# Patient Record
Sex: Male | Born: 1940
Health system: Southern US, Community
[De-identification: ages and names within clinical notes are randomized; demographics above are authoritative.]

## PROBLEM LIST (undated history)

## (undated) DIAGNOSIS — I739 Peripheral vascular disease, unspecified: Principal | ICD-10-CM

## (undated) DIAGNOSIS — K219 Gastro-esophageal reflux disease without esophagitis: Secondary | ICD-10-CM

## (undated) DIAGNOSIS — I2510 Atherosclerotic heart disease of native coronary artery without angina pectoris: Secondary | ICD-10-CM

## (undated) DIAGNOSIS — IMO0002 Reserved for concepts with insufficient information to code with codable children: Secondary | ICD-10-CM

## (undated) DIAGNOSIS — I779 Disorder of arteries and arterioles, unspecified: Secondary | ICD-10-CM

## (undated) DIAGNOSIS — E785 Hyperlipidemia, unspecified: Secondary | ICD-10-CM

## (undated) DIAGNOSIS — R001 Bradycardia, unspecified: Secondary | ICD-10-CM

## (undated) DIAGNOSIS — I10 Essential (primary) hypertension: Secondary | ICD-10-CM

## (undated) DIAGNOSIS — I509 Heart failure, unspecified: Secondary | ICD-10-CM

## (undated) DIAGNOSIS — Z720 Tobacco use: Secondary | ICD-10-CM

## (undated) DIAGNOSIS — B351 Tinea unguium: Secondary | ICD-10-CM

## (undated) DIAGNOSIS — E039 Hypothyroidism, unspecified: Secondary | ICD-10-CM

## (undated) DIAGNOSIS — L719 Rosacea, unspecified: Secondary | ICD-10-CM

## (undated) DIAGNOSIS — J449 Chronic obstructive pulmonary disease, unspecified: Secondary | ICD-10-CM

## (undated) DIAGNOSIS — E669 Obesity, unspecified: Secondary | ICD-10-CM

## (undated) DIAGNOSIS — N183 Chronic kidney disease, stage 3 unspecified: Secondary | ICD-10-CM

## (undated) DIAGNOSIS — Z8719 Personal history of other diseases of the digestive system: Secondary | ICD-10-CM

## (undated) DIAGNOSIS — Z9889 Other specified postprocedural states: Secondary | ICD-10-CM

## (undated) HISTORY — PX: TONSILLECTOMY: SUR1361

## (undated) HISTORY — DX: Bradycardia, unspecified: R00.1

## (undated) HISTORY — DX: Reserved for concepts with insufficient information to code with codable children: IMO0002

## (undated) HISTORY — PX: CARDIAC SURGERY: SHX584

## (undated) HISTORY — DX: Disorder of arteries and arterioles, unspecified: I77.9

## (undated) HISTORY — PX: COLON RESECTION: SHX5231

## (undated) HISTORY — DX: Peripheral vascular disease, unspecified: I73.9

## (undated) HISTORY — PX: APPENDECTOMY: SHX54

## (undated) HISTORY — DX: Tinea unguium: B35.1

## (undated) HISTORY — DX: Rosacea, unspecified: L71.9

## (undated) HISTORY — DX: Gastro-esophageal reflux disease without esophagitis: K21.9

## (undated) HISTORY — DX: Essential (primary) hypertension: I10

## (undated) HISTORY — DX: Atherosclerotic heart disease of native coronary artery without angina pectoris: I25.10

## (undated) HISTORY — DX: Other specified postprocedural states: Z98.890

## (undated) HISTORY — PX: CAROTID ENDARTERECTOMY: SUR193

---

## 2000-06-11 ENCOUNTER — Other Ambulatory Visit: Admission: RE | Admit: 2000-06-11 | Discharge: 2000-06-11 | Payer: Self-pay | Admitting: Plastic Surgery

## 2000-12-14 ENCOUNTER — Inpatient Hospital Stay (HOSPITAL_COMMUNITY): Admission: EM | Admit: 2000-12-14 | Discharge: 2000-12-15 | Payer: Self-pay | Admitting: Emergency Medicine

## 2000-12-15 ENCOUNTER — Encounter: Payer: Self-pay | Admitting: Internal Medicine

## 2001-07-18 ENCOUNTER — Emergency Department (HOSPITAL_COMMUNITY): Admission: EM | Admit: 2001-07-18 | Discharge: 2001-07-18 | Payer: Self-pay

## 2001-08-06 ENCOUNTER — Encounter (INDEPENDENT_AMBULATORY_CARE_PROVIDER_SITE_OTHER): Payer: Self-pay | Admitting: *Deleted

## 2001-08-06 ENCOUNTER — Ambulatory Visit (HOSPITAL_COMMUNITY): Admission: RE | Admit: 2001-08-06 | Discharge: 2001-08-06 | Payer: Self-pay | Admitting: Gastroenterology

## 2002-08-29 HISTORY — PX: CARDIAC CATHETERIZATION: SHX172

## 2002-09-15 DIAGNOSIS — Z9889 Other specified postprocedural states: Secondary | ICD-10-CM

## 2002-09-15 HISTORY — PX: CORONARY ANGIOPLASTY WITH STENT PLACEMENT: SHX49

## 2002-09-15 HISTORY — DX: Other specified postprocedural states: Z98.890

## 2002-09-26 ENCOUNTER — Inpatient Hospital Stay (HOSPITAL_COMMUNITY): Admission: EM | Admit: 2002-09-26 | Discharge: 2002-09-29 | Payer: Self-pay | Admitting: Emergency Medicine

## 2002-10-04 ENCOUNTER — Inpatient Hospital Stay (HOSPITAL_COMMUNITY): Admission: EM | Admit: 2002-10-04 | Discharge: 2002-10-07 | Payer: Self-pay | Admitting: Emergency Medicine

## 2002-10-10 ENCOUNTER — Inpatient Hospital Stay (HOSPITAL_COMMUNITY): Admission: EM | Admit: 2002-10-10 | Discharge: 2002-10-11 | Payer: Self-pay | Admitting: Emergency Medicine

## 2003-04-05 ENCOUNTER — Emergency Department (HOSPITAL_COMMUNITY): Admission: AD | Admit: 2003-04-05 | Discharge: 2003-04-06 | Payer: Self-pay | Admitting: Emergency Medicine

## 2003-09-16 HISTORY — PX: COLON SURGERY: SHX602

## 2003-12-29 ENCOUNTER — Emergency Department (HOSPITAL_COMMUNITY): Admission: EM | Admit: 2003-12-29 | Discharge: 2003-12-29 | Payer: Self-pay | Admitting: Emergency Medicine

## 2004-06-12 ENCOUNTER — Encounter: Admission: RE | Admit: 2004-06-12 | Discharge: 2004-06-12 | Payer: Self-pay | Admitting: Internal Medicine

## 2006-09-15 DIAGNOSIS — IMO0002 Reserved for concepts with insufficient information to code with codable children: Secondary | ICD-10-CM

## 2006-09-15 HISTORY — DX: Reserved for concepts with insufficient information to code with codable children: IMO0002

## 2007-03-29 ENCOUNTER — Encounter: Admission: RE | Admit: 2007-03-29 | Discharge: 2007-03-29 | Payer: Self-pay | Admitting: Internal Medicine

## 2008-09-26 ENCOUNTER — Encounter: Admission: RE | Admit: 2008-09-26 | Discharge: 2008-09-26 | Payer: Self-pay | Admitting: Family Medicine

## 2011-01-31 NOTE — H&P (Signed)
Albert Powell, Albert Powell NO.:  [id number]   MEDICAL RECORD NO.:  [id number]                   PATIENT TYPE:  INP   LOCATION:  1827                                 FACILITY:  [facility]   PHYSICIAN:  Danise Edge, M.D.                DATE OF BIRTH:  1941/06/03   DATE OF ADMISSION:  10/04/2002  DATE OF DISCHARGE:                                HISTORY & PHYSICAL   PROBLEM:  Gastrointestinal bleeding on aspirin and Plavix.   HISTORY:  The patient is a 70 year old male born Aug 29, 1941.  The patient  was admitted to [facility] from September 26, 2002 to September 29, 2002 to treat an acute lower gastrointestinal bleed, probably diverticular,  which stopped spontaneously.  Emergency esophagogastroduodenoscopy performed  during this hospitalization revealed a hiatal hernia and Schatzki's ring but  no signs of upper gastrointestinal bleeding.  The patient's discharge  hemoglobin was 8.4 g.   The patient developed acute nausea without vomiting, borborygmi and large-  volume hematochezia at 10 p.m., October 03, 2002.  He denies hematemesis,  chest pain, dyspnea or anorectal pain.  The patient requires daily aspirin  and Plavix to prevent coronary stent occlusion.   MEDICATION ALLERGIES:  None.   CHRONIC MEDICATIONS:  1. Prilosec 20 mg daily.  2. Aspirin 81 mg daily.  3. Plavix 75 mg daily.  4. Altace 10 mg b.i.d.  5. Zocor 40 mg daily.  6. Nicotine patch.   PAST MEDICAL HISTORY:  Coronary artery disease unassociated with prior  myocardial infarction; hypercholesterolemia; coronary artery stents placed;  September 26, 2002 esophagogastroduodenoscopy revealed hiatal hernia with  Schatzki's ring; hypertension; March 2002, diverticular colonic bleed;  umbilical herniorrhaphy.   HABITS:  The patient quit smoking cigarettes.  He consumes a couple of  glasses of wine most days of the week.   FAMILY HISTORY:  Negative for colon cancer.  His father  developed a  myocardial infarction.  His mother had diverticular bleeding.   SOCIAL HISTORY:  The patient is a married and his wife is at his bedside in  the [facility].   REVIEW OF SYSTEMS:  Despite history of coronary artery disease with coronary  artery stent placement, the patient denies dyspnea or chest pain.  There is  no past history of peptic ulcer disease.  Otherwise, negative review of  systems.   PHYSICAL EXAMINATION:  GENERAL APPEARANCE:  The patient is lying comfortably  on the stretcher in the emergency room.  VITAL SIGNS:  His blood pressure is low at 103/70.  HEENT:  Pale palpebral conjunctivae.  Nonicteric sclerae.  LUNGS:  Clear to auscultation.  CARDIAC:  Regular rhythm without audible murmurs, clicks or rubs.  ABDOMEN:  Soft, flat and nontender.  Abdominal obesity.  RECTAL:  Anorectal exam was not performed.  SKIN:  Skin warm and dry.  EXTREMITIES:  No edema.   ASSESSMENT:  Recurrent gastrointestinal bleeding manifested by hematochezia,  on aspirin and Plavix.                                               Danise Edge, M.D.    MJ/MEDQ  D:  10/04/2002  T:  10/04/2002  Job:  [id number]   cc:   Bernette Redbird, M.D.  [address] [place]  [place], [place] [address]  Fax: [phone]

## 2011-01-31 NOTE — Procedures (Signed)
[place]  Patient:    Albert Powell, Albert Powell Visit Number: [id number] MRN: [id number]          Service Type: END Location: ENDO Attending Physician:  Rich Brave Dictated by:   Florencia Reasons, M.D. Proc. Date: 08/06/01 Admit Date:  08/06/2001   CC:         Dr. Marvis Repress, Soyla Dryer FP   Procedure Report  PROCEDURE:  Colonoscopy with polypectomy.  COLONOSCOPIST:  Florencia Reasons, M.D.  HISTORY OF PRESENT ILLNESS:  This s a 70 year old gentleman who recently had low-grade outpatient GI bleeding felt to be diverticular in origin without significant post-hemorrhagic anemia.  Sigmoidoscopy in association with that bleeding episode which was about 10 days ago showed brown stool in the sigmoid region, and thus it is felt to have been hemorrhoidal in origin since he had prominent internal hemorrhoids.  However, the patient has a longstanding history of diverticular disease with periodic diverticulitis and had what was felt to be a diverticular hemorrhage in the spring of 2002.  FINDINGS:  Sessile polyp in the ascending colon.  Extensive diverticular disease.  PROCEDURE:  The nature and purpose and risks of the procedure have been reviewed with the patient who provided written consent.  Sedation was Fentanyl 50 mcg and Versed 5 mg IV to a level of mild sedation.  Heavy sedation was not administered due to a baseline bradycardia with a heart rate around 42 probably due to his atenolol.  Digital exam of the prostate was inhibited by the patients large body habitus.  The Olympus pediatric adjustable tension video colonoscope was advanced through a very tight sigmoid region with innumerable diverticula, and then with some looping overcome by having the patient in the right lateral decubitus position and applying external abdomen compression.  The cecum was identified by clear visualization of the appendiceal orifice after  which pullback was performed.  There was some slightly uncontrolled pullback in the proximal ascending colon, but it is not felt that any significant lesions would have been missed.  On the way in, I encountered a roughly 1 cm, very flat, sessile, pale polyp in the proximal ascending colon.  I biopsied it several times and there is a little bit of residual tissue fulgurated by the snare and a small piece of tissue was able to be sloughed off, but was never recovered for histologic analysis.  No other polyps were seen and there was no evidence of cancer, colitis, or vascular malformations.  The patient had pancolonic diverticular disease, but was most pronounced in the sigmoid area where there was a great deal of associated muscular thickening.  Retroflexion was not performed in the rectum.  The patient tolerated the procedure well and there were no apparent complications.  IMPRESSION: 1. A 1 cm, sessile, flat polyp biopsied and snared in the proximal ascending   colon. 2. Extensive diverticulosis as described above.  PLAN:  Await pathology on the polyp.  Consider followup colonoscopy in several years if adenomatous in character. Dictated by:   Florencia Reasons, M.D. Attending Physician:  Rich Brave DD:  08/06/01 TD:  08/07/01 Job: [id number]

## 2011-01-31 NOTE — Discharge Summary (Signed)
NAMESABIAN, KUBA NO.:  [id number]   MEDICAL RECORD NO.:  [id number]                   PATIENT TYPE:  INP   LOCATION:  3731                                 FACILITY:  [facility]   PHYSICIAN:  Bernette Redbird, M.D.                DATE OF BIRTH:  1941-07-17   DATE OF ADMISSION:  09/26/2002  DATE OF DISCHARGE:  09/29/2002                                 DISCHARGE SUMMARY   FINAL DIAGNOSES:  1. Acute diverticular hemorrhage (presumed).  2. Post-hemorrhagic anemia.  3. Prior history of diverticular bleeding.  4. Coronary artery disease, status post recent stenting.  5. Hypertension.   CONSULTATIONS:  None.   COMPLICATIONS:  None.   PROCEDURES:  Upper endoscopy.   LABORATORY DATA:  Admission hemoglobin was 10.4, which dropped to 8.4  following hydration and was 8.4 on the day of discharge, white count 6800.  Admission metabolic panel within normal limits except for albumin of 2.8 and  total protein of 5.0 and calcium of 8.3.  Specifically, BUN and creatinine  were normal on admission.   HOSPITAL COURSE:  The patient has a prior history (presumed) diverticular  hemorrhage, most recently about a year and a half ago, requiring  hospitalization on one occasion and an ER visit on another.  He began having  rather profuse bleeding, worse than ever before, on the morning of  admission, and developed presyncopal symptoms without chest pain or  abdominal pain.  He came to the emergency room.  Endoscopy was performed,  since he had recently been started on Plavix and was already on aspirin  (associated with recent coronary stent placement about a month earlier).  The endoscopy showed a small hiatal hernia with an esophageal ring but no  gastritis, erosions or ulcers or other source of bleeding.   The patient was admitted to a telemetry bed and observed.  He never had any  further bleeding or in fact even any bowel movements for the next 72 hours  while in  the hospital.  His diet was advanced and his blood pressure  remained stable, in fact, it was even a little bit high until we got his  Altace dose increased to the same dose he takes at home.   Given the absence of any further bleeding, the patient and I felt he was  clinically stable and ready to go home.   DISCHARGE MEDICATIONS:  The patient will resume all of his previous  outpatient medications including:  1. Plavix 75 mg q.a.m.  2. Aspirin 81 mg q.a.m.  3. Prilosec 20 mg q.a.m.  4. Nicotine patch 21 mg per 24 hours.  5. Altace 10 mg b.i.d.  6. Zocor 40 mg daily.  7. Canasa suppositories p.r.n.   ACTIVITY:  He may engage in light activity as tolerated and has no dietary  restrictions.   DISCHARGE INSTRUCTIONS:  He was carefully instructed to contact us  at the  first sign of any recurrent bleeding, rather than waiting to see if the  bleeding would persist or not, at least during this early post-hemorrhage  period.   FOLLOWUP:  He will follow up with me in the office in five days on January  20th at 12:30 p.m.   CONDITION ON DISCHARGE:  Improved.                                               Bernette Redbird, M.D.    RB/MEDQ  D:  09/29/2002  T:  09/29/2002  Job:  [id number]   cc:   [address] [place], [place]  [address]; Marvis Repress M.D., Fax  [phone]   [address] [place], [place]  [address] Milbert Coulter.D.

## 2011-01-31 NOTE — Discharge Summary (Signed)
NAMEANIL, HAVARD                          ACCOUNT NO.:  [id number]   MEDICAL RECORD NO.:  [id number]                   PATIENT TYPE:  INP   LOCATION:  3733                                 FACILITY:  [facility]   PHYSICIAN:  Bernette Redbird, M.D.                DATE OF BIRTH:  01-Oct-1940   DATE OF ADMISSION:  10/10/2002  DATE OF DISCHARGE:  10/11/2002                                 DISCHARGE SUMMARY   FINAL DIAGNOSES:  1. Recurrent lower GI bleeding, presumably of diverticular origin.  2. Post-hemorrhagic anemia.  3. Coronary artery disease, status post recent stenting.  4. Hypercholesterolemia.  5. Hypertension.   COMPLICATIONS:  None.   CONSULTATIONS:  None.   PROCEDURE:  Transfusion of 3 units of packed cells.   LABORATORY DATA:  Admission hemoglobin was 7.4.  After 2 units of packed red  blood cells, it came up to 8.  BUN 19, creatinine 0.8.   HISTORY OF PRESENT ILLNESS:  The patient was admitted through the emergency  room when he began having hematochezia associated with mild dizziness (no  syncope) at home.  He had just been discharged from the hospital three days  earlier.  In fact, this is his third hospitalization in the past three  weeks.  Please see previous dictated discharge summaries for further  details, but basically, this patient has a history of recurrent lower GI  bleeding, presumably of diverticular origin beginning in 3/02, requiring  hospitalization, but not transfusion at that time.  He had a couple of minor  episodes of bleeding, possibly of hemorrhoidal origin, and not requiring  hospitalization in 11/02.  A colonoscopy around that time showed extensive  diverticulosis, including apparent involvement of the right colon, but also  most especially in the sigmoid and descending colon.  There was also a small  benign non-adenomatous polyp removed at that time.   The patient was then clinically quiescent until he was admitted here for  hematochezia on  09/26/02, almost exactly a month following coronary stent  placement by Dr. Mindi Curling in [place], at which time the patient had  Plavix added to the low-dose aspirin he was already taking.   Because of the type of coronary stent in place, it was felt most prudent to  try to maintain the patient for a while longer on the Plavix as well as the  aspirin, and he stopped bleeding while in the hospital during that  hospitalization, so he was discharged home, but had to be readmitted after  about four days, and on that occasion required 2 units of packed red blood  cells, but again remained clinically stable in the hospital, and was able to  be discharged in a few days, roughly three days ago.  Then, the current  episode of bleeding occurred, prompting re-hospitalization and transfusion  of 3 units.   Because of the recurrent character of the patient's bleeding and his  relatively young age, it is anticipated that he would continue to experience  bleeding episodes in the future.  Moreover, with his coronary disease, he  will probably need life-long aspirin therapy.  For all these reasons,  consideration is being given to an elective operation on his colon to remove  the diverticular disease and thereby hopefully prevent any such future  episodes.  The extensiveness of this operation would depend on the degree of  involvement of the colon with diverticulosis, but at the moment it is  thought that it might require a subtotal abdominal colectomy.   Per the patient and the family request, arrangements have been made for the  patient to see Dr. Rise Mu in the Department of General Surgery at  [facility], who has kindly consented to receive the patient in  transfer from our hospital for further evaluation and consideration for  surgery.  I have spoken today on the telephone with the patient's  cardiologist, Dr. Mindi Curling, who feels that since it has been more then a month  now since the  coronary stent procedure, that the patient may be taken off of  Plavix with a high degree of safety, so his Plavix has been discontinued,  but we will continue aspirin for the time being.  The patient is being  transferred, either later today or tomorrow, to [facility] once a bed assignment has been made.   DISPOSITION:  Transfer to [facility] as noted above.   DISCHARGE MEDICATIONS:  1. Altace 10 mg p.o. b.i.d.  2. Protonix 40 mg p.o. q.a.m.  3. Zocor 40 mg two p.o. daily.  4. Aspirin 81 mg daily (actually his dose was held today).  5. Nicotine patch 21 mg per 24 hours.  6. As noted above, Plavix is being discontinued as of today.   The patient has received 1 unit of packed cells this morning (his fifth unit  over the past several weeks, and the third on this hospitalization), but a  repeat hemoglobin will not be obtained unless the patient remains in the  hospital overnight as opposed to being transferred this afternoon, since it  is anticipated that full blood work will be obtained upon arrival at [facility].  We have initiated a GoLYTELY prep for anticipated colonoscopy to  be performed at [facility], but the patient did develop some nausea, so the  prep has been discontinued as of this time.  The patient is receiving normal  saline IV at 125 cc an hour.   CONDITION ON DISCHARGE:  Improved.                                                Bernette Redbird, M.D.    RB/MEDQ  D:  10/11/2002  T:  10/11/2002  Job:  [id number]   cc:   Marvis Repress  [address]  [place]  [place] [address]  Fax: [phone]   Wonda Olds, M.D.  [address]  [place], [place] [address]   Rise Mu, M.D.  Dept of General Surgery  [facility], [facility]  [place], [place] [address]  fax # [phone]

## 2011-01-31 NOTE — H&P (Signed)
NAMELINDBERG, Albert NO.:  [id number]   MEDICAL RECORD NO.:  [id number]                   PATIENT TYPE:  INP   LOCATION:  1823                                 FACILITY:  [facility]   PHYSICIAN:  Bernette Redbird, M.D.                DATE OF BIRTH:  [birth year]   DATE OF ADMISSION:  09/26/2002  DATE OF DISCHARGE:                                HISTORY & PHYSICAL   This 70 year old gentleman is being admitted to the hospital because of GI  bleeding.   Albert Powell has a known history of diverticular bleeding in March of 2002  which required hospitalization, and also around July of 2002, which was self  limited and did not require hospitalization.  More recently, he has seen  small amounts of rectal bleeding since a couple of coronary stents were  placed about a month ago and he was started on Plavix in addition to the  aspirin that he takes.   With the background, the patient has had five to six episodes of  hematochezia, several of them very large in amount and more than he had ever  seen on past occasions, following a Powell normal bowel movement at 6:30  this morning which was associated with just a small amount of blood.  The  patient became pre-syncopal, with loss of vision, dizziness and weakness,  cold sweat and nausea, but no vomiting, no abdominal pain and no complete  syncope.  No chest pain or trouble breathing.   He came to the emergency room where vital signs were normal, hemoglobin was  10.4, BUN was normal.   The patient underwent upper endoscopy (please see separate dictated report)  and this showed no evidence of any upper tract source of his bleeding  despite his aspirin and Plavix exposure.   PAST MEDICAL HISTORY:  No known allergies.   OUTPATIENT MEDICATIONS:  Include Plavix 75 mg daily; aspirin 81 mg daily;  Prilosec 20 mg daily; nicotine patch started several weeks ago, 21 mg per 24  hours; and Altace b.i.d. (does not recall the  dose).   OPERATIONS:  Status post umbilical hernia repair and the above mentioned  PTCA with stent placement August 29, 2002 by Dr. Mindi Curling in [place].   MEDICAL ILLNESSES:  Include hypertension times ten years and coronary  disease, status post angina with stent placement but no known MI last month.  NO history of diabetes or COPD.   HABITS:  The patient used to smoke but stopped three weeks ago.  Moderate  ethanol, two glasses of wine several days a week.   FAMILY HISTORY:  His father had an MI.  His mother had diverticular  hemorrhage.   SOCIAL HISTORY:  The patient is married and is accompanied by his wife and  daughter.  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  REVIEW OF SYSTEMS:  Basically globally negative.  No problem with cough or  shortness of breath, no abdominal pain, no history of chronic constipation  at this time (uses fiber supplementation).  No urinary difficulties and  notably, no chest pain at this time.   PHYSICAL EXAMINATION:  Blood pressure 118/56 with pulse of 78.  The patient  is a somewhat overweight, very pleasant, healthy-appearing, white male in no  evident distress.  His color is good with perhaps just some slight pallor.  The skin is warm.  Radial pulses are hard to feel.  The chest is clear.  The  heart is normal without gallops, rubs, murmurs, clicks or arrhythmias.  The  abdomen is soft and nontender, without discernable organomegaly, guarding or  masses.  Rectal exam was not performed.   LABORATORY DATA:  White count 7300, hemoglobin 10.4, hematocrit 31,  platelets 203,000, protime 13.3 seconds with an INR of 1.0.  BUN 14,  creatinine 1.1, liver chemistries normal.  X-rays not obtained.   IMPRESSION:  1. Acute gastrointestinal bleeding, probably of lower tract origin with mild     to moderate hemodynamic instability.  2. Post hemorrhagic anemia.  3. Past history of  diverticular bleed.  4. Status post colonoscopy, November 2002 which showed pan colonic     diverticulosis.   PLAN:  1. Continue Plavix for now if at all possible.  I discussed this with his     cardiologist on the telephone and while the Cyber stent is Powell fresh,     it is critical to try to continue the Plavix, even if we have to reduce     to every other day dosing, if at all possible.  Apparently, acute     occlusion can occur if the Plavix is not continued.  2. Hold Altace for now, but re-start if the patient's blood pressure starts     to go up and/or he has established a bleeding-free interval of 24 to 48     hours or so.  3. Continue nicotine patch and Prilosec.  4. Supportive care as for diverticular bleeding, including IV fluids, clear     liquid diet, and if the bleeding continues, surgical consultation.  Will     have blood on stand-by in the event that transfusion is needed.                                               Bernette Redbird, M.D.    RB/MEDQ  D:  09/26/2002  T:  09/26/2002  Job:  [id number]   cc:   Albert Powell  [address]  [place]  [place] [address]  Fax: [phone]   Albert Powell, M.D.  [facility]  [address]  [place], [place]. [address]

## 2011-01-31 NOTE — Op Note (Signed)
NAMELEMOND, GRIFFEE NO.:  [id number]   MEDICAL RECORD NO.:  [id number]                   PATIENT TYPE:  INP   LOCATION:  1823                                 FACILITY:  [facility]   PHYSICIAN:  Bernette Redbird, M.D.                DATE OF BIRTH:  [birth year]   DATE OF PROCEDURE:  09/26/2002  DATE OF DISCHARGE:                                 OPERATIVE REPORT   PROCEDURE PERFORMED:  Upper endoscopy.   ENDOSCOPIST:  Florencia Reasons, M.D.   INDICATIONS FOR PROCEDURE:  The patient is a 70 year old gentleman being  admitted today to the emergency room.  He has had multiple episodes of  fairly large volume hematochezia of bright red blood in the setting of a  prior history of diverticular bleeding and known pancolonic diverticulosis.  However, the patient is maintained on daily aspirin and about a month ago  was also started on Plavix 75 mg daily because two coronary stents were  placed.  Rule out upper tract source of bleeding (doubt).   FINDINGS:  Normal exam except for small hiatal hernia and esophageal ring.   DESCRIPTION OF PROCEDURE:  The nature, purpose and risks of sedation  problems with the procedure were discussed with the patient and he provided  written consent.  He was brought from the emergency department to the  endoscopy unit while awaiting admission to a telemetry bed.  Topical  pharyngeal anesthesia was followed by intravenous sedation with Versed 6 mg  IV.  There was no problem with arrhythmias or desaturation or hypotension  during the course of the exam.  The Olympus small caliber adult video  endoscope was passed under direct vision.  The vocal cords were briefly seen  and appeared grossly normal.  The esophagus was very easily entered and had  normal mucosa throughout its entire length, without evidence of any reflux  esophagitis, Barrett's esophagus, varices, infection, neoplasia or any  Mallory-Weiss tear.  There was a  well-formed fairly widely patent esophageal  ring at the squamocolumnar junction and below this was a 3 cm hiatal hernia.  The stomach was entered.  It contained no blood, just a small amount of  clear fluid.  The gastric mucosa was minimally erythematous in the antral  region but no significant gastritis and certainly no focal lesions such as  erosions, ulcers, polyps or masses were observed despite his exposure to  aspirin and Plavix (note that the patient is also on Prilosec 20 mg daily).  A retroflex view of the stomach was unremarkable as was the pylorus,  duodenal bulb and second duodenum.   The scope was then removed from the patient.  The patient tolerated the  procedure well and there were no apparent complications. No biopsies were  obtained.    IMPRESSION:  1. No source of hematochezia identified on this exam.  Note that there was  amber colored bile in the duodenum.  2. Small hiatal hernia with esophageal ring.   PLAN:  Supportive care for a presumed diverticular hemorrhage.                                                Bernette Redbird, M.D.    RB/MEDQ  D:  09/26/2002  T:  09/26/2002  Job:  [id number]   cc:   Marvis Repress  [address]  [place]  [place] [address]  Fax: [phone]   Wonda Olds, M.D.  [address]  [place] [address]

## 2011-01-31 NOTE — Discharge Summary (Signed)
NAMESHREY, BOIKE                          ACCOUNT NO.:  [id number]   MEDICAL RECORD NO.:  [id number]                   PATIENT TYPE:  INP   LOCATION:  5711                                 FACILITY:  [facility]   PHYSICIAN:  Bernette Redbird, M.D.                DATE OF BIRTH:  1940/11/15   DATE OF ADMISSION:  10/04/2002  DATE OF DISCHARGE:  10/07/2002                                 DISCHARGE SUMMARY   FINAL DIAGNOSES:  1. Recurrent lower gastrointestinal bleeding, presumably of diverticular     origin.  2. Post hemorrhagic anemia.  3. Hypertension.  4. Hypercholesterolemia.  5. Coronary artery disease status post recent stenting.   LABORATORY DATA:  Admission hemoglobin 7.9.  The patient received 2 units of  packed cells and the hemoglobin transiently went up to 9.6 and then drifted  down to 8.3 where it remained for 24 hours and it was 8.3 on the day of  discharge.  White count 9200, platelets 260,000, 50 neutrophils, 34 polys, 9  monocytes.  Metabolic panel showed fasting glucose of 110 (nonfasting  glucose on admission of 185), albumin 3.3.  Liver chemistries normal.   CONSULTATIONS:  None.   COMPLICATIONS:  None.   PROCEDURES:  Transfusion of 2 units of packed cells.   HOSPITAL COURSE:  The patient had been discharged from the hospital  approximately 5 days earlier with what was presumed to be a diverticular  bleed based on a prior history of lower GI bleeding, a previous colonoscopy  having shown multiple diverticula, and a negative endoscopy at the time of  that admission.  He did not require transfusion and was sent home on aspirin  and Plavix which were felt to be important in view of recent coronary stent  placement.   He did well for several days but approximately 5 days post discharge, he  began having hematochezia again and, per our recommendation, promptly  notified our covering physician and proceeded to the emergency room where  his hemoglobin was noted to  be 7.9.  He was admitted to a regular floor bed  and transfused 2 units of packed cells.  The remainder of his  hospitalization, he remained stable, without evidence of further active  bleeding.  He would pass an occasional dark mahogany stool but there was no  evidence that he actually rebled acutely while in the hospital, although his  hemoglobin did drift down post transfusion (attributed to equilibration).   His diet was advanced and on the day of discharge, he had been clinically  stable for days and had had a stable hemoglobin for 24 hours and discharge  was felt to be appropriate.   His blood pressure was not a problem on this hospitalization;  he was  treated with low dose Altace.   DISPOSITION:  The patient is discharged home on a regular diet and with no  specific activity restrictions although it  is advised that he have light  activity as tolerated.  He will follow up with me on a p.r.n. basis since he  anticipates visiting his primary physician's office for followup blood work.  We will request a CBC to be obtained at that visit and also for the patient  to have followup hemoccults which, if positive, may indicate the need for a  repeat colonoscopy (his last colonoscopy was about a year and a half ago).  We also discussed the option of surgical intervention here, electively, to  prevent future diverticular hemorrhages.  He will see Dr. Marilynn Rail in the  surgery department at [facility] approximately a month from now for  that purpose.  The appointment has already been arranged.   DISCHARGE MEDICATIONS:  He will resume his previous outpatient medications  including aspirin and Plavix for the reasons mentioned above.  His discharge  medications include:   1. Prilosec 20 mg daily.  2. Aspirin 81 mg daily.  3. Plavix 75 mg daily.  4. Altace 10 mg b.i.d.  5. Zocor 40 mg daily.  6. Nicotine patch 21 mg/24 hours.   CONDITION ON DISCHARGE:  Improved.                                                 Bernette Redbird, M.D.    RB/MEDQ  D:  10/07/2002  T:  10/08/2002  Job:  [id number]   cc:   Marvis Repress  [address]  [place]  [place] [address]  Fax: [phone]   Wonda Olds, M.D.  [address]  [place], [place]

## 2011-01-31 NOTE — Consult Note (Signed)
[place]. [facility]  Patient:    Albert Powell Visit Number: [id number] MRN: [id number]          Service Type: Attending:  Verlin Grills, M.D. Dictated by:   Verlin Grills, M.D. Proc. Date: 07/18/01   CC:         Florencia Reasons, M.D.   Consultation Report  REASON FOR CONSULTATION:  Painless hematochezia, rule out recurrent colonic diverticular bleed.  HISTORY:  [name] is a 70 year old male who was born March 04, 1941. Albert Powell presents to the emergency Powell with a 24-hour history of recurrent painless hematochezia.  In April 2002 he was hospitalized at [facility] for 72 hour with painless hematochezia; his lower gastrointestinal bleeding resolved spontaneously.  On December 15, 2000 he underwent a diagnostic colonoscopy performed by Dr. Dortha Kern which revealed universal colonic diverticulosis. His discharge hemoglobin on December 15, 2000 was 12 g.  His complete metabolic profile, prothrombin time, and partial thromboplastin time were normal.  Albert Powell enters the emergency Powell feeling quite healthy except for the presence of painless hematochezia.  He denies lightheadedness, dyspnea, weakness, chest pain, vomiting, or nausea.  He is ambulating in the emergency Powell without problems.  He is hemodynamically stable.  MEDICATION ALLERGIES:  None.  CHRONIC MEDICATIONS: 1. Prilosec 20 mg daily to control gastroesophageal reflux. 2. Lotrel 5/20 mg daily. 3. Atenolol 50 mg daily. 4. Aspirin 81 mg daily.  PAST MEDICAL/SURGICAL HISTORY:  Cardiac catheterization performed 10 years ago, did not reveal coronary artery disease.  Umbilical hernia surgery. Uncomplicated hypertension.  Gastroesophageal reflux disease associated with a hiatal hernia.  Universal colonic diverticulosis with painless hematochezia in April 2002, probably a resolved diverticular bleed.  Vocal cord polyps.  HABITS:  Albert Powell consumes  alcohol in moderation.  He is a one pack-per-day cigarette smoker.  FAMILY HISTORY:  Negative for colon cancer.  SOCIAL HISTORY:  Albert Powell is married.  He resides in [place] with his wife.  REVIEW OF SYSTEMS:  CONSTITUTIONAL:  Weight remains stable.  Patient denies fever.  He generally feels healthy.  EYES:  Normal vision.  ENT:  Normal hearing.  RESPIRATORY:  Denies cough, dyspnea, emphysema, asthma. CARDIOVASCULAR:  Denies coronary artery disease, angina pectoris, heart failure.  Patient is on antihypertensive therapy.  GASTROINTESTINAL: Gastroesophageal reflux with hiatal hernia, controlled on Prilosec.  No history of peptic ulcer disease, liver disease, or pancreatic disease. GENITOURINARY:  Patient is voiding normally.  INFECTIOUS DISEASE:  Patient has never required a blood transfusion.  ENDOCRINE:  Patient denies diabetes and thyroid disease.  SKIN:  No cancer.  MUSCULOSKELETAL:  No arthritis. IMMUNIZATION:  Patient received influenza vaccination a couple of weeks ago.  PHYSICAL EXAMINATION:  VITAL SIGNS:  Weight approximately 235 pounds.  Pulse 45 on atenolol.  Blood pressure 185/69.  GENERAL APPEARANCE:  Albert Powell is a healthy-appearing, alert male.  His wife and daughter are at his bedside.  He reports no pain, lightheadedness, or weakness.  He is ambulatory without assistance.  HEENT:  Sclerae nonicteric.  Conjunctivae normal.  Oropharynx normal.  LUNGS:  Clear to auscultation.  CARDIAC:  Regular rhythm without audible murmurs, clicks, or rubs.  ABDOMEN:  Soft, flat, and nontender.  No hepatosplenomegaly.  EXTREMITIES:  Peripheral pulses in the wrists and ankles equal and full bilaterally.  No peripheral edema.  EMERGENCY Powell LABORATORY DATA:  Hemoglobin 13.8 g, white blood cell count normal.  Platelet count 235,000.  Prothrombin time and partial thromboplastin  time normal.  Complete metabolic profile pending.  ASSESSMENT:  Painless hematochezia,  probable recurrent diverticular bleed. Differential diagnosis would also include bleeding from arteriovenous malformations.  PLAN:  Albert Powell would prefer observation at home and not entering the hospital for monitoring.  I think this is a reasonable decision.  He has rapid access back to the emergency Powell if he should develop weakness, lightheadedness, or if he should develop signs of hemodynamically-significant gastrointestinal bleeding.  Assuming Albert Powell has continued non-life-threatening painless hematochezia, I will ask him to follow up with Dr. Bernette Redbird tomorrow morning for a repeat CBC.  Dictated by:   Verlin Grills, M.D. Attending:  Verlin Grills, M.D. DD:  07/18/01 TD:  07/19/01 Job: [id number]

## 2011-01-31 NOTE — H&P (Signed)
NAMELINVILLE, DECAROLIS NO.:  [id number]   MEDICAL RECORD NO.:  [id number]                   PATIENT TYPE:  INP   LOCATION:  1826                                 FACILITY:  [facility]   PHYSICIAN:  Petra Kuba, M.D.                 DATE OF BIRTH:  [birth year]   DATE OF ADMISSION:  10/10/2002  DATE OF DISCHARGE:                                HISTORY & PHYSICAL   HISTORY OF PRESENT ILLNESS:  The patient is well know to my partner, Dr. Nadine Counts  Buccini with two recent episodes of lower GI bleeding, just recently  discharged on January 22, has a surgical appointment with Dr. Marilynn Rail at  [facility] per family request but has not seen him yet.  Unfortunately, had  a stent placed in December requiring long-term aspirin and Plavix.  Had been  in his normal state of health until today when he had a normal bowel  movement this morning and then felt the urge to go like he has recently in  the past and had two bouts of bright red blood and diarrhea.  Based on the  weather, although he did feel some weak and dizzy, called 911 and had the  ambulance take him to the hospital.  He was found to have a hemoglobin of  7.5.  His discharge hemoglobin was 8.4, so there had not been a significant  drop.  In the ambulance, he did have a blood pressure of 70 but is now up to  100 and has no specific complaints like shortness of breath or chest pain  and has not moved his bowels in the last few hours.   PAST MEDICAL HISTORY:  1. Coronary artery disease with an MI and the stent placed recently.  2. Increased cholesterol.  3. An EGD January 12 showed a hiatal hernia with a ring.  4. Hypertension.  5. He had some diverticular bleeding in March 2002 and also repeat bleed in     July and had two colonoscopy at that time showing pan diverticulosis.  6. I am told he also had umbilical hernia surgery but no other surgery.   SOCIAL HISTORY:  Quit tobacco in December, does drink most days.   He does  use the one aspirin a day as above.   FAMILY HISTORY:  Negative for any colon cancer.  His mother, interestingly  enough, did have some diverticular bleeding.   CURRENT MEDICATIONS:  Prilosec, aspirin, Plavix, Altace, Zocor, and  Nicorette patch.   ALLERGIES:  None.   REVIEW OF SYSTEMS:  Negative except as above.   PHYSICAL EXAMINATION:  VITAL SIGNS:  Stable, please see chart.  Afebrile.  GENERAL:  No acute distress.  Silent and pale.  HEENT:  Sclerae nonicteric.  NECK:  Supple without obvious adenopathy.  LUNGS:  Clear.  HEART: Regular rate and rhythm.  ABDOMEN: Soft, nontender.  RECTAL:  Exam  not done.  EXTREMITIES:  No pedal edema, adequate peripheral pulses.   LABORATORY DATA:  Labs pending at time of dictation.   ASSESSMENT:  1. Significant anemia secondary to recurrent diverticular bleeding.  2. Coronary artery disease with stent placement.   PLAN:  Will observe him on telemetry. Go ahead and transfuse 2 units since  his hemoglobin was 7.5.  Follow hemoglobin and hematocrit, number of stools  and color.  Hold aspirin and Plavix tonight but leave up to Dr. Matthias Hughs  whether or not to restart that.  Continue pump inhibitors and clear liquids  only.  Consider surgical appointment with Dr. Marilynn Rail, moving that up per  Dr. Matthias Hughs or urgent surgical consult here if needed.  I have gone over all  the plans with the patient and his wife, and they are in agreement.                                               Petra Kuba, M.D.    MEM/MEDQ  D:  10/10/2002  T:  10/10/2002  Job:  [id number]   cc:   Bernette Redbird, M.D.  [address] [place]  [place], [place] [address]  Fax: [phone]

## 2011-05-07 ENCOUNTER — Encounter (INDEPENDENT_AMBULATORY_CARE_PROVIDER_SITE_OTHER): Payer: Self-pay | Admitting: Surgery

## 2011-05-22 ENCOUNTER — Encounter (INDEPENDENT_AMBULATORY_CARE_PROVIDER_SITE_OTHER): Payer: Self-pay | Admitting: Surgery

## 2011-05-22 ENCOUNTER — Ambulatory Visit (INDEPENDENT_AMBULATORY_CARE_PROVIDER_SITE_OTHER): Payer: Medicare Other | Admitting: Surgery

## 2011-05-22 ENCOUNTER — Other Ambulatory Visit (INDEPENDENT_AMBULATORY_CARE_PROVIDER_SITE_OTHER): Payer: Self-pay | Admitting: Surgery

## 2011-05-22 VITALS — BP 134/82 | HR 68 | Temp 96.4°F | Ht 68.5 in | Wt 232.2 lb

## 2011-05-22 DIAGNOSIS — K432 Incisional hernia without obstruction or gangrene: Secondary | ICD-10-CM

## 2011-05-22 NOTE — Progress Notes (Signed)
Chief Complaint  Patient presents with  . Other    new pt- eval incisional hernia    HPI Albert Powell is a 70 y.o. male. This patient is referred for evaluation of an incisional hernia. In 2004 the patient had a very significant lower GI bleed. He was managed by Dr. Rise Mu at [facility]. Records are unavailable at this time. In discussing it with the patient, it appears that he had a sigmoid colon resection with a descending colostomy. Prior to his colostomy reversal he had a recurrent lower GI bleed. Subsequently he underwent a subtotal colectomy with an ileo-rectal anastomosis. He has had no further GI bleeding since that time. He is no longer on Plavix. Over the last few years he has developed a bulge in his upper abdomen protruding to the right. He denies any obstructive symptoms. This bulge has enlarged but remains reducible when he is supine. He has not had any imaging of this area. In further discussion with the patient, it is possible that the patient has some type of hernia mesh in place. We will attempt to obtain records from Dr. Marilynn Rail. HPI  Past Medical History  Diagnosis Date  . Onychomycosis   . CAD (coronary artery disease)   . GERD (gastroesophageal reflux disease)   . History of colonoscopy 2004    finding of tics and AMV only no polpys  . DDD (degenerative disc disease) 2008  . Cataract   . Diabetes mellitus   . Rosacea   . Hypertension     Past Surgical History  Procedure Date  . Coronary angioplasty with stent placement 09/2002    2 stents  . Colon surgery 2005    renal pouch rectal anastimosis    Family History  Problem Relation Age of Onset  . Heart disease Father     heart attack    Social History History  Substance Use Topics  . Smoking status: Current Everyday Smoker -- 1.0 packs/day  . Smokeless tobacco: Former Neurosurgeon    Quit date: 10/04/2010  . Alcohol Use: 0.0 oz/week    Allergies  Allergen Reactions  . Fentanyl     . Gabapentin   . Lisinopril   . Lyrica (Pregabalin)     Current Outpatient Prescriptions  Medication Sig Dispense Refill  . amLODipine (NORVASC) 5 MG tablet Take 5 mg by mouth daily.        [name] aspirin 81 MG tablet Take 81 mg by mouth daily.        [name] buPROPion (ZYBAN) 150 MG 12 hr tablet Take 150 mg by mouth 2 (two) times daily.        [name] ezetimibe-simvastatin (VYTORIN) 10-20 MG per tablet Take 1 tablet by mouth at bedtime.        [name] glucose blood test strip 1 each by Other route as needed. Use as instructed       . glucose monitoring kit (FREESTYLE) monitoring kit 1 each by Does not apply route as needed.        [name] HYDROcodone-acetaminophen (VICODIN) 5-500 MG per tablet Take 1 tablet by mouth every 6 (six) hours as needed.        [name] losartan-hydrochlorothiazide (HYZAAR) 100-12.5 MG per tablet Take 1 tablet by mouth daily.        . metroNIDAZOLE (METROCREAM) 0.75 % cream Apply topically 2 (two) times daily.        [name] omeprazole (PRILOSEC) 20 MG capsule Take 20 mg by mouth daily.  Review of Systems Review of Systems  Blood pressure 134/82, pulse 68, temperature 96.4 F (35.8 C), temperature source Temporal, height 5' 8.5" (1.74 m), weight 232 lb 3.2 oz (105.325 kg). ROS positive for nasal congestion, cough Physical Exam Physical Exam  Data Reviewed   Assessment    Ventral incisional hernia. This may possibly be a recurrent hernia with existing mesh in place.    Plan    We will obtain a CT scan of the abdomen and pelvis to better defined the size of his hernia and the involved organs. We will also try to obtain medical records from [facility]. We will reevaluate the patient in 2 weeks to discuss the findings. He will likely need a large open incisional repair with mesh.       Dessiree Sze K. 05/22/2011, 10:49 AM

## 2011-05-23 ENCOUNTER — Ambulatory Visit
Admission: RE | Admit: 2011-05-23 | Discharge: 2011-05-23 | Disposition: A | Discharge status: Home / Self Care | Payer: Medicare Other | Source: Ambulatory Visit | Attending: Surgery | Admitting: Surgery

## 2011-05-23 DIAGNOSIS — K432 Incisional hernia without obstruction or gangrene: Secondary | ICD-10-CM

## 2011-05-23 LAB — BUN: BUN: 19 mg/dL (ref 6–23)

## 2011-05-23 LAB — CREATININE, SERUM: Creat: 0.99 mg/dL (ref 0.50–1.35)

## 2011-05-23 MED ORDER — IOHEXOL 300 MG/ML  SOLN
125.0000 mL | Freq: Once | INTRAMUSCULAR | Status: AC | PRN
  Administered 2011-05-23: 125 mL via INTRAVENOUS

## 2011-06-03 ENCOUNTER — Telehealth (INDEPENDENT_AMBULATORY_CARE_PROVIDER_SITE_OTHER): Payer: Self-pay | Admitting: General Surgery

## 2011-06-03 ENCOUNTER — Encounter (INDEPENDENT_AMBULATORY_CARE_PROVIDER_SITE_OTHER): Payer: Medicare Other | Admitting: Surgery

## 2011-06-03 ENCOUNTER — Encounter (INDEPENDENT_AMBULATORY_CARE_PROVIDER_SITE_OTHER): Payer: Self-pay | Admitting: Surgery

## 2011-06-03 ENCOUNTER — Ambulatory Visit (INDEPENDENT_AMBULATORY_CARE_PROVIDER_SITE_OTHER): Payer: Medicare Other | Admitting: Surgery

## 2011-06-03 DIAGNOSIS — K432 Incisional hernia without obstruction or gangrene: Secondary | ICD-10-CM

## 2011-06-03 NOTE — Telephone Encounter (Signed)
Albert Powell has a appt with Dr Barnetta Chapel on Oct 25,2012 at 11:30am and pt is aware of the appt

## 2011-06-03 NOTE — Progress Notes (Signed)
The patient returns after his CT scan.  We were able to obtain records from Dr. Graylon Good office.  The patient underwent a sigmoid colectomy with descending colostomy in early 2004 for a presumed diverticular bleed.  He had a superficial wound infection after that surgery.  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  Subsequently, he underwent a completion subtotal colectomy with ileorectal anastomosis.  He also had a concomitant ventral hernia repair, bridging the upper fascia with Alloderm.  He had another wound infection after that surgery.  He has subsequently developed a large abdominal wall defect.  The CT scan confirmed that the bowel is obviously involved in some of the hernia defects, but there is no sign of obstruction.  The patient has multiple risk factors for recurrence with any possible hernia repair - previous hernia, diabetes, obesity, smoking 1 ppd, current use of prednisone, previous wound infections.  I explained to him that his situation would be better suited at a tertiary facility, and since his previous surgery was done at [facility], he should be reevaluated there for possible hernia repair.  We will send his information to Dr. Barnetta Chapel to schedule a consultation.

## 2011-06-03 NOTE — Patient Instructions (Signed)
We will refer you to Dr. Barnetta Chapel at [facility] to discuss possible hernia surgery.  In the meantime, continue a regular exercise regimen, cut back on the cigarettes.

## 2011-06-10 ENCOUNTER — Telehealth (INDEPENDENT_AMBULATORY_CARE_PROVIDER_SITE_OTHER): Payer: Self-pay | Admitting: Surgery

## 2011-06-10 NOTE — Telephone Encounter (Signed)
Albert Powell called today and wanted to talk about the CT Scan of the Abdomen and Pelvis on 05-23-11.[name] He wanted to know about the Left Adrenal Adenoma stated in the impression part of the CT. And the pt wanted to know if he needed to see a different Dr for the Adrenal Adenoma. Pt is aware that it maybe tomorrow before a phone call. Please call pt on his cell [phone]

## 2011-06-10 NOTE — Telephone Encounter (Signed)
Called Mr. Gleed back on about the Adrenal mass and pt is ok about talking to Dr Barnetta Chapel

## 2011-06-10 NOTE — Telephone Encounter (Signed)
Called Mr Fohl back today and talked to him about his CT Scan and told Mr Payette that, I will send a note to Dr Corliss Skains to call pt about the CT Scan

## 2011-06-10 NOTE — Telephone Encounter (Signed)
This adrenal mass is an incidental finding.  He can speak with Dr. Barnetta Chapel about it when he has his consultation at [facility].

## 2011-07-04 ENCOUNTER — Ambulatory Visit (HOSPITAL_COMMUNITY)
Admission: RE | Admit: 2011-07-04 | Discharge: 2011-07-04 | Disposition: A | Discharge status: Home / Self Care | Payer: Medicare Other | Source: Ambulatory Visit | Attending: Internal Medicine | Admitting: Internal Medicine

## 2011-07-04 DIAGNOSIS — J449 Chronic obstructive pulmonary disease, unspecified: Secondary | ICD-10-CM | POA: Insufficient documentation

## 2011-07-04 DIAGNOSIS — J4489 Other specified chronic obstructive pulmonary disease: Secondary | ICD-10-CM | POA: Insufficient documentation

## 2011-07-10 DIAGNOSIS — I10 Essential (primary) hypertension: Secondary | ICD-10-CM | POA: Insufficient documentation

## 2011-10-20 DIAGNOSIS — G894 Chronic pain syndrome: Secondary | ICD-10-CM | POA: Diagnosis not present

## 2011-10-20 DIAGNOSIS — M48062 Spinal stenosis, lumbar region with neurogenic claudication: Secondary | ICD-10-CM | POA: Diagnosis not present

## 2011-10-20 DIAGNOSIS — M5137 Other intervertebral disc degeneration, lumbosacral region: Secondary | ICD-10-CM | POA: Diagnosis not present

## 2011-10-20 DIAGNOSIS — IMO0002 Reserved for concepts with insufficient information to code with codable children: Secondary | ICD-10-CM | POA: Diagnosis not present

## 2011-11-06 DIAGNOSIS — H2510 Age-related nuclear cataract, unspecified eye: Secondary | ICD-10-CM | POA: Diagnosis not present

## 2011-11-07 DIAGNOSIS — F341 Dysthymic disorder: Secondary | ICD-10-CM | POA: Diagnosis not present

## 2011-11-07 DIAGNOSIS — M545 Low back pain: Secondary | ICD-10-CM | POA: Diagnosis not present

## 2011-11-07 DIAGNOSIS — F4542 Pain disorder with related psychological factors: Secondary | ICD-10-CM | POA: Diagnosis not present

## 2011-11-10 DIAGNOSIS — J449 Chronic obstructive pulmonary disease, unspecified: Secondary | ICD-10-CM | POA: Diagnosis not present

## 2011-11-10 DIAGNOSIS — F329 Major depressive disorder, single episode, unspecified: Secondary | ICD-10-CM | POA: Diagnosis not present

## 2011-11-10 DIAGNOSIS — Z0000 Encounter for general adult medical examination without abnormal findings: Secondary | ICD-10-CM | POA: Diagnosis not present

## 2011-11-10 DIAGNOSIS — I10 Essential (primary) hypertension: Secondary | ICD-10-CM | POA: Diagnosis not present

## 2011-11-10 DIAGNOSIS — M4800 Spinal stenosis, site unspecified: Secondary | ICD-10-CM | POA: Diagnosis not present

## 2011-11-10 DIAGNOSIS — E119 Type 2 diabetes mellitus without complications: Secondary | ICD-10-CM | POA: Diagnosis not present

## 2011-11-24 DIAGNOSIS — L821 Other seborrheic keratosis: Secondary | ICD-10-CM | POA: Diagnosis not present

## 2011-11-24 DIAGNOSIS — L820 Inflamed seborrheic keratosis: Secondary | ICD-10-CM | POA: Diagnosis not present

## 2011-11-28 DIAGNOSIS — I10 Essential (primary) hypertension: Secondary | ICD-10-CM | POA: Diagnosis not present

## 2011-11-28 DIAGNOSIS — Z7901 Long term (current) use of anticoagulants: Secondary | ICD-10-CM | POA: Diagnosis not present

## 2011-11-28 DIAGNOSIS — N289 Disorder of kidney and ureter, unspecified: Secondary | ICD-10-CM | POA: Diagnosis not present

## 2011-11-28 DIAGNOSIS — R7989 Other specified abnormal findings of blood chemistry: Secondary | ICD-10-CM | POA: Diagnosis not present

## 2011-12-02 DIAGNOSIS — G894 Chronic pain syndrome: Secondary | ICD-10-CM | POA: Diagnosis not present

## 2011-12-02 DIAGNOSIS — M545 Low back pain: Secondary | ICD-10-CM | POA: Diagnosis not present

## 2011-12-02 DIAGNOSIS — IMO0002 Reserved for concepts with insufficient information to code with codable children: Secondary | ICD-10-CM | POA: Diagnosis not present

## 2011-12-02 DIAGNOSIS — M48062 Spinal stenosis, lumbar region with neurogenic claudication: Secondary | ICD-10-CM | POA: Diagnosis not present

## 2011-12-02 DIAGNOSIS — M5137 Other intervertebral disc degeneration, lumbosacral region: Secondary | ICD-10-CM | POA: Diagnosis not present

## 2011-12-02 DIAGNOSIS — M47817 Spondylosis without myelopathy or radiculopathy, lumbosacral region: Secondary | ICD-10-CM | POA: Diagnosis not present

## 2012-01-08 DIAGNOSIS — M549 Dorsalgia, unspecified: Secondary | ICD-10-CM | POA: Diagnosis not present

## 2012-01-08 DIAGNOSIS — G8929 Other chronic pain: Secondary | ICD-10-CM | POA: Diagnosis not present

## 2012-01-14 DIAGNOSIS — IMO0002 Reserved for concepts with insufficient information to code with codable children: Secondary | ICD-10-CM | POA: Diagnosis not present

## 2012-01-14 DIAGNOSIS — G8929 Other chronic pain: Secondary | ICD-10-CM | POA: Diagnosis not present

## 2012-01-14 DIAGNOSIS — M549 Dorsalgia, unspecified: Secondary | ICD-10-CM | POA: Diagnosis not present

## 2012-01-15 DIAGNOSIS — I2510 Atherosclerotic heart disease of native coronary artery without angina pectoris: Secondary | ICD-10-CM | POA: Diagnosis not present

## 2012-01-15 DIAGNOSIS — Z9861 Coronary angioplasty status: Secondary | ICD-10-CM | POA: Diagnosis not present

## 2012-01-15 DIAGNOSIS — G8929 Other chronic pain: Secondary | ICD-10-CM | POA: Diagnosis not present

## 2012-01-15 DIAGNOSIS — K219 Gastro-esophageal reflux disease without esophagitis: Secondary | ICD-10-CM | POA: Diagnosis not present

## 2012-01-15 DIAGNOSIS — E119 Type 2 diabetes mellitus without complications: Secondary | ICD-10-CM | POA: Diagnosis not present

## 2012-01-15 DIAGNOSIS — IMO0002 Reserved for concepts with insufficient information to code with codable children: Secondary | ICD-10-CM | POA: Diagnosis not present

## 2012-01-15 DIAGNOSIS — Z951 Presence of aortocoronary bypass graft: Secondary | ICD-10-CM | POA: Diagnosis not present

## 2012-01-15 DIAGNOSIS — I498 Other specified cardiac arrhythmias: Secondary | ICD-10-CM | POA: Diagnosis not present

## 2012-01-15 DIAGNOSIS — J449 Chronic obstructive pulmonary disease, unspecified: Secondary | ICD-10-CM | POA: Diagnosis not present

## 2012-01-15 DIAGNOSIS — I10 Essential (primary) hypertension: Secondary | ICD-10-CM | POA: Diagnosis not present

## 2012-01-21 DIAGNOSIS — I10 Essential (primary) hypertension: Secondary | ICD-10-CM | POA: Diagnosis not present

## 2012-01-21 DIAGNOSIS — G8929 Other chronic pain: Secondary | ICD-10-CM | POA: Diagnosis not present

## 2012-01-21 DIAGNOSIS — M549 Dorsalgia, unspecified: Secondary | ICD-10-CM | POA: Diagnosis not present

## 2012-01-21 DIAGNOSIS — Z9282 Status post administration of tPA (rtPA) in a different facility within the last 24 hours prior to admission to current facility: Secondary | ICD-10-CM | POA: Diagnosis not present

## 2012-01-21 DIAGNOSIS — E119 Type 2 diabetes mellitus without complications: Secondary | ICD-10-CM | POA: Diagnosis not present

## 2012-01-21 DIAGNOSIS — J449 Chronic obstructive pulmonary disease, unspecified: Secondary | ICD-10-CM | POA: Diagnosis not present

## 2012-01-21 DIAGNOSIS — M546 Pain in thoracic spine: Secondary | ICD-10-CM | POA: Diagnosis not present

## 2012-01-21 DIAGNOSIS — I2510 Atherosclerotic heart disease of native coronary artery without angina pectoris: Secondary | ICD-10-CM | POA: Diagnosis not present

## 2012-01-21 DIAGNOSIS — Z0189 Encounter for other specified special examinations: Secondary | ICD-10-CM | POA: Diagnosis not present

## 2012-01-21 DIAGNOSIS — IMO0002 Reserved for concepts with insufficient information to code with codable children: Secondary | ICD-10-CM | POA: Diagnosis not present

## 2012-01-21 DIAGNOSIS — R52 Pain, unspecified: Secondary | ICD-10-CM | POA: Diagnosis not present

## 2012-02-16 DIAGNOSIS — M545 Low back pain, unspecified: Secondary | ICD-10-CM | POA: Diagnosis not present

## 2012-02-16 DIAGNOSIS — G8929 Other chronic pain: Secondary | ICD-10-CM | POA: Diagnosis not present

## 2012-03-11 DIAGNOSIS — J449 Chronic obstructive pulmonary disease, unspecified: Secondary | ICD-10-CM | POA: Diagnosis not present

## 2012-03-11 DIAGNOSIS — I10 Essential (primary) hypertension: Secondary | ICD-10-CM | POA: Diagnosis not present

## 2012-03-11 DIAGNOSIS — E119 Type 2 diabetes mellitus without complications: Secondary | ICD-10-CM | POA: Diagnosis not present

## 2012-04-21 DIAGNOSIS — E782 Mixed hyperlipidemia: Secondary | ICD-10-CM | POA: Diagnosis not present

## 2012-04-21 DIAGNOSIS — I2510 Atherosclerotic heart disease of native coronary artery without angina pectoris: Secondary | ICD-10-CM | POA: Diagnosis not present

## 2012-04-21 DIAGNOSIS — J449 Chronic obstructive pulmonary disease, unspecified: Secondary | ICD-10-CM | POA: Diagnosis not present

## 2012-04-21 DIAGNOSIS — I10 Essential (primary) hypertension: Secondary | ICD-10-CM | POA: Diagnosis not present

## 2012-06-23 DIAGNOSIS — Z23 Encounter for immunization: Secondary | ICD-10-CM | POA: Diagnosis not present

## 2012-07-06 DIAGNOSIS — E119 Type 2 diabetes mellitus without complications: Secondary | ICD-10-CM | POA: Diagnosis not present

## 2012-07-06 DIAGNOSIS — I10 Essential (primary) hypertension: Secondary | ICD-10-CM | POA: Diagnosis not present

## 2012-07-06 DIAGNOSIS — J449 Chronic obstructive pulmonary disease, unspecified: Secondary | ICD-10-CM | POA: Diagnosis not present

## 2012-09-03 DIAGNOSIS — M949 Disorder of cartilage, unspecified: Secondary | ICD-10-CM | POA: Diagnosis not present

## 2012-09-03 DIAGNOSIS — M899 Disorder of bone, unspecified: Secondary | ICD-10-CM | POA: Diagnosis not present

## 2012-09-03 DIAGNOSIS — M545 Low back pain: Secondary | ICD-10-CM | POA: Diagnosis not present

## 2012-09-09 DIAGNOSIS — M545 Low back pain: Secondary | ICD-10-CM | POA: Diagnosis not present

## 2012-09-09 DIAGNOSIS — M899 Disorder of bone, unspecified: Secondary | ICD-10-CM | POA: Diagnosis not present

## 2012-09-15 HISTORY — PX: CARDIAC CATHETERIZATION: SHX172

## 2012-09-24 DIAGNOSIS — M545 Low back pain: Secondary | ICD-10-CM | POA: Diagnosis not present

## 2012-09-24 DIAGNOSIS — M949 Disorder of cartilage, unspecified: Secondary | ICD-10-CM | POA: Diagnosis not present

## 2012-09-24 DIAGNOSIS — M899 Disorder of bone, unspecified: Secondary | ICD-10-CM | POA: Diagnosis not present

## 2012-10-07 DIAGNOSIS — M949 Disorder of cartilage, unspecified: Secondary | ICD-10-CM | POA: Diagnosis not present

## 2012-10-07 DIAGNOSIS — M545 Low back pain: Secondary | ICD-10-CM | POA: Diagnosis not present

## 2012-11-10 DIAGNOSIS — M25519 Pain in unspecified shoulder: Secondary | ICD-10-CM | POA: Diagnosis not present

## 2012-11-10 DIAGNOSIS — E119 Type 2 diabetes mellitus without complications: Secondary | ICD-10-CM | POA: Diagnosis not present

## 2012-11-10 DIAGNOSIS — Z0000 Encounter for general adult medical examination without abnormal findings: Secondary | ICD-10-CM | POA: Diagnosis not present

## 2012-11-10 DIAGNOSIS — J449 Chronic obstructive pulmonary disease, unspecified: Secondary | ICD-10-CM | POA: Diagnosis not present

## 2012-11-10 DIAGNOSIS — F17200 Nicotine dependence, unspecified, uncomplicated: Secondary | ICD-10-CM | POA: Diagnosis not present

## 2012-11-10 DIAGNOSIS — I10 Essential (primary) hypertension: Secondary | ICD-10-CM | POA: Diagnosis not present

## 2012-11-29 DIAGNOSIS — E119 Type 2 diabetes mellitus without complications: Secondary | ICD-10-CM | POA: Diagnosis not present

## 2012-12-10 DIAGNOSIS — Z1211 Encounter for screening for malignant neoplasm of colon: Secondary | ICD-10-CM | POA: Diagnosis not present

## 2012-12-27 DIAGNOSIS — H60399 Other infective otitis externa, unspecified ear: Secondary | ICD-10-CM | POA: Diagnosis not present

## 2013-02-04 ENCOUNTER — Telehealth: Payer: Self-pay | Admitting: Cardiovascular Disease

## 2013-02-04 ENCOUNTER — Emergency Department (HOSPITAL_COMMUNITY): Payer: Medicare Other

## 2013-02-04 ENCOUNTER — Encounter (HOSPITAL_COMMUNITY): Payer: Self-pay | Admitting: Cardiology

## 2013-02-04 ENCOUNTER — Observation Stay (HOSPITAL_COMMUNITY)
Admission: EM | Admit: 2013-02-04 | Discharge: 2013-02-08 | Disposition: A | Discharge status: Home / Self Care | Payer: Medicare Other | Attending: Internal Medicine | Admitting: Internal Medicine

## 2013-02-04 DIAGNOSIS — J438 Other emphysema: Secondary | ICD-10-CM | POA: Diagnosis not present

## 2013-02-04 DIAGNOSIS — Z79899 Other long term (current) drug therapy: Secondary | ICD-10-CM | POA: Insufficient documentation

## 2013-02-04 DIAGNOSIS — Z9861 Coronary angioplasty status: Secondary | ICD-10-CM | POA: Diagnosis not present

## 2013-02-04 DIAGNOSIS — R079 Chest pain, unspecified: Secondary | ICD-10-CM | POA: Diagnosis not present

## 2013-02-04 DIAGNOSIS — K219 Gastro-esophageal reflux disease without esophagitis: Secondary | ICD-10-CM

## 2013-02-04 DIAGNOSIS — R0789 Other chest pain: Secondary | ICD-10-CM | POA: Diagnosis not present

## 2013-02-04 DIAGNOSIS — F17200 Nicotine dependence, unspecified, uncomplicated: Secondary | ICD-10-CM | POA: Diagnosis not present

## 2013-02-04 DIAGNOSIS — I10 Essential (primary) hypertension: Secondary | ICD-10-CM | POA: Diagnosis not present

## 2013-02-04 DIAGNOSIS — E785 Hyperlipidemia, unspecified: Secondary | ICD-10-CM | POA: Diagnosis present

## 2013-02-04 DIAGNOSIS — K432 Incisional hernia without obstruction or gangrene: Secondary | ICD-10-CM | POA: Diagnosis present

## 2013-02-04 DIAGNOSIS — E119 Type 2 diabetes mellitus without complications: Secondary | ICD-10-CM | POA: Insufficient documentation

## 2013-02-04 DIAGNOSIS — R0602 Shortness of breath: Secondary | ICD-10-CM | POA: Insufficient documentation

## 2013-02-04 DIAGNOSIS — I2510 Atherosclerotic heart disease of native coronary artery without angina pectoris: Principal | ICD-10-CM | POA: Diagnosis present

## 2013-02-04 DIAGNOSIS — I200 Unstable angina: Secondary | ICD-10-CM | POA: Insufficient documentation

## 2013-02-04 DIAGNOSIS — I252 Old myocardial infarction: Secondary | ICD-10-CM | POA: Diagnosis not present

## 2013-02-04 DIAGNOSIS — E782 Mixed hyperlipidemia: Secondary | ICD-10-CM | POA: Diagnosis present

## 2013-02-04 DIAGNOSIS — N1832 Chronic kidney disease, stage 3b: Secondary | ICD-10-CM | POA: Diagnosis present

## 2013-02-04 DIAGNOSIS — Z720 Tobacco use: Secondary | ICD-10-CM | POA: Diagnosis present

## 2013-02-04 DIAGNOSIS — R6884 Jaw pain: Secondary | ICD-10-CM | POA: Insufficient documentation

## 2013-02-04 HISTORY — DX: Tobacco use: Z72.0

## 2013-02-04 HISTORY — DX: Hyperlipidemia, unspecified: E78.5

## 2013-02-04 HISTORY — DX: Chronic obstructive pulmonary disease, unspecified: J44.9

## 2013-02-04 LAB — POCT I-STAT TROPONIN I: Troponin i, poc: 0.06 ng/mL (ref 0.00–0.08)

## 2013-02-04 LAB — BASIC METABOLIC PANEL
CO2: 29 mEq/L (ref 19–32)
Chloride: 98 mEq/L (ref 96–112)
GFR calc non Af Amer: 86 mL/min — ABNORMAL LOW (ref >90)
Glucose, Bld: 91 mg/dL (ref 70–99)
Potassium: 4.2 mEq/L (ref 3.5–5.1)
Sodium: 137 mEq/L (ref 135–145)

## 2013-02-04 LAB — CBC
HCT: 43.3 % (ref 39.0–52.0)
Hemoglobin: 15.1 g/dL (ref 13.0–17.0)
Hemoglobin: 13.6 g/dL (ref 13.0–17.0)
MCH: 31.1 pg (ref 26.0–34.0)
Platelets: 210 10*3/uL (ref 150–400)
RBC: 4.79 MIL/uL (ref 4.22–5.81)
RBC: 4.38 MIL/uL (ref 4.22–5.81)
WBC: 11 10*3/uL — ABNORMAL HIGH (ref 4.0–10.5)

## 2013-02-04 MED ORDER — SODIUM CHLORIDE 0.9 % IV SOLN: INTRAVENOUS | Status: DC

## 2013-02-04 MED ORDER — METOPROLOL TARTRATE 1 MG/ML IV SOLN
5.0000 mg | Freq: Once | INTRAVENOUS | Status: AC
  Administered 2013-02-04: 5 mg via INTRAVENOUS
  Filled 2013-02-04: qty 5

## 2013-02-04 MED ORDER — LOSARTAN POTASSIUM 50 MG PO TABS
100.0000 mg | ORAL_TABLET | Freq: Every day | ORAL | Status: DC
  Administered 2013-02-05 – 2013-02-08 (×4): 100 mg via ORAL
  Filled 2013-02-04 (×4): qty 2

## 2013-02-04 MED ORDER — ENOXAPARIN SODIUM 40 MG/0.4ML ~~LOC~~ SOLN
40.0000 mg | SUBCUTANEOUS | Status: DC
  Administered 2013-02-04: 40 mg via SUBCUTANEOUS
  Filled 2013-02-04 (×2): qty 0.4

## 2013-02-04 MED ORDER — ONDANSETRON HCL 4 MG/2ML IJ SOLN: 4.0000 mg | Freq: Three times a day (TID) | INTRAMUSCULAR | Status: DC | PRN

## 2013-02-04 MED ORDER — ATORVASTATIN CALCIUM 40 MG PO TABS
40.0000 mg | ORAL_TABLET | Freq: Every day | ORAL | Status: DC
  Administered 2013-02-05 – 2013-02-08 (×4): 40 mg via ORAL
  Filled 2013-02-04 (×4): qty 1

## 2013-02-04 MED ORDER — NITROGLYCERIN 0.4 MG SL SUBL: 0.4000 mg | SUBLINGUAL_TABLET | SUBLINGUAL | Status: DC | PRN

## 2013-02-04 MED ORDER — HYDROMORPHONE HCL PF 1 MG/ML IJ SOLN: 1.0000 mg | INTRAMUSCULAR | Status: DC | PRN

## 2013-02-04 MED ORDER — LOSARTAN POTASSIUM-HCTZ 100-25 MG PO TABS: 1.0000 | ORAL_TABLET | Freq: Every day | ORAL | Status: DC

## 2013-02-04 MED ORDER — ACETAMINOPHEN 325 MG PO TABS: 650.0000 mg | ORAL_TABLET | Freq: Four times a day (QID) | ORAL | Status: DC | PRN

## 2013-02-04 MED ORDER — HYDRALAZINE HCL 20 MG/ML IJ SOLN
10.0000 mg | INTRAMUSCULAR | Status: DC | PRN
  Administered 2013-02-04 – 2013-02-05 (×3): 10 mg via INTRAVENOUS
  Filled 2013-02-04 (×3): qty 1

## 2013-02-04 MED ORDER — ACETAMINOPHEN 650 MG RE SUPP: 650.0000 mg | Freq: Four times a day (QID) | RECTAL | Status: DC | PRN

## 2013-02-04 MED ORDER — METRONIDAZOLE 0.75 % EX GEL
Freq: Two times a day (BID) | CUTANEOUS | Status: DC
  Administered 2013-02-05 – 2013-02-07 (×5): via TOPICAL
  Filled 2013-02-04: qty 45

## 2013-02-04 MED ORDER — HYDROCHLOROTHIAZIDE 25 MG PO TABS
25.0000 mg | ORAL_TABLET | Freq: Every day | ORAL | Status: DC
  Administered 2013-02-05 – 2013-02-07 (×3): 25 mg via ORAL
  Filled 2013-02-04 (×5): qty 1

## 2013-02-04 MED ORDER — ASPIRIN EC 325 MG PO TBEC
325.0000 mg | DELAYED_RELEASE_TABLET | Freq: Every day | ORAL | Status: DC
  Administered 2013-02-05 – 2013-02-07 (×3): 325 mg via ORAL
  Filled 2013-02-04 (×3): qty 1

## 2013-02-04 MED ORDER — ONDANSETRON HCL 4 MG/2ML IJ SOLN: 4.0000 mg | Freq: Four times a day (QID) | INTRAMUSCULAR | Status: DC | PRN

## 2013-02-04 MED ORDER — ONDANSETRON HCL 4 MG PO TABS: 4.0000 mg | ORAL_TABLET | Freq: Four times a day (QID) | ORAL | Status: DC | PRN

## 2013-02-04 MED ORDER — AMLODIPINE BESYLATE 5 MG PO TABS
5.0000 mg | ORAL_TABLET | Freq: Every day | ORAL | Status: DC
  Administered 2013-02-05: 5 mg via ORAL
  Filled 2013-02-04 (×2): qty 1

## 2013-02-04 MED ORDER — MOMETASONE FURO-FORMOTEROL FUM 100-5 MCG/ACT IN AERO
2.0000 | INHALATION_SPRAY | Freq: Two times a day (BID) | RESPIRATORY_TRACT | Status: DC
  Administered 2013-02-05 – 2013-02-08 (×6): 2 via RESPIRATORY_TRACT
  Filled 2013-02-04: qty 8.8

## 2013-02-04 MED ORDER — PANTOPRAZOLE SODIUM 40 MG PO TBEC
40.0000 mg | DELAYED_RELEASE_TABLET | Freq: Every day | ORAL | Status: DC
  Administered 2013-02-05 – 2013-02-08 (×4): 40 mg via ORAL
  Filled 2013-02-04 (×3): qty 1

## 2013-02-04 MED ORDER — SODIUM CHLORIDE 0.9 % IJ SOLN
3.0000 mL | Freq: Two times a day (BID) | INTRAMUSCULAR | Status: DC
  Administered 2013-02-05 – 2013-02-06 (×4): 3 mL via INTRAVENOUS

## 2013-02-04 MED ORDER — INSULIN ASPART 100 UNIT/ML ~~LOC~~ SOLN
0.0000 [IU] | Freq: Three times a day (TID) | SUBCUTANEOUS | Status: DC
  Administered 2013-02-05: 1 [IU] via SUBCUTANEOUS
  Administered 2013-02-05 – 2013-02-07 (×3): 2 [IU] via SUBCUTANEOUS

## 2013-02-04 MED ORDER — CITALOPRAM HYDROBROMIDE 20 MG PO TABS
20.0000 mg | ORAL_TABLET | Freq: Every day | ORAL | Status: DC
  Administered 2013-02-05 – 2013-02-08 (×4): 20 mg via ORAL
  Filled 2013-02-04 (×4): qty 1

## 2013-02-04 NOTE — Telephone Encounter (Signed)
Having jaw pain again today-feel very tired-these are the symptoms he had when he had to have stents!Please call to advise!

## 2013-02-04 NOTE — H&P (Signed)
[facility] History and Physical  AADIL SUR EAV:[id number] DOB: 01/29/1941 DOA: 02/04/2013  Referring physician: ER physician. PCP: [name], MD  Specialists: [facility]  Chief Complaint: Jaw pain and chest discomfort.  HPI: FAROUK VIVERO is a 72 y.o. male with known history of CAD status post stenting, hypertension and hyperlipidemia and diabetes mellitus on diet presented to the year because of jaw discomfort with chest pressure for [place] 2-3 days. Patient's symptoms usually happen in the night. Denies any shortness of breath diaphoresis nausea vomiting or palpitations. In the ER chest x-ray EKG and cardiac enzymes were unremarkable and patient has been admitted for further management and rule out ACS.  Review of Systems: As presented in the history of presenting illness, rest negative.  Past Medical History  Diagnosis Date  . Onychomycosis   . CAD (coronary artery disease)   . GERD (gastroesophageal reflux disease)   . History of colonoscopy 2004    finding of tics and AMV only no polpys  . DDD (degenerative disc disease) 2008  . Cataract   . Diabetes mellitus   . Rosacea   . Hypertension    Past Surgical History  Procedure Laterality Date  . Coronary angioplasty with stent placement  09/2002    2 stents  . Colon surgery  2005    renal pouch rectal anastimosis  . Cardiac surgery     Social History:  reports that he has been smoking Cigarettes.  He has been smoking about 1.00 pack per day. He quit smokeless tobacco use about 2 years ago. He reports that  drinks alcohol. He reports that he does not use illicit drugs. Lives at home. where does patient live-- Can do ADLs. Can patient participate in ADLs?  Allergies  Allergen Reactions  . Fentanyl     unknown  . Gabapentin     unknown  . Lisinopril     unknown  . Lyrica (Pregabalin)     unknown    Family History  Problem Relation Age of Onset  . Heart disease Father    heart attack      Prior to Admission medications   Medication Sig Start Date End Date Taking? Authorizing Provider  amLODipine (NORVASC) 5 MG tablet Take 5 mg by mouth daily.     Yes Historical Provider, MD  aspirin 81 MG tablet Take 81 mg by mouth daily.     Yes Historical Provider, MD  atorvastatin (LIPITOR) 40 MG tablet Take 40 mg by mouth daily.   Yes Historical Provider, MD  citalopram (CELEXA) 20 MG tablet Take 20 mg by mouth daily.   Yes Historical Provider, MD  Fluticasone-Salmeterol (ADVAIR) 100-50 MCG/DOSE AEPB Inhale 1 puff into the lungs every 12 (twelve) hours.   Yes Historical Provider, MD  glucose blood test strip 1 each by Other route as needed. Use as instructed    Yes Historical Provider, MD  glucose monitoring kit (FREESTYLE) monitoring kit 1 each by Does not apply route as needed.     Yes Historical Provider, MD  losartan-hydrochlorothiazide (HYZAAR) 100-25 MG per tablet Take 1 tablet by mouth daily.   Yes Historical Provider, MD  metroNIDAZOLE (METROCREAM) 0.75 % cream Apply topically 2 (two) times daily.     Yes Historical Provider, MD  omeprazole (PRILOSEC) 20 MG capsule Take 40 mg by mouth daily.    Yes Historical Provider, MD   Physical Exam: Filed Vitals:   02/04/13 1945 02/04/13 2000 02/04/13 2030 02/04/13 2113  BP: 180/79  192/90 193/90 190/80  Pulse: 61 61 64 63  Temp:    98.3 F (36.8 C)  TempSrc:    Oral  Resp: 14 19 15 18   Height:    5' 8.5" (1.74 m)  Weight:    103.692 kg (228 lb 9.6 oz)  SpO2: 95% 95% 97% 99%     General:  Well-developed well-nourished.  Eyes: Anicteric no pallor.  ENT: No discharge from the ears eyes nose mouth.  Neck: No mass felt.  Cardiovascular: S1-S2 heard.  Respiratory: No rhonchi or crepitations.  Abdomen: Soft nontender bowel sounds present.  Skin: No rash.  Musculoskeletal: No edema.  Psychiatric: Appears normal.  Neurologic: Alert awake oriented to time place and person. Moves all extremities.  Labs on  Admission:  Basic Metabolic Panel:  Recent Labs Lab 02/04/13 1646  NA 137  K 4.2  CL 98  CO2 29  GLUCOSE 91  BUN 15  CREATININE 0.86  CALCIUM 9.8   Liver Function Tests: No results found for this basename: AST, ALT, ALKPHOS, BILITOT, PROT, ALBUMIN,  in the last 168 hours No results found for this basename: LIPASE, AMYLASE,  in the last 168 hours No results found for this basename: AMMONIA,  in the last 168 hours CBC:  Recent Labs Lab 02/04/13 1646  WBC 11.0*  HGB 15.1  HCT 43.3  MCV 90.4  PLT 208   Cardiac Enzymes: No results found for this basename: CKTOTAL, CKMB, CKMBINDEX, TROPONINI,  in the last 168 hours  BNP (last 3 results)  Recent Labs  02/04/13 1646  PROBNP 503.0*   CBG: No results found for this basename: GLUCAP,  in the last 168 hours  Radiological Exams on Admission: Dg Chest 2 View  02/04/2013   *RADIOLOGY REPORT*  Clinical Data: Jaw pain.  History of coronary artery disease with prior stenting.  Current history of hypertension and diabetes. Smoker.  CHEST - 2 VIEW  Comparison: Two-view chest x-ray 11/12/2012, 06/02/2011, 09/20/2009, 03/29/2007.  Findings: Cardiac silhouette normal in size, cardiac silhouette upper normal in size, unchanged.  Thoracic aorta mildly atherosclerotic, unchanged.  Hilar and mediastinal contours otherwise unremarkable.  Emphysematous changes in the upper lobes and mild hyperinflation, unchanged.  Lungs clear.  Bronchovascular markings normal.  Pulmonary vascularity normal.  No pneumothorax. No pleural effusions.  Mild degenerative changes involving the thoracic spine.  Dorsal column stimulating device overlying the mid thoracic spine.  IMPRESSION: COPD/emphysema.  No acute cardiopulmonary disease.  Stable examination.   Original Report Authenticated By: Hulan Saas, M.D.    EKG: Independently reviewed. Normal sinus rhythm.  Assessment/Plan Principal Problem:   Chest pain Active Problems:   Incisional hernia   HTN  (hypertension)   DM (diabetes mellitus)   CAD (coronary artery disease)   1. Chest discomfort and jaw pain with history of CAD status post stenting concerning for unstable angina - cycle cardiac markers. Aspirin. Nitroglycerin when necessary. Keep patient n.p.o. past midnight in anticipation of possible procedure. 2. Diabetes mellitus type 2 on diet - closely follow CBGs. 3. Hypertension uncontrolled - continue home medications. I have placed patient on IV hydralazine when necessary for systolic blood pressure was 160. 4. Hyperlipidemia - continue home medications.    Code Status: Full code.  Family Communication: None.  Disposition Plan: Admit for observation.    Laverne Hursey N. [facility] Pager [phone].  If 7PM-7AM, please contact night-coverage [email or web address] Password [email or web address] 02/04/2013, 10:14 PM

## 2013-02-04 NOTE — ED Provider Notes (Signed)
History     CSN: [id number]  Arrival date & time 02/04/13  1636   First MD Initiated Contact with Patient 02/04/13 1811      Chief Complaint  Patient presents with  . Jaw Pain    (Consider location/radiation/quality/duration/timing/severity/associated sxs/prior treatment) HPI Comments: Pt with PMH significant for CAD, COPD, GERD, DDD, DM, HTN, prior angioplasty with stent placement, presents to the ED for lower jaw pain and generalized chest discomfort and pressure.  Sx present intermittently over the past 3 days, some episodes more intense than others.  Chest pressure exacerbated by lying on his left side or flat on his back. No pain or difficulty chewing or swallowing. Hx of GERD but states these sx are different than his usual reflux. Pt also notes decreased energy and feelings of malaise over the past week or so.  No recent sick contacts, cough, fevers, sweats, or chills.  Pt has cardiac hx with drug-eluding stent in place, currently see's cardiology annually- Dr. Nanetta Batty.  Stress test in the past 2 years which pt states was normal.  Denies any current chest pain or SOB.  Prior anticoagulation following cardiac procedure resulted in GI bleed and partial colectomy later revised to total colectomy.  The history is provided by the patient and the spouse.    Past Medical History  Diagnosis Date  . Onychomycosis   . CAD (coronary artery disease)   . GERD (gastroesophageal reflux disease)   . History of colonoscopy 2004    finding of tics and AMV only no polpys  . DDD (degenerative disc disease) 2008  . Cataract   . Diabetes mellitus   . Rosacea   . Hypertension     Past Surgical History  Procedure Laterality Date  . Coronary angioplasty with stent placement  09/2002    2 stents  . Colon surgery  2005    renal pouch rectal anastimosis  . Cardiac surgery      Family History  Problem Relation Age of Onset  . Heart disease Father     heart attack    History   Substance Use Topics  . Smoking status: Current Every Day Smoker -- 1.00 packs/day    Types: Cigarettes  . Smokeless tobacco: Former Neurosurgeon    Quit date: 10/04/2010  . Alcohol Use: 0.0 oz/week      Review of Systems  HENT:       Jaw pain  Cardiovascular: Positive for chest pain.  All other systems reviewed and are negative.    Allergies  Fentanyl; Gabapentin; Lisinopril; and Lyrica  Home Medications   Current Outpatient Rx  Name  Route  Sig  Dispense  Refill  . amLODipine (NORVASC) 5 MG tablet   Oral   Take 5 mg by mouth daily.           [name] aspirin 81 MG tablet   Oral   Take 81 mg by mouth daily.           [name] atorvastatin (LIPITOR) 40 MG tablet   Oral   Take 40 mg by mouth daily.         . citalopram (CELEXA) 20 MG tablet   Oral   Take 20 mg by mouth daily.         . Fluticasone-Salmeterol (ADVAIR) 100-50 MCG/DOSE AEPB   Inhalation   Inhale 1 puff into the lungs every 12 (twelve) hours.         [name] glucose blood test strip   Other  1 each by Other route as needed. Use as instructed          . glucose monitoring kit (FREESTYLE) monitoring kit   Does not apply   1 each by Does not apply route as needed.           [name] losartan-hydrochlorothiazide (HYZAAR) 100-25 MG per tablet   Oral   Take 1 tablet by mouth daily.         . metroNIDAZOLE (METROCREAM) 0.75 % cream   Topical   Apply topically 2 (two) times daily.           [name] omeprazole (PRILOSEC) 20 MG capsule   Oral   Take 40 mg by mouth daily.            BP 176/82  Pulse 68  Temp(Src) 98.1 F (36.7 C) (Oral)  Resp 22  SpO2 96%  Physical Exam  Nursing note and vitals reviewed. Constitutional: He is oriented to person, place, and time. He appears well-developed and well-nourished. No distress.  HENT:  Head: Normocephalic and atraumatic. No trismus in the jaw.  Mouth/Throat: Oropharynx is clear and moist and mucous membranes are normal. He has dentures (partial, upper). No dental  abscesses. No oropharyngeal exudate, posterior oropharyngeal edema or posterior oropharyngeal erythema.  Gingiva in good condition, no evidence of dental abscess or signs of infection, handling secretions appropriately, no trismus  Eyes: Conjunctivae and EOM are normal. Pupils are equal, round, and reactive to light.  Neck: Normal range of motion. Neck supple.  Cardiovascular: Normal rate, regular rhythm and normal heart sounds.   Pulmonary/Chest: Effort normal and breath sounds normal. No respiratory distress.  Abdominal: Soft. Bowel sounds are normal. There is no tenderness. There is no guarding.  Musculoskeletal: Normal range of motion. He exhibits no edema.  Neurological: He is alert and oriented to person, place, and time.  Skin: Skin is warm and dry. He is not diaphoretic.  Psychiatric: He has a normal mood and affect.    ED Course  Procedures (including critical care time)   Date: 02/04/2013  Rate: 67  Rhythm: normal sinus rhythm  QRS Axis: right  Intervals: normal  ST/T Wave abnormalities: normal  Conduction Disutrbances:none  Narrative Interpretation: RAD, no STEMI  Old EKG Reviewed: none available    Labs Reviewed  CBC - Abnormal; Notable for the following:    WBC 11.0 (*)    All other components within normal limits  BASIC METABOLIC PANEL - Abnormal; Notable for the following:    GFR calc non Af Amer 86 (*)    All other components within normal limits  PRO B NATRIURETIC PEPTIDE - Abnormal; Notable for the following:    Pro B Natriuretic peptide (BNP) 503.0 (*)    All other components within normal limits  POCT I-STAT TROPONIN I   Dg Chest 2 View  02/04/2013   *RADIOLOGY REPORT*  Clinical Data: Jaw pain.  History of coronary artery disease with prior stenting.  Current history of hypertension and diabetes. Smoker.  CHEST - 2 VIEW  Comparison: Two-view chest x-ray 11/12/2012, 06/02/2011, 09/20/2009, 03/29/2007.  Findings: Cardiac silhouette normal in size, cardiac  silhouette upper normal in size, unchanged.  Thoracic aorta mildly atherosclerotic, unchanged.  Hilar and mediastinal contours otherwise unremarkable.  Emphysematous changes in the upper lobes and mild hyperinflation, unchanged.  Lungs clear.  Bronchovascular markings normal.  Pulmonary vascularity normal.  No pneumothorax. No pleural effusions.  Mild degenerative changes involving the thoracic spine.  Dorsal column stimulating device overlying  the mid thoracic spine.  IMPRESSION: COPD/emphysema.  No acute cardiopulmonary disease.  Stable examination.   Original Report Authenticated By: Hulan Saas, M.D.     1. Chest pain   2. Jaw pain   3. CAD (coronary artery disease)   4. HTN (hypertension)   5. DM (diabetes mellitus)   6. GERD (gastroesophageal reflux disease)       MDM   72 y.o. M presenting to the ED for 3 days of intermittent, generalized chest pain and pressure with associated lower jaw pain.  Pt has hx CAD with drug eluding stent in place.  Cardiologist- Dr. Allyson Sabal.  EKG NSR without ischemic changes.  Trop negative .0.06.  CXR consistent with dx of COPD.  Labs largely WNL.  Moderately hypertensive, IV lopressor given. Pt is currently pain free but given cardiac hx, feel that he may require admission for further cardiac eval.  Hospitalist consulted, Dr. Toniann Fail- pt will be admitted to [facility].  Will hold heparin for now as prior anti-coag resulted in gross GI bleed.  Temp admit orders placed.  VS stable for transfer.       Garlon Hatchet, PA-C 02/05/13 1100

## 2013-02-04 NOTE — Telephone Encounter (Signed)
Wife, Britta Mccreedy, stated pt has been having CP and jaw pain since last night.  Stated these are the exact symptoms pt had when he had a heart attack and had two stents placed.  Wife advised to take pt to ER now for evaluation.  Verbalized understanding and agreed w/ plan.

## 2013-02-04 NOTE — ED Notes (Signed)
Pt reports over the past 3 days he has had pain in his jaw and gums. States that he has also been increasingly tried and slight SOB. States that he had the same symptoms back in 2003 and had 2 stents placed. Cardiologist is Dr. Gery Pray.

## 2013-02-05 ENCOUNTER — Encounter (HOSPITAL_COMMUNITY): Payer: Self-pay | Admitting: Cardiology

## 2013-02-05 DIAGNOSIS — Z720 Tobacco use: Secondary | ICD-10-CM

## 2013-02-05 DIAGNOSIS — E782 Mixed hyperlipidemia: Secondary | ICD-10-CM | POA: Diagnosis present

## 2013-02-05 DIAGNOSIS — I10 Essential (primary) hypertension: Secondary | ICD-10-CM | POA: Diagnosis not present

## 2013-02-05 DIAGNOSIS — E785 Hyperlipidemia, unspecified: Secondary | ICD-10-CM | POA: Diagnosis not present

## 2013-02-05 DIAGNOSIS — R079 Chest pain, unspecified: Secondary | ICD-10-CM | POA: Diagnosis not present

## 2013-02-05 DIAGNOSIS — F17200 Nicotine dependence, unspecified, uncomplicated: Secondary | ICD-10-CM | POA: Diagnosis not present

## 2013-02-05 DIAGNOSIS — E119 Type 2 diabetes mellitus without complications: Secondary | ICD-10-CM | POA: Diagnosis not present

## 2013-02-05 HISTORY — DX: Hyperlipidemia, unspecified: E78.5

## 2013-02-05 HISTORY — DX: Tobacco use: Z72.0

## 2013-02-05 LAB — CBC WITH DIFFERENTIAL/PLATELET
Basophils Absolute: 0.1 10*3/uL (ref 0.0–0.1)
Basophils Relative: 1 % (ref 0–1)
Eosinophils Absolute: 0.3 10*3/uL (ref 0.0–0.7)
Hemoglobin: 15 g/dL (ref 13.0–17.0)
MCH: 30.8 pg (ref 26.0–34.0)
MCHC: 34.4 g/dL (ref 30.0–36.0)
Monocytes Relative: 7 % (ref 3–12)
Neutro Abs: 6.5 10*3/uL (ref 1.7–7.7)
Neutrophils Relative %: 61 % (ref 43–77)
Platelets: 222 10*3/uL (ref 150–400)
RDW: 13.7 % (ref 11.5–15.5)

## 2013-02-05 LAB — GLUCOSE, CAPILLARY
Glucose-Capillary: 188 mg/dL — ABNORMAL HIGH (ref 70–99)
Glucose-Capillary: 99 mg/dL (ref 70–99)

## 2013-02-05 LAB — CREATININE, SERUM
Creatinine, Ser: 0.81 mg/dL (ref 0.50–1.35)
GFR calc non Af Amer: 88 mL/min — ABNORMAL LOW (ref >90)

## 2013-02-05 LAB — COMPREHENSIVE METABOLIC PANEL
ALT: 25 U/L (ref 0–53)
AST: 15 U/L (ref 0–37)
Alkaline Phosphatase: 62 U/L (ref 39–117)
CO2: 24 mEq/L (ref 19–32)
Calcium: 9.1 mg/dL (ref 8.4–10.5)
Chloride: 103 mEq/L (ref 96–112)
GFR calc Af Amer: >90 mL/min (ref >90)
GFR calc non Af Amer: 89 mL/min — ABNORMAL LOW (ref >90)
Glucose, Bld: 106 mg/dL — ABNORMAL HIGH (ref 70–99)
Potassium: 4.3 mEq/L (ref 3.5–5.1)
Sodium: 139 mEq/L (ref 135–145)
Total Bilirubin: 0.4 mg/dL (ref 0.3–1.2)

## 2013-02-05 LAB — TROPONIN I: Troponin I: <0.3 ng/mL (ref <0.30)

## 2013-02-05 MED ORDER — ISOSORBIDE MONONITRATE ER 30 MG PO TB24
30.0000 mg | ORAL_TABLET | Freq: Every day | ORAL | Status: DC
  Administered 2013-02-05 – 2013-02-08 (×4): 30 mg via ORAL
  Filled 2013-02-05 (×4): qty 1

## 2013-02-05 MED ORDER — METOPROLOL TARTRATE 25 MG PO TABS
25.0000 mg | ORAL_TABLET | Freq: Two times a day (BID) | ORAL | Status: DC
  Administered 2013-02-05 – 2013-02-08 (×7): 25 mg via ORAL
  Filled 2013-02-05 (×9): qty 1

## 2013-02-05 MED ORDER — HYDRALAZINE HCL 20 MG/ML IJ SOLN
10.0000 mg | Freq: Once | INTRAMUSCULAR | Status: AC
  Administered 2013-02-05: 10 mg via INTRAVENOUS
  Filled 2013-02-05: qty 1

## 2013-02-05 MED ORDER — ENOXAPARIN SODIUM 100 MG/ML ~~LOC~~ SOLN
1.0000 mg/kg | Freq: Two times a day (BID) | SUBCUTANEOUS | Status: AC
  Administered 2013-02-05 – 2013-02-08 (×6): 100 mg via SUBCUTANEOUS
  Filled 2013-02-05 (×6): qty 1

## 2013-02-05 NOTE — Progress Notes (Signed)
Assessment/Plan: Principal Problem:   Chest pain - this certainly sounds like ACS or unstable angina. Will ask cardiology to see for recommendations about further treatment and evaluation.  Active Problems:   Incisional hernia   HTN (hypertension)   DM (diabetes mellitus)   CAD (coronary artery disease)   Subjective: He has had a little bit of jaw pain overnight. Did not ask for NTG. Resolved on its own in about 15 minutes. This is the same discomfort he had in the early 2000s when he was having problems with CAD.   Objective:  Vital Signs: Filed Vitals:   02/04/13 2257 02/04/13 2338 02/05/13 0117 02/05/13 0447  BP: 180/80 179/74 164/86 163/73  Pulse:  63 68 67  Temp:    98.2 F (36.8 C)  TempSrc:    Oral  Resp:    18  Height:      Weight:    101.061 kg (222 lb 12.8 oz)  SpO2:    93%     EXAM: comfortable.   No intake or output data in the 24 hours ending 02/05/13 0822  Lab Results:  Recent Labs  02/04/13 1646 02/04/13 2251 02/05/13 0523  NA 137  --  139  K 4.2  --  4.3  CL 98  --  103  CO2 29  --  24  GLUCOSE 91  --  106*  BUN 15  --  12  CREATININE 0.86 0.81 0.78  CALCIUM 9.8  --  9.1    Recent Labs  02/05/13 0523  AST 15  ALT 25  ALKPHOS 62  BILITOT 0.4  PROT 6.5  ALBUMIN 3.5   No results found for this basename: LIPASE, AMYLASE,  in the last 72 hours  Recent Labs  02/04/13 2251 02/05/13 0523  WBC 10.1 10.6*  NEUTROABS  --  6.5  HGB 13.6 15.0  HCT 39.3 43.6  MCV 89.7 89.5  PLT 210 222    Recent Labs  02/04/13 2251 02/05/13 0523  TROPONINI <0.30 <0.30   No components found with this basename: POCBNP,  No results found for this basename: DDIMER,  in the last 72 hours No results found for this basename: HGBA1C,  in the last 72 hours No results found for this basename: CHOL, HDL, LDLCALC, TRIG, CHOLHDL, LDLDIRECT,  in the last 72 hours No results found for this basename: TSH, T4TOTAL, FREET3, T3FREE, THYROIDAB,  in the last 72  hours No results found for this basename: VITAMINB12, FOLATE, FERRITIN, TIBC, IRON, RETICCTPCT,  in the last 72 hours  Studies/Results: Dg Chest 2 View  02/04/2013   *RADIOLOGY REPORT*  Clinical Data: Jaw pain.  History of coronary artery disease with prior stenting.  Current history of hypertension and diabetes. Smoker.  CHEST - 2 VIEW  Comparison: Two-view chest x-ray 11/12/2012, 06/02/2011, 09/20/2009, 03/29/2007.  Findings: Cardiac silhouette normal in size, cardiac silhouette upper normal in size, unchanged.  Thoracic aorta mildly atherosclerotic, unchanged.  Hilar and mediastinal contours otherwise unremarkable.  Emphysematous changes in the upper lobes and mild hyperinflation, unchanged.  Lungs clear.  Bronchovascular markings normal.  Pulmonary vascularity normal.  No pneumothorax. No pleural effusions.  Mild degenerative changes involving the thoracic spine.  Dorsal column stimulating device overlying the mid thoracic spine.  IMPRESSION: COPD/emphysema.  No acute cardiopulmonary disease.  Stable examination.   Original Report Authenticated By: Hulan Saas, M.D.   Medications: Medications administered in the last 24 hours reviewed.  Current Medication List reviewed.    LOS: 1 day  [facility] @ [facility] [phone]) 02/05/2013, 8:22 AM

## 2013-02-05 NOTE — Progress Notes (Signed)
ANTICOAGULATION CONSULT NOTE - Initial Consult  Pharmacy Consult for Lovenox Indication: chest pain/ACS  Allergies  Allergen Reactions  . Fentanyl     unknown  . Gabapentin     unknown  . Lisinopril     unknown  . Lyrica (Pregabalin)     unknown    Patient Measurements: Height: 5' 8.5" (174 cm) Weight: 222 lb 12.8 oz (101.061 kg) IBW/kg (Calculated) : 69.55   Vital Signs: Temp: 98.2 F (36.8 C) (05/24 0447) Temp src: Oral (05/24 0447) BP: 134/72 mmHg (05/24 1138) Pulse Rate: 74 (05/24 1138)  Labs:  Recent Labs  02/04/13 1646 02/04/13 2251 02/05/13 0523 02/05/13 1011  HGB 15.1 13.6 15.0  --   HCT 43.3 39.3 43.6  --   PLT 208 210 222  --   CREATININE 0.86 0.81 0.78  --   TROPONINI  --  <0.30 <0.30 <0.30    Estimated Creatinine Clearance: 99.9 ml/min (by C-G formula based on Cr of 0.78).   Medical History: Past Medical History  Diagnosis Date  . Onychomycosis   . CAD (coronary artery disease)     stent to RCA 2003 also had 40% lesion at that time  . GERD (gastroesophageal reflux disease)   . History of colonoscopy 2004    finding of tics and AMV only no polpys  . DDD (degenerative disc disease) 2008  . Cataract   . Diabetes mellitus   . Rosacea   . Hypertension   . COPD (chronic obstructive pulmonary disease)   . Hyperlipidemia 02/05/2013  . Tobacco abuse 02/05/2013      Assessment: 72 y.o. male with known history of CAD status post stenting with 2 stents to RCA in 2003 and residual LAD Dz, - presented to the ER because of jaw discomfort with chest pressure for 2-3 days.  He continues to smoke.  Cr stable 0.8, lytes wnl, cbc wnl, no bleeding noted.  Plan to begin lovenox and r/o ACS.    Goal of Therapy:  Anti-Xa level 0.6-1.2 units/ml 4hrs after LMWH dose given Monitor platelets by anticoagulation protocol: Yes   Plan:  Lovenox 1mg /kg = 100mg  q12hr Monitor CBC, Bmet, s/s bleeding  Leota Sauers Pharm.D. CPP, BCPS Clinical  Pharmacist [phone] 02/05/2013 1:37 PM

## 2013-02-05 NOTE — Consult Note (Signed)
[facility]   Reason for Consult: chest pain, probable ACS  Referring Physician: Dr. Rowe Robert, MD Primary Cardiologist:Dr. April Carlyon is an 72 y.o. male.    Chief Complaint:  Pt admitted 02/04/13 with jaw pain and chest pain.  HPI: 72 y.o. male with known history of CAD status post stenting with 2 stents to RCA in 2003 and residual LAD, hypertension and hyperlipidemia and diabetes mellitus on diet, presented to the ER because of jaw discomfort with chest pressure for 2-3 days. Patient's symptoms usually happen in the night. Denies any shortness of breath diaphoresis nausea vomiting or palpitations. In the ER chest x-ray EKG and cardiac enzymes were unremarkable and patient has been admitted for further management and rule out ACS.   Troponin Is are negative. Pro BNP 503.  Currently no discomfort.  He did have mild SOB walking to the BR this AM.  On VTE prophylaxis.  He stated this discomfort is just like his angina from 2003.  No diaphoresis, no SOB other than his usual associated with COPD.  He does continue to smoke.      Past Medical History  Diagnosis Date  . Onychomycosis   . CAD (coronary artery disease)     stent to RCA 2003 also had 40% lesion at that time  . GERD (gastroesophageal reflux disease)   . History of colonoscopy 2004    finding of tics and AMV only no polpys  . DDD (degenerative disc disease) 2008  . Cataract   . Diabetes mellitus   . Rosacea   . Hypertension   . COPD (chronic obstructive pulmonary disease)   . Hyperlipidemia 02/05/2013  . Tobacco abuse 02/05/2013    Past Surgical History  Procedure Laterality Date  . Coronary angioplasty with stent placement  09/2002    2 stents to RCA  . Colon surgery  2005    renal pouch rectal anastimosis  . Cardiac surgery      Family History  Problem Relation Age of Onset  . Heart disease Father     heart attack at 55  . Heart attack Father    . Colonic polyp Mother     that bled out  . Heart disease Sister   . Colonic polyp Sister    Social History:  reports that he has been smoking Cigarettes.  He has been smoking about 1.00 pack per day. He quit smokeless tobacco use about 2 years ago. He reports that  drinks alcohol. He reports that he does not use illicit drugs. Married, 1 daughter, 2 grandchildren, 1 GGC.  Pt rarely drinks ETOH.  Allergies:  Allergies  Allergen Reactions  . Fentanyl     unknown  . Gabapentin     unknown  . Lisinopril     unknown  . Lyrica (Pregabalin)     unknown    Medications Prior to Admission  Medication Sig Dispense Refill  . amLODipine (NORVASC) 5 MG tablet Take 5 mg by mouth daily.        [name] aspirin 81 MG tablet Take 81 mg by mouth daily.        [name] atorvastatin (LIPITOR) 40 MG tablet Take 40 mg by mouth daily.      . citalopram (CELEXA) 20 MG tablet Take 20 mg by mouth daily.      . Fluticasone-Salmeterol (ADVAIR) 100-50 MCG/DOSE AEPB Inhale 1 puff into the lungs every 12 (twelve) hours.      [name]  glucose blood test strip 1 each by Other route as needed. Use as instructed       . glucose monitoring kit (FREESTYLE) monitoring kit 1 each by Does not apply route as needed.        [name] losartan-hydrochlorothiazide (HYZAAR) 100-25 MG per tablet Take 1 tablet by mouth daily.      . metroNIDAZOLE (METROCREAM) 0.75 % cream Apply topically 2 (two) times daily.        [name] omeprazole (PRILOSEC) 20 MG capsule Take 40 mg by mouth daily.         Results for orders placed during the hospital encounter of 02/04/13 (from the past 48 hour(s))  CBC     Status: Abnormal   Collection Time    02/04/13  4:46 PM      Result Value Range   WBC 11.0 (*) 4.0 - 10.5 K/uL   RBC 4.79  4.22 - 5.81 MIL/uL   Hemoglobin 15.1  13.0 - 17.0 g/dL   HCT 16.1  09.6 - 04.5 %   MCV 90.4  78.0 - 100.0 fL   MCH 31.5  26.0 - 34.0 pg   MCHC 34.9  30.0 - 36.0 g/dL   RDW 40.9  81.1 - 91.4 %   Platelets 208  150 - 400 K/uL  BASIC  METABOLIC PANEL     Status: Abnormal   Collection Time    02/04/13  4:46 PM      Result Value Range   Sodium 137  135 - 145 mEq/L   Potassium 4.2  3.5 - 5.1 mEq/L   Chloride 98  96 - 112 mEq/L   CO2 29  19 - 32 mEq/L   Glucose, Bld 91  70 - 99 mg/dL   BUN 15  6 - 23 mg/dL   Creatinine, Ser 7.82  0.50 - 1.35 mg/dL   Calcium 9.8  8.4 - 95.6 mg/dL   GFR calc non Af Amer 86 (*) >90 mL/min   GFR calc Af Amer >90  >90 mL/min   Comment:            The eGFR has been calculated     using the CKD EPI equation.     This calculation has not been     validated in all clinical     situations.     eGFR's persistently     <90 mL/min signify     possible Chronic Kidney Disease.  PRO B NATRIURETIC PEPTIDE     Status: Abnormal   Collection Time    02/04/13  4:46 PM      Result Value Range   Pro B Natriuretic peptide (BNP) 503.0 (*) 0 - 125 pg/mL  POCT I-STAT TROPONIN I     Status: None   Collection Time    02/04/13  4:55 PM      Result Value Range   Troponin i, poc 0.06  0.00 - 0.08 ng/mL   Comment 3            Comment: Due to the release kinetics of cTnI,     a negative result within the first hours     of the onset of symptoms does not rule out     myocardial infarction with certainty.     If myocardial infarction is still suspected,     repeat the test at appropriate intervals.  TROPONIN I     Status: None   Collection Time    02/04/13 10:51 PM  Result Value Range   Troponin I <0.30  <0.30 ng/mL   Comment:            Due to the release kinetics of cTnI,     a negative result within the first hours     of the onset of symptoms does not rule out     myocardial infarction with certainty.     If myocardial infarction is still suspected,     repeat the test at appropriate intervals.  CBC     Status: None   Collection Time    02/04/13 10:51 PM      Result Value Range   WBC 10.1  4.0 - 10.5 K/uL   RBC 4.38  4.22 - 5.81 MIL/uL   Hemoglobin 13.6  13.0 - 17.0 g/dL   HCT 51.8   84.1 - 66.0 %   MCV 89.7  78.0 - 100.0 fL   MCH 31.1  26.0 - 34.0 pg   MCHC 34.6  30.0 - 36.0 g/dL   RDW 63.0  16.0 - 10.9 %   Platelets 210  150 - 400 K/uL  CREATININE, SERUM     Status: Abnormal   Collection Time    02/04/13 10:51 PM      Result Value Range   Creatinine, Ser 0.81  0.50 - 1.35 mg/dL   GFR calc non Af Amer 88 (*) >90 mL/min   GFR calc Af Amer >90  >90 mL/min   Comment:            The eGFR has been calculated     using the CKD EPI equation.     This calculation has not been     validated in all clinical     situations.     eGFR's persistently     <90 mL/min signify     possible Chronic Kidney Disease.  TROPONIN I     Status: None   Collection Time    02/05/13  5:23 AM      Result Value Range   Troponin I <0.30  <0.30 ng/mL   Comment:            Due to the release kinetics of cTnI,     a negative result within the first hours     of the onset of symptoms does not rule out     myocardial infarction with certainty.     If myocardial infarction is still suspected,     repeat the test at appropriate intervals.  COMPREHENSIVE METABOLIC PANEL     Status: Abnormal   Collection Time    02/05/13  5:23 AM      Result Value Range   Sodium 139  135 - 145 mEq/L   Potassium 4.3  3.5 - 5.1 mEq/L   Chloride 103  96 - 112 mEq/L   CO2 24  19 - 32 mEq/L   Glucose, Bld 106 (*) 70 - 99 mg/dL   BUN 12  6 - 23 mg/dL   Creatinine, Ser 3.23  0.50 - 1.35 mg/dL   Calcium 9.1  8.4 - 55.7 mg/dL   Total Protein 6.5  6.0 - 8.3 g/dL   Albumin 3.5  3.5 - 5.2 g/dL   AST 15  0 - 37 U/L   ALT 25  0 - 53 U/L   Alkaline Phosphatase 62  39 - 117 U/L   Total Bilirubin 0.4  0.3 - 1.2 mg/dL   GFR calc non Af Amer 89 (*) >90  mL/min   GFR calc Af Amer >90  >90 mL/min   Comment:            The eGFR has been calculated     using the CKD EPI equation.     This calculation has not been     validated in all clinical     situations.     eGFR's persistently     <90 mL/min signify      possible Chronic Kidney Disease.  CBC WITH DIFFERENTIAL     Status: Abnormal   Collection Time    02/05/13  5:23 AM      Result Value Range   WBC 10.6 (*) 4.0 - 10.5 K/uL   RBC 4.87  4.22 - 5.81 MIL/uL   Hemoglobin 15.0  13.0 - 17.0 g/dL   HCT 09.6  04.5 - 40.9 %   MCV 89.5  78.0 - 100.0 fL   MCH 30.8  26.0 - 34.0 pg   MCHC 34.4  30.0 - 36.0 g/dL   RDW 81.1  91.4 - 78.2 %   Platelets 222  150 - 400 K/uL   Neutrophils Relative % 61  43 - 77 %   Neutro Abs 6.5  1.7 - 7.7 K/uL   Lymphocytes Relative 29  12 - 46 %   Lymphs Abs 3.1  0.7 - 4.0 K/uL   Monocytes Relative 7  3 - 12 %   Monocytes Absolute 0.7  0.1 - 1.0 K/uL   Eosinophils Relative 2  0 - 5 %   Eosinophils Absolute 0.3  0.0 - 0.7 K/uL   Basophils Relative 1  0 - 1 %   Basophils Absolute 0.1  0.0 - 0.1 K/uL  GLUCOSE, CAPILLARY     Status: Abnormal   Collection Time    02/05/13  7:48 AM      Result Value Range   Glucose-Capillary 112 (*) 70 - 99 mg/dL   Comment 1 Notify RN    TROPONIN I     Status: None   Collection Time    02/05/13 10:11 AM      Result Value Range   Troponin I <0.30  <0.30 ng/mL   Comment:            Due to the release kinetics of cTnI,     a negative result within the first hours     of the onset of symptoms does not rule out     myocardial infarction with certainty.     If myocardial infarction is still suspected,     repeat the test at appropriate intervals.   Dg Chest 2 View  02/04/2013   *RADIOLOGY REPORT*  Clinical Data: Jaw pain.  History of coronary artery disease with prior stenting.  Current history of hypertension and diabetes. Smoker.  CHEST - 2 VIEW  Comparison: Two-view chest x-ray 11/12/2012, 06/02/2011, 09/20/2009, 03/29/2007.  Findings: Cardiac silhouette normal in size, cardiac silhouette upper normal in size, unchanged.  Thoracic aorta mildly atherosclerotic, unchanged.  Hilar and mediastinal contours otherwise unremarkable.  Emphysematous changes in the upper lobes and mild  hyperinflation, unchanged.  Lungs clear.  Bronchovascular markings normal.  Pulmonary vascularity normal.  No pneumothorax. No pleural effusions.  Mild degenerative changes involving the thoracic spine.  Dorsal column stimulating device overlying the mid thoracic spine.  IMPRESSION: COPD/emphysema.  No acute cardiopulmonary disease.  Stable examination.   Original Report Authenticated By: Hulan Saas, M.D.    ROS: General:no colds or fevers, no weight changes (  lost some in Jan but has gained half of that back)  Skin:no rashes or ulcers HEENT:no blurred vision, no congestion- chronic cough now with thin clear mucus CV:see HPI PUL:see HPI GI:no diarrhea constipation or melena, no indigestion, no further hx of bleeding since partial colon removed bled while on Plavix GU:no hematuria, no dysuria MS:no joint pain, no claudication, chronic back pain Neuro:no syncope, no lightheadedness Endo:+ diabetes does controlled, no thyroid disease   Blood pressure 134/72, pulse 74, temperature 98.2 F (36.8 C), temperature source Oral, resp. rate 18, height 5' 8.5" (1.74 m), weight 222 lb 12.8 oz (101.061 kg), SpO2 93.00%. PE: General:Pleasant affect, NAD Skin:Warm and dry, brisk capillary refill HEENT:normocephalic, sclera clear, mucus membranes moist Neck:supple, no JVD, no bruits  Heart:S1S2 RRR without murmur, gallup, rub or click Lungs:clear without rales, rhonchi, or wheezes Abd: obese, soft, non tender, + BS, do not palpate liver spleen or masses Ext:no lower ext edema, 2+ pedal pulses, 2+ radial pulses Neuro:alert and oriented, MAE, follows commands, + facial symmetry    Assessment/Plan Principal Problem:   Chest pain Active Problems:   Incisional hernia   HTN (hypertension)   DM (diabetes mellitus)   CAD (coronary artery disease), hx of 2 stents to RCA in 2003, residual disease at that time of 40% LAD   Hyperlipidemia   Tobacco abuse  PLAN: Discussed stopping tobacco.  See Dr. Bishop Limbo note.  Leone Brand  Nurse Practitioner Certified [facility] Pager [phone] 02/05/2013, 11:44 AM   Patient seen and examined. Agree with assessment and plan. Pleasant 72 yo WM with known CAD, s/p RCA stents in 2003  At which time he had 40% LAD stenosis. He has continued to smoke, h/o DM2, Htn, hyperlipidemia and was admitted with identical discomfort that he had experienced prior to stenting. Initial enzymes are negative; ECG with acute changes. However, I am concerned that symptoms are reflective of CAD progression/unstable angina.  Will anticoagulate will lovenox. Add nitrates and beta blocker. Feel definitive cath indicated particularly with ongoing risk factors. ? Tuesday with Dr. Allyson Sabal.    Lennette Bihari, MD, [facility] 02/05/2013 11:51 AM

## 2013-02-05 NOTE — Progress Notes (Signed)
Utilization review completed.  P.J. Zacharias Ridling,RN,BSN Case Manager [phone]  

## 2013-02-06 DIAGNOSIS — F17200 Nicotine dependence, unspecified, uncomplicated: Secondary | ICD-10-CM

## 2013-02-06 DIAGNOSIS — E119 Type 2 diabetes mellitus without complications: Secondary | ICD-10-CM | POA: Diagnosis not present

## 2013-02-06 DIAGNOSIS — R079 Chest pain, unspecified: Secondary | ICD-10-CM

## 2013-02-06 DIAGNOSIS — E785 Hyperlipidemia, unspecified: Secondary | ICD-10-CM | POA: Diagnosis not present

## 2013-02-06 DIAGNOSIS — I10 Essential (primary) hypertension: Secondary | ICD-10-CM | POA: Diagnosis not present

## 2013-02-06 DIAGNOSIS — I2510 Atherosclerotic heart disease of native coronary artery without angina pectoris: Secondary | ICD-10-CM

## 2013-02-06 DIAGNOSIS — I200 Unstable angina: Secondary | ICD-10-CM | POA: Diagnosis not present

## 2013-02-06 LAB — GLUCOSE, CAPILLARY
Glucose-Capillary: 116 mg/dL — ABNORMAL HIGH (ref 70–99)
Glucose-Capillary: 136 mg/dL — ABNORMAL HIGH (ref 70–99)

## 2013-02-06 MED ORDER — AMLODIPINE BESYLATE 10 MG PO TABS
10.0000 mg | ORAL_TABLET | Freq: Every day | ORAL | Status: DC
  Administered 2013-02-06 – 2013-02-08 (×3): 10 mg via ORAL
  Filled 2013-02-06 (×4): qty 1

## 2013-02-06 NOTE — Progress Notes (Addendum)
Pt. Seen and examined. Agree with the NP/PA-C note as written.  He is chest pain free - most symptoms were of jaw ache, reminiscent of his prior angina symptoms. Troponins have been negative. The EKG is sinus with 1st degree AVB, otherwise unremarkable for ischemia. Although his symptoms are suggestive of unstable angina, he has had several days of on/off pain without any other supportive findings such as EKG changes or elevation in cardiac markers. Blood pressure still remains high and will need adjustment. Options for cardiac workup include non-invasive versus invasive testing. I would favor a myoview tomorrow if it can be arranged, if negative and his is doing better on medications, he could be sent home to follow-up in our office. If abnormal, he would need [facility] Tuesday.  Increased norvasc to 10 mg daily today.  Chrystie Nose, MD, [facility] Attending Cardiologist [facility]

## 2013-02-06 NOTE — Progress Notes (Signed)
Assessment/Plan: Principal Problem:   Chest pain, unstable angina, negative MI - plans noted.  Active Problems:   Incisional hernia   HTN (hypertension) - amlodipine increased by cards   DM (diabetes mellitus) - sugars are fine   CAD (coronary artery disease), hx of 2 stents to RCA in 2003, residual disease at that time of 40% LAD   Hyperlipidemia   Tobacco abuse   Subjective: No further discomfort.   Objective:  Vital Signs: Filed Vitals:   02/05/13 2137 02/05/13 2159 02/06/13 0625 02/06/13 0845  BP: 146/66  146/56   Pulse: 58 60 85   Temp: 98.3 F (36.8 C)  98.1 F (36.7 C)   TempSrc: Oral  Oral   Resp: 18  18   Height:      Weight:      SpO2: 97%  96% 97%     EXAM: alert. Comfortable.    Intake/Output Summary (Last 24 hours) at 02/06/13 1015 Last data filed at 02/05/13 1800  Gross per 24 hour  Intake    840 ml  Output      0 ml  Net    840 ml    Lab Results:  Recent Labs  02/04/13 1646 02/04/13 2251 02/05/13 0523  NA 137  --  139  K 4.2  --  4.3  CL 98  --  103  CO2 29  --  24  GLUCOSE 91  --  106*  BUN 15  --  12  CREATININE 0.86 0.81 0.78  CALCIUM 9.8  --  9.1    Recent Labs  02/05/13 0523  AST 15  ALT 25  ALKPHOS 62  BILITOT 0.4  PROT 6.5  ALBUMIN 3.5   No results found for this basename: LIPASE, AMYLASE,  in the last 72 hours  Recent Labs  02/04/13 2251 02/05/13 0523  WBC 10.1 10.6*  NEUTROABS  --  6.5  HGB 13.6 15.0  HCT 39.3 43.6  MCV 89.7 89.5  PLT 210 222    Recent Labs  02/04/13 2251 02/05/13 0523 02/05/13 1011  TROPONINI <0.30 <0.30 <0.30   No components found with this basename: POCBNP,  No results found for this basename: DDIMER,  in the last 72 hours No results found for this basename: HGBA1C,  in the last 72 hours No results found for this basename: CHOL, HDL, LDLCALC, TRIG, CHOLHDL, LDLDIRECT,  in the last 72 hours No results found for this basename: TSH, T4TOTAL, FREET3, T3FREE, THYROIDAB,  in the last  72 hours No results found for this basename: VITAMINB12, FOLATE, FERRITIN, TIBC, IRON, RETICCTPCT,  in the last 72 hours  Studies/Results: Dg Chest 2 View  02/04/2013   *RADIOLOGY REPORT*  Clinical Data: Jaw pain.  History of coronary artery disease with prior stenting.  Current history of hypertension and diabetes. Smoker.  CHEST - 2 VIEW  Comparison: Two-view chest x-ray 11/12/2012, 06/02/2011, 09/20/2009, 03/29/2007.  Findings: Cardiac silhouette normal in size, cardiac silhouette upper normal in size, unchanged.  Thoracic aorta mildly atherosclerotic, unchanged.  Hilar and mediastinal contours otherwise unremarkable.  Emphysematous changes in the upper lobes and mild hyperinflation, unchanged.  Lungs clear.  Bronchovascular markings normal.  Pulmonary vascularity normal.  No pneumothorax. No pleural effusions.  Mild degenerative changes involving the thoracic spine.  Dorsal column stimulating device overlying the mid thoracic spine.  IMPRESSION: COPD/emphysema.  No acute cardiopulmonary disease.  Stable examination.   Original Report Authenticated By: Hulan Saas, M.D.   Medications: Medications administered in the last 24  hours reviewed.  Current Medication List reviewed.    LOS: 2 days   [facility] @ [facility] [phone]) 02/06/2013, 10:15 AM

## 2013-02-06 NOTE — ED Provider Notes (Signed)
  I performed a history and physical examination of Albert Powell and discussed his management with Ms. Sanders.  I agree with the history, physical, assessment, and plan of care, with the following exceptions: None  I was present for the following procedures: None Time Spent in Critical Care of the patient: None Time spent in discussions with the patient and family: 72  Sameera Betton S Mr. Schillaci presents today with intermittent jaw pain for 2 days.  He had similar pain in the past when he had an MI.  This was about 10 years ago.  He has not had similar symptoms since.  He has some minimal chest tightness with this.  He denies any pain currently.  He did have gi bleeding after he was anticoagulated with his mi which required colon resection.  He denies any current bleeding.   PE WDWN male, nad cv- rrr Lungs- cta Abdomen- scars c.w. Previous resections, ventral hernia, soft nontender History/physical exam/procedure(s) were performed by non-physician practitioner and as supervising physician I was immediately available for consultation/collaboration. I have reviewed all notes and am in agreement with care and plan.    Hilario Quarry, MD 02/06/13 [phone]

## 2013-02-06 NOTE — Progress Notes (Signed)
[facility]   Subjective: No complaints, no SOB no chest/jaw discomfort.  Objective: Vital signs in last 24 hours: Temp:  [98.1 F (36.7 C)-98.5 F (36.9 C)] 98.1 F (36.7 C) (05/25 0625) Pulse Rate:  [58-85] 85 (05/25 0625) Resp:  [18-20] 18 (05/25 0625) BP: (134-179)/(56-78) 146/56 mmHg (05/25 0625) SpO2:  [96 %-97 %] 96 % (05/25 0625) Weight change:  Last BM Date: 02/04/13 Intake/Output from previous day: +840 05/24 0701 - 05/25 0700 In: 840 [P.O.:840] Out: -  Intake/Output this shift:    PE: General:Pleasant affect, NAD Heart:S1S2 RRR without murmur, gallup, rub or click Lungs:clear without rales, rhonchi, or wheezes AVW:UJWJ, non tender, + BS, do not palpate liver spleen or masses Ext:no lower ext edema  Tele:SR -SB 49-65, occ PVCs   EKG:  Mena Pauls at 14, no acute changes  Lab Results:  Recent Labs  02/04/13 2251 02/05/13 0523  WBC 10.1 10.6*  HGB 13.6 15.0  HCT 39.3 43.6  PLT 210 222   BMET  Recent Labs  02/04/13 1646 02/04/13 2251 02/05/13 0523  NA 137  --  139  K 4.2  --  4.3  CL 98  --  103  CO2 29  --  24  GLUCOSE 91  --  106*  BUN 15  --  12  CREATININE 0.86 0.81 0.78  CALCIUM 9.8  --  9.1    Recent Labs  02/05/13 0523 02/05/13 1011  TROPONINI <0.30 <0.30     Hepatic Function Panel  Recent Labs  02/05/13 0523  PROT 6.5  ALBUMIN 3.5  AST 15  ALT 25  ALKPHOS 62  BILITOT 0.4    Studies/Results: Dg Chest 2 View  02/04/2013   *RADIOLOGY REPORT*  Clinical Data: Jaw pain.  History of coronary artery disease with prior stenting.  Current history of hypertension and diabetes. Smoker.  CHEST - 2 VIEW  Comparison: Two-view chest x-ray 11/12/2012, 06/02/2011, 09/20/2009, 03/29/2007.  Findings: Cardiac silhouette normal in size, cardiac silhouette upper normal in size, unchanged.  Thoracic aorta mildly atherosclerotic, unchanged.  Hilar and mediastinal contours otherwise unremarkable.  Emphysematous  changes in the upper lobes and mild hyperinflation, unchanged.  Lungs clear.  Bronchovascular markings normal.  Pulmonary vascularity normal.  No pneumothorax. No pleural effusions.  Mild degenerative changes involving the thoracic spine.  Dorsal column stimulating device overlying the mid thoracic spine.  IMPRESSION: COPD/emphysema.  No acute cardiopulmonary disease.  Stable examination.   Original Report Authenticated By: Hulan Saas, M.D.    Medications: I have reviewed the patient's current medications. Scheduled Meds: . amLODipine  5 mg Oral Daily  . aspirin EC  325 mg Oral Daily  . atorvastatin  40 mg Oral Daily  . citalopram  20 mg Oral Daily  . enoxaparin (LOVENOX) injection  1 mg/kg Subcutaneous Q12H  . losartan  100 mg Oral Daily   And  . hydrochlorothiazide  25 mg Oral Daily  . insulin aspart  0-9 Units Subcutaneous TID WC  . isosorbide mononitrate  30 mg Oral Daily  . metoprolol tartrate  25 mg Oral BID  . metroNIDAZOLE   Topical BID  . mometasone-formoterol  2 puff Inhalation BID  . pantoprazole  40 mg Oral Daily  . sodium chloride  3 mL Intravenous Q12H   Continuous Infusions: . sodium chloride     PRN Meds:.acetaminophen, acetaminophen, hydrALAZINE, ondansetron (ZOFRAN) IV, ondansetron  Assessment/Plan: Principal Problem:   Chest pain, unstable angina, negative MI Active Problems:   Incisional  hernia   HTN (hypertension)   DM (diabetes mellitus)   CAD (coronary artery disease), hx of 2 stents to RCA in 2003, residual disease at that time of 40% LAD   Hyperlipidemia   Tobacco abuse   PLAN:  HR slower on EKG, but on tele 66-48 on BB. BP stable.  See Dr. Landry Dyke note, pt needs cath- ? Ambulate and if stable discharge to return Tuesday for cardiac cath.  Dr. Tresa Endo felt the cath would be most accurate test for this pt.   Pt has no complaints.   LOS: 2 days   Time spent with pt. :15 minutes. [facility]  Nurse Practitioner Certified Pager [phone] 02/06/2013,  8:18 AM

## 2013-02-07 ENCOUNTER — Observation Stay (HOSPITAL_COMMUNITY): Payer: Medicare Other

## 2013-02-07 DIAGNOSIS — E785 Hyperlipidemia, unspecified: Secondary | ICD-10-CM | POA: Diagnosis not present

## 2013-02-07 DIAGNOSIS — R079 Chest pain, unspecified: Secondary | ICD-10-CM

## 2013-02-07 DIAGNOSIS — I10 Essential (primary) hypertension: Secondary | ICD-10-CM | POA: Diagnosis not present

## 2013-02-07 DIAGNOSIS — F17200 Nicotine dependence, unspecified, uncomplicated: Secondary | ICD-10-CM | POA: Diagnosis not present

## 2013-02-07 DIAGNOSIS — I200 Unstable angina: Secondary | ICD-10-CM | POA: Diagnosis not present

## 2013-02-07 LAB — GLUCOSE, CAPILLARY
Glucose-Capillary: 164 mg/dL — ABNORMAL HIGH (ref 70–99)
Glucose-Capillary: 187 mg/dL — ABNORMAL HIGH (ref 70–99)

## 2013-02-07 MED ORDER — REGADENOSON 0.4 MG/5ML IV SOLN
0.4000 mg | Freq: Once | INTRAVENOUS | Status: AC
  Administered 2013-02-07: 0.4 mg via INTRAVENOUS

## 2013-02-07 MED ORDER — SODIUM CHLORIDE 0.9 % IJ SOLN
3.0000 mL | Freq: Two times a day (BID) | INTRAMUSCULAR | Status: DC
  Administered 2013-02-07: 3 mL via INTRAVENOUS

## 2013-02-07 MED ORDER — DIAZEPAM 5 MG PO TABS
5.0000 mg | ORAL_TABLET | ORAL | Status: AC
  Administered 2013-02-08: 5 mg via ORAL
  Filled 2013-02-07: qty 1

## 2013-02-07 MED ORDER — SODIUM CHLORIDE 0.9 % IV SOLN: 250.0000 mL | INTRAVENOUS | Status: DC | PRN

## 2013-02-07 MED ORDER — SODIUM CHLORIDE 0.9 % IJ SOLN: 3.0000 mL | INTRAMUSCULAR | Status: DC | PRN

## 2013-02-07 MED ORDER — TECHNETIUM TC 99M SESTAMIBI GENERIC - CARDIOLITE
30.0000 | Freq: Once | INTRAVENOUS | Status: AC | PRN
  Administered 2013-02-07: 30 via INTRAVENOUS

## 2013-02-07 MED ORDER — REGADENOSON 0.4 MG/5ML IV SOLN
INTRAVENOUS | Status: AC
  Filled 2013-02-07: qty 5

## 2013-02-07 MED ORDER — ASPIRIN 81 MG PO CHEW
324.0000 mg | CHEWABLE_TABLET | ORAL | Status: AC
  Administered 2013-02-08: 324 mg via ORAL
  Filled 2013-02-07: qty 4

## 2013-02-07 MED ORDER — SODIUM CHLORIDE 0.9 % IV SOLN
1.0000 mL/kg/h | INTRAVENOUS | Status: DC
  Administered 2013-02-08: 1 mL/kg/h via INTRAVENOUS

## 2013-02-07 MED ORDER — TECHNETIUM TC 99M SESTAMIBI GENERIC - CARDIOLITE
10.0000 | Freq: Once | INTRAVENOUS | Status: AC | PRN
  Administered 2013-02-07: 10 via INTRAVENOUS

## 2013-02-07 NOTE — Progress Notes (Signed)
Subjective: Patient seen in radiology, he remains pain-free. Cardiology note reviewed.  Objective: Vital signs in last 24 hours: Temp:  [98.2 F (36.8 C)-98.6 F (37 C)] 98.2 F (36.8 C) (05/26 1610) Pulse Rate:  [52-67] 63 (05/26 0916) Resp:  [16-18] 18 (05/26 0613) BP: (123-173)/(51-76) 159/62 mmHg (05/26 0916) SpO2:  [97 %-98 %] 98 % (05/26 9604) Weight:  [102.3 kg (225 lb 8.5 oz)] 102.3 kg (225 lb 8.5 oz) (05/26 5409) Weight change:  Last BM Date: 02/04/13  Intake/Output from previous day: 05/25 0701 - 05/26 0700 In: 1080 [P.O.:1080] Out: -  Intake/Output this shift:    General appearance: alert and cooperative Resp: clear to auscultation bilaterally Cardio: regular rate and rhythm, S1, S2 normal, no murmur, click, rub or gallop Extremities: extremities normal, atraumatic, no cyanosis or edema  Lab Results:  Results for orders placed during the hospital encounter of 02/04/13 (from the past 24 hour(s))  GLUCOSE, CAPILLARY     Status: Abnormal   Collection Time    02/06/13 12:18 PM      Result Value Range   Glucose-Capillary 116 (*) 70 - 99 mg/dL   Comment 1 Notify RN    GLUCOSE, CAPILLARY     Status: Abnormal   Collection Time    02/06/13  4:37 PM      Result Value Range   Glucose-Capillary 113 (*) 70 - 99 mg/dL   Comment 1 Notify RN    GLUCOSE, CAPILLARY     Status: Abnormal   Collection Time    02/06/13  9:07 PM      Result Value Range   Glucose-Capillary 136 (*) 70 - 99 mg/dL      Studies/Results: No results found.  Medications:  Prior to Admission:  Prescriptions prior to admission  Medication Sig Dispense Refill  . amLODipine (NORVASC) 5 MG tablet Take 5 mg by mouth daily.        [name] aspirin 81 MG tablet Take 81 mg by mouth daily.        [name] atorvastatin (LIPITOR) 40 MG tablet Take 40 mg by mouth daily.      . citalopram (CELEXA) 20 MG tablet Take 20 mg by mouth daily.      . Fluticasone-Salmeterol (ADVAIR) 100-50 MCG/DOSE AEPB Inhale 1 puff into  the lungs every 12 (twelve) hours.      [name] glucose blood test strip 1 each by Other route as needed. Use as instructed       . glucose monitoring kit (FREESTYLE) monitoring kit 1 each by Does not apply route as needed.        [name] losartan-hydrochlorothiazide (HYZAAR) 100-25 MG per tablet Take 1 tablet by mouth daily.      . metroNIDAZOLE (METROCREAM) 0.75 % cream Apply topically 2 (two) times daily.        [name] omeprazole (PRILOSEC) 20 MG capsule Take 40 mg by mouth daily.        Scheduled: . amLODipine  10 mg Oral Daily  . aspirin EC  325 mg Oral Daily  . atorvastatin  40 mg Oral Daily  . citalopram  20 mg Oral Daily  . enoxaparin (LOVENOX) injection  1 mg/kg Subcutaneous Q12H  . losartan  100 mg Oral Daily   And  . hydrochlorothiazide  25 mg Oral Daily  . insulin aspart  0-9 Units Subcutaneous TID WC  . isosorbide mononitrate  30 mg Oral Daily  . metoprolol tartrate  25 mg Oral BID  . metroNIDAZOLE   Topical BID  .  mometasone-formoterol  2 puff Inhalation BID  . pantoprazole  40 mg Oral Daily  . regadenoson      . sodium chloride  3 mL Intravenous Q12H   Continuous: . sodium chloride      Assessment/Plan: Principal Problem:   Chest pain, unstable angina, negative MI . Currently patient undergone Cardiolite, await results to determine if further risk stratification indicated, heart catheterization Active Problems:   Incisional hernia   HTN (hypertension)   DM (diabetes mellitus)   CAD (coronary artery disease), hx of 2 stents to RCA in 2003, residual disease at that time of 40% LAD   Hyperlipidemia   Tobacco abuse     LOS: 3 days   Loney Domingo D 02/07/2013, 9:47 AM

## 2013-02-07 NOTE — Progress Notes (Signed)
Lexiscan Cardiolite:  Findings: Raw images demonstrate slight patient motion and the significant subdiaphragmatic activity adjacent to the heart on both the rest and pharmacologic images; the examination is diagnostic, however.  Immediate post-regadenoson images demonstrate no focal myocardial perfusion abnormality. Initial resting images with similar findings. No evidence of reversibility to suggest ischemia. Findings confirmed by the computer generated polar map.  Discussed above with Dr. Tresa Endo.  With Hx. Of known CAD and stents to RCA and 40% LAD disease in 2003,+ DM, Hyperlipidemia, FH and continued smoking we are concerned for balanced ischemia. Additionally with slight pt motion on nuc. Dr. Tresa Endo prefers to proceed with cardiac cath.

## 2013-02-07 NOTE — Progress Notes (Signed)
[facility]   Subjective: No chest pain, no SOB   Objective: Vital signs in last 24 hours: Temp:  [98.2 F (36.8 C)-98.6 F (37 C)] 98.2 F (36.8 C) (05/26 1610) Pulse Rate:  [52-67] 63 (05/26 0916) Resp:  [16-18] 18 (05/26 0613) BP: (123-173)/(51-76) 159/62 mmHg (05/26 0916) SpO2:  [97 %-98 %] 98 % (05/26 9604) Weight:  [225 lb 8.5 oz (102.3 kg)] 225 lb 8.5 oz (102.3 kg) (05/26 5409) Weight change:  Last BM Date: 02/04/13 Intake/Output from previous day: 05/25 0701 - 05/26 0700 In: 1080 [P.O.:1080] Out: -  Intake/Output this shift:    PE: General:Pleasant affect, NAD Neck:supple, no JVD, no bruits  Heart:S1S2 RRR without gallup, rub or click; 1/6 sem Lungs:clear without rales, rhonchi, or wheezes WJX:BJYN, non tender, + BS, do not palpate liver spleen or masses Ext:no lower ext edema  Neuro:alert and oriented, MAE, follows commands   Lab Results:  Recent Labs  02/04/13 2251 02/05/13 0523  WBC 10.1 10.6*  HGB 13.6 15.0  HCT 39.3 43.6  PLT 210 222   BMET  Recent Labs  02/04/13 1646 02/04/13 2251 02/05/13 0523  NA 137  --  139  K 4.2  --  4.3  CL 98  --  103  CO2 29  --  24  GLUCOSE 91  --  106*  BUN 15  --  12  CREATININE 0.86 0.81 0.78  CALCIUM 9.8  --  9.1    Recent Labs  02/05/13 0523 02/05/13 1011  TROPONINI <0.30 <0.30    Hepatic Function Panel  Recent Labs  02/05/13 0523  PROT 6.5  ALBUMIN 3.5  AST 15  ALT 25  ALKPHOS 62  BILITOT 0.4   No results found for this basename: CHOL,  in the last 72 hours No results found for this basename: PROTIME,  in the last 72 hours      Studies/Results: No results found.  Medications: I have reviewed the patient's current medications. Scheduled Meds: . amLODipine  10 mg Oral Daily  . aspirin EC  325 mg Oral Daily  . atorvastatin  40 mg Oral Daily  . citalopram  20 mg Oral Daily  . enoxaparin (LOVENOX) injection  1 mg/kg Subcutaneous Q12H  . losartan  100  mg Oral Daily   And  . hydrochlorothiazide  25 mg Oral Daily  . insulin aspart  0-9 Units Subcutaneous TID WC  . isosorbide mononitrate  30 mg Oral Daily  . metoprolol tartrate  25 mg Oral BID  . metroNIDAZOLE   Topical BID  . mometasone-formoterol  2 puff Inhalation BID  . pantoprazole  40 mg Oral Daily  . regadenoson      . sodium chloride  3 mL Intravenous Q12H   Continuous Infusions: . sodium chloride     PRN Meds:.acetaminophen, acetaminophen, hydrALAZINE, ondansetron (ZOFRAN) IV, ondansetron  Assessment/Plan: Principal Problem:   Chest pain, unstable angina, negative MI Active Problems:   Incisional hernia   HTN (hypertension)   DM (diabetes mellitus)   CAD (coronary artery disease), hx of 2 stents to RCA in 2003, residual disease at that time of 40% LAD   Hyperlipidemia   Tobacco abuse  PLAN: no complaints, Lexiscan cardiolite completed without complications.  See MD note for further plan.  BP elevated today but has not yet had medication.  LOS: 3 days   Time spent with pt. Including MD time and not nuc time:15 minutes. [facility]  Nurse Practitioner Certified Pager  [phone] 02/07/2013, 9:26 AM    Patient seen and examined. Agree with assessment and plan. No further jaw or chest pain. He developed some shortness of breath with lexiscan cardiolite. No chest pain. Await results. Will  keep him on schedule for cath tomorrow.BP still elevated but he had not received his meds today.   Lennette Bihari, MD, [facility] 02/07/2013 10:06 AM

## 2013-02-08 ENCOUNTER — Encounter (HOSPITAL_COMMUNITY)
Admission: EM | Disposition: A | Discharge status: Home / Self Care | Payer: Self-pay | Source: Home / Self Care | Attending: Internal Medicine

## 2013-02-08 DIAGNOSIS — E785 Hyperlipidemia, unspecified: Secondary | ICD-10-CM | POA: Diagnosis not present

## 2013-02-08 DIAGNOSIS — E119 Type 2 diabetes mellitus without complications: Secondary | ICD-10-CM | POA: Diagnosis not present

## 2013-02-08 DIAGNOSIS — I2510 Atherosclerotic heart disease of native coronary artery without angina pectoris: Secondary | ICD-10-CM | POA: Diagnosis not present

## 2013-02-08 DIAGNOSIS — R6884 Jaw pain: Secondary | ICD-10-CM

## 2013-02-08 DIAGNOSIS — R079 Chest pain, unspecified: Secondary | ICD-10-CM | POA: Diagnosis not present

## 2013-02-08 DIAGNOSIS — I10 Essential (primary) hypertension: Secondary | ICD-10-CM | POA: Diagnosis not present

## 2013-02-08 DIAGNOSIS — F17200 Nicotine dependence, unspecified, uncomplicated: Secondary | ICD-10-CM | POA: Diagnosis not present

## 2013-02-08 HISTORY — PX: LEFT HEART CATHETERIZATION WITH CORONARY ANGIOGRAM: SHX5451

## 2013-02-08 LAB — CBC
Hemoglobin: 14.2 g/dL (ref 13.0–17.0)
MCHC: 34.6 g/dL (ref 30.0–36.0)
RDW: 13.5 % (ref 11.5–15.5)
WBC: 8.3 10*3/uL (ref 4.0–10.5)

## 2013-02-08 LAB — PROTIME-INR
INR: 0.98 (ref 0.00–1.49)
Prothrombin Time: 12.9 seconds (ref 11.6–15.2)

## 2013-02-08 LAB — PLATELET INHIBITION P2Y12: Platelet Function  P2Y12: 232 [PRU] (ref 194–418)

## 2013-02-08 LAB — BASIC METABOLIC PANEL
CO2: 28 mEq/L (ref 19–32)
Calcium: 9.1 mg/dL (ref 8.4–10.5)
GFR calc non Af Amer: 86 mL/min — ABNORMAL LOW (ref >90)
Potassium: 3.8 mEq/L (ref 3.5–5.1)
Sodium: 137 mEq/L (ref 135–145)

## 2013-02-08 SURGERY — LEFT HEART CATHETERIZATION WITH CORONARY ANGIOGRAM
Anesthesia: LOCAL

## 2013-02-08 MED ORDER — LIDOCAINE HCL (PF) 1 % IJ SOLN
INTRAMUSCULAR | Status: AC
  Filled 2013-02-08: qty 30

## 2013-02-08 MED ORDER — ASPIRIN 81 MG PO CHEW: 81.0000 mg | CHEWABLE_TABLET | Freq: Every day | ORAL | Status: DC

## 2013-02-08 MED ORDER — ISOSORBIDE MONONITRATE ER 60 MG PO TB24: 60.0000 mg | ORAL_TABLET | Freq: Every day | ORAL | Status: DC

## 2013-02-08 MED ORDER — VERAPAMIL HCL 2.5 MG/ML IV SOLN
INTRAVENOUS | Status: AC
  Filled 2013-02-08: qty 2

## 2013-02-08 MED ORDER — AMLODIPINE BESYLATE 10 MG PO TABS: 10.0000 mg | ORAL_TABLET | Freq: Every day | ORAL | Status: DC

## 2013-02-08 MED ORDER — HEPARIN (PORCINE) IN NACL 2-0.9 UNIT/ML-% IJ SOLN
INTRAMUSCULAR | Status: AC
  Filled 2013-02-08: qty 1000

## 2013-02-08 MED ORDER — ONDANSETRON HCL 4 MG/2ML IJ SOLN: 4.0000 mg | Freq: Four times a day (QID) | INTRAMUSCULAR | Status: DC | PRN

## 2013-02-08 MED ORDER — METOPROLOL TARTRATE 25 MG PO TABS: 25.0000 mg | ORAL_TABLET | Freq: Two times a day (BID) | ORAL | Status: DC

## 2013-02-08 MED ORDER — HEPARIN SODIUM (PORCINE) 1000 UNIT/ML IJ SOLN
INTRAMUSCULAR | Status: AC
  Filled 2013-02-08: qty 1

## 2013-02-08 MED ORDER — SODIUM CHLORIDE 0.9 % IV SOLN: INTRAVENOUS | Status: AC

## 2013-02-08 MED ORDER — ACETAMINOPHEN 325 MG PO TABS: 650.0000 mg | ORAL_TABLET | ORAL | Status: DC | PRN

## 2013-02-08 MED ORDER — MIDAZOLAM HCL 2 MG/2ML IJ SOLN
INTRAMUSCULAR | Status: AC
  Filled 2013-02-08: qty 2

## 2013-02-08 NOTE — Progress Notes (Signed)
TR BAND REMOVAL  LOCATION:  right radial  DEFLATED PER PROTOCOL:  yes  TIME BAND OFF / DRESSING APPLIED:   1600   SITE UPON ARRIVAL:   Level 0  SITE AFTER BAND REMOVAL:  Level 0  REVERSE ALLEN'S TEST:    positive  CIRCULATION SENSATION AND MOVEMENT:  Within Normal Limits  yes  COMMENTS:    

## 2013-02-08 NOTE — Discharge Summary (Signed)
Physician Discharge Summary  NAME:Albert Powell  XLK:[id number]  DOB: 07/23/1941   Admit date: 02/04/2013 Discharge date: 02/08/2013  Admitting Diagnosis: chest pain, possible ACS or unstable angina  Discharge Diagnoses:  Active Hospital Problems   Diagnosis Date Noted  . Chest pain, unstable angina, negative MI 02/04/2013  . Hyperlipidemia 02/05/2013  . Tobacco abuse 02/05/2013  . HTN (hypertension) 02/04/2013  . DM (diabetes mellitus) 02/04/2013  . CAD (coronary artery disease), hx of 2 stents to RCA in 2003, residual disease at that time of 40% LAD 02/04/2013  . Incisional hernia 05/22/2011    Resolved Hospital Problems   Diagnosis Date Noted Date Resolved  No resolved problems to display.    Discharge Condition: improved  Hospital Course: Patient was admitted and on basis of serial enzymes and EKG, an MI was ruled out. He was treated with additional meds and, although he had one more episode of jaw discomfort, he otherwise had no further symptoms.   He was seen in consultation by cardiology. A Myoview was done but was negative. However, there was concern for balanced ischemia. Given his past hx and his continuing risk factors, he underwent cath. Cath results showed some disease in small branches and medical therapy was recommended.   Importance of risk factor modification was emphasized. His diabetic control is adequate (A1c 6.6), but his BP was up and his diet has been poor. He also has continued to smoke. Appropriate approaches to all these problems were discussed with the patient.   Consults: Cardiology  Disposition: Home  Discharge Orders   Future Orders Complete By Expires     Diet - low sodium heart healthy  As directed     Discharge instructions  As directed     Comments:      Please resume your exercise and cut back on smoking.    Increase activity slowly  As directed         Medication List    TAKE these medications       amLODipine 10 MG tablet   Commonly known as:  NORVASC  Take 1 tablet (10 mg total) by mouth daily.     aspirin 81 MG tablet  Take 81 mg by mouth daily.     atorvastatin 40 MG tablet  Commonly known as:  LIPITOR  Take 40 mg by mouth daily.     citalopram 20 MG tablet  Commonly known as:  CELEXA  Take 20 mg by mouth daily.     Fluticasone-Salmeterol 100-50 MCG/DOSE Aepb  Commonly known as:  ADVAIR  Inhale 1 puff into the lungs every 12 (twelve) hours.     glucose blood test strip  1 each by Other route as needed. Use as instructed     glucose monitoring kit monitoring kit  1 each by Does not apply route as needed.     isosorbide mononitrate 60 MG 24 hr tablet  Commonly known as:  IMDUR  Take 1 tablet (60 mg total) by mouth daily.  Start taking on:  02/09/2013     losartan-hydrochlorothiazide 100-25 MG per tablet  Commonly known as:  HYZAAR  Take 1 tablet by mouth daily.     metoprolol tartrate 25 MG tablet  Commonly known as:  LOPRESSOR  Take 1 tablet (25 mg total) by mouth 2 (two) times daily.     metroNIDAZOLE 0.75 % cream  Commonly known as:  METROCREAM  Apply topically 2 (two) times daily.     omeprazole 20 MG capsule  Commonly known as:  PRILOSEC  Take 40 mg by mouth daily.           Follow-up Information   Follow up with [name], MD. (03/10/12 at 1:45 PM)    Contact information:   [address], [address] [place] [address] [phone]       Follow up with [name], MD. (Please call to see Dr. Allyson Sabal or one of his assistants in the next two weeks. )    Contact information:   [address] [place] [address] [phone]       Things to follow up in the outpatient setting: emphasize need to quit smoking entirely.   Time coordinating discharge: 25 minutes including medication reconciliation, transmission of prescriptions to pharmacy, preparation of discharge papers, and discussion with  patient     Signed: DONG, NIMMONS 02/08/2013, 1:49 PM

## 2013-02-08 NOTE — CV Procedure (Signed)
Albert Powell is a 72 y.o. male    [id number] LOCATION:  FACILITY: [facility]  PHYSICIAN: Albert Powell, M.D. Jan 26, 1941   DATE OF PROCEDURE:  02/08/2013  DATE OF DISCHARGE:  [facility]  CARDIAC CATHETERIZATION     History obtained from chart review. 72 y.o. male with known history of CAD status post stenting with 2 stents to RCA in 2003 and residual LAD, hypertension and hyperlipidemia and diabetes mellitus on diet, presented to the ER because of jaw discomfort with chest pressure for 2-3 days. Patient's symptoms usually happen in the night. Denies any shortness of breath diaphoresis nausea vomiting or palpitations. In the ER chest x-ray EKG and cardiac enzymes were unremarkable and patient has been admitted for further management and rule out ACS. He ruled out for myocardial infarction by enzymes. He had a Myoview stress test that was nonischemic. Because the symptoms were similar to his pre-stent symptoms I elected to perform cardiac catheterization to rule out ischemic etiology.    PROCEDURE DESCRIPTION:    The patient was brought to the second floor [facility] cath lab in the postabsorptive state. He was premedicated with Valium 5 mg by mouth, IV Versed.[name] His right wrist was prepped and shaved in usual sterile fashion. Xylocaine 1% was used for local anesthesia. A 5 French sheath was inserted into the right radial  artery using standard Seldinger technique. The patient received 5000 units  of heparin  intravenously.  A 5 [place] TIG catheter and pigtail catheters were used for selective coronary angiography and left ventriculography respectively. Visipaque dye was used for the entirety of the case. Retrograde aorta, left ventricular and pulmonary pressures were recorded.    HEMODYNAMICS:    AO SYSTOLIC/AO DIASTOLIC: 112/47   LV SYSTOLIC/LV DIASTOLIC: on 10/11  ANGIOGRAPHIC RESULTS:   1. Left main; normal  2. LAD; 80-90% focal distal and a  1.5-1.7 mm vessel 3. Left circumflex; nondominant with a 90% AV groove circumflex stenosis and a small vessel. There was a moderate to large obtuse marginal branch that had a 60% proximal segmental calcified stenosis.[name]  4. Right coronary artery; dominant with anterior takeoff, widely patent stents with no other significant disease 5. Left ventriculography; RAO left ventriculogram was performed using  25 mL of Visipaque dye at 12 mL/second. The overall LVEF estimated  60 % Without wall motion abnormalities  IMPRESSION:Albert Powell has a widely patent stent in the dominant RCA with high-grade disease in his apical LAD and mid AV groove circumflex which are small vessels. He does have moderate disease in a large first obtuse marginal branch with normal LV function. At this point I'm going to treat him medically and increase his Imdur from 30-60 mg a day. If he has recalcitrant symptoms all consider intervention on his LAD was a moderate circumflex and obtuse marginal branches. The sheath was removed and  A TR band was placed on the right wrist to achieve patent hemostasis. The patient left the Cath Lab in stable condition. He'll be gently hydrated for 2 hours and discharged from with close followup in the office by a mid-level provider.  [name] MD, [facility] 02/08/2013 11:50 AM

## 2013-02-08 NOTE — Progress Notes (Signed)
Subjective:  No further jaw or chest pain  Objective:  Temp:  [98 F (36.7 C)-98.5 F (36.9 C)] 98 F (36.7 C) (05/27 0551) Pulse Rate:  [51-67] 51 (05/27 0551) Resp:  [18] 18 (05/27 0551) BP: (136-173)/(53-76) 157/71 mmHg (05/27 0551) SpO2:  [96 %-99 %] 99 % (05/27 0551) Weight:  [228 lb 9.9 oz (103.7 kg)] 228 lb 9.9 oz (103.7 kg) (05/27 0551) Weight change: 3 lb 1.4 oz (1.4 kg)  Intake/Output from previous day:    Intake/Output from this shift:    Physical Exam: General appearance: alert and no distress Neck: no adenopathy, no carotid bruit, no JVD, supple, symmetrical, trachea midline and thyroid not enlarged, symmetric, no tenderness/mass/nodules Lungs: clear to auscultation bilaterally Heart: regular rate and rhythm, S1, S2 normal, no murmur, click, rub or gallop Extremities: extremities normal, atraumatic, no cyanosis or edema  Lab Results: Results for orders placed during the hospital encounter of 02/04/13 (from the past 48 hour(s))  GLUCOSE, CAPILLARY     Status: Abnormal   Collection Time    02/06/13 12:18 PM      Result Value Range   Glucose-Capillary 116 (*) 70 - 99 mg/dL   Comment 1 Notify RN    GLUCOSE, CAPILLARY     Status: Abnormal   Collection Time    02/06/13  4:37 PM      Result Value Range   Glucose-Capillary 113 (*) 70 - 99 mg/dL   Comment 1 Notify RN    GLUCOSE, CAPILLARY     Status: Abnormal   Collection Time    02/06/13  9:07 PM      Result Value Range   Glucose-Capillary 136 (*) 70 - 99 mg/dL  GLUCOSE, CAPILLARY     Status: Abnormal   Collection Time    02/07/13 10:20 AM      Result Value Range   Glucose-Capillary 159 (*) 70 - 99 mg/dL   Comment 1 Notify RN    GLUCOSE, CAPILLARY     Status: Abnormal   Collection Time    02/07/13 11:30 AM      Result Value Range   Glucose-Capillary 164 (*) 70 - 99 mg/dL   Comment 1 Notify RN    GLUCOSE, CAPILLARY     Status: Abnormal   Collection Time    02/07/13  5:02 PM      Result Value Range    Glucose-Capillary 187 (*) 70 - 99 mg/dL  GLUCOSE, CAPILLARY     Status: Abnormal   Collection Time    02/07/13  8:56 PM      Result Value Range   Glucose-Capillary 121 (*) 70 - 99 mg/dL  BASIC METABOLIC PANEL     Status: Abnormal   Collection Time    02/08/13  5:15 AM      Result Value Range   Sodium 137  135 - 145 mEq/L   Potassium 3.8  3.5 - 5.1 mEq/L   Chloride 99  96 - 112 mEq/L   CO2 28  19 - 32 mEq/L   Glucose, Bld 93  70 - 99 mg/dL   BUN 16  6 - 23 mg/dL   Creatinine, Ser 1.61  0.50 - 1.35 mg/dL   Calcium 9.1  8.4 - 09.6 mg/dL   GFR calc non Af Amer 86 (*) >90 mL/min   GFR calc Af Amer >90  >90 mL/min   Comment:            The eGFR has been calculated  using the CKD EPI equation.     This calculation has not been     validated in all clinical     situations.     eGFR's persistently     <90 mL/min signify     possible Chronic Kidney Disease.  PROTIME-INR     Status: None   Collection Time    02/08/13  5:15 AM      Result Value Range   Prothrombin Time 12.9  11.6 - 15.2 seconds   INR 0.98  0.00 - 1.49  CBC     Status: None   Collection Time    02/08/13  5:15 AM      Result Value Range   WBC 8.3  4.0 - 10.5 K/uL   RBC 4.59  4.22 - 5.81 MIL/uL   Hemoglobin 14.2  13.0 - 17.0 g/dL   HCT 09.8  11.9 - 14.7 %   MCV 89.3  78.0 - 100.0 fL   MCH 30.9  26.0 - 34.0 pg   MCHC 34.6  30.0 - 36.0 g/dL   RDW 82.9  56.2 - 13.0 %   Platelets 203  150 - 400 K/uL  PLATELET INHIBITION P2Y12     Status: None   Collection Time    02/08/13  5:15 AM      Result Value Range   Platelet Function  P2Y12 232  194 - 418 PRU   Comment:            The literature has shown a direct     correlation of PRU values over     230 with higher risks of     thrombotic events.  Lower PRU     values are associated with     platelet inhibition.  GLUCOSE, CAPILLARY     Status: Abnormal   Collection Time    02/08/13  7:38 AM      Result Value Range   Glucose-Capillary 102 (*) 70 - 99 mg/dL     Imaging: Imaging results have been reviewed  Assessment/Plan:   1. Principal Problem: 2.   Chest pain, unstable angina, negative MI 3. Active Problems: 4.   Incisional hernia 5.   HTN (hypertension) 6.   DM (diabetes mellitus) 7.   CAD (coronary artery disease), hx of 2 stents to RCA in 2003, residual disease at that time of 40% LAD 8.   Hyperlipidemia 9.   Tobacco abuse 10.   Time Spent Directly with Patient:  20 minutes  Length of Stay:  LOS: 4 days   Pt well known to me with h/o CAD s/p PCI/Stent by Dr. Wonda Olds in [place] 2003 with 40% LAD lesion. He has been stable for 11 years until last week when he developed jaw pain similar to his symptoms prior to his PCI. He was admitted Fri and had an episode of jaw pain on Sat. His enz have been neg, EKGs w/o acute changes and MPI neg for ischemia. Despite a lack of objective evidence to support a Dx of [place], I am worried that we are missing his true Dx and both the pt and I feel strongly about proceeding to cath later today to define his anatomy. I will do radially. Exam benign. Labs OK.  [name] 02/08/2013, 8:28 AM

## 2013-02-08 NOTE — Progress Notes (Signed)
Assessment/Plan: Principal Problem:   Chest pain, unstable angina, negative MI - plans made for cath today. No new discomfort.  Active Problems:   Incisional hernia   HTN (hypertension)   DM (diabetes mellitus)   CAD (coronary artery disease), hx of 2 stents to RCA in 2003, residual disease at that time of 40% LAD   Hyperlipidemia   Tobacco abuse   Subjective: No chest discomfort overnight. Feels fine. Ready for cath  Objective:  Vital Signs: Filed Vitals:   02/07/13 2100 02/07/13 2112 02/07/13 2139 02/08/13 0551  BP: 147/60   157/71  Pulse: 54  60 51  Temp: 98 F (36.7 C)   98 F (36.7 C)  TempSrc: Oral   Oral  Resp: 18   18  Height:      Weight:    103.7 kg (228 lb 9.9 oz)  SpO2: 98% 96%  99%     EXAM: alert and comfortable  No intake or output data in the 24 hours ending 02/08/13 0720  Lab Results:  Recent Labs  02/08/13 0515  NA 137  K 3.8  CL 99  CO2 28  GLUCOSE 93  BUN 16  CREATININE 0.86  CALCIUM 9.1   No results found for this basename: AST, ALT, ALKPHOS, BILITOT, PROT, ALBUMIN,  in the last 72 hours No results found for this basename: LIPASE, AMYLASE,  in the last 72 hours  Recent Labs  02/08/13 0515  WBC 8.3  HGB 14.2  HCT 41.0  MCV 89.3  PLT 203    Recent Labs  02/05/13 1011  TROPONINI <0.30   No components found with this basename: POCBNP,  No results found for this basename: DDIMER,  in the last 72 hours No results found for this basename: HGBA1C,  in the last 72 hours No results found for this basename: CHOL, HDL, LDLCALC, TRIG, CHOLHDL, LDLDIRECT,  in the last 72 hours No results found for this basename: TSH, T4TOTAL, FREET3, T3FREE, THYROIDAB,  in the last 72 hours No results found for this basename: VITAMINB12, FOLATE, FERRITIN, TIBC, IRON, RETICCTPCT,  in the last 72 hours  Studies/Results: Nm Myocar Multi W/spect W/wall Motion / Ef  02/07/2013   *RADIOLOGY REPORT*  Clinical Data:  72 year old with prior history of  coronary artery disease with stenting, presenting this admission with chest pain and discomfort.  MYOCARDIAL IMAGING WITH SPECT (REST AND PHARMACOLOGIC-STRESS) GATED LEFT VENTRICULAR WALL MOTION STUDY LEFT VENTRICULAR EJECTION FRACTION  Technique:  Standard myocardial SPECT imaging was performed after resting intravenous injection of 10 mCi Tc-66m sestamibi. Subsequently, intravenous infusion of regadenoson was performed under the supervision of the Cardiology staff.  At peak effect of the drug, 30 mCi Tc-53m sestamibi was injected intravenously and standard myocardial SPECT  imaging was performed.  Quantitative gated imaging was also performed to evaluate left ventricular wall motion, and estimate left ventricular ejection fraction.  Comparison:  None.  Findings: Raw images demonstrate slight patient motion and the significant subdiaphragmatic activity adjacent to the heart on both the rest and pharmacologic images; the examination is diagnostic, however.  Immediate post-regadenoson images demonstrate no focal myocardial perfusion abnormality.  Initial resting images with similar findings.  No evidence of reversibility to suggest ischemia. Findings confirmed by the computer generated polar map.  Gated images demonstrate satisfactory thickening throughout the left ventricular myocardium with normal wall motion throughout.  Estimated Q G S ejection fraction measured 64%, with an end- diastolic volume of 117 ml and an end-systolic volume of 42 ml.  IMPRESSION:  1.  No evidence of myocardial ischemia or infarction. 2.  Normal left ventricular wall motion. 3.  Estimated Q G S ejection fraction 64%.   Original Report Authenticated By: Hulan Saas, M.D.   Medications: Medications administered in the last 24 hours reviewed.  Current Medication List reviewed.    LOS: 4 days   [name] Internal Medicine @ [facility] [phone]) 02/08/2013, 7:20 AM

## 2013-02-08 NOTE — Progress Notes (Signed)
Utilization Review Completed Venna Berberich J. Iasia Forcier, RN, BSN, NCM [phone]  

## 2013-02-08 NOTE — H&P (Signed)
    Pt was reexamined and existing H & P reviewed. No changes found.  [name], MD [facility] 02/08/2013 11:09 AM

## 2013-02-18 ENCOUNTER — Encounter: Payer: Self-pay | Admitting: Cardiology

## 2013-02-18 ENCOUNTER — Ambulatory Visit (INDEPENDENT_AMBULATORY_CARE_PROVIDER_SITE_OTHER): Payer: Medicare Other | Admitting: Cardiology

## 2013-02-18 VITALS — BP 114/68 | HR 46 | Ht 68.5 in | Wt 227.5 lb

## 2013-02-18 DIAGNOSIS — F17200 Nicotine dependence, unspecified, uncomplicated: Secondary | ICD-10-CM

## 2013-02-18 DIAGNOSIS — Z720 Tobacco use: Secondary | ICD-10-CM

## 2013-02-18 DIAGNOSIS — I2510 Atherosclerotic heart disease of native coronary artery without angina pectoris: Secondary | ICD-10-CM | POA: Diagnosis not present

## 2013-02-18 DIAGNOSIS — I498 Other specified cardiac arrhythmias: Secondary | ICD-10-CM | POA: Diagnosis not present

## 2013-02-18 DIAGNOSIS — E119 Type 2 diabetes mellitus without complications: Secondary | ICD-10-CM | POA: Diagnosis not present

## 2013-02-18 DIAGNOSIS — E785 Hyperlipidemia, unspecified: Secondary | ICD-10-CM

## 2013-02-18 DIAGNOSIS — R001 Bradycardia, unspecified: Secondary | ICD-10-CM

## 2013-02-18 HISTORY — DX: Bradycardia, unspecified: R00.1

## 2013-02-18 MED ORDER — METOPROLOL TARTRATE 25 MG PO TABS: 12.5000 mg | ORAL_TABLET | Freq: Two times a day (BID) | ORAL | Status: DC

## 2013-02-18 NOTE — Assessment & Plan Note (Signed)
No change 

## 2013-02-18 NOTE — Progress Notes (Signed)
02/18/2013   PCP: [name], MD   Chief Complaint  Patient presents with  . ROV 1-2 weeks    SOB, lightheadedness and weakness in legs    Primary Cardiologist: Dr. Allyson Sabal  HPI:  72 y.o. male with known history of CAD status post stenting with 2 stents to RCA in 2003 and residual LAD, hypertension and hyperlipidemia and diabetes mellitus on diet, presented to the ER because of jaw discomfort with chest pressure for 2-3 days. Patient's symptoms usually happen in the night. Denies any shortness of breath diaphoresis nausea vomiting or palpitations. In the ER chest x-ray EKG and cardiac enzymes were unremarkable.   Patient was negative for myocardial infarction he had a LexiScan Myoview 02/07/13 which was negative for ischemia, but due to similarities of his prior coronary disease and the symptoms and multiple risk factors for coronary disease including family history hypertension diabetes known coronary artery disease patient was scheduled for cardiac catheterization with Dr. Allyson Sabal.    Cath revealed: ANGIOGRAPHIC RESULTS:  1. Left main; normal  2. LAD; 80-90% focal distal and a 1.5-1.7 mm vessel  3. Left circumflex; nondominant with a 90% AV groove circumflex stenosis and a small vessel. There was a moderate to large obtuse marginal branch that had a 60% proximal segmental calcified stenosis.Albert Powell  4. Right coronary artery; dominant with anterior takeoff, widely patent stents with no other significant disease  5. Left ventriculography; RAO left ventriculogram was performed using  25 mL of Visipaque dye at 12 mL/second. The overall LVEF estimated  60 % Without wall motion abnormalities  Dr. Allyson Sabal recommended medical therapy though if he had recalcitrant symptoms then intervention to the LAD and circumflex would be undertaken.   He now presents for followup. He complains of weakness in his legs, continued shortness of breath and some lightheadedness.  The shortness of breath  has not changed-he had some shortness of breath with exertion in the hospital.  He also has COPD.  He denies any jaw pain which is actually what brought him to the hospital.  Today his heart rate is much slower at 46. This is a sinus bradycardia and it was done just after he walked in from the waiting room.  Allergies  Allergen Reactions  . Fentanyl     unknown  . Gabapentin     unknown  . Lisinopril     unknown  . Lyrica (Pregabalin)     unknown    Current Outpatient Prescriptions  Medication Sig Dispense Refill  . amLODipine (NORVASC) 10 MG tablet Take 1 tablet by mouth every evening.      Albert Powell aspirin 81 MG tablet Take 81 mg by mouth daily.        Albert Powell atorvastatin (LIPITOR) 40 MG tablet Take 40 mg by mouth daily.      . citalopram (CELEXA) 20 MG tablet Take 20 mg by mouth daily.      . Fluticasone-Salmeterol (ADVAIR) 100-50 MCG/DOSE AEPB Inhale 1 puff into the lungs every 12 (twelve) hours.      Albert Powell glucose blood test strip 1 each by Other route as needed. Use as instructed       . glucose monitoring kit (FREESTYLE) monitoring kit 1 each by Does not apply route as needed.        Albert Powell HYDROcodone-acetaminophen (NORCO) 10-325 MG per tablet Take 1 tablet by mouth as needed.      . isosorbide mononitrate (IMDUR) 60 MG 24 hr tablet Take 1 tablet (  60 mg total) by mouth daily.      Albert Powell losartan-hydrochlorothiazide (HYZAAR) 100-25 MG per tablet Take 1 tablet by mouth every evening.      . metoprolol tartrate (LOPRESSOR) 25 MG tablet Take 0.5 tablets (12.5 mg total) by mouth 2 (two) times daily.      . metroNIDAZOLE (METROCREAM) 0.75 % cream Apply topically 2 (two) times daily.        Albert Powell omeprazole (PRILOSEC) 20 MG capsule Take 40 mg by mouth daily.        No current facility-administered medications for this visit.    Past Medical History  Diagnosis Date  . Onychomycosis   . GERD (gastroesophageal reflux disease)   . History of colonoscopy 2004    finding of tics and AMV only no polpys  . DDD  (degenerative disc disease) 2008  . Cataract   . Diabetes mellitus   . Rosacea   . Hypertension   . COPD (chronic obstructive pulmonary disease)   . Hyperlipidemia 02/05/2013  . Tobacco abuse 02/05/2013  . Claudication     LEA DUPLEX, 07/07/2008 - Normal  . CAD (coronary artery disease)     stent to RCA 2003 also had 40% lesion at that time; NUCLEAR STRESS TEST, 01/16/2010 - normal  now with cath 80-90% stenosis in LAD, will try medical therapy if no improvement PCI  . Bradycardia, sinus 02/18/2013    Past Surgical History  Procedure Laterality Date  . Colon surgery  2005    renal pouch rectal anastimosis  . Cardiac surgery    . Coronary angioplasty with stent placement  09/2002    2 stents to RCA  . Cardiac catheterization  08/29/2002    2-vessel CAD with high-grade stenoses in RCA; stenting to RCA  . Cardiac catheterization  2014    80-90% lesion will try medical therapy    ZOX:WRUEAVW:UJ colds or fevers, no weight changes, + weakness Skin:no rashes or ulcers HEENT:no blurred vision, no congestion CV:see HPI PUL:see HPI GI:no diarrhea constipation or melena, no indigestion GU:no hematuria, no dysuria MS:no joint pain, no claudication Neuro:no syncope, mild lightheadedness Endo:+ diabetes, no thyroid disease  PHYSICAL EXAM BP 114/68  Pulse 46  Ht 5' 8.5" (1.74 m)  Wt 227 lb 8 oz (103.193 kg)  BMI 34.08 kg/m2 General:Pleasant affect, NAD Skin:Warm and dry, brisk capillary refill HEENT:normocephalic, sclera clear, mucus membranes moist Neck:supple, no JVD, no bruits  Heart:S1S2 RRR slow--without murmur, gallup, rub or click Lungs:clear without rales, rhonchi, or wheezes WJX:BJYNW,GNFA, non tender, + BS, do not palpate liver spleen or masses Ext:no lower ext edema, 2+ pedal pulses, 2+ radial pulses Neuro:alert and oriented, MAE, follows commands, + facial symmetry  EKG: Sinus bradycardia rate of 46 but no acute changes other than the slow heart rate.  ASSESSMENT AND  PLAN Bradycardia, sinus Associated with weakness.  Decrease lopressor to 12.5 mg twice a day, though only take 12.5 mg once a day for 3 days then if feels better increase to 12.5 mg daily.  Will have pt seen in 1 week for follow up with myself or Dr. Allyson Sabal.  He will call if further problems.   CAD (coronary artery disease), hx of 2 stents to RCA in 2003, residual disease at that time of 40% LAD, now 80-90% stenosis of LAD continues with SOB with exertion, but no Jaw pain.  Continue meds though having to decrease BB, and BP borderline so am not able to titrate up nitrate.  Instructed to go to ER for  increased SOB or return of jaw pain.   DM (diabetes mellitus) No change  Tobacco abuse Discussed stopping again.  He has cut back since discharge with plans to stop.  Hyperlipidemia Stable

## 2013-02-18 NOTE — Assessment & Plan Note (Signed)
Discussed stopping again.  He has cut back since discharge with plans to stop.

## 2013-02-18 NOTE — Assessment & Plan Note (Signed)
Stable

## 2013-02-18 NOTE — Assessment & Plan Note (Addendum)
continues with SOB with exertion, but no Jaw pain.  Continue meds though having to decrease BB, and BP borderline so am not able to titrate up nitrate.  Instructed to go to ER for increased SOB or return of jaw pain.

## 2013-02-18 NOTE — Assessment & Plan Note (Addendum)
Associated with weakness.  Decrease lopressor to 12.5 mg twice a day, though only take 12.5 mg once a day for 3 days then if feels better increase to 12.5 mg daily.  Will have pt seen in 1 week for follow up with myself or Dr. Allyson Sabal.  He will call if further problems.

## 2013-02-18 NOTE — Patient Instructions (Addendum)
Decrease metoprolol to half a tablet (12.5 mg) once a day from whole tablet (25 mg) twice a day, for today , Sat and Sunday, if you feel better then increase to half a tablet twice a day. because your heart rate is slow.   Call if increasing symptoms.  Go to ER if Jaw pain re-occurs.  Follow up in 1 week with Dr. Allyson Sabal or if his schedule is full with [name], NP-C on a day that Dr. Allyson Sabal is here.

## 2013-02-24 ENCOUNTER — Encounter: Payer: Self-pay | Admitting: Cardiovascular Disease

## 2013-02-24 ENCOUNTER — Ambulatory Visit (INDEPENDENT_AMBULATORY_CARE_PROVIDER_SITE_OTHER): Payer: Medicare Other | Admitting: Cardiology

## 2013-02-24 ENCOUNTER — Encounter: Payer: Self-pay | Admitting: Cardiology

## 2013-02-24 VITALS — BP 118/60 | HR 49 | Ht 69.0 in | Wt 225.9 lb

## 2013-02-24 DIAGNOSIS — F17200 Nicotine dependence, unspecified, uncomplicated: Secondary | ICD-10-CM | POA: Diagnosis not present

## 2013-02-24 DIAGNOSIS — E785 Hyperlipidemia, unspecified: Secondary | ICD-10-CM | POA: Diagnosis not present

## 2013-02-24 DIAGNOSIS — I498 Other specified cardiac arrhythmias: Secondary | ICD-10-CM | POA: Diagnosis not present

## 2013-02-24 DIAGNOSIS — I2510 Atherosclerotic heart disease of native coronary artery without angina pectoris: Secondary | ICD-10-CM

## 2013-02-24 DIAGNOSIS — I10 Essential (primary) hypertension: Secondary | ICD-10-CM

## 2013-02-24 DIAGNOSIS — E119 Type 2 diabetes mellitus without complications: Secondary | ICD-10-CM

## 2013-02-24 DIAGNOSIS — Z720 Tobacco use: Secondary | ICD-10-CM

## 2013-02-24 DIAGNOSIS — R001 Bradycardia, unspecified: Secondary | ICD-10-CM

## 2013-02-24 MED ORDER — NITROGLYCERIN 0.4 MG SL SUBL: 0.4000 mg | SUBLINGUAL_TABLET | SUBLINGUAL | Status: DC | PRN

## 2013-02-24 MED ORDER — METOPROLOL SUCCINATE ER 25 MG PO TB24: ORAL_TABLET | ORAL | Status: DC

## 2013-02-24 NOTE — Assessment & Plan Note (Signed)
Controlled.  

## 2013-02-24 NOTE — Assessment & Plan Note (Signed)
Patient is cutting back on his tobacco and is down to only one third pack a day he was congratulated on this.

## 2013-02-24 NOTE — Assessment & Plan Note (Signed)
Symptomatic, has improved now that we have decreased his Lopressor originally when he came into the room his heart rate was 60 after sitting for 10-15 minutes his EKG revealed sinus bradycardia rate of 48. He has no further symptoms and is actually feeling much better. I am stopping his Lopressor to one half twice a day and placing him on Toprol-XL 12.5 mg daily he'll take it in the morning

## 2013-02-24 NOTE — Assessment & Plan Note (Signed)
Followed by primary care.   

## 2013-02-24 NOTE — Patient Instructions (Addendum)
I am changing your Lopressor ( metoprolol tartrate) to Toprol XL (metorprolol succinate) 25 mg only take half a tab a day in the AM.  Take 1 NTG, under your tongue, while sitting.  If no relief of pain may repeat NTG, one tab every 5 minutes up to 3 tablets total over 15 minutes.  If no relief CALL 911.  If you have dizziness/lightheadness  while taking NTG, stop taking and call 911.          Follow up with Dr. Allyson Sabal in 4-6 weeks.  Call if any problems in the mean time.

## 2013-02-24 NOTE — Progress Notes (Signed)
02/24/2013   PCP: [name], MD   Chief Complaint  Patient presents with  . follow up 1 week    med change from last visit; "much better" according to pt    Primary Cardiologist:Dr. Erlene Quan    HPI: 72 y.o. male with known history of CAD status post stenting with 2 stents to RCA in 2003 and residual LAD, hypertension and hyperlipidemia and diabetes mellitus on diet, presented to the ER because of jaw discomfort with chest pressure for 2-3 days. Patient's symptoms usually happen in the night. .  Patient was negative for myocardial infarction he had a LexiScan Myoview 02/07/13 which was negative for ischemia, but due to similarities of his prior coronary disease and the symptoms and multiple risk factors for coronary disease including family history hypertension diabetes known coronary artery disease patient was scheduled for cardiac catheterization with Dr. Allyson Sabal.  Revealing 80-90% focal distal LAD disease and a 1.5-1.7 mm vessel left circumflex nondominant with a 90% AV groove stenosis in a small vessel there was a moderate to large obtuse marginal branch with a 60% proximal segmental calcified stenosis. The RCA had widely patent stents. EF was 60%. Due to the small size of the vessels and the negative nuclear stress test that are very filled medical therapy was the patient's best option.  Patient was seen back in followup last week with a heart rate of 48 symptomatic with weakness feeling very tired. He still had some shortness of breath but no jaw pain which is his anginal equivalent. Medications were adjusted because Lopressor actually had him hold it one day and then he was started back half a tablet daily he felt better so he is now resumed half tablet twice a day.  Today's back for followup initially his heart rate was 60 after he walked in the room but after sitting for 10-15 minutes EKG revealed sinus bradycardia rate of 49 but no acute changes otherwise. His blood  pressure was improved from his last visit.  Additionally he denies any shortness of breath he denies any jaw pain.   Allergies  Allergen Reactions  . Fentanyl     unknown  . Gabapentin     unknown  . Lisinopril     unknown  . Lyrica (Pregabalin)     unknown    Current Outpatient Prescriptions  Medication Sig Dispense Refill  . amLODipine (NORVASC) 10 MG tablet Take 1 tablet by mouth every evening.      [name] aspirin 81 MG tablet Take 81 mg by mouth daily.        [name] atorvastatin (LIPITOR) 40 MG tablet Take 40 mg by mouth daily.      . citalopram (CELEXA) 20 MG tablet Take 20 mg by mouth daily.      . Fluticasone-Salmeterol (ADVAIR) 100-50 MCG/DOSE AEPB Inhale 1 puff into the lungs every 12 (twelve) hours.      [name] glucose blood test strip 1 each by Other route as needed. Use as instructed       . glucose monitoring kit (FREESTYLE) monitoring kit 1 each by Does not apply route as needed.        [name] HYDROcodone-acetaminophen (NORCO) 10-325 MG per tablet Take 1 tablet by mouth as needed.      . isosorbide mononitrate (IMDUR) 60 MG 24 hr tablet Take 1 tablet (60 mg total) by mouth daily.      [name] losartan-hydrochlorothiazide (HYZAAR) 100-25 MG per tablet Take 1 tablet by  mouth every evening.      . metoprolol succinate (TOPROL XL) 25 MG 24 hr tablet Take 1/2 tablet (12.5mg ) daily.  15 tablet  11  . metroNIDAZOLE (METROCREAM) 0.75 % cream Apply topically 2 (two) times daily.        . nitroGLYCERIN (NITROSTAT) 0.4 MG SL tablet Place 1 tablet (0.4 mg total) under the tongue every 5 (five) minutes as needed for chest pain.  25 tablet  3  . omeprazole (PRILOSEC) 20 MG capsule Take 40 mg by mouth daily.        No current facility-administered medications for this visit.    Past Medical History  Diagnosis Date  . Onychomycosis   . GERD (gastroesophageal reflux disease)   . History of colonoscopy 2004    finding of tics and AMV only no polpys  . DDD (degenerative disc disease) 2008  . Cataract     . Diabetes mellitus   . Rosacea   . Hypertension   . COPD (chronic obstructive pulmonary disease)   . Hyperlipidemia 02/05/2013  . Tobacco abuse 02/05/2013  . Claudication     LEA DUPLEX, 07/07/2008 - Normal  . CAD (coronary artery disease)     stent to RCA 2003 also had 40% lesion at that time; NUCLEAR STRESS TEST, 01/16/2010 - normal  now with cath 80-90% stenosis in LAD, will try medical therapy if no improvement PCI  . Bradycardia, sinus 02/18/2013    Past Surgical History  Procedure Laterality Date  . Colon surgery  2005    renal pouch rectal anastimosis  . Cardiac surgery    . Coronary angioplasty with stent placement  09/2002    2 stents to RCA  . Cardiac catheterization  08/29/2002    2-vessel CAD with high-grade stenoses in RCA; stenting to RCA  . Cardiac catheterization  2014    80-90% lesion will try medical therapy    ZOX:WRUEAVW:UJ colds or fevers, no weight changes Skin:no rashes or ulcers HEENT:no blurred vision, no congestion CV:see HPI PUL:see HPI GI:no diarrhea constipation or melena, no indigestion GU:no hematuria, no dysuria MS:no joint pain, no claudication Neuro:no syncope, no lightheadedness Endo:no diabetes, no thyroid disease  PHYSICAL EXAM BP 118/60  Pulse 49  Ht 5\' 9"  (1.753 m)  Wt 225 lb 14.4 oz (102.468 kg)  BMI 33.34 kg/m2 General:Pleasant affect, NAD Skin:Warm and dry, brisk capillary refill HEENT:normocephalic, sclera clear, mucus membranes moist Neck:supple, no JVD, no bruits  Heart:S1S2 RRR without murmur, gallup, rub or click Lungs:clear without rales, rhonchi, or wheezes WJX:BJYN, non tender, + BS, do not palpate liver spleen or masses Ext:no lower ext edema, 2+ pedal pulses, 2+ radial pulses Neuro:alert and oriented, MAE, follows commands, + facial symmetry  EKG: Sinus bradycardia rate of 49 no acute changes from previous tracings  ASSESSMENT AND PLAN Bradycardia, sinus Symptomatic, has improved now that we have decreased his  Lopressor originally when he came into the room his heart rate was 60 after sitting for 10-15 minutes his EKG revealed sinus bradycardia rate of 48. He has no further symptoms and is actually feeling much better. I am stopping his Lopressor to one half twice a day and placing him on Toprol-XL 12.5 mg daily he'll take it in the morning  CAD (coronary artery disease), hx of 2 stents to RCA in 2003, residual disease at that time of 40% LAD, now 80-90% stenosis of LAD Reviewed the patient's cath report with Dr. Allyson Sabal due to the small size of this vessels if we can  prevent intervention with medications it would be the patient's best plan.  Currently patient has had no more shortness of breath and no jaw pain on the current medical regimen. He'll follow up with Dr. Allyson Sabal in 4-5 weeks.  Tobacco abuse Patient is cutting back on his tobacco and is down to only one third pack a day he was congratulated on this.  Hyperlipidemia Treated and will need followup per PCP  HTN (hypertension) Controlled  DM (diabetes mellitus) Followed by primary care

## 2013-02-24 NOTE — Assessment & Plan Note (Signed)
Treated and will need followup per PCP

## 2013-02-24 NOTE — Assessment & Plan Note (Signed)
Reviewed the patient's cath report with Dr. Allyson Sabal due to the small size of this vessels if we can prevent intervention with medications it would be the patient's best plan.  Currently patient has had no more shortness of breath and no jaw pain on the current medical regimen. He'll follow up with Dr. Allyson Sabal in 4-5 weeks.

## 2013-03-10 DIAGNOSIS — E119 Type 2 diabetes mellitus without complications: Secondary | ICD-10-CM | POA: Diagnosis not present

## 2013-03-10 DIAGNOSIS — J449 Chronic obstructive pulmonary disease, unspecified: Secondary | ICD-10-CM | POA: Diagnosis not present

## 2013-03-10 DIAGNOSIS — I10 Essential (primary) hypertension: Secondary | ICD-10-CM | POA: Diagnosis not present

## 2013-03-11 ENCOUNTER — Telehealth: Payer: Self-pay | Admitting: Cardiovascular Disease

## 2013-03-11 NOTE — Telephone Encounter (Signed)
Returned call.  Pt stated he thinks he has his metoprolol situated.  Stated he was switched from tartrate to succinate and stated he has been taking the right one.  Pt informed he should be taking metoprolol succinate 25mg  tabs 1/2 daily.  Pt verbalized understanding.  Stated he went through all of his records and figured out he was taking the right one.  Stated he took the tartrate out from his regular meds and put a big "X" on it.

## 2013-03-11 NOTE — Telephone Encounter (Signed)
Albert Powell is calling because he has gotten confused on which medications he should be taking. The medications are Metoprolol Tartrate or Metoprolol Succinate and wants to know if he should be taking a half of pill twice a day versus a half pill once a day .[name]   Thanks

## 2013-03-30 ENCOUNTER — Encounter: Payer: Self-pay | Admitting: Cardiovascular Disease

## 2013-03-30 ENCOUNTER — Ambulatory Visit (INDEPENDENT_AMBULATORY_CARE_PROVIDER_SITE_OTHER): Payer: Medicare Other | Admitting: Cardiovascular Disease

## 2013-03-30 VITALS — BP 136/68 | HR 64 | Ht 69.0 in | Wt 228.7 lb

## 2013-03-30 DIAGNOSIS — I2510 Atherosclerotic heart disease of native coronary artery without angina pectoris: Secondary | ICD-10-CM | POA: Diagnosis not present

## 2013-03-30 DIAGNOSIS — R0989 Other specified symptoms and signs involving the circulatory and respiratory systems: Secondary | ICD-10-CM | POA: Diagnosis not present

## 2013-03-30 NOTE — Progress Notes (Signed)
03/30/2013 Albert Powell   [birth year]  [id number]  Primary Physician [name], MD Primary Cardiologist: [name] MD [name]   HPI:  72 y.o. male with known history of CAD status post stenting with 2 stents to RCA in 2003 and residual LAD, hypertension and hyperlipidemia and diabetes mellitus on diet, presented to the ER because of jaw discomfort with chest pressure for 2-3 days. Patient's symptoms usually happen in the night. .  Patient was negative for myocardial infarction he had a LexiScan Myoview 02/07/13 which was negative for ischemia, but due to similarities of his prior coronary disease and the symptoms and multiple risk factors for coronary disease including family history hypertension diabetes known coronary artery disease patient was scheduled for cardiac catheterization with Dr. Allyson Sabal. Revealing 80-90% focal distal LAD disease and a 1.5-1.7 mm vessel left circumflex nondominant with a 90% AV groove stenosis in a small vessel there was a moderate to large obtuse marginal branch with a 60% proximal segmental calcified stenosis. The RCA had widely patent stents. EF was 60%. Due to the small size of the vessels and the negative nuclear stress test that are very filled medical therapy was the patient's best option.  Patient was seen back in followup last week with a heart rate of 48 symptomatic with weakness feeling very tired. He still had some shortness of breath but no jaw pain which is his anginal equivalent. Medications were adjusted because Lopressor actually had him hold it one day and then he was started back half a tablet daily he felt better so he is now resumed half tablet twice a day.  Today's back for followup initially his heart rate was 60 after he walked in the room but after sitting for 10-15 minutes EKG revealed sinus bradycardia rate of 49 but no acute changes otherwise. His blood pressure was improved from his last visit.  Additionally he  denies any shortness of breath he denies any jaw pain. Since he was seen by [name] 02/24/13 and his beta blockers were adjusted he no longer is bradycardic or symptomatic. He denies chest pain or shortness of breath. I do hear a carotid bruit on exam and cardiac echo Doppler studies on him.    Current Outpatient Prescriptions  Medication Sig Dispense Refill  . amLODipine (NORVASC) 10 MG tablet Take 1 tablet by mouth every evening.      [name] aspirin 81 MG tablet Take 81 mg by mouth daily.        [name] atorvastatin (LIPITOR) 40 MG tablet Take 40 mg by mouth daily.      . citalopram (CELEXA) 20 MG tablet Take 20 mg by mouth daily.      . Fluticasone-Salmeterol (ADVAIR) 100-50 MCG/DOSE AEPB Inhale 1 puff into the lungs every 12 (twelve) hours.      [name] glucose blood test strip 1 each by Other route as needed. Use as instructed       . glucose monitoring kit (FREESTYLE) monitoring kit 1 each by Does not apply route as needed.        [name] HYDROcodone-acetaminophen (NORCO) 10-325 MG per tablet Take 1 tablet by mouth as needed.      . isosorbide mononitrate (IMDUR) 60 MG 24 hr tablet Take 1 tablet (60 mg total) by mouth daily.      [name] losartan-hydrochlorothiazide (HYZAAR) 100-25 MG per tablet Take 1 tablet by mouth every evening.      . metoprolol succinate (TOPROL XL) 25 MG 24 hr tablet  Take 1/2 tablet (12.5mg ) daily.  15 tablet  11  . metroNIDAZOLE (METROCREAM) 0.75 % cream Apply topically 2 (two) times daily.        . nitroGLYCERIN (NITROSTAT) 0.4 MG SL tablet Place 1 tablet (0.4 mg total) under the tongue every 5 (five) minutes as needed for chest pain.  25 tablet  3  . omeprazole (PRILOSEC) 20 MG capsule Take 40 mg by mouth daily.        No current facility-administered medications for this visit.    Allergies  Allergen Reactions  . Fentanyl     unknown  . Gabapentin     unknown  . Lisinopril     unknown  . Lyrica (Pregabalin)     unknown    History   Social History  . Marital Status:  Married    Spouse Name: N/A    Number of Children: N/A  . Years of Education: N/A   Occupational History  . Not on file.   Social History Main Topics  . Smoking status: Current Every Day Smoker -- 0.75 packs/day for 57 years    Types: Cigarettes  . Smokeless tobacco: Former Neurosurgeon    Quit date: 10/04/2010  . Alcohol Use: No  . Drug Use: No  . Sexually Active: Not on file   Other Topics Concern  . Not on file   Social History Narrative  . No narrative on file     Review of Systems: General: negative for chills, fever, night sweats or weight changes.  Cardiovascular: negative for chest pain, dyspnea on exertion, edema, orthopnea, palpitations, paroxysmal nocturnal dyspnea or shortness of breath Dermatological: negative for rash Respiratory: negative for cough or wheezing Urologic: negative for hematuria Abdominal: negative for nausea, vomiting, diarrhea, bright red blood per rectum, melena, or hematemesis Neurologic: negative for visual changes, syncope, or dizziness All other systems reviewed and are otherwise negative except as noted above.    Blood pressure 136/68, pulse 64, height 5\' 9"  (1.753 m), weight 228 lb 11.2 oz (103.738 kg).  General appearance: alert and no distress Neck: no adenopathy, no JVD, supple, symmetrical, trachea midline, thyroid not enlarged, symmetric, no tenderness/mass/nodules and oft left carotid bruit Lungs: clear to auscultation bilaterally Heart: regular rate and rhythm, S1, S2 normal, no murmur, click, rub or gallop Extremities: extremities normal, atraumatic, no cyanosis or edema  EKG not performed today  ASSESSMENT AND PLAN:   Bradycardia, sinus Resolved since adjusting beta blocker dose  CAD (coronary artery disease), hx of 2 stents to RCA in 2003, residual disease at that time of 40% LAD, now 80-90% stenosis of LAD Status post remote stenting at [facility] in 2003 with 2 stents to the RCA. I kept him 02/08/13 revealing a widely  patent stents and apical LAD disease in the vessel too small to intervene on. He had normal LV function and moderate circumflex obtuse marginal branch disease. He said no further chest pain.      [name] MD FACP,FACC,FAHA, [facility] 03/30/2013 11:46 AM

## 2013-03-30 NOTE — Patient Instructions (Addendum)
   We will see you back in follow up in 6months with [name] NP and 12 months with Dr Allyson Sabal  Dr Allyson Sabal has ordered carotid dopplers: Your physician has requested that you have a carotid duplex. This test is an ultrasound of the carotid arteries in your neck. It looks at blood flow through these arteries that supply the brain with blood. Allow one hour for this exam. There are no restrictions or special instructions.

## 2013-03-30 NOTE — Assessment & Plan Note (Signed)
Resolved since adjusting beta blocker dose

## 2013-03-30 NOTE — Assessment & Plan Note (Signed)
Status post remote stenting at [facility] in 2003 with 2 stents to the RCA. I kept him 02/08/13 revealing a widely patent stents and apical LAD disease in the vessel too small to intervene on. He had normal LV function and moderate circumflex obtuse marginal branch disease. He said no further chest pain.

## 2013-04-11 ENCOUNTER — Ambulatory Visit (HOSPITAL_COMMUNITY)
Admission: RE | Admit: 2013-04-11 | Discharge: 2013-04-11 | Disposition: A | Discharge status: Home / Self Care | Payer: Medicare Other | Source: Ambulatory Visit | Attending: Cardiovascular Disease | Admitting: Cardiovascular Disease

## 2013-04-11 DIAGNOSIS — R0989 Other specified symptoms and signs involving the circulatory and respiratory systems: Secondary | ICD-10-CM | POA: Diagnosis not present

## 2013-04-11 NOTE — Progress Notes (Signed)
Carotid Duplex Completed. Preliminary results by tech.. Right ICA <50% stenosis, Left ICA >80% stenosis . Marilynne Halsted, RDMS, RVT

## 2013-04-14 ENCOUNTER — Ambulatory Visit (INDEPENDENT_AMBULATORY_CARE_PROVIDER_SITE_OTHER): Payer: Medicare Other | Admitting: Cardiovascular Disease

## 2013-04-14 ENCOUNTER — Encounter: Payer: Self-pay | Admitting: Cardiovascular Disease

## 2013-04-14 VITALS — BP 130/78 | HR 68 | Ht 69.0 in | Wt 229.4 lb

## 2013-04-14 DIAGNOSIS — Z79899 Other long term (current) drug therapy: Secondary | ICD-10-CM | POA: Diagnosis not present

## 2013-04-14 DIAGNOSIS — D689 Coagulation defect, unspecified: Secondary | ICD-10-CM

## 2013-04-14 DIAGNOSIS — Z01818 Encounter for other preprocedural examination: Secondary | ICD-10-CM

## 2013-04-14 DIAGNOSIS — R5381 Other malaise: Secondary | ICD-10-CM | POA: Diagnosis not present

## 2013-04-14 DIAGNOSIS — I779 Disorder of arteries and arterioles, unspecified: Secondary | ICD-10-CM | POA: Insufficient documentation

## 2013-04-14 DIAGNOSIS — R5383 Other fatigue: Secondary | ICD-10-CM

## 2013-04-14 NOTE — Patient Instructions (Addendum)
Dr. Allyson Sabal has ordered a peripheral angiogram (carotid angiogram)  to be done at [facility]  This procedure is going to look at the bloodflow in your lower extremities.  If Dr. Allyson Sabal is able to open up the arteries, you will have to spend one night in the hospital.  If he is not able to open the arteries, you will be able to go home that same day.    After the procedure, you will not be allowed to drive for 3 days or push, pull, or lift anything greater than 10 lbs for one week.    You will be required to have bloodwork prior to your procedure.  Our scheduler will advise you on when these items need to be done.

## 2013-04-14 NOTE — Progress Notes (Signed)
04/14/2013 Albert Powell   1941-01-29  [id number]  Primary Physician Albert Bos, MD Primary Cardiologist: Albert Gess MD Albert Powell   HPI:  72 y.o. male with known history of CAD status post stenting with 2 stents to RCA in 2003 and residual LAD, hypertension and hyperlipidemia and diabetes mellitus on diet, presented to the ER because of jaw discomfort with chest pressure for 2-3 days. Patient's symptoms usually happen in the night. .  Patient was negative for myocardial infarction he had a LexiScan Myoview 02/07/13 which was negative for ischemia, but due to similarities of his prior coronary disease and the symptoms and multiple risk factors for coronary disease including family history hypertension diabetes known coronary artery disease patient was scheduled for cardiac catheterization with Dr. Allyson Powell. Revealing 80-90% focal distal LAD disease and a 1.5-1.7 mm vessel left circumflex nondominant with a 90% AV groove stenosis in a small vessel there was a moderate to large obtuse marginal branch with a 60% proximal segmental calcified stenosis. The RCA had widely patent stents. EF was 60%. Due to the small size of the vessels and the negative nuclear stress test that are very filled medical therapy was the patient's best option.  Patient was seen back in followup last week with a heart rate of 48 symptomatic with weakness feeling very tired. He still had some shortness of breath but no jaw pain which is his anginal equivalent. Medications were adjusted because Lopressor actually had him hold it one day and then he was started back half a tablet daily he felt better so he is now resumed half tablet twice a day.  Today's back for followup initially his heart rate was 60 after he walked in the room but after sitting for 10-15 minutes EKG revealed sinus bradycardia rate of 49 but no acute changes otherwise. His blood pressure was improved from his last visit.  Additionally he  denies any shortness of breath he denies any jaw pain.  Since he was seen by Albert Powell 02/24/13 and his beta blockers were adjusted he no longer is bradycardic or symptomatic. He denies chest pain or shortness of breath. I do hear a carotid bruit on exam and cardiac echo Doppler studies on him. I performed carotid Dopplers on him 04/11/13 which revealed a moderately high-grade left ICA stenosis. He is neurologically asymptomatic. Based on the intermediate nature of his Doppler study is unclear to me whether he has a lesion that can be followed conservatively but if his ultrasound or requires surgical revascularization. Because of this I decided to proceed with carotid angiography to further define the degree of stenosis.    Current Outpatient Prescriptions  Medication Sig Dispense Refill  . amLODipine (NORVASC) 10 MG tablet Take 1 tablet by mouth every evening.      [name] aspirin 81 MG tablet Take 81 mg by mouth daily.        [name] atorvastatin (LIPITOR) 40 MG tablet Take 40 mg by mouth daily.      . citalopram (CELEXA) 20 MG tablet Take 20 mg by mouth daily.      . Fluticasone-Salmeterol (ADVAIR) 100-50 MCG/DOSE AEPB Inhale 1 puff into the lungs every 12 (twelve) hours.      [name] glucose blood test strip 1 each by Other route as needed. Use as instructed       . glucose monitoring kit (FREESTYLE) monitoring kit 1 each by Does not apply route as needed.        [name] HYDROcodone-acetaminophen (NORCO)  10-325 MG per tablet Take 1 tablet by mouth as needed.      . isosorbide mononitrate (IMDUR) 60 MG 24 hr tablet Take 1 tablet (60 mg total) by mouth daily.      [name] losartan-hydrochlorothiazide (HYZAAR) 100-25 MG per tablet Take 1 tablet by mouth every evening.      . metoprolol succinate (TOPROL XL) 25 MG 24 hr tablet Take 1/2 tablet (12.5mg ) daily.  15 tablet  11  . metroNIDAZOLE (METROCREAM) 0.75 % cream Apply topically 2 (two) times daily.        . nitroGLYCERIN (NITROSTAT) 0.4 MG SL tablet Place 1 tablet (0.4 mg  total) under the tongue every 5 (five) minutes as needed for chest pain.  25 tablet  3  . omeprazole (PRILOSEC) 20 MG capsule Take 40 mg by mouth daily.        No current facility-administered medications for this visit.    Allergies  Allergen Reactions  . Fentanyl     unknown  . Gabapentin     unknown  . Lisinopril     unknown  . Lyrica (Pregabalin)     unknown    History   Social History  . Marital Status: Married    Spouse Name: N/A    Number of Children: N/A  . Years of Education: N/A   Occupational History  . Not on file.   Social History Main Topics  . Smoking status: Current Every Day Smoker -- 0.75 packs/day for 57 years    Types: Cigarettes  . Smokeless tobacco: Former Neurosurgeon    Quit date: 10/04/2010  . Alcohol Use: No  . Drug Use: No  . Sexually Active: Not on file   Other Topics Concern  . Not on file   Social History Narrative  . No narrative on file     Review of Systems: General: negative for chills, fever, night sweats or weight changes.  Cardiovascular: negative for chest pain, dyspnea on exertion, edema, orthopnea, palpitations, paroxysmal nocturnal dyspnea or shortness of breath Dermatological: negative for rash Respiratory: negative for cough or wheezing Urologic: negative for hematuria Abdominal: negative for nausea, vomiting, diarrhea, bright red blood per rectum, melena, or hematemesis Neurologic: negative for visual changes, syncope, or dizziness All other systems reviewed and are otherwise negative except as noted above.    Blood pressure 130/78, pulse 68, height 5\' 9"  (1.753 m), weight 229 lb 6.4 oz (104.055 kg).  General appearance: alert and no distress Neck: no adenopathy, no JVD, supple, symmetrical, trachea midline, thyroid not enlarged, symmetric, no tenderness/mass/nodules and high-pitched left carotid bruit Lungs: clear to auscultation bilaterally Heart: regular rate and rhythm, S1, S2 normal, no murmur, click, rub or  gallop Extremities: extremities normal, atraumatic, no cyanosis or edema  EKG not performed today  ASSESSMENT AND PLAN:   Carotid artery disease Albert Powell had carotid Dopplers performed in the office 04/11/13 revealing moderately high-grade left ICA stenosis. He is neurologically asymptomatic on aspirin. I'm going to perform coronary angiography on him to determine whether or not his stenosis is tight enough to require surgical reauthorization. He is low risk for carotid endarterectomy thus making prior stenting not applicable. The patient understands the risks and benefits.      Albert Gess MD FACP,FACC,FAHA, [facility] 04/14/2013 10:06 AM

## 2013-04-14 NOTE — Assessment & Plan Note (Signed)
Albert Powell had carotid Dopplers performed in the office 04/11/13 revealing moderately high-grade left ICA stenosis. He is neurologically asymptomatic on aspirin. I'm going to perform coronary angiography on him to determine whether or not his stenosis is tight enough to require surgical reauthorization. He is low risk for carotid endarterectomy thus making prior stenting not applicable. The patient understands the risks and benefits.

## 2013-04-18 ENCOUNTER — Encounter (HOSPITAL_COMMUNITY): Payer: Self-pay

## 2013-04-19 DIAGNOSIS — D689 Coagulation defect, unspecified: Secondary | ICD-10-CM | POA: Diagnosis not present

## 2013-04-19 DIAGNOSIS — R5381 Other malaise: Secondary | ICD-10-CM | POA: Diagnosis not present

## 2013-04-19 DIAGNOSIS — Z79899 Other long term (current) drug therapy: Secondary | ICD-10-CM | POA: Diagnosis not present

## 2013-04-19 DIAGNOSIS — R5383 Other fatigue: Secondary | ICD-10-CM | POA: Diagnosis not present

## 2013-04-19 LAB — APTT: aPTT: 33 seconds (ref 24–37)

## 2013-04-19 LAB — CBC
MCH: 31 pg (ref 26.0–34.0)
MCHC: 34.5 g/dL (ref 30.0–36.0)
Platelets: 253 10*3/uL (ref 150–400)
RBC: 4.68 MIL/uL (ref 4.22–5.81)
RDW: 14.2 % (ref 11.5–15.5)

## 2013-04-20 ENCOUNTER — Other Ambulatory Visit: Payer: Self-pay

## 2013-04-20 LAB — BASIC METABOLIC PANEL
BUN: 17 mg/dL (ref 6–23)
CO2: 28 mEq/L (ref 19–32)
Chloride: 101 mEq/L (ref 96–112)
Creat: 1.01 mg/dL (ref 0.50–1.35)

## 2013-04-21 ENCOUNTER — Encounter: Payer: Self-pay | Admitting: *Deleted

## 2013-04-25 ENCOUNTER — Encounter (HOSPITAL_COMMUNITY)
Admission: RE | Disposition: A | Discharge status: Home / Self Care | Payer: Self-pay | Source: Ambulatory Visit | Attending: Cardiovascular Disease

## 2013-04-25 ENCOUNTER — Ambulatory Visit (HOSPITAL_COMMUNITY)
Admission: RE | Admit: 2013-04-25 | Discharge: 2013-04-25 | Disposition: A | Discharge status: Home / Self Care | Payer: Medicare Other | Source: Ambulatory Visit | Attending: Cardiovascular Disease | Admitting: Cardiovascular Disease

## 2013-04-25 ENCOUNTER — Other Ambulatory Visit: Payer: Self-pay

## 2013-04-25 ENCOUNTER — Other Ambulatory Visit: Payer: Self-pay | Admitting: Cardiology

## 2013-04-25 DIAGNOSIS — I498 Other specified cardiac arrhythmias: Secondary | ICD-10-CM | POA: Insufficient documentation

## 2013-04-25 DIAGNOSIS — Z7982 Long term (current) use of aspirin: Secondary | ICD-10-CM | POA: Insufficient documentation

## 2013-04-25 DIAGNOSIS — I739 Peripheral vascular disease, unspecified: Secondary | ICD-10-CM

## 2013-04-25 DIAGNOSIS — E785 Hyperlipidemia, unspecified: Secondary | ICD-10-CM | POA: Diagnosis not present

## 2013-04-25 DIAGNOSIS — Z888 Allergy status to other drugs, medicaments and biological substances status: Secondary | ICD-10-CM | POA: Insufficient documentation

## 2013-04-25 DIAGNOSIS — I70219 Atherosclerosis of native arteries of extremities with intermittent claudication, unspecified extremity: Secondary | ICD-10-CM | POA: Insufficient documentation

## 2013-04-25 DIAGNOSIS — F17200 Nicotine dependence, unspecified, uncomplicated: Secondary | ICD-10-CM | POA: Insufficient documentation

## 2013-04-25 DIAGNOSIS — E119 Type 2 diabetes mellitus without complications: Secondary | ICD-10-CM | POA: Diagnosis not present

## 2013-04-25 DIAGNOSIS — Z8249 Family history of ischemic heart disease and other diseases of the circulatory system: Secondary | ICD-10-CM | POA: Diagnosis not present

## 2013-04-25 DIAGNOSIS — Z9861 Coronary angioplasty status: Secondary | ICD-10-CM | POA: Insufficient documentation

## 2013-04-25 DIAGNOSIS — I2510 Atherosclerotic heart disease of native coronary artery without angina pectoris: Secondary | ICD-10-CM | POA: Insufficient documentation

## 2013-04-25 DIAGNOSIS — I10 Essential (primary) hypertension: Secondary | ICD-10-CM | POA: Insufficient documentation

## 2013-04-25 DIAGNOSIS — Z79899 Other long term (current) drug therapy: Secondary | ICD-10-CM | POA: Diagnosis not present

## 2013-04-25 DIAGNOSIS — IMO0002 Reserved for concepts with insufficient information to code with codable children: Secondary | ICD-10-CM | POA: Diagnosis not present

## 2013-04-25 DIAGNOSIS — Z01818 Encounter for other preprocedural examination: Secondary | ICD-10-CM

## 2013-04-25 DIAGNOSIS — I6529 Occlusion and stenosis of unspecified carotid artery: Secondary | ICD-10-CM | POA: Insufficient documentation

## 2013-04-25 HISTORY — PX: CAROTID ANGIOGRAM: SHX5504

## 2013-04-25 LAB — GLUCOSE, CAPILLARY
Glucose-Capillary: 128 mg/dL — ABNORMAL HIGH (ref 70–99)
Glucose-Capillary: 102 mg/dL — ABNORMAL HIGH (ref 70–99)

## 2013-04-25 SURGERY — CAROTID ANGIOGRAM
Anesthesia: LOCAL

## 2013-04-25 MED ORDER — LIDOCAINE HCL (PF) 1 % IJ SOLN
INTRAMUSCULAR | Status: AC
  Filled 2013-04-25: qty 30

## 2013-04-25 MED ORDER — SODIUM CHLORIDE 0.9 % IJ SOLN: 3.0000 mL | INTRAMUSCULAR | Status: DC | PRN

## 2013-04-25 MED ORDER — SODIUM CHLORIDE 0.45 % IV SOLN
INTRAVENOUS | Status: DC
  Administered 2013-04-25: 14:00:00 via INTRAVENOUS

## 2013-04-25 MED ORDER — ASPIRIN 81 MG PO CHEW
324.0000 mg | CHEWABLE_TABLET | ORAL | Status: AC
  Administered 2013-04-25: 324 mg via ORAL
  Filled 2013-04-25: qty 4

## 2013-04-25 MED ORDER — ACETAMINOPHEN 325 MG PO TABS: 650.0000 mg | ORAL_TABLET | ORAL | Status: DC | PRN

## 2013-04-25 MED ORDER — SODIUM CHLORIDE 0.9 % IV SOLN
INTRAVENOUS | Status: DC
  Administered 2013-04-25: 11:00:00 via INTRAVENOUS

## 2013-04-25 MED ORDER — ONDANSETRON HCL 4 MG/2ML IJ SOLN: 4.0000 mg | Freq: Four times a day (QID) | INTRAMUSCULAR | Status: DC | PRN

## 2013-04-25 NOTE — H&P (Signed)
    Pt was reexamined and existing H & P reviewed. No changes found.  [name], MD [facility] 04/25/2013 12:42 PM

## 2013-04-25 NOTE — CV Procedure (Signed)
Albert Powell is a 72 y.o. male    [id number] LOCATION:  FACILITY: [facility]  PHYSICIAN: Nanetta Batty, M.D. 11-Aug-1941   DATE OF PROCEDURE:  04/25/2013  DATE OF DISCHARGE:   CARDIAC CATHETERIZATION     History obtained from chart review.72 y.o. male with known history of CAD status post stenting with 2 stents to RCA in 2003 and residual LAD, hypertension and hyperlipidemia and diabetes mellitus on diet, presented to the ER because of jaw discomfort with chest pressure for 2-3 days. Patient's symptoms usually happen in the night. .  Patient was negative for myocardial infarction he had a LexiScan Myoview 02/07/13 which was negative for ischemia, but due to similarities of his prior coronary disease and the symptoms and multiple risk factors for coronary disease including family history hypertension diabetes known coronary artery disease patient was scheduled for cardiac catheterization with Dr. Allyson Sabal. Revealing 80-90% focal distal LAD disease and a 1.5-1.7 mm vessel left circumflex nondominant with a 90% AV groove stenosis in a small vessel there was a moderate to large obtuse marginal branch with a 60% proximal segmental calcified stenosis. The RCA had widely patent stents. EF was 60%. Due to the small size of the vessels and the negative nuclear stress test that are very filled medical therapy was the patient's best option.  Patient was seen back in followup last week with a heart rate of 48 symptomatic with weakness feeling very tired. He still had some shortness of breath but no jaw pain which is his anginal equivalent. Medications were adjusted because Lopressor actually had him hold it one day and then he was started back half a tablet daily he felt better so he is now resumed half tablet twice a day.  Today's back for followup initially his heart rate was 60 after he walked in the room but after sitting for 10-15 minutes EKG revealed sinus bradycardia rate of 49 but no acute changes otherwise.  His blood pressure was improved from his last visit.  Additionally he denies any shortness of breath he denies any jaw pain.  Since he was seen by [name] 02/24/13 and his beta blockers were adjusted he no longer is bradycardic or symptomatic. He denies chest pain or shortness of breath. I do hear a carotid bruit on exam and cardiac echo Doppler studies on him.  I performed carotid Dopplers on him 04/11/13 which revealed a moderately high-grade left ICA stenosis. He is neurologically asymptomatic. Based on the intermediate nature of his Doppler study is unclear to me whether he has a lesion that can be followed conservatively by  ultrasound or requires surgical revascularization. Because of this I decided to proceed with carotid angiography to further define the degree of stenosis.    PROCEDURE DESCRIPTION:    The patient was brought to the second floor [facility] cath lab in the postabsorptive state. He was not Premedicated.[name] His right groinwas prepped and shaved in usual sterile fashion. Xylocaine 1% was used  for local anesthesia. A 5 French sheath was inserted into the right common femoral artery using standard Seldinger technique. A 5 French pigtail catheter, JB 1 catheter and pigtail catheters were used for arch angiography, distal abdominal aortic bruit and bilateral iliac angiography as well as pullback gradient across the right common and external iliac arteries. Visipaque allergies for the entirety of the case. Retrograde aortic pressure was monitored during the case.   HEMODYNAMICS:    AO SYSTOLIC/AO DIASTOLIC: 168/72   Pullback gradient across the right  common and external iliac artery: 100 mm mercury after administration of 200 mcg of intra-arterial nitroglycerin via the SideArm sheath  ANGIOGRAPHIC RESULTS:   1: Aortic arch angiogram-bovine arch.  2: Right carotid-minimal irregularities in the right internal carotid artery. The right carotid for both anterior cerebral  arteries.  3: Left carotid-95% proximal left ICA stenosis.  4: Abdominal aortogram-the distal abdominal aorta was widely patent.the right common and external iliac arteries were fluoroscopic calcified and severely diseased. There was 80-90% segmental calcified stenosis in the distal right common and proximal right external iliac arteries.   IMPRESSION:[name] has a high-grade left ICA stenosis. He is neurologically asymptomatic. This will need to be revascularized.. We will entertain carotid stenting and get preauthorization from his secondary Medicare carrier. He does have right lower extremity claudication  And pain for which he is wearing a TENS unit. I wonder whether his pain is vascular in nature as opposed to neurologic. We'll get arterial Dopplers in our office and I will see him back in followup to discuss his therapeutic options.  The sheath was removed and pressure was held on the groin to achieve hemostasis. The patient left the lab in stable condition. He will be hydrated, remainder, for 4 hours and then will be discharged home.  [name] MD, [facility] 04/25/2013 1:51 PM

## 2013-05-02 ENCOUNTER — Telehealth: Payer: Self-pay | Admitting: Cardiovascular Disease

## 2013-05-02 NOTE — Telephone Encounter (Signed)
Pt wife called and was concerned about not hearing back about Mr. Weant surgery, pt's wife stated that Dr. Allyson Sabal was going to do surgery and they would hear back from him or someone within 1-2 weeks. She would like a call back about this today.

## 2013-05-03 NOTE — Telephone Encounter (Signed)
Pt said he was told that he needed surgery in 2wks-Said he need it  On his Cartoid artery.This should be the week the surgery should be-have not heard anything about this surgery this week.Please call today-keep getting calls-but not about this surgery.They want to talk to his nurse or his PA.Please call asap.

## 2013-05-03 NOTE — Telephone Encounter (Signed)
Pt called back.  Stated Dr. Allyson Sabal told him and his wife that he needed to have his carotid artery taken care of within the next 2 weeks b/c it's 90% blocked.  Stated he hasn't heard anything and it has been a week.  RN reviewed notes.  In note from angiogram, Dr. Allyson Sabal stated he needed to get pre-authorization w/ pt's secondary Medicare carrier for possible carotid stenting and also that pt needed arterial dopplers for lower extremity claudication.  Stated he would see pt in f/u.  Pt informed of this and that Dr. Allyson Sabal will be notified r/t having Carotid procedure done prior to having dopplers and f/u appt on 9.3.14.  Pt verbalized understanding and agreed w/ plan.  Message forwarded to Dr. Allyson Sabal for review and further instructions

## 2013-05-03 NOTE — Telephone Encounter (Signed)
Returned call.  Left message to call back before 4pm.  

## 2013-05-03 NOTE — Telephone Encounter (Signed)
Message forwarded to K. Petra Kuba, RN to get in contact w/ Dr. Allyson Sabal as RN has already talked w/ pt and informed Dr. Allyson Sabal would be notified.

## 2013-05-04 NOTE — Telephone Encounter (Signed)
I spoke with Mr. Brawley about his carotid artery disease.HEENT first to have carotid artery stenting as a postal endarterectomy. I will attempt to call his secondary carrier, [employer], on Friday to attempt to get preauthorization for this. He is at low risk asymptomatic.

## 2013-05-04 NOTE — Telephone Encounter (Signed)
Dr Allyson Sabal spoke with patient

## 2013-05-04 NOTE — Telephone Encounter (Signed)
advise

## 2013-05-04 NOTE — Telephone Encounter (Signed)
Returning your call. °

## 2013-05-04 NOTE — Telephone Encounter (Signed)
Wife called back.  Stated it has been almost two weeks and pt has not been scheduled for the carotid artery.  Wife informed RN called and spoke w/ pt yesterday and informed Dr. Allyson Sabal will be notified for further instructions.  Informed response has not been given and Dr. Allyson Sabal is in the office today.  Informed RN will leave a message for Dr. Allyson Sabal to review chart today.  RN apologized for the delay.  Verbalized understanding.

## 2013-05-10 ENCOUNTER — Ambulatory Visit (HOSPITAL_COMMUNITY)
Admission: RE | Admit: 2013-05-10 | Discharge: 2013-05-10 | Disposition: A | Discharge status: Home / Self Care | Payer: Medicare Other | Source: Ambulatory Visit | Attending: Cardiology | Admitting: Cardiology

## 2013-05-10 ENCOUNTER — Other Ambulatory Visit: Payer: Self-pay | Admitting: *Deleted

## 2013-05-10 DIAGNOSIS — I739 Peripheral vascular disease, unspecified: Secondary | ICD-10-CM

## 2013-05-10 DIAGNOSIS — I70219 Atherosclerosis of native arteries of extremities with intermittent claudication, unspecified extremity: Secondary | ICD-10-CM

## 2013-05-10 DIAGNOSIS — I6529 Occlusion and stenosis of unspecified carotid artery: Secondary | ICD-10-CM

## 2013-05-10 NOTE — Progress Notes (Signed)
Arterial Duplex Lower Ext. Completed. Preliminary by tech .[name] Right EIA 70-99% stenosis was noted. Marilynne Halsted, BS, RDMS, RVT

## 2013-05-18 ENCOUNTER — Ambulatory Visit (INDEPENDENT_AMBULATORY_CARE_PROVIDER_SITE_OTHER): Payer: Medicare Other | Admitting: Cardiovascular Disease

## 2013-05-18 ENCOUNTER — Encounter: Payer: Self-pay | Admitting: Cardiovascular Disease

## 2013-05-18 VITALS — BP 130/76 | HR 76 | Ht 69.0 in | Wt 229.0 lb

## 2013-05-18 DIAGNOSIS — I739 Peripheral vascular disease, unspecified: Secondary | ICD-10-CM

## 2013-05-18 DIAGNOSIS — I779 Disorder of arteries and arterioles, unspecified: Secondary | ICD-10-CM

## 2013-05-18 NOTE — Assessment & Plan Note (Signed)
The patient underwent angiography on 8/11+14 revealing a 95% proximal left internal carotid artery stenosis. He has a bovine arch. He is neurologically a symptomatic. I have arranged for him to see Dr. Durene Cal for consideration of elective left carotid endarterectomy.

## 2013-05-18 NOTE — Progress Notes (Signed)
05/18/2013 Albert Powell   07/16/1941  [id number]  Primary Physician [name], MD Primary Cardiologist: [name] MD [name]   HPI:  72 y.o. male with known history of CAD status post stenting with 2 stents to RCA in 2003 and residual LAD, hypertension and hyperlipidemia and diabetes mellitus on diet, presented to the ER because of jaw discomfort with chest pressure for 2-3 days. Patient's symptoms usually happen in the night. .  Patient was negative for myocardial infarction he had a LexiScan Myoview 02/07/13 which was negative for ischemia, but due to similarities of his prior coronary disease and the symptoms and multiple risk factors for coronary disease including family history hypertension diabetes known coronary artery disease patient was scheduled for cardiac catheterization with Dr. Allyson Sabal. Revealing 80-90% focal distal LAD disease and a 1.5-1.7 mm vessel left circumflex nondominant with a 90% AV groove stenosis in a small vessel there was a moderate to large obtuse marginal branch with a 60% proximal segmental calcified stenosis. The RCA had widely patent stents. EF was 60%. Due to the small size of the vessels and the negative nuclear stress test that are very filled medical therapy was the patient's best option.  Patient was seen back in followup last week with a heart rate of 48 symptomatic with weakness feeling very tired. He still had some shortness of breath but no jaw pain which is his anginal equivalent. Medications were adjusted because Lopressor actually had him hold it one day and then he was started back half a tablet daily he felt better so he is now resumed half tablet twice a day.  Today's back for followup initially his heart rate was 60 after he walked in the room but after sitting for 10-15 minutes EKG revealed sinus bradycardia rate of 49 but no acute changes otherwise. His blood pressure was improved from his last visit.  Additionally he  denies any shortness of breath he denies any jaw pain.  Since he was seen by [name] 02/24/13 and his beta blockers were adjusted he no longer is bradycardic or symptomatic. He denies chest pain or shortness of breath. I do hear a carotid bruit on exam and cardiac echo Doppler studies on him.  I performed carotid Dopplers on him 04/11/13 which revealed a moderately high-grade left ICA stenosis. He is neurologically asymptomatic. Based on the intermediate nature of his Doppler study is unclear to me whether he has a lesion that can be followed conservatively by ultrasound or requires surgical revascularization. Because of this I decided to proceed with carotid angiography to further define the degree of stenosis on 04/25/13 revealing a 95% proximal left internal carotid artery stenosis. I am referring him to Dr. Durene Cal for consideration of elective left carotid endarterectomy. I also documented 90% calcified right external iliac artery stenosis. The circumflex has a claudication and had a duplex ultrasound in our office performed on 05/10/13 revealing a right ABI 0.91 with a high-frequency signal in his right external iliac artery. This was probably will be amenable to percutaneous revascularization.    Current Outpatient Prescriptions  Medication Sig Dispense Refill  . amLODipine (NORVASC) 10 MG tablet Take 1 tablet by mouth every evening.      [name] aspirin 81 MG tablet Take 81 mg by mouth daily.        [name] atorvastatin (LIPITOR) 40 MG tablet Take 40 mg by mouth daily.      . citalopram (CELEXA) 20 MG tablet Take 20 mg by  mouth daily.      . Fluticasone-Salmeterol (ADVAIR) 100-50 MCG/DOSE AEPB Inhale 1 puff into the lungs every 12 (twelve) hours.      [name] glucose blood test strip 1 each by Other route as needed. Use as instructed       . glucose monitoring kit (FREESTYLE) monitoring kit 1 each by Does not apply route as needed.        [name] HYDROcodone-acetaminophen (NORCO) 10-325 MG per tablet Take 1  tablet by mouth as needed.      . isosorbide mononitrate (IMDUR) 60 MG 24 hr tablet Take 1 tablet (60 mg total) by mouth daily.      [name] losartan-hydrochlorothiazide (HYZAAR) 100-25 MG per tablet Take 1 tablet by mouth every evening.      . metoprolol succinate (TOPROL XL) 25 MG 24 hr tablet Take 1/2 tablet (12.5mg ) daily.  15 tablet  11  . metroNIDAZOLE (METROCREAM) 0.75 % cream Apply topically 2 (two) times daily.        . nitroGLYCERIN (NITROSTAT) 0.4 MG SL tablet Place 1 tablet (0.4 mg total) under the tongue every 5 (five) minutes as needed for chest pain.  25 tablet  3  . omeprazole (PRILOSEC) 20 MG capsule Take 20 mg by mouth daily.        No current facility-administered medications for this visit.    Allergies  Allergen Reactions  . Fentanyl     unknown  . Gabapentin     unknown  . Lisinopril     unknown  . Lyrica [Pregabalin]     unknown    History   Social History  . Marital Status: Married    Spouse Name: N/A    Number of Children: N/A  . Years of Education: N/A   Occupational History  . Not on file.   Social History Main Topics  . Smoking status: Current Every Day Smoker -- 0.75 packs/day for 57 years    Types: Cigarettes  . Smokeless tobacco: Former Neurosurgeon    Quit date: 10/04/2010  . Alcohol Use: No  . Drug Use: No  . Sexual Activity: Not on file   Other Topics Concern  . Not on file   Social History Narrative  . No narrative on file     Review of Systems: General: negative for chills, fever, night sweats or weight changes.  Cardiovascular: negative for chest pain, dyspnea on exertion, edema, orthopnea, palpitations, paroxysmal nocturnal dyspnea or shortness of breath Dermatological: negative for rash Respiratory: negative for cough or wheezing Urologic: negative for hematuria Abdominal: negative for nausea, vomiting, diarrhea, bright red blood per rectum, melena, or hematemesis Neurologic: negative for visual changes, syncope, or dizziness All  other systems reviewed and are otherwise negative except as noted above.    Blood pressure 130/76, pulse 76, height 5\' 9"  (1.753 m), weight 229 lb (103.874 kg).  General appearance: alert and no distress Neck: no adenopathy, no JVD, supple, symmetrical, trachea midline, thyroid not enlarged, symmetric, no tenderness/mass/nodules and soft high-pitched left carotid bruit Lungs: clear to auscultation bilaterally Heart: regular rate and rhythm, S1, S2 normal, no murmur, click, rub or gallop Extremities: extremities normal, atraumatic, no cyanosis or edema and his right common femoral arterial puncture site is well-healed  EKG not performed today  ASSESSMENT AND PLAN:   Carotid artery disease The patient underwent angiography on 8/11+14 revealing a 95% proximal left internal carotid artery stenosis. He has a bovine arch. He is neurologically a symptomatic. I have arranged for him to  see Dr. Durene Cal for consideration of elective left carotid endarterectomy.  Peripheral arterial disease At the time of angiography on 04/25/13 I did note a 90% calcified right external iliac artery stenosis with 100 mmHg left gradient after administration of intra-arterial nitroglycerin. Followup Doppler's office showed a right ABI of 0.91 with a high-frequency signal in that location. The patient does have right lower shimmy claudication and discomfort which he has a TENS unit prescribed for her. I suggested potentially symptoms are more related to circulation. Once he is recovered from his carotid endarterectomy we will discuss diamondback orbital rotational atherectomy, PTA and stenting of his right external iliac artery.      [name] MD FACP,FACC,FAHA, [facility] 05/18/2013 12:38 PM

## 2013-05-18 NOTE — Patient Instructions (Addendum)
Your physician wants you to follow-up in: 3 months with Dr Berry. You will receive a reminder letter in the mail two months in advance. If you don't receive a letter, please call our office to schedule the follow-up appointment.  

## 2013-05-18 NOTE — Assessment & Plan Note (Signed)
At the time of angiography on 04/25/13 I did note a 90% calcified right external iliac artery stenosis with 100 mmHg left gradient after administration of intra-arterial nitroglycerin. Followup Doppler's office showed a right ABI of 0.91 with a high-frequency signal in that location. The patient does have right lower shimmy claudication and discomfort which he has a TENS unit prescribed for her. I suggested potentially symptoms are more related to circulation. Once he is recovered from his carotid endarterectomy we will discuss diamondback orbital rotational atherectomy, PTA and stenting of his right external iliac artery.

## 2013-05-20 ENCOUNTER — Encounter: Payer: Self-pay | Admitting: Surgery

## 2013-05-23 ENCOUNTER — Other Ambulatory Visit: Payer: Self-pay

## 2013-05-23 ENCOUNTER — Ambulatory Visit (INDEPENDENT_AMBULATORY_CARE_PROVIDER_SITE_OTHER): Payer: Medicare Other | Admitting: Surgery

## 2013-05-23 ENCOUNTER — Encounter: Payer: Self-pay | Admitting: Surgery

## 2013-05-23 DIAGNOSIS — I6529 Occlusion and stenosis of unspecified carotid artery: Secondary | ICD-10-CM | POA: Insufficient documentation

## 2013-05-23 NOTE — Progress Notes (Signed)
[facility] [place]   Patient name: Albert Powell MRN: [id number] DOB: 01/08/1941 Sex: male   Referred by: Dr Berry  Reason for referral:  Chief Complaint  Patient presents with  . New Evaluation    carotid stenosis     HISTORY OF PRESENT ILLNESS: This is a very pleasant 72-year-old gentleman who is referred for evaluation of asymptomatic left carotid stenosis. This was detected on duplex ultrasound and confirmed via angiography. The patient denies having any symptoms. He denies numbness or weakness in either extremity. He denies slurred speech. He denies amaurosis fugax.  Patient has a history of coronary artery disease. He has undergone stenting secondary to angina. He is currently on aspirin for antiplatelet therapy. He is on 2 medications for blood pressure. He is on a statin 4 hypercholesterolemia. He is a diet controlled diabetic. He reports blood sugars in the 90-105 range. He is a former smoker.  The patient does report pain in his right hip and thigh with walking less than a block. He used to have cramping in his right calf however he has a TENS unit which  Has improved this pain.  Past Medical History  Diagnosis Date  . Onychomycosis   . GERD (gastroesophageal reflux disease)   . History of colonoscopy 2004    finding of tics and AMV only no polpys  . DDD (degenerative disc disease) 2008  . Cataract   . Diabetes mellitus   . Rosacea   . Hypertension   . COPD (chronic obstructive pulmonary disease)   . Hyperlipidemia 02/05/2013  . Tobacco abuse 02/05/2013  . Claudication     LEA DUPLEX, 07/07/2008 - Normal  . CAD (coronary artery disease)     stent to RCA 2003 also had 40% lesion at that time; NUCLEAR STRESS TEST, 01/16/2010 - normal  now with cath 80-90% stenosis in LAD, will try medical therapy if no improvement PCI  . Bradycardia, sinus 02/18/2013  . Carotid artery disease     Past Surgical History  Procedure Laterality Date  . Colon surgery   2005    renal pouch rectal anastimosis  . Cardiac surgery    . Coronary angioplasty with stent placement  09/2002    2 stents to RCA  . Cardiac catheterization  08/29/2002    2-vessel CAD with high-grade stenoses in RCA; stenting to RCA  . Cardiac catheterization  2014    80-90% lesion will try medical therapy    History   Social History  . Marital Status: Married    Spouse Name: N/A    Number of Children: N/A  . Years of Education: N/A   Occupational History  . Not on file.   Social History Main Topics  . Smoking status: Former Smoker -- 0.75 packs/day for 57 years    Types: Cigarettes  . Smokeless tobacco: Former User    Quit date: 10/04/2010     Comment: pt states that he is using the vapor cigs  . Alcohol Use: No  . Drug Use: No  . Sexual Activity: Not on file   Other Topics Concern  . Not on file   Social History Narrative  . No narrative on file    Family History  Problem Relation Age of Onset  . Heart disease Father     heart attack at 59  . Heart attack Father   . Hyperlipidemia Father   . Colonic polyp Mother     that bled out  . COPD Mother   .   Diabetes Mother   . Heart disease Sister   . Colonic polyp Sister   . Stroke Maternal Grandfather   . Heart attack Paternal Grandfather     Allergies as of 05/23/2013 - Review Complete 05/23/2013  Allergen Reaction Noted  . Fentanyl  05/07/2011  . Gabapentin  05/07/2011  . Lisinopril  05/07/2011  . Lyrica [pregabalin]  05/07/2011    Current Outpatient Prescriptions on File Prior to Visit  Medication Sig Dispense Refill  . amLODipine (NORVASC) 10 MG tablet Take 1 tablet by mouth every evening.      . aspirin 81 MG tablet Take 81 mg by mouth daily.        . atorvastatin (LIPITOR) 40 MG tablet Take 40 mg by mouth daily.      . citalopram (CELEXA) 20 MG tablet Take 20 mg by mouth daily.      . Fluticasone-Salmeterol (ADVAIR) 100-50 MCG/DOSE AEPB Inhale 1 puff into the lungs every 12 (twelve) hours.       . glucose blood test strip 1 each by Other route as needed. Use as instructed       . glucose monitoring kit (FREESTYLE) monitoring kit 1 each by Does not apply route as needed.        . HYDROcodone-acetaminophen (NORCO) 10-325 MG per tablet Take 1 tablet by mouth as needed.      . isosorbide mononitrate (IMDUR) 60 MG 24 hr tablet Take 1 tablet (60 mg total) by mouth daily.      . losartan-hydrochlorothiazide (HYZAAR) 100-25 MG per tablet Take 1 tablet by mouth every evening.      . metoprolol succinate (TOPROL XL) 25 MG 24 hr tablet Take 1/2 tablet (12.5mg) daily.  15 tablet  11  . metroNIDAZOLE (METROCREAM) 0.75 % cream Apply topically 2 (two) times daily.        . nitroGLYCERIN (NITROSTAT) 0.4 MG SL tablet Place 1 tablet (0.4 mg total) under the tongue every 5 (five) minutes as needed for chest pain.  25 tablet  3  . omeprazole (PRILOSEC) 20 MG capsule Take 20 mg by mouth daily.        No current facility-administered medications on file prior to visit.     REVIEW OF SYSTEMS: Cardiovascular: Positive for right leg pain with ambulation Pulmonary: No productive cough, asthma or wheezing. Neurologic: No weakness, paresthesias, aphasia, or amaurosis. No dizziness. Hematologic: No bleeding problems or clotting disorders. Musculoskeletal: No joint pain or joint swelling. Gastrointestinal: No blood in stool or hematemesis Genitourinary: No dysuria or hematuria. Psychiatric:: No history of major depression. Integumentary: No rashes or ulcers. Constitutional: No fever or chills.  PHYSICAL EXAMINATION: General: The patient appears their stated age.  Vital signs are BP 137/68  Pulse 63  Ht 5' 9" (1.753 m)  Wt 231 lb 11.2 oz (105.098 kg)  BMI 34.2 kg/m2  SpO2 98% HEENT:  No gross abnormalities Pulmonary: Respirations are non-labored Musculoskeletal: There are no major deformities.   Neurologic: No focal weakness or paresthesias are detected, Skin: There are no ulcer or rashes  noted. Psychiatric: The patient has normal affect. Cardiovascular: There is a regular rate and rhythm without significant murmur appreciated. Bilateral carotid bruits. Palpable radial pulse  Diagnostic Studies: I have reviewed his outside duplex which shows a 70-99% left carotid stenosis and less than 50% right carotid stenosis. I have also reviewed his angiography which reveals a high-grade, 95% stenosis within the left internal carotid artery, approximately 1.5 cm above the bifurcation   Assessment:  Asymptomatic   left carotid stenosis Plan: The patient appeared to have his stenosis treated. The he has not been approved for stenting, and therefore carotid endarterectomy has been recommended. I discussed the details of the procedure, as well as the risks and benefits which include but are not limited to the risk of stroke, bleeding, cardiopulmonary complications, and nerve injury. At his request the operation has been scheduled for Thursday, September 18.     V. Wells Brabham IV, M.D. [facility] [place] Office: [phone] Pager:  [phone]   

## 2013-05-30 ENCOUNTER — Encounter (HOSPITAL_COMMUNITY): Payer: Self-pay | Admitting: Pharmacy Technician

## 2013-05-31 ENCOUNTER — Encounter (HOSPITAL_COMMUNITY)
Admission: RE | Admit: 2013-05-31 | Discharge: 2013-05-31 | Disposition: A | Discharge status: Home / Self Care | Payer: Medicare Other | Source: Ambulatory Visit | Attending: Anesthesiology | Admitting: Anesthesiology

## 2013-05-31 ENCOUNTER — Encounter (HOSPITAL_COMMUNITY)
Admission: RE | Admit: 2013-05-31 | Discharge: 2013-05-31 | Disposition: A | Discharge status: Home / Self Care | Payer: Medicare Other | Source: Ambulatory Visit | Attending: Surgery | Admitting: Surgery

## 2013-05-31 ENCOUNTER — Encounter (HOSPITAL_COMMUNITY): Payer: Self-pay

## 2013-05-31 DIAGNOSIS — I6529 Occlusion and stenosis of unspecified carotid artery: Secondary | ICD-10-CM | POA: Diagnosis not present

## 2013-05-31 DIAGNOSIS — I10 Essential (primary) hypertension: Secondary | ICD-10-CM | POA: Diagnosis not present

## 2013-05-31 DIAGNOSIS — J449 Chronic obstructive pulmonary disease, unspecified: Secondary | ICD-10-CM | POA: Diagnosis not present

## 2013-05-31 DIAGNOSIS — K449 Diaphragmatic hernia without obstruction or gangrene: Secondary | ICD-10-CM | POA: Diagnosis present

## 2013-05-31 DIAGNOSIS — K219 Gastro-esophageal reflux disease without esophagitis: Secondary | ICD-10-CM | POA: Diagnosis not present

## 2013-05-31 DIAGNOSIS — J4489 Other specified chronic obstructive pulmonary disease: Secondary | ICD-10-CM | POA: Diagnosis not present

## 2013-05-31 DIAGNOSIS — E785 Hyperlipidemia, unspecified: Secondary | ICD-10-CM | POA: Diagnosis present

## 2013-05-31 DIAGNOSIS — Z79899 Other long term (current) drug therapy: Secondary | ICD-10-CM | POA: Diagnosis not present

## 2013-05-31 DIAGNOSIS — Z9861 Coronary angioplasty status: Secondary | ICD-10-CM | POA: Diagnosis not present

## 2013-05-31 DIAGNOSIS — E119 Type 2 diabetes mellitus without complications: Secondary | ICD-10-CM | POA: Diagnosis not present

## 2013-05-31 DIAGNOSIS — M25559 Pain in unspecified hip: Secondary | ICD-10-CM | POA: Diagnosis present

## 2013-05-31 DIAGNOSIS — Z87891 Personal history of nicotine dependence: Secondary | ICD-10-CM | POA: Diagnosis not present

## 2013-05-31 DIAGNOSIS — B351 Tinea unguium: Secondary | ICD-10-CM | POA: Diagnosis present

## 2013-05-31 DIAGNOSIS — Z7982 Long term (current) use of aspirin: Secondary | ICD-10-CM | POA: Diagnosis not present

## 2013-05-31 DIAGNOSIS — I739 Peripheral vascular disease, unspecified: Secondary | ICD-10-CM | POA: Diagnosis present

## 2013-05-31 DIAGNOSIS — I2510 Atherosclerotic heart disease of native coronary artery without angina pectoris: Secondary | ICD-10-CM | POA: Diagnosis not present

## 2013-05-31 DIAGNOSIS — L719 Rosacea, unspecified: Secondary | ICD-10-CM | POA: Diagnosis present

## 2013-05-31 DIAGNOSIS — IMO0002 Reserved for concepts with insufficient information to code with codable children: Secondary | ICD-10-CM | POA: Diagnosis present

## 2013-05-31 DIAGNOSIS — E7800 Pure hypercholesterolemia, unspecified: Secondary | ICD-10-CM | POA: Diagnosis present

## 2013-05-31 DIAGNOSIS — D62 Acute posthemorrhagic anemia: Secondary | ICD-10-CM | POA: Diagnosis not present

## 2013-05-31 HISTORY — DX: Personal history of other diseases of the digestive system: Z87.19

## 2013-05-31 LAB — URINALYSIS, ROUTINE W REFLEX MICROSCOPIC
Ketones, ur: 15 mg/dL — AB
Nitrite: NEGATIVE
pH: 6 (ref 5.0–8.0)

## 2013-05-31 LAB — PROTIME-INR
INR: 0.93 (ref 0.00–1.49)
Prothrombin Time: 12.3 seconds (ref 11.6–15.2)

## 2013-05-31 LAB — CBC
HCT: 41.2 % (ref 39.0–52.0)
MCH: 30.4 pg (ref 26.0–34.0)
MCHC: 34 g/dL (ref 30.0–36.0)
MCV: 89.4 fL (ref 78.0–100.0)
RDW: 13.7 % (ref 11.5–15.5)

## 2013-05-31 LAB — COMPREHENSIVE METABOLIC PANEL
Albumin: 3.9 g/dL (ref 3.5–5.2)
BUN: 15 mg/dL (ref 6–23)
Calcium: 9.6 mg/dL (ref 8.4–10.5)
Creatinine, Ser: 0.93 mg/dL (ref 0.50–1.35)
Total Bilirubin: 0.4 mg/dL (ref 0.3–1.2)
Total Protein: 7 g/dL (ref 6.0–8.3)

## 2013-05-31 LAB — TYPE AND SCREEN
ABO/RH(D): O POS
Antibody Screen: NEGATIVE

## 2013-05-31 LAB — SURGICAL PCR SCREEN: MRSA, PCR: POSITIVE — AB

## 2013-05-31 LAB — ABO/RH: ABO/RH(D): O POS

## 2013-05-31 NOTE — Pre-Procedure Instructions (Addendum)
Albert Powell  05/31/2013   Your procedure is scheduled on:06/02/13  Report to [facility] at 630AM.  Call this number if you have problems the morning of surgery: [phone]   Remember:   Do not eat food or drink liquids after midnight.   Take these medicines the morning of surgery with A SIP OF WATER:amlodipine, celexa, imdur, metoprolol, omeprazole   Do not wear jewelry, make-up or nail polish.  Do not wear lotions, powders, or perfumes. You may wear deodorant.  Do not shave 48 hours prior to surgery. Men may shave face and neck.  Do not bring valuables to the hospital.  [facility] is not responsible                   for any belongings or valuables.  Contacts, dentures or bridgework may not be worn into surgery.  Leave suitcase in the car. After surgery it may be brought to your room.  For patients admitted to the hospital, checkout time is 11:00 AM the day of  discharge.   Patients discharged the day of surgery will not be allowed to drive  home.  Name and phone number of your driver:   Special Instructions: Shower using CHG 2 nights before surgery and the night before surgery.  If you shower the day of surgery use CHG.  Use special wash - you have one bottle of CHG for all showers.  You should use approximately 1/3 of the bottle for each shower.   Please read over the following fact sheets that you were given: Pain Booklet, Coughing and Deep Breathing, Blood Transfusion Information, MRSA Information and Surgical Site Infection Prevention

## 2013-06-01 ENCOUNTER — Other Ambulatory Visit: Payer: Self-pay | Admitting: *Deleted

## 2013-06-01 DIAGNOSIS — Z22322 Carrier or suspected carrier of Methicillin resistant Staphylococcus aureus: Secondary | ICD-10-CM

## 2013-06-01 MED ORDER — MUPIROCIN 2 % EX OINT: TOPICAL_OINTMENT | CUTANEOUS | Status: DC

## 2013-06-01 MED ORDER — DEXTROSE 5 % IV SOLN
1.5000 g | INTRAVENOUS | Status: AC
  Administered 2013-06-02: 1.5 g via INTRAVENOUS
  Filled 2013-06-01: qty 1.5

## 2013-06-01 NOTE — Progress Notes (Signed)
I spoke with Mrs Route and confirmed new arrival time of 0530.

## 2013-06-02 ENCOUNTER — Encounter (HOSPITAL_COMMUNITY): Payer: Self-pay | Admitting: *Deleted

## 2013-06-02 ENCOUNTER — Encounter (HOSPITAL_COMMUNITY)
Admission: RE | Disposition: A | Discharge status: Home / Self Care | Payer: Self-pay | Source: Ambulatory Visit | Attending: Surgery

## 2013-06-02 ENCOUNTER — Encounter (HOSPITAL_COMMUNITY): Payer: Self-pay | Admitting: Anesthesiology

## 2013-06-02 ENCOUNTER — Inpatient Hospital Stay (HOSPITAL_COMMUNITY): Payer: Medicare Other | Admitting: Anesthesiology

## 2013-06-02 ENCOUNTER — Inpatient Hospital Stay (HOSPITAL_COMMUNITY)
Admission: RE | Admit: 2013-06-02 | Discharge: 2013-06-03 | DRG: 038 | Disposition: A | Discharge status: Home / Self Care | Payer: Medicare Other | Source: Ambulatory Visit | Attending: Surgery | Admitting: Surgery

## 2013-06-02 DIAGNOSIS — Z87891 Personal history of nicotine dependence: Secondary | ICD-10-CM

## 2013-06-02 DIAGNOSIS — M25559 Pain in unspecified hip: Secondary | ICD-10-CM | POA: Diagnosis present

## 2013-06-02 DIAGNOSIS — I2510 Atherosclerotic heart disease of native coronary artery without angina pectoris: Secondary | ICD-10-CM | POA: Diagnosis present

## 2013-06-02 DIAGNOSIS — E7800 Pure hypercholesterolemia, unspecified: Secondary | ICD-10-CM | POA: Diagnosis present

## 2013-06-02 DIAGNOSIS — J449 Chronic obstructive pulmonary disease, unspecified: Secondary | ICD-10-CM | POA: Diagnosis present

## 2013-06-02 DIAGNOSIS — I6529 Occlusion and stenosis of unspecified carotid artery: Secondary | ICD-10-CM

## 2013-06-02 DIAGNOSIS — D62 Acute posthemorrhagic anemia: Secondary | ICD-10-CM | POA: Diagnosis not present

## 2013-06-02 DIAGNOSIS — E785 Hyperlipidemia, unspecified: Secondary | ICD-10-CM | POA: Diagnosis present

## 2013-06-02 DIAGNOSIS — L719 Rosacea, unspecified: Secondary | ICD-10-CM | POA: Diagnosis present

## 2013-06-02 DIAGNOSIS — I739 Peripheral vascular disease, unspecified: Secondary | ICD-10-CM | POA: Diagnosis present

## 2013-06-02 DIAGNOSIS — B351 Tinea unguium: Secondary | ICD-10-CM | POA: Diagnosis present

## 2013-06-02 DIAGNOSIS — K219 Gastro-esophageal reflux disease without esophagitis: Secondary | ICD-10-CM | POA: Diagnosis present

## 2013-06-02 DIAGNOSIS — I10 Essential (primary) hypertension: Secondary | ICD-10-CM | POA: Diagnosis present

## 2013-06-02 DIAGNOSIS — K449 Diaphragmatic hernia without obstruction or gangrene: Secondary | ICD-10-CM | POA: Diagnosis present

## 2013-06-02 DIAGNOSIS — J4489 Other specified chronic obstructive pulmonary disease: Secondary | ICD-10-CM | POA: Diagnosis present

## 2013-06-02 DIAGNOSIS — Z9861 Coronary angioplasty status: Secondary | ICD-10-CM

## 2013-06-02 DIAGNOSIS — E119 Type 2 diabetes mellitus without complications: Secondary | ICD-10-CM | POA: Diagnosis not present

## 2013-06-02 DIAGNOSIS — Z79899 Other long term (current) drug therapy: Secondary | ICD-10-CM

## 2013-06-02 DIAGNOSIS — IMO0002 Reserved for concepts with insufficient information to code with codable children: Secondary | ICD-10-CM | POA: Diagnosis present

## 2013-06-02 DIAGNOSIS — Z7982 Long term (current) use of aspirin: Secondary | ICD-10-CM

## 2013-06-02 HISTORY — PX: ENDARTERECTOMY: SHX5162

## 2013-06-02 HISTORY — PX: PATCH ANGIOPLASTY: SHX6230

## 2013-06-02 LAB — CBC
HCT: 31.7 % — ABNORMAL LOW (ref 39.0–52.0)
Hemoglobin: 11.1 g/dL — ABNORMAL LOW (ref 13.0–17.0)
MCHC: 35 g/dL (ref 30.0–36.0)
MCV: 89.3 fL (ref 78.0–100.0)

## 2013-06-02 LAB — CREATININE, SERUM: GFR calc non Af Amer: 73 mL/min — ABNORMAL LOW (ref >90)

## 2013-06-02 LAB — GLUCOSE, CAPILLARY: Glucose-Capillary: 123 mg/dL — ABNORMAL HIGH (ref 70–99)

## 2013-06-02 SURGERY — ENDARTERECTOMY, CAROTID
Anesthesia: General | Site: Neck | Laterality: Left | Wound class: Clean

## 2013-06-02 MED ORDER — PROMETHAZINE HCL 25 MG/ML IJ SOLN
INTRAMUSCULAR | Status: AC
  Filled 2013-06-02: qty 1

## 2013-06-02 MED ORDER — ACETAMINOPHEN 650 MG RE SUPP: 325.0000 mg | RECTAL | Status: DC | PRN

## 2013-06-02 MED ORDER — ISOSORBIDE MONONITRATE ER 60 MG PO TB24
60.0000 mg | ORAL_TABLET | Freq: Every day | ORAL | Status: DC
Start: 2013-06-03 — End: 2013-06-03
  Administered 2013-06-03: 60 mg via ORAL
  Filled 2013-06-02: qty 1

## 2013-06-02 MED ORDER — DOCUSATE SODIUM 100 MG PO CAPS
100.0000 mg | ORAL_CAPSULE | Freq: Every day | ORAL | Status: DC
  Administered 2013-06-03: 100 mg via ORAL
  Filled 2013-06-02: qty 1

## 2013-06-02 MED ORDER — ZOLPIDEM TARTRATE 5 MG PO TABS: 5.0000 mg | ORAL_TABLET | Freq: Every evening | ORAL | Status: DC | PRN

## 2013-06-02 MED ORDER — PHENYLEPHRINE HCL 10 MG/ML IJ SOLN
10.0000 mg | INTRAVENOUS | Status: DC | PRN
  Administered 2013-06-02: 10 ug/min via INTRAVENOUS

## 2013-06-02 MED ORDER — PROPOFOL 10 MG/ML IV BOLUS
INTRAVENOUS | Status: DC | PRN
  Administered 2013-06-02: 150 mg via INTRAVENOUS
  Administered 2013-06-02: 50 mg via INTRAVENOUS

## 2013-06-02 MED ORDER — INFLUENZA VAC SPLIT QUAD 0.5 ML IM SUSP
0.5000 mL | INTRAMUSCULAR | Status: AC
  Administered 2013-06-03: 0.5 mL via INTRAMUSCULAR
  Filled 2013-06-02: qty 0.5

## 2013-06-02 MED ORDER — LOSARTAN POTASSIUM 50 MG PO TABS
100.0000 mg | ORAL_TABLET | Freq: Every evening | ORAL | Status: DC
  Filled 2013-06-02: qty 2

## 2013-06-02 MED ORDER — OXYCODONE HCL 5 MG/5ML PO SOLN: 5.0000 mg | Freq: Once | ORAL | Status: DC | PRN

## 2013-06-02 MED ORDER — ONDANSETRON HCL 4 MG/2ML IJ SOLN: 4.0000 mg | Freq: Four times a day (QID) | INTRAMUSCULAR | Status: DC | PRN

## 2013-06-02 MED ORDER — NITROGLYCERIN 0.4 MG SL SUBL: 0.4000 mg | SUBLINGUAL_TABLET | SUBLINGUAL | Status: DC | PRN

## 2013-06-02 MED ORDER — ATORVASTATIN CALCIUM 40 MG PO TABS
40.0000 mg | ORAL_TABLET | Freq: Every day | ORAL | Status: DC
  Administered 2013-06-02: 40 mg via ORAL
  Filled 2013-06-02 (×2): qty 1

## 2013-06-02 MED ORDER — ARTIFICIAL TEARS OP OINT
TOPICAL_OINTMENT | OPHTHALMIC | Status: DC | PRN
  Administered 2013-06-02: 1 via OPHTHALMIC

## 2013-06-02 MED ORDER — DOPAMINE-DEXTROSE 3.2-5 MG/ML-% IV SOLN: 3.0000 ug/kg/min | INTRAVENOUS | Status: DC

## 2013-06-02 MED ORDER — DEXTROSE 5 % IV SOLN
1.5000 g | Freq: Two times a day (BID) | INTRAVENOUS | Status: AC
  Administered 2013-06-02 – 2013-06-03 (×2): 1.5 g via INTRAVENOUS
  Filled 2013-06-02 (×2): qty 1.5

## 2013-06-02 MED ORDER — INSULIN ASPART 100 UNIT/ML ~~LOC~~ SOLN
SUBCUTANEOUS | Status: AC
  Filled 2013-06-02: qty 1

## 2013-06-02 MED ORDER — OXYCODONE HCL 5 MG PO TABS
5.0000 mg | ORAL_TABLET | ORAL | Status: DC | PRN
  Administered 2013-06-02: 5 mg via ORAL
  Filled 2013-06-02: qty 1

## 2013-06-02 MED ORDER — HYDRALAZINE HCL 20 MG/ML IJ SOLN
INTRAMUSCULAR | Status: DC | PRN
  Administered 2013-06-02 (×4): 5 mg via INTRAVENOUS

## 2013-06-02 MED ORDER — ENOXAPARIN SODIUM 40 MG/0.4ML ~~LOC~~ SOLN
40.0000 mg | SUBCUTANEOUS | Status: DC
  Filled 2013-06-02: qty 0.4

## 2013-06-02 MED ORDER — PHENYLEPHRINE HCL 10 MG/ML IJ SOLN
INTRAMUSCULAR | Status: DC | PRN
  Administered 2013-06-02 (×9): 80 ug via INTRAVENOUS

## 2013-06-02 MED ORDER — HEPARIN SODIUM (PORCINE) 1000 UNIT/ML IJ SOLN
INTRAMUSCULAR | Status: DC | PRN
  Administered 2013-06-02 (×2): 1000 [IU] via INTRAVENOUS
  Administered 2013-06-02: 9000 [IU] via INTRAVENOUS

## 2013-06-02 MED ORDER — PHENOL 1.4 % MT LIQD
1.0000 | OROMUCOSAL | Status: DC | PRN
  Administered 2013-06-02: 1 via OROMUCOSAL
  Filled 2013-06-02 (×2): qty 177

## 2013-06-02 MED ORDER — HYDROMORPHONE HCL PF 1 MG/ML IJ SOLN
0.2500 mg | INTRAMUSCULAR | Status: DC | PRN
  Administered 2013-06-02 (×2): 0.5 mg via INTRAVENOUS

## 2013-06-02 MED ORDER — ROCURONIUM BROMIDE 100 MG/10ML IV SOLN
INTRAVENOUS | Status: DC | PRN
  Administered 2013-06-02: 50 mg via INTRAVENOUS
  Administered 2013-06-02: 10 mg via INTRAVENOUS

## 2013-06-02 MED ORDER — GUAIFENESIN-DM 100-10 MG/5ML PO SYRP: 15.0000 mL | ORAL_SOLUTION | ORAL | Status: DC | PRN

## 2013-06-02 MED ORDER — SUCCINYLCHOLINE CHLORIDE 20 MG/ML IJ SOLN
INTRAMUSCULAR | Status: DC | PRN
  Administered 2013-06-02: 110 mg via INTRAVENOUS

## 2013-06-02 MED ORDER — MUPIROCIN 2 % EX OINT
TOPICAL_OINTMENT | Freq: Every day | CUTANEOUS | Status: DC
Start: 2013-06-03 — End: 2013-06-03
  Administered 2013-06-03: 1 via TOPICAL
  Filled 2013-06-02: qty 22

## 2013-06-02 MED ORDER — METRONIDAZOLE 0.75 % EX CREA: 1.0000 "application " | TOPICAL_CREAM | Freq: Two times a day (BID) | CUTANEOUS | Status: DC

## 2013-06-02 MED ORDER — LIDOCAINE HCL (PF) 1 % IJ SOLN
INTRAMUSCULAR | Status: AC
  Filled 2013-06-02: qty 30

## 2013-06-02 MED ORDER — METRONIDAZOLE 0.75 % EX GEL
Freq: Two times a day (BID) | CUTANEOUS | Status: DC
  Administered 2013-06-02 (×2): via TOPICAL
  Administered 2013-06-03: 1 via TOPICAL
  Filled 2013-06-02: qty 45

## 2013-06-02 MED ORDER — PROMETHAZINE HCL 25 MG/ML IJ SOLN
6.2500 mg | INTRAMUSCULAR | Status: DC | PRN
  Administered 2013-06-02: 6.25 mg via INTRAVENOUS

## 2013-06-02 MED ORDER — OXYCODONE HCL 5 MG PO TABS: 5.0000 mg | ORAL_TABLET | Freq: Once | ORAL | Status: DC | PRN

## 2013-06-02 MED ORDER — FENTANYL CITRATE 0.05 MG/ML IJ SOLN
INTRAMUSCULAR | Status: DC | PRN
  Administered 2013-06-02: 50 ug via INTRAVENOUS
  Administered 2013-06-02 (×2): 100 ug via INTRAVENOUS

## 2013-06-02 MED ORDER — SODIUM CHLORIDE 0.9 % IV SOLN: INTRAVENOUS | Status: DC

## 2013-06-02 MED ORDER — AMLODIPINE BESYLATE 10 MG PO TABS
10.0000 mg | ORAL_TABLET | Freq: Every evening | ORAL | Status: DC
  Filled 2013-06-02: qty 1

## 2013-06-02 MED ORDER — ONDANSETRON HCL 4 MG/2ML IJ SOLN
INTRAMUSCULAR | Status: DC | PRN
  Administered 2013-06-02: 4 mg via INTRAVENOUS

## 2013-06-02 MED ORDER — LACTATED RINGERS IV SOLN
INTRAVENOUS | Status: DC | PRN
  Administered 2013-06-02 (×2): via INTRAVENOUS

## 2013-06-02 MED ORDER — MORPHINE SULFATE 2 MG/ML IJ SOLN: 2.0000 mg | INTRAMUSCULAR | Status: DC | PRN

## 2013-06-02 MED ORDER — PROTAMINE SULFATE 10 MG/ML IV SOLN
INTRAVENOUS | Status: DC | PRN
  Administered 2013-06-02: 50 mg via INTRAVENOUS

## 2013-06-02 MED ORDER — GLYCOPYRROLATE 0.2 MG/ML IJ SOLN
INTRAMUSCULAR | Status: DC | PRN
  Administered 2013-06-02: 0.2 mg via INTRAVENOUS
  Administered 2013-06-02: 0.6 mg via INTRAVENOUS

## 2013-06-02 MED ORDER — ALUM & MAG HYDROXIDE-SIMETH 200-200-20 MG/5ML PO SUSP: 15.0000 mL | ORAL | Status: DC | PRN

## 2013-06-02 MED ORDER — CITALOPRAM HYDROBROMIDE 20 MG PO TABS
20.0000 mg | ORAL_TABLET | Freq: Every day | ORAL | Status: DC
  Administered 2013-06-03: 20 mg via ORAL
  Filled 2013-06-02: qty 1

## 2013-06-02 MED ORDER — LOSARTAN POTASSIUM-HCTZ 100-25 MG PO TABS: 1.0000 | ORAL_TABLET | Freq: Every evening | ORAL | Status: DC

## 2013-06-02 MED ORDER — SODIUM CHLORIDE 0.9 % IV SOLN
500.0000 mL | Freq: Once | INTRAVENOUS | Status: AC | PRN
  Administered 2013-06-02: 500 mL via INTRAVENOUS

## 2013-06-02 MED ORDER — LABETALOL HCL 5 MG/ML IV SOLN: 10.0000 mg | INTRAVENOUS | Status: DC | PRN

## 2013-06-02 MED ORDER — HEMOSTATIC AGENTS (NO CHARGE) OPTIME
TOPICAL | Status: DC | PRN
  Administered 2013-06-02: 1 via TOPICAL

## 2013-06-02 MED ORDER — INSULIN ASPART 100 UNIT/ML ~~LOC~~ SOLN
0.0000 [IU] | Freq: Three times a day (TID) | SUBCUTANEOUS | Status: DC
  Administered 2013-06-02: 2 [IU] via SUBCUTANEOUS

## 2013-06-02 MED ORDER — ESMOLOL HCL 10 MG/ML IV SOLN
INTRAVENOUS | Status: DC | PRN
  Administered 2013-06-02 (×2): 10 mg via INTRAVENOUS

## 2013-06-02 MED ORDER — 0.9 % SODIUM CHLORIDE (POUR BTL) OPTIME
TOPICAL | Status: DC | PRN
  Administered 2013-06-02: 2000 mL

## 2013-06-02 MED ORDER — MOMETASONE FURO-FORMOTEROL FUM 100-5 MCG/ACT IN AERO
2.0000 | INHALATION_SPRAY | Freq: Two times a day (BID) | RESPIRATORY_TRACT | Status: DC
  Administered 2013-06-02 – 2013-06-03 (×2): 2 via RESPIRATORY_TRACT
  Filled 2013-06-02: qty 8.8

## 2013-06-02 MED ORDER — PANTOPRAZOLE SODIUM 40 MG PO TBEC
40.0000 mg | DELAYED_RELEASE_TABLET | Freq: Every day | ORAL | Status: DC
  Administered 2013-06-03: 40 mg via ORAL
  Filled 2013-06-02: qty 1

## 2013-06-02 MED ORDER — LIDOCAINE HCL (CARDIAC) 20 MG/ML IV SOLN
INTRAVENOUS | Status: DC | PRN
  Administered 2013-06-02: 100 mg via INTRAVENOUS

## 2013-06-02 MED ORDER — HYDROCHLOROTHIAZIDE 25 MG PO TABS
25.0000 mg | ORAL_TABLET | Freq: Every evening | ORAL | Status: DC
Start: 2013-06-03 — End: 2013-06-03
  Filled 2013-06-02: qty 1

## 2013-06-02 MED ORDER — DOPAMINE-DEXTROSE 3.2-5 MG/ML-% IV SOLN
3.0000 ug/kg/min | INTRAVENOUS | Status: DC
  Administered 2013-06-02: 5 ug/kg/min via INTRAVENOUS

## 2013-06-02 MED ORDER — SODIUM CHLORIDE 0.9 % IR SOLN
Status: DC | PRN
  Administered 2013-06-02: 08:00:00

## 2013-06-02 MED ORDER — METOPROLOL SUCCINATE 12.5 MG HALF TABLET
12.5000 mg | ORAL_TABLET | Freq: Every day | ORAL | Status: DC
  Administered 2013-06-03: 12.5 mg via ORAL
  Filled 2013-06-02: qty 1

## 2013-06-02 MED ORDER — OXYCODONE HCL 5 MG PO TABS: 5.0000 mg | ORAL_TABLET | Freq: Four times a day (QID) | ORAL | Status: DC | PRN

## 2013-06-02 MED ORDER — ACETAMINOPHEN 325 MG PO TABS
325.0000 mg | ORAL_TABLET | ORAL | Status: DC | PRN
  Administered 2013-06-02: 650 mg via ORAL
  Filled 2013-06-02: qty 2

## 2013-06-02 MED ORDER — HYDRALAZINE HCL 20 MG/ML IJ SOLN: 10.0000 mg | INTRAMUSCULAR | Status: DC | PRN

## 2013-06-02 MED ORDER — SODIUM CHLORIDE 0.9 % IV SOLN
INTRAVENOUS | Status: DC
  Administered 2013-06-02: 1000 mL via INTRAVENOUS

## 2013-06-02 MED ORDER — METOPROLOL TARTRATE 1 MG/ML IV SOLN: 2.0000 mg | INTRAVENOUS | Status: DC | PRN

## 2013-06-02 MED ORDER — ASPIRIN EC 81 MG PO TBEC
81.0000 mg | DELAYED_RELEASE_TABLET | Freq: Every day | ORAL | Status: DC
  Administered 2013-06-02 – 2013-06-03 (×2): 81 mg via ORAL
  Filled 2013-06-02 (×2): qty 1

## 2013-06-02 MED ORDER — LACTATED RINGERS IV SOLN
INTRAVENOUS | Status: DC | PRN
  Administered 2013-06-02: 07:00:00 via INTRAVENOUS

## 2013-06-02 MED ORDER — DOPAMINE-DEXTROSE 3.2-5 MG/ML-% IV SOLN
INTRAVENOUS | Status: AC
  Filled 2013-06-02: qty 250

## 2013-06-02 MED ORDER — POTASSIUM CHLORIDE CRYS ER 20 MEQ PO TBCR: 20.0000 meq | EXTENDED_RELEASE_TABLET | Freq: Once | ORAL | Status: AC | PRN

## 2013-06-02 MED ORDER — HYDROMORPHONE HCL PF 1 MG/ML IJ SOLN
INTRAMUSCULAR | Status: AC
  Filled 2013-06-02: qty 1

## 2013-06-02 MED ORDER — NEOSTIGMINE METHYLSULFATE 1 MG/ML IJ SOLN
INTRAMUSCULAR | Status: DC | PRN
  Administered 2013-06-02: 4 mg via INTRAVENOUS

## 2013-06-02 SURGICAL SUPPLY — 52 items
ADH SKN CLS APL DERMABOND .7 (GAUZE/BANDAGES/DRESSINGS) ×1
CANISTER SUCTION 2500CC (MISCELLANEOUS) ×2 IMPLANT
CATH ROBINSON RED A/P 18FR (CATHETERS) ×2 IMPLANT
CATH SUCT 10FR WHISTLE TIP (CATHETERS) ×2 IMPLANT
CLIP TI MEDIUM 24 (CLIP) ×2 IMPLANT
CLIP TI WIDE RED SMALL 24 (CLIP) ×2 IMPLANT
COVER SURGICAL LIGHT HANDLE (MISCELLANEOUS) ×2 IMPLANT
CRADLE DONUT ADULT HEAD (MISCELLANEOUS) ×2 IMPLANT
DERMABOND ADVANCED (GAUZE/BANDAGES/DRESSINGS) ×1
DERMABOND ADVANCED .7 DNX12 (GAUZE/BANDAGES/DRESSINGS) ×1 IMPLANT
DRAIN CHANNEL 15F RND FF W/TCR (WOUND CARE) IMPLANT
DRAPE INCISE 23X17 IOBAN STRL (DRAPES) ×1
DRAPE INCISE IOBAN 23X17 STRL (DRAPES) ×1 IMPLANT
DRAPE WARM FLUID 44X44 (DRAPE) ×2 IMPLANT
ELECT REM PT RETURN 9FT ADLT (ELECTROSURGICAL) ×2
ELECTRODE REM PT RTRN 9FT ADLT (ELECTROSURGICAL) ×1 IMPLANT
EVACUATOR SILICONE 100CC (DRAIN) IMPLANT
GLOVE BIO SURGEON STRL SZ 6.5 (GLOVE) ×4 IMPLANT
GLOVE BIO SURGEON STRL SZ7.5 (GLOVE) ×4 IMPLANT
GLOVE BIOGEL PI IND STRL 7.0 (GLOVE) ×1 IMPLANT
GLOVE BIOGEL PI IND STRL 7.5 (GLOVE) ×1 IMPLANT
GLOVE BIOGEL PI INDICATOR 7.0 (GLOVE) ×1
GLOVE BIOGEL PI INDICATOR 7.5 (GLOVE) ×1
GLOVE SURG SS PI 6.5 STRL IVOR (GLOVE) ×2 IMPLANT
GLOVE SURG SS PI 7.5 STRL IVOR (GLOVE) ×3 IMPLANT
GOWN PREVENTION PLUS XXLARGE (GOWN DISPOSABLE) ×2 IMPLANT
GOWN STRL NON-REIN LRG LVL3 (GOWN DISPOSABLE) ×10 IMPLANT
HEMOSTAT SNOW SURGICEL 2X4 (HEMOSTASIS) ×2 IMPLANT
INSERT FOGARTY SM (MISCELLANEOUS) IMPLANT
KIT BASIN OR (CUSTOM PROCEDURE TRAY) ×2 IMPLANT
KIT ROOM TURNOVER OR (KITS) ×2 IMPLANT
NS IRRIG 1000ML POUR BTL (IV SOLUTION) ×4 IMPLANT
PACK CAROTID (CUSTOM PROCEDURE TRAY) ×2 IMPLANT
PAD ARMBOARD 7.5X6 YLW CONV (MISCELLANEOUS) ×4 IMPLANT
PATCH VASCULAR VASCU GUARD 1X6 (Vascular Products) ×2 IMPLANT
SHUNT CAROTID BYPASS 10 (VASCULAR PRODUCTS) IMPLANT
SHUNT CAROTID BYPASS 12FRX15.5 (VASCULAR PRODUCTS) IMPLANT
SPONGE INTESTINAL PEANUT (DISPOSABLE) ×2 IMPLANT
SUT ETHILON 3 0 PS 1 (SUTURE) IMPLANT
SUT PROLENE 6 0 BV (SUTURE) ×4 IMPLANT
SUT PROLENE 7 0 BV 1 (SUTURE) IMPLANT
SUT PROLENE 7 0 BV1 MDA (SUTURE) ×2 IMPLANT
SUT SILK 3 0 (SUTURE) ×2
SUT SILK 3 0 TIES 17X18 (SUTURE)
SUT SILK 3-0 18XBRD TIE 12 (SUTURE) ×1 IMPLANT
SUT SILK 3-0 18XBRD TIE BLK (SUTURE) IMPLANT
SUT VIC AB 3-0 SH 27 (SUTURE) ×4
SUT VIC AB 3-0 SH 27X BRD (SUTURE) ×2 IMPLANT
SUT VICRYL 4-0 PS2 18IN ABS (SUTURE) ×2 IMPLANT
TOWEL OR 17X24 6PK STRL BLUE (TOWEL DISPOSABLE) ×2 IMPLANT
TOWEL OR 17X26 10 PK STRL BLUE (TOWEL DISPOSABLE) ×2 IMPLANT
WATER STERILE IRR 1000ML POUR (IV SOLUTION) ×2 IMPLANT

## 2013-06-02 NOTE — Transfer of Care (Signed)
Immediate Anesthesia Transfer of Care Note  Patient: Albert Powell  Procedure(s) Performed: Procedure(s): ENDARTERECTOMY CAROTID-LEFT (Left) PATCH ANGIOPLASTY (Left)  Patient Location: PACU  Anesthesia Type:General  Level of Consciousness: awake, alert , oriented and sedated  Airway & Oxygen Therapy: Patient Spontanous Breathing and Patient connected to nasal cannula oxygen  Post-op Assessment: Report given to PACU RN, Post -op Vital signs reviewed and stable and Patient moving all extremities  Post vital signs: Reviewed and stable  Complications: No apparent anesthesia complications

## 2013-06-02 NOTE — Anesthesia Preprocedure Evaluation (Signed)
Anesthesia Evaluation  Patient identified by MRN, date of birth, ID band  Reviewed: Allergy & Precautions, H&P , NPO status , Patient's Chart, lab work & pertinent test results  Airway Mallampati: II  Neck ROM: Full    Dental   Pulmonary COPD breath sounds clear to auscultation        Cardiovascular hypertension, + CAD and + Peripheral Vascular Disease + dysrhythmias Rhythm:Regular Rate:Normal     Neuro/Psych    GI/Hepatic hiatal hernia, GERD-  ,  Endo/Other  diabetes  Renal/GU      Musculoskeletal   Abdominal   Peds  Hematology   Anesthesia Other Findings   Reproductive/Obstetrics                           Anesthesia Physical Anesthesia Plan  ASA: III  Anesthesia Plan: General   Post-op Pain Management:    Induction: Intravenous  Airway Management Planned: Oral ETT  Additional Equipment: Arterial line  Intra-op Plan:   Post-operative Plan: Extubation in OR  Informed Consent: I have reviewed the patients History and Physical, chart, labs and discussed the procedure including the risks, benefits and alternatives for the proposed anesthesia with the patient or authorized representative who has indicated his/her understanding and acceptance.   Dental advisory given  Plan Discussed with: CRNA and Surgeon  Anesthesia Plan Comments:         Anesthesia Quick Evaluation

## 2013-06-02 NOTE — H&P (View-Only) (Signed)
[facility] [place]   Patient name: Albert Powell MRN: [id number] DOB: 01-09-1941 Sex: male   Referred by: Dr Allyson Sabal  Reason for referral:  Chief Complaint  Patient presents with  . New Evaluation    carotid stenosis     HISTORY OF PRESENT ILLNESS: This is a very pleasant 72 year old gentleman who is referred for evaluation of asymptomatic left carotid stenosis. This was detected on duplex ultrasound and confirmed via angiography. The patient denies having any symptoms. He denies numbness or weakness in either extremity. He denies slurred speech. He denies amaurosis fugax.  Patient has a history of coronary artery disease. He has undergone stenting secondary to angina. He is currently on aspirin for antiplatelet therapy. He is on 2 medications for blood pressure. He is on a statin 4 hypercholesterolemia. He is a diet controlled diabetic. He reports blood sugars in the 90-105 range. He is a former smoker.  The patient does report pain in his right hip and thigh with walking less than a block. He used to have cramping in his right calf however he has a TENS unit which  Has improved this pain.  Past Medical History  Diagnosis Date  . Onychomycosis   . GERD (gastroesophageal reflux disease)   . History of colonoscopy 2004    finding of tics and AMV only no polpys  . DDD (degenerative disc disease) 2008  . Cataract   . Diabetes mellitus   . Rosacea   . Hypertension   . COPD (chronic obstructive pulmonary disease)   . Hyperlipidemia 02/05/2013  . Tobacco abuse 02/05/2013  . Claudication     LEA DUPLEX, 07/07/2008 - Normal  . CAD (coronary artery disease)     stent to RCA 2003 also had 40% lesion at that time; NUCLEAR STRESS TEST, 01/16/2010 - normal  now with cath 80-90% stenosis in LAD, will try medical therapy if no improvement PCI  . Bradycardia, sinus 02/18/2013  . Carotid artery disease     Past Surgical History  Procedure Laterality Date  . Colon surgery   2005    renal pouch rectal anastimosis  . Cardiac surgery    . Coronary angioplasty with stent placement  09/2002    2 stents to RCA  . Cardiac catheterization  08/29/2002    2-vessel CAD with high-grade stenoses in RCA; stenting to RCA  . Cardiac catheterization  2014    80-90% lesion will try medical therapy    History   Social History  . Marital Status: Married    Spouse Name: N/A    Number of Children: N/A  . Years of Education: N/A   Occupational History  . Not on file.   Social History Main Topics  . Smoking status: Former Smoker -- 0.75 packs/day for 57 years    Types: Cigarettes  . Smokeless tobacco: Former Neurosurgeon    Quit date: 10/04/2010     Comment: pt states that he is using the vapor cigs  . Alcohol Use: No  . Drug Use: No  . Sexual Activity: Not on file   Other Topics Concern  . Not on file   Social History Narrative  . No narrative on file    Family History  Problem Relation Age of Onset  . Heart disease Father     heart attack at 11  . Heart attack Father   . Hyperlipidemia Father   . Colonic polyp Mother     that bled out  . COPD Mother   .  Diabetes Mother   . Heart disease Sister   . Colonic polyp Sister   . Stroke Maternal Grandfather   . Heart attack Paternal Grandfather     Allergies as of 05/23/2013 - Review Complete 05/23/2013  Allergen Reaction Noted  . Fentanyl  05/07/2011  . Gabapentin  05/07/2011  . Lisinopril  05/07/2011  . Lyrica [pregabalin]  05/07/2011    Current Outpatient Prescriptions on File Prior to Visit  Medication Sig Dispense Refill  . amLODipine (NORVASC) 10 MG tablet Take 1 tablet by mouth every evening.      [name] aspirin 81 MG tablet Take 81 mg by mouth daily.        [name] atorvastatin (LIPITOR) 40 MG tablet Take 40 mg by mouth daily.      . citalopram (CELEXA) 20 MG tablet Take 20 mg by mouth daily.      . Fluticasone-Salmeterol (ADVAIR) 100-50 MCG/DOSE AEPB Inhale 1 puff into the lungs every 12 (twelve) hours.       [name] glucose blood test strip 1 each by Other route as needed. Use as instructed       . glucose monitoring kit (FREESTYLE) monitoring kit 1 each by Does not apply route as needed.        [name] HYDROcodone-acetaminophen (NORCO) 10-325 MG per tablet Take 1 tablet by mouth as needed.      . isosorbide mononitrate (IMDUR) 60 MG 24 hr tablet Take 1 tablet (60 mg total) by mouth daily.      [name] losartan-hydrochlorothiazide (HYZAAR) 100-25 MG per tablet Take 1 tablet by mouth every evening.      . metoprolol succinate (TOPROL XL) 25 MG 24 hr tablet Take 1/2 tablet (12.5mg ) daily.  15 tablet  11  . metroNIDAZOLE (METROCREAM) 0.75 % cream Apply topically 2 (two) times daily.        . nitroGLYCERIN (NITROSTAT) 0.4 MG SL tablet Place 1 tablet (0.4 mg total) under the tongue every 5 (five) minutes as needed for chest pain.  25 tablet  3  . omeprazole (PRILOSEC) 20 MG capsule Take 20 mg by mouth daily.        No current facility-administered medications on file prior to visit.     REVIEW OF SYSTEMS: Cardiovascular: Positive for right leg pain with ambulation Pulmonary: No productive cough, asthma or wheezing. Neurologic: No weakness, paresthesias, aphasia, or amaurosis. No dizziness. Hematologic: No bleeding problems or clotting disorders. Musculoskeletal: No joint pain or joint swelling. Gastrointestinal: No blood in stool or hematemesis Genitourinary: No dysuria or hematuria. Psychiatric:: No history of major depression. Integumentary: No rashes or ulcers. Constitutional: No fever or chills.  PHYSICAL EXAMINATION: General: The patient appears their stated age.  Vital signs are BP 137/68  Pulse 63  Ht 5\' 9"  (1.753 m)  Wt 231 lb 11.2 oz (105.098 kg)  BMI 34.2 kg/m2  SpO2 98% HEENT:  No gross abnormalities Pulmonary: Respirations are non-labored Musculoskeletal: There are no major deformities.   Neurologic: No focal weakness or paresthesias are detected, Skin: There are no ulcer or rashes  noted. Psychiatric: The patient has normal affect. Cardiovascular: There is a regular rate and rhythm without significant murmur appreciated. Bilateral carotid bruits. Palpable radial pulse  Diagnostic Studies: I have reviewed his outside duplex which shows a 70-99% left carotid stenosis and less than 50% right carotid stenosis. I have also reviewed his angiography which reveals a high-grade, 95% stenosis within the left internal carotid artery, approximately 1.5 cm above the bifurcation   Assessment:  Asymptomatic  left carotid stenosis Plan: The patient appeared to have his stenosis treated. The he has not been approved for stenting, and therefore carotid endarterectomy has been recommended. I discussed the details of the procedure, as well as the risks and benefits which include but are not limited to the risk of stroke, bleeding, cardiopulmonary complications, and nerve injury. At his request the operation has been scheduled for Thursday, September 18.     [name], M.D. [facility] [place] Office: [phone] Pager:  [phone]

## 2013-06-02 NOTE — Preoperative (Signed)
Beta Blockers   Reason not to administer Beta Blockers:Not Applicable 

## 2013-06-02 NOTE — Progress Notes (Signed)
Utilization review completed.  

## 2013-06-02 NOTE — Anesthesia Postprocedure Evaluation (Signed)
  Anesthesia Post-op Note  Patient: Albert Powell  Procedure(s) Performed: Procedure(s): ENDARTERECTOMY CAROTID-LEFT (Left) PATCH ANGIOPLASTY (Left)  Patient Location: PACU  Anesthesia Type:General  Level of Consciousness: awake and alert   Airway and Oxygen Therapy: Patient Spontanous Breathing  Post-op Pain: mild  Post-op Assessment: Post-op Vital signs reviewed  Post-op Vital Signs: stable  Complications: No apparent anesthesia complications

## 2013-06-02 NOTE — Interval H&P Note (Signed)
History and Physical Interval Note:  06/02/2013 7:08 AM  Albert Powell  has presented today for surgery, with the diagnosis of Left Internal Carotid Artery Stenosis   The various methods of treatment have been discussed with the patient and family. After consideration of risks, benefits and other options for treatment, the patient has consented to  Procedure(s): ENDARTERECTOMY CAROTID-LEFT (Left) as a surgical intervention .  The patient's history has been reviewed, patient examined, no change in status, stable for surgery.  I have reviewed the patient's chart and labs.  Questions were answered to the patient's satisfaction.     Lydell Moga IV, V. WELLS

## 2013-06-02 NOTE — Op Note (Signed)
[facility] [place]  Patient name: Albert Powell MRN: [id number] DOB: 09/21/1940 Sex: male  06/02/2013 Pre-operative Diagnosis: Asx   left carotid stenosis Post-operative diagnosis:  Same Surgeon:  [name] Assistants:  S. Rhyne Procedure:    left carotid Endarterectomy with bovine pericardial patch angioplasty Anesthesia:  General Blood Loss:  See anesthesia record Specimens:  Carotid Plaque to pathology  Findings:  95 %stenosis; Thrombus:  none  Indications:  The patient was found to have asymptomatic left carotid stenosis on duplex ultrasound. This was confirmed via angiography. He remains asymptomatic.  Procedure:  The patient was identified in the holding area and taken to [facility] OR ROOM 11  The patient was then placed supine on the table.   General endotrachial anesthesia was administered.  The patient was prepped and draped in the usual sterile fashion.  A time out was called and antibiotics were administered.  The incision was made along the anterior border of the left sternocleidomastoid muscle.  Cautery was used to dissect through the subcutaneous tissue.  The platysma muscle was divided with cautery.  The internal jugular vein was exposed along its anterior medial border.  The common facial vein was exposed and then divided between 2-0 silk ties and metal clips.  The common carotid artery was then circumferentially exposed and encircled with an umbilical tape.  The vagus nerve was identified and protected.  Next sharp dissection was used to expose the external carotid artery and the superior thyroid artery.  The were encircled with a blue vessel loop and a 2-0 silk tie respectively.  Finally, the internal carotid was carefully dissected free.  An umbilical tape was placed around the internal carotid artery distal to the diseased segment.  The hypoglossal nerve was visualized throughout and protected.  The patient was given systemic heparinization.  A  bovine carotid patch was selected and prepared on the back table.  A 10 french shunt was also prepared.  After blood pressure readings were appropriate and the heparin had been given time to circulate, the internal carotid artery was occluded with a baby Gregory clamp.  The external and common carotid arteries were then occluded with vascular clamps and the 2-0 tie tightened on the superior thyroid artery.  A #11 blade was used to make an arteriotomy in the common carotid artery.  This was extended with Potts scissors along the anterior and lateral border of the common and internal carotid artery.  Approximately 95% stenosis was identified.  There was no thrombus identified.  The 10 french shunt was not placed because of the location of the lesion, and excellent backbleeding.  A kleiner kuntz elevator was used to perform endarterectomy.  An eversion endarterectomy was performed in the external carotid artery.  A good distal endpoint was obtained in the internal carotid artery.  The specimen was removed and sent to pathology.  Heparinized saline was used to irrigate the endarterectomized field.  All potential embolic debris was removed. I closed a portion of the common carotid artery primarily with a running 6-0 Prolene. Bovine pericardial patch angioplasty was then performed using a running 6-0 Prolene.The common internal and external carotid arteries were all appropriately flushed. The artery was again irrigated with heparin saline.  The anastomosis was then secured. The clamp was first released on the external carotid artery followed by the common carotid artery approximately 30 seconds later, bloodflow was reestablish through the internal carotid artery.  Next, a hand-held  Doppler was used to evaluate  the signals in the common, external, and internal  carotid arteries, all of which had appropriate signals. I then administered  50 mg protamine. The wound was then irrigated.  After hemostasis was achieved, the  carotid sheath was reapproximated with 3-0 Vicryl. The  platysma muscle was reapproximated with running 3-0 Vicryl. The skin  was closed with 4-0 Vicryl. Dermabond was placed on the skin. The  patient was then successfully extubated. His neurologic exam was  similar to his preprocedural exam. The patient was then taken to recovery room  in stable condition. There were no complications.     Disposition:  To PACU in stable condition.  Relevant Operative Details:  Extensive plaque extending from the proximal common carotid artery well into the internal carotid artery. I placed 2 tacking sutures in the distal internal carotid artery. I endarterectomized very proximally. I closed the common carotid artery primarily up to the origin of the external carotid artery. The remaining portion of the artery was closed with a bovine pericardial patch. No shunt was placed. The hypoglossal nerve was visualized and protected throughout the procedure.  Juleen China, M.D. [facility] [place] Office: [phone] Pager:  [phone]

## 2013-06-03 ENCOUNTER — Telehealth: Payer: Self-pay | Admitting: Surgery

## 2013-06-03 LAB — BASIC METABOLIC PANEL
Calcium: 8.7 mg/dL (ref 8.4–10.5)
Creatinine, Ser: 0.88 mg/dL (ref 0.50–1.35)
GFR calc non Af Amer: 84 mL/min — ABNORMAL LOW (ref >90)
Glucose, Bld: 83 mg/dL (ref 70–99)
Sodium: 141 mEq/L (ref 135–145)

## 2013-06-03 LAB — CBC
Hemoglobin: 11.9 g/dL — ABNORMAL LOW (ref 13.0–17.0)
MCH: 31.2 pg (ref 26.0–34.0)
MCHC: 34.9 g/dL (ref 30.0–36.0)

## 2013-06-03 LAB — GLUCOSE, CAPILLARY: Glucose-Capillary: 102 mg/dL — ABNORMAL HIGH (ref 70–99)

## 2013-06-03 NOTE — Progress Notes (Signed)
Complaining of increased sore throat. States: "feels like someone pushing against a bruise on the left side of my throat every time I swallow." Tongue midline, no evidence of tracheal deviation, maintaining SpO2 on 2 liters nasal cannula, neuro intact. Incision swollen since PACU, but new appreciation of small hardened area at the top, RN marked area. Dr. Imogene Burn notified. No new orders received. Will continue to monitor.   Rochele Pages, RN

## 2013-06-03 NOTE — Progress Notes (Signed)
Chaplain responded to consult for advanced directive. Patient was recovering from surgery and said he was having trouble thinking straight. Chaplain explained what the AD entails and told him to take it home with him and discuss it with his wife. Chaplain also explained how to make the document legally binding.

## 2013-06-03 NOTE — Progress Notes (Signed)
Patient and wife was given D/C instructions. They stated that they understood the instructions and patient signed paper work. Both IV's removed. Patient taken off monitor. All belongs packed up and given to wife.

## 2013-06-03 NOTE — Telephone Encounter (Signed)
Message copied by Jena Gauss on Fri Jun 03, 2013  9:35 AM ------      Message from: [place], [place] K      Created: Fri Jun 03, 2013  8:14 AM      Regarding: schedule                   ----- Message -----         From: Dara Lords, PA-C         Sent: 06/02/2013  10:52 AM           To: Sharee Pimple, CMA            S/p left CEA 06/02/13.  F/u with Dr. [name] in 2 weeks.            Thanks,      Lelon Mast ------

## 2013-06-03 NOTE — Progress Notes (Addendum)
[facility] Progress Note  06/03/2013 7:39 AM 1 Day Post-Op  Subjective:  Only c/o sore throat  Tm 100 now afebrile HR 50's-80's regular 110's-160's systolic 98% 2LO2NC  Filed Vitals:   06/03/13 0735  BP: 164/69  Pulse: 67  Temp:   Resp: 13     Physical Exam: Neuro:  In tact; pt with sore throat, but no difficulty swallowing Incision:  Small hematoma at the distal portion of the incision, otherwise, c/d/i   CBC    Component Value Date/Time   WBC 8.9 06/03/2013 0443   RBC 3.82* 06/03/2013 0443   HGB 11.9* 06/03/2013 0443   HCT 34.1* 06/03/2013 0443   PLT 164 06/03/2013 0443   MCV 89.3 06/03/2013 0443   MCH 31.2 06/03/2013 0443   MCHC 34.9 06/03/2013 0443   RDW 13.9 06/03/2013 0443   LYMPHSABS 3.1 02/05/2013 0523   MONOABS 0.7 02/05/2013 0523   EOSABS 0.3 02/05/2013 0523   BASOSABS 0.1 02/05/2013 0523    BMET    Component Value Date/Time   NA 141 06/03/2013 0443   K 3.7 06/03/2013 0443   CL 105 06/03/2013 0443   CO2 28 06/03/2013 0443   GLUCOSE 83 06/03/2013 0443   BUN 12 06/03/2013 0443   CREATININE 0.88 06/03/2013 0443   CREATININE 1.01 04/19/2013 1344   CALCIUM 8.7 06/03/2013 0443   GFRNONAA 84* 06/03/2013 0443   GFRAA >90 06/03/2013 0443     Intake/Output Summary (Last 24 hours) at 06/03/13 0739 Last data filed at 06/03/13 0700  Gross per 24 hour  Intake 9080.4 ml  Output   2000 ml  Net 7080.4 ml      Assessment/Plan:  This is a 72 y.o. male who is s/p left CEA 1 Day Post-Op  -pt is doing well this am. -pt neuro exam is in tact -pt has ambulated -per pt, he did require an I&O cath last night, but has been voiding well since then. -acute surgical blood loss anemia-tolerating -discharge pt later this am after breakfast.  Follow up with Dr. [name] in 2 weeks.   Doreatha Massed, PA-C [facility] [phone]

## 2013-06-03 NOTE — Discharge Summary (Signed)
[facility] Discharge Summary  Albert Powell [birth year] 72 y.o. male  [id number]  Admission Date: 06/02/2013  Discharge Date: 06/03/13  Physician: Nada Libman, MD  Admission Diagnosis: Left Internal Carotid Artery Stenosis    HPI:   This is a 72 y.o. male who is referred for evaluation of asymptomatic left carotid stenosis. This was detected on duplex ultrasound and confirmed via angiography. The patient denies having any symptoms. He denies numbness or weakness in either extremity. He denies slurred speech. He denies amaurosis fugax.  Patient has a history of coronary artery disease. He has undergone stenting secondary to angina. He is currently on aspirin for antiplatelet therapy. He is on 2 medications for blood pressure. He is on a statin 4 hypercholesterolemia. He is a diet controlled diabetic. He reports blood sugars in the 90-105 range. He is a former smoker.  The patient does report pain in his right hip and thigh with walking less than a block. He used to have cramping in his right calf however he has a TENS unit which Has improved this pain.  Hospital Course:  The patient was admitted to the hospital and taken to the operating room on 06/02/2013 and underwent left carotid endarterectomy.  The pt tolerated the procedure well and was transported to the PACU in good condition.  The night of surgery, he did require I&O cath, but has been voiding without difficulty since then.   By POD 1, the pt neuro status his neuro status is in tact and he is doing well.  He only complains of a sore throat, but does not have difficulty swallowing.  The remainder of the hospital course consisted of increasing mobilization and increasing intake of solids without difficulty.    Recent Labs  05/31/13 1451 06/03/13 0443  NA 135 141  K 4.1 3.7  CL 97 105  CO2 27 28  GLUCOSE 141* 83  BUN 15 12  CALCIUM 9.6 8.7    Recent Labs  06/02/13 1158 06/03/13 0443  WBC  9.3 8.9  HGB 11.1* 11.9*  HCT 31.7* 34.1*  PLT 149* 164    Recent Labs  05/31/13 1451  INR 0.93    Discharge Instructions:   The patient is discharged to home with extensive instructions on wound care and progressive ambulation.  They are instructed not to drive or perform any heavy lifting until returning to see the physician in his office.  Discharge Orders   Future Orders Complete By Expires   Call MD for:  redness, tenderness, or signs of infection (pain, swelling, bleeding, redness, odor or green/yellow discharge around incision site)  As directed    Call MD for:  severe or increased pain, loss or decreased feeling  in affected limb(s)  As directed    Call MD for:  temperature >100.5  As directed    CAROTID Sugery: Call MD for difficulty swallowing or speaking; weakness in arms or legs that is a new symtom; severe headache.  If you have increased swelling in the neck and/or  are having difficulty breathing, CALL 911  As directed    Discharge wound care:  As directed    Comments:     Shower daily with soap and water starting 06/04/13   Driving Restrictions  As directed    Comments:     No driving for 2 weeks   Lifting restrictions  As directed    Comments:     No lifting for 2 weeks   Resume previous  diet  As directed       Discharge Diagnosis:  Left Internal Carotid Artery Stenosis   Secondary Diagnosis: Patient Active Problem List   Diagnosis Date Noted  . Occlusion and stenosis of carotid artery without mention of cerebral infarction 05/23/2013  . Peripheral arterial disease 05/18/2013  . Carotid artery disease 04/14/2013  . Bradycardia, sinus 02/18/2013  . Hyperlipidemia 02/05/2013  . Tobacco abuse 02/05/2013  . Chest pain, unstable angina, negative MI 02/04/2013  . HTN (hypertension) 02/04/2013  . DM (diabetes mellitus) 02/04/2013  . CAD (coronary artery disease), hx of 2 stents to RCA in 2003, residual disease at that time of 40% LAD, now 80-90% stenosis of  LAD 02/04/2013  . Incisional hernia 05/22/2011   Past Medical History  Diagnosis Date  . Onychomycosis   . GERD (gastroesophageal reflux disease)   . History of colonoscopy 2004    finding of tics and AMV only no polpys  . DDD (degenerative disc disease) 2008  . Cataract   . Diabetes mellitus   . Rosacea   . Hypertension   . COPD (chronic obstructive pulmonary disease)   . Hyperlipidemia 02/05/2013  . Tobacco abuse 02/05/2013  . Claudication     LEA DUPLEX, 07/07/2008 - Normal  . CAD (coronary artery disease)     stent to RCA 2003 also had 40% lesion at that time; NUCLEAR STRESS TEST, 01/16/2010 - normal  now with cath 80-90% stenosis in LAD, will try medical therapy if no improvement PCI  . Bradycardia, sinus 02/18/2013  . Carotid artery disease   . H/O hiatal hernia       Medication List         amLODipine 10 MG tablet  Commonly known as:  NORVASC  Take 1 tablet by mouth every evening.     aspirin EC 81 MG tablet  Take 81 mg by mouth daily.     atorvastatin 40 MG tablet  Commonly known as:  LIPITOR  Take 40 mg by mouth daily.     citalopram 20 MG tablet  Commonly known as:  CELEXA  Take 20 mg by mouth daily.     Fluticasone-Salmeterol 100-50 MCG/DOSE Aepb  Commonly known as:  ADVAIR  Inhale 1 puff into the lungs 2 (two) times daily as needed (for shortness of breath).     glucose blood test strip  1 each by Other route as needed. Use as instructed     glucose monitoring kit monitoring kit  1 each by Does not apply route as needed.     isosorbide mononitrate 60 MG 24 hr tablet  Commonly known as:  IMDUR  Take 1 tablet (60 mg total) by mouth daily.     losartan-hydrochlorothiazide 100-25 MG per tablet  Commonly known as:  HYZAAR  Take 1 tablet by mouth every evening.     metoprolol succinate 25 MG 24 hr tablet  Commonly known as:  TOPROL-XL  Take 12.5 mg by mouth daily.     metroNIDAZOLE 0.75 % cream  Commonly known as:  METROCREAM  Apply 1 application  topically 2 (two) times daily.     mupirocin ointment 2 %  Commonly known as:  BACTROBAN  With a q-tip apply to both nasal areas three times daily.     nitroGLYCERIN 0.4 MG SL tablet  Commonly known as:  NITROSTAT  Place 1 tablet (0.4 mg total) under the tongue every 5 (five) minutes as needed for chest pain.     omeprazole 20 MG capsule  Commonly known as:  PRILOSEC  Take 20 mg by mouth daily.     oxyCODONE 5 MG immediate release tablet  Commonly known as:  ROXICODONE  Take 1 tablet (5 mg total) by mouth every 6 (six) hours as needed for pain.        Roxicodone #30 No Refill  Disposition: home  Patient's condition: is Good  Follow up: 1. Dr.  [name] in 2 weeks.   Doreatha Massed, PA-C [facility] [phone]  --- For [facility] use --- Instructions: Press F2 to tab through selections.  Delete question if not applicable.   Modified Rankin score at D/C (0-6): 0  IV medication needed for:  1. Hypertension: No 2. Hypotension: Yes-dopamine  Post-op Complications: No  1. Post-op CVA or TIA: No  If yes: Event classification (right eye, left eye, right cortical, left cortical, verterobasilar, other): n/a  If yes: Timing of event (intra-op, <6 hrs post-op, >=6 hrs post-op, unknown): n/a  2. CN injury: No  If yes: CN n/a injuried   3. Myocardial infarction: No  If yes: Dx by (EKG or clinical, Troponin): n/a  4.  CHF: No  5.  Dysrhythmia (new): No  6. Wound infection: No  7. Reperfusion symptoms: No  8. Return to OR: No  If yes: return to OR for (bleeding, neurologic, other CEA incision, other): n/a  Discharge medications: Statin use:  Yes If No: [ ]  For Medical reasons, [ ]  Non-compliant, [ ]  Not-indicated ASA use:  Yes  If No: [ ]  For Medical reasons, [ ]  Non-compliant, [ ]  Not-indicated Beta blocker use:  Yes If No: [ ]  For Medical reasons, [ ]  Non-compliant, [ ]  Not-indicated ACE-Inhibitor use:  No-he is on an ARB If No: [ ]   For Medical reasons, [ ]  Non-compliant, [ ]  Not-indicated P2Y12 Antagonist use: no, [ ]  Plavix, [ ]  Plasugrel, [ ]  Ticlopinine, [ ]  Ticagrelor, [ ]  Other, [ ]  No for medical reason, [ ]  Non-compliant, [ ]  Not-indicated Anti-coagulant use:  no, [ ]  Warfarin, [ ]  Rivaroxaban, [ ]  Dabigatran, [ ]  Other, [ ]  No for medical reason, [ ]  Non-compliant, [ ]  Not-indicated

## 2013-06-04 ENCOUNTER — Inpatient Hospital Stay (HOSPITAL_COMMUNITY)
Admission: EM | Admit: 2013-06-04 | Discharge: 2013-06-05 | DRG: 921 | Disposition: A | Discharge status: Home / Self Care | Payer: Medicare Other | Attending: Vascular Surgery | Admitting: Vascular Surgery

## 2013-06-04 ENCOUNTER — Encounter (HOSPITAL_COMMUNITY): Payer: Self-pay | Admitting: *Deleted

## 2013-06-04 DIAGNOSIS — J449 Chronic obstructive pulmonary disease, unspecified: Secondary | ICD-10-CM | POA: Diagnosis present

## 2013-06-04 DIAGNOSIS — K219 Gastro-esophageal reflux disease without esophagitis: Secondary | ICD-10-CM | POA: Diagnosis present

## 2013-06-04 DIAGNOSIS — Z87891 Personal history of nicotine dependence: Secondary | ICD-10-CM | POA: Diagnosis not present

## 2013-06-04 DIAGNOSIS — E119 Type 2 diabetes mellitus without complications: Secondary | ICD-10-CM | POA: Diagnosis present

## 2013-06-04 DIAGNOSIS — T888XXA Other specified complications of surgical and medical care, not elsewhere classified, initial encounter: Secondary | ICD-10-CM

## 2013-06-04 DIAGNOSIS — I2510 Atherosclerotic heart disease of native coronary artery without angina pectoris: Secondary | ICD-10-CM | POA: Diagnosis present

## 2013-06-04 DIAGNOSIS — IMO0002 Reserved for concepts with insufficient information to code with codable children: Secondary | ICD-10-CM | POA: Diagnosis not present

## 2013-06-04 DIAGNOSIS — J4489 Other specified chronic obstructive pulmonary disease: Secondary | ICD-10-CM | POA: Diagnosis present

## 2013-06-04 DIAGNOSIS — Y838 Other surgical procedures as the cause of abnormal reaction of the patient, or of later complication, without mention of misadventure at the time of the procedure: Secondary | ICD-10-CM | POA: Diagnosis present

## 2013-06-04 DIAGNOSIS — E785 Hyperlipidemia, unspecified: Secondary | ICD-10-CM | POA: Diagnosis present

## 2013-06-04 DIAGNOSIS — I10 Essential (primary) hypertension: Secondary | ICD-10-CM | POA: Diagnosis present

## 2013-06-04 DIAGNOSIS — R1310 Dysphagia, unspecified: Secondary | ICD-10-CM | POA: Diagnosis not present

## 2013-06-04 LAB — BASIC METABOLIC PANEL
BUN: 10 mg/dL (ref 6–23)
Chloride: 94 mEq/L — ABNORMAL LOW (ref 96–112)
GFR calc Af Amer: >90 mL/min (ref >90)
Potassium: 4.4 mEq/L (ref 3.5–5.1)
Sodium: 134 mEq/L — ABNORMAL LOW (ref 135–145)

## 2013-06-04 LAB — CBC WITH DIFFERENTIAL/PLATELET
HCT: 39.6 % (ref 39.0–52.0)
Hemoglobin: 14 g/dL (ref 13.0–17.0)
Lymphocytes Relative: 12 % (ref 12–46)
Lymphs Abs: 1.3 10*3/uL (ref 0.7–4.0)
Monocytes Absolute: 0.9 10*3/uL (ref 0.1–1.0)
Monocytes Relative: 8 % (ref 3–12)
Neutro Abs: 8.3 10*3/uL — ABNORMAL HIGH (ref 1.7–7.7)
Neutrophils Relative %: 79 % — ABNORMAL HIGH (ref 43–77)
RBC: 4.42 MIL/uL (ref 4.22–5.81)
WBC: 10.6 10*3/uL — ABNORMAL HIGH (ref 4.0–10.5)

## 2013-06-04 LAB — PROTIME-INR: INR: 0.99 (ref 0.00–1.49)

## 2013-06-04 MED ORDER — ASPIRIN EC 81 MG PO TBEC
81.0000 mg | DELAYED_RELEASE_TABLET | Freq: Every day | ORAL | Status: DC
  Administered 2013-06-04: 81 mg via ORAL
  Filled 2013-06-04 (×2): qty 1

## 2013-06-04 MED ORDER — POTASSIUM CHLORIDE CRYS ER 20 MEQ PO TBCR
20.0000 meq | EXTENDED_RELEASE_TABLET | Freq: Once | ORAL | Status: DC
  Filled 2013-06-04: qty 2

## 2013-06-04 MED ORDER — ATORVASTATIN CALCIUM 40 MG PO TABS
40.0000 mg | ORAL_TABLET | Freq: Every day | ORAL | Status: DC
  Administered 2013-06-04: 40 mg via ORAL
  Filled 2013-06-04 (×2): qty 1

## 2013-06-04 MED ORDER — NON FORMULARY: 1.0000 | Freq: Every morning | Status: DC

## 2013-06-04 MED ORDER — ISOSORBIDE MONONITRATE ER 60 MG PO TB24
60.0000 mg | ORAL_TABLET | Freq: Every day | ORAL | Status: DC
  Administered 2013-06-04: 60 mg via ORAL
  Filled 2013-06-04 (×2): qty 1

## 2013-06-04 MED ORDER — CITALOPRAM HYDROBROMIDE 20 MG PO TABS
20.0000 mg | ORAL_TABLET | Freq: Every day | ORAL | Status: DC
Start: 2013-06-04 — End: 2013-06-05
  Filled 2013-06-04: qty 1

## 2013-06-04 MED ORDER — HYDROCHLOROTHIAZIDE 25 MG PO TABS
25.0000 mg | ORAL_TABLET | Freq: Every evening | ORAL | Status: DC
  Administered 2013-06-04: 25 mg via ORAL
  Filled 2013-06-04 (×2): qty 1

## 2013-06-04 MED ORDER — SODIUM CHLORIDE 0.9 % IV SOLN: 250.0000 mL | INTRAVENOUS | Status: DC | PRN

## 2013-06-04 MED ORDER — METOPROLOL TARTRATE 1 MG/ML IV SOLN: 2.0000 mg | INTRAVENOUS | Status: DC | PRN

## 2013-06-04 MED ORDER — SODIUM CHLORIDE 0.9 % IJ SOLN
3.0000 mL | Freq: Two times a day (BID) | INTRAMUSCULAR | Status: DC
  Administered 2013-06-04: 3 mL via INTRAVENOUS

## 2013-06-04 MED ORDER — LABETALOL HCL 5 MG/ML IV SOLN
10.0000 mg | INTRAVENOUS | Status: DC | PRN
  Filled 2013-06-04: qty 4

## 2013-06-04 MED ORDER — ACETAMINOPHEN 325 MG PO TABS: 325.0000 mg | ORAL_TABLET | ORAL | Status: DC | PRN

## 2013-06-04 MED ORDER — METRONIDAZOLE 0.75 % EX CREA
TOPICAL_CREAM | Freq: Two times a day (BID) | CUTANEOUS | Status: DC
  Administered 2013-06-04: 16:00:00 via TOPICAL
  Filled 2013-06-04 (×18): qty 1

## 2013-06-04 MED ORDER — ACETAMINOPHEN 650 MG RE SUPP: 325.0000 mg | RECTAL | Status: DC | PRN

## 2013-06-04 MED ORDER — ENSURE PO LIQD: 237.0000 mL | Freq: Three times a day (TID) | ORAL | Status: DC

## 2013-06-04 MED ORDER — INSULIN ASPART 100 UNIT/ML ~~LOC~~ SOLN
0.0000 [IU] | Freq: Three times a day (TID) | SUBCUTANEOUS | Status: DC
  Administered 2013-06-04: 3 [IU] via SUBCUTANEOUS

## 2013-06-04 MED ORDER — ENSURE COMPLETE PO LIQD
237.0000 mL | Freq: Three times a day (TID) | ORAL | Status: DC
  Administered 2013-06-04: 237 mL via ORAL

## 2013-06-04 MED ORDER — PHENOL 1.4 % MT LIQD
1.0000 | OROMUCOSAL | Status: DC | PRN
  Filled 2013-06-04: qty 177

## 2013-06-04 MED ORDER — NITROGLYCERIN 0.4 MG SL SUBL: 0.4000 mg | SUBLINGUAL_TABLET | SUBLINGUAL | Status: DC | PRN

## 2013-06-04 MED ORDER — AMLODIPINE BESYLATE 10 MG PO TABS
10.0000 mg | ORAL_TABLET | Freq: Every evening | ORAL | Status: DC
  Administered 2013-06-04: 10 mg via ORAL
  Filled 2013-06-04 (×2): qty 1

## 2013-06-04 MED ORDER — ALUM & MAG HYDROXIDE-SIMETH 200-200-20 MG/5ML PO SUSP: 15.0000 mL | ORAL | Status: DC | PRN

## 2013-06-04 MED ORDER — HYDRALAZINE HCL 20 MG/ML IJ SOLN: 10.0000 mg | INTRAMUSCULAR | Status: DC | PRN

## 2013-06-04 MED ORDER — METRONIDAZOLE 0.75 % EX CREA: 1.0000 "application " | TOPICAL_CREAM | Freq: Two times a day (BID) | CUTANEOUS | Status: DC

## 2013-06-04 MED ORDER — LOSARTAN POTASSIUM 50 MG PO TABS
100.0000 mg | ORAL_TABLET | Freq: Every evening | ORAL | Status: DC
  Administered 2013-06-04: 100 mg via ORAL
  Filled 2013-06-04 (×2): qty 2

## 2013-06-04 MED ORDER — FLUTICASONE-SALMETEROL 100-50 MCG/DOSE IN AEPB
2.0000 | INHALATION_SPRAY | Freq: Two times a day (BID) | RESPIRATORY_TRACT | Status: DC
  Administered 2013-06-04: 2 via RESPIRATORY_TRACT
  Filled 2013-06-04: qty 14

## 2013-06-04 MED ORDER — OMEPRAZOLE 20 MG PO CPDR
20.0000 mg | DELAYED_RELEASE_CAPSULE | Freq: Every day | ORAL | Status: DC
  Administered 2013-06-04: 20 mg via ORAL
  Filled 2013-06-04 (×2): qty 1

## 2013-06-04 MED ORDER — ONDANSETRON HCL 4 MG/2ML IJ SOLN: 4.0000 mg | Freq: Four times a day (QID) | INTRAMUSCULAR | Status: DC | PRN

## 2013-06-04 MED ORDER — METOPROLOL SUCCINATE 12.5 MG HALF TABLET
12.5000 mg | ORAL_TABLET | Freq: Every day | ORAL | Status: DC
  Administered 2013-06-04: 12.5 mg via ORAL
  Filled 2013-06-04 (×2): qty 1

## 2013-06-04 MED ORDER — LOSARTAN POTASSIUM-HCTZ 100-25 MG PO TABS: 1.0000 | ORAL_TABLET | Freq: Every evening | ORAL | Status: DC

## 2013-06-04 MED ORDER — NON FORMULARY: 2.0000 | Freq: Two times a day (BID) | Status: DC

## 2013-06-04 MED ORDER — GUAIFENESIN-DM 100-10 MG/5ML PO SYRP: 15.0000 mL | ORAL_SOLUTION | ORAL | Status: DC | PRN

## 2013-06-04 MED ORDER — OXYCODONE HCL 5 MG PO TABS
5.0000 mg | ORAL_TABLET | ORAL | Status: DC | PRN
  Administered 2013-06-04: 10 mg via ORAL
  Filled 2013-06-04: qty 2

## 2013-06-04 MED ORDER — SODIUM CHLORIDE 0.9 % IJ SOLN: 3.0000 mL | INTRAMUSCULAR | Status: DC | PRN

## 2013-06-04 MED ORDER — METRONIDAZOLE 0.75 % EX GEL
Freq: Two times a day (BID) | CUTANEOUS | Status: DC
  Administered 2013-06-04: 18:00:00 via TOPICAL
  Filled 2013-06-04: qty 45

## 2013-06-04 NOTE — H&P (Signed)
[facility]  HISTORY AND PHYSICAL   History of Present Illness:  Called to see patient for left neck swelling.Uneventful left CEA Dr [name] 06/02/13.  Pt states he feels bad.  No dyspnea.  Some hoarseness. Some difficulty swallowing solids but not thin liquids.  No chest pain. No syncope. No weakness or numbness upper or lower extremities.  Past Medical History  Diagnosis Date  . Onychomycosis   . GERD (gastroesophageal reflux disease)   . History of colonoscopy 2004    finding of tics and AMV only no polpys  . DDD (degenerative disc disease) 2008  . Cataract   . Diabetes mellitus   . Rosacea   . Hypertension   . COPD (chronic obstructive pulmonary disease)   . Hyperlipidemia 02/05/2013  . Tobacco abuse 02/05/2013  . Claudication     LEA DUPLEX, 07/07/2008 - Normal  . CAD (coronary artery disease)     stent to RCA 2003 also had 40% lesion at that time; NUCLEAR STRESS TEST, 01/16/2010 - normal  now with cath 80-90% stenosis in LAD, will try medical therapy if no improvement PCI  . Bradycardia, sinus 02/18/2013  . Carotid artery disease   . H/O hiatal hernia     Past Surgical History  Procedure Laterality Date  . Colon surgery  2005    renal pouch rectal anastimosis  . Cardiac surgery      stents  2003  . Coronary angioplasty with stent placement  09/2002    2 stents to RCA  . Cardiac catheterization  08/29/2002    2-vessel CAD with high-grade stenoses in RCA; stenting to RCA  . Cardiac catheterization  2014    80-90% lesion will try medical therapy  . Colon resection  3/04, 6/04     1 bleeding diverticulitis, 2 complete colectomy  . Appendectomy    . Tonsillectomy    . Carotid endarterectomy       Social History History  Substance Use Topics  . Smoking status: Former Smoker -- 0.75 packs/day for 57 years    Types: Cigarettes    Quit date: 04/06/2013  . Smokeless tobacco: Former Neurosurgeon    Quit date: 04/14/2013     Comment: pt states that he  is using the vapor cigs  . Alcohol Use: 0.6 oz/week    1 Glasses of wine per week    Family History Family History  Problem Relation Age of Onset  . Heart disease Father     heart attack at 23  . Heart attack Father   . Hyperlipidemia Father   . Colonic polyp Mother     that bled out  . COPD Mother   . Diabetes Mother   . Heart disease Sister   . Colonic polyp Sister   . Stroke Maternal Grandfather   . Heart attack Paternal Grandfather     Allergies  Allergies  Allergen Reactions  . Fentanyl Other (See Comments)    Behavioral changes  . Gabapentin     ankle swells  . Lisinopril     unknown  . Lyrica [Pregabalin]     unknown  . Other Other (See Comments)    Flush, rash with scallops, shrimp. Recent with shrimp     Current Facility-Administered Medications  Medication Dose Route Frequency Provider Last Rate Last Dose  . amLODipine (NORVASC) tablet 10 mg  10 mg Oral QPM Sherren Kerns, MD      . aspirin EC tablet 81 mg  81 mg Oral  Daily Sherren Kerns, MD      . atorvastatin (LIPITOR) tablet 40 mg  40 mg Oral Daily Sherren Kerns, MD      . citalopram (CELEXA) tablet 20 mg  20 mg Oral Daily Sherren Kerns, MD      . isosorbide mononitrate (IMDUR) 24 hr tablet 60 mg  60 mg Oral Daily Sherren Kerns, MD      . losartan-hydrochlorothiazide (HYZAAR) 100-25 MG per tablet 1 tablet  1 tablet Oral QPM Sherren Kerns, MD      . metoprolol succinate (TOPROL-XL) 24 hr tablet 12.5 mg  12.5 mg Oral Daily Sherren Kerns, MD      . metroNIDAZOLE (METROCREAM) 0.75 % cream 1 application  1 application Topical BID Sherren Kerns, MD      . nitroGLYCERIN (NITROSTAT) SL tablet 0.4 mg  0.4 mg Sublingual Q5 min PRN Sherren Kerns, MD      . NON FORMULARY 1 tablet  1 tablet Topical q morning - 10a Sherren Kerns, MD      . NON FORMULARY 2 puff  2 puff Inhalation BID Sherren Kerns, MD       Current Outpatient Prescriptions  Medication Sig Dispense Refill  .  amLODipine (NORVASC) 10 MG tablet Take 10 mg by mouth every evening.       [name] aspirin EC 81 MG tablet Take 81 mg by mouth daily.      [name] atorvastatin (LIPITOR) 40 MG tablet Take 40 mg by mouth daily.      . citalopram (CELEXA) 20 MG tablet Take 20 mg by mouth daily.      . Fluticasone-Salmeterol (ADVAIR) 100-50 MCG/DOSE AEPB Inhale 1 puff into the lungs 2 (two) times daily as needed (for shortness of breath).       . isosorbide mononitrate (IMDUR) 60 MG 24 hr tablet Take 1 tablet (60 mg total) by mouth daily.      [name] losartan-hydrochlorothiazide (HYZAAR) 100-25 MG per tablet Take 1 tablet by mouth every evening.      . metoprolol succinate (TOPROL-XL) 25 MG 24 hr tablet Take 12.5 mg by mouth daily.      . metroNIDAZOLE (METROCREAM) 0.75 % cream Apply 1 application topically 2 (two) times daily.       . nitroGLYCERIN (NITROSTAT) 0.4 MG SL tablet Place 0.4 mg under the tongue every 5 (five) minutes as needed for chest pain.      [name] omeprazole (PRILOSEC) 20 MG capsule Take 20 mg by mouth daily.       [name] oxyCODONE (ROXICODONE) 5 MG immediate release tablet Take 1 tablet (5 mg total) by mouth every 6 (six) hours as needed for pain.  30 tablet  0  . glucose blood test strip 1 each by Other route as needed. Use as instructed       . glucose monitoring kit (FREESTYLE) monitoring kit 1 each by Does not apply route as needed.          ROS:   General:  No weight loss, Fever, chills  HEENT: No recent headaches, no nasal bleeding, no visual changes, + sore throat  Neurologic: No dizziness, blackouts, seizures. No recent symptoms of stroke or mini- stroke. No recent episodes of slurred speech, or temporary blindness.  Cardiac: No recent episodes of chest pain/pressure, no shortness of breath at rest.  No shortness of breath with exertion.  Denies history of atrial fibrillation or irregular heartbeat  Vascular: No history of rest  pain in feet.  No history of claudication.  No history of non-healing ulcer, No  history of DVT   Pulmonary: No home oxygen, no productive cough, no hemoptysis,  No asthma or wheezing  Musculoskeletal:  [ ]  Arthritis, [ ]  Low back pain,  [ ]  Joint pain  Hematologic:No history of hypercoagulable state.  No history of easy bleeding.  No history of anemia  Gastrointestinal: No hematochezia or melena,  No gastroesophageal reflux, + trouble swallowing  Urinary: [ ]  chronic Kidney disease, [ ]  on HD - [ ]  MWF or [ ]  TTHS, [ ]  Burning with urination, [ ]  Frequent urination, [ ]  Difficulty urinating;   Skin: No rashes  Psychological: No history of anxiety,  No history of depression   Physical Examination  Filed Vitals:   06/04/13 1126 06/04/13 1244  BP: 165/78 157/62  Pulse: 87 76  Temp: 99 F (37.2 C)   TempSrc: Oral   Resp: 20 14  SpO2: 96% 96%    There is no weight on file to calculate BMI.  General:  Alert and oriented, no acute distress HEENT: Normal Neck: No bruit or JVD, left neck incision healing, some asymmetry of neck due to edema but neck is soft overall and no tense focal hematoma,  Mild tenderness, large short neck Pulmonary: Clear to auscultation bilaterally Cardiac: Regular Rate and Rhythm  Abdomen: Soft, non-tender, non-distended, no mass Skin: No rash Extremity Pulses:  2+ radial, brachial bilaterally Musculoskeletal: No deformity or edema  Neurologic: Upper and lower extremity motor 5/5 and symmetric   ASSESSMENT: Mild left neck swelling with some mild dysphagia post left CEA   PLAN:  Will admit place on soft diet for now.  Cool liquids for swelling.  If symptoms worsen over the course of the day or overnight will consider I and D neck tomorrow.  NPO p midnight.  Fabienne Bruns, MD [facility] [place] Office: [phone] Pager: [phone]

## 2013-06-04 NOTE — ED Notes (Signed)
Patient presents to ed c/o pain and swelling to left side of his neck. States he had a  Procedure on Tues. States his voice is hoarse today c/o painful swallowing.

## 2013-06-04 NOTE — ED Notes (Signed)
PT had left CEA on Thursday and now with hoarse voice, problems swallowing, and problems breathing.  Pt states feels better when he lays back.  Symptoms started last nite
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
## 2013-06-04 NOTE — ED Notes (Signed)
Report Called to [employer] [id number]

## 2013-06-04 NOTE — ED Notes (Signed)
Pt reports increased swelling to throat.  No drooling

## 2013-06-04 NOTE — ED Notes (Signed)
Patient had BM in bathroom.

## 2013-06-04 NOTE — ED Provider Notes (Signed)
CSN: [id number]     Arrival date & time 06/04/13  1121 History   First MD Initiated Contact with Patient 06/04/13 1140     Chief Complaint  Patient presents with  . POST CEA/Problem swallowing     HPI  Albert Powell had a carotid endarterectomy a few days ago. He was discharged from the hospital yesterday. His last exam note he was noted to have a small hematoma at the anterior/distal aspect of his wound. He went home. He awakened this morning. Amount of swelling around his incision and lateral and central neck is greatly increased. He is become worse. He has no trouble breathing. He states it is uncomfortable to swallow but he is able to swallow. He is not drooling. He has no difficulty due to the extremities no TIA or stroke symptoms. No headache. No episodes of confusion.  Past Medical History  Diagnosis Date  . Onychomycosis   . GERD (gastroesophageal reflux disease)   . History of colonoscopy 2004    finding of tics and AMV only no polpys  . DDD (degenerative disc disease) 2008  . Cataract   . Diabetes mellitus   . Rosacea   . Hypertension   . COPD (chronic obstructive pulmonary disease)   . Hyperlipidemia 02/05/2013  . Tobacco abuse 02/05/2013  . Claudication     LEA DUPLEX, 07/07/2008 - Normal  . CAD (coronary artery disease)     stent to RCA 2003 also had 40% lesion at that time; NUCLEAR STRESS TEST, 01/16/2010 - normal  now with cath 80-90% stenosis in LAD, will try medical therapy if no improvement PCI  . Bradycardia, sinus 02/18/2013  . Carotid artery disease   . H/O hiatal hernia    Past Surgical History  Procedure Laterality Date  . Colon surgery  2005    renal pouch rectal anastimosis  . Cardiac surgery      stents  2003  . Coronary angioplasty with stent placement  09/2002    2 stents to RCA  . Cardiac catheterization  08/29/2002    2-vessel CAD with high-grade stenoses in RCA; stenting to RCA  . Cardiac catheterization  2014    80-90% lesion will try medical therapy   . Colon resection  3/04, 6/04     1 bleeding diverticulitis, 2 complete colectomy  . Appendectomy    . Tonsillectomy    . Carotid endarterectomy     Family History  Problem Relation Age of Onset  . Heart disease Father     heart attack at 77  . Heart attack Father   . Hyperlipidemia Father   . Colonic polyp Mother     that bled out  . COPD Mother   . Diabetes Mother   . Heart disease Sister   . Colonic polyp Sister   . Stroke Maternal Grandfather   . Heart attack Paternal Grandfather    History  Substance Use Topics  . Smoking status: Former Smoker -- 0.75 packs/day for 57 years    Types: Cigarettes    Quit date: 04/06/2013  . Smokeless tobacco: Former Neurosurgeon    Quit date: 04/14/2013     Comment: pt states that he is using the vapor cigs  . Alcohol Use: 0.6 oz/week    1 Glasses of wine per week    Review of Systems  Constitutional: Negative for fever, chills, diaphoresis, appetite change and fatigue.  HENT: Positive for neck pain. Negative for sore throat, mouth sores and trouble swallowing.  Soft tissue swelling to the left neck. Incision has not dehisced, drained, or bled.  Eyes: Negative for visual disturbance.  Respiratory: Negative for cough, chest tightness, shortness of breath and wheezing.   Cardiovascular: Negative for chest pain.  Gastrointestinal: Negative for nausea, vomiting, abdominal pain, diarrhea and abdominal distention.  Endocrine: Negative for polydipsia, polyphagia and polyuria.  Genitourinary: Negative for dysuria, frequency and hematuria.  Musculoskeletal: Negative for gait problem.  Skin: Negative for color change, pallor and rash.  Neurological: Negative for dizziness, syncope, light-headedness and headaches.  Hematological: Does not bruise/bleed easily.  Psychiatric/Behavioral: Negative for behavioral problems and confusion.    Allergies  Fentanyl; Gabapentin; Lisinopril; Lyrica; and Other  Home Medications   Current Outpatient Rx   Name  Route  Sig  Dispense  Refill  . amLODipine (NORVASC) 10 MG tablet   Oral   Take 10 mg by mouth every evening.          [name] aspirin EC 81 MG tablet   Oral   Take 81 mg by mouth daily.         [name] atorvastatin (LIPITOR) 40 MG tablet   Oral   Take 40 mg by mouth daily.         . citalopram (CELEXA) 20 MG tablet   Oral   Take 20 mg by mouth daily.         . Fluticasone-Salmeterol (ADVAIR) 100-50 MCG/DOSE AEPB   Inhalation   Inhale 1 puff into the lungs 2 (two) times daily as needed (for shortness of breath).          . isosorbide mononitrate (IMDUR) 60 MG 24 hr tablet   Oral   Take 1 tablet (60 mg total) by mouth daily.         [name] losartan-hydrochlorothiazide (HYZAAR) 100-25 MG per tablet   Oral   Take 1 tablet by mouth every evening.         . metoprolol succinate (TOPROL-XL) 25 MG 24 hr tablet   Oral   Take 12.5 mg by mouth daily.         . metroNIDAZOLE (METROCREAM) 0.75 % cream   Topical   Apply 1 application topically 2 (two) times daily.          . nitroGLYCERIN (NITROSTAT) 0.4 MG SL tablet   Sublingual   Place 0.4 mg under the tongue every 5 (five) minutes as needed for chest pain.         [name] omeprazole (PRILOSEC) 20 MG capsule   Oral   Take 20 mg by mouth daily.          [name] oxyCODONE (ROXICODONE) 5 MG immediate release tablet   Oral   Take 1 tablet (5 mg total) by mouth every 6 (six) hours as needed for pain.   30 tablet   0   . glucose blood test strip   Other   1 each by Other route as needed. Use as instructed          . glucose monitoring kit (FREESTYLE) monitoring kit   Does not apply   1 each by Does not apply route as needed.            BP 157/62  Pulse 76  Temp(Src) 99 F (37.2 C) (Oral)  Resp 14  SpO2 96% Physical Exam  Constitutional: He is oriented to person, place, and time. He appears well-developed and well-nourished. No distress.  HENT:  Head: Normocephalic.  Eyes: Conjunctivae are normal. Pupils are  equal, round, and reactive to light. No scleral icterus.  Neck: Normal range of motion. Neck supple. No thyromegaly present.  Soft tissue swelling consistent with hematoma underlying his left carotid endarterectomy incision. It extends to the midline and slightly past the midline of the neck left to right. He is not stridorous. He has a hoarse voice. He can lie supine without being apprehensive.  Cardiovascular: Normal rate and regular rhythm.  Exam reveals no gallop and no friction rub.   No murmur heard. Pulmonary/Chest: Effort normal and breath sounds normal. No respiratory distress. He has no wheezes. He has no rales.  Abdominal: Soft. Bowel sounds are normal. He exhibits no distension. There is no tenderness. There is no rebound.  Musculoskeletal: Normal range of motion.  Neurological: He is alert and oriented to person, place, and time.  Skin: Skin is warm and dry. No rash noted.  Psychiatric: He has a normal mood and affect. His behavior is normal.    ED Course  Procedures (including critical care time) Labs Review Labs Reviewed  CBC WITH DIFFERENTIAL - Abnormal; Notable for the following:    WBC 10.6 (*)    Neutrophils Relative % 79 (*)    Neutro Abs 8.3 (*)    All other components within normal limits  PROTIME-INR  BASIC METABOLIC PANEL   Imaging Review No results found.  MDM   1. Postoperative hematoma, initial encounter    I discussed the case with Dr. Darrick Penna. It revealed was actually already aware the patient and is in route. On my conversation with him here he is planning admission to the hospital for further observation. No immediate to the OR.    Roney Marion, MD 06/04/13 [phone]

## 2013-06-04 NOTE — ED Notes (Signed)
Pt reports aching in chest and arms

## 2013-06-05 ENCOUNTER — Encounter (HOSPITAL_COMMUNITY): Payer: Self-pay | Admitting: Surgery

## 2013-06-05 LAB — COMPREHENSIVE METABOLIC PANEL
ALT: 16 U/L (ref 0–53)
AST: 15 U/L (ref 0–37)
Albumin: 3.4 g/dL — ABNORMAL LOW (ref 3.5–5.2)
Alkaline Phosphatase: 64 U/L (ref 39–117)
BUN: 14 mg/dL (ref 6–23)
CO2: 25 mEq/L (ref 19–32)
Chloride: 94 mEq/L — ABNORMAL LOW (ref 96–112)
GFR calc non Af Amer: 83 mL/min — ABNORMAL LOW (ref >90)
Glucose, Bld: 125 mg/dL — ABNORMAL HIGH (ref 70–99)
Potassium: 3.2 mEq/L — ABNORMAL LOW (ref 3.5–5.1)
Sodium: 132 mEq/L — ABNORMAL LOW (ref 135–145)
Total Bilirubin: 0.8 mg/dL (ref 0.3–1.2)
Total Protein: 6.7 g/dL (ref 6.0–8.3)

## 2013-06-05 LAB — CBC
HCT: 37.3 % — ABNORMAL LOW (ref 39.0–52.0)
Hemoglobin: 13.4 g/dL (ref 13.0–17.0)
MCHC: 35.9 g/dL (ref 30.0–36.0)
Platelets: 192 10*3/uL (ref 150–400)
RDW: 13.6 % (ref 11.5–15.5)
WBC: 9.2 10*3/uL (ref 4.0–10.5)

## 2013-06-05 NOTE — Discharge Summary (Signed)
[facility] Discharge Summary   Patient ID:  Albert Powell MRN: [id number] DOB/AGE: 1941/06/06 72 y.o.  Admit date: 06/04/2013 Discharge date: 06/05/2013 Date of Surgery: * No surgery found * Surgeon:   Admission Diagnosis: Postoperative hematoma, initial encounter [998.12]  Discharge Diagnoses:  Postoperative hematoma, initial encounter [998.12]  Secondary Diagnoses: Past Medical History  Diagnosis Date  . Onychomycosis   . GERD (gastroesophageal reflux disease)   . History of colonoscopy 2004    finding of tics and AMV only no polpys  . DDD (degenerative disc disease) 2008  . Cataract   . Diabetes mellitus   . Rosacea   . Hypertension   . COPD (chronic obstructive pulmonary disease)   . Hyperlipidemia 02/05/2013  . Tobacco abuse 02/05/2013  . Claudication     LEA DUPLEX, 07/07/2008 - Normal  . CAD (coronary artery disease)     stent to RCA 2003 also had 40% lesion at that time; NUCLEAR STRESS TEST, 01/16/2010 - normal  now with cath 80-90% stenosis in LAD, will try medical therapy if no improvement PCI  . Bradycardia, sinus 02/18/2013  . Carotid artery disease   . H/O hiatal hernia       Discharged Condition: good  HPI:  Pt admitted with left neck swelling and feeling of malaise  Hospital Course:  Admitted to 2000.  Swelling symptoms subjectively better overnight.  Eating drinking swallowing ok feels better overall.  Still has small to moderate hematoma but no indication to drain at this point  Consults:  Treatment Team:  Sherren Kerns, MD  Significant Diagnostic Studies: CBC Lab Results  Component Value Date   WBC 9.2 06/05/2013   HGB 13.4 06/05/2013   HCT 37.3* 06/05/2013   MCV 88.6 06/05/2013   PLT 192 06/05/2013    BMET    Component Value Date/Time   NA 132* 06/05/2013 0500   K 3.2* 06/05/2013 0500   CL 94* 06/05/2013 0500   CO2 25 06/05/2013 0500   GLUCOSE 125* 06/05/2013 0500   BUN 14 06/05/2013 0500   CREATININE 0.91 06/05/2013  0500   CREATININE 1.01 04/19/2013 1344   CALCIUM 9.3 06/05/2013 0500   GFRNONAA 83* 06/05/2013 0500   GFRAA >90 06/05/2013 0500   COAG Lab Results  Component Value Date   INR 0.99 06/04/2013   INR 0.93 05/31/2013   INR 0.98 04/19/2013     Disposition:  Discharge to :Home Discharge Orders   Future Appointments Provider Department Dept Phone   06/13/2013 10:00 AM Nada Libman, MD [facility] [phone]   Future Orders Complete By Expires   Call MD for:  redness, tenderness, or signs of infection (pain, swelling, bleeding, redness, odor or green/yellow discharge around incision site)  As directed    Call MD for:  severe or increased pain, loss or decreased feeling  in affected limb(s)  As directed    Call MD for:  temperature >100.5  As directed    Driving Restrictions  As directed    Comments:     No driving for 2 weeks   Lifting restrictions  As directed    Comments:     No lifting for 2 weeks   Resume previous diet  As directed        Medication List         amLODipine 10 MG tablet  Commonly known as:  NORVASC  Take 10 mg by mouth every evening.     aspirin EC 81  MG tablet  Take 81 mg by mouth daily.     atorvastatin 40 MG tablet  Commonly known as:  LIPITOR  Take 40 mg by mouth daily.     citalopram 20 MG tablet  Commonly known as:  CELEXA  Take 20 mg by mouth daily.     Fluticasone-Salmeterol 100-50 MCG/DOSE Aepb  Commonly known as:  ADVAIR  Inhale 1 puff into the lungs 2 (two) times daily as needed (for shortness of breath).     glucose blood test strip  1 each by Other route as needed. Use as instructed     glucose monitoring kit monitoring kit  1 each by Does not apply route as needed.     isosorbide mononitrate 60 MG 24 hr tablet  Commonly known as:  IMDUR  Take 1 tablet (60 mg total) by mouth daily.     losartan-hydrochlorothiazide 100-25 MG per tablet  Commonly known as:  HYZAAR  Take 1 tablet by mouth every evening.      metoprolol succinate 25 MG 24 hr tablet  Commonly known as:  TOPROL-XL  Take 12.5 mg by mouth daily.     metroNIDAZOLE 0.75 % cream  Commonly known as:  METROCREAM  Apply 1 application topically 2 (two) times daily.     nitroGLYCERIN 0.4 MG SL tablet  Commonly known as:  NITROSTAT  Place 0.4 mg under the tongue every 5 (five) minutes as needed for chest pain.     omeprazole 20 MG capsule  Commonly known as:  PRILOSEC  Take 20 mg by mouth daily.     oxyCODONE 5 MG immediate release tablet  Commonly known as:  ROXICODONE  Take 1 tablet (5 mg total) by mouth every 6 (six) hours as needed for pain.       Verbal and written Discharge instructions given to the patient. Wound care per Discharge AVS   Signed: Sherren Kerns 06/05/2013, 9:44 AM

## 2013-06-05 NOTE — Progress Notes (Signed)
[facility] [place]  Subjective  - Pt feels better today thinks swelling is improved   Objective 157/67 71 98.9 F (37.2 C) (Oral) 18 93% No intake or output data in the 24 hours ending 06/05/13 0940  Left neck incision small to moderate hematoma, large neck baseline Neuro UE/LE 5/5 tongue midline  Assessment/Planning: Hematoma left neck subjective symptoms improved, no airway issues will d/c home today  Albert Powell E 06/05/2013 9:40 AM --  Laboratory Lab Results:  Recent Labs  06/04/13 1210 06/05/13 0500  WBC 10.6* 9.2  HGB 14.0 13.4  HCT 39.6 37.3*  PLT 179 192   BMET  Recent Labs  06/04/13 1210 06/05/13 0500  NA 134* 132*  K 4.4 3.2*  CL 94* 94*  CO2 30 25  GLUCOSE 132* 125*  BUN 10 14  CREATININE 0.86 0.91  CALCIUM 9.4 9.3    COAG Lab Results  Component Value Date   INR 0.99 06/04/2013   INR 0.93 05/31/2013   INR 0.98 04/19/2013   No results found for this basename: PTT

## 2013-06-06 LAB — GLUCOSE, CAPILLARY: Glucose-Capillary: 133 mg/dL — ABNORMAL HIGH (ref 70–99)

## 2013-06-10 ENCOUNTER — Encounter: Payer: Self-pay | Admitting: Surgery

## 2013-06-12 NOTE — Discharge Summary (Signed)
I agree with the above  Albert Powell 

## 2013-06-13 ENCOUNTER — Ambulatory Visit (INDEPENDENT_AMBULATORY_CARE_PROVIDER_SITE_OTHER): Payer: Self-pay | Admitting: Surgery

## 2013-06-13 ENCOUNTER — Encounter: Payer: Self-pay | Admitting: Surgery

## 2013-06-13 DIAGNOSIS — I6529 Occlusion and stenosis of unspecified carotid artery: Secondary | ICD-10-CM

## 2013-06-13 NOTE — Progress Notes (Signed)
The patient is back today for followup. He is status post left carotid endarterectomy with patch angioplasty on 06/02/2013. This was done for high-grade asymptomatic left carotid stenosis. Intraoperative findings were a 95% lesion. The patient did well initially and was discharged home on postoperative day one. He returned shortly thereafter because of complaints of swelling in the left neck. He was admitted overnight and then discharged the following day. He states that he had some issues with swallowing secondary to pain for approximately one week, however these have improved. He also complained of hoarseness, but this is getting better as well.  A visible examination, his incision has healed nicely. There is a hematoma underlying the incision. The area is soft.  Overall he is doing well. I told him that the hematoma would resolve with time. He has no neurologic symptoms. I have scheduled him to followup in 6 months. He will be getting surveillance imaging at [facility] with Dr. Allyson Sabal

## 2013-06-27 ENCOUNTER — Encounter: Payer: Medicare Other | Admitting: Surgery

## 2013-06-29 DIAGNOSIS — I739 Peripheral vascular disease, unspecified: Secondary | ICD-10-CM | POA: Diagnosis not present

## 2013-06-29 DIAGNOSIS — I10 Essential (primary) hypertension: Secondary | ICD-10-CM | POA: Diagnosis not present

## 2013-06-29 DIAGNOSIS — E119 Type 2 diabetes mellitus without complications: Secondary | ICD-10-CM | POA: Diagnosis not present

## 2013-08-26 ENCOUNTER — Other Ambulatory Visit: Payer: Self-pay | Admitting: Cardiovascular Disease

## 2013-08-26 ENCOUNTER — Encounter: Payer: Self-pay | Admitting: Cardiovascular Disease

## 2013-08-26 ENCOUNTER — Ambulatory Visit (INDEPENDENT_AMBULATORY_CARE_PROVIDER_SITE_OTHER): Payer: Medicare Other | Admitting: Cardiovascular Disease

## 2013-08-26 VITALS — BP 136/72 | HR 80 | Ht 69.0 in | Wt 236.9 lb

## 2013-08-26 DIAGNOSIS — D689 Coagulation defect, unspecified: Secondary | ICD-10-CM

## 2013-08-26 DIAGNOSIS — Z79899 Other long term (current) drug therapy: Secondary | ICD-10-CM | POA: Diagnosis not present

## 2013-08-26 DIAGNOSIS — I779 Disorder of arteries and arterioles, unspecified: Secondary | ICD-10-CM

## 2013-08-26 DIAGNOSIS — I739 Peripheral vascular disease, unspecified: Secondary | ICD-10-CM | POA: Diagnosis not present

## 2013-08-26 DIAGNOSIS — Z01818 Encounter for other preprocedural examination: Secondary | ICD-10-CM

## 2013-08-26 DIAGNOSIS — I10 Essential (primary) hypertension: Secondary | ICD-10-CM

## 2013-08-26 DIAGNOSIS — E785 Hyperlipidemia, unspecified: Secondary | ICD-10-CM

## 2013-08-26 NOTE — Assessment & Plan Note (Signed)
On statin therapy. Followed by his PCP 

## 2013-08-26 NOTE — Progress Notes (Signed)
08/26/2013 Albert Powell   December 30, 1940  [id number]  Primary Physician [name], MD Primary Cardiologist: [name] MD [name]   HPI:  72y.o. male with known history of CAD status post stenting with 2 stents to RCA in 2003 and residual LAD, hypertension and hyperlipidemia and diabetes mellitus on diet, presented to the ER because of jaw discomfort with chest pressure for 2-3 days. Patient's symptoms usually happen in the night. .  Patient was negative for myocardial infarction he had a LexiScan Myoview 02/07/13 which was negative for ischemia, but due to similarities of his prior coronary disease and the symptoms and multiple risk factors for coronary disease including family history hypertension diabetes known coronary artery disease patient was scheduled for cardiac catheterization with Dr. Allyson Sabal. Revealing 80-90% focal distal LAD disease and a 1.5-1.7 mm vessel left circumflex nondominant with a 90% AV groove stenosis in a small vessel there was a moderate to large obtuse marginal branch with a 60% proximal segmental calcified stenosis. The RCA had widely patent stents. EF was 60%. Due to the small size of the vessels and the negative nuclear stress test that are very filled medical therapy was the patient's best option.  Patient was seen back in followup last week with a heart rate of 48 symptomatic with weakness feeling very tired. He still had some shortness of breath but no jaw pain which is his anginal equivalent. Medications were adjusted because Lopressor actually had him hold it one day and then he was started back half a tablet daily he felt better so he is now resumed half tablet twice a day.  Today's back for followup initially his heart rate was 60 after he walked in the room but after sitting for 10-15 minutes EKG revealed sinus bradycardia rate of 49 but no acute changes otherwise. His blood pressure was improved from his last visit.    Additionally he denies any shortness of breath he denies any jaw pain.  Since he was seen by [name] 02/24/13 and his beta blockers were adjusted he no longer is bradycardic or symptomatic. He denies chest pain or shortness of breath. I do hear a carotid bruit on exam and cardiac echo Doppler studies on him.  I performed carotid Dopplers on him 04/11/13 which revealed a moderately high-grade left ICA stenosis. He is neurologically asymptomatic. Based on the intermediate nature of his Doppler study is unclear to me whether he has a lesion that can be followed conservatively by ultrasound or requires surgical revascularization. Because of this I decided to proceed with carotid angiography to further define the degree of stenosis.this was performed on 04/25/13 revealing a 95% proximal left internal carotid artery stenosis which suddenly underwent endarterectomy by Dr. [name] on 06/02/13.I also performed angiography on his aortoiliac  Arteries revealing 90% calcified right extrailiac artery stenosis amenable to Dynabac/colectomy for lifestyle limiting claudication.    Current Outpatient Prescriptions  Medication Sig Dispense Refill  . amLODipine (NORVASC) 10 MG tablet Take 10 mg by mouth every evening.       [name] aspirin EC 81 MG tablet Take 81 mg by mouth daily.      [name] atorvastatin (LIPITOR) 40 MG tablet Take 40 mg by mouth daily.      . citalopram (CELEXA) 20 MG tablet Take 20 mg by mouth daily.      . Fluticasone-Salmeterol (ADVAIR) 100-50 MCG/DOSE AEPB Inhale 1 puff into the lungs 2 (two) times daily as needed (for shortness of breath).       [name]  glucose blood test strip 1 each by Other route as needed. Use as instructed       . glucose monitoring kit (FREESTYLE) monitoring kit 1 each by Does not apply route as needed.        [name] HYDROcodone-acetaminophen (NORCO) 10-325 MG per tablet Take 1 tablet by mouth as needed.      . isosorbide mononitrate (IMDUR) 60 MG 24 hr tablet Take 1 tablet (60 mg total) by  mouth daily.      [name] losartan-hydrochlorothiazide (HYZAAR) 100-25 MG per tablet Take 1 tablet by mouth every evening.      . metoprolol succinate (TOPROL-XL) 25 MG 24 hr tablet Take 12.5 mg by mouth daily.      . metroNIDAZOLE (METROCREAM) 0.75 % cream Apply 1 application topically 2 (two) times daily.       . nitroGLYCERIN (NITROSTAT) 0.4 MG SL tablet Place 0.4 mg under the tongue every 5 (five) minutes as needed for chest pain.      [name] omeprazole (PRILOSEC) 20 MG capsule Take 20 mg by mouth daily.        No current facility-administered medications for this visit.    Allergies  Allergen Reactions  . Fentanyl Other (See Comments)    Behavioral changes  . Gabapentin     ankle swells  . Lisinopril     unknown  . Lyrica [Pregabalin]     unknown  . Other Other (See Comments)    Flush, rash with scallops, shrimp. Recent with shrimp    History   Social History  . Marital Status: Married    Spouse Name: N/A    Number of Children: N/A  . Years of Education: N/A   Occupational History  . Not on file.   Social History Main Topics  . Smoking status: Former Smoker -- 0.75 packs/day for 57 years    Types: Cigarettes    Quit date: 04/06/2013  . Smokeless tobacco: Former Neurosurgeon    Quit date: 04/14/2013     Comment: pt states that he is using the vapor cigs  . Alcohol Use: 0.6 oz/week    1 Glasses of wine per week  . Drug Use: No  . Sexual Activity: Not on file   Other Topics Concern  . Not on file   Social History Narrative  . No narrative on file     Review of Systems: General: negative for chills, fever, night sweats or weight changes.  Cardiovascular: negative for chest pain, dyspnea on exertion, edema, orthopnea, palpitations, paroxysmal nocturnal dyspnea or shortness of breath Dermatological: negative for rash Respiratory: negative for cough or wheezing Urologic: negative for hematuria Abdominal: negative for nausea, vomiting, diarrhea, bright red blood per rectum,  melena, or hematemesis Neurologic: negative for visual changes, syncope, or dizziness All other systems reviewed and are otherwise negative except as noted above.    Blood pressure 136/72, pulse 80, height 5\' 9"  (1.753 m), weight 236 lb 14.4 oz (107.457 kg).  General appearance: alert and no distress Neck: no adenopathy, no carotid bruit, no JVD, supple, symmetrical, trachea midline, thyroid not enlarged, symmetric, no tenderness/mass/nodules and well-healed left carotid endarterectomy scar Lungs: clear to auscultation bilaterally Heart: regular rate and rhythm, S1, S2 normal, no murmur, click, rub or gallop Extremities: extremities normal, atraumatic, no cyanosis or edema  EKG not performed today  ASSESSMENT AND PLAN:   Carotid artery disease Mr. Loeffelholz underwent cerebral angiography by myself 04/25/13 revealing a 95% proximal left internal carotid artery stenosis. Both the underwent  uncomplicated left carotid endarterectomy with bovine pericardial patch interplasty by Dr. [name] 06/02/13. He has some residual numbness in his left cheek. [place] performed carotid Doppler studies the next several weeks. He is an enema to see Dr. [name] back in April. His scar has healed well.  Peripheral arterial disease hhe had angiography performed 04/25/13 revealing 90% calcified right external iliac artery stenosis with a 100 mmHg pullback gradient after administration of intra-arterial and glistening. Dopplers reveal a right ABI 0.91 with a high-frequency signal in that location. He is symptomatic. Plan to be performed diamondback orbital rotational atherectomy, PTA and stenting in late January at his request.  HTN (hypertension) Controlled on current medications  Hyperlipidemia On statin therapy. Followed by his PCP      [name] MD [facility], [facility] 08/26/2013 3:16 PM

## 2013-08-26 NOTE — Assessment & Plan Note (Signed)
Controlled on current medications 

## 2013-08-26 NOTE — Assessment & Plan Note (Signed)
Albert Powell underwent cerebral angiography by myself 04/25/13 revealing a 95% proximal left internal carotid artery stenosis. Both the underwent uncomplicated left carotid endarterectomy with bovine pericardial patch interplasty by Dr. [name] 06/02/13. He has some residual numbness in his left cheek. [name] performed carotid Doppler studies the next several weeks. He is an enema to see Dr. [name] back in April. His scar has healed well.

## 2013-08-26 NOTE — Assessment & Plan Note (Signed)
hhe had angiography performed 04/25/13 revealing 90% calcified right external iliac artery stenosis with a 100 mmHg pullback gradient after administration of intra-arterial and glistening. Dopplers reveal a right ABI 0.91 with a high-frequency signal in that location. He is symptomatic. Plan to be performed diamondback orbital rotational atherectomy, PTA and stenting in late January at his request.

## 2013-08-26 NOTE — Patient Instructions (Signed)
Dr. Allyson Sabal has ordered a peripheral angiogram to be done at [facility].  This procedure is going to look at the bloodflow in your lower extremities.  If Dr. Allyson Sabal is able to open up the arteries, you will have to spend one night in the hospital.  If he is not able to open the arteries, you will be able to go home that same day.    After the procedure, you will not be allowed to drive for 3 days or push, pull, or lift anything greater than 10 lbs for one week.    You will be required to have bloodwork prior to your procedure.  Our scheduler will advise you on when this item needs to be done. You do not need a chest xray.     REP: Minerva Areola      Dr Allyson Sabal has ordered carotid dopplers to be done.

## 2013-08-30 ENCOUNTER — Encounter: Payer: Self-pay | Admitting: Cardiovascular Disease

## 2013-09-20 ENCOUNTER — Ambulatory Visit (INDEPENDENT_AMBULATORY_CARE_PROVIDER_SITE_OTHER): Payer: Medicare Other | Admitting: Cardiology

## 2013-09-20 ENCOUNTER — Encounter: Payer: Self-pay | Admitting: Cardiology

## 2013-09-20 VITALS — BP 140/90 | HR 61 | Ht 69.0 in | Wt 236.7 lb

## 2013-09-20 DIAGNOSIS — E119 Type 2 diabetes mellitus without complications: Secondary | ICD-10-CM

## 2013-09-20 DIAGNOSIS — I739 Peripheral vascular disease, unspecified: Secondary | ICD-10-CM

## 2013-09-20 DIAGNOSIS — I779 Disorder of arteries and arterioles, unspecified: Secondary | ICD-10-CM

## 2013-09-20 DIAGNOSIS — E669 Obesity, unspecified: Secondary | ICD-10-CM

## 2013-09-20 DIAGNOSIS — I2510 Atherosclerotic heart disease of native coronary artery without angina pectoris: Secondary | ICD-10-CM | POA: Diagnosis not present

## 2013-09-20 DIAGNOSIS — E785 Hyperlipidemia, unspecified: Secondary | ICD-10-CM

## 2013-09-20 NOTE — Assessment & Plan Note (Signed)
Known RCIA disease by angiogram Aug 2014

## 2013-09-20 NOTE — Assessment & Plan Note (Signed)
LCEA Sept 2014

## 2013-09-20 NOTE — Progress Notes (Signed)
09/20/2013 Albert Powell   October 21, 1940  [id number]  Primary Physicia Horton Finer, MD Primary Cardiologist: Dr Gwenlyn Found  HPI:  73 y/o known to Dr Gwenlyn Found with CAD and PVD. He has had a remote RCA stent. Cath in June 2014 revealed patent RCA stents and small vessel LAD and CFX disease. He had a low risk Myoview and has been treated medically. He was found to have severe carotid disease this past summer. Angiogram in August also revealed RCIA disease as well. He had his LCEA by Dr Trula Slade in Sept. He is seen now for pre angiogram H&P. He has had no problems since we saw him last.    Current Outpatient Prescriptions  Medication Sig Dispense Refill  . amLODipine (NORVASC) 10 MG tablet Take 10 mg by mouth every evening.       [name] aspirin EC 81 MG tablet Take 81 mg by mouth daily.      [name] atorvastatin (LIPITOR) 40 MG tablet Take 40 mg by mouth daily.      . citalopram (CELEXA) 20 MG tablet Take 20 mg by mouth daily.      . Fluticasone-Salmeterol (ADVAIR) 100-50 MCG/DOSE AEPB Inhale 1 puff into the lungs 2 (two) times daily as needed (for shortness of breath).       [name] glucose blood test strip 1 each by Other route as needed. Use as instructed       . glucose monitoring kit (FREESTYLE) monitoring kit 1 each by Does not apply route as needed.        [name] HYDROcodone-acetaminophen (NORCO) 10-325 MG per tablet Take 1 tablet by mouth as needed.      . isosorbide mononitrate (IMDUR) 60 MG 24 hr tablet Take 1 tablet (60 mg total) by mouth daily.      [name] losartan-hydrochlorothiazide (HYZAAR) 100-25 MG per tablet Take 1 tablet by mouth every evening.      . metoprolol succinate (TOPROL-XL) 25 MG 24 hr tablet Take 12.5 mg by mouth daily.      . metroNIDAZOLE (METROCREAM) 0.75 % cream Apply 1 application topically 2 (two) times daily.       . nitroGLYCERIN (NITROSTAT) 0.4 MG SL tablet Place 0.4 mg under the tongue every 5 (five) minutes as needed for chest pain.      [name] omeprazole (PRILOSEC) 20 MG capsule Take 20  mg by mouth daily.        No current facility-administered medications for this visit.    Allergies  Allergen Reactions  . Fentanyl Other (See Comments)    Behavioral changes  . Gabapentin     ankle swells  . Lisinopril     unknown  . Lyrica [Pregabalin]     unknown  . Other Other (See Comments)    Flush, rash with scallops, shrimp. Recent with shrimp    History   Social History  . Marital Status: Married    Spouse Name: N/A    Number of Children: N/A  . Years of Education: N/A   Occupational History  . Not on file.   Social History Main Topics  . Smoking status: Former Smoker -- 0.75 packs/day for 57 years    Types: Cigarettes    Quit date: 04/06/2013  . Smokeless tobacco: Former Systems developer    Quit date: 04/14/2013     Comment: pt states that he is using the vapor cigs  . Alcohol Use: 0.6 oz/week    1 Glasses of wine per week  . Drug Use: No  .  Sexual Activity: Not on file   Other Topics Concern  . Not on file   Social History Narrative  . No narrative on file     Review of Systems: General: negative for chills, fever, night sweats or weight changes.  Cardiovascular: negative for chest pain, dyspnea on exertion, edema, orthopnea, palpitations, paroxysmal nocturnal dyspnea or shortness of breath Dermatological: negative for rash Respiratory: negative for cough or wheezing Urologic: negative for hematuria Abdominal: negative for nausea, vomiting, diarrhea, bright red blood per rectum, melena, or hematemesis Neurologic: negative for visual changes, syncope, or dizziness All other systems reviewed and are otherwise negative except as noted above.    Blood pressure 140/90, pulse 61, height _0  (1.753 m), weight 236 lb 11.2 oz (107.366 kg).  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 He had small vessel CAD and a negative Myoview so medical therapy was recomended  DM (diabetes mellitus) .  Hyperlipidemia .   PLAN  OK for PVA/ PTA.  Kalonji Zurawski KPA-C 09/20/2013 2:12 PM

## 2013-09-20 NOTE — Patient Instructions (Signed)
Your physician recommends that you schedule a follow-up appointment After PV angio

## 2013-09-20 NOTE — Assessment & Plan Note (Signed)
Pain Rt leg at 1/2 block walking level

## 2013-09-20 NOTE — Assessment & Plan Note (Signed)
No angina. He had small vessel CAD and a negative Myoview so medical therapy was recomended

## 2013-09-21 ENCOUNTER — Encounter: Payer: Self-pay | Admitting: Cardiology

## 2013-10-12 ENCOUNTER — Encounter (HOSPITAL_COMMUNITY): Payer: Self-pay | Admitting: Pharmacy Technician

## 2013-10-13 ENCOUNTER — Other Ambulatory Visit: Payer: Self-pay | Admitting: Cardiovascular Disease

## 2013-10-13 DIAGNOSIS — D689 Coagulation defect, unspecified: Secondary | ICD-10-CM | POA: Diagnosis not present

## 2013-10-13 DIAGNOSIS — Z79899 Other long term (current) drug therapy: Secondary | ICD-10-CM | POA: Diagnosis not present

## 2013-10-14 LAB — CBC
HEMATOCRIT: 39.3 % (ref 39.0–52.0)
Hemoglobin: 13.3 g/dL (ref 13.0–17.0)
MCH: 29.9 pg (ref 26.0–34.0)
MCHC: 33.8 g/dL (ref 30.0–36.0)
MCV: 88.3 fL (ref 78.0–100.0)
PLATELETS: 240 10*3/uL (ref 150–400)
RBC: 4.45 MIL/uL (ref 4.22–5.81)
RDW: 14.2 % (ref 11.5–15.5)
WBC: 8.4 10*3/uL (ref 4.0–10.5)

## 2013-10-14 LAB — BASIC METABOLIC PANEL
BUN: 17 mg/dL (ref 6–23)
CO2: 26 mEq/L (ref 19–32)
Calcium: 9.1 mg/dL (ref 8.4–10.5)
Chloride: 99 mEq/L (ref 96–112)
Creat: 0.94 mg/dL (ref 0.50–1.35)
Glucose, Bld: 114 mg/dL — ABNORMAL HIGH (ref 70–99)
POTASSIUM: 4.5 meq/L (ref 3.5–5.3)
SODIUM: 135 meq/L (ref 135–145)

## 2013-10-14 LAB — PROTIME-INR
INR: 0.96 (ref <1.50)
Prothrombin Time: 12.7 seconds (ref 11.6–15.2)

## 2013-10-14 LAB — APTT: aPTT: 30 seconds (ref 24–37)

## 2013-10-18 ENCOUNTER — Ambulatory Visit (HOSPITAL_COMMUNITY)
Admission: RE | Admit: 2013-10-18 | Discharge: 2013-10-18 | Disposition: A | Discharge status: Home / Self Care | Payer: Medicare Other | Source: Ambulatory Visit | Attending: Cardiovascular Disease | Admitting: Cardiovascular Disease

## 2013-10-18 DIAGNOSIS — I739 Peripheral vascular disease, unspecified: Secondary | ICD-10-CM

## 2013-10-18 DIAGNOSIS — I779 Disorder of arteries and arterioles, unspecified: Secondary | ICD-10-CM

## 2013-10-18 DIAGNOSIS — I6529 Occlusion and stenosis of unspecified carotid artery: Secondary | ICD-10-CM

## 2013-10-18 NOTE — Progress Notes (Signed)
Carotid Duplex completed S/P LCEA. No evidence of restenosis.  Marilynne Halsted, BS, RDMS, RVT

## 2013-10-20 ENCOUNTER — Other Ambulatory Visit: Payer: Self-pay | Admitting: Nurse Practitioner

## 2013-10-20 ENCOUNTER — Encounter (HOSPITAL_COMMUNITY): Payer: Self-pay | Admitting: *Deleted

## 2013-10-20 ENCOUNTER — Encounter: Payer: Self-pay | Admitting: Cardiovascular Disease

## 2013-10-20 ENCOUNTER — Ambulatory Visit (HOSPITAL_COMMUNITY)
Admission: RE | Admit: 2013-10-20 | Discharge: 2013-10-21 | Disposition: A | Discharge status: Home / Self Care | Payer: Medicare Other | Source: Ambulatory Visit | Attending: Cardiovascular Disease | Admitting: Cardiovascular Disease

## 2013-10-20 ENCOUNTER — Encounter (HOSPITAL_COMMUNITY)
Admission: RE | Disposition: A | Discharge status: Home / Self Care | Payer: Self-pay | Source: Ambulatory Visit | Attending: Cardiovascular Disease

## 2013-10-20 DIAGNOSIS — Z9861 Coronary angioplasty status: Secondary | ICD-10-CM | POA: Insufficient documentation

## 2013-10-20 DIAGNOSIS — Z79899 Other long term (current) drug therapy: Secondary | ICD-10-CM | POA: Diagnosis not present

## 2013-10-20 DIAGNOSIS — E785 Hyperlipidemia, unspecified: Secondary | ICD-10-CM | POA: Diagnosis not present

## 2013-10-20 DIAGNOSIS — I739 Peripheral vascular disease, unspecified: Secondary | ICD-10-CM

## 2013-10-20 DIAGNOSIS — Z87891 Personal history of nicotine dependence: Secondary | ICD-10-CM | POA: Insufficient documentation

## 2013-10-20 DIAGNOSIS — I70219 Atherosclerosis of native arteries of extremities with intermittent claudication, unspecified extremity: Secondary | ICD-10-CM

## 2013-10-20 DIAGNOSIS — I708 Atherosclerosis of other arteries: Secondary | ICD-10-CM | POA: Insufficient documentation

## 2013-10-20 DIAGNOSIS — I10 Essential (primary) hypertension: Secondary | ICD-10-CM

## 2013-10-20 DIAGNOSIS — I2510 Atherosclerotic heart disease of native coronary artery without angina pectoris: Secondary | ICD-10-CM | POA: Diagnosis not present

## 2013-10-20 DIAGNOSIS — I779 Disorder of arteries and arterioles, unspecified: Secondary | ICD-10-CM | POA: Diagnosis not present

## 2013-10-20 DIAGNOSIS — E119 Type 2 diabetes mellitus without complications: Secondary | ICD-10-CM | POA: Diagnosis not present

## 2013-10-20 DIAGNOSIS — Z01818 Encounter for other preprocedural examination: Secondary | ICD-10-CM

## 2013-10-20 DIAGNOSIS — Z7982 Long term (current) use of aspirin: Secondary | ICD-10-CM | POA: Diagnosis not present

## 2013-10-20 HISTORY — PX: LOWER EXTREMITY ANGIOGRAM: SHX5508

## 2013-10-20 LAB — POCT ACTIVATED CLOTTING TIME
ACTIVATED CLOTTING TIME: 188 s
Activated Clotting Time: 193 seconds
Activated Clotting Time: 210 seconds
Activated Clotting Time: 238 seconds
Activated Clotting Time: 177 seconds

## 2013-10-20 LAB — GLUCOSE, CAPILLARY
GLUCOSE-CAPILLARY: 135 mg/dL — AB (ref 70–99)
GLUCOSE-CAPILLARY: 100 mg/dL — AB (ref 70–99)
Glucose-Capillary: 116 mg/dL — ABNORMAL HIGH (ref 70–99)
Glucose-Capillary: 127 mg/dL — ABNORMAL HIGH (ref 70–99)

## 2013-10-20 LAB — PROTIME-INR
INR: 0.96 (ref 0.00–1.49)
Prothrombin Time: 12.6 seconds (ref 11.6–15.2)

## 2013-10-20 LAB — MRSA PCR SCREENING: MRSA by PCR: POSITIVE — AB

## 2013-10-20 SURGERY — ANGIOGRAM, LOWER EXTREMITY
Anesthesia: LOCAL | Laterality: Right

## 2013-10-20 MED ORDER — HYDRALAZINE HCL 20 MG/ML IJ SOLN
10.0000 mg | INTRAMUSCULAR | Status: DC
Start: 2013-10-20 — End: 2013-10-20
  Administered 2013-10-20: 10 mg via INTRAVENOUS

## 2013-10-20 MED ORDER — HYDRALAZINE HCL 20 MG/ML IJ SOLN
INTRAMUSCULAR | Status: AC
  Administered 2013-10-20: 10 mg via INTRAVENOUS
  Filled 2013-10-20: qty 1

## 2013-10-20 MED ORDER — LOSARTAN POTASSIUM-HCTZ 100-25 MG PO TABS: 1.0000 | ORAL_TABLET | Freq: Every evening | ORAL | Status: DC

## 2013-10-20 MED ORDER — ATORVASTATIN CALCIUM 40 MG PO TABS
40.0000 mg | ORAL_TABLET | Freq: Every day | ORAL | Status: DC
  Administered 2013-10-20: 19:00:00 40 mg via ORAL
  Filled 2013-10-20 (×2): qty 1

## 2013-10-20 MED ORDER — HYDROCODONE-ACETAMINOPHEN 10-325 MG PO TABS: 1.0000 | ORAL_TABLET | Freq: Four times a day (QID) | ORAL | Status: DC | PRN

## 2013-10-20 MED ORDER — METRONIDAZOLE 0.75 % EX CREA: 1.0000 "application " | TOPICAL_CREAM | Freq: Two times a day (BID) | CUTANEOUS | Status: DC

## 2013-10-20 MED ORDER — ASPIRIN EC 325 MG PO TBEC
325.0000 mg | DELAYED_RELEASE_TABLET | Freq: Every day | ORAL | Status: DC
  Filled 2013-10-20: qty 1

## 2013-10-20 MED ORDER — ASPIRIN 81 MG PO CHEW
CHEWABLE_TABLET | ORAL | Status: AC
  Administered 2013-10-20: 81 mg via ORAL
  Filled 2013-10-20: qty 1

## 2013-10-20 MED ORDER — ATROPINE SULFATE 0.1 MG/ML IJ SOLN
INTRAMUSCULAR | Status: AC
  Filled 2013-10-20: qty 10

## 2013-10-20 MED ORDER — ASPIRIN EC 81 MG PO TBEC: 81.0000 mg | DELAYED_RELEASE_TABLET | Freq: Every day | ORAL | Status: DC

## 2013-10-20 MED ORDER — HYDRALAZINE HCL 20 MG/ML IJ SOLN
10.0000 mg | INTRAMUSCULAR | Status: DC | PRN
  Administered 2013-10-20: 10 mg via INTRAVENOUS

## 2013-10-20 MED ORDER — LIDOCAINE HCL (PF) 1 % IJ SOLN
INTRAMUSCULAR | Status: AC
  Filled 2013-10-20: qty 30

## 2013-10-20 MED ORDER — NITROGLYCERIN 0.4 MG SL SUBL: 0.4000 mg | SUBLINGUAL_TABLET | SUBLINGUAL | Status: DC | PRN

## 2013-10-20 MED ORDER — ONDANSETRON HCL 4 MG/2ML IJ SOLN: 4.0000 mg | Freq: Four times a day (QID) | INTRAMUSCULAR | Status: DC | PRN

## 2013-10-20 MED ORDER — HEPARIN (PORCINE) IN NACL 2-0.9 UNIT/ML-% IJ SOLN
INTRAMUSCULAR | Status: AC
  Filled 2013-10-20: qty 500

## 2013-10-20 MED ORDER — MOMETASONE FURO-FORMOTEROL FUM 100-5 MCG/ACT IN AERO
2.0000 | INHALATION_SPRAY | Freq: Two times a day (BID) | RESPIRATORY_TRACT | Status: DC
  Administered 2013-10-20 – 2013-10-21 (×2): 2 via RESPIRATORY_TRACT
  Filled 2013-10-20: qty 8.8

## 2013-10-20 MED ORDER — LOSARTAN POTASSIUM 50 MG PO TABS
100.0000 mg | ORAL_TABLET | Freq: Every evening | ORAL | Status: DC
  Administered 2013-10-20: 100 mg via ORAL
  Filled 2013-10-20 (×3): qty 2

## 2013-10-20 MED ORDER — ISOSORBIDE MONONITRATE ER 60 MG PO TB24
60.0000 mg | ORAL_TABLET | Freq: Every day | ORAL | Status: DC
  Filled 2013-10-20 (×2): qty 1

## 2013-10-20 MED ORDER — NITROGLYCERIN 0.2 MG/ML ON CALL CATH LAB
INTRAVENOUS | Status: AC
  Filled 2013-10-20: qty 1

## 2013-10-20 MED ORDER — VERAPAMIL HCL 2.5 MG/ML IV SOLN
INTRAVENOUS | Status: AC
  Filled 2013-10-20: qty 2

## 2013-10-20 MED ORDER — MIDAZOLAM HCL 2 MG/2ML IJ SOLN
INTRAMUSCULAR | Status: AC
  Filled 2013-10-20: qty 2

## 2013-10-20 MED ORDER — FAMOTIDINE IN NACL 20-0.9 MG/50ML-% IV SOLN
INTRAVENOUS | Status: AC
  Filled 2013-10-20: qty 50

## 2013-10-20 MED ORDER — HYDROCHLOROTHIAZIDE 25 MG PO TABS
25.0000 mg | ORAL_TABLET | Freq: Every evening | ORAL | Status: DC
  Administered 2013-10-20: 19:00:00 25 mg via ORAL
  Filled 2013-10-20 (×2): qty 1

## 2013-10-20 MED ORDER — AMLODIPINE BESYLATE 10 MG PO TABS
10.0000 mg | ORAL_TABLET | Freq: Every evening | ORAL | Status: DC
  Filled 2013-10-20: qty 1

## 2013-10-20 MED ORDER — HEPARIN (PORCINE) IN NACL 2-0.9 UNIT/ML-% IJ SOLN
INTRAMUSCULAR | Status: AC
  Filled 2013-10-20: qty 1000

## 2013-10-20 MED ORDER — CITALOPRAM HYDROBROMIDE 20 MG PO TABS
20.0000 mg | ORAL_TABLET | Freq: Every day | ORAL | Status: DC
  Filled 2013-10-20: qty 1

## 2013-10-20 MED ORDER — CLOPIDOGREL BISULFATE 300 MG PO TABS
ORAL_TABLET | ORAL | Status: AC
  Filled 2013-10-20: qty 1

## 2013-10-20 MED ORDER — PANTOPRAZOLE SODIUM 40 MG PO TBEC
40.0000 mg | DELAYED_RELEASE_TABLET | Freq: Every day | ORAL | Status: DC
  Administered 2013-10-21: 40 mg via ORAL
  Filled 2013-10-20: qty 1

## 2013-10-20 MED ORDER — METOPROLOL SUCCINATE 12.5 MG HALF TABLET
12.5000 mg | ORAL_TABLET | Freq: Every day | ORAL | Status: DC
  Filled 2013-10-20: qty 1

## 2013-10-20 MED ORDER — SODIUM CHLORIDE 0.9 % IV SOLN: INTRAVENOUS | Status: AC

## 2013-10-20 MED ORDER — ASPIRIN 81 MG PO CHEW
81.0000 mg | CHEWABLE_TABLET | ORAL | Status: AC
  Administered 2013-10-20: 81 mg via ORAL

## 2013-10-20 MED ORDER — HYDRALAZINE HCL 20 MG/ML IJ SOLN
INTRAMUSCULAR | Status: AC
  Filled 2013-10-20: qty 1

## 2013-10-20 MED ORDER — HEPARIN SODIUM (PORCINE) 1000 UNIT/ML IJ SOLN
INTRAMUSCULAR | Status: AC
  Filled 2013-10-20: qty 1

## 2013-10-20 MED ORDER — METRONIDAZOLE 0.75 % EX CREA
TOPICAL_CREAM | Freq: Two times a day (BID) | CUTANEOUS | Status: DC
  Filled 2013-10-20: qty 45

## 2013-10-20 MED ORDER — SODIUM CHLORIDE 0.9 % IJ SOLN: 3.0000 mL | INTRAMUSCULAR | Status: DC | PRN

## 2013-10-20 MED ORDER — DIAZEPAM 5 MG PO TABS
ORAL_TABLET | ORAL | Status: AC
  Administered 2013-10-20: 5 mg via ORAL
  Filled 2013-10-20: qty 1

## 2013-10-20 MED ORDER — SODIUM CHLORIDE 0.9 % IV SOLN
INTRAVENOUS | Status: DC
  Administered 2013-10-20: 1000 mL via INTRAVENOUS

## 2013-10-20 MED ORDER — DIAZEPAM 5 MG PO TABS
5.0000 mg | ORAL_TABLET | ORAL | Status: AC
  Administered 2013-10-20: 5 mg via ORAL

## 2013-10-20 MED ORDER — ACETAMINOPHEN 325 MG PO TABS: 650.0000 mg | ORAL_TABLET | ORAL | Status: DC | PRN

## 2013-10-20 MED ORDER — HYDROMORPHONE HCL PF 1 MG/ML IJ SOLN
INTRAMUSCULAR | Status: AC
  Filled 2013-10-20: qty 1

## 2013-10-20 MED ORDER — CLOPIDOGREL BISULFATE 75 MG PO TABS
75.0000 mg | ORAL_TABLET | Freq: Every day | ORAL | Status: DC
  Administered 2013-10-21: 75 mg via ORAL
  Filled 2013-10-20: qty 1

## 2013-10-20 NOTE — CV Procedure (Signed)
Albert Powell is a 73 y.o. male    [id number] LOCATION:  FACILITY: [facility]  PHYSICIAN: Nanetta BattyJonathan Berry, M.D. 12/06/1940   DATE OF PROCEDURE:  10/20/2013  DATE OF DISCHARGE:     PV Angiogram/Intervention    History obtained from chart review.72y.o. male with known history of CAD status post stenting with 2 stents to RCA in 2003 and residual LAD, hypertension and hyperlipidemia and diabetes mellitus on diet, presented to the ER because of jaw discomfort with chest pressure for 2-3 days. Patient's symptoms usually happen in the night. .  Patient was negative for myocardial infarction he had a LexiScan Myoview 02/07/13 which was negative for ischemia, but due to similarities of his prior coronary disease and the symptoms and multiple risk factors for coronary disease including family history hypertension diabetes known coronary artery disease patient was scheduled for cardiac catheterization with Dr. Allyson SabalBerry. Revealing 80-90% focal distal LAD disease and a 1.5-1.7 mm vessel left circumflex nondominant with a 90% AV groove stenosis in a small vessel there was a moderate to large obtuse marginal branch with a 60% proximal segmental calcified stenosis. The RCA had widely patent stents. EF was 60%. Due to the small size of the vessels and the negative nuclear stress test that are very filled medical therapy was the patient's best option.  Patient was seen back in followup last week with a heart rate of 48 symptomatic with weakness feeling very tired. He still had some shortness of breath but no jaw pain which is his anginal equivalent. Medications were adjusted because Lopressor actually had him hold it one day and then he was started back half a tablet daily he felt better so he is now resumed half tablet twice a day.  Today's back for followup initially his heart rate was 60 after he walked in the room but after sitting for 10-15 minutes EKG revealed sinus bradycardia rate of 49 but no acute changes  otherwise. His blood pressure was improved from his last visit.  Additionally he denies any shortness of breath he denies any jaw pain.  Since he was seen by Nada BoozerLaura Ingold 02/24/13 and his beta blockers were adjusted he no longer is bradycardic or symptomatic. He denies chest pain or shortness of breath. I do hear a carotid bruit on exam and cardiac echo Doppler studies on him.  I performed carotid Dopplers on him 04/11/13 which revealed a moderately high-grade left ICA stenosis. He is neurologically asymptomatic. Based on the intermediate nature of his Doppler study is unclear to me whether he has a lesion that can be followed conservatively by ultrasound or requires surgical revascularization. Because of this I decided to proceed with carotid angiography to further define the degree of stenosis.this was performed on 04/25/13 revealing a 95% proximal left internal carotid artery stenosis which suddenly underwent endarterectomy by Dr. Myra GianottiBrabham on 06/02/13.I also performed angiography on his aortoiliac Arteries revealing 90% calcified right extrailiac artery stenosis amenable to Dynabac/colectomy for lifestyle limiting claudication.    PROCEDURE DESCRIPTION:   The patient was brought to the second floor  Cardiac cath lab in the postabsorptive state. He was  premedicated with Valium 5 mg by mouth, IV Versed and dilaudid . His right groinwas prepped and shaved in usual sterile fashion. Xylocaine 1% was used for local anesthesia. A 5 French sheath was inserted into the right common femoral artery using standard Seldinger technique. A 5 French pigtail catheter was used for abdominal aortography, bilateral iliac angiography, and bifemoral runoff using bolus chase  digital subtraction settable technique. Visipaque dye was used for the entirety of the case. Retrograde aortic pressure was monitored during the case.  HEMODYNAMICS:    AO SYSTOLIC/AO DIASTOLIC: 111/45   Angiographic Data:   1: Abdominal  aorta-no significant atherosclerotic or aneurysmal disease  2: Left lower extremity-40-60% calcified left common iliac arteries stenoses and appeared exophytic but nonobstructive  3: Right lower extremity-40-50% distal right common iliac artery calcified exophytic plaque. 90% calcified tendon lesions in the right external iliac artery with three-vessel runoff.  IMPRESSION:Albert Powell has high-grade calcified/exophytic tandem lesions in his right external iliac artery with lifestyle limiting claudication. Will proceed with diamondback orbital rotational atherectomy, PTA and stenting.  Procedure Description:the existing 5 French sheath was exchanged over an 035  Versicore wire for a 6 [place] Bright -tipped sheath. Using a 5 [place] short mental catheter the VersaCore  was exchanged for an 014 viper wire. Multiple orbital rotational atherectomy passes were performed with the 2 mm per para up to 120,000 RPMs which moderately affected severity. Metropathy was then performed using a 4 mm x 6 cm balloon overlapping fashion. The guidewire was exchanged back to a bursa core wire and stenting was performed with a 10 mm x 6 cm long Cordis Smart Nitinol self-expanding stent. Post dilatation was performed with a 7 mm but 4 cm balloon resulting in reduction of 90% tandem calcific right external iliac artery stenoses to 0% residual. The patient tolerated the procedure well. The long sheath was exchanged over a wire for a short 6 [place] sheath. The patient was given 300 mg of by mouth Plavix. He left the lab in stable condition.  Final Impression: successful diamondback orbital rotational atherectomy, PTA and stenting of high-grade calcified exophytic tandem lesions in right external iliac artery for lifestyle limiting claudication. The patient will return with dual antiplatelet therapy. The sheath will be removed once the ACT falls below 170 and pressure will be held on the groin to achieve hemostasis. The patient will  be hydrated overnight and discharged home in the morning. Followup arterial Dopplers will be performed in our office after which she will see me back.    [name] MD, [facility] 10/20/2013 10:45 AM

## 2013-10-20 NOTE — H&P (View-Only) (Signed)
09/20/2013 Albert Powell   1941/08/27  [id number]  Primary Physicia Horton Finer, MD Primary Cardiologist: Dr Gwenlyn Found  HPI:  73 y/o known to Dr Gwenlyn Found with CAD and PVD. He has had a remote RCA stent. Cath in June 2014 revealed patent RCA stents and small vessel LAD and CFX disease. He had a low risk Myoview and has been treated medically. He was found to have severe carotid disease this past summer. Angiogram in August also revealed RCIA disease as well. He had his LCEA by Dr Trula Slade in Sept. He is seen now for pre angiogram H&P. He has had no problems since we saw him last.    Current Outpatient Prescriptions  Medication Sig Dispense Refill  . amLODipine (NORVASC) 10 MG tablet Take 10 mg by mouth every evening.       [name] aspirin EC 81 MG tablet Take 81 mg by mouth daily.      [name] atorvastatin (LIPITOR) 40 MG tablet Take 40 mg by mouth daily.      . citalopram (CELEXA) 20 MG tablet Take 20 mg by mouth daily.      . Fluticasone-Salmeterol (ADVAIR) 100-50 MCG/DOSE AEPB Inhale 1 puff into the lungs 2 (two) times daily as needed (for shortness of breath).       [name] glucose blood test strip 1 each by Other route as needed. Use as instructed       . glucose monitoring kit (FREESTYLE) monitoring kit 1 each by Does not apply route as needed.        [name] HYDROcodone-acetaminophen (NORCO) 10-325 MG per tablet Take 1 tablet by mouth as needed.      . isosorbide mononitrate (IMDUR) 60 MG 24 hr tablet Take 1 tablet (60 mg total) by mouth daily.      [name] losartan-hydrochlorothiazide (HYZAAR) 100-25 MG per tablet Take 1 tablet by mouth every evening.      . metoprolol succinate (TOPROL-XL) 25 MG 24 hr tablet Take 12.5 mg by mouth daily.      . metroNIDAZOLE (METROCREAM) 0.75 % cream Apply 1 application topically 2 (two) times daily.       . nitroGLYCERIN (NITROSTAT) 0.4 MG SL tablet Place 0.4 mg under the tongue every 5 (five) minutes as needed for chest pain.      [name] omeprazole (PRILOSEC) 20 MG capsule Take 20  mg by mouth daily.        No current facility-administered medications for this visit.    Allergies  Allergen Reactions  . Fentanyl Other (See Comments)    Behavioral changes  . Gabapentin     ankle swells  . Lisinopril     unknown  . Lyrica [Pregabalin]     unknown  . Other Other (See Comments)    Flush, rash with scallops, shrimp. Recent with shrimp    History   Social History  . Marital Status: Married    Spouse Name: N/A    Number of Children: N/A  . Years of Education: N/A   Occupational History  . Not on file.   Social History Main Topics  . Smoking status: Former Smoker -- 0.75 packs/day for 57 years    Types: Cigarettes    Quit date: 04/06/2013  . Smokeless tobacco: Former Systems developer    Quit date: 04/14/2013     Comment: pt states that he is using the vapor cigs  . Alcohol Use: 0.6 oz/week    1 Glasses of wine per week  . Drug Use: No  .  Sexual Activity: Not on file   Other Topics Concern  . Not on file   Social History Narrative  . No narrative on file     Review of Systems: General: negative for chills, fever, night sweats or weight changes.  Cardiovascular: negative for chest pain, dyspnea on exertion, edema, orthopnea, palpitations, paroxysmal nocturnal dyspnea or shortness of breath Dermatological: negative for rash Respiratory: negative for cough or wheezing Urologic: negative for hematuria Abdominal: negative for nausea, vomiting, diarrhea, bright red blood per rectum, melena, or hematemesis Neurologic: negative for visual changes, syncope, or dizziness All other systems reviewed and are otherwise negative except as noted above.    Blood pressure 140/90, pulse 61, height _0  (1.753 m), weight 236 lb 11.2 oz (107.366 kg).  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 He had small vessel CAD and a negative Myoview so medical therapy was recomended  DM (diabetes mellitus) .  Hyperlipidemia .   PLAN  OK for PVA/ PTA.  Mayrani Khamis KPA-C 09/20/2013 2:12 PM

## 2013-10-20 NOTE — Progress Notes (Signed)
Janne Napoleon RN advised of patients rash when he eats shellfish.  She will advise MD

## 2013-10-20 NOTE — Interval H&P Note (Signed)
History and Physical Interval Note:  10/20/2013 9:12 AM  Albert Powell  has presented today for surgery, with the diagnosis of Claudication  The various methods of treatment have been discussed with the patient and family. After consideration of risks, benefits and other options for treatment, the patient has consented to  Procedure(s): LOWER EXTREMITY ANGIOGRAM (N/A) as a surgical intervention .  The patient's history has been reviewed, patient examined, no change in status, stable for surgery.  I have reviewed the patient's chart and labs.  Questions were answered to the patient's satisfaction.     [name]

## 2013-10-21 DIAGNOSIS — E119 Type 2 diabetes mellitus without complications: Secondary | ICD-10-CM

## 2013-10-21 DIAGNOSIS — Z01818 Encounter for other preprocedural examination: Secondary | ICD-10-CM | POA: Diagnosis not present

## 2013-10-21 DIAGNOSIS — E785 Hyperlipidemia, unspecified: Secondary | ICD-10-CM | POA: Diagnosis not present

## 2013-10-21 DIAGNOSIS — I779 Disorder of arteries and arterioles, unspecified: Secondary | ICD-10-CM | POA: Diagnosis not present

## 2013-10-21 DIAGNOSIS — I10 Essential (primary) hypertension: Secondary | ICD-10-CM

## 2013-10-21 DIAGNOSIS — I739 Peripheral vascular disease, unspecified: Secondary | ICD-10-CM | POA: Diagnosis not present

## 2013-10-21 DIAGNOSIS — I708 Atherosclerosis of other arteries: Secondary | ICD-10-CM | POA: Diagnosis not present

## 2013-10-21 DIAGNOSIS — I2510 Atherosclerotic heart disease of native coronary artery without angina pectoris: Secondary | ICD-10-CM | POA: Diagnosis not present

## 2013-10-21 LAB — BASIC METABOLIC PANEL
BUN: 14 mg/dL (ref 6–23)
CHLORIDE: 100 meq/L (ref 96–112)
CO2: 26 meq/L (ref 19–32)
Calcium: 9.4 mg/dL (ref 8.4–10.5)
Creatinine, Ser: 1.03 mg/dL (ref 0.50–1.35)
GFR calc Af Amer: 82 mL/min — ABNORMAL LOW (ref >90)
GFR calc non Af Amer: 71 mL/min — ABNORMAL LOW (ref >90)
GLUCOSE: 115 mg/dL — AB (ref 70–99)
POTASSIUM: 5 meq/L (ref 3.7–5.3)
Sodium: 138 mEq/L (ref 137–147)

## 2013-10-21 LAB — CBC
HEMATOCRIT: 39.4 % (ref 39.0–52.0)
HEMOGLOBIN: 13.5 g/dL (ref 13.0–17.0)
MCH: 30.8 pg (ref 26.0–34.0)
MCHC: 34.3 g/dL (ref 30.0–36.0)
MCV: 90 fL (ref 78.0–100.0)
Platelets: 228 10*3/uL (ref 150–400)
RBC: 4.38 MIL/uL (ref 4.22–5.81)
RDW: 13.7 % (ref 11.5–15.5)
WBC: 9.1 10*3/uL (ref 4.0–10.5)

## 2013-10-21 LAB — GLUCOSE, CAPILLARY: Glucose-Capillary: 122 mg/dL — ABNORMAL HIGH (ref 70–99)

## 2013-10-21 MED ORDER — CHLORHEXIDINE GLUCONATE CLOTH 2 % EX PADS
6.0000 | MEDICATED_PAD | Freq: Every day | CUTANEOUS | Status: DC
  Administered 2013-10-21: 06:00:00 6 via TOPICAL

## 2013-10-21 MED ORDER — CLOPIDOGREL BISULFATE 75 MG PO TABS: 75.0000 mg | ORAL_TABLET | Freq: Every day | ORAL | Status: DC

## 2013-10-21 MED ORDER — MUPIROCIN 2 % EX OINT
1.0000 "application " | TOPICAL_OINTMENT | Freq: Two times a day (BID) | CUTANEOUS | Status: DC
  Filled 2013-10-21: qty 22

## 2013-10-21 MED ORDER — PANTOPRAZOLE SODIUM 40 MG PO TBEC: 40.0000 mg | DELAYED_RELEASE_TABLET | Freq: Every day | ORAL | Status: DC

## 2013-10-21 NOTE — Progress Notes (Signed)
Subjective: Feels great. Ready for discharge. The right femoral access site is a bit sore, but he denies severe anterior groin, flank and back pain.   Objective: Vital signs in last 24 hours: Temp:  [98 F (36.7 C)-98.8 F (37.1 C)] 98.1 F (36.7 C) (02/06 0445) Pulse Rate:  [56-68] 65 (02/06 0450) Resp:  [18] 18 (02/06 0450) BP: (108-189)/(54-114) 156/54 mmHg (02/06 0605) SpO2:  [93 %-100 %] 97 % (02/06 0450) Weight:  [229 lb 4.6 oz (104.005 kg)-235 lb (106.595 kg)] 229 lb 4.6 oz (104.005 kg) (02/06 0605) Last BM Date: 10/20/13  Intake/Output from previous day: 02/05 0701 - 02/06 0700 In: 1041.3 [P.O.:580; I.V.:461.3] Out: 253 [Urine:253] Intake/Output this shift:    Medications Current Facility-Administered Medications  Medication Dose Route Frequency Provider Last Rate Last Dose  . acetaminophen (TYLENOL) tablet 650 mg  650 mg Oral Q4H PRN Runell GessJonathan J Berry, MD      . amLODipine (NORVASC) tablet 10 mg  10 mg Oral QPM Runell GessJonathan J Berry, MD      . aspirin EC tablet 325 mg  325 mg Oral Daily Runell GessJonathan J Berry, MD      . atorvastatin (LIPITOR) tablet 40 mg  40 mg Oral Daily Runell GessJonathan J Berry, MD   40 mg at 10/20/13 1922  . Chlorhexidine Gluconate Cloth 2 % PADS 6 each  6 each Topical Q0600 Runell GessJonathan J Berry, MD   6 each at 10/21/13 [phone]  . citalopram (CELEXA) tablet 20 mg  20 mg Oral Daily Runell GessJonathan J Berry, MD      . clopidogrel (PLAVIX) tablet 75 mg  75 mg Oral Q breakfast Runell GessJonathan J Berry, MD      . hydrALAZINE (APRESOLINE) injection 10 mg  10 mg Intravenous Q4H PRN Ok Anishristopher R Berge, NP   10 mg at 10/20/13 1411  . hydrochlorothiazide (HYDRODIURIL) tablet 25 mg  25 mg Oral QPM Runell GessJonathan J Berry, MD   25 mg at 10/20/13 1922  . HYDROcodone-acetaminophen (NORCO) 10-325 MG per tablet 1 tablet  1 tablet Oral Q6H PRN Runell GessJonathan J Berry, MD      . isosorbide mononitrate (IMDUR) 24 hr tablet 60 mg  60 mg Oral Daily Runell GessJonathan J Berry, MD      . losartan (COZAAR) tablet 100 mg  100 mg Oral  QPM Runell GessJonathan J Berry, MD   100 mg at 10/20/13 1921  . metoprolol succinate (TOPROL-XL) 24 hr tablet 12.5 mg  12.5 mg Oral Daily Runell GessJonathan J Berry, MD      . mometasone-formoterol [facility](DULERA) 100-5 MCG/ACT inhaler 2 puff  2 puff Inhalation BID Runell GessJonathan J Berry, MD   2 puff at 10/20/13 2149  . mupirocin ointment (BACTROBAN) 2 % 1 application  1 application Nasal BID Runell GessJonathan J Berry, MD      . nitroGLYCERIN (NITROSTAT) SL tablet 0.4 mg  0.4 mg Sublingual Q5 min PRN Runell GessJonathan J Berry, MD      . ondansetron [facility](ZOFRAN) injection 4 mg  4 mg Intravenous Q6H PRN Runell GessJonathan J Berry, MD      . pantoprazole (PROTONIX) EC tablet 40 mg  40 mg Oral Daily Runell GessJonathan J Berry, MD        PE: General appearance: alert, cooperative, no distress and moderately obese Lungs: clear to auscultation bilaterally Heart: regular rate and rhythm Extremities: no LEE; right groin: + for echymosis proximal to access site, soft, no hematoma, slight tender, no bruit Pulses: 2+ and symmetric Skin: warm and dry Neurologic: Grossly normal  Lab Results:  Recent Labs  10/21/13 0407  WBC 9.1  HGB 13.5  HCT 39.4  PLT 228   BMET  Recent Labs  10/21/13 0407  NA 138  K 5.0  CL 100  CO2 26  GLUCOSE 115*  BUN 14  CREATININE 1.03  CALCIUM 9.4   PT/INR  Recent Labs  10/20/13 0750  LABPROT 12.6  INR 0.96    Studies/Results:  PV Angio 10/20/13   HEMODYNAMICS:  AO SYSTOLIC/AO DIASTOLIC: 111/45  Angiographic Data:  1: Abdominal aorta-no significant atherosclerotic or aneurysmal disease  2: Left lower extremity-40-60% calcified left common iliac arteries stenoses and appeared exophytic but nonobstructive  3: Right lower extremity-40-50% distal right common iliac artery calcified exophytic plaque. 90% calcified tendon lesions in the right external iliac artery with three-vessel runoff.   Assessment/Plan  Active Problems:   Claudication of right lower extremity  Plan: Day 1 s/p PV intervention for lifestyle  limiting claudication, resulting in successful diamondback rotation atherectomy, PTA and stenting of a high-grade calcified exophytic tandem lesion in the right external iliac artery. No pain. 2+ DPs bilaterally. The right femoral access site is + for large area of ecchymosis, proximal to the puncture site. However, the groin is soft, free from hematoma and bruit. BP and Hgb both stable. Pt instructed to limit physical activity, particularly lifting and driving for several days to prevent further complication. Renal function is stable. Resume DAPT with ASA + Plavix. Will have pt ambulate with cardiac rehab. If no problems ambulating, will discharge home today. Will arrange LEAs and office f/u with Dr. Allyson Sabal.     LOS: 1 day    Brittainy M. Delmer Islam 10/21/2013 7:10 AM   Patient seen and examined. Agree with assessment and plan. Feels well. No chest pain. Groin site with  area of ecchymosis. S/p R external iliac atherectomy. DC today; f/u Dr. Allyson Sabal.   Lennette Bihari, MD, [facility] 10/21/2013 9:19 AM

## 2013-10-21 NOTE — Discharge Summary (Signed)
Physician Discharge Summary    Patient ID: Albert Powell MRN: [id number] DOB/AGE: [birth year] 72 y.o.  Admit date: 10/20/2013 Discharge date: 10/21/2013  Admission Diagnoses: Claudication of right lower extremity.  Discharge Diagnoses:  Active Problems:   Claudication of right lower extremity   Discharged Condition: stable  Hospital Course:  The patient is a 73 y/o male, known to Dr Allyson SabalBerry with CAD and PVD. He has had a remote RCA stent. Cath in June 2014 revealed patent RCA stents and small vessel LAD and CFX disease. He had a low risk Myoview and has been treated medically. He was found to have severe carotid disease this past summer. Angiogram in August also revealed RCIA disease as well. He had his LCEA by Dr Myra GianottiBrabham in Sept 2014.  [facility]  [facility] to undergo a planned PV Angiogram, in the setting of lifestyle limiting claudication. The procedure was performed by Dr. Allyson SabalBerry via the right femoral artery. Complete findings are listed below.  He underwent successful diamondback orbital rotational atherectomy, PTA and stenting of high-grade calcified exophytic tandem lesions in right external iliac artery. He tolerated the procedure well. He had no major post cath complications. He had mild ecchymosis proximal to the femoral puncture site, but the groin remained soft, free from hematoma and bruit. He had no leg pain. He had palpable DP pulses. HR, BP and renal function remained stable. He had no pain with ambulation. He was placed on DAPT with ASA + Plavix. His Prilosec was changed to Protonix. He was last seen and examined by Dr. Tresa EndoKelly, who determined he was stable for discharge home. He will follow-up in the office with LEAs, then will see Dr. Allyson SabalBerry for follow-up.    Consults: None  Significant Diagnostic Studies:  PV Angio 10/20/13  HEMODYNAMICS:  AO SYSTOLIC/AO DIASTOLIC: 111/45  Angiographic Data:  1: Abdominal aorta-no significant atherosclerotic or aneurysmal disease  2:  Left lower extremity-40-60% calcified left common iliac arteries stenoses and appeared exophytic but nonobstructive  3: Right lower extremity-40-50% distal right common iliac artery calcified exophytic plaque. 90% calcified tendon lesions in the right external iliac artery with three-vessel runoff   Treatments: See Hospital   Discharge Exam: Blood pressure 160/65, pulse 56, temperature 97.9 F (36.6 C), temperature source Oral, resp. rate 18, height 5\' 9"  (1.753 m), weight 229 lb 4.6 oz (104.005 kg), SpO2 97.00%.   Disposition: 01-Home or Self Care      Discharge Orders   Future Appointments Provider Department Dept Phone   10/26/2013 2:00 PM [facility] [address] [facility] [address] [phone]   11/25/2013 10:45 AM Runell GessJonathan J Berry, MD [facility] [phone]   12/26/2013 1:30 PM Nada LibmanVance W Brabham, MD [facility] [phone]   Future Orders Complete By Expires   Diet - low sodium heart healthy  As directed    Driving Restrictions  As directed    Comments:     No driving for 3 days   Increase activity slowly  As directed    Lifting restrictions  As directed    Comments:     No heavy lifting for 3 days       Medication List    STOP taking these medications       omeprazole 20 MG capsule  Commonly known as:  PRILOSEC  Replaced by:  pantoprazole 40 MG tablet      TAKE these medications       amLODipine 10 MG tablet  Commonly known as:  NORVASC  Take 10 mg by mouth every evening.     aspirin EC 81 MG tablet  Take 81 mg by mouth daily.     atorvastatin 40 MG tablet  Commonly known as:  LIPITOR  Take 40 mg by mouth daily.     citalopram 20 MG tablet  Commonly known as:  CELEXA  Take 20 mg by mouth daily.     clopidogrel 75 MG tablet  Commonly known as:  PLAVIX  Take 1 tablet (75 mg total) by mouth daily with breakfast.     Fluticasone-Salmeterol 100-50 MCG/DOSE Aepb  Commonly known as:  ADVAIR   Inhale 1 puff into the lungs 2 (two) times daily as needed (for shortness of breath).     HYDROcodone-acetaminophen 10-325 MG per tablet  Commonly known as:  NORCO  Take 1 tablet by mouth every 6 (six) hours as needed for moderate pain.     isosorbide mononitrate 60 MG 24 hr tablet  Commonly known as:  IMDUR  Take 1 tablet (60 mg total) by mouth daily.     losartan-hydrochlorothiazide 100-25 MG per tablet  Commonly known as:  HYZAAR  Take 1 tablet by mouth every evening.     metoprolol succinate 25 MG 24 hr tablet  Commonly known as:  TOPROL-XL  Take 12.5 mg by mouth daily.     metroNIDAZOLE 0.75 % cream  Commonly known as:  METROCREAM  Apply 1 application topically 2 (two) times daily.     nitroGLYCERIN 0.4 MG SL tablet  Commonly known as:  NITROSTAT  Place 0.4 mg under the tongue every 5 (five) minutes as needed for chest pain.     pantoprazole 40 MG tablet  Commonly known as:  PROTONIX  Take 1 tablet (40 mg total) by mouth daily.       Follow-up Information   Follow up with [facility] office On 10/26/2013. (2:00 PM)       Follow up with [name], MD On 10/28/2013. (10:45 AM)    Specialty:  Cardiology   Contact information:   [address] [place] [address] [phone]      TIME SPENT ON DISCHARGE, INCLUDING PHYSICIAN TIME: >30 MINUTES Signed: Robbie Lis 10/21/2013, 10:22 AM

## 2013-10-24 ENCOUNTER — Telehealth: Payer: Self-pay | Admitting: *Deleted

## 2013-10-24 ENCOUNTER — Encounter: Payer: Self-pay | Admitting: *Deleted

## 2013-10-24 DIAGNOSIS — I6529 Occlusion and stenosis of unspecified carotid artery: Secondary | ICD-10-CM

## 2013-10-24 NOTE — Telephone Encounter (Signed)
Message copied by Marella Bile on Mon Oct 24, 2013  7:18 PM ------      Message from: [name]      Created: Sun Oct 23, 2013  5:41 PM       S/P LCEA. Good results. Repeat in 6 months ------

## 2013-10-24 NOTE — Telephone Encounter (Signed)
Order placed for repeat carotid dopplers in 6 months  

## 2013-10-26 ENCOUNTER — Ambulatory Visit (HOSPITAL_COMMUNITY)
Admission: RE | Admit: 2013-10-26 | Discharge: 2013-10-26 | Disposition: A | Discharge status: Home / Self Care | Payer: Medicare Other | Source: Ambulatory Visit | Attending: Cardiovascular Disease | Admitting: Cardiovascular Disease

## 2013-10-26 ENCOUNTER — Encounter: Payer: Self-pay | Admitting: *Deleted

## 2013-10-26 ENCOUNTER — Other Ambulatory Visit (HOSPITAL_COMMUNITY): Payer: Self-pay | Admitting: Cardiovascular Disease

## 2013-10-26 DIAGNOSIS — I739 Peripheral vascular disease, unspecified: Secondary | ICD-10-CM

## 2013-10-26 NOTE — Progress Notes (Signed)
Right Lower Limited Arterial Duplex completed following post stent evaluation. A pseudoaneurysm was noted measuring 2.3 x 1.5 cm.

## 2013-10-28 ENCOUNTER — Other Ambulatory Visit: Payer: Self-pay | Admitting: *Deleted

## 2013-10-28 ENCOUNTER — Encounter (HOSPITAL_COMMUNITY): Payer: Self-pay | Admitting: Pharmacy Technician

## 2013-10-28 ENCOUNTER — Ambulatory Visit (HOSPITAL_COMMUNITY)
Admission: RE | Admit: 2013-10-28 | Discharge: 2013-10-28 | Disposition: A | Discharge status: Home / Self Care | Payer: Medicare Other | Source: Ambulatory Visit | Attending: Internal Medicine | Admitting: Internal Medicine

## 2013-10-28 DIAGNOSIS — I739 Peripheral vascular disease, unspecified: Secondary | ICD-10-CM

## 2013-10-28 DIAGNOSIS — Z01818 Encounter for other preprocedural examination: Secondary | ICD-10-CM

## 2013-10-28 MED ORDER — SODIUM CHLORIDE 0.9 % IV SOLN: INTRAVENOUS | Status: DC

## 2013-10-28 MED ORDER — MORPHINE SULFATE 10 MG/ML IJ SOLN: 10.0000 mg | INTRAMUSCULAR | Status: DC | PRN

## 2013-10-28 MED ORDER — PROMETHAZINE HCL 25 MG/ML IJ SOLN: 25.0000 mg | Freq: Four times a day (QID) | INTRAMUSCULAR | Status: DC | PRN

## 2013-10-31 DIAGNOSIS — I739 Peripheral vascular disease, unspecified: Secondary | ICD-10-CM | POA: Diagnosis not present

## 2013-10-31 DIAGNOSIS — I10 Essential (primary) hypertension: Secondary | ICD-10-CM | POA: Diagnosis not present

## 2013-10-31 DIAGNOSIS — E1159 Type 2 diabetes mellitus with other circulatory complications: Secondary | ICD-10-CM | POA: Diagnosis not present

## 2013-11-01 ENCOUNTER — Other Ambulatory Visit: Payer: Self-pay | Admitting: Cardiology

## 2013-11-01 ENCOUNTER — Ambulatory Visit (HOSPITAL_COMMUNITY)
Admission: RE | Admit: 2013-11-01 | Discharge: 2013-11-01 | Disposition: A | Discharge status: Home / Self Care | Payer: Medicare Other | Source: Ambulatory Visit | Attending: Cardiovascular Disease | Admitting: Cardiovascular Disease

## 2013-11-01 ENCOUNTER — Encounter (HOSPITAL_COMMUNITY)
Admission: RE | Disposition: A | Discharge status: Home / Self Care | Payer: Self-pay | Source: Ambulatory Visit | Attending: Cardiovascular Disease

## 2013-11-01 DIAGNOSIS — I729 Aneurysm of unspecified site: Secondary | ICD-10-CM

## 2013-11-01 DIAGNOSIS — I724 Aneurysm of artery of lower extremity: Secondary | ICD-10-CM | POA: Insufficient documentation

## 2013-11-01 DIAGNOSIS — T81718A Complication of other artery following a procedure, not elsewhere classified, initial encounter: Principal | ICD-10-CM

## 2013-11-01 HISTORY — PX: CARDIOVERSION: SHX1299

## 2013-11-01 SURGERY — CARDIOVERSION
Anesthesia: LOCAL

## 2013-11-01 MED ORDER — MORPHINE SULFATE 2 MG/ML IJ SOLN
INTRAMUSCULAR | Status: AC
  Administered 2013-11-01: 2 mg via INTRAVENOUS
  Filled 2013-11-01: qty 5

## 2013-11-01 MED ORDER — MORPHINE SULFATE 10 MG/ML IJ SOLN: 4.0000 mg | Freq: Once | INTRAMUSCULAR | Status: DC

## 2013-11-01 MED ORDER — PROMETHAZINE HCL 25 MG/ML IJ SOLN
12.5000 mg | Freq: Once | INTRAMUSCULAR | Status: DC
  Filled 2013-11-01: qty 1

## 2013-11-01 MED ORDER — ATROPINE SULFATE 0.1 MG/ML IJ SOLN
INTRAMUSCULAR | Status: AC
  Filled 2013-11-01: qty 10

## 2013-11-01 MED ORDER — SODIUM CHLORIDE 0.9 % IV SOLN
INTRAVENOUS | Status: DC
  Administered 2013-11-01: 14:00:00 via INTRAVENOUS

## 2013-11-01 NOTE — Discharge Instructions (Signed)
No lifting over 8 pounds,  For 1 week.  No strenous activity.  No driving for 3 days. Pseudoaneurysm A pseudoaneurysm occurs when there is injury to an artery. The injury allows blood to leak out of the artery. The leaking blood collects or pools and is contained in the nearby tissues. CAUSES  The most common cause for a pseudoaneurysm is a procedure such as angiography. After angiography, if the insertion site does not fully seal, blood may leak out of the artery. Other causes include:  Trauma to the walls of an artery.  Bypass artery grafting surgery.  An infection affecting the walls of an artery.  A heart attack (myocardial infarction).  The rupture of an aneurysm. SYMPTOMS  Symptoms may include:  Pain, discomfort, or tenderness at the angiography insertion site or site of trauma or surgery.  Swelling at the angiography insertion site or site of trauma or surgery.  Bruising or discoloration at the angiography insertion site or site of trauma or surgery.  A pulsing mass at the angiography insertion site or site of trauma or surgery. DIAGNOSIS  Doppler ultrasonography is used to diagnose the pseudoaneurysm. This imaging exam shows the blood flow in the arteries and pseudoaneurysm. TREATMENT A pseudoaneurysm may resolve without treatment. To prevent uncontrolled bleeding or other problems, your health care provider may recommend one of the following treatments:  Injecting a blood-clotting enzyme, such as thrombin, into the area.  Repairing the artery with surgery.  Applying pressure (compression) to the pseudoaneurysm. HOME CARE INSTRUCTIONS  Only take over-the-counter or prescription medicines for pain or discomfort as directed by your health care provider.  Take your prescribed medicine as directed by your health care provider. You may need to keep taking blood thinners if they were prescribed for you.  Follow your health care provider's advice for physical  activity.  Keep all follow-up appointments as directed by your health care provider. SEEK MEDICAL CARE IF:  Your pain, discomfort, or tenderness at the angiography insertion site or site of trauma or surgery increases.  The swelling at the angiography insertion site or site of trauma or surgery increases. SEEK IMMEDIATE MEDICAL CARE IF:   You have severe or persistent pain at the angiography insertion site or site of trauma or surgery.  There is bleeding or drainage from the angiography insertion site or site of trauma or surgery.  The extremity used as the angiography insertion site or site of trauma or surgery becomes painful, discolored, cold, or numb.  You develop chest pain or shortness of breath, feel faint, or pass out. Document Released: 02/18/2008 Document Revised: 05/04/2013 Document Reviewed: 04/11/2013 [facility] Patient Information 2014 [place], [place].

## 2013-11-01 NOTE — Consult Note (Signed)
Pt presented for pseudoaneurysm compression which was successful with use of only 2 mg Morphine.  BP stable.  No complications.  Will keep flat for 4 hours then d/c home.  Dr. Allyson Sabal present for procedure.

## 2013-11-01 NOTE — Progress Notes (Signed)
VASCULAR LAB PRELIMINARY  PRELIMINARY  PRELIMINARY  PRELIMINARY  Right ultrasound guide femoral artery compression completed.    Preliminary report:  Pseudoaneurysm closed post 10 minutes of compression. An additional 3 minutes of compression followed the actual closure time.  Latoya Maulding, RVS 11/01/2013, 2:32 PM

## 2013-11-01 NOTE — Consult Note (Signed)
Pseudoaneurysm thrombosed ultrasonographically with compression. Will recheck 2-3 weeks in office.   [name], M.D., FACP, [facility], [facility] [address] [place], [place]  [address]  [phone] 11/01/2013 3:47 PM

## 2013-11-07 ENCOUNTER — Telehealth: Payer: Self-pay | Admitting: Cardiovascular Disease

## 2013-11-07 NOTE — Telephone Encounter (Signed)
Received Samara Deist letter about going to my chart to get information about his stent. When he went tomy chart,there was no information.

## 2013-11-07 NOTE — Telephone Encounter (Signed)
Message forwarded to Kathryn, RN.  

## 2013-11-07 NOTE — Telephone Encounter (Signed)
I spoke with Albert Powell.  The letter was regarding the carotid dopplers.  He has reviewed the results.

## 2013-11-09 ENCOUNTER — Ambulatory Visit (HOSPITAL_COMMUNITY)
Admission: RE | Admit: 2013-11-09 | Discharge: 2013-11-09 | Disposition: A | Discharge status: Home / Self Care | Payer: Medicare Other | Source: Ambulatory Visit | Attending: Cardiovascular Disease | Admitting: Cardiovascular Disease

## 2013-11-09 DIAGNOSIS — T81718A Complication of other artery following a procedure, not elsewhere classified, initial encounter: Secondary | ICD-10-CM

## 2013-11-09 DIAGNOSIS — S301XXA Contusion of abdominal wall, initial encounter: Secondary | ICD-10-CM | POA: Diagnosis not present

## 2013-11-09 DIAGNOSIS — I729 Aneurysm of unspecified site: Secondary | ICD-10-CM

## 2013-11-09 DIAGNOSIS — X58XXXA Exposure to other specified factors, initial encounter: Secondary | ICD-10-CM | POA: Insufficient documentation

## 2013-11-09 NOTE — Progress Notes (Signed)
Right Lower Extremity Arterial Duplex Completed. °Brianna L Mazza,RVT °

## 2013-11-14 DIAGNOSIS — J449 Chronic obstructive pulmonary disease, unspecified: Secondary | ICD-10-CM | POA: Diagnosis not present

## 2013-11-14 DIAGNOSIS — Z0000 Encounter for general adult medical examination without abnormal findings: Secondary | ICD-10-CM | POA: Diagnosis not present

## 2013-11-14 DIAGNOSIS — I739 Peripheral vascular disease, unspecified: Secondary | ICD-10-CM | POA: Diagnosis not present

## 2013-11-14 DIAGNOSIS — I10 Essential (primary) hypertension: Secondary | ICD-10-CM | POA: Diagnosis not present

## 2013-11-14 DIAGNOSIS — E1159 Type 2 diabetes mellitus with other circulatory complications: Secondary | ICD-10-CM | POA: Diagnosis not present

## 2013-11-14 DIAGNOSIS — Z23 Encounter for immunization: Secondary | ICD-10-CM | POA: Diagnosis not present

## 2013-11-25 ENCOUNTER — Encounter: Payer: Self-pay | Admitting: Cardiovascular Disease

## 2013-11-25 ENCOUNTER — Ambulatory Visit (INDEPENDENT_AMBULATORY_CARE_PROVIDER_SITE_OTHER): Payer: Medicare Other | Admitting: Cardiovascular Disease

## 2013-11-25 VITALS — BP 128/68 | HR 56 | Ht 68.0 in | Wt 237.0 lb

## 2013-11-25 DIAGNOSIS — I739 Peripheral vascular disease, unspecified: Secondary | ICD-10-CM | POA: Diagnosis not present

## 2013-11-25 DIAGNOSIS — I2510 Atherosclerotic heart disease of native coronary artery without angina pectoris: Secondary | ICD-10-CM

## 2013-11-25 NOTE — Assessment & Plan Note (Signed)
Status post diamondback orbital rotational atherectomy, PTA and stenting of long calcified right external iliac artery stenosis on 10/20/13. I reduced a long 95% calcified lesion to 0% residual. He had three-vessel runoff. Followup Dopplers performed a week later revealed that be widely patent with an ABI of 0.8. He did have a small pseudoaneurysm in his right groin was altered ultimately was compressed successfully the hospital. He is able to ambulate with much less claudication since his intervention. He is a 2+ pedal pulses on exam. He does not have a bruit in his groin.

## 2013-11-25 NOTE — Patient Instructions (Signed)
Your physician has requested that you have a lower extremity arterial duplex to be done in 6 months. This test is an ultrasound of the arteries in the legs or arms. It looks at arterial blood flow in the legs and arms. Allow one hour for Lower and Upper Arterial scans. There are no restrictions or special instructions  Dr Allyson Sabal wants you to follow-up in 6 months after your dopplers. You will receive a reminder letter in the mail two months in advance. If you don't receive a letter, please call our office to schedule the follow-up appointment.

## 2013-11-25 NOTE — Progress Notes (Signed)
11/25/2013 Albert Powell   06/24/1941  [id number]  Primary Physician [name], MD Primary Cardiologist: [name] MD [name]   HPI:  [id number]. male with known history of CAD status post stenting with 2 stents to RCA in 2003 and residual LAD, hypertension and hyperlipidemia and diabetes mellitus on diet, presented to the ER because of jaw discomfort with chest pressure for 2-3 days. Patient's symptoms usually happen in the night. .  Patient was negative for myocardial infarction he had a LexiScan Myoview 02/07/13 which was negative for ischemia, but due to similarities of his prior coronary disease and the symptoms and multiple risk factors for coronary disease including family history hypertension diabetes known coronary artery disease patient was scheduled for cardiac catheterization with Dr. Allyson Sabal. Revealing 80-90% focal distal LAD disease and a 1.5-1.7 mm vessel left circumflex nondominant with a 90% AV groove stenosis in a small vessel there was a moderate to large obtuse marginal branch with a 60% proximal segmental calcified stenosis. The RCA had widely patent stents. EF was 60%. Due to the small size of the vessels and the negative nuclear stress test that are very filled medical therapy was the patient's best option.  Patient was seen back in followup last week with a heart rate of 48 symptomatic with weakness feeling very tired. He still had some shortness of breath but no jaw pain which is his anginal equivalent. Medications were adjusted because Lopressor actually had him hold it one day and then he was started back half a tablet daily he felt better so he is now resumed half tablet twice a day.  Today's back for followup initially his heart rate was 60 after he walked in the room but after sitting for 10-15 minutes EKG revealed sinus bradycardia rate of 49 but no acute changes otherwise. His blood pressure was improved from his last visit.  Additionally  he denies any shortness of breath he denies any jaw pain.  Since he was seen by [name] 02/24/13 and his beta blockers were adjusted he no longer is bradycardic or symptomatic. He denies chest pain or shortness of breath. I do hear a carotid bruit on exam and cardiac echo Doppler studies on him.  I performed carotid Dopplers on him 04/11/13 which revealed a moderately high-grade left ICA stenosis. He is neurologically asymptomatic. Based on the intermediate nature of his Doppler study is unclear to me whether he has a lesion that can be followed conservatively by ultrasound or requires surgical revascularization. Because of this I decided to proceed with carotid angiography to further define the degree of stenosis.this was performed on 04/25/13 revealing a 95% proximal left internal carotid artery stenosis which suddenly underwent endarterectomy by Dr. [name] on 06/02/13.I also performed angiography on his aortoiliac Arteries revealing 90% calcified right extrailiac artery stenosis.I performed diamondback or rotational atherectomy, PT and stenting of his right external iliac artery on 10/20/13. He had an excellent angiographic and clinical result. His post procedure Dopplers have improved. He did have a small pseudoaneurysm in his right groin which was successfully compressed at the hospital and has remained closed. He has a 2+ right pedal pulse on exam today.  Current Outpatient Prescriptions  Medication Sig Dispense Refill  . amLODipine (NORVASC) 10 MG tablet Take 10 mg by mouth every evening.       [name] aspirin EC 81 MG tablet Take 81 mg by mouth daily.      [name] atorvastatin (LIPITOR) 40 MG tablet Take  40 mg by mouth daily.      . citalopram (CELEXA) 20 MG tablet Take 20 mg by mouth daily.      . clopidogrel (PLAVIX) 75 MG tablet Take 1 tablet (75 mg total) by mouth daily with breakfast.  30 tablet  11  . Fluticasone-Salmeterol (ADVAIR) 100-50 MCG/DOSE AEPB Inhale 1 puff into the lungs 2 (two) times daily  as needed (for shortness of breath).       [name] HYDROcodone-acetaminophen (NORCO) 10-325 MG per tablet Take 1 tablet by mouth every 6 (six) hours as needed for moderate pain.       . isosorbide mononitrate (IMDUR) 60 MG 24 hr tablet Take 1 tablet (60 mg total) by mouth daily.      [name] losartan-hydrochlorothiazide (HYZAAR) 100-25 MG per tablet Take 1 tablet by mouth every evening.      . metoprolol succinate (TOPROL-XL) 25 MG 24 hr tablet Take 12.5 mg by mouth daily.      . metroNIDAZOLE (METROCREAM) 0.75 % cream Apply 1 application topically 2 (two) times daily.       . nitroGLYCERIN (NITROSTAT) 0.4 MG SL tablet Place 0.4 mg under the tongue every 5 (five) minutes as needed for chest pain.      . pantoprazole (PROTONIX) 40 MG tablet Take 40 mg by mouth daily.       Current Facility-Administered Medications  Medication Dose Route Frequency Provider Last Rate Last Dose  . morphine injection 10 mg  10 mg Intravenous Q1H PRN [name], MD      . promethazine (PHENERGAN) injection 25 mg  25 mg Intravenous Q6H PRN [name], MD        Allergies  Allergen Reactions  . Fentanyl Other (See Comments)    Behavioral changes  . Gabapentin     ankle swells  . Lisinopril     unknown  . Lyrica [Pregabalin]     unknown  . Other Other (See Comments)    Flush, rash with scallops, shrimp. Recent with shrimp    History   Social History  . Marital Status: Married    Spouse Name: N/A    Number of Children: N/A  . Years of Education: N/A   Occupational History  . Not on file.   Social History Main Topics  . Smoking status: Former Smoker -- 0.75 packs/day for 57 years    Types: Cigarettes    Quit date: 04/06/2013  . Smokeless tobacco: Former Neurosurgeon    Quit date: 04/14/2013     Comment: pt states that he is using the vapor cigs  . Alcohol Use: 0.6 oz/week    1 Glasses of wine per week  . Drug Use: No  . Sexual Activity: Not on file   Other Topics Concern  . Not on file   Social  History Narrative  . No narrative on file     Review of Systems: General: negative for chills, fever, night sweats or weight changes.  Cardiovascular: negative for chest pain, dyspnea on exertion, edema, orthopnea, palpitations, paroxysmal nocturnal dyspnea or shortness of breath Dermatological: negative for rash Respiratory: negative for cough or wheezing Urologic: negative for hematuria Abdominal: negative for nausea, vomiting, diarrhea, bright red blood per rectum, melena, or hematemesis Neurologic: negative for visual changes, syncope, or dizziness All other systems reviewed and are otherwise negative except as noted above.    Blood pressure 128/68, pulse 56, height 5\' 8"  (1.727 m), weight 237 lb (107.502 kg).  General appearance: alert and  no distress Neck: no adenopathy, no carotid bruit, no JVD, supple, symmetrical, trachea midline and thyroid not enlarged, symmetric, no tenderness/mass/nodules Lungs: clear to auscultation bilaterally Heart: regular rate and rhythm, S1, S2 normal, no murmur, click, rub or gallop Extremities: extremities normal, atraumatic, no cyanosis or edema and 2+ right pedal pulse, 2+ femoral pulse without bruit with a small resolving hematoma.  EKG not performed today  ASSESSMENT AND PLAN:   Peripheral arterial disease Status post diamondback orbital rotational atherectomy, PTA and stenting of long calcified right external iliac artery stenosis on 10/20/13. I reduced a long 95% calcified lesion to 0% residual. He had three-vessel runoff. Followup Dopplers performed a week later revealed that be widely patent with an ABI of 0.8. He did have a small pseudoaneurysm in his right groin was altered ultimately was compressed successfully the hospital. He is able to ambulate with much less claudication since his intervention. He is a 2+ pedal pulses on exam. He does not have a bruit in his groin.      Runell GessJonathan J. Emmagrace Runkel MD FACP,FACC,FAHA, [facility] 11/25/2013 12:05  PM

## 2013-12-23 ENCOUNTER — Encounter: Payer: Self-pay | Admitting: Surgery

## 2013-12-26 ENCOUNTER — Encounter: Payer: Self-pay | Admitting: Surgery

## 2013-12-26 ENCOUNTER — Ambulatory Visit (INDEPENDENT_AMBULATORY_CARE_PROVIDER_SITE_OTHER): Payer: Medicare Other | Admitting: Surgery

## 2013-12-26 VITALS — BP 126/64 | HR 63 | Ht 68.0 in | Wt 238.2 lb

## 2013-12-26 DIAGNOSIS — I6529 Occlusion and stenosis of unspecified carotid artery: Secondary | ICD-10-CM | POA: Diagnosis not present

## 2013-12-26 NOTE — Progress Notes (Signed)
Patient name: Albert Powell MRN: [id number] DOB: 10-29-1940 Sex: male     Chief Complaint  Patient presents with  . Carotid    6 month f/u     HISTORY OF PRESENT ILLNESS: The patient is back today for followup.  He is status post left carotid endarterectomy on 06/02/2013.  This was done for asymptomatic carotid stenosis.  Intraoperative findings included 95% stenosis.  Since I last saw the patient he has undergone lower extremity revascularization via percutaneous treatment.  He is walking much better.  He has no neurologic symptoms related to his carotid disease.  Specifically he denies numbness or weakness.  He denies amaurosis fugax.  He denies swallowing difficulty.  Past Medical History  Diagnosis Date  . Onychomycosis   . GERD (gastroesophageal reflux disease)   . History of colonoscopy 2004    finding of tics and AMV only no polpys  . DDD (degenerative disc disease) 2008  . Cataract   . Diabetes mellitus   . Rosacea   . Hypertension   . COPD (chronic obstructive pulmonary disease)   . Hyperlipidemia 02/05/2013  . Tobacco abuse 02/05/2013  . Claudication     LEA DUPLEX, 07/07/2008 - Normal  . CAD (coronary artery disease)     stent to RCA 2003 also had 40% lesion at that time; NUCLEAR STRESS TEST, 01/16/2010 - normal  now with cath 80-90% stenosis in LAD, will try medical therapy if no improvement PCI  . Bradycardia, sinus 02/18/2013  . Carotid artery disease   . H/O hiatal hernia     Past Surgical History  Procedure Laterality Date  . Colon surgery  2005    renal pouch rectal anastimosis  . Cardiac surgery      stents  2003  . Coronary angioplasty with stent placement  09/2002    2 stents to RCA  . Cardiac catheterization  08/29/2002    2-vessel CAD with high-grade stenoses in RCA; stenting to RCA  . Cardiac catheterization  2014    80-90% lesion will try medical therapy  . Colon resection  3/04, 6/04     1 bleeding diverticulitis, 2 complete colectomy  .  Appendectomy    . Tonsillectomy    . Carotid endarterectomy    . Endarterectomy Left 06/02/2013    Procedure: ENDARTERECTOMY CAROTID-LEFT;  Surgeon: Nada Libman, MD;  Location: [facility] OR;  Service: Vascular;  Laterality: Left;  . Patch angioplasty Left 06/02/2013    Procedure: PATCH ANGIOPLASTY;  Surgeon: Nada Libman, MD;  Location: [facility] OR;  Service: Vascular;  Laterality: Left;    History   Social History  . Marital Status: Married    Spouse Name: N/A    Number of Children: N/A  . Years of Education: N/A   Occupational History  . Not on file.   Social History Main Topics  . Smoking status: Former Smoker -- 0.75 packs/day for 57 years    Types: Cigarettes    Quit date: 04/06/2013  . Smokeless tobacco: Former Neurosurgeon    Quit date: 04/14/2013     Comment: pt states that he is using the vapor cigs  . Alcohol Use: 0.6 oz/week    1 Glasses of wine per week  . Drug Use: No  . Sexual Activity: Not on file   Other Topics Concern  . Not on file   Social History Narrative  . No narrative on file    Family History  Problem Relation Age of Onset  .  Heart disease Father     heart attack at 8359  . Heart attack Father   . Hyperlipidemia Father   . Colonic polyp Mother     that bled out  . COPD Mother   . Diabetes Mother   . Heart disease Sister   . Colonic polyp Sister   . Stroke Maternal Grandfather   . Heart attack Paternal Grandfather     Allergies as of 12/26/2013 - Review Complete 12/26/2013  Allergen Reaction Noted  . Fentanyl Other (See Comments) 05/07/2011  . Gabapentin  05/07/2011  . Lisinopril  05/07/2011  . Lyrica [pregabalin]  05/07/2011  . Other Other (See Comments) 05/31/2013    Current Outpatient Prescriptions on File Prior to Visit  Medication Sig Dispense Refill  . amLODipine (NORVASC) 10 MG tablet Take 10 mg by mouth every evening.       [name]. aspirin EC 81 MG tablet Take 81 mg by mouth daily.      [name]. atorvastatin (LIPITOR) 40 MG tablet Take 40 mg by  mouth daily.      . citalopram (CELEXA) 20 MG tablet Take 20 mg by mouth daily.      . clopidogrel (PLAVIX) 75 MG tablet Take 1 tablet (75 mg total) by mouth daily with breakfast.  30 tablet  11  . Fluticasone-Salmeterol (ADVAIR) 100-50 MCG/DOSE AEPB Inhale 1 puff into the lungs 2 (two) times daily as needed (for shortness of breath).       [name]. HYDROcodone-acetaminophen (NORCO) 10-325 MG per tablet Take 1 tablet by mouth every 6 (six) hours as needed for moderate pain.       . isosorbide mononitrate (IMDUR) 60 MG 24 hr tablet Take 1 tablet (60 mg total) by mouth daily.      [name]. losartan-hydrochlorothiazide (HYZAAR) 100-25 MG per tablet Take 1 tablet by mouth every evening.      . metoprolol succinate (TOPROL-XL) 25 MG 24 hr tablet Take 12.5 mg by mouth daily.      . metroNIDAZOLE (METROCREAM) 0.75 % cream Apply 1 application topically 2 (two) times daily.       . nitroGLYCERIN (NITROSTAT) 0.4 MG SL tablet Place 0.4 mg under the tongue every 5 (five) minutes as needed for chest pain.      . pantoprazole (PROTONIX) 40 MG tablet Take 40 mg by mouth daily.       Current Facility-Administered Medications on File Prior to Visit  Medication Dose Route Frequency Provider Last Rate Last Dose  . morphine injection 10 mg  10 mg Intravenous Q1H PRN Runell GessJonathan J Berry, MD      . promethazine (PHENERGAN) injection 25 mg  25 mg Intravenous Q6H PRN Runell GessJonathan J Berry, MD         REVIEW OF SYSTEMS: Please see history of present illness, otherwise all systems are negative  PHYSICAL EXAMINATION:   Vital signs are BP 126/64  Pulse 63  Ht 5\' 8"  (1.727 m)  Wt 238 lb 3.2 oz (108.047 kg)  BMI 36.23 kg/m2  SpO2 95% General: The patient appears their stated age. HEENT:  No gross abnormalities Pulmonary:  Non labored breathing Abdomen: Positive for hernia Musculoskeletal: There are no major deformities. Neurologic: No focal weakness or paresthesias are detected, Skin: There are no ulcer or rashes  noted. Psychiatric: The patient has normal affect. Cardiovascular: There is a regular rate and rhythm without significant murmur appreciated.  No carotid bruits   Diagnostic Studies I have reviewed his outside ultrasound.  This shows 0-49% bilateral carotid  disease  Assessment: Status post left carotid endarterectomy Plan: From my perspective, the patient is doing very well.  His 6 month followup ultrasound shows no significant stenosis.  The patient will continue to get yearly surveillance studies at Dr. Hazle Coca office.  He'll followup with me on an as-needed basis.  [name], M.D. [facility] Office: [phone] Pager:  [phone]

## 2014-01-16 DIAGNOSIS — H5200 Hypermetropia, unspecified eye: Secondary | ICD-10-CM | POA: Diagnosis not present

## 2014-01-16 DIAGNOSIS — E119 Type 2 diabetes mellitus without complications: Secondary | ICD-10-CM | POA: Diagnosis not present

## 2014-01-16 DIAGNOSIS — H25019 Cortical age-related cataract, unspecified eye: Secondary | ICD-10-CM | POA: Diagnosis not present

## 2014-02-09 ENCOUNTER — Other Ambulatory Visit: Payer: Self-pay | Admitting: Cardiology

## 2014-02-09 NOTE — Telephone Encounter (Signed)
Rx refill sent to patient pharmacy   

## 2014-02-15 DIAGNOSIS — E1159 Type 2 diabetes mellitus with other circulatory complications: Secondary | ICD-10-CM | POA: Diagnosis not present

## 2014-02-15 DIAGNOSIS — I10 Essential (primary) hypertension: Secondary | ICD-10-CM | POA: Diagnosis not present

## 2014-02-15 DIAGNOSIS — I739 Peripheral vascular disease, unspecified: Secondary | ICD-10-CM | POA: Diagnosis not present

## 2014-02-15 DIAGNOSIS — J449 Chronic obstructive pulmonary disease, unspecified: Secondary | ICD-10-CM | POA: Diagnosis not present

## 2014-05-29 ENCOUNTER — Ambulatory Visit (HOSPITAL_BASED_OUTPATIENT_CLINIC_OR_DEPARTMENT_OTHER)
Admission: RE | Admit: 2014-05-29 | Discharge: 2014-05-29 | Disposition: A | Discharge status: Home / Self Care | Payer: Medicare Other | Source: Ambulatory Visit | Attending: Cardiovascular Disease | Admitting: Cardiovascular Disease

## 2014-05-29 ENCOUNTER — Ambulatory Visit (HOSPITAL_COMMUNITY)
Admission: RE | Admit: 2014-05-29 | Discharge: 2014-05-29 | Disposition: A | Discharge status: Home / Self Care | Payer: Medicare Other | Source: Ambulatory Visit | Attending: Cardiology | Admitting: Cardiology

## 2014-05-29 DIAGNOSIS — I6529 Occlusion and stenosis of unspecified carotid artery: Secondary | ICD-10-CM

## 2014-05-29 DIAGNOSIS — I739 Peripheral vascular disease, unspecified: Secondary | ICD-10-CM | POA: Diagnosis not present

## 2014-05-29 DIAGNOSIS — Z48812 Encounter for surgical aftercare following surgery on the circulatory system: Secondary | ICD-10-CM | POA: Diagnosis not present

## 2014-05-29 DIAGNOSIS — I6522 Occlusion and stenosis of left carotid artery: Secondary | ICD-10-CM

## 2014-05-29 NOTE — Progress Notes (Signed)
Arterial Duplex Lower Ext. Completed. Daniela Hernan, BS, RDMS, RVT  

## 2014-05-29 NOTE — Progress Notes (Signed)
Carotid Duplex Completed. Alvira Hecht, BS, RDMS, RVT  

## 2014-06-20 DIAGNOSIS — Z23 Encounter for immunization: Secondary | ICD-10-CM | POA: Diagnosis not present

## 2014-07-05 DIAGNOSIS — J449 Chronic obstructive pulmonary disease, unspecified: Secondary | ICD-10-CM | POA: Diagnosis not present

## 2014-07-05 DIAGNOSIS — I10 Essential (primary) hypertension: Secondary | ICD-10-CM | POA: Diagnosis not present

## 2014-07-05 DIAGNOSIS — G4700 Insomnia, unspecified: Secondary | ICD-10-CM | POA: Diagnosis not present

## 2014-07-05 DIAGNOSIS — E1359 Other specified diabetes mellitus with other circulatory complications: Secondary | ICD-10-CM | POA: Diagnosis not present

## 2014-08-24 ENCOUNTER — Encounter (HOSPITAL_COMMUNITY): Payer: Self-pay | Admitting: Cardiovascular Disease

## 2014-09-06 DIAGNOSIS — S299XXA Unspecified injury of thorax, initial encounter: Secondary | ICD-10-CM | POA: Diagnosis not present

## 2014-09-06 DIAGNOSIS — S3992XA Unspecified injury of lower back, initial encounter: Secondary | ICD-10-CM | POA: Diagnosis not present

## 2014-09-06 DIAGNOSIS — M545 Low back pain: Secondary | ICD-10-CM | POA: Diagnosis not present

## 2014-10-03 ENCOUNTER — Telehealth: Payer: Self-pay | Admitting: Cardiovascular Disease

## 2014-10-03 NOTE — Telephone Encounter (Signed)
Closed encounter °

## 2014-10-04 ENCOUNTER — Telehealth: Payer: Self-pay | Admitting: Cardiovascular Disease

## 2014-10-04 NOTE — Telephone Encounter (Signed)
Called, left VM

## 2014-10-04 NOTE — Telephone Encounter (Signed)
Please call,question about his Clopidogrel.Pt says he have stopped his aspirin,excessive bleeding and bruising.This happen when he takes both. [name]

## 2014-10-05 NOTE — Telephone Encounter (Signed)
Wife answered phone. Pt out of house. I had also called earlier attempting to reach patient.   Pt had discontinued aspirin, still on Plavix per wife's understanding. He had stopped ASA due to increase epistaxis which apparently resolved. Advised wife the weather may be contributing to this but inquired on recent BPs  She is not sure if BP is up, pt takes care of all of this. I will try to follow up next week w/ pt.  Wife wanted to make sure d/c of ASA was ok. Told her I think shouldn't be a problem as long as he continues Plavix, Dr. [place] agreed w/ this.

## 2014-10-09 NOTE — Telephone Encounter (Signed)
Spoke w/ pt. BPs ok, no problems, he will f/u w/ Dr. Allyson Sabal at scheduled appt in March.

## 2014-10-14 ENCOUNTER — Other Ambulatory Visit: Payer: Self-pay | Admitting: Cardiology

## 2014-10-16 NOTE — Telephone Encounter (Signed)
Rx refill sent to patient pharmacy   

## 2014-11-09 ENCOUNTER — Other Ambulatory Visit: Payer: Self-pay | Admitting: Cardiovascular Disease

## 2014-11-09 NOTE — Telephone Encounter (Signed)
Rx has been sent to the pharmacy electronically. ° °

## 2014-11-21 DIAGNOSIS — Z0000 Encounter for general adult medical examination without abnormal findings: Secondary | ICD-10-CM | POA: Diagnosis not present

## 2014-11-21 DIAGNOSIS — I10 Essential (primary) hypertension: Secondary | ICD-10-CM | POA: Diagnosis not present

## 2014-11-21 DIAGNOSIS — J449 Chronic obstructive pulmonary disease, unspecified: Secondary | ICD-10-CM | POA: Diagnosis not present

## 2014-11-21 DIAGNOSIS — R454 Irritability and anger: Secondary | ICD-10-CM | POA: Diagnosis not present

## 2014-11-21 DIAGNOSIS — E785 Hyperlipidemia, unspecified: Secondary | ICD-10-CM | POA: Diagnosis not present

## 2014-11-21 DIAGNOSIS — Z1389 Encounter for screening for other disorder: Secondary | ICD-10-CM | POA: Diagnosis not present

## 2014-11-21 DIAGNOSIS — G4700 Insomnia, unspecified: Secondary | ICD-10-CM | POA: Diagnosis not present

## 2014-11-21 DIAGNOSIS — F329 Major depressive disorder, single episode, unspecified: Secondary | ICD-10-CM | POA: Diagnosis not present

## 2014-11-21 DIAGNOSIS — E1359 Other specified diabetes mellitus with other circulatory complications: Secondary | ICD-10-CM | POA: Diagnosis not present

## 2014-11-27 DIAGNOSIS — E291 Testicular hypofunction: Secondary | ICD-10-CM | POA: Diagnosis not present

## 2014-11-27 DIAGNOSIS — F329 Major depressive disorder, single episode, unspecified: Secondary | ICD-10-CM | POA: Diagnosis not present

## 2014-11-29 ENCOUNTER — Ambulatory Visit (INDEPENDENT_AMBULATORY_CARE_PROVIDER_SITE_OTHER): Payer: Medicare Other | Admitting: Cardiovascular Disease

## 2014-11-29 ENCOUNTER — Encounter: Payer: Self-pay | Admitting: Cardiovascular Disease

## 2014-11-29 VITALS — BP 162/76 | HR 54 | Ht 68.0 in | Wt 237.0 lb

## 2014-11-29 DIAGNOSIS — I10 Essential (primary) hypertension: Secondary | ICD-10-CM

## 2014-11-29 DIAGNOSIS — E785 Hyperlipidemia, unspecified: Secondary | ICD-10-CM | POA: Diagnosis not present

## 2014-11-29 DIAGNOSIS — I2510 Atherosclerotic heart disease of native coronary artery without angina pectoris: Secondary | ICD-10-CM

## 2014-11-29 MED ORDER — CLOPIDOGREL BISULFATE 75 MG PO TABS: 75.0000 mg | ORAL_TABLET | Freq: Every day | ORAL | Status: DC

## 2014-11-29 NOTE — Assessment & Plan Note (Signed)
History of carotid artery disease status post angiography by myself 04/25/13 revealing a 95% proximal left internal carotid artery stenosis. He underwent elective left carotid endarterectomy by Dr. [name] on 06/02/13. We've been following this by duplex ultrasound since.

## 2014-11-29 NOTE — Patient Instructions (Signed)
Dr Berry recommends that you schedule a follow-up appointment in 1 year. You will receive a reminder letter in the mail two months in advance. If you don't receive a letter, please call our office to schedule the follow-up appointment. 

## 2014-11-29 NOTE — Assessment & Plan Note (Signed)
History of peripheral arterial disease status post diamondback orbital rotational atherectomy, PThe is on Plavix.A and stenting of right common and external iliac artery on 10/20/13 for claudication. His follow-up Doppler studies performed 05/29/14 revealed the stents to be widely patent with a right ABI 0.95. He denies claudication. We will continue to follow this on an annual basis.he is on Plavix.

## 2014-11-29 NOTE — Assessment & Plan Note (Signed)
History of hypertension with blood pressure measured 162/76. He is on amlodipine in addition to losartan/hydrochlorothiazide and metoprolol. Continue current meds at current dosing

## 2014-11-29 NOTE — Progress Notes (Signed)
11/29/2014 Albert Powell   October 19, 1940  [id number]  Primary Physician Kristie Cowman, MD Primary Cardiologist: [name] MD FACP,FACC,FAHA, FSCAI   HPI:  74y.o. male with known history of CAD. I last saw him one year ago. He is  status post stenting with 2 stents to RCA in 2003 and residual LAD, hypertension and hyperlipidemia and diabetes mellitus on diet, presented to the ER because of jaw discomfort with chest pressure for 2-3 days. Patient's symptoms usually happen in the night. .  Patient was negative for myocardial infarction he had a LexiScan Myoview 02/07/13 which was negative for ischemia, but due to similarities of his prior coronary disease and the symptoms and multiple risk factors for coronary disease including family history hypertension diabetes known coronary artery disease patient was scheduled for cardiac catheterization with Dr. Allyson Sabal. Revealing 80-90% focal distal LAD disease and a 1.5-1.7 mm vessel left circumflex nondominant with a 90% AV groove stenosis in a small vessel there was a moderate to large obtuse marginal branch with a 60% proximal segmental calcified stenosis. The RCA had widely patent stents. EF was 60%. Due to the small size of the vessels and the negative nuclear stress test that are very filled medical therapy was the patient's best option.  Patient was seen back in followup last week with a heart rate of 48 symptomatic with weakness feeling very tired. He still had some shortness of breath but no jaw pain which is his anginal equivalent. Medications were adjusted because Lopressor actually had him hold it one day and then he was started back half a tablet daily he felt better so he is now resumed half tablet twice a day.  Today's back for followup initially his heart rate was 60 after he walked in the room but after sitting for 10-15 minutes EKG revealed sinus bradycardia rate of 49 but no acute changes otherwise. His blood pressure was improved from  his last visit.  Additionally he denies any shortness of breath he denies any jaw pain.  Since he was seen by [name] 02/24/13 and his beta blockers were adjusted he no longer is bradycardic or symptomatic. He denies chest pain or shortness of breath. I do hear a carotid bruit on exam and cardiac echo Doppler studies on him.  I performed carotid Dopplers on him 04/11/13 which revealed a moderately high-grade left ICA stenosis. He is neurologically asymptomatic. Based on the intermediate nature of his Doppler study is unclear to me whether he has a lesion that can be followed conservatively by ultrasound or requires surgical revascularization. Because of this I decided to proceed with carotid angiography to further define the degree of stenosis.this was performed on 04/25/13 revealing a 95% proximal left internal carotid artery stenosis which suddenly underwent endarterectomy by Dr. [name] on 06/02/13.I also performed angiography on his aortoiliac Arteries revealing 90% calcified right extrailiac artery stenosis.I performed diamondback or rotational atherectomy, PT and stenting of his right external iliac artery on 10/20/13. He had an excellent angiographic and clinical result. His post procedure Dopplers have improved. He did have a small pseudoaneurysm in his right groin which was successfully compressed at the hospital and has remained closed. He has a 2+ right pedal pulse on exam today.   Since I saw him one year ago he's remained stable. We've been following his lower extremity arterial Doppler studies and carotid Doppler studies WHICH have remained stable. His primary care physician, Dr. Bosie Clos, follow his lipid profile as an outpatient.  Current Outpatient Prescriptions  Medication Sig Dispense Refill  . amLODipine (NORVASC) 10 MG tablet Take 10 mg by mouth every evening.     [name] atorvastatin (LIPITOR) 40 MG tablet Take 40 mg by mouth daily.    . citalopram (CELEXA) 20 MG tablet Take 40 mg by  mouth daily.     . clopidogrel (PLAVIX) 75 MG tablet Take 1 tablet (75 mg total) by mouth daily. 30 tablet 11  . Fluticasone-Salmeterol (ADVAIR) 100-50 MCG/DOSE AEPB Inhale 1 puff into the lungs 2 (two) times daily as needed (for shortness of breath).     [name] HYDROcodone-acetaminophen (NORCO) 7.5-325 MG per tablet as needed.   0  . isosorbide mononitrate (IMDUR) 60 MG 24 hr tablet Take 1 tablet (60 mg total) by mouth daily.    [name] losartan-hydrochlorothiazide (HYZAAR) 100-25 MG per tablet Take 1 tablet by mouth every evening.    . metoprolol succinate (TOPROL-XL) 25 MG 24 hr tablet TAKE 1/2 TABLET BY MOUTH DAILY 15 tablet 6  . metroNIDAZOLE (METROCREAM) 0.75 % cream Apply 1 application topically 2 (two) times daily.     . nitroGLYCERIN (NITROSTAT) 0.4 MG SL tablet Place 0.4 mg under the tongue every 5 (five) minutes as needed for chest pain.    . pantoprazole (PROTONIX) 40 MG tablet Take 40 mg by mouth daily.     Current Facility-Administered Medications  Medication Dose Route Frequency Provider Last Rate Last Dose  . morphine injection 10 mg  10 mg Intravenous Q1H PRN [name], MD      . promethazine (PHENERGAN) injection 25 mg  25 mg Intravenous Q6H PRN [name], MD        Allergies  Allergen Reactions  . Fentanyl Other (See Comments)    Behavioral changes  . Gabapentin     ankle swells  . Lisinopril     unknown  . Lyrica [Pregabalin]     unknown  . Other Other (See Comments)    Flush, rash with scallops, shrimp. Recent with shrimp    History   Social History  . Marital Status: Married    Spouse Name: N/A  . Number of Children: N/A  . Years of Education: N/A   Occupational History  . Not on file.   Social History Main Topics  . Smoking status: Former Smoker -- 0.75 packs/day for 57 years    Types: Cigarettes    Quit date: 04/06/2013  . Smokeless tobacco: Former Neurosurgeon    Quit date: 04/14/2013     Comment: pt states that he is using the vapor cigs  .  Alcohol Use: 0.6 oz/week    1 Glasses of wine per week  . Drug Use: No  . Sexual Activity: Not on file   Other Topics Concern  . Not on file   Social History Narrative     Review of Systems: General: negative for chills, fever, night sweats or weight changes.  Cardiovascular: negative for chest pain, dyspnea on exertion, edema, orthopnea, palpitations, paroxysmal nocturnal dyspnea or shortness of breath Dermatological: negative for rash Respiratory: negative for cough or wheezing Urologic: negative for hematuria Abdominal: negative for nausea, vomiting, diarrhea, bright red blood per rectum, melena, or hematemesis Neurologic: negative for visual changes, syncope, or dizziness All other systems reviewed and are otherwise negative except as noted above.    Blood pressure 162/76, pulse 54, height 5\' 8"  (1.727 m), weight 237 lb (107.502 kg).  General appearance: alert and no distress Neck: no adenopathy, no carotid bruit,  no JVD, supple, symmetrical, trachea midline and thyroid not enlarged, symmetric, no tenderness/mass/nodules Lungs: clear to auscultation bilaterally Heart: regular rate and rhythm, S1, S2 normal, no murmur, click, rub or gallop Extremities: extremities normal, atraumatic, no cyanosis or edema  EKG sinus bradycardia 54 without ST or T-wave changes. I personally reviewed this EKG  ASSESSMENT AND PLAN:   Peripheral arterial disease History of peripheral arterial disease status post diamondback orbital rotational atherectomy, PThe is on Plavix.A and stenting of right common and external iliac artery on 10/20/13 for claudication. His follow-up Doppler studies performed 05/29/14 revealed the stents to be widely patent with a right ABI 0.95. He denies claudication. We will continue to follow this on an annual basis.he is on Plavix.   Occlusion and stenosis of carotid artery without mention of cerebral infarction History of carotid artery disease status post angiography by  myself 04/25/13 revealing a 95% proximal left internal carotid artery stenosis. He underwent elective left carotid endarterectomy by Dr. [name] on 06/02/13. We've been following this by duplex ultrasound since.   Hyperlipidemia History of hyperlipidemia on atorvastatin 40 mg a day followed by his PCP   HTN (hypertension) History of hypertension with blood pressure measured 162/76. He is on amlodipine in addition to losartan/hydrochlorothiazide and metoprolol. Continue current meds at current dosing   CAD hx of 2 stents to RCA in 2003,  80-90% stenosis of LAD/CFX June 2014- medical Rx History of CAD status post stenting of his RCA in 2003 with residual LAD disease. He did have a cath performed May 2014 at which time stents were noted to be patent. He had EF of 60%. The remainder of this obstructive vessels were too small to intervene on the medical therapy was recommended. He has remained a synthetic stent.       [name] MD FACP,FACC,FAHA, [facility] 11/29/2014 11:49 AM

## 2014-11-29 NOTE — Assessment & Plan Note (Signed)
History of CAD status post stenting of his RCA in 2003 with residual LAD disease. He did have a cath performed May 2014 at which time stents were noted to be patent. He had EF of 60%. The remainder of this obstructive vessels were too small to intervene on the medical therapy was recommended. He has remained a synthetic stent.

## 2014-11-29 NOTE — Assessment & Plan Note (Signed)
History of hyperlipidemia on atorvastatin 40 mg a day followed by his PCP 

## 2014-12-07 ENCOUNTER — Ambulatory Visit (INDEPENDENT_AMBULATORY_CARE_PROVIDER_SITE_OTHER): Payer: Medicare Other | Admitting: Endocrinology

## 2014-12-07 ENCOUNTER — Encounter: Payer: Self-pay | Admitting: Endocrinology

## 2014-12-07 VITALS — BP 126/80 | HR 63 | Temp 98.2°F | Ht 68.0 in | Wt 236.0 lb

## 2014-12-07 DIAGNOSIS — I2510 Atherosclerotic heart disease of native coronary artery without angina pectoris: Secondary | ICD-10-CM | POA: Diagnosis not present

## 2014-12-07 DIAGNOSIS — E291 Testicular hypofunction: Secondary | ICD-10-CM | POA: Diagnosis not present

## 2014-12-07 DIAGNOSIS — Z125 Encounter for screening for malignant neoplasm of prostate: Secondary | ICD-10-CM

## 2014-12-07 LAB — FOLLICLE STIMULATING HORMONE: FSH: 10 m[IU]/mL (ref 1.4–18.1)

## 2014-12-07 LAB — LUTEINIZING HORMONE: LH: 3.67 m[IU]/mL (ref 3.10–34.60)

## 2014-12-07 LAB — PSA, MEDICARE: PSA: 1 ng/ml (ref 0.10–4.00)

## 2014-12-07 LAB — TSH: TSH: 5.25 u[IU]/mL — ABNORMAL HIGH (ref 0.35–4.50)

## 2014-12-07 NOTE — Progress Notes (Signed)
Subjective:    Patient ID: Albert Powell, male    DOB: 05/14/1941, 74 y.o.   MRN: [id number]  HPI Pt reports he had puberty at the normal age.  He has  No biological children.  He says he has never taken illicit androgens.  He has never been on any prescribed medication for hypogonadism.  He does not take antiandrogens or opioids (except occasional norco).  He denies any h/o infertility, XRT, or genital infection.  He has never had surgery, or a serious injury to the head or genital area.  He does not consume alcohol excessively.  Pt reports few years of slightly decreased strength throughout the body, and assoc fatigue.   Past Medical History  Diagnosis Date  . Onychomycosis   . GERD (gastroesophageal reflux disease)   . History of colonoscopy 2004    finding of tics and AMV only no polpys  . DDD (degenerative disc disease) 2008  . Cataract   . Diabetes mellitus   . Rosacea   . Hypertension   . COPD (chronic obstructive pulmonary disease)   . Hyperlipidemia 02/05/2013  . Tobacco abuse 02/05/2013  . Claudication     LEA DUPLEX, 07/07/2008 - Normal  . CAD (coronary artery disease)     stent to RCA 2003 also had 40% lesion at that time; NUCLEAR STRESS TEST, 01/16/2010 - normal  now with cath 80-90% stenosis in LAD, will try medical therapy if no improvement PCI  . Bradycardia, sinus 02/18/2013  . Carotid artery disease   . H/O hiatal hernia     Past Surgical History  Procedure Laterality Date  . Colon surgery  2005    renal pouch rectal anastimosis  . Cardiac surgery      stents  2003  . Coronary angioplasty with stent placement  09/2002    2 stents to RCA  . Cardiac catheterization  08/29/2002    2-vessel CAD with high-grade stenoses in RCA; stenting to RCA  . Cardiac catheterization  2014    80-90% lesion will try medical therapy  . Colon resection  3/04, 6/04     1 bleeding diverticulitis, 2 complete colectomy  . Appendectomy    . Tonsillectomy    . Carotid endarterectomy     . Endarterectomy Left 06/02/2013    Procedure: ENDARTERECTOMY CAROTID-LEFT;  Surgeon: Nada Libman, MD;  Location: [facility] OR;  Service: Vascular;  Laterality: Left;  . Patch angioplasty Left 06/02/2013    Procedure: PATCH ANGIOPLASTY;  Surgeon: Nada Libman, MD;  Location: [facility] OR;  Service: Vascular;  Laterality: Left;  . Left heart catheterization with coronary angiogram N/A 02/08/2013    Procedure: LEFT HEART CATHETERIZATION WITH CORONARY ANGIOGRAM;  Surgeon: [name], MD;  Location: [facility] CATH LAB;  Service: Cardiovascular;  Laterality: N/A;  . Carotid angiogram N/A 04/25/2013    Procedure: CAROTID ANGIOGRAM;  Surgeon: [name], MD;  Location: [facility] CATH LAB;  Service: Cardiovascular;  Laterality: N/A;  . Lower extremity angiogram N/A 10/20/2013    Procedure: LOWER EXTREMITY ANGIOGRAM;  Surgeon: [name], MD;  Location: [facility] CATH LAB;  Service: Cardiovascular;  Laterality: N/A;  . Cardioversion N/A 11/01/2013    Procedure: Dimple Nanas COMPRESSION;  Surgeon: [name], MD;  Location: [facility] CATH LAB;  Service: Cardiovascular;  Laterality: N/A;    History   Social History  . Marital Status: Married    Spouse Name: N/A  . Number of Children: N/A  . Years of Education: N/A  Occupational History  . Not on file.   Social History Main Topics  . Smoking status: Former Smoker -- 0.75 packs/day for 57 years    Types: Cigarettes    Quit date: 04/06/2013  . Smokeless tobacco: Former Neurosurgeon    Quit date: 04/14/2013     Comment: pt states that he is using the vapor cigs  . Alcohol Use: 0.6 oz/week    1 Glasses of wine per week  . Drug Use: No  . Sexual Activity: Not on file   Other Topics Concern  . Not on file   Social History Narrative    Current Outpatient Prescriptions on File Prior to Visit  Medication Sig Dispense Refill  . amLODipine (NORVASC) 10 MG tablet Take 10 mg by mouth every evening.     [name] atorvastatin (LIPITOR) 40 MG tablet Take 40 mg by mouth  daily.    . citalopram (CELEXA) 20 MG tablet Take 40 mg by mouth daily.     . clopidogrel (PLAVIX) 75 MG tablet Take 1 tablet (75 mg total) by mouth daily. 30 tablet 11  . Fluticasone-Salmeterol (ADVAIR) 100-50 MCG/DOSE AEPB Inhale 1 puff into the lungs 2 (two) times daily as needed (for shortness of breath).     [name] HYDROcodone-acetaminophen (NORCO) 7.5-325 MG per tablet as needed.   0  . isosorbide mononitrate (IMDUR) 60 MG 24 hr tablet Take 1 tablet (60 mg total) by mouth daily.    [name] losartan-hydrochlorothiazide (HYZAAR) 100-25 MG per tablet Take 1 tablet by mouth every evening.    . metoprolol succinate (TOPROL-XL) 25 MG 24 hr tablet TAKE 1/2 TABLET BY MOUTH DAILY 15 tablet 6  . nitroGLYCERIN (NITROSTAT) 0.4 MG SL tablet Place 0.4 mg under the tongue every 5 (five) minutes as needed for chest pain.    . metroNIDAZOLE (METROCREAM) 0.75 % cream Apply 1 application topically 2 (two) times daily.     . pantoprazole (PROTONIX) 40 MG tablet Take 40 mg by mouth daily.     Current Facility-Administered Medications on File Prior to Visit  Medication Dose Route Frequency Provider Last Rate Last Dose  . morphine injection 10 mg  10 mg Intravenous Q1H PRN [name], MD      . promethazine (PHENERGAN) injection 25 mg  25 mg Intravenous Q6H PRN [name], MD        Allergies  Allergen Reactions  . Fentanyl Other (See Comments)    Behavioral changes  . Gabapentin     ankle swells  . Lisinopril     unknown  . Lyrica [Pregabalin]     unknown  . Other Other (See Comments)    Flush, rash with scallops, shrimp. Recent with shrimp    Family History  Problem Relation Age of Onset  . Heart disease Father     heart attack at 27  . Heart attack Father   . Hyperlipidemia Father   . Colonic polyp Mother     that bled out  . COPD Mother   . Diabetes Mother   . Heart disease Sister   . Colonic polyp Sister   . Stroke Maternal Grandfather   . Heart attack Paternal Grandfather   .  Other Neg Hx     hypogonadism    BP 126/80 mmHg  Pulse 63  Temp(Src) 98.2 F (36.8 C) (Oral)  Ht  (1.727 m)  Wt 236 lb (107.049 kg)  BMI 35.89 kg/m2  SpO2 95%     Review of Systems denies  numbness, erectile dysfunction, weight change, decreased urinary stream, gynecomastia, arthralgias, fever, headache, easy bruising, sob, rash, blurry vision, chest pain.  Depression is well-controlled.  He has ED sxs and rhinorrhea.     Objective:   Physical Exam VS: see vs page GEN: no distress HEAD: head: no deformity eyes: no periorbital swelling, no proptosis external nose and ears are normal mouth: no lesion seen NECK: a healed scar is present (left CEA).  i do not appreciate a nodule in the thyroid or elsewhere in the neck CHEST WALL: no deformity LUNGS: clear to auscultation BREASTS:  bilat pseudogynecomastia CV: reg rate and rhythm, no murmur ABD: abdomen is soft, nontender.  no hepatosplenomegaly.  not distended.  Moderate self-reducing ventral hernia.  Old healed midline surgical scar GENITALIA:  Normal male.   MUSCULOSKELETAL: muscle bulk and strength are grossly normal.  no obvious joint swelling.  gait is normal and steady EXTEMITIES: no deformity.  1+ bilat leg edema PULSES: no carotid bruit NEURO:  cn 2-12 grossly intact.   readily moves all 4's.  sensation is intact to touch on all 4's. SKIN:  Normal texture and temperature.  No rash or suspicious lesion is visible.   NODES:  None palpable at the neck PSYCH: alert, well-oriented.  Does not appear anxious nor depressed.    outside test results are reviewed: Testosterone (total)=273 (free =4.6)  i have reviewed the following old records: Office notes     Assessment & Plan:  Hypogonadism: new, mild, uncertain etiology.  Pt says he probably wants to decline rx. CAD: viagra is not rx'ed due to being nitrates.  We could ref urol, but pt does not want rx  Patient is advised the following: Patient Instructions    blood tests are being requested for you today.  We'll let you know about the results. normalization of testosterone is not known to harm you.  however, there are "theoretical" risks, including increased fertility, hair loss, prostate cancer, benign prostate enlargement, blood clots, liver problems, lower hdl ("good cholesterol"), polycythemia (opposite of anemia), sleep apnea, and behavior changes weight loss helps the testosterone, also. It is fine with me to just receck the testosterone in the future, rather than taking treatment for it.

## 2014-12-07 NOTE — Patient Instructions (Addendum)
blood tests are being requested for you today.  We'll let you know about the results. normalization of testosterone is not known to harm you.  however, there are "theoretical" risks, including increased fertility, hair loss, prostate cancer, benign prostate enlargement, blood clots, liver problems, lower hdl ("good cholesterol"), polycythemia (opposite of anemia), sleep apnea, and behavior changes weight loss helps the testosterone, also. It is fine with me to just receck the testosterone in the future, rather than taking treatment for it.

## 2014-12-08 LAB — TESTOSTERONE,FREE AND TOTAL
Testosterone, Free: 10 pg/mL (ref 6.6–18.1)
Testosterone: 218 ng/dL — ABNORMAL LOW (ref 348–1197)

## 2014-12-08 LAB — PROLACTIN: Prolactin: 11.6 ng/mL (ref 2.1–17.1)

## 2015-01-19 DIAGNOSIS — H2513 Age-related nuclear cataract, bilateral: Secondary | ICD-10-CM | POA: Diagnosis not present

## 2015-01-19 DIAGNOSIS — E119 Type 2 diabetes mellitus without complications: Secondary | ICD-10-CM | POA: Diagnosis not present

## 2015-01-19 DIAGNOSIS — H5203 Hypermetropia, bilateral: Secondary | ICD-10-CM | POA: Diagnosis not present

## 2015-01-31 ENCOUNTER — Encounter: Payer: Self-pay | Admitting: Cardiology

## 2015-02-19 IMAGING — CR DG CHEST 2V
2 series · 2 of 2 positions shown · non-contrast
Comparison: February 04, 2013.

CLINICAL DATA: Hypertension

CHEST - 2 VIEW

[w chest pa]
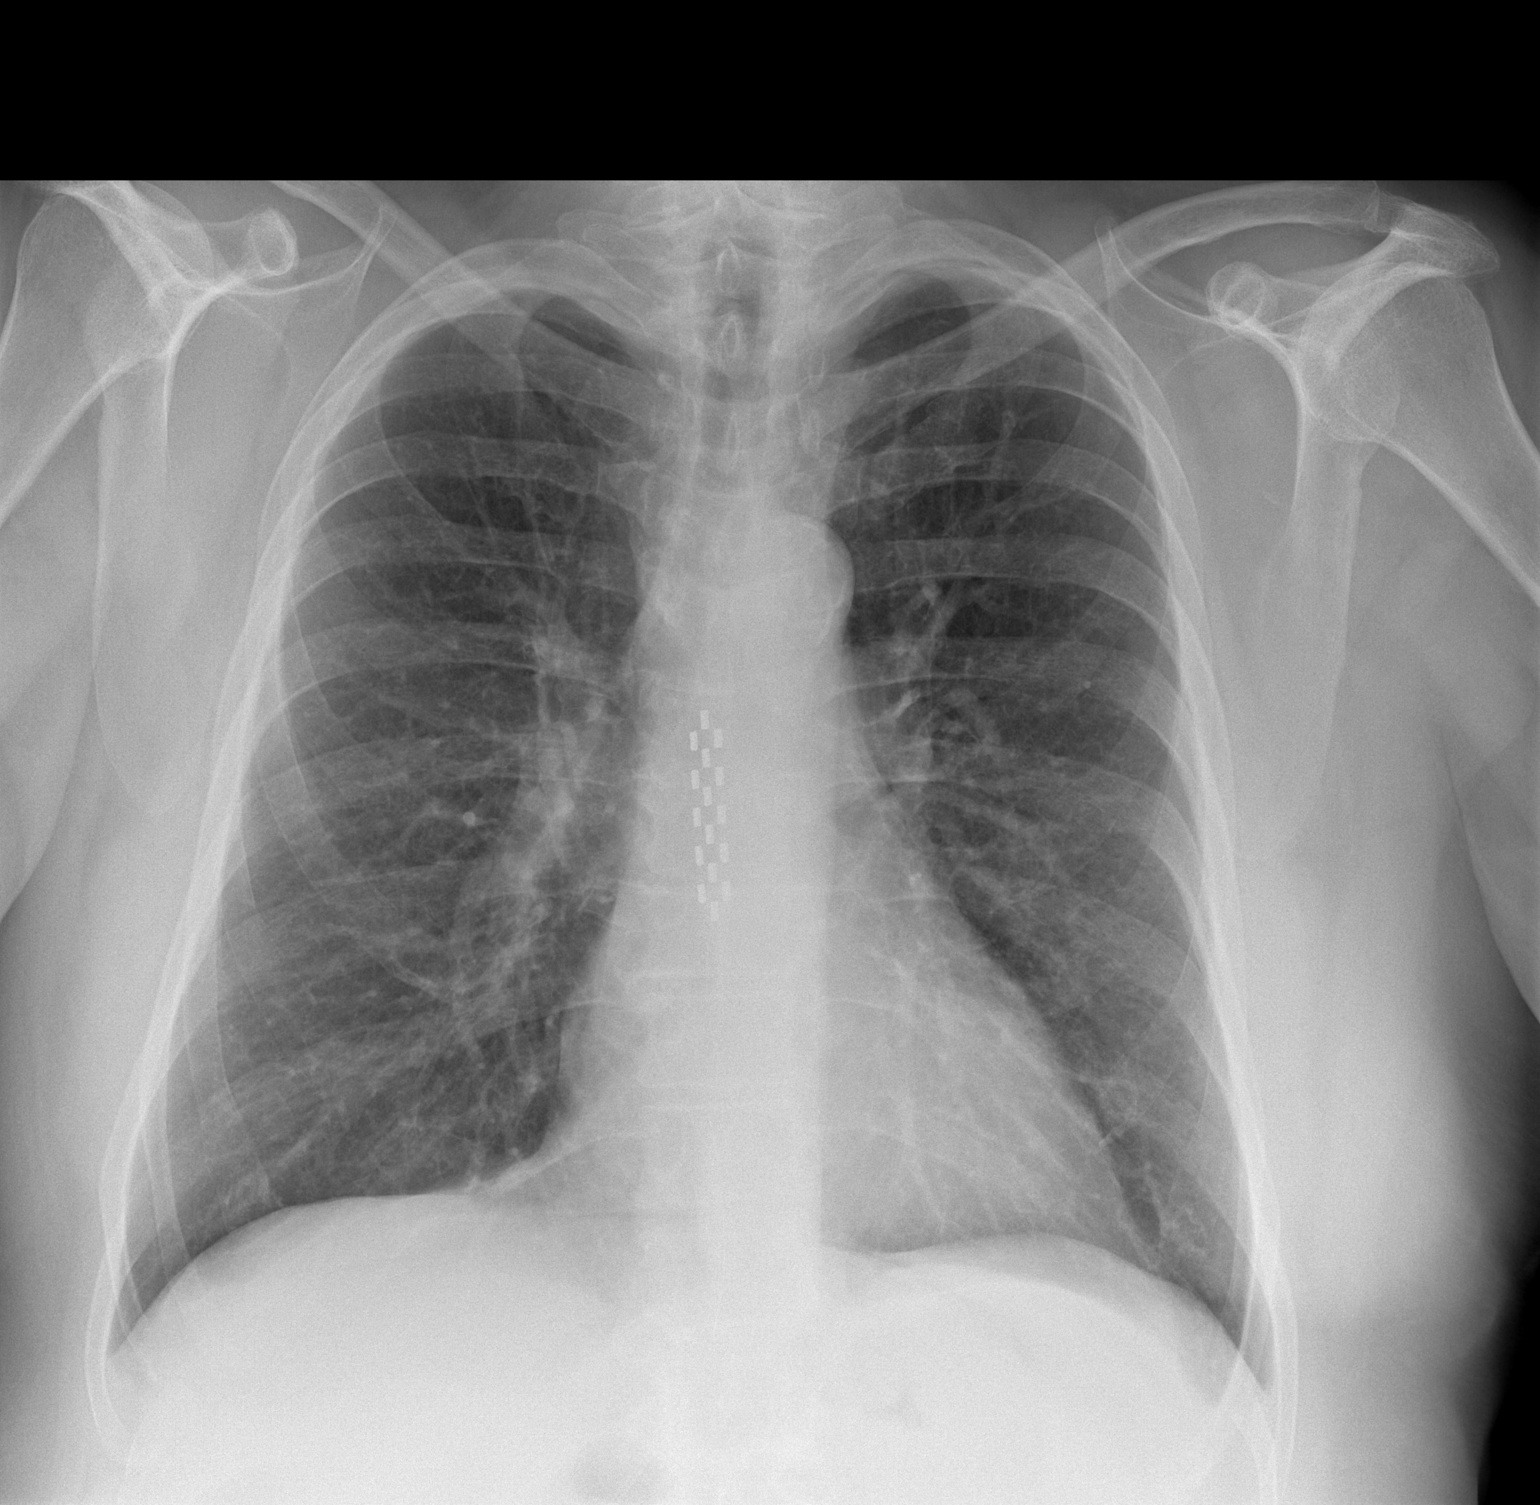

[w chest lat]
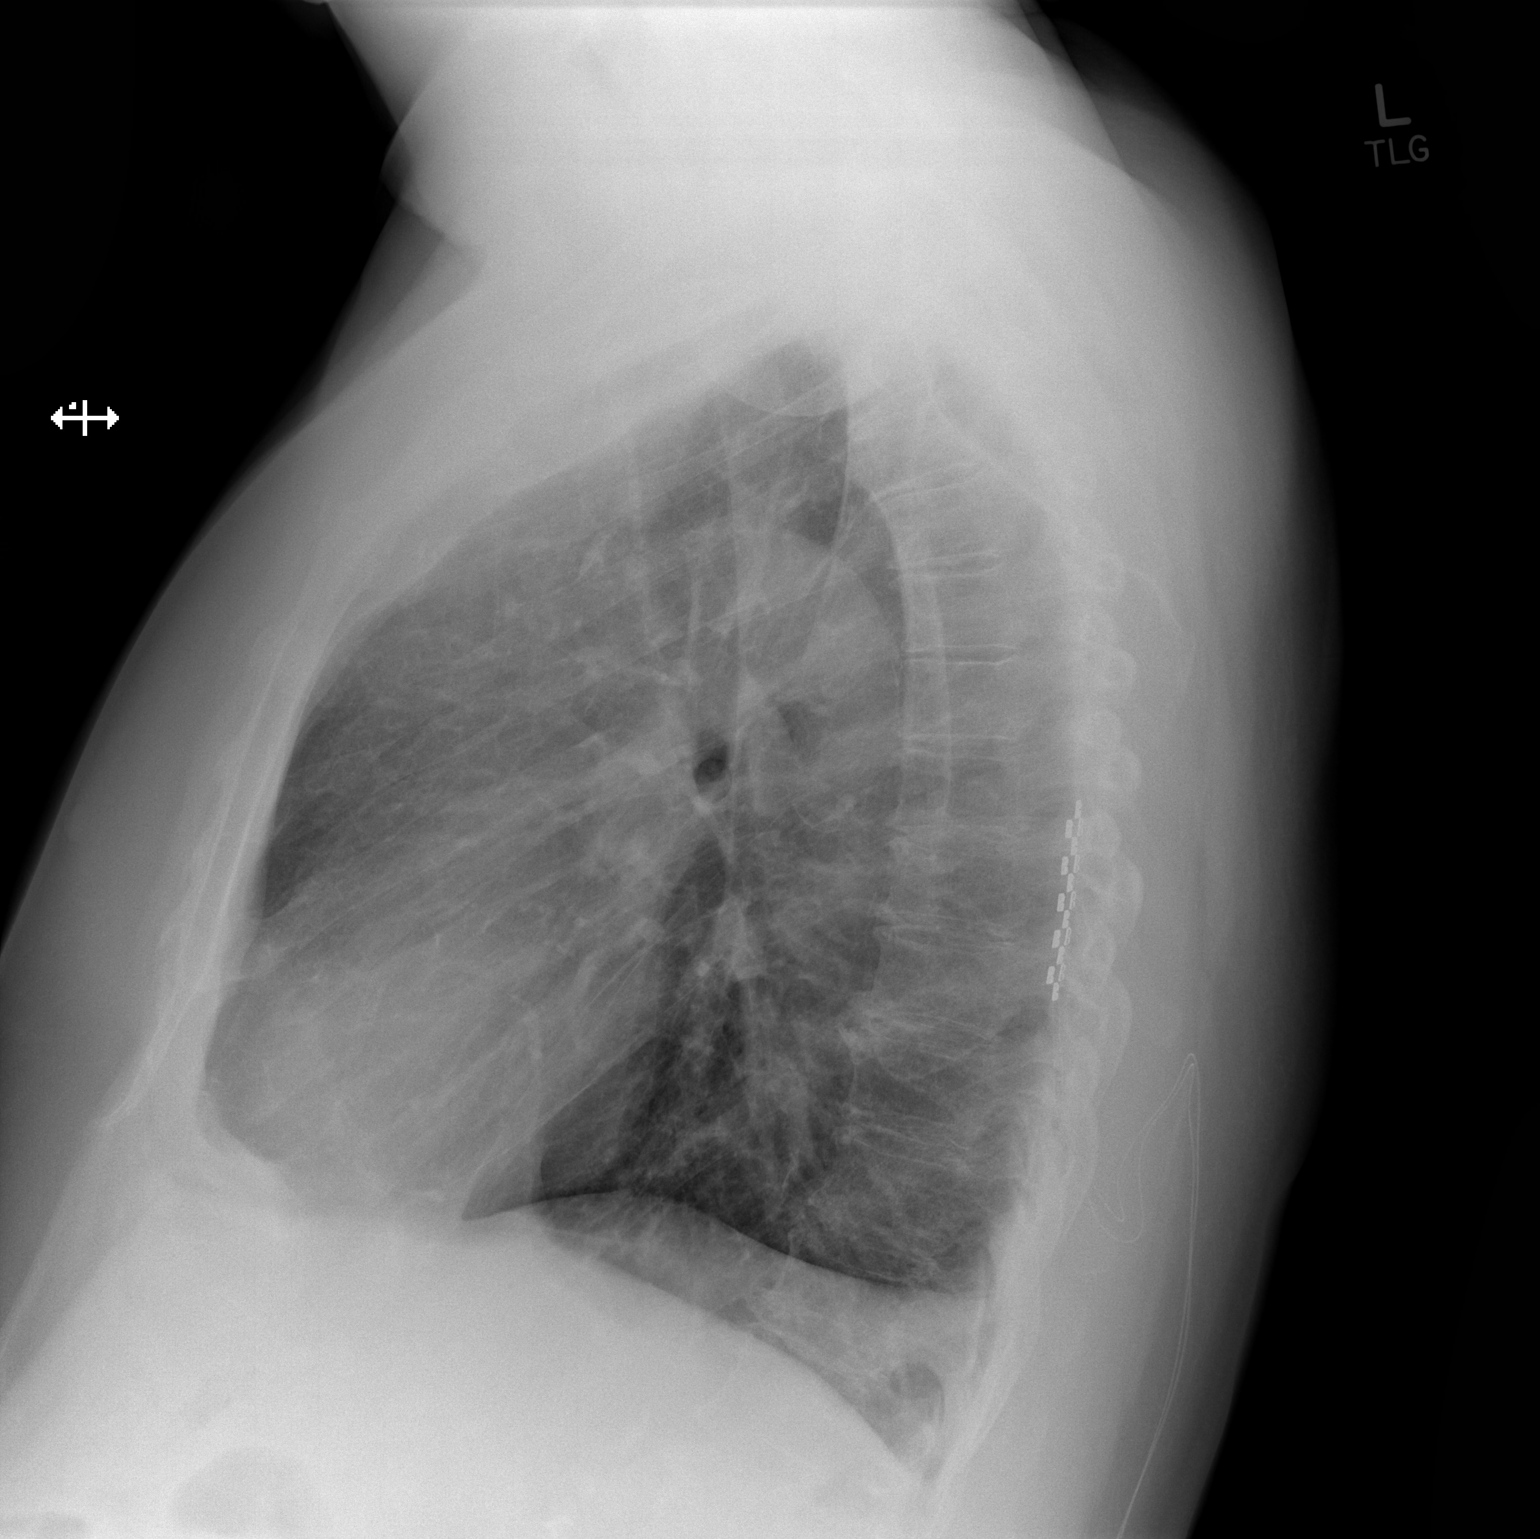

[2 of 2 positions shown; findings below may reference images not displayed]

FINDINGS: Cardiomediastinal silhouette appears normal.  No acute
pulmonary disease is noted.  Bony thorax is intact.  Stimulator
leads are seen projected over the mid thoracic spinal canal.
IMPRESSION: No acute cardiopulmonary abnormality seen.

## 2015-04-04 DIAGNOSIS — E1159 Type 2 diabetes mellitus with other circulatory complications: Secondary | ICD-10-CM | POA: Diagnosis not present

## 2015-04-04 DIAGNOSIS — I739 Peripheral vascular disease, unspecified: Secondary | ICD-10-CM | POA: Diagnosis not present

## 2015-04-04 DIAGNOSIS — E785 Hyperlipidemia, unspecified: Secondary | ICD-10-CM | POA: Diagnosis not present

## 2015-04-04 DIAGNOSIS — I10 Essential (primary) hypertension: Secondary | ICD-10-CM | POA: Diagnosis not present

## 2015-04-04 DIAGNOSIS — F329 Major depressive disorder, single episode, unspecified: Secondary | ICD-10-CM | POA: Diagnosis not present

## 2015-04-04 DIAGNOSIS — J449 Chronic obstructive pulmonary disease, unspecified: Secondary | ICD-10-CM | POA: Diagnosis not present

## 2015-06-05 ENCOUNTER — Other Ambulatory Visit: Payer: Self-pay | Admitting: Cardiovascular Disease

## 2015-06-05 MED ORDER — CLOPIDOGREL BISULFATE 75 MG PO TABS: 75.0000 mg | ORAL_TABLET | Freq: Every day | ORAL | Status: DC

## 2015-06-07 ENCOUNTER — Other Ambulatory Visit: Payer: Self-pay | Admitting: *Deleted

## 2015-06-12 ENCOUNTER — Other Ambulatory Visit: Payer: Self-pay | Admitting: *Deleted

## 2015-06-12 MED ORDER — CLOPIDOGREL BISULFATE 75 MG PO TABS: 75.0000 mg | ORAL_TABLET | Freq: Every day | ORAL | Status: DC

## 2015-08-02 DIAGNOSIS — E1121 Type 2 diabetes mellitus with diabetic nephropathy: Secondary | ICD-10-CM | POA: Diagnosis not present

## 2015-08-02 DIAGNOSIS — Z23 Encounter for immunization: Secondary | ICD-10-CM | POA: Diagnosis not present

## 2015-08-02 DIAGNOSIS — J449 Chronic obstructive pulmonary disease, unspecified: Secondary | ICD-10-CM | POA: Diagnosis not present

## 2015-08-02 DIAGNOSIS — I10 Essential (primary) hypertension: Secondary | ICD-10-CM | POA: Diagnosis not present

## 2015-11-09 DIAGNOSIS — H1045 Other chronic allergic conjunctivitis: Secondary | ICD-10-CM | POA: Diagnosis not present

## 2015-11-09 DIAGNOSIS — Z91013 Allergy to seafood: Secondary | ICD-10-CM | POA: Diagnosis not present

## 2015-11-09 DIAGNOSIS — J310 Chronic rhinitis: Secondary | ICD-10-CM | POA: Diagnosis not present

## 2015-11-09 DIAGNOSIS — R21 Rash and other nonspecific skin eruption: Secondary | ICD-10-CM | POA: Diagnosis not present

## 2015-11-26 DIAGNOSIS — J069 Acute upper respiratory infection, unspecified: Secondary | ICD-10-CM | POA: Diagnosis not present

## 2015-11-26 DIAGNOSIS — E119 Type 2 diabetes mellitus without complications: Secondary | ICD-10-CM | POA: Diagnosis not present

## 2015-11-26 DIAGNOSIS — I739 Peripheral vascular disease, unspecified: Secondary | ICD-10-CM | POA: Diagnosis not present

## 2015-11-26 DIAGNOSIS — Z0000 Encounter for general adult medical examination without abnormal findings: Secondary | ICD-10-CM | POA: Diagnosis not present

## 2015-11-26 DIAGNOSIS — Z125 Encounter for screening for malignant neoplasm of prostate: Secondary | ICD-10-CM | POA: Diagnosis not present

## 2015-11-26 DIAGNOSIS — F325 Major depressive disorder, single episode, in full remission: Secondary | ICD-10-CM | POA: Diagnosis not present

## 2015-11-26 DIAGNOSIS — Z1389 Encounter for screening for other disorder: Secondary | ICD-10-CM | POA: Diagnosis not present

## 2015-11-26 DIAGNOSIS — J449 Chronic obstructive pulmonary disease, unspecified: Secondary | ICD-10-CM | POA: Diagnosis not present

## 2015-11-26 DIAGNOSIS — I10 Essential (primary) hypertension: Secondary | ICD-10-CM | POA: Diagnosis not present

## 2015-12-17 DIAGNOSIS — I10 Essential (primary) hypertension: Secondary | ICD-10-CM | POA: Diagnosis not present

## 2015-12-22 ENCOUNTER — Other Ambulatory Visit: Payer: Self-pay | Admitting: Cardiovascular Disease

## 2015-12-27 ENCOUNTER — Other Ambulatory Visit: Payer: Self-pay | Admitting: Cardiovascular Disease

## 2015-12-27 NOTE — Telephone Encounter (Signed)
Rx(s) sent to pharmacy electronically.  

## 2016-01-16 DIAGNOSIS — H2513 Age-related nuclear cataract, bilateral: Secondary | ICD-10-CM | POA: Diagnosis not present

## 2016-01-16 DIAGNOSIS — E119 Type 2 diabetes mellitus without complications: Secondary | ICD-10-CM | POA: Diagnosis not present

## 2016-01-16 DIAGNOSIS — H52203 Unspecified astigmatism, bilateral: Secondary | ICD-10-CM | POA: Diagnosis not present

## 2016-01-22 ENCOUNTER — Encounter: Payer: Self-pay | Admitting: Cardiovascular Disease

## 2016-01-22 ENCOUNTER — Ambulatory Visit (INDEPENDENT_AMBULATORY_CARE_PROVIDER_SITE_OTHER): Payer: Medicare Other | Admitting: Cardiovascular Disease

## 2016-01-22 VITALS — BP 132/74 | HR 68 | Ht 68.0 in | Wt 237.0 lb

## 2016-01-22 DIAGNOSIS — I2510 Atherosclerotic heart disease of native coronary artery without angina pectoris: Secondary | ICD-10-CM

## 2016-01-22 DIAGNOSIS — I739 Peripheral vascular disease, unspecified: Secondary | ICD-10-CM

## 2016-01-22 DIAGNOSIS — I10 Essential (primary) hypertension: Secondary | ICD-10-CM | POA: Diagnosis not present

## 2016-01-22 DIAGNOSIS — I779 Disorder of arteries and arterioles, unspecified: Secondary | ICD-10-CM

## 2016-01-22 DIAGNOSIS — E785 Hyperlipidemia, unspecified: Secondary | ICD-10-CM

## 2016-01-22 DIAGNOSIS — I2583 Coronary atherosclerosis due to lipid rich plaque: Principal | ICD-10-CM

## 2016-01-22 NOTE — Patient Instructions (Signed)
Medication Instructions:  Your physician recommends that you continue on your current medications as directed. Please refer to the Current Medication list given to you today.   Labwork: NONE  Testing/Procedures: Your physician has requested that you have a carotid duplex. This test is an ultrasound of the carotid arteries in your neck. It looks at blood flow through these arteries that supply the brain with blood. Allow one hour for this exam. There are no restrictions or special instructions.  Your physician has requested that you have a lower extremity arterial doppler- During this test, ultrasound is used to evaluate arterial blood flow in the legs. Allow approximately one hour for this exam.   Follow-Up: Your physician wants you to follow-up in: 12  MONTHS WITH DR Allyson Sabal. You will receive a reminder letter in the mail two months in advance. If you don't receive a letter, please call our office to schedule the follow-up appointment.   Any Other Special Instructions Will Be Listed Below (If Applicable).     If you need a refill on your cardiac medications before your next appointment, please call your pharmacy.

## 2016-01-22 NOTE — Assessment & Plan Note (Signed)
History of CAD status post 2 stents placed to the RCA in 2003 at [facility]. He was catheterized again June 2014 revealing 80-90% focal distal LAD disease and a small vesselas well as a 90% AV groove circumflex stenosis and nondominant vessel with some proximal circumflex obtuse marginal branch disease. The RCA stent was widely patent. EF was normal.The stress test was negative. Medical therapy was recommended. He's had no recurrent symptoms.

## 2016-01-22 NOTE — Assessment & Plan Note (Signed)
History of hyperlipidemia on statin therapy followed by his PCP 

## 2016-01-22 NOTE — Assessment & Plan Note (Signed)
History of peripheral arterial disease status post right lower extremity diamondback orbital rotational atherectomy, PTCA and stenting of the right external iliac artery 10/20/13. His follow-up blood from a Dopplers performed 05/29/14 revealed a normal right ABI with a widely patent stent. He denies claudication.

## 2016-01-22 NOTE — Assessment & Plan Note (Signed)
History of hypertension blood pressure measured at 132/74. He is on amlodipine, losartan, hydrochlorothiazide and metoprolol. Continue current meds at current dosing.

## 2016-01-22 NOTE — Progress Notes (Signed)
01/22/2016 Albert Powell   1940-09-29  [id number]  Primary Physician Kristie Cowman, MD Primary Cardiologist: [name] MD FACP,FACC,FAHA, FSCAI   HPI:  59 y/ .o. male with known history of CAD. I last saw 11/29/14.  He is status post stenting with 2 stents to RCA in 2003 and residual LAD, hypertension and hyperlipidemia and diabetes mellitus on diet, presented to the ER because of jaw discomfort with chest pressure for 2-3 days. Patient's symptoms usually happen in the night. .  Patient was negative for myocardial infarction he had a LexiScan Myoview 02/07/13 which was negative for ischemia, but due to similarities of his prior coronary disease and the symptoms and multiple risk factors for coronary disease including family history hypertension diabetes known coronary artery disease patient was scheduled for cardiac catheterization with Dr. Allyson Sabal. Revealing 80-90% focal distal LAD disease and a 1.5-1.7 mm vessel left circumflex nondominant with a 90% AV groove stenosis in a small vessel there was a moderate to large obtuse marginal branch with a 60% proximal segmental calcified stenosis. The RCA had widely patent stents. EF was 60%. Due to the small size of the vessels and the negative nuclear stress test that are very filled medical therapy was the patient's best option.  Patient was seen back in followup last week with a heart rate of 48 symptomatic with weakness feeling very tired. He still had some shortness of breath but no jaw pain which is his anginal equivalent. Medications were adjusted because Lopressor actually had him hold it one day and then he was started back half a tablet daily he felt better so he is now resumed half tablet twice a day.  Today's back for followup initially his heart rate was 60 after he walked in the room but after sitting for 10-15 minutes EKG revealed sinus bradycardia rate of 49 but no acute changes otherwise. His blood pressure was improved from his  last visit.  Additionally he denies any shortness of breath he denies any jaw pain.  Since he was seen by [name] 02/24/13 and his beta blockers were adjusted he no longer is bradycardic or symptomatic. He denies chest pain or shortness of breath. I do hear a carotid bruit on exam and cardiac echo Doppler studies on him.  I performed carotid Dopplers on him 04/11/13 which revealed a moderately high-grade left ICA stenosis. He is neurologically asymptomatic. Based on the intermediate nature of his Doppler study is unclear to me whether he has a lesion that can be followed conservatively by ultrasound or requires surgical revascularization. Because of this I decided to proceed with carotid angiography to further define the degree of stenosis.this was performed on 04/25/13 revealing a 95% proximal left internal carotid artery stenosis which suddenly underwent endarterectomy by Dr. [name] on 06/02/13.I also performed angiography on his aortoiliac Arteries revealing 90% calcified right extrailiac artery stenosis.I performed diamondback or rotational atherectomy, PT and stenting of his right external iliac artery on 10/20/13. He had an excellent angiographic and clinical result. His post procedure Dopplers have improved. He did have a small pseudoaneurysm in his right groin which was successfully compressed at the hospital and has remained closed. He has a 2+ right pedal pulse on exam today.   Since I saw him one year ago he's remained stable. We've been following his lower extremity arterial Doppler studies and carotid Doppler studies WHICH have remained stable.   Current Outpatient Prescriptions  Medication Sig Dispense Refill  . amLODipine (NORVASC) 10  MG tablet Take 10 mg by mouth every evening.     [name] atorvastatin (LIPITOR) 40 MG tablet Take 40 mg by mouth daily.    . clopidogrel (PLAVIX) 75 MG tablet Take 1 tablet (75 mg total) by mouth daily. 90 tablet 1  . clopidogrel (PLAVIX) 75 MG tablet TAKE 1 TABLET  (75 MG TOTAL) BY MOUTH DAILY. 90 tablet 0  . Fluticasone-Salmeterol (ADVAIR) 100-50 MCG/DOSE AEPB Inhale 1 puff into the lungs 2 (two) times daily as needed (for shortness of breath).     [name] HYDROcodone-acetaminophen (NORCO) 7.5-325 MG per tablet as needed.   0  . isosorbide mononitrate (IMDUR) 60 MG 24 hr tablet Take 1 tablet (60 mg total) by mouth daily.    [name] losartan-hydrochlorothiazide (HYZAAR) 100-25 MG per tablet Take 1 tablet by mouth every evening.    . metoprolol succinate (TOPROL-XL) 25 MG 24 hr tablet TAKE 1/2 TABLET BY MOUTH DAILY 15 tablet 6  . nitroGLYCERIN (NITROSTAT) 0.4 MG SL tablet Place 0.4 mg under the tongue every 5 (five) minutes as needed for chest pain.    . pantoprazole (PROTONIX) 40 MG tablet Take 40 mg by mouth daily.     Current Facility-Administered Medications  Medication Dose Route Frequency Provider Last Rate Last Dose  . morphine injection 10 mg  10 mg Intravenous Q1H PRN [name], MD      . promethazine (PHENERGAN) injection 25 mg  25 mg Intravenous Q6H PRN [name], MD        Allergies  Allergen Reactions  . Fentanyl Other (See Comments)    Behavioral changes  . Gabapentin     ankle swells  . Lisinopril     unknown  . Lyrica [Pregabalin]     unknown    Social History   Social History  . Marital Status: Married    Spouse Name: N/A  . Number of Children: N/A  . Years of Education: N/A   Occupational History  . Not on file.   Social History Main Topics  . Smoking status: Former Smoker -- 0.75 packs/day for 57 years    Types: Cigarettes    Quit date: 04/06/2013  . Smokeless tobacco: Former Neurosurgeon    Quit date: 04/14/2013     Comment: pt states that he is using the vapor cigs  . Alcohol Use: 0.6 oz/week    1 Glasses of wine per week  . Drug Use: No  . Sexual Activity: Not on file   Other Topics Concern  . Not on file   Social History Narrative     Review of Systems: General: negative for chills, fever, night sweats or  weight changes.  Cardiovascular: negative for chest pain, dyspnea on exertion, edema, orthopnea, palpitations, paroxysmal nocturnal dyspnea or shortness of breath Dermatological: negative for rash Respiratory: negative for cough or wheezing Urologic: negative for hematuria Abdominal: negative for nausea, vomiting, diarrhea, bright red blood per rectum, melena, or hematemesis Neurologic: negative for visual changes, syncope, or dizziness All other systems reviewed and are otherwise negative except as noted above.    Blood pressure 132/74, pulse 68, height 5\' 8"  (1.727 m), weight 237 lb (107.502 kg).  General appearance: alert and no distress Neck: no adenopathy, no carotid bruit, no JVD, supple, symmetrical, trachea midline and thyroid not enlarged, symmetric, no tenderness/mass/nodules Lungs: clear to auscultation bilaterally Heart: regular rate and rhythm, S1, S2 normal, no murmur, click, rub or gallop Extremities: extremities normal, atraumatic, no cyanosis or edema  EKG normal sinus  rhythm at 68 with PACs and short atrial runs. I personally reviewed this EKG  ASSESSMENT AND PLAN:   CAD hx of 2 stents to RCA in 2003,  80-90% stenosis of LAD/CFX June 2014- medical Rx History of CAD status post 2 stents placed to the RCA in 2003 at [facility]. He was catheterized again June 2014 revealing 80-90% focal distal LAD disease and a small vesselas well as a 90% AV groove circumflex stenosis and nondominant vessel with some proximal circumflex obtuse marginal branch disease. The RCA stent was widely patent. EF was normal.The stress test was negative. Medical therapy was recommended. He's had no recurrent symptoms.  Hyperlipidemia History of hyperlipidemia on statin therapy followed by his PCP  HTN (hypertension) History of hypertension blood pressure measured at 132/74. He is on amlodipine, losartan, hydrochlorothiazide and metoprolol. Continue current meds at current dosing.  Carotid  artery disease History of carotid artery disease status post angiography by myself 04/25/13 revealing a 95% proximal left internal carotid artery stenosis. He underwent endarterectomy by Dr. [name] 06/02/13. This was uneventful and his post procedure Dopplers performed 05/29/14 revealed the endarterectomy site to be widely patent.  Claudication in peripheral vascular disease- Rt leg History of peripheral arterial disease status post right lower extremity diamondback orbital rotational atherectomy, PTCA and stenting of the right external iliac artery 10/20/13. His follow-up blood from a Dopplers performed 05/29/14 revealed a normal right ABI with a widely patent stent. He denies claudication.      [name] MD FACP,FACC,FAHA, [facility] 01/22/2016 3:53 PM

## 2016-01-22 NOTE — Assessment & Plan Note (Signed)
History of carotid artery disease status post angiography by myself 04/25/13 revealing a 95% proximal left internal carotid artery stenosis. He underwent endarterectomy by Dr. [name] 06/02/13. This was uneventful and his post procedure Dopplers performed 05/29/14 revealed the endarterectomy site to be widely patent.

## 2016-02-18 ENCOUNTER — Other Ambulatory Visit: Payer: Self-pay | Admitting: Cardiovascular Disease

## 2016-02-18 DIAGNOSIS — I739 Peripheral vascular disease, unspecified: Secondary | ICD-10-CM

## 2016-02-18 DIAGNOSIS — I779 Disorder of arteries and arterioles, unspecified: Secondary | ICD-10-CM

## 2016-02-18 DIAGNOSIS — I700 Atherosclerosis of aorta: Secondary | ICD-10-CM

## 2016-02-18 DIAGNOSIS — I708 Atherosclerosis of other arteries: Principal | ICD-10-CM

## 2016-02-27 ENCOUNTER — Ambulatory Visit (HOSPITAL_COMMUNITY)
Admission: RE | Admit: 2016-02-27 | Discharge: 2016-02-27 | Disposition: A | Discharge status: Home / Self Care | Payer: Medicare Other | Source: Ambulatory Visit | Attending: Cardiovascular Disease | Admitting: Cardiovascular Disease

## 2016-02-27 DIAGNOSIS — E785 Hyperlipidemia, unspecified: Secondary | ICD-10-CM | POA: Diagnosis not present

## 2016-02-27 DIAGNOSIS — K219 Gastro-esophageal reflux disease without esophagitis: Secondary | ICD-10-CM | POA: Diagnosis not present

## 2016-02-27 DIAGNOSIS — I2510 Atherosclerotic heart disease of native coronary artery without angina pectoris: Secondary | ICD-10-CM | POA: Insufficient documentation

## 2016-02-27 DIAGNOSIS — I700 Atherosclerosis of aorta: Secondary | ICD-10-CM | POA: Diagnosis not present

## 2016-02-27 DIAGNOSIS — I6523 Occlusion and stenosis of bilateral carotid arteries: Secondary | ICD-10-CM | POA: Insufficient documentation

## 2016-02-27 DIAGNOSIS — I739 Peripheral vascular disease, unspecified: Secondary | ICD-10-CM | POA: Diagnosis not present

## 2016-02-27 DIAGNOSIS — I708 Atherosclerosis of other arteries: Secondary | ICD-10-CM | POA: Diagnosis not present

## 2016-02-27 DIAGNOSIS — Z720 Tobacco use: Secondary | ICD-10-CM | POA: Insufficient documentation

## 2016-02-27 DIAGNOSIS — I779 Disorder of arteries and arterioles, unspecified: Secondary | ICD-10-CM | POA: Insufficient documentation

## 2016-02-27 DIAGNOSIS — I10 Essential (primary) hypertension: Secondary | ICD-10-CM | POA: Diagnosis not present

## 2016-02-27 DIAGNOSIS — E119 Type 2 diabetes mellitus without complications: Secondary | ICD-10-CM | POA: Diagnosis not present

## 2016-03-05 DIAGNOSIS — Z7984 Long term (current) use of oral hypoglycemic drugs: Secondary | ICD-10-CM | POA: Diagnosis not present

## 2016-03-05 DIAGNOSIS — E119 Type 2 diabetes mellitus without complications: Secondary | ICD-10-CM | POA: Diagnosis not present

## 2016-03-12 ENCOUNTER — Encounter: Payer: Self-pay | Admitting: Cardiovascular Disease

## 2016-03-12 ENCOUNTER — Ambulatory Visit (INDEPENDENT_AMBULATORY_CARE_PROVIDER_SITE_OTHER): Payer: Medicare Other | Admitting: Cardiovascular Disease

## 2016-03-12 VITALS — BP 150/90 | HR 85 | Ht 68.0 in | Wt 236.0 lb

## 2016-03-12 DIAGNOSIS — I2510 Atherosclerotic heart disease of native coronary artery without angina pectoris: Secondary | ICD-10-CM

## 2016-03-12 DIAGNOSIS — I739 Peripheral vascular disease, unspecified: Secondary | ICD-10-CM | POA: Diagnosis not present

## 2016-03-12 NOTE — Patient Instructions (Signed)
Medication Instructions:  Your physician recommends that you continue on your current medications as directed. Please refer to the Current Medication list given to you today.    Testing/Procedures: Your physician has requested that you have an aorta AND Iliac duplex. During this test, an ultrasound is used to evaluate the aorta and the iliac arteries. Do not eat after midnight the day before and avoid carbonated beverages. IN 6 MONTHS.   Follow-Up: Your physician wants you to follow-up in: 6 MONTHS WITH DR Allyson Sabal AFTER YOUR DOPPLER STUDIES. You will receive a reminder letter in the mail two months in advance. If you don't receive a letter, please call our office to schedule the follow-up appointment.   Any Other Special Instructions Will Be Listed Below (If Applicable).     If you need a refill on your cardiac medications before your next appointment, please call your pharmacy.

## 2016-03-12 NOTE — Progress Notes (Signed)
Albert Powell returns today for follow-up of his lower extremity arterial Doppler studies performed 02/27/16. His ABIs have remained normal and his symptoms are minimal although the velocities in both iliac arteries have increased since his previous lower actually actual Doppler study performed 05/29/14. At this point, will continue to follow him noninvasively and clinically.

## 2016-03-12 NOTE — Assessment & Plan Note (Signed)
Mr. Lopezperez returns today for follow-up of his lower extremity arterial Doppler studies performed 02/27/16. His ABIs have remained normal and his symptoms are minimal although the velocities in both iliac arteries have increased since his previous lower actually actual Doppler study performed 05/29/14. At this point, will continue to follow him noninvasively and clinically. 

## 2016-03-27 ENCOUNTER — Other Ambulatory Visit: Payer: Self-pay | Admitting: Cardiovascular Disease

## 2016-06-13 DIAGNOSIS — Z23 Encounter for immunization: Secondary | ICD-10-CM | POA: Diagnosis not present

## 2016-06-22 ENCOUNTER — Other Ambulatory Visit: Payer: Self-pay | Admitting: Cardiovascular Disease

## 2016-06-23 NOTE — Telephone Encounter (Signed)
Refill and medication list clean up of duplicates  

## 2016-06-24 DIAGNOSIS — F325 Major depressive disorder, single episode, in full remission: Secondary | ICD-10-CM | POA: Diagnosis not present

## 2016-06-24 DIAGNOSIS — I739 Peripheral vascular disease, unspecified: Secondary | ICD-10-CM | POA: Diagnosis not present

## 2016-06-24 DIAGNOSIS — Z6836 Body mass index (BMI) 36.0-36.9, adult: Secondary | ICD-10-CM | POA: Diagnosis not present

## 2016-06-24 DIAGNOSIS — J449 Chronic obstructive pulmonary disease, unspecified: Secondary | ICD-10-CM | POA: Diagnosis not present

## 2016-06-24 DIAGNOSIS — E1159 Type 2 diabetes mellitus with other circulatory complications: Secondary | ICD-10-CM | POA: Diagnosis not present

## 2016-06-24 DIAGNOSIS — I6529 Occlusion and stenosis of unspecified carotid artery: Secondary | ICD-10-CM | POA: Diagnosis not present

## 2016-06-24 DIAGNOSIS — Z7984 Long term (current) use of oral hypoglycemic drugs: Secondary | ICD-10-CM | POA: Diagnosis not present

## 2016-06-24 DIAGNOSIS — E1121 Type 2 diabetes mellitus with diabetic nephropathy: Secondary | ICD-10-CM | POA: Diagnosis not present

## 2016-06-24 DIAGNOSIS — I10 Essential (primary) hypertension: Secondary | ICD-10-CM | POA: Diagnosis not present

## 2016-06-24 DIAGNOSIS — I2510 Atherosclerotic heart disease of native coronary artery without angina pectoris: Secondary | ICD-10-CM | POA: Diagnosis not present

## 2016-06-24 DIAGNOSIS — E785 Hyperlipidemia, unspecified: Secondary | ICD-10-CM | POA: Diagnosis not present

## 2016-07-22 DIAGNOSIS — J449 Chronic obstructive pulmonary disease, unspecified: Secondary | ICD-10-CM | POA: Diagnosis not present

## 2016-10-10 DIAGNOSIS — I10 Essential (primary) hypertension: Secondary | ICD-10-CM | POA: Diagnosis not present

## 2016-10-10 DIAGNOSIS — I739 Peripheral vascular disease, unspecified: Secondary | ICD-10-CM | POA: Diagnosis not present

## 2016-10-10 DIAGNOSIS — J449 Chronic obstructive pulmonary disease, unspecified: Secondary | ICD-10-CM | POA: Diagnosis not present

## 2016-10-10 DIAGNOSIS — Z7984 Long term (current) use of oral hypoglycemic drugs: Secondary | ICD-10-CM | POA: Diagnosis not present

## 2016-10-10 DIAGNOSIS — E1159 Type 2 diabetes mellitus with other circulatory complications: Secondary | ICD-10-CM | POA: Diagnosis not present

## 2016-10-20 DIAGNOSIS — E1159 Type 2 diabetes mellitus with other circulatory complications: Secondary | ICD-10-CM | POA: Diagnosis not present

## 2016-10-20 DIAGNOSIS — J449 Chronic obstructive pulmonary disease, unspecified: Secondary | ICD-10-CM | POA: Diagnosis not present

## 2016-10-20 DIAGNOSIS — I10 Essential (primary) hypertension: Secondary | ICD-10-CM | POA: Diagnosis not present

## 2016-10-20 DIAGNOSIS — N289 Disorder of kidney and ureter, unspecified: Secondary | ICD-10-CM | POA: Diagnosis not present

## 2016-10-20 DIAGNOSIS — Z7984 Long term (current) use of oral hypoglycemic drugs: Secondary | ICD-10-CM | POA: Diagnosis not present

## 2016-10-20 DIAGNOSIS — R05 Cough: Secondary | ICD-10-CM | POA: Diagnosis not present

## 2016-12-09 DIAGNOSIS — Z1389 Encounter for screening for other disorder: Secondary | ICD-10-CM | POA: Diagnosis not present

## 2016-12-09 DIAGNOSIS — I6529 Occlusion and stenosis of unspecified carotid artery: Secondary | ICD-10-CM | POA: Diagnosis not present

## 2016-12-09 DIAGNOSIS — E785 Hyperlipidemia, unspecified: Secondary | ICD-10-CM | POA: Diagnosis not present

## 2016-12-09 DIAGNOSIS — Z125 Encounter for screening for malignant neoplasm of prostate: Secondary | ICD-10-CM | POA: Diagnosis not present

## 2016-12-09 DIAGNOSIS — I10 Essential (primary) hypertension: Secondary | ICD-10-CM | POA: Diagnosis not present

## 2016-12-09 DIAGNOSIS — Z0000 Encounter for general adult medical examination without abnormal findings: Secondary | ICD-10-CM | POA: Diagnosis not present

## 2016-12-09 DIAGNOSIS — E1159 Type 2 diabetes mellitus with other circulatory complications: Secondary | ICD-10-CM | POA: Diagnosis not present

## 2016-12-09 DIAGNOSIS — Z1211 Encounter for screening for malignant neoplasm of colon: Secondary | ICD-10-CM | POA: Diagnosis not present

## 2016-12-09 DIAGNOSIS — Z6836 Body mass index (BMI) 36.0-36.9, adult: Secondary | ICD-10-CM | POA: Diagnosis not present

## 2016-12-09 DIAGNOSIS — Z7189 Other specified counseling: Secondary | ICD-10-CM | POA: Diagnosis not present

## 2016-12-09 DIAGNOSIS — R400 Somnolence: Secondary | ICD-10-CM | POA: Diagnosis not present

## 2016-12-09 DIAGNOSIS — Z7984 Long term (current) use of oral hypoglycemic drugs: Secondary | ICD-10-CM | POA: Diagnosis not present

## 2016-12-09 DIAGNOSIS — F325 Major depressive disorder, single episode, in full remission: Secondary | ICD-10-CM | POA: Diagnosis not present

## 2016-12-09 DIAGNOSIS — J449 Chronic obstructive pulmonary disease, unspecified: Secondary | ICD-10-CM | POA: Diagnosis not present

## 2016-12-09 DIAGNOSIS — E1165 Type 2 diabetes mellitus with hyperglycemia: Secondary | ICD-10-CM | POA: Diagnosis not present

## 2016-12-23 ENCOUNTER — Other Ambulatory Visit: Payer: Self-pay | Admitting: Cardiovascular Disease

## 2016-12-24 NOTE — Telephone Encounter (Signed)
Rx(s) sent to pharmacy electronically.  

## 2017-01-27 DIAGNOSIS — R400 Somnolence: Secondary | ICD-10-CM | POA: Diagnosis not present

## 2017-01-27 DIAGNOSIS — R0681 Apnea, not elsewhere classified: Secondary | ICD-10-CM | POA: Diagnosis not present

## 2017-02-04 DIAGNOSIS — G4733 Obstructive sleep apnea (adult) (pediatric): Secondary | ICD-10-CM | POA: Diagnosis not present

## 2017-02-05 DIAGNOSIS — Z7984 Long term (current) use of oral hypoglycemic drugs: Secondary | ICD-10-CM | POA: Diagnosis not present

## 2017-02-05 DIAGNOSIS — I10 Essential (primary) hypertension: Secondary | ICD-10-CM | POA: Diagnosis not present

## 2017-02-05 DIAGNOSIS — I2510 Atherosclerotic heart disease of native coronary artery without angina pectoris: Secondary | ICD-10-CM | POA: Diagnosis not present

## 2017-02-05 DIAGNOSIS — J449 Chronic obstructive pulmonary disease, unspecified: Secondary | ICD-10-CM | POA: Diagnosis not present

## 2017-02-05 DIAGNOSIS — E1159 Type 2 diabetes mellitus with other circulatory complications: Secondary | ICD-10-CM | POA: Diagnosis not present

## 2017-02-05 DIAGNOSIS — G4733 Obstructive sleep apnea (adult) (pediatric): Secondary | ICD-10-CM | POA: Diagnosis not present

## 2017-02-05 DIAGNOSIS — F325 Major depressive disorder, single episode, in full remission: Secondary | ICD-10-CM | POA: Diagnosis not present

## 2017-02-27 ENCOUNTER — Encounter: Payer: Self-pay | Admitting: Cardiovascular Disease

## 2017-02-27 ENCOUNTER — Ambulatory Visit (INDEPENDENT_AMBULATORY_CARE_PROVIDER_SITE_OTHER): Payer: Medicare Other | Admitting: Cardiovascular Disease

## 2017-02-27 VITALS — BP 122/70 | HR 62 | Ht 69.0 in | Wt 244.0 lb

## 2017-02-27 DIAGNOSIS — I779 Disorder of arteries and arterioles, unspecified: Secondary | ICD-10-CM | POA: Diagnosis not present

## 2017-02-27 DIAGNOSIS — I2510 Atherosclerotic heart disease of native coronary artery without angina pectoris: Secondary | ICD-10-CM

## 2017-02-27 DIAGNOSIS — I10 Essential (primary) hypertension: Secondary | ICD-10-CM

## 2017-02-27 DIAGNOSIS — E7800 Pure hypercholesterolemia, unspecified: Secondary | ICD-10-CM

## 2017-02-27 DIAGNOSIS — I739 Peripheral vascular disease, unspecified: Secondary | ICD-10-CM | POA: Diagnosis not present

## 2017-02-27 NOTE — Assessment & Plan Note (Signed)
History of carotid artery disease status post carotid angiography which I performed 04/25/13 revealing a 95% proximal left internal carotid artery stenosis. Ultimate underwent elective left carotid endarterectomy by Dr. [name] 06/02/13. We have been following his carotid Dopplers since that time which most recently were performed 02/27/16 revealing a widely patent endarterectomy site.

## 2017-02-27 NOTE — Assessment & Plan Note (Signed)
History of CAD status post cardiac catheterization May 2014 revealing 80-90% focal distal LAD disease and a 1.5-1.7 mm vessel and 90% AV groove stenosis of a small vessel as well. EF was normal. Due to the size of the vessels was decided to treat him medically especially in light of a negative stress test. He denies chest pain or shortness of breath.

## 2017-02-27 NOTE — Progress Notes (Signed)
02/27/2017 Albert Powell   [birth year]  [id number]  Primary Physician [name], MD Primary Cardiologist: [name] MD [name], [place]  HPI:  7 y/ .o. male with known history of CAD. I last saw 01/22/16.  He is status post stenting with 2 stents to RCA in 2003 and residual LAD, hypertension and hyperlipidemia and diabetes mellitus on diet, presented to the ER because of jaw discomfort with chest pressure for 2-3 days. Patient's symptoms usually happen in the night. .  Patient was negative for myocardial infarction he had a LexiScan Myoview 02/07/13 which was negative for ischemia, but due to similarities of his prior coronary disease and the symptoms and multiple risk factors for coronary disease including family history hypertension diabetes known coronary artery disease patient was scheduled for cardiac catheterization with Dr. Allyson Sabal. Revealing 80-90% focal distal LAD disease and a 1.5-1.7 mm vessel left circumflex nondominant with a 90% AV groove stenosis in a small vessel there was a moderate to large obtuse marginal branch with a 60% proximal segmental calcified stenosis. The RCA had widely patent stents. EF was 60%. Due to the small size of the vessels and the negative nuclear stress test that are very filled medical therapy was the patient's best option.  Patient was seen back in followup last week with a heart rate of 48 symptomatic with weakness feeling very tired. He still had some shortness of breath but no jaw pain which is his anginal equivalent. Medications were adjusted because Lopressor actually had him hold it one day and then he was started back half a tablet daily he felt better so he is now resumed half tablet twice a day.  Today's back for followup initially his heart rate was 60 after he walked in the room but after sitting for 10-15 minutes EKG revealed sinus bradycardia rate of 49 but no acute changes otherwise. His blood pressure was improved from  his last visit.  Additionally he denies any shortness of breath he denies any jaw pain.  Since he was seen by [name] 02/24/13 and his beta blockers were adjusted he no longer is bradycardic or symptomatic. He denies chest pain or shortness of breath. I do hear a carotid bruit on exam and cardiac echo Doppler studies on him.  I performed carotid Dopplers on him 04/11/13 which revealed a moderately high-grade left ICA stenosis. He is neurologically asymptomatic. Based on the intermediate nature of his Doppler study is unclear to me whether he has a lesion that can be followed conservatively by ultrasound or requires surgical revascularization. Because of this I decided to proceed with carotid angiography to further define the degree of stenosis.this was performed on 04/25/13 revealing a 95% proximal left internal carotid artery stenosis which suddenly underwent endarterectomy by Dr. [name] on 06/02/13.I also performed angiography on his aortoiliac Arteries revealing 90% calcified right extrailiac artery stenosis.I performed diamondback or rotational atherectomy, PT and stenting of his right external iliac artery on 10/20/13. He had an excellent angiographic and clinical result. His post procedure Dopplers have improved. He did have a small pseudoaneurysm in his right groin which was successfully compressed at the hospital and has remained closed. He has a 2+ right pedal pulse on exam today.   Since I saw him one year ago he's remained stable. We've been following his lower extremity arterial Doppler studies and carotid Doppler studies WHICH have remained stable. Since I saw him a year ago he's had progressive hip buttock and lower  extremity claudication. His Dopplers performed 02/27/2016 that show moderately high-grade bilateral iliac disease.   Current Outpatient Prescriptions  Medication Sig Dispense Refill  . amLODipine (NORVASC) 10 MG tablet Take 10 mg by mouth every evening.     [name] atorvastatin  (LIPITOR) 40 MG tablet Take 40 mg by mouth daily.    [name] buPROPion (WELLBUTRIN XL) 150 MG 24 hr tablet Take 150 mg by mouth every morning.  3  . citalopram (CELEXA) 40 MG tablet Take 40 mg by mouth daily.  5  . clopidogrel (PLAVIX) 75 MG tablet Take 1 tablet (75 mg total) by mouth daily. 90 tablet 0  . Fluticasone-Salmeterol (ADVAIR) 100-50 MCG/DOSE AEPB Inhale 1 puff into the lungs 2 (two) times daily as needed (for shortness of breath).     [name] glipiZIDE (GLUCOTROL) 5 MG tablet Take 1 tablet by mouth daily.    [name] HYDROcodone-acetaminophen (NORCO) 7.5-325 MG per tablet as needed.   0  . isosorbide mononitrate (IMDUR) 60 MG 24 hr tablet Take 1 tablet (60 mg total) by mouth daily.    [name] JANUVIA 100 MG tablet Take 100 mg by mouth daily.  1  . losartan-hydrochlorothiazide (HYZAAR) 100-25 MG per tablet Take 1 tablet by mouth every evening.    . metoprolol succinate (TOPROL-XL) 25 MG 24 hr tablet TAKE 1/2 TABLET BY MOUTH DAILY 15 tablet 6  . nitroGLYCERIN (NITROSTAT) 0.4 MG SL tablet Place 0.4 mg under the tongue every 5 (five) minutes as needed for chest pain.    . pantoprazole (PROTONIX) 40 MG tablet Take 40 mg by mouth daily.     Current Facility-Administered Medications  Medication Dose Route Frequency Provider Last Rate Last Dose  . morphine injection 10 mg  10 mg Intravenous Q1H PRN [name], MD      . promethazine (PHENERGAN) injection 25 mg  25 mg Intravenous Q6H PRN [name], MD        Allergies  Allergen Reactions  . Fentanyl Other (See Comments)    Behavioral changes  . Gabapentin     ankle swells  . Lisinopril     unknown  . Lyrica [Pregabalin]     unknown    Social History   Social History  . Marital status: Married    Spouse name: N/A  . Number of children: N/A  . Years of education: N/A   Occupational History  . Not on file.   Social History Main Topics  . Smoking status: Former Smoker    Packs/day: 0.75    Years: 57.00    Types: Cigarettes     Quit date: 04/06/2013  . Smokeless tobacco: Former Neurosurgeon    Quit date: 04/14/2013     Comment: pt states that he is using the vapor cigs  . Alcohol use 0.6 oz/week    1 Glasses of wine per week  . Drug use: No  . Sexual activity: Not on file   Other Topics Concern  . Not on file   Social History Narrative  . No narrative on file     Review of Systems: General: negative for chills, fever, night sweats or weight changes.  Cardiovascular: negative for chest pain, dyspnea on exertion, edema, orthopnea, palpitations, paroxysmal nocturnal dyspnea or shortness of breath Dermatological: negative for rash Respiratory: negative for cough or wheezing Urologic: negative for hematuria Abdominal: negative for nausea, vomiting, diarrhea, bright red blood per rectum, melena, or hematemesis Neurologic: negative for visual changes, syncope, or dizziness All other systems reviewed and  are otherwise negative except as noted above.    Blood pressure 122/70, pulse 62, height 5\' 9"  (1.753 m), weight 244 lb (110.7 kg).  General appearance: alert and no distress Neck: no adenopathy, no carotid bruit, no JVD, supple, symmetrical, trachea midline and thyroid not enlarged, symmetric, no tenderness/mass/nodules Lungs: clear to auscultation bilaterally Heart: regular rate and rhythm, S1, S2 normal, no murmur, click, rub or gallop Extremities: extremities normal, atraumatic, no cyanosis or edema  EKG sinus rhythm at 62 with left axis deviation and inferior Q waves. I personally reviewed this EKG.  ASSESSMENT AND PLAN:   CAD hx of 2 stents to RCA in 2003,  80-90% stenosis of LAD/CFX June 2014- medical Rx History of CAD status post cardiac catheterization May 2014 revealing 80-90% focal distal LAD disease and a 1.5-1.7 mm vessel and 90% AV groove stenosis of a small vessel as well. EF was normal. Due to the size of the vessels was decided to treat him medically especially in light of a negative stress test. He  denies chest pain or shortness of breath.  Hyperlipidemia History of hyperlipidemia on statin therapy followed by his PCP  HTN (hypertension) History of essential hypertension with blood pressure measured at 122/70. He is on amlodipine, losartan, hydrochlorothiazide and metoprolol. Continue current meds at current dosing  Carotid artery disease History of carotid artery disease status post carotid angiography which I performed 04/25/13 revealing a 95% proximal left internal carotid artery stenosis. Ultimate underwent elective left carotid endarterectomy by Dr. [name] 06/02/13. We have been following his carotid Dopplers since that time which most recently were performed 02/27/16 revealing a widely patent endarterectomy site.  Peripheral arterial disease History of peripheral arterial disease status post diamondback orbital rotation atherectomy, PT is most recent aortoiliac Dopplers performed 02/27/16 revealed moderately high-grade bilateral iliac disease. His symptoms have become progressive over last year. We will recheck aortoiliac and lower extremity arterial Doppler studies. A and stenting of high-grade calcified right external iliac artery by myself 10/20/13. He had excellent angiographic and clinical result at that time. He does complain now of lifestyle limiting hip and buttock claudication bilaterally which has been progressive over the last year. I am going to repeat lower extremity arterial Doppler and aortoiliac Doppler studies.      [name] MD FACP,FACC,FAHA, [facility] 02/27/2017 3:31 PM

## 2017-02-27 NOTE — Assessment & Plan Note (Signed)
History of essential hypertension with blood pressure measured at 122/70. He is on amlodipine, losartan, hydrochlorothiazide and metoprolol. Continue current meds at current dosing

## 2017-02-27 NOTE — Patient Instructions (Signed)
Medication Instructions: Your physician recommends that you continue on your current medications as directed. Please refer to the Current Medication list given to you today.   Testing/Procedures: Your physician has requested that you have a carotid duplex. This test is an ultrasound of the carotid arteries in your neck. It looks at blood flow through these arteries that supply the brain with blood. Allow one hour for this exam. There are no restrictions or special instructions.  Your physician has requested that you have a lower extremity arterial duplex. During this test, ultrasound is used to evaluate arterial blood flow in the legs. Allow one hour for this exam. There are no restrictions or special instructions.  Your physician has requested that you have a aorta and iliac duplex. During this test, an ultrasound is used to evaluate blood flow to the aorta and iliac arteries. Allow one hour for this exam. Do not eat after midnight the day before and avoid carbonated beverages.   Your physician has requested that you have an ankle brachial index (ABI). During this test an ultrasound and blood pressure cuff are used to evaluate the arteries that supply the arms and legs with blood. Allow thirty minutes for this exam. There are no restrictions or special instructions.  Follow-Up: Your physician wants you to follow-up in: 1 year with Dr. Allyson Sabal. You will receive a reminder letter in the mail two months in advance. If you don't receive a letter, please call our office to schedule the follow-up appointment.  If you need a refill on your cardiac medications before your next appointment, please call your pharmacy.

## 2017-02-27 NOTE — Assessment & Plan Note (Signed)
History of peripheral arterial disease status post diamondback orbital rotation atherectomy, PT is most recent aortoiliac Dopplers performed 02/27/16 revealed moderately high-grade bilateral iliac disease. His symptoms have become progressive over last year. We will recheck aortoiliac and lower extremity arterial Doppler studies. A and stenting of high-grade calcified right external iliac artery by myself 10/20/13. He had excellent angiographic and clinical result at that time. He does complain now of lifestyle limiting hip and buttock claudication bilaterally which has been progressive over the last year. I am going to repeat lower extremity arterial Doppler and aortoiliac Doppler studies.

## 2017-02-27 NOTE — Assessment & Plan Note (Signed)
History of hyperlipidemia on statin therapy followed by his PCP 

## 2017-03-11 DIAGNOSIS — J329 Chronic sinusitis, unspecified: Secondary | ICD-10-CM | POA: Diagnosis not present

## 2017-03-11 DIAGNOSIS — H8113 Benign paroxysmal vertigo, bilateral: Secondary | ICD-10-CM | POA: Diagnosis not present

## 2017-03-11 DIAGNOSIS — E785 Hyperlipidemia, unspecified: Secondary | ICD-10-CM | POA: Diagnosis not present

## 2017-03-25 ENCOUNTER — Other Ambulatory Visit: Payer: Self-pay | Admitting: Internal Medicine

## 2017-03-25 DIAGNOSIS — R42 Dizziness and giddiness: Secondary | ICD-10-CM

## 2017-03-27 ENCOUNTER — Ambulatory Visit
Admission: RE | Admit: 2017-03-27 | Discharge: 2017-03-27 | Disposition: A | Discharge status: Home / Self Care | Payer: Medicare Other | Source: Ambulatory Visit | Attending: Internal Medicine | Admitting: Internal Medicine

## 2017-03-27 DIAGNOSIS — R42 Dizziness and giddiness: Secondary | ICD-10-CM | POA: Diagnosis not present

## 2017-03-28 ENCOUNTER — Other Ambulatory Visit: Payer: Self-pay | Admitting: Cardiovascular Disease

## 2017-04-08 ENCOUNTER — Ambulatory Visit (HOSPITAL_COMMUNITY)
Admission: RE | Admit: 2017-04-08 | Discharge: 2017-04-08 | Disposition: A | Discharge status: Home / Self Care | Payer: Medicare Other | Source: Ambulatory Visit | Attending: Cardiovascular Disease | Admitting: Cardiovascular Disease

## 2017-04-08 ENCOUNTER — Encounter (HOSPITAL_COMMUNITY): Payer: Medicare Other

## 2017-04-08 DIAGNOSIS — I739 Peripheral vascular disease, unspecified: Secondary | ICD-10-CM

## 2017-04-08 DIAGNOSIS — I700 Atherosclerosis of aorta: Secondary | ICD-10-CM | POA: Diagnosis not present

## 2017-04-08 DIAGNOSIS — I2510 Atherosclerotic heart disease of native coronary artery without angina pectoris: Secondary | ICD-10-CM | POA: Insufficient documentation

## 2017-04-08 DIAGNOSIS — I70293 Other atherosclerosis of native arteries of extremities, bilateral legs: Secondary | ICD-10-CM | POA: Diagnosis not present

## 2017-04-08 DIAGNOSIS — I779 Disorder of arteries and arterioles, unspecified: Secondary | ICD-10-CM

## 2017-04-08 DIAGNOSIS — E1151 Type 2 diabetes mellitus with diabetic peripheral angiopathy without gangrene: Secondary | ICD-10-CM | POA: Diagnosis not present

## 2017-04-08 DIAGNOSIS — E785 Hyperlipidemia, unspecified: Secondary | ICD-10-CM | POA: Insufficient documentation

## 2017-04-08 DIAGNOSIS — Z87891 Personal history of nicotine dependence: Secondary | ICD-10-CM | POA: Diagnosis not present

## 2017-04-08 DIAGNOSIS — I6523 Occlusion and stenosis of bilateral carotid arteries: Secondary | ICD-10-CM | POA: Diagnosis not present

## 2017-04-08 DIAGNOSIS — I10 Essential (primary) hypertension: Secondary | ICD-10-CM | POA: Insufficient documentation

## 2017-04-09 DIAGNOSIS — R42 Dizziness and giddiness: Secondary | ICD-10-CM | POA: Diagnosis not present

## 2017-04-10 ENCOUNTER — Other Ambulatory Visit: Payer: Self-pay

## 2017-04-10 DIAGNOSIS — I739 Peripheral vascular disease, unspecified: Secondary | ICD-10-CM

## 2017-04-14 ENCOUNTER — Encounter: Payer: Self-pay | Admitting: Cardiovascular Disease

## 2017-04-14 ENCOUNTER — Ambulatory Visit (INDEPENDENT_AMBULATORY_CARE_PROVIDER_SITE_OTHER): Payer: Medicare Other | Admitting: Cardiovascular Disease

## 2017-04-14 DIAGNOSIS — I2510 Atherosclerotic heart disease of native coronary artery without angina pectoris: Secondary | ICD-10-CM | POA: Diagnosis not present

## 2017-04-14 DIAGNOSIS — I739 Peripheral vascular disease, unspecified: Secondary | ICD-10-CM

## 2017-04-14 NOTE — Progress Notes (Signed)
Albert Powell had diamondback orbital rotational atherectomy, PTA and stenting of his right external iliac artery 10/20/13 with excellent angiographic and clinical result. We've been following his 2+ ultrasound on an annual basis. This was most recently done 04/08/17 which revealed no change compared to the prior study performed one year ago. I do not think his current symptoms are related to vascular insufficiency. He may be neurologically mediated from his spine disease. We'll continue to follow his Dopplers are annual basis. 

## 2017-04-14 NOTE — Assessment & Plan Note (Signed)
Albert Powell had diamondback orbital rotational atherectomy, PTA and stenting of his right external iliac artery 10/20/13 with excellent angiographic and clinical result. We've been following his 2+ ultrasound on an annual basis. This was most recently done 04/08/17 which revealed no change compared to the prior study performed one year ago. I do not think his current symptoms are related to vascular insufficiency. He may be neurologically mediated from his spine disease. We'll continue to follow his Dopplers are annual basis.

## 2017-04-14 NOTE — Patient Instructions (Signed)
Medication Instructions: Your physician recommends that you continue on your current medications as directed. Please refer to the Current Medication list given to you today.   Testing/Procedures: Your physician has requested that you have a lower extremity arterial duplex. During this test, ultrasound is used to evaluate arterial blood flow in the legs. Allow one hour for this exam. There are no restrictions or special instructions.  Your physician has requested that you have an ankle brachial index (ABI). During this test an ultrasound and blood pressure cuff are used to evaluate the arteries that supply the arms and legs with blood. Allow thirty minutes for this exam. There are no restrictions or special instructions.  (Currently scheduled for 04/12/18)   Follow-Up: Your physician wants you to follow-up in: 1 year with Dr. Gerrie Nordmann dopplers) You will receive a reminder letter in the mail two months in advance. If you don't receive a letter, please call our office to schedule the follow-up appointment.  If you need a refill on your cardiac medications before your next appointment, please call your pharmacy.

## 2017-05-08 DIAGNOSIS — H52203 Unspecified astigmatism, bilateral: Secondary | ICD-10-CM | POA: Diagnosis not present

## 2017-05-08 DIAGNOSIS — E119 Type 2 diabetes mellitus without complications: Secondary | ICD-10-CM | POA: Diagnosis not present

## 2017-05-08 DIAGNOSIS — H2513 Age-related nuclear cataract, bilateral: Secondary | ICD-10-CM | POA: Diagnosis not present

## 2017-06-10 DIAGNOSIS — J209 Acute bronchitis, unspecified: Secondary | ICD-10-CM | POA: Diagnosis not present

## 2017-06-10 DIAGNOSIS — E1165 Type 2 diabetes mellitus with hyperglycemia: Secondary | ICD-10-CM | POA: Diagnosis not present

## 2017-06-10 DIAGNOSIS — Z7984 Long term (current) use of oral hypoglycemic drugs: Secondary | ICD-10-CM | POA: Diagnosis not present

## 2017-06-10 DIAGNOSIS — E1159 Type 2 diabetes mellitus with other circulatory complications: Secondary | ICD-10-CM | POA: Diagnosis not present

## 2017-06-10 DIAGNOSIS — F4323 Adjustment disorder with mixed anxiety and depressed mood: Secondary | ICD-10-CM | POA: Diagnosis not present

## 2017-06-10 DIAGNOSIS — I10 Essential (primary) hypertension: Secondary | ICD-10-CM | POA: Diagnosis not present

## 2017-06-24 DIAGNOSIS — E1159 Type 2 diabetes mellitus with other circulatory complications: Secondary | ICD-10-CM | POA: Diagnosis not present

## 2017-06-24 DIAGNOSIS — J441 Chronic obstructive pulmonary disease with (acute) exacerbation: Secondary | ICD-10-CM | POA: Diagnosis not present

## 2017-06-24 DIAGNOSIS — Z7984 Long term (current) use of oral hypoglycemic drugs: Secondary | ICD-10-CM | POA: Diagnosis not present

## 2017-07-07 DIAGNOSIS — Z23 Encounter for immunization: Secondary | ICD-10-CM | POA: Diagnosis not present

## 2017-09-25 DIAGNOSIS — F325 Major depressive disorder, single episode, in full remission: Secondary | ICD-10-CM | POA: Diagnosis not present

## 2017-09-25 DIAGNOSIS — R5383 Other fatigue: Secondary | ICD-10-CM | POA: Diagnosis not present

## 2017-09-25 DIAGNOSIS — I10 Essential (primary) hypertension: Secondary | ICD-10-CM | POA: Diagnosis not present

## 2017-09-25 DIAGNOSIS — E1159 Type 2 diabetes mellitus with other circulatory complications: Secondary | ICD-10-CM | POA: Diagnosis not present

## 2017-09-29 ENCOUNTER — Other Ambulatory Visit: Payer: Self-pay | Admitting: Cardiovascular Disease

## 2017-11-16 DIAGNOSIS — J209 Acute bronchitis, unspecified: Secondary | ICD-10-CM | POA: Diagnosis not present

## 2017-12-14 DIAGNOSIS — J449 Chronic obstructive pulmonary disease, unspecified: Secondary | ICD-10-CM | POA: Diagnosis not present

## 2017-12-14 DIAGNOSIS — J209 Acute bronchitis, unspecified: Secondary | ICD-10-CM | POA: Diagnosis not present

## 2017-12-14 DIAGNOSIS — F325 Major depressive disorder, single episode, in full remission: Secondary | ICD-10-CM | POA: Diagnosis not present

## 2017-12-14 DIAGNOSIS — Z1389 Encounter for screening for other disorder: Secondary | ICD-10-CM | POA: Diagnosis not present

## 2017-12-14 DIAGNOSIS — G4733 Obstructive sleep apnea (adult) (pediatric): Secondary | ICD-10-CM | POA: Diagnosis not present

## 2017-12-14 DIAGNOSIS — M5441 Lumbago with sciatica, right side: Secondary | ICD-10-CM | POA: Diagnosis not present

## 2017-12-14 DIAGNOSIS — Z23 Encounter for immunization: Secondary | ICD-10-CM | POA: Diagnosis not present

## 2017-12-14 DIAGNOSIS — Z0000 Encounter for general adult medical examination without abnormal findings: Secondary | ICD-10-CM | POA: Diagnosis not present

## 2017-12-14 DIAGNOSIS — M5442 Lumbago with sciatica, left side: Secondary | ICD-10-CM | POA: Diagnosis not present

## 2017-12-14 DIAGNOSIS — G8929 Other chronic pain: Secondary | ICD-10-CM | POA: Diagnosis not present

## 2017-12-14 DIAGNOSIS — E1121 Type 2 diabetes mellitus with diabetic nephropathy: Secondary | ICD-10-CM | POA: Diagnosis not present

## 2017-12-14 DIAGNOSIS — Z7189 Other specified counseling: Secondary | ICD-10-CM | POA: Diagnosis not present

## 2017-12-14 DIAGNOSIS — Z6836 Body mass index (BMI) 36.0-36.9, adult: Secondary | ICD-10-CM | POA: Diagnosis not present

## 2017-12-15 DIAGNOSIS — M545 Low back pain: Secondary | ICD-10-CM | POA: Diagnosis not present

## 2017-12-15 DIAGNOSIS — M48061 Spinal stenosis, lumbar region without neurogenic claudication: Secondary | ICD-10-CM | POA: Diagnosis not present

## 2017-12-15 DIAGNOSIS — M47817 Spondylosis without myelopathy or radiculopathy, lumbosacral region: Secondary | ICD-10-CM | POA: Diagnosis not present

## 2017-12-21 ENCOUNTER — Other Ambulatory Visit: Payer: Self-pay | Admitting: Orthopedic Surgery

## 2017-12-21 DIAGNOSIS — M5416 Radiculopathy, lumbar region: Secondary | ICD-10-CM

## 2017-12-21 DIAGNOSIS — M5136 Other intervertebral disc degeneration, lumbar region: Secondary | ICD-10-CM | POA: Insufficient documentation

## 2017-12-22 ENCOUNTER — Other Ambulatory Visit: Payer: Self-pay | Admitting: Orthopedic Surgery

## 2017-12-22 DIAGNOSIS — M5416 Radiculopathy, lumbar region: Secondary | ICD-10-CM

## 2017-12-23 ENCOUNTER — Telehealth: Payer: Self-pay

## 2017-12-23 NOTE — Telephone Encounter (Signed)
   [facility] Pre-operative Risk Assessment    Request for surgical clearance:  1. What type of surgery is being performed? Myelogram  2. When is this surgery scheduled? TBD  3. What type of clearance is required (medical clearance vs. Pharmacy clearance to hold med vs. Both)? Pharmacy  4. Are there any medications that need to be held prior to surgery and how long? Plavix  5. Practice name and name of physician performing surgery? [facility]   6. What is your office phone number [phone]   7.   What is your office fax number [phone]  8.   Anesthesia type (None, local, MAC, general) ? Karie Soda 12/23/2017, 4:56 PM  _________________________________________________________________   (provider comments below)

## 2017-12-23 NOTE — Progress Notes (Signed)
Phone call to patient to verify medication list and allergies for myelogram procedure. Pt informed to stop Celexa 48 hrs prior to myelogram appointment and Plavix 5 days prior to appointment, pending cardiologist approval. Pt verbalized understanding.

## 2017-12-24 NOTE — Telephone Encounter (Signed)
   Primary Cardiologist:Jonathan Allyson Sabal, MD  Chart reviewed as part of pre-operative protocol coverage. Because of Albert Powell's past medical history and time since last visit, he/she will require a follow-up visit in order to better assess preoperative cardiovascular risk.  This will also be routed to Dr. Allyson Sabal to make a recommendation concerning holding plavix for his myelogram.  Pre-op covering staff: - Please schedule appointment and call patient to inform them. - Please contact requesting surgeon's office via preferred method (i.e, phone, fax) to inform them of need for appointment prior to surgery.  [name], PA  12/24/2017, 2:13 PM

## 2017-12-24 NOTE — Telephone Encounter (Signed)
Spoke with pts wife, [place], [place] on file.  She has been made aware that pt would need to be seen before he could be cleared for his Myelogram. Pt has been scheduled to see Joni Reining, NP @ [facility] 12/31/17. Mrs. Chaddock thanked me for the call.

## 2017-12-25 DIAGNOSIS — M5442 Lumbago with sciatica, left side: Secondary | ICD-10-CM | POA: Diagnosis not present

## 2017-12-25 DIAGNOSIS — E1121 Type 2 diabetes mellitus with diabetic nephropathy: Secondary | ICD-10-CM | POA: Diagnosis not present

## 2017-12-30 NOTE — Progress Notes (Deleted)
Cardiology Office Note   Date:  12/30/2017   ID:  Albert Powell, Albert Powell 07-May-1941, MRN [id number]  PCP:  [name], MD  Cardiologist:  Dr. Allyson Sabal   No chief complaint on file.    History of Present Illness: Albert Powell is a 77 y.o. male who presents for ongoing assessment and management of CAD, with known history of stenting to the right coronary artery x2 in 2003 with residual LAD disease.  Patient also has a history of hypertension, hyperlipidemia, and diet-controlled diabetes.  The patient had repeat cardiac catheterization in May 2014 revealing an 80% to 90% focal distal LAD disease and a 1.5-4.7 mm vessel left circumflex nondominant with 90% AV groove stenosis in the proximal segment with calcified stenosis.  The RCA did have widely patent stents.  Due to small size of vessels and negative nuclear stress test, patient was planned for medical therapy.  The patient also had carotid artery disease with carotid angiography revealing a 95% proximal left internal carotid artery stenosis and underwent endarterectomy by Dr. [name] on 06/02/2013.  Angiography of his aortoiliac arteries revealed a 90% calcified right external iliac artery stenosis Dr. Gery Pray performed a diamondback rotational atherectomy, PTCA and stenting of the right external iliac artery on 10/20/2013 with excellent results.    Past Medical History:  Diagnosis Date  . Bradycardia, sinus 02/18/2013  . CAD (coronary artery disease)    stent to RCA 2003 also had 40% lesion at that time; NUCLEAR STRESS TEST, 01/16/2010 - normal  now with cath 80-90% stenosis in LAD, will try medical therapy if no improvement PCI  . Carotid artery disease ([name])   . Cataract   . Claudication ([name])    LEA DUPLEX, 07/07/2008 - Normal  . COPD (chronic obstructive pulmonary disease) ([name])   . DDD (degenerative disc disease) 2008  . Diabetes mellitus   . GERD (gastroesophageal reflux disease)   . H/O hiatal hernia   . History of  colonoscopy 2004   finding of tics and AMV only no polpys  . Hyperlipidemia 02/05/2013  . Hypertension   . Onychomycosis   . Rosacea   . Tobacco abuse 02/05/2013    Past Surgical History:  Procedure Laterality Date  . APPENDECTOMY    . CARDIAC CATHETERIZATION  08/29/2002   2-vessel CAD with high-grade stenoses in RCA; stenting to RCA  . CARDIAC CATHETERIZATION  2014   80-90% lesion will try medical therapy  . CARDIAC SURGERY     stents  2003  . CARDIOVERSION N/A 11/01/2013   Procedure: Dimple Nanas COMPRESSION;  Surgeon: [name], MD;  Location: [facility] CATH LAB;  Service: Cardiovascular;  Laterality: N/A;  . CAROTID ANGIOGRAM N/A 04/25/2013   Procedure: CAROTID ANGIOGRAM;  Surgeon: [name], MD;  Location: [facility] CATH LAB;  Service: Cardiovascular;  Laterality: N/A;  . CAROTID ENDARTERECTOMY    . COLON RESECTION  3/04, 6/04    1 bleeding diverticulitis, 2 complete colectomy  . COLON SURGERY  2005   renal pouch rectal anastimosis  . CORONARY ANGIOPLASTY WITH STENT PLACEMENT  09/2002   2 stents to RCA  . ENDARTERECTOMY Left 06/02/2013   Procedure: ENDARTERECTOMY CAROTID-LEFT;  Surgeon: Nada Libman, MD;  Location: [facility] OR;  Service: Vascular;  Laterality: Left;  . LEFT HEART CATHETERIZATION WITH CORONARY ANGIOGRAM N/A 02/08/2013   Procedure: LEFT HEART CATHETERIZATION WITH CORONARY ANGIOGRAM;  Surgeon: [name], MD;  Location: [facility] CATH LAB;  Service: Cardiovascular;  Laterality: N/A;  . LOWER EXTREMITY ANGIOGRAM  N/A 10/20/2013   Procedure: LOWER EXTREMITY ANGIOGRAM;  Surgeon: [name], MD;  Location: [facility] CATH LAB;  Service: Cardiovascular;  Laterality: N/A;  . PATCH ANGIOPLASTY Left 06/02/2013   Procedure: PATCH ANGIOPLASTY;  Surgeon: Nada Libman, MD;  Location: [facility] OR;  Service: Vascular;  Laterality: Left;  . TONSILLECTOMY       Current Outpatient Medications  Medication Sig Dispense Refill  . amLODipine (NORVASC) 10 MG tablet Take 10 mg by mouth every  evening.     [name] atorvastatin (LIPITOR) 40 MG tablet Take 40 mg by mouth daily.    [name] buPROPion (WELLBUTRIN XL) 150 MG 24 hr tablet Take 150 mg by mouth every morning.  3  . citalopram (CELEXA) 40 MG tablet Take 40 mg by mouth daily.  5  . clopidogrel (PLAVIX) 75 MG tablet TAKE 1 TABLET (75 MG TOTAL) BY MOUTH DAILY. 90 tablet 2  . Fluticasone-Salmeterol (ADVAIR) 100-50 MCG/DOSE AEPB Inhale 1 puff into the lungs 2 (two) times daily as needed (for shortness of breath).     [name] glipiZIDE (GLUCOTROL) 5 MG tablet Take 1 tablet by mouth daily.    [name] HYDROcodone-acetaminophen (NORCO) 7.5-325 MG per tablet as needed.   0  . isosorbide mononitrate (IMDUR) 60 MG 24 hr tablet Take 1 tablet (60 mg total) by mouth daily.    [name] JANUVIA 100 MG tablet Take 100 mg by mouth daily.  1  . losartan-hydrochlorothiazide (HYZAAR) 100-25 MG per tablet Take 1 tablet by mouth every evening.    . metoprolol succinate (TOPROL-XL) 25 MG 24 hr tablet TAKE 1/2 TABLET BY MOUTH DAILY (Patient not taking: Reported on 12/23/2017) 15 tablet 6  . nitroGLYCERIN (NITROSTAT) 0.4 MG SL tablet Place 0.4 mg under the tongue every 5 (five) minutes as needed for chest pain.    . pantoprazole (PROTONIX) 40 MG tablet Take 40 mg by mouth daily.    . ranitidine (ZANTAC) 150 MG tablet Take 150 mg by mouth 2 (two) times daily.     Current Facility-Administered Medications  Medication Dose Route Frequency Provider Last Rate Last Dose  . morphine injection 10 mg  10 mg Intravenous Q1H PRN [name], MD      . promethazine (PHENERGAN) injection 25 mg  25 mg Intravenous Q6H PRN [name], MD        Allergies:   Fentanyl; Gabapentin; Lisinopril; Lyrica [pregabalin]; and Metformin    Social History:  The patient  reports that he quit smoking about 4 years ago. His smoking use included cigarettes. He has a 42.75 pack-year smoking history. He quit smokeless tobacco use about 4 years ago. He reports that he drinks about 0.6 oz of alcohol per  week. He reports that he does not use drugs.   Family History:  The patient's family history includes COPD in his mother; Colonic polyp in his mother and sister; Diabetes in his mother; Heart attack in his father and paternal grandfather; Heart disease in his father and sister; Hyperlipidemia in his father; Stroke in his maternal grandfather.    ROS: All other systems are reviewed and negative. Unless otherwise mentioned in H&P    PHYSICAL EXAM: VS:  There were no vitals taken for this visit. , BMI There is no height or weight on file to calculate BMI. GEN: Well nourished, well developed, in no acute distress  HEENT: normal  Neck: no JVD, carotid bruits, or masses Cardiac: ***RRR; no murmurs, rubs, or gallops,no edema  Respiratory:  clear to auscultation  bilaterally, normal work of breathing GI: soft, nontender, nondistended, + BS MS: no deformity or atrophy  Skin: warm and dry, no rash Neuro:  Strength and sensation are intact Psych: euthymic mood, full affect   EKG:  EKG {ACTION; IS/IS [id number]} ordered today. The ekg ordered today demonstrates ***   Recent Labs: No results found for requested labs within last 8760 hours.    Lipid Panel No results found for: CHOL, TRIG, HDL, CHOLHDL, VLDL, LDLCALC, LDLDIRECT    Wt Readings from Last 3 Encounters:  04/14/17 245 lb (111.1 kg)  02/27/17 244 lb (110.7 kg)  03/12/16 236 lb (107 kg)      Other studies Reviewed: Additional studies/ records that were reviewed today include: ***. Review of the above records demonstrates: ***   ASSESSMENT AND PLAN:  1.  ***   Current medicines are reviewed at length with the patient today.    Labs/ tests ordered today include: *** Albert Powell, Albert Powell, Albert Powell   12/30/2017 1:22 PM    [facility] [address] [place], [place] [address] Phone: [phone]; Fax: [phone]

## 2017-12-31 ENCOUNTER — Ambulatory Visit: Payer: Medicare Other | Admitting: Adult Health

## 2018-01-11 DIAGNOSIS — J441 Chronic obstructive pulmonary disease with (acute) exacerbation: Secondary | ICD-10-CM | POA: Diagnosis not present

## 2018-01-11 DIAGNOSIS — E1159 Type 2 diabetes mellitus with other circulatory complications: Secondary | ICD-10-CM | POA: Diagnosis not present

## 2018-01-11 DIAGNOSIS — Z7984 Long term (current) use of oral hypoglycemic drugs: Secondary | ICD-10-CM | POA: Diagnosis not present

## 2018-01-11 DIAGNOSIS — I2510 Atherosclerotic heart disease of native coronary artery without angina pectoris: Secondary | ICD-10-CM | POA: Diagnosis not present

## 2018-01-11 DIAGNOSIS — E785 Hyperlipidemia, unspecified: Secondary | ICD-10-CM | POA: Diagnosis not present

## 2018-01-11 DIAGNOSIS — F325 Major depressive disorder, single episode, in full remission: Secondary | ICD-10-CM | POA: Diagnosis not present

## 2018-01-11 DIAGNOSIS — I10 Essential (primary) hypertension: Secondary | ICD-10-CM | POA: Diagnosis not present

## 2018-01-15 ENCOUNTER — Encounter: Payer: Self-pay | Admitting: Cardiovascular Disease

## 2018-01-15 ENCOUNTER — Encounter

## 2018-01-15 ENCOUNTER — Ambulatory Visit (INDEPENDENT_AMBULATORY_CARE_PROVIDER_SITE_OTHER): Payer: Medicare Other | Admitting: Cardiovascular Disease

## 2018-01-15 VITALS — BP 100/66 | HR 65 | Ht 69.0 in | Wt 245.0 lb

## 2018-01-15 DIAGNOSIS — I10 Essential (primary) hypertension: Secondary | ICD-10-CM

## 2018-01-15 DIAGNOSIS — I6522 Occlusion and stenosis of left carotid artery: Secondary | ICD-10-CM | POA: Diagnosis not present

## 2018-01-15 DIAGNOSIS — I739 Peripheral vascular disease, unspecified: Secondary | ICD-10-CM

## 2018-01-15 NOTE — Assessment & Plan Note (Signed)
History of carotid artery disease status post uncomplicated elective left carotid endarterectomy performed by Dr. [name] at my request after angiography revealed a 95% proximal left ICA stenosis on 06/02/13. We followed this noninvasively on an annual basis. His last Doppler performed 04/08/17 revealed widely patent carotid arteries.

## 2018-01-15 NOTE — Patient Instructions (Signed)
Medication Instructions: Your physician recommends that you continue on your current medications as directed. Please refer to the Current Medication list given to you today.   Testing/Procedures: Your physician has requested that you have a carotid duplex. This test is an ultrasound of the carotid arteries in your neck. It looks at blood flow through these arteries that supply the brain with blood. Allow one hour for this exam. There are no restrictions or special instructions.  Your physician has requested that you have a lower extremity arterial duplex. During this test, ultrasound is used to evaluate arterial blood flow in the legs. Allow one hour for this exam. There are no restrictions or special instructions.  Your physician has requested that you have an ankle brachial index (ABI). During this test an ultrasound and blood pressure cuff are used to evaluate the arteries that supply the arms and legs with blood. Allow thirty minutes for this exam. There are no restrictions or special instructions.  Follow-Up: Your physician wants you to follow-up in: 1 year with Dr. Berry. You will receive a reminder letter in the mail two months in advance. If you don't receive a letter, please call our office to schedule the follow-up appointment.  If you need a refill on your cardiac medications before your next appointment, please call your pharmacy.  

## 2018-01-15 NOTE — Assessment & Plan Note (Signed)
History of CAD status post RCA stenting by myself in 2003 with residual LAD disease. I catheterized him in 2014 revealing an 80-90% focal distal LAD stenosis and a small vessel, nondominant circumflex with a 90% mid AV groove stenosis which was a small vessel as well and a moderate to large marginal branch with a 60% proximal segmental calcified stenosis. The RCA was widely patent. EF was normal. He denies chest pain or shortness of breath.

## 2018-01-15 NOTE — Assessment & Plan Note (Signed)
History of hyperlipidemia on statin therapy. 

## 2018-01-15 NOTE — Assessment & Plan Note (Signed)
History of peripheral arterial disease status post diamondback orbital rotational atherectomy, PTA and stenting of his right external iliac artery by myself 10/20/13. He had excellent angiographic result. His most recent Dopplers performed 04/08/17 revealed a right ABI of 0.85 and a left appointment 96 with moderate disease in his right iliac. He does complain of back and hip pain with ambulation and has been told that he has spinal stenosis and needs decompressive laminectomy. I'm going to repeat his arterial Dopplers to ensure that this is not vascular in nature and if unchanged clear him at low risk for his upcoming orthopedic procedure.

## 2018-01-15 NOTE — Progress Notes (Signed)
01/15/2018 Albert Powell   1941-09-03  [id number]  Primary Physician [name], MD Primary Cardiologist: [name] MD [name], [place], [place]  HPI:  Albert Powell is a 77 y.o. moderately overweight married Caucasian male with known history of CAD. I last saw 02/27/17. He is status post stenting with 2 stents to RCA in 2003 and residual LAD, hypertension and hyperlipidemia and diabetes mellitus on diet, presented to the ER because of jaw discomfort with chest pressure for 2-3 days. Patient's symptoms usually happen in the night. .  Patient was negative for myocardial infarction he had a LexiScan Myoview 02/07/13 which was negative for ischemia, but due to similarities of his prior coronary disease and the symptoms and multiple risk factors for coronary disease including family history hypertension diabetes known coronary artery disease patient was scheduled for cardiac catheterization with Dr. Allyson Sabal. Revealing 80-90% focal distal LAD disease and a 1.5-1.7 mm vessel left circumflex nondominant with a 90% AV groove stenosis in a small vessel there was a moderate to large obtuse marginal branch with a 60% proximal segmental calcified stenosis. The RCA had widely patent stents. EF was 60%. Due to the small size of the vessels and the negative nuclear stress test that are very filled medical therapy was the patient's best option.  Patient was seen back in followup last week with a heart rate of 48 symptomatic with weakness feeling very tired. He still had some shortness of breath but no jaw pain which is his anginal equivalent. Medications were adjusted because Lopressor actually had him hold it one day and then he was started back half a tablet daily he felt better so he is now resumed half tablet twice a day.  Today's back for followup initially his heart rate was 60 after he walked in the room but after sitting for 10-15 minutes EKG revealed sinus bradycardia rate of 49 but no  acute changes otherwise. His blood pressure was improved from his last visit.  Additionally he denies any shortness of breath he denies any jaw pain.  Since he was seen by [name] 02/24/13 and his beta blockers were adjusted he no longer is bradycardic or symptomatic. He denies chest pain or shortness of breath. I do hear a carotid bruit on exam and cardiac echo Doppler studies on him.  I performed carotid Dopplers on him 04/11/13 which revealed a moderately high-grade left ICA stenosis. He is neurologically asymptomatic. Based on the intermediate nature of his Doppler study is unclear to me whether he has a lesion that can be followed conservatively by ultrasound or requires surgical revascularization. Because of this I decided to proceed with carotid angiography to further define the degree of stenosis.this was performed on 04/25/13 revealing a 95% proximal left internal carotid artery stenosis which suddenly underwent endarterectomy by Dr. [name] on 06/02/13.I also performed angiography on his aortoiliac Arteries revealing 90% calcified right extrailiac artery stenosis.I performed diamondback or rotational atherectomy, PT and stenting of his right external iliac artery on 10/20/13. He had an excellent angiographic and clinical result. His post procedure Dopplers have improved. He did have a small pseudoaneurysm in his right groin which was successfully compressed at the hospital and has remained closed.  Since I saw me year ago his major complaints have been lifestyle limiting back and hip pain. He's had imaging studies that suggest spinal stenosis and wishes to undergo decompressive laminectomy by Dr. Shon Baton in the upcoming future.. He had lower extremity Dopplers performed in  our office 04/08/17 that showed a right ABI of 0.85 and a left aBI of 0.96.     Current Meds  Medication Sig  . amLODipine (NORVASC) 10 MG tablet Take 10 mg by mouth every evening.   [name] atorvastatin (LIPITOR) 40 MG tablet Take 40  mg by mouth daily.  . clopidogrel (PLAVIX) 75 MG tablet TAKE 1 TABLET (75 MG TOTAL) BY MOUTH DAILY.  [name] Fluticasone-Salmeterol (ADVAIR) 100-50 MCG/DOSE AEPB Inhale 1 puff into the lungs 2 (two) times daily as needed (for shortness of breath).   [name] glipiZIDE (GLUCOTROL) 5 MG tablet Take 1 tablet by mouth daily.  [name] HYDROcodone-acetaminophen (NORCO) 7.5-325 MG per tablet as needed.   . isosorbide mononitrate (IMDUR) 60 MG 24 hr tablet Take 1 tablet (60 mg total) by mouth daily.  [name] losartan-hydrochlorothiazide (HYZAAR) 100-25 MG per tablet Take 1 tablet by mouth every evening.  . metoprolol succinate (TOPROL-XL) 25 MG 24 hr tablet TAKE 1/2 TABLET BY MOUTH DAILY  . nitroGLYCERIN (NITROSTAT) 0.4 MG SL tablet Place 0.4 mg under the tongue every 5 (five) minutes as needed for chest pain.  . pantoprazole (PROTONIX) 40 MG tablet Take 40 mg by mouth daily.  . [DISCONTINUED] buPROPion (WELLBUTRIN XL) 150 MG 24 hr tablet Take 150 mg by mouth every morning.  . [DISCONTINUED] citalopram (CELEXA) 40 MG tablet Take 40 mg by mouth daily.  . [DISCONTINUED] JANUVIA 100 MG tablet Take 100 mg by mouth daily.  . [DISCONTINUED] ranitidine (ZANTAC) 150 MG tablet Take 150 mg by mouth 2 (two) times daily.   Current Facility-Administered Medications for the 01/15/18 encounter (Office Visit) with [name], MD  Medication  . morphine injection 10 mg  . promethazine (PHENERGAN) injection 25 mg     Allergies  Allergen Reactions  . Fentanyl Other (See Comments)    Behavioral changes  . Gabapentin     ankle swells  . Lisinopril     unknown  . Lyrica [Pregabalin]     unknown  . Metformin Diarrhea    Social History   Socioeconomic History  . Marital status: Married    Spouse name: Not on file  . Number of children: Not on file  . Years of education: Not on file  . Highest education level: Not on file  Occupational History  . Not on file  Social Needs  . Financial resource strain: Not on file  . Food  insecurity:    Worry: Not on file    Inability: Not on file  . Transportation needs:    Medical: Not on file    Non-medical: Not on file  Tobacco Use  . Smoking status: Former Smoker    Packs/day: 0.75    Years: 57.00    Pack years: 42.75    Types: Cigarettes    Last attempt to quit: 04/06/2013    Years since quitting: 4.7  . Smokeless tobacco: Former Neurosurgeon    Quit date: 04/14/2013  . Tobacco comment: pt states that he is using the vapor cigs  Substance and Sexual Activity  . Alcohol use: Yes    Alcohol/week: 0.6 oz    Types: 1 Glasses of wine per week  . Drug use: No  . Sexual activity: Not on file  Lifestyle  . Physical activity:    Days per week: Not on file    Minutes per session: Not on file  . Stress: Not on file  Relationships  . Social connections:    Talks on phone: Not on  file    Gets together: Not on file    Attends religious service: Not on file    Active member of club or organization: Not on file    Attends meetings of clubs or organizations: Not on file    Relationship status: Not on file  . Intimate partner violence:    Fear of current or ex partner: Not on file    Emotionally abused: Not on file    Physically abused: Not on file    Forced sexual activity: Not on file  Other Topics Concern  . Not on file  Social History Narrative  . Not on file     Review of Systems: General: negative for chills, fever, night sweats or weight changes.  Cardiovascular: negative for chest pain, dyspnea on exertion, edema, orthopnea, palpitations, paroxysmal nocturnal dyspnea or shortness of breath Dermatological: negative for rash Respiratory: negative for cough or wheezing Urologic: negative for hematuria Abdominal: negative for nausea, vomiting, diarrhea, bright red blood per rectum, melena, or hematemesis Neurologic: negative for visual changes, syncope, or dizziness All other systems reviewed and are otherwise negative except as noted above.    Blood pressure  100/66, pulse 65, height 5\' 9"  (1.753 m), weight 245 lb (111.1 kg).  General appearance: alert and no distress Neck: no adenopathy, no carotid bruit, no JVD, supple, symmetrical, trachea midline and thyroid not enlarged, symmetric, no tenderness/mass/nodules Lungs: clear to auscultation bilaterally Heart: regular rate and rhythm, S1, S2 normal, no murmur, click, rub or gallop Extremities: extremities normal, atraumatic, no cyanosis or edema Pulses: 2+ and symmetric Skin: Skin color, texture, turgor normal. No rashes or lesions Neurologic: Alert and oriented X 3, normal strength and tone. Normal symmetric reflexes. Normal coordination and gait  EKG sinus rhythm at 65 without ST or T-wave changes. I personally reviewed this EKG.  ASSESSMENT AND PLAN:   CAD hx of 2 stents to RCA in 2003,  80-90% stenosis of LAD/CFX June 2014- medical Rx History of CAD status post RCA stenting by myself in 2003 with residual LAD disease. I catheterized him in 2014 revealing an 80-90% focal distal LAD stenosis and a small vessel, nondominant circumflex with a 90% mid AV groove stenosis which was a small vessel as well and a moderate to large marginal branch with a 60% proximal segmental calcified stenosis. The RCA was widely patent. EF was normal. He denies chest pain or shortness of breath.  Hyperlipidemia History of hyperlipidemia on statin therapy  HTN (hypertension) History of essential hypertension blood pressure measured 100/66. He is on losartan, hydrochlorothiazide and metoprolol as well as amlodipine. Continue current meds at current dosing.  Carotid artery disease History of carotid artery disease status post uncomplicated elective left carotid endarterectomy performed by Dr. [name] at my request after angiography revealed a 95% proximal left ICA stenosis on 06/02/13. We followed this noninvasively on an annual basis. His last Doppler performed 04/08/17 revealed widely patent carotid  arteries.  Claudication in peripheral vascular disease- Rt leg History of peripheral arterial disease status post diamondback orbital rotational atherectomy, PTA and stenting of his right external iliac artery by myself 10/20/13. He had excellent angiographic result. His most recent Dopplers performed 04/08/17 revealed a right ABI of 0.85 and a left appointment 96 with moderate disease in his right iliac. He does complain of back and hip pain with ambulation and has been told that he has spinal stenosis and needs decompressive laminectomy. I'm going to repeat his arterial Dopplers to ensure that this is not vascular  in nature and if unchanged clear him at low risk for his upcoming orthopedic procedure.      [name] MD FACP,FACC,FAHA, [facility] 01/15/2018 9:12 AM

## 2018-01-15 NOTE — Assessment & Plan Note (Signed)
History of essential hypertension blood pressure measured 100/66. He is on losartan, hydrochlorothiazide and metoprolol as well as amlodipine. Continue current meds at current dosing.

## 2018-01-21 ENCOUNTER — Ambulatory Visit (HOSPITAL_BASED_OUTPATIENT_CLINIC_OR_DEPARTMENT_OTHER)
Admission: RE | Admit: 2018-01-21 | Discharge: 2018-01-21 | Disposition: A | Discharge status: Home / Self Care | Payer: Medicare Other | Source: Ambulatory Visit | Attending: Cardiovascular Disease | Admitting: Cardiovascular Disease

## 2018-01-21 ENCOUNTER — Ambulatory Visit (HOSPITAL_COMMUNITY)
Admission: RE | Admit: 2018-01-21 | Discharge: 2018-01-21 | Disposition: A | Discharge status: Home / Self Care | Payer: Medicare Other | Source: Ambulatory Visit | Attending: Cardiovascular Disease | Admitting: Cardiovascular Disease

## 2018-01-21 DIAGNOSIS — I700 Atherosclerosis of aorta: Secondary | ICD-10-CM | POA: Insufficient documentation

## 2018-01-21 DIAGNOSIS — I739 Peripheral vascular disease, unspecified: Secondary | ICD-10-CM

## 2018-01-21 DIAGNOSIS — I2510 Atherosclerotic heart disease of native coronary artery without angina pectoris: Secondary | ICD-10-CM | POA: Insufficient documentation

## 2018-01-21 DIAGNOSIS — E1151 Type 2 diabetes mellitus with diabetic peripheral angiopathy without gangrene: Secondary | ICD-10-CM | POA: Diagnosis not present

## 2018-01-21 DIAGNOSIS — Z87891 Personal history of nicotine dependence: Secondary | ICD-10-CM | POA: Diagnosis not present

## 2018-01-21 DIAGNOSIS — I6522 Occlusion and stenosis of left carotid artery: Secondary | ICD-10-CM | POA: Insufficient documentation

## 2018-01-21 DIAGNOSIS — E669 Obesity, unspecified: Secondary | ICD-10-CM | POA: Diagnosis not present

## 2018-01-21 DIAGNOSIS — I70208 Unspecified atherosclerosis of native arteries of extremities, other extremity: Secondary | ICD-10-CM | POA: Insufficient documentation

## 2018-01-21 DIAGNOSIS — E785 Hyperlipidemia, unspecified: Secondary | ICD-10-CM | POA: Insufficient documentation

## 2018-01-21 DIAGNOSIS — I10 Essential (primary) hypertension: Secondary | ICD-10-CM | POA: Insufficient documentation

## 2018-01-25 ENCOUNTER — Telehealth: Payer: Self-pay | Admitting: Cardiovascular Disease

## 2018-01-25 NOTE — Telephone Encounter (Signed)
New Message      [facility] Pre-operative Risk Assessment    Request for surgical clearance:  1. What type of surgery is being performed? Myelogram  2. When is this surgery scheduled? TBD   3. What type of clearance is required (medical clearance vs. Pharmacy clearance to hold med vs. Both)? Pharmacy  4. Are there any medications that need to be held prior to surgery and how long? Plavix  5. Practice name and name of physician performing surgery? [facility]  6. What is your office phone number [phone]    7.   What is your office fax number [phone]   8.   Anesthesia type (None, local, MAC, general) ? local   Albert Powell 01/25/2018, 9:50 AM  _________________________________________________________________   (provider comments below)

## 2018-01-26 ENCOUNTER — Other Ambulatory Visit: Payer: Self-pay | Admitting: *Deleted

## 2018-01-26 DIAGNOSIS — I708 Atherosclerosis of other arteries: Principal | ICD-10-CM

## 2018-01-26 DIAGNOSIS — I700 Atherosclerosis of aorta: Secondary | ICD-10-CM

## 2018-01-26 DIAGNOSIS — I739 Peripheral vascular disease, unspecified: Secondary | ICD-10-CM

## 2018-01-26 NOTE — Telephone Encounter (Signed)
   Primary Cardiologist:Jonathan Allyson Sabal, MD  Chart reviewed as part of pre-operative protocol coverage.   77 y.o. male with a hx of CAD s/p PCI to the RCA in 2003 with known residual disease in the LAD and LCx treated medically, HTN, HL, carotid artery dz s/p L CEA, PAD s/p R EIA stenting in the past.  Last seen by Dr. Allyson Sabal 01/15/18.    I will route to Dr. Allyson Sabal to confirm that it is ok to hold Plavix for upcoming myelogram.  Tereso Newcomer, PA-C  01/26/2018, 4:36 PM

## 2018-01-27 NOTE — Telephone Encounter (Signed)
   Primary Cardiologist: Nanetta Batty, MD  Chart reviewed by Dr Allyson Sabal as part of pre-operative protocol coverage. Given past medical history and time since last visit, based on ACC/AHA guidelines, Albert Powell would be at acceptable risk for the planned procedure without further cardiovascular testing.   Dr Allyson Sabal has indicated the Plavix can be held 5 -7 days pre op for myelogram, resume as soon as safe post op.   I will route this recommendation to the requesting party via Epic fax function and remove from pre-op pool.  Please call with questions.  Corine Shelter, PA-C 01/27/2018, 1:38 PM

## 2018-01-27 NOTE — Telephone Encounter (Signed)
I am okay with him holding his Plavix for a myelogram.

## 2018-01-29 DIAGNOSIS — I2510 Atherosclerotic heart disease of native coronary artery without angina pectoris: Secondary | ICD-10-CM | POA: Diagnosis not present

## 2018-01-29 DIAGNOSIS — E1121 Type 2 diabetes mellitus with diabetic nephropathy: Secondary | ICD-10-CM | POA: Diagnosis not present

## 2018-01-29 DIAGNOSIS — Z7984 Long term (current) use of oral hypoglycemic drugs: Secondary | ICD-10-CM | POA: Diagnosis not present

## 2018-01-29 DIAGNOSIS — F325 Major depressive disorder, single episode, in full remission: Secondary | ICD-10-CM | POA: Diagnosis not present

## 2018-01-29 DIAGNOSIS — J441 Chronic obstructive pulmonary disease with (acute) exacerbation: Secondary | ICD-10-CM | POA: Diagnosis not present

## 2018-01-29 DIAGNOSIS — I10 Essential (primary) hypertension: Secondary | ICD-10-CM | POA: Diagnosis not present

## 2018-01-29 DIAGNOSIS — E785 Hyperlipidemia, unspecified: Secondary | ICD-10-CM | POA: Diagnosis not present

## 2018-02-04 ENCOUNTER — Ambulatory Visit
Admission: RE | Admit: 2018-02-04 | Discharge: 2018-02-04 | Disposition: A | Discharge status: Home / Self Care | Payer: Medicare Other | Source: Ambulatory Visit | Attending: Orthopedic Surgery | Admitting: Orthopedic Surgery

## 2018-02-04 DIAGNOSIS — M5416 Radiculopathy, lumbar region: Secondary | ICD-10-CM

## 2018-02-04 DIAGNOSIS — M5126 Other intervertebral disc displacement, lumbar region: Secondary | ICD-10-CM | POA: Diagnosis not present

## 2018-02-04 MED ORDER — IOPAMIDOL (ISOVUE-M 200) INJECTION 41%
15.0000 mL | Freq: Once | INTRAMUSCULAR | Status: AC
  Administered 2018-02-04: 15 mL via INTRATHECAL

## 2018-02-04 MED ORDER — DIAZEPAM 5 MG PO TABS
5.0000 mg | ORAL_TABLET | Freq: Once | ORAL | Status: AC
  Administered 2018-02-04: 5 mg via ORAL

## 2018-02-04 NOTE — Discharge Instructions (Signed)

## 2018-02-15 DIAGNOSIS — M5136 Other intervertebral disc degeneration, lumbar region: Secondary | ICD-10-CM | POA: Diagnosis not present

## 2018-02-15 DIAGNOSIS — M545 Low back pain: Secondary | ICD-10-CM | POA: Diagnosis not present

## 2018-02-19 ENCOUNTER — Telehealth: Payer: Self-pay | Admitting: Cardiovascular Disease

## 2018-02-19 NOTE — Telephone Encounter (Signed)
   [facility] Pre-operative Risk Assessment    Request for surgical clearance:  1. What type of surgery is being performed? Lumbar ESI (injection)   2. When is this surgery scheduled? 03/04/18   3. What type of clearance is required (medical clearance vs. Pharmacy clearance to hold med vs. Both)? Pharmacy   4. Are there any medications that need to be held prior to surgery and how long? plavix - 5 days prior   5. Practice name and name of physician performing surgery? Dr. Nelva Bush with [facility]   6. What is your office phone number [phone] (ext: [phone] for Manuela Schwartz)    7.   What is your office fax number [phone]  8.   Anesthesia type (None, local, MAC, general) ? None    Sheral Apley M 02/19/2018, 3:33 PM  _________________________________________________________________   (provider comments below)

## 2018-02-22 NOTE — Telephone Encounter (Signed)
Pt was just seen by Dr. Allyson Sabal 01/2018 and cleared go hold Plavix for 5 days (see previous preop clearance 01/27/18).  Plavix can be held for 5 days prior to lumbar injection.   I will fax clearance to Dr. Ethelene Hal and will remove from preop pool.

## 2018-02-26 DIAGNOSIS — I2510 Atherosclerotic heart disease of native coronary artery without angina pectoris: Secondary | ICD-10-CM | POA: Diagnosis not present

## 2018-02-26 DIAGNOSIS — J449 Chronic obstructive pulmonary disease, unspecified: Secondary | ICD-10-CM | POA: Diagnosis not present

## 2018-02-26 DIAGNOSIS — F325 Major depressive disorder, single episode, in full remission: Secondary | ICD-10-CM | POA: Diagnosis not present

## 2018-02-26 DIAGNOSIS — Z7984 Long term (current) use of oral hypoglycemic drugs: Secondary | ICD-10-CM | POA: Diagnosis not present

## 2018-02-26 DIAGNOSIS — J441 Chronic obstructive pulmonary disease with (acute) exacerbation: Secondary | ICD-10-CM | POA: Diagnosis not present

## 2018-02-26 DIAGNOSIS — E1121 Type 2 diabetes mellitus with diabetic nephropathy: Secondary | ICD-10-CM | POA: Diagnosis not present

## 2018-02-26 DIAGNOSIS — I10 Essential (primary) hypertension: Secondary | ICD-10-CM | POA: Diagnosis not present

## 2018-02-26 DIAGNOSIS — E785 Hyperlipidemia, unspecified: Secondary | ICD-10-CM | POA: Diagnosis not present

## 2018-03-09 DIAGNOSIS — M5136 Other intervertebral disc degeneration, lumbar region: Secondary | ICD-10-CM | POA: Diagnosis not present

## 2018-03-09 DIAGNOSIS — M5416 Radiculopathy, lumbar region: Secondary | ICD-10-CM | POA: Diagnosis not present

## 2018-03-22 ENCOUNTER — Other Ambulatory Visit: Payer: Self-pay

## 2018-03-22 ENCOUNTER — Encounter: Payer: Self-pay | Admitting: Surgery

## 2018-03-22 ENCOUNTER — Encounter

## 2018-03-22 ENCOUNTER — Ambulatory Visit (INDEPENDENT_AMBULATORY_CARE_PROVIDER_SITE_OTHER): Payer: Medicare Other | Admitting: Surgery

## 2018-03-22 VITALS — BP 119/73 | HR 86 | Resp 20 | Ht 69.0 in | Wt 240.8 lb

## 2018-03-22 DIAGNOSIS — I6523 Occlusion and stenosis of bilateral carotid arteries: Secondary | ICD-10-CM | POA: Diagnosis not present

## 2018-03-22 DIAGNOSIS — I739 Peripheral vascular disease, unspecified: Secondary | ICD-10-CM

## 2018-03-22 NOTE — Progress Notes (Signed)
 [facility] [place]  Patient name: Albert Powell MRN: [id number] DOB: 06/21/1941 Sex: male   REASON FOR VISIT:    Follow up  HISOTRY OF PRESENT ILLNESS:    Albert Powell is a 77 y.o. male who I performed left carotid endarterectomy on 06/02/2013 for asymptomatic stenosis.  Intraoperative findings included a 95% stenosis.  The patient has a history of lower extremity vascular disease.  On 10/20/2013 he underwent atherectomy and stenting of his right external iliac artery by Dr. Barry.  The patient is continuing to have pain in his buttocks thigh and calf with walking approximately 50 feet.  He does not get pain but rather numbness as if his legs are going to give out.  He can rest for 3 or 4 minutes and the symptoms go away and he can go another 50 feet before they recur.  The patient has spinal stenosis and is being evaluated by Dr. Brooks for intervention.  His preprocedure imaging showed heavily calcified vasculature.  He was referred for further evaluation   PAST MEDICAL HISTORY:   Past Medical History:  Diagnosis Date  . Bradycardia, sinus 02/18/2013  . CAD (coronary artery disease)    stent to RCA 2003 also had 40% lesion at that time; NUCLEAR STRESS TEST, 01/16/2010 - normal  now with cath 80-90% stenosis in LAD, will try medical therapy if no improvement PCI  . Carotid artery disease ([name])   . Cataract   . Claudication ([name])    LEA DUPLEX, 07/07/2008 - Normal  . COPD (chronic obstructive pulmonary disease) ([name])   . DDD (degenerative disc disease) 2008  . Diabetes mellitus   . GERD (gastroesophageal reflux disease)   . H/O hiatal hernia   . History of colonoscopy 2004   finding of tics and AMV only no polpys  . Hyperlipidemia 02/05/2013  . Hypertension   . Onychomycosis   . Rosacea   . Tobacco abuse 02/05/2013     FAMILY HISTORY:   Family History  Problem Relation Age of Onset  . Heart disease Father     heart attack at 59  . Heart attack Father   . Hyperlipidemia Father   . Colonic polyp Mother        that bled out  . COPD Mother   . Diabetes Mother   . Heart disease Sister   . Colonic polyp Sister   . Stroke Maternal Grandfather   . Heart attack Paternal Grandfather   . Other Neg Hx        hypogonadism    SOCIAL HISTORY:   Social History   Tobacco Use  . Smoking status: Former Smoker    Packs/day: 0.75    Years: 57.00    Pack years: 42.75    Types: Cigarettes    Last attempt to quit: 04/06/2013    Years since quitting: 4.9  . Smokeless tobacco: Former User    Quit date: 04/14/2013  . Tobacco comment: pt states that he is using the vapor cigs  Substance Use Topics  . Alcohol use: Yes    Alcohol/week: 0.6 oz    Types: 1 Glasses of wine per week     ALLERGIES:   Allergies  Allergen Reactions  . Fentanyl Other (See Comments)    Behavioral changes  . Gabapentin     ankle swells  . Lisinopril     unknown  . Lyrica [Pregabalin]     unknown  . Metformin Diarrhea       CURRENT MEDICATIONS:   Current Outpatient Medications  Medication Sig Dispense Refill  . amLODipine (NORVASC) 10 MG tablet Take 10 mg by mouth every evening.     . atorvastatin (LIPITOR) 40 MG tablet Take 40 mg by mouth daily.    . clopidogrel (PLAVIX) 75 MG tablet TAKE 1 TABLET (75 MG TOTAL) BY MOUTH DAILY. 90 tablet 2  . Fluticasone-Salmeterol (ADVAIR) 100-50 MCG/DOSE AEPB Inhale 1 puff into the lungs 2 (two) times daily as needed (for shortness of breath).     . glipiZIDE (GLUCOTROL) 5 MG tablet Take 1 tablet by mouth daily.    . HYDROcodone-acetaminophen (NORCO) 7.5-325 MG per tablet as needed.   0  . isosorbide mononitrate (IMDUR) 60 MG 24 hr tablet Take 1 tablet (60 mg total) by mouth daily.    . losartan-hydrochlorothiazide (HYZAAR) 100-25 MG per tablet Take 1 tablet by mouth every evening.    . metoprolol succinate (TOPROL-XL) 25 MG 24 hr tablet TAKE 1/2 TABLET BY MOUTH DAILY 15 tablet 6    . nitroGLYCERIN (NITROSTAT) 0.4 MG SL tablet Place 0.4 mg under the tongue every 5 (five) minutes as needed for chest pain.    . pantoprazole (PROTONIX) 40 MG tablet Take 40 mg by mouth daily.     Current Facility-Administered Medications  Medication Dose Route Frequency Provider Last Rate Last Dose  . morphine injection 10 mg  10 mg Intravenous Q1H PRN Berry, Jonathan J, MD      . promethazine (PHENERGAN) injection 25 mg  25 mg Intravenous Q6H PRN Berry, Jonathan J, MD        REVIEW OF SYSTEMS:   [X] denotes positive finding, [ ] denotes negative finding Cardiac  Comments:  Chest pain or chest pressure:    Shortness of breath upon exertion:    Short of breath when lying flat:    Irregular heart rhythm:        Vascular    Pain in calf, thigh, or hip brought on by ambulation: x   Pain in feet at night that wakes you up from your sleep:     Blood clot in your veins:    Leg swelling:         Pulmonary    Oxygen at home:    Productive cough:     Wheezing:         Neurologic    Sudden weakness in arms or legs:  x   Sudden numbness in arms or legs:     Sudden onset of difficulty speaking or slurred speech:    Temporary loss of vision in one eye:     Problems with dizziness:         Gastrointestinal    Blood in stool:     Vomited blood:         Genitourinary    Burning when urinating:     Blood in urine:        Psychiatric    Major depression:         Hematologic    Bleeding problems:    Problems with blood clotting too easily:        Skin    Rashes or ulcers:        Constitutional    Fever or chills:      PHYSICAL EXAM:   Vitals:   03/22/18 1619 03/22/18 1622  BP: 107/68 119/73  Pulse: 86   Resp: 20   SpO2: 96%   Weight: 240 lb 12.8 oz (109.2 kg)     Height: 5' 9" (1.753 m)     GENERAL: The patient is a well-nourished male, in no acute distress. The vital signs are documented above. CARDIAC: There is a regular rate and rhythm.  VASCULAR: difficult to  palpate femoral pulses PULMONARY: Non-labored respirations  MUSCULOSKELETAL: There are no major deformities or cyanosis. NEUROLOGIC: No focal weakness or paresthesias are detected. SKIN: There are no ulcers or rashes noted. PSYCHIATRIC: The patient has a normal affect.  STUDIES:   I have reviewed his vascular studies from heart care.  His carotid endarterectomy site is widely patent.  Right ABI= 0.83 Left ABI= 0.83  Greater than 50% bilateral common iliac artery stenosis.  Greater than 50% bilateral external iliac artery stenosis  MEDICAL ISSUES:   Carotid: The patient's carotid endarterectomy site is widely patent.  He will continue to have surveillance follow-up with Dr. Berry  Hip and leg pain: The patient has a spinal stenosis but his symptoms can also be attributed to aortoiliac insufficiency.  He did have a ultrasound performed at heart care which showed aortoiliac occlusive disease.  Before committing him to laminectomy, I think that he would benefit from angiography to see if his blood flow is the cause of his symptoms.  Dr. Berry has been his interventionalists in the past.  I offered the patient referral back to Dr. Berry for the procedure, however he wanted me to do the procedure.  This will be scheduled for Tuesday, July 16.  I will plan on right femoral access, potentially bilateral access.    Wells Brabham, MD [facility] [place] Tel [phone] Pager [phone] 

## 2018-03-22 NOTE — H&P (View-Only) (Signed)
[facility]in Specialist of   Patient name: Albert Powell MRN: [id number] DOB: 08/05/1941 Sex: male   REASON FOR VISIT:    Follow up  HISOTRY OF PRESENT ILLNESS:    Albert Powell is a 77 y.o. male who I performed left carotid endarterectomy on 06/02/2013 for asymptomatic stenosis.  Intraoperative findings included a 95% stenosis.  The patient has a history of lower extremity vascular disease.  On 10/20/2013 he underwent atherectomy and stenting of his right external iliac artery by Dr. Gery Pray.  The patient is continuing to have pain in his buttocks thigh and calf with walking approximately 50 feet.  He does not get pain but rather numbness as if his legs are going to give out.  He can rest for 3 or 4 minutes and the symptoms go away and he can go another 50 feet before they recur.  The patient has spinal stenosis and is being evaluated by Dr. Shon Baton for intervention.  His preprocedure imaging showed heavily calcified vasculature.  He was referred for further evaluation   PAST MEDICAL HISTORY:   Past Medical History:  Diagnosis Date  . Bradycardia, sinus 02/18/2013  . CAD (coronary artery disease)    stent to RCA 2003 also had 40% lesion at that time; NUCLEAR STRESS TEST, 01/16/2010 - normal  now with cath 80-90% stenosis in LAD, will try medical therapy if no improvement PCI  . Carotid artery disease ([name])   . Cataract   . Claudication ([name])    LEA DUPLEX, 07/07/2008 - Normal  . COPD (chronic obstructive pulmonary disease) ([name])   . DDD (degenerative disc disease) 2008  . Diabetes mellitus   . GERD (gastroesophageal reflux disease)   . H/O hiatal hernia   . History of colonoscopy 2004   finding of tics and AMV only no polpys  . Hyperlipidemia 02/05/2013  . Hypertension   . Onychomycosis   . Rosacea   . Tobacco abuse 02/05/2013     FAMILY HISTORY:   Family History  Problem Relation Age of Onset  . Heart disease Father     heart attack at 36  . Heart attack Father   . Hyperlipidemia Father   . Colonic polyp Mother        that bled out  . COPD Mother   . Diabetes Mother   . Heart disease Sister   . Colonic polyp Sister   . Stroke Maternal Grandfather   . Heart attack Paternal Grandfather   . Other Neg Hx        hypogonadism    SOCIAL HISTORY:   Social History   Tobacco Use  . Smoking status: Former Smoker    Packs/day: 0.75    Years: 57.00    Pack years: 42.75    Types: Cigarettes    Last attempt to quit: 04/06/2013    Years since quitting: 4.9  . Smokeless tobacco: Former Neurosurgeon    Quit date: 04/14/2013  . Tobacco comment: pt states that he is using the vapor cigs  Substance Use Topics  . Alcohol use: Yes    Alcohol/week: 0.6 oz    Types: 1 Glasses of wine per week     ALLERGIES:   Allergies  Allergen Reactions  . Fentanyl Other (See Comments)    Behavioral changes  . Gabapentin     ankle swells  . Lisinopril     unknown  . Lyrica [Pregabalin]     unknown  . Metformin Diarrhea  CURRENT MEDICATIONS:   Current Outpatient Medications  Medication Sig Dispense Refill  . amLODipine (NORVASC) 10 MG tablet Take 10 mg by mouth every evening.     [name] atorvastatin (LIPITOR) 40 MG tablet Take 40 mg by mouth daily.    . clopidogrel (PLAVIX) 75 MG tablet TAKE 1 TABLET (75 MG TOTAL) BY MOUTH DAILY. 90 tablet 2  . Fluticasone-Salmeterol (ADVAIR) 100-50 MCG/DOSE AEPB Inhale 1 puff into the lungs 2 (two) times daily as needed (for shortness of breath).     [name] glipiZIDE (GLUCOTROL) 5 MG tablet Take 1 tablet by mouth daily.    [name] HYDROcodone-acetaminophen (NORCO) 7.5-325 MG per tablet as needed.   0  . isosorbide mononitrate (IMDUR) 60 MG 24 hr tablet Take 1 tablet (60 mg total) by mouth daily.    [name] losartan-hydrochlorothiazide (HYZAAR) 100-25 MG per tablet Take 1 tablet by mouth every evening.    . metoprolol succinate (TOPROL-XL) 25 MG 24 hr tablet TAKE 1/2 TABLET BY MOUTH DAILY 15 tablet 6    . nitroGLYCERIN (NITROSTAT) 0.4 MG SL tablet Place 0.4 mg under the tongue every 5 (five) minutes as needed for chest pain.    . pantoprazole (PROTONIX) 40 MG tablet Take 40 mg by mouth daily.     Current Facility-Administered Medications  Medication Dose Route Frequency Provider Last Rate Last Dose  . morphine injection 10 mg  10 mg Intravenous Q1H PRN [name], MD      . promethazine (PHENERGAN) injection 25 mg  25 mg Intravenous Q6H PRN [name], MD        REVIEW OF SYSTEMS:   [X]  denotes positive finding, [ ]  denotes negative finding Cardiac  Comments:  Chest pain or chest pressure:    Shortness of breath upon exertion:    Short of breath when lying flat:    Irregular heart rhythm:        Vascular    Pain in calf, thigh, or hip brought on by ambulation: x   Pain in feet at night that wakes you up from your sleep:     Blood clot in your veins:    Leg swelling:         Pulmonary    Oxygen at home:    Productive cough:     Wheezing:         Neurologic    Sudden weakness in arms or legs:  x   Sudden numbness in arms or legs:     Sudden onset of difficulty speaking or slurred speech:    Temporary loss of vision in one eye:     Problems with dizziness:         Gastrointestinal    Blood in stool:     Vomited blood:         Genitourinary    Burning when urinating:     Blood in urine:        Psychiatric    Major depression:         Hematologic    Bleeding problems:    Problems with blood clotting too easily:        Skin    Rashes or ulcers:        Constitutional    Fever or chills:      PHYSICAL EXAM:   Vitals:   03/22/18 1619 03/22/18 1622  BP: 107/68 119/73  Pulse: 86   Resp: 20   SpO2: 96%   Weight: 240 lb 12.8 oz (109.2 kg)  Height: 5\' 9"  (1.753 m)     GENERAL: The patient is a well-nourished male, in no acute distress. The vital signs are documented above. CARDIAC: There is a regular rate and rhythm.  VASCULAR: difficult to  palpate femoral pulses PULMONARY: Non-labored respirations  MUSCULOSKELETAL: There are no major deformities or cyanosis. NEUROLOGIC: No focal weakness or paresthesias are detected. SKIN: There are no ulcers or rashes noted. PSYCHIATRIC: The patient has a normal affect.  STUDIES:   I have reviewed his vascular studies from heart care.  His carotid endarterectomy site is widely patent.  Right ABI= 0.83 Left ABI= 0.83  Greater than 50% bilateral common iliac artery stenosis.  Greater than 50% bilateral external iliac artery stenosis  MEDICAL ISSUES:   Carotid: The patient's carotid endarterectomy site is widely patent.  He will continue to have surveillance follow-up with Dr. Allyson Sabal  Hip and leg pain: The patient has a spinal stenosis but his symptoms can also be attributed to aortoiliac insufficiency.  He did have a ultrasound performed at heart care which showed aortoiliac occlusive disease.  Before committing him to laminectomy, I think that he would benefit from angiography to see if his blood flow is the cause of his symptoms.  Dr. Allyson Sabal has been his interventionalists in the past.  I offered the patient referral back to Dr. Allyson Sabal for the procedure, however he wanted me to do the procedure.  This will be scheduled for Tuesday, July 16.  I will plan on right femoral access, potentially bilateral access.    Durene Cal, MD [facility] Specialists of [facility] [phone] Pager [phone]

## 2018-03-23 ENCOUNTER — Other Ambulatory Visit: Payer: Self-pay | Admitting: *Deleted

## 2018-03-23 NOTE — Progress Notes (Signed)
Patient instructed to be at [facility] admitting department at 8 am on 03/30/18 for procedure. NPO past MN night prior except take the following medications with sips of water am of procedure: metoprolol, Hyzaar, isosorbide, amlodipine, inhaler. Hold all others and must have a driver for home. Verbalized understanding.

## 2018-03-30 ENCOUNTER — Telehealth: Payer: Self-pay | Admitting: Surgery

## 2018-03-30 ENCOUNTER — Encounter (HOSPITAL_COMMUNITY)
Admission: RE | Disposition: A | Discharge status: Home / Self Care | Payer: Self-pay | Source: Ambulatory Visit | Attending: Vascular Surgery

## 2018-03-30 ENCOUNTER — Observation Stay (HOSPITAL_COMMUNITY)
Admission: RE | Admit: 2018-03-30 | Discharge: 2018-03-31 | Disposition: A | Discharge status: Home / Self Care | Payer: Medicare Other | Source: Ambulatory Visit | Attending: Vascular Surgery | Admitting: Vascular Surgery

## 2018-03-30 DIAGNOSIS — Z885 Allergy status to narcotic agent status: Secondary | ICD-10-CM | POA: Insufficient documentation

## 2018-03-30 DIAGNOSIS — I10 Essential (primary) hypertension: Secondary | ICD-10-CM | POA: Diagnosis not present

## 2018-03-30 DIAGNOSIS — Z888 Allergy status to other drugs, medicaments and biological substances status: Secondary | ICD-10-CM | POA: Insufficient documentation

## 2018-03-30 DIAGNOSIS — L719 Rosacea, unspecified: Secondary | ICD-10-CM | POA: Diagnosis not present

## 2018-03-30 DIAGNOSIS — Z833 Family history of diabetes mellitus: Secondary | ICD-10-CM | POA: Diagnosis not present

## 2018-03-30 DIAGNOSIS — J449 Chronic obstructive pulmonary disease, unspecified: Secondary | ICD-10-CM | POA: Insufficient documentation

## 2018-03-30 DIAGNOSIS — Z7901 Long term (current) use of anticoagulants: Secondary | ICD-10-CM | POA: Diagnosis not present

## 2018-03-30 DIAGNOSIS — I739 Peripheral vascular disease, unspecified: Secondary | ICD-10-CM

## 2018-03-30 DIAGNOSIS — Z87891 Personal history of nicotine dependence: Secondary | ICD-10-CM | POA: Insufficient documentation

## 2018-03-30 DIAGNOSIS — I6529 Occlusion and stenosis of unspecified carotid artery: Secondary | ICD-10-CM | POA: Diagnosis not present

## 2018-03-30 DIAGNOSIS — Z823 Family history of stroke: Secondary | ICD-10-CM | POA: Diagnosis not present

## 2018-03-30 DIAGNOSIS — Z955 Presence of coronary angioplasty implant and graft: Secondary | ICD-10-CM | POA: Insufficient documentation

## 2018-03-30 DIAGNOSIS — Z7984 Long term (current) use of oral hypoglycemic drugs: Secondary | ICD-10-CM | POA: Diagnosis not present

## 2018-03-30 DIAGNOSIS — Z79899 Other long term (current) drug therapy: Secondary | ICD-10-CM | POA: Insufficient documentation

## 2018-03-30 DIAGNOSIS — H269 Unspecified cataract: Secondary | ICD-10-CM | POA: Insufficient documentation

## 2018-03-30 DIAGNOSIS — Z8249 Family history of ischemic heart disease and other diseases of the circulatory system: Secondary | ICD-10-CM | POA: Diagnosis not present

## 2018-03-30 DIAGNOSIS — T148XXA Other injury of unspecified body region, initial encounter: Secondary | ICD-10-CM | POA: Diagnosis present

## 2018-03-30 DIAGNOSIS — E1151 Type 2 diabetes mellitus with diabetic peripheral angiopathy without gangrene: Secondary | ICD-10-CM | POA: Diagnosis not present

## 2018-03-30 DIAGNOSIS — I701 Atherosclerosis of renal artery: Secondary | ICD-10-CM | POA: Insufficient documentation

## 2018-03-30 DIAGNOSIS — K219 Gastro-esophageal reflux disease without esophagitis: Secondary | ICD-10-CM | POA: Insufficient documentation

## 2018-03-30 DIAGNOSIS — E785 Hyperlipidemia, unspecified: Secondary | ICD-10-CM | POA: Insufficient documentation

## 2018-03-30 DIAGNOSIS — I70213 Atherosclerosis of native arteries of extremities with intermittent claudication, bilateral legs: Secondary | ICD-10-CM | POA: Diagnosis not present

## 2018-03-30 HISTORY — PX: ABDOMINAL AORTOGRAM W/LOWER EXTREMITY: CATH118223

## 2018-03-30 HISTORY — PX: PERIPHERAL VASCULAR INTERVENTION: CATH118257

## 2018-03-30 LAB — POCT ACTIVATED CLOTTING TIME
ACTIVATED CLOTTING TIME: 202 s
Activated Clotting Time: 224 seconds

## 2018-03-30 LAB — POCT I-STAT, CHEM 8
BUN: 17 mg/dL (ref 8–23)
CALCIUM ION: 1.15 mmol/L (ref 1.15–1.40)
CREATININE: 1.5 mg/dL — AB (ref 0.61–1.24)
Chloride: 96 mmol/L — ABNORMAL LOW (ref 98–111)
Glucose, Bld: 143 mg/dL — ABNORMAL HIGH (ref 70–99)
HEMATOCRIT: 37 % — AB (ref 39.0–52.0)
HEMOGLOBIN: 12.6 g/dL — AB (ref 13.0–17.0)
Potassium: 3.3 mmol/L — ABNORMAL LOW (ref 3.5–5.1)
SODIUM: 139 mmol/L (ref 135–145)
TCO2: 28 mmol/L (ref 22–32)

## 2018-03-30 LAB — GLUCOSE, CAPILLARY
GLUCOSE-CAPILLARY: 126 mg/dL — AB (ref 70–99)
Glucose-Capillary: 115 mg/dL — ABNORMAL HIGH (ref 70–99)
Glucose-Capillary: 188 mg/dL — ABNORMAL HIGH (ref 70–99)
Glucose-Capillary: 193 mg/dL — ABNORMAL HIGH (ref 70–99)

## 2018-03-30 SURGERY — ABDOMINAL AORTOGRAM W/LOWER EXTREMITY
Anesthesia: LOCAL

## 2018-03-30 MED ORDER — LIDOCAINE HCL (PF) 1 % IJ SOLN
INTRAMUSCULAR | Status: DC | PRN
  Administered 2018-03-30 (×2): 12 mL via INTRADERMAL

## 2018-03-30 MED ORDER — MORPHINE SULFATE (PF) 2 MG/ML IV SOLN
INTRAVENOUS | Status: DC | PRN
  Administered 2018-03-30: 2 mg via INTRAVENOUS

## 2018-03-30 MED ORDER — ONDANSETRON HCL 4 MG/2ML IJ SOLN: 4.0000 mg | Freq: Four times a day (QID) | INTRAMUSCULAR | Status: DC | PRN

## 2018-03-30 MED ORDER — SODIUM CHLORIDE 0.9 % IV SOLN
INTRAVENOUS | Status: DC
  Administered 2018-03-30: 09:00:00 via INTRAVENOUS

## 2018-03-30 MED ORDER — HEPARIN (PORCINE) IN NACL 2000-0.9 UNIT/L-% IV SOLN
INTRAVENOUS | Status: DC | PRN
  Administered 2018-03-30: 500 mL

## 2018-03-30 MED ORDER — SODIUM CHLORIDE 0.9% FLUSH: 3.0000 mL | INTRAVENOUS | Status: DC | PRN

## 2018-03-30 MED ORDER — HEPARIN (PORCINE) IN NACL 1000-0.9 UT/500ML-% IV SOLN
INTRAVENOUS | Status: AC
  Filled 2018-03-30: qty 1000

## 2018-03-30 MED ORDER — IODIXANOL 320 MG/ML IV SOLN
INTRAVENOUS | Status: DC | PRN
  Administered 2018-03-30: 120 mL via INTRA_ARTERIAL

## 2018-03-30 MED ORDER — MORPHINE SULFATE (PF) 2 MG/ML IV SOLN
2.0000 mg | INTRAVENOUS | Status: DC | PRN
  Administered 2018-03-30: 2 mg via INTRAVENOUS
  Filled 2018-03-30: qty 1

## 2018-03-30 MED ORDER — SODIUM CHLORIDE 0.9 % WEIGHT BASED INFUSION: 1.0000 mL/kg/h | INTRAVENOUS | Status: AC

## 2018-03-30 MED ORDER — OXYCODONE HCL 5 MG PO TABS: 5.0000 mg | ORAL_TABLET | ORAL | Status: DC | PRN

## 2018-03-30 MED ORDER — MORPHINE SULFATE (PF) 10 MG/ML IV SOLN: 2.0000 mg | INTRAVENOUS | Status: DC | PRN

## 2018-03-30 MED ORDER — MORPHINE SULFATE (PF) 10 MG/ML IV SOLN
INTRAVENOUS | Status: AC
  Filled 2018-03-30: qty 1

## 2018-03-30 MED ORDER — HEPARIN (PORCINE) IN NACL 1000-0.9 UT/500ML-% IV SOLN
INTRAVENOUS | Status: DC | PRN
  Administered 2018-03-30: 500 mL

## 2018-03-30 MED ORDER — SODIUM CHLORIDE 0.9 % IV SOLN: 250.0000 mL | INTRAVENOUS | Status: DC | PRN

## 2018-03-30 MED ORDER — HEPARIN SODIUM (PORCINE) 1000 UNIT/ML IJ SOLN
INTRAMUSCULAR | Status: AC
  Filled 2018-03-30: qty 1

## 2018-03-30 MED ORDER — MIDAZOLAM HCL 2 MG/2ML IJ SOLN
INTRAMUSCULAR | Status: AC
  Filled 2018-03-30: qty 2

## 2018-03-30 MED ORDER — HEPARIN SODIUM (PORCINE) 1000 UNIT/ML IJ SOLN
INTRAMUSCULAR | Status: DC | PRN
  Administered 2018-03-30: 10000 [IU] via INTRAVENOUS

## 2018-03-30 MED ORDER — MIDAZOLAM HCL 2 MG/2ML IJ SOLN
INTRAMUSCULAR | Status: DC | PRN
  Administered 2018-03-30: 2 mg via INTRAVENOUS

## 2018-03-30 MED ORDER — ACETAMINOPHEN 325 MG PO TABS: 650.0000 mg | ORAL_TABLET | ORAL | Status: DC | PRN

## 2018-03-30 MED ORDER — SODIUM CHLORIDE 0.9% FLUSH: 3.0000 mL | Freq: Two times a day (BID) | INTRAVENOUS | Status: DC

## 2018-03-30 MED ORDER — FENTANYL CITRATE (PF) 100 MCG/2ML IJ SOLN
INTRAMUSCULAR | Status: AC
  Filled 2018-03-30: qty 2

## 2018-03-30 MED ORDER — LABETALOL HCL 5 MG/ML IV SOLN
INTRAVENOUS | Status: AC
  Filled 2018-03-30: qty 4

## 2018-03-30 MED ORDER — LABETALOL HCL 5 MG/ML IV SOLN
10.0000 mg | INTRAVENOUS | Status: DC | PRN
  Administered 2018-03-30: 10 mg via INTRAVENOUS

## 2018-03-30 MED ORDER — LIDOCAINE HCL (PF) 1 % IJ SOLN
INTRAMUSCULAR | Status: AC
  Filled 2018-03-30: qty 30

## 2018-03-30 MED ORDER — HYDRALAZINE HCL 20 MG/ML IJ SOLN
5.0000 mg | INTRAMUSCULAR | Status: DC | PRN
  Administered 2018-03-30: 22:00:00 5 mg via INTRAVENOUS
  Filled 2018-03-30: qty 1

## 2018-03-30 SURGICAL SUPPLY — 22 items
CATH OMNI FLUSH 5F 65CM (CATHETERS) ×3 IMPLANT
CATH SOFT-VU 4F 65 STRAIGHT (CATHETERS) ×2 IMPLANT
CATH SOFT-VU STRAIGHT 4F 65CM (CATHETERS) ×3
COVER PRB 48X5XTLSCP FOLD TPE (BAG) ×2 IMPLANT
COVER PROBE 5X48 (BAG) ×3
DEVICE CONTINUOUS FLUSH (MISCELLANEOUS) ×6 IMPLANT
DEVICE TORQUE H2O (MISCELLANEOUS) ×3 IMPLANT
DRAPE ZERO GRAVITY STERILE (DRAPES) ×3 IMPLANT
GUIDEWIRE ANGLED .035X150CM (WIRE) ×3 IMPLANT
KIT ENCORE 26 ADVANTAGE (KITS) ×4 IMPLANT
KIT MICROPUNCTURE NIT STIFF (SHEATH) ×3 IMPLANT
KIT PV (KITS) ×3 IMPLANT
SHEATH BRITE TIP 7FR 35CM (SHEATH) ×6 IMPLANT
SHEATH PINNACLE 5F 10CM (SHEATH) ×3 IMPLANT
SHEATH PINNACLE 6F 10CM (SHEATH) ×3 IMPLANT
STENT VIABAHNBX 8X59X80 (Permanent Stent) ×4 IMPLANT
SYR MEDRAD MARK V 150ML (SYRINGE) ×3 IMPLANT
TRANSDUCER W/STOPCOCK (MISCELLANEOUS) ×3 IMPLANT
TRAY PV CATH (CUSTOM PROCEDURE TRAY) ×3 IMPLANT
WIRE BENTSON .035X145CM (WIRE) ×3 IMPLANT
WIRE ROSEN-J .035X260CM (WIRE) ×3 IMPLANT
WIRE TORQFLEX AUST .018X40CM (WIRE) ×3 IMPLANT

## 2018-03-30 NOTE — Op Note (Signed)
Patient name: Albert Powell MRN: [id number] DOB: 1940-10-07 Sex: male  03/30/2018 Pre-operative Diagnosis: Bilateral claudication Post-operative diagnosis:  Same Surgeon:  Durene Cal Procedure Performed:  1.  Ultrasound-guided access, right femoral artery  2.  Ultrasound-guided access, left femoral artery  3.  Abdominal aortogram  4.  Stent, left common iliac artery  5.  Stent, right common iliac artery  6.  Conscious sedation of (54 minutes)    Indications: The patient is having difficulty walking as his legs give out at approximately 50 feet and improved with rest.  He is being considered for correction of the spinal stenosis, however prior to proceeding, given his history of vascular disease we decided to proceed with angiography.  He did have an ultrasound that suggested aortoiliac occlusive disease.  He had a stent placed by Dr. Allyson Sabal in 2014.  Procedure:  The patient was identified in the holding area and taken to room 8.  The patient was then placed supine on the table and prepped and draped in the usual sterile fashion.  A time out was called.  Conscious sedation was administered with use of IV fentanyl and Versed under continuous physician and nurse monitoring.  Heart rate, blood pressure, and oxygen saturation were continuously monitored.  Ultrasound was used to evaluate the right common femoral artery.  It was patent .  A digital ultrasound image was acquired.  A micropuncture needle was used to access the right common femoral artery under ultrasound guidance.  An 018 wire was advanced without resistance and a micropuncture sheath was placed.  The 018 wire was removed and a benson wire was placed.  The micropuncture sheath was exchanged for a 5 french sheath.  An omniflush catheter was advanced over the wire to the level of L-1.  An abdominal angiogram was obtained.  The catheter was pulled down to the aortic bifurcation and pelvic angiography and different obliquities was  performed.  Findings:   Aortogram: Greater than 80% left renal artery stenosis.  The infrarenal abdominal aorta is heavily calcified.  There is significant calcification with associated stenosis, greater than 80% within the origin of bilateral common iliac arteries.  The stent was in the right iliac artery remains patent.  There is a 70% stenosis origin of the right hypogastric artery.  Left hypogastric artery is occluded.  Bilateral external iliac arteries are calcified but appear to be patent without significant stenosis.    Intervention: After the above images were acquired the decision was made to proceed with intervention.  I needed to get access from the left side.  The left common femoral artery was evaluated ultrasound, the lot of pain.  It was cannulated under ultrasound guidance with a micropuncture needle.  An 018 wire was advanced followed by micropuncture sheath.  I then used a Glidewire to navigate through the iliac stenosis.  The micropuncture sheath was removed and a straight catheter was placed.  I then inserted a Rosen wire.  A 7 French 35 cm bright tip sheath was then placed.  On the right side, a Rosen wire was placed in the 6 [place] sheath was exchanged out for a 7 French 25 cm bright tip sheath.  The patient was then fully heparinized.  I selected a 8 x 38 GORE VBX stents and deployed them just above the aortic bifurcation.  Balloons were taken to rated pressure.  Completion imaging was then performed.  There was resolution of the aortic bifurcation stenosis and proximal iliac stenosis.  At this  point, the wires were removed.  The patient was taken the whole year for sheath pull once evaluation profile corrects.  Impression:  #1 high-grade left renal artery stenosis  #2 bilateral proximal common iliac artery lesions, greater than 80% successfully treated using VBX 8 x 59 stents bilaterally with no residual stenosis    V. Durene Cal, M.D. V[facility] Specialists of  [place] Office: [phone] Pager:  [phone]

## 2018-03-30 NOTE — Progress Notes (Signed)
Report called and transferred to 6-c

## 2018-03-30 NOTE — Progress Notes (Signed)
Client up to bathroom and hematoma noted left groin after walking; pressure held x and groin softer and not painful now Dr Edilia Bo notified and orders noted

## 2018-03-30 NOTE — Progress Notes (Signed)
Site area: left groin fa sheath pulled and pressure held by Araceli Bouche, RN Site Prior to Removal:  Level 0 Pressure Applied For: 25 minutes Manual:   yes Patient Status During Pull:  stable Post Pull Site:  Level 0 Post Pull Instructions Given:  yes Post Pull Pulses Present: left dp palpable Dressing Applied:  Gauze and tegaderm Bedrest begins @ 1550 Comments:

## 2018-03-30 NOTE — Progress Notes (Signed)
Site area: rt groin fa sheath pulled and pressure held by [employer] Prior to Removal:  Level 0 Pressure Applied For: 25 minutes Manual:   yes Patient Status During Pull:  stable Post Pull Site:  Level 0 Post Pull Instructions Given:  yes Post Pull Pulses Present:  Rt dp palpable Dressing Applied:  Gauze and tegaderm Bedrest begins @  Comments:

## 2018-03-30 NOTE — Telephone Encounter (Signed)
sch appt lvm 04/21/18 8am Aorto/iliac 9am ABI  05/03/18 830am p/o MD

## 2018-03-30 NOTE — Interval H&P Note (Signed)
History and Physical Interval Note:  03/30/2018 10:14 AM  Albert Powell  has presented today for surgery, with the diagnosis of pvd with claudication  The various methods of treatment have been discussed with the patient and family. After consideration of risks, benefits and other options for treatment, the patient has consented to  Procedure(s): ABDOMINAL AORTOGRAM W/LOWER EXTREMITY (N/A) as a surgical intervention .  The patient's history has been reviewed, patient examined, no change in status, stable for surgery.  I have reviewed the patient's chart and labs.  Questions were answered to the patient's satisfaction.     Durene Cal

## 2018-03-31 ENCOUNTER — Other Ambulatory Visit: Payer: Self-pay

## 2018-03-31 ENCOUNTER — Encounter (HOSPITAL_COMMUNITY): Payer: Self-pay | Admitting: Surgery

## 2018-03-31 DIAGNOSIS — J449 Chronic obstructive pulmonary disease, unspecified: Secondary | ICD-10-CM | POA: Diagnosis not present

## 2018-03-31 DIAGNOSIS — I739 Peripheral vascular disease, unspecified: Secondary | ICD-10-CM

## 2018-03-31 DIAGNOSIS — E1151 Type 2 diabetes mellitus with diabetic peripheral angiopathy without gangrene: Secondary | ICD-10-CM | POA: Diagnosis not present

## 2018-03-31 DIAGNOSIS — Z48812 Encounter for surgical aftercare following surgery on the circulatory system: Secondary | ICD-10-CM

## 2018-03-31 DIAGNOSIS — I70213 Atherosclerosis of native arteries of extremities with intermittent claudication, bilateral legs: Secondary | ICD-10-CM | POA: Diagnosis not present

## 2018-03-31 DIAGNOSIS — I10 Essential (primary) hypertension: Secondary | ICD-10-CM | POA: Diagnosis not present

## 2018-03-31 DIAGNOSIS — E785 Hyperlipidemia, unspecified: Secondary | ICD-10-CM | POA: Diagnosis not present

## 2018-03-31 DIAGNOSIS — I701 Atherosclerosis of renal artery: Secondary | ICD-10-CM | POA: Diagnosis not present

## 2018-03-31 LAB — POCT ACTIVATED CLOTTING TIME
ACTIVATED CLOTTING TIME: 169 s
Activated Clotting Time: 186 seconds

## 2018-03-31 LAB — GLUCOSE, CAPILLARY: Glucose-Capillary: 124 mg/dL — ABNORMAL HIGH (ref 70–99)

## 2018-03-31 LAB — MRSA PCR SCREENING: MRSA by PCR: NEGATIVE

## 2018-03-31 MED ORDER — ANGIOPLASTY BOOK
Freq: Once | Status: AC
  Administered 2018-03-31: 06:00:00 1
  Filled 2018-03-31: qty 1

## 2018-03-31 MED FILL — Heparin Sod (Porcine)-NaCl IV Soln 1000 Unit/500ML-0.9%: INTRAVENOUS | Qty: 500 | Status: AC

## 2018-03-31 NOTE — Discharge Summary (Signed)
Discharge Summary    Albert Powell 05/27/1941 77 y.o. male  [id number]  Admission Date: 03/30/2018  Discharge Date: 03/31/18  Physician: Chuck Hint, *  Admission Diagnosis: pvd with claudication   HPI:   This is a 77 y.o. male  who I performed left carotid endarterectomy on 06/02/2013 for asymptomatic stenosis.  Intraoperative findings included a 95% stenosis.  The patient has a history of lower extremity vascular disease.  On 10/20/2013 he underwent atherectomy and stenting of his right external iliac artery by Dr. Gery Pray.  The patient is continuing to have pain in his buttocks thigh and calf with walking approximately 50 feet.  He does not get pain but rather numbness as if his legs are going to give out.  He can rest for 3 or 4 minutes and the symptoms go away and he can go another 50 feet before they recur.  The patient has spinal stenosis and is being evaluated by Dr. Shon Baton for intervention.  His preprocedure imaging showed heavily calcified vasculature.  He was referred for further evaluation    Hospital Course:  The patient was admitted to the hospital and taken to the operating room on 03/30/2018 and underwent: Procedure Performed:             1.  Ultrasound-guided access, right femoral artery             2.  Ultrasound-guided access, left femoral artery             3.  Abdominal aortogram             4.  Stent, left common iliac artery             5.  Stent, right common iliac artery             6.  Conscious sedation of (54 minutes)  Findings:              Aortogram: Greater than 80% left renal artery stenosis.  The infrarenal abdominal aorta is heavily calcified.  There is significant calcification with associated stenosis, greater than 80% within the origin of bilateral common iliac arteries.  The stent was in the right iliac artery remains patent.  There is a 70% stenosis origin of the right hypogastric artery.  Left hypogastric artery is occluded.   Bilateral external iliac arteries are calcified but appear to be patent without significant stenosis.  The pt tolerated the procedure well and was transported to the PACU in good condition.   As he was getting ready for discharge he went to the bathroom and developed a small hematoma in the left groin.  Pressure was held for 20 minutes and this improved significantly.  However, given that it was late in the day, his age, and the fact that he lives fairly far away I elected to keep him overnight.   His groin looks fine this morning he can be discharged today.  I will notify Dr. Durene Cal of his admission.  On POD 1, he is doing well and discharged home.   The remainder of the hospital course consisted of increasing mobilization and increasing intake of solids without difficulty.  CBC    Component Value Date/Time   WBC 9.1 10/21/2013 0407   RBC 4.38 10/21/2013 0407   HGB 12.6 (L) 03/30/2018 0848   HCT 37.0 (L) 03/30/2018 0848   PLT 228 10/21/2013 0407   MCV 90.0 10/21/2013 0407   MCH 30.8 10/21/2013 0407   MCHC 34.3  10/21/2013 0407   RDW 13.7 10/21/2013 0407   LYMPHSABS 1.3 06/04/2013 1210   MONOABS 0.9 06/04/2013 1210   EOSABS 0.1 06/04/2013 1210   BASOSABS 0.0 06/04/2013 1210    BMET    Component Value Date/Time   NA 139 03/30/2018 0848   K 3.3 (L) 03/30/2018 0848   CL 96 (L) 03/30/2018 0848   CO2 26 10/21/2013 0407   GLUCOSE 143 (H) 03/30/2018 0848   BUN 17 03/30/2018 0848   CREATININE 1.50 (H) 03/30/2018 0848   CREATININE 0.94 10/13/2013 1534   CALCIUM 9.4 10/21/2013 0407   GFRNONAA 71 (L) 10/21/2013 0407   GFRAA 82 (L) 10/21/2013 0407      Discharge Instructions    Discharge patient   Complete by:  As directed    Discharge disposition:  01-Home or Self Care   Discharge patient date:  03/31/2018      Discharge Diagnosis:  pvd with claudication  Secondary Diagnosis: Patient Active Problem List   Diagnosis Date Noted  . Hematoma 03/30/2018  .  Hypogonadism in male 12/07/2014  . Screening for prostate cancer 12/07/2014  . Claudication of right lower extremity 10/20/2013  . Claudication in peripheral vascular disease- Rt leg 09/20/2013  . Obesity (BMI 30-39.9)- negative sleep study in the past 09/20/2013  . Occlusion and stenosis of carotid artery without mention of cerebral infarction 05/23/2013  . Peripheral arterial disease ([name]) 05/18/2013  . Carotid artery disease ([name]) 04/14/2013  . Bradycardia, sinus 02/18/2013  . Hyperlipidemia 02/05/2013  . Tobacco abuse 02/05/2013  . Chest pain, unstable angina, negative MI 02/04/2013  . HTN (hypertension) 02/04/2013  . DM (diabetes mellitus) ([name]) 02/04/2013  . CAD hx of 2 stents to RCA in 2003,  80-90% stenosis of LAD/CFX June 2014- medical Rx 02/04/2013  . Incisional hernia 05/22/2011   Past Medical History:  Diagnosis Date  . Bradycardia, sinus 02/18/2013  . CAD (coronary artery disease)    stent to RCA 2003 also had 40% lesion at that time; NUCLEAR STRESS TEST, 01/16/2010 - normal  now with cath 80-90% stenosis in LAD, will try medical therapy if no improvement PCI  . Carotid artery disease ([name])   . Cataract   . Claudication ([name])    LEA DUPLEX, 07/07/2008 - Normal  . COPD (chronic obstructive pulmonary disease) ([name])   . DDD (degenerative disc disease) 2008  . Diabetes mellitus   . GERD (gastroesophageal reflux disease)   . H/O hiatal hernia   . History of colonoscopy 2004   finding of tics and AMV only no polpys  . Hyperlipidemia 02/05/2013  . Hypertension   . Onychomycosis   . Rosacea   . Tobacco abuse 02/05/2013     Allergies as of 03/31/2018      Reactions   Fentanyl Other (See Comments)   Behavioral changes   Gabapentin Other (See Comments)   ankle swells   Lisinopril Other (See Comments)   unknown   Lyrica [pregabalin] Other (See Comments)   unknown   Metformin Diarrhea      Medication List    TAKE these medications   amLODipine 10 MG tablet Commonly  known as:  NORVASC Take 10 mg by mouth at bedtime.   atorvastatin 40 MG tablet Commonly known as:  LIPITOR Take 40 mg by mouth at bedtime.   clopidogrel 75 MG tablet Commonly known as:  PLAVIX TAKE 1 TABLET (75 MG TOTAL) BY MOUTH DAILY. What changed:  when to take this   escitalopram 20 MG tablet Commonly  known as:  LEXAPRO Take 20 mg by mouth daily.   Fluticasone-Salmeterol 100-50 MCG/DOSE Aepb Commonly known as:  ADVAIR Inhale 1 puff into the lungs daily.   glipiZIDE 5 MG tablet Commonly known as:  GLUCOTROL Take 5 mg by mouth daily at 12 noon.   isosorbide mononitrate 60 MG 24 hr tablet Commonly known as:  IMDUR Take 1 tablet (60 mg total) by mouth daily.   losartan-hydrochlorothiazide 100-25 MG tablet Commonly known as:  HYZAAR Take 1 tablet by mouth at bedtime.   metoprolol succinate 25 MG 24 hr tablet Commonly known as:  TOPROL-XL TAKE 1/2 TABLET BY MOUTH DAILY What changed:    how much to take  how to take this  when to take this   pantoprazole 40 MG tablet Commonly known as:  PROTONIX Take 40 mg by mouth daily.       Prescriptions given: none  Instructions:   [facility]  Discharge Instructions  Lower Extremity Angiogram; Angioplasty/Stenting  Please refer to the following instructions for your post-procedure care. Your surgeon or physician assistant will discuss any changes with you.  Activity  Avoid lifting more than 8 pounds (1 gallons of milk) for 72 hours (3 days) after your procedure. You may walk as much as you can tolerate. It's OK to drive after 72 hours.  Bathing/Showering  You may shower the day after your procedure. If you have a bandage, you may remove it at 24- 48 hours. Clean your incision site with mild soap and water. Pat the area dry with a clean towel.  Diet  Resume your pre-procedure diet. There are no special food restrictions following this procedure. All patients with peripheral  vascular disease should follow a low fat/low cholesterol diet. In order to heal from your surgery, it is CRITICAL to get adequate nutrition. Your body requires vitamins, minerals, and protein. Vegetables are the best source of vitamins and minerals. Vegetables also provide the perfect balance of protein. Processed food has little nutritional value, so try to avoid this.  Medications  Resume taking all of your medications unless your doctor tells you not to. If your incision is causing pain, you may take over-the-counter pain relievers such as acetaminophen (Tylenol)  Follow Up  Follow up will be arranged at the time of your procedure. You may have an office visit scheduled or may be scheduled for surgery. Ask your surgeon if you have any questions.  Please call us immediately for any of the following conditions: .Severe or worsening pain your legs or feet at rest or with walking. .Increased pain, redness, drainage at your groin puncture site. .Fever of 101 degrees or higher. .If you have any mild or slow bleeding from your puncture site: lie down, apply firm constant pressure over the area with a piece of gauze or a clean wash cloth for 30 minutes- no peeking!, call 911 right away if you are still bleeding after 30 minutes, or if the bleeding is heavy and unmanageable.  Reduce your risk factors of vascular disease:  . Stop smoking. If you would like help call [facility] at 1-800-QUIT-NOW (419-549-3628) or [place] at 805-004-1534. . Manage your cholesterol . Maintain a desired weight . Control your diabetes . Keep your blood pressure down .  If you have any questions, please call the office at [phone]  Disposition: home  Patient's condition: is Good  Follow up: 1. Dr. [name] in 4 weeks   Doreatha Massed, PA-C [facility] [phone]  03/31/2018  8:36 AM

## 2018-03-31 NOTE — Discharge Instructions (Signed)
° °  [facility] ° °Discharge Instructions ° °Lower Extremity Angiogram; Angioplasty/Stenting ° °Please refer to the following instructions for your post-procedure care. Your surgeon or physician assistant will discuss any changes with you. ° °Activity ° °Avoid lifting more than 8 pounds (1 gallons of milk) for 72 hours (3 days) after your procedure. You may walk as much as you can tolerate. It's OK to drive after 72 hours. ° °Bathing/Showering ° °You may shower the day after your procedure. If you have a bandage, you may remove it at 24- 48 hours. Clean your incision site with mild soap and water. Pat the area dry with a clean towel. ° °Diet ° °Resume your pre-procedure diet. There are no special food restrictions following this procedure. All patients with peripheral vascular disease should follow a low fat/low cholesterol diet. In order to heal from your surgery, it is CRITICAL to get adequate nutrition. Your body requires vitamins, minerals, and protein. Vegetables are the best source of vitamins and minerals. Vegetables also provide the perfect balance of protein. Processed food has little nutritional value, so try to avoid this. ° °Medications ° °Resume taking all of your medications unless your doctor tells you not to. If your incision is causing pain, you may take over-the-counter pain relievers such as acetaminophen (Tylenol) ° °Follow Up ° °Follow up will be arranged at the time of your procedure. You may have an office visit scheduled or may be scheduled for surgery. Ask your surgeon if you have any questions. ° °Please call us immediately for any of the following conditions: °•Severe or worsening pain your legs or feet at rest or with walking. °•Increased pain, redness, drainage at your groin puncture site. °•Fever of 101 degrees or higher. °•If you have any mild or slow bleeding from your puncture site: lie down, apply firm constant pressure over the area with a piece of  gauze or a clean wash cloth for 30 minutes- no peeking!, call 911 right away if you are still bleeding after 30 minutes, or if the bleeding is heavy and unmanageable. ° °Reduce your risk factors of vascular disease: ° °Stop smoking. If you would like help call [facility] at 1-800-QUIT-NOW (1-800-784-8669) or [place] at 336-586-4000. °Manage your cholesterol °Maintain a desired weight °Control your diabetes °Keep your blood pressure down ° °If you have any questions, please call the office at [phone] ° °

## 2018-03-31 NOTE — Progress Notes (Signed)
   VASCULAR SURGERY ASSESSMENT & PLAN:   The patient underwent bilateral common iliac artery stents yesterday.  As he was getting ready for discharge he went to the bathroom and developed a small hematoma in the left groin.  Pressure was held for 20 minutes and this improved significantly.  However, given that it was late in the day, his age, and the fact that he lives fairly far away I elected to keep him overnight.   His groin looks fine this morning he can be discharged today.  I will notify Dr. Durene Cal of his admission.  SUBJECTIVE:   No complaints this morning.  PHYSICAL EXAM:   Vitals:   03/30/18 2300 03/31/18 0000 03/31/18 0100 03/31/18 0627  BP: (!) 177/53 (!) 154/66 (!) 154/68 (!) 163/62  Pulse: 75 77 78 81  Resp: 15 16 18 16   Temp:    98.4 F (36.9 C)  TempSrc:    Oral  SpO2: 98% 98% 92% 99%  Weight:    240 lb 4.8 oz (109 kg)  Height:       Groins are soft without significant hematoma.  LABS:   CBG (last 3)  Recent Labs    03/30/18 1252 03/30/18 2045 03/30/18 2109  GLUCAP 115* 193* 188*    PROBLEM LIST:    Active Problems:   Hematoma   CURRENT MEDS:   . sodium chloride flush  3 mL Intravenous Q12H    Waverly Ferrari Beeper: [phone] Office: [phone] 03/31/2018

## 2018-04-05 DIAGNOSIS — E1121 Type 2 diabetes mellitus with diabetic nephropathy: Secondary | ICD-10-CM | POA: Diagnosis not present

## 2018-04-05 DIAGNOSIS — Z7984 Long term (current) use of oral hypoglycemic drugs: Secondary | ICD-10-CM | POA: Diagnosis not present

## 2018-04-05 DIAGNOSIS — I739 Peripheral vascular disease, unspecified: Secondary | ICD-10-CM | POA: Diagnosis not present

## 2018-04-05 DIAGNOSIS — M5416 Radiculopathy, lumbar region: Secondary | ICD-10-CM | POA: Diagnosis not present

## 2018-04-05 DIAGNOSIS — M5136 Other intervertebral disc degeneration, lumbar region: Secondary | ICD-10-CM | POA: Diagnosis not present

## 2018-04-05 DIAGNOSIS — E1159 Type 2 diabetes mellitus with other circulatory complications: Secondary | ICD-10-CM | POA: Diagnosis not present

## 2018-04-12 ENCOUNTER — Encounter (HOSPITAL_COMMUNITY): Payer: Medicare Other

## 2018-04-21 ENCOUNTER — Ambulatory Visit (HOSPITAL_COMMUNITY)
Admit: 2018-04-21 | Discharge: 2018-04-21 | Disposition: A | Discharge status: Home / Self Care | Payer: Medicare Other | Source: Ambulatory Visit | Attending: Surgery | Admitting: Surgery

## 2018-04-21 ENCOUNTER — Ambulatory Visit (INDEPENDENT_AMBULATORY_CARE_PROVIDER_SITE_OTHER)
Admit: 2018-04-21 | Discharge: 2018-04-21 | Disposition: A | Discharge status: Home / Self Care | Payer: Medicare Other | Attending: Surgery | Admitting: Surgery

## 2018-04-21 DIAGNOSIS — Z48812 Encounter for surgical aftercare following surgery on the circulatory system: Secondary | ICD-10-CM

## 2018-04-21 DIAGNOSIS — I739 Peripheral vascular disease, unspecified: Secondary | ICD-10-CM

## 2018-04-21 DIAGNOSIS — Z95828 Presence of other vascular implants and grafts: Secondary | ICD-10-CM | POA: Diagnosis not present

## 2018-05-03 ENCOUNTER — Other Ambulatory Visit: Payer: Self-pay

## 2018-05-03 ENCOUNTER — Ambulatory Visit (INDEPENDENT_AMBULATORY_CARE_PROVIDER_SITE_OTHER): Payer: Medicare Other | Admitting: Surgery

## 2018-05-03 ENCOUNTER — Encounter: Payer: Self-pay | Admitting: Surgery

## 2018-05-03 VITALS — BP 136/74 | HR 64 | Temp 97.6°F | Resp 20 | Ht 69.0 in | Wt 240.6 lb

## 2018-05-03 DIAGNOSIS — I70213 Atherosclerosis of native arteries of extremities with intermittent claudication, bilateral legs: Secondary | ICD-10-CM | POA: Diagnosis not present

## 2018-05-03 DIAGNOSIS — I6523 Occlusion and stenosis of bilateral carotid arteries: Secondary | ICD-10-CM | POA: Diagnosis not present

## 2018-05-03 NOTE — Progress Notes (Signed)
[facility]  Patient name: Albert Powell MRN: [id number] DOB: Jun 07, 1941 Sex: male   REASON FOR VISIT:    Follow up  HISOTRY OF PRESENT ILLNESS:   [name] is a 77 y.o. male who I performed left carotid endarterectomy on 06/02/2013 for asymptomatic stenosis.  Intraoperative findings included a 95% stenosis.  The patient has a history of lower extremity vascular disease.  On 10/20/2013 he underwent atherectomy and stenting of his right external iliac artery by Dr. Allyson Sabal.  The patient is continuing to have pain in his buttocks thigh and calf with walking approximately 50 feet.  He does not get pain but rather numbness as if his legs are going to give out.  He can rest for 3 or 4 minutes and the symptoms go away and he can go another 50 feet before they recur.  The patient has spinal stenosis and is being evaluated by Dr. Shon Baton for intervention.  On 03-30-2018 he underwent angiography and had stenting of bilateral common iliac arteries for > 80% stenosis.  He was kept overnight for a hematoma.  He states that his walking distance has significantly improved after stenting.  He does not think he is going to require back surgery now.[name]  He continues to take a statin for hypercholesterolemia.  He is on dual anti-platelet therapy   PAST MEDICAL HISTORY:   Past Medical History:  Diagnosis Date  . Bradycardia, sinus 02/18/2013  . CAD (coronary artery disease)    stent to RCA 2003 also had 40% lesion at that time; NUCLEAR STRESS TEST, 01/16/2010 - normal  now with cath 80-90% stenosis in LAD, will try medical therapy if no improvement PCI  . Carotid artery disease ([name])   . Cataract   . Claudication ([name])    LEA DUPLEX, 07/07/2008 - Normal  . COPD (chronic obstructive pulmonary disease) ([name])   . DDD (degenerative disc disease) 2008  . Diabetes mellitus   . GERD (gastroesophageal reflux disease)   . H/O hiatal hernia   .  History of colonoscopy 2004   finding of tics and AMV only no polpys  . Hyperlipidemia 02/05/2013  . Hypertension   . Onychomycosis   . Rosacea   . Tobacco abuse 02/05/2013     FAMILY HISTORY:   Family History  Problem Relation Age of Onset  . Heart disease Father        heart attack at 33  . Heart attack Father   . Hyperlipidemia Father   . Colonic polyp Mother        that bled out  . COPD Mother   . Diabetes Mother   . Heart disease Sister   . Colonic polyp Sister   . Stroke Maternal Grandfather   . Heart attack Paternal Grandfather   . Other Neg Hx        hypogonadism    SOCIAL HISTORY:   Social History   Tobacco Use  . Smoking status: Former Smoker    Packs/day: 0.75    Years: 57.00    Pack years: 42.75    Types: Cigarettes    Last attempt to quit: 04/06/2013    Years since quitting: 5.0  . Smokeless tobacco: Former Neurosurgeon    Quit date: 04/14/2013  . Tobacco comment: pt states that he is using the vapor cigs  Substance Use Topics  . Alcohol use: Yes    Alcohol/week: 1.0 standard drinks    Types: 1 Glasses of wine per week  ALLERGIES:   Allergies  Allergen Reactions  . Fentanyl Other (See Comments)    Behavioral changes  . Gabapentin Other (See Comments)    ankle swells  . Lisinopril Other (See Comments)    unknown  . Lyrica [Pregabalin] Other (See Comments)    unknown  . Metformin Diarrhea     CURRENT MEDICATIONS:   Current Outpatient Medications  Medication Sig Dispense Refill  . amLODipine (NORVASC) 10 MG tablet Take 10 mg by mouth at bedtime.     [name] atorvastatin (LIPITOR) 40 MG tablet Take 40 mg by mouth at bedtime.     . clopidogrel (PLAVIX) 75 MG tablet TAKE 1 TABLET (75 MG TOTAL) BY MOUTH DAILY. (Patient taking differently: Take 75 mg by mouth at bedtime. ) 90 tablet 2  . escitalopram (LEXAPRO) 20 MG tablet Take 20 mg by mouth daily.    . Fluticasone-Salmeterol (ADVAIR) 100-50 MCG/DOSE AEPB Inhale 1 puff into the lungs daily.     [name]  glipiZIDE (GLUCOTROL) 5 MG tablet Take 5 mg by mouth daily at 12 noon.     . isosorbide mononitrate (IMDUR) 60 MG 24 hr tablet Take 1 tablet (60 mg total) by mouth daily.    [name] losartan-hydrochlorothiazide (HYZAAR) 100-25 MG per tablet Take 1 tablet by mouth at bedtime.     . metoprolol succinate (TOPROL-XL) 25 MG 24 hr tablet TAKE 1/2 TABLET BY MOUTH DAILY (Patient taking differently: Take 12.5 mg by mouth at bedtime) 15 tablet 6  . pantoprazole (PROTONIX) 40 MG tablet Take 40 mg by mouth daily.     No current facility-administered medications for this visit.     REVIEW OF SYSTEMS:   [X]  denotes positive finding, [ ]  denotes negative finding Cardiac  Comments:  Chest pain or chest pressure:    Shortness of breath upon exertion:    Short of breath when lying flat:    Irregular heart rhythm:        Vascular    Pain in calf, thigh, or hip brought on by ambulation: x   Pain in feet at night that wakes you up from your sleep:     Blood clot in your veins:    Leg swelling:         Pulmonary    Oxygen at home:    Productive cough:     Wheezing:         Neurologic    Sudden weakness in arms or legs:     Sudden numbness in arms or legs:     Sudden onset of difficulty speaking or slurred speech:    Temporary loss of vision in one eye:     Problems with dizziness:         Gastrointestinal    Blood in stool:     Vomited blood:         Genitourinary    Burning when urinating:     Blood in urine:        Psychiatric    Major depression:         Hematologic    Bleeding problems:    Problems with blood clotting too easily:        Skin    Rashes or ulcers:        Constitutional    Fever or chills:      PHYSICAL EXAM:   There were no vitals filed for this visit.  GENERAL: The patient is a well-nourished male, in no acute distress. The vital signs are  documented above. CARDIAC: There is a regular rate and rhythm.  VASCULAR: Palpable left dorsalis pedis pulse.  Faintly  palpable right. PULMONARY: Non-labored respirations ABDOMEN: Soft and non-tender with normal pitched bowel sounds.  MUSCULOSKELETAL: There are no major deformities or cyanosis. NEUROLOGIC: No focal weakness or paresthesias are detected. SKIN: There are no ulcers or rashes noted. PSYCHIATRIC: The patient has a normal affect.  STUDIES:   I have ordered and reviewed the following vascular lab studies:  ABI Findings: Right = 0.82, toe pressure = 154 Left= 0.94  Toe pressure =132  )WaveformThrombusComments +-------------+-------+----------+----------+--------+--------+--------+ RT CIA Prox          148    biphasic         +-------------+-------+----------+----------+--------+--------+--------+ RT CIA Mid           120    biphasic         +-------------+-------+----------+----------+--------+--------+--------+ RT CIA Distal         160    biphasic         +-------------+-------+----------+----------+--------+--------+--------+ RT EIA Prox          329    biphasic         +-------------+-------+----------+----------+--------+--------+--------+ RT EIA Mid           208    biphasic         +-------------+-------+----------+----------+--------+--------+--------+ RT EIA Distal         221    biphasic         +-------------+-------+----------+----------+--------+--------+--------+ LT CIA Prox          141    biphasic         +-------------+-------+----------+----------+--------+--------+--------+ LT CIA Mid           120    biphasic         +-------------+-------+----------+----------+--------+--------+--------+ LT CIA Distal         150    biphasic         +-------------+-------+----------+----------+--------+--------+--------+ LT EIA Prox           209    biphasic         +-------------+-------+----------+----------+--------+--------+--------+ LT EIA Mid           87    biphasic         +-------------+-------+----------+----------+--------+--------+--------+ LT EIA Distal         368    biphasic         +-------------+-------+----------+----------+--------+--------+--------+  MEDICAL ISSUES:   Status post bilateral iliac stenting: The patient has had a significant improvement with his symptoms after stenting.  He will be placed on surveillance protocol.  He is having his carotid followed up with Dr. Faythe Dingwall, MD [facility] [phone] Pager [phone]

## 2018-05-27 DIAGNOSIS — E785 Hyperlipidemia, unspecified: Secondary | ICD-10-CM | POA: Diagnosis not present

## 2018-05-27 DIAGNOSIS — F325 Major depressive disorder, single episode, in full remission: Secondary | ICD-10-CM | POA: Diagnosis not present

## 2018-05-27 DIAGNOSIS — J449 Chronic obstructive pulmonary disease, unspecified: Secondary | ICD-10-CM | POA: Diagnosis not present

## 2018-05-27 DIAGNOSIS — J441 Chronic obstructive pulmonary disease with (acute) exacerbation: Secondary | ICD-10-CM | POA: Diagnosis not present

## 2018-05-27 DIAGNOSIS — I10 Essential (primary) hypertension: Secondary | ICD-10-CM | POA: Diagnosis not present

## 2018-05-27 DIAGNOSIS — E1159 Type 2 diabetes mellitus with other circulatory complications: Secondary | ICD-10-CM | POA: Diagnosis not present

## 2018-05-27 DIAGNOSIS — I2510 Atherosclerotic heart disease of native coronary artery without angina pectoris: Secondary | ICD-10-CM | POA: Diagnosis not present

## 2018-05-27 DIAGNOSIS — Z7984 Long term (current) use of oral hypoglycemic drugs: Secondary | ICD-10-CM | POA: Diagnosis not present

## 2018-07-02 DIAGNOSIS — Z23 Encounter for immunization: Secondary | ICD-10-CM | POA: Diagnosis not present

## 2018-07-06 DIAGNOSIS — E1159 Type 2 diabetes mellitus with other circulatory complications: Secondary | ICD-10-CM | POA: Diagnosis not present

## 2018-07-15 DIAGNOSIS — H5213 Myopia, bilateral: Secondary | ICD-10-CM | POA: Diagnosis not present

## 2018-07-15 DIAGNOSIS — H25013 Cortical age-related cataract, bilateral: Secondary | ICD-10-CM | POA: Diagnosis not present

## 2018-07-15 DIAGNOSIS — E119 Type 2 diabetes mellitus without complications: Secondary | ICD-10-CM | POA: Diagnosis not present

## 2018-07-30 DIAGNOSIS — E1121 Type 2 diabetes mellitus with diabetic nephropathy: Secondary | ICD-10-CM | POA: Diagnosis not present

## 2018-07-30 DIAGNOSIS — E785 Hyperlipidemia, unspecified: Secondary | ICD-10-CM | POA: Diagnosis not present

## 2018-07-30 DIAGNOSIS — I10 Essential (primary) hypertension: Secondary | ICD-10-CM | POA: Diagnosis not present

## 2018-07-30 DIAGNOSIS — I2510 Atherosclerotic heart disease of native coronary artery without angina pectoris: Secondary | ICD-10-CM | POA: Diagnosis not present

## 2018-07-30 DIAGNOSIS — E1159 Type 2 diabetes mellitus with other circulatory complications: Secondary | ICD-10-CM | POA: Diagnosis not present

## 2018-07-30 DIAGNOSIS — J441 Chronic obstructive pulmonary disease with (acute) exacerbation: Secondary | ICD-10-CM | POA: Diagnosis not present

## 2018-07-30 DIAGNOSIS — E1359 Other specified diabetes mellitus with other circulatory complications: Secondary | ICD-10-CM | POA: Diagnosis not present

## 2018-07-30 DIAGNOSIS — J449 Chronic obstructive pulmonary disease, unspecified: Secondary | ICD-10-CM | POA: Diagnosis not present

## 2018-07-30 DIAGNOSIS — F325 Major depressive disorder, single episode, in full remission: Secondary | ICD-10-CM | POA: Diagnosis not present

## 2018-09-02 DIAGNOSIS — H25011 Cortical age-related cataract, right eye: Secondary | ICD-10-CM | POA: Diagnosis not present

## 2018-09-02 DIAGNOSIS — H268 Other specified cataract: Secondary | ICD-10-CM | POA: Diagnosis not present

## 2018-09-02 DIAGNOSIS — H25811 Combined forms of age-related cataract, right eye: Secondary | ICD-10-CM | POA: Diagnosis not present

## 2018-09-17 ENCOUNTER — Other Ambulatory Visit: Payer: Self-pay | Admitting: *Deleted

## 2018-09-17 DIAGNOSIS — I739 Peripheral vascular disease, unspecified: Secondary | ICD-10-CM

## 2018-09-30 DIAGNOSIS — H25812 Combined forms of age-related cataract, left eye: Secondary | ICD-10-CM | POA: Diagnosis not present

## 2018-09-30 DIAGNOSIS — H2512 Age-related nuclear cataract, left eye: Secondary | ICD-10-CM | POA: Diagnosis not present

## 2018-10-05 DIAGNOSIS — M79641 Pain in right hand: Secondary | ICD-10-CM | POA: Diagnosis not present

## 2018-10-05 DIAGNOSIS — S60221A Contusion of right hand, initial encounter: Secondary | ICD-10-CM | POA: Diagnosis not present

## 2018-10-05 DIAGNOSIS — M25511 Pain in right shoulder: Secondary | ICD-10-CM | POA: Diagnosis not present

## 2018-10-13 DIAGNOSIS — E1121 Type 2 diabetes mellitus with diabetic nephropathy: Secondary | ICD-10-CM | POA: Diagnosis not present

## 2018-10-13 DIAGNOSIS — J449 Chronic obstructive pulmonary disease, unspecified: Secondary | ICD-10-CM | POA: Diagnosis not present

## 2018-10-13 DIAGNOSIS — E1359 Other specified diabetes mellitus with other circulatory complications: Secondary | ICD-10-CM | POA: Diagnosis not present

## 2018-10-13 DIAGNOSIS — J441 Chronic obstructive pulmonary disease with (acute) exacerbation: Secondary | ICD-10-CM | POA: Diagnosis not present

## 2018-10-13 DIAGNOSIS — I10 Essential (primary) hypertension: Secondary | ICD-10-CM | POA: Diagnosis not present

## 2018-10-13 DIAGNOSIS — E1159 Type 2 diabetes mellitus with other circulatory complications: Secondary | ICD-10-CM | POA: Diagnosis not present

## 2018-10-13 DIAGNOSIS — I2510 Atherosclerotic heart disease of native coronary artery without angina pectoris: Secondary | ICD-10-CM | POA: Diagnosis not present

## 2018-10-13 DIAGNOSIS — F325 Major depressive disorder, single episode, in full remission: Secondary | ICD-10-CM | POA: Diagnosis not present

## 2018-10-13 DIAGNOSIS — E785 Hyperlipidemia, unspecified: Secondary | ICD-10-CM | POA: Diagnosis not present

## 2018-11-01 ENCOUNTER — Encounter (HOSPITAL_COMMUNITY): Payer: Medicare Other

## 2018-11-01 ENCOUNTER — Ambulatory Visit: Payer: Medicare Other | Admitting: Family

## 2018-11-12 DIAGNOSIS — M25531 Pain in right wrist: Secondary | ICD-10-CM | POA: Diagnosis not present

## 2018-11-12 DIAGNOSIS — M180 Bilateral primary osteoarthritis of first carpometacarpal joints: Secondary | ICD-10-CM | POA: Diagnosis not present

## 2018-11-12 DIAGNOSIS — M25532 Pain in left wrist: Secondary | ICD-10-CM | POA: Diagnosis not present

## 2018-11-12 DIAGNOSIS — S60221A Contusion of right hand, initial encounter: Secondary | ICD-10-CM | POA: Diagnosis not present

## 2018-11-17 ENCOUNTER — Telehealth (HOSPITAL_COMMUNITY): Payer: Self-pay | Admitting: *Deleted

## 2018-11-17 NOTE — Telephone Encounter (Signed)
Left VM to remind of appt 

## 2018-11-18 ENCOUNTER — Ambulatory Visit (HOSPITAL_COMMUNITY)
Admission: RE | Admit: 2018-11-18 | Discharge: 2018-11-18 | Disposition: A | Discharge status: Home / Self Care | Payer: Medicare Other | Source: Ambulatory Visit | Attending: Surgery | Admitting: Surgery

## 2018-11-18 ENCOUNTER — Ambulatory Visit (INDEPENDENT_AMBULATORY_CARE_PROVIDER_SITE_OTHER)
Admission: RE | Admit: 2018-11-18 | Discharge: 2018-11-18 | Disposition: A | Discharge status: Home / Self Care | Payer: Medicare Other | Source: Ambulatory Visit | Attending: Surgery | Admitting: Surgery

## 2018-11-18 DIAGNOSIS — I739 Peripheral vascular disease, unspecified: Secondary | ICD-10-CM

## 2018-11-25 ENCOUNTER — Ambulatory Visit (INDEPENDENT_AMBULATORY_CARE_PROVIDER_SITE_OTHER): Payer: Medicare Other | Admitting: Family

## 2018-11-25 ENCOUNTER — Encounter: Payer: Self-pay | Admitting: Family

## 2018-11-25 ENCOUNTER — Other Ambulatory Visit: Payer: Self-pay

## 2018-11-25 VITALS — BP 124/71 | HR 63 | Ht 69.0 in | Wt 236.0 lb

## 2018-11-25 DIAGNOSIS — Z95828 Presence of other vascular implants and grafts: Secondary | ICD-10-CM

## 2018-11-25 DIAGNOSIS — I771 Stricture of artery: Secondary | ICD-10-CM | POA: Diagnosis not present

## 2018-11-25 DIAGNOSIS — I779 Disorder of arteries and arterioles, unspecified: Secondary | ICD-10-CM | POA: Diagnosis not present

## 2018-11-25 DIAGNOSIS — Z9889 Other specified postprocedural states: Secondary | ICD-10-CM

## 2018-11-25 DIAGNOSIS — I6523 Occlusion and stenosis of bilateral carotid arteries: Secondary | ICD-10-CM | POA: Diagnosis not present

## 2018-11-25 DIAGNOSIS — Z87891 Personal history of nicotine dependence: Secondary | ICD-10-CM

## 2018-11-25 NOTE — Progress Notes (Signed)
[facility] [place]   CC: Follow up peripheral artery occlusive disease  History of Present Illness Albert Powell is a 78 y.o. male who is s/p left carotid endarterectomy on 06/02/2013 by Dr. [name] for asymptomatic stenosis. Intraoperative findings included a 95% stenosis. The patient has a history of lower extremity vascular disease. On 10/20/2013 he underwent atherectomy and stenting of his right external iliac artery by Dr. Allyson Sabal. The patient continued to have pain in his buttocks thigh and calf with walking approximately 50 feet.  On 03-30-2018 he underwent angiography and had stenting of bilateral common iliac arteries by Dr. [name] for > 80% stenosis.  He was kept overnight for a hematoma.  He was last evaluated by Dr. [name] on 05-03-18. At that time the patient had a significant improvement of his symptoms after stenting.  He was to be placed on surveillance protocol.  The patient has spinal stenosis and was evaluated by Dr. Shon Baton.  He states that his walking distance has significantly improved after stenting.  He does not think he is going to require back surgery now.    Serum creatinine 1.5 on 03-30-18 (review of records).   Pt states his hips and legs feel weak after walking about 10 minutes, relieved by a short rest.   He denies any known hx of stroke or TIA.   Diabetic: Yes, pt states his last A1C was 7.2. He takes Ozempic for his DM, which is not on his list. Tobacco use: former smoker, quit in 2014, smoked x 57 years  Pt meds include: Statin :Yes Betablocker: Yes ASA: No Other anticoagulants/antiplatelets: Plavix  Past Medical History:  Diagnosis Date  . Bradycardia, sinus 02/18/2013  . CAD (coronary artery disease)    stent to RCA 2003 also had 40% lesion at that time; NUCLEAR STRESS TEST, 01/16/2010 - normal  now with cath 80-90% stenosis in LAD, will try medical therapy if no improvement PCI  . Carotid artery disease ([name])   . Cataract    . Claudication ([name])    LEA DUPLEX, 07/07/2008 - Normal  . COPD (chronic obstructive pulmonary disease) ([name])   . DDD (degenerative disc disease) 2008  . Diabetes mellitus   . GERD (gastroesophageal reflux disease)   . H/O hiatal hernia   . History of colonoscopy 2004   finding of tics and AMV only no polpys  . Hyperlipidemia 02/05/2013  . Hypertension   . Onychomycosis   . Rosacea   . Tobacco abuse 02/05/2013    Social History Social History   Tobacco Use  . Smoking status: Former Smoker    Packs/day: 0.75    Years: 57.00    Pack years: 42.75    Types: Cigarettes    Last attempt to quit: 04/06/2013    Years since quitting: 5.6  . Smokeless tobacco: Former Neurosurgeon    Quit date: 04/14/2013  . Tobacco comment: pt states that he is using the vapor cigs  Substance Use Topics  . Alcohol use: Yes    Alcohol/week: 1.0 standard drinks    Types: 1 Glasses of wine per week  . Drug use: No    Family History Family History  Problem Relation Age of Onset  . Heart disease Father        heart attack at 40  . Heart attack Father   . Hyperlipidemia Father   . Colonic polyp Mother        that bled out  . COPD Mother   . Diabetes  Mother   . Heart disease Sister   . Colonic polyp Sister   . Stroke Maternal Grandfather   . Heart attack Paternal Grandfather   . Other Neg Hx        hypogonadism    Past Surgical History:  Procedure Laterality Date  . ABDOMINAL AORTOGRAM W/LOWER EXTREMITY N/A 03/30/2018   Procedure: ABDOMINAL AORTOGRAM W/LOWER EXTREMITY;  Surgeon: Nada Libman, MD;  Location: [facility];  Service: Cardiovascular;  Laterality: N/A;  . APPENDECTOMY    . CARDIAC CATHETERIZATION  08/29/2002   2-vessel CAD with high-grade stenoses in RCA; stenting to RCA  . CARDIAC CATHETERIZATION  2014   80-90% lesion will try medical therapy  . CARDIAC SURGERY     stents  2003  . CARDIOVERSION N/A 11/01/2013   Procedure: Dimple Nanas COMPRESSION;  Surgeon: [name], MD;  Location: [facility] CATH LAB;  Service: Cardiovascular;  Laterality: N/A;  . CAROTID ANGIOGRAM N/A 04/25/2013   Procedure: CAROTID ANGIOGRAM;  Surgeon: [name], MD;  Location: [facility] CATH LAB;  Service: Cardiovascular;  Laterality: N/A;  . CAROTID ENDARTERECTOMY    . COLON RESECTION  3/04, 6/04    1 bleeding diverticulitis, 2 complete colectomy  . COLON SURGERY  2005   renal pouch rectal anastimosis  . CORONARY ANGIOPLASTY WITH STENT PLACEMENT  09/2002   2 stents to RCA  . ENDARTERECTOMY Left 06/02/2013   Procedure: ENDARTERECTOMY CAROTID-LEFT;  Surgeon: Nada Libman, MD;  Location: [facility] OR;  Service: Vascular;  Laterality: Left;  . LEFT HEART CATHETERIZATION WITH CORONARY ANGIOGRAM N/A 02/08/2013   Procedure: LEFT HEART CATHETERIZATION WITH CORONARY ANGIOGRAM;  Surgeon: [name], MD;  Location: [facility] CATH LAB;  Service: Cardiovascular;  Laterality: N/A;  . LOWER EXTREMITY ANGIOGRAM N/A 10/20/2013   Procedure: LOWER EXTREMITY ANGIOGRAM;  Surgeon: [name], MD;  Location: [facility] CATH LAB;  Service: Cardiovascular;  Laterality: N/A;  . PATCH ANGIOPLASTY Left 06/02/2013   Procedure: PATCH ANGIOPLASTY;  Surgeon: Nada Libman, MD;  Location: [facility] OR;  Service: Vascular;  Laterality: Left;  . PERIPHERAL VASCULAR INTERVENTION Bilateral 03/30/2018   Procedure: PERIPHERAL VASCULAR INTERVENTION;  Surgeon: Nada Libman, MD;  Location: [facility] CV [facility];  Service: Cardiovascular;  Laterality: Bilateral;  common iliacs  . TONSILLECTOMY      Allergies  Allergen Reactions  . Fentanyl Other (See Comments)    Behavioral changes  . Gabapentin Other (See Comments)    ankle swells  . Lisinopril Other (See Comments)    unknown  . Lyrica [Pregabalin] Other (See Comments)    unknown  . Metformin Diarrhea  . Propofol Other (See Comments)    Heart rate dropped  . Shellfish Allergy Other (See Comments)    Other Other-Mild skin reaction to shrimp    Current Outpatient Medications   Medication Sig Dispense Refill  . amLODipine (NORVASC) 10 MG tablet Take 10 mg by mouth at bedtime.     [name] atorvastatin (LIPITOR) 40 MG tablet Take 40 mg by mouth at bedtime.     . clopidogrel (PLAVIX) 75 MG tablet TAKE 1 TABLET (75 MG TOTAL) BY MOUTH DAILY. (Patient taking differently: Take 75 mg by mouth at bedtime. ) 90 tablet 2  . escitalopram (LEXAPRO) 20 MG tablet Take 20 mg by mouth daily.    . Fluticasone-Salmeterol (ADVAIR) 100-50 MCG/DOSE AEPB Inhale 1 puff into the lungs daily.     [name] glipiZIDE (GLUCOTROL) 5 MG tablet Take 5 mg by mouth daily at 12  noon.     . isosorbide mononitrate (IMDUR) 60 MG 24 hr tablet Take 1 tablet (60 mg total) by mouth daily.    [name] losartan-hydrochlorothiazide (HYZAAR) 100-25 MG per tablet Take 1 tablet by mouth at bedtime.     . metoprolol succinate (TOPROL-XL) 25 MG 24 hr tablet TAKE 1/2 TABLET BY MOUTH DAILY (Patient taking differently: Take 12.5 mg by mouth at bedtime) 15 tablet 6  . pantoprazole (PROTONIX) 40 MG tablet Take 40 mg by mouth daily.     No current facility-administered medications for this visit.     ROS: See HPI for pertinent positives and negatives.   Physical Examination  Vitals:   11/25/18 1015  BP: 124/71  Pulse: 63  SpO2: 96%  Weight: 236 lb (107 kg)  Height:  (1.753 m)   Respirations 18/minute   Body mass index is 34.85 kg/m.  General: A&O x 3, WDWN, obese male. Gait: normal HENT: No gross abnormalities. Large neck Eyes: PERRLA. Pulmonary: Respirations are non labored, CTAB, good air movement in all fields Cardiac: regular rhythm, no detected murmur.         Carotid Bruits Right Left   Negative Negative   Radial pulses are 1+ palpable bilaterally   Adominal aortic pulse is not palpable                         VASCULAR EXAM: Extremities without ischemic changes, without Gangrene; without open wounds.                                                                                                           LE Pulses Right Left       FEMORAL  2+ palpable  1+ palpable        POPLITEAL  not palpable   not palpable       POSTERIOR TIBIAL  not palpable   2+ palpable        DORSALIS PEDIS      ANTERIOR TIBIAL not palpable  palpable    Abdomen: softly obese, NT, large asymptomatic hernia at LLQ Skin: no rashes, no cellulitis, no ulcers noted. Musculoskeletal: no muscle wasting or atrophy.  Neurologic: A&O X 3; appropriate affect, Sensation is normal; MOTOR FUNCTION:  moving all extremities equally, motor strength 5/5 throughout. Speech is fluent/normal. CN 2-12 intact. Psychiatric: Thought content is normal, mood appropriate for clinical situation.     ASSESSMENT: Albert Powell is a 78 y.o. male who is s/p left carotid endarterectomy on 06/02/2013 by Dr. [name] for asymptomatic stenosis. Intraoperative findings included a 95% stenosis. The patient has a history of lower extremity vascular disease. On 10/20/2013 he underwent atherectomy and stenting of his right external iliac artery by Dr. Allyson Sabal. The patient continued to have pain in his buttocks thigh and calf with walking approximately 50 feet.  On 03-30-2018 he underwent angiography and had stenting of bilateral common iliac arteries by Dr. [name] for > 80% stenosis.  His claudication sx's have improved, there are no signs of ischemia in  his feet or legs.   Bilateral extracranial carotid artery stenosis, s./p left CEA in 2014: According to Dr. Estanislado Spire note on 05-03-18, Dr. Allyson Sabal is monitoring pt's carotid arteries. Pt states he is to see Dr. Allyson Sabal in the next 1-2 months.   DATA  Bilateral Iliac Artery Duplex (11-25-18): Abdominal Aorta Findings: +-------------+-------+----------+----------+--------+--------+--------+ Location     AP (cm)Trans (cm)PSV (cm/s)WaveformThrombusComments +-------------+-------+----------+----------+--------+--------+--------+ RT CIA Prox  338.0                      biphasic                  +-------------+-------+----------+----------+--------+--------+--------+ RT CIA Mid   259.0                      biphasic                 +-------------+-------+----------+----------+--------+--------+--------+ RT EIA Prox  188.0                      biphasic                 +-------------+-------+----------+----------+--------+--------+--------+ RT EIA Mid   167.0                      biphasic                 +-------------+-------+----------+----------+--------+--------+--------+ RT EIA Distal128.0                      biphasic                 +-------------+-------+----------+----------+--------+--------+--------+ LT CIA Prox  244.0                      biphasic                 +-------------+-------+----------+----------+--------+--------+--------+ LT CIA Mid   209.0                      biphasic                 +-------------+-------+----------+----------+--------+--------+--------+ LT CIA Distal222.0                      biphasic                 +-------------+-------+----------+----------+--------+--------+--------+ LT EIA Prox  145.0                      biphasic                 +-------------+-------+----------+----------+--------+--------+--------+ LT EIA Mid   165.0                      biphasic                 +-------------+-------+----------+----------+--------+--------+--------+ LT EIA Distal179.0                      biphasic                 +-------------+-------+----------+----------+--------+--------+--------+  Visualization of the Right CIA Distal artery was limited.   Summary: Stenosis: +--------------------+---------------+ Location            Stent           +--------------------+---------------+ Right Common Iliac  50-99% stenosis +--------------------+---------------+ Left Common Iliac  50-99% stenosis +--------------------+---------------+ Right External Iliac1-49%  stenosis  +--------------------+---------------+ Left External Iliac 1-49% stenosis  +--------------------+---------------+ Visualization of the bilateral iliac stents is suboptimal due to overlying bowel gas and patient body habitus.     ABI (Date: 11/25/2018): ABI Findings: +---------+------------------+-----+--------+--------+ Right    Rt Pressure (mmHg)IndexWaveformComment  +---------+------------------+-----+--------+--------+ PTA      115               0.73 biphasic         +---------+------------------+-----+--------+--------+ DP       120               0.76 biphasic         +---------+------------------+-----+--------+--------+ Great Toe86                0.55                  +---------+------------------+-----+--------+--------+  +---------+------------------+-----+---------+-------+ Left     Lt Pressure (mmHg)IndexWaveform Comment +---------+------------------+-----+---------+-------+ Brachial 157                                     +---------+------------------+-----+---------+-------+ PTA      137               0.87 triphasic        +---------+------------------+-----+---------+-------+ DP       134               0.85 triphasic        +---------+------------------+-----+---------+-------+ Great Toe91                0.58                  +---------+------------------+-----+---------+-------+  +-------+-----------+-----------+------------+------------+ ABI/TBIToday's ABIToday's TBIPrevious ABIPrevious TBI +-------+-----------+-----------+------------+------------+ Right  0.76       0.55       0.82        0.97         +-------+-----------+-----------+------------+------------+ Left   0.87       0.58       0.94        0.84         +-------+-----------+-----------+------------+------------+  Bilateral ABIs appear mildly decreased.   Summary: Right: Resting right ankle-brachial index  indicates moderate right lower extremity arterial disease. The right toe-brachial index is abnormal.  Left: Resting left ankle-brachial index indicates mild left lower extremity arterial disease. The left toe-brachial index is abnormal.    PLAN:  Based on the patient's vascular studies and examination, pt will return to clinic in 3 months with bilateral iliac artery duplex and ABI's.   I advised pt to notify us if he develops concerns re the circulation in his feet or legs.   Walk a total of at least 30 minutes daily in a safe environment.   I discussed in depth with the patient the nature of atherosclerosis, and emphasized the importance of maximal medical management including strict control of blood pressure, blood glucose, and lipid levels, obtaining regular exercise, and continued cessation of smoking.  The patient is aware that without maximal medical management the underlying atherosclerotic disease process will progress, limiting the benefit of any interventions.  The patient was given information about PAD including signs, symptoms, treatment, what symptoms should prompt the patient to seek immediate medical care, and risk reduction measures to take.  Charisse March, RN, MSN, FNP-C [facility] [place] Phone: [phone]  Clinic  MD: Darrick Penna  11/25/18 10:32 AM

## 2018-11-25 NOTE — Patient Instructions (Signed)
Before your next abdominal ultrasound:  Avoid gas forming foods and beverages the day before the test.   Take two Extra-Strength Gas-X capsules at bedtime the night before the test. Take another two Extra-Strength Gas-X capsules in the middle of the night if you get up to the restroom, if not, first thing in the morning with water.  Do not chew gum.      Peripheral Vascular Disease  Peripheral vascular disease (PVD) is a disease of the blood vessels that are not part of your heart and brain. A simple term for PVD is poor circulation. In most cases, PVD narrows the blood vessels that carry blood from your heart to the rest of your body. This can reduce the supply of blood to your arms, legs, and internal organs, like your stomach or kidneys. However, PVD most often affects a person's lower legs and feet. Without treatment, PVD tends to get worse. PVD can also lead to acute ischemic limb. This is when an arm or leg suddenly cannot get enough blood. This is a medical emergency. Follow these instructions at home: Lifestyle  Do not use any products that contain nicotine or tobacco, such as cigarettes and e-cigarettes. If you need help quitting, ask your doctor.  Lose weight if you are overweight. Or, stay at a healthy weight as told by your doctor.  Eat a diet that is low in fat and cholesterol. If you need help, ask your doctor.  Exercise regularly. Ask your doctor for activities that are right for you. General instructions  Take over-the-counter and prescription medicines only as told by your doctor.  Take good care of your feet: ? Wear comfortable shoes that fit well. ? Check your feet often for any cuts or sores.  Keep all follow-up visits as told by your doctor This is important. Contact a doctor if:  You have cramps in your legs when you walk.  You have leg pain when you are at rest.  You have coldness in a leg or foot.  Your skin changes.  You are unable to get or have an  erection (erectile dysfunction).  You have cuts or sores on your feet that do not heal. Get help right away if:  Your arm or leg turns cold, numb, and blue.  Your arms or legs become red, warm, swollen, painful, or numb.  You have chest pain.  You have trouble breathing.  You suddenly have weakness in your face, arm, or leg.  You become very confused or you cannot speak.  You suddenly have a very bad headache.  You suddenly cannot see. Summary  Peripheral vascular disease (PVD) is a disease of the blood vessels.  A simple term for PVD is poor circulation. Without treatment, PVD tends to get worse.  Treatment may include exercise, low fat and low cholesterol diet, and quitting smoking. This information is not intended to replace advice given to you by your health care provider. Make sure you discuss any questions you have with your health care provider. Document Released: 11/26/2009 Document Revised: 10/09/2016 Document Reviewed: 10/09/2016 Elsevier Interactive Patient Education  2019 Elsevier Inc.  

## 2019-01-11 ENCOUNTER — Telehealth: Payer: Self-pay | Admitting: *Deleted

## 2019-01-11 NOTE — Telephone Encounter (Signed)
01/11/19 LMOM @ 3267 am,re: follow appointment.

## 2019-02-09 NOTE — Telephone Encounter (Signed)
Mrs. Stablein, patients wife stated she will call back to schedule.

## 2019-02-10 ENCOUNTER — Other Ambulatory Visit: Payer: Self-pay

## 2019-02-10 DIAGNOSIS — I779 Disorder of arteries and arterioles, unspecified: Secondary | ICD-10-CM

## 2019-02-11 DIAGNOSIS — E1121 Type 2 diabetes mellitus with diabetic nephropathy: Secondary | ICD-10-CM | POA: Diagnosis not present

## 2019-02-11 DIAGNOSIS — F325 Major depressive disorder, single episode, in full remission: Secondary | ICD-10-CM | POA: Diagnosis not present

## 2019-02-11 DIAGNOSIS — E1159 Type 2 diabetes mellitus with other circulatory complications: Secondary | ICD-10-CM | POA: Diagnosis not present

## 2019-02-11 DIAGNOSIS — J449 Chronic obstructive pulmonary disease, unspecified: Secondary | ICD-10-CM | POA: Diagnosis not present

## 2019-02-11 DIAGNOSIS — E785 Hyperlipidemia, unspecified: Secondary | ICD-10-CM | POA: Diagnosis not present

## 2019-02-11 DIAGNOSIS — I10 Essential (primary) hypertension: Secondary | ICD-10-CM | POA: Diagnosis not present

## 2019-02-11 DIAGNOSIS — E1359 Other specified diabetes mellitus with other circulatory complications: Secondary | ICD-10-CM | POA: Diagnosis not present

## 2019-02-11 DIAGNOSIS — I2510 Atherosclerotic heart disease of native coronary artery without angina pectoris: Secondary | ICD-10-CM | POA: Diagnosis not present

## 2019-02-11 DIAGNOSIS — J441 Chronic obstructive pulmonary disease with (acute) exacerbation: Secondary | ICD-10-CM | POA: Diagnosis not present

## 2019-02-14 ENCOUNTER — Encounter (HOSPITAL_COMMUNITY): Payer: Medicare Other

## 2019-02-14 ENCOUNTER — Ambulatory Visit: Payer: Medicare Other | Admitting: Family

## 2019-02-16 DIAGNOSIS — I10 Essential (primary) hypertension: Secondary | ICD-10-CM | POA: Diagnosis not present

## 2019-02-16 DIAGNOSIS — E1159 Type 2 diabetes mellitus with other circulatory complications: Secondary | ICD-10-CM | POA: Diagnosis not present

## 2019-02-16 DIAGNOSIS — E1359 Other specified diabetes mellitus with other circulatory complications: Secondary | ICD-10-CM | POA: Diagnosis not present

## 2019-02-16 DIAGNOSIS — F325 Major depressive disorder, single episode, in full remission: Secondary | ICD-10-CM | POA: Diagnosis not present

## 2019-02-16 DIAGNOSIS — E1121 Type 2 diabetes mellitus with diabetic nephropathy: Secondary | ICD-10-CM | POA: Diagnosis not present

## 2019-02-16 DIAGNOSIS — E785 Hyperlipidemia, unspecified: Secondary | ICD-10-CM | POA: Diagnosis not present

## 2019-02-16 DIAGNOSIS — J441 Chronic obstructive pulmonary disease with (acute) exacerbation: Secondary | ICD-10-CM | POA: Diagnosis not present

## 2019-02-16 DIAGNOSIS — J449 Chronic obstructive pulmonary disease, unspecified: Secondary | ICD-10-CM | POA: Diagnosis not present

## 2019-02-16 DIAGNOSIS — I2510 Atherosclerotic heart disease of native coronary artery without angina pectoris: Secondary | ICD-10-CM | POA: Diagnosis not present

## 2019-03-14 ENCOUNTER — Telehealth: Payer: Self-pay | Admitting: Cardiovascular Disease

## 2019-03-14 NOTE — Telephone Encounter (Signed)
Spoke with pt's wife who report for the last month, both pt's hand have been swelling. She report the left is worse than the right.  She state pt was seen by an orthopedic and hand specialist but both report nothings wrong.   Wife also report that she feels as if pt is declining both mobile and mentally. She state pt is falling more than normal and she is very concerned.  Wife also would like for pt to have a carotid doppler.  Will roue to MD for recommendations

## 2019-03-14 NOTE — Telephone Encounter (Signed)
New Message               Pt c/o swelling: STAT is pt has developed SOB within 24 hours  1) How much weight have you gained and in what time span? No gain  2) If swelling, where is the swelling located? Hands  3) Are you currently taking a fluid pill? Yes  4) Are you currently SOB? No, dizziness  5) Do you have a log of your daily weights (if so, list)? No   6) Have you gained 3 pounds in a day or 5 pounds in a week? No  7) Have you traveled recently? No       Patient's wife is concerned patient is a little slower mentually and falling.

## 2019-03-15 NOTE — Telephone Encounter (Signed)
Spoke with pt wife, Follow up scheduled  

## 2019-03-15 NOTE — Telephone Encounter (Signed)
Prob needs ROV in the next 1-2 months

## 2019-03-30 ENCOUNTER — Telehealth: Payer: Self-pay | Admitting: Cardiovascular Disease

## 2019-03-30 NOTE — Telephone Encounter (Signed)
LVM, reminding pt of his appt with  Dr Berry on 03-31-19. °

## 2019-03-31 ENCOUNTER — Other Ambulatory Visit: Payer: Self-pay

## 2019-03-31 ENCOUNTER — Encounter: Payer: Self-pay | Admitting: Cardiovascular Disease

## 2019-03-31 ENCOUNTER — Ambulatory Visit (INDEPENDENT_AMBULATORY_CARE_PROVIDER_SITE_OTHER): Payer: Medicare Other | Admitting: Cardiovascular Disease

## 2019-03-31 DIAGNOSIS — I779 Disorder of arteries and arterioles, unspecified: Secondary | ICD-10-CM

## 2019-03-31 DIAGNOSIS — E782 Mixed hyperlipidemia: Secondary | ICD-10-CM

## 2019-03-31 DIAGNOSIS — I2510 Atherosclerotic heart disease of native coronary artery without angina pectoris: Secondary | ICD-10-CM | POA: Diagnosis not present

## 2019-03-31 DIAGNOSIS — I10 Essential (primary) hypertension: Secondary | ICD-10-CM

## 2019-03-31 DIAGNOSIS — I739 Peripheral vascular disease, unspecified: Secondary | ICD-10-CM

## 2019-03-31 DIAGNOSIS — I6522 Occlusion and stenosis of left carotid artery: Secondary | ICD-10-CM

## 2019-03-31 NOTE — Assessment & Plan Note (Signed)
History of CAD status post stenting of the RCA back in 2003 with disease in the LAD and circumflex June 2014 which was treated medically.  He denies chest pain or shortness of breath.

## 2019-03-31 NOTE — Assessment & Plan Note (Signed)
History of carotid artery disease with angiography which I performed 04/25/2013 revealing 95% proximal left internal carotid artery stenosis which she underwent endarterectomy electively by Dr. Trula Slade 06/02/2013.

## 2019-03-31 NOTE — Assessment & Plan Note (Signed)
History of peripheral arterial disease status post diamondback orbital rotational atherectomy, PTA and stenting of the right external iliac artery by myself 10/20/2013.  He had bilateral iliac stenting by Dr. Trula Slade using VBX covered stents 03/30/2018 as well as renal artery stenting.

## 2019-03-31 NOTE — Patient Instructions (Signed)

## 2019-03-31 NOTE — Assessment & Plan Note (Signed)
History of essential hypertension with blood pressure measured today at 111/68.  He is on amlodipine, losartan, hydrochlorothiazide and metoprolol.

## 2019-03-31 NOTE — Progress Notes (Signed)
ekg 

## 2019-03-31 NOTE — Progress Notes (Signed)
03/31/2019 Albert FabianJames W Sevey   May 21, 1941  [id number]  Primary Physician Lorenda IshiharaVaradarajan, Rupashree, MD Primary Cardiologist: Runell GessJonathan J Tawnee Clegg MD Milagros LollFACP, FACC, [place], [place]  HPI:  Albert Powell is a 78 y.o.  moderately overweight married Caucasian male with known history of CAD. I last saw in the office 01/15/2018.[name]. He is status post stenting with 2 stents to RCA in 2003 and residual LAD, hypertension and hyperlipidemia and diabetes mellitus on diet, presented to the ER because of jaw discomfort with chest pressure for 2-3 days. Patient's symptoms usually happen in the night. .  Patient was negative for myocardial infarction he had a LexiScan Myoview 02/07/13 which was negative for ischemia, but due to similarities of his prior coronary disease and the symptoms and multiple risk factors for coronary disease including family history hypertension diabetes known coronary artery disease patient was scheduled for cardiac catheterization with Dr. Allyson SabalBerry. Revealing 80-90% focal distal LAD disease and a 1.5-1.7 mm vessel left circumflex nondominant with a 90% AV groove stenosis in a small vessel there was a moderate to large obtuse marginal branch with a 60% proximal segmental calcified stenosis. The RCA had widely patent stents. EF was 60%. Due to the small size of the vessels and the negative nuclear stress test that are very filled medical therapy was the patient's best option.  Patient was seen back in followup last week with a heart rate of 48 symptomatic with weakness feeling very tired. He still had some shortness of breath but no jaw pain which is his anginal equivalent. Medications were adjusted because Lopressor actually had him hold it one day and then he was started back half a tablet daily he felt better so he is now resumed half tablet twice a day.  Today's back for followup initially his heart rate was 60 after he walked in the room but after sitting for 10-15 minutes EKG revealed sinus bradycardia  rate of 49 but no acute changes otherwise. His blood pressure was improved from his last visit.  Additionally he denies any shortness of breath he denies any jaw pain.  Since he was seen by Nada BoozerLaura Ingold 02/24/13 and his beta blockers were adjusted he no longer is bradycardic or symptomatic. He denies chest pain or shortness of breath. I do hear a carotid bruit on exam and cardiac echo Doppler studies on him.  I performed carotid Dopplers on him 04/11/13 which revealed a moderately high-grade left ICA stenosis. He is neurologically asymptomatic. Based on the intermediate nature of his Doppler study is unclear to me whether he has a lesion that can be followed conservatively by ultrasound or requires surgical revascularization. Because of this I decided to proceed with carotid angiography to further define the degree of stenosis.this was performed on 04/25/13 revealing a 95% proximal left internal carotid artery stenosis which suddenly underwent endarterectomy by Dr. Myra GianottiBrabham on 06/02/13.I also performed angiography on his aortoiliac Arteries revealing 90% calcified right extrailiac artery stenosis.I performed diamondback or rotational atherectomy, PT and stenting of his right external iliac artery on 10/20/13. He had an excellent angiographic and clinical result. His post procedure Dopplers have improved. He did have a small pseudoaneurysm in his right groin which was successfully compressed at the hospital and has remained closed.  Since I saw me year ago his major complaints have been lifestyle limiting back and hip pain. He's had imaging studies that suggest spinal stenosis and wishes to undergo decompressive laminectomy by Dr. Shon BatonBrooks in the upcoming future.. He had lower  extremity Dopplers performed in our office 04/08/17 that showed a right ABI of 0.85 and a left aBI of 0.96.  I repeated his lower extremity arterial Doppler studies which showed some progression of disease however he ultimately underwent  angiography by Dr. [name] 03/30/2018, 2 months after his Dopplers, and had covered stenting of both iliac arteries.  He continues to have similar symptoms as he did prior to his intervention.  He denies chest pain or shortness of breath.   Current Meds  Medication Sig  . amLODipine (NORVASC) 10 MG tablet Take 10 mg by mouth at bedtime.   [name] atorvastatin (LIPITOR) 40 MG tablet Take 40 mg by mouth at bedtime.   . clopidogrel (PLAVIX) 75 MG tablet TAKE 1 TABLET (75 MG TOTAL) BY MOUTH DAILY. (Patient taking differently: Take 75 mg by mouth at bedtime. )  . escitalopram (LEXAPRO) 20 MG tablet Take 20 mg by mouth daily.  . Fluticasone-Salmeterol (ADVAIR) 100-50 MCG/DOSE AEPB Inhale 1 puff into the lungs daily.   [name] glipiZIDE (GLUCOTROL) 5 MG tablet Take 5 mg by mouth daily at 12 noon.   . isosorbide mononitrate (IMDUR) 60 MG 24 hr tablet Take 1 tablet (60 mg total) by mouth daily.  [name] losartan-hydrochlorothiazide (HYZAAR) 100-25 MG per tablet Take 1 tablet by mouth at bedtime.   . metoprolol succinate (TOPROL-XL) 25 MG 24 hr tablet TAKE 1/2 TABLET BY MOUTH DAILY (Patient taking differently: Take 12.5 mg by mouth at bedtime)  . pantoprazole (PROTONIX) 40 MG tablet Take 40 mg by mouth daily.     Allergies  Allergen Reactions  . Fentanyl Other (See Comments)    Behavioral changes  . Gabapentin Other (See Comments)    ankle swells  . Lisinopril Other (See Comments)    unknown  . Lyrica [Pregabalin] Other (See Comments)    unknown  . Metformin Diarrhea  . Propofol Other (See Comments)    Heart rate dropped  . Shellfish Allergy Other (See Comments)    Other Other-Mild skin reaction to shrimp    Social History   Socioeconomic History  . Marital status: Married    Spouse name: Not on file  . Number of children: Not on file  . Years of education: Not on file  . Highest education level: Not on file  Occupational History  . Not on file  Social Needs  . Financial resource strain: Not on file   . Food insecurity    Worry: Not on file    Inability: Not on file  . Transportation needs    Medical: Not on file    Non-medical: Not on file  Tobacco Use  . Smoking status: Former Smoker    Packs/day: 0.75    Years: 57.00    Pack years: 42.75    Types: Cigarettes    Quit date: 04/06/2013    Years since quitting: 5.9  . Smokeless tobacco: Former Neurosurgeon    Quit date: 04/14/2013  . Tobacco comment: pt states that he is using the vapor cigs  Substance and Sexual Activity  . Alcohol use: Yes    Alcohol/week: 1.0 standard drinks    Types: 1 Glasses of wine per week  . Drug use: No  . Sexual activity: Not on file  Lifestyle  . Physical activity    Days per week: Not on file    Minutes per session: Not on file  . Stress: Not on file  Relationships  . Social connections    Talks on phone: Not on  file    Gets together: Not on file    Attends religious service: Not on file    Active member of club or organization: Not on file    Attends meetings of clubs or organizations: Not on file    Relationship status: Not on file  . Intimate partner violence    Fear of current or ex partner: Not on file    Emotionally abused: Not on file    Physically abused: Not on file    Forced sexual activity: Not on file  Other Topics Concern  . Not on file  Social History Narrative  . Not on file     Review of Systems: General: negative for chills, fever, night sweats or weight changes.  Cardiovascular: negative for chest pain, dyspnea on exertion, edema, orthopnea, palpitations, paroxysmal nocturnal dyspnea or shortness of breath Dermatological: negative for rash Respiratory: negative for cough or wheezing Urologic: negative for hematuria Abdominal: negative for nausea, vomiting, diarrhea, bright red blood per rectum, melena, or hematemesis Neurologic: negative for visual changes, syncope, or dizziness All other systems reviewed and are otherwise negative except as noted above.    Blood  pressure 111/68, pulse 79, temperature (!) 97.4 F (36.3 C), height 5\' 9"  (1.753 m), weight 230 lb 12.8 oz (104.7 kg), SpO2 97 %.  General appearance: alert and no distress Neck: no adenopathy, no carotid bruit, no JVD, supple, symmetrical, trachea midline and thyroid not enlarged, symmetric, no tenderness/mass/nodules Lungs: clear to auscultation bilaterally Heart: regular rate and rhythm, S1, S2 normal, no murmur, click, rub or gallop Extremities: extremities normal, atraumatic, no cyanosis or edema Pulses: 2+ and symmetric Skin: Skin color, texture, turgor normal. No rashes or lesions Neurologic: Alert and oriented X 3, normal strength and tone. Normal symmetric reflexes. Normal coordination and gait  EKG not performed today  ASSESSMENT AND PLAN:   CAD hx of 2 stents to RCA in 2003,  80-90% stenosis of LAD/CFX June 2014- medical Rx History of CAD status post stenting of the RCA back in 2003 with disease in the LAD and circumflex June 2014 which was treated medically.  He denies chest pain or shortness of breath.  Hyperlipidemia History of hyperlipidemia on statin therapy with lipid profile performed 12/25/2017 revealing total cholesterol 117, LDL of 57 and HDL of 39.  HTN (hypertension) History of essential hypertension with blood pressure measured today at 111/68.  He is on amlodipine, losartan, hydrochlorothiazide and metoprolol.  Carotid artery disease History of carotid artery disease with angiography which I performed 04/25/2013 revealing 95% proximal left internal carotid artery stenosis which she underwent endarterectomy electively by Dr. Trula Slade 06/02/2013.  Peripheral arterial disease History of peripheral arterial disease status post diamondback orbital rotational atherectomy, PTA and stenting of the right external iliac artery by myself 10/20/2013.  He had bilateral iliac stenting by Dr. Trula Slade using VBX covered stents 03/30/2018 as well as renal artery stenting.       Lorretta Harp MD FACP,FACC,FAHA, [facility] 03/31/2019 2:27 PM

## 2019-03-31 NOTE — Assessment & Plan Note (Signed)
History of hyperlipidemia on statin therapy with lipid profile performed 12/25/2017 revealing total cholesterol 117, LDL of 57 and HDL of 39.

## 2019-04-25 DIAGNOSIS — E1359 Other specified diabetes mellitus with other circulatory complications: Secondary | ICD-10-CM | POA: Diagnosis not present

## 2019-04-25 DIAGNOSIS — J441 Chronic obstructive pulmonary disease with (acute) exacerbation: Secondary | ICD-10-CM | POA: Diagnosis not present

## 2019-04-25 DIAGNOSIS — E785 Hyperlipidemia, unspecified: Secondary | ICD-10-CM | POA: Diagnosis not present

## 2019-04-25 DIAGNOSIS — F325 Major depressive disorder, single episode, in full remission: Secondary | ICD-10-CM | POA: Diagnosis not present

## 2019-04-25 DIAGNOSIS — E1121 Type 2 diabetes mellitus with diabetic nephropathy: Secondary | ICD-10-CM | POA: Diagnosis not present

## 2019-04-25 DIAGNOSIS — I10 Essential (primary) hypertension: Secondary | ICD-10-CM | POA: Diagnosis not present

## 2019-04-25 DIAGNOSIS — E1159 Type 2 diabetes mellitus with other circulatory complications: Secondary | ICD-10-CM | POA: Diagnosis not present

## 2019-04-25 DIAGNOSIS — I2510 Atherosclerotic heart disease of native coronary artery without angina pectoris: Secondary | ICD-10-CM | POA: Diagnosis not present

## 2019-04-25 DIAGNOSIS — J449 Chronic obstructive pulmonary disease, unspecified: Secondary | ICD-10-CM | POA: Diagnosis not present

## 2019-04-27 DIAGNOSIS — Z23 Encounter for immunization: Secondary | ICD-10-CM | POA: Diagnosis not present

## 2019-04-27 DIAGNOSIS — R8290 Unspecified abnormal findings in urine: Secondary | ICD-10-CM | POA: Diagnosis not present

## 2019-04-27 DIAGNOSIS — I2510 Atherosclerotic heart disease of native coronary artery without angina pectoris: Secondary | ICD-10-CM | POA: Diagnosis not present

## 2019-04-27 DIAGNOSIS — I10 Essential (primary) hypertension: Secondary | ICD-10-CM | POA: Diagnosis not present

## 2019-04-27 DIAGNOSIS — E1159 Type 2 diabetes mellitus with other circulatory complications: Secondary | ICD-10-CM | POA: Diagnosis not present

## 2019-04-27 DIAGNOSIS — Z0000 Encounter for general adult medical examination without abnormal findings: Secondary | ICD-10-CM | POA: Diagnosis not present

## 2019-04-27 DIAGNOSIS — Z1389 Encounter for screening for other disorder: Secondary | ICD-10-CM | POA: Diagnosis not present

## 2019-05-25 DIAGNOSIS — Z23 Encounter for immunization: Secondary | ICD-10-CM | POA: Diagnosis not present

## 2019-06-07 DIAGNOSIS — F325 Major depressive disorder, single episode, in full remission: Secondary | ICD-10-CM | POA: Diagnosis not present

## 2019-06-07 DIAGNOSIS — J449 Chronic obstructive pulmonary disease, unspecified: Secondary | ICD-10-CM | POA: Diagnosis not present

## 2019-06-07 DIAGNOSIS — E785 Hyperlipidemia, unspecified: Secondary | ICD-10-CM | POA: Diagnosis not present

## 2019-06-07 DIAGNOSIS — J441 Chronic obstructive pulmonary disease with (acute) exacerbation: Secondary | ICD-10-CM | POA: Diagnosis not present

## 2019-06-07 DIAGNOSIS — E1159 Type 2 diabetes mellitus with other circulatory complications: Secondary | ICD-10-CM | POA: Diagnosis not present

## 2019-06-07 DIAGNOSIS — E1359 Other specified diabetes mellitus with other circulatory complications: Secondary | ICD-10-CM | POA: Diagnosis not present

## 2019-06-07 DIAGNOSIS — E1121 Type 2 diabetes mellitus with diabetic nephropathy: Secondary | ICD-10-CM | POA: Diagnosis not present

## 2019-06-07 DIAGNOSIS — I2510 Atherosclerotic heart disease of native coronary artery without angina pectoris: Secondary | ICD-10-CM | POA: Diagnosis not present

## 2019-06-07 DIAGNOSIS — I10 Essential (primary) hypertension: Secondary | ICD-10-CM | POA: Diagnosis not present

## 2019-07-04 DIAGNOSIS — E1121 Type 2 diabetes mellitus with diabetic nephropathy: Secondary | ICD-10-CM | POA: Diagnosis not present

## 2019-07-04 DIAGNOSIS — E785 Hyperlipidemia, unspecified: Secondary | ICD-10-CM | POA: Diagnosis not present

## 2019-07-04 DIAGNOSIS — J441 Chronic obstructive pulmonary disease with (acute) exacerbation: Secondary | ICD-10-CM | POA: Diagnosis not present

## 2019-07-04 DIAGNOSIS — E1359 Other specified diabetes mellitus with other circulatory complications: Secondary | ICD-10-CM | POA: Diagnosis not present

## 2019-07-04 DIAGNOSIS — I10 Essential (primary) hypertension: Secondary | ICD-10-CM | POA: Diagnosis not present

## 2019-07-04 DIAGNOSIS — J449 Chronic obstructive pulmonary disease, unspecified: Secondary | ICD-10-CM | POA: Diagnosis not present

## 2019-07-04 DIAGNOSIS — F325 Major depressive disorder, single episode, in full remission: Secondary | ICD-10-CM | POA: Diagnosis not present

## 2019-07-04 DIAGNOSIS — E1159 Type 2 diabetes mellitus with other circulatory complications: Secondary | ICD-10-CM | POA: Diagnosis not present

## 2019-07-04 DIAGNOSIS — I2510 Atherosclerotic heart disease of native coronary artery without angina pectoris: Secondary | ICD-10-CM | POA: Diagnosis not present

## 2019-07-27 DIAGNOSIS — E785 Hyperlipidemia, unspecified: Secondary | ICD-10-CM | POA: Diagnosis not present

## 2019-07-27 DIAGNOSIS — I10 Essential (primary) hypertension: Secondary | ICD-10-CM | POA: Diagnosis not present

## 2019-07-27 DIAGNOSIS — I2510 Atherosclerotic heart disease of native coronary artery without angina pectoris: Secondary | ICD-10-CM | POA: Diagnosis not present

## 2019-07-27 DIAGNOSIS — E1159 Type 2 diabetes mellitus with other circulatory complications: Secondary | ICD-10-CM | POA: Diagnosis not present

## 2019-07-27 DIAGNOSIS — E1121 Type 2 diabetes mellitus with diabetic nephropathy: Secondary | ICD-10-CM | POA: Diagnosis not present

## 2019-07-27 DIAGNOSIS — F325 Major depressive disorder, single episode, in full remission: Secondary | ICD-10-CM | POA: Diagnosis not present

## 2019-07-27 DIAGNOSIS — J441 Chronic obstructive pulmonary disease with (acute) exacerbation: Secondary | ICD-10-CM | POA: Diagnosis not present

## 2019-07-27 DIAGNOSIS — J449 Chronic obstructive pulmonary disease, unspecified: Secondary | ICD-10-CM | POA: Diagnosis not present

## 2019-07-27 DIAGNOSIS — E1359 Other specified diabetes mellitus with other circulatory complications: Secondary | ICD-10-CM | POA: Diagnosis not present

## 2019-08-29 DIAGNOSIS — M25522 Pain in left elbow: Secondary | ICD-10-CM | POA: Diagnosis not present

## 2019-08-29 DIAGNOSIS — M79641 Pain in right hand: Secondary | ICD-10-CM | POA: Diagnosis not present

## 2019-08-29 DIAGNOSIS — M25512 Pain in left shoulder: Secondary | ICD-10-CM | POA: Diagnosis not present

## 2019-08-29 DIAGNOSIS — M79642 Pain in left hand: Secondary | ICD-10-CM | POA: Diagnosis not present

## 2019-10-19 DIAGNOSIS — E1121 Type 2 diabetes mellitus with diabetic nephropathy: Secondary | ICD-10-CM | POA: Diagnosis not present

## 2019-10-19 DIAGNOSIS — E785 Hyperlipidemia, unspecified: Secondary | ICD-10-CM | POA: Diagnosis not present

## 2019-10-19 DIAGNOSIS — J449 Chronic obstructive pulmonary disease, unspecified: Secondary | ICD-10-CM | POA: Diagnosis not present

## 2019-10-19 DIAGNOSIS — E1159 Type 2 diabetes mellitus with other circulatory complications: Secondary | ICD-10-CM | POA: Diagnosis not present

## 2019-10-19 DIAGNOSIS — J441 Chronic obstructive pulmonary disease with (acute) exacerbation: Secondary | ICD-10-CM | POA: Diagnosis not present

## 2019-10-19 DIAGNOSIS — I2510 Atherosclerotic heart disease of native coronary artery without angina pectoris: Secondary | ICD-10-CM | POA: Diagnosis not present

## 2019-10-19 DIAGNOSIS — F325 Major depressive disorder, single episode, in full remission: Secondary | ICD-10-CM | POA: Diagnosis not present

## 2019-10-19 DIAGNOSIS — I10 Essential (primary) hypertension: Secondary | ICD-10-CM | POA: Diagnosis not present

## 2019-10-26 IMAGING — XA DG MYELOGRAPHY LUMBAR INJ LUMBOSACRAL
12 of 17 series · 12 of 17 positions shown · non-contrast
Comparison: CT abdomen pelvis 04/22/2011

CLINICAL DATA: Sixteen low back pain. Bilateral weakness. Pain and
cramping into the hips and proximal thighs.
TECHNIQUE: Contiguous axial images were obtained through the Lumbar spine after
the intrathecal infusion of infusion. Coronal and sagittal
reconstructions were obtained of the axial image sets.

[Series 1: vasc adipose · 1 of 1 slices shown (1 of 9)]
[im 1/1]
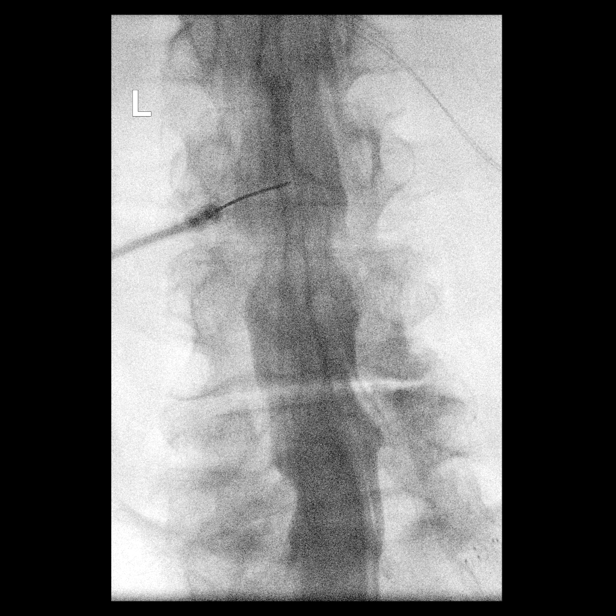

[Series 2: w lumbar spine lat · 0.15mm/px · 1 of 1 slices shown]
[im 1/1]
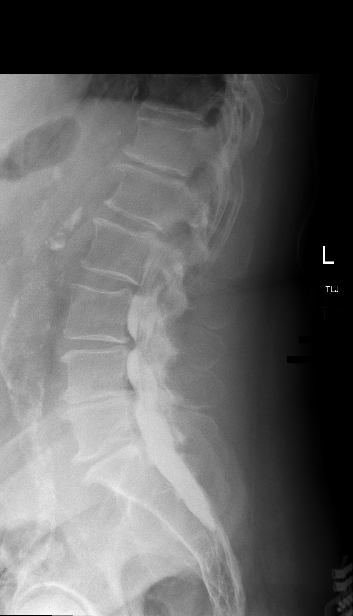

[Series 3: vasc adipose · 1 of 1 slices shown (2 of 9)]
[im 1/1]
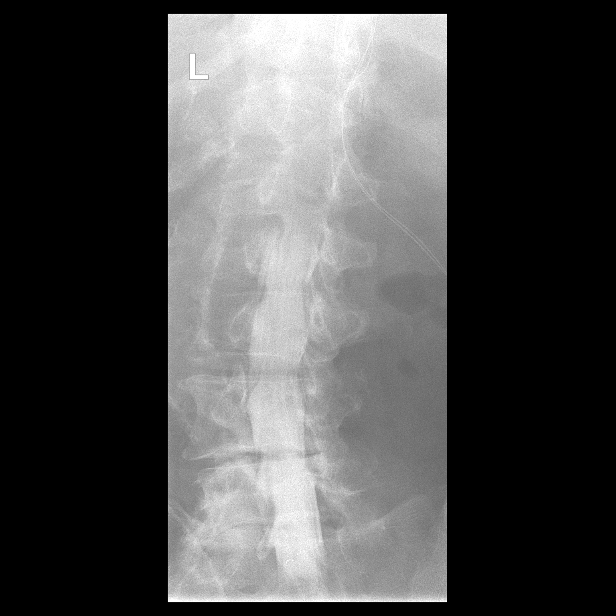

[Series 3: w lumbar spine flexion · 0.15mm/px · 1 of 1 slices shown]
[im 1/1]
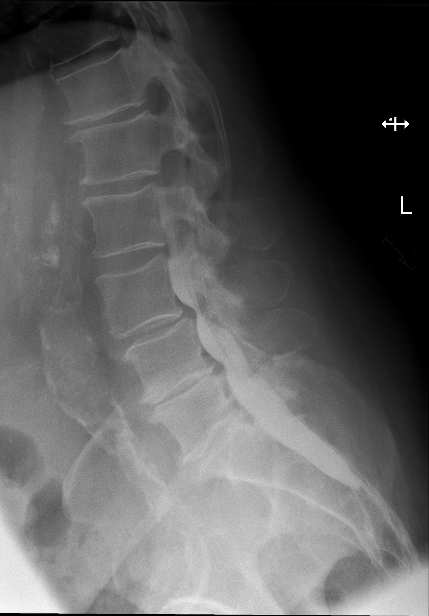

[Series 4: w lumbar spine extension · 0.15mm/px · 1 of 1 slices shown]
[im 1/1]
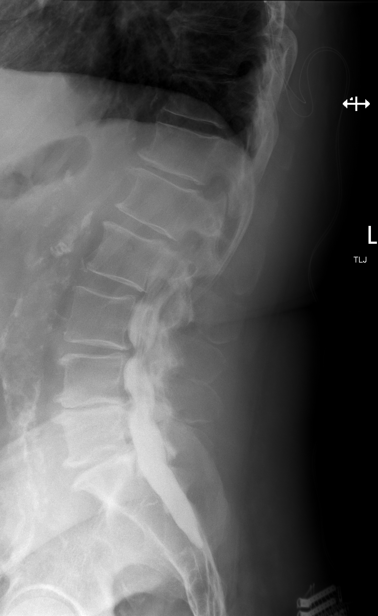

[Series 5: vasc adipose · 1 of 1 slices shown (3 of 9)]
[im 1/1]
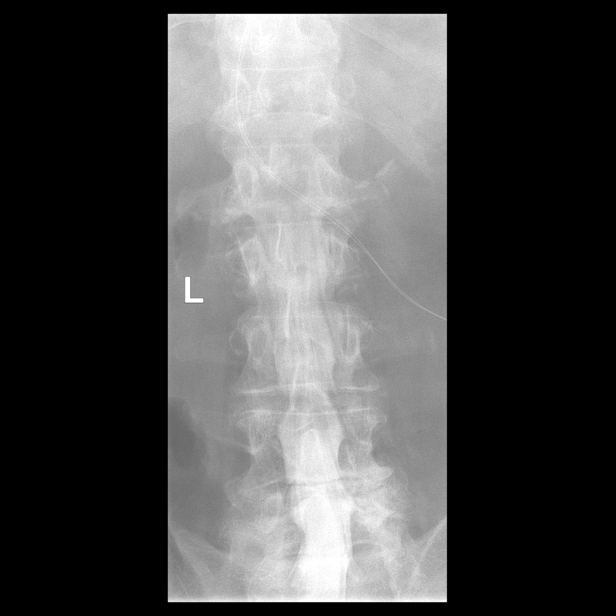

[Series 7: vasc adipose · 1 of 1 slices shown (4 of 9)]
[im 1/1]
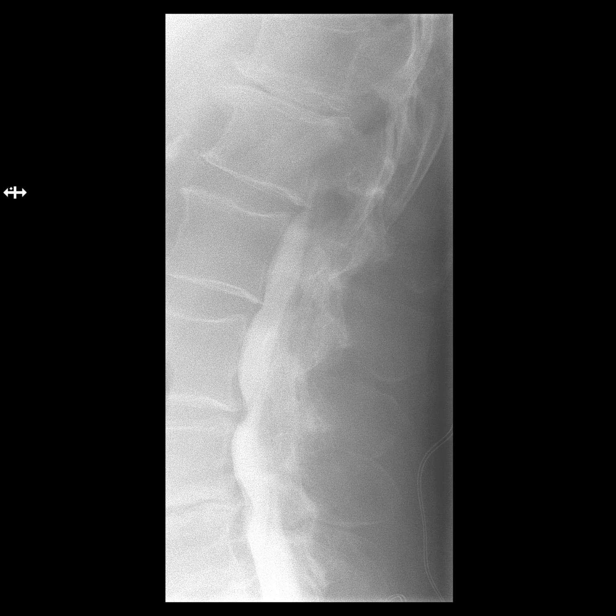

[Series 8: vasc adipose · 1 of 1 slices shown (5 of 9)]
[im 1/1]
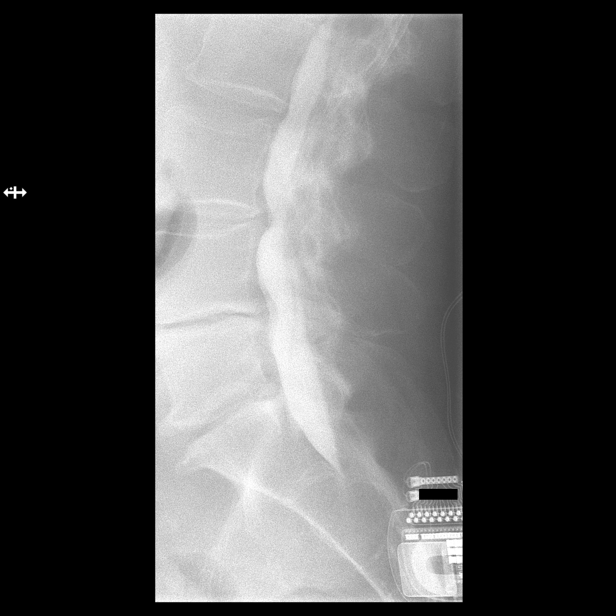

[Series 10: vasc adipose · 1 of 1 slices shown (6 of 9)]
[im 1/1]
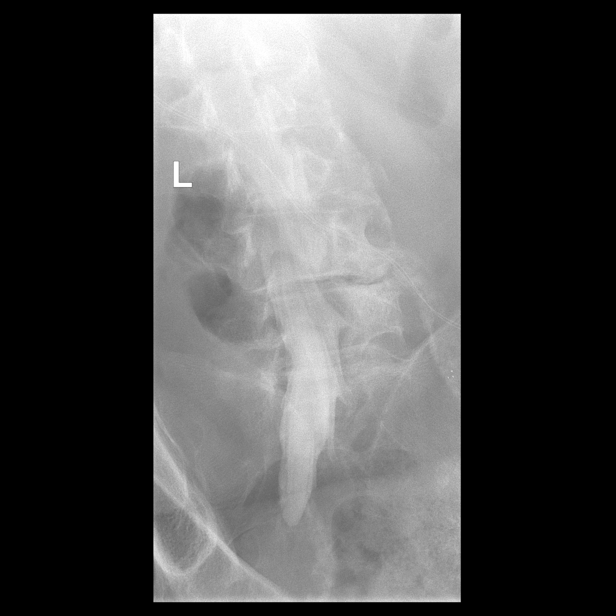

[Series 11: vasc adipose · 1 of 1 slices shown (7 of 9)]
[im 1/1]
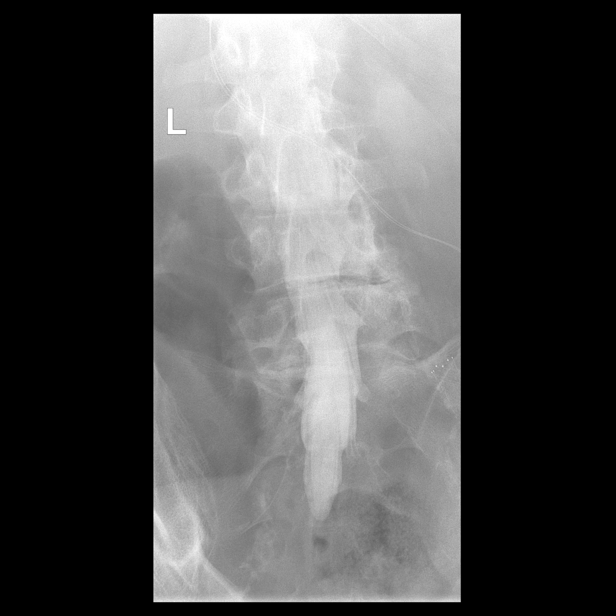

[Series 12: vasc adipose · 1 of 1 slices shown (8 of 9)]
[im 1/1]
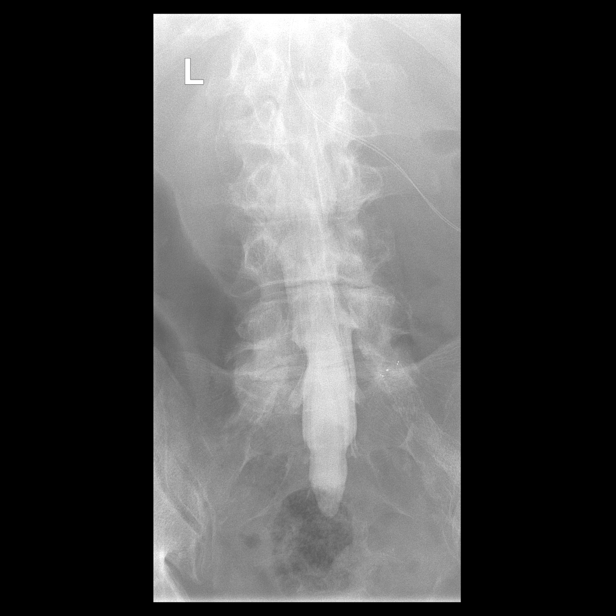

[Series 14: vasc adipose · 1 of 1 slices shown (9 of 9)]
[im 1/1]
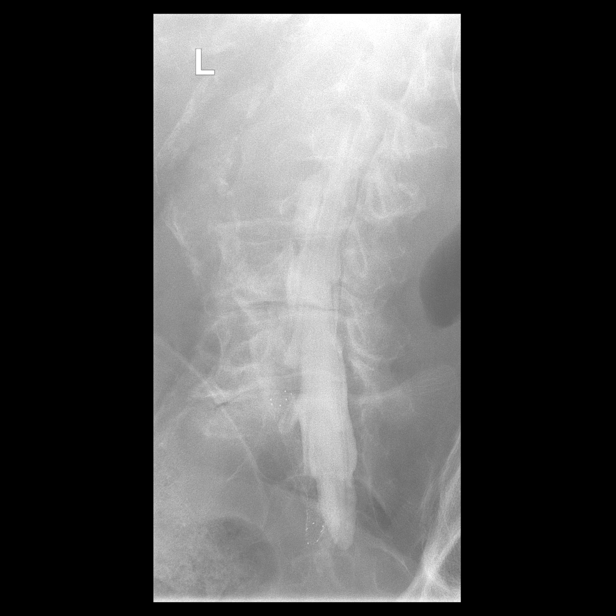

[12 of 17 positions shown; findings below may reference images not displayed]

EXAM:
LUMBAR MYELOGRAM

FLUOROSCOPY TIME:  Radiation Exposure Index (as provided by the
fluoroscopic device): 299.67 uGy*m2

Fluoroscopy Time:  24 seconds

Number of Acquired Images:  16

PROCEDURE:
After thorough discussion of risks and benefits of the procedure
including bleeding, infection, injury to nerves, blood vessels,
adjacent structures as well as headache and CSF leak, written and
oral informed consent was obtained. Consent was obtained by Dr.
Klaas Yoldanice Ordena. Time out form was completed.

Patient was positioned prone on the fluoroscopy table. Local
anesthesia was provided with 1% lidocaine without epinephrine after
prepped and draped in the usual sterile fashion. Puncture was
performed at L2-3 using a 3 1/2 inch 22-gauge spinal needle via left
paramedian approach. Using a single pass through the dura, the
needle was placed within the thecal sac, with return of clear CSF.
15 mL of Isovue Q-I00 was injected into the thecal sac, with normal
opacification of the nerve roots and cauda equina consistent with
free flow within the subarachnoid space.

I personally performed the lumbar puncture and administered the
intrathecal contrast. I also personally supervised acquisition of
the myelogram images.
FINDINGS: LUMBAR MYELOGRAM FINDINGS:

Five non rib-bearing lumbar type vertebral bodies are present.
Slight retrolisthesis is present at L1-2. There is mild subarticular
narrowing bilaterally at L1-2. Slight retrolisthesis is present at
L2-3 with minimal subarticular narrowing on the left.

A more broad-based disc protrusion is present at L3-4 with mild
subarticular narrowing bilaterally. Chronic loss of disc height is
present at L4-5. A mild broad-based disc protrusion is present as
well. No significant central canal stenosis is present.

Chronic loss of disc height is present at L5-S1 without significant
focal stenosis.

Upright images demonstrate increased disc protrusions at L3-4 and
L4-5. There is dynamic listhesis at L3-4 with anterior motion in
flexion and reduction in extension. Slight retrolisthesis at L1-2
and L2-3 is stable.

Atherosclerotic calcification is noted in the aorta.

CT LUMBAR MYELOGRAM FINDINGS:

Lumbar spine is imaged from the midbody of T12 through S2-3.
Vertebral body heights are maintained. Slight retrolisthesis is
again noted at L1-2 and L2-3. AP alignment is anatomic at L3-4,
L4-5, and L5-S1 on these supine images.

Conus medullaris terminates at L1-2, within normal limits.
Atherosclerotic calcifications are present in the aorta without
aneurysm.

Dense calcifications raise concern for stenosis in the proximal
iliac arteries bilaterally. There is significant calcification in
the proximal renal arteries, left greater than right. Dense
calcifications are present at the ostia of the celiac and superior
mesenteric arteries.

Limited imaging the abdomen is otherwise unremarkable.

L1-2: Slight retrolisthesis and leftward disc bulging is present
without significant stenosis.

L2-3: A leftward disc protrusion is present. Mild facet hypertrophy
is noted. This results in mild subarticular narrowing bilaterally.
Mild left foraminal narrowing is present.

L3-4: A broad-based disc protrusion is present. Moderate facet
hypertrophy and ligamentum flavum thickening is noted. Moderate
subarticular stenosis is slightly worse on the right. Moderate
bilateral foraminal stenosis is present.

L4-5: There is chronic loss of disc height and a broad-based
leftward disc protrusion. Mild facet hypertrophy is noted
bilaterally. Mild subarticular narrowing is present bilaterally.
Moderate right and mild left foraminal stenosis is present.

L5-S1: Facet hypertrophy is worse on the left. There is chronic loss
of disc height without a significant protrusion. No significant
central canal stenosis is present. Severe foraminal narrowing is
secondary to endplate spurring and facet hypertrophy bilaterally.
Foraminal narrowing is worse on the left.
IMPRESSION: 1. Moderate subarticular stenosis bilaterally at L3-4 is worse on
the right. This is secondary to a broad-based disc protrusion and
moderate bilateral facet hypertrophy.
2. Moderate bilateral foraminal stenosis at L3-4.
3. Mild subarticular narrowing bilaterally with moderate right and
mild left foraminal stenosis at L4-5.
4. Endplate spurring and facet hypertrophy contribute to severe
foraminal stenosis bilaterally at L5-S1, likely impacting the L5
nerve roots, worse on the left.
5. Mild subarticular and left foraminal narrowing at L2-3.
6.  Aortic Atherosclerosis ([id number]).
7. Dense atherosclerotic calcifications may contribute to stenoses
involving the proximal celiac artery, proximal superior mesenteric
artery, bilateral renal arteries, and proximal iliac arteries. The
patient's bilateral lower extremity weakness could be vascular in
nature.

## 2019-12-09 DIAGNOSIS — M545 Low back pain: Secondary | ICD-10-CM | POA: Diagnosis not present

## 2019-12-14 DIAGNOSIS — E1159 Type 2 diabetes mellitus with other circulatory complications: Secondary | ICD-10-CM | POA: Diagnosis not present

## 2019-12-14 DIAGNOSIS — J449 Chronic obstructive pulmonary disease, unspecified: Secondary | ICD-10-CM | POA: Diagnosis not present

## 2019-12-14 DIAGNOSIS — G4700 Insomnia, unspecified: Secondary | ICD-10-CM | POA: Diagnosis not present

## 2019-12-14 DIAGNOSIS — I2510 Atherosclerotic heart disease of native coronary artery without angina pectoris: Secondary | ICD-10-CM | POA: Diagnosis not present

## 2019-12-14 DIAGNOSIS — E1121 Type 2 diabetes mellitus with diabetic nephropathy: Secondary | ICD-10-CM | POA: Diagnosis not present

## 2019-12-14 DIAGNOSIS — F325 Major depressive disorder, single episode, in full remission: Secondary | ICD-10-CM | POA: Diagnosis not present

## 2019-12-14 DIAGNOSIS — I10 Essential (primary) hypertension: Secondary | ICD-10-CM | POA: Diagnosis not present

## 2019-12-14 DIAGNOSIS — J441 Chronic obstructive pulmonary disease with (acute) exacerbation: Secondary | ICD-10-CM | POA: Diagnosis not present

## 2019-12-14 DIAGNOSIS — E785 Hyperlipidemia, unspecified: Secondary | ICD-10-CM | POA: Diagnosis not present

## 2019-12-21 ENCOUNTER — Telehealth: Payer: Self-pay | Admitting: *Deleted

## 2019-12-21 NOTE — Telephone Encounter (Signed)
   [facility] Pre-operative Risk Assessment    Request for surgical clearance:  1. What type of surgery is being performed? ESI injection   2. When is this surgery scheduled? 12/29/19   3. What type of clearance is required (medical clearance vs. Pharmacy clearance to hold med vs. Both)? Both  4. Are there any medications that need to be held prior to surgery and how long? Plavix for 7 days   5. Practice name and name of physician performing surgery? [facility], [facility]   6. What is your office phone number [phone]    7.   What is your office fax number [phone]  8.   Anesthesia type (None, local, MAC, general) ? NA   Albert Powell 12/21/2019, 5:53 PM  _________________________________________________________________   (provider comments below)

## 2019-12-22 NOTE — Telephone Encounter (Signed)
Left VM

## 2019-12-22 NOTE — Telephone Encounter (Signed)
OK to hold anti platelet agents

## 2019-12-22 NOTE — Telephone Encounter (Signed)
Drs. Allyson Sabal and [name] -  We have received request to hold plavix in this patient with a history of multiple PV interventions by Dr. Allyson Sabal (2015) and Dr. [name] (2019).   OK to hold plavix for 7 days for [facility]?

## 2019-12-28 DIAGNOSIS — F325 Major depressive disorder, single episode, in full remission: Secondary | ICD-10-CM | POA: Diagnosis not present

## 2019-12-28 DIAGNOSIS — J441 Chronic obstructive pulmonary disease with (acute) exacerbation: Secondary | ICD-10-CM | POA: Diagnosis not present

## 2019-12-28 DIAGNOSIS — E1159 Type 2 diabetes mellitus with other circulatory complications: Secondary | ICD-10-CM | POA: Diagnosis not present

## 2019-12-28 DIAGNOSIS — I10 Essential (primary) hypertension: Secondary | ICD-10-CM | POA: Diagnosis not present

## 2019-12-28 DIAGNOSIS — E1121 Type 2 diabetes mellitus with diabetic nephropathy: Secondary | ICD-10-CM | POA: Diagnosis not present

## 2019-12-28 DIAGNOSIS — G4700 Insomnia, unspecified: Secondary | ICD-10-CM | POA: Diagnosis not present

## 2019-12-28 DIAGNOSIS — E785 Hyperlipidemia, unspecified: Secondary | ICD-10-CM | POA: Diagnosis not present

## 2019-12-28 DIAGNOSIS — J449 Chronic obstructive pulmonary disease, unspecified: Secondary | ICD-10-CM | POA: Diagnosis not present

## 2019-12-28 DIAGNOSIS — I2510 Atherosclerotic heart disease of native coronary artery without angina pectoris: Secondary | ICD-10-CM | POA: Diagnosis not present

## 2019-12-28 NOTE — Telephone Encounter (Signed)
   Primary Cardiologist: Nanetta Batty, MD  Chart reviewed as part of pre-operative protocol coverage. Patient was contacted 12/28/2019 in reference to pre-operative risk assessment for pending surgery as outlined below.  Albert Powell was last seen on 03/31/2019 by Dr. Allyson Sabal.  Since that day, Albert Powell has done fine from a cardiac standpoint. Up until a few months ago he was active, though now the back pain has been quite debilitating. He is unable to complete 4 METs, though has no complaints or chest pain or SOB since his last visit.   Given low risk procedure, the patient would be at acceptable risk for the planned procedure without further cardiovascular testing.   Per Dr. Allyson Sabal, patient can hold plavix 7 days prior to his upcoming procedure. He reports he has been holding plavix for the past 7 days in anticipation of his procedure tomorrow.   I will route this recommendation to the requesting party via Epic fax function and remove from pre-op pool.  Please call with questions.  Beatriz Stallion, PA-C 12/28/2019, 2:49 PM

## 2019-12-29 DIAGNOSIS — M5136 Other intervertebral disc degeneration, lumbar region: Secondary | ICD-10-CM | POA: Diagnosis not present

## 2020-01-10 DIAGNOSIS — M5136 Other intervertebral disc degeneration, lumbar region: Secondary | ICD-10-CM | POA: Diagnosis not present

## 2020-02-16 DIAGNOSIS — E1159 Type 2 diabetes mellitus with other circulatory complications: Secondary | ICD-10-CM | POA: Diagnosis not present

## 2020-02-16 DIAGNOSIS — G4700 Insomnia, unspecified: Secondary | ICD-10-CM | POA: Diagnosis not present

## 2020-02-16 DIAGNOSIS — J441 Chronic obstructive pulmonary disease with (acute) exacerbation: Secondary | ICD-10-CM | POA: Diagnosis not present

## 2020-02-16 DIAGNOSIS — I2510 Atherosclerotic heart disease of native coronary artery without angina pectoris: Secondary | ICD-10-CM | POA: Diagnosis not present

## 2020-02-16 DIAGNOSIS — E1121 Type 2 diabetes mellitus with diabetic nephropathy: Secondary | ICD-10-CM | POA: Diagnosis not present

## 2020-02-16 DIAGNOSIS — J449 Chronic obstructive pulmonary disease, unspecified: Secondary | ICD-10-CM | POA: Diagnosis not present

## 2020-02-16 DIAGNOSIS — E1359 Other specified diabetes mellitus with other circulatory complications: Secondary | ICD-10-CM | POA: Diagnosis not present

## 2020-02-16 DIAGNOSIS — E785 Hyperlipidemia, unspecified: Secondary | ICD-10-CM | POA: Diagnosis not present

## 2020-02-16 DIAGNOSIS — I10 Essential (primary) hypertension: Secondary | ICD-10-CM | POA: Diagnosis not present

## 2020-02-16 DIAGNOSIS — F325 Major depressive disorder, single episode, in full remission: Secondary | ICD-10-CM | POA: Diagnosis not present

## 2020-03-30 ENCOUNTER — Other Ambulatory Visit: Payer: Self-pay

## 2020-03-30 ENCOUNTER — Inpatient Hospital Stay (HOSPITAL_COMMUNITY)
Admission: EM | Admit: 2020-03-30 | Discharge: 2020-04-03 | DRG: 287 | Disposition: A | Discharge status: Home / Self Care | Payer: Medicare Other | Attending: Internal Medicine | Admitting: Internal Medicine

## 2020-03-30 ENCOUNTER — Inpatient Hospital Stay (HOSPITAL_COMMUNITY): Payer: Medicare Other

## 2020-03-30 ENCOUNTER — Emergency Department (HOSPITAL_COMMUNITY): Payer: Medicare Other

## 2020-03-30 ENCOUNTER — Encounter (HOSPITAL_COMMUNITY): Payer: Self-pay

## 2020-03-30 ENCOUNTER — Observation Stay (HOSPITAL_COMMUNITY): Payer: Medicare Other

## 2020-03-30 DIAGNOSIS — E785 Hyperlipidemia, unspecified: Secondary | ICD-10-CM | POA: Diagnosis not present

## 2020-03-30 DIAGNOSIS — Z20822 Contact with and (suspected) exposure to covid-19: Secondary | ICD-10-CM | POA: Diagnosis present

## 2020-03-30 DIAGNOSIS — F17210 Nicotine dependence, cigarettes, uncomplicated: Secondary | ICD-10-CM | POA: Diagnosis not present

## 2020-03-30 DIAGNOSIS — I208 Other forms of angina pectoris: Secondary | ICD-10-CM | POA: Diagnosis not present

## 2020-03-30 DIAGNOSIS — K219 Gastro-esophageal reflux disease without esophagitis: Secondary | ICD-10-CM | POA: Diagnosis not present

## 2020-03-30 DIAGNOSIS — N289 Disorder of kidney and ureter, unspecified: Secondary | ICD-10-CM

## 2020-03-30 DIAGNOSIS — I10 Essential (primary) hypertension: Secondary | ICD-10-CM | POA: Diagnosis present

## 2020-03-30 DIAGNOSIS — I480 Paroxysmal atrial fibrillation: Principal | ICD-10-CM | POA: Diagnosis present

## 2020-03-30 DIAGNOSIS — Z720 Tobacco use: Secondary | ICD-10-CM | POA: Diagnosis not present

## 2020-03-30 DIAGNOSIS — Z833 Family history of diabetes mellitus: Secondary | ICD-10-CM | POA: Diagnosis not present

## 2020-03-30 DIAGNOSIS — G319 Degenerative disease of nervous system, unspecified: Secondary | ICD-10-CM | POA: Diagnosis not present

## 2020-03-30 DIAGNOSIS — Z9049 Acquired absence of other specified parts of digestive tract: Secondary | ICD-10-CM | POA: Diagnosis not present

## 2020-03-30 DIAGNOSIS — G459 Transient cerebral ischemic attack, unspecified: Secondary | ICD-10-CM | POA: Diagnosis not present

## 2020-03-30 DIAGNOSIS — G4733 Obstructive sleep apnea (adult) (pediatric): Secondary | ICD-10-CM | POA: Diagnosis present

## 2020-03-30 DIAGNOSIS — R079 Chest pain, unspecified: Secondary | ICD-10-CM | POA: Diagnosis not present

## 2020-03-30 DIAGNOSIS — E669 Obesity, unspecified: Secondary | ICD-10-CM | POA: Diagnosis not present

## 2020-03-30 DIAGNOSIS — N1832 Chronic kidney disease, stage 3b: Secondary | ICD-10-CM

## 2020-03-30 DIAGNOSIS — Z825 Family history of asthma and other chronic lower respiratory diseases: Secondary | ICD-10-CM

## 2020-03-30 DIAGNOSIS — Z79899 Other long term (current) drug therapy: Secondary | ICD-10-CM | POA: Diagnosis not present

## 2020-03-30 DIAGNOSIS — E876 Hypokalemia: Secondary | ICD-10-CM | POA: Diagnosis not present

## 2020-03-30 DIAGNOSIS — Z7902 Long term (current) use of antithrombotics/antiplatelets: Secondary | ICD-10-CM

## 2020-03-30 DIAGNOSIS — I6381 Other cerebral infarction due to occlusion or stenosis of small artery: Secondary | ICD-10-CM | POA: Diagnosis not present

## 2020-03-30 DIAGNOSIS — I160 Hypertensive urgency: Secondary | ICD-10-CM | POA: Diagnosis not present

## 2020-03-30 DIAGNOSIS — Z8249 Family history of ischemic heart disease and other diseases of the circulatory system: Secondary | ICD-10-CM | POA: Diagnosis not present

## 2020-03-30 DIAGNOSIS — I4891 Unspecified atrial fibrillation: Secondary | ICD-10-CM

## 2020-03-30 DIAGNOSIS — E782 Mixed hyperlipidemia: Secondary | ICD-10-CM | POA: Diagnosis present

## 2020-03-30 DIAGNOSIS — I7090 Unspecified atherosclerosis: Secondary | ICD-10-CM | POA: Diagnosis not present

## 2020-03-30 DIAGNOSIS — Z955 Presence of coronary angioplasty implant and graft: Secondary | ICD-10-CM

## 2020-03-30 DIAGNOSIS — I129 Hypertensive chronic kidney disease with stage 1 through stage 4 chronic kidney disease, or unspecified chronic kidney disease: Secondary | ICD-10-CM | POA: Diagnosis not present

## 2020-03-30 DIAGNOSIS — I249 Acute ischemic heart disease, unspecified: Secondary | ICD-10-CM

## 2020-03-30 DIAGNOSIS — I2510 Atherosclerotic heart disease of native coronary artery without angina pectoris: Secondary | ICD-10-CM | POA: Diagnosis present

## 2020-03-30 DIAGNOSIS — J90 Pleural effusion, not elsewhere classified: Secondary | ICD-10-CM | POA: Diagnosis not present

## 2020-03-30 DIAGNOSIS — I739 Peripheral vascular disease, unspecified: Secondary | ICD-10-CM | POA: Diagnosis present

## 2020-03-30 DIAGNOSIS — Z888 Allergy status to other drugs, medicaments and biological substances status: Secondary | ICD-10-CM

## 2020-03-30 DIAGNOSIS — R0789 Other chest pain: Secondary | ICD-10-CM | POA: Diagnosis not present

## 2020-03-30 DIAGNOSIS — E1151 Type 2 diabetes mellitus with diabetic peripheral angiopathy without gangrene: Secondary | ICD-10-CM | POA: Diagnosis present

## 2020-03-30 DIAGNOSIS — N179 Acute kidney failure, unspecified: Secondary | ICD-10-CM | POA: Diagnosis present

## 2020-03-30 DIAGNOSIS — J449 Chronic obstructive pulmonary disease, unspecified: Secondary | ICD-10-CM | POA: Diagnosis present

## 2020-03-30 DIAGNOSIS — R000 Tachycardia, unspecified: Secondary | ICD-10-CM | POA: Diagnosis not present

## 2020-03-30 DIAGNOSIS — I25110 Atherosclerotic heart disease of native coronary artery with unstable angina pectoris: Secondary | ICD-10-CM | POA: Diagnosis not present

## 2020-03-30 DIAGNOSIS — N183 Chronic kidney disease, stage 3 unspecified: Secondary | ICD-10-CM | POA: Diagnosis not present

## 2020-03-30 DIAGNOSIS — R072 Precordial pain: Secondary | ICD-10-CM | POA: Diagnosis not present

## 2020-03-30 DIAGNOSIS — Z91013 Allergy to seafood: Secondary | ICD-10-CM

## 2020-03-30 DIAGNOSIS — E119 Type 2 diabetes mellitus without complications: Secondary | ICD-10-CM

## 2020-03-30 DIAGNOSIS — Z7984 Long term (current) use of oral hypoglycemic drugs: Secondary | ICD-10-CM

## 2020-03-30 DIAGNOSIS — Z6834 Body mass index (BMI) 34.0-34.9, adult: Secondary | ICD-10-CM

## 2020-03-30 DIAGNOSIS — Z794 Long term (current) use of insulin: Secondary | ICD-10-CM | POA: Diagnosis not present

## 2020-03-30 HISTORY — DX: Unspecified atrial fibrillation: I48.91

## 2020-03-30 LAB — LIPID PANEL
Cholesterol: 122 mg/dL (ref 0–200)
HDL: 53 mg/dL (ref >40)
LDL Cholesterol: 52 mg/dL (ref 0–99)
Total CHOL/HDL Ratio: 2.3 RATIO
Triglycerides: 85 mg/dL (ref <150)
VLDL: 17 mg/dL (ref 0–40)

## 2020-03-30 LAB — CBC WITH DIFFERENTIAL/PLATELET
Abs Immature Granulocytes: 0.11 10*3/uL — ABNORMAL HIGH (ref 0.00–0.07)
Basophils Absolute: 0 10*3/uL (ref 0.0–0.1)
Basophils Relative: 0 %
Eosinophils Absolute: 0.1 10*3/uL (ref 0.0–0.5)
Eosinophils Relative: 0 %
HCT: 44.7 % (ref 39.0–52.0)
Hemoglobin: 14.8 g/dL (ref 13.0–17.0)
Immature Granulocytes: 1 %
Lymphocytes Relative: 9 %
Lymphs Abs: 1.1 10*3/uL (ref 0.7–4.0)
MCH: 29.6 pg (ref 26.0–34.0)
MCHC: 33.1 g/dL (ref 30.0–36.0)
MCV: 89.4 fL (ref 80.0–100.0)
Monocytes Absolute: 0.7 10*3/uL (ref 0.1–1.0)
Monocytes Relative: 6 %
Neutro Abs: 10.2 10*3/uL — ABNORMAL HIGH (ref 1.7–7.7)
Neutrophils Relative %: 84 %
Platelets: 252 10*3/uL (ref 150–400)
RBC: 5 MIL/uL (ref 4.22–5.81)
RDW: 13.5 % (ref 11.5–15.5)
WBC: 12.3 10*3/uL — ABNORMAL HIGH (ref 4.0–10.5)
nRBC: 0 % (ref 0.0–0.2)

## 2020-03-30 LAB — COMPREHENSIVE METABOLIC PANEL
ALT: 35 U/L (ref 0–44)
AST: 25 U/L (ref 15–41)
Albumin: 4 g/dL (ref 3.5–5.0)
Alkaline Phosphatase: 76 U/L (ref 38–126)
Anion gap: 17 — ABNORMAL HIGH (ref 5–15)
BUN: 16 mg/dL (ref 8–23)
CO2: 23 mmol/L (ref 22–32)
Calcium: 9.6 mg/dL (ref 8.9–10.3)
Chloride: 97 mmol/L — ABNORMAL LOW (ref 98–111)
Creatinine, Ser: 1.51 mg/dL — ABNORMAL HIGH (ref 0.61–1.24)
GFR calc Af Amer: 51 mL/min — ABNORMAL LOW (ref >60)
GFR calc non Af Amer: 44 mL/min — ABNORMAL LOW (ref >60)
Glucose, Bld: 179 mg/dL — ABNORMAL HIGH (ref 70–99)
Potassium: 3.8 mmol/L (ref 3.5–5.1)
Sodium: 137 mmol/L (ref 135–145)
Total Bilirubin: 0.9 mg/dL (ref 0.3–1.2)
Total Protein: 7.1 g/dL (ref 6.5–8.1)

## 2020-03-30 LAB — ECHOCARDIOGRAM COMPLETE
Area-P 1/2: 2.73 cm2
Height: 69 in
S' Lateral: 3.6 cm
Weight: 3760 oz

## 2020-03-30 LAB — PROTIME-INR
INR: 1 (ref 0.8–1.2)
Prothrombin Time: 12.6 seconds (ref 11.4–15.2)

## 2020-03-30 LAB — GLUCOSE, CAPILLARY
Glucose-Capillary: 128 mg/dL — ABNORMAL HIGH (ref 70–99)
Glucose-Capillary: 168 mg/dL — ABNORMAL HIGH (ref 70–99)

## 2020-03-30 LAB — LIPASE, BLOOD: Lipase: 41 U/L (ref 11–51)

## 2020-03-30 LAB — TSH: TSH: 3.557 u[IU]/mL (ref 0.350–4.500)

## 2020-03-30 LAB — CBG MONITORING, ED: Glucose-Capillary: 126 mg/dL — ABNORMAL HIGH (ref 70–99)

## 2020-03-30 LAB — HEMOGLOBIN A1C
Hgb A1c MFr Bld: 6.7 % — ABNORMAL HIGH (ref 4.8–5.6)
Mean Plasma Glucose: 145.59 mg/dL

## 2020-03-30 LAB — HEPARIN LEVEL (UNFRACTIONATED): Heparin Unfractionated: 0.91 IU/mL — ABNORMAL HIGH (ref 0.30–0.70)

## 2020-03-30 LAB — TROPONIN I (HIGH SENSITIVITY)
Troponin I (High Sensitivity): 17 ng/L (ref <18)
Troponin I (High Sensitivity): 17 ng/L (ref <18)

## 2020-03-30 LAB — BRAIN NATRIURETIC PEPTIDE: B Natriuretic Peptide: 199.3 pg/mL — ABNORMAL HIGH (ref 0.0–100.0)

## 2020-03-30 LAB — SARS CORONAVIRUS 2 BY RT PCR (HOSPITAL ORDER, PERFORMED IN ~~LOC~~ HOSPITAL LAB): SARS Coronavirus 2: NEGATIVE

## 2020-03-30 MED ORDER — MOMETASONE FURO-FORMOTEROL FUM 100-5 MCG/ACT IN AERO
2.0000 | INHALATION_SPRAY | Freq: Two times a day (BID) | RESPIRATORY_TRACT | Status: DC
  Administered 2020-03-31 – 2020-04-03 (×6): 2 via RESPIRATORY_TRACT
  Filled 2020-03-30 (×2): qty 8.8

## 2020-03-30 MED ORDER — POLYVINYL ALCOHOL 1.4 % OP SOLN
1.0000 [drp] | Freq: Every day | OPHTHALMIC | Status: DC | PRN
  Filled 2020-03-30: qty 15

## 2020-03-30 MED ORDER — HYDROCODONE-ACETAMINOPHEN 5-325 MG PO TABS: 1.0000 | ORAL_TABLET | ORAL | Status: DC | PRN

## 2020-03-30 MED ORDER — PANTOPRAZOLE SODIUM 40 MG PO TBEC
40.0000 mg | DELAYED_RELEASE_TABLET | Freq: Every day | ORAL | Status: DC
  Administered 2020-03-31 – 2020-04-03 (×4): 40 mg via ORAL
  Filled 2020-03-30 (×4): qty 1

## 2020-03-30 MED ORDER — LOSARTAN POTASSIUM 50 MG PO TABS
100.0000 mg | ORAL_TABLET | Freq: Every day | ORAL | Status: DC
  Administered 2020-03-30 – 2020-03-31 (×2): 100 mg via ORAL
  Filled 2020-03-30 (×4): qty 2

## 2020-03-30 MED ORDER — ASPIRIN 81 MG PO CHEW: 324.0000 mg | CHEWABLE_TABLET | Freq: Once | ORAL | Status: DC

## 2020-03-30 MED ORDER — ISOSORBIDE MONONITRATE ER 30 MG PO TB24
60.0000 mg | ORAL_TABLET | Freq: Every day | ORAL | Status: DC
  Administered 2020-03-30 – 2020-04-02 (×4): 60 mg via ORAL
  Filled 2020-03-30 (×5): qty 2

## 2020-03-30 MED ORDER — HEPARIN (PORCINE) 25000 UT/250ML-% IV SOLN
1300.0000 [IU]/h | INTRAVENOUS | Status: DC
  Administered 2020-03-30: 1400 [IU]/h via INTRAVENOUS
  Administered 2020-03-31 – 2020-04-01 (×2): 1300 [IU]/h via INTRAVENOUS
  Filled 2020-03-30 (×3): qty 250

## 2020-03-30 MED ORDER — DILTIAZEM LOAD VIA INFUSION
20.0000 mg | Freq: Once | INTRAVENOUS | Status: AC
  Administered 2020-03-30: 20 mg via INTRAVENOUS
  Filled 2020-03-30: qty 20

## 2020-03-30 MED ORDER — ONDANSETRON HCL 4 MG/2ML IJ SOLN: 4.0000 mg | Freq: Four times a day (QID) | INTRAMUSCULAR | Status: DC | PRN

## 2020-03-30 MED ORDER — METOPROLOL TARTRATE 25 MG PO TABS
25.0000 mg | ORAL_TABLET | Freq: Two times a day (BID) | ORAL | Status: DC
  Administered 2020-03-30 – 2020-04-03 (×8): 25 mg via ORAL
  Filled 2020-03-30 (×9): qty 1

## 2020-03-30 MED ORDER — CLOPIDOGREL BISULFATE 75 MG PO TABS
75.0000 mg | ORAL_TABLET | Freq: Every day | ORAL | Status: DC
  Administered 2020-03-30 – 2020-04-03 (×6): 75 mg via ORAL
  Filled 2020-03-30 (×6): qty 1

## 2020-03-30 MED ORDER — HYDROCHLOROTHIAZIDE 25 MG PO TABS
25.0000 mg | ORAL_TABLET | Freq: Every day | ORAL | Status: DC
  Administered 2020-03-30 – 2020-03-31 (×2): 25 mg via ORAL
  Filled 2020-03-30 (×3): qty 1

## 2020-03-30 MED ORDER — DOCUSATE SODIUM 100 MG PO CAPS
100.0000 mg | ORAL_CAPSULE | Freq: Two times a day (BID) | ORAL | Status: DC
  Administered 2020-03-30 – 2020-04-03 (×7): 100 mg via ORAL
  Filled 2020-03-30 (×8): qty 1

## 2020-03-30 MED ORDER — ONDANSETRON HCL 4 MG PO TABS: 4.0000 mg | ORAL_TABLET | Freq: Four times a day (QID) | ORAL | Status: DC | PRN

## 2020-03-30 MED ORDER — INSULIN ASPART 100 UNIT/ML ~~LOC~~ SOLN: 0.0000 [IU] | Freq: Every day | SUBCUTANEOUS | Status: DC

## 2020-03-30 MED ORDER — HEPARIN BOLUS VIA INFUSION
5500.0000 [IU] | Freq: Once | INTRAVENOUS | Status: AC
  Administered 2020-03-30: 5500 [IU] via INTRAVENOUS
  Filled 2020-03-30: qty 5500

## 2020-03-30 MED ORDER — ZOLPIDEM TARTRATE 5 MG PO TABS: 5.0000 mg | ORAL_TABLET | Freq: Every evening | ORAL | Status: DC | PRN

## 2020-03-30 MED ORDER — METOPROLOL TARTRATE 5 MG/5ML IV SOLN
5.0000 mg | INTRAVENOUS | Status: DC | PRN
Start: 2020-03-30 — End: 2020-04-03

## 2020-03-30 MED ORDER — NICOTINE 14 MG/24HR TD PT24
14.0000 mg | MEDICATED_PATCH | Freq: Every day | TRANSDERMAL | Status: DC
  Administered 2020-03-30 – 2020-04-03 (×5): 14 mg via TRANSDERMAL
  Filled 2020-03-30 (×5): qty 1

## 2020-03-30 MED ORDER — BISACODYL 5 MG PO TBEC: 5.0000 mg | DELAYED_RELEASE_TABLET | Freq: Every day | ORAL | Status: DC | PRN

## 2020-03-30 MED ORDER — ACETAMINOPHEN 650 MG RE SUPP: 650.0000 mg | Freq: Four times a day (QID) | RECTAL | Status: DC | PRN

## 2020-03-30 MED ORDER — ACETAMINOPHEN 325 MG PO TABS: 650.0000 mg | ORAL_TABLET | Freq: Four times a day (QID) | ORAL | Status: DC | PRN

## 2020-03-30 MED ORDER — DILTIAZEM HCL-DEXTROSE 125-5 MG/125ML-% IV SOLN (PREMIX)
5.0000 mg/h | INTRAVENOUS | Status: DC
  Administered 2020-03-30: 5 mg/h via INTRAVENOUS
  Filled 2020-03-30: qty 125

## 2020-03-30 MED ORDER — NITROGLYCERIN 2 % TD OINT
1.0000 [in_us] | TOPICAL_OINTMENT | Freq: Four times a day (QID) | TRANSDERMAL | Status: DC
  Administered 2020-03-30 – 2020-04-03 (×12): 1 [in_us] via TOPICAL
  Filled 2020-03-30: qty 1
  Filled 2020-03-30: qty 30

## 2020-03-30 MED ORDER — MORPHINE SULFATE (PF) 2 MG/ML IV SOLN: 2.0000 mg | INTRAVENOUS | Status: DC | PRN

## 2020-03-30 MED ORDER — HYDRALAZINE HCL 20 MG/ML IJ SOLN: 5.0000 mg | INTRAMUSCULAR | Status: DC | PRN

## 2020-03-30 MED ORDER — LOSARTAN POTASSIUM-HCTZ 100-25 MG PO TABS: 1.0000 | ORAL_TABLET | Freq: Every day | ORAL | Status: DC

## 2020-03-30 MED ORDER — POLYETHYLENE GLYCOL 3350 17 G PO PACK: 17.0000 g | PACK | Freq: Every day | ORAL | Status: DC | PRN

## 2020-03-30 MED ORDER — INSULIN ASPART 100 UNIT/ML ~~LOC~~ SOLN
0.0000 [IU] | Freq: Three times a day (TID) | SUBCUTANEOUS | Status: DC
  Administered 2020-03-30 – 2020-04-01 (×5): 2 [IU] via SUBCUTANEOUS

## 2020-03-30 MED ORDER — ESCITALOPRAM OXALATE 10 MG PO TABS
20.0000 mg | ORAL_TABLET | Freq: Every day | ORAL | Status: DC
  Administered 2020-03-31 – 2020-04-03 (×4): 20 mg via ORAL
  Filled 2020-03-30 (×4): qty 2

## 2020-03-30 MED ORDER — FAMOTIDINE 20 MG PO TABS
20.0000 mg | ORAL_TABLET | ORAL | Status: DC
  Administered 2020-03-30 – 2020-04-03 (×4): 20 mg via ORAL
  Filled 2020-03-30 (×4): qty 1

## 2020-03-30 MED ORDER — ATORVASTATIN CALCIUM 40 MG PO TABS
40.0000 mg | ORAL_TABLET | Freq: Every day | ORAL | Status: DC
  Administered 2020-03-30 – 2020-04-02 (×4): 40 mg via ORAL
  Filled 2020-03-30 (×4): qty 1

## 2020-03-30 NOTE — H&P (Addendum)
History and Physical    JAVIOUS HALLISEY ZDG:[id number] DOB: 1941/03/20 DOA: 03/30/2020  PCP: [name], MD Consultants:  Ethelene Hal - orthopedics; Allyson Sabal - cardiology; [name] - vascular surgery Patient coming from: Home - lives with wife; NOK: Wife, [phone]  Chief Complaint: CP  HPI: COTEY RAKES is a 79 y.o. male with medical history significant of HTN; HLD; DM; COPD; PAD; and CAD presenting with CP.  He received a call about 830 AM that he could see his PCP today.  He took a shower and dressed and went to the car but developed n/v, sweating profusely.  He continued to vomit and sweat and developed substernal chest tightness.  He took NTG with relief.  He had severe jaw pain.  They called 911, and they said he had afib on the monitor.  No prior h/o afib.  He feels fine now, better due to NTG.    Prior to today, he wanted to see his PCP because he was worried about his aorta "calcifying" - prior xrays have indicated it was getting worse.  When he lies down, he feels like maybe he has vertigo and feels dizzy, wobbly when he stands up.  He thinks he had a mini-stroke - had severe pain in his left head and since then he has been wobbly/dizzy.  No palpitations.  No recent CP until today, has not vomited prior.      ED Course:  New-onset afib.  Has cardiac history but not afib. Went to PCP today but nauseated, weak, aching in jaw.  Took NTG with mild improvement.  EMS called and saw afib with RVR.  Given ASA, takes Plavix.  Currently pain free.  BP elevated to 200/99, HR 120-130s.  Cardiology consulted.  Given Lopressor, Heparin, NTG paste due to initial concern for ACS.  Troponin negative.   Review of Systems: As per HPI; otherwise review of systems reviewed and negative.   Ambulatory Status:  Ambulates with a cane  COVID Vaccine Status:  Complete  Past Medical History:  Diagnosis Date   Atrial fibrillation ([name]) 03/30/2020   Bradycardia, sinus 02/18/2013   CAD (coronary  artery disease)    stent to RCA 2003 also had 40% lesion at that time; NUCLEAR STRESS TEST, 01/16/2010 - normal  now with cath 80-90% stenosis in LAD, will try medical therapy if no improvement PCI   Carotid artery disease ([name])    Cataract    Claudication ([name])    LEA DUPLEX, 07/07/2008 - Normal   COPD (chronic obstructive pulmonary disease) ([name])    DDD (degenerative disc disease) 2008   Diabetes mellitus    GERD (gastroesophageal reflux disease)    H/O hiatal hernia    History of colonoscopy 2004   finding of tics and AMV only no polpys   Hyperlipidemia 02/05/2013   Hypertension    Onychomycosis    Rosacea    Tobacco abuse 02/05/2013    Past Surgical History:  Procedure Laterality Date   ABDOMINAL AORTOGRAM W/LOWER EXTREMITY N/A 03/30/2018   Procedure: ABDOMINAL AORTOGRAM W/LOWER EXTREMITY;  Surgeon: Nada Libman, MD;  Location: [facility] INVASIVE [facility];  Service: Cardiovascular;  Laterality: N/A;   APPENDECTOMY     CARDIAC CATHETERIZATION  08/29/2002   2-vessel CAD with high-grade stenoses in RCA; stenting to RCA   CARDIAC CATHETERIZATION  2014   80-90% lesion will try medical therapy   CARDIAC SURGERY     stents  2003   CARDIOVERSION N/A 11/01/2013   Procedure: PSUEDO ANUERYSM COMPRESSION;  Surgeon: [name], MD;  Location: [facility] CATH LAB;  Service: Cardiovascular;  Laterality: N/A;   CAROTID ANGIOGRAM N/A 04/25/2013   Procedure: CAROTID ANGIOGRAM;  Surgeon: [name], MD;  Location: [facility] CATH LAB;  Service: Cardiovascular;  Laterality: N/A;   CAROTID ENDARTERECTOMY     COLON RESECTION  3/04, 6/04    1 bleeding diverticulitis, 2 complete colectomy   COLON SURGERY  2005   renal pouch rectal anastimosis   CORONARY ANGIOPLASTY WITH STENT PLACEMENT  09/2002   2 stents to RCA   ENDARTERECTOMY Left 06/02/2013   Procedure: ENDARTERECTOMY CAROTID-LEFT;  Surgeon: Nada Libman, MD;  Location: [facility] OR;  Service: Vascular;  Laterality: Left;   LEFT  HEART CATHETERIZATION WITH CORONARY ANGIOGRAM N/A 02/08/2013   Procedure: LEFT HEART CATHETERIZATION WITH CORONARY ANGIOGRAM;  Surgeon: [name], MD;  Location: [facility] CATH LAB;  Service: Cardiovascular;  Laterality: N/A;   LOWER EXTREMITY ANGIOGRAM N/A 10/20/2013   Procedure: LOWER EXTREMITY ANGIOGRAM;  Surgeon: [name], MD;  Location: [facility] CATH LAB;  Service: Cardiovascular;  Laterality: N/A;   PATCH ANGIOPLASTY Left 06/02/2013   Procedure: PATCH ANGIOPLASTY;  Surgeon: Nada Libman, MD;  Location: [name] OR;  Service: Vascular;  Laterality: Left;   PERIPHERAL VASCULAR INTERVENTION Bilateral 03/30/2018   Procedure: PERIPHERAL VASCULAR INTERVENTION;  Surgeon: Nada Libman, MD;  Location: [facility] INVASIVE CV LAB;  Service: Cardiovascular;  Laterality: Bilateral;  common iliacs   TONSILLECTOMY      Social History   Socioeconomic History   Marital status: Married    Spouse name: Not on file   Number of children: Not on file   Years of education: Not on file   Highest education level: Not on file  Occupational History   Occupation: retired  Tobacco Use   Smoking status: Current Every Day Smoker    Packs/day: 0.75    Years: 57.00    Pack years: 42.75    Types: Cigarettes    Last attempt to quit: 04/06/2013    Years since quitting: 6.9   Smokeless tobacco: Former Neurosurgeon    Quit date: 04/14/2013   Tobacco comment: pt states that he is using the vapor cigs  Vaping Use   Vaping Use: Never used  Substance and Sexual Activity   Alcohol use: Yes    Alcohol/week: 1.0 standard drink    Types: 1 Glasses of wine per week    Comment: "very little"   Drug use: No   Sexual activity: Not on file  Other Topics Concern   Not on file  Social History Narrative   Not on file   Social Determinants of Health   Financial Resource Strain:    Difficulty of Paying Living Expenses:   Food Insecurity:    Worried About Programme researcher, broadcasting/film/video in the Last Year:    Barista in  the Last Year:   Transportation Needs:    Freight forwarder (Medical):    Lack of Transportation (Non-Medical):   Physical Activity:    Days of Exercise per Week:    Minutes of Exercise per Session:   Stress:    Feeling of Stress :   Social Connections:    Frequency of Communication with Friends and Family:    Frequency of Social Gatherings with Friends and Family:    Attends Religious Services:    Active Member of Clubs or Organizations:    Attends Banker Meetings:    Marital Status:  Intimate Partner Violence:    Fear of Current or Ex-Partner:    Emotionally Abused:    Physically Abused:    Sexually Abused:     Allergies  Allergen Reactions   Fentanyl Other (See Comments)    Behavioral changes   Gabapentin Other (See Comments)    ankle swells   Lisinopril Other (See Comments)    unknown   Lyrica [Pregabalin] Other (See Comments)    unknown   Metformin Diarrhea   Propofol Other (See Comments)    Heart rate dropped   Shellfish Allergy Other (See Comments)    Other Other-Mild skin reaction to shrimp    Family History  Problem Relation Age of Onset   Heart disease Father        heart attack at 69   Heart attack Father    Hyperlipidemia Father    Colonic polyp Mother        that bled out   COPD Mother    Diabetes Mother    Heart disease Sister    Colonic polyp Sister    Stroke Maternal Grandfather    Heart attack Paternal Grandfather    Other Neg Hx        hypogonadism    Prior to Admission medications   Medication Sig Start Date End Date Taking? Authorizing Provider  amLODipine (NORVASC) 10 MG tablet Take 10 mg by mouth at bedtime.     [provider]  atorvastatin (LIPITOR) 40 MG tablet Take 40 mg by mouth at bedtime.     [provider]  clopidogrel (PLAVIX) 75 MG tablet TAKE 1 TABLET (75 MG TOTAL) BY MOUTH DAILY. Patient taking differently: Take 75 mg by mouth at bedtime.  09/29/17    [name], MD  escitalopram (LEXAPRO) 20 MG tablet Take 20 mg by mouth daily. 02/14/18   [provider]  Fluticasone-Salmeterol (ADVAIR) 100-50 MCG/DOSE AEPB Inhale 1 puff into the lungs daily.     [provider]  glipiZIDE (GLUCOTROL) 5 MG tablet Take 5 mg by mouth daily at 12 noon.  02/25/17   [provider]  isosorbide mononitrate (IMDUR) 60 MG 24 hr tablet Take 1 tablet (60 mg total) by mouth daily. 02/09/13   Deretha Emory, MD  losartan-hydrochlorothiazide (HYZAAR) 100-25 MG per tablet Take 1 tablet by mouth at bedtime.     [provider]  metoprolol succinate (TOPROL-XL) 25 MG 24 hr tablet TAKE 1/2 TABLET BY MOUTH DAILY Patient taking differently: Take 12.5 mg by mouth at bedtime 02/09/14   [name], MD  pantoprazole (PROTONIX) 40 MG tablet Take 40 mg by mouth daily.    [provider]    Physical Exam: Vitals:   03/30/20 1233 03/30/20 1345 03/30/20 1630 03/30/20 1723  BP:  (!) 168/99 (!) 169/89 (!) 163/105  Pulse:  71 91 85  Resp:  16 17 20   Temp:   98 F (36.7 C) 98.7 F (37.1 C)  TempSrc:   Oral Oral  SpO2:  98% 96% 98%  Weight: 106.6 kg     Height: 5\' 9"  (1.753 m)         General:  Appears calm and comfortable and is NAD  Eyes:  PERRL, EOMI, normal lids, iris  ENT:  grossly normal hearing, lips & tongue, mmm; appropriate dentition  Neck:  no LAD, masses or thyromegaly; no carotid bruits  Cardiovascular:  Irregularly irregular with mild tachycardia, no m/r/g. No LE edema.   Respiratory:   CTA bilaterally  with no wheezes/rales/rhonchi.  Normal respiratory effort.  Abdomen:  soft, NT, ND, NABS  Skin:  no rash or induration seen on limited exam  Musculoskeletal:  grossly normal tone BUE/BLE, good ROM, no bony abnormality  Psychiatric:  grossly normal mood and affect, speech fluent and appropriate, AOx3  Neurologic:  CN 2-12 grossly intact, moves all extremities in coordinated  fashion    Radiological Exams on Admission: DG Chest Port 1 View  Result Date: 03/30/2020 CLINICAL DATA:  Chest pain. EXAM: PORTABLE CHEST 1 VIEW COMPARISON:  May 31, 2013. FINDINGS: Stable cardiomediastinal silhouette. No pneumothorax or pleural effusion is noted. No acute pulmonary disease is noted. Bony thorax is unremarkable. IMPRESSION: No active disease. Aortic Atherosclerosis (ICD10-I70.0). Electronically Signed   By: Lupita Raider M.D.   On: 03/30/2020 13:58   ECHOCARDIOGRAM COMPLETE  Result Date: 03/30/2020    ECHOCARDIOGRAM REPORT   Patient Name:   PARTH MCCORMAC Date of Exam: 03/30/2020 Medical Rec #:  [id number]        Height:       69.0 in Accession #:    [id number]       Weight:       235.0 lb Date of Birth:  11-Mar-1941        BSA:          2.213 m Patient Age:    79 years         BP:           156/95 mmHg Patient Gender: M                HR:           105 bpm. Exam Location:  Inpatient Procedure: 2D Echo, Cardiac Doppler and Color Doppler Indications:    R07.9* Chest pain, unspecified  History:        Patient has no prior history of Echocardiogram examinations.                 CAD, Carotid Disease and COPD, Arrythmias:Atrial Fibrillation;                 Risk Factors:Hypertension, Diabetes, Dyslipidemia and Former                 Smoker.  Sonographer:    Elmarie Shiley Dance Referring Phys: [phone] JILL D MCDANIEL IMPRESSIONS  1. Left ventricular ejection fraction, by estimation, is 55 to 60%. The left ventricle has normal function. The left ventricle has no regional wall motion abnormalities. Left ventricular diastolic parameters are consistent with Grade I diastolic dysfunction (impaired relaxation).  2. Right ventricular systolic function is normal. The right ventricular size is normal.  3. The mitral valve is normal in structure. No evidence of mitral valve regurgitation. No evidence of mitral stenosis.  4. The aortic valve is tricuspid. Aortic valve regurgitation is not visualized. Mild  aortic valve sclerosis is present, with no evidence of aortic valve stenosis.  5. The inferior vena cava is normal in size with greater than 50% respiratory variability, suggesting right atrial pressure of 3 mmHg. FINDINGS  Left Ventricle: Left ventricular ejection fraction, by estimation, is 55 to 60%. The left ventricle has normal function. The left ventricle has no regional wall motion abnormalities. The left ventricular internal cavity size was normal in size. There is  no left ventricular hypertrophy. Left ventricular diastolic parameters are consistent with Grade I diastolic dysfunction (impaired relaxation). Right Ventricle: The right ventricular size is normal. No increase in right ventricular wall thickness.  Right ventricular systolic function is normal. Left Atrium: Left atrial size was normal in size. Right Atrium: Right atrial size was normal in size. Pericardium: There is no evidence of pericardial effusion. Mitral Valve: The mitral valve is normal in structure. There is mild calcification of the mitral valve leaflet(s). Normal mobility of the mitral valve leaflets. Mild mitral annular calcification. No evidence of mitral valve regurgitation. No evidence of mitral valve stenosis. Tricuspid Valve: The tricuspid valve is normal in structure. Tricuspid valve regurgitation is not demonstrated. No evidence of tricuspid stenosis. Aortic Valve: The aortic valve is tricuspid. Aortic valve regurgitation is not visualized. Mild aortic valve sclerosis is present, with no evidence of aortic valve stenosis. Pulmonic Valve: The pulmonic valve was normal in structure. Pulmonic valve regurgitation is not visualized. No evidence of pulmonic stenosis. Aorta: The aortic root is normal in size and structure. Venous: The inferior vena cava is normal in size with greater than 50% respiratory variability, suggesting right atrial pressure of 3 mmHg. IAS/Shunts: No atrial level shunt detected by color flow Doppler.  LEFT  VENTRICLE PLAX 2D LVIDd:         4.20 cm LVIDs:         3.60 cm LV PW:         1.00 cm LV IVS:        1.00 cm LVOT diam:     2.30 cm LV SV:         69 LV SV Index:   31 LVOT Area:     4.15 cm  RIGHT VENTRICLE          IVC RV Basal diam:  2.00 cm  IVC diam: 1.30 cm TAPSE (M-mode): 1.5 cm LEFT ATRIUM             Index       RIGHT ATRIUM           Index LA diam:        3.60 cm 1.63 cm/m  RA Area:     15.40 cm LA Vol (A2C):   63.4 ml 28.65 ml/m RA Volume:   29.90 ml  13.51 ml/m LA Vol (A4C):   35.9 ml 16.23 ml/m LA Biplane Vol: 50.7 ml 22.91 ml/m  AORTIC VALVE LVOT Vmax:   96.30 cm/s LVOT Vmean:  59.800 cm/s LVOT VTI:    0.167 m  AORTA Ao Root diam: 3.70 cm Ao Asc diam:  3.30 cm MITRAL VALVE MV Area (PHT): 2.73 cm     SHUNTS MV Decel Time: 278 msec     Systemic VTI:  0.17 m MV E velocity: 71.60 cm/s   Systemic Diam: 2.30 cm MV A velocity: 110.00 cm/s MV E/A ratio:  0.65 Donato Schultz MD Electronically signed by Donato Schultz MD Signature Date/Time: 03/30/2020/4:31:02 PM    Final     EKG: Independently reviewed.  Afib with rate 118; LAFB, low voltage, no evidence of acute ischemia   Labs on Admission: I have personally reviewed the available labs and imaging studies at the time of the admission.  Pertinent labs:   Glucose 179 BUN 16/Creatinine 1.51/GFR 44 - stable Anion gap 17 BNP 199.3 HS troponin 17 WBC 12.3 INR 1.0   Assessment/Plan Principal Problem:   New onset atrial fibrillation [facility]) Active Problems:   DM (diabetes mellitus) ([name])   CAD hx of 2 stents to RCA in 2003,  80-90% stenosis of LAD/CFX June 2014- medical Rx   Hyperlipidemia   Tobacco abuse   Obesity (BMI 30-39.9)- negative  sleep study in the past   Claudication of right lower extremity ([name])   Benign essential hypertension    Atrial Fibrillation -Patient presenting with new-onset afib.  -Etiology is thought to be related to HTN, ischemic heart disease, or unknown.  -Since the afib onset is unknown, will focus on rate  control and searching for the underlying cause at this time. -Will admit to SDU for Diltiazem drip as per protocol with plan to transition to PO Diltiazem once heart rate is controlled; patient needs admission based on afib associated with a high-risk situation (known significant CAD, need for cath Monday). -HS troponin negative x 2 -Continue Nitropaste -Will request Echocardiogram for further evaluation  -Will risk stratify with FLP and HgbA1c; will also order TSH -Repeat EKG in AM or when he spontaneously converts -Will consult cardiology . -CHA2DS2-VASc Score is >2 and so patient will need oral anticoagulation; TOC team consult to discuss cost options based on insurance.  -Heparin for now pending cath -Cardiology has ordered head CT to assess for TIA given symptoms, although symptoms may all be related to PAF  CAD/PAD -Continue Plavix, Imdur -Prior h/o significant CAD with med management, but cardiology plans to cath on Monday to assess for progression -Needs smoking cessation  HTN -Hold Norvasc -Continue Hyzaar and Toprol as per cardiology -Dilt drip for now -Will also add prn hydralazine  HLD -Continue Lipitor -Check lipids  DM -Glucose 179 -No recent A1c, will check -Hold Glucotrol, Ozempic -Will cover with moderate-scale SSI for now  Tobacco dependence with COPD -Encourage cessation - while he is no longer smoking cigarettes, he continues vaping which remains detrimental to his health. -This was discussed with the patient and should be reviewed on an ongoing basis.   -Patch declined -Continue Advair [facility] formulary substitution)  Obesity Body mass index is 34.7 kg/m. -Weight loss should be encouraged -Outpatient PCP/bariatric medicine f/u encouraged   Note: This patient has been tested and is pending for the novel coronavirus COVID-19.   DVT prophylaxis: Heparin Code Status:  Full - confirmed with patient/family Family Communication: Wife was present  throughout evaluation Disposition Plan:  The patient is from: home  Anticipated d/c is to: home without [facility] services  Anticipated d/c date will depend on clinical response to treatment, but likely early to mid-next week depending on cath results  Patient is currently: acutely ill Consults called: Cardiology; [facility] team  Admission status: Admit - It is my clinical opinion that admission to INPATIENT is reasonable and necessary because of the expectation that this patient will require hospital care that crosses at least 2 midnights to treat this condition based on the medical complexity of the problems presented.  Given the aforementioned information, the predictability of an adverse outcome is felt to be significant.      Jonah Blue MD [facility]   How to contact the [facility] Attending or Consulting provider 7A - 7P or covering provider during after hours 7P -7A, for this patient?  1. Check the care team in [facility] and look for a) attending/consulting [facility] provider listed and b) the [facility] team listed 2. Log into [email or web address] and use [facility] universal password to access. If you do not have the password, please contact the hospital operator. 3. Locate the [facility] provider you are looking for under [facility] and page to a number that you can be directly reached. 4. If you still have difficulty reaching the provider, please page the [facility] (Director on Call) for the Hospitalists listed on amion  for assistance.   03/30/2020, 5:35 PM

## 2020-03-30 NOTE — Progress Notes (Signed)
Pt received from ED on NSR, MD paged made aware, with order to stop Cardizem drip.  EKG done pt converted back to Afib, MD order to resume Cardizem drip. Pt  transported to CT.

## 2020-03-30 NOTE — ED Provider Notes (Signed)
[name] EMERGENCY DEPARTMENT Provider Note   CSN: [id number] Arrival date & time: 03/30/20  1227     History Chief Complaint  Patient presents with  . Atrial Fibrillation    Albert Powell is a 79 y.o. male.  HPI Patient reports that he was on his way to see his internist this morning.  He was walking out to his car and he abruptly started to feel very nauseated and a pressure pain came up on his neck and into his jaws.  Patient reports he became concerned for possible cardiac chest pain given the pain in his jaws.  Reports he also felt short of breath.  He went back in the house and took one of his wife's nitroglycerin.  He reports that did help but he continued to have discomfort and called EMS.  EMS identified patient to have atrial fibrillation.  Patient reports he has no known history of atrial fibrillation.  He takes daily Plavix but not other anticoagulants.  He denies perceiving palpitations or racing of his heart.  He reports he was well before the symptoms.  He has not been having problems with exertional shortness of breath or or chest pain although patient reports he has very limited physical activity due to mobility issues.  Patient reports he is compliant with his medications and takes his blood pressure medications in the evening.  Patient denies he has any headache or visual changes.  No focal weakness numbness or tingling of extremities.    Past Medical History:  Diagnosis Date  . Bradycardia, sinus 02/18/2013  . CAD (coronary artery disease)    stent to RCA 2003 also had 40% lesion at that time; NUCLEAR STRESS TEST, 01/16/2010 - normal  now with cath 80-90% stenosis in LAD, will try medical therapy if no improvement PCI  . Carotid artery disease ([name])   . Cataract   . Claudication ([name])    LEA DUPLEX, 07/07/2008 - Normal  . COPD (chronic obstructive pulmonary disease) ([name])   . DDD (degenerative disc disease) 2008  . Diabetes mellitus   . GERD  (gastroesophageal reflux disease)   . H/O hiatal hernia   . History of colonoscopy 2004   finding of tics and AMV only no polpys  . Hyperlipidemia 02/05/2013  . Hypertension   . Onychomycosis   . Rosacea   . Tobacco abuse 02/05/2013    Patient Active Problem List   Diagnosis Date Noted  . Contusion of right hand 11/12/2018  . Primary osteoarthritis of both first carpometacarpal joints 11/12/2018  . Pain in joint of right shoulder 10/05/2018  . Pain in right hand 10/05/2018  . Hematoma 03/30/2018  . Degeneration of lumbar intervertebral disc 12/21/2017  . Lumbar radiculopathy 12/21/2017  . Vertigo 04/09/2017  . Hypogonadism in male 12/07/2014  . Screening for prostate cancer 12/07/2014  . Claudication of right lower extremity 10/20/2013  . Claudication in peripheral vascular disease- Rt leg 09/20/2013  . Obesity (BMI 30-39.9)- negative sleep study in the past 09/20/2013  . Occlusion and stenosis of carotid artery without mention of cerebral infarction 05/23/2013  . Peripheral arterial disease ([name]) 05/18/2013  . Carotid artery disease ([name]) 04/14/2013  . Bradycardia, sinus 02/18/2013  . Hyperlipidemia 02/05/2013  . Tobacco abuse 02/05/2013  . Chest pain, unstable angina, negative MI 02/04/2013  . HTN (hypertension) 02/04/2013  . DM (diabetes mellitus) ([name]) 02/04/2013  . CAD hx of 2 stents to RCA in 2003,  80-90% stenosis of LAD/CFX June 2014- medical Rx  02/04/2013  . Benign essential hypertension 07/10/2011  . Incisional hernia 05/22/2011    Past Surgical History:  Procedure Laterality Date  . ABDOMINAL AORTOGRAM W/LOWER EXTREMITY N/A 03/30/2018   Procedure: ABDOMINAL AORTOGRAM W/LOWER EXTREMITY;  Surgeon: Nada Libman, MD;  Location: [facility] INVASIVE CV LAB;  Service: Cardiovascular;  Laterality: N/A;  . APPENDECTOMY    . CARDIAC CATHETERIZATION  08/29/2002   2-vessel CAD with high-grade stenoses in RCA; stenting to RCA  . CARDIAC CATHETERIZATION  2014   80-90% lesion  will try medical therapy  . CARDIAC SURGERY     stents  2003  . CARDIOVERSION N/A 11/01/2013   Procedure: Dimple Nanas COMPRESSION;  Surgeon: [name], MD;  Location: [facility] CATH LAB;  Service: Cardiovascular;  Laterality: N/A;  . CAROTID ANGIOGRAM N/A 04/25/2013   Procedure: CAROTID ANGIOGRAM;  Surgeon: [name], MD;  Location: [facility] CATH LAB;  Service: Cardiovascular;  Laterality: N/A;  . CAROTID ENDARTERECTOMY    . COLON RESECTION  3/04, 6/04    1 bleeding diverticulitis, 2 complete colectomy  . COLON SURGERY  2005   renal pouch rectal anastimosis  . CORONARY ANGIOPLASTY WITH STENT PLACEMENT  09/2002   2 stents to RCA  . ENDARTERECTOMY Left 06/02/2013   Procedure: ENDARTERECTOMY CAROTID-LEFT;  Surgeon: Nada Libman, MD;  Location: [facility] OR;  Service: Vascular;  Laterality: Left;  . LEFT HEART CATHETERIZATION WITH CORONARY ANGIOGRAM N/A 02/08/2013   Procedure: LEFT HEART CATHETERIZATION WITH CORONARY ANGIOGRAM;  Surgeon: [name], MD;  Location: [facility] CATH LAB;  Service: Cardiovascular;  Laterality: N/A;  . LOWER EXTREMITY ANGIOGRAM N/A 10/20/2013   Procedure: LOWER EXTREMITY ANGIOGRAM;  Surgeon: [name], MD;  Location: [facility] CATH LAB;  Service: Cardiovascular;  Laterality: N/A;  . PATCH ANGIOPLASTY Left 06/02/2013   Procedure: PATCH ANGIOPLASTY;  Surgeon: Nada Libman, MD;  Location: [facility] OR;  Service: Vascular;  Laterality: Left;  . PERIPHERAL VASCULAR INTERVENTION Bilateral 03/30/2018   Procedure: PERIPHERAL VASCULAR INTERVENTION;  Surgeon: Nada Libman, MD;  Location: [facility] INVASIVE CV LAB;  Service: Cardiovascular;  Laterality: Bilateral;  common iliacs  . TONSILLECTOMY         Family History  Problem Relation Age of Onset  . Heart disease Father        heart attack at 36  . Heart attack Father   . Hyperlipidemia Father   . Colonic polyp Mother        that bled out  . COPD Mother   . Diabetes Mother   . Heart disease Sister   . Colonic polyp Sister   .  Stroke Maternal Grandfather   . Heart attack Paternal Grandfather   . Other Neg Hx        hypogonadism    Social History   Tobacco Use  . Smoking status: Former Smoker    Packs/day: 0.75    Years: 57.00    Pack years: 42.75    Types: Cigarettes    Quit date: 04/06/2013    Years since quitting: 6.9  . Smokeless tobacco: Former Neurosurgeon    Quit date: 04/14/2013  . Tobacco comment: pt states that he is using the vapor cigs  Vaping Use  . Vaping Use: Never used  Substance Use Topics  . Alcohol use: Yes    Alcohol/week: 1.0 standard drink    Types: 1 Glasses of wine per week  . Drug use: No    Home Medications Prior to Admission medications   Medication Sig Start  Date End Date Taking? Authorizing Provider  amLODipine (NORVASC) 10 MG tablet Take 10 mg by mouth at bedtime.     [provider]  atorvastatin (LIPITOR) 40 MG tablet Take 40 mg by mouth at bedtime.     [provider]  clopidogrel (PLAVIX) 75 MG tablet TAKE 1 TABLET (75 MG TOTAL) BY MOUTH DAILY. Patient taking differently: Take 75 mg by mouth at bedtime.  09/29/17   [name], MD  escitalopram (LEXAPRO) 20 MG tablet Take 20 mg by mouth daily. 02/14/18   [provider]  Fluticasone-Salmeterol (ADVAIR) 100-50 MCG/DOSE AEPB Inhale 1 puff into the lungs daily.     [provider]  glipiZIDE (GLUCOTROL) 5 MG tablet Take 5 mg by mouth daily at 12 noon.  02/25/17   [provider]  isosorbide mononitrate (IMDUR) 60 MG 24 hr tablet Take 1 tablet (60 mg total) by mouth daily. 02/09/13   Deretha Emory, MD  losartan-hydrochlorothiazide (HYZAAR) 100-25 MG per tablet Take 1 tablet by mouth at bedtime.     [provider]  metoprolol succinate (TOPROL-XL) 25 MG 24 hr tablet TAKE 1/2 TABLET BY MOUTH DAILY Patient taking differently: Take 12.5 mg by mouth at bedtime 02/09/14   [name], MD  pantoprazole (PROTONIX) 40 MG tablet Take 40 mg by mouth daily.    [provider]    Allergies    Fentanyl, Gabapentin, Lisinopril, Lyrica [pregabalin], Metformin, Propofol, and Shellfish allergy  Review of Systems   Review of Systems 10 systems reviewed and negative except as per HPI Physical Exam Updated Vital Signs BP (!) 168/99   Pulse 71   Temp 98.7 F (37.1 C) (Oral)   Resp 16   Ht 5\' 9"  (1.753 m)   Wt 106.6 kg   SpO2 98%   BMI 34.70 kg/m   Physical Exam Constitutional:      Comments: Patient is alert and nontoxic.  No respiratory distress at rest.  Central obesity.  HENT:     Head: Normocephalic and atraumatic.     Mouth/Throat:     Pharynx: Oropharynx is clear.  Eyes:     Extraocular Movements: Extraocular movements intact.  Cardiovascular:     Comments: Tachycardia.  Irregularly irregular. Pulmonary:     Effort: Pulmonary effort is normal.     Breath sounds: Normal breath sounds.  Abdominal:     General: There is no distension.     Palpations: Abdomen is soft.     Tenderness: There is no abdominal tenderness. There is no guarding.  Musculoskeletal:     Comments: Trace edema of the left lower extremity.  No calf pain or tenderness.  Right lower extremity without any edema or swelling.  Dorsalis pedis pulses 2+ and symmetric.  Skin:    General: Skin is warm and dry.  Neurological:     General: No focal deficit present.     Mental Status: He is oriented to person, place, and time.     Cranial Nerves: No cranial nerve deficit.     Coordination: Coordination normal.  Psychiatric:        Mood and Affect: Mood normal.     ED Results / Procedures / Treatments   Labs (all labs ordered are listed, but only abnormal results are displayed) Labs Reviewed  COMPREHENSIVE METABOLIC PANEL - Abnormal; Notable for the following components:      Result Value   Chloride 97 (*)    Glucose, Bld 179 (*)  Creatinine, Ser 1.51 (*)    GFR calc non Af Amer 44 (*)    GFR calc Af Amer 51 (*)    Anion gap 17 (*)    All other components  within normal limits  BRAIN NATRIURETIC PEPTIDE - Abnormal; Notable for the following components:   B Natriuretic Peptide 199.3 (*)    All other components within normal limits  CBC WITH DIFFERENTIAL/PLATELET - Abnormal; Notable for the following components:   WBC 12.3 (*)    Neutro Abs 10.2 (*)    Abs Immature Granulocytes 0.11 (*)    All other components within normal limits  LIPASE, BLOOD  PROTIME-INR  HEPARIN LEVEL (UNFRACTIONATED)  TROPONIN I (HIGH SENSITIVITY)  TROPONIN I (HIGH SENSITIVITY)    EKG None    Radiology DG Chest Port 1 View  Result Date: 03/30/2020 CLINICAL DATA:  Chest pain. EXAM: PORTABLE CHEST 1 VIEW COMPARISON:  May 31, 2013. FINDINGS: Stable cardiomediastinal silhouette. No pneumothorax or pleural effusion is noted. No acute pulmonary disease is noted. Bony thorax is unremarkable. IMPRESSION: No active disease. Aortic Atherosclerosis (ICD10-I70.0). Electronically Signed   By: Lupita Raider M.D.   On: 03/30/2020 13:58    Procedures Procedures (including critical care time) CRITICAL CARE Performed by: Arby Barrette   Total critical care time: 30 minutes  Critical care time was exclusive of separately billable procedures and treating other patients.  Critical care was necessary to treat or prevent imminent or life-threatening deterioration.  Critical care was time spent personally by me on the following activities: development of treatment plan with patient and/or surrogate as well as nursing, discussions with consultants, evaluation of patient's response to treatment, examination of patient, obtaining history from patient or surrogate, ordering and performing treatments and interventions, ordering and review of laboratory studies, ordering and review of radiographic studies, pulse oximetry and re-evaluation of patient's condition. Medications Ordered in ED Medications  metoprolol tartrate (LOPRESSOR) injection 5 mg (has no administration in time  range)  nitroGLYCERIN (NITROGLYN) 2 % ointment 1 inch (1 inch Topical Given 03/30/20 1346)  heparin ADULT infusion 100 units/mL (25000 units/28mL sodium chloride 0.45%) (1,400 Units/hr Intravenous New Bag/Given 03/30/20 1344)  heparin bolus via infusion 5,500 Units (5,500 Units Intravenous Bolus from Bag 03/30/20 1344)    ED Course  I have reviewed the triage vital signs and the nursing notes.  Pertinent labs & imaging results that were available during my care of the patient were reviewed by me and considered in my medical decision making (see chart for details).  Patient presents with new onset atrial fibrillation\flutter.  Patient was not aware of any palpitations or perception of heart racing.  Main symptom was nausea with jaw pain and shortness of breath.  Concern for possible ACS presentation with A. fib unknown time of onset.  Patient is also significantly hypertensive.  Documented pressure of 174/96, while in the room repeat pressure 200/99.  Patient denies any chest pain or shortness of breath at rest.  Will initiate cardiac work-up and give Lopressor IV and nitroglycerin paste and aspirin.  Will reassess pressures and rate for response to treatment.  Patient takes daily Plavix, will initiate heparin and aspirin.  Will place consultation for cardiology.  Clinical Course as of Mar 31 1431  Fri Mar 30, 2020  1313 Consult: Reviewed with Rosann Auerbach for [facility].  Will do consult in the emergency department.   [MP]    Clinical Course User Index [MP] Arby Barrette, MD   Consult: Dr. Ophelia Charter  [facility] hospitalist for admission MDM Rules/Calculators/A&P                         Patient presents as outlined above.  For symptom came on abruptly with nausea jaw pain and shortness of breath.  Concern for possible ACS.  First troponin is normal.  Patient does not endorse perceiving palpitation or heart racing.  At this time do not feel that I can determine time of onset of atrial fibrillation.   Patient is pain-free and does not endorse shortness of breath at rest.  Will admit to medical service for ongoing management with consultation to cardiology.  Final Clinical Impression(s) / ED Diagnoses Final diagnoses:  Atrial fibrillation with RVR ([name])  ACS (acute coronary syndrome) [facility])    Rx / DC Orders ED Discharge Orders    None       Arby Barrette, MD 03/30/20 1434

## 2020-03-30 NOTE — ED Triage Notes (Addendum)
Pt brought to ED via EMS from home with c/o sudden onset n/v, substernal chest pressure, and jaw ache. Pt reports taking 1 of his wife's nitro pills prior to EMS arrival. Pain subsided prior to EMS arrival to home. EMS reports pt in new onset afib with a rate between 80-130, 170/98 BP, 96% on room air, 191 CBG. Currently A&O x4, denies pain, afib rhythm.

## 2020-03-30 NOTE — Progress Notes (Signed)
  Echocardiogram 2D Echocardiogram has been performed.  Albert Powell 03/30/2020, 4:22 PM

## 2020-03-30 NOTE — Progress Notes (Signed)
ANTICOAGULATION CONSULT NOTE - Follow Up Consult  Pharmacy Consult for Heparin Indication: atrial fibrillation  Allergies  Allergen Reactions  . Fentanyl Other (See Comments)    Behavioral changes  . Gabapentin Other (See Comments)    ankle swells  . Lisinopril Other (See Comments)    unknown  . Lyrica [Pregabalin] Other (See Comments)    unknown  . Metformin Diarrhea  . Propofol Other (See Comments)    Heart rate dropped  . Shellfish Allergy Other (See Comments)    Other Other-Mild skin reaction to shrimp    Patient Measurements: Height: 5\' 9"  (175.3 cm) Weight: 106.6 kg (235 lb) IBW/kg (Calculated) : 70.7 Heparin Dosing Weight: 93.8 kg  Vital Signs: Temp: 98.2 F (36.8 C) (07/16 2022) Temp Source: Oral (07/16 2022) BP: 136/72 (07/16 2022) Pulse Rate: 63 (07/16 2022)  Labs: Recent Labs    03/30/20 1230 03/30/20 1524 03/30/20 2005  HGB 14.8  --   --   HCT 44.7  --   --   PLT 252  --   --   LABPROT 12.6  --   --   INR 1.0  --   --   HEPARINUNFRC  --   --  0.91*  CREATININE 1.51*  --   --   TROPONINIHS 17 17  --     Estimated Creatinine Clearance: 48.5 mL/min (A) (by C-G formula based on SCr of 1.51 mg/dL (H)).  Assessment: Anticoag: hep gtt new afib. Hep level 0.91(s/p large bolus) likely residual bolus effect after only 6 hrs with Scr 1.51 and CrCl only 48.  Goal of Therapy:  Heparin level 0.3-0.7 units/ml Monitor platelets by anticoagulation protocol: Yes   Plan:  Decrease heparin slightly to 1300 units/hr  Needs time to clear bolus effect. Recheck heparin level and CBC with AM Labs   Albert Powell S. 04/01/20, PharmD, BCPS Clinical Staff Pharmacist [email or web address] Albert Powell, Albert Powell 03/30/2020,8:50 PM

## 2020-03-30 NOTE — Consult Note (Addendum)
Cardiology Consultation:   Patient ID: Albert Powell; [id number]; 10-Apr-1941   Admit date: 03/30/2020 Date of Consult: 03/30/2020  Primary Care Provider: [name], MD Primary Cardiologist: Dr. Allyson Sabal, MD   Patient Profile:   Albert Powell is a 79 y.o. male with a hx of CAD s/p stenting x2 to RCA in 2003 with residual LAD disease, carotid artery disease s/p carotid artery stenosis s/p LICA endarterectomy 04/2013, peripheral arterial disease s/p right external iliac stenting 10/2013, hypertension, hyperlipidemia, and DM2 who is being seen today for the evaluation of atrial fibrillation with RVR at the request of Dr. Donnald Garre.  History of Present Illness:   Albert Powell is a 79 year old male with a history stated above who presented to [facility] on 03/30/2020 with acute onset of nausea/vomiting and chest pressure with radiation to his neck and bilateral jaws which began earlier today while attempting to leave his home for a regular MD checkup.  He states he was trying to get his rolling walker into the car when he had an acute onset of nausea at which time he vomited 1 time.  He went into the house and vomited an additional time and asked his wife to call EMS for transport to the ED.  He states he took one of his wife's sublingual nitroglycerin tablets without relief.   On EMS arrival, EKG performed which showed atrial fibrillation.  He was found to be hypertensive with a BP of 174/96 and a repeat at 200/99.  He was given IV Lopressor and nitroglycerin paste as well as ASA 324.  IV heparin was initiated out of concern for ACS and new onset atrial fibrillation.  WBC found to be elevated at 12.3. CXR with no active disease.  BNP elevated at 199.  Creatinine elevated at 1.51.  Home medications include: Amlodipine 10, atorvastatin 40, Plavix 75, Imdur 60, Hyzaar 100-25, Toprol-XL 25  On my exam, patient reports that approximately 2 weeks ago he had a severe, left sided headache and has had  residual dizziness and vertigo since that time.  He explains that he feels that he may have had a TIA.  He did not seek medical attention at that time.  Given his new onset A. fib and prior hist  Albert Powell is followed by Dr. Allyson Sabal for his cardiology care.  He underwent a Lexiscan Myoview stress test 02/07/2013 which was negative for ischemia however LHC was pursued given symptoms of angina. This showed widely patent stent in the dominant RCA with high-grade disease in his apical LAD and mid AV groove circumflex which are small vessels. He does have moderate disease in a large first obtuse marginal branch with normal LV function.  Plan was for medical treatment and increase Imdur from 30 to 60 mg p.o. daily. If recurrent symptoms, plan was for possible LAD intervention. He underwent carotid Doppler studies 04/25/2013 which showed 95% proximal LICA and therefore underwent endarterectomy per Dr. [name]. He had bilateral iliac stenting by Dr. [name] using VBX covered stents 03/30/2018 as well as renal artery stenting.  Past Medical History:  Diagnosis Date   Bradycardia, sinus 02/18/2013   CAD (coronary artery disease)    stent to RCA 2003 also had 40% lesion at that time; NUCLEAR STRESS TEST, 01/16/2010 - normal  now with cath 80-90% stenosis in LAD, will try medical therapy if no improvement PCI   Carotid artery disease ([name])    Cataract    Claudication ([name])    LEA DUPLEX, 07/07/2008 - Normal  COPD (chronic obstructive pulmonary disease) ([name])    DDD (degenerative disc disease) 2008   Diabetes mellitus    GERD (gastroesophageal reflux disease)    H/O hiatal hernia    History of colonoscopy 2004   finding of tics and AMV only no polpys   Hyperlipidemia 02/05/2013   Hypertension    Onychomycosis    Rosacea    Tobacco abuse 02/05/2013    Past Surgical History:  Procedure Laterality Date   ABDOMINAL AORTOGRAM W/LOWER EXTREMITY N/A 03/30/2018   Procedure: ABDOMINAL AORTOGRAM W/LOWER EXTREMITY;   Surgeon: Nada Libman, MD;  Location: [facility] INVASIVE CV LAB;  Service: Cardiovascular;  Laterality: N/A;   APPENDECTOMY     CARDIAC CATHETERIZATION  08/29/2002   2-vessel CAD with high-grade stenoses in RCA; stenting to RCA   CARDIAC CATHETERIZATION  2014   80-90% lesion will try medical therapy   CARDIAC SURGERY     stents  2003   CARDIOVERSION N/A 11/01/2013   Procedure: Dimple Nanas COMPRESSION;  Surgeon: [name], MD;  Location: [facility] CATH LAB;  Service: Cardiovascular;  Laterality: N/A;   CAROTID ANGIOGRAM N/A 04/25/2013   Procedure: CAROTID ANGIOGRAM;  Surgeon: [name], MD;  Location: [facility] CATH LAB;  Service: Cardiovascular;  Laterality: N/A;   CAROTID ENDARTERECTOMY     COLON RESECTION  3/04, 6/04    1 bleeding diverticulitis, 2 complete colectomy   COLON SURGERY  2005   renal pouch rectal anastimosis   CORONARY ANGIOPLASTY WITH STENT PLACEMENT  09/2002   2 stents to RCA   ENDARTERECTOMY Left 06/02/2013   Procedure: ENDARTERECTOMY CAROTID-LEFT;  Surgeon: Nada Libman, MD;  Location: [facility] OR;  Service: Vascular;  Laterality: Left;   LEFT HEART CATHETERIZATION WITH CORONARY ANGIOGRAM N/A 02/08/2013   Procedure: LEFT HEART CATHETERIZATION WITH CORONARY ANGIOGRAM;  Surgeon: [name], MD;  Location: [facility] CATH LAB;  Service: Cardiovascular;  Laterality: N/A;   LOWER EXTREMITY ANGIOGRAM N/A 10/20/2013   Procedure: LOWER EXTREMITY ANGIOGRAM;  Surgeon: [name], MD;  Location: [facility] CATH LAB;  Service: Cardiovascular;  Laterality: N/A;   PATCH ANGIOPLASTY Left 06/02/2013   Procedure: PATCH ANGIOPLASTY;  Surgeon: Nada Libman, MD;  Location: [facility] OR;  Service: Vascular;  Laterality: Left;   PERIPHERAL VASCULAR INTERVENTION Bilateral 03/30/2018   Procedure: PERIPHERAL VASCULAR INTERVENTION;  Surgeon: Nada Libman, MD;  Location: [facility] INVASIVE CV LAB;  Service: Cardiovascular;  Laterality: Bilateral;  common iliacs   TONSILLECTOMY       Prior to Admission medications    Medication Sig Start Date End Date Taking? Authorizing Provider  amLODipine (NORVASC) 10 MG tablet Take 10 mg by mouth at bedtime.     [provider]  atorvastatin (LIPITOR) 40 MG tablet Take 40 mg by mouth at bedtime.     [provider]  clopidogrel (PLAVIX) 75 MG tablet TAKE 1 TABLET (75 MG TOTAL) BY MOUTH DAILY. Patient taking differently: Take 75 mg by mouth at bedtime.  09/29/17   [name], MD  escitalopram (LEXAPRO) 20 MG tablet Take 20 mg by mouth daily. 02/14/18   [provider]  Fluticasone-Salmeterol (ADVAIR) 100-50 MCG/DOSE AEPB Inhale 1 puff into the lungs daily.     [provider]  glipiZIDE (GLUCOTROL) 5 MG tablet Take 5 mg by mouth daily at 12 noon.  02/25/17   [provider]  isosorbide mononitrate (IMDUR) 60 MG 24 hr tablet Take 1 tablet (60 mg total) by mouth daily. 02/09/13   Theressa Millard  C, MD  losartan-hydrochlorothiazide (HYZAAR) 100-25 MG per tablet Take 1 tablet by mouth at bedtime.     [provider]  metoprolol succinate (TOPROL-XL) 25 MG 24 hr tablet TAKE 1/2 TABLET BY MOUTH DAILY Patient taking differently: Take 12.5 mg by mouth at bedtime 02/09/14   [name], MD  pantoprazole (PROTONIX) 40 MG tablet Take 40 mg by mouth daily.    [provider]    Inpatient Medications: Scheduled Meds:  aspirin  324 mg Oral Once   heparin  5,500 Units Intravenous Once   nitroGLYCERIN  1 inch Topical Q6H   Continuous Infusions:  heparin     PRN Meds: metoprolol tartrate  Allergies:    Allergies  Allergen Reactions   Fentanyl Other (See Comments)    Behavioral changes   Gabapentin Other (See Comments)    ankle swells   Lisinopril Other (See Comments)    unknown   Lyrica [Pregabalin] Other (See Comments)    unknown   Metformin Diarrhea   Propofol Other (See Comments)    Heart rate dropped   Shellfish Allergy Other (See Comments)    Other Other-Mild skin reaction to shrimp     Social History:   Social History   Socioeconomic History   Marital status: Married    Spouse name: Not on file   Number of children: Not on file   Years of education: Not on file   Highest education level: Not on file  Occupational History   Not on file  Tobacco Use   Smoking status: Former Smoker    Packs/day: 0.75    Years: 57.00    Pack years: 42.75    Types: Cigarettes    Quit date: 04/06/2013    Years since quitting: 6.9   Smokeless tobacco: Former Neurosurgeon    Quit date: 04/14/2013   Tobacco comment: pt states that he is using the vapor cigs  Vaping Use   Vaping Use: Never used  Substance and Sexual Activity   Alcohol use: Yes    Alcohol/week: 1.0 standard drink    Types: 1 Glasses of wine per week   Drug use: No   Sexual activity: Not on file  Other Topics Concern   Not on file  Social History Narrative   Not on file   Social Determinants of Health   Financial Resource Strain:    Difficulty of Paying Living Expenses:   Food Insecurity:    Worried About Programme researcher, broadcasting/film/video in the Last Year:    Barista in the Last Year:   Transportation Needs:    Freight forwarder (Medical):    Lack of Transportation (Non-Medical):   Physical Activity:    Days of Exercise per Week:    Minutes of Exercise per Session:   Stress:    Feeling of Stress :   Social Connections:    Frequency of Communication with Friends and Family:    Frequency of Social Gatherings with Friends and Family:    Attends Religious Services:    Active Member of Clubs or Organizations:    Attends Engineer, structural:    Marital Status:   Intimate Partner Violence:    Fear of Current or Ex-Partner:    Emotionally Abused:    Physically Abused:    Sexually Abused:     Family History:   Family History  Problem Relation Age of Onset   Heart disease Father        heart attack  at 13   Heart attack Father    Hyperlipidemia Father    Colonic polyp Mother        that bled out    COPD Mother    Diabetes Mother    Heart disease Sister    Colonic polyp Sister    Stroke Maternal Grandfather    Heart attack Paternal Grandfather    Other Neg Hx        hypogonadism   Family Status:  Family Status  Relation Name Status   Father  Deceased at age 62   Mother  Deceased at age 65       perforated colon   Sister  Alive   MGF  Deceased at age 68   PGF  Deceased at age 65   MGM  Deceased at age 72   PGM  Deceased at age 30   Neg Hx  (Not Specified)    ROS:  Please see the history of present illness.  All other ROS reviewed and negative.     Physical Exam/Data:   Vitals:   03/30/20 1231 03/30/20 1233  BP: (!) 174/96   Pulse: 73   Resp: 16   Temp: 98.7 F (37.1 C)   TempSrc: Oral   SpO2: 96%   Weight:  106.6 kg  Height:   (1.753 m)   No intake or output data in the 24 hours ending 03/30/20 1321 Filed Weights   03/30/20 1233  Weight: 106.6 kg   Body mass index is 34.7 kg/m.   General: Well developed, well nourished, NAD Neck: Negative for carotid bruits. No JVD Lungs:Clear to ausculation bilaterally. No wheezes, rales, or rhonchi. Breathing is unlabored. Cardiovascular: Irregularly irregular with S1 S2. No murmurs Abdomen: Soft, non-tender, non-distended. No obvious abdominal masses. Extremities: No edema.  Radial pulses 2+ bilaterally Neuro: Alert and oriented. No focal deficits. No facial asymmetry. MAE spontaneously. Bilateral grips equal.  Psych: Responds to questions appropriately with normal affect.     EKG:  The EKG was personally reviewed and demonstrates: 03/30/2020 atrial fibrillation, HR 116 bpm with no acute changes Telemetry:  Telemetry was personally reviewed and demonstrates: 03/30/2020 atrial fibrillation with HR in the 100-120 range  Relevant CV Studies:  LHC 02/08/2013:  ANGIOGRAPHIC RESULTS:    1. Left main; normal  2. LAD; 80-90% focal distal and a 1.5-1.7 mm vessel 3. Left circumflex; nondominant with a 90% AV  groove circumflex stenosis and a small vessel. There was a moderate to large obtuse marginal branch that had a 60% proximal segmental calcified stenosis.[name]  4. Right coronary artery; dominant with anterior takeoff, widely patent stents with no other significant disease 5. Left ventriculography; RAO left ventriculogram was performed using  25 mL of Visipaque dye at 12 mL/second. The overall LVEF estimated  60 % Without wall motion abnormalities   IMPRESSION:Mr. Katich has a widely patent stent in the dominant RCA with high-grade disease in his apical LAD and mid AV groove circumflex which are small vessels. He does have moderate disease in a large first obtuse marginal branch with normal LV function. At this point I'm going to treat him medically and increase his Imdur from 30-60 mg a day. If he has recalcitrant symptoms all consider intervention on his LAD was a moderate circumflex and obtuse marginal branches. The sheath was removed and  A TR band was placed on the right wrist to achieve patent hemostasis. The patient left the Cath Lab in stable condition. He'll be gently hydrated for 2  hours and discharged from with close followup in the office by a mid-level provider.   Laboratory Data:  ChemistryNo results for input(s): NA, K, CL, CO2, GLUCOSE, BUN, CREATININE, CALCIUM, GFRNONAA, GFRAA, ANIONGAP in the last 168 hours.  Total Protein  Date Value Ref Range Status  06/05/2013 6.7 6.0 - 8.3 g/dL Final   Albumin  Date Value Ref Range Status  06/05/2013 3.4 (L) 3.5 - 5.2 g/dL Final   AST  Date Value Ref Range Status  06/05/2013 15 0 - 37 U/L Final   ALT  Date Value Ref Range Status  06/05/2013 16 0 - 53 U/L Final   Alkaline Phosphatase  Date Value Ref Range Status  06/05/2013 64 39 - 117 U/L Final   Total Bilirubin  Date Value Ref Range Status  06/05/2013 0.8 0.3 - 1.2 mg/dL Final   Hematology Recent Labs  Lab 03/30/20 1230  WBC 12.3*  RBC 5.00  HGB 14.8  HCT 44.7  MCV 89.4   MCH 29.6  MCHC 33.1  RDW 13.5  PLT 252   Cardiac EnzymesNo results for input(s): TROPONINI in the last 168 hours. No results for input(s): TROPIPOC in the last 168 hours.  BNPNo results for input(s): BNP, PROBNP in the last 168 hours.  DDimer No results for input(s): DDIMER in the last 168 hours. TSH:  Lab Results  Component Value Date   TSH 5.25 (H) 12/07/2014   Lipids:No results found for: CHOL, HDL, LDLCALC, LDLDIRECT, TRIG, CHOLHDL HgbA1c:No results found for: HGBA1C  Radiology/Studies:  No results found.  Assessment and Plan:   1. New onset atrial fibrillation: -Pt presented to [facility] on 03/30/2020 after acute onset of nausea and vomiting followed by chest discomfort found to be in new onset atrial fibrillation on EKG per EMS. -IV heparin initiated on presentation out of concern for ACS/AF given history of CAD, carotid artery disease and peripheral arterial disease. -Telemetry shows atrial fibrillation with rates in the low 100 bpm range -Start metoprolol 25 mg p.o. BID, Hyzaar and titrate for BP and heart rate -Plan for AF will likely be transition to oral anticoagulant after ACS event ruled out (LHC for Monday) and DCCV in 3 to 4 weeks if patient remains in AF -Obtain echocardiogram while inpatient  2.  Recent severe headache: -Patient reports a severe headache which occurred approximately 2 weeks ago and has since had residual dizziness, vertigo and difficulty with ambulation -Will obtain stat head CT at this time to rule out acute event given suspicion for TIA Bernarda Caffey secondary to atrial fibrillation -Neuro exam normal -Obtain echocardiogram as above -Consider neurology consultation  3.  Hypertensive urgency: -SBP on presentation 174/69 up to 200 -Remains significantly hypertensive on my exam -Start metoprolol 25mg  pO BID, Hyzaar 100-25>>up titrate as tolerated   4.  History of CAD s/p PCI x2 to RCA in 2003 with residual LAD/LCx disease per cath 2014; w/ chest  pain: -As above, patient has significant history of CAD, carotid artery disease and peripheral arterial disease -Had an episode of chest tightness at symptom onset with unclear etiology.  Symptoms may be secondary to atrial fibrillation with elevated rates however given the above, cannot completely rule out coronary event.  Given the duration since last ischemic evaluation, will plan for LHC on Monday for further coronary assessment. -Denies chest pain -Continue IV heparin -hsT, 17 with pending repeat -EKG with no ischemic changes -Cardiac catheterization was discussed with the patient fully. The patient understands that risks include but are not limited to  stroke (1 in 1000), death (1 in 1000), kidney failure [usually temporary] (1 in 500), bleeding (1 in 200), allergic reaction [possibly serious] (1 in 200).  The patient understands and is willing to proceed.    5.  HLD: -Last LDL, 57 on 12/25/2017 -Continue high intensity atorvastatin  6.  Carotid arterial disease: -Followed by Dr. [name] s/p endarterectomy secondary to severe proximal left internal carotid artery stenosis 06/02/2013 -Continue Plavix  7.  Peripheral arterial disease: -Underwent diamondback orbital rotational arthrectomy with PTA and stenting to the right external iliac artery 10/20/2013 with ultimate bilateral iliac stenting per Dr. [name] 03/30/2018 with renal artery stenting -Continue Plavix  8.  Acute kidney injury: -Baseline creatinine appears to be in the 1.0 range however levels are from 10/2013 and before -Presenting creatinine, 1.50 -Avoid nephrotoxic medications -Will IV fluid hydrate prior to [facility] -Daily BMET    For questions or updates, please contact [facility] Please consult [email or web address] for contact info under Cardiology/STEMI.   Raliegh Ip NP-C [facility] Pager: [phone] 03/30/2020 1:21 PM  Patient examined chart reviewed. Discussed care with patient, wife, NP and Dr Ophelia Charter primary.  Exam with overweight white male Left CEA with residual bruit Clear lungs no murmur soft abdomen no edema and diminished peripheral pulses. First concern with headache, HTN and PAF with nausea / vomiting is  Cerebellar stroke/TIA.  Will order non contrast head CT although he has already been started on heparin. Troponin mildly elevated with non specific ECG changes Known CAD with residual disease over 7 years since last cath Trend serial Troponin / ECG;s Cath on Monday Risks including stroke , MI, bleeding and need for surgery discussed willing to proceed. May need hydration Sunday evening. Once CAD progression r/o can deal with PAF. Start metoprolol and ARB to bring BP down If echo shows reduced EF can consider TEE/DCC after cath otherwise may be d/c with elective DCC in 3 weeks with oral DOAC.  Charlton Haws MD [facility]

## 2020-03-30 NOTE — Progress Notes (Signed)
ANTICOAGULATION CONSULT NOTE - Initial Consult  Pharmacy Consult for Heparin Indication: atrial fibrillation  Allergies  Allergen Reactions  . Fentanyl Other (See Comments)    Behavioral changes  . Gabapentin Other (See Comments)    ankle swells  . Lisinopril Other (See Comments)    unknown  . Lyrica [Pregabalin] Other (See Comments)    unknown  . Metformin Diarrhea  . Propofol Other (See Comments)    Heart rate dropped  . Shellfish Allergy Other (See Comments)    Other Other-Mild skin reaction to shrimp    Patient Measurements: Height: 5\' 9"  (175.3 cm) Weight: 106.6 kg (235 lb) IBW/kg (Calculated) : 70.7 Heparin Dosing Weight: 93.8kg  Vital Signs: Temp: 98.7 F (37.1 C) (07/16 1231) Temp Source: Oral (07/16 1231) BP: 174/96 (07/16 1231) Pulse Rate: 73 (07/16 1231)  Labs: Recent Labs    03/30/20 1230  HGB 14.8  HCT 44.7  PLT 252    CrCl cannot be calculated (Patient's most recent lab result is older than the maximum 21 days allowed.).   Medical History: Past Medical History:  Diagnosis Date  . Bradycardia, sinus 02/18/2013  . CAD (coronary artery disease)    stent to RCA 2003 also had 40% lesion at that time; NUCLEAR STRESS TEST, 01/16/2010 - normal  now with cath 80-90% stenosis in LAD, will try medical therapy if no improvement PCI  . Carotid artery disease ([name])   . Cataract   . Claudication ([name])    LEA DUPLEX, 07/07/2008 - Normal  . COPD (chronic obstructive pulmonary disease) ([name])   . DDD (degenerative disc disease) 2008  . Diabetes mellitus   . GERD (gastroesophageal reflux disease)   . H/O hiatal hernia   . History of colonoscopy 2004   finding of tics and AMV only no polpys  . Hyperlipidemia 02/05/2013  . Hypertension   . Onychomycosis   . Rosacea   . Tobacco abuse 02/05/2013    Medications:  Scheduled:  . aspirin  324 mg Oral Once  . nitroGLYCERIN  1 inch Topical Q6H    Assessment: Patient is a 79yo male who presents with atrial  fibrillation. H/H & plt wnl. Patient not on anticoagulation at home.  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

## 2020-03-31 DIAGNOSIS — I2510 Atherosclerotic heart disease of native coronary artery without angina pectoris: Secondary | ICD-10-CM

## 2020-03-31 DIAGNOSIS — R072 Precordial pain: Secondary | ICD-10-CM

## 2020-03-31 LAB — GLUCOSE, CAPILLARY
Glucose-Capillary: 111 mg/dL — ABNORMAL HIGH (ref 70–99)
Glucose-Capillary: 131 mg/dL — ABNORMAL HIGH (ref 70–99)
Glucose-Capillary: 129 mg/dL — ABNORMAL HIGH (ref 70–99)
Glucose-Capillary: 93 mg/dL (ref 70–99)

## 2020-03-31 LAB — CBC
HCT: 37.2 % — ABNORMAL LOW (ref 39.0–52.0)
Hemoglobin: 12.4 g/dL — ABNORMAL LOW (ref 13.0–17.0)
MCH: 30.3 pg (ref 26.0–34.0)
MCHC: 33.3 g/dL (ref 30.0–36.0)
MCV: 91 fL (ref 80.0–100.0)
Platelets: 247 10*3/uL (ref 150–400)
RBC: 4.09 MIL/uL — ABNORMAL LOW (ref 4.22–5.81)
RDW: 13.7 % (ref 11.5–15.5)
WBC: 9.7 10*3/uL (ref 4.0–10.5)
nRBC: 0 % (ref 0.0–0.2)

## 2020-03-31 LAB — HEPARIN LEVEL (UNFRACTIONATED): Heparin Unfractionated: 0.66 IU/mL (ref 0.30–0.70)

## 2020-03-31 NOTE — Progress Notes (Signed)
Progress Note  Patient Name: Albert Powell Date of Encounter: 03/31/2020  [facility] Cardiologist: Nanetta Batty, MD   Subjective   Feels well today. Denies any chest pain, palpitations, HA.   Inpatient Medications    Scheduled Meds: . atorvastatin  40 mg Oral QHS  . clopidogrel  75 mg Oral Daily  . docusate sodium  100 mg Oral BID  . escitalopram  20 mg Oral Daily  . famotidine  20 mg Oral QODAY  . losartan  100 mg Oral Daily   And  . hydrochlorothiazide  25 mg Oral Daily  . insulin aspart  0-15 Units Subcutaneous TID WC  . insulin aspart  0-5 Units Subcutaneous QHS  . isosorbide mononitrate  60 mg Oral Daily  . metoprolol tartrate  25 mg Oral BID  . mometasone-formoterol  2 puff Inhalation BID  . nicotine  14 mg Transdermal Daily  . nitroGLYCERIN  1 inch Topical Q6H  . pantoprazole  40 mg Oral Daily   Continuous Infusions: . diltiazem (CARDIZEM) infusion 5 mg/hr (03/30/20 1828)  . heparin 1,300 Units/hr (03/31/20 0451)   PRN Meds: acetaminophen **OR** acetaminophen, bisacodyl, hydrALAZINE, HYDROcodone-acetaminophen, metoprolol tartrate, morphine injection, ondansetron **OR** ondansetron (ZOFRAN) IV, polyethylene glycol, polyvinyl alcohol, zolpidem   Vital Signs    Vitals:   03/30/20 2022 03/31/20 0022 03/31/20 0449 03/31/20 0921  BP: 136/72 114/60 (!) 127/38 131/62  Pulse: 63 68 66 66  Resp: 15 17 18 16   Temp: 98.2 F (36.8 C) 98.3 F (36.8 C) 98.2 F (36.8 C) 98.5 F (36.9 C)  TempSrc: Oral Oral Oral Oral  SpO2: 100% 98% 96% 99%  Weight:   103.1 kg   Height:        Intake/Output Summary (Last 24 hours) at 03/31/2020 0936 Last data filed at 03/31/2020 0820 Gross per 24 hour  Intake 1160 ml  Output 125 ml  Net 1035 ml   Last 3 Weights 03/31/2020 03/30/2020 03/31/2019  Weight (lbs) 227 lb 6.4 oz 235 lb 230 lb 12.8 oz  Weight (kg) 103.148 kg 106.595 kg 104.69 kg      Telemetry    NSR- Personally Reviewed  ECG    Pending this am -  Personally Reviewed  Physical Exam   GEN: obese WM in No acute distress.   Neck: No JVD Cardiac: RRR, no murmurs, rubs, or gallops.  Respiratory: Clear to auscultation bilaterally. GI: Soft, nontender, non-distended  MS: No edema; No deformity. Neuro:  Nonfocal  Psych: Normal affect   Labs    High Sensitivity Troponin:   Recent Labs  Lab 03/30/20 1230 03/30/20 1524  TROPONINIHS 17 17      Chemistry Recent Labs  Lab 03/30/20 1230  NA 137  K 3.8  CL 97*  CO2 23  GLUCOSE 179*  BUN 16  CREATININE 1.51*  CALCIUM 9.6  PROT 7.1  ALBUMIN 4.0  AST 25  ALT 35  ALKPHOS 76  BILITOT 0.9  GFRNONAA 44*  GFRAA 51*  ANIONGAP 17*     Hematology Recent Labs  Lab 03/30/20 1230  WBC 12.3*  RBC 5.00  HGB 14.8  HCT 44.7  MCV 89.4  MCH 29.6  MCHC 33.1  RDW 13.5  PLT 252    BNP Recent Labs  Lab 03/30/20 1251  BNP 199.3*     DDimer No results for input(s): DDIMER in the last 168 hours.   Radiology    CT HEAD WO CONTRAST  Result Date: 03/30/2020 CLINICAL DATA:  Transient ischemic attack (  TIA). Additional provided: New onset atrial fibrillation, EXAM: CT HEAD WITHOUT CONTRAST TECHNIQUE: Contiguous axial images were obtained from the base of the skull through the vertex without intravenous contrast. COMPARISON:  Head CT 03/27/2017 FINDINGS: Brain: Stable, moderate generalized parenchymal atrophy. Stable, mild ill-defined hypoattenuation within the cerebral white matter is nonspecific, but consistent with chronic small vessel ischemic disease. Redemonstrated small chronic lacunar infarct within the right caudate nucleus (series 2, image 21). There is no acute intracranial hemorrhage. No demarcated cortical infarct is identified. No extra-axial fluid collection. No evidence of intracranial mass. No midline shift. Vascular: No hyperdense vessel.  Atherosclerotic calcifications. Skull: Normal. Negative for fracture or focal lesion. Sinuses/Orbits: Visualized orbits show no  acute finding. Mild ethmoid sinus mucosal thickening. Small amount of frothy secretions within the right maxillary sinus. No significant mastoid effusion. IMPRESSION: No CT evidence of acute intracranial abnormality. Moderate generalized parenchymal atrophy and mild chronic small vessel ischemic disease, stable as compared to the head CT of 03/27/2017. Redemonstrated small chronic right basal ganglia lacunar infarct. Paranasal sinus disease as described. Electronically Signed   By: Jackey Loge DO   On: 03/30/2020 18:55   DG Chest Port 1 View  Result Date: 03/30/2020 CLINICAL DATA:  Chest pain. EXAM: PORTABLE CHEST 1 VIEW COMPARISON:  May 31, 2013. FINDINGS: Stable cardiomediastinal silhouette. No pneumothorax or pleural effusion is noted. No acute pulmonary disease is noted. Bony thorax is unremarkable. IMPRESSION: No active disease. Aortic Atherosclerosis (ICD10-I70.0). Electronically Signed   By: Lupita Raider M.D.   On: 03/30/2020 13:58   ECHOCARDIOGRAM COMPLETE  Result Date: 03/30/2020    ECHOCARDIOGRAM REPORT   Patient Name:   Albert Powell Date of Exam: 03/30/2020 Medical Rec #:  [id number]        Height:       69.0 in Accession #:    [id number]       Weight:       235.0 lb Date of Birth:  January 09, 1941        BSA:          2.213 m Patient Age:    79 years         BP:           156/95 mmHg Patient Gender: M                HR:           105 bpm. Exam Location:  Inpatient Procedure: 2D Echo, Cardiac Doppler and Color Doppler Indications:    R07.9* Chest pain, unspecified  History:        Patient has no prior history of Echocardiogram examinations.                 CAD, Carotid Disease and COPD, Arrythmias:Atrial Fibrillation;                 Risk Factors:Hypertension, Diabetes, Dyslipidemia and Former                 Smoker.  Sonographer:    Elmarie Shiley Dance Referring Phys: [phone] JILL D MCDANIEL IMPRESSIONS  1. Left ventricular ejection fraction, by estimation, is 55 to 60%. The left ventricle has  normal function. The left ventricle has no regional wall motion abnormalities. Left ventricular diastolic parameters are consistent with Grade I diastolic dysfunction (impaired relaxation).  2. Right ventricular systolic function is normal. The right ventricular size is normal.  3. The mitral valve is normal in structure. No evidence of mitral valve regurgitation. No  evidence of mitral stenosis.  4. The aortic valve is tricuspid. Aortic valve regurgitation is not visualized. Mild aortic valve sclerosis is present, with no evidence of aortic valve stenosis.  5. The inferior vena cava is normal in size with greater than 50% respiratory variability, suggesting right atrial pressure of 3 mmHg. FINDINGS  Left Ventricle: Left ventricular ejection fraction, by estimation, is 55 to 60%. The left ventricle has normal function. The left ventricle has no regional wall motion abnormalities. The left ventricular internal cavity size was normal in size. There is  no left ventricular hypertrophy. Left ventricular diastolic parameters are consistent with Grade I diastolic dysfunction (impaired relaxation). Right Ventricle: The right ventricular size is normal. No increase in right ventricular wall thickness. Right ventricular systolic function is normal. Left Atrium: Left atrial size was normal in size. Right Atrium: Right atrial size was normal in size. Pericardium: There is no evidence of pericardial effusion. Mitral Valve: The mitral valve is normal in structure. There is mild calcification of the mitral valve leaflet(s). Normal mobility of the mitral valve leaflets. Mild mitral annular calcification. No evidence of mitral valve regurgitation. No evidence of mitral valve stenosis. Tricuspid Valve: The tricuspid valve is normal in structure. Tricuspid valve regurgitation is not demonstrated. No evidence of tricuspid stenosis. Aortic Valve: The aortic valve is tricuspid. Aortic valve regurgitation is not visualized. Mild aortic  valve sclerosis is present, with no evidence of aortic valve stenosis. Pulmonic Valve: The pulmonic valve was normal in structure. Pulmonic valve regurgitation is not visualized. No evidence of pulmonic stenosis. Aorta: The aortic root is normal in size and structure. Venous: The inferior vena cava is normal in size with greater than 50% respiratory variability, suggesting right atrial pressure of 3 mmHg. IAS/Shunts: No atrial level shunt detected by color flow Doppler.  LEFT VENTRICLE PLAX 2D LVIDd:         4.20 cm LVIDs:         3.60 cm LV PW:         1.00 cm LV IVS:        1.00 cm LVOT diam:     2.30 cm LV SV:         69 LV SV Index:   31 LVOT Area:     4.15 cm  RIGHT VENTRICLE          IVC RV Basal diam:  2.00 cm  IVC diam: 1.30 cm TAPSE (M-mode): 1.5 cm LEFT ATRIUM             Index       RIGHT ATRIUM           Index LA diam:        3.60 cm 1.63 cm/m  RA Area:     15.40 cm LA Vol (A2C):   63.4 ml 28.65 ml/m RA Volume:   29.90 ml  13.51 ml/m LA Vol (A4C):   35.9 ml 16.23 ml/m LA Biplane Vol: 50.7 ml 22.91 ml/m  AORTIC VALVE LVOT Vmax:   96.30 cm/s LVOT Vmean:  59.800 cm/s LVOT VTI:    0.167 m  AORTA Ao Root diam: 3.70 cm Ao Asc diam:  3.30 cm MITRAL VALVE MV Area (PHT): 2.73 cm     SHUNTS MV Decel Time: 278 msec     Systemic VTI:  0.17 m MV E velocity: 71.60 cm/s   Systemic Diam: 2.30 cm MV A velocity: 110.00 cm/s MV E/A ratio:  0.65 Donato Schultz MD Electronically signed by Donato Schultz MD  Signature Date/Time: 03/30/2020/4:31:02 PM    Final     Cardiac Studies   LHC 02/08/2013:  ANGIOGRAPHIC RESULTS:   1. Left main; normal  2. LAD; 80-90% focal distal and a 1.5-1.7 mm vessel 3. Left circumflex; nondominant with a 90% AV groove circumflex stenosis and a small vessel. There was a moderate to large obtuse marginal branch that had a 60% proximal segmental calcified stenosis.[name]  4. Right coronary artery; dominant with anterior takeoff, widely patent stents with no other significant disease 5. Left  ventriculography; RAO left ventriculogram was performed using  25 mL of Visipaque dye at 12 mL/second. The overall LVEF estimated  60 % Without wall motion abnormalities  IMPRESSION:Albert Powell has a widely patent stent in the dominant RCA with high-grade disease in his apical LAD and mid AV groove circumflex which are small vessels. He does have moderate disease in a large first obtuse marginal branch with normal LV function. At this point I'm going to treat him medically and increase his Imdur from 30-60 mg a day. If he has recalcitrant symptoms all consider intervention on his LAD was a moderate circumflex and obtuse marginal branches. The sheath was removed and A TR band was placed on the right wrist to achieve patent hemostasis. The patient left the Cath Lab in stable condition. He'll be gently hydrated for 2 hours and discharged from with close followup in the office by a mid-level provider.  Patient Profile     79 y.o. male with a hx of CAD s/p stenting x2 to RCA in 2003 with residual LAD disease, carotid artery disease s/p carotid artery stenosis s/p LICA endarterectomy 04/2013, peripheral arterial disease s/p right external iliac stenting 10/2013, hypertension, hyperlipidemia, and DM2 who is being seen today for the evaluation of atrial fibrillation with RVR at the request of Dr. Donnald Garre.  Assessment & Plan    1. New onset atrial fibrillation: -Pt presented to [facility] on 03/30/2020 after acute onset of nausea and vomiting followed by chest discomfort found to be in new onset atrial fibrillation on EKG per EMS. -IV heparin initiated on presentation out of concern for ACS/AF given history of CAD, carotid artery disease and peripheral arterial disease. -Telemetry shows conversion to NSR -on metoprolol 25 mg p.o. BID, Hyzaar and titrate for BP and heart rate -Plan for AF will likely be transition to oral anticoagulant after ACS event ruled out (LHC for Monday) -Echo unremarkable with normal  EF  2.  Recent severe headache: -CT negative for acute CVA/bleed -Neuro exam normal - HA resolved.   3.  Hypertensive urgency: -SBP on presentation 174/69 up to 200 -now normotensive  4.  History of CAD s/p PCI x2 to RCA in 2003 with residual LAD/LCx disease per cath 2014; w/ chest pain: -As above, patient has significant history of CAD, carotid artery disease and peripheral arterial disease -Had an episode of chest tightness at symptom onset with unclear etiology.  Symptoms may be secondary to atrial fibrillation with elevated rates however given the above, cannot completely rule out coronary event.   -Denies chest pain -Continue IV heparin -hsT, 17 with pending repeat -EKG with no ischemic changes -Plan Cardiac catheterization on Monday  5.  HLD: -Last LDL, 57 on 12/25/2017 -Continue high intensity atorvastatin  6.  Carotid arterial disease: -Followed by Dr. [name] s/p endarterectomy secondary to severe proximal left internal carotid artery stenosis 06/02/2013 -Continue Plavix  7.  Peripheral arterial disease: -Underwent diamondback orbital rotational arthrectomy with PTA and stenting to the right  external iliac artery 10/20/2013 with ultimate bilateral iliac stenting per Dr. [name] 03/30/2018 with renal artery stenting -Continue Plavix  8.  Acute kidney injury: -Baseline creatinine appears to be in the 1.0 range however levels are from 10/2013 and before -Presenting creatinine, 1.50 -Avoid nephrotoxic medications -Will IV fluid hydrate prior to [facility] -Daily BMET     For questions or updates, please contact [facility] Please consult [email or web address] for contact info under        Signed, Meigan Pates [place], MD  03/31/2020, 9:36 AM

## 2020-03-31 NOTE — Progress Notes (Signed)
[facility] PROGRESS NOTE    Progress Note  Albert Powell  KJZ:[id number] DOB: 1941-01-24 DOA: 03/30/2020 PCP: [name], MD     Brief Narrative:   Albert Powell is an 79 y.o. male past medical history significant for essential hypertension hyperlipidemia CAD and chest pain which developed nausea vomiting and sweating profusely while walking to the car, later developed substernal chest pain and jaw pain, EMS was called she was found in A. fib.  The ED was found to be in new onset A. fib with RVR started on Lopressor and heparin cardiac biomarkers negative.  Assessment/Plan:   New onset atrial fibrillation ([name]): Started on diltiazem drip and IV heparin cardiac biomarkers negative. 2D echo no wall motion abnormality grade 1 diastolic heart failure. Cardiology was consulted recommended a CT of the head that showed no acute abnormalities.  Given her history of CAD carotid artery disease and peripheral vascular disease they are concerned for acute coronary syndrome. He also recommended to start her on oral metoprolol for possible cardiac cath. To transition to oral anticoagulation after ischemic work-up is performed. DCCV in 4 weeks as an outpatient.  CAD hx of 2 stents to RCA in 2003,  80-90% stenosis of LAD/CFX June 2014PAD: Continue Plavix and Imdur. Patient with significant history of CAD for cardiac cath probably Monday.  Hypertensive urgency: She was started on diltiazem for rate control, metoprolol for possible ACS syndrome. She was continued on Hyzaar receiving sublingual as needed. Her blood pressure seems to be stable at this point in time.  Essential hypertension: Continue to hold Norvasc continue Hyzaar, metoprolol and diltiazem drip for now.  Hyperlipidemia: Continue Lipitor.  Diabetes mellitus type 2 with peripheral vascular disease: With an A1c of 6.7 continue long-acting insulin plus sliding scale.  Tobacco  dependence: Counseling.  Obesity: Counseling  DVT prophylaxis: heparin Family Communication:none Status is: Observation  The patient remains OBS appropriate and will d/c before 2 midnights.  Dispo: The patient is from: Home              Anticipated d/c is to: Home              Anticipated d/c date is: 3 days              Patient currently is not medically stable to d/c.        Code Status:     Code Status Orders  (From admission, onward)         Start     Ordered   03/30/20 1533  Full code  Continuous        03/30/20 1535        Code Status History    Date Active Date Inactive Code Status Order ID Comments User Context   03/30/2018 1202 03/31/2018 1439 Full Code [id number]  Nada Libman, MD Inpatient   06/02/2013 1252 06/03/2013 1334 Full Code [id number]  Garth Schlatter Inpatient   02/04/2013 2243 02/08/2013 1241 Full Code [id number]  Eduard Clos, MD Inpatient   Advance Care Planning Activity        IV Access:    Peripheral IV   Procedures and diagnostic studies:   CT HEAD WO CONTRAST  Result Date: 03/30/2020 CLINICAL DATA:  Transient ischemic attack (TIA). Additional provided: New onset atrial fibrillation, EXAM: CT HEAD WITHOUT CONTRAST TECHNIQUE: Contiguous axial images were obtained from the base of the skull through the vertex without intravenous contrast. COMPARISON:  Head CT 03/27/2017 FINDINGS: Brain: Stable, moderate  generalized parenchymal atrophy. Stable, mild ill-defined hypoattenuation within the cerebral white matter is nonspecific, but consistent with chronic small vessel ischemic disease. Redemonstrated small chronic lacunar infarct within the right caudate nucleus (series 2, image 21). There is no acute intracranial hemorrhage. No demarcated cortical infarct is identified. No extra-axial fluid collection. No evidence of intracranial mass. No midline shift. Vascular: No hyperdense vessel.  Atherosclerotic calcifications. Skull:  Normal. Negative for fracture or focal lesion. Sinuses/Orbits: Visualized orbits show no acute finding. Mild ethmoid sinus mucosal thickening. Small amount of frothy secretions within the right maxillary sinus. No significant mastoid effusion. IMPRESSION: No CT evidence of acute intracranial abnormality. Moderate generalized parenchymal atrophy and mild chronic small vessel ischemic disease, stable as compared to the head CT of 03/27/2017. Redemonstrated small chronic right basal ganglia lacunar infarct. Paranasal sinus disease as described. Electronically Signed   By: Jackey Loge DO   On: 03/30/2020 18:55   DG Chest Port 1 View  Result Date: 03/30/2020 CLINICAL DATA:  Chest pain. EXAM: PORTABLE CHEST 1 VIEW COMPARISON:  May 31, 2013. FINDINGS: Stable cardiomediastinal silhouette. No pneumothorax or pleural effusion is noted. No acute pulmonary disease is noted. Bony thorax is unremarkable. IMPRESSION: No active disease. Aortic Atherosclerosis (ICD10-I70.0). Electronically Signed   By: Lupita Raider M.D.   On: 03/30/2020 13:58   ECHOCARDIOGRAM COMPLETE  Result Date: 03/30/2020    ECHOCARDIOGRAM REPORT   Patient Name:   Albert Powell Date of Exam: 03/30/2020 Medical Rec #:  [id number]        Height:       69.0 in Accession #:    [id number]       Weight:       235.0 lb Date of Birth:  March 19, 1941        BSA:          2.213 m Patient Age:    79 years         BP:           156/95 mmHg Patient Gender: M                HR:           105 bpm. Exam Location:  Inpatient Procedure: 2D Echo, Cardiac Doppler and Color Doppler Indications:    R07.9* Chest pain, unspecified  History:        Patient has no prior history of Echocardiogram examinations.                 CAD, Carotid Disease and COPD, Arrythmias:Atrial Fibrillation;                 Risk Factors:Hypertension, Diabetes, Dyslipidemia and Former                 Smoker.  Sonographer:    Elmarie Shiley Dance Referring Phys: [phone] JILL D MCDANIEL IMPRESSIONS  1.  Left ventricular ejection fraction, by estimation, is 55 to 60%. The left ventricle has normal function. The left ventricle has no regional wall motion abnormalities. Left ventricular diastolic parameters are consistent with Grade I diastolic dysfunction (impaired relaxation).  2. Right ventricular systolic function is normal. The right ventricular size is normal.  3. The mitral valve is normal in structure. No evidence of mitral valve regurgitation. No evidence of mitral stenosis.  4. The aortic valve is tricuspid. Aortic valve regurgitation is not visualized. Mild aortic valve sclerosis is present, with no evidence of aortic valve stenosis.  5. The inferior vena cava is normal in  size with greater than 50% respiratory variability, suggesting right atrial pressure of 3 mmHg. FINDINGS  Left Ventricle: Left ventricular ejection fraction, by estimation, is 55 to 60%. The left ventricle has normal function. The left ventricle has no regional wall motion abnormalities. The left ventricular internal cavity size was normal in size. There is  no left ventricular hypertrophy. Left ventricular diastolic parameters are consistent with Grade I diastolic dysfunction (impaired relaxation). Right Ventricle: The right ventricular size is normal. No increase in right ventricular wall thickness. Right ventricular systolic function is normal. Left Atrium: Left atrial size was normal in size. Right Atrium: Right atrial size was normal in size. Pericardium: There is no evidence of pericardial effusion. Mitral Valve: The mitral valve is normal in structure. There is mild calcification of the mitral valve leaflet(s). Normal mobility of the mitral valve leaflets. Mild mitral annular calcification. No evidence of mitral valve regurgitation. No evidence of mitral valve stenosis. Tricuspid Valve: The tricuspid valve is normal in structure. Tricuspid valve regurgitation is not demonstrated. No evidence of tricuspid stenosis. Aortic Valve: The  aortic valve is tricuspid. Aortic valve regurgitation is not visualized. Mild aortic valve sclerosis is present, with no evidence of aortic valve stenosis. Pulmonic Valve: The pulmonic valve was normal in structure. Pulmonic valve regurgitation is not visualized. No evidence of pulmonic stenosis. Aorta: The aortic root is normal in size and structure. Venous: The inferior vena cava is normal in size with greater than 50% respiratory variability, suggesting right atrial pressure of 3 mmHg. IAS/Shunts: No atrial level shunt detected by color flow Doppler.  LEFT VENTRICLE PLAX 2D LVIDd:         4.20 cm LVIDs:         3.60 cm LV PW:         1.00 cm LV IVS:        1.00 cm LVOT diam:     2.30 cm LV SV:         69 LV SV Index:   31 LVOT Area:     4.15 cm  RIGHT VENTRICLE          IVC RV Basal diam:  2.00 cm  IVC diam: 1.30 cm TAPSE (M-mode): 1.5 cm LEFT ATRIUM             Index       RIGHT ATRIUM           Index LA diam:        3.60 cm 1.63 cm/m  RA Area:     15.40 cm LA Vol (A2C):   63.4 ml 28.65 ml/m RA Volume:   29.90 ml  13.51 ml/m LA Vol (A4C):   35.9 ml 16.23 ml/m LA Biplane Vol: 50.7 ml 22.91 ml/m  AORTIC VALVE LVOT Vmax:   96.30 cm/s LVOT Vmean:  59.800 cm/s LVOT VTI:    0.167 m  AORTA Ao Root diam: 3.70 cm Ao Asc diam:  3.30 cm MITRAL VALVE MV Area (PHT): 2.73 cm     SHUNTS MV Decel Time: 278 msec     Systemic VTI:  0.17 m MV E velocity: 71.60 cm/s   Systemic Diam: 2.30 cm MV A velocity: 110.00 cm/s MV E/A ratio:  0.65 Donato Schultz MD Electronically signed by Donato Schultz MD Signature Date/Time: 03/30/2020/4:31:02 PM    Final      Medical Consultants:    None.  Anti-Infectives:   none  Subjective:    Albert Powell currently chest pain and shortness of breath  free he relates he is currently asymptomatic tolerating his diet.  Objective:    Vitals:   03/30/20 1723 03/30/20 2022 03/31/20 0022 03/31/20 0449  BP: (!) 163/105 136/72 114/60 (!) 127/38  Pulse: 85 63 68 66  Resp: 20 15 17 18    Temp: 98.7 F (37.1 C) 98.2 F (36.8 C) 98.3 F (36.8 C) 98.2 F (36.8 C)  TempSrc: Oral Oral Oral Oral  SpO2: 98% 100% 98% 96%  Weight:    103.1 kg  Height:       SpO2: 96 %   Intake/Output Summary (Last 24 hours) at 03/31/2020 0726 Last data filed at 03/31/2020 0600 Gross per 24 hour  Intake 680 ml  Output 125 ml  Net 555 ml   Filed Weights   03/30/20 1233 03/31/20 0449  Weight: 106.6 kg 103.1 kg    Exam: General exam: In no acute distress. Respiratory system: Good air movement and clear to auscultation. Cardiovascular system: S1 & S2 heard, RRR. No JVD. Gastrointestinal system: Abdomen is nondistended, soft and nontender.  Extremities: No pedal edema. Skin: No rashes, lesions or ulcers Psychiatry: Judgement and insight appear normal. Mood & affect appropriate.    Data Reviewed:    Labs: Basic Metabolic Panel: Recent Labs  Lab 03/30/20 1230  NA 137  K 3.8  CL 97*  CO2 23  GLUCOSE 179*  BUN 16  CREATININE 1.51*  CALCIUM 9.6   GFR Estimated Creatinine Clearance: 47.7 mL/min (A) (by C-G formula based on SCr of 1.51 mg/dL (H)). Liver Function Tests: Recent Labs  Lab 03/30/20 1230  AST 25  ALT 35  ALKPHOS 76  BILITOT 0.9  PROT 7.1  ALBUMIN 4.0   Recent Labs  Lab 03/30/20 1230  LIPASE 41   No results for input(s): AMMONIA in the last 168 hours. Coagulation profile Recent Labs  Lab 03/30/20 1230  INR 1.0   COVID-19 Labs  No results for input(s): DDIMER, FERRITIN, LDH, CRP in the last 72 hours.  Lab Results  Component Value Date   SARSCOV2NAA NEGATIVE 03/30/2020    CBC: Recent Labs  Lab 03/30/20 1230  WBC 12.3*  NEUTROABS 10.2*  HGB 14.8  HCT 44.7  MCV 89.4  PLT 252   Cardiac Enzymes: No results for input(s): CKTOTAL, CKMB, CKMBINDEX, TROPONINI in the last 168 hours. BNP (last 3 results) No results for input(s): PROBNP in the last 8760 hours. CBG: Recent Labs  Lab 03/30/20 1654 03/30/20 1733 03/30/20 2132  03/31/20 0626  GLUCAP 126* 128* 168* 111*   D-Dimer: No results for input(s): DDIMER in the last 72 hours. Hgb A1c: Recent Labs    03/30/20 1837  HGBA1C 6.7*   Lipid Profile: Recent Labs    03/30/20 1837  CHOL 122  HDL 53  LDLCALC 52  TRIG 85  CHOLHDL 2.3   Thyroid function studies: Recent Labs    03/30/20 1837  TSH 3.557   Anemia work up: No results for input(s): VITAMINB12, FOLATE, FERRITIN, TIBC, IRON, RETICCTPCT in the last 72 hours. Sepsis Labs: Recent Labs  Lab 03/30/20 1230  WBC 12.3*   Microbiology Recent Results (from the past 240 hour(s))  SARS Coronavirus 2 by RT PCR (hospital order, performed in [facility] hospital lab) Nasopharyngeal Nasopharyngeal Swab     Status: None   Collection Time: 03/30/20  6:32 PM   Specimen: Nasopharyngeal Swab  Result Value Ref Range Status   SARS Coronavirus 2 NEGATIVE NEGATIVE Final    Comment: (NOTE) SARS-CoV-2 target nucleic acids are NOT  DETECTED.  The SARS-CoV-2 RNA is generally detectable in upper and lower respiratory specimens during the acute phase of infection. The lowest concentration of SARS-CoV-2 viral copies this assay can detect is 250 copies / mL. A negative result does not preclude SARS-CoV-2 infection and should not be used as the sole basis for treatment or other patient management decisions.  A negative result may occur with improper specimen collection / handling, submission of specimen other than nasopharyngeal swab, presence of viral mutation(s) within the areas targeted by this assay, and inadequate number of viral copies (<250 copies / mL). A negative result must be combined with clinical observations, patient history, and epidemiological information.  Fact Sheet for Patients:   BoilerBrush.com.cy  Fact Sheet for Healthcare Providers: https://pope.com/  This test is not yet approved or  cleared by the [place] FDA and has been  authorized for detection and/or diagnosis of SARS-CoV-2 by FDA under an Emergency Use Authorization (EUA).  This EUA will remain in effect (meaning this test can be used) for the duration of the COVID-19 declaration under Section 564(b)(1) of the Act, 21 U.S.C. section 360bbb-3(b)(1), unless the authorization is terminated or revoked sooner.  Performed at [facility], [address] [place], [place] [address]      Medications:   . atorvastatin  40 mg Oral QHS  . clopidogrel  75 mg Oral Daily  . docusate sodium  100 mg Oral BID  . escitalopram  20 mg Oral Daily  . famotidine  20 mg Oral QODAY  . losartan  100 mg Oral Daily   And  . hydrochlorothiazide  25 mg Oral Daily  . insulin aspart  0-15 Units Subcutaneous TID WC  . insulin aspart  0-5 Units Subcutaneous QHS  . isosorbide mononitrate  60 mg Oral Daily  . metoprolol tartrate  25 mg Oral BID  . mometasone-formoterol  2 puff Inhalation BID  . nicotine  14 mg Transdermal Daily  . nitroGLYCERIN  1 inch Topical Q6H  . pantoprazole  40 mg Oral Daily   Continuous Infusions: . diltiazem (CARDIZEM) infusion 5 mg/hr (03/30/20 1828)  . heparin 1,300 Units/hr (03/31/20 0451)      LOS: 1 day   Marinda Elk  [facility]  03/31/2020, 7:26 AM

## 2020-03-31 NOTE — Care Management (Signed)
TOC following for disposition on oral anticoagulation due to new onset a-fib.. Patient currently on Hep gtt without DOAC specified.  Will submit benefit check for DOAC when clarified. Patient has coverage with Medicare that qualifies him for a 30 day free manufacture's coupon for either xaralto or eliquis.

## 2020-03-31 NOTE — Progress Notes (Addendum)
ANTICOAGULATION CONSULT NOTE - Follow Up Consult  Pharmacy Consult for Heparin Indication: atrial fibrillation  Allergies  Allergen Reactions   Fentanyl Other (See Comments)    Behavioral changes   Gabapentin Other (See Comments)    ankle swells   Lisinopril Other (See Comments)    unknown   Lyrica [Pregabalin] Other (See Comments)    unknown   Metformin Diarrhea   Propofol Other (See Comments)    Heart rate dropped   Shellfish Allergy Other (See Comments)    Other Other-Mild skin reaction to shrimp    Patient Measurements: Height: 5\' 9"  (175.3 cm) Weight: 103.1 kg (227 lb 6.4 oz) (scale a ) IBW/kg (Calculated) : 70.7 Heparin Dosing Weight: 93.8 kg  Vital Signs: Temp: 98.5 F (36.9 C) (07/17 0921) Temp Source: Oral (07/17 0921) BP: 131/62 (07/17 0921) Pulse Rate: 66 (07/17 0921)  Labs: Recent Labs    03/30/20 1230 03/30/20 1524 03/30/20 2005 03/31/20 0921  HGB 14.8  --   --  12.4*  HCT 44.7  --   --  37.2*  PLT 252  --   --  247  LABPROT 12.6  --   --   --   INR 1.0  --   --   --   HEPARINUNFRC  --   --  0.91* 0.66  CREATININE 1.51*  --   --   --   TROPONINIHS 17 17  --   --     Estimated Creatinine Clearance: 47.7 mL/min (A) (by C-G formula based on SCr of 1.51 mg/dL (H)).  Assessment: Albert Powell on heparin gtt for new afib. Heparin level therapeutic at 0.66 will continue heparin drip at same rate and recheck HL and CBC in the morning as HL came down appropriately with reduction in drip rate.   Goal of Therapy:  Heparin level 0.3-0.7 units/ml Monitor platelets by anticoagulation protocol: Yes   Plan:  Continue heparin 1300 units/hr  Recheck heparin level and CBC with AM Labs  04/02/20, PharmD PGY1 Acute Care Pharmacy Resident Phone: [phone] 03/31/2020 11:07 AM  Please check [email or web address] for unit specific pharmacy phone numbers.

## 2020-04-01 DIAGNOSIS — I249 Acute ischemic heart disease, unspecified: Secondary | ICD-10-CM

## 2020-04-01 DIAGNOSIS — N289 Disorder of kidney and ureter, unspecified: Secondary | ICD-10-CM

## 2020-04-01 DIAGNOSIS — I25110 Atherosclerotic heart disease of native coronary artery with unstable angina pectoris: Secondary | ICD-10-CM

## 2020-04-01 DIAGNOSIS — N179 Acute kidney failure, unspecified: Secondary | ICD-10-CM

## 2020-04-01 LAB — GLUCOSE, CAPILLARY
Glucose-Capillary: 98 mg/dL (ref 70–99)
Glucose-Capillary: 131 mg/dL — ABNORMAL HIGH (ref 70–99)
Glucose-Capillary: 130 mg/dL — ABNORMAL HIGH (ref 70–99)
Glucose-Capillary: 134 mg/dL — ABNORMAL HIGH (ref 70–99)

## 2020-04-01 LAB — BASIC METABOLIC PANEL
Anion gap: 11 (ref 5–15)
BUN: 24 mg/dL — ABNORMAL HIGH (ref 8–23)
CO2: 30 mmol/L (ref 22–32)
Calcium: 9.5 mg/dL (ref 8.9–10.3)
Chloride: 96 mmol/L — ABNORMAL LOW (ref 98–111)
Creatinine, Ser: 1.91 mg/dL — ABNORMAL HIGH (ref 0.61–1.24)
GFR calc Af Amer: 38 mL/min — ABNORMAL LOW (ref >60)
GFR calc non Af Amer: 33 mL/min — ABNORMAL LOW (ref >60)
Glucose, Bld: 105 mg/dL — ABNORMAL HIGH (ref 70–99)
Potassium: 3.3 mmol/L — ABNORMAL LOW (ref 3.5–5.1)
Sodium: 137 mmol/L (ref 135–145)

## 2020-04-01 LAB — CBC
HCT: 38.2 % — ABNORMAL LOW (ref 39.0–52.0)
Hemoglobin: 12.6 g/dL — ABNORMAL LOW (ref 13.0–17.0)
MCH: 29.6 pg (ref 26.0–34.0)
MCHC: 33 g/dL (ref 30.0–36.0)
MCV: 89.7 fL (ref 80.0–100.0)
Platelets: 223 10*3/uL (ref 150–400)
RBC: 4.26 MIL/uL (ref 4.22–5.81)
RDW: 13.7 % (ref 11.5–15.5)
WBC: 7.3 10*3/uL (ref 4.0–10.5)
nRBC: 0 % (ref 0.0–0.2)

## 2020-04-01 LAB — HEPARIN LEVEL (UNFRACTIONATED)
Heparin Unfractionated: 1.01 IU/mL — ABNORMAL HIGH (ref 0.30–0.70)
Heparin Unfractionated: 0.5 IU/mL (ref 0.30–0.70)

## 2020-04-01 MED ORDER — SODIUM CHLORIDE 0.9 % IV SOLN: INTRAVENOUS | Status: DC

## 2020-04-01 MED ORDER — SODIUM CHLORIDE 0.9 % WEIGHT BASED INFUSION
1.0000 mL/kg/h | INTRAVENOUS | Status: DC
  Administered 2020-04-02 (×2): 1 mL/kg/h via INTRAVENOUS

## 2020-04-01 MED ORDER — HEPARIN (PORCINE) 25000 UT/250ML-% IV SOLN: 1000.0000 [IU]/h | INTRAVENOUS | Status: DC

## 2020-04-01 MED ORDER — SODIUM CHLORIDE 0.9% FLUSH: 3.0000 mL | INTRAVENOUS | Status: DC | PRN

## 2020-04-01 MED ORDER — SODIUM CHLORIDE 0.9 % WEIGHT BASED INFUSION: 3.0000 mL/kg/h | INTRAVENOUS | Status: DC

## 2020-04-01 MED ORDER — HEPARIN (PORCINE) 25000 UT/250ML-% IV SOLN
1000.0000 [IU]/h | INTRAVENOUS | Status: DC
  Administered 2020-04-02: 1000 [IU]/h via INTRAVENOUS
  Filled 2020-04-01: qty 250

## 2020-04-01 MED ORDER — SODIUM CHLORIDE 0.9% FLUSH
3.0000 mL | Freq: Two times a day (BID) | INTRAVENOUS | Status: DC
  Administered 2020-04-02 – 2020-04-03 (×3): 3 mL via INTRAVENOUS

## 2020-04-01 MED ORDER — ASPIRIN 81 MG PO CHEW
81.0000 mg | CHEWABLE_TABLET | ORAL | Status: AC
  Administered 2020-04-02: 81 mg via ORAL
  Filled 2020-04-01: qty 1

## 2020-04-01 MED ORDER — SODIUM CHLORIDE 0.9 % IV SOLN: 250.0000 mL | INTRAVENOUS | Status: DC | PRN

## 2020-04-01 NOTE — Progress Notes (Signed)
ANTICOAGULATION CONSULT NOTE - Follow Up Consult  Pharmacy Consult for Heparin Indication: atrial fibrillation  Allergies  Allergen Reactions  . Fentanyl Other (See Comments)    Behavioral changes  . Gabapentin Other (See Comments)    ankle swells  . Lisinopril Other (See Comments)    unknown  . Lyrica [Pregabalin] Other (See Comments)    unknown  . Metformin Diarrhea  . Propofol Other (See Comments)    Heart rate dropped  . Shellfish Allergy Other (See Comments)    Other Other-Mild skin reaction to shrimp    Patient Measurements: Height: 5\' 9"  (175.3 cm) Weight: 104 kg (229 lb 4.8 oz) (scale a) IBW/kg (Calculated) : 70.7 Heparin Dosing Weight: 93.8 kg  Vital Signs: Temp: 98.1 F (36.7 C) (07/18 0406) Temp Source: Oral (07/18 0406) BP: 158/70 (07/18 0406) Pulse Rate: 69 (07/18 0406)  Labs: Recent Labs    03/30/20 1230 03/30/20 1230 03/30/20 1524 03/30/20 2005 03/31/20 0921 04/01/20 0621  HGB 14.8   < >  --   --  12.4* 12.6*  HCT 44.7  --   --   --  37.2* 38.2*  PLT 252  --   --   --  247 223  LABPROT 12.6  --   --   --   --   --   INR 1.0  --   --   --   --   --   HEPARINUNFRC  --   --   --  0.91* 0.66 1.01*  CREATININE 1.51*  --   --   --   --  1.91*  TROPONINIHS 17  --  17  --   --   --    < > = values in this interval not displayed.    Estimated Creatinine Clearance: 37.9 mL/min (A) (by C-G formula based on SCr of 1.91 mg/dL (H)).  Assessment: 79 YOM on heparin gtt for new afib. Heparin level supratherapeutic at 1.01. Heparin level was drawn from appropriate site. CBC stable, no signs/symptoms of bleeding noted. Will hold heparin drip x1 hour then decrease the rate to 1000 units/hr and recheck 8 hour HL and CBC with morning labs.   Goal of Therapy:  Heparin level 0.3-0.7 units/ml Monitor platelets by anticoagulation protocol: Yes   Plan:  Hold heparin x1 hour, then decrease rate to 1000 units/hr Recheck heparin level in 8 hours and CBC with AM  Labs  04/03/20, PharmD PGY1 Acute Care Pharmacy Resident Phone: [phone] 04/01/2020 7:31 AM  Please check [email or web address] for unit specific pharmacy phone numbers.

## 2020-04-01 NOTE — Progress Notes (Signed)
ANTICOAGULATION CONSULT NOTE - Follow Up Consult  Pharmacy Consult for Heparin Indication: atrial fibrillation  Allergies  Allergen Reactions  . Fentanyl Other (See Comments)    Behavioral changes  . Gabapentin Other (See Comments)    ankle swells  . Lisinopril Other (See Comments)    unknown  . Lyrica [Pregabalin] Other (See Comments)    unknown  . Metformin Diarrhea  . Propofol Other (See Comments)    Heart rate dropped  . Shellfish Allergy Other (See Comments)    Other Other-Mild skin reaction to shrimp    Patient Measurements: Height: 5\' 9"  (175.3 cm) Weight: 104 kg (229 lb 4.8 oz) (scale a) IBW/kg (Calculated) : 70.7 Heparin Dosing Weight: 93.8 kg  Vital Signs: Temp: 97.3 F (36.3 C) (07/18 1212) Temp Source: Oral (07/18 1212) BP: 121/68 (07/18 1212) Pulse Rate: 60 (07/18 1212)  Labs: Recent Labs     0000 03/30/20 1230 03/30/20 1524 03/30/20 2005 03/31/20 0921 04/01/20 0621 04/01/20 1735  HGB   < > 14.8  --   --  12.4* 12.6*  --   HCT  --  44.7  --   --  37.2* 38.2*  --   PLT  --  252  --   --  247 223  --   LABPROT  --  12.6  --   --   --   --   --   INR  --  1.0  --   --   --   --   --   HEPARINUNFRC  --   --   --    < > 0.66 1.01* 0.50  CREATININE  --  1.51*  --   --   --  1.91*  --   TROPONINIHS  --  17 17  --   --   --   --    < > = values in this interval not displayed.    Estimated Creatinine Clearance: 37.9 mL/min (A) (by C-G formula based on SCr of 1.91 mg/dL (H)).  Assessment: 79 YOM on heparin gtt for new afib. -heparin level at goal after holding and decreasing infusion  Goal of Therapy:  Heparin level 0.3-0.7 units/ml Monitor platelets by anticoagulation protocol: Yes   Plan:  -No heparin changes needed -Daily heparin level and CBC  04/03/20, PharmD Clinical Pharmacist **Pharmacist phone directory can now be found on [email or web address] ([email or web address]).  Listed under [facility].

## 2020-04-01 NOTE — Progress Notes (Addendum)
[facility] PROGRESS NOTE    Progress Note  Albert Powell  TKW:[id number] DOB: 11/29/1940 DOA: 03/30/2020 PCP: [name], MD     Brief Narrative:   Albert Powell is an 79 y.o. male past medical history significant for essential hypertension hyperlipidemia CAD and chest pain which developed nausea vomiting and sweating profusely while walking to the car, later developed substernal chest pain and jaw pain, EMS was called she was found in A. fib.  The ED was found to be in new onset A. fib with RVR started on Lopressor and heparin cardiac biomarkers negative.  Assessment/Plan:   New onset atrial fibrillation [facility]): Off diltiazem drip, on metoprolol and IV heparin cardiac biomarkers negative. 2D echo no wall motion abnormality grade 1 diastolic heart failure. Continue current medications, for cardiac cath on 04/02/2020. To transition to oral anticoagulation after ischemic work-up is performed. DCCV in 4 weeks as an outpatient.  CAD hx of 2 stents to RCA in 2003,  80-90% stenosis of LAD/CFX June 2014PAD: Continue Plavix and Imdur. Patient with significant history of CAD for cardiac cath probably Monday.  Hypertensive urgency: Mild rise in cr. Hold hyzaar Blood pressure is stable at this time.  Hyperlipidemia: Continue Lipitor.  Diabetes mellitus type 2 with peripheral vascular disease: With an A1c of 6.7 continue long-acting insulin plus sliding scale.  Tobacco dependence: Counseling.  Obesity: Counseling  DVT prophylaxis: heparin Family Communication:none Status is: Observation  The patient remains OBS appropriate and will d/c before 2 midnights.  Dispo: The patient is from: Home              Anticipated d/c is to: Home              Anticipated d/c date is: 3 days              Patient currently is not medically stable to d/c.        Code Status:     Code Status Orders  (From admission, onward)         Start     Ordered   03/30/20  1533  Full code  Continuous        03/30/20 1535        Code Status History    Date Active Date Inactive Code Status Order ID Comments User Context   03/30/2018 1202 03/31/2018 1439 Full Code [id number]  Nada Libman, MD Inpatient   06/02/2013 1252 06/03/2013 1334 Full Code [id number]  Garth Schlatter Inpatient   02/04/2013 2243 02/08/2013 1241 Full Code [id number]  Eduard Clos, MD Inpatient   Advance Care Planning Activity        IV Access:    Peripheral IV   Procedures and diagnostic studies:   CT HEAD WO CONTRAST  Result Date: 03/30/2020 CLINICAL DATA:  Transient ischemic attack (TIA). Additional provided: New onset atrial fibrillation, EXAM: CT HEAD WITHOUT CONTRAST TECHNIQUE: Contiguous axial images were obtained from the base of the skull through the vertex without intravenous contrast. COMPARISON:  Head CT 03/27/2017 FINDINGS: Brain: Stable, moderate generalized parenchymal atrophy. Stable, mild ill-defined hypoattenuation within the cerebral white matter is nonspecific, but consistent with chronic small vessel ischemic disease. Redemonstrated small chronic lacunar infarct within the right caudate nucleus (series 2, image 21). There is no acute intracranial hemorrhage. No demarcated cortical infarct is identified. No extra-axial fluid collection. No evidence of intracranial mass. No midline shift. Vascular: No hyperdense vessel.  Atherosclerotic calcifications. Skull: Normal. Negative for fracture or focal  lesion. Sinuses/Orbits: Visualized orbits show no acute finding. Mild ethmoid sinus mucosal thickening. Small amount of frothy secretions within the right maxillary sinus. No significant mastoid effusion. IMPRESSION: No CT evidence of acute intracranial abnormality. Moderate generalized parenchymal atrophy and mild chronic small vessel ischemic disease, stable as compared to the head CT of 03/27/2017. Redemonstrated small chronic right basal ganglia lacunar infarct.  Paranasal sinus disease as described. Electronically Signed   By: Jackey Loge DO   On: 03/30/2020 18:55   DG Chest Port 1 View  Result Date: 03/30/2020 CLINICAL DATA:  Chest pain. EXAM: PORTABLE CHEST 1 VIEW COMPARISON:  May 31, 2013. FINDINGS: Stable cardiomediastinal silhouette. No pneumothorax or pleural effusion is noted. No acute pulmonary disease is noted. Bony thorax is unremarkable. IMPRESSION: No active disease. Aortic Atherosclerosis (ICD10-I70.0). Electronically Signed   By: Lupita Raider M.D.   On: 03/30/2020 13:58   ECHOCARDIOGRAM COMPLETE  Result Date: 03/30/2020    ECHOCARDIOGRAM REPORT   Patient Name:   Albert Powell Date of Exam: 03/30/2020 Medical Rec #:  [id number]        Height:       69.0 in Accession #:    [id number]       Weight:       235.0 lb Date of Birth:  21-May-1941        BSA:          2.213 m Patient Age:    79 years         BP:           156/95 mmHg Patient Gender: M                HR:           105 bpm. Exam Location:  Inpatient Procedure: 2D Echo, Cardiac Doppler and Color Doppler Indications:    R07.9* Chest pain, unspecified  History:        Patient has no prior history of Echocardiogram examinations.                 CAD, Carotid Disease and COPD, Arrythmias:Atrial Fibrillation;                 Risk Factors:Hypertension, Diabetes, Dyslipidemia and Former                 Smoker.  Sonographer:    Elmarie Shiley Dance Referring Phys: [phone] JILL D MCDANIEL IMPRESSIONS  1. Left ventricular ejection fraction, by estimation, is 55 to 60%. The left ventricle has normal function. The left ventricle has no regional wall motion abnormalities. Left ventricular diastolic parameters are consistent with Grade I diastolic dysfunction (impaired relaxation).  2. Right ventricular systolic function is normal. The right ventricular size is normal.  3. The mitral valve is normal in structure. No evidence of mitral valve regurgitation. No evidence of mitral stenosis.  4. The aortic valve is  tricuspid. Aortic valve regurgitation is not visualized. Mild aortic valve sclerosis is present, with no evidence of aortic valve stenosis.  5. The inferior vena cava is normal in size with greater than 50% respiratory variability, suggesting right atrial pressure of 3 mmHg. FINDINGS  Left Ventricle: Left ventricular ejection fraction, by estimation, is 55 to 60%. The left ventricle has normal function. The left ventricle has no regional wall motion abnormalities. The left ventricular internal cavity size was normal in size. There is  no left ventricular hypertrophy. Left ventricular diastolic parameters are consistent with Grade I diastolic dysfunction (impaired relaxation). Right  Ventricle: The right ventricular size is normal. No increase in right ventricular wall thickness. Right ventricular systolic function is normal. Left Atrium: Left atrial size was normal in size. Right Atrium: Right atrial size was normal in size. Pericardium: There is no evidence of pericardial effusion. Mitral Valve: The mitral valve is normal in structure. There is mild calcification of the mitral valve leaflet(s). Normal mobility of the mitral valve leaflets. Mild mitral annular calcification. No evidence of mitral valve regurgitation. No evidence of mitral valve stenosis. Tricuspid Valve: The tricuspid valve is normal in structure. Tricuspid valve regurgitation is not demonstrated. No evidence of tricuspid stenosis. Aortic Valve: The aortic valve is tricuspid. Aortic valve regurgitation is not visualized. Mild aortic valve sclerosis is present, with no evidence of aortic valve stenosis. Pulmonic Valve: The pulmonic valve was normal in structure. Pulmonic valve regurgitation is not visualized. No evidence of pulmonic stenosis. Aorta: The aortic root is normal in size and structure. Venous: The inferior vena cava is normal in size with greater than 50% respiratory variability, suggesting right atrial pressure of 3 mmHg. IAS/Shunts: No  atrial level shunt detected by color flow Doppler.  LEFT VENTRICLE PLAX 2D LVIDd:         4.20 cm LVIDs:         3.60 cm LV PW:         1.00 cm LV IVS:        1.00 cm LVOT diam:     2.30 cm LV SV:         69 LV SV Index:   31 LVOT Area:     4.15 cm  RIGHT VENTRICLE          IVC RV Basal diam:  2.00 cm  IVC diam: 1.30 cm TAPSE (M-mode): 1.5 cm LEFT ATRIUM             Index       RIGHT ATRIUM           Index LA diam:        3.60 cm 1.63 cm/m  RA Area:     15.40 cm LA Vol (A2C):   63.4 ml 28.65 ml/m RA Volume:   29.90 ml  13.51 ml/m LA Vol (A4C):   35.9 ml 16.23 ml/m LA Biplane Vol: 50.7 ml 22.91 ml/m  AORTIC VALVE LVOT Vmax:   96.30 cm/s LVOT Vmean:  59.800 cm/s LVOT VTI:    0.167 m  AORTA Ao Root diam: 3.70 cm Ao Asc diam:  3.30 cm MITRAL VALVE MV Area (PHT): 2.73 cm     SHUNTS MV Decel Time: 278 msec     Systemic VTI:  0.17 m MV E velocity: 71.60 cm/s   Systemic Diam: 2.30 cm MV A velocity: 110.00 cm/s MV E/A ratio:  0.65 Donato Schultz MD Electronically signed by Donato Schultz MD Signature Date/Time: 03/30/2020/4:31:02 PM    Final      Medical Consultants:    None.  Anti-Infectives:   none  Subjective:    Albert Powell is currently chest pain-free asymptomatic tolerating his diet.  Objective:    Vitals:   03/31/20 1647 03/31/20 1952 04/01/20 0406 04/01/20 0820  BP: 139/62 (!) 137/59 (!) 158/70   Pulse: (!) 56 (!) 57 69   Resp: 18 18 18    Temp: 98.2 F (36.8 C) 98.3 F (36.8 C) 98.1 F (36.7 C)   TempSrc: Oral Oral Oral   SpO2: 98% 98% 98% 96%  Weight:   104 kg  Height:       SpO2: 96 %   Intake/Output Summary (Last 24 hours) at 04/01/2020 0904 Last data filed at 04/01/2020 0602 Gross per 24 hour  Intake 640 ml  Output 400 ml  Net 240 ml   Filed Weights   03/30/20 1233 03/31/20 0449 04/01/20 0406  Weight: 106.6 kg 103.1 kg 104 kg    Exam: General exam: In no acute distress. Respiratory system: Good air movement and clear to auscultation. Cardiovascular system:  S1 & S2 heard, RRR. No JVD. Gastrointestinal system: Abdomen is nondistended, soft and nontender.  Extremities: No pedal edema. Skin: No rashes, lesions or ulcers Psychiatry: Judgement and insight appear normal. Mood & affect appropriate.   Data Reviewed:    Labs: Basic Metabolic Panel: Recent Labs  Lab 03/30/20 1230 04/01/20 0621  NA 137 137  K 3.8 3.3*  CL 97* 96*  CO2 23 30  GLUCOSE 179* 105*  BUN 16 24*  CREATININE 1.51* 1.91*  CALCIUM 9.6 9.5   GFR Estimated Creatinine Clearance: 37.9 mL/min (A) (by C-G formula based on SCr of 1.91 mg/dL (H)). Liver Function Tests: Recent Labs  Lab 03/30/20 1230  AST 25  ALT 35  ALKPHOS 76  BILITOT 0.9  PROT 7.1  ALBUMIN 4.0   Recent Labs  Lab 03/30/20 1230  LIPASE 41   No results for input(s): AMMONIA in the last 168 hours. Coagulation profile Recent Labs  Lab 03/30/20 1230  INR 1.0   COVID-19 Labs  No results for input(s): DDIMER, FERRITIN, LDH, CRP in the last 72 hours.  Lab Results  Component Value Date   SARSCOV2NAA NEGATIVE 03/30/2020    CBC: Recent Labs  Lab 03/30/20 1230 03/31/20 0921 04/01/20 0621  WBC 12.3* 9.7 7.3  NEUTROABS 10.2*  --   --   HGB 14.8 12.4* 12.6*  HCT 44.7 37.2* 38.2*  MCV 89.4 91.0 89.7  PLT 252 247 223   Cardiac Enzymes: No results for input(s): CKTOTAL, CKMB, CKMBINDEX, TROPONINI in the last 168 hours. BNP (last 3 results) No results for input(s): PROBNP in the last 8760 hours. CBG: Recent Labs  Lab 03/31/20 0626 03/31/20 1124 03/31/20 1612 03/31/20 2109 04/01/20 0601  GLUCAP 111* 131* 129* 93 98   D-Dimer: No results for input(s): DDIMER in the last 72 hours. Hgb A1c: Recent Labs    03/30/20 1837  HGBA1C 6.7*   Lipid Profile: Recent Labs    03/30/20 1837  CHOL 122  HDL 53  LDLCALC 52  TRIG 85  CHOLHDL 2.3   Thyroid function studies: Recent Labs    03/30/20 1837  TSH 3.557   Anemia work up: No results for input(s): VITAMINB12, FOLATE,  FERRITIN, TIBC, IRON, RETICCTPCT in the last 72 hours. Sepsis Labs: Recent Labs  Lab 03/30/20 1230 03/31/20 0921 04/01/20 0621  WBC 12.3* 9.7 7.3   Microbiology Recent Results (from the past 240 hour(s))  SARS Coronavirus 2 by RT PCR (hospital order, performed in [facility] hospital lab) Nasopharyngeal Nasopharyngeal Swab     Status: None   Collection Time: 03/30/20  6:32 PM   Specimen: Nasopharyngeal Swab  Result Value Ref Range Status   SARS Coronavirus 2 NEGATIVE NEGATIVE Final    Comment: (NOTE) SARS-CoV-2 target nucleic acids are NOT DETECTED.  The SARS-CoV-2 RNA is generally detectable in upper and lower respiratory specimens during the acute phase of infection. The lowest concentration of SARS-CoV-2 viral copies this assay can detect is 250 copies / mL. A negative result does not preclude  SARS-CoV-2 infection and should not be used as the sole basis for treatment or other patient management decisions.  A negative result may occur with improper specimen collection / handling, submission of specimen other than nasopharyngeal swab, presence of viral mutation(s) within the areas targeted by this assay, and inadequate number of viral copies (<250 copies / mL). A negative result must be combined with clinical observations, patient history, and epidemiological information.  Fact Sheet for Patients:   BoilerBrush.com.cy  Fact Sheet for Healthcare Providers: https://pope.com/  This test is not yet approved or  cleared by the [place] FDA and has been authorized for detection and/or diagnosis of SARS-CoV-2 by FDA under an Emergency Use Authorization (EUA).  This EUA will remain in effect (meaning this test can be used) for the duration of the COVID-19 declaration under Section 564(b)(1) of the Act, 21 U.S.C. section 360bbb-3(b)(1), unless the authorization is terminated or revoked sooner.  Performed at [facility]  Lab, [address] [place], [place] [address]      Medications:   . atorvastatin  40 mg Oral QHS  . clopidogrel  75 mg Oral Daily  . docusate sodium  100 mg Oral BID  . escitalopram  20 mg Oral Daily  . famotidine  20 mg Oral QODAY  . losartan  100 mg Oral Daily   And  . hydrochlorothiazide  25 mg Oral Daily  . insulin aspart  0-15 Units Subcutaneous TID WC  . insulin aspart  0-5 Units Subcutaneous QHS  . isosorbide mononitrate  60 mg Oral Daily  . metoprolol tartrate  25 mg Oral BID  . mometasone-formoterol  2 puff Inhalation BID  . nicotine  14 mg Transdermal Daily  . nitroGLYCERIN  1 inch Topical Q6H  . pantoprazole  40 mg Oral Daily   Continuous Infusions: . heparin        LOS: 2 days   Marinda Elk  [facility]  04/01/2020, 9:04 AM

## 2020-04-01 NOTE — Progress Notes (Signed)
Progress Note  Patient Name: Albert Powell Date of Encounter: 04/01/2020  [facility] Cardiologist: Nanetta Batty, MD   Subjective   Feels well today. Denies any chest pain, palpitations, HA.   Inpatient Medications    Scheduled Meds:  atorvastatin  40 mg Oral QHS   clopidogrel  75 mg Oral Daily   docusate sodium  100 mg Oral BID   escitalopram  20 mg Oral Daily   famotidine  20 mg Oral QODAY   insulin aspart  0-15 Units Subcutaneous TID WC   insulin aspart  0-5 Units Subcutaneous QHS   isosorbide mononitrate  60 mg Oral Daily   metoprolol tartrate  25 mg Oral BID   mometasone-formoterol  2 puff Inhalation BID   nicotine  14 mg Transdermal Daily   nitroGLYCERIN  1 inch Topical Q6H   pantoprazole  40 mg Oral Daily   Continuous Infusions:  heparin 1,000 Units/hr (04/01/20 0922)   PRN Meds: acetaminophen **OR** acetaminophen, bisacodyl, hydrALAZINE, HYDROcodone-acetaminophen, metoprolol tartrate, morphine injection, ondansetron **OR** ondansetron (ZOFRAN) IV, polyethylene glycol, polyvinyl alcohol, zolpidem   Vital Signs    Vitals:   03/31/20 1952 04/01/20 0406 04/01/20 0820 04/01/20 0917  BP: (!) 137/59 (!) 158/70  (!) 148/72  Pulse: (!) 57 69  68  Resp: 18 18    Temp: 98.3 F (36.8 C) 98.1 F (36.7 C)    TempSrc: Oral Oral    SpO2: 98% 98% 96%   Weight:  104 kg    Height:        Intake/Output Summary (Last 24 hours) at 04/01/2020 1033 Last data filed at 04/01/2020 0602 Gross per 24 hour  Intake 640 ml  Output 400 ml  Net 240 ml   Last 3 Weights 04/01/2020 03/31/2020 03/30/2020  Weight (lbs) 229 lb 4.8 oz 227 lb 6.4 oz 235 lb  Weight (kg) 104.01 kg 103.148 kg 106.595 kg      Telemetry    NSR- Personally Reviewed  ECG    NSR, old anterior infarct, LAD- Personally Reviewed  Physical Exam   GEN: obese WM in No acute distress.   Neck: No JVD Cardiac: RRR, no murmurs, rubs, or gallops.  Respiratory: Clear to auscultation  bilaterally. GI: Soft, nontender, non-distended  MS: No edema; No deformity. Neuro:  Nonfocal  Psych: Normal affect   Labs    High Sensitivity Troponin:   Recent Labs  Lab 03/30/20 1230 03/30/20 1524  TROPONINIHS 17 17      Chemistry Recent Labs  Lab 03/30/20 1230 04/01/20 0621  NA 137 137  K 3.8 3.3*  CL 97* 96*  CO2 23 30  GLUCOSE 179* 105*  BUN 16 24*  CREATININE 1.51* 1.91*  CALCIUM 9.6 9.5  PROT 7.1  --   ALBUMIN 4.0  --   AST 25  --   ALT 35  --   ALKPHOS 76  --   BILITOT 0.9  --   GFRNONAA 44* 33*  GFRAA 51* 38*  ANIONGAP 17* 11     Hematology Recent Labs  Lab 03/30/20 1230 03/31/20 0921 04/01/20 0621  WBC 12.3* 9.7 7.3  RBC 5.00 4.09* 4.26  HGB 14.8 12.4* 12.6*  HCT 44.7 37.2* 38.2*  MCV 89.4 91.0 89.7  MCH 29.6 30.3 29.6  MCHC 33.1 33.3 33.0  RDW 13.5 13.7 13.7  PLT 252 247 223    BNP Recent Labs  Lab 03/30/20 1251  BNP 199.3*     DDimer No results for input(s): DDIMER in the  last 168 hours.   Radiology    CT HEAD WO CONTRAST  Result Date: 03/30/2020 CLINICAL DATA:  Transient ischemic attack (TIA). Additional provided: New onset atrial fibrillation, EXAM: CT HEAD WITHOUT CONTRAST TECHNIQUE: Contiguous axial images were obtained from the base of the skull through the vertex without intravenous contrast. COMPARISON:  Head CT 03/27/2017 FINDINGS: Brain: Stable, moderate generalized parenchymal atrophy. Stable, mild ill-defined hypoattenuation within the cerebral white matter is nonspecific, but consistent with chronic small vessel ischemic disease. Redemonstrated small chronic lacunar infarct within the right caudate nucleus (series 2, image 21). There is no acute intracranial hemorrhage. No demarcated cortical infarct is identified. No extra-axial fluid collection. No evidence of intracranial mass. No midline shift. Vascular: No hyperdense vessel.  Atherosclerotic calcifications. Skull: Normal. Negative for fracture or focal lesion.  Sinuses/Orbits: Visualized orbits show no acute finding. Mild ethmoid sinus mucosal thickening. Small amount of frothy secretions within the right maxillary sinus. No significant mastoid effusion. IMPRESSION: No CT evidence of acute intracranial abnormality. Moderate generalized parenchymal atrophy and mild chronic small vessel ischemic disease, stable as compared to the head CT of 03/27/2017. Redemonstrated small chronic right basal ganglia lacunar infarct. Paranasal sinus disease as described. Electronically Signed   By: Jackey Loge DO   On: 03/30/2020 18:55   DG Chest Port 1 View  Result Date: 03/30/2020 CLINICAL DATA:  Chest pain. EXAM: PORTABLE CHEST 1 VIEW COMPARISON:  May 31, 2013. FINDINGS: Stable cardiomediastinal silhouette. No pneumothorax or pleural effusion is noted. No acute pulmonary disease is noted. Bony thorax is unremarkable. IMPRESSION: No active disease. Aortic Atherosclerosis (ICD10-I70.0). Electronically Signed   By: Lupita Raider M.D.   On: 03/30/2020 13:58   ECHOCARDIOGRAM COMPLETE  Result Date: 03/30/2020    ECHOCARDIOGRAM REPORT   Patient Name:   Albert Powell Date of Exam: 03/30/2020 Medical Rec #:  [id number]        Height:       69.0 in Accession #:    [id number]       Weight:       235.0 lb Date of Birth:  November 17, 1940        BSA:          2.213 m Patient Age:    79 years         BP:           156/95 mmHg Patient Gender: M                HR:           105 bpm. Exam Location:  Inpatient Procedure: 2D Echo, Cardiac Doppler and Color Doppler Indications:    R07.9* Chest pain, unspecified  History:        Patient has no prior history of Echocardiogram examinations.                 CAD, Carotid Disease and COPD, Arrythmias:Atrial Fibrillation;                 Risk Factors:Hypertension, Diabetes, Dyslipidemia and Former                 Smoker.  Sonographer:    Elmarie Shiley Dance Referring Phys: [id number] JILL D MCDANIEL IMPRESSIONS  1. Left ventricular ejection fraction, by  estimation, is 55 to 60%. The left ventricle has normal function. The left ventricle has no regional wall motion abnormalities. Left ventricular diastolic parameters are consistent with Grade I diastolic dysfunction (impaired relaxation).  2. Right ventricular systolic function is  normal. The right ventricular size is normal.  3. The mitral valve is normal in structure. No evidence of mitral valve regurgitation. No evidence of mitral stenosis.  4. The aortic valve is tricuspid. Aortic valve regurgitation is not visualized. Mild aortic valve sclerosis is present, with no evidence of aortic valve stenosis.  5. The inferior vena cava is normal in size with greater than 50% respiratory variability, suggesting right atrial pressure of 3 mmHg. FINDINGS  Left Ventricle: Left ventricular ejection fraction, by estimation, is 55 to 60%. The left ventricle has normal function. The left ventricle has no regional wall motion abnormalities. The left ventricular internal cavity size was normal in size. There is  no left ventricular hypertrophy. Left ventricular diastolic parameters are consistent with Grade I diastolic dysfunction (impaired relaxation). Right Ventricle: The right ventricular size is normal. No increase in right ventricular wall thickness. Right ventricular systolic function is normal. Left Atrium: Left atrial size was normal in size. Right Atrium: Right atrial size was normal in size. Pericardium: There is no evidence of pericardial effusion. Mitral Valve: The mitral valve is normal in structure. There is mild calcification of the mitral valve leaflet(s). Normal mobility of the mitral valve leaflets. Mild mitral annular calcification. No evidence of mitral valve regurgitation. No evidence of mitral valve stenosis. Tricuspid Valve: The tricuspid valve is normal in structure. Tricuspid valve regurgitation is not demonstrated. No evidence of tricuspid stenosis. Aortic Valve: The aortic valve is tricuspid. Aortic  valve regurgitation is not visualized. Mild aortic valve sclerosis is present, with no evidence of aortic valve stenosis. Pulmonic Valve: The pulmonic valve was normal in structure. Pulmonic valve regurgitation is not visualized. No evidence of pulmonic stenosis. Aorta: The aortic root is normal in size and structure. Venous: The inferior vena cava is normal in size with greater than 50% respiratory variability, suggesting right atrial pressure of 3 mmHg. IAS/Shunts: No atrial level shunt detected by color flow Doppler.  LEFT VENTRICLE PLAX 2D LVIDd:         4.20 cm LVIDs:         3.60 cm LV PW:         1.00 cm LV IVS:        1.00 cm LVOT diam:     2.30 cm LV SV:         69 LV SV Index:   31 LVOT Area:     4.15 cm  RIGHT VENTRICLE          IVC RV Basal diam:  2.00 cm  IVC diam: 1.30 cm TAPSE (M-mode): 1.5 cm LEFT ATRIUM             Index       RIGHT ATRIUM           Index LA diam:        3.60 cm 1.63 cm/m  RA Area:     15.40 cm LA Vol (A2C):   63.4 ml 28.65 ml/m RA Volume:   29.90 ml  13.51 ml/m LA Vol (A4C):   35.9 ml 16.23 ml/m LA Biplane Vol: 50.7 ml 22.91 ml/m  AORTIC VALVE LVOT Vmax:   96.30 cm/s LVOT Vmean:  59.800 cm/s LVOT VTI:    0.167 m  AORTA Ao Root diam: 3.70 cm Ao Asc diam:  3.30 cm MITRAL VALVE MV Area (PHT): 2.73 cm     SHUNTS MV Decel Time: 278 msec     Systemic VTI:  0.17 m MV E velocity: 71.60 cm/s  Systemic Diam: 2.30 cm MV A velocity: 110.00 cm/s MV E/A ratio:  0.65 Donato Schultz MD Electronically signed by Donato Schultz MD Signature Date/Time: 03/30/2020/4:31:02 PM    Final     Cardiac Studies   LHC 02/08/2013:  ANGIOGRAPHIC RESULTS:   1. Left main; normal  2. LAD; 80-90% focal distal and a 1.5-1.7 mm vessel 3. Left circumflex; nondominant with a 90% AV groove circumflex stenosis and a small vessel. There was a moderate to large obtuse marginal branch that had a 60% proximal segmental calcified stenosis.[name]  4. Right coronary artery; dominant with anterior takeoff, widely patent  stents with no other significant disease 5. Left ventriculography; RAO left ventriculogram was performed using  25 mL of Visipaque dye at 12 mL/second. The overall LVEF estimated  60 % Without wall motion abnormalities  IMPRESSION:Mr. Saladin has a widely patent stent in the dominant RCA with high-grade disease in his apical LAD and mid AV groove circumflex which are small vessels. He does have moderate disease in a large first obtuse marginal branch with normal LV function. At this point I'm going to treat him medically and increase his Imdur from 30-60 mg a day. If he has recalcitrant symptoms all consider intervention on his LAD was a moderate circumflex and obtuse marginal branches. The sheath was removed and A TR band was placed on the right wrist to achieve patent hemostasis. The patient left the Cath Lab in stable condition. He'll be gently hydrated for 2 hours and discharged from with close followup in the office by a mid-level provider.  Patient Profile     79 y.o. male with a hx of CAD s/p stenting x2 to RCA in 2003 with residual LAD disease, carotid artery disease s/p carotid artery stenosis s/p LICA endarterectomy 04/2013, peripheral arterial disease s/p right external iliac stenting 10/2013, hypertension, hyperlipidemia, and DM2 who is being seen today for the evaluation of atrial fibrillation with RVR at the request of Dr. Donnald Garre.  Assessment & Plan    1. New onset atrial fibrillation: -Pt presented to [facility] 03/30/2020 after acute onset of nausea and vomiting followed by chest discomfort found to be in new onset atrial fibrillation on EKG per EMS. -IV heparin initiated on presentation out of concern for ACS/AF given history of CAD, carotid artery disease and peripheral arterial disease. -Telemetry shows conversion to NSR -on metoprolol 25 mg p.o. BID -Plan for AF will likely be transition to oral anticoagulant after ACS event ruled out - following cardiac cath -Echo unremarkable  with normal EF  2.  Recent severe headache: -CT negative for acute CVA/bleed -Neuro exam normal - HA resolved.   3.  Hypertensive urgency: -SBP on presentation 174/69 up to 200 -now normotensive  4.  History of CAD s/p PCI x2 to RCA in 2003 with residual LAD/LCx disease per cath 2014; w/ chest pain: -As above, patient has significant history of CAD, carotid artery disease and peripheral arterial disease -Had an episode of chest tightness at symptom onset with unclear etiology.  Symptoms may be secondary to atrial fibrillation with elevated rates however given the above, cannot completely rule out coronary event.   -Denies chest pain -Continue IV heparin -hsT, 17 with pending repeat -EKG with no ischemic changes -Plan Cardiac catheterization on Mondayprovided renal function is better.   5.  HLD: -Last LDL, 57 on 12/25/2017 -Continue high intensity atorvastatin  6.  Carotid arterial disease: -Followed by Dr. [name] s/p endarterectomy secondary to severe proximal left internal carotid artery stenosis  06/02/2013 -Continue Plavix  7.  Peripheral arterial disease: -Underwent diamondback orbital rotational arthrectomy with PTA and stenting to the right external iliac artery 10/20/2013 with ultimate bilateral iliac stenting per Dr. [name] 03/30/2018 with renal artery stenting -Continue Plavix  8.  Acute kidney injury: -Baseline creatinine appears to be in the 1.0 range however levels are from 10/2013 and before -Presenting creatinine, 1.51. 1.91 today. ? Related to severe HTN on admission. Will hold losartan HCT. Hydrate overnight for cardiac cath. -Avoid nephrotoxic medications -Will IV fluid hydrate prior to [facility] -Daily BMET     For questions or updates, please contact [facility] Please consult [email or web address] for contact info under        Signed, Rustyn Conery [place], MD  04/01/2020, 10:33 AM

## 2020-04-01 NOTE — Progress Notes (Signed)
RN rounded on pt. Pt is asleep. 

## 2020-04-02 ENCOUNTER — Encounter (HOSPITAL_COMMUNITY): Payer: Self-pay | Admitting: Cardiovascular Disease

## 2020-04-02 ENCOUNTER — Encounter (HOSPITAL_COMMUNITY)
Admission: EM | Disposition: A | Discharge status: Home / Self Care | Payer: Self-pay | Source: Home / Self Care | Attending: Internal Medicine

## 2020-04-02 DIAGNOSIS — I10 Essential (primary) hypertension: Secondary | ICD-10-CM

## 2020-04-02 HISTORY — PX: LEFT HEART CATH AND CORONARY ANGIOGRAPHY: CATH118249

## 2020-04-02 HISTORY — PX: INTRAVASCULAR PRESSURE WIRE/FFR STUDY: CATH118243

## 2020-04-02 LAB — GLUCOSE, CAPILLARY
Glucose-Capillary: 95 mg/dL (ref 70–99)
Glucose-Capillary: 92 mg/dL (ref 70–99)
Glucose-Capillary: 117 mg/dL — ABNORMAL HIGH (ref 70–99)
Glucose-Capillary: 134 mg/dL — ABNORMAL HIGH (ref 70–99)

## 2020-04-02 LAB — CBC
HCT: 33.7 % — ABNORMAL LOW (ref 39.0–52.0)
Hemoglobin: 11.1 g/dL — ABNORMAL LOW (ref 13.0–17.0)
MCH: 29.6 pg (ref 26.0–34.0)
MCHC: 32.9 g/dL (ref 30.0–36.0)
MCV: 89.9 fL (ref 80.0–100.0)
Platelets: 191 10*3/uL (ref 150–400)
RBC: 3.75 MIL/uL — ABNORMAL LOW (ref 4.22–5.81)
RDW: 13.6 % (ref 11.5–15.5)
WBC: 7.1 10*3/uL (ref 4.0–10.5)
nRBC: 0 % (ref 0.0–0.2)

## 2020-04-02 LAB — BASIC METABOLIC PANEL
Anion gap: 9 (ref 5–15)
BUN: 24 mg/dL — ABNORMAL HIGH (ref 8–23)
CO2: 27 mmol/L (ref 22–32)
Calcium: 8.7 mg/dL — ABNORMAL LOW (ref 8.9–10.3)
Chloride: 101 mmol/L (ref 98–111)
Creatinine, Ser: 1.66 mg/dL — ABNORMAL HIGH (ref 0.61–1.24)
GFR calc Af Amer: 45 mL/min — ABNORMAL LOW (ref >60)
GFR calc non Af Amer: 39 mL/min — ABNORMAL LOW (ref >60)
Glucose, Bld: 109 mg/dL — ABNORMAL HIGH (ref 70–99)
Potassium: 2.9 mmol/L — ABNORMAL LOW (ref 3.5–5.1)
Sodium: 137 mmol/L (ref 135–145)

## 2020-04-02 LAB — POCT ACTIVATED CLOTTING TIME: Activated Clotting Time: 257 seconds

## 2020-04-02 LAB — HEPARIN LEVEL (UNFRACTIONATED): Heparin Unfractionated: 0.46 IU/mL (ref 0.30–0.70)

## 2020-04-02 SURGERY — LEFT HEART CATH AND CORONARY ANGIOGRAPHY
Anesthesia: LOCAL

## 2020-04-02 MED ORDER — SODIUM CHLORIDE 0.9 % IV SOLN: INTRAVENOUS | Status: AC

## 2020-04-02 MED ORDER — FENTANYL CITRATE (PF) 100 MCG/2ML IJ SOLN
INTRAMUSCULAR | Status: AC
  Filled 2020-04-02: qty 2

## 2020-04-02 MED ORDER — VERAPAMIL HCL 2.5 MG/ML IV SOLN
INTRA_ARTERIAL | Status: DC | PRN
  Administered 2020-04-02: 5 mL via INTRA_ARTERIAL
  Administered 2020-04-02: 7 mL via INTRA_ARTERIAL

## 2020-04-02 MED ORDER — VERAPAMIL HCL 2.5 MG/ML IV SOLN
INTRAVENOUS | Status: AC
  Filled 2020-04-02: qty 2

## 2020-04-02 MED ORDER — SODIUM CHLORIDE 0.9 % IV SOLN: 250.0000 mL | INTRAVENOUS | Status: DC | PRN

## 2020-04-02 MED ORDER — MIDAZOLAM HCL 2 MG/2ML IJ SOLN
INTRAMUSCULAR | Status: DC | PRN
  Administered 2020-04-02: 1 mg via INTRAVENOUS

## 2020-04-02 MED ORDER — ONDANSETRON HCL 4 MG/2ML IJ SOLN: 4.0000 mg | Freq: Four times a day (QID) | INTRAMUSCULAR | Status: DC | PRN

## 2020-04-02 MED ORDER — NITROGLYCERIN 1 MG/10 ML FOR IR/CATH LAB
INTRA_ARTERIAL | Status: AC
  Filled 2020-04-02: qty 10

## 2020-04-02 MED ORDER — CLOPIDOGREL BISULFATE 75 MG PO TABS
75.0000 mg | ORAL_TABLET | Freq: Every day | ORAL | Status: DC
  Filled 2020-04-02: qty 1

## 2020-04-02 MED ORDER — HYDRALAZINE HCL 20 MG/ML IJ SOLN: 10.0000 mg | INTRAMUSCULAR | Status: AC | PRN

## 2020-04-02 MED ORDER — IOHEXOL 350 MG/ML SOLN
INTRAVENOUS | Status: DC | PRN
  Administered 2020-04-02: 55 mL

## 2020-04-02 MED ORDER — LIDOCAINE HCL (PF) 1 % IJ SOLN
INTRAMUSCULAR | Status: AC
  Filled 2020-04-02: qty 30

## 2020-04-02 MED ORDER — ISOSORBIDE MONONITRATE ER 60 MG PO TB24
90.0000 mg | ORAL_TABLET | Freq: Every day | ORAL | Status: DC
  Administered 2020-04-03: 90 mg via ORAL
  Filled 2020-04-02: qty 1

## 2020-04-02 MED ORDER — HEPARIN (PORCINE) IN NACL 1000-0.9 UT/500ML-% IV SOLN
INTRAVENOUS | Status: AC
  Filled 2020-04-02: qty 500

## 2020-04-02 MED ORDER — SODIUM CHLORIDE 0.9% FLUSH
3.0000 mL | Freq: Two times a day (BID) | INTRAVENOUS | Status: DC
  Administered 2020-04-02 – 2020-04-03 (×2): 3 mL via INTRAVENOUS

## 2020-04-02 MED ORDER — LIDOCAINE HCL (PF) 1 % IJ SOLN
INTRAMUSCULAR | Status: DC | PRN
  Administered 2020-04-02: 2 mL

## 2020-04-02 MED ORDER — HEPARIN SODIUM (PORCINE) 1000 UNIT/ML IJ SOLN
INTRAMUSCULAR | Status: AC
  Filled 2020-04-02: qty 1

## 2020-04-02 MED ORDER — SODIUM CHLORIDE 0.9% FLUSH: 3.0000 mL | INTRAVENOUS | Status: DC | PRN

## 2020-04-02 MED ORDER — POTASSIUM CHLORIDE CRYS ER 20 MEQ PO TBCR: 40.0000 meq | EXTENDED_RELEASE_TABLET | Freq: Two times a day (BID) | ORAL | Status: DC

## 2020-04-02 MED ORDER — MIDAZOLAM HCL 2 MG/2ML IJ SOLN
INTRAMUSCULAR | Status: AC
  Filled 2020-04-02: qty 2

## 2020-04-02 MED ORDER — ASPIRIN 81 MG PO CHEW
81.0000 mg | CHEWABLE_TABLET | Freq: Every day | ORAL | Status: DC
  Administered 2020-04-03: 81 mg via ORAL
  Filled 2020-04-02: qty 1

## 2020-04-02 MED ORDER — APIXABAN 5 MG PO TABS
5.0000 mg | ORAL_TABLET | Freq: Two times a day (BID) | ORAL | Status: DC
  Administered 2020-04-02 – 2020-04-03 (×2): 5 mg via ORAL
  Filled 2020-04-02 (×2): qty 1

## 2020-04-02 MED ORDER — HEPARIN SODIUM (PORCINE) 1000 UNIT/ML IJ SOLN
INTRAMUSCULAR | Status: DC | PRN
  Administered 2020-04-02: 3000 [IU] via INTRAVENOUS
  Administered 2020-04-02: 5000 [IU] via INTRAVENOUS

## 2020-04-02 MED ORDER — POTASSIUM CHLORIDE CRYS ER 20 MEQ PO TBCR
60.0000 meq | EXTENDED_RELEASE_TABLET | Freq: Once | ORAL | Status: AC
  Administered 2020-04-02: 60 meq via ORAL
  Filled 2020-04-02: qty 3

## 2020-04-02 MED ORDER — AMLODIPINE BESYLATE 10 MG PO TABS
10.0000 mg | ORAL_TABLET | Freq: Every day | ORAL | Status: DC
  Administered 2020-04-02 – 2020-04-03 (×2): 10 mg via ORAL
  Filled 2020-04-02 (×2): qty 1

## 2020-04-02 MED ORDER — MORPHINE SULFATE (PF) 2 MG/ML IV SOLN: 2.0000 mg | INTRAVENOUS | Status: DC | PRN

## 2020-04-02 MED ORDER — ACETAMINOPHEN 325 MG PO TABS: 650.0000 mg | ORAL_TABLET | ORAL | Status: DC | PRN

## 2020-04-02 MED ORDER — SODIUM CHLORIDE 0.9 % IV SOLN: INTRAVENOUS | Status: DC

## 2020-04-02 MED ORDER — POTASSIUM CHLORIDE CRYS ER 20 MEQ PO TBCR
40.0000 meq | EXTENDED_RELEASE_TABLET | Freq: Two times a day (BID) | ORAL | Status: AC
  Administered 2020-04-02 (×2): 40 meq via ORAL
  Filled 2020-04-02 (×2): qty 2

## 2020-04-02 MED ORDER — POTASSIUM CHLORIDE CRYS ER 20 MEQ PO TBCR: 40.0000 meq | EXTENDED_RELEASE_TABLET | Freq: Once | ORAL | Status: DC

## 2020-04-02 MED ORDER — LABETALOL HCL 5 MG/ML IV SOLN: 10.0000 mg | INTRAVENOUS | Status: AC | PRN

## 2020-04-02 SURGICAL SUPPLY — 13 items
CATH LAUNCHER 6FR JR4 (CATHETERS) ×1 IMPLANT
CATH OPTITORQUE TIG 4.0 5F (CATHETERS) ×1 IMPLANT
DEVICE RAD COMP TR BAND LRG (VASCULAR PRODUCTS) ×2 IMPLANT
GLIDESHEATH SLEND A-KIT 6F 22G (SHEATH) ×1 IMPLANT
GUIDEWIRE INQWIRE 1.5J.035X260 (WIRE) IMPLANT
GUIDEWIRE PRESSURE COMET II (WIRE) ×1 IMPLANT
INQWIRE 1.5J .035X260CM (WIRE) ×2
KIT ESSENTIALS PG (KITS) ×1 IMPLANT
KIT HEART LEFT (KITS) ×2 IMPLANT
PACK CARDIAC CATHETERIZATION (CUSTOM PROCEDURE TRAY) ×1 IMPLANT
TRANSDUCER W/STOPCOCK (MISCELLANEOUS) ×2 IMPLANT
TUBING CIL FLEX 10 FLL-RA (TUBING) ×2 IMPLANT
WIRE HI TORQ VERSACORE-J 145CM (WIRE) ×1 IMPLANT

## 2020-04-02 NOTE — Progress Notes (Signed)
   04/02/20 1547  Assess: MEWS Score  Temp 98.1 F (36.7 C)  BP 139/63  Pulse Rate (!) 48  Level of Consciousness Alert  SpO2 96 %  Assess: if the MEWS score is Yellow or Red  Were vital signs taken at a resting state? Yes  Focused Assessment No change from prior assessment  Early Detection of Sepsis Score *See Row Information* Low  MEWS guidelines implemented *See Row Information* No, vital signs rechecked  Treat  MEWS Interventions Other (Comment) (recheck vs they were stable.)  Pain Scale 0-10  Pain Score 0  Escalate  MEWS: Escalate  (no escalation patient already getting frequent vs.)  Notify: Charge Nurse/RN  Name of Charge Nurse/RN Notified  (not this time.)  Notify: Provider  Provider Name/Title  (not this time no change in status. stable.)  Notify: Rapid Response  Name of Rapid Response RN Notified  (not this time.)  Document  Patient Outcome  (stable and remains in our department.)

## 2020-04-02 NOTE — Progress Notes (Addendum)
Patient back from left heart cath,stable talking,repeated vs they were good so mews was not escaled as patient is not in distress and remains on our department. Resp were not correct on machine retaken they were 20 with good o2 sat.   04/02/20 1345  Assess: MEWS Score  BP (!) 153/72  Pulse Rate (!) 58  Resp 10  SpO2 99 %  Assess: MEWS Score  MEWS Temp 0  MEWS Systolic 0  MEWS Pulse 0  MEWS RR 1  MEWS LOC 1  MEWS Score 2  MEWS Score Color Yellow  Assess: if the MEWS score is Yellow or Red  Were vital signs taken at a resting state? Yes  Focused Assessment No change from prior assessment  Early Detection of Sepsis Score *See Row Information* Low  MEWS guidelines implemented *See Row Information* No, vital signs rechecked  Treat  MEWS Interventions Other (Comment) (vs retaken were better.)  Pain Scale 0-10  Pain Score 0  Notify: Charge Nurse/RN  Name of Charge Nurse/RN Notified Jasmina,Rn  Date Charge Nurse/RN Notified 04/02/20  Time Charge Nurse/RN Notified 1430  Notify: Provider  Provider Name/Title  (Dr.Ortiz)  Date Provider Notified 04/02/20  Time Provider Notified 1430  Notification Type Rounds  Notification Reason  (yellow mews score)  Response No new orders  Date of Provider Response 04/02/20  Time of Provider Response 1430  Notify: Rapid Response  Name of Rapid Response RN Notified  (not this time patient stable vs retaken were better.)  Document  Patient Outcome Not stable and remains on department  Progress note created (see row info) Yes

## 2020-04-02 NOTE — Discharge Instructions (Signed)

## 2020-04-02 NOTE — Progress Notes (Addendum)
Progress Note  Patient Name: Albert Powell Date of Encounter: 04/02/2020  Primary Cardiologist: Nanetta Batty, MD  Subjective   No c/p or sob.  Eager to get cath over with.  Maintaining sinus rhythm.  Inpatient Medications    Scheduled Meds: . amLODipine  10 mg Oral Daily  . atorvastatin  40 mg Oral QHS  . clopidogrel  75 mg Oral Daily  . docusate sodium  100 mg Oral BID  . escitalopram  20 mg Oral Daily  . famotidine  20 mg Oral QODAY  . insulin aspart  0-15 Units Subcutaneous TID WC  . insulin aspart  0-5 Units Subcutaneous QHS  . [START ON 04/03/2020] isosorbide mononitrate  90 mg Oral Daily  . metoprolol tartrate  25 mg Oral BID  . mometasone-formoterol  2 puff Inhalation BID  . nicotine  14 mg Transdermal Daily  . nitroGLYCERIN  1 inch Topical Q6H  . pantoprazole  40 mg Oral Daily  . potassium chloride  40 mEq Oral BID  . sodium chloride flush  3 mL Intravenous Q12H   Continuous Infusions: . sodium chloride    . sodium chloride    . sodium chloride 1 mL/kg/hr (04/02/20 0837)  . heparin 1,000 Units/hr (04/02/20 0732)   PRN Meds: sodium chloride, acetaminophen **OR** acetaminophen, bisacodyl, hydrALAZINE, HYDROcodone-acetaminophen, metoprolol tartrate, morphine injection, ondansetron **OR** ondansetron (ZOFRAN) IV, polyethylene glycol, polyvinyl alcohol, sodium chloride flush, zolpidem   Vital Signs    Vitals:   04/02/20 0349 04/02/20 0846 04/02/20 0930 04/02/20 1140  BP: 140/63 (!) 182/81 (!) 148/75 (!) 151/66  Pulse: 60 63 62 (!) 52  Resp: 16 20 17 17   Temp: 98.6 F (37 C) 97.8 F (36.6 C)  98.4 F (36.9 C)  TempSrc: Oral Oral  Oral  SpO2: 97% 99% 97% 99%  Weight: 104.2 kg     Height:        Intake/Output Summary (Last 24 hours) at 04/02/2020 1204 Last data filed at 04/02/2020 0757 Gross per 24 hour  Intake 525.78 ml  Output 550 ml  Net -24.22 ml   Filed Weights   03/31/20 0449 04/01/20 0406 04/02/20 0349  Weight: 103.1 kg 104 kg 104.2 kg     Physical Exam   GEN: Obese, in no acute distress.  HEENT: Grossly normal.  Neck: Supple, obese, difficult to gauge JVP.  No carotid bruits, or masses. Cardiac: RRR, brady, distant, no murmurs, rubs, or gallops. No clubbing, cyanosis, edema.  Radials 2+, DP/PT 1+ and equal bilaterally.  Respiratory:  Respirations regular and unlabored, clear to auscultation bilaterally. GI: Obese, soft, nontender, nondistended, BS + x 4. MS: no deformity or atrophy. Skin: warm and dry, no rash. Neuro:  Strength and sensation are intact. Psych: AAOx3.  Normal affect.  Labs    Chemistry Recent Labs  Lab 03/30/20 1230 04/01/20 0621 04/02/20 0530  NA 137 137 137  K 3.8 3.3* 2.9*  CL 97* 96* 101  CO2 23 30 27   GLUCOSE 179* 105* 109*  BUN 16 24* 24*  CREATININE 1.51* 1.91* 1.66*  CALCIUM 9.6 9.5 8.7*  PROT 7.1  --   --   ALBUMIN 4.0  --   --   AST 25  --   --   ALT 35  --   --   ALKPHOS 76  --   --   BILITOT 0.9  --   --   GFRNONAA 44* 33* 39*  GFRAA 51* 38* 45*  ANIONGAP 17* 11 9  Hematology Recent Labs  Lab 03/31/20 0921 04/01/20 0621 04/02/20 0530  WBC 9.7 7.3 7.1  RBC 4.09* 4.26 3.75*  HGB 12.4* 12.6* 11.1*  HCT 37.2* 38.2* 33.7*  MCV 91.0 89.7 89.9  MCH 30.3 29.6 29.6  MCHC 33.3 33.0 32.9  RDW 13.7 13.7 13.6  PLT 247 223 191    Cardiac Enzymes  Recent Labs  Lab 03/30/20 1230 03/30/20 1524  TROPONINIHS 17 17      BNP Recent Labs  Lab 03/30/20 1251  BNP 199.3*     Lab Results  Component Value Date   CHOL 122 03/30/2020   HDL 53 03/30/2020   LDLCALC 52 03/30/2020   TRIG 85 03/30/2020   CHOLHDL 2.3 03/30/2020    Lab Results  Component Value Date   HGBA1C 6.7 (H) 03/30/2020      Radiology    CT HEAD WO CONTRAST  Result Date: 03/30/2020 CLINICAL DATA:  Transient ischemic attack (TIA). Additional provided: New onset atrial fibrillation, EXAM: CT HEAD WITHOUT CONTRAST TECHNIQUE: Contiguous axial images were obtained from the base of the skull  through the vertex without intravenous contrast. COMPARISON:  Head CT 03/27/2017 FINDINGS: Brain: Stable, moderate generalized parenchymal atrophy. Stable, mild ill-defined hypoattenuation within the cerebral white matter is nonspecific, but consistent with chronic small vessel ischemic disease. Redemonstrated small chronic lacunar infarct within the right caudate nucleus (series 2, image 21). There is no acute intracranial hemorrhage. No demarcated cortical infarct is identified. No extra-axial fluid collection. No evidence of intracranial mass. No midline shift. Vascular: No hyperdense vessel.  Atherosclerotic calcifications. Skull: Normal. Negative for fracture or focal lesion. Sinuses/Orbits: Visualized orbits show no acute finding. Mild ethmoid sinus mucosal thickening. Small amount of frothy secretions within the right maxillary sinus. No significant mastoid effusion. IMPRESSION: No CT evidence of acute intracranial abnormality. Moderate generalized parenchymal atrophy and mild chronic small vessel ischemic disease, stable as compared to the head CT of 03/27/2017. Redemonstrated small chronic right basal ganglia lacunar infarct. Paranasal sinus disease as described. Electronically Signed   By: Jackey Loge DO   On: 03/30/2020 18:55   DG Chest Port 1 View  Result Date: 03/30/2020 CLINICAL DATA:  Chest pain. EXAM: PORTABLE CHEST 1 VIEW COMPARISON:  May 31, 2013. FINDINGS: Stable cardiomediastinal silhouette. No pneumothorax or pleural effusion is noted. No acute pulmonary disease is noted. Bony thorax is unremarkable. IMPRESSION: No active disease. Aortic Atherosclerosis (ICD10-I70.0). Electronically Signed   By: Lupita Raider M.D.   On: 03/30/2020 13:58    Telemetry    Sinus brady - sinus rhythm 50s-60s, occas pvc - Personally Reviewed  Cardiac Studies   2D Echocardiogram 7.16.2021   1. Left ventricular ejection fraction, by estimation, is 55 to 60%. The  left ventricle has normal  function. The left ventricle has no regional  wall motion abnormalities. Left ventricular diastolic parameters are  consistent with Grade I diastolic  dysfunction (impaired relaxation).   2. Right ventricular systolic function is normal. The right ventricular  size is normal.   3. The mitral valve is normal in structure. No evidence of mitral valve  regurgitation. No evidence of mitral stenosis.   4. The aortic valve is tricuspid. Aortic valve regurgitation is not  visualized. Mild aortic valve sclerosis is present, with no evidence of  aortic valve stenosis.   5. The inferior vena cava is normal in size with greater than 50%  respiratory variability, suggesting right atrial pressure of 3 mmHg.    Patient Profile  79 y.o. male w/ a h/o CAD s/p stenting to the RCA x2 in 2003 with nonischemic stress testing in 2014, carotid arterial disease status post left carotid endarterectomy in August 2014, PAD status post bilateral iliac stenting in July 2019, hypertension, hyperlipidemia, OSA (untreated), and type 2 diabetes mellitus, presented with A. fib with RVR and chest pain on July 16.  Assessment & Plan    1.  Paroxysmal atrial fibrillation with rapid ventricular response: Presented on July 16 with chest pain, nausea, vomiting, and new diagnosis of atrial fibrillation.  He has since converted to sinus rhythm and maintained sinus on oral beta-blocker therapy.  In the setting of chest pain and mild troponin elevation, he is currently on heparin but we would plan to transition to oral anticoagulation given CHA2DS2-VASc of 5, post catheterization.  2. Chest pain/CAD: Prior stenting in RCA in 2003 with residual LAD disease.  Nonischemic Myoview in May 2014.  He presented with chest pain in the setting of rapid atrial fibrillation.  Troponins normal.  Echo w/ nl EF.  No chest pain or dyspnea overnight.  He is scheduled for diagnostic catheterization this morning and remains on heparin, aspirin,  statin, Plavix, nitrate, and beta-blocker therapy.  3.  Essential hypertension: Blood pressures have been variable but he is markedly elevated this morning at 182/81.  He remains on amlodipine, nitrate, and low-dose beta-blocker therapy.  Home dose of losartan HCTZ is currently on hold in the setting of acute kidney injury.  Continue as needed hydralazine for the time being.  If we are unable to reinitiate losartan HCTZ, would add scheduled hydralazine versus transitioning from metoprolol to carvedilol.  4.  Hyperlipidemia: LDL of 52.  Continue statin therapy.  5.  Type 2 diabetes mellitus: A1c 6.7.  Continue insulin therapy.  6.  Hypokalemia: Potassium 2.9 this morning-supplementation ordered by primary team.  7.  Acute kidney injury/CKD 3: Creatinine normal in 2015 but elevated at 1.5 in July 2019.  He was 1.51 this admission and peaked at 1.91 July 18.  He is 1.66 this morning.  ARB and diuretic therapy on hold.  He is receiving hydration precatheterization.  Follow-up post cath.  8.  Peripheral arterial disease: Status post bilateral iliac stenting in July 2019 with stable ABIs last year (right: 0.76, left: 0.87).  He will need outpatient follow-up.  Continue aspirin and statin therapy.  9.  Carotid arterial disease: Status post prior endarterectomy on the left.  1 to 39% bilateral stenoses by ultrasound in May 2019.  10.  OSA:  Previously dx but did not tolerate mask. We discussed risk of recurrent afib w/ untreated OSA.  He'd be willing to repeat sleep testing and try nasal prongs.  Signed, Tobias Alexander, MD  04/02/2020, 12:04 PM    For questions or updates, please contact   Please consult [email or web address] for contact info under Cardiology/STEMI.   The patient was seen, examined and discussed with Ward Givens, NP and agree as above.  The patient chest pain free this am, awaiting cath, possible discharge if no intervention. He remains in SR, on Heparin drip, we will start Eliquis 5 mg  PO BID post cath. He is hypertensive, I would increase Imdur to 90 mg PO QD and arrange for an outpatient follow up for BP check up. KCl is being replaced.  Tobias Alexander, MD 04/02/2020   Addendum:  The patient did not have any significant coronary artery disease on cardiac cath today, he remains in sinus rhythm, we will start him  on Eliquis starting tonight, he can be discharged today, we will arrange for outpatient follow-up within the next 2 weeks, he will also need to have his potassium checked within the next 5 days.  Tobias Alexander, MD 04/02/2020

## 2020-04-02 NOTE — Progress Notes (Signed)
ANTICOAGULATION CONSULT NOTE - Follow Up Consult  Pharmacy Consult for Heparin Indication: atrial fibrillation  Allergies  Allergen Reactions  . Fentanyl Other (See Comments)    Behavioral changes  . Gabapentin Other (See Comments)    ankle swells  . Lisinopril Other (See Comments)    unknown  . Lyrica [Pregabalin] Other (See Comments)    unknown  . Metformin Diarrhea  . Propofol Other (See Comments)    Heart rate dropped  . Shellfish Allergy Other (See Comments)    Other Other-Mild skin reaction to shrimp    Patient Measurements: Height: 5\' 9"  (175.3 cm) Weight: 104.2 kg (229 lb 12.8 oz) (scale a) IBW/kg (Calculated) : 70.7 Heparin Dosing Weight: 93.8 kg  Vital Signs: Temp: 98.6 F (37 C) (07/19 0349) Temp Source: Oral (07/19 0349) BP: 140/63 (07/19 0349) Pulse Rate: 60 (07/19 0349)  Labs: Recent Labs     0000 03/30/20 1230 03/30/20 1524 03/30/20 2005 03/31/20 0921 03/31/20 0921 04/01/20 0621 04/01/20 1735 04/02/20 0530  HGB   < > 14.8  --   --  12.4*   < > 12.6*  --  11.1*  HCT   < > 44.7  --   --  37.2*  --  38.2*  --  33.7*  PLT   < > 252  --   --  247  --  223  --  191  LABPROT  --  12.6  --   --   --   --   --   --   --   INR  --  1.0  --   --   --   --   --   --   --   HEPARINUNFRC  --   --   --    < > 0.66   < > 1.01* 0.50 0.46  CREATININE  --  1.51*  --   --   --   --  1.91*  --   --   TROPONINIHS  --  17 17  --   --   --   --   --   --    < > = values in this interval not displayed.    Estimated Creatinine Clearance: 37.9 mL/min (A) (by C-G formula based on SCr of 1.91 mg/dL (H)).  Assessment: 79 YOM on heparin gtt for new afib. -heparin level at goal after holding and decreasing infusion  7/19 AM update:  Heparin level now therapeutic x 2   Goal of Therapy:  Heparin level 0.3-0.7 units/ml Monitor platelets by anticoagulation protocol: Yes   Plan:  Cont heparin 1000 units/hr Daily CBC/HL  8/19, PharmD, BCPS Clinical  Pharmacist Phone: [phone]

## 2020-04-02 NOTE — H&P (View-Only) (Signed)
Progress Note  Patient Name: Albert Powell Date of Encounter: 04/02/2020  Primary Cardiologist: Nanetta Batty, MD  Subjective   No c/p or sob.  Eager to get cath over with.  Maintaining sinus rhythm.  Inpatient Medications    Scheduled Meds: . amLODipine  10 mg Oral Daily  . atorvastatin  40 mg Oral QHS  . clopidogrel  75 mg Oral Daily  . docusate sodium  100 mg Oral BID  . escitalopram  20 mg Oral Daily  . famotidine  20 mg Oral QODAY  . insulin aspart  0-15 Units Subcutaneous TID WC  . insulin aspart  0-5 Units Subcutaneous QHS  . [START ON 04/03/2020] isosorbide mononitrate  90 mg Oral Daily  . metoprolol tartrate  25 mg Oral BID  . mometasone-formoterol  2 puff Inhalation BID  . nicotine  14 mg Transdermal Daily  . nitroGLYCERIN  1 inch Topical Q6H  . pantoprazole  40 mg Oral Daily  . potassium chloride  40 mEq Oral BID  . sodium chloride flush  3 mL Intravenous Q12H   Continuous Infusions: . sodium chloride    . sodium chloride    . sodium chloride 1 mL/kg/hr (04/02/20 0837)  . heparin 1,000 Units/hr (04/02/20 0732)   PRN Meds: sodium chloride, acetaminophen **OR** acetaminophen, bisacodyl, hydrALAZINE, HYDROcodone-acetaminophen, metoprolol tartrate, morphine injection, ondansetron **OR** ondansetron (ZOFRAN) IV, polyethylene glycol, polyvinyl alcohol, sodium chloride flush, zolpidem   Vital Signs    Vitals:   04/02/20 0349 04/02/20 0846 04/02/20 0930 04/02/20 1140  BP: 140/63 (!) 182/81 (!) 148/75 (!) 151/66  Pulse: 60 63 62 (!) 52  Resp: 16 20 17 17   Temp: 98.6 F (37 C) 97.8 F (36.6 C)  98.4 F (36.9 C)  TempSrc: Oral Oral  Oral  SpO2: 97% 99% 97% 99%  Weight: 104.2 kg     Height:        Intake/Output Summary (Last 24 hours) at 04/02/2020 1204 Last data filed at 04/02/2020 0757 Gross per 24 hour  Intake 525.78 ml  Output 550 ml  Net -24.22 ml   Filed Weights   03/31/20 0449 04/01/20 0406 04/02/20 0349  Weight: 103.1 kg 104 kg 104.2 kg     Physical Exam   GEN: Obese, in no acute distress.  HEENT: Grossly normal.  Neck: Supple, obese, difficult to gauge JVP.  No carotid bruits, or masses. Cardiac: RRR, brady, distant, no murmurs, rubs, or gallops. No clubbing, cyanosis, edema.  Radials 2+, DP/PT 1+ and equal bilaterally.  Respiratory:  Respirations regular and unlabored, clear to auscultation bilaterally. GI: Obese, soft, nontender, nondistended, BS + x 4. MS: no deformity or atrophy. Skin: warm and dry, no rash. Neuro:  Strength and sensation are intact. Psych: AAOx3.  Normal affect.  Labs    Chemistry Recent Labs  Lab 03/30/20 1230 04/01/20 0621 04/02/20 0530  NA 137 137 137  K 3.8 3.3* 2.9*  CL 97* 96* 101  CO2 23 30 27   GLUCOSE 179* 105* 109*  BUN 16 24* 24*  CREATININE 1.51* 1.91* 1.66*  CALCIUM 9.6 9.5 8.7*  PROT 7.1  --   --   ALBUMIN 4.0  --   --   AST 25  --   --   ALT 35  --   --   ALKPHOS 76  --   --   BILITOT 0.9  --   --   GFRNONAA 44* 33* 39*  GFRAA 51* 38* 45*  ANIONGAP 17* 11 9  Hematology Recent Labs  Lab 03/31/20 0921 04/01/20 0621 04/02/20 0530  WBC 9.7 7.3 7.1  RBC 4.09* 4.26 3.75*  HGB 12.4* 12.6* 11.1*  HCT 37.2* 38.2* 33.7*  MCV 91.0 89.7 89.9  MCH 30.3 29.6 29.6  MCHC 33.3 33.0 32.9  RDW 13.7 13.7 13.6  PLT 247 223 191    Cardiac Enzymes  Recent Labs  Lab 03/30/20 1230 03/30/20 1524  TROPONINIHS 17 17      BNP Recent Labs  Lab 03/30/20 1251  BNP 199.3*     Lab Results  Component Value Date   CHOL 122 03/30/2020   HDL 53 03/30/2020   LDLCALC 52 03/30/2020   TRIG 85 03/30/2020   CHOLHDL 2.3 03/30/2020    Lab Results  Component Value Date   HGBA1C 6.7 (H) 03/30/2020      Radiology    CT HEAD WO CONTRAST  Result Date: 03/30/2020 CLINICAL DATA:  Transient ischemic attack (TIA). Additional provided: New onset atrial fibrillation, EXAM: CT HEAD WITHOUT CONTRAST TECHNIQUE: Contiguous axial images were obtained from the base of the skull  through the vertex without intravenous contrast. COMPARISON:  Head CT 03/27/2017 FINDINGS: Brain: Stable, moderate generalized parenchymal atrophy. Stable, mild ill-defined hypoattenuation within the cerebral white matter is nonspecific, but consistent with chronic small vessel ischemic disease. Redemonstrated small chronic lacunar infarct within the right caudate nucleus (series 2, image 21). There is no acute intracranial hemorrhage. No demarcated cortical infarct is identified. No extra-axial fluid collection. No evidence of intracranial mass. No midline shift. Vascular: No hyperdense vessel.  Atherosclerotic calcifications. Skull: Normal. Negative for fracture or focal lesion. Sinuses/Orbits: Visualized orbits show no acute finding. Mild ethmoid sinus mucosal thickening. Small amount of frothy secretions within the right maxillary sinus. No significant mastoid effusion. IMPRESSION: No CT evidence of acute intracranial abnormality. Moderate generalized parenchymal atrophy and mild chronic small vessel ischemic disease, stable as compared to the head CT of 03/27/2017. Redemonstrated small chronic right basal ganglia lacunar infarct. Paranasal sinus disease as described. Electronically Signed   By: Jackey Loge DO   On: 03/30/2020 18:55   DG Chest Port 1 View  Result Date: 03/30/2020 CLINICAL DATA:  Chest pain. EXAM: PORTABLE CHEST 1 VIEW COMPARISON:  May 31, 2013. FINDINGS: Stable cardiomediastinal silhouette. No pneumothorax or pleural effusion is noted. No acute pulmonary disease is noted. Bony thorax is unremarkable. IMPRESSION: No active disease. Aortic Atherosclerosis (ICD10-I70.0). Electronically Signed   By: Lupita Raider M.D.   On: 03/30/2020 13:58    Telemetry    Sinus brady - sinus rhythm 50s-60s, occas pvc - Personally Reviewed  Cardiac Studies   2D Echocardiogram 7.16.2021   1. Left ventricular ejection fraction, by estimation, is 55 to 60%. The  left ventricle has normal  function. The left ventricle has no regional  wall motion abnormalities. Left ventricular diastolic parameters are  consistent with Grade I diastolic  dysfunction (impaired relaxation).   2. Right ventricular systolic function is normal. The right ventricular  size is normal.   3. The mitral valve is normal in structure. No evidence of mitral valve  regurgitation. No evidence of mitral stenosis.   4. The aortic valve is tricuspid. Aortic valve regurgitation is not  visualized. Mild aortic valve sclerosis is present, with no evidence of  aortic valve stenosis.   5. The inferior vena cava is normal in size with greater than 50%  respiratory variability, suggesting right atrial pressure of 3 mmHg.    Patient Profile  79 y.o. male w/ a h/o CAD s/p stenting to the RCA x2 in 2003 with nonischemic stress testing in 2014, carotid arterial disease status post left carotid endarterectomy in August 2014, PAD status post bilateral iliac stenting in July 2019, hypertension, hyperlipidemia, OSA (untreated), and type 2 diabetes mellitus, presented with A. fib with RVR and chest pain on July 16.  Assessment & Plan    1.  Paroxysmal atrial fibrillation with rapid ventricular response: Presented on July 16 with chest pain, nausea, vomiting, and new diagnosis of atrial fibrillation.  He has since converted to sinus rhythm and maintained sinus on oral beta-blocker therapy.  In the setting of chest pain and mild troponin elevation, he is currently on heparin but we would plan to transition to oral anticoagulation given CHA2DS2-VASc of 5, post catheterization.  2. Chest pain/CAD: Prior stenting in RCA in 2003 with residual LAD disease.  Nonischemic Myoview in May 2014.  He presented with chest pain in the setting of rapid atrial fibrillation.  Troponins normal.  Echo w/ nl EF.  No chest pain or dyspnea overnight.  He is scheduled for diagnostic catheterization this morning and remains on heparin, aspirin,  statin, Plavix, nitrate, and beta-blocker therapy.  3.  Essential hypertension: Blood pressures have been variable but he is markedly elevated this morning at 182/81.  He remains on amlodipine, nitrate, and low-dose beta-blocker therapy.  Home dose of losartan HCTZ is currently on hold in the setting of acute kidney injury.  Continue as needed hydralazine for the time being.  If we are unable to reinitiate losartan HCTZ, would add scheduled hydralazine versus transitioning from metoprolol to carvedilol.  4.  Hyperlipidemia: LDL of 52.  Continue statin therapy.  5.  Type 2 diabetes mellitus: A1c 6.7.  Continue insulin therapy.  6.  Hypokalemia: Potassium 2.9 this morning-supplementation ordered by primary team.  7.  Acute kidney injury/CKD 3: Creatinine normal in 2015 but elevated at 1.5 in July 2019.  He was 1.51 this admission and peaked at 1.91 July 18.  He is 1.66 this morning.  ARB and diuretic therapy on hold.  He is receiving hydration precatheterization.  Follow-up post cath.  8.  Peripheral arterial disease: Status post bilateral iliac stenting in July 2019 with stable ABIs last year (right: 0.76, left: 0.87).  He will need outpatient follow-up.  Continue aspirin and statin therapy.  9.  Carotid arterial disease: Status post prior endarterectomy on the left.  1 to 39% bilateral stenoses by ultrasound in May 2019.  10.  OSA:  Previously dx but did not tolerate mask. We discussed risk of recurrent afib w/ untreated OSA.  He'd be willing to repeat sleep testing and try nasal prongs.  Signed, Tobias Alexander, MD  04/02/2020, 12:04 PM    For questions or updates, please contact   Please consult [email or web address] for contact info under Cardiology/STEMI.   The patient was seen, examined and discussed with Ward Givens, NP and agree as above.  The patient chest pain free this am, awaiting cath, possible discharge if no intervention. He remains in SR, on Heparin drip, we will start Eliquis 5 mg  PO BID post cath. He is hypertensive, I would increase Imdur to 90 mg PO QD and arrange for an outpatient follow up for BP check up. KCl is being replaced.  Tobias Alexander, MD 04/02/2020

## 2020-04-02 NOTE — Progress Notes (Signed)
To OR for heart cath procedure.

## 2020-04-02 NOTE — Interval H&P Note (Signed)
Cath Lab Visit (complete for each Cath Lab visit)  Clinical Evaluation Leading to the Procedure:   ACS: Yes.    Non-ACS:    Anginal Classification: CCS III  Anti-ischemic medical therapy: Maximal Therapy (2 or more classes of medications)  Non-Invasive Test Results: No non-invasive testing performed  Prior CABG: No previous CABG      History and Physical Interval Note:  04/02/2020 1:07 PM  Albert Powell  has presented today for surgery, with the diagnosis of CAD, Chest pain.  The various methods of treatment have been discussed with the patient and family. After consideration of risks, benefits and other options for treatment, the patient has consented to  Procedure(s): LEFT HEART CATH AND CORONARY ANGIOGRAPHY (N/A) as a surgical intervention.  The patient's history has been reviewed, patient examined, no change in status, stable for surgery.  I have reviewed the patient's chart and labs.  Questions were answered to the patient's satisfaction.     Nanetta Batty

## 2020-04-02 NOTE — Plan of Care (Signed)

## 2020-04-02 NOTE — Progress Notes (Signed)
Dr. Delton See requested f/u for this patient - NL availability pushed out several weeks so have arranged afib clinic visit on 04/09/20 at 11 am - appt info placed on AVS. Darriana Deboy PA-C

## 2020-04-02 NOTE — Progress Notes (Signed)
Wife has left bedside. Patient has successfully been weaned off TR band has tegaderm dressing intact to right wrist patient instructed not to remove for at least 24 hours and call nursing any bleeding noted.

## 2020-04-02 NOTE — Progress Notes (Signed)
[facility] PROGRESS NOTE    Progress Note  VERTIS SCHEIB  YOV:[id number] DOB: 1941/02/10 DOA: 03/30/2020 PCP: [name], MD     Brief Narrative:   Albert Powell is an 79 y.o. male past medical history significant for essential hypertension hyperlipidemia CAD and chest pain which developed nausea vomiting and sweating profusely while walking to the car, later developed substernal chest pain and jaw pain, EMS was called she was found in A. fib.  The ED was found to be in new onset A. fib with RVR started on Lopressor and heparin cardiac biomarkers negative.  Assessment/Plan:   New onset atrial fibrillation [facility]): On metoprolol and IV heparin cardiac biomarkers negative. Continue current medications, for cardiac cath on 04/02/2020. Yesterday his creatinine was 1.9, today 1.6, he continues to be on IV fluid. Further management per cardiology.  CAD hx of 2 stents to RCA in 2003,  80-90% stenosis of LAD/CFX June 2014PAD: Continue Plavix and Imdur. Patient with significant history of CAD for cardiac cath probably Monday.  Hypertensive urgency: Hyzaar was discontinued.  Due to acute kidney injury. He was fluid resuscitated now his blood pressure is elevated, will start him on Norvasc.  Acute kidney injury: Improve with IV fluid hydration.  Hypokalemia: Despite repletion potassium trending in the right direction, will higher dose of oral potassium supplement recheck basic metabolic panel tomorrow morning.  Hyperlipidemia: Continue Lipitor.  Diabetes mellitus type 2 with peripheral vascular disease: With an A1c of 6.7 continue long-acting insulin plus sliding scale.  Tobacco dependence: Counseling.  Obesity: Counseling  DVT prophylaxis: heparin Family Communication:none Status is: Observation  The patient remains OBS appropriate and will d/c before 2 midnights.  Dispo: The patient is from: Home              Anticipated d/c is to: Home               Anticipated d/c date is: 3 days              Patient currently is not medically stable to d/c.        Code Status:     Code Status Orders  (From admission, onward)         Start     Ordered   03/30/20 1533  Full code  Continuous        03/30/20 1535        Code Status History    Date Active Date Inactive Code Status Order ID Comments User Context   03/30/2018 1202 03/31/2018 1439 Full Code [id number]  Nada Libman, MD Inpatient   06/02/2013 1252 06/03/2013 1334 Full Code [id number]  Garth Schlatter Inpatient   02/04/2013 2243 02/08/2013 1241 Full Code [id number]  Eduard Clos, MD Inpatient   Advance Care Planning Activity        IV Access:    Peripheral IV   Procedures and diagnostic studies:   No results found.   Medical Consultants:    None.  Anti-Infectives:   none  Subjective:    [name] currently chest pain-free.  Objective:    Vitals:   04/01/20 1926 04/01/20 1953 04/02/20 0349 04/02/20 0846  BP:  (!) 142/64 140/63 (!) 182/81  Pulse:  67 60 63  Resp:  18 16 20   Temp:  98.3 F (36.8 C) 98.6 F (37 C) 97.8 F (36.6 C)  TempSrc:  Oral Oral Oral  SpO2: 93% 98% 97% 99%  Weight:   104.2 kg  Height:       SpO2: 99 %   Intake/Output Summary (Last 24 hours) at 04/02/2020 0850 Last data filed at 04/02/2020 0757 Gross per 24 hour  Intake 525.78 ml  Output 550 ml  Net -24.22 ml   Filed Weights   03/31/20 0449 04/01/20 0406 04/02/20 0349  Weight: 103.1 kg 104 kg 104.2 kg    Exam: General exam: In no acute distress. Respiratory system: Good air movement and clear to auscultation. Cardiovascular system: S1 & S2 heard, RRR. No JVD. Gastrointestinal system: Abdomen is nondistended, soft and nontender.  Extremities: No pedal edema. Skin: No rashes, lesions or ulcers Psychiatry: Judgement and insight appear normal. Mood & affect appropriate.  Data Reviewed:    Labs: Basic Metabolic Panel: Recent Labs  Lab  03/30/20 1230 03/30/20 1230 04/01/20 0621 04/02/20 0530  NA 137  --  137 137  K 3.8   < > 3.3* 2.9*  CL 97*  --  96* 101  CO2 23  --  30 27  GLUCOSE 179*  --  105* 109*  BUN 16  --  24* 24*  CREATININE 1.51*  --  1.91* 1.66*  CALCIUM 9.6  --  9.5 8.7*   < > = values in this interval not displayed.   GFR Estimated Creatinine Clearance: 43.6 mL/min (A) (by C-G formula based on SCr of 1.66 mg/dL (H)). Liver Function Tests: Recent Labs  Lab 03/30/20 1230  AST 25  ALT 35  ALKPHOS 76  BILITOT 0.9  PROT 7.1  ALBUMIN 4.0   Recent Labs  Lab 03/30/20 1230  LIPASE 41   No results for input(s): AMMONIA in the last 168 hours. Coagulation profile Recent Labs  Lab 03/30/20 1230  INR 1.0   COVID-19 Labs  No results for input(s): DDIMER, FERRITIN, LDH, CRP in the last 72 hours.  Lab Results  Component Value Date   SARSCOV2NAA NEGATIVE 03/30/2020    CBC: Recent Labs  Lab 03/30/20 1230 03/31/20 0921 04/01/20 0621 04/02/20 0530  WBC 12.3* 9.7 7.3 7.1  NEUTROABS 10.2*  --   --   --   HGB 14.8 12.4* 12.6* 11.1*  HCT 44.7 37.2* 38.2* 33.7*  MCV 89.4 91.0 89.7 89.9  PLT 252 247 223 191   Cardiac Enzymes: No results for input(s): CKTOTAL, CKMB, CKMBINDEX, TROPONINI in the last 168 hours. BNP (last 3 results) No results for input(s): PROBNP in the last 8760 hours. CBG: Recent Labs  Lab 04/01/20 0601 04/01/20 1212 04/01/20 1658 04/01/20 2106 04/02/20 0607  GLUCAP 98 131* 130* 134* 95   D-Dimer: No results for input(s): DDIMER in the last 72 hours. Hgb A1c: Recent Labs    03/30/20 1837  HGBA1C 6.7*   Lipid Profile: Recent Labs    03/30/20 1837  CHOL 122  HDL 53  LDLCALC 52  TRIG 85  CHOLHDL 2.3   Thyroid function studies: Recent Labs    03/30/20 1837  TSH 3.557   Anemia work up: No results for input(s): VITAMINB12, FOLATE, FERRITIN, TIBC, IRON, RETICCTPCT in the last 72 hours. Sepsis Labs: Recent Labs  Lab 03/30/20 1230 03/31/20 0921  04/01/20 0621 04/02/20 0530  WBC 12.3* 9.7 7.3 7.1   Microbiology Recent Results (from the past 240 hour(s))  SARS Coronavirus 2 by RT PCR (hospital order, performed in [facility] hospital lab) Nasopharyngeal Nasopharyngeal Swab     Status: None   Collection Time: 03/30/20  6:32 PM   Specimen: Nasopharyngeal Swab  Result Value Ref Range Status  SARS Coronavirus 2 NEGATIVE NEGATIVE Final    Comment: (NOTE) SARS-CoV-2 target nucleic acids are NOT DETECTED.  The SARS-CoV-2 RNA is generally detectable in upper and lower respiratory specimens during the acute phase of infection. The lowest concentration of SARS-CoV-2 viral copies this assay can detect is 250 copies / mL. A negative result does not preclude SARS-CoV-2 infection and should not be used as the sole basis for treatment or other patient management decisions.  A negative result may occur with improper specimen collection / handling, submission of specimen other than nasopharyngeal swab, presence of viral mutation(s) within the areas targeted by this assay, and inadequate number of viral copies (<250 copies / mL). A negative result must be combined with clinical observations, patient history, and epidemiological information.  Fact Sheet for Patients:   BoilerBrush.com.cy  Fact Sheet for Healthcare Providers: https://pope.com/  This test is not yet approved or  cleared by the [place] FDA and has been authorized for detection and/or diagnosis of SARS-CoV-2 by FDA under an Emergency Use Authorization (EUA).  This EUA will remain in effect (meaning this test can be used) for the duration of the COVID-19 declaration under Section 564(b)(1) of the Act, 21 U.S.C. section 360bbb-3(b)(1), unless the authorization is terminated or revoked sooner.  Performed at [facility], [address] [place], [place] [address]      Medications:   . atorvastatin  40 mg Oral  QHS  . clopidogrel  75 mg Oral Daily  . docusate sodium  100 mg Oral BID  . escitalopram  20 mg Oral Daily  . famotidine  20 mg Oral QODAY  . insulin aspart  0-15 Units Subcutaneous TID WC  . insulin aspart  0-5 Units Subcutaneous QHS  . isosorbide mononitrate  60 mg Oral Daily  . metoprolol tartrate  25 mg Oral BID  . mometasone-formoterol  2 puff Inhalation BID  . nicotine  14 mg Transdermal Daily  . nitroGLYCERIN  1 inch Topical Q6H  . pantoprazole  40 mg Oral Daily  . potassium chloride  40 mEq Oral Once  . sodium chloride flush  3 mL Intravenous Q12H   Continuous Infusions: . sodium chloride    . sodium chloride 1 mL/kg/hr (04/02/20 0837)  . heparin 1,000 Units/hr (04/02/20 0732)      LOS: 3 days   Marinda Elk  [facility]  04/02/2020, 8:50 AM

## 2020-04-02 NOTE — TOC Benefit Eligibility Note (Signed)
Transition of Care [facility]) Benefit Eligibility Note    Patient Details  Name: Albert Powell MRN: [id number] Date of Birth: 1941-02-03   Medication/Dose: Arne Cleveland  5 MG BID  Covered?: Yes  Tier: 3 Drug  Prescription Coverage Preferred Pharmacy: [facility]  Spoke with Person/Company/Phone Number:: [id number]  @  [employer] # [phone]  Co-Pay: $131.85  Prior Approval: No  Deductible: Met Duayne Cal Phone Number: 04/02/2020, 3:15 PM

## 2020-04-03 DIAGNOSIS — E1151 Type 2 diabetes mellitus with diabetic peripheral angiopathy without gangrene: Secondary | ICD-10-CM

## 2020-04-03 DIAGNOSIS — Z794 Long term (current) use of insulin: Secondary | ICD-10-CM

## 2020-04-03 DIAGNOSIS — Z720 Tobacco use: Secondary | ICD-10-CM

## 2020-04-03 DIAGNOSIS — E669 Obesity, unspecified: Secondary | ICD-10-CM

## 2020-04-03 LAB — BASIC METABOLIC PANEL
Anion gap: 8 (ref 5–15)
BUN: 15 mg/dL (ref 8–23)
CO2: 24 mmol/L (ref 22–32)
Calcium: 8.8 mg/dL — ABNORMAL LOW (ref 8.9–10.3)
Chloride: 108 mmol/L (ref 98–111)
Creatinine, Ser: 1.4 mg/dL — ABNORMAL HIGH (ref 0.61–1.24)
GFR calc Af Amer: 55 mL/min — ABNORMAL LOW (ref >60)
GFR calc non Af Amer: 48 mL/min — ABNORMAL LOW (ref >60)
Glucose, Bld: 97 mg/dL (ref 70–99)
Potassium: 4.5 mmol/L (ref 3.5–5.1)
Sodium: 140 mmol/L (ref 135–145)

## 2020-04-03 LAB — CBC
HCT: 33.4 % — ABNORMAL LOW (ref 39.0–52.0)
Hemoglobin: 10.9 g/dL — ABNORMAL LOW (ref 13.0–17.0)
MCH: 29.9 pg (ref 26.0–34.0)
MCHC: 32.6 g/dL (ref 30.0–36.0)
MCV: 91.8 fL (ref 80.0–100.0)
Platelets: 181 10*3/uL (ref 150–400)
RBC: 3.64 MIL/uL — ABNORMAL LOW (ref 4.22–5.81)
RDW: 14 % (ref 11.5–15.5)
WBC: 6.4 10*3/uL (ref 4.0–10.5)
nRBC: 0 % (ref 0.0–0.2)

## 2020-04-03 LAB — GLUCOSE, CAPILLARY: Glucose-Capillary: 80 mg/dL (ref 70–99)

## 2020-04-03 MED ORDER — APIXABAN 5 MG PO TABS: 5.0000 mg | ORAL_TABLET | Freq: Two times a day (BID) | ORAL | 3 refills | Status: DC

## 2020-04-03 MED ORDER — METOPROLOL TARTRATE 25 MG PO TABS: 25.0000 mg | ORAL_TABLET | Freq: Two times a day (BID) | ORAL | 3 refills | Status: DC

## 2020-04-03 MED ORDER — CLOPIDOGREL BISULFATE 75 MG PO TABS: 75.0000 mg | ORAL_TABLET | Freq: Every day | ORAL | 2 refills | Status: DC

## 2020-04-03 MED FILL — ELIQUIS 5 MG TABLET: 5 | 30 days supply | Qty: 60 | Fill #0

## 2020-04-03 MED FILL — CLOPIDOGREL 75 MG TABLET: 75 | 90 days supply | Qty: 90 | Fill #0

## 2020-04-03 MED FILL — METOPROLOL TARTRATE 25 MG T: 25 | 30 days supply | Qty: 60 | Fill #0

## 2020-04-03 MED FILL — Heparin Sod (Porcine)-NaCl IV Soln 1000 Unit/500ML-0.9%: INTRAVENOUS | Qty: 1000 | Status: AC

## 2020-04-03 MED FILL — Fentanyl Citrate Preservative Free (PF) Inj 100 MCG/2ML: INTRAMUSCULAR | Qty: 2 | Status: AC

## 2020-04-03 NOTE — Progress Notes (Signed)
  Mobility Specialist Criteria Algorithm Info.  Mobility Team: [facility] elevated:Self regulated Activity: Ambulated in hall;Dangled on edge of bed Range of motion: Active;All extremities Level of assistance: Modified independent, requires aide device or extra time Assistive device: Front wheel walker Minutes sitting in chair:  Minutes stood: 3 minutes Minutes ambulated: 3 minutes Distance ambulated (ft): 100 ft Mobility response: Tolerated well Bed Position: Semi-fowlers  Pt tolerated ambulation well w/o any complaints.   04/03/2020 9:09 AM

## 2020-04-03 NOTE — Progress Notes (Signed)
Discharge instructions given to patient he has been instructed call for his ride. He is waiting on meds from [facility] pharmacy for Elequis at present. His PIV's have been removed with cath intact. No further changes noted.

## 2020-04-03 NOTE — Progress Notes (Addendum)
Progress Note  Patient Name: Albert Powell Date of Encounter: 04/03/2020  Primary Cardiologist: Nanetta Batty, MD  Subjective   No chest pain or shortness of breath overnight.  Maintaining sinus rhythm.  Eager to go home.  Inpatient Medications    Scheduled Meds: . amLODipine  10 mg Oral Daily  . apixaban  5 mg Oral BID  . aspirin  81 mg Oral Daily  . atorvastatin  40 mg Oral QHS  . clopidogrel  75 mg Oral Daily  . clopidogrel  75 mg Oral Q breakfast  . docusate sodium  100 mg Oral BID  . escitalopram  20 mg Oral Daily  . famotidine  20 mg Oral QODAY  . insulin aspart  0-15 Units Subcutaneous TID WC  . insulin aspart  0-5 Units Subcutaneous QHS  . isosorbide mononitrate  90 mg Oral Daily  . metoprolol tartrate  25 mg Oral BID  . mometasone-formoterol  2 puff Inhalation BID  . nicotine  14 mg Transdermal Daily  . nitroGLYCERIN  1 inch Topical Q6H  . pantoprazole  40 mg Oral Daily  . sodium chloride flush  3 mL Intravenous Q12H  . sodium chloride flush  3 mL Intravenous Q12H   Continuous Infusions: . sodium chloride     PRN Meds: sodium chloride, acetaminophen, bisacodyl, hydrALAZINE, HYDROcodone-acetaminophen, metoprolol tartrate, morphine injection, ondansetron (ZOFRAN) IV, ondansetron **OR** [DISCONTINUED] ondansetron (ZOFRAN) IV, polyethylene glycol, polyvinyl alcohol, sodium chloride flush, zolpidem   Vital Signs    Vitals:   04/02/20 1645 04/02/20 2025 04/02/20 2115 04/03/20 0426  BP: (!) 139/53 (!) 144/67  (!) 147/67  Pulse: (!) 51 (!) 52  62  Resp:  18  18  Temp:  98.3 F (36.8 C)  98 F (36.7 C)  TempSrc:  Oral  Oral  SpO2:  98% 99% 98%  Weight:    105.4 kg  Height:        Intake/Output Summary (Last 24 hours) at 04/03/2020 0817 Last data filed at 04/03/2020 3419 Gross per 24 hour  Intake 2042.25 ml  Output 1200 ml  Net 842.25 ml   Filed Weights   04/01/20 0406 04/02/20 0349 04/03/20 0426  Weight: 104 kg 104.2 kg 105.4 kg    Physical  Exam   GEN: Obese, in no acute distress.  HEENT: Grossly normal.  Neck: Supple, obese, difficult to gauge JVP.  No carotid bruits, or masses. Cardiac: RRR, no murmurs, rubs, or gallops. No clubbing, cyanosis, edema.  Radials/DP/PT 2+ and equal bilaterally.  Right radial catheterization site without bleeding, bruit, or hematoma. Respiratory:  Respirations regular and unlabored, clear to auscultation bilaterally. GI: Obese, soft, nontender, nondistended, BS + x 4. MS: no deformity or atrophy. Skin: warm and dry, no rash. Neuro:  Strength and sensation are intact. Psych: AAOx3.  Normal affect.  Labs    Chemistry Recent Labs  Lab 03/30/20 1230 03/30/20 1230 04/01/20 0621 04/02/20 0530 04/03/20 0608  NA 137   < > 137 137 140  K 3.8   < > 3.3* 2.9* 4.5  CL 97*   < > 96* 101 108  CO2 23   < > 30 27 24   GLUCOSE 179*   < > 105* 109* 97  BUN 16   < > 24* 24* 15  CREATININE 1.51*   < > 1.91* 1.66* 1.40*  CALCIUM 9.6   < > 9.5 8.7* 8.8*  PROT 7.1  --   --   --   --   ALBUMIN 4.0  --   --   --   --  AST 25  --   --   --   --   ALT 35  --   --   --   --   ALKPHOS 76  --   --   --   --   BILITOT 0.9  --   --   --   --   GFRNONAA 44*   < > 33* 39* 48*  GFRAA 51*   < > 38* 45* 55*  ANIONGAP 17*   < > 11 9 8    < > = values in this interval not displayed.     Hematology Recent Labs  Lab 04/01/20 0621 04/02/20 0530 04/03/20 0608  WBC 7.3 7.1 6.4  RBC 4.26 3.75* 3.64*  HGB 12.6* 11.1* 10.9*  HCT 38.2* 33.7* 33.4*  MCV 89.7 89.9 91.8  MCH 29.6 29.6 29.9  MCHC 33.0 32.9 32.6  RDW 13.7 13.6 14.0  PLT 223 191 181    Cardiac Enzymes  Recent Labs  Lab 03/30/20 1230 03/30/20 1524  TROPONINIHS 17 17      BNP Recent Labs  Lab 03/30/20 1251  BNP 199.3*    Lipids  Lab Results  Component Value Date   CHOL 122 03/30/2020   HDL 53 03/30/2020   LDLCALC 52 03/30/2020   TRIG 85 03/30/2020   CHOLHDL 2.3 03/30/2020    HbA1c  Lab Results  Component Value Date   HGBA1C  6.7 (H) 03/30/2020    Radiology    CT HEAD WO CONTRAST  Result Date: 03/30/2020 CLINICAL DATA:  Transient ischemic attack (TIA). Additional provided: New onset atrial fibrillation, EXAM: CT HEAD WITHOUT CONTRAST TECHNIQUE: Contiguous axial images were obtained from the base of the skull through the vertex without intravenous contrast. COMPARISON:  Head CT 03/27/2017 FINDINGS: Brain: Stable, moderate generalized parenchymal atrophy. Stable, mild ill-defined hypoattenuation within the cerebral white matter is nonspecific, but consistent with chronic small vessel ischemic disease. Redemonstrated small chronic lacunar infarct within the right caudate nucleus (series 2, image 21). There is no acute intracranial hemorrhage. No demarcated cortical infarct is identified. No extra-axial fluid collection. No evidence of intracranial mass. No midline shift. Vascular: No hyperdense vessel.  Atherosclerotic calcifications. Skull: Normal. Negative for fracture or focal lesion. Sinuses/Orbits: Visualized orbits show no acute finding. Mild ethmoid sinus mucosal thickening. Small amount of frothy secretions within the right maxillary sinus. No significant mastoid effusion. IMPRESSION: No CT evidence of acute intracranial abnormality. Moderate generalized parenchymal atrophy and mild chronic small vessel ischemic disease, stable as compared to the head CT of 03/27/2017. Redemonstrated small chronic right basal ganglia lacunar infarct. Paranasal sinus disease as described. Electronically Signed   By: 03/29/2017 DO   On: 03/30/2020 18:55   DG Chest Port 1 View  Result Date: 03/30/2020 CLINICAL DATA:  Chest pain. EXAM: PORTABLE CHEST 1 VIEW COMPARISON:  May 31, 2013. FINDINGS: Stable cardiomediastinal silhouette. No pneumothorax or pleural effusion is noted. No acute pulmonary disease is noted. Bony thorax is unremarkable. IMPRESSION: No active disease. Aortic Atherosclerosis (ICD10-I70.0). Electronically Signed   By:  June 02, 2013 M.D.   On: 03/30/2020 13:58    Telemetry    Sinus rhythm/sinus brady - 50's to 60's, rare pvc - Personally Reviewed  Cardiac Studies   2D Echocardiogram 7.16.2021  1. Left ventricular ejection fraction, by estimation, is 55 to 60%. The  left ventricle has normal function. The left ventricle has no regional  wall motion abnormalities. Left ventricular diastolic parameters are  consistent with Grade I diastolic  dysfunction (impaired relaxation).  2. Right ventricular systolic function is normal. The right ventricular  size is normal.  3. The mitral valve is normal in structure. No evidence of mitral valve  regurgitation. No evidence of mitral stenosis.  4. The aortic valve is tricuspid. Aortic valve regurgitation is not  visualized. Mild aortic valve sclerosis is present, with no evidence of  aortic valve stenosis.  5. The inferior vena cava is normal in size with greater than 50%  respiratory variability, suggesting right atrial pressure of 3 mmHg.  _____________  Cardiac Catheterization  7.19.2021  Left Anterior Descending  Prox LAD to Mid LAD lesion is 50% stenosed.  Left Circumflex  Prox Cx to Mid Cx lesion is 40% stenosed.  Right Coronary Artery  Previously placed Ost RCA to Prox RCA stent (unknown type) is widely patent.  Prox RCA to Mid RCA lesion is 70% stenosed.  _____________   Patient Profile     79 y.o. male w/ a h/o CAD s/p stenting to the RCA x2 in 2003 with nonischemic stress testing in 2014, carotid arterial disease status post left carotid endarterectomy in August 2014, PAD status post bilateral iliac stenting in July 2019, hypertension, hyperlipidemia, OSA (untreated), and type 2 diabetes mellitus, presented with A. fib with RVR and chest pain on July 16.  Cath 7/19 w/ stable, nonobs dzs and patent RCA stent.  Assessment & Plan    1.  Paroxysmal atrial fibrillation with rapid ventricular spots: Presented on July 16 with chest pain,  nausea, vomiting, and new diagnosis of atrial fibrillation.  He subsequently converted to sinus rhythm and has maintained sinus rhythm on oral beta-blocker therapy.  No significant bradycardia.  He has been transitioned to Eliquis with CHA2DS2-VASc of 5.  He is on chronic Plavix at home.  I will discontinue aspirin.  Follow-up scheduled in A. fib clinic next week.  2.  Chest pain/CAD: Prior RCA stenting in 2003 with nonischemic Myoview in 2014.  He presented with chest pain in the setting of rapid atrial fibrillation.  Troponins normal.  Echo with normal EF.  Diagnostic catheterization yesterday showed stable, nonobstructive disease with patent RCA stent.  Continue medical therapy recommended.  As above, he has been on chronic Plavix therapy.  He is not taking aspirin previously at home in the setting of initiation of Eliquis, I will discontinue this here.  Continue statin, beta-blocker, and nitrate therapy.  3.  Essential hypertension: Blood pressure is better overnight though still not ideal.  He has not required any as needed hydralazine.  With stable renal function post cath, would plan to resume losartan-HCTZ with early outpatient follow-up labs.  Continue amlodipine, beta-blocker, and nitrate therapy at current doses.  4.  Hyperlipidemia: LDL of 52.  Continue statin therapy.  5.  Type 2 diabetes mellitus: A1c 6.7.  Continue insulin therapy.  6.  Hypokalemia: Potassium normal at 4.5 this morning.    7.  Acute kidney injury/CKD 3: Creatinine 1.45 this morning following hydration yesterday.  He was on a losartan-HCTZ at home and recommend resumption of at least losartan for renal protection with early outpatient follow-up labs.  8.  Peripheral arterial disease: Status post bilateral iliac stenting in July 2019 with stable ABIs in 2020 (right: 0.76, left: 0.7).  Will need outpatient follow-up.  Continue aspirin and statin therapy.  9.  Carotid arterial disease: Status post prior endarterectomy on  the left.  1 to 39% bilateral stenoses by ultrasound May 2019.  Continue Plavix and statin therapy.  10.  Obstructive sleep apnea: Previously diagnosed but did not tolerate mask.  We discussed risk of recurrent A. fib with untreated sleep apnea yesterday.  He be willing to repeat sleep testing as an outpatient and try nasal prongs.  Signed, Nicolasa Ducking, NP  04/03/2020, 8:17 AM    For questions or updates, please contact   Please consult [email or web address] for contact info under Cardiology/STEMI.   The patient was seen, examined and discussed with Ward Givens, NP and agree as above.  The patient is in sinus rhythm, he should continue to take Eliquis and Plavix, aspirin should be discontinued.  He remains slightly hypertensive, I would resume his home losartan/hydrochlorothiazide combination.  His creatinine has improved 1.91->1.66->1.4.  Hypokalemia has resolved with potassium replacement.  The patient can be discharged home today.  To appointment scheduled for him with EP clinic on July 26 and with general cardiology on August 11.  Tobias Alexander, MD 04/03/2020

## 2020-04-03 NOTE — Discharge Summary (Signed)
Physician Discharge Summary  Albert Powell ZOX:[id number] DOB: 03-15-1941 DOA: 03/30/2020  PCP: [name], MD  Admit date: 03/30/2020 Discharge date: 04/03/2020  Admitted From: Home Disposition:  Home  Recommendations for Outpatient Follow-up:  1. Follow up with PCP in 1-2 weeks 2. Please obtain BMP/CBC in one week   Home Health:No Quipment/Devices:None  Discharge Condition:Stable CODE STATUS:Full Diet recommendation: Heart Healthy  Brief/Interim Summary: 79 y.o. male past medical history significant for essential hypertension hyperlipidemia CAD and chest pain which developed nausea vomiting and sweating profusely while walking to the car, later developed substernal chest pain and jaw pain, EMS was called she was found in A. fib.  The ED was found to be in new onset A. fib with RVR started on Lopressor and heparin cardiac biomarkers negative.  Discharge Diagnoses:  Principal Problem:   New onset atrial fibrillation [facility]) Active Problems:   DM (diabetes mellitus) ([name])   CAD hx of 2 stents to RCA in 2003,  80-90% stenosis of LAD/CFX June 2014- medical Rx   Hyperlipidemia   Tobacco abuse   Obesity (BMI 30-39.9)- negative sleep study in the past   Claudication of right lower extremity ([name])   Benign essential hypertension   ACS (acute coronary syndrome) ([name])   AKI (acute kidney injury) ([name])  New onset atrial fibrillation with RVR: He was started on oral metoprolol and IV heparin. Cardiac biomarkers remain negative. He status post cardiac cath on 04/02/2020 that showed widely open stent to the RCA 70% stenosis to the mid RCA, proximal LAD and mid LAD 50% stenosis circumflex, 40% stenosis. Likely his chest pain was due to A. fib with RVR follow-up with cardiology in 2 weeks.  He is currently rate controlled metoprolol and will continue Eliquis as an outpatient.  CAD with a history of 2 stents to the RCA: Cardiac cath and 04/02/2020 as above, continue  Plavix.  Hypertensive urgency: No changes made to his medication they were briefly held during his hospital stay, but he will resume them as an outpatient.  Acute kidney injury: Likely prerenal azotemia resolved with IV fluid hydration.  Hypokalemia: Replete orally now resolved.  Hyperlipidemia:  Continue Lipitor.  Diabetes mellitus type 2 with peripheral vascular disease: With an A1c of 6.7 no changes made to his medication.  Tobacco dependence: Counseling was performed.  Obesity: Counseling was performed.  Discharge Instructions  Discharge Instructions    Amb referral to AFIB Clinic   Complete by: As directed    Diet - low sodium heart healthy   Complete by: As directed    Increase activity slowly   Complete by: As directed      Allergies as of 04/03/2020      Reactions   Fentanyl Other (See Comments)   Behavioral changes   Gabapentin Other (See Comments)   ankle swells   Lisinopril Other (See Comments)   unknown   Lyrica [pregabalin] Other (See Comments)   unknown   Metformin Diarrhea   Propofol Other (See Comments)   Heart rate dropped   Shellfish Allergy Other (See Comments)   Other Other-Mild skin reaction to shrimp      Medication List    STOP taking these medications   metoprolol succinate 25 MG 24 hr tablet Commonly known as: TOPROL-XL     TAKE these medications   amLODipine 10 MG tablet Commonly known as: NORVASC Take 10 mg by mouth at bedtime.   apixaban 5 MG Tabs tablet Commonly known as: ELIQUIS Take 1 tablet (5 mg  total) by mouth 2 (two) times daily.   atorvastatin 40 MG tablet Commonly known as: LIPITOR Take 40 mg by mouth at bedtime.   clopidogrel 75 MG tablet Commonly known as: PLAVIX TAKE 1 TABLET (75 MG TOTAL) BY MOUTH DAILY. What changed: when to take this   escitalopram 20 MG tablet Commonly known as: LEXAPRO Take 20 mg by mouth daily.   famotidine 20 MG tablet Commonly known as: PEPCID Take 20 mg by mouth every  other day.   Fluticasone-Salmeterol 100-50 MCG/DOSE Aepb Commonly known as: ADVAIR Inhale 1 puff into the lungs daily.   glipiZIDE 5 MG tablet Commonly known as: GLUCOTROL Take 5 mg by mouth daily at 12 noon.   hydroxypropyl methylcellulose / hypromellose 2.5 % ophthalmic solution Commonly known as: ISOPTO TEARS / GONIOVISC Place 1 drop into both eyes daily as needed for dry eyes.   isosorbide mononitrate 60 MG 24 hr tablet Commonly known as: IMDUR Take 1 tablet (60 mg total) by mouth daily.   losartan-hydrochlorothiazide 100-25 MG tablet Commonly known as: HYZAAR Take 1 tablet by mouth at bedtime.   metoprolol tartrate 25 MG tablet Commonly known as: LOPRESSOR Take 1 tablet (25 mg total) by mouth 2 (two) times daily.   Ozempic (1 MG/DOSE) 2 MG/1.5ML Sopn Generic drug: Semaglutide (1 MG/DOSE) Inject 2 mg into the skin once a week.   pantoprazole 40 MG tablet Commonly known as: PROTONIX Take 40 mg by mouth every other day.       Follow-up Information    Newman Nip, NP Follow up.   Specialties: Nurse Practitioner, Cardiology Why: Atrial Fib Clinic at [facility] - a follow-up appointment has been made for you to establish care in our afib clinic on April 09, 2020 at 11:00 AM. Please arrive 15 minutes early to check in. Call clinic day of appointment for garage code. Contact information: [address] [place] [address] [phone]              Allergies  Allergen Reactions  . Fentanyl Other (See Comments)    Behavioral changes  . Gabapentin Other (See Comments)    ankle swells  . Lisinopril Other (See Comments)    unknown  . Lyrica [Pregabalin] Other (See Comments)    unknown  . Metformin Diarrhea  . Propofol Other (See Comments)    Heart rate dropped  . Shellfish Allergy Other (See Comments)    Other Other-Mild skin reaction to shrimp    Consultations:  Cardiology    Procedures/Studies: CT HEAD WO CONTRAST  Result Date:  03/30/2020 CLINICAL DATA:  Transient ischemic attack (TIA). Additional provided: New onset atrial fibrillation, EXAM: CT HEAD WITHOUT CONTRAST TECHNIQUE: Contiguous axial images were obtained from the base of the skull through the vertex without intravenous contrast. COMPARISON:  Head CT 03/27/2017 FINDINGS: Brain: Stable, moderate generalized parenchymal atrophy. Stable, mild ill-defined hypoattenuation within the cerebral white matter is nonspecific, but consistent with chronic small vessel ischemic disease. Redemonstrated small chronic lacunar infarct within the right caudate nucleus (series 2, image 21). There is no acute intracranial hemorrhage. No demarcated cortical infarct is identified. No extra-axial fluid collection. No evidence of intracranial mass. No midline shift. Vascular: No hyperdense vessel.  Atherosclerotic calcifications. Skull: Normal. Negative for fracture or focal lesion. Sinuses/Orbits: Visualized orbits show no acute finding. Mild ethmoid sinus mucosal thickening. Small amount of frothy secretions within the right maxillary sinus. No significant mastoid effusion. IMPRESSION: No CT evidence of acute intracranial abnormality. Moderate generalized parenchymal atrophy  and mild chronic small vessel ischemic disease, stable as compared to the head CT of 03/27/2017. Redemonstrated small chronic right basal ganglia lacunar infarct. Paranasal sinus disease as described. Electronically Signed   By: Jackey Loge DO   On: 03/30/2020 18:55   CARDIAC CATHETERIZATION  Result Date: 04/02/2020  Previously placed Ost RCA to Prox RCA stent (unknown type) is widely patent.  Prox RCA to Mid RCA lesion is 70% stenosed.  Prox LAD to Mid LAD lesion is 50% stenosed.  Prox Cx to Mid Cx lesion is 40% stenosed.  Albert Powell is a 79 y.o. male  [id number] LOCATION:  FACILITY: [facility] PHYSICIAN: Nanetta Batty, M.D. 04-23-1941 DATE OF PROCEDURE:  04/02/2020 DATE OF DISCHARGE: CARDIAC CATHETERIZATION History  obtained from chart review.79 y.o. male w/ a h/o CAD s/p stenting to the RCA x2 in 2003 with nonischemic stress testing in 2014, carotid arterial disease status post left carotid endarterectomy in August 2014, PAD status post bilateral iliac stenting in July 2019, hypertension, hyperlipidemia, OSA (untreated), and type 2 diabetes mellitus, presented with A. fib with RVR and chest pain on July 16.  His enzymes were negative.  His EKG showed no acute changes.  His LV function was normal.  He presents now for diagnostic coronary angiography to rule out an ischemic etiology. PROCEDURE DESCRIPTION: The patient was brought to the second floor [place] [facility]iac cath lab in the postabsorptive state. He was premedicated with IV Versed. His right wrist was prepped and shaved in usual sterile fashion. Xylocaine 1% was used for local anesthesia. A 6 French sheath was inserted into the right radial  artery using standard Seldinger technique. The patient received 5000 units  of heparin intravenously.  A 5 [place] TIG catheter pigtail catheter used for selective coronary angiography and obtain left heart pressures.  Isovue dye was used for the entirety of the case.  Retrograde aorta, ventricular and pullback pressures were recorded.  Radial cocktail was administered via the SideArm sheath. Because of the intermediate nature of the mid dominant RCA stenosis just beyond the previous stent I elected to perform DFR.  The patient received an additional 6 units of heparin with an ACT greater than 250.  Using a 6 [place] JR4 guide catheter along with a comet DFR wire I equalized, across the lesion and the DFR was 0.96 suggesting the lesion was not physiologically significant.   Mr. Degrazia has a patent proximal RCA stent with an intermediate lesion in the 70% range with negative DFR just beyond this in the mid RCA.  Otherwise he has noncritical CAD.  I suspect his chest pain was related to his A. fib with RVR.  Enzymes were negative.   Medical therapy will be recommended including a DOAC for his A. fib.  The sheath was removed and a TR band was placed on the right wrist to achieve patent hemostasis.  The patient left lab in stable condition. Nanetta Batty. MD, [facility] 04/02/2020 1:59 PM   DG Chest Port 1 View  Result Date: 03/30/2020 CLINICAL DATA:  Chest pain. EXAM: PORTABLE CHEST 1 VIEW COMPARISON:  May 31, 2013. FINDINGS: Stable cardiomediastinal silhouette. No pneumothorax or pleural effusion is noted. No acute pulmonary disease is noted. Bony thorax is unremarkable. IMPRESSION: No active disease. Aortic Atherosclerosis (ICD10-I70.0). Electronically Signed   By: Lupita Raider M.D.   On: 03/30/2020 13:58   ECHOCARDIOGRAM COMPLETE  Result Date: 03/30/2020    ECHOCARDIOGRAM REPORT   Patient Name:   Albert Powell Date of  Exam: 03/30/2020 Medical Rec #:  [id number]        Height:       69.0 in Accession #:    [id number]       Weight:       235.0 lb Date of Birth:  1941-03-15        BSA:          2.213 m Patient Age:    79 years         BP:           156/95 mmHg Patient Gender: M                HR:           105 bpm. Exam Location:  Inpatient Procedure: 2D Echo, Cardiac Doppler and Color Doppler Indications:    R07.9* Chest pain, unspecified  History:        Patient has no prior history of Echocardiogram examinations.                 CAD, Carotid Disease and COPD, Arrythmias:Atrial Fibrillation;                 Risk Factors:Hypertension, Diabetes, Dyslipidemia and Former                 Smoker.  Sonographer:    Elmarie Shiley Dance Referring Phys: [phone] JILL D MCDANIEL IMPRESSIONS  1. Left ventricular ejection fraction, by estimation, is 55 to 60%. The left ventricle has normal function. The left ventricle has no regional wall motion abnormalities. Left ventricular diastolic parameters are consistent with Grade I diastolic dysfunction (impaired relaxation).  2. Right ventricular systolic function is normal. The right ventricular size is  normal.  3. The mitral valve is normal in structure. No evidence of mitral valve regurgitation. No evidence of mitral stenosis.  4. The aortic valve is tricuspid. Aortic valve regurgitation is not visualized. Mild aortic valve sclerosis is present, with no evidence of aortic valve stenosis.  5. The inferior vena cava is normal in size with greater than 50% respiratory variability, suggesting right atrial pressure of 3 mmHg. FINDINGS  Left Ventricle: Left ventricular ejection fraction, by estimation, is 55 to 60%. The left ventricle has normal function. The left ventricle has no regional wall motion abnormalities. The left ventricular internal cavity size was normal in size. There is  no left ventricular hypertrophy. Left ventricular diastolic parameters are consistent with Grade I diastolic dysfunction (impaired relaxation). Right Ventricle: The right ventricular size is normal. No increase in right ventricular wall thickness. Right ventricular systolic function is normal. Left Atrium: Left atrial size was normal in size. Right Atrium: Right atrial size was normal in size. Pericardium: There is no evidence of pericardial effusion. Mitral Valve: The mitral valve is normal in structure. There is mild calcification of the mitral valve leaflet(s). Normal mobility of the mitral valve leaflets. Mild mitral annular calcification. No evidence of mitral valve regurgitation. No evidence of mitral valve stenosis. Tricuspid Valve: The tricuspid valve is normal in structure. Tricuspid valve regurgitation is not demonstrated. No evidence of tricuspid stenosis. Aortic Valve: The aortic valve is tricuspid. Aortic valve regurgitation is not visualized. Mild aortic valve sclerosis is present, with no evidence of aortic valve stenosis. Pulmonic Valve: The pulmonic valve was normal in structure. Pulmonic valve regurgitation is not visualized. No evidence of pulmonic stenosis. Aorta: The aortic root is normal in size and structure.  Venous: The inferior vena cava is normal in size with greater than  50% respiratory variability, suggesting right atrial pressure of 3 mmHg. IAS/Shunts: No atrial level shunt detected by color flow Doppler.  LEFT VENTRICLE PLAX 2D LVIDd:         4.20 cm LVIDs:         3.60 cm LV PW:         1.00 cm LV IVS:        1.00 cm LVOT diam:     2.30 cm LV SV:         69 LV SV Index:   31 LVOT Area:     4.15 cm  RIGHT VENTRICLE          IVC RV Basal diam:  2.00 cm  IVC diam: 1.30 cm TAPSE (M-mode): 1.5 cm LEFT ATRIUM             Index       RIGHT ATRIUM           Index LA diam:        3.60 cm 1.63 cm/m  RA Area:     15.40 cm LA Vol (A2C):   63.4 ml 28.65 ml/m RA Volume:   29.90 ml  13.51 ml/m LA Vol (A4C):   35.9 ml 16.23 ml/m LA Biplane Vol: 50.7 ml 22.91 ml/m  AORTIC VALVE LVOT Vmax:   96.30 cm/s LVOT Vmean:  59.800 cm/s LVOT VTI:    0.167 m  AORTA Ao Root diam: 3.70 cm Ao Asc diam:  3.30 cm MITRAL VALVE MV Area (PHT): 2.73 cm     SHUNTS MV Decel Time: 278 msec     Systemic VTI:  0.17 m MV E velocity: 71.60 cm/s   Systemic Diam: 2.30 cm MV A velocity: 110.00 cm/s MV E/A ratio:  0.65 Donato Schultz MD Electronically signed by Donato Schultz MD Signature Date/Time: 03/30/2020/4:31:02 PM    Final       Subjective:  No complaints feels great. Discharge Exam: Vitals:   04/02/20 2115 04/03/20 0426  BP:  (!) 147/67  Pulse:  62  Resp:  18  Temp:  98 F (36.7 C)  SpO2: 99% 98%   Vitals:   04/02/20 1645 04/02/20 2025 04/02/20 2115 04/03/20 0426  BP: (!) 139/53 (!) 144/67  (!) 147/67  Pulse: (!) 51 (!) 52  62  Resp:  18  18  Temp:  98.3 F (36.8 C)  98 F (36.7 C)  TempSrc:  Oral  Oral  SpO2:  98% 99% 98%  Weight:    105.4 kg  Height:        General: Pt is alert, awake, not in acute distress Cardiovascular: RRR, S1/S2 +, no rubs, no gallops Respiratory: CTA bilaterally, no wheezing, no rhonchi Abdominal: Soft, NT, ND, bowel sounds + Extremities: no edema, no cyanosis    The results of  significant diagnostics from this hospitalization (including imaging, microbiology, ancillary and laboratory) are listed below for reference.     Microbiology: Recent Results (from the past 240 hour(s))  SARS Coronavirus 2 by RT PCR (hospital order, performed in [facility] hospital lab) Nasopharyngeal Nasopharyngeal Swab     Status: None   Collection Time: 03/30/20  6:32 PM   Specimen: Nasopharyngeal Swab  Result Value Ref Range Status   SARS Coronavirus 2 NEGATIVE NEGATIVE Final    Comment: (NOTE) SARS-CoV-2 target nucleic acids are NOT DETECTED.  The SARS-CoV-2 RNA is generally detectable in upper and lower respiratory specimens during the acute phase of infection. The lowest concentration of SARS-CoV-2 viral copies  this assay can detect is 250 copies / mL. A negative result does not preclude SARS-CoV-2 infection and should not be used as the sole basis for treatment or other patient management decisions.  A negative result may occur with improper specimen collection / handling, submission of specimen other than nasopharyngeal swab, presence of viral mutation(s) within the areas targeted by this assay, and inadequate number of viral copies (<250 copies / mL). A negative result must be combined with clinical observations, patient history, and epidemiological information.  Fact Sheet for Patients:   BoilerBrush.com.cy  Fact Sheet for Healthcare Providers: https://pope.com/  This test is not yet approved or  cleared by the [place] FDA and has been authorized for detection and/or diagnosis of SARS-CoV-2 by FDA under an Emergency Use Authorization (EUA).  This EUA will remain in effect (meaning this test can be used) for the duration of the COVID-19 declaration under Section 564(b)(1) of the Act, 21 U.S.C. section 360bbb-3(b)(1), unless the authorization is terminated or revoked sooner.  Performed at [facility],  [address], [place], [place] [address]      Labs: BNP (last 3 results) Recent Labs    03/30/20 1251  BNP 199.3*   Basic Metabolic Panel: Recent Labs  Lab 03/30/20 1230 04/01/20 0621 04/02/20 0530 04/03/20 0608  NA 137 137 137 140  K 3.8 3.3* 2.9* 4.5  CL 97* 96* 101 108  CO2 23 30 27 24   GLUCOSE 179* 105* 109* 97  BUN 16 24* 24* 15  CREATININE 1.51* 1.91* 1.66* 1.40*  CALCIUM 9.6 9.5 8.7* 8.8*   Liver Function Tests: Recent Labs  Lab 03/30/20 1230  AST 25  ALT 35  ALKPHOS 76  BILITOT 0.9  PROT 7.1  ALBUMIN 4.0   Recent Labs  Lab 03/30/20 1230  LIPASE 41   No results for input(s): AMMONIA in the last 168 hours. CBC: Recent Labs  Lab 03/30/20 1230 03/31/20 0921 04/01/20 0621 04/02/20 0530 04/03/20 0608  WBC 12.3* 9.7 7.3 7.1 6.4  NEUTROABS 10.2*  --   --   --   --   HGB 14.8 12.4* 12.6* 11.1* 10.9*  HCT 44.7 37.2* 38.2* 33.7* 33.4*  MCV 89.4 91.0 89.7 89.9 91.8  PLT 252 247 223 191 181   Cardiac Enzymes: No results for input(s): CKTOTAL, CKMB, CKMBINDEX, TROPONINI in the last 168 hours. BNP: Invalid input(s): POCBNP CBG: Recent Labs  Lab 04/02/20 0607 04/02/20 1144 04/02/20 1614 04/02/20 2109 04/03/20 0605  GLUCAP 95 92 117* 134* 80   D-Dimer No results for input(s): DDIMER in the last 72 hours. Hgb A1c No results for input(s): HGBA1C in the last 72 hours. Lipid Profile No results for input(s): CHOL, HDL, LDLCALC, TRIG, CHOLHDL, LDLDIRECT in the last 72 hours. Thyroid function studies No results for input(s): TSH, T4TOTAL, T3FREE, THYROIDAB in the last 72 hours.  Invalid input(s): FREET3 Anemia work up No results for input(s): VITAMINB12, FOLATE, FERRITIN, TIBC, IRON, RETICCTPCT in the last 72 hours. Urinalysis    Component Value Date/Time   COLORURINE AMBER (A) 05/31/2013 1432   APPEARANCEUR CLEAR 05/31/2013 1432   LABSPEC 1.026 05/31/2013 1432   PHURINE 6.0 05/31/2013 1432   GLUCOSEU NEGATIVE 05/31/2013 1432   HGBUR NEGATIVE  05/31/2013 1432   BILIRUBINUR SMALL (A) 05/31/2013 1432   KETONESUR 15 (A) 05/31/2013 1432   PROTEINUR 30 (A) 05/31/2013 1432   UROBILINOGEN 1.0 05/31/2013 1432   NITRITE NEGATIVE 05/31/2013 1432   LEUKOCYTESUR NEGATIVE 05/31/2013 1432   Sepsis  Labs Invalid input(s): PROCALCITONIN,  WBC,  LACTICIDVEN Microbiology Recent Results (from the past 240 hour(s))  SARS Coronavirus 2 by RT PCR (hospital order, performed in [facility] hospital lab) Nasopharyngeal Nasopharyngeal Swab     Status: None   Collection Time: 03/30/20  6:32 PM   Specimen: Nasopharyngeal Swab  Result Value Ref Range Status   SARS Coronavirus 2 NEGATIVE NEGATIVE Final    Comment: (NOTE) SARS-CoV-2 target nucleic acids are NOT DETECTED.  The SARS-CoV-2 RNA is generally detectable in upper and lower respiratory specimens during the acute phase of infection. The lowest concentration of SARS-CoV-2 viral copies this assay can detect is 250 copies / mL. A negative result does not preclude SARS-CoV-2 infection and should not be used as the sole basis for treatment or other patient management decisions.  A negative result may occur with improper specimen collection / handling, submission of specimen other than nasopharyngeal swab, presence of viral mutation(s) within the areas targeted by this assay, and inadequate number of viral copies (<250 copies / mL). A negative result must be combined with clinical observations, patient history, and epidemiological information.  Fact Sheet for Patients:   BoilerBrush.com.cy  Fact Sheet for Healthcare Providers: https://pope.com/  This test is not yet approved or  cleared by the [place] FDA and has been authorized for detection and/or diagnosis of SARS-CoV-2 by FDA under an Emergency Use Authorization (EUA).  This EUA will remain in effect (meaning this test can be used) for the duration of the COVID-19 declaration under  Section 564(b)(1) of the Act, 21 U.S.C. section 360bbb-3(b)(1), unless the authorization is terminated or revoked sooner.  Performed at [facility], [address], [place], [place] [address]      Time coordinating discharge: Over 30 minutes  SIGNED:   Marinda Elk, MD  [facility] 04/03/2020, 8:37 AM Pager   If 7PM-7AM, please contact night-coverage [email or web address] Password [email or web address]

## 2020-04-03 NOTE — TOC Progression Note (Signed)
Transition of Care [facility]) - Progression Note    Patient Details  Name: Albert Powell MRN: [id number] Date of Birth: 1941/09/04  Transition of [facility] CM/SW Contact  Leone Haven, RN Phone Number: 04/03/2020, 9:22 AM  Clinical Narrative:    NCM spoke with patient, informed him what the refill copay is  And informed him MD will send the first 30 days to [facility] pharmacy to fill for him and they will bring to his room before he leaves.         Expected Discharge Plan and Services           Expected Discharge Date: 04/03/20                                     Social Determinants of Health (SDOH) Interventions    Readmission Risk Interventions No flowsheet data found.

## 2020-04-09 ENCOUNTER — Encounter (HOSPITAL_COMMUNITY): Payer: Self-pay | Admitting: Nurse Practitioner

## 2020-04-09 ENCOUNTER — Ambulatory Visit (HOSPITAL_COMMUNITY)
Admit: 2020-04-09 | Discharge: 2020-04-09 | Disposition: A | Discharge status: Home / Self Care | Payer: Medicare Other | Attending: Nurse Practitioner | Admitting: Nurse Practitioner

## 2020-04-09 ENCOUNTER — Other Ambulatory Visit: Payer: Self-pay

## 2020-04-09 VITALS — BP 160/74 | HR 65 | Ht 69.0 in | Wt 233.0 lb

## 2020-04-09 DIAGNOSIS — I4891 Unspecified atrial fibrillation: Secondary | ICD-10-CM | POA: Insufficient documentation

## 2020-04-09 DIAGNOSIS — Z79899 Other long term (current) drug therapy: Secondary | ICD-10-CM | POA: Insufficient documentation

## 2020-04-09 DIAGNOSIS — F17290 Nicotine dependence, other tobacco product, uncomplicated: Secondary | ICD-10-CM | POA: Insufficient documentation

## 2020-04-09 DIAGNOSIS — J449 Chronic obstructive pulmonary disease, unspecified: Secondary | ICD-10-CM | POA: Diagnosis not present

## 2020-04-09 DIAGNOSIS — E785 Hyperlipidemia, unspecified: Secondary | ICD-10-CM | POA: Insufficient documentation

## 2020-04-09 DIAGNOSIS — Z7902 Long term (current) use of antithrombotics/antiplatelets: Secondary | ICD-10-CM | POA: Insufficient documentation

## 2020-04-09 DIAGNOSIS — I491 Atrial premature depolarization: Secondary | ICD-10-CM | POA: Insufficient documentation

## 2020-04-09 DIAGNOSIS — D6869 Other thrombophilia: Secondary | ICD-10-CM

## 2020-04-09 DIAGNOSIS — Z7901 Long term (current) use of anticoagulants: Secondary | ICD-10-CM | POA: Insufficient documentation

## 2020-04-09 DIAGNOSIS — E119 Type 2 diabetes mellitus without complications: Secondary | ICD-10-CM | POA: Insufficient documentation

## 2020-04-09 DIAGNOSIS — I2510 Atherosclerotic heart disease of native coronary artery without angina pectoris: Secondary | ICD-10-CM | POA: Diagnosis not present

## 2020-04-09 DIAGNOSIS — Z955 Presence of coronary angioplasty implant and graft: Secondary | ICD-10-CM | POA: Insufficient documentation

## 2020-04-09 DIAGNOSIS — G4730 Sleep apnea, unspecified: Secondary | ICD-10-CM | POA: Insufficient documentation

## 2020-04-09 DIAGNOSIS — I10 Essential (primary) hypertension: Secondary | ICD-10-CM | POA: Diagnosis not present

## 2020-04-09 DIAGNOSIS — K219 Gastro-esophageal reflux disease without esophagitis: Secondary | ICD-10-CM | POA: Diagnosis not present

## 2020-04-09 DIAGNOSIS — Z7984 Long term (current) use of oral hypoglycemic drugs: Secondary | ICD-10-CM | POA: Diagnosis not present

## 2020-04-09 NOTE — Patient Instructions (Signed)
Metoprolol 25mg twice a day 

## 2020-04-09 NOTE — Progress Notes (Signed)
Primary Care Physician: [name], MD Referring Physician: [facility] f/u Cardiologist: Dr. Jordan Hawks Albert Powell is a 79 y.o. male with a h/o CAD, HTN, prior smoking history, currently vaping, PAD, that presented to [facility] ED 7/16 with chest pain, N/V and was found to have new onset AFib. HE was started on oral metoprolol and IV heparin. He converted to SR befor discharge. He also had a repeat cath which showed widly patent stent and other disease thought best to manage medically. He was continued on Plavix.   In the afib clinic, he is in SR with PC's. States that he feels weak. He denies alcohol but does vape, started several yrs back after smoking for 50 years,  drinks moderate caffeine and has untreated sleep apnea. Sedentary and overweight. He has bee taking his metoprolol tartrate 25 mg just once a day and his BP is elevated today at 160/74.   Today, he denies symptoms of palpitations, chest pain, shortness of breath, orthopnea, PND, lower extremity edema, dizziness, presyncope, syncope, or neurologic sequela. The patient is tolerating medications without difficulties and is otherwise without complaint today.   Past Medical History:  Diagnosis Date  . Atrial fibrillation ([name]) 03/30/2020  . Bradycardia, sinus 02/18/2013  . CAD (coronary artery disease)    stent to RCA 2003 also had 40% lesion at that time; NUCLEAR STRESS TEST, 01/16/2010 - normal  now with cath 80-90% stenosis in LAD, will try medical therapy if no improvement PCI  . Carotid artery disease ([name])   . Cataract   . Claudication ([name])    LEA DUPLEX, 07/07/2008 - Normal  . COPD (chronic obstructive pulmonary disease) ([name])   . DDD (degenerative disc disease) 2008  . Diabetes mellitus   . GERD (gastroesophageal reflux disease)   . H/O hiatal hernia   . History of colonoscopy 2004   finding of tics and AMV only no polpys  . Hyperlipidemia 02/05/2013  . Hypertension   . Onychomycosis   . Rosacea   . Tobacco abuse  02/05/2013   Past Surgical History:  Procedure Laterality Date  . ABDOMINAL AORTOGRAM W/LOWER EXTREMITY N/A 03/30/2018   Procedure: ABDOMINAL AORTOGRAM W/LOWER EXTREMITY;  Surgeon: Nada Libman, MD;  Location: [facility] INVASIVE CV LAB;  Service: Cardiovascular;  Laterality: N/A;  . APPENDECTOMY    . CARDIAC CATHETERIZATION  08/29/2002   2-vessel CAD with high-grade stenoses in RCA; stenting to RCA  . CARDIAC CATHETERIZATION  2014   80-90% lesion will try medical therapy  . CARDIAC SURGERY     stents  2003  . CARDIOVERSION N/A 11/01/2013   Procedure: Dimple Nanas COMPRESSION;  Surgeon: [name], MD;  Location: [facility] CATH LAB;  Service: Cardiovascular;  Laterality: N/A;  . CAROTID ANGIOGRAM N/A 04/25/2013   Procedure: CAROTID ANGIOGRAM;  Surgeon: [name], MD;  Location: [facility] CATH LAB;  Service: Cardiovascular;  Laterality: N/A;  . CAROTID ENDARTERECTOMY    . COLON RESECTION  3/04, 6/04    1 bleeding diverticulitis, 2 complete colectomy  . COLON SURGERY  2005   renal pouch rectal anastimosis  . CORONARY ANGIOPLASTY WITH STENT PLACEMENT  09/2002   2 stents to RCA  . ENDARTERECTOMY Left 06/02/2013   Procedure: ENDARTERECTOMY CAROTID-LEFT;  Surgeon: Nada Libman, MD;  Location: [facility] OR;  Service: Vascular;  Laterality: Left;  . INTRAVASCULAR PRESSURE WIRE/FFR STUDY N/A 04/02/2020   Procedure: INTRAVASCULAR PRESSURE WIRE/FFR STUDY;  Surgeon: [name], MD;  Location: [facility]VAS[facility] CV LAB;  Service: Cardiovascular;  Laterality: N/A;  DFR - RCA  . LEFT HEART CATH AND CORONARY ANGIOGRAPHY N/A 04/02/2020   Procedure: LEFT HEART CATH AND CORONARY ANGIOGRAPHY;  Surgeon: [name], MD;  Location: [facility]VAS[facility] CV LAB;  Service: Cardiovascular;  Laterality: N/A;  . LEFT HEART CATHETERIZATION WITH CORONARY ANGIOGRAM N/A 02/08/2013   Procedure: LEFT HEART CATHETERIZATION WITH CORONARY ANGIOGRAM;  Surgeon: [name], MD;  Location: [facility] CATH LAB;  Service: Cardiovascular;   Laterality: N/A;  . LOWER EXTREMITY ANGIOGRAM N/A 10/20/2013   Procedure: LOWER EXTREMITY ANGIOGRAM;  Surgeon: [name], MD;  Location: [facility] CATH LAB;  Service: Cardiovascular;  Laterality: N/A;  . PATCH ANGIOPLASTY Left 06/02/2013   Procedure: PATCH ANGIOPLASTY;  Surgeon: Nada Libman, MD;  Location: [facility] OR;  Service: Vascular;  Laterality: Left;  . PERIPHERAL VASCULAR INTERVENTION Bilateral 03/30/2018   Procedure: PERIPHERAL VASCULAR INTERVENTION;  Surgeon: Nada Libman, MD;  Location: [facility]VAS[facility] CV LAB;  Service: Cardiovascular;  Laterality: Bilateral;  common iliacs  . TONSILLECTOMY      Current Outpatient Medications  Medication Sig Dispense Refill  . amLODipine (NORVASC) 10 MG tablet Take 10 mg by mouth at bedtime.     [name] apixaban (ELIQUIS) 5 MG TABS tablet Take 1 tablet (5 mg total) by mouth 2 (two) times daily. 60 tablet 3  . atorvastatin (LIPITOR) 40 MG tablet Take 40 mg by mouth at bedtime.     . clopidogrel (PLAVIX) 75 MG tablet Take 1 tablet (75 mg total) by mouth daily. 90 tablet 2  . escitalopram (LEXAPRO) 20 MG tablet Take 20 mg by mouth daily.    . famotidine (PEPCID) 20 MG tablet Take 20 mg by mouth every other day.     . fluticasone (FLONASE) 50 MCG/ACT nasal spray fluticasone propionate 50 mcg/actuation nasal spray,suspension  SPRAY 1 SPRAY INTO EACH NOSTRIL EVERY DAY    . Fluticasone-Salmeterol (ADVAIR) 100-50 MCG/DOSE AEPB Inhale 1 puff into the lungs daily.     [name] glipiZIDE (GLUCOTROL) 5 MG tablet Take 5 mg by mouth daily at 12 noon.     . hydroxypropyl methylcellulose / hypromellose (ISOPTO TEARS / GONIOVISC) 2.5 % ophthalmic solution Place 1 drop into both eyes daily as needed for dry eyes.    . influenza vaccine adjuvanted (FLUAD QUADRIVALENT) 0.5 ML injection Fluad Quad 2020-2021(16yr up)(PF) 60 mcg (15 mcg x 4)/0.11mL IM syringe  PHARMACY ADMINISTERED    . isosorbide mononitrate (IMDUR) 60 MG 24 hr tablet Take 1 tablet (60 mg total) by mouth daily.    [name]  losartan-hydrochlorothiazide (HYZAAR) 100-25 MG per tablet Take 1 tablet by mouth at bedtime.     . metoprolol tartrate (LOPRESSOR) 25 MG tablet Take 1 tablet (25 mg total) by mouth 2 (two) times daily. 60 tablet 3  . OZEMPIC, 1 MG/DOSE, 2 MG/1.5ML SOPN Inject 2 mg into the skin once a week.     . pantoprazole (PROTONIX) 40 MG tablet Take 40 mg by mouth every other day.      No current facility-administered medications for this encounter.    Allergies  Allergen Reactions  . Fentanyl Other (See Comments)    Behavioral changes  . Gabapentin Other (See Comments)    ankle swells  . Lisinopril Other (See Comments)    unknown  . Lyrica [Pregabalin] Other (See Comments)    unknown  . Metformin Diarrhea  . Propofol Other (See Comments)    Heart rate dropped    Social History   Socioeconomic History  .  Marital status: Married    Spouse name: Not on file  . Number of children: Not on file  . Years of education: Not on file  . Highest education level: Not on file  Occupational History  . Occupation: retired  Tobacco Use  . Smoking status: Current Every Day Smoker    Packs/day: 0.75    Years: 57.00    Pack years: 42.75    Types: Cigarettes    Last attempt to quit: 04/06/2013    Years since quitting: 7.0  . Smokeless tobacco: Former Neurosurgeon    Quit date: 04/14/2013  . Tobacco comment: pt states that he is using the vapor cigs  Vaping Use  . Vaping Use: Never used  Substance and Sexual Activity  . Alcohol use: Yes    Alcohol/week: 1.0 standard drink    Types: 1 Glasses of wine per week    Comment: "very little"  . Drug use: No  . Sexual activity: Not on file  Other Topics Concern  . Not on file  Social History Narrative  . Not on file   Social Determinants of Health   Financial Resource Strain:   . Difficulty of Paying Living Expenses:   Food Insecurity:   . Worried About Programme researcher, broadcasting/film/video in the Last Year:   . Barista in the Last Year:   Transportation Needs:    . Freight forwarder (Medical):   [name] Lack of Transportation (Non-Medical):   Physical Activity:   . Days of Exercise per Week:   . Minutes of Exercise per Session:   Stress:   . Feeling of Stress :   Social Connections:   . Frequency of Communication with Friends and Family:   . Frequency of Social Gatherings with Friends and Family:   . Attends Religious Services:   . Active Member of Clubs or Organizations:   . Attends Banker Meetings:   [name] Marital Status:   Intimate Partner Violence:   . Fear of Current or Ex-Partner:   . Emotionally Abused:   [name] Physically Abused:   . Sexually Abused:     Family History  Problem Relation Age of Onset  . Heart disease Father        heart attack at 52  . Heart attack Father   . Hyperlipidemia Father   . Colonic polyp Mother        that bled out  . COPD Mother   . Diabetes Mother   . Heart disease Sister   . Colonic polyp Sister   . Stroke Maternal Grandfather   . Heart attack Paternal Grandfather   . Other Neg Hx        hypogonadism    ROS- All systems are reviewed and negative except as per the HPI above  Physical Exam: Vitals:   04/09/20 1053  BP: (!) 160/74  Pulse: 65  Weight: (!) 105.7 kg  Height: 5\' 9"  (1.753 m)   Wt Readings from Last 3 Encounters:  04/09/20 (!) 105.7 kg  04/03/20 105.4 kg  03/31/19 104.7 kg    Labs: Lab Results  Component Value Date   NA 140 04/03/2020   K 4.5 04/03/2020   CL 108 04/03/2020   CO2 24 04/03/2020   GLUCOSE 97 04/03/2020   BUN 15 04/03/2020   CREATININE 1.40 (H) 04/03/2020   CALCIUM 8.8 (L) 04/03/2020   Lab Results  Component Value Date   INR 1.0 03/30/2020   Lab Results  Component Value  Date   CHOL 122 03/30/2020   HDL 53 03/30/2020   LDLCALC 52 03/30/2020   TRIG 85 03/30/2020     GEN- The patient is well appearing, alert and oriented x 3 today.   Head- normocephalic, atraumatic Eyes-  Sclera clear, conjunctiva pink Ears- hearing  intact Oropharynx- clear Neck- supple, no JVP Lymph- no cervical lymphadenopathy Lungs- Clear to ausculation bilaterally, normal work of breathing Heart- Regular rate and rhythm, no murmurs, rubs or gallops, PMI not laterally displaced GI- soft, NT, ND, + BS Extremities- no clubbing, cyanosis, or edema MS- no significant deformity or atrophy Skin- no rash or lesion Psych- euthymic mood, full affect Neuro- strength and sensation are intact  EKG-sinus rhythm at 65 bpm, PAC's, PVC's, pr int 208 ms, qrs int 68 ms. qtc 411 ms  Epic records reviewed   LHC-Previously placed Ost RCA to Prox RCA stent (unknown type) is widely patent.  Prox RCA to Mid RCA lesion is 70% stenosed.  Prox LAD to Mid LAD lesion is 50% stenosed.  Prox Cx to Mid Cx lesion is 40% stenosed.   Assessment and Plan: 1. Afib  New onset  Now back in SR General discussion re afib Increase metoprolol tartrate to 25 mg bid as he has beent aking only obnce a day  2. Lifestyle issues Decrease caffeine intake Stop vaping  States that he has sleep apnea but cannot tolerate mask He could try an oral appliance thru oral surgeon  Althea Grimmer  3. CAD No further chest pain  continue plavix  Per Dr. Allyson Sabal   4. [id number] score of at least 6 Continue  eliquis 5 mg bid  Is at highter bleeding risk with plavix on board Bleeding risk discussed  F/u with Johnella Moloney, PA  8/11 Afib clinic as needed  Elvina Sidle. [facility] [address] [place], [place] [address] [phone]

## 2020-04-10 DIAGNOSIS — I2510 Atherosclerotic heart disease of native coronary artery without angina pectoris: Secondary | ICD-10-CM | POA: Diagnosis not present

## 2020-04-10 DIAGNOSIS — K219 Gastro-esophageal reflux disease without esophagitis: Secondary | ICD-10-CM | POA: Diagnosis not present

## 2020-04-10 DIAGNOSIS — I10 Essential (primary) hypertension: Secondary | ICD-10-CM | POA: Diagnosis not present

## 2020-04-10 DIAGNOSIS — I4891 Unspecified atrial fibrillation: Secondary | ICD-10-CM | POA: Diagnosis not present

## 2020-04-25 ENCOUNTER — Encounter: Payer: Self-pay | Admitting: Physician Assistant

## 2020-04-25 ENCOUNTER — Other Ambulatory Visit: Payer: Self-pay

## 2020-04-25 ENCOUNTER — Ambulatory Visit (INDEPENDENT_AMBULATORY_CARE_PROVIDER_SITE_OTHER): Payer: Medicare Other | Admitting: Physician Assistant

## 2020-04-25 VITALS — BP 120/72 | HR 67 | Temp 97.2°F | Ht 69.0 in | Wt 230.8 lb

## 2020-04-25 DIAGNOSIS — E119 Type 2 diabetes mellitus without complications: Secondary | ICD-10-CM | POA: Diagnosis not present

## 2020-04-25 DIAGNOSIS — E782 Mixed hyperlipidemia: Secondary | ICD-10-CM | POA: Diagnosis not present

## 2020-04-25 DIAGNOSIS — I2510 Atherosclerotic heart disease of native coronary artery without angina pectoris: Secondary | ICD-10-CM

## 2020-04-25 DIAGNOSIS — I739 Peripheral vascular disease, unspecified: Secondary | ICD-10-CM | POA: Diagnosis not present

## 2020-04-25 DIAGNOSIS — I249 Acute ischemic heart disease, unspecified: Secondary | ICD-10-CM

## 2020-04-25 DIAGNOSIS — I6522 Occlusion and stenosis of left carotid artery: Secondary | ICD-10-CM

## 2020-04-25 DIAGNOSIS — I10 Essential (primary) hypertension: Secondary | ICD-10-CM | POA: Diagnosis not present

## 2020-04-25 DIAGNOSIS — I480 Paroxysmal atrial fibrillation: Secondary | ICD-10-CM | POA: Diagnosis not present

## 2020-04-25 DIAGNOSIS — Z79899 Other long term (current) drug therapy: Secondary | ICD-10-CM

## 2020-04-25 LAB — BASIC METABOLIC PANEL
BUN/Creatinine Ratio: 12 (ref 10–24)
BUN: 17 mg/dL (ref 8–27)
CO2: 25 mmol/L (ref 20–29)
Calcium: 9.6 mg/dL (ref 8.6–10.2)
Chloride: 92 mmol/L — ABNORMAL LOW (ref 96–106)
Creatinine, Ser: 1.43 mg/dL — ABNORMAL HIGH (ref 0.76–1.27)
GFR calc Af Amer: 54 mL/min/{1.73_m2} — ABNORMAL LOW (ref >59)
GFR calc non Af Amer: 47 mL/min/{1.73_m2} — ABNORMAL LOW (ref >59)
Glucose: 101 mg/dL — ABNORMAL HIGH (ref 65–99)
Potassium: 4.1 mmol/L (ref 3.5–5.2)
Sodium: 134 mmol/L (ref 134–144)

## 2020-04-25 LAB — CBC
Hematocrit: 39.4 % (ref 37.5–51.0)
Hemoglobin: 13.4 g/dL (ref 13.0–17.7)
MCH: 30.3 pg (ref 26.6–33.0)
MCHC: 34 g/dL (ref 31.5–35.7)
MCV: 89 fL (ref 79–97)
Platelets: 252 10*3/uL (ref 150–450)
RBC: 4.42 x10E6/uL (ref 4.14–5.80)
RDW: 13.1 % (ref 11.6–15.4)
WBC: 10.2 10*3/uL (ref 3.4–10.8)

## 2020-04-25 NOTE — Progress Notes (Signed)
Cardiology Office Note:    Date:  04/27/2020   ID:  Marshall, Kampf [birth year], MRN [id number]  PCP:  [name], MD  [facility] Cardiologist:  Nanetta Batty, MD  [facility] Electrophysiologist:  None   Referring MD: [name],*   Chief Complaint  Patient presents with  . Follow-up    seen for Dr. Allyson Sabal    History of Present Illness:    Albert Powell is a 79 y.o. male with a hx of CAD, carotid artery disease s/p left enterectomy 04/2013, PAD s/p right external iliac stenting 10/2013, HTN, HLD, DM II and recently diagnosed PAF.  He had previous RCA stenting in 2003.  Myoview in 2014 was nonischemic.  Patient was recently admitted at [facility] after presenting with chest pain radiating to the jaw, nausea and vomiting.  EKG on arrival showed new onset atrial fibrillation.  He was hypertensive with systolic blood pressure up to 333 mmHg.  Patient underwent cardiac catheterization on 04/02/2020 which showed 70% proximal to mid RCA residual with negative DFR, 50% proximal to mid LAD, 40% proximal to mid left circumflex lesion, widely patent ostial to proximal RCA stent.  It was suspected his chest pain was due to atrial fibrillation.  He self converted to sinus rhythm during the hospitalization and was started on Eliquis for CHA2DS2-Vasc score of 5.  Aspirin was discontinued and he was discharged on combination of Plavix and Eliquis.  Since discharge, he has been seen by A. fib clinic on 7/26 at which time he was maintaining sinus rhythm.  Metoprolol tartrate was increased to 25 mg twice a day as patient has only been taking it once a day.  Patient presents today for cardiology office visit.  Blood pressure has normalized.  He denies any recent chest pain or shortness of breath.  EKG continue to show sinus rhythm.  I recommend a CBC and basic metabolic panel.  Otherwise he has no lower extremity edema, orthopnea or PND.  He can follow-up with Dr. Allyson Sabal  in 3 months.   Past Medical History:  Diagnosis Date  . Atrial fibrillation ([name]) 03/30/2020  . Bradycardia, sinus 02/18/2013  . CAD (coronary artery disease)    stent to RCA 2003 also had 40% lesion at that time; NUCLEAR STRESS TEST, 01/16/2010 - normal  now with cath 80-90% stenosis in LAD, will try medical therapy if no improvement PCI  . Carotid artery disease ([name])   . Cataract   . Claudication ([name])    LEA DUPLEX, 07/07/2008 - Normal  . COPD (chronic obstructive pulmonary disease) ([name])   . DDD (degenerative disc disease) 2008  . Diabetes mellitus   . GERD (gastroesophageal reflux disease)   . H/O hiatal hernia   . History of colonoscopy 2004   finding of tics and AMV only no polpys  . Hyperlipidemia 02/05/2013  . Hypertension   . Onychomycosis   . Rosacea   . Tobacco abuse 02/05/2013    Past Surgical History:  Procedure Laterality Date  . ABDOMINAL AORTOGRAM W/LOWER EXTREMITY N/A 03/30/2018   Procedure: ABDOMINAL AORTOGRAM W/LOWER EXTREMITY;  Surgeon: Nada Libman, MD;  Location: [facility] INVASIVE CV LAB;  Service: Cardiovascular;  Laterality: N/A;  . APPENDECTOMY    . CARDIAC CATHETERIZATION  08/29/2002   2-vessel CAD with high-grade stenoses in RCA; stenting to RCA  . CARDIAC CATHETERIZATION  2014   80-90% lesion will try medical therapy  . CARDIAC SURGERY     stents  2003  .  CARDIOVERSION N/A 11/01/2013   Procedure: Dimple Nanas COMPRESSION;  Surgeon: [name], MD;  Location: [facility] CATH LAB;  Service: Cardiovascular;  Laterality: N/A;  . CAROTID ANGIOGRAM N/A 04/25/2013   Procedure: CAROTID ANGIOGRAM;  Surgeon: [name], MD;  Location: [facility] CATH LAB;  Service: Cardiovascular;  Laterality: N/A;  . CAROTID ENDARTERECTOMY    . COLON RESECTION  3/04, 6/04    1 bleeding diverticulitis, 2 complete colectomy  . COLON SURGERY  2005   renal pouch rectal anastimosis  . CORONARY ANGIOPLASTY WITH STENT PLACEMENT  09/2002   2 stents to RCA  . ENDARTERECTOMY Left  06/02/2013   Procedure: ENDARTERECTOMY CAROTID-LEFT;  Surgeon: Nada Libman, MD;  Location: [facility] OR;  Service: Vascular;  Laterality: Left;  . INTRAVASCULAR PRESSURE WIRE/FFR STUDY N/A 04/02/2020   Procedure: INTRAVASCULAR PRESSURE WIRE/FFR STUDY;  Surgeon: [name], MD;  Location: [facility] INVASIVE CV LAB;  Service: Cardiovascular;  Laterality: N/A;  DFR - RCA  . LEFT HEART CATH AND CORONARY ANGIOGRAPHY N/A 04/02/2020   Procedure: LEFT HEART CATH AND CORONARY ANGIOGRAPHY;  Surgeon: [name], MD;  Location: [facility] INVASIVE CV LAB;  Service: Cardiovascular;  Laterality: N/A;  . LEFT HEART CATHETERIZATION WITH CORONARY ANGIOGRAM N/A 02/08/2013   Procedure: LEFT HEART CATHETERIZATION WITH CORONARY ANGIOGRAM;  Surgeon: [name], MD;  Location: [facility] CATH LAB;  Service: Cardiovascular;  Laterality: N/A;  . LOWER EXTREMITY ANGIOGRAM N/A 10/20/2013   Procedure: LOWER EXTREMITY ANGIOGRAM;  Surgeon: [name], MD;  Location: [facility] CATH LAB;  Service: Cardiovascular;  Laterality: N/A;  . PATCH ANGIOPLASTY Left 06/02/2013   Procedure: PATCH ANGIOPLASTY;  Surgeon: Nada Libman, MD;  Location: [facility] OR;  Service: Vascular;  Laterality: Left;  . PERIPHERAL VASCULAR INTERVENTION Bilateral 03/30/2018   Procedure: PERIPHERAL VASCULAR INTERVENTION;  Surgeon: Nada Libman, MD;  Location: [facility] INVASIVE CV LAB;  Service: Cardiovascular;  Laterality: Bilateral;  common iliacs  . TONSILLECTOMY      Current Medications: Current Meds  Medication Sig  . amLODipine (NORVASC) 10 MG tablet Take 10 mg by mouth at bedtime.   [name] apixaban (ELIQUIS) 5 MG TABS tablet Take 1 tablet (5 mg total) by mouth 2 (two) times daily.  [name] atorvastatin (LIPITOR) 40 MG tablet Take 40 mg by mouth at bedtime.   . clopidogrel (PLAVIX) 75 MG tablet Take 1 tablet (75 mg total) by mouth daily.  [name] escitalopram (LEXAPRO) 20 MG tablet Take 20 mg by mouth daily.  . famotidine (PEPCID) 20 MG tablet Take 20 mg by mouth every other day.   .  fluticasone (FLONASE) 50 MCG/ACT nasal spray fluticasone propionate 50 mcg/actuation nasal spray,suspension  SPRAY 1 SPRAY INTO EACH NOSTRIL EVERY DAY  . Fluticasone-Salmeterol (ADVAIR) 100-50 MCG/DOSE AEPB Inhale 1 puff into the lungs daily.   [name] glipiZIDE (GLUCOTROL) 5 MG tablet Take 5 mg by mouth daily at 12 noon.   . hydroxypropyl methylcellulose / hypromellose (ISOPTO TEARS / GONIOVISC) 2.5 % ophthalmic solution Place 1 drop into both eyes daily as needed for dry eyes.  . influenza vaccine adjuvanted (FLUAD QUADRIVALENT) 0.5 ML injection Fluad Quad 2020-2021(35yr up)(PF) 60 mcg (15 mcg x 4)/0.34mL IM syringe  PHARMACY ADMINISTERED  . losartan-hydrochlorothiazide (HYZAAR) 100-25 MG per tablet Take 1 tablet by mouth at bedtime.   . metoprolol tartrate (LOPRESSOR) 25 MG tablet Take 1 tablet (25 mg total) by mouth 2 (two) times daily.  [name] OZEMPIC, 1 MG/DOSE, 2 MG/1.5ML SOPN Inject 2 mg into the skin once a  week.   . pantoprazole (PROTONIX) 40 MG tablet Take 40 mg by mouth every other day.   . [DISCONTINUED] isosorbide mononitrate (IMDUR) 60 MG 24 hr tablet Take 1 tablet (60 mg total) by mouth daily.     Allergies:   Fentanyl, Gabapentin, Lisinopril, Lyrica [pregabalin], Metformin, and Propofol   Social History   Socioeconomic History  . Marital status: Married    Spouse name: Not on file  . Number of children: Not on file  . Years of education: Not on file  . Highest education level: Not on file  Occupational History  . Occupation: retired  Tobacco Use  . Smoking status: Current Every Day Smoker    Packs/day: 0.75    Years: 57.00    Pack years: 42.75    Types: Cigarettes    Last attempt to quit: 04/06/2013    Years since quitting: 7.0  . Smokeless tobacco: Former Neurosurgeon    Quit date: 04/14/2013  . Tobacco comment: pt states that he is using the vapor cigs  Vaping Use  . Vaping Use: Never used  Substance and Sexual Activity  . Alcohol use: Yes    Alcohol/week: 1.0 standard drink     Types: 1 Glasses of wine per week    Comment: "very little"  . Drug use: No  . Sexual activity: Not on file  Other Topics Concern  . Not on file  Social History Narrative  . Not on file   Social Determinants of Health   Financial Resource Strain:   . Difficulty of Paying Living Expenses:   Food Insecurity:   . Worried About Programme researcher, broadcasting/film/video in the Last Year:   . Barista in the Last Year:   Transportation Needs:   . Freight forwarder (Medical):   [name] Lack of Transportation (Non-Medical):   Physical Activity:   . Days of Exercise per Week:   . Minutes of Exercise per Session:   Stress:   . Feeling of Stress :   Social Connections:   . Frequency of Communication with Friends and Family:   . Frequency of Social Gatherings with Friends and Family:   . Attends Religious Services:   . Active Member of Clubs or Organizations:   . Attends Banker Meetings:   [name] Marital Status:      Family History: The patient's family history includes COPD in his mother; Colonic polyp in his mother and sister; Diabetes in his mother; Heart attack in his father and paternal grandfather; Heart disease in his father and sister; Hyperlipidemia in his father; Stroke in his maternal grandfather. There is no history of Other.  ROS:   Please see the history of present illness.     All other systems reviewed and are negative.  EKGs/Labs/Other Studies Reviewed:    The following studies were reviewed today:  Echo 03/30/2020 1. Left ventricular ejection fraction, by estimation, is 55 to 60%. The  left ventricle has normal function. The left ventricle has no regional  wall motion abnormalities. Left ventricular diastolic parameters are  consistent with Grade I diastolic  dysfunction (impaired relaxation).  2. Right ventricular systolic function is normal. The right ventricular  size is normal.  3. The mitral valve is normal in structure. No evidence of mitral valve    regurgitation. No evidence of mitral stenosis.  4. The aortic valve is tricuspid. Aortic valve regurgitation is not  visualized. Mild aortic valve sclerosis is present, with no evidence of  aortic  valve stenosis.  5. The inferior vena cava is normal in size with greater than 50%  respiratory variability, suggesting right atrial pressure of 3 mmHg.   Cath 04/02/2020  Previously placed Ost RCA to Prox RCA stent (unknown type) is widely patent.  Prox RCA to Mid RCA lesion is 70% stenosed.  Prox LAD to Mid LAD lesion is 50% stenosed.  Prox Cx to Mid Cx lesion is 40% stenosed.    EKG:  EKG is ordered today.  The ekg ordered today demonstrates normal sinus rhythm, no significant ST-T wave changes.  Recent Labs: 03/30/2020: ALT 35; B Natriuretic Peptide 199.3; TSH 3.557 04/25/2020: BUN 17; Creatinine, Ser 1.43; Hemoglobin 13.4; Platelets 252; Potassium 4.1; Sodium 134  Recent Lipid Panel    Component Value Date/Time   CHOL 122 03/30/2020 1837   TRIG 85 03/30/2020 1837   HDL 53 03/30/2020 1837   CHOLHDL 2.3 03/30/2020 1837   VLDL 17 03/30/2020 1837   LDLCALC 52 03/30/2020 1837    Physical Exam:    VS:  BP 120/72   Pulse 67   Temp (!) 97.2 F (36.2 C)   Ht 5\' 9"  (1.753 m)   Wt 230 lb 12.8 oz (104.7 kg)   SpO2 97%   BMI 34.08 kg/m     Wt Readings from Last 3 Encounters:  04/25/20 230 lb 12.8 oz (104.7 kg)  04/09/20 (!) 233 lb (105.7 kg)  04/03/20 232 lb 4.8 oz (105.4 kg)     GEN:  Well nourished, well developed in no acute distress HEENT: Normal NECK: No JVD; No carotid bruits LYMPHATICS: No lymphadenopathy CARDIAC: RRR, no murmurs, rubs, gallops RESPIRATORY:  Clear to auscultation without rales, wheezing or rhonchi  ABDOMEN: Soft, non-tender, non-distended MUSCULOSKELETAL:  No edema; No deformity  SKIN: Warm and dry NEUROLOGIC:  Alert and oriented x 3 PSYCHIATRIC:  Normal affect   ASSESSMENT:    1. PAF (paroxysmal atrial fibrillation) ([name])   2. Medication  management   3. Coronary artery disease involving native coronary artery of native heart without angina pectoris   4. Stenosis of left carotid artery   5. PAD (peripheral artery disease) ([name])   6. Essential hypertension   7. Mixed hyperlipidemia   8. Controlled type 2 diabetes mellitus without complication, without long-term current use of insulin ([name])    PLAN:    In order of problems listed above:  1. PAF: Maintaining sinus rhythm based on EKG.  Continue Eliquis and metoprolol.  Obtain CBC and basic metabolic panel.  2. CAD: Recent cardiac catheterization showed nonobstructive disease.  On Plavix   3. carotid artery disease: history of left carotid endarterectomy  4. PAD: No claudication symptom  5. Hypertension: Blood pressure normalized after increasing metoprolol  6. Hyperlipidemia: On Lipitor  7. DM2: Managed by primary care provider.   Medication Adjustments/Labs and Tests Ordered: Current medicines are reviewed at length with the patient today.  Concerns regarding medicines are outlined above.  Orders Placed This Encounter  Procedures  . Basic metabolic panel  . CBC  . EKG 12-Lead   No orders of the defined types were placed in this encounter.   Patient Instructions  Medication Instructions:  Your physician recommends that you continue on your current medications as directed. Please refer to the Current Medication list given to you today.  *If you need a refill on your cardiac medications before your next appointment, please call your pharmacy*  Lab Work: Your physician recommends that you return for lab work TODAY:  BMET  CBC If you have labs (blood work) drawn today and your tests are completely normal, you will receive your results only by: [name] MyChart Message (if you have MyChart) OR . A paper copy in the mail If you have any lab test that is abnormal or we need to change your treatment, we will call you to review the  results.  Testing/Procedures: NONE ordered at this time of appointment   Follow-Up: At [facility], you and your health needs are our priority.  As part of our continuing mission to provide you with exceptional heart care, we have created designated Provider Care Teams.  These Care Teams include your primary Cardiologist (physician) and Advanced Practice Providers (APPs -  Physician Assistants and Nurse Practitioners) who all work together to provide you with the care you need, when you need it.  Your next appointment:   3 month(s)  The format for your next appointment:   In Person  Provider:   Nanetta Batty, MD  Other Instructions      Signed, Azalee Course, PA  04/27/2020 10:53 PM    [facility]

## 2020-04-25 NOTE — Patient Instructions (Signed)
Medication Instructions:  Your physician recommends that you continue on your current medications as directed. Please refer to the Current Medication list given to you today.  *If you need a refill on your cardiac medications before your next appointment, please call your pharmacy*  Lab Work: Your physician recommends that you return for lab work TODAY:   BMET  CBC If you have labs (blood work) drawn today and your tests are completely normal, you will receive your results only by: [name] MyChart Message (if you have MyChart) OR . A paper copy in the mail If you have any lab test that is abnormal or we need to change your treatment, we will call you to review the results.  Testing/Procedures: NONE ordered at this time of appointment   Follow-Up: At [facility], you and your health needs are our priority.  As part of our continuing mission to provide you with exceptional heart care, we have created designated Provider Care Teams.  These Care Teams include your primary Cardiologist (physician) and Advanced Practice Providers (APPs -  Physician Assistants and Nurse Practitioners) who all work together to provide you with the care you need, when you need it.  Your next appointment:   3 month(s)  The format for your next appointment:   In Person  Provider:   Nanetta Batty, MD  Other Instructions

## 2020-04-26 NOTE — Progress Notes (Signed)
Hemoglobin improved, renal function stable, normal electrolyte

## 2020-04-27 ENCOUNTER — Encounter: Payer: Self-pay | Admitting: Physician Assistant

## 2020-05-02 DIAGNOSIS — I4891 Unspecified atrial fibrillation: Secondary | ICD-10-CM | POA: Diagnosis not present

## 2020-05-02 DIAGNOSIS — Z23 Encounter for immunization: Secondary | ICD-10-CM | POA: Diagnosis not present

## 2020-05-02 DIAGNOSIS — E1159 Type 2 diabetes mellitus with other circulatory complications: Secondary | ICD-10-CM | POA: Diagnosis not present

## 2020-05-02 DIAGNOSIS — K219 Gastro-esophageal reflux disease without esophagitis: Secondary | ICD-10-CM | POA: Diagnosis not present

## 2020-05-02 DIAGNOSIS — F3341 Major depressive disorder, recurrent, in partial remission: Secondary | ICD-10-CM | POA: Diagnosis not present

## 2020-05-02 DIAGNOSIS — I2510 Atherosclerotic heart disease of native coronary artery without angina pectoris: Secondary | ICD-10-CM | POA: Diagnosis not present

## 2020-05-02 DIAGNOSIS — Z0000 Encounter for general adult medical examination without abnormal findings: Secondary | ICD-10-CM | POA: Diagnosis not present

## 2020-05-02 DIAGNOSIS — D6859 Other primary thrombophilia: Secondary | ICD-10-CM | POA: Diagnosis not present

## 2020-05-02 DIAGNOSIS — I10 Essential (primary) hypertension: Secondary | ICD-10-CM | POA: Diagnosis not present

## 2020-05-02 DIAGNOSIS — Z1389 Encounter for screening for other disorder: Secondary | ICD-10-CM | POA: Diagnosis not present

## 2020-05-02 DIAGNOSIS — G4700 Insomnia, unspecified: Secondary | ICD-10-CM | POA: Diagnosis not present

## 2020-05-02 DIAGNOSIS — J449 Chronic obstructive pulmonary disease, unspecified: Secondary | ICD-10-CM | POA: Diagnosis not present

## 2020-05-02 DIAGNOSIS — E785 Hyperlipidemia, unspecified: Secondary | ICD-10-CM | POA: Diagnosis not present

## 2020-05-02 DIAGNOSIS — Z7189 Other specified counseling: Secondary | ICD-10-CM | POA: Diagnosis not present

## 2020-05-03 DIAGNOSIS — J441 Chronic obstructive pulmonary disease with (acute) exacerbation: Secondary | ICD-10-CM | POA: Diagnosis not present

## 2020-05-03 DIAGNOSIS — I10 Essential (primary) hypertension: Secondary | ICD-10-CM | POA: Diagnosis not present

## 2020-05-03 DIAGNOSIS — I4891 Unspecified atrial fibrillation: Secondary | ICD-10-CM | POA: Diagnosis not present

## 2020-05-03 DIAGNOSIS — E1159 Type 2 diabetes mellitus with other circulatory complications: Secondary | ICD-10-CM | POA: Diagnosis not present

## 2020-05-03 DIAGNOSIS — G4700 Insomnia, unspecified: Secondary | ICD-10-CM | POA: Diagnosis not present

## 2020-05-03 DIAGNOSIS — F3341 Major depressive disorder, recurrent, in partial remission: Secondary | ICD-10-CM | POA: Diagnosis not present

## 2020-05-03 DIAGNOSIS — E1121 Type 2 diabetes mellitus with diabetic nephropathy: Secondary | ICD-10-CM | POA: Diagnosis not present

## 2020-05-03 DIAGNOSIS — J449 Chronic obstructive pulmonary disease, unspecified: Secondary | ICD-10-CM | POA: Diagnosis not present

## 2020-05-03 DIAGNOSIS — E785 Hyperlipidemia, unspecified: Secondary | ICD-10-CM | POA: Diagnosis not present

## 2020-05-03 DIAGNOSIS — I2510 Atherosclerotic heart disease of native coronary artery without angina pectoris: Secondary | ICD-10-CM | POA: Diagnosis not present

## 2020-06-07 DIAGNOSIS — G4700 Insomnia, unspecified: Secondary | ICD-10-CM | POA: Diagnosis not present

## 2020-06-07 DIAGNOSIS — J449 Chronic obstructive pulmonary disease, unspecified: Secondary | ICD-10-CM | POA: Diagnosis not present

## 2020-06-07 DIAGNOSIS — F3341 Major depressive disorder, recurrent, in partial remission: Secondary | ICD-10-CM | POA: Diagnosis not present

## 2020-06-07 DIAGNOSIS — I2510 Atherosclerotic heart disease of native coronary artery without angina pectoris: Secondary | ICD-10-CM | POA: Diagnosis not present

## 2020-06-07 DIAGNOSIS — I4891 Unspecified atrial fibrillation: Secondary | ICD-10-CM | POA: Diagnosis not present

## 2020-06-07 DIAGNOSIS — E1121 Type 2 diabetes mellitus with diabetic nephropathy: Secondary | ICD-10-CM | POA: Diagnosis not present

## 2020-06-07 DIAGNOSIS — J441 Chronic obstructive pulmonary disease with (acute) exacerbation: Secondary | ICD-10-CM | POA: Diagnosis not present

## 2020-06-07 DIAGNOSIS — E1159 Type 2 diabetes mellitus with other circulatory complications: Secondary | ICD-10-CM | POA: Diagnosis not present

## 2020-06-07 DIAGNOSIS — E785 Hyperlipidemia, unspecified: Secondary | ICD-10-CM | POA: Diagnosis not present

## 2020-06-07 DIAGNOSIS — I10 Essential (primary) hypertension: Secondary | ICD-10-CM | POA: Diagnosis not present

## 2020-07-30 DIAGNOSIS — R42 Dizziness and giddiness: Secondary | ICD-10-CM | POA: Diagnosis not present

## 2020-07-30 DIAGNOSIS — H903 Sensorineural hearing loss, bilateral: Secondary | ICD-10-CM | POA: Diagnosis not present

## 2020-07-31 ENCOUNTER — Other Ambulatory Visit: Payer: Self-pay

## 2020-07-31 ENCOUNTER — Ambulatory Visit (INDEPENDENT_AMBULATORY_CARE_PROVIDER_SITE_OTHER): Payer: Medicare Other | Admitting: Cardiovascular Disease

## 2020-07-31 ENCOUNTER — Encounter: Payer: Self-pay | Admitting: Cardiovascular Disease

## 2020-07-31 DIAGNOSIS — I249 Acute ischemic heart disease, unspecified: Secondary | ICD-10-CM

## 2020-07-31 DIAGNOSIS — I2510 Atherosclerotic heart disease of native coronary artery without angina pectoris: Secondary | ICD-10-CM | POA: Diagnosis not present

## 2020-07-31 DIAGNOSIS — E782 Mixed hyperlipidemia: Secondary | ICD-10-CM | POA: Diagnosis not present

## 2020-07-31 DIAGNOSIS — I739 Peripheral vascular disease, unspecified: Secondary | ICD-10-CM

## 2020-07-31 NOTE — Assessment & Plan Note (Signed)
History of CAD status post stenting of the RCA in 2003 with residual LAD disease.  He did have LAD and circumflex disease as well.  I performed cardiac catheterization on him in the setting of chest pain when he was in A. fib with RVR.  This was done 04/02/2020 revealing a patent RCA stent.  He did have an intermediate lesion just beyond the stented segment which I performed FFR on revealing to be not physiologically significant.  He had otherwise noncritical disease in the LAD and circumflex.  He said no recurrent chest pain.  He is on clopidogrel.

## 2020-07-31 NOTE — Assessment & Plan Note (Signed)
History of peripheral arterial disease status post left common iliac artery PTA and stenting by myself 10/20/2013 with a 10 mm x 60 mm long stent.  He does complain of right lower extremity claudication with Dopplers that were performed 11/18/2018 revealing what appeared to be in-stent restenosis within the right common iliac artery stent.  We will repeat lower extremity arterial Doppler studies.

## 2020-07-31 NOTE — Patient Instructions (Addendum)
Medication Instructions:  Your physician recommends that you continue on your current medications as directed. Please refer to the Current Medication list given to you today.  *If you need a refill on your cardiac medications before your next appointment, please call your pharmacy*  Testing/Procedures: Your physician has requested that you have an Aorta/Iliac Duplex. This will be take place at [address] [facility] [facility], [address].    No food after 11PM the night before.  Water is OK. (Don't drink liquids if you have been instructed not to for ANOTHER test).  Take two Extra-Strength Gas-X capsules at bedtime the night before test.   Take an additional two Extra-Strength Gas-X capsules three (3) hours before the test or first thing in the morning.    Avoid foods that produce bowel gas, for 24 hours prior to exam (see below).    No breakfast, no chewing gum, no smoking or carbonated beverages.  Patient may take morning medications with water.  Come in for test at least 15 minutes early to register.   Your physician has requested that you have an ankle brachial index (ABI). During this test an ultrasound and blood pressure cuff are used to evaluate the arteries that supply the arms and legs with blood. Allow thirty minutes for this exam. There are no restrictions or special instructions.  Your physician has requested that you have a lower extremity arterial duplex. This test is an ultrasound of the arteries in the legs. It looks at arterial blood flow in the legs. Allow one hour for Lower and Upper Arterial scans. There are no restrictions or special instructions    Follow-Up: At [facility], you and your health needs are our priority.  As part of our continuing mission to provide you with exceptional heart care, we have created designated Provider Care Teams.  These Care Teams include your primary Cardiologist (physician) and Advanced Practice Providers (APPs -  Physician Assistants and  Nurse Practitioners) who all work together to provide you with the care you need, when you need it.  We recommend signing up for the patient portal called "MyChart".  Sign up information is provided on this After Visit Summary.  MyChart is used to connect with patients for Virtual Visits (Telemedicine).  Patients are able to view lab/test results, encounter notes, upcoming appointments, etc.  Non-urgent messages can be sent to your provider as well.   To learn more about what you can do with MyChart, go to [email or web address].    Your next appointment:   12 month(s)  The format for your next appointment:   In Person  Provider:   Nanetta Batty, MD

## 2020-07-31 NOTE — Progress Notes (Signed)
07/31/2020 Albert Powell   June 20, 1941  [id number]  Primary Physician [name], MD Primary Cardiologist: [name] MD [name], [place], [place]  HPI:  Albert Powell is a 79 y.o.   moderately overweight married Caucasian malewith known history of CAD. I last saw in the office 03/31/2019.Albert Powell He is status post stenting with 2 stents to RCA in 2003 and residual LAD, hypertension and hyperlipidemia and diabetes mellitus on diet, presented to the ER because of jaw discomfort with chest pressure for 2-3 days. Patient's symptoms usually happen in the night. .  Patient was negative for myocardial infarction he had a LexiScan Myoview 02/07/13 which was negative for ischemia, but due to similarities of his prior coronary disease and the symptoms and multiple risk factors for coronary disease including family history hypertension diabetes known coronary artery disease patient was scheduled for cardiac catheterization with Dr. Allyson Sabal. Revealing 80-90% focal distal LAD disease and a 1.5-1.7 mm vessel left circumflex nondominant with a 90% AV groove stenosis in a small vessel there was a moderate to large obtuse marginal branch with a 60% proximal segmental calcified stenosis. The RCA had widely patent stents. EF was 60%. Due to the small size of the vessels and the negative nuclear stress test that are very filled medical therapy was the patient's best option.  Patient was seen back in followup last week with a heart rate of 48 symptomatic with weakness feeling very tired. He still had some shortness of breath but no jaw pain which is his anginal equivalent. Medications were adjusted because Lopressor actually had him hold it one day and then he was started back half a tablet daily he felt better so he is now resumed half tablet twice a day.  Today's back for followup initially his heart rate was 60 after he walked in the room but after sitting for 10-15 minutes EKG revealed sinus  bradycardia rate of 49 but no acute changes otherwise. His blood pressure was improved from his last visit.  Additionally he denies any shortness of breath he denies any jaw pain.  Since he was seen by [name] 02/24/13 and his beta blockers were adjusted he no longer is bradycardic or symptomatic. He denies chest pain or shortness of breath. I do hear a carotid bruit on exam and cardiac echo Doppler studies on him.  I performed carotid Dopplers on him 04/11/13 which revealed a moderately high-grade left ICA stenosis. He is neurologically asymptomatic. Based on the intermediate nature of his Doppler study is unclear to me whether he has a lesion that can be followed conservatively by ultrasound or requires surgical revascularization. Because of this I decided to proceed with carotid angiography to further define the degree of stenosis.this was performed on 04/25/13 revealing a 95% proximal left internal carotid artery stenosis which suddenly underwent endarterectomy by Dr. [name] on 06/02/13.I also performed angiography on his aortoiliac Arteries revealing 90% calcified right extrailiac artery stenosis.I performed diamondback or rotational atherectomy, PT and stenting of his right external iliac artery on 10/20/13. He had an excellent angiographic and clinical result. His post procedure Dopplers have improved. He did have a small pseudoaneurysm in his right groin which was successfully compressed at the hospital and has remained closed. Since I saw me year ago his major complaints have been lifestyle limiting back and hip pain. He's had imaging studies that suggest spinal stenosis and wishes to undergo decompressive laminectomy by Dr. Shon Baton in the upcoming future.. He had lower extremity  Dopplers performed in our office 04/08/17 that showed a right ABI of 0.85 and a left aBI of 0.96.  I repeated his lower extremity arterial Doppler studies which showed some progression of disease however he ultimately  underwent angiography by Dr. [name] 03/30/2018, 2 months after his Dopplers, and had covered stenting of both iliac arteries.  He continues to have similar symptoms as he did prior to his intervention.  He denies chest pain or shortness of breath.  I performed coronary angiography on him the setting of chest pain with A. fib with RVR 04/02/2020 revealing a patent RCA stent with an intermediate lesion just beyond the stented segment that was negative by DFR.  Medical therapy is recommended.  He did convert spontaneously to sinus rhythm and is on Eliquis oral anticoagulation in addition to Plavix.    Current Meds  Medication Sig  . amLODipine (NORVASC) 10 MG tablet Take 10 mg by mouth at bedtime.   Albert Powell apixaban (ELIQUIS) 5 MG TABS tablet Take 1 tablet (5 mg total) by mouth 2 (two) times daily.  Albert Powell atorvastatin (LIPITOR) 40 MG tablet Take 40 mg by mouth at bedtime.   . clopidogrel (PLAVIX) 75 MG tablet Take 1 tablet (75 mg total) by mouth daily.  Albert Powell escitalopram (LEXAPRO) 20 MG tablet Take 20 mg by mouth daily.  . famotidine (PEPCID) 20 MG tablet Take 20 mg by mouth every other day.   . fluticasone (FLONASE) 50 MCG/ACT nasal spray fluticasone propionate 50 mcg/actuation nasal spray,suspension  SPRAY 1 SPRAY INTO EACH NOSTRIL EVERY DAY  . Fluticasone-Salmeterol (ADVAIR) 100-50 MCG/DOSE AEPB Inhale 1 puff into the lungs daily.   Albert Powell glipiZIDE (GLUCOTROL) 5 MG tablet Take 5 mg by mouth daily at 12 noon.   . hydroxypropyl methylcellulose / hypromellose (ISOPTO TEARS / GONIOVISC) 2.5 % ophthalmic solution Place 1 drop into both eyes daily as needed for dry eyes.  . influenza vaccine adjuvanted (FLUAD QUADRIVALENT) 0.5 ML injection Fluad Quad 2020-2021(26yr up)(PF) 60 mcg (15 mcg x 4)/0.73mL IM syringe  PHARMACY ADMINISTERED  . losartan-hydrochlorothiazide (HYZAAR) 100-25 MG per tablet Take 1 tablet by mouth at bedtime.   . metoprolol tartrate (LOPRESSOR) 25 MG tablet Take 1 tablet (25 mg total) by mouth 2  (two) times daily.  Albert Powell OZEMPIC, 1 MG/DOSE, 2 MG/1.5ML SOPN Inject 2 mg into the skin once a week.   . pantoprazole (PROTONIX) 40 MG tablet Take 40 mg by mouth every other day.      Allergies  Allergen Reactions  . Fentanyl Other (See Comments)    Behavioral changes  . Gabapentin Other (See Comments)    ankle swells  . Lisinopril Other (See Comments)    unknown  . Lyrica [Pregabalin] Other (See Comments)    unknown  . Metformin Diarrhea  . Propofol Other (See Comments)    Heart rate dropped    Social History   Socioeconomic History  . Marital status: Married    Spouse name: Not on file  . Number of children: Not on file  . Years of education: Not on file  . Highest education level: Not on file  Occupational History  . Occupation: retired  Tobacco Use  . Smoking status: Current Every Day Smoker    Packs/day: 0.75    Years: 57.00    Pack years: 42.75    Types: Cigarettes    Last attempt to quit: 04/06/2013    Years since quitting: 7.3  . Smokeless tobacco: Former Neurosurgeon    Quit date: 04/14/2013  .  Tobacco comment: pt states that he is using the vapor cigs  Vaping Use  . Vaping Use: Never used  Substance and Sexual Activity  . Alcohol use: Yes    Alcohol/week: 1.0 standard drink    Types: 1 Glasses of wine per week    Comment: "very little"  . Drug use: No  . Sexual activity: Not on file  Other Topics Concern  . Not on file  Social History Narrative  . Not on file   Social Determinants of Health   Financial Resource Strain:   . Difficulty of Paying Living Expenses: Not on file  Food Insecurity:   . Worried About Programme researcher, broadcasting/film/video in the Last Year: Not on file  . Ran Out of Food in the Last Year: Not on file  Transportation Needs:   . Lack of Transportation (Medical): Not on file  . Lack of Transportation (Non-Medical): Not on file  Physical Activity:   . Days of Exercise per Week: Not on file  . Minutes of Exercise per Session: Not on file  Stress:   .  Feeling of Stress : Not on file  Social Connections:   . Frequency of Communication with Friends and Family: Not on file  . Frequency of Social Gatherings with Friends and Family: Not on file  . Attends Religious Services: Not on file  . Active Member of Clubs or Organizations: Not on file  . Attends Banker Meetings: Not on file  . Marital Status: Not on file  Intimate Partner Violence:   . Fear of Current or Ex-Partner: Not on file  . Emotionally Abused: Not on file  . Physically Abused: Not on file  . Sexually Abused: Not on file     Review of Systems: General: negative for chills, fever, night sweats or weight changes.  Cardiovascular: negative for chest pain, dyspnea on exertion, edema, orthopnea, palpitations, paroxysmal nocturnal dyspnea or shortness of breath Dermatological: negative for rash Respiratory: negative for cough or wheezing Urologic: negative for hematuria Abdominal: negative for nausea, vomiting, diarrhea, bright red blood per rectum, melena, or hematemesis Neurologic: negative for visual changes, syncope, or dizziness All other systems reviewed and are otherwise negative except as noted above.    Blood pressure 136/68, pulse 73, height 5\' 9"  (1.753 m), weight 231 lb 9.6 oz (105.1 kg), SpO2 99 %.  General appearance: alert and no distress Neck: no adenopathy, no carotid bruit, no JVD, supple, symmetrical, trachea midline and thyroid not enlarged, symmetric, no tenderness/mass/nodules Lungs: clear to auscultation bilaterally Heart: regular rate and rhythm, S1, S2 normal, no murmur, click, rub or gallop Extremities: extremities normal, atraumatic, no cyanosis or edema Pulses: 2+ and symmetric Skin: Skin color, texture, turgor normal. No rashes or lesions Neurologic: Alert and oriented X 3, normal strength and tone. Normal symmetric reflexes. Normal coordination and gait  EKG not performed today  ASSESSMENT AND PLAN:   CAD hx of 2 stents to RCA  in 2003,  80-90% stenosis of LAD/CFX June 2014- medical Rx History of CAD status post stenting of the RCA in 2003 with residual LAD disease.  He did have LAD and circumflex disease as well.  I performed cardiac catheterization on him in the setting of chest pain when he was in A. fib with RVR.  This was done 04/02/2020 revealing a patent RCA stent.  He did have an intermediate lesion just beyond the stented segment which I performed FFR on revealing to be not physiologically significant.  He had  otherwise noncritical disease in the LAD and circumflex.  He said no recurrent chest pain.  He is on clopidogrel.  Hyperlipidemia Patient hyperlipidemia on statin therapy with lipid profile performed 05/02/2020 revealing a total cholesterol of 94, LDL 29 and HDL of 36.  Peripheral arterial disease History of peripheral arterial disease status post left common iliac artery PTA and stenting by myself 10/20/2013 with a 10 mm x 60 mm long stent.  He does complain of right lower extremity claudication with Dopplers that were performed 11/18/2018 revealing what appeared to be in-stent restenosis within the right common iliac artery stent.  We will repeat lower extremity arterial Doppler studies.      [name] MD FACP,FACC,FAHA, [facility] 07/31/2020 2:19 PM

## 2020-07-31 NOTE — Assessment & Plan Note (Signed)
Patient hyperlipidemia on statin therapy with lipid profile performed 05/02/2020 revealing a total cholesterol of 94, LDL 29 and HDL of 36.

## 2020-08-03 DIAGNOSIS — R42 Dizziness and giddiness: Secondary | ICD-10-CM | POA: Diagnosis not present

## 2020-08-15 ENCOUNTER — Other Ambulatory Visit (HOSPITAL_COMMUNITY): Payer: Self-pay | Admitting: Cardiovascular Disease

## 2020-08-15 ENCOUNTER — Ambulatory Visit (HOSPITAL_COMMUNITY)
Admission: RE | Admit: 2020-08-15 | Discharge: 2020-08-15 | Disposition: A | Discharge status: Home / Self Care | Payer: Medicare Other | Source: Ambulatory Visit | Attending: Cardiology | Admitting: Cardiology

## 2020-08-15 ENCOUNTER — Ambulatory Visit (HOSPITAL_BASED_OUTPATIENT_CLINIC_OR_DEPARTMENT_OTHER)
Admission: RE | Admit: 2020-08-15 | Discharge: 2020-08-15 | Disposition: A | Discharge status: Home / Self Care | Payer: Medicare Other | Source: Ambulatory Visit | Attending: Cardiology | Admitting: Cardiology

## 2020-08-15 ENCOUNTER — Other Ambulatory Visit: Payer: Self-pay

## 2020-08-15 DIAGNOSIS — E782 Mixed hyperlipidemia: Secondary | ICD-10-CM

## 2020-08-15 DIAGNOSIS — I739 Peripheral vascular disease, unspecified: Secondary | ICD-10-CM | POA: Diagnosis not present

## 2020-08-17 ENCOUNTER — Other Ambulatory Visit: Payer: Self-pay

## 2020-08-17 ENCOUNTER — Encounter: Payer: Self-pay | Admitting: Cardiovascular Disease

## 2020-08-17 ENCOUNTER — Ambulatory Visit (INDEPENDENT_AMBULATORY_CARE_PROVIDER_SITE_OTHER): Payer: Medicare Other | Admitting: Cardiovascular Disease

## 2020-08-17 VITALS — BP 130/82 | HR 99 | Ht 69.0 in | Wt 231.6 lb

## 2020-08-17 DIAGNOSIS — I739 Peripheral vascular disease, unspecified: Secondary | ICD-10-CM | POA: Diagnosis not present

## 2020-08-17 DIAGNOSIS — Z01810 Encounter for preprocedural cardiovascular examination: Secondary | ICD-10-CM | POA: Diagnosis not present

## 2020-08-17 DIAGNOSIS — I249 Acute ischemic heart disease, unspecified: Secondary | ICD-10-CM

## 2020-08-17 DIAGNOSIS — I2510 Atherosclerotic heart disease of native coronary artery without angina pectoris: Secondary | ICD-10-CM

## 2020-08-17 DIAGNOSIS — I480 Paroxysmal atrial fibrillation: Secondary | ICD-10-CM | POA: Diagnosis not present

## 2020-08-17 MED ORDER — SODIUM CHLORIDE 0.9% FLUSH
3.0000 mL | Freq: Two times a day (BID) | INTRAVENOUS | Status: DC
Start: 2020-08-17 — End: 2021-12-31

## 2020-08-17 NOTE — Progress Notes (Signed)
08/17/2020 Albert Powell   Aug 07, 1941  [id number]  Primary Physician [name], MD Primary Cardiologist: [name] MD [name], [place], [place]  HPI:  Albert Powell is a 79 y.o.   moderately overweight married Caucasian malewith known history of CAD. I lastsaw in the office  07/31/2020.[name] He is status post stenting with 2 stents to RCA in 2003 and residual LAD, hypertension and hyperlipidemia and diabetes mellitus on diet, presented to the ER because of jaw discomfort with chest pressure for 2-3 days. Patient's symptoms usually happen in the night. .  Patient was negative for myocardial infarction he had a LexiScan Myoview 02/07/13 which was negative for ischemia, but due to similarities of his prior coronary disease and the symptoms and multiple risk factors for coronary disease including family history hypertension diabetes known coronary artery disease patient was scheduled for cardiac catheterization with Dr. Allyson Sabal. Revealing 80-90% focal distal LAD disease and a 1.5-1.7 mm vessel left circumflex nondominant with a 90% AV groove stenosis in a small vessel there was a moderate to large obtuse marginal branch with a 60% proximal segmental calcified stenosis. The RCA had widely patent stents. EF was 60%. Due to the small size of the vessels and the negative nuclear stress test that are very filled medical therapy was the patient's best option.  Patient was seen back in followup last week with a heart rate of 48 symptomatic with weakness feeling very tired. He still had some shortness of breath but no jaw pain which is his anginal equivalent. Medications were adjusted because Lopressor actually had him hold it one day and then he was started back half a tablet daily he felt better so he is now resumed half tablet twice a day.  Today's back for followup initially his heart rate was 60 after he walked in the room but after sitting for 10-15 minutes EKG revealed sinus  bradycardia rate of 49 but no acute changes otherwise. His blood pressure was improved from his last visit.  Additionally he denies any shortness of breath he denies any jaw pain.  Since he was seen by [name] 02/24/13 and his beta blockers were adjusted he no longer is bradycardic or symptomatic. He denies chest pain or shortness of breath. I do hear a carotid bruit on exam and cardiac echo Doppler studies on him.  I performed carotid Dopplers on him 04/11/13 which revealed a moderately high-grade left ICA stenosis. He is neurologically asymptomatic. Based on the intermediate nature of his Doppler study is unclear to me whether he has a lesion that can be followed conservatively by ultrasound or requires surgical revascularization. Because of this I decided to proceed with carotid angiography to further define the degree of stenosis.this was performed on 04/25/13 revealing a 95% proximal left internal carotid artery stenosis which suddenly underwent endarterectomy by Dr. [name] on 06/02/13.I also performed angiography on his aortoiliac Arteries revealing 90% calcified right extrailiac artery stenosis.I performed diamondback or rotational atherectomy, PT and stenting of his right external iliac artery on 10/20/13. He had an excellent angiographic and clinical result. His post procedure Dopplers have improved. He did have a small pseudoaneurysm in his right groin which was successfully compressed at the hospital and has remained closed. Since I saw me year ago his major complaints have been lifestyle limiting back and hip pain. He's had imaging studies that suggest spinal stenosis and wishes to undergo decompressive laminectomy by Dr. Shon Baton in the upcoming future.. He had lower extremity  Dopplers performed in our office 04/08/17 that showed a right ABI of 0.85 and a left aBI of 0.96.  I repeated his lower extremity arterial Doppler studies which showed some progression of disease however he ultimately  underwent angiography by Dr. Brabham 03/30/2018, 2 months after his Dopplers, and had covered stenting of both iliac arteries. He continues to have similar symptoms as he did prior to his intervention. He denies chest pain or shortness of breath.  I performed coronary angiography on him the setting of chest pain with A. fib with RVR 04/02/2020 revealing a patent RCA stent with an intermediate lesion just beyond the stented segment that was negative by DFR.  Medical therapy is recommended.  He did convert spontaneously to sinus rhythm and is on Eliquis oral anticoagulation in addition to Plavix.  He had lower extremity arterial Doppler studies performed 08/15/2020 revealing a right ABI of 0.26 and a left of 0.80.  He had monophasic right iliac waveforms and significant disease in his right common femoral artery and SFA.  He does have lifestyle limiting right calf claudication wishes to proceed with angiography.    Current Meds  Medication Sig  . amLODipine (NORVASC) 10 MG tablet Take 10 mg by mouth at bedtime.   . apixaban (ELIQUIS) 5 MG TABS tablet Take 1 tablet (5 mg total) by mouth 2 (two) times daily.  . atorvastatin (LIPITOR) 40 MG tablet Take 40 mg by mouth at bedtime.   . clopidogrel (PLAVIX) 75 MG tablet Take 1 tablet (75 mg total) by mouth daily.  . escitalopram (LEXAPRO) 20 MG tablet Take 20 mg by mouth daily.  . famotidine (PEPCID) 20 MG tablet Take 20 mg by mouth every other day.   . fluticasone (FLONASE) 50 MCG/ACT nasal spray fluticasone propionate 50 mcg/actuation nasal spray,suspension  SPRAY 1 SPRAY INTO EACH NOSTRIL EVERY DAY  . Fluticasone-Salmeterol (ADVAIR) 100-50 MCG/DOSE AEPB Inhale 1 puff into the lungs daily.   . glipiZIDE (GLUCOTROL) 5 MG tablet Take 5 mg by mouth daily at 12 noon.   . hydroxypropyl methylcellulose / hypromellose (ISOPTO TEARS / GONIOVISC) 2.5 % ophthalmic solution Place 1 drop into both eyes daily as needed for dry eyes.  . influenza vaccine adjuvanted  (FLUAD QUADRIVALENT) 0.5 ML injection Fluad Quad 2020-2021(65yr up)(PF) 60 mcg (15 mcg x 4)/0.5mL IM syringe  PHARMACY ADMINISTERED  . losartan-hydrochlorothiazide (HYZAAR) 100-25 MG per tablet Take 1 tablet by mouth at bedtime.   . metoprolol tartrate (LOPRESSOR) 25 MG tablet Take 1 tablet (25 mg total) by mouth 2 (two) times daily.  . OZEMPIC, 1 MG/DOSE, 2 MG/1.5ML SOPN Inject 2 mg into the skin once a week.   . pantoprazole (PROTONIX) 40 MG tablet Take 40 mg by mouth every other day.      Allergies  Allergen Reactions  . Fentanyl Other (See Comments)    Behavioral changes  . Gabapentin Other (See Comments)    ankle swells  . Lisinopril Other (See Comments)    unknown  . Lyrica [Pregabalin] Other (See Comments)    unknown  . Metformin Diarrhea  . Propofol Other (See Comments)    Heart rate dropped    Social History   Socioeconomic History  . Marital status: Married    Spouse name: Not on file  . Number of children: Not on file  . Years of education: Not on file  . Highest education level: Not on file  Occupational History  . Occupation: retired  Tobacco Use  . Smoking status: Current Every   Day Smoker    Packs/day: 0.75    Years: 57.00    Pack years: 42.75    Types: Cigarettes    Last attempt to quit: 04/06/2013    Years since quitting: 7.3  . Smokeless tobacco: Former Neurosurgeon    Quit date: 04/14/2013  . Tobacco comment: pt states that he is using the vapor cigs  Vaping Use  . Vaping Use: Never used  Substance and Sexual Activity  . Alcohol use: Yes    Alcohol/week: 1.0 standard drink    Types: 1 Glasses of wine per week    Comment: "very little"  . Drug use: No  . Sexual activity: Not on file  Other Topics Concern  . Not on file  Social History Narrative  . Not on file   Social Determinants of Health   Financial Resource Strain:   . Difficulty of Paying Living Expenses: Not on file  Food Insecurity:   . Worried About Programme researcher, broadcasting/film/video in the Last Year:  Not on file  . Ran Out of Food in the Last Year: Not on file  Transportation Needs:   . Lack of Transportation (Medical): Not on file  . Lack of Transportation (Non-Medical): Not on file  Physical Activity:   . Days of Exercise per Week: Not on file  . Minutes of Exercise per Session: Not on file  Stress:   . Feeling of Stress : Not on file  Social Connections:   . Frequency of Communication with Friends and Family: Not on file  . Frequency of Social Gatherings with Friends and Family: Not on file  . Attends Religious Services: Not on file  . Active Member of Clubs or Organizations: Not on file  . Attends Banker Meetings: Not on file  . Marital Status: Not on file  Intimate Partner Violence:   . Fear of Current or Ex-Partner: Not on file  . Emotionally Abused: Not on file  . Physically Abused: Not on file  . Sexually Abused: Not on file     Review of Systems: General: negative for chills, fever, night sweats or weight changes.  Cardiovascular: negative for chest pain, dyspnea on exertion, edema, orthopnea, palpitations, paroxysmal nocturnal dyspnea or shortness of breath Dermatological: negative for rash Respiratory: negative for cough or wheezing Urologic: negative for hematuria Abdominal: negative for nausea, vomiting, diarrhea, bright red blood per rectum, melena, or hematemesis Neurologic: negative for visual changes, syncope, or dizziness All other systems reviewed and are otherwise negative except as noted above.    Blood pressure 130/82, pulse 99, height 5\' 9"  (1.753 m), weight 231 lb 9.6 oz (105.1 kg), SpO2 98 %.  General appearance: alert and no distress Neck: no adenopathy, no carotid bruit, no JVD, supple, symmetrical, trachea midline and thyroid not enlarged, symmetric, no tenderness/mass/nodules Lungs: clear to auscultation bilaterally Heart: regular rate and rhythm, S1, S2 normal, no murmur, click, rub or gallop Extremities: extremities normal,  atraumatic, no cyanosis or edema Pulses: Decreased right pedal pulse Skin: Skin color, texture, turgor normal. No rashes or lesions Neurologic: Alert and oriented X 3, normal strength and tone. Normal symmetric reflexes. Normal coordination and gait  EKG sinus rhythm at 99 for R wave progression and inferior Q waves.  I personally reviewed this EKG.  ASSESSMENT AND PLAN:   Peripheral arterial disease Mr. Hallisey returns for follow-up of his lower extremity Doppler studies performed 08/15/2020.  He does have a history of diamondback orbital rotational atherectomy, PTA and stenting of his  right external iliac artery by myself 10/20/2013 with an excellent result.  Because of back and leg pain he underwent angiography by Dr. [name] 03/30/2018 and had to DBX stents placed at his iliac bifurcation.  He does complain of some numbness in the right side of his leg when he lies in bed but also right calf claudication.  Recent Doppler studies showed a right ABI of 0.26 and a left of 0.80.  He had monophasic iliac waveforms and significant disease in his right common femoral artery and SFA.  He wishes to proceed with angiography potential endovascular therapy.  He will need to stop his Eliquis 2 days before the procedure.      [name] MD FACP,FACC,FAHA, [facility] 08/17/2020 12:13 PM

## 2020-08-17 NOTE — H&P (View-Only) (Signed)
08/17/2020 [name]   Aug 07, 1941  [id number]  Primary Physician [name], MD Primary Cardiologist: [name] MD [name], [place], [place]  HPI:  Albert Powell is a 79 y.o.   moderately overweight married Caucasian malewith known history of CAD. I lastsaw in the office  07/31/2020.[name] He is status post stenting with 2 stents to RCA in 2003 and residual LAD, hypertension and hyperlipidemia and diabetes mellitus on diet, presented to the ER because of jaw discomfort with chest pressure for 2-3 days. Patient's symptoms usually happen in the night. .  Patient was negative for myocardial infarction he had a LexiScan Myoview 02/07/13 which was negative for ischemia, but due to similarities of his prior coronary disease and the symptoms and multiple risk factors for coronary disease including family history hypertension diabetes known coronary artery disease patient was scheduled for cardiac catheterization with Dr. Allyson Sabal. Revealing 80-90% focal distal LAD disease and a 1.5-1.7 mm vessel left circumflex nondominant with a 90% AV groove stenosis in a small vessel there was a moderate to large obtuse marginal branch with a 60% proximal segmental calcified stenosis. The RCA had widely patent stents. EF was 60%. Due to the small size of the vessels and the negative nuclear stress test that are very filled medical therapy was the patient's best option.  Patient was seen back in followup last week with a heart rate of 48 symptomatic with weakness feeling very tired. He still had some shortness of breath but no jaw pain which is his anginal equivalent. Medications were adjusted because Lopressor actually had him hold it one day and then he was started back half a tablet daily he felt better so he is now resumed half tablet twice a day.  Today's back for followup initially his heart rate was 60 after he walked in the room but after sitting for 10-15 minutes EKG revealed sinus  bradycardia rate of 49 but no acute changes otherwise. His blood pressure was improved from his last visit.  Additionally he denies any shortness of breath he denies any jaw pain.  Since he was seen by [name] 02/24/13 and his beta blockers were adjusted he no longer is bradycardic or symptomatic. He denies chest pain or shortness of breath. I do hear a carotid bruit on exam and cardiac echo Doppler studies on him.  I performed carotid Dopplers on him 04/11/13 which revealed a moderately high-grade left ICA stenosis. He is neurologically asymptomatic. Based on the intermediate nature of his Doppler study is unclear to me whether he has a lesion that can be followed conservatively by ultrasound or requires surgical revascularization. Because of this I decided to proceed with carotid angiography to further define the degree of stenosis.this was performed on 04/25/13 revealing a 95% proximal left internal carotid artery stenosis which suddenly underwent endarterectomy by Dr. [name] on 06/02/13.I also performed angiography on his aortoiliac Arteries revealing 90% calcified right extrailiac artery stenosis.I performed diamondback or rotational atherectomy, PT and stenting of his right external iliac artery on 10/20/13. He had an excellent angiographic and clinical result. His post procedure Dopplers have improved. He did have a small pseudoaneurysm in his right groin which was successfully compressed at the hospital and has remained closed. Since I saw me year ago his major complaints have been lifestyle limiting back and hip pain. He's had imaging studies that suggest spinal stenosis and wishes to undergo decompressive laminectomy by Dr. Shon Baton in the upcoming future.. He had lower extremity  Dopplers performed in our office 04/08/17 that showed a right ABI of 0.85 and a left aBI of 0.96.  I repeated his lower extremity arterial Doppler studies which showed some progression of disease however he ultimately  underwent angiography by Dr. [name] 03/30/2018, 2 months after his Dopplers, and had covered stenting of both iliac arteries. He continues to have similar symptoms as he did prior to his intervention. He denies chest pain or shortness of breath.  I performed coronary angiography on him the setting of chest pain with A. fib with RVR 04/02/2020 revealing a patent RCA stent with an intermediate lesion just beyond the stented segment that was negative by DFR.  Medical therapy is recommended.  He did convert spontaneously to sinus rhythm and is on Eliquis oral anticoagulation in addition to Plavix.  He had lower extremity arterial Doppler studies performed 08/15/2020 revealing a right ABI of 0.26 and a left of 0.80.  He had monophasic right iliac waveforms and significant disease in his right common femoral artery and SFA.  He does have lifestyle limiting right calf claudication wishes to proceed with angiography.    Current Meds  Medication Sig  . amLODipine (NORVASC) 10 MG tablet Take 10 mg by mouth at bedtime.   [name] apixaban (ELIQUIS) 5 MG TABS tablet Take 1 tablet (5 mg total) by mouth 2 (two) times daily.  [name] atorvastatin (LIPITOR) 40 MG tablet Take 40 mg by mouth at bedtime.   . clopidogrel (PLAVIX) 75 MG tablet Take 1 tablet (75 mg total) by mouth daily.  [name] escitalopram (LEXAPRO) 20 MG tablet Take 20 mg by mouth daily.  . famotidine (PEPCID) 20 MG tablet Take 20 mg by mouth every other day.   . fluticasone (FLONASE) 50 MCG/ACT nasal spray fluticasone propionate 50 mcg/actuation nasal spray,suspension  SPRAY 1 SPRAY INTO EACH NOSTRIL EVERY DAY  . Fluticasone-Salmeterol (ADVAIR) 100-50 MCG/DOSE AEPB Inhale 1 puff into the lungs daily.   [name] glipiZIDE (GLUCOTROL) 5 MG tablet Take 5 mg by mouth daily at 12 noon.   . hydroxypropyl methylcellulose / hypromellose (ISOPTO TEARS / GONIOVISC) 2.5 % ophthalmic solution Place 1 drop into both eyes daily as needed for dry eyes.  . influenza vaccine adjuvanted  (FLUAD QUADRIVALENT) 0.5 ML injection Fluad Quad 2020-2021(26yr up)(PF) 60 mcg (15 mcg x 4)/0.28mL IM syringe  PHARMACY ADMINISTERED  . losartan-hydrochlorothiazide (HYZAAR) 100-25 MG per tablet Take 1 tablet by mouth at bedtime.   . metoprolol tartrate (LOPRESSOR) 25 MG tablet Take 1 tablet (25 mg total) by mouth 2 (two) times daily.  [name] OZEMPIC, 1 MG/DOSE, 2 MG/1.5ML SOPN Inject 2 mg into the skin once a week.   . pantoprazole (PROTONIX) 40 MG tablet Take 40 mg by mouth every other day.      Allergies  Allergen Reactions  . Fentanyl Other (See Comments)    Behavioral changes  . Gabapentin Other (See Comments)    ankle swells  . Lisinopril Other (See Comments)    unknown  . Lyrica [Pregabalin] Other (See Comments)    unknown  . Metformin Diarrhea  . Propofol Other (See Comments)    Heart rate dropped    Social History   Socioeconomic History  . Marital status: Married    Spouse name: Not on file  . Number of children: Not on file  . Years of education: Not on file  . Highest education level: Not on file  Occupational History  . Occupation: retired  Tobacco Use  . Smoking status: Current Every  Day Smoker    Packs/day: 0.75    Years: 57.00    Pack years: 42.75    Types: Cigarettes    Last attempt to quit: 04/06/2013    Years since quitting: 7.3  . Smokeless tobacco: Former Neurosurgeon    Quit date: 04/14/2013  . Tobacco comment: pt states that he is using the vapor cigs  Vaping Use  . Vaping Use: Never used  Substance and Sexual Activity  . Alcohol use: Yes    Alcohol/week: 1.0 standard drink    Types: 1 Glasses of wine per week    Comment: "very little"  . Drug use: No  . Sexual activity: Not on file  Other Topics Concern  . Not on file  Social History Narrative  . Not on file   Social Determinants of Health   Financial Resource Strain:   . Difficulty of Paying Living Expenses: Not on file  Food Insecurity:   . Worried About Programme researcher, broadcasting/film/video in the Last Year:  Not on file  . Ran Out of Food in the Last Year: Not on file  Transportation Needs:   . Lack of Transportation (Medical): Not on file  . Lack of Transportation (Non-Medical): Not on file  Physical Activity:   . Days of Exercise per Week: Not on file  . Minutes of Exercise per Session: Not on file  Stress:   . Feeling of Stress : Not on file  Social Connections:   . Frequency of Communication with Friends and Family: Not on file  . Frequency of Social Gatherings with Friends and Family: Not on file  . Attends Religious Services: Not on file  . Active Member of Clubs or Organizations: Not on file  . Attends Banker Meetings: Not on file  . Marital Status: Not on file  Intimate Partner Violence:   . Fear of Current or Ex-Partner: Not on file  . Emotionally Abused: Not on file  . Physically Abused: Not on file  . Sexually Abused: Not on file     Review of Systems: General: negative for chills, fever, night sweats or weight changes.  Cardiovascular: negative for chest pain, dyspnea on exertion, edema, orthopnea, palpitations, paroxysmal nocturnal dyspnea or shortness of breath Dermatological: negative for rash Respiratory: negative for cough or wheezing Urologic: negative for hematuria Abdominal: negative for nausea, vomiting, diarrhea, bright red blood per rectum, melena, or hematemesis Neurologic: negative for visual changes, syncope, or dizziness All other systems reviewed and are otherwise negative except as noted above.    Blood pressure 130/82, pulse 99, height 5\' 9"  (1.753 m), weight 231 lb 9.6 oz (105.1 kg), SpO2 98 %.  General appearance: alert and no distress Neck: no adenopathy, no carotid bruit, no JVD, supple, symmetrical, trachea midline and thyroid not enlarged, symmetric, no tenderness/mass/nodules Lungs: clear to auscultation bilaterally Heart: regular rate and rhythm, S1, S2 normal, no murmur, click, rub or gallop Extremities: extremities normal,  atraumatic, no cyanosis or edema Pulses: Decreased right pedal pulse Skin: Skin color, texture, turgor normal. No rashes or lesions Neurologic: Alert and oriented X 3, normal strength and tone. Normal symmetric reflexes. Normal coordination and gait  EKG sinus rhythm at 99 for R wave progression and inferior Q waves.  I personally reviewed this EKG.  ASSESSMENT AND PLAN:   Peripheral arterial disease Mr. Hallisey returns for follow-up of his lower extremity Doppler studies performed 08/15/2020.  He does have a history of diamondback orbital rotational atherectomy, PTA and stenting of his  right external iliac artery by myself 10/20/2013 with an excellent result.  Because of back and leg pain he underwent angiography by Dr. [name] 03/30/2018 and had to DBX stents placed at his iliac bifurcation.  He does complain of some numbness in the right side of his leg when he lies in bed but also right calf claudication.  Recent Doppler studies showed a right ABI of 0.26 and a left of 0.80.  He had monophasic iliac waveforms and significant disease in his right common femoral artery and SFA.  He wishes to proceed with angiography potential endovascular therapy.  He will need to stop his Eliquis 2 days before the procedure.      [name] MD FACP,FACC,FAHA, [facility] 08/17/2020 12:13 PM

## 2020-08-17 NOTE — Addendum Note (Signed)
Addended by: Bernita Buffy on: 08/17/2020 05:01 PM   Modules accepted: Orders, SmartSet

## 2020-08-17 NOTE — Assessment & Plan Note (Signed)
Albert Powell returns for follow-up of his lower extremity Doppler studies performed 08/15/2020.  He does have a history of diamondback orbital rotational atherectomy, PTA and stenting of his right external iliac artery by myself 10/20/2013 with an excellent result.  Because of back and leg pain he underwent angiography by Dr. [name] 03/30/2018 and had to DBX stents placed at his iliac bifurcation.  He does complain of some numbness in the right side of his leg when he lies in bed but also right calf claudication.  Recent Doppler studies showed a right ABI of 0.26 and a left of 0.80.  He had monophasic iliac waveforms and significant disease in his right common femoral artery and SFA.  He wishes to proceed with angiography potential endovascular therapy.  He will need to stop his Eliquis 2 days before the procedure.

## 2020-08-17 NOTE — Patient Instructions (Signed)
    [facility] CARDIOVASCULAR DIVISION [facility] [address] [place] [address] Dept: [phone] Loc: [phone]  MURREL BERTRAM  08/17/2020  You are scheduled for a Peripheral Angiogram on Monday, December 13 with Dr. Nanetta Batty.  1. Please arrive at the [facility] (Main Entrance A) at [facility]: [address] [place], [place] [address] at 11:30 AM (This time is two hours before your procedure to ensure your preparation). Free valet parking service is available.   Special note: Every effort is made to have your procedure done on time. Please understand that emergencies sometimes delay scheduled procedures.  2. Diet: Do not eat solid foods after midnight.  The patient may have clear liquids until 5am upon the day of the procedure.  3. Labs: You will need to have blood drawn on will be done today: BMET, CBC  4. Medication instructions in preparation for your procedure:   Stop taking Eliquis (Apixiban) on Friday, December 10.  On the morning of your procedure, take your Aspirin and Plavix/Clopidogrel and any morning medicines NOT listed above.  You may use sips of water.  5. Plan for one night stay--bring personal belongings. 6. Bring a current list of your medications and current insurance cards. 7. You MUST have a responsible person to drive you home. 8. Someone MUST be with you the first 24 hours after you arrive home or your discharge will be delayed. 9. Please wear clothes that are easy to get on and off and wear slip-on shoes.  You will need a COVID-19  test prior to your procedure. You are scheduled for Friday, 12/10 at 8:45am. This is a Drive Up Visit at [address] [place], [place] [address]. Someone will direct you to the appropriate testing line. Stay in your car and someone will be with you shortly.   Thank you for allowing [place] to care for you!   -- [facility]  [facility]  We will schedule follow up LEA doppler for a week after your procedure and an appointment with Dr. Allyson Sabal 2 weeks after procedure.

## 2020-08-18 LAB — CBC
Hematocrit: 43.5 % (ref 37.5–51.0)
Hemoglobin: 14.5 g/dL (ref 13.0–17.7)
MCH: 29.4 pg (ref 26.6–33.0)
MCHC: 33.3 g/dL (ref 31.5–35.7)
MCV: 88 fL (ref 79–97)
Platelets: 283 10*3/uL (ref 150–450)
RBC: 4.93 x10E6/uL (ref 4.14–5.80)
RDW: 13.2 % (ref 11.6–15.4)
WBC: 11.2 10*3/uL — ABNORMAL HIGH (ref 3.4–10.8)

## 2020-08-18 LAB — BASIC METABOLIC PANEL
BUN/Creatinine Ratio: 14 (ref 10–24)
BUN: 21 mg/dL (ref 8–27)
CO2: 26 mmol/L (ref 20–29)
Calcium: 10 mg/dL (ref 8.6–10.2)
Chloride: 93 mmol/L — ABNORMAL LOW (ref 96–106)
Creatinine, Ser: 1.48 mg/dL — ABNORMAL HIGH (ref 0.76–1.27)
GFR calc Af Amer: 52 mL/min/{1.73_m2} — ABNORMAL LOW (ref >59)
GFR calc non Af Amer: 45 mL/min/{1.73_m2} — ABNORMAL LOW (ref >59)
Glucose: 133 mg/dL — ABNORMAL HIGH (ref 65–99)
Potassium: 3.9 mmol/L (ref 3.5–5.2)
Sodium: 137 mmol/L (ref 134–144)

## 2020-08-20 ENCOUNTER — Telehealth: Payer: Self-pay | Admitting: *Deleted

## 2020-08-20 NOTE — Telephone Encounter (Signed)
Per Dr Allyson Sabal copied from 08/17/20 BMP results: SCr mod elevated. Hold Losartan HCTZ for 2 days prior to procedure and bring in early for prehydration  Lab results and recommendations discussed with patient-he is aware that he should arrive at [facility] 8:30 AM to allow time for hydration prior to procedure at 1:30 PM.

## 2020-08-23 ENCOUNTER — Telehealth: Payer: Self-pay | Admitting: *Deleted

## 2020-08-23 NOTE — Telephone Encounter (Signed)
Pt contacted pre-catheterization scheduled at [facility] for: Monday August 27, 2020 1:30 PM Verified arrival time and place: [facility] Main Entrance A [facility]) at: 8:30 AM -pre-procedure hydration    No solid food after midnight prior to cath, clear liquids until 5 AM day of procedure.  Hold: Losartan-HCT-day before and day of procedure -GFR 45 Eliquis-none 08/25/20 until post procedure Glipizide-AM of procedure  Except hold medications AM meds can be  taken pre-cath with sips of water including: ASA 81 mg Plavix 75 mg  Confirmed patient has responsible adult to drive home post procedure and be with patient first 24 hours after arriving home: yes  You are allowed ONE visitor in the waiting room during the time you are at the hospital for your procedure. Both you and your visitor must wear a mask once you enter the hospital.       COVID-19 Pre-Screening Questions:  . In the past 14 days have you had any symptoms concerning for COVID-19 infection (fever, chills, cough, or new shortness of breath)? no . In the past 14 days have you been around anyone with known Covid 19? no   Reviewed procedure/mask/visitor instructions, COVID-19 questions reviewed with patient.

## 2020-08-24 ENCOUNTER — Other Ambulatory Visit (HOSPITAL_COMMUNITY): Payer: Medicare Other

## 2020-08-24 ENCOUNTER — Other Ambulatory Visit (HOSPITAL_COMMUNITY)
Admission: RE | Admit: 2020-08-24 | Discharge: 2020-08-24 | Disposition: A | Discharge status: Home / Self Care | Payer: Medicare Other | Source: Ambulatory Visit | Attending: Cardiovascular Disease | Admitting: Cardiovascular Disease

## 2020-08-24 ENCOUNTER — Telehealth: Payer: Self-pay | Admitting: Cardiovascular Disease

## 2020-08-24 DIAGNOSIS — Z01812 Encounter for preprocedural laboratory examination: Secondary | ICD-10-CM | POA: Diagnosis not present

## 2020-08-24 DIAGNOSIS — Z20822 Contact with and (suspected) exposure to covid-19: Secondary | ICD-10-CM | POA: Insufficient documentation

## 2020-08-24 LAB — SARS CORONAVIRUS 2 (TAT 6-24 HRS): SARS Coronavirus 2: NEGATIVE

## 2020-08-24 NOTE — Telephone Encounter (Signed)
Patient states that he is having trouble getting his covid test prior to his procedure on Monday 12/13. He is requesting to speak with Dr. Hazle Coca nurse about this.

## 2020-08-24 NOTE — Telephone Encounter (Signed)
Pt states he was told he did not have an appointment and could not get his pre-procedure covid screening ahead of aniogram with Dr. Allyson Sabal next Monday. Discussion with the pt revealed that he had actually presented to the [facility] on [address] at his appointed time this morning, rather than to the actual [facility]Screening station. Made pt a new appointment for Covid swab today at 1:15. Reviewed with the patient that the testing site is stand-alone, NOT affiliated with [facility] and located at [address] Advised pt to arrive 15 minutes early and to bring insurance card. The patient verbalizes understanding and agreement with plan.

## 2020-08-27 ENCOUNTER — Other Ambulatory Visit: Payer: Self-pay

## 2020-08-27 ENCOUNTER — Ambulatory Visit (HOSPITAL_COMMUNITY)
Admission: RE | Admit: 2020-08-27 | Discharge: 2020-08-27 | Disposition: A | Discharge status: Home / Self Care | Payer: Medicare Other | Attending: Cardiovascular Disease | Admitting: Cardiovascular Disease

## 2020-08-27 ENCOUNTER — Encounter (HOSPITAL_COMMUNITY)
Admission: RE | Disposition: A | Discharge status: Home / Self Care | Payer: Self-pay | Source: Home / Self Care | Attending: Cardiovascular Disease

## 2020-08-27 DIAGNOSIS — Z79899 Other long term (current) drug therapy: Secondary | ICD-10-CM | POA: Diagnosis not present

## 2020-08-27 DIAGNOSIS — I70211 Atherosclerosis of native arteries of extremities with intermittent claudication, right leg: Secondary | ICD-10-CM | POA: Diagnosis not present

## 2020-08-27 DIAGNOSIS — E1151 Type 2 diabetes mellitus with diabetic peripheral angiopathy without gangrene: Secondary | ICD-10-CM | POA: Insufficient documentation

## 2020-08-27 DIAGNOSIS — Z888 Allergy status to other drugs, medicaments and biological substances status: Secondary | ICD-10-CM | POA: Insufficient documentation

## 2020-08-27 DIAGNOSIS — Z7984 Long term (current) use of oral hypoglycemic drugs: Secondary | ICD-10-CM | POA: Diagnosis not present

## 2020-08-27 DIAGNOSIS — F17210 Nicotine dependence, cigarettes, uncomplicated: Secondary | ICD-10-CM | POA: Insufficient documentation

## 2020-08-27 DIAGNOSIS — Z7901 Long term (current) use of anticoagulants: Secondary | ICD-10-CM | POA: Diagnosis not present

## 2020-08-27 DIAGNOSIS — Z7902 Long term (current) use of antithrombotics/antiplatelets: Secondary | ICD-10-CM | POA: Insufficient documentation

## 2020-08-27 DIAGNOSIS — Z885 Allergy status to narcotic agent status: Secondary | ICD-10-CM | POA: Insufficient documentation

## 2020-08-27 DIAGNOSIS — I739 Peripheral vascular disease, unspecified: Secondary | ICD-10-CM | POA: Diagnosis not present

## 2020-08-27 DIAGNOSIS — Z01810 Encounter for preprocedural cardiovascular examination: Secondary | ICD-10-CM

## 2020-08-27 HISTORY — PX: ABDOMINAL AORTOGRAM W/LOWER EXTREMITY: CATH118223

## 2020-08-27 LAB — GLUCOSE, CAPILLARY
Glucose-Capillary: 147 mg/dL — ABNORMAL HIGH (ref 70–99)
Glucose-Capillary: 107 mg/dL — ABNORMAL HIGH (ref 70–99)

## 2020-08-27 SURGERY — ABDOMINAL AORTOGRAM W/LOWER EXTREMITY
Anesthesia: LOCAL | Laterality: Bilateral

## 2020-08-27 MED ORDER — ASPIRIN 81 MG PO CHEW: 81.0000 mg | CHEWABLE_TABLET | ORAL | Status: DC

## 2020-08-27 MED ORDER — HYDRALAZINE HCL 20 MG/ML IJ SOLN
5.0000 mg | INTRAMUSCULAR | Status: DC | PRN
Start: 2020-08-27 — End: 2020-08-28

## 2020-08-27 MED ORDER — SODIUM CHLORIDE 0.9% FLUSH: 3.0000 mL | INTRAVENOUS | Status: DC | PRN

## 2020-08-27 MED ORDER — SODIUM CHLORIDE 0.9% FLUSH: 3.0000 mL | Freq: Two times a day (BID) | INTRAVENOUS | Status: DC

## 2020-08-27 MED ORDER — IODIXANOL 320 MG/ML IV SOLN
INTRAVENOUS | Status: DC | PRN
  Administered 2020-08-27: 15:00:00 75 mL via INTRA_ARTERIAL

## 2020-08-27 MED ORDER — LIDOCAINE HCL (PF) 1 % IJ SOLN
INTRAMUSCULAR | Status: DC | PRN
  Administered 2020-08-27: 15 mL via INTRADERMAL

## 2020-08-27 MED ORDER — SODIUM CHLORIDE 0.9 % IV SOLN: 250.0000 mL | INTRAVENOUS | Status: DC | PRN

## 2020-08-27 MED ORDER — LABETALOL HCL 5 MG/ML IV SOLN
10.0000 mg | INTRAVENOUS | Status: DC | PRN
  Administered 2020-08-27 (×2): 10 mg via INTRAVENOUS

## 2020-08-27 MED ORDER — MIDAZOLAM HCL 2 MG/2ML IJ SOLN
INTRAMUSCULAR | Status: AC
  Filled 2020-08-27: qty 2

## 2020-08-27 MED ORDER — ACETAMINOPHEN 325 MG PO TABS: 650.0000 mg | ORAL_TABLET | ORAL | Status: DC | PRN

## 2020-08-27 MED ORDER — SODIUM CHLORIDE 0.9 % WEIGHT BASED INFUSION
3.0000 mL/kg/h | INTRAVENOUS | Status: AC
  Administered 2020-08-27: 10:00:00 3 mL/kg/h via INTRAVENOUS

## 2020-08-27 MED ORDER — MORPHINE SULFATE (PF) 2 MG/ML IV SOLN: 2.0000 mg | INTRAVENOUS | Status: DC | PRN

## 2020-08-27 MED ORDER — SODIUM CHLORIDE 0.9 % IV SOLN: INTRAVENOUS | Status: AC

## 2020-08-27 MED ORDER — ASPIRIN EC 81 MG PO TBEC: 81.0000 mg | DELAYED_RELEASE_TABLET | Freq: Every day | ORAL | Status: DC

## 2020-08-27 MED ORDER — HEPARIN (PORCINE) IN NACL 1000-0.9 UT/500ML-% IV SOLN
INTRAVENOUS | Status: DC | PRN
  Administered 2020-08-27 (×2): 500 mL

## 2020-08-27 MED ORDER — LABETALOL HCL 5 MG/ML IV SOLN
INTRAVENOUS | Status: AC
  Filled 2020-08-27: qty 4

## 2020-08-27 MED ORDER — SODIUM CHLORIDE 0.9 % WEIGHT BASED INFUSION: 1.0000 mL/kg/h | INTRAVENOUS | Status: DC

## 2020-08-27 MED ORDER — MIDAZOLAM HCL 2 MG/2ML IJ SOLN
INTRAMUSCULAR | Status: DC | PRN
  Administered 2020-08-27: 1 mg via INTRAVENOUS

## 2020-08-27 MED ORDER — HEPARIN (PORCINE) IN NACL 1000-0.9 UT/500ML-% IV SOLN
INTRAVENOUS | Status: AC
  Filled 2020-08-27: qty 1000

## 2020-08-27 MED ORDER — ONDANSETRON HCL 4 MG/2ML IJ SOLN: 4.0000 mg | Freq: Four times a day (QID) | INTRAMUSCULAR | Status: DC | PRN

## 2020-08-27 MED ORDER — LIDOCAINE HCL (PF) 1 % IJ SOLN
INTRAMUSCULAR | Status: AC
  Filled 2020-08-27: qty 30

## 2020-08-27 SURGICAL SUPPLY — 14 items
CATH ANGIO 5F BER2 65CM (CATHETERS) ×2 IMPLANT
CATH ANGIO 5F PIGTAIL 65CM (CATHETERS) ×2 IMPLANT
CATH CROSS OVER TEMPO 5F (CATHETERS) ×2 IMPLANT
CATH STRAIGHT 5FR 65CM (CATHETERS) ×2 IMPLANT
GUIDEWIRE ANGLED .035X150CM (WIRE) ×2 IMPLANT
KIT PV (KITS) ×2 IMPLANT
SHEATH PINNACLE 5F 10CM (SHEATH) ×2 IMPLANT
SHEATH PROBE COVER 6X72 (BAG) ×2 IMPLANT
STOPCOCK MORSE 400PSI 3WAY (MISCELLANEOUS) ×2 IMPLANT
SYR MEDRAD MARK 7 150ML (SYRINGE) ×2 IMPLANT
TRANSDUCER W/STOPCOCK (MISCELLANEOUS) ×2 IMPLANT
TRAY PV CATH (CUSTOM PROCEDURE TRAY) ×2 IMPLANT
TUBING CIL FLEX 10 FLL-RA (TUBING) ×2 IMPLANT
WIRE HITORQ VERSACORE ST 145CM (WIRE) ×2 IMPLANT

## 2020-08-27 NOTE — Discharge Instructions (Signed)

## 2020-08-27 NOTE — Interval H&P Note (Signed)
History and Physical Interval Note:  08/27/2020 1:50 PM  [name]  has presented today for surgery, with the diagnosis of claudication.  The various methods of treatment have been discussed with the patient and family. After consideration of risks, benefits and other options for treatment, the patient has consented to  Procedure(s): ABDOMINAL AORTOGRAM W/LOWER EXTREMITY (Bilateral) as a surgical intervention.  The patient's history has been reviewed, patient examined, no change in status, stable for surgery.  I have reviewed the patient's chart and labs.  Questions were answered to the patient's satisfaction.     Nanetta Batty

## 2020-08-27 NOTE — Progress Notes (Signed)
Site area: Left groin a 5 french arterial sheath was removed  Site Prior to Removal:  Level 0  Pressure Applied For 20 MINUTES    Bedrest Beginning at 1545p   Manual:   Yes.    Patient Status During Pull:  stable  Post Pull Groin Site:  Level 0  Post Pull Instructions Given:  Yes.    Post Pull Pulses Present:  Yes.    Dressing Applied:  Yes.    Comments:

## 2020-08-27 NOTE — Progress Notes (Signed)
Report given to [facility] who will asume care at this time

## 2020-08-27 NOTE — Progress Notes (Signed)
Pt to restart plavix and Eliquis in am per Dr Allyson Sabal

## 2020-08-28 ENCOUNTER — Encounter (HOSPITAL_COMMUNITY): Payer: Self-pay | Admitting: Cardiovascular Disease

## 2020-09-03 ENCOUNTER — Ambulatory Visit (HOSPITAL_COMMUNITY): Payer: Medicare Other

## 2020-09-06 DIAGNOSIS — Z23 Encounter for immunization: Secondary | ICD-10-CM | POA: Diagnosis not present

## 2020-10-03 ENCOUNTER — Ambulatory Visit: Payer: Medicare HMO | Admitting: Cardiovascular Disease

## 2021-01-01 ENCOUNTER — Inpatient Hospital Stay (HOSPITAL_COMMUNITY)
Admission: EM | Admit: 2021-01-01 | Discharge: 2021-01-03 | DRG: 304 | Disposition: A | Discharge status: Home Health | Payer: Medicare Other | Attending: Internal Medicine | Admitting: Internal Medicine

## 2021-01-01 ENCOUNTER — Emergency Department (HOSPITAL_COMMUNITY): Payer: Medicare Other

## 2021-01-01 DIAGNOSIS — R0602 Shortness of breath: Secondary | ICD-10-CM

## 2021-01-01 DIAGNOSIS — Z885 Allergy status to narcotic agent status: Secondary | ICD-10-CM | POA: Diagnosis not present

## 2021-01-01 DIAGNOSIS — R519 Headache, unspecified: Secondary | ICD-10-CM | POA: Diagnosis not present

## 2021-01-01 DIAGNOSIS — Z8249 Family history of ischemic heart disease and other diseases of the circulatory system: Secondary | ICD-10-CM | POA: Diagnosis not present

## 2021-01-01 DIAGNOSIS — Z955 Presence of coronary angioplasty implant and graft: Secondary | ICD-10-CM

## 2021-01-01 DIAGNOSIS — J449 Chronic obstructive pulmonary disease, unspecified: Secondary | ICD-10-CM | POA: Diagnosis present

## 2021-01-01 DIAGNOSIS — F17210 Nicotine dependence, cigarettes, uncomplicated: Secondary | ICD-10-CM | POA: Diagnosis present

## 2021-01-01 DIAGNOSIS — I160 Hypertensive urgency: Secondary | ICD-10-CM

## 2021-01-01 DIAGNOSIS — J189 Pneumonia, unspecified organism: Secondary | ICD-10-CM | POA: Diagnosis not present

## 2021-01-01 DIAGNOSIS — Z7984 Long term (current) use of oral hypoglycemic drugs: Secondary | ICD-10-CM

## 2021-01-01 DIAGNOSIS — I161 Hypertensive emergency: Secondary | ICD-10-CM | POA: Diagnosis present

## 2021-01-01 DIAGNOSIS — I4891 Unspecified atrial fibrillation: Secondary | ICD-10-CM | POA: Diagnosis present

## 2021-01-01 DIAGNOSIS — I5033 Acute on chronic diastolic (congestive) heart failure: Secondary | ICD-10-CM | POA: Diagnosis present

## 2021-01-01 DIAGNOSIS — E1151 Type 2 diabetes mellitus with diabetic peripheral angiopathy without gangrene: Secondary | ICD-10-CM | POA: Diagnosis present

## 2021-01-01 DIAGNOSIS — I130 Hypertensive heart and chronic kidney disease with heart failure and stage 1 through stage 4 chronic kidney disease, or unspecified chronic kidney disease: Secondary | ICD-10-CM | POA: Diagnosis present

## 2021-01-01 DIAGNOSIS — R270 Ataxia, unspecified: Secondary | ICD-10-CM | POA: Diagnosis present

## 2021-01-01 DIAGNOSIS — Z888 Allergy status to other drugs, medicaments and biological substances status: Secondary | ICD-10-CM | POA: Diagnosis not present

## 2021-01-01 DIAGNOSIS — Z83438 Family history of other disorder of lipoprotein metabolism and other lipidemia: Secondary | ICD-10-CM | POA: Diagnosis not present

## 2021-01-01 DIAGNOSIS — I2510 Atherosclerotic heart disease of native coronary artery without angina pectoris: Secondary | ICD-10-CM | POA: Diagnosis present

## 2021-01-01 DIAGNOSIS — Z9114 Patient's other noncompliance with medication regimen: Secondary | ICD-10-CM

## 2021-01-01 DIAGNOSIS — K219 Gastro-esophageal reflux disease without esophagitis: Secondary | ICD-10-CM | POA: Diagnosis present

## 2021-01-01 DIAGNOSIS — I5031 Acute diastolic (congestive) heart failure: Secondary | ICD-10-CM

## 2021-01-01 DIAGNOSIS — Z9049 Acquired absence of other specified parts of digestive tract: Secondary | ICD-10-CM | POA: Diagnosis not present

## 2021-01-01 DIAGNOSIS — Z20822 Contact with and (suspected) exposure to covid-19: Secondary | ICD-10-CM | POA: Diagnosis present

## 2021-01-01 DIAGNOSIS — I169 Hypertensive crisis, unspecified: Secondary | ICD-10-CM | POA: Diagnosis present

## 2021-01-01 DIAGNOSIS — N1832 Chronic kidney disease, stage 3b: Secondary | ICD-10-CM | POA: Diagnosis present

## 2021-01-01 DIAGNOSIS — R42 Dizziness and giddiness: Secondary | ICD-10-CM | POA: Diagnosis not present

## 2021-01-01 DIAGNOSIS — J810 Acute pulmonary edema: Secondary | ICD-10-CM | POA: Diagnosis not present

## 2021-01-01 DIAGNOSIS — Z79899 Other long term (current) drug therapy: Secondary | ICD-10-CM | POA: Diagnosis not present

## 2021-01-01 DIAGNOSIS — Z825 Family history of asthma and other chronic lower respiratory diseases: Secondary | ICD-10-CM

## 2021-01-01 DIAGNOSIS — I10 Essential (primary) hypertension: Secondary | ICD-10-CM | POA: Diagnosis present

## 2021-01-01 DIAGNOSIS — I482 Chronic atrial fibrillation, unspecified: Secondary | ICD-10-CM | POA: Diagnosis present

## 2021-01-01 DIAGNOSIS — Z794 Long term (current) use of insulin: Secondary | ICD-10-CM

## 2021-01-01 DIAGNOSIS — Z833 Family history of diabetes mellitus: Secondary | ICD-10-CM

## 2021-01-01 DIAGNOSIS — Z66 Do not resuscitate: Secondary | ICD-10-CM | POA: Diagnosis present

## 2021-01-01 DIAGNOSIS — Z7902 Long term (current) use of antithrombotics/antiplatelets: Secondary | ICD-10-CM | POA: Diagnosis not present

## 2021-01-01 DIAGNOSIS — E119 Type 2 diabetes mellitus without complications: Secondary | ICD-10-CM

## 2021-01-01 DIAGNOSIS — E782 Mixed hyperlipidemia: Secondary | ICD-10-CM | POA: Diagnosis present

## 2021-01-01 DIAGNOSIS — E785 Hyperlipidemia, unspecified: Secondary | ICD-10-CM | POA: Diagnosis present

## 2021-01-01 LAB — RAPID URINE DRUG SCREEN, HOSP PERFORMED
Amphetamines: NOT DETECTED
Barbiturates: NOT DETECTED
Benzodiazepines: NOT DETECTED
Cocaine: NOT DETECTED
Opiates: NOT DETECTED
Tetrahydrocannabinol: NOT DETECTED

## 2021-01-01 LAB — TROPONIN I (HIGH SENSITIVITY)
Troponin I (High Sensitivity): 49 ng/L — ABNORMAL HIGH (ref <18)
Troponin I (High Sensitivity): 61 ng/L — ABNORMAL HIGH (ref <18)

## 2021-01-01 LAB — CBC WITH DIFFERENTIAL/PLATELET
Abs Immature Granulocytes: 0.07 10*3/uL (ref 0.00–0.07)
Basophils Absolute: 0 10*3/uL (ref 0.0–0.1)
Basophils Relative: 0 %
Eosinophils Absolute: 0.1 10*3/uL (ref 0.0–0.5)
Eosinophils Relative: 1 %
HCT: 43 % (ref 39.0–52.0)
Hemoglobin: 14.4 g/dL (ref 13.0–17.0)
Immature Granulocytes: 1 %
Lymphocytes Relative: 12 %
Lymphs Abs: 1.1 10*3/uL (ref 0.7–4.0)
MCH: 29 pg (ref 26.0–34.0)
MCHC: 33.5 g/dL (ref 30.0–36.0)
MCV: 86.7 fL (ref 80.0–100.0)
Monocytes Absolute: 0.7 10*3/uL (ref 0.1–1.0)
Monocytes Relative: 8 %
Neutro Abs: 7.2 10*3/uL (ref 1.7–7.7)
Neutrophils Relative %: 78 %
Platelets: 247 10*3/uL (ref 150–400)
RBC: 4.96 MIL/uL (ref 4.22–5.81)
RDW: 13.6 % (ref 11.5–15.5)
WBC: 9.2 10*3/uL (ref 4.0–10.5)
nRBC: 0 % (ref 0.0–0.2)

## 2021-01-01 LAB — RESP PANEL BY RT-PCR (FLU A&B, COVID) ARPGX2
Influenza A by PCR: NEGATIVE
Influenza B by PCR: NEGATIVE
SARS Coronavirus 2 by RT PCR: NEGATIVE

## 2021-01-01 LAB — COMPREHENSIVE METABOLIC PANEL
ALT: 26 U/L (ref 0–44)
AST: 19 U/L (ref 15–41)
Albumin: 3.2 g/dL — ABNORMAL LOW (ref 3.5–5.0)
Alkaline Phosphatase: 76 U/L (ref 38–126)
Anion gap: 12 (ref 5–15)
BUN: 11 mg/dL (ref 8–23)
CO2: 24 mmol/L (ref 22–32)
Calcium: 8.6 mg/dL — ABNORMAL LOW (ref 8.9–10.3)
Chloride: 99 mmol/L (ref 98–111)
Creatinine, Ser: 1.49 mg/dL — ABNORMAL HIGH (ref 0.61–1.24)
GFR, Estimated: 48 mL/min — ABNORMAL LOW (ref >60)
Glucose, Bld: 228 mg/dL — ABNORMAL HIGH (ref 70–99)
Potassium: 3.2 mmol/L — ABNORMAL LOW (ref 3.5–5.1)
Sodium: 135 mmol/L (ref 135–145)
Total Bilirubin: 1.1 mg/dL (ref 0.3–1.2)
Total Protein: 6.3 g/dL — ABNORMAL LOW (ref 6.5–8.1)

## 2021-01-01 LAB — BRAIN NATRIURETIC PEPTIDE: B Natriuretic Peptide: 1024.5 pg/mL — ABNORMAL HIGH (ref 0.0–100.0)

## 2021-01-01 LAB — URINALYSIS, ROUTINE W REFLEX MICROSCOPIC
Bacteria, UA: NONE SEEN
Bilirubin Urine: NEGATIVE
Glucose, UA: >=500 mg/dL — AB
Ketones, ur: NEGATIVE mg/dL
Leukocytes,Ua: NEGATIVE
Nitrite: NEGATIVE
Protein, ur: >=300 mg/dL — AB
Specific Gravity, Urine: 1.009 (ref 1.005–1.030)
pH: 6 (ref 5.0–8.0)

## 2021-01-01 LAB — PROTIME-INR
INR: 1 (ref 0.8–1.2)
Prothrombin Time: 13.2 seconds (ref 11.4–15.2)

## 2021-01-01 MED ORDER — ONDANSETRON HCL 4 MG PO TABS: 4.0000 mg | ORAL_TABLET | Freq: Four times a day (QID) | ORAL | Status: DC | PRN

## 2021-01-01 MED ORDER — ASPIRIN 81 MG PO CHEW
324.0000 mg | CHEWABLE_TABLET | Freq: Once | ORAL | Status: AC
  Administered 2021-01-01: 324 mg via ORAL
  Filled 2021-01-01: qty 4

## 2021-01-01 MED ORDER — ISOSORBIDE MONONITRATE ER 60 MG PO TB24
60.0000 mg | ORAL_TABLET | Freq: Every day | ORAL | Status: DC
  Administered 2021-01-02 – 2021-01-03 (×2): 60 mg via ORAL
  Filled 2021-01-01 (×2): qty 1

## 2021-01-01 MED ORDER — FUROSEMIDE 10 MG/ML IJ SOLN
40.0000 mg | Freq: Once | INTRAMUSCULAR | Status: AC
  Administered 2021-01-01: 40 mg via INTRAVENOUS
  Filled 2021-01-01: qty 4

## 2021-01-01 MED ORDER — ACETAMINOPHEN 325 MG PO TABS
650.0000 mg | ORAL_TABLET | Freq: Four times a day (QID) | ORAL | Status: DC | PRN
  Administered 2021-01-02 (×2): 650 mg via ORAL
  Filled 2021-01-01 (×2): qty 2

## 2021-01-01 MED ORDER — LOSARTAN POTASSIUM 50 MG PO TABS
100.0000 mg | ORAL_TABLET | Freq: Every day | ORAL | Status: DC
  Administered 2021-01-02 – 2021-01-03 (×2): 100 mg via ORAL
  Filled 2021-01-01 (×2): qty 2

## 2021-01-01 MED ORDER — ONDANSETRON HCL 4 MG/2ML IJ SOLN
4.0000 mg | Freq: Four times a day (QID) | INTRAMUSCULAR | Status: DC | PRN
Start: 2021-01-01 — End: 2021-01-03
  Administered 2021-01-02: 4 mg via INTRAVENOUS
  Filled 2021-01-01: qty 2

## 2021-01-01 MED ORDER — APIXABAN 5 MG PO TABS
5.0000 mg | ORAL_TABLET | Freq: Two times a day (BID) | ORAL | Status: DC
  Administered 2021-01-02 – 2021-01-03 (×4): 5 mg via ORAL
  Filled 2021-01-01 (×4): qty 1

## 2021-01-01 MED ORDER — ACETAMINOPHEN 650 MG RE SUPP: 650.0000 mg | Freq: Four times a day (QID) | RECTAL | Status: DC | PRN

## 2021-01-01 MED ORDER — POTASSIUM CHLORIDE 20 MEQ PO PACK
40.0000 meq | PACK | Freq: Once | ORAL | Status: AC
  Administered 2021-01-01: 40 meq via ORAL
  Filled 2021-01-01: qty 2

## 2021-01-01 MED ORDER — NITROGLYCERIN IN D5W 200-5 MCG/ML-% IV SOLN
0.0000 ug/min | INTRAVENOUS | Status: DC
  Administered 2021-01-01: 5 ug/min via INTRAVENOUS
  Filled 2021-01-01: qty 250

## 2021-01-01 MED ORDER — LABETALOL HCL 5 MG/ML IV SOLN
10.0000 mg | Freq: Once | INTRAVENOUS | Status: AC
  Administered 2021-01-01: 10 mg via INTRAVENOUS
  Filled 2021-01-01: qty 4

## 2021-01-01 MED ORDER — AMLODIPINE BESYLATE 5 MG PO TABS
5.0000 mg | ORAL_TABLET | Freq: Every day | ORAL | Status: DC
  Administered 2021-01-01 – 2021-01-03 (×3): 5 mg via ORAL
  Filled 2021-01-01 (×3): qty 1

## 2021-01-01 MED ORDER — ATORVASTATIN CALCIUM 40 MG PO TABS
40.0000 mg | ORAL_TABLET | Freq: Every day | ORAL | Status: DC
  Administered 2021-01-02 (×2): 40 mg via ORAL
  Filled 2021-01-01 (×2): qty 1

## 2021-01-01 MED ORDER — METOPROLOL TARTRATE 25 MG PO TABS
25.0000 mg | ORAL_TABLET | Freq: Two times a day (BID) | ORAL | Status: DC
  Administered 2021-01-02 – 2021-01-03 (×4): 25 mg via ORAL
  Filled 2021-01-01 (×4): qty 1

## 2021-01-01 MED ORDER — SODIUM CHLORIDE 0.9% FLUSH
3.0000 mL | Freq: Two times a day (BID) | INTRAVENOUS | Status: DC
  Administered 2021-01-02 – 2021-01-03 (×3): 3 mL via INTRAVENOUS

## 2021-01-01 MED ORDER — CLOPIDOGREL BISULFATE 75 MG PO TABS
75.0000 mg | ORAL_TABLET | Freq: Every day | ORAL | Status: DC
  Administered 2021-01-02 – 2021-01-03 (×2): 75 mg via ORAL
  Filled 2021-01-01 (×2): qty 1

## 2021-01-01 MED ORDER — INSULIN ASPART 100 UNIT/ML ~~LOC~~ SOLN
0.0000 [IU] | Freq: Three times a day (TID) | SUBCUTANEOUS | Status: DC
  Administered 2021-01-02: 1 [IU] via SUBCUTANEOUS
  Administered 2021-01-02: 2 [IU] via SUBCUTANEOUS
  Administered 2021-01-03 (×2): 1 [IU] via SUBCUTANEOUS

## 2021-01-01 MED ORDER — INSULIN ASPART 100 UNIT/ML ~~LOC~~ SOLN: 0.0000 [IU] | Freq: Every day | SUBCUTANEOUS | Status: DC

## 2021-01-01 NOTE — H&P (Signed)
History and Physical    Albert FabianJames W Louis NWG:[id number] DOB: April 22, 1941 DOA: 01/01/2021  PCP: Lorenda IshiharaVaradarajan, Rupashree, MD  Patient coming from: Home  I have personally briefly reviewed patient's old medical records [facility]  Chief Complaint: Shortness of breath  HPI: Albert Powell is a 80 y.o. male with medical history significant for chronic atrial fibrillation (previously on Eliquis, has not been taking recently), CAD s/p PCI with stenting, CAS s/p left CEA, CKD stage III, T2DM, HTN, HLD, PVD who presents to the ED for evaluation of gait instability.  Patient states that 1 week ago he had new severe left-sided headache.  Since then he has felt as if his gait has been unstable and off balance.  He has not fallen or lost consciousness.  He has had some lightheadedness but denies any room spinning sensation.  Today he had new onset of severe shortness of breath.  He has had cough associated with white sputum production.  He has not had any chest pain, subjective fevers, chills, diaphoresis, abdominal pain, dysuria.  He has had some nausea without emesis.  At time of admitting evaluation, patient states that his breathing his much better after initial treatment in the ED.  ED Course:  Initial vitals showed BP 203/120, pulse 111, RR 22, temp 98.6 F, SPO2 98% on room air.  Labs show sodium 135, potassium 3.2, bicarb 24, BUN 11, creatinine 1.49, serum glucose 228, LFTs within normal limits, WBC 9.2, hemoglobin 14.2, platelets 247,000, high-sensitivity troponin I 49 > 61, BNP 1024.5.  Urinalysis negative for UTI.  UDS negative.  SARS-CoV-2 PCR negative.  Portable chest x-ray showed cardiac enlargement with interstitial edema.  CT head without contrast was negative for evidence of acute intracranial abnormality.  Moderate chronic small vessel ischemic disease noted.  EDP consulted cardiology who evaluated patient.  Patient was given aspirin 324 mg, IV labetalol 10 mg x 2, and IV  Lasix 40 mg once.  Patient also started on nitroglycerin infusion with improvement in blood pressures.  EDP also discussed with on-call neurology who recommended obtaining MRI brain and continuing with blood pressure control.  If symptoms persist despite improved blood pressure then reconsult neurology.  The hospitalist service was consulted to admit for further evaluation and management.  Review of Systems: All systems reviewed and are negative except as documented in history of present illness above.   Past Medical History:  Diagnosis Date  . Atrial fibrillation ([name]) 03/30/2020  . Bradycardia, sinus 02/18/2013  . CAD (coronary artery disease)    stent to RCA 2003 also had 40% lesion at that time; NUCLEAR STRESS TEST, 01/16/2010 - normal  now with cath 80-90% stenosis in LAD, will try medical therapy if no improvement PCI  . Carotid artery disease ([name])   . Cataract   . Claudication ([name])    LEA DUPLEX, 07/07/2008 - Normal  . COPD (chronic obstructive pulmonary disease) ([name])   . DDD (degenerative disc disease) 2008  . Diabetes mellitus   . GERD (gastroesophageal reflux disease)   . H/O hiatal hernia   . History of colonoscopy 2004   finding of tics and AMV only no polpys  . Hyperlipidemia 02/05/2013  . Hypertension   . Onychomycosis   . Rosacea   . Tobacco abuse 02/05/2013    Past Surgical History:  Procedure Laterality Date  . ABDOMINAL AORTOGRAM W/LOWER EXTREMITY N/A 03/30/2018   Procedure: ABDOMINAL AORTOGRAM W/LOWER EXTREMITY;  Surgeon: Nada LibmanBrabham, Vance W, MD;  Location: [facility] INVAS[facility] LAB;  Service: Cardiovascular;  Laterality: N/A;  . ABDOMINAL AORTOGRAM W/LOWER EXTREMITY Bilateral 08/27/2020   Procedure: ABDOMINAL AORTOGRAM W/LOWER EXTREMITY;  Surgeon: [name], MD;  Location: [facility] CV LAB;  Service: Cardiovascular;  Laterality: Bilateral;  . APPENDECTOMY    . CARDIAC CATHETERIZATION  08/29/2002   2-vessel CAD with high-grade stenoses in RCA; stenting to RCA  .  CARDIAC CATHETERIZATION  2014   80-90% lesion will try medical therapy  . CARDIAC SURGERY     stents  2003  . CARDIOVERSION N/A 11/01/2013   Procedure: Dimple Nanas COMPRESSION;  Surgeon: [name], MD;  Location[facility] CATH LAB;  Service: Cardiovascular;  Laterality: N/A;  . CAROTID ANGIOGRAM N/A 04/25/2013   Procedure: CAROTID ANGIOGRAM;  Surgeon: [name], MD;  Location: [facility] CATH LAB;  Service: Cardiovascular;  Laterality: N/A;  . CAROTID ENDARTERECTOMY    . COLON RESECTION  3/04, 6/04    1 bleeding diverticulitis, 2 complete colectomy  . COLON SURGERY  2005   renal pouch rectal anastimosis  . CORONARY ANGIOPLASTY WITH STENT PLACEMENT  09/2002   2 stents to RCA  . ENDARTERECTOMY Left 06/02/2013   Procedure: ENDARTERECTOMY CAROTID-LEFT;  Surgeon: Nada Libman, MD;  Location: [facility] OR;  Service: Vascular;  Laterality: Left;  . INTRAVASCULAR PRESSURE WIRE/FFR STUDY N/A 04/02/2020   Procedure: INTRAVASCULAR PRESSURE WIRE/FFR STUDY;  Surgeon: [name], MD;  Location: [facility];  Service: Cardiovascular;  Laterality: N/A;  DFR - RCA  . LEFT HEART CATH AND CORONARY ANGIOGRAPHY N/A 04/02/2020   Procedure: LEFT HEART CATH AND CORONARY ANGIOGRAPHY;  Surgeon: [name], MD;  Location: [facility];  Service: Cardiovascular;  Laterality: N/A;  . LEFT HEART CATHETERIZATION WITH CORONARY ANGIOGRAM N/A 02/08/2013   Procedure: LEFT HEART CATHETERIZATION WITH CORONARY ANGIOGRAM;  Surgeon: [name], MD;  Location[facility] CATH LAB;  Service: Cardiovascular;  Laterality: N/A;  . LOWER EXTREMITY ANGIOGRAM N/A 10/20/2013   Procedure: LOWER EXTREMITY ANGIOGRAM;  Surgeon: [name], MD;  Location: [facility] CATH LAB;  Service: Cardiovascular;  Laterality: N/A;  . PATCH ANGIOPLASTY Left 06/02/2013   Procedure: PATCH ANGIOPLASTY;  Surgeon: Nada Libman, MD;  Location: [facility] OR;  Service: Vascular;  Laterality: Left;  . PERIPHERAL VASCULAR INTERVENTION Bilateral 03/30/2018    Procedure: PERIPHERAL VASCULAR INTERVENTION;  Surgeon: Nada Libman, MD;  Location: [facility] LAB;  Service: Cardiovascular;  Laterality: Bilateral;  common iliacs  . TONSILLECTOMY      Social History:  reports that he has been smoking cigarettes. He has a 42.75 pack-year smoking history. He quit smokeless tobacco use about 7 years ago. He reports current alcohol use of about 1.0 standard drink of alcohol per week. He reports that he does not use drugs.  Allergies  Allergen Reactions  . Fentanyl Other (See Comments)    Behavioral changes  . Gabapentin Other (See Comments)    ankle swells  . Lisinopril Other (See Comments)    unknown  . Lyrica [Pregabalin] Other (See Comments)    unknown  . Metformin Diarrhea  . Propofol Other (See Comments)    Heart rate dropped    Family History  Problem Relation Age of Onset  . Heart disease Father        heart attack at 32  . Heart attack Father   . Hyperlipidemia Father   . Colonic polyp Mother        that bled out  . COPD Mother   .  Diabetes Mother   . Heart disease Sister   . Colonic polyp Sister   . Stroke Maternal Grandfather   . Heart attack Paternal Grandfather   . Other Neg Hx        hypogonadism     Prior to Admission medications   Medication Sig Start Date End Date Taking? Authorizing Provider  atorvastatin (LIPITOR) 40 MG tablet Take 40 mg by mouth at bedtime.    Yes [provider]  OZEMPIC, 1 MG/DOSE, 2 MG/1.5ML SOPN Inject 2.5 mg into the skin once a week.  03/27/20  Yes [provider]  pantoprazole (PROTONIX) 40 MG tablet Take 40 mg by mouth daily.    Yes [provider]  Polyethyl Glycol-Propyl Glycol (SYSTANE FREE OP) Place 1 drop into both eyes at bedtime.   Yes [provider]    Physical Exam: Vitals:   01/01/21 1925 01/01/21 1930 01/01/21 1935 01/01/21 1940  BP: (!) 176/91 (!) 183/96 (!) 171/96 (!) 175/100  Pulse: 80 91 87 83  Resp: 20 (!) 26 (!) 29 19   Temp:      TempSrc:      SpO2: 91% 96% 98% 94%   Constitutional: Obese man resting in bed with head slightly elevated, NAD, calm, comfortable Eyes: PERRL, lids and conjunctivae normal ENMT: Mucous membranes are moist. Posterior pharynx clear of any exudate or lesions.Normal dentition.  Neck: normal, supple, no masses. Respiratory: clear to auscultation bilaterally, no wheezing, no crackles. Normal respiratory effort. No accessory muscle use.  Cardiovascular: Irregularly irregular, no murmurs / rubs / gallops.  Trace right lower extremity edema, no edema left lower extremity. 2+ pedal pulses. Abdomen: no tenderness, no masses palpated. Bowel sounds positive.  Musculoskeletal: no clubbing / cyanosis. No joint deformity upper and lower extremities. Good ROM, no contractures. Normal muscle tone.  Skin: no rashes, lesions, ulcers. No induration Neurologic: CN 2-12 grossly intact. Sensation intact. Strength 5/5 in all 4.  Psychiatric: Normal judgment and insight. Alert and oriented x 3. Normal mood.   Labs on Admission: I have personally reviewed following labs and imaging studies  CBC: Recent Labs  Lab 01/01/21 1314  WBC 9.2  NEUTROABS 7.2  HGB 14.4  HCT 43.0  MCV 86.7  PLT 247   Basic Metabolic Panel: Recent Labs  Lab 01/01/21 1314  NA 135  K 3.2*  CL 99  CO2 24  GLUCOSE 228*  BUN 11  CREATININE 1.49*  CALCIUM 8.6*   GFR: CrCl cannot be calculated (Unknown ideal weight.). Liver Function Tests: Recent Labs  Lab 01/01/21 1314  AST 19  ALT 26  ALKPHOS 76  BILITOT 1.1  PROT 6.3*  ALBUMIN 3.2*   No results for input(s): LIPASE, AMYLASE in the last 168 hours. No results for input(s): AMMONIA in the last 168 hours. Coagulation Profile: Recent Labs  Lab 01/01/21 1314  INR 1.0   Cardiac Enzymes: No results for input(s): CKTOTAL, CKMB, CKMBINDEX, TROPONINI in the last 168 hours. BNP (last 3 results) No results for input(s): PROBNP in the last 8760  hours. HbA1C: No results for input(s): HGBA1C in the last 72 hours. CBG: No results for input(s): GLUCAP in the last 168 hours. Lipid Profile: No results for input(s): CHOL, HDL, LDLCALC, TRIG, CHOLHDL, LDLDIRECT in the last 72 hours. Thyroid Function Tests: No results for input(s): TSH, T4TOTAL, FREET4, T3FREE, THYROIDAB in the last 72 hours. Anemia Panel: No results for input(s): VITAMINB12, FOLATE, FERRITIN, TIBC, IRON, RETICCTPCT in the last 72 hours. Urine analysis:  Component Value Date/Time   COLORURINE YELLOW 01/01/2021 1351   APPEARANCEUR CLEAR 01/01/2021 1351   LABSPEC 1.009 01/01/2021 1351   PHURINE 6.0 01/01/2021 1351   GLUCOSEU >=500 (A) 01/01/2021 1351   HGBUR SMALL (A) 01/01/2021 1351   BILIRUBINUR NEGATIVE 01/01/2021 1351   KETONESUR NEGATIVE 01/01/2021 1351   PROTEINUR >=300 (A) 01/01/2021 1351   UROBILINOGEN 1.0 05/31/2013 1432   NITRITE NEGATIVE 01/01/2021 1351   LEUKOCYTESUR NEGATIVE 01/01/2021 1351    Radiological Exams on Admission: CT Head Wo Contrast  Result Date: 01/01/2021 CLINICAL DATA:  Disequilibrium. Severe headache 3 days ago almost resulting in a fall with subsequent weakness. EXAM: CT HEAD WITHOUT CONTRAST TECHNIQUE: Contiguous axial images were obtained from the base of the skull through the vertex without intravenous contrast. COMPARISON:  03/30/2020 FINDINGS: Brain: There is no evidence of an acute infarct, intracranial hemorrhage, mass, midline shift, or extra-axial fluid collection. Patchy to confluent hypodensities in the cerebral white matter bilaterally have slightly progressed and are nonspecific but compatible with moderate chronic small vessel ischemic disease. There is mild-to-moderate cerebral atrophy. A chronic lacunar infarct in the right caudate nucleus is unchanged. Vascular: Calcified atherosclerosis at the skull base. No hyperdense vessel. Skull: No fracture or suspicious osseous lesion. Sinuses/Orbits: Paranasal sinuses and mastoid  air cells are clear. Bilateral cataract extraction. Other: None. IMPRESSION: 1. No evidence of acute intracranial abnormality. 2. Moderate chronic small vessel ischemic disease. Electronically Signed   By: Sebastian Ache M.D.   On: 01/01/2021 15:13   DG Chest Portable 1 View  Result Date: 01/01/2021 CLINICAL DATA:  Short of breath and dizziness EXAM: PORTABLE CHEST 1 VIEW COMPARISON:  03/30/2020 FINDINGS: Cardiac enlargement with mild vascular congestion. Perihilar and bibasilar airspace disease has developed since the prior study. No effusion. Atherosclerotic calcification aortic arch. Spinal cord stimulator midthoracic spine unchanged. IMPRESSION: Cardiac enlargement with perihilar and bilateral airspace disease suggestive of interstitial edema. Pneumonia also in the differential. Electronically Signed   By: Marlan Palau M.D.   On: 01/01/2021 14:25    EKG: Personally reviewed. A. fib, rate 123, PVC present.  Previous EKG showed sinus rhythm with PVC.  Assessment/Plan Principal Problem:   Hypertensive emergency Active Problems:   Type 2 diabetes mellitus ([name])   CAD hx of 2 stents to RCA in 2003,  80-90% stenosis of LAD/CFX June 2014- medical Rx   Hyperlipidemia   Benign essential hypertension   Atrial fibrillation with RVR ([name])   Albert Powell is a 80 y.o. male with medical history significant for chronic atrial fibrillation (previously on Eliquis, has not been taking recently), CAD s/p PCI with stenting, CAS s/p left CEA, CKD stage III, T2DM, HTN, HLD, PVD who is admitted with hypertensive emergency with pulmonary edema.  Hypertensive emergency with pulmonary edema: Likely in setting medication nonadherence.  Respiratory symptoms improving with initial management.  Blood pressure still elevated but better than arrival to ED. -Continue nitroglycerin infusion, titrate off with the introduction of home meds as below -Restart amlodipine 5 mg daily, Imdur 60 mg daily, losartan 100 mg daily,  Lopressor 25 mg twice daily -Monitor strict I/O's and daily weights -Redose Lasix as needed  Chronic atrial fibrillation with RVR: Rates improving after initial management, now maintaining in the 90s.  Resume Lopressor 25 mg twice daily and Eliquis 5 mg twice daily.  Ataxia/gait instability: Suspect related to hypertensive emergency.  CT brain without acute changes.  Follow MRI brain and monitor for neuro changes when blood pressure more well controlled.  PT/OT  eval.  CAD s/p PCI with stenting: Clinically stable.  Denies any chest pain.  Troponin elevated likely due to demand ischemia.  Continue Lopressor, Imdur, atorvastatin.  Type 2 diabetes: Start SSI, check A1c.  PVD: Continue Plavix, atorvastatin.  CKD stage III: Stable, continue to monitor.  DVT prophylaxis: Eliquis Code Status: DNR, confirmed with patient Family Communication: Discussed with patient, he has discussed with family Disposition Plan: From home, dispo pending clinical progress Consults called: Cardiology Level of care: Progressive Admission status:  Status is: Observation  The patient remains OBS appropriate and will d/c before 2 midnights.  Dispo: The patient is from: Home              Anticipated d/c is to: Home              Patient currently is not medically stable to d/c.   Difficult to place patient No  Darreld Mclean MD [facility]  If 7PM-7AM, please contact night-coverage [email or web address]  01/01/2021, 8:03 PM

## 2021-01-01 NOTE — Consult Note (Addendum)
Cardiology Consultation:   Patient ID: BRENTEN JANNEY MRN: [id number]; DOB: Oct 21, 1940  Admit date: 01/01/2021 Date of Consult: 01/01/2021  PCP:  [name], MD   [facility]  Cardiologist:  Nanetta Batty, MD    Patient Profile:   Albert Powell is a 80 y.o. male with a hx of PAF, CAD s/p stenting x2 to RCA in 2003 with residual LAD disease, carotid artery disease s/p carotid artery stenosis s/p LICA endarterectomy 04/2013, peripheral arterial disease, hypertension, hyperlipidemia, and DM2 who is being seen today for the evaluation of CHF at the request of Dr. Lynelle Doctor.  Last cath 03/2020 showed  patent proximal RCA stent with an intermediate lesion in the 70% range with negative DFR just beyond this in the mid RCA.  Otherwise he has noncritical CAD.   He underwent carotid Doppler studies 04/25/2013 which showed 95% proximal LICA and therefore underwent endarterectomy per Dr. [name].   He does have a history of diamondback orbital rotational atherectomy, PTA and stenting of his right external iliac artery by Dr. Allyson Sabal  10/20/2013 with an excellent result.  Because of back and leg pain he underwent angiography by Dr. [name] 03/30/2018 and had to DBX stents placed at his iliac bifurcation.   Last PV angiogram 08/2020  IMPRESSION: Albert Powell has patent VBX stents.  He has high-grade exophytic calcific disease in his distal right external neck artery and right common femoral artery which will need to be treated surgically with endarterectomy and patch angioplasty.  He also has a high-grade exophytic calcific plaque in his mid left SFA which can be treated with orbital atherectomy plus or minus drug-coated balloon angioplasty plus or minus stenting.  I used a total of 75 cc of contrast.  The diagnostic catheter was removed.  The sheath will be removed and pressure held.  Patient will be hydrated and discharged home as an outpatient.  We will see him back in the  next several weeks for follow-up at which time I will arrange surgical consultation.  He left the lab in stable condition.   History of Present Illness:   Albert Powell presented with progressive worsening weakness, "wobbly feeling" and dyspnea on exertion.  He stopped taking his all medications 4 months ago for unknown reason.  He cannot explain.  He never followed up with vascular after last PV angiogram with Dr. Allyson Sabal.  He denies chest pain, palpitation, dizziness, orthopnea, PND, syncope, lower extremity edema or melena.  His main complaint is weakness.  Due to ongoing symptoms he presented to ER for further evaluation.  He lives with his wife.  Hypertensive 203/120 BNP 1024 Hs-troponin 49 K 3.2 Scr 1.49 Hgb 14.4 Respiratory panel negative for influenza and COVID CT of head without acute findings Chest X-ray with interstitial edema vs pneumonia   Patient is given IV labetalol 10 mg x 2  Cardiology is asked for further evaluation.  Past Medical History:  Diagnosis Date  . Atrial fibrillation ([name]) 03/30/2020  . Bradycardia, sinus 02/18/2013  . CAD (coronary artery disease)    stent to RCA 2003 also had 40% lesion at that time; NUCLEAR STRESS TEST, 01/16/2010 - normal  now with cath 80-90% stenosis in LAD, will try medical therapy if no improvement PCI  . Carotid artery disease ([name])   . Cataract   . Claudication ([name])    LEA DUPLEX, 07/07/2008 - Normal  . COPD (chronic obstructive pulmonary disease) ([name])   . DDD (degenerative disc disease) 2008  . Diabetes  mellitus   . GERD (gastroesophageal reflux disease)   . H/O hiatal hernia   . History of colonoscopy 2004   finding of tics and AMV only no polpys  . Hyperlipidemia 02/05/2013  . Hypertension   . Onychomycosis   . Rosacea   . Tobacco abuse 02/05/2013    Past Surgical History:  Procedure Laterality Date  . ABDOMINAL AORTOGRAM W/LOWER EXTREMITY N/A 03/30/2018   Procedure: ABDOMINAL AORTOGRAM W/LOWER EXTREMITY;  Surgeon:  Nada Libman, MD;  Location: [facility];  Service: Cardiovascular;  Laterality: N/A;  . ABDOMINAL AORTOGRAM W/LOWER EXTREMITY Bilateral 08/27/2020   Procedure: ABDOMINAL AORTOGRAM W/LOWER EXTREMITY;  Surgeon: [name], MD;  Location: [facility];  Service: Cardiovascular;  Laterality: Bilateral;  . APPENDECTOMY    . CARDIAC CATHETERIZATION  08/29/2002   2-vessel CAD with high-grade stenoses in RCA; stenting to RCA  . CARDIAC CATHETERIZATION  2014   80-90% lesion will try medical therapy  . CARDIAC SURGERY     stents  2003  . CARDIOVERSION N/A 11/01/2013   Procedure: Dimple Nanas COMPRESSION;  Surgeon: [name], MD;  Location: [facility] CATH LAB;  Service: Cardiovascular;  Laterality: N/A;  . CAROTID ANGIOGRAM N/A 04/25/2013   Procedure: CAROTID ANGIOGRAM;  Surgeon: [name], MD;  Location: [facility] CATH LAB;  Service: Cardiovascular;  Laterality: N/A;  . CAROTID ENDARTERECTOMY    . COLON RESECTION  3/04, 6/04    1 bleeding diverticulitis, 2 complete colectomy  . COLON SURGERY  2005   renal pouch rectal anastimosis  . CORONARY ANGIOPLASTY WITH STENT PLACEMENT  09/2002   2 stents to RCA  . ENDARTERECTOMY Left 06/02/2013   Procedure: ENDARTERECTOMY CAROTID-LEFT;  Surgeon: Nada Libman, MD;  Location: [facility] OR;  Service: Vascular;  Laterality: Left;  . INTRAVASCULAR PRESSURE WIRE/FFR STUDY N/A 04/02/2020   Procedure: INTRAVASCULAR PRESSURE WIRE/FFR STUDY;  Surgeon: [name], MD;  Location: [facility];  Service: Cardiovascular;  Laterality: N/A;  DFR - RCA  . LEFT HEART CATH AND CORONARY ANGIOGRAPHY N/A 04/02/2020   Procedure: LEFT HEART CATH AND CORONARY ANGIOGRAPHY;  Surgeon: [name], MD;  Location: [facility];  Service: Cardiovascular;  Laterality: N/A;  . LEFT HEART CATHETERIZATION WITH CORONARY ANGIOGRAM N/A 02/08/2013   Procedure: LEFT HEART CATHETERIZATION WITH CORONARY ANGIOGRAM;  Surgeon: [name], MD;  Location:  [facility] CATH LAB;  Service: Cardiovascular;  Laterality: N/A;  . LOWER EXTREMITY ANGIOGRAM N/A 10/20/2013   Procedure: LOWER EXTREMITY ANGIOGRAM;  Surgeon: [name], MD;  Location: [facility] CATH LAB;  Service: Cardiovascular;  Laterality: N/A;  . PATCH ANGIOPLASTY Left 06/02/2013   Procedure: PATCH ANGIOPLASTY;  Surgeon: Nada Libman, MD;  Location: [facility] OR;  Service: Vascular;  Laterality: Left;  . PERIPHERAL VASCULAR INTERVENTION Bilateral 03/30/2018   Procedure: PERIPHERAL VASCULAR INTERVENTION;  Surgeon: Nada Libman, MD;  Location: [facility];  Service: Cardiovascular;  Laterality: Bilateral;  common iliacs  . TONSILLECTOMY      Inpatient Medications: Scheduled Meds: . aspirin  324 mg Oral Once  . furosemide  40 mg Intravenous Once  . sodium chloride flush  3 mL Intravenous Q12H   Continuous Infusions: . nitroGLYCERIN     PRN Meds:   Allergies:    Allergies  Allergen Reactions  . Fentanyl Other (See Comments)    Behavioral changes  . Gabapentin Other (See Comments)    ankle swells  . Lisinopril Other (See Comments)  unknown  . Lyrica [Pregabalin] Other (See Comments)    unknown  . Metformin Diarrhea  . Propofol Other (See Comments)    Heart rate dropped    Social History:   Social History   Socioeconomic History  . Marital status: Married    Spouse name: Not on file  . Number of children: Not on file  . Years of education: Not on file  . Highest education level: Not on file  Occupational History  . Occupation: retired  Tobacco Use  . Smoking status: Current Every Day Smoker    Packs/day: 0.75    Years: 57.00    Pack years: 42.75    Types: Cigarettes    Last attempt to quit: 04/06/2013    Years since quitting: 7.7  . Smokeless tobacco: Former NeurosurgeonUser    Quit date: 04/14/2013  . Tobacco comment: pt states that he is using the vapor cigs  Vaping Use  . Vaping Use: Never used  Substance and Sexual Activity  . Alcohol use: Yes    Alcohol/week: 1.0  standard drink    Types: 1 Glasses of wine per week    Comment: "very little"  . Drug use: No  . Sexual activity: Not on file  Other Topics Concern  . Not on file  Social History Narrative  . Not on file   Social Determinants of Health   Financial Resource Strain: Not on file  Food Insecurity: Not on file  Transportation Needs: Not on file  Physical Activity: Not on file  Stress: Not on file  Social Connections: Not on file  Intimate Partner Violence: Not on file    Family History:   Family History  Problem Relation Age of Onset  . Heart disease Father        heart attack at 5359  . Heart attack Father   . Hyperlipidemia Father   . Colonic polyp Mother        that bled out  . COPD Mother   . Diabetes Mother   . Heart disease Sister   . Colonic polyp Sister   . Stroke Maternal Grandfather   . Heart attack Paternal Grandfather   . Other Neg Hx        hypogonadism     ROS:  Please see the history of present illness.  All other ROS reviewed and negative.     Physical Exam/Data:   Vitals:   01/01/21 1330 01/01/21 1400 01/01/21 1415 01/01/21 1558  BP: (!) 206/128 (!) 184/119 (!) 203/130 (!) 198/78  Pulse: (!) 127 98 97 94  Resp: (!) 24 (!) 30 (!) 24 18  Temp:    98.8 F (37.1 C)  TempSrc:      SpO2: 94% 96% 92% 97%   No intake or output data in the 24 hours ending 01/01/21 1655 Last 3 Weights 08/27/2020 08/17/2020 07/31/2020  Weight (lbs) 230 lb 231 lb 9.6 oz 231 lb 9.6 oz  Weight (kg) 104.327 kg 105.053 kg 105.053 kg     There is no height or weight on file to calculate BMI.  General:  Well nourished, well developed, in no acute distress HEENT: normal Lymph: no adenopathy Neck: no JVD Endocrine:  No thryomegaly Vascular: No carotid bruits; FA pulses 2+ bilaterally without bruits  Cardiac:  normal S1, S2; RRR; no murmur  Lungs:  Faint  rales  Abd: soft, nontender, no hepatomegaly  Ext: no edema Musculoskeletal:  No deformities, BUE and BLE strength normal  and equal Skin: warm  and dry  Neuro:  CNs 2-12 intact, no focal abnormalities noted Psych:  Normal affect   EKG:  The EKG was personally reviewed and demonstrates: AFib at rate of 123 bpm, PVCs Telemetry:  Telemetry was personally reviewed and demonstrates:  AFib 110>>90s Relevant CV Studies:   Echo 03/2020 1. Left ventricular ejection fraction, by estimation, is 55 to 60%. The  left ventricle has normal function. The left ventricle has no regional  wall motion abnormalities. Left ventricular diastolic parameters are  consistent with Grade I diastolic  dysfunction (impaired relaxation).  2. Right ventricular systolic function is normal. The right ventricular  size is normal.  3. The mitral valve is normal in structure. No evidence of mitral valve  regurgitation. No evidence of mitral stenosis.  4. The aortic valve is tricuspid. Aortic valve regurgitation is not  visualized. Mild aortic valve sclerosis is present, with no evidence of  aortic valve stenosis.  5. The inferior vena cava is normal in size with greater than 50%  respiratory variability, suggesting right atrial pressure of 3 mmHg.   Cath 03/2020  INTRAVASCULAR PRESSURE WIRE/FFR STUDY  LEFT HEART CATH AND CORONARY ANGIOGRAPHY    Conclusion    Previously placed Ost RCA to Prox RCA stent (unknown type) is widely patent.  Prox RCA to Mid RCA lesion is 70% stenosed.  Prox LAD to Mid LAD lesion is 50% stenosed.  Prox Cx to Mid Cx lesion is 40% stenosed.   SEVAG SHEARN is a 80 y.o. male    [id number] LOCATION:  FACILITY: [facility]  PHYSICIAN: Nanetta Batty, M.D. 1941-09-04   DATE OF PROCEDURE:  04/02/2020  DATE OF DISCHARGE:     CARDIAC CATHETERIZATION     History obtained from chart review.80 y.o.malew/ a h/o CAD s/p stenting to the RCAx2 in 2003 with nonischemic stress testing in 2014, carotid arterial disease status post left carotid endarterectomy in August 2014, PAD status post  bilateral iliac stenting in July 2019, hypertension, hyperlipidemia,OSA (untreated),and type 2 diabetes mellitus, presented with A. fib with RVR and chest pain on July 16.  His enzymes were negative.  His EKG showed no acute changes.  His LV function was normal.  He presents now for diagnostic coronary angiography to rule out an ischemic etiology.   PROCEDURE DESCRIPTION:   The patient was brought to the second floor [place] [facility]iac cath lab in the postabsorptive state. He was premedicated with IV Versed. His right wrist was prepped and shaved in usual sterile fashion. Xylocaine 1% was used for local anesthesia. A 6 French sheath was inserted into the right radial  artery using standard Seldinger technique. The patient received 5000 units  of heparin intravenously.  A 5 [place] TIG catheter pigtail catheter used for selective coronary angiography and obtain left heart pressures.  Isovue dye was used for the entirety of the case.  Retrograde aorta, ventricular and pullback pressures were recorded.  Radial cocktail was administered via the SideArm sheath.  Because of the intermediate nature of the mid dominant RCA stenosis just beyond the previous stent I elected to perform DFR.  The patient received an additional 6 units of heparin with an ACT greater than 250.  Using a 6 [place] JR4 guide catheter along with a comet DFR wire I equalized, across the lesion and the DFR was 0.96 suggesting the lesion was not physiologically significant.    IMPRESSION: Albert Powell has a patent proximal RCA stent with an intermediate lesion in the 70% range with negative DFR just  beyond this in the mid RCA.  Otherwise he has noncritical CAD.  I suspect his chest pain was related to his A. fib with RVR.  Enzymes were negative.  Medical therapy will be recommended including a DOAC for his A. fib.  The sheath was removed and a TR band was placed on the right wrist to achieve patent hemostasis.  The patient left lab  in stable condition.   Laboratory Data:  High Sensitivity Troponin:   Recent Labs  Lab 01/01/21 1314  TROPONINIHS 49*     Chemistry Recent Labs  Lab 01/01/21 1314  NA 135  K 3.2*  CL 99  CO2 24  GLUCOSE 228*  BUN 11  CREATININE 1.49*  CALCIUM 8.6*  GFRNONAA 48*  ANIONGAP 12    Recent Labs  Lab 01/01/21 1314  PROT 6.3*  ALBUMIN 3.2*  AST 19  ALT 26  ALKPHOS 76  BILITOT 1.1   Hematology Recent Labs  Lab 01/01/21 1314  WBC 9.2  RBC 4.96  HGB 14.4  HCT 43.0  MCV 86.7  MCH 29.0  MCHC 33.5  RDW 13.6  PLT 247   BNP Recent Labs  Lab 01/01/21 1350  BNP 1,024.5*    DDimer No results for input(s): DDIMER in the last 168 hours.   Radiology/Studies:  CT Head Wo Contrast  Result Date: 01/01/2021 CLINICAL DATA:  Disequilibrium. Severe headache 3 days ago almost resulting in a fall with subsequent weakness. EXAM: CT HEAD WITHOUT CONTRAST TECHNIQUE: Contiguous axial images were obtained from the base of the skull through the vertex without intravenous contrast. COMPARISON:  03/30/2020 FINDINGS: Brain: There is no evidence of an acute infarct, intracranial hemorrhage, mass, midline shift, or extra-axial fluid collection. Patchy to confluent hypodensities in the cerebral white matter bilaterally have slightly progressed and are nonspecific but compatible with moderate chronic small vessel ischemic disease. There is mild-to-moderate cerebral atrophy. A chronic lacunar infarct in the right caudate nucleus is unchanged. Vascular: Calcified atherosclerosis at the skull base. No hyperdense vessel. Skull: No fracture or suspicious osseous lesion. Sinuses/Orbits: Paranasal sinuses and mastoid air cells are clear. Bilateral cataract extraction. Other: None. IMPRESSION: 1. No evidence of acute intracranial abnormality. 2. Moderate chronic small vessel ischemic disease. Electronically Signed   By: Sebastian Ache M.D.   On: 01/01/2021 15:13   DG Chest Portable 1 View  Result Date:  01/01/2021 CLINICAL DATA:  Short of breath and dizziness EXAM: PORTABLE CHEST 1 VIEW COMPARISON:  03/30/2020 FINDINGS: Cardiac enlargement with mild vascular congestion. Perihilar and bibasilar airspace disease has developed since the prior study. No effusion. Atherosclerotic calcification aortic arch. Spinal cord stimulator midthoracic spine unchanged. IMPRESSION: Cardiac enlargement with perihilar and bilateral airspace disease suggestive of interstitial edema. Pneumonia also in the differential. Electronically Signed   By: Marlan Palau M.D.   On: 01/01/2021 14:25     Assessment and Plan:   1. Atrial fibrillation with rapid ventricular rate His heart rate was 110-119 on arrival, now improved to 90s after IV labetalol  2. CAD  - no chest pain. He reports DOE.  - Troponin pending   3.  Volume overload with suspected acute diastolic heart failure in setting of noncompliance -Echocardiogram July 2021 with preserved LV function and grade 1 diastolic dysfunction -BNP almost 1000 - Chest x-ray with interstitial edema versus pneumonia -Could be due to elevated blood pressure -We will give 1 dose of IV Lasix  4.  Hypertensive urgency -In setting of noncompliance with antihypertensive -Blood pressure improving after IV  labetalol -Will resume home medication  5.  Diabetes mellitus -Check hemoglobin A1c -Sliding scale insulin while admitted  6. PVD - Continue Plavix  7. HLD - Check lipid panel in AM   Continue home amlodipine 5 mg daily, Eliquis 5 mg twice daily, Lipitor 40 mg daily, Plavix 75 mg daily, isosorbide 60 mg daily, losartan 100 mg daily and metoprolol 25 mg twice daily.  Hold hydrochlorothiazide as patient will need Lasix during this admission initially.  Risk Assessment/Risk Scores:   New York Heart Association (NYHA) Functional Class NYHA Class II  CHA2DS2-VASc Score = 6  This indicates a 9.7% annual risk of stroke. The patient's score is based upon: CHF History:  Yes HTN History: Yes Diabetes History: Yes Stroke History: No Vascular Disease History: Yes Age Score: 2 Gender Score: 0     For questions or updates, please contact [facility] Please consult [email or web address] for contact info under    Lorelei Pont, PA  01/01/2021 4:55 PM   History and all data above reviewed.  Patient examined.  I agree with the findings as above.  The patient presented primarily because he thought he was having a stroke.  He had some facial pain.  We are called because he has some evidence of pulmonary edema on chest x-ray.  Hypertensive urgency.  He has a history of peripheral vascular disease, remote coronary artery disease and chronic atrial fibrillation.  He has hypertension.  He has had problems with his gait.  He has had pain with walking particular in his right leg for some time.  He thinks he has some hip problems and back problems.  He also has peripheral vascular disease and was to follow-up with vascular surgery for potential revascularization but he did not do this.  For reasons that he cannot explain he stopped this medication several months ago.  His wife thinks he just "gave up."  He denies any chest pressure, neck or arm discomfort.  He does not really notice his fibrillation.  He denies any presyncope or syncope.  He has chronic dyspnea with exertion but is not describing new PND or orthopnea.  He has some mild chronic lower extremity swelling.  The patient exam reveals COR: Irregular rate and rhythm, distant heart sounds, no obvious murmurs.  Lungs: Decreased breath sounds bilaterally, no wheezing, no crackles,  XNT:[id number] bowel sounds, no rebound no guarding Ext: Mild bilateral lower extremity swelling.  All available labs, radiology testing, previous records reviewed. Agree with documented assessment and plan.   Acute diastolic HF: He has evidence of volume overload.  BNP is elevated.  He does have some edema on chest x-ray.  I think most of  this is related to his hypertensive urgency and the fact that he stopped taking his medications.   Certainly agree with the IV Lasix that was given and I suspect we will be able to switch quickly to oral.  He simply needs to restart the medications as below.  HTN: He has been given IV labetalol with some improvement in his blood pressure.  Nitroglycerin has been ordered.    I would suggest resuming his medications other than hydrochlorothiazide.  I suspect his blood pressure will be controlled with this regimen if he would take it.  CKD 3b:  We need to watch his creatinine closely.  He is actually below his baseline creatinine currently.  Atrial Fib: We talked about this and resuming his anticoagulation along with his beta-blocker for rate control.   Rollene Rotunda  5:45 PM  01/01/2021

## 2021-01-01 NOTE — ED Triage Notes (Signed)
Pt here from home with c/o gen weakness and h/a  That started this past sat , pt is on afib 120 , and hypertensive

## 2021-01-01 NOTE — ED Triage Notes (Signed)
Emergency Medicine Provider Triage Evaluation Note  Albert Powell , a 80 y.o. male  was evaluated in triage.  Pt complains  Of weakness. States that he had a severe HA 3 days ago, and then almost  fell over and has been weak since then. No prior stroke. Is on thinners. No focal neuro deficits.   Review of Systems  Positive: Weakness, HA  Negative: Aphasia, neglect, facial droop, CP, SOB  Physical Exam  There were no vitals taken for this visit. Gen:   Awake, no distress   HEENT:  Atraumatic  Resp:  Normal effort * Cardiac:  Irregular, tachy Abd:   Nondistended, nontender  MSK:   Moves extremities without difficulty  Neuro:  CN 2-12 grossly intact, NO facial droop. Normal speech. Normal strength to upper and lower. Negative pronator drift.   Medical Decision Making  Medically screening exam initiated at 12:24 PM.  Appropriate orders placed.  Albert Powell was informed that the remainder of the evaluation will be completed by another provider, this initial triage assessment does not replace that evaluation, and the importance of remaining in the ED until their evaluation is complete.  Clinical Impression  Pt with weakness, in a fib rate 123 Pt to be next, [place] aware.   MSE was initiated and I personally evaluated the patient and placed orders (if any) at  12:31 PM on January 01, 2021.  The patient appears stable so that the remainder of the MSE may be completed by another provider.    Farrel Gordon, PA-C 01/01/21 1232

## 2021-01-01 NOTE — ED Provider Notes (Addendum)
[facility] EMERGENCY DEPARTMENT Provider Note   CSN: [id number] Arrival date & time: 01/01/21  1213     History No chief complaint on file.   Albert Powell is a 80 y.o. male presents with concern for sensation of disequilibrium and generalized body weakness that started on Saturday immediately following severe left-sided headache which she describes as intense pressure behind his eye.  He has not had recurrence of that headache since that time but has endorsed whole body weakness.  Additionally his wife endorses that he has been much more emotionally labile since that time.  Of note patient is post to be on Eliquis for anticoagulation for A. fib, however states he stopped taking that approximately 4 months ago but is not sure why.  His wife states she was unaware that he did so.  Additionally patient cannot longer remember the names of his medications or whether or not he has taken them, which his wife says is new for him as he usually manages his own medications.  He denies any blurry vision or double vision, denies pain in the eye itself, denies chest pain, or palpitations but does endorse new shortness of breath on exertion. According his wife he never experienced any facial droop, slurring of his speech, or inability to walk but has been a significantly more unsteady on his feet than she feels is normal for him. Wife feels that he has been more forgetful and confused for the last 4 weeks.  I have reviewed his medial records. Patient with hx of CAD, LVEF on echo in 03/2020 revealed LVEF of 55-60%. Patient has both eliquis and plavix listed in his chart but states he is not taking either.    HPI     Past Medical History:  Diagnosis Date  . Atrial fibrillation ([name]) 03/30/2020  . Bradycardia, sinus 02/18/2013  . CAD (coronary artery disease)    stent to RCA 2003 also had 40% lesion at that time; NUCLEAR STRESS TEST, 01/16/2010 - normal  now with cath 80-90% stenosis in  LAD, will try medical therapy if no improvement PCI  . Carotid artery disease ([name])   . Cataract   . Claudication ([name])    LEA DUPLEX, 07/07/2008 - Normal  . COPD (chronic obstructive pulmonary disease) ([name])   . DDD (degenerative disc disease) 2008  . Diabetes mellitus   . GERD (gastroesophageal reflux disease)   . H/O hiatal hernia   . History of colonoscopy 2004   finding of tics and AMV only no polpys  . Hyperlipidemia 02/05/2013  . Hypertension   . Onychomycosis   . Rosacea   . Tobacco abuse 02/05/2013    Patient Active Problem List   Diagnosis Date Noted  . ACS (acute coronary syndrome) ([name])   . AKI (acute kidney injury) ([name])   . Atrial fibrillation with RVR ([name]) 03/30/2020  . Contusion of right hand 11/12/2018  . Primary osteoarthritis of both first carpometacarpal joints 11/12/2018  . Pain in joint of right shoulder 10/05/2018  . Pain in right hand 10/05/2018  . Hematoma 03/30/2018  . Degeneration of lumbar intervertebral disc 12/21/2017  . Lumbar radiculopathy 12/21/2017  . Vertigo 04/09/2017  . Hypogonadism in male 12/07/2014  . Screening for prostate cancer 12/07/2014  . Claudication of right lower extremity ([name]) 10/20/2013  . Claudication in peripheral vascular disease- Rt leg 09/20/2013  . Obesity (BMI 30-39.9)- negative sleep study in the past 09/20/2013  . Occlusion and stenosis of carotid artery without mention of  cerebral infarction 05/23/2013  . Peripheral arterial disease ([name]) 05/18/2013  . Carotid artery disease ([name]) 04/14/2013  . Bradycardia, sinus 02/18/2013  . Hyperlipidemia 02/05/2013  . Tobacco abuse 02/05/2013  . Chest pain, unstable angina, negative MI 02/04/2013  . DM (diabetes mellitus) ([name]) 02/04/2013  . CAD hx of 2 stents to RCA in 2003,  80-90% stenosis of LAD/CFX June 2014- medical Rx 02/04/2013  . Benign essential hypertension 07/10/2011  . Incisional hernia 05/22/2011    Past Surgical History:  Procedure Laterality Date  .  ABDOMINAL AORTOGRAM W/LOWER EXTREMITY N/A 03/30/2018   Procedure: ABDOMINAL AORTOGRAM W/LOWER EXTREMITY;  Surgeon: Nada LibmanBrabham, Vance W, MD;  Location: [facility] INVASIVE CV LAB;  Service: Cardiovascular;  Laterality: N/A;  . ABDOMINAL AORTOGRAM W/LOWER EXTREMITY Bilateral 08/27/2020   Procedure: ABDOMINAL AORTOGRAM W/LOWER EXTREMITY;  Surgeon: Runell GessBerry, Jonathan J, MD;  Location: [facility] INVASIVE CV LAB;  Service: Cardiovascular;  Laterality: Bilateral;  . APPENDECTOMY    . CARDIAC CATHETERIZATION  08/29/2002   2-vessel CAD with high-grade stenoses in RCA; stenting to RCA  . CARDIAC CATHETERIZATION  2014   80-90% lesion will try medical therapy  . CARDIAC SURGERY     stents  2003  . CARDIOVERSION N/A 11/01/2013   Procedure: Dimple NanasPSUEDO ANUERYSM COMPRESSION;  Surgeon: Runell GessJonathan J Berry, MD;  [facility];  Service: Cardiovascular;  Laterality: N/A;  . CAROTID ANGIOGRAM N/A 04/25/2013   Procedure: CAROTID ANGIOGRAM;  Surgeon: Runell GessJonathan J Berry, MD;  [facility];  Service: Cardiovascular;  Laterality: N/A;  . CAROTID ENDARTERECTOMY    . COLON RESECTION  3/04, 6/04    1 bleeding diverticulitis, 2 complete colectomy  . COLON SURGERY  2005   renal pouch rectal anastimosis  . CORONARY ANGIOPLASTY WITH STENT PLACEMENT  09/2002   2 stents to RCA  . ENDARTERECTOMY Left 06/02/2013   Procedure: ENDARTERECTOMY CAROTID-LEFT;  Surgeon: Nada LibmanVance W Brabham, MD;  [facility];  Service: Vascular;  Laterality: Left;  . INTRAVASCULAR PRESSURE WIRE/FFR STUDY N/A 04/02/2020   Procedure: INTRAVASCULAR PRESSURE WIRE/FFR STUDY;  Surgeon: Runell GessBerry, Jonathan J, MD;  Location: [facility] LAB;  Service: Cardiovascular;  Laterality: N/A;  DFR - RCA  . LEFT HEART CATH AND CORONARY ANGIOGRAPHY N/A 04/02/2020   Procedure: LEFT HEART CATH AND CORONARY ANGIOGRAPHY;  Surgeon: Runell GessBerry, Jonathan J, MD;  Location: [facility];  Service: Cardiovascular;  Laterality: N/A;  . LEFT HEART CATHETERIZATION WITH CORONARY ANGIOGRAM N/A  02/08/2013   Procedure: LEFT HEART CATHETERIZATION WITH CORONARY ANGIOGRAM;  Surgeon: Runell GessJonathan J Berry, MD;  [facility];  Service: Cardiovascular;  Laterality: N/A;  . LOWER EXTREMITY ANGIOGRAM N/A 10/20/2013   Procedure: LOWER EXTREMITY ANGIOGRAM;  Surgeon: Runell GessJonathan J Berry, MD;  [facility];  Service: Cardiovascular;  Laterality: N/A;  . PATCH ANGIOPLASTY Left 06/02/2013   Procedure: PATCH ANGIOPLASTY;  Surgeon: Nada LibmanVance W Brabham, MD;  [facility];  Service: Vascular;  Laterality: Left;  . PERIPHERAL VASCULAR INTERVENTION Bilateral 03/30/2018   Procedure: PERIPHERAL VASCULAR INTERVENTION;  Surgeon: Nada LibmanBrabham, Vance W, MD;  Location: [facility];  Service: Cardiovascular;  Laterality: Bilateral;  common iliacs  . TONSILLECTOMY         Family History  Problem Relation Age of Onset  . Heart disease Father        heart attack at 8559  . Heart attack Father   . Hyperlipidemia Father   . Colonic polyp Mother        that bled out  .  COPD Mother   . Diabetes Mother   . Heart disease Sister   . Colonic polyp Sister   . Stroke Maternal Grandfather   . Heart attack Paternal Grandfather   . Other Neg Hx        hypogonadism    Social History   Tobacco Use  . Smoking status: Current Every Day Smoker    Packs/day: 0.75    Years: 57.00    Pack years: 42.75    Types: Cigarettes    Last attempt to quit: 04/06/2013    Years since quitting: 7.7  . Smokeless tobacco: Former Neurosurgeon    Quit date: 04/14/2013  . Tobacco comment: pt states that he is using the vapor cigs  Vaping Use  . Vaping Use: Never used  Substance Use Topics  . Alcohol use: Yes    Alcohol/week: 1.0 standard drink    Types: 1 Glasses of wine per week    Comment: "very little"  . Drug use: No    Home Medications Prior to Admission medications   Medication Sig Start Date End Date Taking? Authorizing Provider  amLODipine (NORVASC) 10 MG tablet Take 10 mg by mouth at bedtime.     [provider]  apixaban (ELIQUIS) 5 MG TABS tablet Take 1 tablet (5 mg total) by mouth 2 (two) times daily. 04/03/20   Marinda Elk, MD  atorvastatin (LIPITOR) 40 MG tablet Take 40 mg by mouth at bedtime.     [provider]  clopidogrel (PLAVIX) 75 MG tablet Take 1 tablet (75 mg total) by mouth daily. 04/03/20   Marinda Elk, MD  escitalopram (LEXAPRO) 20 MG tablet Take 20 mg by mouth daily. 02/14/18   [provider]  fluticasone (FLONASE) 50 MCG/ACT nasal spray Place 1 spray into both nostrils daily as needed for allergies or rhinitis.     [provider]  Fluticasone-Salmeterol (ADVAIR) 100-50 MCG/DOSE AEPB Inhale 1 puff into the lungs daily.    [provider]  glipiZIDE (GLUCOTROL) 5 MG tablet Take 5 mg by mouth daily at 12 noon.  02/25/17   [provider]  hydroxypropyl methylcellulose / hypromellose (ISOPTO TEARS / GONIOVISC) 2.5 % ophthalmic solution Place 1 drop into both eyes daily as needed for dry eyes.    [provider]  influenza vaccine adjuvanted (FLUAD QUADRIVALENT) 0.5 ML injection Fluad Quad 2020-2021(54yr up)(PF) 60 mcg (15 mcg x 4)/0.72mL IM syringe  PHARMACY ADMINISTERED    [provider]  isosorbide mononitrate (IMDUR) 60 MG 24 hr tablet Take 60 mg by mouth daily.    [provider]  losartan-hydrochlorothiazide (HYZAAR) 100-25 MG per tablet Take 1 tablet by mouth at bedtime.     [provider]  metoprolol tartrate (LOPRESSOR) 25 MG tablet Take 1 tablet (25 mg total) by mouth 2 (two) times daily. Patient taking differently: Take 25 mg by mouth daily. 04/03/20   Marinda Elk, MD  OZEMPIC, 1 MG/DOSE, 2 MG/1.5ML SOPN Inject 2.5 mg into the skin once a week.  03/27/20   [provider]  pantoprazole (PROTONIX) 40 MG tablet Take 40 mg by mouth daily.     [provider]    Allergies    Fentanyl, Gabapentin, Lisinopril, Lyrica [pregabalin], Metformin, and  Propofol  Review of Systems   Review of Systems  Constitutional: Positive for activity change and fatigue. Negative for appetite change, chills and fever.  HENT: Negative.   Respiratory: Positive for shortness of breath. Negative for cough, chest  tightness and wheezing.   Cardiovascular: Negative for chest pain, palpitations and leg swelling.  Gastrointestinal: Negative.   Genitourinary: Negative.   Musculoskeletal: Negative.   Neurological: Positive for weakness, light-headedness and headaches.  Hematological: Negative.     Physical Exam Updated Vital Signs BP (!) 198/78   Pulse 94   Temp 98.8 F (37.1 C)   Resp 18   SpO2 97%   Physical Exam Vitals and nursing note reviewed.  Constitutional:      General: He is awake.     Appearance: He is not toxic-appearing.  HENT:     Head: Normocephalic and atraumatic.     Nose: Nose normal.     Mouth/Throat:     Mouth: Mucous membranes are moist.     Pharynx: Oropharynx is clear. Uvula midline. No oropharyngeal exudate or posterior oropharyngeal erythema.     Tonsils: No tonsillar exudate.  Eyes:     General: Lids are normal. Vision grossly intact.        Right eye: No discharge.        Left eye: No discharge.     Extraocular Movements: Extraocular movements intact.     Conjunctiva/sclera: Conjunctivae normal.     Pupils: Pupils are equal, round, and reactive to light.     Visual Fields: Right eye visual fields normal and left eye visual fields normal.  Neck:     Trachea: Trachea and phonation normal.  Cardiovascular:     Rate and Rhythm: Tachycardia present. Rhythm regularly irregular.     Pulses: Normal pulses.     Heart sounds: Normal heart sounds. No murmur heard.     Comments: Hypertensive with SBP >200, DBP >100 Pulmonary:     Effort: Pulmonary effort is normal. Tachypnea present. No accessory muscle usage, prolonged expiration, respiratory distress or retractions.     Breath sounds: Examination of the right-lower  field reveals rales. Examination of the left-lower field reveals rales. Rales present. No wheezing.  Chest:     Chest wall: No mass, lacerations, deformity, swelling, tenderness, crepitus or edema.  Abdominal:     General: Bowel sounds are normal. There is no distension.     Palpations: Abdomen is soft.     Tenderness: There is no abdominal tenderness. There is no guarding or rebound.  Musculoskeletal:        General: No deformity.     Cervical back: Normal range of motion and neck supple. No rigidity or crepitus. No pain with movement, spinous process tenderness or muscular tenderness.     Right lower leg: No edema.     Left lower leg: No edema.     Comments: Symmetric strength and normal sensation in upper and lower extremities bilaterally.  Lymphadenopathy:     Cervical: No cervical adenopathy.  Skin:    General: Skin is warm and dry.     Capillary Refill: Capillary refill takes less than 2 seconds.  Neurological:     General: No focal deficit present.     Mental Status: He is alert and oriented to person, place, and time. Mental status is at baseline.     Cranial Nerves: Cranial nerves are intact.     Sensory: Sensation is intact.     Motor: Pronator drift present. No weakness, tremor or abnormal muscle tone.     Coordination: Romberg sign positive. Coordination normal. Finger-Nose-Finger Test and Heel to [facility] Test normal.     Gait: Gait is intact.  Psychiatric:        Mood  and Affect: Mood normal.     ED Results / Procedures / Treatments   Labs (all labs ordered are listed, but only abnormal results are displayed) Labs Reviewed  COMPREHENSIVE METABOLIC PANEL - Abnormal; Notable for the following components:      Result Value   Potassium 3.2 (*)    Glucose, Bld 228 (*)    Creatinine, Ser 1.49 (*)    Calcium 8.6 (*)    Total Protein 6.3 (*)    Albumin 3.2 (*)    GFR, Estimated 48 (*)    All other components within normal limits  URINALYSIS, ROUTINE W REFLEX MICROSCOPIC  - Abnormal; Notable for the following components:   Glucose, UA >=500 (*)    Hgb urine dipstick SMALL (*)    Protein, ur >=300 (*)    All other components within normal limits  BRAIN NATRIURETIC PEPTIDE - Abnormal; Notable for the following components:   B Natriuretic Peptide 1,024.5 (*)    All other components within normal limits  TROPONIN I (HIGH SENSITIVITY) - Abnormal; Notable for the following components:   Troponin I (High Sensitivity) 49 (*)    All other components within normal limits  RESP PANEL BY RT-PCR (FLU A&B, COVID) ARPGX2  URINE CULTURE  CBC WITH DIFFERENTIAL/PLATELET  PROTIME-INR  RAPID URINE DRUG SCREEN, HOSP PERFORMED  TROPONIN I (HIGH SENSITIVITY)    EKG EKG: Afib, no STEMI  Radiology  Procedures Procedures   Medications Ordered in ED Medications  nitroGLYCERIN 50 mg in dextrose 5 % 250 mL (0.2 mg/mL) infusion (has no administration in time range)  furosemide (LASIX) injection 40 mg (has no administration in time range)  aspirin chewable tablet 324 mg (has no administration in time range)  labetalol (NORMODYNE) injection 10 mg (10 mg Intravenous Given 01/01/21 1352)  labetalol (NORMODYNE) injection 10 mg (10 mg Intravenous Given 01/01/21 1453)    ED Course  I have reviewed the triage vital signs and the nursing notes.  Pertinent labs & imaging results that were available during my care of the patient were reviewed by me and considered in my medical decision making (see chart for details).  Clinical Course as of 01/01/21 1611  Tue Jan 01, 2021  1603 Spoke with Dr. Antoine Poche, cardiologist.  States they will be happy to consult on the patient. [SJ]    Clinical Course User Index [SJ] Joy, Shawn C, PA-C   MDM Rules/Calculators/A&P                         80 year old male presents with concern for disequilibrium and shortness of breath in the last for 5 days.  Presentation concerning for hypertensive emergency versus CVA; differential diagnosis also  includes Metabolic derangement, ACS, pneumonia pulmonary edema/pleural effusion, heart failure exacerbation  Tachycardic and severely hypertensive on intake as well as tachypneic.  Cardiac exam with irregularly irregular rhythm, rales in the lung bases on pulmonary exam.  Abdominal exam is benign.  Neurologic exam with positive Romberg and left-sided pronator drift, otherwise no focal deficit.  Patient with significant shortness of breath with ambulation.   IV labetalol administered for severe hypertension.  CBC unremarkable, CMP With baseline creatinine of 1.4.  Troponin elevated to 4.9, coag studies normal.  UA with proteinuria, glucose, and hemoglobin.   Chest x-ray, BNP, and CT of the head pending at time of shift change.  Care of this patient signed out to oncoming ED provider, Harolyn Rutherford, PA-C at time of shift change.  All  pertinent HPI, physical exam, and laboratory findings were discussed with him prior to my departure.  Patient will likely require admission to the hospital.  I appreciate his collaboration care of this patient.  This chart was dictated using voice recognition software, Dragon. Despite the best efforts of this provider to proofread and correct errors, errors may still occur which can change documentation meaning.  Final Clinical Impression(s) / ED Diagnoses Final diagnoses:  None    Rx / DC Orders ED Discharge Orders    None       Paris Lore, PA-C 01/01/21 1612    Arran Fessel, Eugene Gavia, PA-C 01/01/21 1617    [name], MD 01/02/21 1055

## 2021-01-01 NOTE — ED Provider Notes (Signed)
Albert Powell is a 80 y.o. male, presenting to the ED with shortness of breath worsened over the last week.  Also endorses unsteadiness on his feet as well as feeling overall unwell. He states he does have lower extremity edema intermittently, worse on the right, but this does not seem to have changed recently. Denies chest pain, fever, syncope, abdominal pain.  HPI from Albert Dicker, PA-C: "[name] is a 81 y.o. male presents with concern for sensation of disequilibrium and generalized body weakness that started on Saturday immediately following severe left-sided headache which she describes as intense pressure behind his eye.  He has not had recurrence of that headache since that time but has endorsed whole body weakness.  Additionally his wife endorses that he has been much more emotionally labile since that time.  Of note patient is post to be on Eliquis for anticoagulation for A. fib, however states he stopped taking that approximately 4 months ago but is not sure why.  His wife states she was unaware that he did so.  Additionally patient cannot longer remember the names of his medications or whether or not he has taken them, which his wife says is new for him as he usually manages his own medications.  He denies any blurry vision or double vision, denies pain in the eye itself, denies chest pain, or palpitations but does endorse new shortness of breath on exertion. According his wife he never experienced any facial droop, slurring of his speech, or inability to walk but has been a significantly more unsteady on his feet than she feels is normal for him. Wife feels that he has been more forgetful and confused for the last 4 weeks.  I have reviewed his medial records. Patient with hx of CAD, LVEF on echo in 03/2020 revealed LVEF of 55-60%. Patient has both eliquis and plavix listed in his chart but states he is not taking either."    Past Medical History:  Diagnosis Date  .  Atrial fibrillation ([name]) 03/30/2020  . Bradycardia, sinus 02/18/2013  . CAD (coronary artery disease)    stent to RCA 2003 also had 40% lesion at that time; NUCLEAR STRESS TEST, 01/16/2010 - normal  now with cath 80-90% stenosis in LAD, will try medical therapy if no improvement PCI  . Carotid artery disease ([name])   . Cataract   . Claudication ([name])    LEA DUPLEX, 07/07/2008 - Normal  . COPD (chronic obstructive pulmonary disease) ([name])   . DDD (degenerative disc disease) 2008  . Diabetes mellitus   . GERD (gastroesophageal reflux disease)   . H/O hiatal hernia   . History of colonoscopy 2004   finding of tics and AMV only no polpys  . Hyperlipidemia 02/05/2013  . Hypertension   . Onychomycosis   . Rosacea   . Tobacco abuse 02/05/2013   Physical Exam  BP (!) 203/130   Pulse 97   Temp 98.6 F (37 C) (Oral)   Resp (!) 24   SpO2 92%   Physical Exam Vitals and nursing note reviewed.  Constitutional:      General: He is not in acute distress.    Appearance: He is well-developed. He is obese. He is not diaphoretic.  HENT:     Head: Normocephalic and atraumatic.     Mouth/Throat:     Mouth: Mucous membranes are moist.     Pharynx: Oropharynx is clear.  Eyes:     Conjunctiva/sclera: Conjunctivae normal.  Cardiovascular:  Rate and Rhythm: Normal rate and regular rhythm.     Pulses: Normal pulses.          Radial pulses are 2+ on the right side and 2+ on the left side.       Posterior tibial pulses are 2+ on the right side and 2+ on the left side.     Heart sounds: Normal heart sounds.     Comments: Tactile temperature in the extremities appropriate and equal bilaterally. Pulmonary:     Breath sounds: Rales present.     Comments: Increased work of breathing, conversational dyspnea following changing positions and mild exertion. Abdominal:     Palpations: Abdomen is soft.     Tenderness: There is no abdominal tenderness. There is no guarding.  Musculoskeletal:     Cervical  back: Neck supple.     Right lower leg: 1+ Pitting Edema present.     Left lower leg: No edema.  Skin:    General: Skin is warm and dry.  Neurological:     Mental Status: He is alert.  Psychiatric:        Mood and Affect: Mood and affect normal.        Speech: Speech normal.        Behavior: Behavior normal.     ED Course/Procedures     .Critical Care Performed by: Anselm Pancoast, PA-C Authorized by: Anselm Pancoast, PA-C   Critical care provider statement:    Critical care time (minutes):  35   Critical care time was exclusive of:  Separately billable procedures and treating other patients   Critical care was necessary to treat or prevent imminent or life-threatening deterioration of the following conditions:  Cardiac failure   Critical care was time spent personally by me on the following activities:  Ordering and performing treatments and interventions, ordering and review of laboratory studies, ordering and review of radiographic studies, evaluation of patient's response to treatment, development of treatment plan with patient or surrogate, discussions with consultants, obtaining history from patient or surrogate, examination of patient, review of old charts, re-evaluation of patient's condition and pulse oximetry   I assumed direction of critical care for this patient from another provider in my specialty: yes     Care discussed with: admitting provider       Abnormal Labs Reviewed  COMPREHENSIVE METABOLIC PANEL - Abnormal; Notable for the following components:      Result Value   Potassium 3.2 (*)    Glucose, Bld 228 (*)    Creatinine, Ser 1.49 (*)    Calcium 8.6 (*)    Total Protein 6.3 (*)    Albumin 3.2 (*)    GFR, Estimated 48 (*)    All other components within normal limits  URINALYSIS, ROUTINE W REFLEX MICROSCOPIC - Abnormal; Notable for the following components:   Glucose, UA >=500 (*)    Hgb urine dipstick SMALL (*)    Protein, ur >=300 (*)    All other  components within normal limits  BRAIN NATRIURETIC PEPTIDE - Abnormal; Notable for the following components:   B Natriuretic Peptide 1,024.5 (*)    All other components within normal limits  TROPONIN I (HIGH SENSITIVITY) - Abnormal; Notable for the following components:   Troponin I (High Sensitivity) 49 (*)    All other components within normal limits  TROPONIN I (HIGH SENSITIVITY) - Abnormal; Notable for the following components:   Troponin I (High Sensitivity) 61 (*)    All other components  within normal limits    CT Head Wo Contrast  Result Date: 01/01/2021 CLINICAL DATA:  Disequilibrium. Severe headache 3 days ago almost resulting in a fall with subsequent weakness. EXAM: CT HEAD WITHOUT CONTRAST TECHNIQUE: Contiguous axial images were obtained from the base of the skull through the vertex without intravenous contrast. COMPARISON:  03/30/2020 FINDINGS: Brain: There is no evidence of an acute infarct, intracranial hemorrhage, mass, midline shift, or extra-axial fluid collection. Patchy to confluent hypodensities in the cerebral white matter bilaterally have slightly progressed and are nonspecific but compatible with moderate chronic small vessel ischemic disease. There is mild-to-moderate cerebral atrophy. A chronic lacunar infarct in the right caudate nucleus is unchanged. Vascular: Calcified atherosclerosis at the skull base. No hyperdense vessel. Skull: No fracture or suspicious osseous lesion. Sinuses/Orbits: Paranasal sinuses and mastoid air cells are clear. Bilateral cataract extraction. Other: None. IMPRESSION: 1. No evidence of acute intracranial abnormality. 2. Moderate chronic small vessel ischemic disease. Electronically Signed   By: Sebastian Ache M.D.   On: 01/01/2021 15:13   DG Chest Portable 1 View  Result Date: 01/01/2021 CLINICAL DATA:  Short of breath and dizziness EXAM: PORTABLE CHEST 1 VIEW COMPARISON:  03/30/2020 FINDINGS: Cardiac enlargement with mild vascular congestion.  Perihilar and bibasilar airspace disease has developed since the prior study. No effusion. Atherosclerotic calcification aortic arch. Spinal cord stimulator midthoracic spine unchanged. IMPRESSION: Cardiac enlargement with perihilar and bilateral airspace disease suggestive of interstitial edema. Pneumonia also in the differential. Electronically Signed   By: Marlan Palau M.D.   On: 01/01/2021 14:25     EKG Interpretation  Date/Time:  Tuesday January 01 2021 12:21:26 EDT Ventricular Rate:  123 PR Interval:    QRS Duration: 74 QT Interval:  330 QTC Calculation: 472 R Axis:   151 Text Interpretation: Atrial fibrillation with rapid ventricular response with premature ventricular or aberrantly conducted complexes Right axis deviation Abnormal QRS-T angle, consider primary T wave abnormality Abnormal ECG a fib new since last tracing Confirmed by Linwood Dibbles [phone]) on 01/01/2021 5:57:44 PM       MDM    Clinical Course as of 01/01/21 2012  Tue Jan 01, 2021  1603 Spoke with Dr. Antoine Poche, cardiologist.  States they will be happy to consult on the patient. [SJ]  1616 Spoke with Dr. Iver Nestle, neurologist.  Can get MRI brain. If negative and symptoms resolve after improvement in blood pressure, no further work-up is necessary from the part of neurology. If symptoms have not resolved after controlling the patient's blood pressure, reconsult neurology in the morning and they will see the patient. [SJ]  1803 RN asks whether patient may be removed from the nitroglycerin infusion in order to facilitate MRI.  I reevaluated the patient.  It seems as though he was trying to reposition himself in the bed which made him tachypneic and tachycardic.  Conversationally dyspneic.  He did not seem to have worsening lung sounds or air movement on reexamination.  He improved with 2 L supplemental O2 via nasal cannula as well as rest without movement.  We will continue to observe the patient and reevaluate for MRI later in  his course. [SJ]  1911 Spoke with Dr. Allena Katz, hospitalist.  Agrees to admit the patient. [SJ]    Clinical Course User Index [SJ] Amal Saiki C, PA-C    Patient presents with shortness of breath as well as unsteadiness on his feet. He tells me he stopped taking all of his medications except for his pantoprazole 3 to 4 months  ago.  He states he does not know exactly why he did this.  Patient has some dyspnea and increased work of breathing with mild exertion.  Rales on exam.  Edema on chest x-ray.  Elevated BNP. His blood pressure is quite high.  Therefore, it is not surprising he has a mildly elevated troponin. I suspect hypertensive emergency causing onset of heart failure.   Vitals:   01/01/21 1925 01/01/21 1930 01/01/21 1935 01/01/21 1940  BP: (!) 176/91 (!) 183/96 (!) 171/96 (!) 175/100  Pulse: 80 91 87 83  Resp: 20 (!) 26 (!) 29 19  Temp:      TempSrc:      SpO2: 91% 96% 98% 94%              Concepcion Living 01/01/21 2014    Linwood Dibbles, MD 01/03/21 [phone]

## 2021-01-01 NOTE — ED Notes (Signed)
Pt in CT.

## 2021-01-02 DIAGNOSIS — I161 Hypertensive emergency: Principal | ICD-10-CM

## 2021-01-02 LAB — CBC
HCT: 42.4 % (ref 39.0–52.0)
Hemoglobin: 14.1 g/dL (ref 13.0–17.0)
MCH: 28.9 pg (ref 26.0–34.0)
MCHC: 33.3 g/dL (ref 30.0–36.0)
MCV: 86.9 fL (ref 80.0–100.0)
Platelets: 255 10*3/uL (ref 150–400)
RBC: 4.88 MIL/uL (ref 4.22–5.81)
RDW: 13.8 % (ref 11.5–15.5)
WBC: 10.1 10*3/uL (ref 4.0–10.5)
nRBC: 0 % (ref 0.0–0.2)

## 2021-01-02 LAB — HEMOGLOBIN A1C
Hgb A1c MFr Bld: 7.5 % — ABNORMAL HIGH (ref 4.8–5.6)
Mean Plasma Glucose: 168.55 mg/dL

## 2021-01-02 LAB — BASIC METABOLIC PANEL
Anion gap: 12 (ref 5–15)
BUN: 14 mg/dL (ref 8–23)
CO2: 25 mmol/L (ref 22–32)
Calcium: 8.8 mg/dL — ABNORMAL LOW (ref 8.9–10.3)
Chloride: 100 mmol/L (ref 98–111)
Creatinine, Ser: 1.62 mg/dL — ABNORMAL HIGH (ref 0.61–1.24)
GFR, Estimated: 43 mL/min — ABNORMAL LOW (ref >60)
Glucose, Bld: 194 mg/dL — ABNORMAL HIGH (ref 70–99)
Potassium: 4.3 mmol/L (ref 3.5–5.1)
Sodium: 137 mmol/L (ref 135–145)

## 2021-01-02 LAB — MAGNESIUM
Magnesium: 1.9 mg/dL (ref 1.7–2.4)
Magnesium: 2 mg/dL (ref 1.7–2.4)

## 2021-01-02 LAB — URINE CULTURE: Culture: NO GROWTH

## 2021-01-02 LAB — GLUCOSE, CAPILLARY
Glucose-Capillary: 142 mg/dL — ABNORMAL HIGH (ref 70–99)
Glucose-Capillary: 150 mg/dL — ABNORMAL HIGH (ref 70–99)
Glucose-Capillary: 166 mg/dL — ABNORMAL HIGH (ref 70–99)
Glucose-Capillary: 196 mg/dL — ABNORMAL HIGH (ref 70–99)
Glucose-Capillary: 95 mg/dL (ref 70–99)

## 2021-01-02 NOTE — Evaluation (Signed)
Physical Therapy Evaluation Patient Details Name: Albert Powell MRN: [id number] DOB: [birth year] Today's Date: 01/02/2021   History of Present Illness  80 y.o. male presented to the ED 01/01/21 for evaluation of gait instability x 1 week. bP in ED 203/120 CT head no acute changes; MRI unable to be done due to non-compatible device in pt's back.  PMH- significant for chronic atrial fibrillation (previously on Eliquis, has not been taking recently), CAD s/p PCI with stenting, CAS s/p left CEA, CKD stage III, T2DM, HTN, HLD, PVD  Clinical Impression   Pt admitted secondary to problem above with deficits below. PTA patient was modified independent with occasional use of single point cane vs no device. He walked limited community distances due to dyspnea. Pt currently requires minguard assist with cane with pt reporting his balance is not yet back to baseline and agreeable to HHPT. Will continue to follow acutely to maximize functional mobility independence and safety.       Follow Up Recommendations Home health PT    Equipment Recommendations  None recommended by PT    Recommendations for Other Services OT consult     Precautions / Restrictions Precautions Precautions: Fall      Mobility  Bed Mobility               General bed mobility comments: sitting EOB on arrival; in bathroom at end of session    Transfers Overall transfer level: Needs assistance Equipment used: Straight cane Transfers: Sit to/from Stand Sit to Stand: Supervision         General transfer comment: supervision for safety with lines and assure stability; from EOB and toilet  Ambulation/Gait Ambulation/Gait assistance: Min guard Gait Distance (Feet): 230 Feet (seated rest; 80 ft; 15 x 2 (to/frombathroom)) Assistive device: Straight cane Gait Pattern/deviations: Step-through pattern;Decreased stride length;Wide base of support Gait velocity: appropriate for his respiratory status Gait velocity  interpretation: 1.31 - 2.62 ft/sec, indicative of limited community ambulator General Gait Details: incr lateral excursion of shoulders/upper trunk to each side  Stairs            Wheelchair Mobility    Modified Rankin (Stroke Patients Only) Modified Rankin (Stroke Patients Only) Pre-Morbid Rankin Score: Slight disability Modified Rankin: Moderately severe disability     Balance Overall balance assessment: Needs assistance   Sitting balance-Leahy Scale: Good           Single Leg Stance - Right Leg: 1 Single Leg Stance - Left Leg: 1     Rhomberg - Eyes Opened: 30 (slight incr sway) Rhomberg - Eyes Closed: 10 (more than normal sway but no LOB)                 Pertinent Vitals/Pain Pain Assessment: No/denies pain    Home Living Family/patient expects to be discharged to:: Private residence Living Arrangements: Spouse/significant other Available Help at Discharge: Family;Available 24 hours/day Type of Home: Apartment Home Access: Level entry     Home Layout: One level Home Equipment: Cane - single point;Walker - 4 wheels      Prior Function Level of Independence: Independent with assistive device(s)         Comments: uses cane outside; no device inside     Hand Dominance   Dominant Hand: Right    Extremity/Trunk Assessment   Upper Extremity Assessment Upper Extremity Assessment: Defer to OT evaluation    Lower Extremity Assessment Lower Extremity Assessment: RLE deficits/detail RLE Deficits / Details: decr heel to shin x 3 reps  compared to LLE RLE Sensation:  (reports his gets calf numbness when walks longer distance) RLE Coordination: decreased gross motor;decreased fine motor (heel to shin, toe taps asynchronous)    Cervical / Trunk Assessment Cervical / Trunk Assessment: Other exceptions Cervical / Trunk Exceptions: overweight  Communication   Communication: No difficulties  Cognition Arousal/Alertness: Awake/alert Behavior  During Therapy: WFL for tasks assessed/performed Overall Cognitive Status: Within Functional Limits for tasks assessed                                 General Comments: a&ox 4; follows commands briskly; no errors in sequencing      General Comments General comments (skin integrity, edema, etc.): BP sitting188/94  standing 136/71  after walking 124/82    Exercises     Assessment/Plan    PT Assessment Patient needs continued PT services  PT Problem List Decreased activity tolerance;Decreased balance;Decreased mobility;Decreased coordination;Cardiopulmonary status limiting activity;Obesity       PT Treatment Interventions DME instruction;Gait training;Functional mobility training;Therapeutic activities;Therapeutic exercise;Balance training;Patient/family education    PT Goals (Current goals can be found in the Care Plan section)  Acute Rehab PT Goals Patient Stated Goal: go home tomorrow PT Goal Formulation: With patient Time For Goal Achievement: 01/16/21 Potential to Achieve Goals: Good    Frequency Min 3X/week   Barriers to discharge        Co-evaluation               AM-PAC PT "6 Clicks" Mobility  Outcome Measure Help needed turning from your back to your side while in a flat bed without using bedrails?: None Help needed moving from lying on your back to sitting on the side of a flat bed without using bedrails?: None Help needed moving to and from a bed to a chair (including a wheelchair)?: A Little Help needed standing up from a chair using your arms (e.g., wheelchair or bedside chair)?: A Little Help needed to walk in hospital room?: A Little Help needed climbing 3-5 steps with a railing? : A Little 6 Click Score: 20    End of Session Equipment Utilized During Treatment: Gait belt Activity Tolerance: Patient limited by fatigue Patient left: in bed;with call bell/phone within reach;with bed alarm set Nurse Communication: Mobility status PT  Visit Diagnosis: Unsteadiness on feet (R26.81)    Time: 1950-9326 PT Time Calculation (min) (ACUTE ONLY): 44 min   Charges:   PT Evaluation $PT Eval Moderate Complexity: 1 Mod PT Treatments $Gait Training: 23-37 mins         Jerolyn Center, PT Pager [phone]   Zena Amos 01/02/2021, 9:48 AM

## 2021-01-02 NOTE — Evaluation (Signed)
Occupational Therapy Evaluation Patient Details Name: Albert Powell MRN: [id number] DOB: [birth year] Today's Date: 01/02/2021    History of Present Illness 80 y.o. male presented to the ED 01/01/21 for evaluation of gait instability x 1 week. bP in ED 203/120 CT head no acute changes; MRI unable to be done due to non-compatible device in pt's back.  PMH- significant for chronic atrial fibrillation (previously on Eliquis, has not been taking recently), CAD s/p PCI with stenting, CAS s/p left CEA, CKD stage III, T2DM, HTN, HLD, PVD   Clinical Impression   Patient evaluated by Occupational Therapy with no further acute OT needs identified. All education has been completed and the patient has no further questions. Pt is able to complete ADLs with min guard assist.  Reviewed safety with ADLs, use of DME, and energy conservation strategies.  He is eager for discharge.  Recommend he ambulate with nsg staff and complete ADLs to increased endurance.  See below for any follow-up Occupational Therapy or equipment needs. OT is signing off. Thank you for this referral.      Follow Up Recommendations  No OT follow up;Supervision/Assistance - 24 hour    Equipment Recommendations  None recommended by OT    Recommendations for Other Services       Precautions / Restrictions Precautions Precautions: Fall      Mobility Bed Mobility Overal bed mobility: Modified Independent                  Transfers Overall transfer level: Needs assistance Equipment used: Straight cane;Rolling walker (2 wheeled) Transfers: Sit to/from [employer] Sit to Stand: Supervision Stand pivot transfers: Min guard       General transfer comment: min guard/supervision for safety    Balance                                           ADL either performed or assessed with clinical judgement   ADL Overall ADL's : Needs assistance/impaired Eating/Feeding: Independent    Grooming: Wash/dry hands;Wash/dry face;Oral care;Brushing hair;Min guard;Standing   Upper Body Bathing: Supervision/ safety;Set up;Sitting   Lower Body Bathing: Min guard;Sit to/from stand   Upper Body Dressing : Set up;Supervision/safety;Sitting   Lower Body Dressing: Min guard;Sit to/from stand   Toilet Transfer: Min guard;Ambulation;Grab bars;RW;Comfort height toilet   Toileting- Clothing Manipulation and Hygiene: Min guard;Sit to/from stand       Functional mobility during ADLs: Min guard;Rolling walker General ADL Comments: requires assist for balance and safety     Vision         Perception     Praxis      Pertinent Vitals/Pain Pain Assessment: No/denies pain     Hand Dominance Right   Extremity/Trunk Assessment Upper Extremity Assessment Upper Extremity Assessment: Generalized weakness   Lower Extremity Assessment Lower Extremity Assessment: Defer to PT evaluation   Cervical / Trunk Assessment Cervical / Trunk Assessment: Other exceptions   Communication Communication Communication: No difficulties   Cognition Arousal/Alertness: Awake/alert Behavior During Therapy: WFL for tasks assessed/performed Overall Cognitive Status: Within Functional Limits for tasks assessed                                     General Comments  Pt is eager to discharge home.  He fatigued while using SCP  and demonstrated increased instability.  Recommended to him that he use a RW and he agreed.  Reviewed energy conservation strategies with him, and recommended that he utilize shower chair when showering.  He verbalized understanding    Exercises     Shoulder Instructions      Home Living Family/patient expects to be discharged to:: Private residence Living Arrangements: Spouse/significant other Available Help at Discharge: Family;Available 24 hours/day Type of Home: Apartment Home Access: Level entry     Home Layout: One level     Bathroom  Shower/Tub: Tub/shower unit;Walk-in shower   Bathroom Toilet: Standard     Home Equipment: Cane - single point;Walker - 4 wheels;Shower seat          Prior Functioning/Environment Level of Independence: Independent with assistive device(s)        Comments: uses cane outside; no device inside        OT Problem List: Decreased strength;Decreased activity tolerance;Impaired balance (sitting and/or standing);Decreased safety awareness;Obesity      OT Treatment/Interventions:      OT Goals(Current goals can be found in the care plan section) Acute Rehab OT Goals Patient Stated Goal: "to go home as soon as possible OT Goal Formulation: With patient  OT Frequency:     Barriers to D/C:            Co-evaluation              AM-PAC OT "6 Clicks" Daily Activity     Outcome Measure Help from another person eating meals?: None Help from another person taking care of personal grooming?: A Little Help from another person toileting, which includes using toliet, bedpan, or urinal?: A Little Help from another person bathing (including washing, rinsing, drying)?: A Little Help from another person to put on and taking off regular upper body clothing?: A Little Help from another person to put on and taking off regular lower body clothing?: A Little 6 Click Score: 19   End of Session Equipment Utilized During Treatment: Rolling walker Nurse Communication: Mobility status  Activity Tolerance: Patient limited by fatigue Patient left: in bed;with call bell/phone within reach  OT Visit Diagnosis: Unsteadiness on feet (R26.81)                Time: 3664-4034 OT Time Calculation (min): 18 min Charges:  OT Evaluation $OT Eval Moderate Complexity: 1 Mod  Yusuf Yu C., OTR/L Acute Rehabilitation Services Pager [phone] Office [phone]   Jeani Hawking M 01/02/2021, 5:19 PM

## 2021-01-02 NOTE — Progress Notes (Signed)
Progress Note  Patient Name: Albert Powell Date of Encounter: 01/02/2021  Primary Cardiologist:   Nanetta Batty, MD   Subjective   No pain.  Breathing OK.  Ambulated.   Inpatient Medications    Scheduled Meds: . amLODipine  5 mg Oral Daily  . apixaban  5 mg Oral BID  . atorvastatin  40 mg Oral QHS  . clopidogrel  75 mg Oral Daily  . insulin aspart  0-5 Units Subcutaneous QHS  . insulin aspart  0-9 Units Subcutaneous TID WC  . isosorbide mononitrate  60 mg Oral Daily  . losartan  100 mg Oral Daily  . metoprolol tartrate  25 mg Oral BID  . sodium chloride flush  3 mL Intravenous Q12H   Continuous Infusions: . nitroGLYCERIN 45 mcg/min (01/01/21 2213)   PRN Meds: acetaminophen **OR** acetaminophen, ondansetron **OR** ondansetron (ZOFRAN) IV   Vital Signs    Vitals:   01/01/21 2200 01/01/21 2213 01/01/21 2337 01/02/21 0744  BP: (!) 169/78   (!) 140/98  Pulse: 87   76  Resp: (!) 21   15  Temp:  97.7 F (36.5 C) 98.2 F (36.8 C)   TempSrc:  Oral Oral   SpO2: 97%   95%   No intake or output data in the 24 hours ending 01/02/21 0902 There were no vitals filed for this visit.  Telemetry    NSR, PACs - Personally Reviewed  ECG    N - Personally Reviewed  Physical Exam   GEN: No acute distress.   Neck: No  JVD Cardiac: RRR, no murmurs, rubs, or gallops.  Respiratory: Clear  to auscultation bilaterally. GI: Soft, nontender, non-distended  MS: No  edema; No deformity. Neuro:  Nonfocal  Psych: Normal affect   Labs    Chemistry Recent Labs  Lab 01/01/21 1314 01/02/21 0011  NA 135 137  K 3.2* 4.3  CL 99 100  CO2 24 25  GLUCOSE 228* 194*  BUN 11 14  CREATININE 1.49* 1.62*  CALCIUM 8.6* 8.8*  PROT 6.3*  --   ALBUMIN 3.2*  --   AST 19  --   ALT 26  --   ALKPHOS 76  --   BILITOT 1.1  --   GFRNONAA 48* 43*  ANIONGAP 12 12     Hematology Recent Labs  Lab 01/01/21 1314 01/02/21 0011  WBC 9.2 10.1  RBC 4.96 4.88  HGB 14.4 14.1  HCT  43.0 42.4  MCV 86.7 86.9  MCH 29.0 28.9  MCHC 33.5 33.3  RDW 13.6 13.8  PLT 247 255    Cardiac EnzymesNo results for input(s): TROPONINI in the last 168 hours. No results for input(s): TROPIPOC in the last 168 hours.   BNP Recent Labs  Lab 01/01/21 1350  BNP 1,024.5*     DDimer No results for input(s): DDIMER in the last 168 hours.   Radiology    CT Head Wo Contrast  Result Date: 01/01/2021 CLINICAL DATA:  Disequilibrium. Severe headache 3 days ago almost resulting in a fall with subsequent weakness. EXAM: CT HEAD WITHOUT CONTRAST TECHNIQUE: Contiguous axial images were obtained from the base of the skull through the vertex without intravenous contrast. COMPARISON:  03/30/2020 FINDINGS: Brain: There is no evidence of an acute infarct, intracranial hemorrhage, mass, midline shift, or extra-axial fluid collection. Patchy to confluent hypodensities in the cerebral white matter bilaterally have slightly progressed and are nonspecific but compatible with moderate chronic small vessel ischemic disease. There is mild-to-moderate cerebral atrophy. A  chronic lacunar infarct in the right caudate nucleus is unchanged. Vascular: Calcified atherosclerosis at the skull base. No hyperdense vessel. Skull: No fracture or suspicious osseous lesion. Sinuses/Orbits: Paranasal sinuses and mastoid air cells are clear. Bilateral cataract extraction. Other: None. IMPRESSION: 1. No evidence of acute intracranial abnormality. 2. Moderate chronic small vessel ischemic disease. Electronically Signed   By: Sebastian Ache M.D.   On: 01/01/2021 15:13   DG Chest Portable 1 View  Result Date: 01/01/2021 CLINICAL DATA:  Short of breath and dizziness EXAM: PORTABLE CHEST 1 VIEW COMPARISON:  03/30/2020 FINDINGS: Cardiac enlargement with mild vascular congestion. Perihilar and bibasilar airspace disease has developed since the prior study. No effusion. Atherosclerotic calcification aortic arch. Spinal cord stimulator  midthoracic spine unchanged. IMPRESSION: Cardiac enlargement with perihilar and bilateral airspace disease suggestive of interstitial edema. Pneumonia also in the differential. Electronically Signed   By: Marlan Palau M.D.   On: 01/01/2021 14:25    Cardiac Studies   ECHO:  1. Left ventricular ejection fraction, by estimation, is 55 to 60%. The  left ventricle has normal function. The left ventricle has no regional  wall motion abnormalities. Left ventricular diastolic parameters are  consistent with Grade I diastolic  dysfunction (impaired relaxation).  2. Right ventricular systolic function is normal. The right ventricular  size is normal.  3. The mitral valve is normal in structure. No evidence of mitral valve  regurgitation. No evidence of mitral stenosis.  4. The aortic valve is tricuspid. Aortic valve regurgitation is not  visualized. Mild aortic valve sclerosis is present, with no evidence of  aortic valve stenosis.  5. The inferior vena cava is normal in size with greater than 50%  respiratory variability, suggesting right atrial pressure of 3 mmHg.    Patient Profile     80 y.o. male with progressive worsening weakness, "wobbly feeling" and dyspnea on exertion.  He was admitted with acute on chronic diastolic HF and hypertensive urgency.    Assessment & Plan    ATRIAL FIB:  Back on DOAC.  Rhythm appears to be sinus with multifocal PACs on tele.  Has intermittent atrial fib.  No need to change therapy.  Resumed previous prescribed therapies.    CAD:  No acute coronary syndrome.  No change in therapy.    ACUTE ON CHRONIC DIASTOLIC HEART FAILURE:   Seems to be euvolemic.  I would suggest going home with 20 mg PRN Lasix as he was not on this prior.  I would suggest resume all previous meds at discharge otherwise.        HYPERTENSIVE URGENCY:  BP is improved.  On 5 of Norvasc and could be restarted on 10 mg at discharge as this was his which was his previous dose.  I will  stop the IV NTG in anticipation of him getting his morning scheduled meds.      For questions or updates, please contact [facility] Please consult [email or web address] for contact info under Cardiology/STEMI.   Signed, Rollene Rotunda, MD  01/02/2021, 9:02 AM

## 2021-01-02 NOTE — Progress Notes (Signed)
Pt. BP said 71/38, BP cuff was not on correctly. Rechecked BP and it is 118/49 sugar is also 95. Messaged doctor about follow up BP.

## 2021-01-02 NOTE — Progress Notes (Signed)
Rec'd call from ER nurse stating that pt was supposed to have a MRI done, however, because of the implanted device in his back, they unable to do the scan. Radiology did state that they can do CT scan if provider wants that done.

## 2021-01-02 NOTE — Progress Notes (Signed)
PROGRESS NOTE    Albert Powell  YQI:[id number] DOB: 1941/04/28 DOA: 01/01/2021 PCP: [name], MD   Brief Narrative:  Albert Powell is a 80 y.o. male with medical history significant for chronic atrial fibrillation (previously on Eliquis, has not been taking recently), CAD s/p PCI with stenting, CAS s/p left CEA, CKD stage III, T2DM, HTN, HLD, PVD who presents to the ED for evaluation of gait instability. Patient states that 1 week ago he had new severe left-sided headache.  Since then he has felt as if his gait has been unstable and off balance.  He has not fallen or lost consciousness.  He has had some lightheadedness but denies any room spinning sensation.  Today he had new onset of severe shortness of breath.  He has had cough associated with white sputum production.  He has not had any chest pain, subjective fevers, chills, diaphoresis, abdominal pain, dysuria.  He has had some nausea without emesis. At time of admitting evaluation, patient states that his breathing his much better after initial treatment in the ED - hospitalist consulted for admission; cardiology consulted.  Assessment & Plan:   Principal Problem:   Hypertensive emergency Active Problems:   Type 2 diabetes mellitus ([name])   CAD hx of 2 stents to RCA in 2003,  80-90% stenosis of LAD/CFX June 2014- medical Rx   Hyperlipidemia   Benign essential hypertension   Atrial fibrillation with RVR ([name])   Hypertensive emergency with pulmonary edema: - In setting of profound medication nonadherence.   - Continues on nitro drip, home medications resumed, cardiology assisting with titration of medications and drips. - Diuresis ongoing - Continue amlodipine 5 mg daily, Imdur 60 mg daily, losartan 100 mg daily, Lopressor 25 mg twice daily - Monitor strict I/O's and daily weights - Redose Lasix as needed  Chronic atrial fibrillation with acute RVR due to noncompliance: - Rates improving after initial management,  now maintaining in the 90s.   - Continue lopressor 25 mg twice daily and Eliquis 5 mg twice daily.  Ataxia/gait instability: - Likely 2/2 hypertensive emergency.  CT brain without acute changes.   - MRI brain pending  CAD s/p PCI with stenting: - Clinically stable.  Denies any chest pain.  Troponin elevated likely due to demand ischemia.  Continue Lopressor, Imdur, atorvastatin. - Cards following -appreciate insight and recommendations  Type 2 diabetes: - Continue SSI, check A1c.  PVD: - Continue Plavix, atorvastatin.  CKD stage III: - Stable, continue to monitor.  DVT prophylaxis: Eliquis Code Status: DNR Family Communication: None present  Status is: Inpatient  Dispo: The patient is from: Home              Anticipated d/c is to: Home              Anticipated d/c date is: 48 to 72 hours pending clinical course              Patient currently not medically stable for discharge given ongoing need for IV medications to control hypertensive emergency  Consultants:   Cardiology  Procedures:   None  Antimicrobials:  None indicated  Subjective: No acute issues or events overnight denies chest pain shortness of breath nausea vomiting diarrhea constipation any fevers or chills.  Objective: Vitals:   01/01/21 2145 01/01/21 2200 01/01/21 2213 01/01/21 2337  BP: (!) 176/87 (!) 169/78    Pulse: (!) 120 87    Resp: (!) 25 (!) 21    Temp:   97.7  F (36.5 C) 98.2 F (36.8 C)  TempSrc:   Oral Oral  SpO2: 96% 97%     No intake or output data in the 24 hours ending 01/02/21 0722 There were no vitals filed for this visit.  Examination:  General:  Pleasantly resting in bed, No acute distress. HEENT:  Normocephalic atraumatic.  Sclerae nonicteric, noninjected.  Extraocular movements intact bilaterally. Neck:  Without mass or deformity.  Trachea is midline. Lungs:  Clear to auscultate bilaterally without rhonchi, wheeze, or rales. Heart:  Regular rate and rhythm.   Without murmurs, rubs, or gallops. Abdomen:  Soft, nontender, nondistended.  Without guarding or rebound. Extremities: Without cyanosis, clubbing, edema, or obvious deformity. Vascular:  Dorsalis pedis and posterior tibial pulses palpable bilaterally. Skin:  Warm and dry, no erythema, no ulcerations.   Data Reviewed: I have personally reviewed following labs and imaging studies  CBC: Recent Labs  Lab 01/01/21 1314 01/02/21 0011  WBC 9.2 10.1  NEUTROABS 7.2  --   HGB 14.4 14.1  HCT 43.0 42.4  MCV 86.7 86.9  PLT 247 255   Basic Metabolic Panel: Recent Labs  Lab 01/01/21 1314 01/02/21 0011  NA 135 137  K 3.2* 4.3  CL 99 100  CO2 24 25  GLUCOSE 228* 194*  BUN 11 14  CREATININE 1.49* 1.62*  CALCIUM 8.6* 8.8*  MG  --  1.9  2.0   GFR: CrCl cannot be calculated (Unknown ideal weight.). Liver Function Tests: Recent Labs  Lab 01/01/21 1314  AST 19  ALT 26  ALKPHOS 76  BILITOT 1.1  PROT 6.3*  ALBUMIN 3.2*   No results for input(s): LIPASE, AMYLASE in the last 168 hours. No results for input(s): AMMONIA in the last 168 hours. Coagulation Profile: Recent Labs  Lab 01/01/21 1314  INR 1.0   Cardiac Enzymes: No results for input(s): CKTOTAL, CKMB, CKMBINDEX, TROPONINI in the last 168 hours. BNP (last 3 results) No results for input(s): PROBNP in the last 8760 hours. HbA1C: Recent Labs    01/02/21 0011  HGBA1C 7.5*   CBG: Recent Labs  Lab 01/02/21 0006  GLUCAP 196*   Lipid Profile: No results for input(s): CHOL, HDL, LDLCALC, TRIG, CHOLHDL, LDLDIRECT in the last 72 hours. Thyroid Function Tests: No results for input(s): TSH, T4TOTAL, FREET4, T3FREE, THYROIDAB in the last 72 hours. Anemia Panel: No results for input(s): VITAMINB12, FOLATE, FERRITIN, TIBC, IRON, RETICCTPCT in the last 72 hours. Sepsis Labs: No results for input(s): PROCALCITON, LATICACIDVEN in the last 168 hours.  Recent Results (from the past 240 hour(s))  Resp Panel by RT-PCR (Flu  A&B, Covid) Nasopharyngeal Swab     Status: None   Collection Time: 01/01/21  1:50 PM   Specimen: Nasopharyngeal Swab; Nasopharyngeal(NP) swabs in vial transport medium  Result Value Ref Range Status   SARS Coronavirus 2 by RT PCR NEGATIVE NEGATIVE Final    Comment: (NOTE) SARS-CoV-2 target nucleic acids are NOT DETECTED.  The SARS-CoV-2 RNA is generally detectable in upper respiratory specimens during the acute phase of infection. The lowest concentration of SARS-CoV-2 viral copies this assay can detect is 138 copies/mL. A negative result does not preclude SARS-Cov-2 infection and should not be used as the sole basis for treatment or other patient management decisions. A negative result may occur with  improper specimen collection/handling, submission of specimen other than nasopharyngeal swab, presence of viral mutation(s) within the areas targeted by this assay, and inadequate number of viral copies(<138 copies/mL). A negative result must be combined with  clinical observations, patient history, and epidemiological information. The expected result is Negative.  Fact Sheet for Patients:  BloggerCourse.com  Fact Sheet for Healthcare Providers:  SeriousBroker.it  This test is no t yet approved or cleared by the [place] FDA and  has been authorized for detection and/or diagnosis of SARS-CoV-2 by FDA under an Emergency Use Authorization (EUA). This EUA will remain  in effect (meaning this test can be used) for the duration of the COVID-19 declaration under Section 564(b)(1) of the Act, 21 U.S.C.section 360bbb-3(b)(1), unless the authorization is terminated  or revoked sooner.       Influenza A by PCR NEGATIVE NEGATIVE Final   Influenza B by PCR NEGATIVE NEGATIVE Final    Comment: (NOTE) The Xpert Xpress SARS-CoV-2/FLU/RSV plus assay is intended as an aid in the diagnosis of influenza from Nasopharyngeal swab specimens  and should not be used as a sole basis for treatment. Nasal washings and aspirates are unacceptable for Xpert Xpress SARS-CoV-2/FLU/RSV testing.  Fact Sheet for Patients: BloggerCourse.com  Fact Sheet for Healthcare Providers: SeriousBroker.it  This test is not yet approved or cleared by the [place] FDA and has been authorized for detection and/or diagnosis of SARS-CoV-2 by FDA under an Emergency Use Authorization (EUA). This EUA will remain in effect (meaning this test can be used) for the duration of the COVID-19 declaration under Section 564(b)(1) of the Act, 21 U.S.C. section 360bbb-3(b)(1), unless the authorization is terminated or revoked.  Performed at [facility], [address] [place], [place] [address]      Radiology Studies: CT Head Wo Contrast  Result Date: 01/01/2021 CLINICAL DATA:  Disequilibrium. Severe headache 3 days ago almost resulting in a fall with subsequent weakness. EXAM: CT HEAD WITHOUT CONTRAST TECHNIQUE: Contiguous axial images were obtained from the base of the skull through the vertex without intravenous contrast. COMPARISON:  03/30/2020 FINDINGS: Brain: There is no evidence of an acute infarct, intracranial hemorrhage, mass, midline shift, or extra-axial fluid collection. Patchy to confluent hypodensities in the cerebral white matter bilaterally have slightly progressed and are nonspecific but compatible with moderate chronic small vessel ischemic disease. There is mild-to-moderate cerebral atrophy. A chronic lacunar infarct in the right caudate nucleus is unchanged. Vascular: Calcified atherosclerosis at the skull base. No hyperdense vessel. Skull: No fracture or suspicious osseous lesion. Sinuses/Orbits: Paranasal sinuses and mastoid air cells are clear. Bilateral cataract extraction. Other: None. IMPRESSION: 1. No evidence of acute intracranial abnormality. 2. Moderate chronic small vessel  ischemic disease. Electronically Signed   By: Sebastian Ache M.D.   On: 01/01/2021 15:13   DG Chest Portable 1 View  Result Date: 01/01/2021 CLINICAL DATA:  Short of breath and dizziness EXAM: PORTABLE CHEST 1 VIEW COMPARISON:  03/30/2020 FINDINGS: Cardiac enlargement with mild vascular congestion. Perihilar and bibasilar airspace disease has developed since the prior study. No effusion. Atherosclerotic calcification aortic arch. Spinal cord stimulator midthoracic spine unchanged. IMPRESSION: Cardiac enlargement with perihilar and bilateral airspace disease suggestive of interstitial edema. Pneumonia also in the differential. Electronically Signed   By: Marlan Palau M.D.   On: 01/01/2021 14:25   Scheduled Meds: . amLODipine  5 mg Oral Daily  . apixaban  5 mg Oral BID  . atorvastatin  40 mg Oral QHS  . clopidogrel  75 mg Oral Daily  . insulin aspart  0-5 Units Subcutaneous QHS  . insulin aspart  0-9 Units Subcutaneous TID WC  . isosorbide mononitrate  60 mg Oral Daily  . losartan  100  mg Oral Daily  . metoprolol tartrate  25 mg Oral BID  . sodium chloride flush  3 mL Intravenous Q12H   Continuous Infusions: . nitroGLYCERIN 45 mcg/min (01/01/21 2213)     LOS: 0 days   Time spent:  Azucena Fallen, DO [facility]  If 7PM-7AM, please contact night-coverage [email or web address]  01/02/2021, 7:22 AM

## 2021-01-02 NOTE — Care Management Obs Status (Signed)
MEDICARE OBSERVATION STATUS NOTIFICATION   Patient Details  Name: DAINE GUNTHER MRN: [id number] Date of Birth: 18-Dec-1940   Medicare Observation Status Notification Given:  Yes    Lorri Frederick, LCSW 01/02/2021, 2:39 PM

## 2021-01-02 NOTE — Progress Notes (Signed)
LE-Patient arrived to the floor from ed at approx 11pm. Pt was alert and responsive, able to verbalize needs. Peripheral IV in (R) ac space with Nitro gtt at 45/mmin. Pt was able to transfer from stretcher to bed with staff assist x2. Pt was oriented to room and controls for the bed and call system.

## 2021-01-02 NOTE — TOC Initial Note (Signed)
Transition of Care [facility]) - Initial/Assessment Note    Patient Details  Name: Albert Powell MRN: [id number] Date of Birth: 06-May-1941  Transition of [facility] CM/SW Contact:    Joanne Chars, LCSW Phone Number: 01/02/2021, 3:04 PM  Clinical Narrative:   CSW met with pt regarding discharge recommendations.  Pt agreeable to [facility] referral.  Choice document given, no preference indicated.  Permission given to speak with wife.  PCP in place. Current equipment in home: walker, cane, shower chair.  Pt is vaccinated for covid and boosted.                  Expected Discharge Plan: [place] Barriers to Discharge: Continued Medical Work up   Patient Goals and CMS Choice Patient states their goals for this hospitalization and ongoing recovery are:: "get back home" CMS Medicare.gov Compare Post Acute Care list provided to:: Patient Choice offered to / list presented to : Patient  Expected Discharge Plan and Services Expected Discharge Plan: [place] Choice: [place] arrangements for the past 2 months: Apartment                                      Prior Living Arrangements/Services Living arrangements for the past 2 months: Apartment Lives with:: Spouse Patient language and need for interpreter reviewed:: Yes Do you feel safe going back to the place where you live?: Yes      Need for Family Participation in Patient Care: Yes (Comment) Care giver support system in place?: Yes (comment) Current home services: Other (comment) (none) Criminal Activity/Legal Involvement Pertinent to Current Situation/Hospitalization: No - Comment as needed  Activities of Daily Living      Permission Sought/Granted Permission sought to share information with : Family Supports Permission granted to share information with : Yes, Verbal Permission Granted  Share Information with NAME: wife Margie           Emotional  Assessment Appearance:: Appears stated age Attitude/Demeanor/Rapport: Engaged Affect (typically observed): Appropriate,Pleasant Orientation: : Oriented to Self,Oriented to Place,Oriented to  Time,Oriented to Situation Alcohol / Substance Use: Tobacco Use Psych Involvement: No (comment)  Admission diagnosis:  Shortness of breath [R06.02] Acute pulmonary edema ([name]) [J81.0] Hypertensive emergency [I16.1] Patient Active Problem List   Diagnosis Date Noted  . Hypertensive emergency 01/01/2021  . ACS (acute coronary syndrome) ([place])   . AKI (acute kidney injury) ([place])   . Atrial fibrillation with RVR ([place]) 03/30/2020  . Contusion of right hand 11/12/2018  . Primary osteoarthritis of both first carpometacarpal joints 11/12/2018  . Pain in joint of right shoulder 10/05/2018  . Pain in right hand 10/05/2018  . Hematoma 03/30/2018  . Degeneration of lumbar intervertebral disc 12/21/2017  . Lumbar radiculopathy 12/21/2017  . Vertigo 04/09/2017  . Hypogonadism in male 12/07/2014  . Screening for prostate cancer 12/07/2014  . Claudication of right lower extremity ([place]) 10/20/2013  . Claudication in peripheral vascular disease- Rt leg 09/20/2013  . Obesity (BMI 30-39.9)- negative sleep study in the past 09/20/2013  . Occlusion and stenosis of carotid artery without mention of cerebral infarction 05/23/2013  . Peripheral arterial disease ([place]) 05/18/2013  . Carotid artery disease ([place]) 04/14/2013  . Bradycardia, sinus 02/18/2013  . Hyperlipidemia 02/05/2013  . Tobacco abuse 02/05/2013  . Chest pain, unstable angina, negative MI 02/04/2013  . Type  2 diabetes mellitus ([place]) 02/04/2013  . CAD hx of 2 stents to RCA in 2003,  80-90% stenosis of LAD/CFX June 2014- medical Rx 02/04/2013  . Benign essential hypertension 07/10/2011  . Incisional hernia 05/22/2011   PCP:  Leeroy Cha, MD Pharmacy:   [id number] [place], [address] [place] [address]Phone: [phone]: [phone] [facility] [id number]: [phone]: [phone]    Social Determinants of Health (SDOH) Interventions    Readmission Risk Interventions No flowsheet data found.

## 2021-01-03 DIAGNOSIS — Z8249 Family history of ischemic heart disease and other diseases of the circulatory system: Secondary | ICD-10-CM | POA: Diagnosis not present

## 2021-01-03 DIAGNOSIS — I5033 Acute on chronic diastolic (congestive) heart failure: Secondary | ICD-10-CM | POA: Diagnosis present

## 2021-01-03 DIAGNOSIS — F17210 Nicotine dependence, cigarettes, uncomplicated: Secondary | ICD-10-CM | POA: Diagnosis present

## 2021-01-03 DIAGNOSIS — R0602 Shortness of breath: Secondary | ICD-10-CM | POA: Diagnosis present

## 2021-01-03 DIAGNOSIS — Z885 Allergy status to narcotic agent status: Secondary | ICD-10-CM | POA: Diagnosis not present

## 2021-01-03 DIAGNOSIS — Z66 Do not resuscitate: Secondary | ICD-10-CM | POA: Diagnosis present

## 2021-01-03 DIAGNOSIS — Z833 Family history of diabetes mellitus: Secondary | ICD-10-CM | POA: Diagnosis not present

## 2021-01-03 DIAGNOSIS — E785 Hyperlipidemia, unspecified: Secondary | ICD-10-CM | POA: Diagnosis present

## 2021-01-03 DIAGNOSIS — J449 Chronic obstructive pulmonary disease, unspecified: Secondary | ICD-10-CM | POA: Diagnosis present

## 2021-01-03 DIAGNOSIS — E1151 Type 2 diabetes mellitus with diabetic peripheral angiopathy without gangrene: Secondary | ICD-10-CM | POA: Diagnosis present

## 2021-01-03 DIAGNOSIS — R270 Ataxia, unspecified: Secondary | ICD-10-CM | POA: Diagnosis present

## 2021-01-03 DIAGNOSIS — Z888 Allergy status to other drugs, medicaments and biological substances status: Secondary | ICD-10-CM | POA: Diagnosis not present

## 2021-01-03 DIAGNOSIS — Z825 Family history of asthma and other chronic lower respiratory diseases: Secondary | ICD-10-CM | POA: Diagnosis not present

## 2021-01-03 DIAGNOSIS — Z7902 Long term (current) use of antithrombotics/antiplatelets: Secondary | ICD-10-CM | POA: Diagnosis not present

## 2021-01-03 DIAGNOSIS — I161 Hypertensive emergency: Secondary | ICD-10-CM | POA: Diagnosis present

## 2021-01-03 DIAGNOSIS — Z83438 Family history of other disorder of lipoprotein metabolism and other lipidemia: Secondary | ICD-10-CM | POA: Diagnosis not present

## 2021-01-03 DIAGNOSIS — I482 Chronic atrial fibrillation, unspecified: Secondary | ICD-10-CM | POA: Diagnosis present

## 2021-01-03 DIAGNOSIS — Z955 Presence of coronary angioplasty implant and graft: Secondary | ICD-10-CM | POA: Diagnosis not present

## 2021-01-03 DIAGNOSIS — K219 Gastro-esophageal reflux disease without esophagitis: Secondary | ICD-10-CM | POA: Diagnosis present

## 2021-01-03 DIAGNOSIS — I2510 Atherosclerotic heart disease of native coronary artery without angina pectoris: Secondary | ICD-10-CM | POA: Diagnosis present

## 2021-01-03 DIAGNOSIS — N1832 Chronic kidney disease, stage 3b: Secondary | ICD-10-CM | POA: Diagnosis present

## 2021-01-03 DIAGNOSIS — Z9049 Acquired absence of other specified parts of digestive tract: Secondary | ICD-10-CM | POA: Diagnosis not present

## 2021-01-03 DIAGNOSIS — Z79899 Other long term (current) drug therapy: Secondary | ICD-10-CM | POA: Diagnosis not present

## 2021-01-03 DIAGNOSIS — I130 Hypertensive heart and chronic kidney disease with heart failure and stage 1 through stage 4 chronic kidney disease, or unspecified chronic kidney disease: Secondary | ICD-10-CM | POA: Diagnosis present

## 2021-01-03 DIAGNOSIS — Z20822 Contact with and (suspected) exposure to covid-19: Secondary | ICD-10-CM | POA: Diagnosis present

## 2021-01-03 LAB — BASIC METABOLIC PANEL
Anion gap: 8 (ref 5–15)
BUN: 25 mg/dL — ABNORMAL HIGH (ref 8–23)
CO2: 28 mmol/L (ref 22–32)
Calcium: 8.9 mg/dL (ref 8.9–10.3)
Chloride: 100 mmol/L (ref 98–111)
Creatinine, Ser: 2.01 mg/dL — ABNORMAL HIGH (ref 0.61–1.24)
GFR, Estimated: 33 mL/min — ABNORMAL LOW (ref >60)
Glucose, Bld: 106 mg/dL — ABNORMAL HIGH (ref 70–99)
Potassium: 3.2 mmol/L — ABNORMAL LOW (ref 3.5–5.1)
Sodium: 136 mmol/L (ref 135–145)

## 2021-01-03 LAB — GLUCOSE, CAPILLARY
Glucose-Capillary: 125 mg/dL — ABNORMAL HIGH (ref 70–99)
Glucose-Capillary: 122 mg/dL — ABNORMAL HIGH (ref 70–99)

## 2021-01-03 LAB — CBC
HCT: 36.7 % — ABNORMAL LOW (ref 39.0–52.0)
Hemoglobin: 12 g/dL — ABNORMAL LOW (ref 13.0–17.0)
MCH: 28.8 pg (ref 26.0–34.0)
MCHC: 32.7 g/dL (ref 30.0–36.0)
MCV: 88.2 fL (ref 80.0–100.0)
Platelets: 241 10*3/uL (ref 150–400)
RBC: 4.16 MIL/uL — ABNORMAL LOW (ref 4.22–5.81)
RDW: 14 % (ref 11.5–15.5)
WBC: 8.8 10*3/uL (ref 4.0–10.5)
nRBC: 0 % (ref 0.0–0.2)

## 2021-01-03 MED ORDER — AMLODIPINE BESYLATE 5 MG PO TABS: 10.0000 mg | ORAL_TABLET | Freq: Every day | ORAL | 1 refills | Status: DC

## 2021-01-03 MED ORDER — METOPROLOL TARTRATE 25 MG PO TABS: 25.0000 mg | ORAL_TABLET | Freq: Two times a day (BID) | ORAL | 1 refills | Status: DC

## 2021-01-03 MED ORDER — ISOSORBIDE MONONITRATE ER 60 MG PO TB24: 60.0000 mg | ORAL_TABLET | Freq: Every day | ORAL | 1 refills | Status: DC

## 2021-01-03 MED ORDER — CLOPIDOGREL BISULFATE 75 MG PO TABS: 75.0000 mg | ORAL_TABLET | Freq: Every day | ORAL | 1 refills | Status: DC

## 2021-01-03 MED ORDER — APIXABAN 5 MG PO TABS: 5.0000 mg | ORAL_TABLET | Freq: Two times a day (BID) | ORAL | 1 refills | Status: DC

## 2021-01-03 MED ORDER — FUROSEMIDE 20 MG PO TABS: 20.0000 mg | ORAL_TABLET | Freq: Every day | ORAL | 1 refills | Status: DC

## 2021-01-03 MED ORDER — LOSARTAN POTASSIUM 50 MG PO TABS: 100.0000 mg | ORAL_TABLET | Freq: Every day | ORAL | 1 refills | Status: DC

## 2021-01-03 NOTE — Progress Notes (Signed)
AVS provided and reviewed with pt. All questions answered. Pt discharged home with wife.

## 2021-01-03 NOTE — Discharge Summary (Signed)
Physician Discharge Summary  HAYDAN MANSOURI ATF:[id number] DOB: [birth year] DOA: 01/01/2021  PCP: [name], MD  Admit date: 01/01/2021 Discharge date: 01/03/2021  Admitted From: Home Disposition: Home  Recommendations for Outpatient Follow-up:  1. Follow up with PCP in 1-2 weeks 2. Please obtain BMP/CBC in one week 3. Please follow up with cardiology as scheduled  Home Health: Physical therapy Equipment/Devices: None  Discharge Condition: Stable CODE STATUS: Full Diet recommendation: Low-salt low-fat diabetic diet  Brief/Interim Summary: Demonta Wombles Crabtreeis a 80 y.o.malewith medical history significant forchronic atrial fibrillation (previously on Eliquis, has not been taking recently), CAD s/p PCI with stenting, CASs/p left CEA, CKD stage III, T2DM, HTN, HLD, PVD who presents to the ED for evaluation of gait instability. Patient states that 1 week ago he had new severe left-sided headache. Since then he has felt as if his gait has been unstable and off balance. He has not fallen or lost consciousness. He has had some lightheadedness but denies any room spinning sensation. Today he had new onset of severe shortness of breath. He has had cough associated with white sputum production. He has not had any chest pain, subjective fevers, chills, diaphoresis, abdominal pain, dysuria. He has had some nausea without emesis. At time of admitting evaluation, patient states that his breathing his much better after initial treatment in the ED - hospitalist consulted for admission; cardiology consulted.  Patient admitted as above with essentially hypertensive emergency, flash pulmonary edema in the setting of profound medication noncompliance.  No history of atrial fibrillation admitted in acute RVR again due to medication noncompliance.  Patient was initially placed on nitro drip, cardiology was consulted.  Fortunately after we resumed patient's home medications his blood pressure  and heart rate became markedly well controlled.  Patient does have some lingering ambulatory dysfunction in the setting of recent events, PT recommending home health physical therapy which patient is agreeable to.  At this time patient has been resumed on his home medications with the addition of increased amlodipine as well as low-dose diuretic per cardiology recommendations.  Close follow-up outpatient with PCP in the next 1 to 2 weeks and cardiology as scheduled for further medication adjustment.  Patient otherwise stable and agreeable for discharge home given resolution of respiratory symptoms now able to ambulate without severe dyspnea or other symptomatology.  Discharge Diagnoses:  Principal Problem:   Hypertensive emergency Active Problems:   Type 2 diabetes mellitus ([name])   CAD hx of 2 stents to RCA in 2003,  80-90% stenosis of LAD/CFX June 2014- medical Rx   Hyperlipidemia   Benign essential hypertension   Atrial fibrillation with RVR [facility])    Discharge Instructions  Discharge Instructions    (HEART FAILURE PATIENTS) Call MD:  Anytime you have any of the following symptoms: 1) 3 pound weight gain in 24 hours or 5 pounds in 1 week 2) shortness of breath, with or without a dry hacking cough 3) swelling in the hands, feet or stomach 4) if you have to sleep on extra pillows at night in order to breathe.   Complete by: As directed    Diet - low sodium heart healthy   Complete by: As directed    Diet Carb Modified   Complete by: As directed    Increase activity slowly   Complete by: As directed      Allergies as of 01/03/2021      Reactions   Fentanyl Other (See Comments)   Behavioral changes   Gabapentin Other (See  Comments)   ankle swells   Lisinopril Other (See Comments)   unknown   Lyrica [pregabalin] Other (See Comments)   unknown   Metformin Diarrhea   Propofol Other (See Comments)   Heart rate dropped      Medication List    TAKE these medications   amLODipine 5  MG tablet Commonly known as: NORVASC Take 2 tablets (10 mg total) by mouth daily. Start taking on: January 04, 2021   apixaban 5 MG Tabs tablet Commonly known as: ELIQUIS Take 1 tablet (5 mg total) by mouth 2 (two) times daily.   atorvastatin 40 MG tablet Commonly known as: LIPITOR Take 40 mg by mouth at bedtime.   clopidogrel 75 MG tablet Commonly known as: PLAVIX Take 1 tablet (75 mg total) by mouth daily. Start taking on: January 04, 2021   furosemide 20 MG tablet Commonly known as: Lasix Take 1 tablet (20 mg total) by mouth daily.   isosorbide mononitrate 60 MG 24 hr tablet Commonly known as: IMDUR Take 1 tablet (60 mg total) by mouth daily. Start taking on: January 04, 2021   losartan 50 MG tablet Commonly known as: COZAAR Take 2 tablets (100 mg total) by mouth daily. Start taking on: January 04, 2021   metoprolol tartrate 25 MG tablet Commonly known as: LOPRESSOR Take 1 tablet (25 mg total) by mouth 2 (two) times daily.   Ozempic (1 MG/DOSE) 2 MG/1.5ML Sopn Generic drug: Semaglutide (1 MG/DOSE) Inject 2.5 mg into the skin once a week.   pantoprazole 40 MG tablet Commonly known as: PROTONIX Take 40 mg by mouth daily.   SYSTANE FREE OP Place 1 drop into both eyes at bedtime.       Follow-up Information    [facility] Follow up.   Why: [facility] will contact you to schedule your first home visit.  Contact information: [address] [place] [id number] [phone]              Allergies  Allergen Reactions  . Fentanyl Other (See Comments)    Behavioral changes  . Gabapentin Other (See Comments)    ankle swells  . Lisinopril Other (See Comments)    unknown  . Lyrica [Pregabalin] Other (See Comments)    unknown  . Metformin Diarrhea  . Propofol Other (See Comments)    Heart rate dropped    Consultations:  Cardiology  Procedures/Studies: CT Head Wo Contrast  Result Date: 01/01/2021 CLINICAL DATA:  Disequilibrium.  Severe headache 3 days ago almost resulting in a fall with subsequent weakness. EXAM: CT HEAD WITHOUT CONTRAST TECHNIQUE: Contiguous axial images were obtained from the base of the skull through the vertex without intravenous contrast. COMPARISON:  03/30/2020 FINDINGS: Brain: There is no evidence of an acute infarct, intracranial hemorrhage, mass, midline shift, or extra-axial fluid collection. Patchy to confluent hypodensities in the cerebral white matter bilaterally have slightly progressed and are nonspecific but compatible with moderate chronic small vessel ischemic disease. There is mild-to-moderate cerebral atrophy. A chronic lacunar infarct in the right caudate nucleus is unchanged. Vascular: Calcified atherosclerosis at the skull base. No hyperdense vessel. Skull: No fracture or suspicious osseous lesion. Sinuses/Orbits: Paranasal sinuses and mastoid air cells are clear. Bilateral cataract extraction. Other: None. IMPRESSION: 1. No evidence of acute intracranial abnormality. 2. Moderate chronic small vessel ischemic disease. Electronically Signed   By: Sebastian AcheAllen  Grady M.D.   On: 01/01/2021 15:13   DG Chest Portable 1 View  Result Date: 01/01/2021 CLINICAL DATA:  Short of breath and dizziness EXAM: PORTABLE CHEST 1 VIEW COMPARISON:  03/30/2020 FINDINGS: Cardiac enlargement with mild vascular congestion. Perihilar and bibasilar airspace disease has developed since the prior study. No effusion. Atherosclerotic calcification aortic arch. Spinal cord stimulator midthoracic spine unchanged. IMPRESSION: Cardiac enlargement with perihilar and bilateral airspace disease suggestive of interstitial edema. Pneumonia also in the differential. Electronically Signed   By: Marlan Palau M.D.   On: 01/01/2021 14:25    Subjective: No acute issues/events overnight   Discharge Exam: Vitals:   01/03/21 0733 01/03/21 1159  BP: 117/80 118/67  Pulse: 72 67  Resp: 15 15  Temp: 98.1 F (36.7 C) 98.5 F (36.9 C)   SpO2: 97% 100%   Vitals:   01/03/21 0619 01/03/21 0733 01/03/21 0749 01/03/21 1159  BP:  117/80  118/67  Pulse:  72  67  Resp:  15  15  Temp:  98.1 F (36.7 C)  98.5 F (36.9 C)  TempSrc:  Oral  Oral  SpO2: 90% 97%  100%  Weight:   101.7 kg     General: Pt is alert, awake, not in acute distress Cardiovascular: RRR, S1/S2 +, no rubs, no gallops Respiratory: CTA bilaterally, no wheezing, no rhonchi Abdominal: Soft, NT, ND, bowel sounds + Extremities: no edema, no cyanosis    The results of significant diagnostics from this hospitalization (including imaging, microbiology, ancillary and laboratory) are listed below for reference.     Microbiology: Recent Results (from the past 240 hour(s))  Resp Panel by RT-PCR (Flu A&B, Covid) Nasopharyngeal Swab     Status: None   Collection Time: 01/01/21  1:50 PM   Specimen: Nasopharyngeal Swab; Nasopharyngeal(NP) swabs in vial transport medium  Result Value Ref Range Status   SARS Coronavirus 2 by RT PCR NEGATIVE NEGATIVE Final    Comment: (NOTE) SARS-CoV-2 target nucleic acids are NOT DETECTED.  The SARS-CoV-2 RNA is generally detectable in upper respiratory specimens during the acute phase of infection. The lowest concentration of SARS-CoV-2 viral copies this assay can detect is 138 copies/mL. A negative result does not preclude SARS-Cov-2 infection and should not be used as the sole basis for treatment or other patient management decisions. A negative result may occur with  improper specimen collection/handling, submission of specimen other than nasopharyngeal swab, presence of viral mutation(s) within the areas targeted by this assay, and inadequate number of viral copies(<138 copies/mL). A negative result must be combined with clinical observations, patient history, and epidemiological information. The expected result is Negative.  Fact Sheet for Patients:  BloggerCourse.com  Fact Sheet for  Healthcare Providers:  SeriousBroker.it  This test is no t yet approved or cleared by the [place] FDA and  has been authorized for detection and/or diagnosis of SARS-CoV-2 by FDA under an Emergency Use Authorization (EUA). This EUA will remain  in effect (meaning this test can be used) for the duration of the COVID-19 declaration under Section 564(b)(1) of the Act, 21 U.S.C.section 360bbb-3(b)(1), unless the authorization is terminated  or revoked sooner.       Influenza A by PCR NEGATIVE NEGATIVE Final   Influenza B by PCR NEGATIVE NEGATIVE Final    Comment: (NOTE) The Xpert Xpress SARS-CoV-2/FLU/RSV plus assay is intended as an aid in the diagnosis of influenza from Nasopharyngeal swab specimens and should not be used as a sole basis for treatment. Nasal washings and aspirates are unacceptable for Xpert Xpress SARS-CoV-2/FLU/RSV testing.  Fact Sheet for Patients: BloggerCourse.com  Fact Sheet for Healthcare Providers: SeriousBroker.it  This test is not yet approved or cleared by the [place] and has been authorized for detection and/or diagnosis of SARS-CoV-2 by FDA under an Emergency Use Authorization (EUA). This EUA will remain in effect (meaning this test can be used) for the duration of the COVID-19 declaration under Section 564(b)(1) of the Act, 21 U.S.C. section 360bbb-3(b)(1), unless the authorization is terminated or revoked.  Performed at [facility], [address] [place], [place] [address]   Urine culture     Status: None   Collection Time: 01/01/21  1:51 PM   Specimen: Urine, Random  Result Value Ref Range Status   Specimen Description URINE, RANDOM  Final   Special Requests NONE  Final   Culture   Final    NO GROWTH Performed at [facility], [address], [place], [place] [address]    Report Status 01/02/2021 FINAL  Final     Labs: BNP (last 3  results) Recent Labs    03/30/20 1251 01/01/21 1350  BNP 199.3* 1,024.5*   Basic Metabolic Panel: Recent Labs  Lab 01/01/21 1314 01/02/21 0011 01/03/21 0116  NA 135 137 136  K 3.2* 4.3 3.2*  CL 99 100 100  CO2 24 25 28   GLUCOSE 228* 194* 106*  BUN 11 14 25*  CREATININE 1.49* 1.62* 2.01*  CALCIUM 8.6* 8.8* 8.9  MG  --  1.9  2.0  --    Liver Function Tests: Recent Labs  Lab 01/01/21 1314  AST 19  ALT 26  ALKPHOS 76  BILITOT 1.1  PROT 6.3*  ALBUMIN 3.2*   No results for input(s): LIPASE, AMYLASE in the last 168 hours. No results for input(s): AMMONIA in the last 168 hours. CBC: Recent Labs  Lab 01/01/21 1314 01/02/21 0011 01/03/21 0116  WBC 9.2 10.1 8.8  NEUTROABS 7.2  --   --   HGB 14.4 14.1 12.0*  HCT 43.0 42.4 36.7*  MCV 86.7 86.9 88.2  PLT 247 255 241   Cardiac Enzymes: No results for input(s): CKTOTAL, CKMB, CKMBINDEX, TROPONINI in the last 168 hours. BNP: Invalid input(s): POCBNP CBG: Recent Labs  Lab 01/02/21 1158 01/02/21 1627 01/02/21 2047 01/03/21 0800 01/03/21 1201  GLUCAP 166* 95 142* 125* 122*   D-Dimer No results for input(s): DDIMER in the last 72 hours. Hgb A1c Recent Labs    01/02/21 0011  HGBA1C 7.5*   Lipid Profile No results for input(s): CHOL, HDL, LDLCALC, TRIG, CHOLHDL, LDLDIRECT in the last 72 hours. Thyroid function studies No results for input(s): TSH, T4TOTAL, T3FREE, THYROIDAB in the last 72 hours.  Invalid input(s): FREET3 Anemia work up No results for input(s): VITAMINB12, FOLATE, FERRITIN, TIBC, IRON, RETICCTPCT in the last 72 hours. Urinalysis    Component Value Date/Time   COLORURINE YELLOW 01/01/2021 1351   APPEARANCEUR CLEAR 01/01/2021 1351   LABSPEC 1.009 01/01/2021 1351   PHURINE 6.0 01/01/2021 1351   GLUCOSEU >=500 (A) 01/01/2021 1351   HGBUR SMALL (A) 01/01/2021 1351   BILIRUBINUR NEGATIVE 01/01/2021 1351   KETONESUR NEGATIVE 01/01/2021 1351   PROTEINUR >=300 (A) 01/01/2021 1351    UROBILINOGEN 1.0 05/31/2013 1432   NITRITE NEGATIVE 01/01/2021 1351   LEUKOCYTESUR NEGATIVE 01/01/2021 1351   Sepsis Labs Invalid input(s): PROCALCITONIN,  WBC,  LACTICIDVEN Microbiology Recent Results (from the past 240 hour(s))  Resp Panel by RT-PCR (Flu A&B, Covid) Nasopharyngeal Swab     Status: None   Collection Time: 01/01/21  1:50 PM   Specimen: Nasopharyngeal Swab;  Nasopharyngeal(NP) swabs in vial transport medium  Result Value Ref Range Status   SARS Coronavirus 2 by RT PCR NEGATIVE NEGATIVE Final    Comment: (NOTE) SARS-CoV-2 target nucleic acids are NOT DETECTED.  The SARS-CoV-2 RNA is generally detectable in upper respiratory specimens during the acute phase of infection. The lowest concentration of SARS-CoV-2 viral copies this assay can detect is 138 copies/mL. A negative result does not preclude SARS-Cov-2 infection and should not be used as the sole basis for treatment or other patient management decisions. A negative result may occur with  improper specimen collection/handling, submission of specimen other than nasopharyngeal swab, presence of viral mutation(s) within the areas targeted by this assay, and inadequate number of viral copies(<138 copies/mL). A negative result must be combined with clinical observations, patient history, and epidemiological information. The expected result is Negative.  Fact Sheet for Patients:  BloggerCourse.com  Fact Sheet for Healthcare Providers:  SeriousBroker.it  This test is no t yet approved or cleared by the [place] FDA and  has been authorized for detection and/or diagnosis of SARS-CoV-2 by FDA under an Emergency Use Authorization (EUA). This EUA will remain  in effect (meaning this test can be used) for the duration of the COVID-19 declaration under Section 564(b)(1) of the Act, 21 U.S.C.section 360bbb-3(b)(1), unless the authorization is terminated  or revoked  sooner.       Influenza A by PCR NEGATIVE NEGATIVE Final   Influenza B by PCR NEGATIVE NEGATIVE Final    Comment: (NOTE) The Xpert Xpress SARS-CoV-2/FLU/RSV plus assay is intended as an aid in the diagnosis of influenza from Nasopharyngeal swab specimens and should not be used as a sole basis for treatment. Nasal washings and aspirates are unacceptable for Xpert Xpress SARS-CoV-2/FLU/RSV testing.  Fact Sheet for Patients: BloggerCourse.com  Fact Sheet for Healthcare Providers: SeriousBroker.it  This test is not yet approved or cleared by the [place] FDA and has been authorized for detection and/or diagnosis of SARS-CoV-2 by FDA under an Emergency Use Authorization (EUA). This EUA will remain in effect (meaning this test can be used) for the duration of the COVID-19 declaration under Section 564(b)(1) of the Act, 21 U.S.C. section 360bbb-3(b)(1), unless the authorization is terminated or revoked.  Performed at [facility], [address], [place], [place] [address]   Urine culture     Status: None   Collection Time: 01/01/21  1:51 PM   Specimen: Urine, Random  Result Value Ref Range Status   Specimen Description URINE, RANDOM  Final   Special Requests NONE  Final   Culture   Final    NO GROWTH Performed at [facility], [address], [place], [place] [address]    Report Status 01/02/2021 FINAL  Final     Time coordinating discharge: Over 30 minutes  SIGNED:   Azucena Fallen, DO [facility] 01/03/2021, 2:09 PM Pager   If 7PM-7AM, please contact night-coverage [email or web address]

## 2021-01-03 NOTE — TOC Transition Note (Signed)
Transition of Care [facility]) - CM/SW Discharge Note   Patient Details  Name: Albert Powell MRN: [id number] Date of Birth: November 15, 1940  Transition of [facility] CM/SW Contact:  Lorri Frederick, LCSW Phone Number: 01/03/2021, 2:00 PM   Clinical Narrative:   Pt discharging home with [facility] services.  No DME recommendations/needs.  No other needs identified.     Final next level of care: Home w Home Health Services Barriers to Discharge: Barriers Resolved   Patient Goals and CMS Choice Patient states their goals for this hospitalization and ongoing recovery are:: "get back home" CMS Medicare.gov Compare Post Acute Care list provided to:: Patient Choice offered to / list presented to : Patient  Discharge Placement                       Discharge Plan and Services     Post Acute Care Choice: Home Health          DME Arranged: N/A         HH Arranged: PT HH Agency: [facility] Date [facility] Agency Contacted: 01/03/21 Time HH Agency Contacted: [phone] Representative spoke with at [facility] Agency: Elnita Maxwell  Social Determinants of Health (SDOH) Interventions     Readmission Risk Interventions No flowsheet data found.

## 2021-01-03 NOTE — Progress Notes (Signed)
Progress Note  Patient Name: Albert Powell Date of Encounter: 01/03/2021  Primary Cardiologist:   Nanetta Batty, MD   Subjective   Mild dizziness but anxious to go home.   Inpatient Medications    Scheduled Meds: . amLODipine  5 mg Oral Daily  . apixaban  5 mg Oral BID  . atorvastatin  40 mg Oral QHS  . clopidogrel  75 mg Oral Daily  . insulin aspart  0-5 Units Subcutaneous QHS  . insulin aspart  0-9 Units Subcutaneous TID WC  . isosorbide mononitrate  60 mg Oral Daily  . losartan  100 mg Oral Daily  . metoprolol tartrate  25 mg Oral BID  . sodium chloride flush  3 mL Intravenous Q12H   Continuous Infusions:  PRN Meds: acetaminophen **OR** acetaminophen, ondansetron **OR** ondansetron (ZOFRAN) IV   Vital Signs    Vitals:   01/03/21 0619 01/03/21 0733 01/03/21 0749 01/03/21 1159  BP:  117/80  118/67  Pulse:  72  67  Resp:  15  15  Temp:  98.1 F (36.7 C)  98.5 F (36.9 C)  TempSrc:  Oral  Oral  SpO2: 90% 97%  100%  Weight:   101.7 kg     Intake/Output Summary (Last 24 hours) at 01/03/2021 1404 Last data filed at 01/03/2021 0900 Gross per 24 hour  Intake 451.82 ml  Output 100 ml  Net 351.82 ml   Filed Weights   01/03/21 0749  Weight: 101.7 kg    Telemetry    Off tele - Personally Reviewed  ECG    NA - Personally Reviewed  Physical Exam   GEN: No  acute distress.   Neck: No  JVD Cardiac: RRR, no murmurs, rubs, or gallops.  Respiratory: Clear   to auscultation bilaterally. GI: Soft, nontender, non-distended, normal bowel sounds  MS:  No edema; No deformity. Neuro:   Nonfocal  Psych: Oriented and appropriate    Labs    Chemistry Recent Labs  Lab 01/01/21 1314 01/02/21 0011 01/03/21 0116  NA 135 137 136  K 3.2* 4.3 3.2*  CL 99 100 100  CO2 24 25 28   GLUCOSE 228* 194* 106*  BUN 11 14 25*  CREATININE 1.49* 1.62* 2.01*  CALCIUM 8.6* 8.8* 8.9  PROT 6.3*  --   --   ALBUMIN 3.2*  --   --   AST 19  --   --   ALT 26  --   --    ALKPHOS 76  --   --   BILITOT 1.1  --   --   GFRNONAA 48* 43* 33*  ANIONGAP 12 12 8      Hematology Recent Labs  Lab 01/01/21 1314 01/02/21 0011 01/03/21 0116  WBC 9.2 10.1 8.8  RBC 4.96 4.88 4.16*  HGB 14.4 14.1 12.0*  HCT 43.0 42.4 36.7*  MCV 86.7 86.9 88.2  MCH 29.0 28.9 28.8  MCHC 33.5 33.3 32.7  RDW 13.6 13.8 14.0  PLT 247 255 241    Cardiac EnzymesNo results for input(s): TROPONINI in the last 168 hours. No results for input(s): TROPIPOC in the last 168 hours.   BNP Recent Labs  Lab 01/01/21 1350  BNP 1,024.5*     DDimer No results for input(s): DDIMER in the last 168 hours.   Radiology    CT Head Wo Contrast  Result Date: 01/01/2021 CLINICAL DATA:  Disequilibrium. Severe headache 3 days ago almost resulting in a fall with subsequent weakness. EXAM: CT HEAD WITHOUT CONTRAST  TECHNIQUE: Contiguous axial images were obtained from the base of the skull through the vertex without intravenous contrast. COMPARISON:  03/30/2020 FINDINGS: Brain: There is no evidence of an acute infarct, intracranial hemorrhage, mass, midline shift, or extra-axial fluid collection. Patchy to confluent hypodensities in the cerebral white matter bilaterally have slightly progressed and are nonspecific but compatible with moderate chronic small vessel ischemic disease. There is mild-to-moderate cerebral atrophy. A chronic lacunar infarct in the right caudate nucleus is unchanged. Vascular: Calcified atherosclerosis at the skull base. No hyperdense vessel. Skull: No fracture or suspicious osseous lesion. Sinuses/Orbits: Paranasal sinuses and mastoid air cells are clear. Bilateral cataract extraction. Other: None. IMPRESSION: 1. No evidence of acute intracranial abnormality. 2. Moderate chronic small vessel ischemic disease. Electronically Signed   By: Sebastian Ache M.D.   On: 01/01/2021 15:13    Cardiac Studies   ECHO:  1. Left ventricular ejection fraction, by estimation, is 55 to 60%. The   left ventricle has normal function. The left ventricle has no regional  wall motion abnormalities. Left ventricular diastolic parameters are  consistent with Grade I diastolic  dysfunction (impaired relaxation).  2. Right ventricular systolic function is normal. The right ventricular  size is normal.  3. The mitral valve is normal in structure. No evidence of mitral valve  regurgitation. No evidence of mitral stenosis.  4. The aortic valve is tricuspid. Aortic valve regurgitation is not  visualized. Mild aortic valve sclerosis is present, with no evidence of  aortic valve stenosis.  5. The inferior vena cava is normal in size with greater than 50%  respiratory variability, suggesting right atrial pressure of 3 mmHg.    Patient Profile     80 y.o. male with progressive worsening weakness, "wobbly feeling" and dyspnea on exertion.  He was admitted with acute on chronic diastolic HF and hypertensive urgency.    Assessment & Plan    ATRIAL FIB:  Back on DOAC.  No other change in therapy. s.    CAD:  No acute coronary syndrome.  No in patient testing is indicated.    ACUTE ON CHRONIC DIASTOLIC HEART FAILURE:       He is going home with Lasix daily scheduled.  However, we discussed PRN dosing of this.    HYPERTENSIVE URGENCY:  BP is improved.  Meds as on MAR.  I discussed with the patient and his wife the idea that if his BP runs low at home, having resumed so many meds at once, that he would need to contact us to make med adjustments.  He was warned against stopping his meds again on his own.     For questions or updates, please contact [facility] Please consult [email or web address] for contact info under Cardiology/STEMI.   Signed, Rollene Rotunda, MD  01/03/2021, 2:04 PM

## 2021-01-03 NOTE — Plan of Care (Signed)

## 2021-01-03 NOTE — Plan of Care (Signed)
  Problem: Education: Goal: Knowledge of General Education information will improve Description: Including pain rating scale, medication(s)/side effects and non-pharmacologic comfort measures 01/03/2021 1430 by Netta Cedars D, LPN Outcome: Adequate for Discharge 01/03/2021 1022 by Netta Cedars D, LPN Outcome: Progressing   Problem: Health Behavior/Discharge Planning: Goal: Ability to manage health-related needs will improve 01/03/2021 1430 by Netta Cedars D, LPN Outcome: Adequate for Discharge 01/03/2021 1022 by Netta Cedars D, LPN Outcome: Progressing   Problem: Clinical Measurements: Goal: Ability to maintain clinical measurements within normal limits will improve 01/03/2021 1430 by Netta Cedars D, LPN Outcome: Adequate for Discharge 01/03/2021 1022 by Netta Cedars D, LPN Outcome: Progressing Goal: Will remain free from infection 01/03/2021 1430 by Netta Cedars D, LPN Outcome: Adequate for Discharge 01/03/2021 1022 by Netta Cedars D, LPN Outcome: Progressing Goal: Diagnostic test results will improve 01/03/2021 1430 by Netta Cedars D, LPN Outcome: Adequate for Discharge 01/03/2021 1022 by Netta Cedars D, LPN Outcome: Progressing Goal: Respiratory complications will improve 01/03/2021 1430 by Netta Cedars D, LPN Outcome: Adequate for Discharge 01/03/2021 1022 by Netta Cedars D, LPN Outcome: Progressing Goal: Cardiovascular complication will be avoided 01/03/2021 1430 by Netta Cedars D, LPN Outcome: Adequate for Discharge 01/03/2021 1022 by Netta Cedars D, LPN Outcome: Progressing   Problem: Activity: Goal: Risk for activity intolerance will decrease 01/03/2021 1430 by Netta Cedars D, LPN Outcome: Adequate for Discharge 01/03/2021 1022 by Netta Cedars D, LPN Outcome: Progressing   Problem: Nutrition: Goal: Adequate nutrition will be maintained 01/03/2021 1430 by Netta Cedars D, LPN Outcome: Adequate for Discharge 01/03/2021 1022 by Netta Cedars D, LPN Outcome: Progressing   Problem:  Coping: Goal: Level of anxiety will decrease 01/03/2021 1430 by Netta Cedars D, LPN Outcome: Adequate for Discharge 01/03/2021 1022 by Netta Cedars D, LPN Outcome: Progressing   Problem: Elimination: Goal: Will not experience complications related to bowel motility 01/03/2021 1430 by Netta Cedars D, LPN Outcome: Adequate for Discharge 01/03/2021 1022 by Netta Cedars D, LPN Outcome: Progressing Goal: Will not experience complications related to urinary retention 01/03/2021 1430 by Netta Cedars D, LPN Outcome: Adequate for Discharge 01/03/2021 1022 by Netta Cedars D, LPN Outcome: Progressing   Problem: Pain Managment: Goal: General experience of comfort will improve 01/03/2021 1430 by Netta Cedars D, LPN Outcome: Adequate for Discharge 01/03/2021 1022 by Netta Cedars D, LPN Outcome: Progressing   Problem: Safety: Goal: Ability to remain free from injury will improve 01/03/2021 1430 by Netta Cedars D, LPN Outcome: Adequate for Discharge 01/03/2021 1022 by Netta Cedars D, LPN Outcome: Progressing   Problem: Skin Integrity: Goal: Risk for impaired skin integrity will decrease 01/03/2021 1430 by Netta Cedars D, LPN Outcome: Adequate for Discharge 01/03/2021 1022 by Netta Cedars D, LPN Outcome: Progressing

## 2021-01-10 DIAGNOSIS — K219 Gastro-esophageal reflux disease without esophagitis: Secondary | ICD-10-CM | POA: Diagnosis not present

## 2021-01-10 DIAGNOSIS — Z7901 Long term (current) use of anticoagulants: Secondary | ICD-10-CM | POA: Diagnosis not present

## 2021-01-10 DIAGNOSIS — J449 Chronic obstructive pulmonary disease, unspecified: Secondary | ICD-10-CM | POA: Diagnosis not present

## 2021-01-10 DIAGNOSIS — I6529 Occlusion and stenosis of unspecified carotid artery: Secondary | ICD-10-CM | POA: Diagnosis not present

## 2021-01-10 DIAGNOSIS — I2510 Atherosclerotic heart disease of native coronary artery without angina pectoris: Secondary | ICD-10-CM | POA: Diagnosis not present

## 2021-01-10 DIAGNOSIS — R270 Ataxia, unspecified: Secondary | ICD-10-CM | POA: Diagnosis not present

## 2021-01-10 DIAGNOSIS — E785 Hyperlipidemia, unspecified: Secondary | ICD-10-CM | POA: Diagnosis not present

## 2021-01-10 DIAGNOSIS — N183 Chronic kidney disease, stage 3 unspecified: Secondary | ICD-10-CM | POA: Diagnosis not present

## 2021-01-10 DIAGNOSIS — I482 Chronic atrial fibrillation, unspecified: Secondary | ICD-10-CM | POA: Diagnosis not present

## 2021-01-10 DIAGNOSIS — M519 Unspecified thoracic, thoracolumbar and lumbosacral intervertebral disc disorder: Secondary | ICD-10-CM | POA: Diagnosis not present

## 2021-01-10 DIAGNOSIS — I129 Hypertensive chronic kidney disease with stage 1 through stage 4 chronic kidney disease, or unspecified chronic kidney disease: Secondary | ICD-10-CM | POA: Diagnosis not present

## 2021-01-10 DIAGNOSIS — E1122 Type 2 diabetes mellitus with diabetic chronic kidney disease: Secondary | ICD-10-CM | POA: Diagnosis not present

## 2021-01-10 DIAGNOSIS — Z7902 Long term (current) use of antithrombotics/antiplatelets: Secondary | ICD-10-CM | POA: Diagnosis not present

## 2021-01-10 DIAGNOSIS — E1151 Type 2 diabetes mellitus with diabetic peripheral angiopathy without gangrene: Secondary | ICD-10-CM | POA: Diagnosis not present

## 2021-01-10 DIAGNOSIS — F17210 Nicotine dependence, cigarettes, uncomplicated: Secondary | ICD-10-CM | POA: Diagnosis not present

## 2021-01-10 DIAGNOSIS — J810 Acute pulmonary edema: Secondary | ICD-10-CM | POA: Diagnosis not present

## 2021-01-10 DIAGNOSIS — E1136 Type 2 diabetes mellitus with diabetic cataract: Secondary | ICD-10-CM | POA: Diagnosis not present

## 2021-01-10 DIAGNOSIS — Z955 Presence of coronary angioplasty implant and graft: Secondary | ICD-10-CM | POA: Diagnosis not present

## 2021-01-10 DIAGNOSIS — Z79899 Other long term (current) drug therapy: Secondary | ICD-10-CM | POA: Diagnosis not present

## 2021-01-18 DIAGNOSIS — R2681 Unsteadiness on feet: Secondary | ICD-10-CM | POA: Diagnosis not present

## 2021-01-18 DIAGNOSIS — I480 Paroxysmal atrial fibrillation: Secondary | ICD-10-CM | POA: Diagnosis not present

## 2021-01-18 DIAGNOSIS — Z7984 Long term (current) use of oral hypoglycemic drugs: Secondary | ICD-10-CM | POA: Diagnosis not present

## 2021-01-18 DIAGNOSIS — I6529 Occlusion and stenosis of unspecified carotid artery: Secondary | ICD-10-CM | POA: Diagnosis not present

## 2021-01-18 DIAGNOSIS — I10 Essential (primary) hypertension: Secondary | ICD-10-CM | POA: Diagnosis not present

## 2021-01-18 DIAGNOSIS — E1165 Type 2 diabetes mellitus with hyperglycemia: Secondary | ICD-10-CM | POA: Diagnosis not present

## 2021-01-18 DIAGNOSIS — J449 Chronic obstructive pulmonary disease, unspecified: Secondary | ICD-10-CM | POA: Diagnosis not present

## 2021-01-18 DIAGNOSIS — I2510 Atherosclerotic heart disease of native coronary artery without angina pectoris: Secondary | ICD-10-CM | POA: Diagnosis not present

## 2021-01-18 DIAGNOSIS — F3341 Major depressive disorder, recurrent, in partial remission: Secondary | ICD-10-CM | POA: Diagnosis not present

## 2021-01-18 DIAGNOSIS — E1159 Type 2 diabetes mellitus with other circulatory complications: Secondary | ICD-10-CM | POA: Diagnosis not present

## 2021-02-07 ENCOUNTER — Ambulatory Visit: Payer: Medicare Other | Admitting: Family

## 2021-02-07 NOTE — Progress Notes (Deleted)
Office Visit    Patient Name: Albert Powell Date of Encounter: 02/07/2021  PCP:  [name], MD   [facility]  Cardiologist:  Nanetta Batty, MD *** Advanced Practice Provider:  No care team member to display Electrophysiologist:  None    Chief Complaint    Albert Powell is a 80 y.o. male with a hx of chronic atrial fibrillation, chronic diastolic heart failure, CAD s/p stent X2 to RCA in 2003, carotid artery stenosis s/p left CEA, CKD 3, DM 2, HTN, HLD, PVD*** presents today for hospital follow-up  Past Medical History    Past Medical History:  Diagnosis Date  . Atrial fibrillation ([name]) 03/30/2020  . Bradycardia, sinus 02/18/2013  . CAD (coronary artery disease)    stent to RCA 2003 also had 40% lesion at that time; NUCLEAR STRESS TEST, 01/16/2010 - normal  now with cath 80-90% stenosis in LAD, will try medical therapy if no improvement PCI  . Carotid artery disease ([name])   . Cataract   . Claudication ([name])    LEA DUPLEX, 07/07/2008 - Normal  . COPD (chronic obstructive pulmonary disease) ([name])   . DDD (degenerative disc disease) 2008  . Diabetes mellitus   . GERD (gastroesophageal reflux disease)   . H/O hiatal hernia   . History of colonoscopy 2004   finding of tics and AMV only no polpys  . Hyperlipidemia 02/05/2013  . Hypertension   . Onychomycosis   . Rosacea   . Tobacco abuse 02/05/2013   Past Surgical History:  Procedure Laterality Date  . ABDOMINAL AORTOGRAM W/LOWER EXTREMITY N/A 03/30/2018   Procedure: ABDOMINAL AORTOGRAM W/LOWER EXTREMITY;  Surgeon: Nada Libman, MD;  Location: [facility];  Service: Cardiovascular;  Laterality: N/A;  . ABDOMINAL AORTOGRAM W/LOWER EXTREMITY Bilateral 08/27/2020   Procedure: ABDOMINAL AORTOGRAM W/LOWER EXTREMITY;  Surgeon: [name], MD;  Location: [facility];  Service: Cardiovascular;  Laterality: Bilateral;  . APPENDECTOMY    . CARDIAC CATHETERIZATION   08/29/2002   2-vessel CAD with high-grade stenoses in RCA; stenting to RCA  . CARDIAC CATHETERIZATION  2014   80-90% lesion will try medical therapy  . CARDIAC SURGERY     stents  2003  . CARDIOVERSION N/A 11/01/2013   Procedure: Dimple Nanas COMPRESSION;  Surgeon: [name], MD;  Location: [facility] CATH LAB;  Service: Cardiovascular;  Laterality: N/A;  . CAROTID ANGIOGRAM N/A 04/25/2013   Procedure: CAROTID ANGIOGRAM;  Surgeon: [name], MD;  Location: [facility] CATH LAB;  Service: Cardiovascular;  Laterality: N/A;  . CAROTID ENDARTERECTOMY    . COLON RESECTION  3/04, 6/04    1 bleeding diverticulitis, 2 complete colectomy  . COLON SURGERY  2005   renal pouch rectal anastimosis  . CORONARY ANGIOPLASTY WITH STENT PLACEMENT  09/2002   2 stents to RCA  . ENDARTERECTOMY Left 06/02/2013   Procedure: ENDARTERECTOMY CAROTID-LEFT;  Surgeon: Nada Libman, MD;  Location: [facility] OR;  Service: Vascular;  Laterality: Left;  . INTRAVASCULAR PRESSURE WIRE/FFR STUDY N/A 04/02/2020   Procedure: INTRAVASCULAR PRESSURE WIRE/FFR STUDY;  Surgeon: [name], MD;  Location: [facility];  Service: Cardiovascular;  Laterality: N/A;  DFR - RCA  . LEFT HEART CATH AND CORONARY ANGIOGRAPHY N/A 04/02/2020   Procedure: LEFT HEART CATH AND CORONARY ANGIOGRAPHY;  Surgeon: [name], MD;  Location: [facility];  Service: Cardiovascular;  Laterality: N/A;  . LEFT HEART CATHETERIZATION WITH CORONARY ANGIOGRAM N/A 02/08/2013  Procedure: LEFT HEART CATHETERIZATION WITH CORONARY ANGIOGRAM;  Surgeon: [name], MD;  Location[facility] CATH LAB;  Service: Cardiovascular;  Laterality: N/A;  . LOWER EXTREMITY ANGIOGRAM N/A 10/20/2013   Procedure: LOWER EXTREMITY ANGIOGRAM;  Surgeon: [name], MD;  Location: [facility] CATH LAB;  Service: Cardiovascular;  Laterality: N/A;  . PATCH ANGIOPLASTY Left 06/02/2013   Procedure: PATCH ANGIOPLASTY;  Surgeon: Nada Libman, MD;  Location: [facility] OR;  Service:  Vascular;  Laterality: Left;  . PERIPHERAL VASCULAR INTERVENTION Bilateral 03/30/2018   Procedure: PERIPHERAL VASCULAR INTERVENTION;  Surgeon: Nada Libman, MD;  Location: [facility];  Service: Cardiovascular;  Laterality: Bilateral;  common iliacs  . TONSILLECTOMY      Allergies  Allergies  Allergen Reactions  . Fentanyl Other (See Comments)    Behavioral changes  . Gabapentin Other (See Comments)    ankle swells  . Lisinopril Other (See Comments)    unknown  . Lyrica [Pregabalin] Other (See Comments)    unknown  . Metformin Diarrhea  . Propofol Other (See Comments)    Heart rate dropped    History of Present Illness    Albert Powell is a 80 y.o. male with a hx of *** last seen while hospitalized.  Coronary artery disease is s/p stenting x2 to the RCA in 2003 with known residual LAD disease recommended for medical management.  Most recent cardiac catheterization July 2021 with an proximal RCA stent with intermediate lesion in the 70% range with negative FFR just beyond mid RCA with otherwise noncritical coronary artery disease.  And previous history of diamondback orbital roof rotational atherectomy, PTA and stenting of right external iliac artery by Dr.  10/20/2013 with excellent result.  Underwent angiography with Dr. [name] July 2019 with DDx stents placed to iliac bifurcation.  Most recent PV angiogram December 2021 with patent VBX stents though noted high-grade exophytic calcific disease in distal right external neck artery and right common femoral artery which would require surgical treatment with endarterectomy and angioplasty.  He was recommended to follow-up with vascular surgery but never did so.  He was admitted 01/01/2021 due to hypertensive emergency, acute on chronic diastolic heart failure, flash pulmonary edema in the setting of profound medication noncompliance.  Home medications were resumed and blood pressure and heart rate were well controlled.   Echocardiogram while admitted with normal LVEF 55 to 60%, no R WMA, grade 1 diastolic dysfunction, RV normal size and function, mild aortic valve sclerosis without stenosis.   EKGs/Labs/Other Studies Reviewed:   The following studies were reviewed today: ***  EKG:  EKG is *** ordered today.  The ekg ordered today demonstrates ***  Recent Labs: 03/30/2020: TSH 3.557 01/01/2021: ALT 26; B Natriuretic Peptide 1,024.5 01/02/2021: Magnesium 2.0; Magnesium 1.9 01/03/2021: BUN 25; Creatinine, Ser 2.01; Hemoglobin 12.0; Platelets 241; Potassium 3.2; Sodium 136  Recent Lipid Panel    Component Value Date/Time   CHOL 122 03/30/2020 1837   TRIG 85 03/30/2020 1837   HDL 53 03/30/2020 1837   CHOLHDL 2.3 03/30/2020 1837   VLDL 17 03/30/2020 1837   LDLCALC 52 03/30/2020 1837    Home Medications   No outpatient medications have been marked as taking for the 02/07/21 encounter (Appointment) with Alver Sorrow, NP.   Current Facility-Administered Medications for the 02/07/21 encounter (Appointment) with Alver Sorrow, NP  Medication  . sodium chloride flush (NS) 0.9 % injection 3 mL     Review of Systems  All other systems reviewed  and are otherwise negative except as noted above.  Physical Exam    VS:  There were no vitals taken for this visit. , BMI There is no height or weight on file to calculate BMI.  Wt Readings from Last 3 Encounters:  01/03/21 224 lb 3.3 oz (101.7 kg)  08/27/20 230 lb (104.3 kg)  08/17/20 231 lb 9.6 oz (105.1 kg)     GEN: Well nourished, well developed, in no acute distress. HEENT: normal. Neck: Supple, no JVD, carotid bruits, or masses. Cardiac: ***RRR, no murmurs, rubs, or gallops. No clubbing, cyanosis, edema.  ***Radials/DP/PT 2+ and equal bilaterally.  Respiratory:  ***Respirations regular and unlabored, clear to auscultation bilaterally. GI: Soft, nontender, nondistended. MS: No deformity or atrophy. Skin: Warm and dry, no rash. Neuro:  Strength  and sensation are intact. Psych: Normal affect.  Assessment & Plan    1. CAD- 2. Atrial fibrillation- 3. Hyperlipidemia, LDL goal less than 70- 4. Chronic diastolic heart failure- 5. Hypertension- 6. PVD-  Disposition: Follow up {follow up:[id number]} with Dr. Allyson Sabal or APP  Signed, Alver Sorrow, NP 02/07/2021, 3:21 PM [facility]

## 2021-02-09 DIAGNOSIS — F17210 Nicotine dependence, cigarettes, uncomplicated: Secondary | ICD-10-CM | POA: Diagnosis not present

## 2021-02-09 DIAGNOSIS — I6529 Occlusion and stenosis of unspecified carotid artery: Secondary | ICD-10-CM | POA: Diagnosis not present

## 2021-02-09 DIAGNOSIS — Z79899 Other long term (current) drug therapy: Secondary | ICD-10-CM | POA: Diagnosis not present

## 2021-02-09 DIAGNOSIS — E1122 Type 2 diabetes mellitus with diabetic chronic kidney disease: Secondary | ICD-10-CM | POA: Diagnosis not present

## 2021-02-09 DIAGNOSIS — Z7901 Long term (current) use of anticoagulants: Secondary | ICD-10-CM | POA: Diagnosis not present

## 2021-02-09 DIAGNOSIS — J810 Acute pulmonary edema: Secondary | ICD-10-CM | POA: Diagnosis not present

## 2021-02-09 DIAGNOSIS — E1151 Type 2 diabetes mellitus with diabetic peripheral angiopathy without gangrene: Secondary | ICD-10-CM | POA: Diagnosis not present

## 2021-02-09 DIAGNOSIS — I2510 Atherosclerotic heart disease of native coronary artery without angina pectoris: Secondary | ICD-10-CM | POA: Diagnosis not present

## 2021-02-09 DIAGNOSIS — Z955 Presence of coronary angioplasty implant and graft: Secondary | ICD-10-CM | POA: Diagnosis not present

## 2021-02-09 DIAGNOSIS — R270 Ataxia, unspecified: Secondary | ICD-10-CM | POA: Diagnosis not present

## 2021-02-09 DIAGNOSIS — J449 Chronic obstructive pulmonary disease, unspecified: Secondary | ICD-10-CM | POA: Diagnosis not present

## 2021-02-09 DIAGNOSIS — N183 Chronic kidney disease, stage 3 unspecified: Secondary | ICD-10-CM | POA: Diagnosis not present

## 2021-02-09 DIAGNOSIS — E785 Hyperlipidemia, unspecified: Secondary | ICD-10-CM | POA: Diagnosis not present

## 2021-02-09 DIAGNOSIS — I482 Chronic atrial fibrillation, unspecified: Secondary | ICD-10-CM | POA: Diagnosis not present

## 2021-02-09 DIAGNOSIS — M519 Unspecified thoracic, thoracolumbar and lumbosacral intervertebral disc disorder: Secondary | ICD-10-CM | POA: Diagnosis not present

## 2021-02-09 DIAGNOSIS — I129 Hypertensive chronic kidney disease with stage 1 through stage 4 chronic kidney disease, or unspecified chronic kidney disease: Secondary | ICD-10-CM | POA: Diagnosis not present

## 2021-02-09 DIAGNOSIS — E1136 Type 2 diabetes mellitus with diabetic cataract: Secondary | ICD-10-CM | POA: Diagnosis not present

## 2021-02-09 DIAGNOSIS — Z7902 Long term (current) use of antithrombotics/antiplatelets: Secondary | ICD-10-CM | POA: Diagnosis not present

## 2021-02-09 DIAGNOSIS — K219 Gastro-esophageal reflux disease without esophagitis: Secondary | ICD-10-CM | POA: Diagnosis not present

## 2021-03-05 DIAGNOSIS — J441 Chronic obstructive pulmonary disease with (acute) exacerbation: Secondary | ICD-10-CM | POA: Diagnosis not present

## 2021-03-05 DIAGNOSIS — R0600 Dyspnea, unspecified: Secondary | ICD-10-CM | POA: Diagnosis not present

## 2021-03-05 DIAGNOSIS — I2510 Atherosclerotic heart disease of native coronary artery without angina pectoris: Secondary | ICD-10-CM | POA: Diagnosis not present

## 2021-03-05 DIAGNOSIS — I480 Paroxysmal atrial fibrillation: Secondary | ICD-10-CM | POA: Diagnosis not present

## 2021-03-05 DIAGNOSIS — E785 Hyperlipidemia, unspecified: Secondary | ICD-10-CM | POA: Diagnosis not present

## 2021-03-05 DIAGNOSIS — I10 Essential (primary) hypertension: Secondary | ICD-10-CM | POA: Diagnosis not present

## 2021-03-05 DIAGNOSIS — E1159 Type 2 diabetes mellitus with other circulatory complications: Secondary | ICD-10-CM | POA: Diagnosis not present

## 2021-03-10 ENCOUNTER — Emergency Department (HOSPITAL_COMMUNITY): Payer: Medicare Other

## 2021-03-10 ENCOUNTER — Other Ambulatory Visit: Payer: Self-pay

## 2021-03-10 ENCOUNTER — Encounter (HOSPITAL_COMMUNITY): Payer: Self-pay | Admitting: Emergency Medicine

## 2021-03-10 ENCOUNTER — Inpatient Hospital Stay (HOSPITAL_COMMUNITY): Payer: Medicare Other

## 2021-03-10 ENCOUNTER — Inpatient Hospital Stay (HOSPITAL_COMMUNITY)
Admission: EM | Admit: 2021-03-10 | Discharge: 2021-03-12 | DRG: 291 | Disposition: A | Discharge status: Home / Self Care | Payer: Medicare Other | Attending: Internal Medicine | Admitting: Internal Medicine

## 2021-03-10 DIAGNOSIS — R270 Ataxia, unspecified: Secondary | ICD-10-CM | POA: Diagnosis not present

## 2021-03-10 DIAGNOSIS — Z9111 Patient's noncompliance with dietary regimen: Secondary | ICD-10-CM

## 2021-03-10 DIAGNOSIS — Z66 Do not resuscitate: Secondary | ICD-10-CM | POA: Diagnosis not present

## 2021-03-10 DIAGNOSIS — I2510 Atherosclerotic heart disease of native coronary artery without angina pectoris: Secondary | ICD-10-CM | POA: Diagnosis not present

## 2021-03-10 DIAGNOSIS — R0600 Dyspnea, unspecified: Secondary | ICD-10-CM | POA: Diagnosis not present

## 2021-03-10 DIAGNOSIS — J90 Pleural effusion, not elsewhere classified: Secondary | ICD-10-CM | POA: Diagnosis not present

## 2021-03-10 DIAGNOSIS — T502X5A Adverse effect of carbonic-anhydrase inhibitors, benzothiadiazides and other diuretics, initial encounter: Secondary | ICD-10-CM | POA: Diagnosis present

## 2021-03-10 DIAGNOSIS — E1159 Type 2 diabetes mellitus with other circulatory complications: Secondary | ICD-10-CM

## 2021-03-10 DIAGNOSIS — I5031 Acute diastolic (congestive) heart failure: Secondary | ICD-10-CM | POA: Diagnosis not present

## 2021-03-10 DIAGNOSIS — J449 Chronic obstructive pulmonary disease, unspecified: Secondary | ICD-10-CM | POA: Diagnosis present

## 2021-03-10 DIAGNOSIS — I480 Paroxysmal atrial fibrillation: Secondary | ICD-10-CM | POA: Diagnosis not present

## 2021-03-10 DIAGNOSIS — K219 Gastro-esophageal reflux disease without esophagitis: Secondary | ICD-10-CM | POA: Diagnosis not present

## 2021-03-10 DIAGNOSIS — I5033 Acute on chronic diastolic (congestive) heart failure: Secondary | ICD-10-CM | POA: Diagnosis not present

## 2021-03-10 DIAGNOSIS — Z884 Allergy status to anesthetic agent status: Secondary | ICD-10-CM

## 2021-03-10 DIAGNOSIS — Z9114 Patient's other noncompliance with medication regimen: Secondary | ICD-10-CM

## 2021-03-10 DIAGNOSIS — I517 Cardiomegaly: Secondary | ICD-10-CM | POA: Diagnosis not present

## 2021-03-10 DIAGNOSIS — E785 Hyperlipidemia, unspecified: Secondary | ICD-10-CM | POA: Diagnosis present

## 2021-03-10 DIAGNOSIS — I509 Heart failure, unspecified: Secondary | ICD-10-CM | POA: Diagnosis not present

## 2021-03-10 DIAGNOSIS — I130 Hypertensive heart and chronic kidney disease with heart failure and stage 1 through stage 4 chronic kidney disease, or unspecified chronic kidney disease: Principal | ICD-10-CM | POA: Diagnosis present

## 2021-03-10 DIAGNOSIS — I714 Abdominal aortic aneurysm, without rupture: Secondary | ICD-10-CM | POA: Diagnosis present

## 2021-03-10 DIAGNOSIS — N183 Chronic kidney disease, stage 3 unspecified: Secondary | ICD-10-CM | POA: Diagnosis not present

## 2021-03-10 DIAGNOSIS — Z7984 Long term (current) use of oral hypoglycemic drugs: Secondary | ICD-10-CM

## 2021-03-10 DIAGNOSIS — E1136 Type 2 diabetes mellitus with diabetic cataract: Secondary | ICD-10-CM | POA: Diagnosis not present

## 2021-03-10 DIAGNOSIS — F17210 Nicotine dependence, cigarettes, uncomplicated: Secondary | ICD-10-CM | POA: Diagnosis present

## 2021-03-10 DIAGNOSIS — F39 Unspecified mood [affective] disorder: Secondary | ICD-10-CM | POA: Diagnosis not present

## 2021-03-10 DIAGNOSIS — E1151 Type 2 diabetes mellitus with diabetic peripheral angiopathy without gangrene: Secondary | ICD-10-CM

## 2021-03-10 DIAGNOSIS — R0689 Other abnormalities of breathing: Secondary | ICD-10-CM | POA: Diagnosis not present

## 2021-03-10 DIAGNOSIS — I129 Hypertensive chronic kidney disease with stage 1 through stage 4 chronic kidney disease, or unspecified chronic kidney disease: Secondary | ICD-10-CM | POA: Diagnosis not present

## 2021-03-10 DIAGNOSIS — I482 Chronic atrial fibrillation, unspecified: Secondary | ICD-10-CM | POA: Diagnosis not present

## 2021-03-10 DIAGNOSIS — E119 Type 2 diabetes mellitus without complications: Secondary | ICD-10-CM

## 2021-03-10 DIAGNOSIS — Z7901 Long term (current) use of anticoagulants: Secondary | ICD-10-CM

## 2021-03-10 DIAGNOSIS — I6529 Occlusion and stenosis of unspecified carotid artery: Secondary | ICD-10-CM | POA: Diagnosis not present

## 2021-03-10 DIAGNOSIS — N1832 Chronic kidney disease, stage 3b: Secondary | ICD-10-CM

## 2021-03-10 DIAGNOSIS — Z888 Allergy status to other drugs, medicaments and biological substances status: Secondary | ICD-10-CM

## 2021-03-10 DIAGNOSIS — E876 Hypokalemia: Secondary | ICD-10-CM | POA: Diagnosis not present

## 2021-03-10 DIAGNOSIS — Z20822 Contact with and (suspected) exposure to covid-19: Secondary | ICD-10-CM | POA: Diagnosis not present

## 2021-03-10 DIAGNOSIS — Z79899 Other long term (current) drug therapy: Secondary | ICD-10-CM

## 2021-03-10 DIAGNOSIS — J9601 Acute respiratory failure with hypoxia: Secondary | ICD-10-CM | POA: Diagnosis not present

## 2021-03-10 DIAGNOSIS — E1122 Type 2 diabetes mellitus with diabetic chronic kidney disease: Secondary | ICD-10-CM | POA: Diagnosis present

## 2021-03-10 DIAGNOSIS — R0902 Hypoxemia: Secondary | ICD-10-CM | POA: Diagnosis not present

## 2021-03-10 DIAGNOSIS — Z794 Long term (current) use of insulin: Secondary | ICD-10-CM

## 2021-03-10 DIAGNOSIS — R0602 Shortness of breath: Secondary | ICD-10-CM | POA: Diagnosis not present

## 2021-03-10 DIAGNOSIS — Z8249 Family history of ischemic heart disease and other diseases of the circulatory system: Secondary | ICD-10-CM

## 2021-03-10 DIAGNOSIS — Z833 Family history of diabetes mellitus: Secondary | ICD-10-CM

## 2021-03-10 DIAGNOSIS — Z885 Allergy status to narcotic agent status: Secondary | ICD-10-CM

## 2021-03-10 DIAGNOSIS — N1831 Chronic kidney disease, stage 3a: Secondary | ICD-10-CM | POA: Diagnosis present

## 2021-03-10 DIAGNOSIS — Z83438 Family history of other disorder of lipoprotein metabolism and other lipidemia: Secondary | ICD-10-CM

## 2021-03-10 DIAGNOSIS — D631 Anemia in chronic kidney disease: Secondary | ICD-10-CM | POA: Diagnosis present

## 2021-03-10 DIAGNOSIS — E782 Mixed hyperlipidemia: Secondary | ICD-10-CM | POA: Diagnosis present

## 2021-03-10 DIAGNOSIS — D66 Hereditary factor VIII deficiency: Secondary | ICD-10-CM | POA: Diagnosis not present

## 2021-03-10 DIAGNOSIS — I10 Essential (primary) hypertension: Secondary | ICD-10-CM

## 2021-03-10 DIAGNOSIS — Z7902 Long term (current) use of antithrombotics/antiplatelets: Secondary | ICD-10-CM

## 2021-03-10 DIAGNOSIS — Z955 Presence of coronary angioplasty implant and graft: Secondary | ICD-10-CM | POA: Diagnosis not present

## 2021-03-10 DIAGNOSIS — I110 Hypertensive heart disease with heart failure: Secondary | ICD-10-CM | POA: Diagnosis not present

## 2021-03-10 DIAGNOSIS — J810 Acute pulmonary edema: Secondary | ICD-10-CM | POA: Diagnosis not present

## 2021-03-10 DIAGNOSIS — M519 Unspecified thoracic, thoracolumbar and lumbosacral intervertebral disc disorder: Secondary | ICD-10-CM | POA: Diagnosis not present

## 2021-03-10 LAB — CBC WITH DIFFERENTIAL/PLATELET
Abs Immature Granulocytes: 0.07 10*3/uL (ref 0.00–0.07)
Basophils Absolute: 0 10*3/uL (ref 0.0–0.1)
Basophils Relative: 0 %
Eosinophils Absolute: 0.2 10*3/uL (ref 0.0–0.5)
Eosinophils Relative: 2 %
HCT: 38.8 % — ABNORMAL LOW (ref 39.0–52.0)
Hemoglobin: 12.9 g/dL — ABNORMAL LOW (ref 13.0–17.0)
Immature Granulocytes: 1 %
Lymphocytes Relative: 9 %
Lymphs Abs: 1.1 10*3/uL (ref 0.7–4.0)
MCH: 29.2 pg (ref 26.0–34.0)
MCHC: 33.2 g/dL (ref 30.0–36.0)
MCV: 87.8 fL (ref 80.0–100.0)
Monocytes Absolute: 1 10*3/uL (ref 0.1–1.0)
Monocytes Relative: 8 %
Neutro Abs: 9.6 10*3/uL — ABNORMAL HIGH (ref 1.7–7.7)
Neutrophils Relative %: 80 %
Platelets: 288 10*3/uL (ref 150–400)
RBC: 4.42 MIL/uL (ref 4.22–5.81)
RDW: 14.5 % (ref 11.5–15.5)
WBC: 12 10*3/uL — ABNORMAL HIGH (ref 4.0–10.5)
nRBC: 0 % (ref 0.0–0.2)

## 2021-03-10 LAB — COMPREHENSIVE METABOLIC PANEL
ALT: 19 U/L (ref 0–44)
AST: 18 U/L (ref 15–41)
Albumin: 3.4 g/dL — ABNORMAL LOW (ref 3.5–5.0)
Alkaline Phosphatase: 84 U/L (ref 38–126)
Anion gap: 8 (ref 5–15)
BUN: 15 mg/dL (ref 8–23)
CO2: 24 mmol/L (ref 22–32)
Calcium: 9.1 mg/dL (ref 8.9–10.3)
Chloride: 105 mmol/L (ref 98–111)
Creatinine, Ser: 1.27 mg/dL — ABNORMAL HIGH (ref 0.61–1.24)
GFR, Estimated: 58 mL/min — ABNORMAL LOW (ref >60)
Glucose, Bld: 143 mg/dL — ABNORMAL HIGH (ref 70–99)
Potassium: 3.6 mmol/L (ref 3.5–5.1)
Sodium: 137 mmol/L (ref 135–145)
Total Bilirubin: 0.7 mg/dL (ref 0.3–1.2)
Total Protein: 6.5 g/dL (ref 6.5–8.1)

## 2021-03-10 LAB — GLUCOSE, CAPILLARY
Glucose-Capillary: 151 mg/dL — ABNORMAL HIGH (ref 70–99)
Glucose-Capillary: 163 mg/dL — ABNORMAL HIGH (ref 70–99)

## 2021-03-10 LAB — RESP PANEL BY RT-PCR (FLU A&B, COVID) ARPGX2
Influenza A by PCR: NEGATIVE
Influenza B by PCR: NEGATIVE
SARS Coronavirus 2 by RT PCR: NEGATIVE

## 2021-03-10 LAB — ECHOCARDIOGRAM COMPLETE
AR max vel: 2.47 cm2
AV Area VTI: 2.24 cm2
AV Area mean vel: 2.33 cm2
AV Mean grad: 4 mmHg
AV Peak grad: 7.6 mmHg
Ao pk vel: 1.38 m/s
Area-P 1/2: 2.69 cm2

## 2021-03-10 LAB — TROPONIN I (HIGH SENSITIVITY)
Troponin I (High Sensitivity): 17 ng/L (ref <18)
Troponin I (High Sensitivity): 15 ng/L (ref <18)

## 2021-03-10 LAB — CBG MONITORING, ED
Glucose-Capillary: 151 mg/dL — ABNORMAL HIGH (ref 70–99)
Glucose-Capillary: 136 mg/dL — ABNORMAL HIGH (ref 70–99)

## 2021-03-10 LAB — BRAIN NATRIURETIC PEPTIDE: B Natriuretic Peptide: 486.9 pg/mL — ABNORMAL HIGH (ref 0.0–100.0)

## 2021-03-10 MED ORDER — ACETAMINOPHEN 325 MG PO TABS: 650.0000 mg | ORAL_TABLET | Freq: Four times a day (QID) | ORAL | Status: DC | PRN

## 2021-03-10 MED ORDER — ATORVASTATIN CALCIUM 40 MG PO TABS
40.0000 mg | ORAL_TABLET | Freq: Every day | ORAL | Status: DC
  Administered 2021-03-10 – 2021-03-11 (×2): 40 mg via ORAL
  Filled 2021-03-10 (×2): qty 1

## 2021-03-10 MED ORDER — ISOSORBIDE MONONITRATE ER 60 MG PO TB24
60.0000 mg | ORAL_TABLET | Freq: Every day | ORAL | Status: DC
  Administered 2021-03-10 – 2021-03-12 (×3): 60 mg via ORAL
  Filled 2021-03-10: qty 1
  Filled 2021-03-10: qty 2
  Filled 2021-03-10: qty 1

## 2021-03-10 MED ORDER — FUROSEMIDE 10 MG/ML IJ SOLN
40.0000 mg | Freq: Two times a day (BID) | INTRAMUSCULAR | Status: DC
  Administered 2021-03-10 – 2021-03-12 (×5): 40 mg via INTRAVENOUS
  Filled 2021-03-10 (×5): qty 4

## 2021-03-10 MED ORDER — PERFLUTREN LIPID MICROSPHERE
1.0000 mL | INTRAVENOUS | Status: AC | PRN
  Administered 2021-03-10: 2 mL via INTRAVENOUS
  Filled 2021-03-10: qty 10

## 2021-03-10 MED ORDER — APIXABAN 5 MG PO TABS
5.0000 mg | ORAL_TABLET | Freq: Two times a day (BID) | ORAL | Status: DC
  Administered 2021-03-10 – 2021-03-12 (×5): 5 mg via ORAL
  Filled 2021-03-10 (×5): qty 1

## 2021-03-10 MED ORDER — IPRATROPIUM-ALBUTEROL 0.5-2.5 (3) MG/3ML IN SOLN: 3.0000 mL | Freq: Four times a day (QID) | RESPIRATORY_TRACT | Status: DC | PRN

## 2021-03-10 MED ORDER — INSULIN ASPART 100 UNIT/ML IJ SOLN
0.0000 [IU] | Freq: Every day | INTRAMUSCULAR | Status: DC
  Administered 2021-03-11: 2 [IU] via SUBCUTANEOUS

## 2021-03-10 MED ORDER — AMLODIPINE BESYLATE 10 MG PO TABS
10.0000 mg | ORAL_TABLET | Freq: Every day | ORAL | Status: DC
  Administered 2021-03-10 – 2021-03-12 (×3): 10 mg via ORAL
  Filled 2021-03-10 (×2): qty 1
  Filled 2021-03-10: qty 2

## 2021-03-10 MED ORDER — LOSARTAN POTASSIUM 50 MG PO TABS
100.0000 mg | ORAL_TABLET | Freq: Every day | ORAL | Status: DC
  Administered 2021-03-10 – 2021-03-11 (×2): 100 mg via ORAL
  Filled 2021-03-10 (×2): qty 2

## 2021-03-10 MED ORDER — INSULIN ASPART 100 UNIT/ML IJ SOLN
0.0000 [IU] | Freq: Three times a day (TID) | INTRAMUSCULAR | Status: DC
  Administered 2021-03-10: 2 [IU] via SUBCUTANEOUS
  Administered 2021-03-11: 1 [IU] via SUBCUTANEOUS
  Administered 2021-03-11: 2 [IU] via SUBCUTANEOUS
  Administered 2021-03-12: 1 [IU] via SUBCUTANEOUS
  Administered 2021-03-12: 2 [IU] via SUBCUTANEOUS

## 2021-03-10 MED ORDER — PANTOPRAZOLE SODIUM 40 MG PO TBEC
40.0000 mg | DELAYED_RELEASE_TABLET | Freq: Every day | ORAL | Status: DC
  Administered 2021-03-10 – 2021-03-12 (×3): 40 mg via ORAL
  Filled 2021-03-10 (×3): qty 1

## 2021-03-10 MED ORDER — CLOPIDOGREL BISULFATE 75 MG PO TABS
75.0000 mg | ORAL_TABLET | Freq: Every day | ORAL | Status: DC
  Administered 2021-03-10 – 2021-03-12 (×3): 75 mg via ORAL
  Filled 2021-03-10 (×3): qty 1

## 2021-03-10 MED ORDER — FUROSEMIDE 10 MG/ML IJ SOLN
40.0000 mg | INTRAMUSCULAR | Status: AC
  Administered 2021-03-10: 40 mg via INTRAVENOUS
  Filled 2021-03-10: qty 4

## 2021-03-10 MED ORDER — ESCITALOPRAM OXALATE 10 MG PO TABS
20.0000 mg | ORAL_TABLET | Freq: Every day | ORAL | Status: DC
  Administered 2021-03-10 – 2021-03-12 (×3): 20 mg via ORAL
  Filled 2021-03-10 (×3): qty 2

## 2021-03-10 MED ORDER — METOPROLOL TARTRATE 25 MG PO TABS
25.0000 mg | ORAL_TABLET | Freq: Two times a day (BID) | ORAL | Status: DC
  Administered 2021-03-10 – 2021-03-12 (×5): 25 mg via ORAL
  Filled 2021-03-10 (×5): qty 1

## 2021-03-10 MED ORDER — ACETAMINOPHEN 650 MG RE SUPP: 650.0000 mg | Freq: Four times a day (QID) | RECTAL | Status: DC | PRN

## 2021-03-10 NOTE — ED Notes (Signed)
De Hollingshead [phone] daughter requesting an update

## 2021-03-10 NOTE — Progress Notes (Signed)
PROGRESS NOTE    Albert Powell  PNT:[id number] DOB: [birth year] DOA: 03/10/2021 PCP: [name], MD    Chief Complaint  Patient presents with   Respiratory Distress    CPAP    Brief Narrative:   This is a no charge note as patient was seen and admitted earlier today by Dr. Ulyess Blossom, patient was seen and examined, chart, imaging and labs were reviewed.  Albert Powell is a 80 y.o. male with medical history significant of paroxysmal A. fib on Eliquis with history of noncompliance, chronic diastolic CHF, CAD status post PCI with stenting, carotid artery stenosis status post left CEA, COPD, tobacco use, GERD, type II diabetes, hypertension, hyperlipidemia, PVD, CKD stage IIIa.  He was admitted in April 2022 for hypertensive emergency, flash pulmonary edema, A. fib with RVR due to medication noncompliance.  He now presents to the ED via EMS for evaluation of respiratory distress.  On EMS arrival, he was satting in the 70s on room air and was placed on CPAP.  Blood pressure 176/82 on arrival.  Transitioned to BiPAP which he tolerated well.  Labs showing mild leukocytosis (WBC 12.0).  BNP elevated at 486.  Initial high-sensitivity troponin negative, repeat pending.  EKG without acute ischemic changes.  COVID and influenza PCR negative.  Chest x-ray showing mild cardiogenic failure, small right pleural effusion. Patient was given IV Lasix 40 mg.   Patient reports dyspnea on exertion, orthopnea, and bilateral lower extremity edema.  Symptoms started a few weeks ago and have been getting progressively worse.  Last night he could barely catch his breath.  Reports cough but no fevers.  Reports taking Lasix 20 mg daily and has not missed any doses.  States he does not drink a lot of fluid and watches his salt intake.  States his blood pressure normally does not run high and he has not missed any doses of his blood pressure medications although he does not know the names of the medications or  how many medications he takes.  No other complaints.   Assessment & Plan:   Principal Problem:   CHF exacerbation ([name]) Active Problems:   Type 2 diabetes mellitus ([name])   Hyperlipidemia   Acute hypoxemic respiratory failure ([name])   Uncontrolled hypertension  Acute hypoxemic respiratory failure secondary to acute on chronic diastolic CHF Presented with complaints of progressively worsening dyspnea on exertion, orthopnea, and bilateral lower extremity edema for a few weeks.  Potential volume overload including elevated BNP at 486 on admission, and chest x-ray with mild cardiogenic failure, has elevated GFD, and worsening lower extremity edema. -Required BiPAP initially, he is currently on 3 L nasal cannula I -  Echo done in July 2021 showing normal systolic function and grade 1 diastolic dysfunction. -IV Lasix 40 mg twice daily.  Repeat echocardiogram.  Monitor intake and output, daily weights, low-sodium diet with fluid restriction.  Continue supplemental oxygen, wean as tolerated.   Uncontrolled hypertension History of medication noncompliance.  Blood pressure elevated with systolic in the 170s on presentations -Continue IV Lasix.  Resume home amlodipine, Imdur, losartan, and metoprolol.  Monitor blood pressure closely.   Paroxysmal A. fib Currently in sinus rhythm. -Continue metoprolol and Eliquis   CAD status post PCI with stenting -Continue Plavix, metoprolol, and Lipitor   COPD No signs of acute exacerbation.  He is not on any inhalers at home. -DuoNeb as needed   Non-insulin-dependent type 2 diabetes A1c 7.5 on 01/02/2021. -Sliding scale insulin sensitive ACHS.   Hyperlipidemia -Continue  Lipitor   CKD stage IIIa Creatinine at baseline. -Continue to monitor renal function   GERD -Continue Protonix   Mood disorder -Continue Lexapro     DVT prophylaxis: Eliquis Code Status: DNR Family Communication: None at bedside Disposition:   Status is:  Inpatient  Remains inpatient appropriate because:IV treatments appropriate due to intensity of illness or inability to take PO  Dispo: The patient is from: Home              Anticipated d/c is to: Home              Patient currently is not medically stable to d/c.   Difficult to place patient No       Consultants:  None   Subjective:   Denies any chest pain, complains of some dyspnea, but reports it did improve. Objective: Vitals:   03/10/21 1100 03/10/21 1130 03/10/21 1145 03/10/21 1200  BP: (!) 170/68 (!) 155/51 (!) 142/52 (!) 143/82  Pulse: 64 66 61 65  Resp: (!) 21 (!) 21 (!) 23 (!) 23  Temp:    98 F (36.7 C)  TempSrc:      SpO2: 93% 96% 94% 94%    Intake/Output Summary (Last 24 hours) at 03/10/2021 1405 Last data filed at 03/10/2021 1200 Gross per 24 hour  Intake --  Output 1400 ml  Net -1400 ml   There were no vitals filed for this visit.  Examination:  General exam: Appears calm and comfortable  Respiratory system: Mild crackles at the bases. Respiratory effort normal. Cardiovascular system: S1 & S2 heard, RRR.  Has mild JVD, +1 edema bilaterally  gastrointestinal system: Abdomen is nondistended, soft and nontender. No organomegaly or masses felt. Normal bowel sounds heard. Central nervous system: Alert and oriented. No focal neurological deficits. Extremities: Symmetric 5 x 5 power. Skin: No rashes, lesions or ulcers Psychiatry: Judgement and insight appear normal. Mood & affect appropriate.     Data Reviewed: I have personally reviewed following labs and imaging studies  CBC: Recent Labs  Lab 03/10/21 0307  WBC 12.0*  NEUTROABS 9.6*  HGB 12.9*  HCT 38.8*  MCV 87.8  PLT 288    Basic Metabolic Panel: Recent Labs  Lab 03/10/21 0307  NA 137  K 3.6  CL 105  CO2 24  GLUCOSE 143*  BUN 15  CREATININE 1.27*  CALCIUM 9.1    GFR: CrCl cannot be calculated (Unknown ideal weight.).  Liver Function Tests: Recent Labs  Lab 03/10/21 0307   AST 18  ALT 19  ALKPHOS 84  BILITOT 0.7  PROT 6.5  ALBUMIN 3.4*    CBG: Recent Labs  Lab 03/10/21 0908 03/10/21 1219  GLUCAP 151* 136*     Recent Results (from the past 240 hour(s))  Resp Panel by RT-PCR (Flu A&B, Covid) Nasopharyngeal Swab     Status: None   Collection Time: 03/10/21  3:08 AM   Specimen: Nasopharyngeal Swab; Nasopharyngeal(NP) swabs in vial transport medium  Result Value Ref Range Status   SARS Coronavirus 2 by RT PCR NEGATIVE NEGATIVE Final    Comment: (NOTE) SARS-CoV-2 target nucleic acids are NOT DETECTED.  The SARS-CoV-2 RNA is generally detectable in upper respiratory specimens during the acute phase of infection. The lowest concentration of SARS-CoV-2 viral copies this assay can detect is 138 copies/mL. A negative result does not preclude SARS-Cov-2 infection and should not be used as the sole basis for treatment or other patient management decisions. A negative result may occur with  improper  specimen collection/handling, submission of specimen other than nasopharyngeal swab, presence of viral mutation(s) within the areas targeted by this assay, and inadequate number of viral copies(<138 copies/mL). A negative result must be combined with clinical observations, patient history, and epidemiological information. The expected result is Negative.  Fact Sheet for Patients:  BloggerCourse.comhttps://www.fda.gov/media/152166/download  Fact Sheet for Healthcare Providers:  SeriousBroker.ithttps://www.fda.gov/media/152162/download  This test is no t yet approved or cleared by the [place] FDA and  has been authorized for detection and/or diagnosis of SARS-CoV-2 by FDA under an Emergency Use Authorization (EUA). This EUA will remain  in effect (meaning this test can be used) for the duration of the COVID-19 declaration under Section 564(b)(1) of the Act, 21 U.S.C.section 360bbb-3(b)(1), unless the authorization is terminated  or revoked sooner.       Influenza A by PCR  NEGATIVE NEGATIVE Final   Influenza B by PCR NEGATIVE NEGATIVE Final    Comment: (NOTE) The Xpert Xpress SARS-CoV-2/FLU/RSV plus assay is intended as an aid in the diagnosis of influenza from Nasopharyngeal swab specimens and should not be used as a sole basis for treatment. Nasal washings and aspirates are unacceptable for Xpert Xpress SARS-CoV-2/FLU/RSV testing.  Fact Sheet for Patients: BloggerCourse.comhttps://www.fda.gov/media/152166/download  Fact Sheet for Healthcare Providers: SeriousBroker.ithttps://www.fda.gov/media/152162/download  This test is not yet approved or cleared by the [place] FDA and has been authorized for detection and/or diagnosis of SARS-CoV-2 by FDA under an Emergency Use Authorization (EUA). This EUA will remain in effect (meaning this test can be used) for the duration of the COVID-19 declaration under Section 564(b)(1) of the Act, 21 U.S.C. section 360bbb-3(b)(1), unless the authorization is terminated or revoked.  Performed at [facility], [address] [place], [place] [address]          Radiology Studies: DG Chest Port 1 View  Result Date: 03/10/2021 CLINICAL DATA:  Dyspnea, EXAM: PORTABLE CHEST 1 VIEW COMPARISON:  01/01/2021 FINDINGS: Mild to moderate bilateral perihilar interstitial pulmonary infiltrate is present most in keeping with mild cardiogenic failure. Small right pleural effusion is present contributing to right basilar opacification. No pneumothorax. Mild cardiomegaly is stable. Dorsal column stimulator leads noted. IMPRESSION: Mild cardiogenic failure. Small right pleural effusion. Stable cardiomegaly. Electronically Signed   By: Helyn NumbersAshesh  Parikh MD   On: 03/10/2021 05:27   ECHOCARDIOGRAM COMPLETE  Result Date: 03/10/2021    ECHOCARDIOGRAM REPORT   Patient Name:   Albert FabianJAMES W Sedgwick Date of Exam: 03/10/2021 Medical Rec #:  [id number]        Height:       69.0 in Accession #:    [id number]       Weight:       224.2 lb Date of Birth:  1940-12-23        BSA:           2.169 m Patient Age:    79 years         BP:           165/95 mmHg Patient Gender: M                HR:           66 bpm. Exam Location:  Inpatient Procedure: 2D Echo, Cardiac Doppler, Color Doppler and Intracardiac            Opacification Agent Indications:    I50.31 Acute diastolic (congestive) heart failure  History:        Patient has prior history of Echocardiogram examinations, most  recent 03/30/2020. Shortness of breath for the last two days on                 3L room O2.  Sonographer:    Roosvelt Maser RDCS Referring Phys: [id number] VASUNDHRA RATHORE IMPRESSIONS  1. Left ventricular ejection fraction, by estimation, is 60 to 65%. The left ventricle has normal function. The left ventricle has no regional wall motion abnormalities. There is mild concentric left ventricular hypertrophy. Left ventricular diastolic parameters are consistent with Grade I diastolic dysfunction (impaired relaxation).  2. Right ventricular systolic function is normal. The right ventricular size is normal. Tricuspid regurgitation signal is inadequate for assessing PA pressure.  3. A small pericardial effusion is present. The pericardial effusion is circumferential. There is no evidence of cardiac tamponade.  4. The mitral valve is grossly normal. No evidence of mitral valve regurgitation. No evidence of mitral stenosis.  5. The aortic valve is tricuspid. There is mild calcification of the aortic valve. Aortic valve regurgitation is not visualized. Mild aortic valve sclerosis is present, with no evidence of aortic valve stenosis.  6. The inferior vena cava is normal in size with greater than 50% respiratory variability, suggesting right atrial pressure of 3 mmHg. Comparison(s): No significant change from prior study. FINDINGS  Left Ventricle: Left ventricular ejection fraction, by estimation, is 60 to 65%. The left ventricle has normal function. The left ventricle has no regional wall motion abnormalities. Definity  contrast agent was given IV to delineate the left ventricular  endocardial borders. The left ventricular internal cavity size was normal in size. There is mild concentric left ventricular hypertrophy. Left ventricular diastolic parameters are consistent with Grade I diastolic dysfunction (impaired relaxation). Right Ventricle: The right ventricular size is normal. No increase in right ventricular wall thickness. Right ventricular systolic function is normal. Tricuspid regurgitation signal is inadequate for assessing PA pressure. Left Atrium: Left atrial size was normal in size. Right Atrium: Right atrial size was normal in size. Pericardium: A small pericardial effusion is present. The pericardial effusion is circumferential. There is no evidence of cardiac tamponade. Mitral Valve: The mitral valve is grossly normal. Mild to moderate mitral annular calcification. No evidence of mitral valve regurgitation. No evidence of mitral valve stenosis. Tricuspid Valve: The tricuspid valve is grossly normal. Tricuspid valve regurgitation is not demonstrated. No evidence of tricuspid stenosis. Aortic Valve: The aortic valve is tricuspid. There is mild calcification of the aortic valve. Aortic valve regurgitation is not visualized. Mild aortic valve sclerosis is present, with no evidence of aortic valve stenosis. Aortic valve mean gradient measures 4.0 mmHg. Aortic valve peak gradient measures 7.6 mmHg. Aortic valve area, by VTI measures 2.24 cm. Pulmonic Valve: The pulmonic valve was grossly normal. Pulmonic valve regurgitation is not visualized. No evidence of pulmonic stenosis. Aorta: The aortic root is normal in size and structure. Venous: The inferior vena cava is normal in size with greater than 50% respiratory variability, suggesting right atrial pressure of 3 mmHg. IAS/Shunts: The atrial septum is grossly normal.  LEFT VENTRICLE PLAX 2D LVIDd:         4.70 cm  Diastology LV PW:         1.30 cm  LV e' medial:    6.09  cm/s LV IVS:        1.30 cm  LV E/e' medial:  14.4 LVOT diam:     2.10 cm  LV e' lateral:   9.25 cm/s LV SV:  65       LV E/e' lateral: 9.5 LV SV Index:   30 LVOT Area:     3.46 cm  RIGHT VENTRICLE RV Basal diam:  4.20 cm RV Mid diam:    3.20 cm LEFT ATRIUM           Index       RIGHT ATRIUM           Index LA diam:      4.50 cm 2.07 cm/m  RA Area:     19.00 cm LA Vol (A2C): 56.5 ml 26.05 ml/m RA Volume:   47.20 ml  21.76 ml/m LA Vol (A4C): 59.8 ml 27.57 ml/m  AORTIC VALVE AV Area (Vmax):    2.47 cm AV Area (Vmean):   2.33 cm AV Area (VTI):     2.24 cm AV Vmax:           138.00 cm/s AV Vmean:          91.800 cm/s AV VTI:            0.292 m AV Peak Grad:      7.6 mmHg AV Mean Grad:      4.0 mmHg LVOT Vmax:         98.50 cm/s LVOT Vmean:        61.800 cm/s LVOT VTI:          0.189 m LVOT/AV VTI ratio: 0.65  AORTA Ao Root diam: 3.30 cm MITRAL VALVE MV Area (PHT): 2.69 cm    SHUNTS MV Decel Time: 282 msec    Systemic VTI:  0.19 m MV E velocity: 88.00 cm/s  Systemic Diam: 2.10 cm MV A velocity: 89.10 cm/s MV E/A ratio:  0.99 Lennie Odor MD Electronically signed by Lennie Odor MD Signature Date/Time: 03/10/2021/11:26:37 AM    Final         Scheduled Meds:  amLODipine  10 mg Oral Daily   apixaban  5 mg Oral BID   atorvastatin  40 mg Oral QHS   clopidogrel  75 mg Oral Daily   escitalopram  20 mg Oral Daily   furosemide  40 mg Intravenous BID   insulin aspart  0-5 Units Subcutaneous QHS   insulin aspart  0-9 Units Subcutaneous TID WC   isosorbide mononitrate  60 mg Oral Daily   losartan  100 mg Oral Daily   metoprolol tartrate  25 mg Oral BID   pantoprazole  40 mg Oral Daily   Continuous Infusions:   LOS: 0 days       Huey Bienenstock, MD [facility]   To contact the attending provider between 7A-7P or the covering provider during after hours 7P-7A, please log into the web site [email or web address] and access using universal [place] password for that web site. If you do  not have the password, please call the hospital operator.  03/10/2021, 2:05 PM

## 2021-03-10 NOTE — ED Triage Notes (Signed)
Patient arrived with EMS from home reports worsening SOB / chest tightness this week , he received 1 NTG sl and 1 dose of Duoneb nebulizer by EMS prior to arrival .

## 2021-03-10 NOTE — ED Notes (Signed)
Condom cath applied

## 2021-03-10 NOTE — Progress Notes (Addendum)
  Echocardiogram 2D Echocardiogram with contrast has been performed.  Roosvelt Maser F 03/10/2021, 10:44 AM

## 2021-03-10 NOTE — ED Provider Notes (Addendum)
[facility] EMERGENCY DEPARTMENT Provider Note   CSN: [id number] Arrival date & time: 03/10/21  0301     History Chief Complaint  Patient presents with   Respiratory Distress    CPAP    Albert Powell is a 80 y.o. male.  The history is provided by the patient and medical records.   80 y.o. M with hx of AFIB on Eliquis (hx of non-compliance with this), CAD, COPD, GERD, DM, HLP, HTN, presenting to the ED for SOB.  Symptoms worsening over the past 3 days.  Evaluated by home health yesterday and encouraged to come to ED then but refused.  On EMS arrival tonight, saturations in the 70's.  Does report recent cough but denies fever or sick contacts.  No hemoptysis.  Hypertensive on arrival.  Past Medical History:  Diagnosis Date   Atrial fibrillation ([name]) 03/30/2020   Bradycardia, sinus 02/18/2013   CAD (coronary artery disease)    stent to RCA 2003 also had 40% lesion at that time; NUCLEAR STRESS TEST, 01/16/2010 - normal  now with cath 80-90% stenosis in LAD, will try medical therapy if no improvement PCI   Carotid artery disease ([name])    Cataract    Claudication ([name])    LEA DUPLEX, 07/07/2008 - Normal   COPD (chronic obstructive pulmonary disease) ([name])    DDD (degenerative disc disease) 2008   Diabetes mellitus    GERD (gastroesophageal reflux disease)    H/O hiatal hernia    History of colonoscopy 2004   finding of tics and AMV only no polpys   Hyperlipidemia 02/05/2013   Hypertension    Onychomycosis    Rosacea    Tobacco abuse 02/05/2013    Patient Active Problem List   Diagnosis Date Noted   Hypertensive emergency 01/01/2021   ACS (acute coronary syndrome) ([name])    AKI (acute kidney injury) ([name])    Atrial fibrillation with RVR ([name]) 03/30/2020   Contusion of right hand 11/12/2018   Primary osteoarthritis of both first carpometacarpal joints 11/12/2018   Pain in joint of right shoulder 10/05/2018   Pain in right hand 10/05/2018   Hematoma  03/30/2018   Degeneration of lumbar intervertebral disc 12/21/2017   Lumbar radiculopathy 12/21/2017   Vertigo 04/09/2017   Hypogonadism in male 12/07/2014   Screening for prostate cancer 12/07/2014   Claudication of right lower extremity ([name]) 10/20/2013   Claudication in peripheral vascular disease- Rt leg 09/20/2013   Obesity (BMI 30-39.9)- negative sleep study in the past 09/20/2013   Occlusion and stenosis of carotid artery without mention of cerebral infarction 05/23/2013   Peripheral arterial disease ([name]) 05/18/2013   Carotid artery disease ([name]) 04/14/2013   Bradycardia, sinus 02/18/2013   Hyperlipidemia 02/05/2013   Tobacco abuse 02/05/2013   Chest pain, unstable angina, negative MI 02/04/2013   Type 2 diabetes mellitus ([name]) 02/04/2013   CAD hx of 2 stents to RCA in 2003,  80-90% stenosis of LAD/CFX June 2014- medical Rx 02/04/2013   Benign essential hypertension 07/10/2011   Incisional hernia 05/22/2011    Past Surgical History:  Procedure Laterality Date   ABDOMINAL AORTOGRAM W/LOWER EXTREMITY N/A 03/30/2018   Procedure: ABDOMINAL AORTOGRAM W/LOWER EXTREMITY;  Surgeon: Nada Libman, MD;  Location: [facility] INVASIVE CV LAB;  Service: Cardiovascular;  Laterality: N/A;   ABDOMINAL AORTOGRAM W/LOWER EXTREMITY Bilateral 08/27/2020   Procedure: ABDOMINAL AORTOGRAM W/LOWER EXTREMITY;  Surgeon: [name], MD;  Location: [facility] INVASIVE CV LAB;  Service: Cardiovascular;  Laterality: Bilateral;  APPENDECTOMY     CARDIAC CATHETERIZATION  08/29/2002   2-vessel CAD with high-grade stenoses in RCA; stenting to RCA   CARDIAC CATHETERIZATION  2014   80-90% lesion will try medical therapy   CARDIAC SURGERY     stents  2003   CARDIOVERSION N/A 11/01/2013   Procedure: [place] COMPRESSION;  Surgeon: [name], MD;  Location: [facility] CATH LAB;  Service: Cardiovascular;  Laterality: N/A;   CAROTID ANGIOGRAM N/A 04/25/2013   Procedure: CAROTID ANGIOGRAM;  Surgeon: [name], MD;  Location: [facility] CATH LAB;  Service: Cardiovascular;  Laterality: N/A;   CAROTID ENDARTERECTOMY     COLON RESECTION  3/04, 6/04    1 bleeding diverticulitis, 2 complete colectomy   COLON SURGERY  2005   renal pouch rectal anastimosis   CORONARY ANGIOPLASTY WITH STENT PLACEMENT  09/2002   2 stents to RCA   ENDARTERECTOMY Left 06/02/2013   Procedure: ENDARTERECTOMY CAROTID-LEFT;  Surgeon: Nada Libman, MD;  Location: [facility] OR;  Service: Vascular;  Laterality: Left;   INTRAVASCULAR PRESSURE WIRE/FFR STUDY N/A 04/02/2020   Procedure: INTRAVASCULAR PRESSURE WIRE/FFR STUDY;  Surgeon: [name], MD;  Location: [facility] INVAS[facility] CV LAB;  Service: Cardiovascular;  Laterality: N/A;  DFR - RCA   LEFT HEART CATH AND CORONARY ANGIOGRAPHY N/A 04/02/2020   Procedure: LEFT HEART CATH AND CORONARY ANGIOGRAPHY;  Surgeon: [name], MD;  Location: [facility] INVAS[facility] CV LAB;  Service: Cardiovascular;  Laterality: N/A;   LEFT HEART CATHETERIZATION WITH CORONARY ANGIOGRAM N/A 02/08/2013   Procedure: LEFT HEART CATHETERIZATION WITH CORONARY ANGIOGRAM;  Surgeon: [name], MD;  Location: [facility] CATH LAB;  Service: Cardiovascular;  Laterality: N/A;   LOWER EXTREMITY ANGIOGRAM N/A 10/20/2013   Procedure: LOWER EXTREMITY ANGIOGRAM;  Surgeon: [name], MD;  Location: [facility] CATH LAB;  Service: Cardiovascular;  Laterality: N/A;   PATCH ANGIOPLASTY Left 06/02/2013   Procedure: PATCH ANGIOPLASTY;  Surgeon: Nada Libman, MD;  Location: [facility] OR;  Service: Vascular;  Laterality: Left;   PERIPHERAL VASCULAR INTERVENTION Bilateral 03/30/2018   Procedure: PERIPHERAL VASCULAR INTERVENTION;  Surgeon: Nada Libman, MD;  Location: [facility] INVASIVE CV LAB;  Service: Cardiovascular;  Laterality: Bilateral;  common iliacs   TONSILLECTOMY         Family History  Problem Relation Age of Onset   Heart disease Father        heart attack at 77   Heart attack Father    Hyperlipidemia Father    Colonic polyp Mother         that bled out   COPD Mother    Diabetes Mother    Heart disease Sister    Colonic polyp Sister    Stroke Maternal Grandfather    Heart attack Paternal Grandfather    Other Neg Hx        hypogonadism    Social History   Tobacco Use   Smoking status: Every Day    Packs/day: 0.75    Years: 57.00    Pack years: 42.75    Types: Cigarettes    Last attempt to quit: 04/06/2013    Years since quitting: 7.9   Smokeless tobacco: Former    Quit date: 04/14/2013   Tobacco comments:    pt states that he is using the vapor cigs  Vaping Use   Vaping Use: Never used  Substance Use Topics   Alcohol use: Yes    Alcohol/week: 1.0 standard drink    Types: 1 Glasses of wine  per week    Comment: "very little"   Drug use: No    Home Medications Prior to Admission medications   Medication Sig Start Date End Date Taking? Authorizing Provider  amLODipine (NORVASC) 5 MG tablet Take 2 tablets (10 mg total) by mouth daily. 01/04/21   Azucena Fallen, MD  apixaban (ELIQUIS) 5 MG TABS tablet Take 1 tablet (5 mg total) by mouth 2 (two) times daily. 01/03/21   Azucena Fallen, MD  atorvastatin (LIPITOR) 40 MG tablet Take 40 mg by mouth at bedtime.     [provider]  clopidogrel (PLAVIX) 75 MG tablet Take 1 tablet (75 mg total) by mouth daily. 01/04/21   Azucena Fallen, MD  furosemide (LASIX) 20 MG tablet Take 1 tablet (20 mg total) by mouth daily. 01/03/21 03/04/21  Azucena Fallen, MD  isosorbide mononitrate (IMDUR) 60 MG 24 hr tablet Take 1 tablet (60 mg total) by mouth daily. 01/04/21   Azucena Fallen, MD  losartan (COZAAR) 50 MG tablet Take 2 tablets (100 mg total) by mouth daily. 01/04/21   Azucena Fallen, MD  metoprolol tartrate (LOPRESSOR) 25 MG tablet Take 1 tablet (25 mg total) by mouth 2 (two) times daily. 01/03/21   Azucena Fallen, MD  OZEMPIC, 1 MG/DOSE, 2 MG/1.5ML SOPN Inject 2.5 mg into the skin once a week.  03/27/20   [provider]   pantoprazole (PROTONIX) 40 MG tablet Take 40 mg by mouth daily.     [provider]  Polyethyl Glycol-Propyl Glycol (SYSTANE FREE OP) Place 1 drop into both eyes at bedtime.    [provider]    Allergies    Fentanyl, Gabapentin, Lisinopril, Lyrica [pregabalin], Metformin, and Propofol  Review of Systems   Review of Systems  Respiratory:  Positive for cough and shortness of breath.   All other systems reviewed and are negative.  Physical Exam Updated Vital Signs BP (!) 176/82   Pulse 85   Temp 98.4 F (36.9 C) (Axillary) Comment (Src): On BiPAP  Resp (!) 21   SpO2 100%   Physical Exam Vitals and nursing note reviewed.  Constitutional:      Appearance: He is well-developed.  HENT:     Head: Normocephalic and atraumatic.  Eyes:     Conjunctiva/sclera: Conjunctivae normal.     Pupils: Pupils are equal, round, and reactive to light.  Cardiovascular:     Rate and Rhythm: Normal rate and regular rhythm.     Heart sounds: Normal heart sounds.  Pulmonary:     Effort: Retractions present. No respiratory distress.     Breath sounds: Normal breath sounds. No rhonchi.     Comments: Retractions noted, speaking in short sentences through bipap, O2 97% Abdominal:     General: Bowel sounds are normal.     Palpations: Abdomen is soft.  Musculoskeletal:        General: Normal range of motion.     Cervical back: Normal range of motion.     Comments: 1+ pitting edema BLE  Skin:    General: Skin is warm and dry.  Neurological:     Mental Status: He is alert and oriented to person, place, and time.    ED Results / Procedures / Treatments   Labs (all labs ordered are listed, but only abnormal results are displayed) Labs Reviewed  CBC WITH DIFFERENTIAL/PLATELET - Abnormal; Notable for the following components:      Result Value   WBC 12.0 (*)  Hemoglobin 12.9 (*)    HCT 38.8 (*)    Neutro Abs 9.6 (*)    All other components within normal limits   COMPREHENSIVE METABOLIC PANEL - Abnormal; Notable for the following components:   Glucose, Bld 143 (*)    Creatinine, Ser 1.27 (*)    Albumin 3.4 (*)    GFR, Estimated 58 (*)    All other components within normal limits  BRAIN NATRIURETIC PEPTIDE - Abnormal; Notable for the following components:   B Natriuretic Peptide 486.9 (*)    All other components within normal limits  RESP PANEL BY RT-PCR (FLU A&B, COVID) ARPGX2  I-STAT ARTERIAL BLOOD GAS, ED  TROPONIN I (HIGH SENSITIVITY)  TROPONIN I (HIGH SENSITIVITY)    EKG None  Radiology No results found.  Procedures Procedures   CRITICAL CARE Performed by: Garlon Hatchet   Total critical care time: 45 minutes  Critical care time was exclusive of separately billable procedures and treating other patients.  Critical care was necessary to treat or prevent imminent or life-threatening deterioration.  Critical care was time spent personally by me on the following activities: development of treatment plan with patient and/or surrogate as well as nursing, discussions with consultants, evaluation of patient's response to treatment, examination of patient, obtaining history from patient or surrogate, ordering and performing treatments and interventions, ordering and review of laboratory studies, ordering and review of radiographic studies, pulse oximetry and re-evaluation of patient's condition.   Medications Ordered in ED Medications  furosemide (LASIX) injection 40 mg (40 mg Intravenous Given 03/10/21 0432)    ED Course  I have reviewed the triage vital signs and the nursing notes.  Pertinent labs & imaging results that were available during my care of the patient were reviewed by me and considered in my medical decision making (see chart for details).    MDM Rules/Calculators/A&P  80 year old male presenting to the ED with respiratory distress.  Arrived on CPAP and transition to BiPAP once placed in treatment room.  He is  tolerating this well.  Reports getting cold about 2 weeks ago, worsening symptoms over the past 3 days.  Has had cough without fever or chills.  No hemoptysis.  Does have history of CHF and A. fib.  Supposed to be on Eliquis, EMS reports some noncompliance with this.  Patient does appear somewhat fluid overloaded, does have 1+ edema BLE.  Work-up pending including EKG, labs, COVID screen, chest x-ray, ABG.  Patient's work-up is overall consistent with CHF.  BNP nearly 600 but trop negative.  CXR does appear to have vascular congestion on my exam, awaiting formal read.  Patient has been given lasix, BP has improved with this.  He remains stable on bipap and is tolerating well.    Discussed with hospitalist, Dr. Loney Loh-- will admit for ongoing care.  Final Clinical Impression(s) / ED Diagnoses Final diagnoses:  Acute on chronic congestive heart failure, unspecified heart failure type [facility]    Rx / DC Orders ED Discharge Orders     None        Garlon Hatchet, PA-C 03/10/21 0535    Garlon Hatchet, PA-C 03/10/21 7209    Zadie Rhine, MD 03/11/21 [phone]

## 2021-03-10 NOTE — ED Notes (Addendum)
EDP notified on patient's hypertension .  

## 2021-03-10 NOTE — ED Notes (Addendum)
Attempted to call report. RN not available at this time. Extension given for call back. 

## 2021-03-10 NOTE — H&P (Signed)
History and Physical    [name]:[id number] DOB: [birth year] DOA: 03/10/2021  PCP: [name], MD Patient coming from: Home  Chief Complaint: Shortness of breath  HPI: Albert Powell is a 80 y.o. male with medical history significant of paroxysmal A. fib on Eliquis with history of noncompliance, chronic diastolic CHF, CAD status post PCI with stenting, carotid artery stenosis status post left CEA, COPD, tobacco use, GERD, type II diabetes, hypertension, hyperlipidemia, PVD, CKD stage IIIa.  He was admitted in April 2022 for hypertensive emergency, flash pulmonary edema, A. fib with RVR due to medication noncompliance.  He now presents to the ED via EMS for evaluation of respiratory distress.  On EMS arrival, he was satting in the 70s on room air and was placed on CPAP.  Blood pressure 176/82 on arrival.  Transitioned to BiPAP which he tolerated well.  Labs showing mild leukocytosis (WBC 12.0).  BNP elevated at 486.  Initial high-sensitivity troponin negative, repeat pending.  EKG without acute ischemic changes.  COVID and influenza PCR negative.  Chest x-ray showing mild cardiogenic failure, small right pleural effusion. Patient was given IV Lasix 40 mg.  Patient reports dyspnea on exertion, orthopnea, and bilateral lower extremity edema.  Symptoms started a few weeks ago and have been getting progressively worse.  Last night he could barely catch his breath.  Reports cough but no fevers.  Reports taking Lasix 20 mg daily and has not missed any doses.  States he does not drink a lot of fluid and watches his salt intake.  States his blood pressure normally does not run high and he has not missed any doses of his blood pressure medications although he does not know the names of the medications or how many medications he takes.  No other complaints.  Review of Systems:  All systems reviewed and apart from history of presenting illness, are negative.  Past Medical History:   Diagnosis Date   Atrial fibrillation ([name]) 03/30/2020   Bradycardia, sinus 02/18/2013   CAD (coronary artery disease)    stent to RCA 2003 also had 40% lesion at that time; NUCLEAR STRESS TEST, 01/16/2010 - normal  now with cath 80-90% stenosis in LAD, will try medical therapy if no improvement PCI   Carotid artery disease ([name])    Cataract    Claudication ([name])    LEA DUPLEX, 07/07/2008 - Normal   COPD (chronic obstructive pulmonary disease) ([name])    DDD (degenerative disc disease) 2008   Diabetes mellitus    GERD (gastroesophageal reflux disease)    H/O hiatal hernia    History of colonoscopy 2004   finding of tics and AMV only no polpys   Hyperlipidemia 02/05/2013   Hypertension    Onychomycosis    Rosacea    Tobacco abuse 02/05/2013    Past Surgical History:  Procedure Laterality Date   ABDOMINAL AORTOGRAM W/LOWER EXTREMITY N/A 03/30/2018   Procedure: ABDOMINAL AORTOGRAM W/LOWER EXTREMITY;  Surgeon: Nada Libman, MD;  Location: [facility];  Service: Cardiovascular;  Laterality: N/A;   ABDOMINAL AORTOGRAM W/LOWER EXTREMITY Bilateral 08/27/2020   Procedure: ABDOMINAL AORTOGRAM W/LOWER EXTREMITY;  Surgeon: [name], MD;  Location: [facility];  Service: Cardiovascular;  Laterality: Bilateral;   APPENDECTOMY     CARDIAC CATHETERIZATION  08/29/2002   2-vessel CAD with high-grade stenoses in RCA; stenting to RCA   CARDIAC CATHETERIZATION  2014   80-90% lesion will try medical therapy   CARDIAC SURGERY  stents  2003   CARDIOVERSION N/A 11/01/2013   Procedure: Dimple Nanas COMPRESSION;  Surgeon: [name], MD;  Location: [facility] CATH LAB;  Service: Cardiovascular;  Laterality: N/A;   CAROTID ANGIOGRAM N/A 04/25/2013   Procedure: CAROTID ANGIOGRAM;  Surgeon: [name], MD;  Location: [facility] CATH LAB;  Service: Cardiovascular;  Laterality: N/A;   CAROTID ENDARTERECTOMY     COLON RESECTION  3/04, 6/04    1 bleeding diverticulitis, 2 complete  colectomy   COLON SURGERY  2005   renal pouch rectal anastimosis   CORONARY ANGIOPLASTY WITH STENT PLACEMENT  09/2002   2 stents to RCA   ENDARTERECTOMY Left 06/02/2013   Procedure: ENDARTERECTOMY CAROTID-LEFT;  Surgeon: Nada Libman, MD;  Location: [facility] OR;  Service: Vascular;  Laterality: Left;   INTRAVASCULAR PRESSURE WIRE/FFR STUDY N/A 04/02/2020   Procedure: INTRAVASCULAR PRESSURE WIRE/FFR STUDY;  Surgeon: [name], MD;  Location: [facility];  Service: Cardiovascular;  Laterality: N/A;  DFR - RCA   LEFT HEART CATH AND CORONARY ANGIOGRAPHY N/A 04/02/2020   Procedure: LEFT HEART CATH AND CORONARY ANGIOGRAPHY;  Surgeon: [name], MD;  Location: [facility];  Service: Cardiovascular;  Laterality: N/A;   LEFT HEART CATHETERIZATION WITH CORONARY ANGIOGRAM N/A 02/08/2013   Procedure: LEFT HEART CATHETERIZATION WITH CORONARY ANGIOGRAM;  Surgeon: [name], MD;  Location: [facility] CATH LAB;  Service: Cardiovascular;  Laterality: N/A;   LOWER EXTREMITY ANGIOGRAM N/A 10/20/2013   Procedure: LOWER EXTREMITY ANGIOGRAM;  Surgeon: [name], MD;  Location: [facility] CATH LAB;  Service: Cardiovascular;  Laterality: N/A;   PATCH ANGIOPLASTY Left 06/02/2013   Procedure: PATCH ANGIOPLASTY;  Surgeon: Nada Libman, MD;  Location: [facility] OR;  Service: Vascular;  Laterality: Left;   PERIPHERAL VASCULAR INTERVENTION Bilateral 03/30/2018   Procedure: PERIPHERAL VASCULAR INTERVENTION;  Surgeon: Nada Libman, MD;  Location: [facility];  Service: Cardiovascular;  Laterality: Bilateral;  common iliacs   TONSILLECTOMY       reports that he has been smoking cigarettes. He has a 42.75 pack-year smoking history. He quit smokeless tobacco use about 7 years ago. He reports current alcohol use of about 1.0 standard drink of alcohol per week. He reports that he does not use drugs.  Allergies  Allergen Reactions   Fentanyl Other (See Comments)    Behavioral changes   Gabapentin Other  (See Comments)    ankle swells   Lisinopril Other (See Comments)    unknown   Lyrica [Pregabalin] Other (See Comments)    unknown   Metformin Diarrhea   Propofol Other (See Comments)    Heart rate dropped    Family History  Problem Relation Age of Onset   Heart disease Father        heart attack at 66   Heart attack Father    Hyperlipidemia Father    Colonic polyp Mother        that bled out   COPD Mother    Diabetes Mother    Heart disease Sister    Colonic polyp Sister    Stroke Maternal Grandfather    Heart attack Paternal Grandfather    Other Neg Hx        hypogonadism    Prior to Admission medications   Medication Sig Start Date End Date Taking? Authorizing Provider  amLODipine (NORVASC) 5 MG tablet Take 2 tablets (10 mg total) by mouth daily. 01/04/21  Yes Azucena Fallen, MD  atorvastatin (LIPITOR) 40  MG tablet Take 40 mg by mouth at bedtime.    Yes [provider]  clopidogrel (PLAVIX) 75 MG tablet Take 1 tablet (75 mg total) by mouth daily. 01/04/21  Yes Azucena Fallen, MD  escitalopram (LEXAPRO) 20 MG tablet Take 20 mg by mouth daily. 01/18/21  Yes [provider]  furosemide (LASIX) 20 MG tablet Take 1 tablet (20 mg total) by mouth daily. 01/03/21 03/10/21 Yes Azucena Fallen, MD  glipiZIDE (GLUCOTROL) 5 MG tablet Take 5 mg by mouth daily. 01/18/21  Yes [provider]  isosorbide mononitrate (IMDUR) 60 MG 24 hr tablet Take 1 tablet (60 mg total) by mouth daily. 01/04/21  Yes Azucena Fallen, MD  losartan (COZAAR) 100 MG tablet Take 100 mg by mouth daily. 01/18/21  Yes [provider]  metoprolol tartrate (LOPRESSOR) 25 MG tablet Take 1 tablet (25 mg total) by mouth 2 (two) times daily. 01/03/21  Yes Azucena Fallen, MD  OZEMPIC, 1 MG/DOSE, 2 MG/1.5ML SOPN Inject 2.5 mg into the skin once a week.  03/27/20  Yes [provider]  pantoprazole (PROTONIX) 40 MG tablet Take 40 mg by mouth daily.    Yes [provider]  Polyethyl Glycol-Propyl Glycol (SYSTANE FREE OP) Place 1 drop into both eyes daily as needed (dry eyes).   Yes [provider]  apixaban (ELIQUIS) 5 MG TABS tablet Take 1 tablet (5 mg total) by mouth 2 (two) times daily. Patient not taking: No sig reported 01/03/21   Azucena Fallen, MD  losartan (COZAAR) 50 MG ta  Take 2 tablets (100 mg total) by mouth daily. Patient not taking: Reported on 03/10/2021 01/04/21   Azucena Fallen, MD    Physical Exam: Vitals:   03/10/21 0515 03/10/21 0530 03/10/21 0600 03/10/21 0615  BP: (!) 194/88 (!) 202/70 (!) 179/73 (!) 176/53  Pulse: 82 82 84 85  Resp: 18 17 15  (!) 29  Temp:      TempSrc:      SpO2: 98% 96% 95% 91%    Physical Exam Constitutional:      General: He is not in acute distress. HENT:     Head: Normocephalic and atraumatic.  Eyes:     Extraocular Movements: Extraocular movements intact.     Conjunctiva/sclera: Conjunctivae normal.  Neck:     Comments: JVD present Cardiovascular:     Rate and Rhythm: Normal rate and regular rhythm.     Pulses: Normal pulses.  Pulmonary:     Effort: Pulmonary effort is normal. No respiratory distress.     Breath sounds: Rales present. No wheezing.     Comments: Satting in the mid 90s on 3 L supplemental oxygen Abdominal:     General: Bowel sounds are normal.     Palpations: Abdomen is soft.     Tenderness: There is no abdominal tenderness. There is no guarding.  Musculoskeletal:     Cervical back: Normal range of motion and neck supple.     Right lower leg: Edema present.     Left lower leg: Edema present.     Comments: +3 pitting edema of bilateral lower extremities  Skin:    General: Skin is warm and dry.  Neurological:     General: No focal deficit present.     Mental Status: He is alert and oriented to person, place, and time.     Labs on Admission: I have personally reviewed following labs and imaging studies  CBC: Recent Labs  Lab  03/10/21 0307  WBC 12.0*  NEUTROABS 9.6*  HGB 12.9*  HCT 38.8*  MCV 87.8  PLT 288   Basic Metabolic Panel: Recent Labs  Lab 03/10/21 0307  NA 137  K 3.6  CL 105  CO2 24  GLUCOSE 143*  BUN 15  CREATININE 1.27*  CALCIUM 9.1   GFR: CrCl cannot be calculated (Unknown ideal weight.). Liver Function Tests: Recent Labs  Lab 03/10/21 0307  AST 18  ALT 19  ALKPHOS 84  BILITOT 0.7  PROT 6.5  ALBUMIN 3.4*   No results for input(s): LIPASE, AMYLASE in the last 168 hours. No results for input(s): AMMONIA in the last 168 hours. Coagulation Profile: No results for input(s): INR, PROTIME in the last 168 hours. Cardiac Enzymes: No results for input(s): CKTOTAL, CKMB, CKMBINDEX, TROPONINI in the last 168 hours. BNP (last 3 results) No results for input(s): PROBNP in the last 8760 hours. HbA1C: No results for input(s): HGBA1C in the last 72 hours. CBG: No results for input(s): GLUCAP in the last 168 hours. Lipid Profile: No results for input(s): CHOL, HDL, LDLCALC, TRIG, CHOLHDL, LDLDIRECT in the last 72 hours. Thyroid Function Tests: No results for input(s): TSH, T4TOTAL, FREET4, T3FREE, THYROIDAB in the last 72 hours. Anemia Panel: No results for input(s): VITAMINB12, FOLATE, FERRITIN, TIBC, IRON, RETICCTPCT in the last 72 hours. Urine analysis:    Component Value Date/Time   COLORURINE YELLOW 01/01/2021 1351   APPEARANCEUR CLEAR 01/01/2021 1351   LABSPEC 1.009 01/01/2021 1351   PHURINE 6.0 01/01/2021 1351   GLUCOSEU >=500 (A) 01/01/2021 1351   HGBUR SMALL (A) 01/01/2021 1351   BILIRUBINUR NEGATIVE 01/01/2021 1351   KETONESUR NEGATIVE 01/01/2021 1351   PROTEINUR >=300 (A) 01/01/2021 1351   UROBILINOGEN 1.0 05/31/2013 1432   NITRITE NEGATIVE 01/01/2021 1351   LEUKOCYTESUR NEGATIVE 01/01/2021 1351    Radiological Exams on Admission: DG Chest Port 1 View  Result Date: 03/10/2021 CLINICAL DATA:  Dyspnea, EXAM: PORTABLE CHEST 1 VIEW COMPARISON:  01/01/2021 FINDINGS:  Mild to moderate bilateral perihilar interstitial pulmonary infiltrate is present most in keeping with mild cardiogenic failure. Small right pleural effusion is present contributing to right basilar opacification. No pneumothorax. Mild cardiomegaly is stable. Dorsal column stimulator leads noted. IMPRESSION: Mild cardiogenic failure. Small right pleural effusion. Stable cardiomegaly. Electronically Signed   By: Helyn NumbersAshesh  Parikh MD   On: 03/10/2021 05:27    EKG: Independently reviewed.  Sinus rhythm, no acute ischemic changes.  Assessment/Plan Principal Problem:   CHF exacerbation ([name]) Active Problems:   Type 2 diabetes mellitus ([name])   Hyperlipidemia   Acute hypoxemic respiratory failure ([name])   Uncontrolled hypertension   Acute hypoxemic respiratory failure secondary to acute on chronic diastolic CHF Presented with complaints of progressively worsening dyspnea on exertion, orthopnea, and bilateral lower extremity edema for a few weeks.  Initially required BiPAP but now transition to 3 L supplemental oxygen and satting well without signs of respiratory distress.  Appears volume overloaded on exam with rales, JVD, and bilateral lower extremity edema.  BNP elevated at 486.  Chest x-ray showing mild cardiogenic failure and small right pleural effusion.  Echo done in July 2021 showing normal systolic function and grade 1 diastolic dysfunction. -IV Lasix 40 mg twice daily.  Repeat echocardiogram.  Monitor intake and output, daily weights, low-sodium diet with fluid restriction.  Continue supplemental oxygen, wean as tolerated.  Uncontrolled hypertension History of medication noncompliance.  Blood pressure elevated with systolic in the 170s at present. -Continue IV Lasix.  Resume  home amlodipine, Imdur, losartan, and metoprolol.  Monitor blood pressure closely.  Paroxysmal A. fib Currently in sinus rhythm. -Continue metoprolol and Eliquis  CAD status post PCI with stenting -Continue Plavix,  metoprolol, and Lipitor  COPD No signs of acute exacerbation.  He is not on any inhalers at home. -DuoNeb as needed  Non-insulin-dependent type 2 diabetes A1c 7.5 on 01/02/2021. -Sliding scale insulin sensitive ACHS.  Hyperlipidemia -Continue Lipitor  CKD stage IIIa Creatinine at baseline. -Continue to monitor renal function  GERD -Continue Protonix  Mood disorder -Continue Lexapro  DVT prophylaxis: Eliquis Code Status: Patient wishes to be DNR/DNI. Family Communication: No family available at this time. Disposition Plan: Status is: Inpatient  Remains inpatient appropriate because:Inpatient level of care appropriate due to severity of illness  Dispo: The patient is from: Home              Anticipated d/c is to: Home              Patient currently is not medically stable to d/c.   Difficult to place patient No  Level of care: Level of care: Progressive  The medical decision making on this patient was of high complexity and the patient is at high risk for clinical deterioration, therefore this is a level 3 visit.  John Giovanni MD [facility]  If 7PM-7AM, please contact night-coverage [email or web address]  03/10/2021, 6:43 AM

## 2021-03-11 ENCOUNTER — Other Ambulatory Visit (HOSPITAL_COMMUNITY): Payer: Self-pay

## 2021-03-11 DIAGNOSIS — J810 Acute pulmonary edema: Secondary | ICD-10-CM | POA: Diagnosis not present

## 2021-03-11 DIAGNOSIS — E1136 Type 2 diabetes mellitus with diabetic cataract: Secondary | ICD-10-CM | POA: Diagnosis not present

## 2021-03-11 DIAGNOSIS — M519 Unspecified thoracic, thoracolumbar and lumbosacral intervertebral disc disorder: Secondary | ICD-10-CM | POA: Diagnosis not present

## 2021-03-11 DIAGNOSIS — E1122 Type 2 diabetes mellitus with diabetic chronic kidney disease: Secondary | ICD-10-CM | POA: Diagnosis not present

## 2021-03-11 DIAGNOSIS — I129 Hypertensive chronic kidney disease with stage 1 through stage 4 chronic kidney disease, or unspecified chronic kidney disease: Secondary | ICD-10-CM | POA: Diagnosis not present

## 2021-03-11 DIAGNOSIS — N183 Chronic kidney disease, stage 3 unspecified: Secondary | ICD-10-CM | POA: Diagnosis not present

## 2021-03-11 DIAGNOSIS — J449 Chronic obstructive pulmonary disease, unspecified: Secondary | ICD-10-CM | POA: Diagnosis not present

## 2021-03-11 DIAGNOSIS — I2510 Atherosclerotic heart disease of native coronary artery without angina pectoris: Secondary | ICD-10-CM | POA: Diagnosis not present

## 2021-03-11 DIAGNOSIS — I482 Chronic atrial fibrillation, unspecified: Secondary | ICD-10-CM | POA: Diagnosis not present

## 2021-03-11 DIAGNOSIS — R270 Ataxia, unspecified: Secondary | ICD-10-CM | POA: Diagnosis not present

## 2021-03-11 DIAGNOSIS — I6529 Occlusion and stenosis of unspecified carotid artery: Secondary | ICD-10-CM | POA: Diagnosis not present

## 2021-03-11 DIAGNOSIS — E1151 Type 2 diabetes mellitus with diabetic peripheral angiopathy without gangrene: Secondary | ICD-10-CM | POA: Diagnosis not present

## 2021-03-11 LAB — GLUCOSE, CAPILLARY
Glucose-Capillary: 139 mg/dL — ABNORMAL HIGH (ref 70–99)
Glucose-Capillary: 161 mg/dL — ABNORMAL HIGH (ref 70–99)
Glucose-Capillary: 103 mg/dL — ABNORMAL HIGH (ref 70–99)
Glucose-Capillary: 223 mg/dL — ABNORMAL HIGH (ref 70–99)

## 2021-03-11 LAB — BASIC METABOLIC PANEL WITH GFR
Anion gap: 11 (ref 5–15)
BUN: 18 mg/dL (ref 8–23)
CO2: 27 mmol/L (ref 22–32)
Calcium: 8.6 mg/dL — ABNORMAL LOW (ref 8.9–10.3)
Chloride: 99 mmol/L (ref 98–111)
Creatinine, Ser: 1.33 mg/dL — ABNORMAL HIGH (ref 0.61–1.24)
GFR, Estimated: 55 mL/min — ABNORMAL LOW
Glucose, Bld: 106 mg/dL — ABNORMAL HIGH (ref 70–99)
Potassium: 3 mmol/L — ABNORMAL LOW (ref 3.5–5.1)
Sodium: 137 mmol/L (ref 135–145)

## 2021-03-11 LAB — PHOSPHORUS: Phosphorus: 3.3 mg/dL (ref 2.5–4.6)

## 2021-03-11 LAB — CBC
HCT: 35.3 % — ABNORMAL LOW (ref 39.0–52.0)
Hemoglobin: 11.6 g/dL — ABNORMAL LOW (ref 13.0–17.0)
MCH: 28.7 pg (ref 26.0–34.0)
MCHC: 32.9 g/dL (ref 30.0–36.0)
MCV: 87.4 fL (ref 80.0–100.0)
Platelets: 237 10*3/uL (ref 150–400)
RBC: 4.04 MIL/uL — ABNORMAL LOW (ref 4.22–5.81)
RDW: 14.6 % (ref 11.5–15.5)
WBC: 8.4 10*3/uL (ref 4.0–10.5)
nRBC: 0 % (ref 0.0–0.2)

## 2021-03-11 LAB — MAGNESIUM: Magnesium: 1.9 mg/dL (ref 1.7–2.4)

## 2021-03-11 MED ORDER — POTASSIUM CHLORIDE CRYS ER 20 MEQ PO TBCR
40.0000 meq | EXTENDED_RELEASE_TABLET | Freq: Once | ORAL | Status: AC
  Administered 2021-03-11: 40 meq via ORAL
  Filled 2021-03-11: qty 2

## 2021-03-11 MED ORDER — POTASSIUM CHLORIDE CRYS ER 20 MEQ PO TBCR
40.0000 meq | EXTENDED_RELEASE_TABLET | Freq: Every day | ORAL | Status: DC
  Administered 2021-03-11 – 2021-03-12 (×2): 40 meq via ORAL
  Filled 2021-03-11 (×2): qty 2

## 2021-03-11 MED ORDER — SACUBITRIL-VALSARTAN 49-51 MG PO TABS
1.0000 | ORAL_TABLET | Freq: Two times a day (BID) | ORAL | Status: DC
  Administered 2021-03-11 – 2021-03-12 (×2): 1 via ORAL
  Filled 2021-03-11 (×3): qty 1

## 2021-03-11 NOTE — Progress Notes (Addendum)
Heart Failure Stewardship Pharmacist Progress Note   PCP: [name], MD PCP-Cardiologist: Nanetta Batty, MD    HPI:  80 yo M w/ PMH of Afib, diastolic CHF, CAD s/p PCI, carotid artery stenosis, COPD, tobacco use, GERD, T2DM, HTN, HLD, PVD, CKD IIIa, hx of medication noncompliance. Presented for evaluation of respiratory distress requiring BiPAP, BP 176/82, BNP 486, CXR showed mild cardiogenic failure. Pt reported worsening SOB, orthopnea, LEE for a few weeks. Reports compliance with Lasix 20mg  daily and other BP meds. ECHO revealed EF 60-65%.   Current HF Medications: - Furosemide 40mg  IV BID - Imdur 60mg  daily - Losartan 100mg  daily - Metoprolol tartrate 25mg  BID - Potassium daily  Prior to admission HF Medications: - Furosemide 20mg  daily - Imdur 60mg  daily - Losartan 100mg  daily - Metoprolol tartrate 25mg  BID  Pertinent Lab Values: Serum creatinine 1.33, BUN 18, Potassium 3.0, Sodium 137, BNP 486, Magnesium 1.9   Vital Signs: Weight: 236 lbs (admission weight: 236 lbs) Blood pressure: 181/71  Heart rate: 70s   Medication Assistance / Insurance Benefits Check: Does the patient have prescription insurance?  Yes Type of insurance plan: BCBS medicare supplement  Outpatient Pharmacy:  Prior to admission outpatient [facility] Is the patient willing to use [facility] TOC pharmacy at discharge? Yes Is the patient willing to transition their outpatient pharmacy to utilize a [facility] outpatient pharmacy?   Pending    Assessment: 1. Acute on chronic diastolic CHF (EF ), due to ICM. NYHA class III symptoms. - K+ 3.0 this AM, replenished with x1, then daily - BP still elevated - Consider changing losartan to [facility] for HFpEF optimization + additional BP control - Consider switching metoprolol tartrate to carvedilol for added BP control - Consider adding Farxiga prior to discharge for HFpEF   Plan: 1) Medication changes recommended at this  time: - Switch losartan 100mg  to Entresto 49/51mg  BID  2) Patient assistance: - Pt is in donut hole/coverage gap. Normal co-pay unknown. Patient assistance can be obtained if needed. Copay information below pertains to copay while in donut hole.  - Farxiga $148.27 - Jardiance $154  3)  Education  - To be completed prior to discharge - HF TOC f/u appointment scheduled for 7/7  [facility] ([place]) Johnna Bollier, PharmD Student

## 2021-03-11 NOTE — Progress Notes (Signed)
Albert Powell  ALP:[id number] DOB: May 09, 1941 DOA: 03/10/2021 PCP: [name], MD    Brief Narrative:  80 year old with a history of paroxysmal atrial fibrillation on chronic Eliquis, noncompliance, chronic diastolic CHF, CAD status post PCI/stenting, status post CEA, COPD, GERD, DM2, HTN, HLD, PVD, CKD stage III, AAA, and tobacco abuse who presented to the ED with severe shortness of breath.  He was found to be saturating in the 70s on room air and initially required CPAP.  EKG was without acute ischemic change.  CXR was suggestive of mild pulmonary edema and a small right pleural effusion.  Consultants:  None  Code Status: NO CODE BLUE  Antimicrobials:  None  DVT prophylaxis: Eliquis  Subjective: Afebrile.  Blood pressure poorly controlled at 160-180 systolic.  Saturations stable on oxygen support.  Patient states he feels significantly less short of breath but is not yet at his baseline.  Denies chest pain nausea vomiting or abdominal pain.  He has been weaned to nasal cannula support.  Assessment & Plan:  Acute on chronic diastolic CHF -pulmonary edema -acute hypoxic respiratory failure Initially required BiPAP support -TTE July 2021 noted normal systolic function and grade 1 diastolic dysfunction -continue twice daily IV diuretic - net -2.8 L thus far -etiology likely noncompliance with medical therapy  Filed Weights   03/10/21 1724 03/11/21 0431  Weight: 107 kg 107.1 kg     Hypokalemia Due to diuretic -supplement and follow -check magnesium  Uncontrolled HTN History of medication noncompliance -continue diuresis and resume prescribed home medications and follow  Chronic paroxysmal atrial fibrillation Continue metoprolol and Eliquis -rate controlled at present in NSR  CAD status post PCI with stenting Continue Plavix, metoprolol, Lipitor  COPD No acute exacerbation presently -is not on chronic inhaled medications  DM2 A1c 7.5 in April of this year  -CBG reasonably controlled at this time  Normocytic anemia Likely due to chronic kidney disease -check anemia panel  HLD Continue Lipitor  CKD stage IIIa Creatinine stable at baseline  GERD Continue PPI   Family Communication: No family present at time of exam Status is: Inpatient  Remains inpatient appropriate because:Inpatient level of care appropriate due to severity of illness  Dispo: The patient is from: Home              Anticipated d/c is to:  Unclear              Patient currently is not medically stable to d/c.   Difficult to place patient No  Objective: Blood pressure (!) 167/71, pulse 78, temperature 98.3 F (36.8 C), temperature source Oral, resp. rate 16, height 5\' 9"  (1.753 m), weight 107.1 kg, SpO2 98 %.  Intake/Output Summary (Last 24 hours) at 03/11/2021 1050 Last data filed at 03/11/2021 1022 Gross per 24 hour  Intake 840 ml  Output 3650 ml  Net -2810 ml   Filed Weights   03/10/21 1724 03/11/21 0431  Weight: 107 kg 107.1 kg    Examination: General: Resting comfortably in bed in no extremis Lungs: Fine crackles scattered throughout with no wheezing Cardiovascular: Regular rate and rhythm without murmur gallop or rub normal S1 and S2 Abdomen: Overweight, soft, no mass, no rebound Extremities: 2+ bilateral lower extremity edema without cyanosis  CBC: Recent Labs  Lab 03/10/21 0307 03/11/21 0221  WBC 12.0* 8.4  NEUTROABS 9.6*  --   HGB 12.9* 11.6*  HCT 38.8* 35.3*  MCV 87.8 87.4  PLT 288 237   Basic Metabolic Panel: Recent Labs  Lab 03/10/21 0307 03/11/21 0221  NA 137 137  K 3.6 3.0*  CL 105 99  CO2 24 27  GLUCOSE 143* 106*  BUN 15 18  CREATININE 1.27* 1.33*  CALCIUM 9.1 8.6*  MG  --  1.9  PHOS  --  3.3   GFR: Estimated Creatinine Clearance: 55.2 mL/min (A) (by C-G formula based on SCr of 1.33 mg/dL (H)).  Liver Function Tests: Recent Labs  Lab 03/10/21 0307  AST 18  ALT 19  ALKPHOS 84  BILITOT 0.7  PROT 6.5  ALBUMIN  3.4*    HbA1C: Hgb A1c MFr Bld  Date/Time Value Ref Range Status  01/02/2021 12:11 AM 7.5 (H) 4.8 - 5.6 % Final    Comment:    (NOTE) Pre diabetes:          5.7%-6.4%  Diabetes:              >6.4%  Glycemic control for   <7.0% adults with diabetes   03/30/2020 06:37 PM 6.7 (H) 4.8 - 5.6 % Final    Comment:    (NOTE) Pre diabetes:          5.7%-6.4%  Diabetes:              >6.4%  Glycemic control for   <7.0% adults with diabetes     CBG: Recent Labs  Lab 03/10/21 0908 03/10/21 1219 03/10/21 1609 03/10/21 2041 03/11/21 0847  GLUCAP 151* 136* 151* 163* 139*    Recent Results (from the past 240 hour(s))  Resp Panel by RT-PCR (Flu A&B, Covid) Nasopharyngeal Swab     Status: None   Collection Time: 03/10/21  3:08 AM   Specimen: Nasopharyngeal Swab; Nasopharyngeal(NP) swabs in vial transport medium  Result Value Ref Range Status   SARS Coronavirus 2 by RT PCR NEGATIVE NEGATIVE Final    Comment: (NOTE) SARS-CoV-2 target nucleic acids are NOT DETECTED.  The SARS-CoV-2 RNA is generally detectable in upper respiratory specimens during the acute phase of infection. The lowest concentration of SARS-CoV-2 viral copies this assay can detect is 138 copies/mL. A negative result does not preclude SARS-Cov-2 infection and should not be used as the sole basis for treatment or other patient management decisions. A negative result may occur with  improper specimen collection/handling, submission of specimen other than nasopharyngeal swab, presence of viral mutation(s) within the areas targeted by this assay, and inadequate number of viral copies(<138 copies/mL). A negative result must be combined with clinical observations, patient history, and epidemiological information. The expected result is Negative.  Fact Sheet for Patients:  BloggerCourse.com  Fact Sheet for Healthcare Providers:  SeriousBroker.it  This test is no t  yet approved or cleared by the [place] FDA and  has been authorized for detection and/or diagnosis of SARS-CoV-2 by FDA under an Emergency Use Authorization (EUA). This EUA will remain  in effect (meaning this test can be used) for the duration of the COVID-19 declaration under Section 564(b)(1) of the Act, 21 U.S.C.section 360bbb-3(b)(1), unless the authorization is terminated  or revoked sooner.       Influenza A by PCR NEGATIVE NEGATIVE Final   Influenza B by PCR NEGATIVE NEGATIVE Final    Comment: (NOTE) The Xpert Xpress SARS-CoV-2/FLU/RSV plus assay is intended as an aid in the diagnosis of influenza from Nasopharyngeal swab specimens and should not be used as a sole basis for treatment. Nasal washings and aspirates are unacceptable for Xpert Xpress SARS-CoV-2/FLU/RSV testing.  Fact Sheet for Patients: BloggerCourse.com  Fact  Sheet for Healthcare Providers: SeriousBroker.it  This test is not yet approved or cleared by the [place] and has been authorized for detection and/or diagnosis of SARS-CoV-2 by FDA under an Emergency Use Authorization (EUA). This EUA will remain in effect (meaning this test can be used) for the duration of the COVID-19 declaration under Section 564(b)(1) of the Act, 21 U.S.C. section 360bbb-3(b)(1), unless the authorization is terminated or revoked.  Performed at [facility], [address], [place], [place] [address]      Scheduled Meds:  amLODipine  10 mg Oral Daily   apixaban  5 mg Oral BID   atorvastatin  40 mg Oral QHS   clopidogrel  75 mg Oral Daily   escitalopram  20 mg Oral Daily   furosemide  40 mg Intravenous BID   insulin aspart  0-5 Units Subcutaneous QHS   insulin aspart  0-9 Units Subcutaneous TID WC   isosorbide mononitrate  60 mg Oral Daily   losartan  100 mg Oral Daily   metoprolol tartrate  25 mg Oral BID   pantoprazole  40 mg Oral Daily   potassium  chloride  40 mEq Oral Daily      LOS: 1 day   Lonia Blood, MD [facility] Office  [phone] Pager - Text Page per Loretha Stapler  If 7PM-7AM, please contact night-coverage per Amion 03/11/2021, 10:50 AM

## 2021-03-11 NOTE — Evaluation (Signed)
Physical Therapy Evaluation Patient Details Name: Albert Powell MRN: [id number] DOB: 1941-07-02 Today's Date: 03/11/2021   History of Present Illness  Pt adm on 6/26 with acute on chronic diastolic heart failure, pulmonary edema, and acute hypoxic resp failure initially requiring bipap. PMH - chf, afib, cad, CEA, copd, cm, htn, PVD, ckd, AAA  Clinical Impression  Pt doing well with mobility and no further PT needed.  Ready for dc from PT standpoint.     Follow Up Recommendations No PT follow up    Equipment Recommendations  None recommended by PT    Recommendations for Other Services       Precautions / Restrictions Precautions Precautions: None      Mobility  Bed Mobility Overal bed mobility: Modified Independent                  Transfers Overall transfer level: Modified independent Equipment used: None                Ambulation/Gait Ambulation/Gait assistance: Modified independent (Device/Increase time) Gait Distance (Feet): 425 Feet Assistive device: 4-wheeled walker Gait Pattern/deviations: Step-through pattern;Decreased stride length Gait velocity: decr Gait velocity interpretation: 1.31 - 2.62 ft/sec, indicative of limited community ambulator General Gait Details: Steady gait with rollator. Amb several steps in room without assistive device as well without loss of balance  Stairs            Wheelchair Mobility    Modified Rankin (Stroke Patients Only)       Balance Overall balance assessment: Mild deficits observed, not formally tested                                           Pertinent Vitals/Pain Pain Assessment: No/denies pain    Home Living Family/patient expects to be discharged to:: Private residence Living Arrangements: Spouse/significant other Available Help at Discharge: Family;Available 24 hours/day Type of Home: Apartment Home Access: Level entry     Home Layout: One level Home Equipment:  Cane - single point;Walker - 4 wheels;Shower seat      Prior Function Level of Independence: Independent with assistive device(s)         Comments: uses cane or rollator as needed     Hand Dominance   Dominant Hand: Right    Extremity/Trunk Assessment   Upper Extremity Assessment Upper Extremity Assessment: Overall WFL for tasks assessed    Lower Extremity Assessment Lower Extremity Assessment: Overall WFL for tasks assessed       Communication   Communication: No difficulties  Cognition Arousal/Alertness: Awake/alert Behavior During Therapy: WFL for tasks assessed/performed Overall Cognitive Status: Within Functional Limits for tasks assessed                                        General Comments General comments (skin integrity, edema, etc.): Pt on RA with SpO2 91% at rest and with ambulation    Exercises     Assessment/Plan    PT Assessment Patent does not need any further PT services  PT Problem List         PT Treatment Interventions      PT Goals (Current goals can be found in the Care Plan section)  Acute Rehab PT Goals PT Goal Formulation: All assessment and education complete, DC therapy  Frequency     Barriers to discharge        Co-evaluation               AM-PAC PT "6 Clicks" Mobility  Outcome Measure Help needed turning from your back to your side while in a flat bed without using bedrails?: None Help needed moving from lying on your back to sitting on the side of a flat bed without using bedrails?: None Help needed moving to and from a bed to a chair (including a wheelchair)?: None Help needed standing up from a chair using your arms (e.g., wheelchair or bedside chair)?: None Help needed to walk in hospital room?: None Help needed climbing 3-5 steps with a railing? : A Little 6 Click Score: 23    End of Session   Activity Tolerance: Patient tolerated treatment well Patient left: in bed;with call  bell/phone within reach (sitting EOB)   PT Visit Diagnosis: Other abnormalities of gait and mobility (R26.89)    Time: 1743-1800 PT Time Calculation (min) (ACUTE ONLY): 17 min   Charges:   PT Evaluation $PT Eval Low Complexity: 1 Low          [facility] PT Acute Rehabilitation Services Pager [phone] Office [phone]   [name] [facility] 03/11/2021, 6:13 PM

## 2021-03-11 NOTE — Progress Notes (Signed)
TRH night shift PCU coverage note.  The nursing staff reported that this morning's potassium level was resulted at 3.0 mmol/L.  He is getting furosemide 40 mg twice a day.  I have ordered K-Lor 40 mEq p.o. x1 dose now and then K-Dur 40 mEq p.o. daily starting later this morning.  Magnesium and phosphorus level were added to the morning labs.  Sanda Klein, MD.

## 2021-03-11 NOTE — Progress Notes (Signed)
Notified provider about K+ 3.0

## 2021-03-12 ENCOUNTER — Other Ambulatory Visit (HOSPITAL_COMMUNITY): Payer: Self-pay

## 2021-03-12 DIAGNOSIS — E782 Mixed hyperlipidemia: Secondary | ICD-10-CM

## 2021-03-12 DIAGNOSIS — J9601 Acute respiratory failure with hypoxia: Secondary | ICD-10-CM

## 2021-03-12 LAB — CBC
HCT: 37.1 % — ABNORMAL LOW (ref 39.0–52.0)
Hemoglobin: 12.6 g/dL — ABNORMAL LOW (ref 13.0–17.0)
MCH: 29.4 pg (ref 26.0–34.0)
MCHC: 34 g/dL (ref 30.0–36.0)
MCV: 86.5 fL (ref 80.0–100.0)
Platelets: 250 10*3/uL (ref 150–400)
RBC: 4.29 MIL/uL (ref 4.22–5.81)
RDW: 14.6 % (ref 11.5–15.5)
WBC: 8.3 10*3/uL (ref 4.0–10.5)
nRBC: 0 % (ref 0.0–0.2)

## 2021-03-12 LAB — MAGNESIUM: Magnesium: 1.9 mg/dL (ref 1.7–2.4)

## 2021-03-12 LAB — BASIC METABOLIC PANEL
Anion gap: 10 (ref 5–15)
BUN: 17 mg/dL (ref 8–23)
CO2: 28 mmol/L (ref 22–32)
Calcium: 8.8 mg/dL — ABNORMAL LOW (ref 8.9–10.3)
Chloride: 102 mmol/L (ref 98–111)
Creatinine, Ser: 1.4 mg/dL — ABNORMAL HIGH (ref 0.61–1.24)
GFR, Estimated: 51 mL/min — ABNORMAL LOW (ref >60)
Glucose, Bld: 104 mg/dL — ABNORMAL HIGH (ref 70–99)
Potassium: 3.6 mmol/L (ref 3.5–5.1)
Sodium: 140 mmol/L (ref 135–145)

## 2021-03-12 LAB — IRON AND TIBC
Iron: 36 ug/dL — ABNORMAL LOW (ref 45–182)
Saturation Ratios: 11 % — ABNORMAL LOW (ref 17.9–39.5)
TIBC: 319 ug/dL (ref 250–450)
UIBC: 283 ug/dL

## 2021-03-12 LAB — RETICULOCYTES
Immature Retic Fract: 15 % (ref 2.3–15.9)
RBC.: 4.28 MIL/uL (ref 4.22–5.81)
Retic Count, Absolute: 89.5 10*3/uL (ref 19.0–186.0)
Retic Ct Pct: 2.1 % (ref 0.4–3.1)

## 2021-03-12 LAB — VITAMIN B12: Vitamin B-12: 161 pg/mL — ABNORMAL LOW (ref 180–914)

## 2021-03-12 LAB — HEMOGLOBIN A1C
Hgb A1c MFr Bld: 7.1 % — ABNORMAL HIGH (ref 4.8–5.6)
Mean Plasma Glucose: 157 mg/dL

## 2021-03-12 LAB — FOLATE: Folate: 8.7 ng/mL (ref >5.9)

## 2021-03-12 LAB — FERRITIN: Ferritin: 79 ng/mL (ref 24–336)

## 2021-03-12 LAB — GLUCOSE, CAPILLARY
Glucose-Capillary: 153 mg/dL — ABNORMAL HIGH (ref 70–99)
Glucose-Capillary: 137 mg/dL — ABNORMAL HIGH (ref 70–99)

## 2021-03-12 MED ORDER — SACUBITRIL-VALSARTAN 49-51 MG PO TABS
1.0000 | ORAL_TABLET | Freq: Two times a day (BID) | ORAL | 1 refills | Status: DC
  Filled 2021-03-12: qty 60, 30d supply, fill #0

## 2021-03-12 MED ORDER — POTASSIUM CHLORIDE CRYS ER 20 MEQ PO TBCR
20.0000 meq | EXTENDED_RELEASE_TABLET | Freq: Every day | ORAL | 0 refills | Status: DC
  Filled 2021-03-12: qty 30, 30d supply, fill #0

## 2021-03-12 NOTE — Progress Notes (Addendum)
Heart Failure Stewardship Pharmacist Progress Note   PCP: [name], MD PCP-Cardiologist: Nanetta Batty, MD    HPI:  80 yo M w/ PMH of Afib, diastolic CHF, CAD s/p PCI, carotid artery stenosis, COPD, tobacco use, GERD, T2DM, HTN, HLD, PVD, CKD IIIa, hx of medication noncompliance. Presented for evaluation of respiratory distress requiring BiPAP, BP 176/82, BNP 486, CXR showed mild cardiogenic failure. Pt reported worsening SOB, orthopnea, LEE for a few weeks. Reports compliance with Lasix 20mg  daily and other BP meds. ECHO revealed EF 60-65%.   Discharge HF Medications: - Furosemide 20mg  daily - Metoprolol tartrate 25mg  BID - Entresto 49/51mg  BID - Imdur 60mg  daily - Potassium daily  Prior to admission HF Medications: - Furosemide 20mg  daily - Imdur 60mg  daily - Losartan 100mg  daily - Metoprolol tartrate 25mg  BID  Pertinent Lab Values: Serum creatinine 1.4, BUN 17, Potassium 3.6, Sodium 140, BNP 486, Magnesium 1.9, A1c 7.1 Ferritin 79, TSAT 11  Vital Signs: Weight: 233 lbs (admission weight: 236 lbs) Blood pressure: 166/65  Heart rate: 60-70s   Medication Assistance / Insurance Benefits Check: Does the patient have prescription insurance?  Yes Type of insurance plan: BCBS medicare supplement  Outpatient Pharmacy:  Prior to admission outpatient pharmacy: [facility] Is the patient willing to use [facility] TOC pharmacy at discharge? Yes Is the patient willing to transition their outpatient pharmacy to utilize a [facility] outpatient pharmacy?   Pending    Assessment/Plan 1) Acute on chronic diastolic CHF (EF ), due to ICM. NYHA class III symptoms. - Started Entresto 49/51mg  BID. Consider increasing to 97/103 mg BID for further BP reduction if needed.  - Continue furosemide 20mg  daily, Imdur 60mg  daily, metoprolol tartrate 25mg  BID - Consider adding Farxiga and spironolactone at HF TOC follow up  2) Patient assistance: - Pt is in donut hole/coverage gap.  Normal co-pay unknown. Patient assistance can be obtained if needed. Copay information below pertains to copay while in donut hole.  - Farxiga $148.27 - Jardiance $154   ) Lucian Baswell, PharmD Student

## 2021-03-12 NOTE — Discharge Summary (Signed)
Physician Discharge Summary  [name] DOB: August 16, 1941 DOA: 03/10/2021  PCP: Albert Ishihara, MD  Admit date: 03/10/2021 Discharge date: 03/12/2021  Admitted From: Home Disposition:  Home  Recommendations for Outpatient Follow-up:  Follow up with PCP in 1-2 weeks Please obtain BMP/CBC in one week Please follow up with cardiology as scheduled  Home Health:None  Equipment/Devices:None  Discharge Condition:Stable  CODE STATUS: DNR  Diet recommendation: Low salt    Brief/Interim Summary: 80 year old with a history of paroxysmal atrial fibrillation on chronic Eliquis, noncompliance, chronic diastolic CHF, CAD status post PCI/stenting, status post CEA, COPD, GERD, DM2, HTN, HLD, PVD, CKD stage III, AAA, and tobacco abuse who presented to the ED with severe shortness of breath.  He was found to be saturating in the 70s on room air and initially required CPAP.  EKG was without acute ischemic change.  CXR was suggestive of mild pulmonary edema and a small right pleural effusion.  Patient admitted as above with acute hypoxic respiratory failure in the setting of acute diastolic heart failure. Patient diuresed well, now euvolemic and back to baseline. HTN well controlled now. Likely due to noncompliance with medications and dietary restrictions.     Discharge Diagnoses:  Principal Problem:   CHF exacerbation ([name]) Active Problems:   Type 2 diabetes mellitus ([name])   Hyperlipidemia   Acute hypoxemic respiratory failure ([name])   Uncontrolled hypertension    Discharge Instructions  Discharge Instructions     Call MD for:  difficulty breathing, headache or visual disturbances   Complete by: As directed    Call MD for:  persistant dizziness or light-headedness   Complete by: As directed    Diet - low sodium heart healthy   Complete by: As directed    Increase activity slowly   Complete by: As directed       Allergies as of 03/12/2021       Reactions    Fentanyl Other (See Comments)   Behavioral changes   Gabapentin Other (See Comments)   ankle swells   Lisinopril Other (See Comments)   unknown   Lyrica [pregabalin] Other (See Comments)   unknown   Metformin Diarrhea   Propofol Other (See Comments)   Heart rate dropped        Medication List     STOP taking these medications    losartan 100 MG tablet Commonly known as: COZAAR   losartan 50 MG tablet Commonly known as: COZAAR       TAKE these medications    amLODipine 5 MG tablet Commonly known as: NORVASC Take 2 tablets (10 mg total) by mouth daily.   apixaban 5 MG Tabs tablet Commonly known as: ELIQUIS Take 1 tablet (5 mg total) by mouth 2 (two) times daily.   atorvastatin 40 MG tablet Commonly known as: LIPITOR Take 40 mg by mouth at bedtime.   clopidogrel 75 MG tablet Commonly known as: PLAVIX Take 1 tablet (75 mg total) by mouth daily.   Entresto 49-51 MG Generic drug: sacubitril-valsartan Take 1 tablet by mouth 2 (two) times daily.   escitalopram 20 MG tablet Commonly known as: LEXAPRO Take 20 mg by mouth daily.   furosemide 20 MG tablet Commonly known as: Lasix Take 1 tablet (20 mg total) by mouth daily.   glipiZIDE 5 MG tablet Commonly known as: GLUCOTROL Take 5 mg by mouth daily.   isosorbide mononitrate 60 MG 24 hr tablet Commonly known as: IMDUR Take 1 tablet (60 mg total) by mouth daily.  metoprolol tartrate 25 MG tablet Commonly known as: LOPRESSOR Take 1 tablet (25 mg total) by mouth 2 (two) times daily.   Ozempic (1 MG/DOSE) 2 MG/1.5ML Sopn Generic drug: Semaglutide (1 MG/DOSE) Inject 2.5 mg into the skin once a week.   pantoprazole 40 MG tablet Commonly known as: PROTONIX Take 40 mg by mouth daily.   potassium chloride SA 20 MEQ tablet Commonly known as: KLOR-CON Take 1 tablet (20 mEq total) by mouth daily. Start taking on: March 13, 2021   SYSTANE FREE OP Place 1 drop into both eyes daily as needed (dry eyes).         Allergies  Allergen Reactions   Fentanyl Other (See Comments)    Behavioral changes   Gabapentin Other (See Comments)    ankle swells   Lisinopril Other (See Comments)    unknown   Lyrica [Pregabalin] Other (See Comments)    unknown   Metformin Diarrhea   Propofol Other (See Comments)    Heart rate dropped    Consultations: None  Procedures/Studies: DG Chest Port 1 View  Result Date: 03/10/2021 CLINICAL DATA:  Dyspnea, EXAM: PORTABLE CHEST 1 VIEW COMPARISON:  01/01/2021 FINDINGS: Mild to moderate bilateral perihilar interstitial pulmonary infiltrate is present most in keeping with mild cardiogenic failure. Small right pleural effusion is present contributing to right basilar opacification. No pneumothorax. Mild cardiomegaly is stable. Dorsal column stimulator leads noted. IMPRESSION: Mild cardiogenic failure. Small right pleural effusion. Stable cardiomegaly. Electronically Signed   By: Helyn Numbers MD   On: 03/10/2021 05:27   ECHOCARDIOGRAM COMPLETE  Result Date: 03/10/2021    ECHOCARDIOGRAM REPORT   Patient Name:   Albert Powell Date of Exam: 03/10/2021 Medical Rec #:  [id number]        Height:       69.0 in Accession #:    [id number]       Weight:       224.2 lb Date of Birth:  02-Apr-1941        BSA:          2.169 m Patient Age:    79 years         BP:           165/95 mmHg Patient Gender: M                HR:           66 bpm. Exam Location:  Inpatient Procedure: 2D Echo, Cardiac Doppler, Color Doppler and Intracardiac            Opacification Agent Indications:    I50.31 Acute diastolic (congestive) heart failure  History:        Patient has prior history of Echocardiogram examinations, most                 recent 03/30/2020. Shortness of breath for the last two days on                 3L room O2.  Sonographer:    Roosvelt Maser RDCS Referring Phys: [id number] VASUNDHRA RATHORE IMPRESSIONS  1. Left ventricular ejection fraction, by estimation, is 60 to 65%. The left ventricle  has normal function. The left ventricle has no regional wall motion abnormalities. There is mild concentric left ventricular hypertrophy. Left ventricular diastolic parameters are consistent with Grade I diastolic dysfunction (impaired relaxation).  2. Right ventricular systolic function is normal. The right ventricular size is normal. Tricuspid regurgitation signal is inadequate for assessing PA pressure.  3. A small pericardial effusion is present. The pericardial effusion is circumferential. There is no evidence of cardiac tamponade.  4. The mitral valve is grossly normal. No evidence of mitral valve regurgitation. No evidence of mitral stenosis.  5. The aortic valve is tricuspid. There is mild calcification of the aortic valve. Aortic valve regurgitation is not visualized. Mild aortic valve sclerosis is present, with no evidence of aortic valve stenosis.  6. The inferior vena cava is normal in size with greater than 50% respiratory variability, suggesting right atrial pressure of 3 mmHg. Comparison(s): No significant change from prior study. FINDINGS  Left Ventricle: Left ventricular ejection fraction, by estimation, is 60 to 65%. The left ventricle has normal function. The left ventricle has no regional wall motion abnormalities. Definity contrast agent was given IV to delineate the left ventricular  endocardial borders. The left ventricular internal cavity size was normal in size. There is mild concentric left ventricular hypertrophy. Left ventricular diastolic parameters are consistent with Grade I diastolic dysfunction (impaired relaxation). Right Ventricle: The right ventricular size is normal. No increase in right ventricular wall thickness. Right ventricular systolic function is normal. Tricuspid regurgitation signal is inadequate for assessing PA pressure. Left Atrium: Left atrial size was normal in size. Right Atrium: Right atrial size was normal in size. Pericardium: A small pericardial effusion is  present. The pericardial effusion is circumferential. There is no evidence of cardiac tamponade. Mitral Valve: The mitral valve is grossly normal. Mild to moderate mitral annular calcification. No evidence of mitral valve regurgitation. No evidence of mitral valve stenosis. Tricuspid Valve: The tricuspid valve is grossly normal. Tricuspid valve regurgitation is not demonstrated. No evidence of tricuspid stenosis. Aortic Valve: The aortic valve is tricuspid. There is mild calcification of the aortic valve. Aortic valve regurgitation is not visualized. Mild aortic valve sclerosis is present, with no evidence of aortic valve stenosis. Aortic valve mean gradient measures 4.0 mmHg. Aortic valve peak gradient measures 7.6 mmHg. Aortic valve area, by VTI measures 2.24 cm. Pulmonic Valve: The pulmonic valve was grossly normal. Pulmonic valve regurgitation is not visualized. No evidence of pulmonic stenosis. Aorta: The aortic root is normal in size and structure. Venous: The inferior vena cava is normal in size with greater than 50% respiratory variability, suggesting right atrial pressure of 3 mmHg. IAS/Shunts: The atrial septum is grossly normal.  LEFT VENTRICLE PLAX 2D LVIDd:         4.70 cm  Diastology LV PW:         1.30 cm  LV e' medial:    6.09 cm/s LV IVS:        1.30 cm  LV E/e' medial:  14.4 LVOT diam:     2.10 cm  LV e' lateral:   9.25 cm/s LV SV:         65       LV E/e' lateral: 9.5 LV SV Index:   30 LVOT Area:     3.46 cm  RIGHT VENTRICLE RV Basal diam:  4.20 cm RV Mid diam:    3.20 cm LEFT ATRIUM           Index       RIGHT ATRIUM           Index LA diam:      4.50 cm 2.07 cm/m  RA Area:     19.00 cm LA Vol (A2C): 56.5 ml 26.05 ml/m RA Volume:   47.20 ml  21.76 ml/m LA Vol (A4C): 59.8 ml 27.57  ml/m  AORTIC VALVE AV Area (Vmax):    2.47 cm AV Area (Vmean):   2.33 cm AV Area (VTI):     2.24 cm AV Vmax:           138.00 cm/s AV Vmean:          91.800 cm/s AV VTI:            0.292 m AV Peak Grad:       7.6 mmHg AV Mean Grad:      4.0 mmHg LVOT Vmax:         98.50 cm/s LVOT Vmean:        61.800 cm/s LVOT VTI:          0.189 m LVOT/AV VTI ratio: 0.65  AORTA Ao Root diam: 3.30 cm MITRAL VALVE MV Area (PHT): 2.69 cm    SHUNTS MV Decel Time: 282 msec    Systemic VTI:  0.19 m MV E velocity: 88.00 cm/s  Systemic Diam: 2.10 cm MV A velocity: 89.10 cm/s MV E/A ratio:  0.99 Lennie OdorWesley O'Neal MD Electronically signed by Lennie OdorWesley O'Neal MD Signature Date/Time: 03/10/2021/11:26:37 AM    Final      Subjective: No acute issues/events overnight   Discharge Exam: Vitals:   03/12/21 0500 03/12/21 0811  BP: (!) 164/62 (!) 166/65  Pulse: 64   Resp: 20 20  Temp: 98.5 F (36.9 C) 98.2 F (36.8 C)  SpO2: 91% 92%   Vitals:   03/11/21 0848 03/11/21 2149 03/12/21 0500 03/12/21 0811  BP: (!) 181/70 (!) 152/65 (!) 164/62 (!) 166/65  Pulse: 77 66 64   Resp:   20 20  Temp:  98.6 F (37 C) 98.5 F (36.9 C) 98.2 F (36.8 C)  TempSrc:  Oral Oral Oral  SpO2: 95% 90% 91% 92%  Weight:   105.8 kg   Height:        General: Pt is alert, awake, not in acute distress Cardiovascular: RRR, S1/S2 +, no rubs, no gallops Respiratory: CTA bilaterally, no wheezing, no rhonchi Abdominal: Soft, NT, ND, bowel sounds + Extremities: no edema, no cyanosis    The results of significant diagnostics from this hospitalization (including imaging, microbiology, ancillary and laboratory) are listed below for reference.     Microbiology: Recent Results (from the past 240 hour(s))  Resp Panel by RT-PCR (Flu A&B, Covid) Nasopharyngeal Swab     Status: None   Collection Time: 03/10/21  3:08 AM   Specimen: Nasopharyngeal Swab; Nasopharyngeal(NP) swabs in vial transport medium  Result Value Ref Range Status   SARS Coronavirus 2 by RT PCR NEGATIVE NEGATIVE Final    Comment: (NOTE) SARS-CoV-2 target nucleic acids are NOT DETECTED.  The SARS-CoV-2 RNA is generally detectable in upper respiratory specimens during the acute phase of  infection. The lowest concentration of SARS-CoV-2 viral copies this assay can detect is 138 copies/mL. A negative result does not preclude SARS-Cov-2 infection and should not be used as the sole basis for treatment or other patient management decisions. A negative result may occur with  improper specimen collection/handling, submission of specimen other than nasopharyngeal swab, presence of viral mutation(s) within the areas targeted by this assay, and inadequate number of viral copies(<138 copies/mL). A negative result must be combined with clinical observations, patient history, and epidemiological information. The expected result is Negative.  Fact Sheet for Patients:  BloggerCourse.comhttps://www.fda.gov/media/152166/download  Fact Sheet for Healthcare Providers:  SeriousBroker.ithttps://www.fda.gov/media/152162/download  This test is no t yet approved or cleared by the [place] FDA  and  has been authorized for detection and/or diagnosis of SARS-CoV-2 by FDA under an Emergency Use Authorization (EUA). This EUA will remain  in effect (meaning this test can be used) for the duration of the COVID-19 declaration under Section 564(b)(1) of the Act, 21 U.S.C.section 360bbb-3(b)(1), unless the authorization is terminated  or revoked sooner.       Influenza A by PCR NEGATIVE NEGATIVE Final   Influenza B by PCR NEGATIVE NEGATIVE Final    Comment: (NOTE) The Xpert Xpress SARS-CoV-2/FLU/RSV plus assay is intended as an aid in the diagnosis of influenza from Nasopharyngeal swab specimens and should not be used as a sole basis for treatment. Nasal washings and aspirates are unacceptable for Xpert Xpress SARS-CoV-2/FLU/RSV testing.  Fact Sheet for Patients: BloggerCourse.com  Fact Sheet for Healthcare Providers: SeriousBroker.it  This test is not yet approved or cleared by the [place] FDA and has been authorized for detection and/or diagnosis of SARS-CoV-2  by FDA under an Emergency Use Authorization (EUA). This EUA will remain in effect (meaning this test can be used) for the duration of the COVID-19 declaration under Section 564(b)(1) of the Act, 21 U.S.C. section 360bbb-3(b)(1), unless the authorization is terminated or revoked.  Performed at [facility], [address] [place], [place] [address]      Labs: BNP (last 3 results) Recent Labs    03/30/20 1251 01/01/21 1350 03/10/21 0307  BNP 199.3* 1,024.5* 486.9*   Basic Metabolic Panel: Recent Labs  Lab 03/10/21 0307 03/11/21 0221 03/12/21 0257  NA 137 137 140  K 3.6 3.0* 3.6  CL 105 99 102  CO2 24 27 28   GLUCOSE 143* 106* 104*  BUN 15 18 17   CREATININE 1.27* 1.33* 1.40*  CALCIUM 9.1 8.6* 8.8*  MG  --  1.9 1.9  PHOS  --  3.3  --    Liver Function Tests: Recent Labs  Lab 03/10/21 0307  AST 18  ALT 19  ALKPHOS 84  BILITOT 0.7  PROT 6.5  ALBUMIN 3.4*   No results for input(s): LIPASE, AMYLASE in the last 168 hours. No results for input(s): AMMONIA in the last 168 hours. CBC: Recent Labs  Lab 03/10/21 0307 03/11/21 0221 03/12/21 0257  WBC 12.0* 8.4 8.3  NEUTROABS 9.6*  --   --   HGB 12.9* 11.6* 12.6*  HCT 38.8* 35.3* 37.1*  MCV 87.8 87.4 86.5  PLT 288 237 250   Cardiac Enzymes: No results for input(s): CKTOTAL, CKMB, CKMBINDEX, TROPONINI in the last 168 hours. BNP: Invalid input(s): POCBNP CBG: Recent Labs  Lab 03/11/21 1205 03/11/21 1635 03/11/21 2205 03/12/21 0748 03/12/21 1215  GLUCAP 161* 103* 223* 153* 137*   D-Dimer No results for input(s): DDIMER in the last 72 hours. Hgb A1c Recent Labs    03/10/21 0637  HGBA1C 7.1*   Lipid Profile No results for input(s): CHOL, HDL, LDLCALC, TRIG, CHOLHDL, LDLDIRECT in the last 72 hours. Thyroid function studies No results for input(s): TSH, T4TOTAL, T3FREE, THYROIDAB in the last 72 hours.  Invalid input(s): FREET3 Anemia work up Recent Labs    03/12/21 0257  VITAMINB12 161*   FOLATE 8.7  FERRITIN 79  TIBC 319  IRON 36*  RETICCTPCT 2.1   Urinalysis    Component Value Date/Time   COLORURINE YELLOW 01/01/2021 1351   APPEARANCEUR CLEAR 01/01/2021 1351   LABSPEC 1.009 01/01/2021 1351   PHURINE 6.0 01/01/2021 1351   GLUCOSEU >=500 (A) 01/01/2021 1351   HGBUR SMALL (A) 01/01/2021 1351  BILIRUBINUR NEGATIVE 01/01/2021 1351   KETONESUR NEGATIVE 01/01/2021 1351   PROTEINUR >=300 (A) 01/01/2021 1351   UROBILINOGEN 1.0 05/31/2013 1432   NITRITE NEGATIVE 01/01/2021 1351   LEUKOCYTESUR NEGATIVE 01/01/2021 1351   Sepsis Labs Invalid input(s): PROCALCITONIN,  WBC,  LACTICIDVEN Microbiology Recent Results (from the past 240 hour(s))  Resp Panel by RT-PCR (Flu A&B, Covid) Nasopharyngeal Swab     Status: None   Collection Time: 03/10/21  3:08 AM   Specimen: Nasopharyngeal Swab; Nasopharyngeal(NP) swabs in vial transport medium  Result Value Ref Range Status   SARS Coronavirus 2 by RT PCR NEGATIVE NEGATIVE Final    Comment: (NOTE) SARS-CoV-2 target nucleic acids are NOT DETECTED.  The SARS-CoV-2 RNA is generally detectable in upper respiratory specimens during the acute phase of infection. The lowest concentration of SARS-CoV-2 viral copies this assay can detect is 138 copies/mL. A negative result does not preclude SARS-Cov-2 infection and should not be used as the sole basis for treatment or other patient management decisions. A negative result may occur with  improper specimen collection/handling, submission of specimen other than nasopharyngeal swab, presence of viral mutation(s) within the areas targeted by this assay, and inadequate number of viral copies(<138 copies/mL). A negative result must be combined with clinical observations, patient history, and epidemiological information. The expected result is Negative.  Fact Sheet for Patients:  BloggerCourse.com  Fact Sheet for Healthcare Providers:   SeriousBroker.it  This test is no t yet approved or cleared by the [place] FDA and  has been authorized for detection and/or diagnosis of SARS-CoV-2 by FDA under an Emergency Use Authorization (EUA). This EUA will remain  in effect (meaning this test can be used) for the duration of the COVID-19 declaration under Section 564(b)(1) of the Act, 21 U.S.C.section 360bbb-3(b)(1), unless the authorization is terminated  or revoked sooner.       Influenza A by PCR NEGATIVE NEGATIVE Final   Influenza B by PCR NEGATIVE NEGATIVE Final    Comment: (NOTE) The Xpert Xpress SARS-CoV-2/FLU/RSV plus assay is intended as an aid in the diagnosis of influenza from Nasopharyngeal swab specimens and should not be used as a sole basis for treatment. Nasal washings and aspirates are unacceptable for Xpert Xpress SARS-CoV-2/FLU/RSV testing.  Fact Sheet for Patients: BloggerCourse.com  Fact Sheet for Healthcare Providers: SeriousBroker.it  This test is not yet approved or cleared by the [place] FDA and has been authorized for detection and/or diagnosis of SARS-CoV-2 by FDA under an Emergency Use Authorization (EUA). This EUA will remain in effect (meaning this test can be used) for the duration of the COVID-19 declaration under Section 564(b)(1) of the Act, 21 U.S.C. section 360bbb-3(b)(1), unless the authorization is terminated or revoked.  Performed at [facility], [address] [place], [place] [address]      Time coordinating discharge: Over 30 minutes  SIGNED:   Azucena Fallen, DO [facility] 03/12/2021, 7:30 PM Pager   If 7PM-7AM, please contact night-coverage [email or web address]

## 2021-03-12 NOTE — Progress Notes (Signed)
Pt is alert and oriented. Discharge instructions/ AVS given to pt. Pt instructed to get the medications from [facility] pharmacy  going to exit. 

## 2021-03-19 DIAGNOSIS — R0600 Dyspnea, unspecified: Secondary | ICD-10-CM | POA: Diagnosis not present

## 2021-03-19 DIAGNOSIS — E1159 Type 2 diabetes mellitus with other circulatory complications: Secondary | ICD-10-CM | POA: Diagnosis not present

## 2021-03-19 DIAGNOSIS — I10 Essential (primary) hypertension: Secondary | ICD-10-CM | POA: Diagnosis not present

## 2021-03-19 DIAGNOSIS — I5033 Acute on chronic diastolic (congestive) heart failure: Secondary | ICD-10-CM | POA: Diagnosis not present

## 2021-03-19 DIAGNOSIS — Z7984 Long term (current) use of oral hypoglycemic drugs: Secondary | ICD-10-CM | POA: Diagnosis not present

## 2021-03-19 NOTE — Progress Notes (Signed)
[facility]  PCP: [name] Primary Cardiologist: Jeri Cos  HPI:  Albert Powell is a 80 y.o.  male  with a PMH significant for PAF, CAD s/p stenting x2 to RCA in 2003 with residual LAD disease, carotid artery disease s/p carotid artery stenosis s/p LICA endarterectomy 04/2013, peripheral arterial disease, hypertension, hyperlipidemia, T2DM, renal artery stenosis.  2003 required PCI x2 to RCA   He has required vascular interventions at multiple sites including LICA  endarterectomy 2014, right external iliac artery atherectomy and stenting 2015, repeat angiography 2019 with bilateral iliac bifurcation VBX stents placed 2019.  03/2020 developed afib w/rvr and chest pain and went for repeat LHC which demonstrated patent proximal RCA stent with an intermediate lesion in the 70% range with negative DFR just beyond this in the mid RCA.  Otherwise noted to have noncritical CAD.   Last PV angiogram 08/2020 with patent VBX stents.  Report also mentions high-grade exophytic calcific disease in his distal right external neck artery and right common femoral artery which will need to be treated surgically with endarterectomy and patch angioplasty.  He also has a high-grade exophytic calcific plaque in his mid left SFA which can be treated with orbital atherectomy plus or minus drug-coated balloon angioplasty plus or minus stenting.  Planned to be done outpatient.  Admission on April 2022 for hypertensive emergency, flash pulmonary edema, A. fib with RVR due to poor medication adherence.  Home meds were restarted, given one dose of IV lasix 40mg  and he returned back to baseline.    Recently admitted 03/10/21 with acute on chronic diastolic CHF.  He reported several weeks of worsening dyspnea.  He called EMS when they arrived pulse oximetry noted to be in the 70's.  He was placed on cpap.  Blood pressure 176/82 on arrival.  Transitioned to BiPAP  which he tolerated well.  BNP elevated at 486. Bilateral interstitial edema in a pattern consistent with CHF on chest x-ray.  Ecg was NSR rate 83 no ischemia, troponin normal x2. He had a repeat ECHO with EF 60-65%, G1DD, small pericardial effusion.  He was started on IV lasix.  Wt trend 236>233lbs.  Discharged on lasix 20mg , entresto 49/51, metoprolol tartrate 25 BID, amlodipine 10, IMDUR 60.    Since hospitalization has been feeling better, still having dyspnea on exertion with long walks.  Denies chest pain, orthopnea, no PND.  Weight at home has been stable at 229-230lbs.  BP has been good at home averaging around 130 systolic but not checking often.    ROS: All systems negative except as listed in HPI, PMH and Problem List.  SH:  Social History   Socioeconomic History   Marital status: Married    Spouse name: Not on file   Number of children: Not on file   Years of education: Not on file   Highest education level: Not on file  Occupational History   Occupation: retired  Tobacco Use   Smoking status: Every Day    Packs/day: 0.75    Years: 57.00    Pack years: 42.75    Types: Cigarettes    Last attempt to quit: 04/06/2013    Years since quitting: 7.9   Smokeless tobacco: Former    Quit date: 04/14/2013   Tobacco comments:    pt states that he is using the vapor cigs  Vaping Use   Vaping Use: Never used  Substance and Sexual Activity   Alcohol use: Yes  Alcohol/week: 1.0 standard drink    Types: 1 Glasses of wine per week    Comment: "very little"   Drug use: No   Sexual activity: Not on file  Other Topics Concern   Not on file  Social History Narrative   Not on file   Social Determinants of Health   Financial Resource Strain: Low Risk    Difficulty of Paying Living Expenses: Not very hard  Food Insecurity: No Food Insecurity   Worried About Running Out of Food in the Last Year: Never true   Ran Out of Food in the Last Year: Never true  Transportation Needs: No  Transportation Needs   Lack of Transportation (Medical): No   Lack of Transportation (Non-Medical): No  Physical Activity: Not on file  Stress: Not on file  Social Connections: Not on file  Intimate Partner Violence: Not on file    FH:  Family History  Problem Relation Age of Onset   Heart disease Father        heart attack at 28   Heart attack Father    Hyperlipidemia Father    Colonic polyp Mother        that bled out   COPD Mother    Diabetes Mother    Heart disease Sister    Colonic polyp Sister    Stroke Maternal Grandfather    Heart attack Paternal Grandfather    Other Neg Hx        hypogonadism    Past Medical History:  Diagnosis Date   Atrial fibrillation ([name]) 03/30/2020   Bradycardia, sinus 02/18/2013   CAD (coronary artery disease)    stent to RCA 2003 also had 40% lesion at that time; NUCLEAR STRESS TEST, 01/16/2010 - normal  now with cath 80-90% stenosis in LAD, will try medical therapy if no improvement PCI   Carotid artery disease ([name])    Cataract    Claudication ([name])    LEA DUPLEX, 07/07/2008 - Normal   COPD (chronic obstructive pulmonary disease) ([name])    DDD (degenerative disc disease) 2008   Diabetes mellitus    GERD (gastroesophageal reflux disease)    H/O hiatal hernia    History of colonoscopy 2004   finding of tics and AMV only no polpys   Hyperlipidemia 02/05/2013   Hypertension    Onychomycosis    Rosacea    Tobacco abuse 02/05/2013    Current Outpatient Medications  Medication Sig Dispense Refill   apixaban (ELIQUIS) 5 MG TABS tablet Take 1 tablet (5 mg total) by mouth 2 (two) times daily. 60 tablet 2   atorvastatin (LIPITOR) 40 MG tablet Take 40 mg by mouth at bedtime.      clopidogrel (PLAVIX) 75 MG tablet Take 1 tablet (75 mg total) by mouth daily. 30 tablet 1   dapagliflozin propanediol (FARXIGA) 10 MG TABS tablet Take 1 tablet (10 mg total) by mouth daily before breakfast. 30 tablet 2   escitalopram (LEXAPRO) 20 MG tablet Take 20 mg  by mouth daily.     isosorbide mononitrate (IMDUR) 60 MG 24 hr tablet Take 1 tablet (60 mg total) by mouth daily. 30 tablet 1   metoprolol tartrate (LOPRESSOR) 25 MG tablet Take 1 tablet (25 mg total) by mouth 2 (two) times daily. 60 tablet 1   OZEMPIC, 1 MG/DOSE, 2 MG/1.5ML SOPN Inject 2.5 mg into the skin once a week.      pantoprazole (PROTONIX) 40 MG tablet Take 40 mg by mouth daily.  Polyethyl Glycol-Propyl Glycol (SYSTANE FREE OP) Place 1 drop into both eyes daily as needed (dry eyes).     sacubitril-valsartan (ENTRESTO) 49-51 MG Take 1 tablet by mouth 2 (two) times daily. 60 tablet 1   spironolactone (ALDACTONE) 25 MG tablet Take 0.5 tablets (12.5 mg total) by mouth daily. 90 tablet 0   amLODipine (NORVASC) 5 MG tablet Take 1 tablet (5 mg total) by mouth daily. 60 tablet 2   furosemide (LASIX) 20 MG tablet Take 1 tablet (20 mg total) by mouth daily as needed. 30 tablet 1   Current Facility-Administered Medications  Medication Dose Route Frequency Provider Last Rate Last Admin   sodium chloride flush (NS) 0.9 % injection 3 mL  3 mL Intravenous Q12H [name], MD        Vitals:   03/21/21 1508  BP: 120/70  Pulse: 63  SpO2: 96%  Weight: 104.2 kg (229 lb 12.8 oz)    PHYSICAL EXAM: Cardiac: JVD flat, normal rate and rhythm, clear s1 and s2, no murmurs, rubs or gallops, 2+ LE edema Pulmonary: CTAB, not in distress Abdominal: non distended abdomen, soft and nontender Psych: Alert, conversant, in good spirits   ASSESSMENT & PLAN: Chronic Diastolic CHF -02/2021 ECHO EF 60-65%, G1DD, small pericardial effusion -NYHA Class III symptoms, he is euvolemic on exam, does have LE edema but this is chronic with venous insufficiency, also on amlodipine 10mg  -switch lasix to PRN -continue metoprolol tartrate 25 Bid, IMDUR 60, Entresto 49/51 -Decrease amlodipine to 5mg  -start spiro 12.5mg  daily -stop potassium supplement -start farxiga 10 -bmp today and in one week  CAD: -hx  outlined above, most recent [facility] 03/2020 patent proximal RCA stent with an intermediate lesion in the 70% range with negative DFR just beyond this in the mid RCA.  Otherwise noted to have noncritical CAD.  -no s/s of angina, no bleeding -continue eliquis, plavix, statin, imdur, bb  T2DM: -well controlled  -stop glipizide -start farxiga   PAF: -continue eliquis, metoprolol  CKD3a: -repeat bmp today and in one week -add spiro and farxiga as above  PAD: -continue statin, eliquis, plavis   Medication affordability has been a big issue, see pharmacy note about strategies to keep medications affordable to improve adherence  Repeat lab visit here in one week, then follow up with [facility] in about one month

## 2021-03-20 ENCOUNTER — Telehealth (HOSPITAL_COMMUNITY): Payer: Self-pay | Admitting: Surgery

## 2021-03-20 NOTE — Telephone Encounter (Signed)
Patient called and reminded of scheduled appt in the [facility].  He tells me that he plans to attend appt.

## 2021-03-21 ENCOUNTER — Ambulatory Visit (HOSPITAL_COMMUNITY)
Admit: 2021-03-21 | Discharge: 2021-03-21 | Disposition: A | Discharge status: Home / Self Care | Payer: Medicare Other | Source: Ambulatory Visit | Attending: Internal Medicine | Admitting: Internal Medicine

## 2021-03-21 ENCOUNTER — Other Ambulatory Visit: Payer: Self-pay

## 2021-03-21 ENCOUNTER — Encounter (HOSPITAL_COMMUNITY): Payer: Self-pay

## 2021-03-21 VITALS — BP 120/70 | HR 63 | Wt 229.8 lb

## 2021-03-21 DIAGNOSIS — Z955 Presence of coronary angioplasty implant and graft: Secondary | ICD-10-CM | POA: Diagnosis not present

## 2021-03-21 DIAGNOSIS — I130 Hypertensive heart and chronic kidney disease with heart failure and stage 1 through stage 4 chronic kidney disease, or unspecified chronic kidney disease: Secondary | ICD-10-CM | POA: Diagnosis not present

## 2021-03-21 DIAGNOSIS — I5033 Acute on chronic diastolic (congestive) heart failure: Secondary | ICD-10-CM | POA: Insufficient documentation

## 2021-03-21 DIAGNOSIS — Z7984 Long term (current) use of oral hypoglycemic drugs: Secondary | ICD-10-CM | POA: Insufficient documentation

## 2021-03-21 DIAGNOSIS — E1151 Type 2 diabetes mellitus with diabetic peripheral angiopathy without gangrene: Secondary | ICD-10-CM | POA: Diagnosis not present

## 2021-03-21 DIAGNOSIS — Z79899 Other long term (current) drug therapy: Secondary | ICD-10-CM | POA: Diagnosis not present

## 2021-03-21 DIAGNOSIS — R0609 Other forms of dyspnea: Secondary | ICD-10-CM | POA: Diagnosis not present

## 2021-03-21 DIAGNOSIS — Z7901 Long term (current) use of anticoagulants: Secondary | ICD-10-CM | POA: Diagnosis not present

## 2021-03-21 DIAGNOSIS — I2510 Atherosclerotic heart disease of native coronary artery without angina pectoris: Secondary | ICD-10-CM | POA: Diagnosis not present

## 2021-03-21 DIAGNOSIS — Z794 Long term (current) use of insulin: Secondary | ICD-10-CM

## 2021-03-21 DIAGNOSIS — N1831 Chronic kidney disease, stage 3a: Secondary | ICD-10-CM | POA: Insufficient documentation

## 2021-03-21 DIAGNOSIS — Z7902 Long term (current) use of antithrombotics/antiplatelets: Secondary | ICD-10-CM | POA: Diagnosis not present

## 2021-03-21 DIAGNOSIS — I5032 Chronic diastolic (congestive) heart failure: Secondary | ICD-10-CM | POA: Diagnosis not present

## 2021-03-21 DIAGNOSIS — E1122 Type 2 diabetes mellitus with diabetic chronic kidney disease: Secondary | ICD-10-CM | POA: Diagnosis not present

## 2021-03-21 DIAGNOSIS — E1136 Type 2 diabetes mellitus with diabetic cataract: Secondary | ICD-10-CM | POA: Diagnosis not present

## 2021-03-21 DIAGNOSIS — F17210 Nicotine dependence, cigarettes, uncomplicated: Secondary | ICD-10-CM | POA: Diagnosis not present

## 2021-03-21 DIAGNOSIS — F17290 Nicotine dependence, other tobacco product, uncomplicated: Secondary | ICD-10-CM | POA: Insufficient documentation

## 2021-03-21 DIAGNOSIS — I480 Paroxysmal atrial fibrillation: Secondary | ICD-10-CM | POA: Diagnosis not present

## 2021-03-21 LAB — BASIC METABOLIC PANEL
Anion gap: 8 (ref 5–15)
BUN: 14 mg/dL (ref 8–23)
CO2: 27 mmol/L (ref 22–32)
Calcium: 9.3 mg/dL (ref 8.9–10.3)
Chloride: 101 mmol/L (ref 98–111)
Creatinine, Ser: 1.28 mg/dL — ABNORMAL HIGH (ref 0.61–1.24)
GFR, Estimated: 57 mL/min — ABNORMAL LOW (ref >60)
Glucose, Bld: 140 mg/dL — ABNORMAL HIGH (ref 70–99)
Potassium: 4.4 mmol/L (ref 3.5–5.1)
Sodium: 136 mmol/L (ref 135–145)

## 2021-03-21 MED ORDER — FUROSEMIDE 20 MG PO TABS: 20.0000 mg | ORAL_TABLET | Freq: Every day | ORAL | 1 refills | Status: DC | PRN

## 2021-03-21 MED ORDER — AMLODIPINE BESYLATE 5 MG PO TABS: 5.0000 mg | ORAL_TABLET | Freq: Every day | ORAL | 2 refills | Status: DC

## 2021-03-21 MED ORDER — SPIRONOLACTONE 25 MG PO TABS: 12.5000 mg | ORAL_TABLET | Freq: Every day | ORAL | 0 refills | Status: DC

## 2021-03-21 MED ORDER — DAPAGLIFLOZIN PROPANEDIOL 10 MG PO TABS: 10.0000 mg | ORAL_TABLET | Freq: Every day | ORAL | 2 refills | Status: DC

## 2021-03-21 MED ORDER — APIXABAN 5 MG PO TABS: 5.0000 mg | ORAL_TABLET | Freq: Two times a day (BID) | ORAL | 2 refills | Status: DC

## 2021-03-21 NOTE — Progress Notes (Signed)
Medication Samples have been provided to the patient.  Drug name: Eliquis       Strength: 5 mg        Qty: 2  LOT: [id number]  Exp.Date: 03/2023  Dosing instructions: take 1 tablet Twice daily   The patient has been instructed regarding the correct time, dose, and frequency of taking this medication, including desired effects and most common side effects.   Albert Powell 4:16 PM 03/21/2021

## 2021-03-21 NOTE — Patient Instructions (Addendum)
Continue Eliquis 5 mg  Twice daily  Restart Farxiga 10 mg daily  Change Lasix to as needed daily  DECREASE Amlodipine to 5 mg daily  START Spironolactone 12.5 mg ( 1/2 tablet) daily  STOP Glipizide  STOP Potassium  Labs done today, your results will be available in MyChart, we will contact you for abnormal readings.  Your physician recommends that you schedule a follow-up lab  appointment in: 1 week  Follow up with [facility]

## 2021-03-21 NOTE — Progress Notes (Signed)
Heart and Vascular Care Navigation  03/21/2021  Albert Powell [birth year] [id number]  Reason for Referral: Patient seen in HF TOC.   Engaged with patient face to face for initial visit for Heart and Vascular Care Coordination.                                                                                                   Assessment:  Patient is a 80 yo married male. Patient and his wife live in a first floor apartment and deny any concerns with housing. Patient's wife reports she drives patient to all medical appointments and they have a supportive daughter although she has some medical concerns as well.  Patient has medicare and his retirement income. Patient shared that he is currently in the donut hole for his medications and it has become quite overwhelming.  Patient states his only concern is needed some medication assistance.                                HRT/VAS Care Coordination     Patients Home Cardiology Office --  [facility]   Outpatient Care Team Social Worker   Social Worker Name: Lasandra Beech, [place] [phone]   Living arrangements for the past 2 months Apartment   Lives with: Self; Spouse   Patient Current Insurance Coverage Traditional Medicare   Patient Has Concern With Paying Medical Bills No   Does Patient Have Prescription Coverage? Yes   Home Assistive Devices/Equipment None   HH Agency [facility]   Current home services Other (comment)  none       Social History:                                                                             SDOH Screenings   Alcohol Screen: Not on file  Depression (PHQ2-9): Not on file  Financial Resource Strain: Low Risk    Difficulty of Paying Living Expenses: Not very hard  Food Insecurity: No Food Insecurity   Worried About Running Out of Food in the Last Year: Never true   Ran Out of Food in the Last Year: Never true  Housing: Low Risk    Last Housing Risk Score: 0  Physical Activity: Not  on file  Social Connections: Not on file  Stress: Not on file  Tobacco Use: High Risk   Smoking Tobacco Use: Every Powell   Smokeless Tobacco Use: Former  English as a second language teacher Needs: No Regulatory affairs officer (Medical): No   Lack of Transportation (Non-Medical): No    SDOH Interventions: Financial Resources:  Corporate treasurer Interventions: Intervention Not Indicated   Food Insecurity:  Food Insecurity Interventions: Intervention Not Indicated  Housing Insecurity:  Housing Interventions: Intervention  Not Indicated  Transportation:   Transportation Interventions: Intervention Not Indicated    Other Care Navigation Interventions:     Inpatient/Outpatient Substance Abuse Counseling/Rehab Options N/a  Provided Pharmacy assistance resources  Pharmacy assisted with PAP and [employer] [facility]  Patient expressed Mental Health concerns No.  Patient Referred to: N/a   Follow-up plan: Patient assisted by Pharmacy with some resources to support financially during the donut hole. Patient and wife very appreciative for the support. [facility] screen completed with no identified needs at this time. Lasandra Beech, LCSW, CCSW-MCS [phone]

## 2021-03-21 NOTE — Progress Notes (Signed)
[facility] Transitions of Care Clinic Heart Failure Pharmacist Encounter  PCP: [name], MD PCP-Cardiologist: Nanetta Batty, MD  The patient was approved for a Entresto and [place] grant through the [employer].  BIN: [id number] PCN: [id number] ID: [id number] Group: [id number]   Approved through 03/20/2022  Amount: $1,000 Phone Number: [phone]  Sharen Hones, PharmD, BCPS Heart Failure Transitions of Care Clinic Pharmacist [phone]

## 2021-03-22 DIAGNOSIS — J9601 Acute respiratory failure with hypoxia: Secondary | ICD-10-CM | POA: Diagnosis not present

## 2021-03-22 DIAGNOSIS — E1122 Type 2 diabetes mellitus with diabetic chronic kidney disease: Secondary | ICD-10-CM | POA: Diagnosis not present

## 2021-03-22 DIAGNOSIS — F17210 Nicotine dependence, cigarettes, uncomplicated: Secondary | ICD-10-CM | POA: Diagnosis not present

## 2021-03-22 DIAGNOSIS — J449 Chronic obstructive pulmonary disease, unspecified: Secondary | ICD-10-CM | POA: Diagnosis not present

## 2021-03-22 DIAGNOSIS — E785 Hyperlipidemia, unspecified: Secondary | ICD-10-CM | POA: Diagnosis not present

## 2021-03-22 DIAGNOSIS — Z7984 Long term (current) use of oral hypoglycemic drugs: Secondary | ICD-10-CM | POA: Diagnosis not present

## 2021-03-22 DIAGNOSIS — I5033 Acute on chronic diastolic (congestive) heart failure: Secondary | ICD-10-CM | POA: Diagnosis not present

## 2021-03-22 DIAGNOSIS — I2510 Atherosclerotic heart disease of native coronary artery without angina pectoris: Secondary | ICD-10-CM | POA: Diagnosis not present

## 2021-03-22 DIAGNOSIS — E1151 Type 2 diabetes mellitus with diabetic peripheral angiopathy without gangrene: Secondary | ICD-10-CM | POA: Diagnosis not present

## 2021-03-22 DIAGNOSIS — Z7901 Long term (current) use of anticoagulants: Secondary | ICD-10-CM | POA: Diagnosis not present

## 2021-03-22 DIAGNOSIS — I480 Paroxysmal atrial fibrillation: Secondary | ICD-10-CM | POA: Diagnosis not present

## 2021-03-22 DIAGNOSIS — N183 Chronic kidney disease, stage 3 unspecified: Secondary | ICD-10-CM | POA: Diagnosis not present

## 2021-03-22 DIAGNOSIS — Z79899 Other long term (current) drug therapy: Secondary | ICD-10-CM | POA: Diagnosis not present

## 2021-03-22 DIAGNOSIS — I130 Hypertensive heart and chronic kidney disease with heart failure and stage 1 through stage 4 chronic kidney disease, or unspecified chronic kidney disease: Secondary | ICD-10-CM | POA: Diagnosis not present

## 2021-03-22 DIAGNOSIS — Z7902 Long term (current) use of antithrombotics/antiplatelets: Secondary | ICD-10-CM | POA: Diagnosis not present

## 2021-03-22 DIAGNOSIS — K219 Gastro-esophageal reflux disease without esophagitis: Secondary | ICD-10-CM | POA: Diagnosis not present

## 2021-03-22 DIAGNOSIS — Z955 Presence of coronary angioplasty implant and graft: Secondary | ICD-10-CM | POA: Diagnosis not present

## 2021-03-28 ENCOUNTER — Ambulatory Visit (HOSPITAL_COMMUNITY)
Admission: RE | Admit: 2021-03-28 | Discharge: 2021-03-28 | Disposition: A | Discharge status: Home / Self Care | Payer: Medicare Other | Source: Ambulatory Visit | Attending: Internal Medicine | Admitting: Internal Medicine

## 2021-03-28 ENCOUNTER — Other Ambulatory Visit: Payer: Self-pay

## 2021-03-28 DIAGNOSIS — I5032 Chronic diastolic (congestive) heart failure: Secondary | ICD-10-CM | POA: Insufficient documentation

## 2021-03-28 LAB — BASIC METABOLIC PANEL
Anion gap: 10 (ref 5–15)
BUN: 15 mg/dL (ref 8–23)
CO2: 23 mmol/L (ref 22–32)
Calcium: 9.3 mg/dL (ref 8.9–10.3)
Chloride: 102 mmol/L (ref 98–111)
Creatinine, Ser: 1.42 mg/dL — ABNORMAL HIGH (ref 0.61–1.24)
GFR, Estimated: 50 mL/min — ABNORMAL LOW (ref >60)
Glucose, Bld: 224 mg/dL — ABNORMAL HIGH (ref 70–99)
Potassium: 4 mmol/L (ref 3.5–5.1)
Sodium: 135 mmol/L (ref 135–145)

## 2021-04-11 ENCOUNTER — Other Ambulatory Visit (HOSPITAL_COMMUNITY): Payer: Self-pay

## 2021-04-13 ENCOUNTER — Other Ambulatory Visit (HOSPITAL_COMMUNITY): Payer: Self-pay | Admitting: Internal Medicine

## 2021-04-15 DIAGNOSIS — Z23 Encounter for immunization: Secondary | ICD-10-CM | POA: Diagnosis not present

## 2021-04-15 DIAGNOSIS — F3341 Major depressive disorder, recurrent, in partial remission: Secondary | ICD-10-CM | POA: Diagnosis not present

## 2021-04-15 DIAGNOSIS — J441 Chronic obstructive pulmonary disease with (acute) exacerbation: Secondary | ICD-10-CM | POA: Diagnosis not present

## 2021-04-15 DIAGNOSIS — Z7984 Long term (current) use of oral hypoglycemic drugs: Secondary | ICD-10-CM | POA: Diagnosis not present

## 2021-04-15 DIAGNOSIS — I10 Essential (primary) hypertension: Secondary | ICD-10-CM | POA: Diagnosis not present

## 2021-04-15 DIAGNOSIS — I480 Paroxysmal atrial fibrillation: Secondary | ICD-10-CM | POA: Diagnosis not present

## 2021-04-15 DIAGNOSIS — R0600 Dyspnea, unspecified: Secondary | ICD-10-CM | POA: Diagnosis not present

## 2021-04-15 DIAGNOSIS — E785 Hyperlipidemia, unspecified: Secondary | ICD-10-CM | POA: Diagnosis not present

## 2021-04-15 DIAGNOSIS — I5033 Acute on chronic diastolic (congestive) heart failure: Secondary | ICD-10-CM | POA: Diagnosis not present

## 2021-04-15 DIAGNOSIS — E1159 Type 2 diabetes mellitus with other circulatory complications: Secondary | ICD-10-CM | POA: Diagnosis not present

## 2021-04-15 DIAGNOSIS — I2510 Atherosclerotic heart disease of native coronary artery without angina pectoris: Secondary | ICD-10-CM | POA: Diagnosis not present

## 2021-04-15 DIAGNOSIS — Z0000 Encounter for general adult medical examination without abnormal findings: Secondary | ICD-10-CM | POA: Diagnosis not present

## 2021-04-18 ENCOUNTER — Telehealth: Payer: Self-pay | Admitting: Cardiovascular Disease

## 2021-04-18 NOTE — Telephone Encounter (Signed)
   Pt c/o medication issue:  1. Name of Medication:   dapagliflozin propanediol (FARXIGA) 10 MG TABS tablet    sacubitril-valsartan (ENTRESTO) 49-51 MG   2. How are you currently taking this medication (dosage and times per day)?   3. Are you having a reaction (difficulty breathing--STAT)?   4. What is your medication issue? Samantha with [facility] calling she wanted to know if pt is enrolled to pt's assistance

## 2021-04-18 NOTE — Telephone Encounter (Signed)
LM2CB-Albert Powell LM2CB-to discuss medications/pt assistance. I do not see that pt has called to start pt assistance

## 2021-04-19 ENCOUNTER — Telehealth: Payer: Self-pay | Admitting: Cardiovascular Disease

## 2021-04-19 ENCOUNTER — Telehealth (HOSPITAL_COMMUNITY): Payer: Self-pay | Admitting: Pharmacy Technician

## 2021-04-19 NOTE — Telephone Encounter (Signed)
Spoke with the patient who gave permission for [place] to tell Lelon Mast at [facility] that the patient has a grant through the [employer] for [employer].  Lelon Mast has been made aware.

## 2021-04-19 NOTE — Telephone Encounter (Signed)
Albert Powell states that he has a Engineer, manufacturing that pays for his entresto and farxiga  .. please advise

## 2021-04-19 NOTE — Telephone Encounter (Signed)
I received a call from [facility] with [facility]. She is helping the patient switch pharmacies to a pill pack pharmacy and needed help with obtaining PAN grant information.  Referenced Megan's grant note and provided billing information.

## 2021-04-19 NOTE — Telephone Encounter (Signed)
Duplicate- see other message

## 2021-04-21 DIAGNOSIS — I130 Hypertensive heart and chronic kidney disease with heart failure and stage 1 through stage 4 chronic kidney disease, or unspecified chronic kidney disease: Secondary | ICD-10-CM | POA: Diagnosis not present

## 2021-04-21 DIAGNOSIS — Z955 Presence of coronary angioplasty implant and graft: Secondary | ICD-10-CM | POA: Diagnosis not present

## 2021-04-21 DIAGNOSIS — E785 Hyperlipidemia, unspecified: Secondary | ICD-10-CM | POA: Diagnosis not present

## 2021-04-21 DIAGNOSIS — J449 Chronic obstructive pulmonary disease, unspecified: Secondary | ICD-10-CM | POA: Diagnosis not present

## 2021-04-21 DIAGNOSIS — I5033 Acute on chronic diastolic (congestive) heart failure: Secondary | ICD-10-CM | POA: Diagnosis not present

## 2021-04-21 DIAGNOSIS — J9601 Acute respiratory failure with hypoxia: Secondary | ICD-10-CM | POA: Diagnosis not present

## 2021-04-21 DIAGNOSIS — Z7901 Long term (current) use of anticoagulants: Secondary | ICD-10-CM | POA: Diagnosis not present

## 2021-04-21 DIAGNOSIS — I480 Paroxysmal atrial fibrillation: Secondary | ICD-10-CM | POA: Diagnosis not present

## 2021-04-21 DIAGNOSIS — N183 Chronic kidney disease, stage 3 unspecified: Secondary | ICD-10-CM | POA: Diagnosis not present

## 2021-04-21 DIAGNOSIS — E1122 Type 2 diabetes mellitus with diabetic chronic kidney disease: Secondary | ICD-10-CM | POA: Diagnosis not present

## 2021-04-21 DIAGNOSIS — Z79899 Other long term (current) drug therapy: Secondary | ICD-10-CM | POA: Diagnosis not present

## 2021-04-21 DIAGNOSIS — F17210 Nicotine dependence, cigarettes, uncomplicated: Secondary | ICD-10-CM | POA: Diagnosis not present

## 2021-04-21 DIAGNOSIS — K219 Gastro-esophageal reflux disease without esophagitis: Secondary | ICD-10-CM | POA: Diagnosis not present

## 2021-04-21 DIAGNOSIS — E1151 Type 2 diabetes mellitus with diabetic peripheral angiopathy without gangrene: Secondary | ICD-10-CM | POA: Diagnosis not present

## 2021-04-21 DIAGNOSIS — I2510 Atherosclerotic heart disease of native coronary artery without angina pectoris: Secondary | ICD-10-CM | POA: Diagnosis not present

## 2021-04-21 DIAGNOSIS — Z7902 Long term (current) use of antithrombotics/antiplatelets: Secondary | ICD-10-CM | POA: Diagnosis not present

## 2021-04-21 DIAGNOSIS — Z7984 Long term (current) use of oral hypoglycemic drugs: Secondary | ICD-10-CM | POA: Diagnosis not present

## 2021-04-22 ENCOUNTER — Other Ambulatory Visit (HOSPITAL_COMMUNITY): Payer: Self-pay | Admitting: *Deleted

## 2021-04-25 ENCOUNTER — Other Ambulatory Visit (HOSPITAL_COMMUNITY): Payer: Self-pay

## 2021-04-25 MED ORDER — DAPAGLIFLOZIN PROPANEDIOL 10 MG PO TABS: 10.0000 mg | ORAL_TABLET | Freq: Every day | ORAL | 2 refills | Status: DC

## 2021-04-25 MED ORDER — AMLODIPINE BESYLATE 5 MG PO TABS: 5.0000 mg | ORAL_TABLET | Freq: Every day | ORAL | 2 refills | Status: DC

## 2021-04-25 MED ORDER — SPIRONOLACTONE 25 MG PO TABS: 12.5000 mg | ORAL_TABLET | Freq: Every day | ORAL | 0 refills | Status: DC

## 2021-04-29 ENCOUNTER — Encounter (HOSPITAL_BASED_OUTPATIENT_CLINIC_OR_DEPARTMENT_OTHER): Payer: Self-pay | Admitting: Family

## 2021-04-29 ENCOUNTER — Ambulatory Visit (INDEPENDENT_AMBULATORY_CARE_PROVIDER_SITE_OTHER): Payer: Medicare Other | Admitting: Family

## 2021-04-29 ENCOUNTER — Other Ambulatory Visit: Payer: Self-pay

## 2021-04-29 VITALS — BP 110/66 | HR 67 | Ht 69.0 in | Wt 218.8 lb

## 2021-04-29 DIAGNOSIS — I739 Peripheral vascular disease, unspecified: Secondary | ICD-10-CM

## 2021-04-29 DIAGNOSIS — I25118 Atherosclerotic heart disease of native coronary artery with other forms of angina pectoris: Secondary | ICD-10-CM | POA: Diagnosis not present

## 2021-04-29 DIAGNOSIS — E785 Hyperlipidemia, unspecified: Secondary | ICD-10-CM

## 2021-04-29 DIAGNOSIS — I480 Paroxysmal atrial fibrillation: Secondary | ICD-10-CM | POA: Diagnosis not present

## 2021-04-29 DIAGNOSIS — I5032 Chronic diastolic (congestive) heart failure: Secondary | ICD-10-CM | POA: Diagnosis not present

## 2021-04-29 DIAGNOSIS — Z7901 Long term (current) use of anticoagulants: Secondary | ICD-10-CM

## 2021-04-29 NOTE — Patient Instructions (Signed)
Medication Instructions:  Your physician has recommended you make the following change in your medication:   STOP Amlodipine  *If you need a refill on your cardiac medications before your next appointment, please call your pharmacy*   Lab Work: None ordered today.   Testing/Procedures: None ordered today.   Follow-Up: At [facility], you and your health needs are our priority.  As part of our continuing mission to provide you with exceptional heart care, we have created designated Provider Care Teams.  These Care Teams include your primary Cardiologist (physician) and Advanced Practice Providers (APPs -  Physician Assistants and Nurse Practitioners) who all work together to provide you with the care you need, when you need it.  We recommend signing up for the patient portal called "MyChart".  Sign up information is provided on this After Visit Summary.  MyChart is used to connect with patients for Virtual Visits (Telemedicine).  Patients are able to view lab/test results, encounter notes, upcoming appointments, etc.  Non-urgent messages can be sent to your provider as well.   To learn more about what you can do with MyChart, go to [email or web address].    Your next appointment:   2-3 month(s)  The format for your next appointment:   In Person  Provider:   You may see Nanetta Batty, MD or one of the following Advanced Practice Providers on your designated Care Team:   Dwight Mission, PA-C [name], FNP   Other Instructions  Heart Healthy Diet Recommendations: A low-salt diet is recommended. Meats should be grilled, baked, or boiled. Avoid fried foods. Focus on lean protein sources like fish or chicken with vegetables and fruits. The American Heart Association is a Chief Technology Officer!  American Heart Association Diet and Lifeystyle Recommendations   Exercise recommendations: The American Heart Association recommends 150 minutes of moderate intensity exercise weekly. Try  30 minutes of moderate intensity exercise 4-5 times per week. This could include walking, jogging, or swimming.

## 2021-04-29 NOTE — Progress Notes (Signed)
Office Visit    Patient Name: Albert Powell Date of Encounter: 04/29/2021  PCP:  [name], MD   [facility]  Cardiologist:  Nanetta Batty, MD  Advanced Practice Provider:  No care team member to display Electrophysiologist:  None   Chief Complaint    Albert Powell is a 80 y.o. male with a hx of PAF, CAD s/p stenting X2 to RCA in 2003 with residual LAD disease, carotid artery disease s/p LICA endarterectomy 04/2013, PAD, hypertension, hyperlipidemia, DM2, renal artery stenosis presents today for diastolic heart failure follow up.   Past Medical History    Past Medical History:  Diagnosis Date   Atrial fibrillation ([name]) 03/30/2020   Bradycardia, sinus 02/18/2013   CAD (coronary artery disease)    stent to RCA 2003 also had 40% lesion at that time; NUCLEAR STRESS TEST, 01/16/2010 - normal  now with cath 80-90% stenosis in LAD, will try medical therapy if no improvement PCI   Carotid artery disease ([name])    Cataract    Claudication ([name])    LEA DUPLEX, 07/07/2008 - Normal   COPD (chronic obstructive pulmonary disease) ([name])    DDD (degenerative disc disease) 2008   Diabetes mellitus    GERD (gastroesophageal reflux disease)    H/O hiatal hernia    History of colonoscopy 2004   finding of tics and AMV only no polpys   Hyperlipidemia 02/05/2013   Hypertension    Onychomycosis    Rosacea    Tobacco abuse 02/05/2013   Past Surgical History:  Procedure Laterality Date   ABDOMINAL AORTOGRAM W/LOWER EXTREMITY N/A 03/30/2018   Procedure: ABDOMINAL AORTOGRAM W/LOWER EXTREMITY;  Surgeon: Nada Libman, MD;  Location: [facility];  Service: Cardiovascular;  Laterality: N/A;   ABDOMINAL AORTOGRAM W/LOWER EXTREMITY Bilateral 08/27/2020   Procedure: ABDOMINAL AORTOGRAM W/LOWER EXTREMITY;  Surgeon: [name], MD;  Location: [facility];  Service: Cardiovascular;  Laterality: Bilateral;   APPENDECTOMY     CARDIAC  CATHETERIZATION  08/29/2002   2-vessel CAD with high-grade stenoses in RCA; stenting to RCA   CARDIAC CATHETERIZATION  2014   80-90% lesion will try medical therapy   CARDIAC SURGERY     stents  2003   CARDIOVERSION N/A 11/01/2013   Procedure: Dimple Nanas COMPRESSION;  Surgeon: [name], MD;  Location: [facility] CATH LAB;  Service: Cardiovascular;  Laterality: N/A;   CAROTID ANGIOGRAM N/A 04/25/2013   Procedure: CAROTID ANGIOGRAM;  Surgeon: [name], MD;  Location: [facility] CATH LAB;  Service: Cardiovascular;  Laterality: N/A;   CAROTID ENDARTERECTOMY     COLON RESECTION  3/04, 6/04    1 bleeding diverticulitis, 2 complete colectomy   COLON SURGERY  2005   renal pouch rectal anastimosis   CORONARY ANGIOPLASTY WITH STENT PLACEMENT  09/2002   2 stents to RCA   ENDARTERECTOMY Left 06/02/2013   Procedure: ENDARTERECTOMY CAROTID-LEFT;  Surgeon: Nada Libman, MD;  Location: [facility] OR;  Service: Vascular;  Laterality: Left;   INTRAVASCULAR PRESSURE WIRE/FFR STUDY N/A 04/02/2020   Procedure: INTRAVASCULAR PRESSURE WIRE/FFR STUDY;  Surgeon: [name], MD;  Location: [facility];  Service: Cardiovascular;  Laterality: N/A;  DFR - RCA   LEFT HEART CATH AND CORONARY ANGIOGRAPHY N/A 04/02/2020   Procedure: LEFT HEART CATH AND CORONARY ANGIOGRAPHY;  Surgeon: [name], MD;  Location: [facility];  Service: Cardiovascular;  Laterality: N/A;   LEFT HEART CATHETERIZATION WITH CORONARY ANGIOGRAM  N/A 02/08/2013   Procedure: LEFT HEART CATHETERIZATION WITH CORONARY ANGIOGRAM;  Surgeon: [name], MD;  Location: [facility] CATH LAB;  Service: Cardiovascular;  Laterality: N/A;   LOWER EXTREMITY ANGIOGRAM N/A 10/20/2013   Procedure: LOWER EXTREMITY ANGIOGRAM;  Surgeon: [name], MD;  Location: [facility] CATH LAB;  Service: Cardiovascular;  Laterality: N/A;   PATCH ANGIOPLASTY Left 06/02/2013   Procedure: PATCH ANGIOPLASTY;  Surgeon: Nada Libman, MD;  Location: [facility] OR;  Service:  Vascular;  Laterality: Left;   PERIPHERAL VASCULAR INTERVENTION Bilateral 03/30/2018   Procedure: PERIPHERAL VASCULAR INTERVENTION;  Surgeon: Nada Libman, MD;  Location: [facility];  Service: Cardiovascular;  Laterality: Bilateral;  common iliacs   TONSILLECTOMY      Allergies  Allergies  Allergen Reactions   Fentanyl Other (See Comments)    Behavioral changes   Gabapentin Other (See Comments)    ankle swells   Lisinopril Other (See Comments)    unknown   Lyrica [Pregabalin] Other (See Comments)    unknown   Metformin Diarrhea   Propofol Other (See Comments)    Heart rate dropped    History of Present Illness    Albert Powell is a 80 y.o. male with a hx of PAF, CAD s/p stenting X2 to RCA in 2003 with residual LAD disease, carotid artery disease s/p LICA endarterectomy 04/2013, PAD, hypertension, hyperlipidemia, DM2, renal artery stenosis last seen follow up of diastolic heart failure.  He has had multiple vascular interventions including LICA endarterectomy 2014, right external iliac artery atherectomy and stenting 2015, repeat angiography 2019 with bilateral iliac bifurcation VBX stents placed.   July 2021 he developed atrial fibrillation with RVR and chest pain and went for repeat LHC which demonstrated patent proximal RCA stent with intermediate lesion and 70% range with negative DFR just beyond his mid RCA.  Otherwise noncritical CAD.  Most recent PV angiogram December 2021 with patent VBX stents.  Report also mentions high-grade exophytic calcific disease in distal right external neck artery and right common femoral artery with recommendation for surgical endarterectomy and patch angioplasty.  He also has high-grade exophytic calcific plaque in his mid left SFA which can be treated with orbital atherectomy plus or minus drug-coated balloon angioplasty plus or minus stenting.  He was admitted April 2022 for hypertensive emergency, flash pulmonary edema, atrial for  with RVR due to poor medication adherence.  All medications were resumed.  He was admitted 626/20 with acute on chronic diastolic heart failure.  BNP elevated at 486.  BiPAP required.  Noted bilateral interstitial edema and a pattern consistent with CHF on x-ray. EKG NSR. Echo with LVEF 60-65%, grade 1 diastolic dysfunction, smll pericardial effusion. He was started on IV Lasix. Weight 236 lbs ? 233 lbs. He w was discharged on Lasix 20 mg, Entresto 49/51, Toprol tartrate 25 mg twice daily, amlodipine 10 mg, Imdur 60 mg.  He was seen in transitions of care follow-up 03/21/2021 noting home blood pressures in the 130s.  Still dyspneic with exertion with long walks.  His Lasix was transitioned to as needed.  His amlodipine was decreased to 5 mg.  Spironolactone 12.5 mg is initiated.  Potassium supplement was stopped.  Farxiga 10 mg daily was initiated.  Presents today for follow-up with his wife. His blood pressure at home is in the 110s-120s. Reports no lightheadedness nor dizziness. . Reports no chest pain, pressure, or tightness. No orthopnea, PND. Reports no palpitations.  Does note fatigue and lack of stamina. He  recently completed physical therapy at home and plans to continue the exercises. Still has HH nurse. He reports sweelling in his right leg which is stable at his baseline and he attributes to PAD. He reports some pain in his right leg which is more of a numbness with walking distances such as to the mailbox. This is overall unchanged since PV study with Dr. Allyson Sabal 08/2020.   EKGs/Labs/Other Studies Reviewed:   The following studies were reviewed today:  Echo 03/10/21  1. Left ventricular ejection fraction, by estimation, is 60 to 65%. The  left ventricle has normal function. The left ventricle has no regional  wall motion abnormalities. There is mild concentric left ventricular  hypertrophy. Left ventricular diastolic  parameters are consistent with Grade I diastolic dysfunction (impaired   relaxation).   2. Right ventricular systolic function is normal. The right ventricular  size is normal. Tricuspid regurgitation signal is inadequate for assessing  PA pressure.   3. A small pericardial effusion is present. The pericardial effusion is  circumferential. There is no evidence of cardiac tamponade.   4. The mitral valve is grossly normal. No evidence of mitral valve  regurgitation. No evidence of mitral stenosis.   5. The aortic valve is tricuspid. There is mild calcification of the  aortic valve. Aortic valve regurgitation is not visualized. Mild aortic  valve sclerosis is present, with no evidence of aortic valve stenosis.   6. The inferior vena cava is normal in size with greater than 50%  respiratory variability, suggesting right atrial pressure of 3 mmHg.   EKG: No EKG today  Recent Labs: 03/10/2021: ALT 19; B Natriuretic Peptide 486.9 03/12/2021: Hemoglobin 12.6; Magnesium 1.9; Platelets 250 03/28/2021: BUN 15; Creatinine, Ser 1.42; Potassium 4.0; Sodium 135  Recent Lipid Panel    Component Value Date/Time   CHOL 122 03/30/2020 1837   TRIG 85 03/30/2020 1837   HDL 53 03/30/2020 1837   CHOLHDL 2.3 03/30/2020 1837   VLDL 17 03/30/2020 1837   LDLCALC 52 03/30/2020 1837    Risk Assessment/Calculations:   CHA2DS2-VASc Score = 6  This indicates a 9.7% annual risk of stroke. The patient's score is based upon: CHF History: Yes HTN History: Yes Diabetes History: Yes Stroke History: No Vascular Disease History: Yes Age Score: 2 Gender Score: 0   Home Medications   Current Meds  Medication Sig   amLODipine (NORVASC) 5 MG tablet Take 1 tablet (5 mg total) by mouth daily.   apixaban (ELIQUIS) 5 MG TABS tablet Take 1 tablet (5 mg total) by mouth 2 (two) times daily.   atorvastatin (LIPITOR) 40 MG tablet Take 40 mg by mouth at bedtime.    clopidogrel (PLAVIX) 75 MG tablet Take 1 tablet (75 mg total) by mouth daily.   dapagliflozin propanediol (FARXIGA) 10 MG TABS  tablet Take 1 tablet (10 mg total) by mouth daily before breakfast.   escitalopram (LEXAPRO) 20 MG tablet Take 20 mg by mouth daily.   furosemide (LASIX) 20 MG tablet TAKE 1 TABLET BY MOUTH EVERY DAY AS NEEDED   isosorbide mononitrate (IMDUR) 60 MG 24 hr tablet Take 1 tablet (60 mg total) by mouth daily.   metoprolol tartrate (LOPRESSOR) 25 MG tablet Take 1 tablet (25 mg total) by mouth 2 (two) times daily.   OZEMPIC, 1 MG/DOSE, 2 MG/1.5ML SOPN Inject 2.5 mg into the skin once a week.    pantoprazole (PROTONIX) 40 MG tablet Take 40 mg by mouth daily.    Polyethyl Glycol-Propyl Glycol (SYSTANE FREE  OP) Place 1 drop into both eyes daily as needed (dry eyes).   sacubitril-valsartan (ENTRESTO) 49-51 MG Take 1 tablet by mouth 2 (two) times daily.   spironolactone (ALDACTONE) 25 MG tablet Take 0.5 tablets (12.5 mg total) by mouth daily.   Current Facility-Administered Medications for the 04/29/21 encounter (Office Visit) with Alver Sorrow, NP  Medication   sodium chloride flush (NS) 0.9 % injection 3 mL     Review of Systems      All other systems reviewed and are otherwise negative except as noted above.  Physical Exam    VS:  BP 110/66 (BP Location: Left Arm, Patient Position: Sitting, Cuff Size: Normal)   Pulse 67   Ht 5\' 9"  (1.753 m)   Wt 218 lb 12.8 oz (99.2 kg)   SpO2 95%   BMI 32.31 kg/m  , BMI Body mass index is 32.31 kg/m.  Wt Readings from Last 3 Encounters:  04/29/21 218 lb 12.8 oz (99.2 kg)  03/21/21 229 lb 12.8 oz (104.2 kg)  03/12/21 233 lb 4 oz (105.8 kg)     GEN: Well nourished, well developed, in no acute distress. HEENT: normal. Neck: Supple, no JVD, carotid bruits, or masses. Cardiac: RRR, no murmurs, rubs, or gallops. No clubbing, cyanosis, edema.  Radials/PT 2+ and equal bilaterally.  Respiratory:  Respirations regular and unlabored, clear to auscultation bilaterally. GI: Soft, nontender, nondistended. MS: No deformity or atrophy. Skin: Warm and dry, no  rash. Neuro:  Strength and sensation are intact. Psych: Normal affect.  Assessment & Plan    Chronic diastolic heart failure -02/2019 echo LVEF 60 to 75%, grade 1 diastolic dysfunction, small pericardial effusion. Euvolemic and well compensated on exam. Continue Lasix 20mg  PRN. Reports hypotension with fatigue, will discontinue Amlodipine. He will continue GDMT including Entresto, Farxiga, Spironolactone, Metoprolol. Low salt diet, fluid restriction encouraged.   CAD - Stable with no anginal symptoms. No indication for ischemic evaluation.  GDMT includes Metoprolol, Imdur, Atorvastatin, Plavix. No aspirin due to anticoagulation.  DM2 - Continue to follow with PCP.   PAF - Maintaining NSR by auscultation. Denies palpitations. Continue Metoprolol 25mg  BID. CHA2DS2-VASc Score = 6 [CHF History: Yes, HTN History: Yes, Diabetes History: Yes, Stroke History: No, Vascular Disease History: Yes, Age Score: 2, Gender Score: 0].  Therefore, the patient's annual risk of stroke is 9.7 %.  Continue Eliquis 5mg  BID, denies bleeding complications, does not meet dose reduction criteria.  CKD 3A - Careful titration of diuretic and antihypertensive.    PAD - Continue to follow with Dr. . Reports RLE numbness with ambulation that is overall unchanged over the last six months. Per PV study 08/2020 will require surgical intervention. Will route to Dr. for recommendation on follow up with him vs visit with Dr. whom he has seen previously.  Disposition: Follow up in 2-3 month(s) with Dr. Allyson Sabal or APP.  Signed, 09/2020, NP 04/29/2021, 3:31 PM [facility]

## 2021-05-09 ENCOUNTER — Other Ambulatory Visit (HOSPITAL_COMMUNITY): Payer: Self-pay | Admitting: Cardiology

## 2021-05-09 DIAGNOSIS — F3341 Major depressive disorder, recurrent, in partial remission: Secondary | ICD-10-CM | POA: Diagnosis not present

## 2021-05-09 DIAGNOSIS — I10 Essential (primary) hypertension: Secondary | ICD-10-CM | POA: Diagnosis not present

## 2021-05-09 DIAGNOSIS — G4700 Insomnia, unspecified: Secondary | ICD-10-CM | POA: Diagnosis not present

## 2021-05-09 DIAGNOSIS — E1121 Type 2 diabetes mellitus with diabetic nephropathy: Secondary | ICD-10-CM | POA: Diagnosis not present

## 2021-05-09 DIAGNOSIS — E1159 Type 2 diabetes mellitus with other circulatory complications: Secondary | ICD-10-CM | POA: Diagnosis not present

## 2021-05-09 DIAGNOSIS — G8929 Other chronic pain: Secondary | ICD-10-CM | POA: Diagnosis not present

## 2021-05-09 DIAGNOSIS — K219 Gastro-esophageal reflux disease without esophagitis: Secondary | ICD-10-CM | POA: Diagnosis not present

## 2021-05-09 DIAGNOSIS — I5032 Chronic diastolic (congestive) heart failure: Secondary | ICD-10-CM | POA: Diagnosis not present

## 2021-05-09 DIAGNOSIS — E785 Hyperlipidemia, unspecified: Secondary | ICD-10-CM | POA: Diagnosis not present

## 2021-05-09 DIAGNOSIS — I2510 Atherosclerotic heart disease of native coronary artery without angina pectoris: Secondary | ICD-10-CM | POA: Diagnosis not present

## 2021-05-09 DIAGNOSIS — J449 Chronic obstructive pulmonary disease, unspecified: Secondary | ICD-10-CM | POA: Diagnosis not present

## 2021-05-09 DIAGNOSIS — I480 Paroxysmal atrial fibrillation: Secondary | ICD-10-CM | POA: Diagnosis not present

## 2021-05-09 MED ORDER — SACUBITRIL-VALSARTAN 49-51 MG PO TABS: 1.0000 | ORAL_TABLET | Freq: Two times a day (BID) | ORAL | 3 refills | Status: DC

## 2021-05-13 ENCOUNTER — Other Ambulatory Visit (HOSPITAL_COMMUNITY): Payer: Self-pay

## 2021-05-21 DIAGNOSIS — E1159 Type 2 diabetes mellitus with other circulatory complications: Secondary | ICD-10-CM | POA: Diagnosis not present

## 2021-05-21 DIAGNOSIS — I739 Peripheral vascular disease, unspecified: Secondary | ICD-10-CM | POA: Diagnosis not present

## 2021-05-21 DIAGNOSIS — R29898 Other symptoms and signs involving the musculoskeletal system: Secondary | ICD-10-CM | POA: Diagnosis not present

## 2021-05-21 DIAGNOSIS — I2510 Atherosclerotic heart disease of native coronary artery without angina pectoris: Secondary | ICD-10-CM | POA: Diagnosis not present

## 2021-05-21 DIAGNOSIS — I10 Essential (primary) hypertension: Secondary | ICD-10-CM | POA: Diagnosis not present

## 2021-05-21 DIAGNOSIS — Z7984 Long term (current) use of oral hypoglycemic drugs: Secondary | ICD-10-CM | POA: Diagnosis not present

## 2021-05-21 DIAGNOSIS — I480 Paroxysmal atrial fibrillation: Secondary | ICD-10-CM | POA: Diagnosis not present

## 2021-05-21 DIAGNOSIS — R2681 Unsteadiness on feet: Secondary | ICD-10-CM | POA: Diagnosis not present

## 2021-05-21 DIAGNOSIS — I5032 Chronic diastolic (congestive) heart failure: Secondary | ICD-10-CM | POA: Diagnosis not present

## 2021-06-06 ENCOUNTER — Inpatient Hospital Stay (HOSPITAL_COMMUNITY)
Admission: EM | Admit: 2021-06-06 | Discharge: 2021-06-10 | DRG: 291 | Disposition: A | Discharge status: Home Health | Payer: Medicare Other | Attending: Internal Medicine | Admitting: Internal Medicine

## 2021-06-06 ENCOUNTER — Encounter: Payer: Self-pay | Admitting: Emergency Medicine

## 2021-06-06 ENCOUNTER — Encounter (HOSPITAL_COMMUNITY): Payer: Self-pay | Admitting: Emergency Medicine

## 2021-06-06 ENCOUNTER — Other Ambulatory Visit: Payer: Self-pay

## 2021-06-06 ENCOUNTER — Emergency Department (HOSPITAL_COMMUNITY): Payer: Medicare Other

## 2021-06-06 DIAGNOSIS — I161 Hypertensive emergency: Secondary | ICD-10-CM | POA: Diagnosis present

## 2021-06-06 DIAGNOSIS — Z87891 Personal history of nicotine dependence: Secondary | ICD-10-CM

## 2021-06-06 DIAGNOSIS — I509 Heart failure, unspecified: Secondary | ICD-10-CM | POA: Diagnosis present

## 2021-06-06 DIAGNOSIS — J449 Chronic obstructive pulmonary disease, unspecified: Secondary | ICD-10-CM | POA: Diagnosis present

## 2021-06-06 DIAGNOSIS — E785 Hyperlipidemia, unspecified: Secondary | ICD-10-CM | POA: Diagnosis present

## 2021-06-06 DIAGNOSIS — E119 Type 2 diabetes mellitus without complications: Secondary | ICD-10-CM

## 2021-06-06 DIAGNOSIS — I739 Peripheral vascular disease, unspecified: Secondary | ICD-10-CM | POA: Diagnosis present

## 2021-06-06 DIAGNOSIS — K219 Gastro-esophageal reflux disease without esophagitis: Secondary | ICD-10-CM | POA: Diagnosis present

## 2021-06-06 DIAGNOSIS — I480 Paroxysmal atrial fibrillation: Secondary | ICD-10-CM | POA: Diagnosis present

## 2021-06-06 DIAGNOSIS — Z83438 Family history of other disorder of lipoprotein metabolism and other lipidemia: Secondary | ICD-10-CM

## 2021-06-06 DIAGNOSIS — G9389 Other specified disorders of brain: Secondary | ICD-10-CM | POA: Diagnosis present

## 2021-06-06 DIAGNOSIS — Z6831 Body mass index (BMI) 31.0-31.9, adult: Secondary | ICD-10-CM

## 2021-06-06 DIAGNOSIS — N1832 Chronic kidney disease, stage 3b: Secondary | ICD-10-CM

## 2021-06-06 DIAGNOSIS — Z825 Family history of asthma and other chronic lower respiratory diseases: Secondary | ICD-10-CM

## 2021-06-06 DIAGNOSIS — R410 Disorientation, unspecified: Secondary | ICD-10-CM | POA: Diagnosis not present

## 2021-06-06 DIAGNOSIS — Z20822 Contact with and (suspected) exposure to covid-19: Secondary | ICD-10-CM | POA: Diagnosis present

## 2021-06-06 DIAGNOSIS — Z794 Long term (current) use of insulin: Secondary | ICD-10-CM | POA: Diagnosis not present

## 2021-06-06 DIAGNOSIS — Z7901 Long term (current) use of anticoagulants: Secondary | ICD-10-CM

## 2021-06-06 DIAGNOSIS — Z23 Encounter for immunization: Secondary | ICD-10-CM | POA: Diagnosis present

## 2021-06-06 DIAGNOSIS — I130 Hypertensive heart and chronic kidney disease with heart failure and stage 1 through stage 4 chronic kidney disease, or unspecified chronic kidney disease: Secondary | ICD-10-CM | POA: Diagnosis present

## 2021-06-06 DIAGNOSIS — E1151 Type 2 diabetes mellitus with diabetic peripheral angiopathy without gangrene: Secondary | ICD-10-CM | POA: Diagnosis present

## 2021-06-06 DIAGNOSIS — I5032 Chronic diastolic (congestive) heart failure: Secondary | ICD-10-CM | POA: Diagnosis present

## 2021-06-06 DIAGNOSIS — I110 Hypertensive heart disease with heart failure: Secondary | ICD-10-CM | POA: Diagnosis not present

## 2021-06-06 DIAGNOSIS — I5031 Acute diastolic (congestive) heart failure: Secondary | ICD-10-CM | POA: Diagnosis present

## 2021-06-06 DIAGNOSIS — Z8249 Family history of ischemic heart disease and other diseases of the circulatory system: Secondary | ICD-10-CM

## 2021-06-06 DIAGNOSIS — R0602 Shortness of breath: Secondary | ICD-10-CM | POA: Diagnosis not present

## 2021-06-06 DIAGNOSIS — I517 Cardiomegaly: Secondary | ICD-10-CM | POA: Diagnosis not present

## 2021-06-06 DIAGNOSIS — N1831 Chronic kidney disease, stage 3a: Secondary | ICD-10-CM | POA: Diagnosis present

## 2021-06-06 DIAGNOSIS — F32A Depression, unspecified: Secondary | ICD-10-CM | POA: Diagnosis present

## 2021-06-06 DIAGNOSIS — Z955 Presence of coronary angioplasty implant and graft: Secondary | ICD-10-CM

## 2021-06-06 DIAGNOSIS — I4891 Unspecified atrial fibrillation: Secondary | ICD-10-CM | POA: Diagnosis not present

## 2021-06-06 DIAGNOSIS — G9341 Metabolic encephalopathy: Secondary | ICD-10-CM | POA: Diagnosis present

## 2021-06-06 DIAGNOSIS — Z7902 Long term (current) use of antithrombotics/antiplatelets: Secondary | ICD-10-CM

## 2021-06-06 DIAGNOSIS — R000 Tachycardia, unspecified: Secondary | ICD-10-CM | POA: Diagnosis not present

## 2021-06-06 DIAGNOSIS — N179 Acute kidney failure, unspecified: Secondary | ICD-10-CM | POA: Diagnosis present

## 2021-06-06 DIAGNOSIS — E669 Obesity, unspecified: Secondary | ICD-10-CM | POA: Diagnosis present

## 2021-06-06 DIAGNOSIS — Z888 Allergy status to other drugs, medicaments and biological substances status: Secondary | ICD-10-CM

## 2021-06-06 DIAGNOSIS — Z79899 Other long term (current) drug therapy: Secondary | ICD-10-CM

## 2021-06-06 DIAGNOSIS — Z823 Family history of stroke: Secondary | ICD-10-CM

## 2021-06-06 DIAGNOSIS — I169 Hypertensive crisis, unspecified: Secondary | ICD-10-CM | POA: Diagnosis present

## 2021-06-06 DIAGNOSIS — E1122 Type 2 diabetes mellitus with diabetic chronic kidney disease: Secondary | ICD-10-CM | POA: Diagnosis present

## 2021-06-06 DIAGNOSIS — I248 Other forms of acute ischemic heart disease: Secondary | ICD-10-CM | POA: Diagnosis present

## 2021-06-06 DIAGNOSIS — T502X5A Adverse effect of carbonic-anhydrase inhibitors, benzothiadiazides and other diuretics, initial encounter: Secondary | ICD-10-CM | POA: Diagnosis present

## 2021-06-06 DIAGNOSIS — R231 Pallor: Secondary | ICD-10-CM | POA: Diagnosis not present

## 2021-06-06 DIAGNOSIS — R531 Weakness: Secondary | ICD-10-CM | POA: Diagnosis not present

## 2021-06-06 DIAGNOSIS — I5033 Acute on chronic diastolic (congestive) heart failure: Secondary | ICD-10-CM | POA: Diagnosis present

## 2021-06-06 DIAGNOSIS — I2510 Atherosclerotic heart disease of native coronary artery without angina pectoris: Secondary | ICD-10-CM | POA: Diagnosis present

## 2021-06-06 DIAGNOSIS — Z833 Family history of diabetes mellitus: Secondary | ICD-10-CM

## 2021-06-06 DIAGNOSIS — I10 Essential (primary) hypertension: Secondary | ICD-10-CM | POA: Diagnosis not present

## 2021-06-06 HISTORY — DX: Heart failure, unspecified: I50.9

## 2021-06-06 LAB — CBC WITH DIFFERENTIAL/PLATELET
Abs Immature Granulocytes: 0.09 10*3/uL — ABNORMAL HIGH (ref 0.00–0.07)
Basophils Absolute: 0 10*3/uL (ref 0.0–0.1)
Basophils Relative: 1 %
Eosinophils Absolute: 0.2 10*3/uL (ref 0.0–0.5)
Eosinophils Relative: 2 %
HCT: 48.4 % (ref 39.0–52.0)
Hemoglobin: 16 g/dL (ref 13.0–17.0)
Immature Granulocytes: 1 %
Lymphocytes Relative: 13 %
Lymphs Abs: 1.1 10*3/uL (ref 0.7–4.0)
MCH: 28.8 pg (ref 26.0–34.0)
MCHC: 33.1 g/dL (ref 30.0–36.0)
MCV: 87.1 fL (ref 80.0–100.0)
Monocytes Absolute: 0.8 10*3/uL (ref 0.1–1.0)
Monocytes Relative: 10 %
Neutro Abs: 6 10*3/uL (ref 1.7–7.7)
Neutrophils Relative %: 73 %
Platelets: 229 10*3/uL (ref 150–400)
RBC: 5.56 MIL/uL (ref 4.22–5.81)
RDW: 14.7 % (ref 11.5–15.5)
WBC: 8.2 10*3/uL (ref 4.0–10.5)
nRBC: 0 % (ref 0.0–0.2)

## 2021-06-06 LAB — COMPREHENSIVE METABOLIC PANEL
ALT: 20 U/L (ref 0–44)
AST: 23 U/L (ref 15–41)
Albumin: 3.7 g/dL (ref 3.5–5.0)
Alkaline Phosphatase: 75 U/L (ref 38–126)
Anion gap: 9 (ref 5–15)
BUN: 20 mg/dL (ref 8–23)
CO2: 28 mmol/L (ref 22–32)
Calcium: 9.2 mg/dL (ref 8.9–10.3)
Chloride: 99 mmol/L (ref 98–111)
Creatinine, Ser: 1.36 mg/dL — ABNORMAL HIGH (ref 0.61–1.24)
GFR, Estimated: 53 mL/min — ABNORMAL LOW (ref >60)
Glucose, Bld: 145 mg/dL — ABNORMAL HIGH (ref 70–99)
Potassium: 3.7 mmol/L (ref 3.5–5.1)
Sodium: 136 mmol/L (ref 135–145)
Total Bilirubin: 0.6 mg/dL (ref 0.3–1.2)
Total Protein: 7 g/dL (ref 6.5–8.1)

## 2021-06-06 LAB — RESP PANEL BY RT-PCR (FLU A&B, COVID) ARPGX2
Influenza A by PCR: NEGATIVE
Influenza B by PCR: NEGATIVE
SARS Coronavirus 2 by RT PCR: NEGATIVE

## 2021-06-06 LAB — TROPONIN I (HIGH SENSITIVITY)
Troponin I (High Sensitivity): 41 ng/L — ABNORMAL HIGH (ref <18)
Troponin I (High Sensitivity): 39 ng/L — ABNORMAL HIGH (ref <18)

## 2021-06-06 LAB — BRAIN NATRIURETIC PEPTIDE: B Natriuretic Peptide: 1349.7 pg/mL — ABNORMAL HIGH (ref 0.0–100.0)

## 2021-06-06 MED ORDER — POTASSIUM CHLORIDE CRYS ER 20 MEQ PO TBCR
40.0000 meq | EXTENDED_RELEASE_TABLET | Freq: Once | ORAL | Status: AC
  Administered 2021-06-06: 40 meq via ORAL
  Filled 2021-06-06: qty 2

## 2021-06-06 MED ORDER — METOPROLOL TARTRATE 25 MG PO TABS
25.0000 mg | ORAL_TABLET | Freq: Two times a day (BID) | ORAL | Status: DC
  Administered 2021-06-06 – 2021-06-10 (×9): 25 mg via ORAL
  Filled 2021-06-06 (×8): qty 1

## 2021-06-06 MED ORDER — ACETAMINOPHEN 650 MG RE SUPP: 650.0000 mg | Freq: Four times a day (QID) | RECTAL | Status: DC | PRN

## 2021-06-06 MED ORDER — ACETAMINOPHEN 325 MG PO TABS
650.0000 mg | ORAL_TABLET | Freq: Four times a day (QID) | ORAL | Status: DC | PRN
  Administered 2021-06-06: 650 mg via ORAL
  Filled 2021-06-06: qty 2

## 2021-06-06 MED ORDER — INSULIN ASPART 100 UNIT/ML IJ SOLN
0.0000 [IU] | Freq: Three times a day (TID) | INTRAMUSCULAR | Status: DC
  Administered 2021-06-07: 3 [IU] via SUBCUTANEOUS
  Administered 2021-06-07 (×2): 5 [IU] via SUBCUTANEOUS
  Administered 2021-06-08 (×2): 2 [IU] via SUBCUTANEOUS
  Administered 2021-06-08: 5 [IU] via SUBCUTANEOUS

## 2021-06-06 MED ORDER — ESCITALOPRAM OXALATE 10 MG PO TABS
20.0000 mg | ORAL_TABLET | Freq: Every day | ORAL | Status: DC
  Administered 2021-06-07 – 2021-06-10 (×4): 20 mg via ORAL
  Filled 2021-06-06 (×4): qty 2

## 2021-06-06 MED ORDER — SACUBITRIL-VALSARTAN 49-51 MG PO TABS
1.0000 | ORAL_TABLET | Freq: Two times a day (BID) | ORAL | Status: DC
  Administered 2021-06-06 – 2021-06-08 (×4): 1 via ORAL
  Filled 2021-06-06 (×5): qty 1

## 2021-06-06 MED ORDER — ISOSORBIDE MONONITRATE ER 60 MG PO TB24
60.0000 mg | ORAL_TABLET | Freq: Every day | ORAL | Status: DC
  Administered 2021-06-07 – 2021-06-08 (×2): 60 mg via ORAL
  Filled 2021-06-06 (×2): qty 1

## 2021-06-06 MED ORDER — AMLODIPINE BESYLATE 5 MG PO TABS
5.0000 mg | ORAL_TABLET | Freq: Every day | ORAL | Status: DC
  Administered 2021-06-06 – 2021-06-10 (×5): 5 mg via ORAL
  Filled 2021-06-06 (×5): qty 1

## 2021-06-06 MED ORDER — ATORVASTATIN CALCIUM 40 MG PO TABS
40.0000 mg | ORAL_TABLET | Freq: Every day | ORAL | Status: DC
  Administered 2021-06-06 – 2021-06-09 (×4): 40 mg via ORAL
  Filled 2021-06-06 (×4): qty 1

## 2021-06-06 MED ORDER — APIXABAN 5 MG PO TABS
5.0000 mg | ORAL_TABLET | Freq: Two times a day (BID) | ORAL | Status: DC
  Administered 2021-06-06 – 2021-06-10 (×8): 5 mg via ORAL
  Filled 2021-06-06 (×8): qty 1

## 2021-06-06 MED ORDER — ONDANSETRON HCL 4 MG/2ML IJ SOLN
4.0000 mg | Freq: Four times a day (QID) | INTRAMUSCULAR | Status: DC | PRN
  Administered 2021-06-06: 4 mg via INTRAVENOUS
  Filled 2021-06-06: qty 2

## 2021-06-06 MED ORDER — INSULIN ASPART 100 UNIT/ML IJ SOLN: 0.0000 [IU] | Freq: Three times a day (TID) | INTRAMUSCULAR | Status: DC

## 2021-06-06 MED ORDER — INFLUENZA VAC A&B SA ADJ QUAD 0.5 ML IM PRSY
0.5000 mL | PREFILLED_SYRINGE | INTRAMUSCULAR | Status: AC
  Administered 2021-06-07: 0.5 mL via INTRAMUSCULAR
  Filled 2021-06-06: qty 0.5

## 2021-06-06 MED ORDER — DOCUSATE SODIUM 100 MG PO CAPS
100.0000 mg | ORAL_CAPSULE | Freq: Two times a day (BID) | ORAL | Status: DC
  Administered 2021-06-06 – 2021-06-08 (×2): 100 mg via ORAL
  Filled 2021-06-06 (×6): qty 1

## 2021-06-06 MED ORDER — FUROSEMIDE 10 MG/ML IJ SOLN
40.0000 mg | Freq: Two times a day (BID) | INTRAMUSCULAR | Status: DC
  Administered 2021-06-06 – 2021-06-08 (×4): 40 mg via INTRAVENOUS
  Filled 2021-06-06 (×4): qty 4

## 2021-06-06 MED ORDER — ONDANSETRON HCL 4 MG PO TABS: 4.0000 mg | ORAL_TABLET | Freq: Four times a day (QID) | ORAL | Status: DC | PRN

## 2021-06-06 MED ORDER — HYDRALAZINE HCL 20 MG/ML IJ SOLN
10.0000 mg | Freq: Four times a day (QID) | INTRAMUSCULAR | Status: DC | PRN
  Administered 2021-06-06 – 2021-06-10 (×3): 10 mg via INTRAVENOUS
  Filled 2021-06-06 (×3): qty 1

## 2021-06-06 MED ORDER — FUROSEMIDE 10 MG/ML IJ SOLN
80.0000 mg | Freq: Once | INTRAMUSCULAR | Status: AC
  Administered 2021-06-06: 80 mg via INTRAVENOUS
  Filled 2021-06-06: qty 8

## 2021-06-06 NOTE — H&P (Signed)
History and Physical  [name]:[id number] DOB: 20-Dec-1940 DOA: 06/06/2021  PCP: [name], MD Patient coming from: Home  I have personally briefly reviewed patient's old medical records in [facility]   Chief Complaint: worsening SOB  HPI: Albert Powell is a 80 y.o. male with past medical history significant for A. fib, on Eliquis, CAD, hypertension, diabetes, diastolic heart failure who presents complaining of worsening shortness of breath at rest but more pronounced on exertion for the last 2 days.  Shortness of breath got worse today, he was not able to ambulate to the bathroom, because he got very short of breath.  He denies chest pain.  Denies increased weight.  Denies fever.  He report dry cough.  Several days ago cough was congested.  Wife also report that patient has been more irritable, because he has not been feeling well.   Evaluation in the ED: Sodium 136, creatinine 1.3, normal liver function test, BNP 1349, troponin 41, hemoglobin 16, COVID PCR negative.  X-ray no active disease, cardiomegaly. Patient was found to have a systolic blood pressure 221/105 subsequently decreased to 133/110. he received a dose of IV Lasix    Review of Systems: All systems reviewed and apart from history of presenting illness, are negative.  Past Medical History:  Diagnosis Date   Atrial fibrillation ([name]) 03/30/2020   Bradycardia, sinus 02/18/2013   CAD (coronary artery disease)    stent to RCA 2003 also had 40% lesion at that time; NUCLEAR STRESS TEST, 01/16/2010 - normal  now with cath 80-90% stenosis in LAD, will try medical therapy if no improvement PCI   Carotid artery disease ([name])    Cataract    CHF (congestive heart failure) ([name])    Claudication ([name])    LEA DUPLEX, 07/07/2008 - Normal   COPD (chronic obstructive pulmonary disease) ([name])    DDD (degenerative disc disease) 2008   Diabetes mellitus    GERD (gastroesophageal reflux disease)    H/O  hiatal hernia    History of colonoscopy 2004   finding of tics and AMV only no polpys   Hyperlipidemia 02/05/2013   Hypertension    Onychomycosis    Rosacea    Tobacco abuse 02/05/2013   Past Surgical History:  Procedure Laterality Date   ABDOMINAL AORTOGRAM W/LOWER EXTREMITY N/A 03/30/2018   Procedure: ABDOMINAL AORTOGRAM W/LOWER EXTREMITY;  Surgeon: Nada Libman, MD;  Location: [facility];  Service: Cardiovascular;  Laterality: N/A;   ABDOMINAL AORTOGRAM W/LOWER EXTREMITY Bilateral 08/27/2020   Procedure: ABDOMINAL AORTOGRAM W/LOWER EXTREMITY;  Surgeon: [name], MD;  Location: [facility];  Service: Cardiovascular;  Laterality: Bilateral;   APPENDECTOMY     CARDIAC CATHETERIZATION  08/29/2002   2-vessel CAD with high-grade stenoses in RCA; stenting to RCA   CARDIAC CATHETERIZATION  2014   80-90% lesion will try medical therapy   CARDIAC SURGERY     stents  2003   CARDIOVERSION N/A 11/01/2013   Procedure: Dimple Nanas COMPRESSION;  Surgeon: [name], MD;  Location: [facility] CATH LAB;  Service: Cardiovascular;  Laterality: N/A;   CAROTID ANGIOGRAM N/A 04/25/2013   Procedure: CAROTID ANGIOGRAM;  Surgeon: [name], MD;  Location: [facility] CATH LAB;  Service: Cardiovascular;  Laterality: N/A;   CAROTID ENDARTERECTOMY     COLON RESECTION  3/04, 6/04    1 bleeding diverticulitis, 2 complete colectomy   COLON SURGERY  2005   renal pouch rectal anastimosis   CORONARY  ANGIOPLASTY WITH STENT PLACEMENT  09/2002   2 stents to RCA   ENDARTERECTOMY Left 06/02/2013   Procedure: ENDARTERECTOMY CAROTID-LEFT;  Surgeon: Nada Libman, MD;  Location: [facility] OR;  Service: Vascular;  Laterality: Left;   INTRAVASCULAR PRESSURE WIRE/FFR STUDY N/A 04/02/2020   Procedure: INTRAVASCULAR PRESSURE WIRE/FFR STUDY;  Surgeon: [name], MD;  Location: [facility];  Service: Cardiovascular;  Laterality: N/A;  DFR - RCA   LEFT HEART CATH AND CORONARY ANGIOGRAPHY N/A  04/02/2020   Procedure: LEFT HEART CATH AND CORONARY ANGIOGRAPHY;  Surgeon: [name], MD;  Location: [facility];  Service: Cardiovascular;  Laterality: N/A;   LEFT HEART CATHETERIZATION WITH CORONARY ANGIOGRAM N/A 02/08/2013   Procedure: LEFT HEART CATHETERIZATION WITH CORONARY ANGIOGRAM;  Surgeon: [name], MD;  Location: [facility] CATH LAB;  Service: Cardiovascular;  Laterality: N/A;   LOWER EXTREMITY ANGIOGRAM N/A 10/20/2013   Procedure: LOWER EXTREMITY ANGIOGRAM;  Surgeon: [name], MD;  Location: [facility] CATH LAB;  Service: Cardiovascular;  Laterality: N/A;   PATCH ANGIOPLASTY Left 06/02/2013   Procedure: PATCH ANGIOPLASTY;  Surgeon: Nada Libman, MD;  Location: [facility] OR;  Service: Vascular;  Laterality: Left;   PERIPHERAL VASCULAR INTERVENTION Bilateral 03/30/2018   Procedure: PERIPHERAL VASCULAR INTERVENTION;  Surgeon: Nada Libman, MD;  Location: [facility];  Service: Cardiovascular;  Laterality: Bilateral;  common iliacs   TONSILLECTOMY     Social History:  reports that he quit smoking about 8 years ago. His smoking use included cigarettes. He has a 42.75 pack-year smoking history. He quit smokeless tobacco use about 8 years ago. He reports current alcohol use of about 1.0 standard drink per week. He reports that he does not use drugs.   Allergies  Allergen Reactions   Fentanyl Other (See Comments)    Behavioral changes   Gabapentin Other (See Comments)    ankle swells   Lisinopril Other (See Comments)    unknown   Lyrica [Pregabalin] Other (See Comments)    unknown   Metformin Diarrhea   Propofol Other (See Comments)    Heart rate dropped    Family History  Problem Relation Age of Onset   Heart disease Father        heart attack at 18   Heart attack Father    Hyperlipidemia Father    Colonic polyp Mother        that bled out   COPD Mother    Diabetes Mother    Heart disease Sister    Colonic polyp Sister    Stroke Maternal Grandfather     Heart attack Paternal Grandfather    Other Neg Hx        hypogonadism    Prior to Admission medications   Medication Sig Start Date End Date Taking? Authorizing Provider  apixaban (ELIQUIS) 5 MG TABS tablet Take 1 tablet (5 mg total) by mouth 2 (two) times daily. 03/21/21   Angelita Ingles, MD  atorvastatin (LIPITOR) 40 MG tablet Take 40 mg by mouth at bedtime.     [provider]  clopidogrel (PLAVIX) 75 MG tablet Take 1 tablet (75 mg total) by mouth daily. 01/04/21   Azucena Fallen, MD  dapagliflozin propanediol (FARXIGA) 10 MG TABS tablet Take 1 tablet (10 mg total) by mouth daily before breakfast. 04/25/21   Angelita Ingles, MD  escitalopram (LEXAPRO) 20 MG tablet Take 20 mg by mouth daily. 01/18/21   [provider]  furosemide (LASIX)  20 MG tablet TAKE 1 TABLET BY MOUTH EVERY DAY AS NEEDED 04/15/21   Angelita Ingles, MD  isosorbide mononitrate (IMDUR) 60 MG 24 hr tablet Take 1 tablet (60 mg total) by mouth daily. 01/04/21   Azucena Fallen, MD  metoprolol tartrate (LOPRESSOR) 25 MG tablet Take 1 tablet (25 mg total) by mouth 2 (two) times daily. 01/03/21   Azucena Fallen, MD  OZEMPIC, 1 MG/DOSE, 2 MG/1.5ML SOPN Inject 2.5 mg into the skin once a week.  03/27/20   [provider]  pantoprazole (PROTONIX) 40 MG tablet Take 40 mg by mouth daily.     [provider]  Polyethyl Glycol-Propyl Glycol (SYSTANE FREE OP) Place 1 drop into both eyes daily as needed (dry eyes).    [provider]  sacubitril-valsartan (ENTRESTO) 49-51 MG Take 1 tablet by mouth 2 (two) times daily. 05/09/21   Bensimhon, Bevelyn Buckles, MD  spironolactone (ALDACTONE) 25 MG tablet Take 0.5 tablets (12.5 mg total) by mouth daily. 04/25/21 07/24/21  Angelita Ingles, MD   Physical Exam: Vitals:   06/06/21 1600 06/06/21 1615 06/06/21 1651 06/06/21 1701  BP: (!) 198/114 (!) 212/120 (!) 133/110 (!) 206/111  Pulse: 80 80 81 80  Resp:    18  Temp:      TempSrc:       SpO2: 98% 92% 95% 94%  Weight:      Height:        General exam: Moderately built and nourished patient, lying comfortably supine on the gurney in no obvious distress. Head, eyes and ENT: Nontraumatic and normocephalic. Pupils equally reacting to light and accommodation. Oral mucosa moist. Neck: Supple.  Thick neck difficult to evaluate for JVD , carotid bruit or thyromegaly. Lymphatics: No lymphadenopathy. Respiratory system: No increased work of breathing.  Bilateral crackles Cardiovascular system: S1 and S2 heard, IRR.  Gastrointestinal system: Abdomen is nondistended, soft and nontender. Normal bowel sounds heard. No organomegaly or masses appreciated. Central nervous system: Alert and oriented.  Follows commands Extremities: Symmetric 5 x 5 power. Peripheral pulses symmetrically felt.  Skin: No rashes or acute findings. Musculoskeletal system: Negative exam. Psychiatry: Pleasant and cooperative.   Labs on Admission:  Basic Metabolic Panel: Recent Labs  Lab 06/06/21 1500  NA 136  K 3.7  CL 99  CO2 28  GLUCOSE 145*  BUN 20  CREATININE 1.36*  CALCIUM 9.2   Liver Function Tests: Recent Labs  Lab 06/06/21 1500  AST 23  ALT 20  ALKPHOS 75  BILITOT 0.6  PROT 7.0  ALBUMIN 3.7   No results for input(s): LIPASE, AMYLASE in the last 168 hours. No results for input(s): AMMONIA in the last 168 hours. CBC: Recent Labs  Lab 06/06/21 1500  WBC 8.2  NEUTROABS 6.0  HGB 16.0  HCT 48.4  MCV 87.1  PLT 229   Cardiac Enzymes: No results for input(s): CKTOTAL, CKMB, CKMBINDEX, TROPONINI in the last 168 hours.  BNP (last 3 results) No results for input(s): PROBNP in the last 8760 hours. CBG: No results for input(s): GLUCAP in the last 168 hours.  Radiological Exams on Admission: DG Chest Portable 1 View  Result Date: 06/06/2021 CLINICAL DATA:  Shortness of breath. EXAM: PORTABLE CHEST 1 VIEW COMPARISON:  Chest x-ray 03/10/2021. FINDINGS: The heart is enlarged,  unchanged. There is no focal lung consolidation, pleural effusion or pneumothorax. Thoracic stimulator device is again noted. There are atherosclerotic calcifications of the aortic arch. No acute fractures are seen. IMPRESSION: No active disease.  Stable cardiomegaly. Electronically Signed   By: Darliss Cheney M.D.   On: 06/06/2021 16:23    EKG: Independently reviewed.  Sinus rhythm, prolonged PR, no ST elevation  Assessment/Plan Active Problems:   Type 2 diabetes mellitus ([name])   CAD hx of 2 stents to RCA in 2003,  80-90% stenosis of LAD/CFX June 2014- medical Rx   Peripheral arterial disease ([name])   Hypertensive emergency   CHF (congestive heart failure) ([name])   Acute exacerbation of CHF (congestive heart failure) ([name])   1-Acute on chronic diastolic heart failure exacerbation: Presented with worsening shortness of breath on exertion, chest x-ray with cardiomegaly, BNP elevated 1300, bilateral crackles on auscultation.  He received 80 mg of IV Lasix in the ED. Plan  continue with 40 mg IV Lasix twice daily Blood pressure has been elevated, plan to continue with Imdur, Entresto.  Could consider restart spironolactone tomorrow if renal function is stable. Daily weight, strict I's and O's.  2-Hypertensive urgency: Reports shortness of breath, mild elevation of troponin and elevated BNP. Has received 80 mg of IV Lasix. I have resumed his Entresto, Imdur.  I have order IV as needed hydralazine. He report compliance with medications.  Could start Norvasc if BP continue to be elevated.   3-mild elevation of troponin/History of CAD, sp stent.   In the setting of heart failure exacerbation and hypertensive urgency On statins.  Patient is not sure that he was taking plavix. Await med rec.   4-Diabetes type 2: Hold Farxiga for now.  Sliding scale insulin 5-Depression: Continue with Lexapro.  6-CKD stage IIIA;  Cr ranges 1.4---1.3 Stable. Monitor.  7-Paroxysmal A fib:  Continue with  eliquis and metoprolol.  8-History of COPD; No wheezing on lung exam.   DVT Prophylaxis: Eliquis Code Status: Patient wishes to be a full code Family Communication: Care discussed with wife who was at bedside Disposition Plan: Admit for IV Lasix and blood pressure control  Time spent: 75 minutes.       Alba Cory MD [facility]   06/06/2021, 5:25 PM

## 2021-06-06 NOTE — ED Triage Notes (Signed)
Pt arrives via EMS for c/o 3 days of exertional dyspnea. Pt believes he is having an exacerbation of CHF.

## 2021-06-06 NOTE — Progress Notes (Signed)
   06/06/21 2020  Assess: MEWS Score  Temp 98.3 F (36.8 C)  BP (!) 207/94  Pulse Rate (!) 101  ECG Heart Rate (!) 112  Resp (!) 22  Level of Consciousness Alert  SpO2 94 %  O2 Device Room Air  Assess: if the MEWS score is Yellow or Red  Were vital signs taken at a resting state? Yes  Focused Assessment No change from prior assessment  Early Detection of Sepsis Score *See Row Information* Low  MEWS guidelines implemented *See Row Information* Yes  Treat  MEWS Interventions Escalated (See documentation below)  Pain Scale 0-10  Pain Score 0  Take Vital Signs  Increase Vital Sign Frequency  Red: Q 1hr X 4 then Q 4hr X 4, if remains red, continue Q 4hrs  Escalate  MEWS: Escalate Red: discuss with charge nurse/RN and provider, consider discussing with RRT  Notify: Charge Nurse/RN  Name of Charge Nurse/RN Notified Heather RN  Date Charge Nurse/RN Notified 06/06/21  Time Charge Nurse/RN Notified 2025  Document  Patient Outcome Not stable and remains on department  Progress note created (see row info) Yes

## 2021-06-06 NOTE — ED Notes (Signed)
Hospitalist at bedside with patient.

## 2021-06-06 NOTE — ED Provider Notes (Signed)
[facility] EMERGENCY DEPARTMENT Provider Note   CSN: [id number] Arrival date & time: 06/06/21  1438     History Chief Complaint  Patient presents with   Shortness of Breath    Albert Powell is a 80 y.o. male.  History of CAD, hypertension, heart failure with worsening shortness of breath over the last several days.  Leg swelling.  Blood pressure uncontrolled.  The history is provided by the patient.  Shortness of Breath Severity:  Moderate Onset quality:  Gradual Timing:  Constant Progression:  Worsening Chronicity:  Recurrent Relieved by:  Nothing Worsened by:  Exertion Associated symptoms: no abdominal pain, no chest pain, no cough, no ear pain, no fever, no rash, no sore throat and no vomiting       Past Medical History:  Diagnosis Date   Atrial fibrillation ([name]) 03/30/2020   Bradycardia, sinus 02/18/2013   CAD (coronary artery disease)    stent to RCA 2003 also had 40% lesion at that time; NUCLEAR STRESS TEST, 01/16/2010 - normal  now with cath 80-90% stenosis in LAD, will try medical therapy if no improvement PCI   Carotid artery disease ([name])    Cataract    CHF (congestive heart failure) ([name])    Claudication ([name])    LEA DUPLEX, 07/07/2008 - Normal   COPD (chronic obstructive pulmonary disease) ([name])    DDD (degenerative disc disease) 2008   Diabetes mellitus    GERD (gastroesophageal reflux disease)    H/O hiatal hernia    History of colonoscopy 2004   finding of tics and AMV only no polpys   Hyperlipidemia 02/05/2013   Hypertension    Onychomycosis    Rosacea    Tobacco abuse 02/05/2013    Patient Active Problem List   Diagnosis Date Noted   CHF (congestive heart failure) ([name]) 03/10/2021   Acute hypoxemic respiratory failure ([name]) 03/10/2021   Uncontrolled hypertension 03/10/2021   Hypertensive emergency 01/01/2021   ACS (acute coronary syndrome) ([name])    AKI (acute kidney injury) ([name])    Atrial fibrillation with RVR  ([name]) 03/30/2020   Contusion of right hand 11/12/2018   Primary osteoarthritis of both first carpometacarpal joints 11/12/2018   Pain in joint of right shoulder 10/05/2018   Pain in right hand 10/05/2018   Hematoma 03/30/2018   Degeneration of lumbar intervertebral disc 12/21/2017   Lumbar radiculopathy 12/21/2017   Vertigo 04/09/2017   Hypogonadism in male 12/07/2014   Screening for prostate cancer 12/07/2014   Claudication of right lower extremity ([name]) 10/20/2013   Claudication in peripheral vascular disease- Rt leg 09/20/2013   Obesity (BMI 30-39.9)- negative sleep study in the past 09/20/2013   Occlusion and stenosis of carotid artery without mention of cerebral infarction 05/23/2013   Peripheral arterial disease ([name]) 05/18/2013   Carotid artery disease ([name]) 04/14/2013   Bradycardia, sinus 02/18/2013   Hyperlipidemia 02/05/2013   Tobacco abuse 02/05/2013   Chest pain, unstable angina, negative MI 02/04/2013   Type 2 diabetes mellitus ([name]) 02/04/2013   CAD hx of 2 stents to RCA in 2003,  80-90% stenosis of LAD/CFX June 2014- medical Rx 02/04/2013   Benign essential hypertension 07/10/2011   Incisional hernia 05/22/2011    Past Surgical History:  Procedure Laterality Date   ABDOMINAL AORTOGRAM W/LOWER EXTREMITY N/A 03/30/2018   Procedure: ABDOMINAL AORTOGRAM W/LOWER EXTREMITY;  Surgeon: Nada Libman, MD;  Location: [facility] INVASIVE CV LAB;  Service: Cardiovascular;  Laterality: N/A;   ABDOMINAL AORTOGRAM W/LOWER EXTREMITY Bilateral 08/27/2020  Procedure: ABDOMINAL AORTOGRAM W/LOWER EXTREMITY;  Surgeon: [name], MD;  Location: [facility] INVASIVE CV LAB;  Service: Cardiovascular;  Laterality: Bilateral;   APPENDECTOMY     CARDIAC CATHETERIZATION  08/29/2002   2-vessel CAD with high-grade stenoses in RCA; stenting to RCA   CARDIAC CATHETERIZATION  2014   80-90% lesion will try medical therapy   CARDIAC SURGERY     stents  2003   CARDIOVERSION N/A 11/01/2013   Procedure:  Dimple Nanas COMPRESSION;  Surgeon: [name], MD;  Location: [facility] CATH LAB;  Service: Cardiovascular;  Laterality: N/A;   CAROTID ANGIOGRAM N/A 04/25/2013   Procedure: CAROTID ANGIOGRAM;  Surgeon: [name], MD;  Location: [facility] CATH LAB;  Service: Cardiovascular;  Laterality: N/A;   CAROTID ENDARTERECTOMY     COLON RESECTION  3/04, 6/04    1 bleeding diverticulitis, 2 complete colectomy   COLON SURGERY  2005   renal pouch rectal anastimosis   CORONARY ANGIOPLASTY WITH STENT PLACEMENT  09/2002   2 stents to RCA   ENDARTERECTOMY Left 06/02/2013   Procedure: ENDARTERECTOMY CAROTID-LEFT;  Surgeon: Nada Libman, MD;  Location: [facility] OR;  Service: Vascular;  Laterality: Left;   INTRAVASCULAR PRESSURE WIRE/FFR STUDY N/A 04/02/2020   Procedure: INTRAVASCULAR PRESSURE WIRE/FFR STUDY;  Surgeon: [name], MD;  Location: [facility] INVASIVE CV LAB;  Service: Cardiovascular;  Laterality: N/A;  DFR - RCA   LEFT HEART CATH AND CORONARY ANGIOGRAPHY N/A 04/02/2020   Procedure: LEFT HEART CATH AND CORONARY ANGIOGRAPHY;  Surgeon: [name], MD;  Location: [facility] INVASIVE CV LAB;  Service: Cardiovascular;  Laterality: N/A;   LEFT HEART CATHETERIZATION WITH CORONARY ANGIOGRAM N/A 02/08/2013   Procedure: LEFT HEART CATHETERIZATION WITH CORONARY ANGIOGRAM;  Surgeon: [name], MD;  Location: [facility] CATH LAB;  Service: Cardiovascular;  Laterality: N/A;   LOWER EXTREMITY ANGIOGRAM N/A 10/20/2013   Procedure: LOWER EXTREMITY ANGIOGRAM;  Surgeon: [name], MD;  Location: [facility] CATH LAB;  Service: Cardiovascular;  Laterality: N/A;   PATCH ANGIOPLASTY Left 06/02/2013   Procedure: PATCH ANGIOPLASTY;  Surgeon: Nada Libman, MD;  Location: [facility] OR;  Service: Vascular;  Laterality: Left;   PERIPHERAL VASCULAR INTERVENTION Bilateral 03/30/2018   Procedure: PERIPHERAL VASCULAR INTERVENTION;  Surgeon: Nada Libman, MD;  Location: [facility] INVASIVE CV LAB;  Service: Cardiovascular;  Laterality: Bilateral;  common  iliacs   TONSILLECTOMY         Family History  Problem Relation Age of Onset   Heart disease Father        heart attack at 13   Heart attack Father    Hyperlipidemia Father    Colonic polyp Mother        that bled out   COPD Mother    Diabetes Mother    Heart disease Sister    Colonic polyp Sister    Stroke Maternal Grandfather    Heart attack Paternal Grandfather    Other Neg Hx        hypogonadism    Social History   Tobacco Use   Smoking status: Former    Packs/day: 0.75    Years: 57.00    Pack years: 42.75    Types: Cigarettes    Quit date: 04/06/2013    Years since quitting: 8.1   Smokeless tobacco: Former    Quit date: 04/14/2013   Tobacco comments:    pt states that he is using the vapor cigs  Vaping Use   Vaping Use: Every day  Substance  Use Topics   Alcohol use: Yes    Alcohol/week: 1.0 standard drink    Types: 1 Glasses of wine per week    Comment: "very little"   Drug use: No    Home Medications Prior to Admission medications   Medication Sig Start Date End Date Taking? Authorizing Provider  apixaban (ELIQUIS) 5 MG TABS tablet Take 1 tablet (5 mg total) by mouth 2 (two) times daily. 03/21/21   Angelita Ingles, MD  atorvastatin (LIPITOR) 40 MG tablet Take 40 mg by mouth at bedtime.     [provider]  clopidogrel (PLAVIX) 75 MG tablet Take 1 tablet (75 mg total) by mouth daily. 01/04/21   Azucena Fallen, MD  dapagliflozin propanediol (FARXIGA) 10 MG TABS tablet Take 1 tablet (10 mg total) by mouth daily before breakfast. 04/25/21   Angelita Ingles, MD  escitalopram (LEXAPRO) 20 MG tablet Take 20 mg by mouth daily. 01/18/21   [provider]  furosemide (LASIX) 20 MG tablet TAKE 1 TABLET BY MOUTH EVERY DAY AS NEEDED 04/15/21   Angelita Ingles, MD  isosorbide mononitrate (IMDUR) 60 MG 24 hr tablet Take 1 tablet (60 mg total) by mouth daily. 01/04/21   Azucena Fallen, MD  metoprolol tartrate (LOPRESSOR) 25 MG tablet Take 1  tablet (25 mg total) by mouth 2 (two) times daily. 01/03/21   Azucena Fallen, MD  OZEMPIC, 1 MG/DOSE, 2 MG/1.5ML SOPN Inject 2.5 mg into the skin once a week.  03/27/20   [provider]  pantoprazole (PROTONIX) 40 MG tablet Take 40 mg by mouth daily.     [provider]  Polyethyl Glycol-Propyl Glycol (SYSTANE FREE OP) Place 1 drop into both eyes daily as needed (dry eyes).    [provider]  sacubitril-valsartan (ENTRESTO) 49-51 MG Take 1 tablet by mouth 2 (two) times daily. 05/09/21   Bensimhon, Bevelyn Buckles, MD  spironolactone (ALDACTONE) 25 MG tablet Take 0.5 tablets (12.5 mg total) by mouth daily. 04/25/21 07/24/21  Angelita Ingles, MD    Allergies    Fentanyl, Gabapentin, Lisinopril, Lyrica [pregabalin], Metformin, and Propofol  Review of Systems   Review of Systems  Constitutional:  Negative for chills and fever.  HENT:  Negative for ear pain and sore throat.   Eyes:  Negative for pain and visual disturbance.  Respiratory:  Positive for shortness of breath. Negative for cough.   Cardiovascular:  Negative for chest pain and palpitations.  Gastrointestinal:  Negative for abdominal pain and vomiting.  Genitourinary:  Negative for dysuria and hematuria.  Musculoskeletal:  Negative for arthralgias and back pain.  Skin:  Negative for color change and rash.  Neurological:  Negative for seizures and syncope.  All other systems reviewed and are negative.  Physical Exam Updated Vital Signs  ED Triage Vitals  Enc Vitals Group     BP 06/06/21 1441 (!) 221/105     Pulse Rate 06/06/21 1441 88     Resp 06/06/21 1441 13     Temp 06/06/21 1441 98.4 F (36.9 C)     Temp Source 06/06/21 1441 Oral     SpO2 06/06/21 1441 99 %     Weight 06/06/21 1442 220 lb (99.8 kg)     Height 06/06/21 1442 5\' 9"  (1.753 m)     Head Circumference --      Peak Flow --      Pain Score 06/06/21 1442 0     Pain Loc --  Pain Edu? --      Excl. in GC? --     Physical  Exam Vitals and nursing note reviewed.  Constitutional:      General: He is not in acute distress.    Appearance: He is well-developed. He is not ill-appearing.  HENT:     Head: Normocephalic and atraumatic.     Mouth/Throat:     Mouth: Mucous membranes are moist.  Eyes:     Extraocular Movements: Extraocular movements intact.     Conjunctiva/sclera: Conjunctivae normal.     Pupils: Pupils are equal, round, and reactive to light.  Cardiovascular:     Rate and Rhythm: Normal rate and regular rhythm.     Pulses: Normal pulses.     Heart sounds: Normal heart sounds. No murmur heard. Pulmonary:     Effort: Pulmonary effort is normal. No respiratory distress.     Breath sounds: Rales present. No decreased breath sounds, wheezing or rhonchi.  Abdominal:     Palpations: Abdomen is soft.     Tenderness: There is no abdominal tenderness.  Musculoskeletal:     Cervical back: Normal range of motion and neck supple.     Right lower leg: Edema (1+) present.     Left lower leg: Edema (1+) present.  Skin:    General: Skin is warm and dry.     Capillary Refill: Capillary refill takes less than 2 seconds.  Neurological:     General: No focal deficit present.     Mental Status: He is alert.  Psychiatric:        Mood and Affect: Mood normal.    ED Results / Procedures / Treatments   Labs (all labs ordered are listed, but only abnormal results are displayed) Labs Reviewed  CBC WITH DIFFERENTIAL/PLATELET - Abnormal; Notable for the following components:      Result Value   Abs Immature Granulocytes 0.09 (*)    All other components within normal limits  COMPREHENSIVE METABOLIC PANEL - Abnormal; Notable for the following components:   Glucose, Bld 145 (*)    Creatinine, Ser 1.36 (*)    GFR, Estimated 53 (*)    All other components within normal limits  BRAIN NATRIURETIC PEPTIDE - Abnormal; Notable for the following components:   B Natriuretic Peptide 1,349.7 (*)    All other components  within normal limits  TROPONIN I (HIGH SENSITIVITY) - Abnormal; Notable for the following components:   Troponin I (High Sensitivity) 41 (*)    All other components within normal limits  RESP PANEL BY RT-PCR (FLU A&B, COVID) [id number]  TROPONIN I (HIGH SENSITIVITY)    EKG EKG Interpretation  Date/Time:  Thursday June 06 2021 14:42:16 EDT Ventricular Rate:  85 PR Interval:  240 QRS Duration: 74 QT Interval:  405 QTC Calculation: 482 R Axis:   -75 Text Interpretation: Sinus or ectopic atrial rhythm Prolonged PR interval Inferior infarct, old Confirmed by Virgina Norfolk [id number]) on 06/06/2021 2:56:36 PM  Radiology DG Chest Portable 1 View  Result Date: 06/06/2021 CLINICAL DATA:  Shortness of breath. EXAM: PORTABLE CHEST 1 VIEW COMPARISON:  Chest x-ray 03/10/2021. FINDINGS: The heart is enlarged, unchanged. There is no focal lung consolidation, pleural effusion or pneumothorax. Thoracic stimulator device is again noted. There are atherosclerotic calcifications of the aortic arch. No acute fractures are seen. IMPRESSION: No active disease. Stable cardiomegaly. Electronically Signed   By: Darliss Cheney M.D.   On: 06/06/2021 16:23    Procedures Procedures   Medications Ordered in  ED Medications  hydrALAZINE (APRESOLINE) injection 10 mg (has no administration in time range)  furosemide (LASIX) injection 80 mg (80 mg Intravenous Given 06/06/21 1652)    ED Course  I have reviewed the triage vital signs and the nursing notes.  Pertinent labs & imaging results that were available during my care of the patient were reviewed by me and considered in my medical decision making (see chart for details).    MDM Rules/Calculators/A&P                           SKANDA WORLDS is here with shortness of breath.  Patient with hypertension in the 200s but otherwise normal vitals.  No chest pain.  Progressive shortness of breath with leg swelling feels like his prior heart failure symptoms.  States  he has been compliant with his medication including his blood thinner.  EKG shows sinus rhythm.  No ischemic changes.  Does appear to be volume overloaded on exam.  Probably this is secondary to CKD and poorly controlled hypertension.  Chest x-ray showed no obvious volume overload but clinically he looks overloaded.  BNP elevated to 1300 and troponin mildly elevated as well.  Creatinine appears to be at baseline.  Overall suspect volume overload in the setting of uncontrolled hypertension and CKD.  Patient given dose of IV Lasix as well as hydralazine and to be admitted to medicine service for further care.  This chart was dictated using voice recognition software.  Despite best efforts to proofread,  errors can occur which can change the documentation meaning.   Final Clinical Impression(s) / ED Diagnoses Final diagnoses:  Acute on chronic congestive heart failure, unspecified heart failure type [facility])    Rx / DC Orders ED Discharge Orders     None        Virgina Norfolk, DO 06/06/21 1701

## 2021-06-07 ENCOUNTER — Inpatient Hospital Stay (HOSPITAL_COMMUNITY): Payer: Medicare Other

## 2021-06-07 ENCOUNTER — Encounter (HOSPITAL_COMMUNITY): Payer: Self-pay | Admitting: Internal Medicine

## 2021-06-07 DIAGNOSIS — Z20822 Contact with and (suspected) exposure to covid-19: Secondary | ICD-10-CM | POA: Diagnosis present

## 2021-06-07 DIAGNOSIS — E1122 Type 2 diabetes mellitus with diabetic chronic kidney disease: Secondary | ICD-10-CM | POA: Diagnosis present

## 2021-06-07 DIAGNOSIS — I5031 Acute diastolic (congestive) heart failure: Secondary | ICD-10-CM

## 2021-06-07 DIAGNOSIS — I739 Peripheral vascular disease, unspecified: Secondary | ICD-10-CM

## 2021-06-07 DIAGNOSIS — I161 Hypertensive emergency: Secondary | ICD-10-CM | POA: Diagnosis present

## 2021-06-07 DIAGNOSIS — N1831 Chronic kidney disease, stage 3a: Secondary | ICD-10-CM | POA: Diagnosis present

## 2021-06-07 DIAGNOSIS — Z955 Presence of coronary angioplasty implant and graft: Secondary | ICD-10-CM | POA: Diagnosis not present

## 2021-06-07 DIAGNOSIS — G9389 Other specified disorders of brain: Secondary | ICD-10-CM | POA: Diagnosis present

## 2021-06-07 DIAGNOSIS — Z23 Encounter for immunization: Secondary | ICD-10-CM | POA: Diagnosis present

## 2021-06-07 DIAGNOSIS — I5033 Acute on chronic diastolic (congestive) heart failure: Secondary | ICD-10-CM | POA: Diagnosis present

## 2021-06-07 DIAGNOSIS — I509 Heart failure, unspecified: Secondary | ICD-10-CM | POA: Diagnosis present

## 2021-06-07 DIAGNOSIS — E1151 Type 2 diabetes mellitus with diabetic peripheral angiopathy without gangrene: Secondary | ICD-10-CM | POA: Diagnosis present

## 2021-06-07 DIAGNOSIS — E669 Obesity, unspecified: Secondary | ICD-10-CM | POA: Diagnosis present

## 2021-06-07 DIAGNOSIS — N179 Acute kidney failure, unspecified: Secondary | ICD-10-CM | POA: Diagnosis present

## 2021-06-07 DIAGNOSIS — Z794 Long term (current) use of insulin: Secondary | ICD-10-CM

## 2021-06-07 DIAGNOSIS — J449 Chronic obstructive pulmonary disease, unspecified: Secondary | ICD-10-CM | POA: Diagnosis present

## 2021-06-07 DIAGNOSIS — I5032 Chronic diastolic (congestive) heart failure: Secondary | ICD-10-CM | POA: Diagnosis present

## 2021-06-07 DIAGNOSIS — Z6831 Body mass index (BMI) 31.0-31.9, adult: Secondary | ICD-10-CM | POA: Diagnosis not present

## 2021-06-07 DIAGNOSIS — Z87891 Personal history of nicotine dependence: Secondary | ICD-10-CM | POA: Diagnosis not present

## 2021-06-07 DIAGNOSIS — I130 Hypertensive heart and chronic kidney disease with heart failure and stage 1 through stage 4 chronic kidney disease, or unspecified chronic kidney disease: Secondary | ICD-10-CM | POA: Diagnosis present

## 2021-06-07 DIAGNOSIS — Z7901 Long term (current) use of anticoagulants: Secondary | ICD-10-CM | POA: Diagnosis not present

## 2021-06-07 DIAGNOSIS — K219 Gastro-esophageal reflux disease without esophagitis: Secondary | ICD-10-CM | POA: Diagnosis present

## 2021-06-07 DIAGNOSIS — G9341 Metabolic encephalopathy: Secondary | ICD-10-CM | POA: Diagnosis present

## 2021-06-07 DIAGNOSIS — I248 Other forms of acute ischemic heart disease: Secondary | ICD-10-CM | POA: Diagnosis present

## 2021-06-07 DIAGNOSIS — I480 Paroxysmal atrial fibrillation: Secondary | ICD-10-CM | POA: Diagnosis present

## 2021-06-07 DIAGNOSIS — E785 Hyperlipidemia, unspecified: Secondary | ICD-10-CM | POA: Diagnosis present

## 2021-06-07 DIAGNOSIS — I2510 Atherosclerotic heart disease of native coronary artery without angina pectoris: Secondary | ICD-10-CM | POA: Diagnosis present

## 2021-06-07 DIAGNOSIS — T502X5A Adverse effect of carbonic-anhydrase inhibitors, benzothiadiazides and other diuretics, initial encounter: Secondary | ICD-10-CM | POA: Diagnosis present

## 2021-06-07 DIAGNOSIS — F32A Depression, unspecified: Secondary | ICD-10-CM | POA: Diagnosis present

## 2021-06-07 LAB — BASIC METABOLIC PANEL
Anion gap: 11 (ref 5–15)
BUN: 20 mg/dL (ref 8–23)
CO2: 28 mmol/L (ref 22–32)
Calcium: 8.8 mg/dL — ABNORMAL LOW (ref 8.9–10.3)
Chloride: 99 mmol/L (ref 98–111)
Creatinine, Ser: 1.55 mg/dL — ABNORMAL HIGH (ref 0.61–1.24)
GFR, Estimated: 45 mL/min — ABNORMAL LOW (ref >60)
Glucose, Bld: 152 mg/dL — ABNORMAL HIGH (ref 70–99)
Potassium: 3.5 mmol/L (ref 3.5–5.1)
Sodium: 138 mmol/L (ref 135–145)

## 2021-06-07 LAB — GLUCOSE, CAPILLARY
Glucose-Capillary: 231 mg/dL — ABNORMAL HIGH (ref 70–99)
Glucose-Capillary: 186 mg/dL — ABNORMAL HIGH (ref 70–99)
Glucose-Capillary: 224 mg/dL — ABNORMAL HIGH (ref 70–99)
Glucose-Capillary: 117 mg/dL — ABNORMAL HIGH (ref 70–99)
Glucose-Capillary: 205 mg/dL — ABNORMAL HIGH (ref 70–99)

## 2021-06-07 LAB — ECHOCARDIOGRAM COMPLETE
Area-P 1/2: 2.62 cm2
Calc EF: 51.5 %
Height: 69 in
S' Lateral: 3.3 cm
Single Plane A2C EF: 45.6 %
Single Plane A4C EF: 56.1 %
Weight: 3398.4 oz

## 2021-06-07 MED ORDER — SPIRONOLACTONE 12.5 MG HALF TABLET: 12.5000 mg | ORAL_TABLET | Freq: Every day | ORAL | Status: DC

## 2021-06-07 MED ORDER — DAPAGLIFLOZIN PROPANEDIOL 10 MG PO TABS
10.0000 mg | ORAL_TABLET | Freq: Every day | ORAL | Status: DC
  Administered 2021-06-07 – 2021-06-10 (×4): 10 mg via ORAL
  Filled 2021-06-07 (×4): qty 1

## 2021-06-07 MED ORDER — POTASSIUM CHLORIDE CRYS ER 20 MEQ PO TBCR
40.0000 meq | EXTENDED_RELEASE_TABLET | Freq: Once | ORAL | Status: AC
  Administered 2021-06-07: 40 meq via ORAL
  Filled 2021-06-07: qty 2

## 2021-06-07 MED ORDER — CLOPIDOGREL BISULFATE 75 MG PO TABS
75.0000 mg | ORAL_TABLET | Freq: Every day | ORAL | Status: DC
  Administered 2021-06-07 – 2021-06-10 (×4): 75 mg via ORAL
  Filled 2021-06-07 (×4): qty 1

## 2021-06-07 NOTE — Evaluation (Signed)
Physical Therapy Evaluation Patient Details Name: Albert Powell MRN: [id number] DOB: 03-22-1941 Today's Date: 06/07/2021  History of Present Illness  80yo male who presented on 9/22 with progressive SOB. Covid negative. Admitted with acute on chronic diastolic heart failure exacerbation, HTN urgency. PMH Afib, CAD, CHF, claudication, COPD, DM, HLD, HTN, cardiac stents, colon surgery  Clinical Impression   Patient received in bed, pleasant and cooperative, just very fatigued as he's not been sleeping well in the hospital. Able to move very well today with RW, but did need cues for safe use of device. SpO2 as low as 86% reclined in bed on RA with good pleth, but once upright and moving, SpO2 no lower than 94% on room air. Has multiple tests/procedures scheduled for today- left in bed in chair position with all needs met, spouse present. Feel he would really benefit from increased challenge in skilled OP PT setting at DC.     Recommendations for follow up therapy are one component of a multi-disciplinary discharge planning process, led by the attending physician.  Recommendations may be updated based on patient status, additional functional criteria and insurance authorization.  Follow Up Recommendations Outpatient PT    Equipment Recommendations  Rolling walker with 5" wheels    Recommendations for Other Services       Precautions / Restrictions Precautions Precautions: Other (comment) Precaution Comments: watch sats Restrictions Weight Bearing Restrictions: No      Mobility  Bed Mobility Overal bed mobility: Needs Assistance Bed Mobility: Supine to Sit;Sit to Supine     Supine to sit: Supervision;HOB elevated Sit to supine: Supervision;HOB elevated   General bed mobility comments: increased time and effort    Transfers Overall transfer level: Needs assistance Equipment used: Rolling walker (2 wheeled) Transfers: Sit to/from Stand Sit to Stand: Min guard          General transfer comment: min guard for safety from standard height surface, extra time and increased effort  Ambulation/Gait Ambulation/Gait assistance: Min guard Gait Distance (Feet): 60 Feet Assistive device: Rolling walker (2 wheeled) Gait Pattern/deviations: Step-through pattern;Trunk flexed;Wide base of support Gait velocity: decreased   General Gait Details: tends to push RW way too far ahead of him- but improved with cues. SPO2 no lower than 94% with gait training no room air  Stairs            Wheelchair Mobility    Modified Rankin (Stroke Patients Only)       Balance Overall balance assessment: Mild deficits observed, not formally tested                                           Pertinent Vitals/Pain Pain Assessment: No/denies pain    Home Living Family/patient expects to be discharged to:: Private residence Living Arrangements: Spouse/significant other Available Help at Discharge: Family;Available 24 hours/day Type of Home: Apartment Home Access: Level entry     Home Layout: One level Home Equipment: Cane - single point;Walker - 4 wheels;Shower seat Additional Comments: uses walker more often; still drives (Not much tho, wife does most of the driving), manages own meds (but per wife may be having difficulty with this); wife is taking over handling bills as he has forgotten some    Prior Function Level of Independence: Independent with assistive device(s)               Hand Dominance  Extremity/Trunk Assessment   Upper Extremity Assessment Upper Extremity Assessment: Defer to OT evaluation    Lower Extremity Assessment Lower Extremity Assessment: Generalized weakness    Cervical / Trunk Assessment Cervical / Trunk Assessment: Normal  Communication   Communication: No difficulties  Cognition Arousal/Alertness: Awake/alert Behavior During Therapy: WFL for tasks assessed/performed Overall Cognitive Status:  Within Functional Limits for tasks assessed                                 General Comments: slightly slow to answer, but generally WNL      General Comments      Exercises     Assessment/Plan    PT Assessment Patient needs continued PT services  PT Problem List Decreased strength;Decreased knowledge of use of DME;Obesity;Decreased activity tolerance;Decreased balance;Decreased mobility;Cardiopulmonary status limiting activity       PT Treatment Interventions DME instruction;Balance training;Gait training;Stair training;Functional mobility training;Patient/family education;Therapeutic activities;Therapeutic exercise    PT Goals (Current goals can be found in the Care Plan section)  Acute Rehab PT Goals Patient Stated Goal: get stronger PT Goal Formulation: With patient/family Time For Goal Achievement: 06/20/21 Potential to Achieve Goals: Good    Frequency Min 3X/week   Barriers to discharge        Co-evaluation               AM-PAC PT "6 Clicks" Mobility  Outcome Measure Help needed turning from your back to your side while in a flat bed without using bedrails?: A Little Help needed moving from lying on your back to sitting on the side of a flat bed without using bedrails?: A Little Help needed moving to and from a bed to a chair (including a wheelchair)?: A Little Help needed standing up from a chair using your arms (e.g., wheelchair or bedside chair)?: A Little Help needed to walk in hospital room?: A Little Help needed climbing 3-5 steps with a railing? : A Little 6 Click Score: 18    End of Session Equipment Utilized During Treatment: Gait belt Activity Tolerance: Patient tolerated treatment well Patient left: in bed;with call bell/phone within reach;with family/visitor present;Other (comment) (chair position) Nurse Communication: Mobility status PT Visit Diagnosis: Muscle weakness (generalized) (M62.81);Difficulty in walking, not elsewhere  classified (R26.2);Unsteadiness on feet (R26.81)    Time: 1023-1040 PT Time Calculation (min) (ACUTE ONLY): 17 min   Charges:   PT Evaluation $PT Eval Moderate Complexity: 1 Mod         Jacari Iannello U PT, DPT, PN2   Supplemental Physical Therapist [place]    Pager [phone] Acute Rehab Office [phone]

## 2021-06-07 NOTE — Progress Notes (Signed)
[facility] PROGRESS NOTE    Progress Note  Albert Powell  ZHY:[id number] DOB: 1941/06/18 DOA: 06/06/2021 PCP: [name], MD     Brief Narrative:   Albert Powell is an 80 y.o. male past medical history of A. fib on Eliquis, essential hypertension diabetes mellitus type 2, chronic diastolic heart failure presents with worsening shortness of breath at rest more pronounced over the last 2 days, in the ED was found to have a systolic blood pressure of 221/105, subsequently decreased to 133/110 after receiving IV Lasix   Assessment/Plan:   Acute on chronic diastolic heart failure: Chest x-ray showed cardiomegaly BNP elevated at 1300 crackles at bases. She was started on IV Lasix blood pressure improved. Continue Imdur and Entresto. Her creatinine is at baseline usually ranges 1.4-1.6. Continue IV Lasix we will start her on home dose of Aldactone.  Hypertensive urgency: In the setting of shortness of breath mildly elevated troponins likely demand ischemia. His home regimen was resumed Entresto, Imdur, metoprolol.  We will start Aldactone this morning.  Mild elevation in cardiac troponins: In the setting of heart failure exacerbation and hypertensive urgency, likely demand ischemia. Was started back on his Plavix continue metoprolol.  Diabetes mellitus type 2: With a last A1c of 8.4, restart procedure continue sliding scale insulin.  Depression: Continue Lexapro.  Chronic kidney disease stage IIIa: With a baseline creatinine running around 1.4-1.6 seems to be at baseline.  Paroxysmal atrial fibrillation: Rate controlled continue Eliquis and metoprolol.  History of COPD: No wheezing on physical exam.  Acute metabolic encephalopathy: Patient is slow to respond this morning, going back through the history he was able to provide a history on admission. Check an MRI of the brain, due to significant elevated blood pressure on admission I am concerned about  press syndrome, which could be the cause of his slow to response.  This morning he is very slow to respond not able to recall yesterdays events.   DVT prophylaxis: lovenox Family Communication:none Status is: Observation  The patient will require care spanning > 2 midnights and should be moved to inpatient because: Hemodynamically unstable  Dispo: The patient is from: Home              Anticipated d/c is to: Home              Patient currently is not medically stable to d/c.   Difficult to place patient No      Code Status:     Code Status Orders  (From admission, onward)           Start     Ordered   06/06/21 2009  Full code  Continuous        06/06/21 2009           Code Status History     Date Active Date Inactive Code Status Order ID Comments User Context   03/10/2021 0635 03/12/2021 1908 DNR [id number]  John Giovanni, MD ED   01/01/2021 1953 01/03/2021 1936 DNR [id number]  Charlsie Quest, MD ED   08/27/2020 1620 08/28/2020 0032 Full Code [id number]  [name], MD Inpatient   03/30/2020 1535 04/03/2020 1544 Full Code [id number]  Jonah Blue, MD ED   03/30/2018 1202 03/31/2018 1439 Full Code [id number]  Nada Libman, MD Inpatient   06/02/2013 1252 06/03/2013 1334 Full Code [id number]  Dara Lords, PA-C Inpatient   02/04/2013 2243 02/08/2013 1241 Full Code [id number]  Eduard Clos, MD Inpatient  IV Access:   Peripheral IV   Procedures and diagnostic studies:   DG Chest Portable 1 View  Result Date: 06/06/2021 CLINICAL DATA:  Shortness of breath. EXAM: PORTABLE CHEST 1 VIEW COMPARISON:  Chest x-ray 03/10/2021. FINDINGS: The heart is enlarged, unchanged. There is no focal lung consolidation, pleural effusion or pneumothorax. Thoracic stimulator device is again noted. There are atherosclerotic calcifications of the aortic arch. No acute fractures are seen. IMPRESSION: No active disease. Stable cardiomegaly. Electronically Signed   By:  Darliss Cheney M.D.   On: 06/06/2021 16:23     Medical Consultants:   None.   Subjective:    Albert Powell very slow to respond not able to provide an accurate history.  Objective:    Vitals:   06/06/21 2338 06/07/21 0033 06/07/21 0418 06/07/21 0541  BP: 137/82 128/66 119/61   Pulse: 92 73 67   Resp: 17 18 16    Temp: 98.3 F (36.8 C) 98.7 F (37.1 C) 97.9 F (36.6 C)   TempSrc: Oral Oral Oral   SpO2: 98% 91% 93%   Weight:   97.7 kg 96.3 kg  Height:       SpO2: 93 %   Intake/Output Summary (Last 24 hours) at 06/07/2021 0723 Last data filed at 06/07/2021 0343 Gross per 24 hour  Intake 477 ml  Output 1675 ml  Net -1198 ml   Filed Weights   06/06/21 2020 06/07/21 0418 06/07/21 0541  Weight: 97.7 kg 97.7 kg 96.3 kg    Exam: General exam: In no acute distress. Respiratory system: Good air movement and clear to auscultation. Cardiovascular system: S1 & S2 heard, RRR. No JVD. Gastrointestinal system: Abdomen is nondistended, soft and nontender.  Extremities: No pedal edema. Skin: No rashes, lesions or ulcers Psychiatry: Judgement and insight appear normal. Mood & affect appropriate.    Data Reviewed:    Labs: Basic Metabolic Panel: Recent Labs  Lab 06/06/21 1500 06/07/21 0106  NA 136 138  K 3.7 3.5  CL 99 99  CO2 28 28  GLUCOSE 145* 152*  BUN 20 20  CREATININE 1.36* 1.55*  CALCIUM 9.2 8.8*   GFR Estimated Creatinine Clearance: 44.2 mL/min (A) (by C-G formula based on SCr of 1.55 mg/dL (H)). Liver Function Tests: Recent Labs  Lab 06/06/21 1500  AST 23  ALT 20  ALKPHOS 75  BILITOT 0.6  PROT 7.0  ALBUMIN 3.7   No results for input(s): LIPASE, AMYLASE in the last 168 hours. No results for input(s): AMMONIA in the last 168 hours. Coagulation profile No results for input(s): INR, PROTIME in the last 168 hours. COVID-19 Labs  No results for input(s): DDIMER, FERRITIN, LDH, CRP in the last 72 hours.  Lab Results  Component Value Date    SARSCOV2NAA NEGATIVE 06/06/2021   SARSCOV2NAA NEGATIVE 03/10/2021   SARSCOV2NAA NEGATIVE 01/01/2021   SARSCOV2NAA NEGATIVE 08/24/2020    CBC: Recent Labs  Lab 06/06/21 1500  WBC 8.2  NEUTROABS 6.0  HGB 16.0  HCT 48.4  MCV 87.1  PLT 229   Cardiac Enzymes: No results for input(s): CKTOTAL, CKMB, CKMBINDEX, TROPONINI in the last 168 hours. BNP (last 3 results) No results for input(s): PROBNP in the last 8760 hours. CBG: No results for input(s): GLUCAP in the last 168 hours. D-Dimer: No results for input(s): DDIMER in the last 72 hours. Hgb A1c: No results for input(s): HGBA1C in the last 72 hours. Lipid Profile: No results for input(s): CHOL, HDL, LDLCALC, TRIG, CHOLHDL, LDLDIRECT in the last 72  hours. Thyroid function studies: No results for input(s): TSH, T4TOTAL, T3FREE, THYROIDAB in the last 72 hours.  Invalid input(s): FREET3 Anemia work up: No results for input(s): VITAMINB12, FOLATE, FERRITIN, TIBC, IRON, RETICCTPCT in the last 72 hours. Sepsis Labs: Recent Labs  Lab 06/06/21 1500  WBC 8.2   Microbiology Recent Results (from the past 240 hour(s))  Resp Panel by RT-PCR (Flu A&B, Covid) Nasopharyngeal Swab     Status: None   Collection Time: 06/06/21  2:56 PM   Specimen: Nasopharyngeal Swab; Nasopharyngeal(NP) swabs in vial transport medium  Result Value Ref Range Status   SARS Coronavirus 2 by RT PCR NEGATIVE NEGATIVE Final    Comment: (NOTE) SARS-CoV-2 target nucleic acids are NOT DETECTED.  The SARS-CoV-2 RNA is generally detectable in upper respiratory specimens during the acute phase of infection. The lowest concentration of SARS-CoV-2 viral copies this assay can detect is 138 copies/mL. A negative result does not preclude SARS-Cov-2 infection and should not be used as the sole basis for treatment or other patient management decisions. A negative result may occur with  improper specimen collection/handling, submission of specimen other than  nasopharyngeal swab, presence of viral mutation(s) within the areas targeted by this assay, and inadequate number of viral copies(<138 copies/mL). A negative result must be combined with clinical observations, patient history, and epidemiological information. The expected result is Negative.  Fact Sheet for Patients:  BloggerCourse.com  Fact Sheet for Healthcare Providers:  SeriousBroker.it  This test is no t yet approved or cleared by the [place] FDA and  has been authorized for detection and/or diagnosis of SARS-CoV-2 by FDA under an Emergency Use Authorization (EUA). This EUA will remain  in effect (meaning this test can be used) for the duration of the COVID-19 declaration under Section 564(b)(1) of the Act, 21 U.S.C.section 360bbb-3(b)(1), unless the authorization is terminated  or revoked sooner.       Influenza A by PCR NEGATIVE NEGATIVE Final   Influenza B by PCR NEGATIVE NEGATIVE Final    Comment: (NOTE) The Xpert Xpress SARS-CoV-2/FLU/RSV plus assay is intended as an aid in the diagnosis of influenza from Nasopharyngeal swab specimens and should not be used as a sole basis for treatment. Nasal washings and aspirates are unacceptable for Xpert Xpress SARS-CoV-2/FLU/RSV testing.  Fact Sheet for Patients: BloggerCourse.com  Fact Sheet for Healthcare Providers: SeriousBroker.it  This test is not yet approved or cleared by the [place] FDA and has been authorized for detection and/or diagnosis of SARS-CoV-2 by FDA under an Emergency Use Authorization (EUA). This EUA will remain in effect (meaning this test can be used) for the duration of the COVID-19 declaration under Section 564(b)(1) of the Act, 21 U.S.C. section 360bbb-3(b)(1), unless the authorization is terminated or revoked.  Performed at [facility], [address], [place], [place] [address]       Medications:    amLODipine  5 mg Oral Daily   apixaban  5 mg Oral BID   atorvastatin  40 mg Oral QHS   docusate sodium  100 mg Oral BID   escitalopram  20 mg Oral Daily   furosemide  40 mg Intravenous Q12H   influenza vaccine adjuvanted  0.5 mL Intramuscular Tomorrow-1000   insulin aspart  0-15 Units Subcutaneous TID AC & HS   isosorbide mononitrate  60 mg Oral Daily   metoprolol tartrate  25 mg Oral BID   sacubitril-valsartan  1 tablet Oral BID   Continuous Infusions:    LOS: 0 days  Marinda Elk  [facility]  06/07/2021, 7:23 AM

## 2021-06-07 NOTE — Progress Notes (Signed)
Heart Failure Nurse Navigator Progress Note  PCP: Albert Ishihara, MD PCP-Cardiologist: Albert Powell., MD Admission Diagnosis: A/C CHF Admitted from: home with spouse  Presentation:   Albert Powell presented 9/22 with increased SOB. Pt interactive with interview process. Pt states he is taking all medication as prescribed, has not missed doses. States medication are affordable as he is on a grant for [employer] until the end of the year. States he follows a low sodium diet and watches his fluid restrictions. Lives on a second floor apartment with spouse, the parking lot is level with their entry (no stairs). States he plans to increase his exercise/walking daily and he is "just getting old" when asked if he could think of a specific reason for the fluid build up and increase BP. Pt has quick hospital f/u appt with Dr. Allyson Powell 9/30 @ 3:15, no HV TOC appt needed.   ECHO/ LVEF: 60-65%  Clinical Course:  Past Medical History:  Diagnosis Date   Atrial fibrillation ([name]) 03/30/2020   Bradycardia, sinus 02/18/2013   CAD (coronary artery disease)    stent to RCA 2003 also had 40% lesion at that time; NUCLEAR STRESS TEST, 01/16/2010 - normal  now with cath 80-90% stenosis in LAD, will try medical therapy if no improvement PCI   Carotid artery disease ([name])    Cataract    CHF (congestive heart failure) ([name])    Claudication ([name])    LEA DUPLEX, 07/07/2008 - Normal   COPD (chronic obstructive pulmonary disease) ([name])    DDD (degenerative disc disease) 2008   Diabetes mellitus    GERD (gastroesophageal reflux disease)    H/O hiatal hernia    History of colonoscopy 2004   finding of tics and AMV only no polpys   Hyperlipidemia 02/05/2013   Hypertension    Onychomycosis    Rosacea    Tobacco abuse 02/05/2013     Social History   Socioeconomic History   Marital status: Married    Spouse name: Albert Powell   Number of children: 1   Years of education: Not on file   Highest  education level: High school graduate  Occupational History   Occupation: retired  Tobacco Use   Smoking status: Former    Packs/day: 1.00    Years: 57.00    Pack years: 57.00    Types: Cigarettes    Quit date: 04/06/2013    Years since quitting: 8.1   Smokeless tobacco: Never   Tobacco comments:    pt states that he is using the vapor cigs  Vaping Use   Vaping Use: Every day   Start date: 09/15/2016   Substances: Nicotine  Substance and Sexual Activity   Alcohol use: Yes    Alcohol/week: 1.0 standard drink    Types: 1 Glasses of wine per week    Comment: "very little"   Drug use: No   Sexual activity: Not on file  Other Topics Concern   Not on file  Social History Narrative   Not on file   Social Determinants of Health   Financial Resource Strain: Medium Risk   Difficulty of Paying Living Expenses: Somewhat hard  Food Insecurity: No Food Insecurity   Worried About Running Out of Food in the Last Year: Never true   Ran Out of Food in the Last Year: Never true  Transportation Needs: No Transportation Needs   Lack of Transportation (Medical): No   Lack of Transportation (Non-Medical): No  Physical Activity: Not on file  Stress: Not  on file  Social Connections: Not on file    High Risk Criteria for Readmission and/or Poor Patient Outcomes: Heart failure hospital admissions (last 6 months): 2  No Show rate: 3% Difficult social situation: no Demonstrates medication adherence: yes Primary Language: English Literacy level: able to read/write and comprehend.   Education Assessment and Provision:  Detailed education and instructions provided on heart failure disease management including the following:  Signs and symptoms of Heart Failure When to call the physician Importance of daily weights Low sodium diet Fluid restriction Medication management Anticipated future follow-up appointments  Patient education given on each of the above topics.  Patient acknowledges  understanding via teach back method and acceptance of all instructions.  Education Materials:  "Living Better With Heart Failure" Booklet, HF zone tool, & Daily Weight Tracker Tool.  Patient has scale at home: yes Patient has pill box at home: yes   Barriers of Care:   -none  Considerations/Referrals:   Referral made to Heart Failure Pharmacist Stewardship: no Referral made to Heart Failure CSW/NCM TOC: no Referral made to Heart & Vascular TOC clinic: no, f/u 9/30 w Dr. Cherene Altes, MSN, RN Heart Failure Nurse Navigator [phone]

## 2021-06-07 NOTE — Progress Notes (Signed)
  Echocardiogram 2D Echocardiogram has been performed.  Albert Powell 06/07/2021, 12:10 PM

## 2021-06-08 ENCOUNTER — Inpatient Hospital Stay (HOSPITAL_COMMUNITY): Payer: Medicare Other

## 2021-06-08 DIAGNOSIS — I509 Heart failure, unspecified: Secondary | ICD-10-CM | POA: Diagnosis not present

## 2021-06-08 DIAGNOSIS — I5031 Acute diastolic (congestive) heart failure: Secondary | ICD-10-CM | POA: Diagnosis not present

## 2021-06-08 DIAGNOSIS — I739 Peripheral vascular disease, unspecified: Secondary | ICD-10-CM | POA: Diagnosis not present

## 2021-06-08 DIAGNOSIS — I161 Hypertensive emergency: Secondary | ICD-10-CM | POA: Diagnosis not present

## 2021-06-08 LAB — GLUCOSE, CAPILLARY
Glucose-Capillary: 128 mg/dL — ABNORMAL HIGH (ref 70–99)
Glucose-Capillary: 215 mg/dL — ABNORMAL HIGH (ref 70–99)
Glucose-Capillary: 102 mg/dL — ABNORMAL HIGH (ref 70–99)
Glucose-Capillary: 142 mg/dL — ABNORMAL HIGH (ref 70–99)

## 2021-06-08 LAB — BASIC METABOLIC PANEL
Anion gap: 9 (ref 5–15)
BUN: 35 mg/dL — ABNORMAL HIGH (ref 8–23)
CO2: 29 mmol/L (ref 22–32)
Calcium: 9 mg/dL (ref 8.9–10.3)
Chloride: 99 mmol/L (ref 98–111)
Creatinine, Ser: 2.47 mg/dL — ABNORMAL HIGH (ref 0.61–1.24)
GFR, Estimated: 26 mL/min — ABNORMAL LOW (ref >60)
Glucose, Bld: 109 mg/dL — ABNORMAL HIGH (ref 70–99)
Potassium: 3.6 mmol/L (ref 3.5–5.1)
Sodium: 137 mmol/L (ref 135–145)

## 2021-06-08 MED ORDER — SODIUM CHLORIDE 0.9 % IV BOLUS: 250.0000 mL | Freq: Once | INTRAVENOUS | Status: DC

## 2021-06-08 MED ORDER — SODIUM CHLORIDE 0.9 % IV BOLUS
500.0000 mL | Freq: Once | INTRAVENOUS | Status: AC
  Administered 2021-06-08: 500 mL via INTRAVENOUS

## 2021-06-08 NOTE — TOC Initial Note (Signed)
Transition of Care [facility]) - Initial/Assessment Note    Patient Details  Name: Albert Powell MRN: [id number] Date of Birth: 03/21/1941  Transition of [facility] CM/SW Contact:    Kermit Balo, RN Phone Number: 06/08/2021, 10:35 AM  Clinical Narrative:                 Patient is home with his spouse that is able to provide needed supervision.  Pt denies issues with home medications or transportation. Pt prefers to have [facility] services over outpatient therapy. He asked to use [facility]. MD please place orders prior to d/c. Information for [facility] services are on the AVS. TOC following for further d/c needs.   Expected Discharge Plan: Home w Home Health Services Barriers to Discharge: Continued Medical Work up   Patient Goals and CMS Choice   CMS Medicare.gov Compare Post Acute Care list provided to:: Patient Choice offered to / list presented to : Patient  Expected Discharge Plan and Services Expected Discharge Plan: Home w Home Health Services   Discharge Planning Services: CM Consult Post Acute Care Choice: Home Health Living arrangements for the past 2 months: Apartment                           HH Arranged: PT HH Agency: [facility] Date HH Agency Contacted: 06/08/21   Representative spoke with at [facility] Agency: Barbara Cower  Prior Living Arrangements/Services Living arrangements for the past 2 months: Apartment Lives with:: Spouse Patient language and need for interpreter reviewed:: Yes Do you feel safe going back to the place where you live?: Yes        Care giver support system in place?: Yes (comment) Current home services: DME (walker/ cane/ rollator/ shower seat) Criminal Activity/Legal Involvement Pertinent to Current Situation/Hospitalization: No - Comment as needed  Activities of Daily Living Home Assistive Devices/Equipment: Dentures (specify type), Walker (specify type), CBG Meter ADL Screening (condition at time of  admission) Patient's cognitive ability adequate to safely complete daily activities?: Yes Is the patient deaf or have difficulty hearing?: No Does the patient have difficulty seeing, even when wearing glasses/contacts?: No Does the patient have difficulty concentrating, remembering, or making decisions?: No Patient able to express need for assistance with ADLs?: Yes Does the patient have difficulty dressing or bathing?: No Independently performs ADLs?: Yes (appropriate for developmental age) Does the patient have difficulty walking or climbing stairs?: Yes Weakness of Legs: Both Weakness of Arms/Hands: None  Permission Sought/Granted                  Emotional Assessment Appearance:: Appears stated age Attitude/Demeanor/Rapport: Engaged Affect (typically observed): Accepting Orientation: : Oriented to Self, Oriented to Place, Oriented to  Time, Oriented to Situation   Psych Involvement: No (comment)  Admission diagnosis:  Acute exacerbation of CHF (congestive heart failure) ([name]) [I50.9] Acute on chronic congestive heart failure, unspecified heart failure type ([name]) [I50.9] Acute diastolic CHF (congestive heart failure) ([name]) [I50.31] Patient Active Problem List   Diagnosis Date Noted   Acute diastolic CHF (congestive heart failure) ([name]) 06/07/2021   Acute metabolic encephalopathy    Acute exacerbation of CHF (congestive heart failure) ([name]) 06/06/2021   CHF (congestive heart failure) ([name]) 03/10/2021   Acute hypoxemic respiratory failure ([name]) 03/10/2021   Uncontrolled hypertension 03/10/2021   Hypertensive emergency 01/01/2021   ACS (acute coronary syndrome) ([name])    AKI (acute kidney injury) ([name])    Atrial fibrillation with  RVR ([name]) 03/30/2020   Contusion of right hand 11/12/2018   Primary osteoarthritis of both first carpometacarpal joints 11/12/2018   Pain in joint of right shoulder 10/05/2018   Pain in right hand 10/05/2018   Hematoma 03/30/2018   Degeneration  of lumbar intervertebral disc 12/21/2017   Lumbar radiculopathy 12/21/2017   Vertigo 04/09/2017   Hypogonadism in male 12/07/2014   Screening for prostate cancer 12/07/2014   Claudication of right lower extremity ([name]) 10/20/2013   Claudication in peripheral vascular disease- Rt leg 09/20/2013   Obesity (BMI 30-39.9)- negative sleep study in the past 09/20/2013   Occlusion and stenosis of carotid artery without mention of cerebral infarction 05/23/2013   Peripheral arterial disease ([name]) 05/18/2013   Carotid artery disease ([name]) 04/14/2013   Bradycardia, sinus 02/18/2013   Hyperlipidemia 02/05/2013   Tobacco abuse 02/05/2013   Chest pain, unstable angina, negative MI 02/04/2013   Type 2 diabetes mellitus ([name]) 02/04/2013   CAD hx of 2 stents to RCA in 2003,  80-90% stenosis of LAD/CFX June 2014- medical Rx 02/04/2013   Benign essential hypertension 07/10/2011   Incisional hernia 05/22/2011   PCP:  [name], MD Pharmacy:   [facility] - [place], [place] - [address] Portal to Reg[facility] [address] [place] [address] Phone: [phone] Fax: [phone]     Social Determinants of Health (SDOH) Interventions Food Insecurity Interventions: Intervention Not Indicated Financial Strain Interventions: Intervention Not Indicated Housing Interventions: Intervention Not Indicated Transportation Interventions: Intervention Not Indicated  Readmission Risk Interventions No flowsheet data found.

## 2021-06-08 NOTE — Plan of Care (Signed)
  Problem: Health Behavior/Discharge Planning: Goal: Ability to manage health-related needs will improve Outcome: Progressing   Problem: Clinical Measurements: Goal: Ability to maintain clinical measurements within normal limits will improve Outcome: Progressing   Problem: Clinical Measurements: Goal: Diagnostic test results will improve Outcome: Progressing   Problem: Clinical Measurements: Goal: Respiratory complications will improve Outcome: Progressing   Problem: Clinical Measurements: Goal: Cardiovascular complication will be avoided Outcome: Progressing   Problem: Safety: Goal: Ability to remain free from injury will improve Outcome: Progressing   Problem: Education: Goal: Ability to demonstrate management of disease process will improve Outcome: Progressing   Problem: Activity: Goal: Capacity to carry out activities will improve Outcome: Progressing   Problem: Cardiac: Goal: Ability to achieve and maintain adequate cardiopulmonary perfusion will improve Outcome: Progressing

## 2021-06-08 NOTE — Progress Notes (Addendum)
[facility] PROGRESS NOTE    Progress Note  Albert Powell  JOA:[id number] DOB: 08-03-1941 DOA: 06/06/2021 PCP: [name], MD     Brief Narrative:   Albert Powell is an 80 y.o. male past medical history of A. fib on Eliquis, essential hypertension diabetes mellitus type 2, chronic diastolic heart failure presents with worsening shortness of breath at rest more pronounced over the last 2 days, in the ED was found to have a systolic blood pressure of 221/105, subsequently decreased to 133/110 after receiving IV Lasix   Assessment/Plan:   Acute on chronic diastolic heart failure: Chest x-ray showed cardiomegaly BNP elevated at 1300. Continue Imdur and Entresto.  2D echo was done which showed no wall motion abnormality with an EF of 60%. Her creatinine is at baseline usually ranges 1.4-1.6. Creatinine has increased, appears euvolemic on physical exam, hold Lasix, give normal saline bolus  Hypertensive urgency: Pressure is improved currently on Entresto, Imdur and metoprolol.  Mild elevation in cardiac troponins: In the setting of heart failure exacerbation and hypertensive urgency, likely demand ischemia. Was started back on his Plavix continue metoprolol.  Diabetes mellitus type 2: With a last A1c of 8.4, restart procedure continue sliding scale insulin.  Depression: Continue Lexapro.  Acute kidney injury on chronic kidney disease stage IIIa: Baseline creatinine 1.4-1.6 mildly increased after IV Lasix.  Hold Lasix give normal saline bolus.  Paroxysmal atrial fibrillation: Rate controlled continue Eliquis and metoprolol.  History of COPD: No wheezing on physical exam.  Acute metabolic encephalopathy: Mentation is significantly improved this morning able to carry on a coherent and good conversation. MRI cannot be performed as wife cannot find stimulator remote control. We will check a CT scan of the head   DVT prophylaxis: lovenox Family  Communication:none Status is: Inpatient  The patient will require care spanning > 2 midnights and should be moved to inpatient because: Hemodynamically unstable  Dispo: The patient is from: Home              Anticipated d/c is to: Home              Patient currently is not medically stable to d/c.   Difficult to place patient No      Code Status:     Code Status Orders  (From admission, onward)           Start     Ordered   06/06/21 2009  Full code  Continuous        06/06/21 2009           Code Status History     Date Active Date Inactive Code Status Order ID Comments User Context   03/10/2021 0635 03/12/2021 1908 DNR [id number]  John Giovanni, MD ED   01/01/2021 1953 01/03/2021 1936 DNR [id number]  Charlsie Quest, MD ED   08/27/2020 1620 08/28/2020 0032 Full Code [id number]  [name], MD Inpatient   03/30/2020 1535 04/03/2020 1544 Full Code [id number]  Jonah Blue, MD ED   03/30/2018 1202 03/31/2018 1439 Full Code [id number]  Nada Libman, MD Inpatient   06/02/2013 1252 06/03/2013 1334 Full Code [id number]  Dara Lords, PA-C Inpatient   02/04/2013 2243 02/08/2013 1241 Full Code [id number]  Eduard Clos, MD Inpatient         IV Access:   Peripheral IV   Procedures and diagnostic studies:   DG Chest Portable 1 View  Result Date: 06/06/2021 CLINICAL DATA:  Shortness of breath. EXAM: PORTABLE  CHEST 1 VIEW COMPARISON:  Chest x-ray 03/10/2021. FINDINGS: The heart is enlarged, unchanged. There is no focal lung consolidation, pleural effusion or pneumothorax. Thoracic stimulator device is again noted. There are atherosclerotic calcifications of the aortic arch. No acute fractures are seen. IMPRESSION: No active disease. Stable cardiomegaly. Electronically Signed   By: Darliss Cheney M.D.   On: 06/06/2021 16:23   ECHOCARDIOGRAM COMPLETE  Result Date: 06/07/2021    ECHOCARDIOGRAM REPORT   Patient Name:   Albert Powell Date of Exam: 06/07/2021  Medical Rec #:  [id number]        Height:       69.0 in Accession #:    [id number]       Weight:       212.4 lb Date of Birth:  [birth year]        BSA:          2.120 m Patient Age:    80 years         BP:           146/77 mmHg Patient Gender: M                HR:           60 bpm. Exam Location:  Inpatient Procedure: 2D Echo, Color Doppler, Cardiac Doppler and 3D Echo Indications:    I50.40* Unspecified combined systolic (congestive) and diastolic                 (congestive) heart failure  History:        Patient has prior history of Echocardiogram examinations, most                 recent 03/10/2021. CHF, CAD, Abnormal ECG, Arrythmias:Bradycardia                 and Atrial Fibrillation; Risk Factors:Diabetes, Dyslipidemia,                 Current Smoker and Hypertension.  Sonographer:    Sheralyn Boatman RDCS Referring Phys: [id number] [facility]  Sonographer Comments: Technically difficult study due to poor echo windows and patient is morbidly obese. Image acquisition challenging due to patient body habitus. Patient had apnea while sleeping during exam. IMPRESSIONS  1. Left ventricular ejection fraction, by estimation, is 60 to 65%. The left ventricle has normal function. The left ventricle has no regional wall motion abnormalities. There is mild concentric left ventricular hypertrophy. Left ventricular diastolic parameters are indeterminate. Elevated left ventricular end-diastolic pressure.  2. Right ventricular systolic function is normal. The right ventricular size is normal. Tricuspid regurgitation signal is inadequate for assessing PA pressure.  3. A small pericardial effusion is present. The pericardial effusion is posterior to the left ventricle.  4. The mitral valve is grossly normal. No evidence of mitral valve regurgitation. No evidence of mitral stenosis.  5. The aortic valve is grossly normal. Aortic valve regurgitation is not visualized. Mild aortic valve sclerosis is present, with no evidence of aortic  valve stenosis.  6. The inferior vena cava is normal in size with greater than 50% respiratory variability, suggesting right atrial pressure of 3 mmHg. Comparison(s): No significant change from prior study. FINDINGS  Left Ventricle: Left ventricular ejection fraction, by estimation, is 60 to 65%. The left ventricle has normal function. The left ventricle has no regional wall motion abnormalities. The left ventricular internal cavity size was normal in size. There is  mild concentric left ventricular hypertrophy. Left ventricular diastolic parameters  are indeterminate. Elevated left ventricular end-diastolic pressure. The E/e' is 22.3. Right Ventricle: The right ventricular size is normal. Right vetricular wall thickness was not well visualized. Right ventricular systolic function is normal. Tricuspid regurgitation signal is inadequate for assessing PA pressure. Left Atrium: Left atrial size was normal in size. Right Atrium: Right atrial size was normal in size. Pericardium: A small pericardial effusion is present. The pericardial effusion is posterior to the left ventricle. Mitral Valve: The mitral valve is grossly normal. No evidence of mitral valve regurgitation. No evidence of mitral valve stenosis. Tricuspid Valve: The tricuspid valve is grossly normal. Tricuspid valve regurgitation is not demonstrated. No evidence of tricuspid stenosis. Aortic Valve: The aortic valve is grossly normal. Aortic valve regurgitation is not visualized. Mild aortic valve sclerosis is present, with no evidence of aortic valve stenosis. Pulmonic Valve: The pulmonic valve was not well visualized. Pulmonic valve regurgitation is not visualized. Aorta: The aortic root and ascending aorta are structurally normal, with no evidence of dilitation. Venous: The inferior vena cava is normal in size with greater than 50% respiratory variability, suggesting right atrial pressure of 3 mmHg. IAS/Shunts: The interatrial septum was not well  visualized.  LEFT VENTRICLE PLAX 2D LVIDd:         5.00 cm     Diastology LVIDs:         3.30 cm     LV e' medial:    2.20 cm/s LV PW:         1.10 cm     LV E/e' medial:  26.3 LV IVS:        1.10 cm     LV e' lateral:   3.16 cm/s LVOT diam:     2.30 cm     LV E/e' lateral: 18.3 LV SV:         54 LV SV Index:   25 LVOT Area:     4.15 cm                             3D Volume EF: LV Volumes (MOD)           3D EF:        52 % LV vol d, MOD A2C: 65.6 ml LV EDV:       120 ml LV vol d, MOD A4C: 80.8 ml LV ESV:       58 ml LV vol s, MOD A2C: 35.7 ml LV SV:        62 ml LV vol s, MOD A4C: 35.5 ml LV SV MOD A2C:     30.0 ml LV SV MOD A4C:     80.8 ml LV SV MOD BP:      39.2 ml RIGHT VENTRICLE            IVC RV S prime:     7.35 cm/s  IVC diam: 1.60 cm TAPSE (M-mode): 1.2 cm LEFT ATRIUM             Index       RIGHT ATRIUM          Index LA diam:        3.50 cm 1.65 cm/m  RA Area:     9.03 cm LA Vol (A2C):   26.3 ml 12.41 ml/m RA Volume:   12.80 ml 6.04 ml/m LA Vol (A4C):   31.6 ml 14.91 ml/m LA Biplane Vol: 31.7 ml 14.96 ml/m  AORTIC VALVE LVOT  Vmax:   76.40 cm/s LVOT Vmean:  42.200 cm/s LVOT VTI:    0.130 m  AORTA Ao Root diam: 3.50 cm MITRAL VALVE MV Area (PHT): 2.62 cm    SHUNTS MV Decel Time: 289 msec    Systemic VTI:  0.13 m MV E velocity: 57.80 cm/s  Systemic Diam: 2.30 cm MV A velocity: 69.80 cm/s MV E/A ratio:  0.83 Jodelle Red MD Electronically signed by Jodelle Red MD Signature Date/Time: 06/07/2021/2:09:10 PM    Final      Medical Consultants:   None.   Subjective:    Albert Powell is more awake this morning with no new complaints able to carry on a conversation.  Objective:    Vitals:   06/08/21 0010 06/08/21 0437 06/08/21 0805 06/08/21 0911  BP: 109/61 111/63 (!) 167/68 (!) 150/69  Pulse: 64 60 (!) 56 (!) 58  Resp: 20 20 18    Temp: 98.3 F (36.8 C) 98.3 F (36.8 C) 98 F (36.7 C)   TempSrc: Oral Oral Oral   SpO2: 92% 93% 93%   Weight:  97.2 kg    Height:        SpO2: 93 %   Intake/Output Summary (Last 24 hours) at 06/08/2021 0943 Last data filed at 06/08/2021 0000 Gross per 24 hour  Intake 200 ml  Output --  Net 200 ml    Filed Weights   06/07/21 0418 06/07/21 0541 06/08/21 0437  Weight: 97.7 kg 96.3 kg 97.2 kg    Exam: General exam: In no acute distress. Respiratory system: Good air movement and clear to auscultation. Cardiovascular system: S1 & S2 heard, RRR. No JVD. Gastrointestinal system: Abdomen is nondistended, soft and nontender.  Extremities: No pedal edema. Skin: No rashes, lesions or ulcers Psychiatry: Judgement and insight appear normal. Mood & affect appropriate.   Data Reviewed:    Labs: Basic Metabolic Panel: Recent Labs  Lab 06/06/21 1500 06/07/21 0106 06/08/21 0233  NA 136 138 137  K 3.7 3.5 3.6  CL 99 99 99  CO2 28 28 29   GLUCOSE 145* 152* 109*  BUN 20 20 35*  CREATININE 1.36* 1.55* 2.47*  CALCIUM 9.2 8.8* 9.0    GFR Estimated Creatinine Clearance: 27.9 mL/min (A) (by C-G formula based on SCr of 2.47 mg/dL (H)). Liver Function Tests: Recent Labs  Lab 06/06/21 1500  AST 23  ALT 20  ALKPHOS 75  BILITOT 0.6  PROT 7.0  ALBUMIN 3.7    No results for input(s): LIPASE, AMYLASE in the last 168 hours. No results for input(s): AMMONIA in the last 168 hours. Coagulation profile No results for input(s): INR, PROTIME in the last 168 hours. COVID-19 Labs  No results for input(s): DDIMER, FERRITIN, LDH, CRP in the last 72 hours.  Lab Results  Component Value Date   SARSCOV2NAA NEGATIVE 06/06/2021   SARSCOV2NAA NEGATIVE 03/10/2021   SARSCOV2NAA NEGATIVE 01/01/2021   SARSCOV2NAA NEGATIVE 08/24/2020    CBC: Recent Labs  Lab 06/06/21 1500  WBC 8.2  NEUTROABS 6.0  HGB 16.0  HCT 48.4  MCV 87.1  PLT 229    Cardiac Enzymes: No results for input(s): CKTOTAL, CKMB, CKMBINDEX, TROPONINI in the last 168 hours. BNP (last 3 results) No results for input(s): PROBNP in the last 8760  hours. CBG: Recent Labs  Lab 06/07/21 0738 06/07/21 1128 06/07/21 1623 06/07/21 2103 06/08/21 0758  GLUCAP 186* 224* 117* 205* 128*   D-Dimer: No results for input(s): DDIMER in the last 72 hours. Hgb A1c: No results  for input(s): HGBA1C in the last 72 hours. Lipid Profile: No results for input(s): CHOL, HDL, LDLCALC, TRIG, CHOLHDL, LDLDIRECT in the last 72 hours. Thyroid function studies: No results for input(s): TSH, T4TOTAL, T3FREE, THYROIDAB in the last 72 hours.  Invalid input(s): FREET3 Anemia work up: No results for input(s): VITAMINB12, FOLATE, FERRITIN, TIBC, IRON, RETICCTPCT in the last 72 hours. Sepsis Labs: Recent Labs  Lab 06/06/21 1500  WBC 8.2    Microbiology Recent Results (from the past 240 hour(s))  Resp Panel by RT-PCR (Flu A&B, Covid) Nasopharyngeal Swab     Status: None   Collection Time: 06/06/21  2:56 PM   Specimen: Nasopharyngeal Swab; Nasopharyngeal(NP) swabs in vial transport medium  Result Value Ref Range Status   SARS Coronavirus 2 by RT PCR NEGATIVE NEGATIVE Final    Comment: (NOTE) SARS-CoV-2 target nucleic acids are NOT DETECTED.  The SARS-CoV-2 RNA is generally detectable in upper respiratory specimens during the acute phase of infection. The lowest concentration of SARS-CoV-2 viral copies this assay can detect is 138 copies/mL. A negative result does not preclude SARS-Cov-2 infection and should not be used as the sole basis for treatment or other patient management decisions. A negative result may occur with  improper specimen collection/handling, submission of specimen other than nasopharyngeal swab, presence of viral mutation(s) within the areas targeted by this assay, and inadequate number of viral copies(<138 copies/mL). A negative result must be combined with clinical observations, patient history, and epidemiological information. The expected result is Negative.  Fact Sheet for Patients:   BloggerCourse.com  Fact Sheet for Healthcare Providers:  SeriousBroker.it  This test is no t yet approved or cleared by the [place] FDA and  has been authorized for detection and/or diagnosis of SARS-CoV-2 by FDA under an Emergency Use Authorization (EUA). This EUA will remain  in effect (meaning this test can be used) for the duration of the COVID-19 declaration under Section 564(b)(1) of the Act, 21 U.S.C.section 360bbb-3(b)(1), unless the authorization is terminated  or revoked sooner.       Influenza A by PCR NEGATIVE NEGATIVE Final   Influenza B by PCR NEGATIVE NEGATIVE Final    Comment: (NOTE) The Xpert Xpress SARS-CoV-2/FLU/RSV plus assay is intended as an aid in the diagnosis of influenza from Nasopharyngeal swab specimens and should not be used as a sole basis for treatment. Nasal washings and aspirates are unacceptable for Xpert Xpress SARS-CoV-2/FLU/RSV testing.  Fact Sheet for Patients: BloggerCourse.com  Fact Sheet for Healthcare Providers: SeriousBroker.it  This test is not yet approved or cleared by the [place] FDA and has been authorized for detection and/or diagnosis of SARS-CoV-2 by FDA under an Emergency Use Authorization (EUA). This EUA will remain in effect (meaning this test can be used) for the duration of the COVID-19 declaration under Section 564(b)(1) of the Act, 21 U.S.C. section 360bbb-3(b)(1), unless the authorization is terminated or revoked.  Performed at [facility], [address] [place], [place] [address]      Medications:    amLODipine  5 mg Oral Daily   apixaban  5 mg Oral BID   atorvastatin  40 mg Oral QHS   clopidogrel  75 mg Oral Daily   dapagliflozin propanediol  10 mg Oral QAC breakfast   docusate sodium  100 mg Oral BID   escitalopram  20 mg Oral Daily   furosemide  40 mg Intravenous Q12H   insulin  aspart  0-15 Units Subcutaneous TID AC & HS   isosorbide  mononitrate  60 mg Oral Daily   metoprolol tartrate  25 mg Oral BID   sacubitril-valsartan  1 tablet Oral BID   Continuous Infusions:    LOS: 1 day   Marinda Elk  [facility]  06/08/2021, 9:43 AM

## 2021-06-09 ENCOUNTER — Inpatient Hospital Stay (HOSPITAL_COMMUNITY): Payer: Medicare Other

## 2021-06-09 DIAGNOSIS — I509 Heart failure, unspecified: Secondary | ICD-10-CM | POA: Diagnosis not present

## 2021-06-09 DIAGNOSIS — I5031 Acute diastolic (congestive) heart failure: Secondary | ICD-10-CM | POA: Diagnosis not present

## 2021-06-09 DIAGNOSIS — I161 Hypertensive emergency: Secondary | ICD-10-CM | POA: Diagnosis not present

## 2021-06-09 DIAGNOSIS — I739 Peripheral vascular disease, unspecified: Secondary | ICD-10-CM | POA: Diagnosis not present

## 2021-06-09 LAB — BASIC METABOLIC PANEL
Anion gap: 11 (ref 5–15)
BUN: 44 mg/dL — ABNORMAL HIGH (ref 8–23)
CO2: 26 mmol/L (ref 22–32)
Calcium: 8.6 mg/dL — ABNORMAL LOW (ref 8.9–10.3)
Chloride: 96 mmol/L — ABNORMAL LOW (ref 98–111)
Creatinine, Ser: 2.12 mg/dL — ABNORMAL HIGH (ref 0.61–1.24)
GFR, Estimated: 31 mL/min — ABNORMAL LOW (ref >60)
Glucose, Bld: 104 mg/dL — ABNORMAL HIGH (ref 70–99)
Potassium: 3.5 mmol/L (ref 3.5–5.1)
Sodium: 133 mmol/L — ABNORMAL LOW (ref 135–145)

## 2021-06-09 LAB — GLUCOSE, CAPILLARY
Glucose-Capillary: 116 mg/dL — ABNORMAL HIGH (ref 70–99)
Glucose-Capillary: 222 mg/dL — ABNORMAL HIGH (ref 70–99)
Glucose-Capillary: 91 mg/dL (ref 70–99)
Glucose-Capillary: 147 mg/dL — ABNORMAL HIGH (ref 70–99)

## 2021-06-09 LAB — HEMOGLOBIN A1C
Hgb A1c MFr Bld: 7.4 % — ABNORMAL HIGH (ref 4.8–5.6)
Mean Plasma Glucose: 165.68 mg/dL

## 2021-06-09 MED ORDER — SODIUM CHLORIDE 0.9 % IV SOLN: INTRAVENOUS | Status: DC

## 2021-06-09 MED ORDER — INSULIN ASPART 100 UNIT/ML IJ SOLN
3.0000 [IU] | Freq: Three times a day (TID) | INTRAMUSCULAR | Status: DC
  Administered 2021-06-09 – 2021-06-10 (×3): 3 [IU] via SUBCUTANEOUS

## 2021-06-09 MED ORDER — SODIUM CHLORIDE 0.9 % IV SOLN: INTRAVENOUS | Status: AC

## 2021-06-09 MED ORDER — INSULIN ASPART 100 UNIT/ML IJ SOLN: 0.0000 [IU] | Freq: Every day | INTRAMUSCULAR | Status: DC

## 2021-06-09 MED ORDER — INSULIN ASPART 100 UNIT/ML IJ SOLN
0.0000 [IU] | Freq: Three times a day (TID) | INTRAMUSCULAR | Status: DC
  Administered 2021-06-09: 3 [IU] via SUBCUTANEOUS
  Administered 2021-06-10: 1 [IU] via SUBCUTANEOUS

## 2021-06-09 NOTE — Plan of Care (Signed)
  Problem: Health Behavior/Discharge Planning: Goal: Ability to manage health-related needs will improve Outcome: Progressing   Problem: Clinical Measurements: Goal: Ability to maintain clinical measurements within normal limits will improve Outcome: Progressing   Problem: Clinical Measurements: Goal: Diagnostic test results will improve Outcome: Progressing   Problem: Clinical Measurements: Goal: Respiratory complications will improve Outcome: Progressing   Problem: Clinical Measurements: Goal: Cardiovascular complication will be avoided Outcome: Progressing   Problem: Activity: Goal: Risk for activity intolerance will decrease Outcome: Progressing   Problem: Safety: Goal: Ability to remain free from injury will improve Outcome: Progressing   Problem: Skin Integrity: Goal: Risk for impaired skin integrity will decrease Outcome: Progressing   Problem: Cardiac: Goal: Ability to achieve and maintain adequate cardiopulmonary perfusion will improve Outcome: Progressing

## 2021-06-09 NOTE — Progress Notes (Signed)
[facility] PROGRESS NOTE    Progress Note  Albert Powell  NWG:[id number] DOB: 1940/12/11 DOA: 06/06/2021 PCP: [name], MD     Brief Narrative:   Albert Powell is an 80 y.o. male past medical history of A. fib on Eliquis, essential hypertension diabetes mellitus type 2, chronic diastolic heart failure presents with worsening shortness of breath at rest more pronounced over the last 2 days, in the ED was found to have a systolic blood pressure of 221/105, subsequently decreased to 133/110 after receiving IV Lasix   Assessment/Plan:   Acute encephalopathy/Hypertensive urgency: His mentation has significantly improved. Blood pressure is improved but significantly elevated continue Entresto Imdur and metoprolol.   CT scan of the head showed no acute findings, but today showed chronic left temporal lobe encephalomalacia. Check an EEG.  Chronic diastolic heart failure: Chest x-ray showed cardiomegaly BNP elevated at 1300. Acute exacerbation has been ruled out. Continue metoprolol, Imdur and Entresto.  Mild elevation in cardiac troponins: In the setting of hypertensive urgency, likely demand ischemia. Was started back on his Plavix continue metoprolol.  Denies any chest pain  Diabetes mellitus type 2: With a last A1c of 8.4, blood glucose continues to be elevated change sliding scale insulin with meal coverage.  Depression: Continue Lexapro.  Acute kidney injury on chronic kidney disease stage IIIa: Baseline creatinine 1.4-1.6, Lasix was held will start on IV fluids there is been a mild increase in his creatinine continue normal saline for 12 hours.  Paroxysmal atrial fibrillation: Rate controlled continue Eliquis and metoprolol.  History of COPD: No wheezing on physical exam.   DVT prophylaxis: lovenox Family Communication:none Status is: Inpatient  The patient will require care spanning > 2 midnights and should be moved to inpatient because:  Hemodynamically unstable  Dispo: The patient is from: Home              Anticipated d/c is to: Home              Patient currently is not medically stable to d/c.   Difficult to place patient No      Code Status:     Code Status Orders  (From admission, onward)           Start     Ordered   06/06/21 2009  Full code  Continuous        06/06/21 2009           Code Status History     Date Active Date Inactive Code Status Order ID Comments User Context   03/10/2021 0635 03/12/2021 1908 DNR [id number]  John Giovanni, MD ED   01/01/2021 1953 01/03/2021 1936 DNR [id number]  Charlsie Quest, MD ED   08/27/2020 1620 08/28/2020 0032 Full Code [id number]  [name], MD Inpatient   03/30/2020 1535 04/03/2020 1544 Full Code [id number]  Jonah Blue, MD ED   03/30/2018 1202 03/31/2018 1439 Full Code [id number]  Nada Libman, MD Inpatient   06/02/2013 1252 06/03/2013 1334 Full Code [id number]  Dara Lords, PA-C Inpatient   02/04/2013 2243 02/08/2013 1241 Full Code [id number]  Eduard Clos, MD Inpatient         IV Access:   Peripheral IV   Procedures and diagnostic studies:   CT HEAD WO CONTRAST ( )  Result Date: 06/08/2021 CLINICAL DATA:  80 year old male with atrial fibrillation on Eliquis. Increasing shortness of breath. Hypertensive, 221 systolic. Delirium. EXAM: CT HEAD WITHOUT CONTRAST TECHNIQUE: Contiguous axial images were obtained from  the base of the skull through the vertex without intravenous contrast. COMPARISON:  Head CT 01/01/2021, 03/27/2017. FINDINGS: Brain: Stable cerebral volume. Confluent bilateral white matter hypodensity appears stable. No midline shift, ventriculomegaly, mass effect, evidence of mass lesion, intracranial hemorrhage or evidence of cortically based acute infarction. Questionable small area of cortical encephalomalacia in the anterior mesial temporal lobe on series 5, image 34 is stable since 2018. Vascular: Calcified  atherosclerosis at the skull base. No suspicious intracranial vascular hyperdensity. Skull: Negative. Sinuses/Orbits: Visualized paranasal sinuses and mastoids are clear. Other: No acute orbit or scalp soft tissue finding. IMPRESSION: 1. No acute intracranial abnormality. 2. Stable non contrast CT appearance of the brain with advanced white matter disease and questionable chronic left temporal lobe encephalomalacia. Electronically Signed   By: Odessa Fleming M.D.   On: 06/08/2021 10:58   ECHOCARDIOGRAM COMPLETE  Result Date: 06/07/2021    ECHOCARDIOGRAM REPORT   Patient Name:   Albert Powell Date of Exam: 06/07/2021 Medical Rec #:  [id number]        Height:       69.0 in Accession #:    [id number]       Weight:       212.4 lb Date of Birth:  06/18/1941        BSA:          2.120 m Patient Age:    80 years         BP:           146/77 mmHg Patient Gender: M                HR:           60 bpm. Exam Location:  Inpatient Procedure: 2D Echo, Color Doppler, Cardiac Doppler and 3D Echo Indications:    I50.40* Unspecified combined systolic (congestive) and diastolic                 (congestive) heart failure  History:        Patient has prior history of Echocardiogram examinations, most                 recent 03/10/2021. CHF, CAD, Abnormal ECG, Arrythmias:Bradycardia                 and Atrial Fibrillation; Risk Factors:Diabetes, Dyslipidemia,                 Current Smoker and Hypertension.  Sonographer:    Sheralyn Boatman RDCS Referring Phys: [id number] [facility] A [facility]  Sonographer Comments: Technically difficult study due to poor echo windows and patient is morbidly obese. Image acquisition challenging due to patient body habitus. Patient had apnea while sleeping during exam. IMPRESSIONS  1. Left ventricular ejection fraction, by estimation, is 60 to 65%. The left ventricle has normal function. The left ventricle has no regional wall motion abnormalities. There is mild concentric left ventricular hypertrophy. Left ventricular  diastolic parameters are indeterminate. Elevated left ventricular end-diastolic pressure.  2. Right ventricular systolic function is normal. The right ventricular size is normal. Tricuspid regurgitation signal is inadequate for assessing PA pressure.  3. A small pericardial effusion is present. The pericardial effusion is posterior to the left ventricle.  4. The mitral valve is grossly normal. No evidence of mitral valve regurgitation. No evidence of mitral stenosis.  5. The aortic valve is grossly normal. Aortic valve regurgitation is not visualized. Mild aortic valve sclerosis is present, with no evidence of aortic valve stenosis.  6. The  inferior vena cava is normal in size with greater than 50% respiratory variability, suggesting right atrial pressure of 3 mmHg. Comparison(s): No significant change from prior study. FINDINGS  Left Ventricle: Left ventricular ejection fraction, by estimation, is 60 to 65%. The left ventricle has normal function. The left ventricle has no regional wall motion abnormalities. The left ventricular internal cavity size was normal in size. There is  mild concentric left ventricular hypertrophy. Left ventricular diastolic parameters are indeterminate. Elevated left ventricular end-diastolic pressure. The E/e' is 22.3. Right Ventricle: The right ventricular size is normal. Right vetricular wall thickness was not well visualized. Right ventricular systolic function is normal. Tricuspid regurgitation signal is inadequate for assessing PA pressure. Left Atrium: Left atrial size was normal in size. Right Atrium: Right atrial size was normal in size. Pericardium: A small pericardial effusion is present. The pericardial effusion is posterior to the left ventricle. Mitral Valve: The mitral valve is grossly normal. No evidence of mitral valve regurgitation. No evidence of mitral valve stenosis. Tricuspid Valve: The tricuspid valve is grossly normal. Tricuspid valve regurgitation is not  demonstrated. No evidence of tricuspid stenosis. Aortic Valve: The aortic valve is grossly normal. Aortic valve regurgitation is not visualized. Mild aortic valve sclerosis is present, with no evidence of aortic valve stenosis. Pulmonic Valve: The pulmonic valve was not well visualized. Pulmonic valve regurgitation is not visualized. Aorta: The aortic root and ascending aorta are structurally normal, with no evidence of dilitation. Venous: The inferior vena cava is normal in size with greater than 50% respiratory variability, suggesting right atrial pressure of 3 mmHg. IAS/Shunts: The interatrial septum was not well visualized.  LEFT VENTRICLE PLAX 2D LVIDd:         5.00 cm     Diastology LVIDs:         3.30 cm     LV e' medial:    2.20 cm/s LV PW:         1.10 cm     LV E/e' medial:  26.3 LV IVS:        1.10 cm     LV e' lateral:   3.16 cm/s LVOT diam:     2.30 cm     LV E/e' lateral: 18.3 LV SV:         54 LV SV Index:   25 LVOT Area:     4.15 cm                             3D Volume EF: LV Volumes (MOD)           3D EF:        52 % LV vol d, MOD A2C: 65.6 ml LV EDV:       120 ml LV vol d, MOD A4C: 80.8 ml LV ESV:       58 ml LV vol s, MOD A2C: 35.7 ml LV SV:        62 ml LV vol s, MOD A4C: 35.5 ml LV SV MOD A2C:     30.0 ml LV SV MOD A4C:     80.8 ml LV SV MOD BP:      39.2 ml RIGHT VENTRICLE            IVC RV S prime:     7.35 cm/s  IVC diam: 1.60 cm TAPSE (M-mode): 1.2 cm LEFT ATRIUM  Index       RIGHT ATRIUM          Index LA diam:        3.50 cm 1.65 cm/m  RA Area:     9.03 cm LA Vol (A2C):   26.3 ml 12.41 ml/m RA Volume:   12.80 ml 6.04 ml/m LA Vol (A4C):   31.6 ml 14.91 ml/m LA Biplane Vol: 31.7 ml 14.96 ml/m  AORTIC VALVE LVOT Vmax:   76.40 cm/s LVOT Vmean:  42.200 cm/s LVOT VTI:    0.130 m  AORTA Ao Root diam: 3.50 cm MITRAL VALVE MV Area (PHT): 2.62 cm    SHUNTS MV Decel Time: 289 msec    Systemic VTI:  0.13 m MV E velocity: 57.80 cm/s  Systemic Diam: 2.30 cm MV A velocity: 69.80 cm/s  MV E/A ratio:  0.83 Jodelle Red MD Electronically signed by Jodelle Red MD Signature Date/Time: 06/07/2021/2:09:10 PM    Final      Medical Consultants:   None.   Subjective:    Albert Powell no complaints feels great.  Objective:    Vitals:   06/08/21 2128 06/09/21 0517 06/09/21 0748 06/09/21 0848  BP: 111/71 (!) 159/70 (!) 141/64 (!) 141/64  Pulse: 65 (!) 56 60 67  Resp:  19 18   Temp:  97.6 F (36.4 C) 97.7 F (36.5 C)   TempSrc:  Oral Oral   SpO2:  97% 94%   Weight:  97.9 kg    Height:       SpO2: 94 %   Intake/Output Summary (Last 24 hours) at 06/09/2021 0853 Last data filed at 06/09/2021 0351 Gross per 24 hour  Intake 490.3 ml  Output 510 ml  Net -19.7 ml    Filed Weights   06/07/21 0541 06/08/21 0437 06/09/21 0517  Weight: 96.3 kg 97.2 kg 97.9 kg    Exam: General exam: In no acute distress. Respiratory system: Good air movement and clear to auscultation. Cardiovascular system: S1 & S2 heard, RRR. No JVD. Gastrointestinal system: Abdomen is nondistended, soft and nontender.  Extremities: No pedal edema. Skin: No rashes, lesions or ulcers Psychiatry: Judgement and insight appear normal. Mood & affect appropriate.   Data Reviewed:    Labs: Basic Metabolic Panel: Recent Labs  Lab 06/06/21 1500 06/07/21 0106 06/08/21 0233 06/09/21 0156  NA 136 138 137 133*  K 3.7 3.5 3.6 3.5  CL 99 99 99 96*  CO2 28 28 29 26   GLUCOSE 145* 152* 109* 104*  BUN 20 20 35* 44*  CREATININE 1.36* 1.55* 2.47* 2.12*  CALCIUM 9.2 8.8* 9.0 8.6*    GFR Estimated Creatinine Clearance: 32.6 mL/min (A) (by C-G formula based on SCr of 2.12 mg/dL (H)). Liver Function Tests: Recent Labs  Lab 06/06/21 1500  AST 23  ALT 20  ALKPHOS 75  BILITOT 0.6  PROT 7.0  ALBUMIN 3.7    No results for input(s): LIPASE, AMYLASE in the last 168 hours. No results for input(s): AMMONIA in the last 168 hours. Coagulation profile No results for input(s): INR,  PROTIME in the last 168 hours. COVID-19 Labs  No results for input(s): DDIMER, FERRITIN, LDH, CRP in the last 72 hours.  Lab Results  Component Value Date   SARSCOV2NAA NEGATIVE 06/06/2021   SARSCOV2NAA NEGATIVE 03/10/2021   SARSCOV2NAA NEGATIVE 01/01/2021   SARSCOV2NAA NEGATIVE 08/24/2020    CBC: Recent Labs  Lab 06/06/21 1500  WBC 8.2  NEUTROABS 6.0  HGB 16.0  HCT 48.4  MCV  87.1  PLT 229    Cardiac Enzymes: No results for input(s): CKTOTAL, CKMB, CKMBINDEX, TROPONINI in the last 168 hours. BNP (last 3 results) No results for input(s): PROBNP in the last 8760 hours. CBG: Recent Labs  Lab 06/08/21 0758 06/08/21 1105 06/08/21 1639 06/08/21 2112 06/09/21 0746  GLUCAP 128* 215* 102* 142* 116*    D-Dimer: No results for input(s): DDIMER in the last 72 hours. Hgb A1c: No results for input(s): HGBA1C in the last 72 hours. Lipid Profile: No results for input(s): CHOL, HDL, LDLCALC, TRIG, CHOLHDL, LDLDIRECT in the last 72 hours. Thyroid function studies: No results for input(s): TSH, T4TOTAL, T3FREE, THYROIDAB in the last 72 hours.  Invalid input(s): FREET3 Anemia work up: No results for input(s): VITAMINB12, FOLATE, FERRITIN, TIBC, IRON, RETICCTPCT in the last 72 hours. Sepsis Labs: Recent Labs  Lab 06/06/21 1500  WBC 8.2    Microbiology Recent Results (from the past 240 hour(s))  Resp Panel by RT-PCR (Flu A&B, Covid) Nasopharyngeal Swab     Status: None   Collection Time: 06/06/21  2:56 PM   Specimen: Nasopharyngeal Swab; Nasopharyngeal(NP) swabs in vial transport medium  Result Value Ref Range Status   SARS Coronavirus 2 by RT PCR NEGATIVE NEGATIVE Final    Comment: (NOTE) SARS-CoV-2 target nucleic acids are NOT DETECTED.  The SARS-CoV-2 RNA is generally detectable in upper respiratory specimens during the acute phase of infection. The lowest concentration of SARS-CoV-2 viral copies this assay can detect is 138 copies/mL. A negative result does not  preclude SARS-Cov-2 infection and should not be used as the sole basis for treatment or other patient management decisions. A negative result may occur with  improper specimen collection/handling, submission of specimen other than nasopharyngeal swab, presence of viral mutation(s) within the areas targeted by this assay, and inadequate number of viral copies(<138 copies/mL). A negative result must be combined with clinical observations, patient history, and epidemiological information. The expected result is Negative.  Fact Sheet for Patients:  BloggerCourse.com  Fact Sheet for Healthcare Providers:  SeriousBroker.it  This test is no t yet approved or cleared by the [place] FDA and  has been authorized for detection and/or diagnosis of SARS-CoV-2 by FDA under an Emergency Use Authorization (EUA). This EUA will remain  in effect (meaning this test can be used) for the duration of the COVID-19 declaration under Section 564(b)(1) of the Act, 21 U.S.C.section 360bbb-3(b)(1), unless the authorization is terminated  or revoked sooner.       Influenza A by PCR NEGATIVE NEGATIVE Final   Influenza B by PCR NEGATIVE NEGATIVE Final    Comment: (NOTE) The Xpert Xpress SARS-CoV-2/FLU/RSV plus assay is intended as an aid in the diagnosis of influenza from Nasopharyngeal swab specimens and should not be used as a sole basis for treatment. Nasal washings and aspirates are unacceptable for Xpert Xpress SARS-CoV-2/FLU/RSV testing.  Fact Sheet for Patients: BloggerCourse.com  Fact Sheet for Healthcare Providers: SeriousBroker.it  This test is not yet approved or cleared by the [place] FDA and has been authorized for detection and/or diagnosis of SARS-CoV-2 by FDA under an Emergency Use Authorization (EUA). This EUA will remain in effect (meaning this test can be used) for the  duration of the COVID-19 declaration under Section 564(b)(1) of the Act, 21 U.S.C. section 360bbb-3(b)(1), unless the authorization is terminated or revoked.  Performed at [facility], [address] [place], [place] [address]      Medications:    amLODipine  5 mg  Oral Daily   apixaban  5 mg Oral BID   atorvastatin  40 mg Oral QHS   clopidogrel  75 mg Oral Daily   dapagliflozin propanediol  10 mg Oral QAC breakfast   docusate sodium  100 mg Oral BID   escitalopram  20 mg Oral Daily   insulin aspart  0-15 Units Subcutaneous TID AC & HS   metoprolol tartrate  25 mg Oral BID   Continuous Infusions:  sodium chloride        LOS: 2 days   Marinda Elk  [facility]  06/09/2021, 8:53 AM

## 2021-06-09 NOTE — Progress Notes (Signed)
EEG done at bedside. Results pending.  

## 2021-06-09 NOTE — Progress Notes (Signed)
Physical Therapy Treatment Patient Details Name: Albert Powell MRN: [id number] DOB: 10/26/1940 Today's Date: 06/09/2021   History of Present Illness 80yo male who presented on 9/22 with progressive SOB. Covid negative. Admitted with acute on chronic diastolic heart failure exacerbation, HTN urgency. PMH Afib, CAD, CHF, claudication, COPD, DM, HLD, HTN, cardiac stents, colon surgery    PT Comments    Patient progressing well towards PT goals. Reports feeling tired. Improved ambulation distance with Min guard assist and use of RW. Needs Min A when walking backwards out of bathroom. HR ranging from 45-65 bpm throughout session and SP02 >90% on RA. Eager to return home. Mild dyspnea noted but pt reports feeling close to baseline. Prefers HHPT to OPPT. Will follow.    Recommendations for follow up therapy are one component of a multi-disciplinary discharge planning process, led by the attending physician.  Recommendations may be updated based on patient status, additional functional criteria and insurance authorization.  Follow Up Recommendations  Home health PT;Supervision - Intermittent (pt prefers HH)     Equipment Recommendations  Rolling walker with 5" wheels    Recommendations for Other Services       Precautions / Restrictions Precautions Precautions: Fall;Other (comment) Precaution Comments: watch HR Restrictions Weight Bearing Restrictions: No     Mobility  Bed Mobility Overal bed mobility: Needs Assistance Bed Mobility: Supine to Sit;Sit to Supine     Supine to sit: Modified independent (Device/Increase time);HOB elevated Sit to supine: Modified independent (Device/Increase time);HOB elevated   General bed mobility comments: increased time and effort, no assist needed.    Transfers Overall transfer level: Needs assistance Equipment used: Rolling walker (2 wheeled) Transfers: Sit to/from Stand Sit to Stand: Min guard         General transfer comment: Use of  momentum to stand from EOB, increased effort.  Ambulation/Gait Ambulation/Gait assistance: Min guard Gait Distance (Feet): 250 Feet Assistive device: Rolling walker (2 wheeled) Gait Pattern/deviations: Step-through pattern;Trunk flexed;Wide base of support   Gait velocity interpretation: <1.31 ft/sec, indicative of household ambulator General Gait Details: mostly steady gait with RW for support; 1/4 DOE. Sp02 remained >90% on RA. HR dropped to 45 bpm once EOB but ranged up to 65 bpm with activity. Asymptomatic. Able to walk backwards out of bathroom wtih Min A   Stairs             Wheelchair Mobility    Modified Rankin (Stroke Patients Only)       Balance Overall balance assessment: Needs assistance Sitting-balance support: Feet supported;No upper extremity supported Sitting balance-Leahy Scale: Good     Standing balance support: During functional activity Standing balance-Leahy Scale: Fair Standing balance comment: Able to stand and urinate without assist.                            Cognition Arousal/Alertness: Awake/alert Behavior During Therapy: WFL for tasks assessed/performed Overall Cognitive Status: Within Functional Limits for tasks assessed                                        Exercises      General Comments General comments (skin integrity, edema, etc.): HR ranging from 45-65 bpm during session.      Pertinent Vitals/Pain Pain Assessment: No/denies pain    Home Living  Prior Function            PT Goals (current goals can now be found in the care plan section) Progress towards PT goals: Progressing toward goals    Frequency    Min 3X/week      PT Plan Current plan remains appropriate    Co-evaluation              AM-PAC PT "6 Clicks" Mobility   Outcome Measure  Help needed turning from your back to your side while in a flat bed without using bedrails?: A  Little Help needed moving from lying on your back to sitting on the side of a flat bed without using bedrails?: A Little Help needed moving to and from a bed to a chair (including a wheelchair)?: A Little Help needed standing up from a chair using your arms (e.g., wheelchair or bedside chair)?: A Little Help needed to walk in hospital room?: A Little Help needed climbing 3-5 steps with a railing? : A Little 6 Click Score: 18    End of Session Equipment Utilized During Treatment: Gait belt Activity Tolerance: Patient tolerated treatment well Patient left: in bed;with call bell/phone within reach (sitting EOB drinking coffee) Nurse Communication: Mobility status PT Visit Diagnosis: Muscle weakness (generalized) (M62.81);Difficulty in walking, not elsewhere classified (R26.2);Unsteadiness on feet (R26.81)     Time: 5993-5701 PT Time Calculation (min) (ACUTE ONLY): 19 min  Charges:  $Gait Training: 8-22 mins                     Vale Haven, PT, DPT Acute Rehabilitation Services Pager [phone] Office [phone]      Blake Divine A Lanier Ensign 06/09/2021, 4:16 PM

## 2021-06-09 NOTE — Procedures (Addendum)
This is a routine  inpatient EEG performed on: 06/09/2021   Clinical indication: AMS Clinical history:  The patient is a 80 year old man with a history of atrial fibrillation hypertension and diabetes who was admitted to the hospital with altered mental status.  Introduction: This is a routine inpatient EEG performed utilizing the standard 10-20 system of electrode placement with 21 channels of EEG and a single channel of EKG monitoring.  Approximately 27:32  minutes of EEG captured.  Photic stimulation but not hyperventilation was performed.  Description of the record: The awake background is continuous and symmetric characterized by a well formed 8-9  hertz posterior dominant rhythm.  Stage II sleep architecture contains by synchronous sleep spindles.  The single channel EKG monitor shows a heart rate in the 50   BPM range. No seizures, epileptiform discharges, or evolving ictal patterns seen  Impression: Normal awake and asleep routine EEG.  No seizures, epileptiform discharges, or evolving ictal patterns seen

## 2021-06-10 ENCOUNTER — Other Ambulatory Visit (HOSPITAL_COMMUNITY): Payer: Self-pay

## 2021-06-10 DIAGNOSIS — I5031 Acute diastolic (congestive) heart failure: Secondary | ICD-10-CM | POA: Diagnosis not present

## 2021-06-10 DIAGNOSIS — I161 Hypertensive emergency: Secondary | ICD-10-CM | POA: Diagnosis not present

## 2021-06-10 DIAGNOSIS — E1151 Type 2 diabetes mellitus with diabetic peripheral angiopathy without gangrene: Secondary | ICD-10-CM | POA: Diagnosis not present

## 2021-06-10 DIAGNOSIS — G9341 Metabolic encephalopathy: Secondary | ICD-10-CM | POA: Diagnosis not present

## 2021-06-10 LAB — BASIC METABOLIC PANEL
Anion gap: 8 (ref 5–15)
BUN: 36 mg/dL — ABNORMAL HIGH (ref 8–23)
CO2: 26 mmol/L (ref 22–32)
Calcium: 8.9 mg/dL (ref 8.9–10.3)
Chloride: 102 mmol/L (ref 98–111)
Creatinine, Ser: 1.87 mg/dL — ABNORMAL HIGH (ref 0.61–1.24)
GFR, Estimated: 36 mL/min — ABNORMAL LOW (ref >60)
Glucose, Bld: 139 mg/dL — ABNORMAL HIGH (ref 70–99)
Potassium: 3.6 mmol/L (ref 3.5–5.1)
Sodium: 136 mmol/L (ref 135–145)

## 2021-06-10 LAB — GLUCOSE, CAPILLARY: Glucose-Capillary: 141 mg/dL — ABNORMAL HIGH (ref 70–99)

## 2021-06-10 MED ORDER — FUROSEMIDE 20 MG PO TABS
20.0000 mg | ORAL_TABLET | Freq: Every day | ORAL | Status: DC
  Filled 2021-06-10: qty 1

## 2021-06-10 MED ORDER — ISOSORBIDE MONONITRATE ER 60 MG PO TB24
60.0000 mg | ORAL_TABLET | Freq: Every day | ORAL | Status: DC
  Administered 2021-06-10: 60 mg via ORAL
  Filled 2021-06-10: qty 1

## 2021-06-10 MED ORDER — FUROSEMIDE 20 MG PO TABS
20.0000 mg | ORAL_TABLET | Freq: Every day | ORAL | 0 refills | Status: DC
  Filled 2021-06-10: qty 30, 30d supply, fill #0

## 2021-06-10 MED ORDER — SACUBITRIL-VALSARTAN 49-51 MG PO TABS
1.0000 | ORAL_TABLET | Freq: Two times a day (BID) | ORAL | Status: DC
  Administered 2021-06-10: 1 via ORAL
  Filled 2021-06-10 (×2): qty 1

## 2021-06-10 MED ORDER — FUROSEMIDE 20 MG PO TABS
20.0000 mg | ORAL_TABLET | Freq: Every day | ORAL | 0 refills | Status: DC
Start: 2021-06-10 — End: 2021-06-10

## 2021-06-10 MED ORDER — ISOSORBIDE MONONITRATE ER 60 MG PO TB24: 60.0000 mg | ORAL_TABLET | Freq: Every day | ORAL | Status: DC

## 2021-06-10 MED ORDER — SPIRONOLACTONE 12.5 MG HALF TABLET
12.5000 mg | ORAL_TABLET | Freq: Every day | ORAL | Status: DC
  Administered 2021-06-10: 12.5 mg via ORAL
  Filled 2021-06-10: qty 1

## 2021-06-10 NOTE — Care Management Important Message (Signed)
Important Message  Patient Details  Name: Albert Powell MRN: [id number] Date of Birth: February 15, 1941   Medicare Important Message Given:  Yes     [name] 06/10/2021, 12:10 PM

## 2021-06-10 NOTE — Discharge Summary (Signed)
Physician Discharge Summary  Albert Powell AOZ:[id number] DOB: 09/25/1940 DOA: 06/06/2021  PCP: [name], MD  Admit date: 06/06/2021 Discharge date: 06/10/2021  Admitted From: home Disposition:  Home  Recommendations for Outpatient Follow-up:  Follow up with PCP in 1-2 weeks check blood pressure and titrate antihypertensive medications as tolerated. Please obtain BMP/CBC in one week   Home Health:no Equipment/Devices:none  Discharge Condition:Stable CODE STATUS:Full Diet recommendation: Heart Healthy  Brief/Interim Summary: 80 y.o. male past medical history of A. fib on Eliquis, essential hypertension diabetes mellitus type 2, chronic diastolic heart failure presents with worsening shortness of breath at rest more pronounced over the last 2 days, in the ED was found to have a systolic blood pressure of 221/105, subsequently decreased to 133/110 after receiving IV Lasix  Discharge Diagnoses:  Active Problems:   Type 2 diabetes mellitus ([name])   CAD hx of 2 stents to RCA in 2003,  80-90% stenosis of LAD/CFX June 2014- medical Rx   Peripheral arterial disease ([name])   Hypertensive emergency   CHF (congestive heart failure) ([name])   Acute exacerbation of CHF (congestive heart failure) ([name])   Acute diastolic CHF (congestive heart failure) ([name])   Acute metabolic encephalopathy  Acute metabolic encephalopathy/hypertensive urgency: He was started on his blood pressure medication, as his wife relate he had not been taking his medications regularly. MRI of the brain could not be done due to her back stimulator. CT scan of the head was done 72 hours after admission showed no acute findings but it did show chronic left temporal lobe malacia. EEG was done that showed normal weeks routine normal seizures oriented to formal discharge. The cause of his encephalopathy is unclear it is believed that it was due to his elevated blood pressure. Which she was restarted back here in  the hospital.  Chronic diastolic heart failure: Checks x-ray did show cardiomegaly with elevated BNP. Acute exacerbation of heart failure has been ruled out. No changes made to his medication.  Mild elevation in cardiac troponins: In the setting of hypertensive urgency, likely demand ischemia. Was started back on his Plavix and metoprolol he denies any chest pain or shortness of breath.  Diabetes mellitus type 2: With a last A1c of 8.4. He was: We will sliding scale insulin he will continue his glipizide at home.  Depression: Continue Lexapro.  Acute kidney injury on chronic kidney disease stage IIIa: With a baseline creatinine 1.4-1.6, elevation in his creatinine was likely due to overdiuresis and Lasix was held he was started back on IV fluids and his creatinine returned to baseline. He will continue Lasix daily at home.  Paroxysmal atrial fibrillation: Rate controlled continue Eliquis and metoprolol no changes made.  History of COPD: Noted stable on physical exam.   Discharge Instructions  Discharge Instructions     Diet - low sodium heart healthy   Complete by: As directed    Increase activity slowly   Complete by: As directed       Allergies as of 06/10/2021       Reactions   Fentanyl Other (See Comments)   Behavioral changes   Gabapentin Other (See Comments)   ankle swells   Lisinopril Other (See Comments)   unknown   Lyrica [pregabalin] Other (See Comments)   unknown   Metformin Diarrhea   Propofol Other (See Comments)   Heart rate dropped        Medication List     TAKE these medications    apixaban 5 MG Tabs  tablet Commonly known as: ELIQUIS Take 1 tablet (5 mg total) by mouth 2 (two) times daily.   atorvastatin 40 MG tablet Commonly known as: LIPITOR Take 40 mg by mouth at bedtime.   clopidogrel 75 MG tablet Commonly known as: PLAVIX Take 1 tablet (75 mg total) by mouth daily.   dapagliflozin propanediol 10 MG Tabs tablet Commonly  known as: Farxiga Take 1 tablet (10 mg total) by mouth daily before breakfast.   escitalopram 20 MG tablet Commonly known as: LEXAPRO Take 20 mg by mouth daily.   furosemide 20 MG tablet Commonly known as: LASIX Take 1 tablet (20 mg total) by mouth daily.   glipiZIDE 5 MG tablet Commonly known as: GLUCOTROL Take 5 mg by mouth daily before breakfast.   isosorbide mononitrate 60 MG 24 hr tablet Commonly known as: IMDUR Take 1 tablet (60 mg total) by mouth daily.   metoprolol tartrate 25 MG tablet Commonly known as: LOPRESSOR Take 1 tablet (25 mg total) by mouth 2 (two) times daily.   Ozempic (1 MG/DOSE) 2 MG/1.5ML Sopn Generic drug: Semaglutide (1 MG/DOSE) Inject 2.5 mg into the skin once a week.   pantoprazole 40 MG tablet Commonly known as: PROTONIX Take 40 mg by mouth daily.   sacubitril-valsartan 49-51 MG Commonly known as: ENTRESTO Take 1 tablet by mouth 2 (two) times daily.   spironolactone 25 MG tablet Commonly known as: ALDACTONE Take 0.5 tablets (12.5 mg total) by mouth daily.   SYSTANE FREE OP Place 1 drop into both eyes daily as needed (dry eyes).        Follow-up Information     [facility] Follow up.   Contact information: [phone]               Allergies  Allergen Reactions   Fentanyl Other (See Comments)    Behavioral changes   Gabapentin Other (See Comments)    ankle swells   Lisinopril Other (See Comments)    unknown   Lyrica [Pregabalin] Other (See Comments)    unknown   Metformin Diarrhea   Propofol Other (See Comments)    Heart rate dropped    Consultations: None   Procedures/Studies: CT HEAD WO CONTRAST ( )  Result Date: 06/08/2021 CLINICAL DATA:  80 year old male with atrial fibrillation on Eliquis. Increasing shortness of breath. Hypertensive, 221 systolic. Delirium. EXAM: CT HEAD WITHOUT CONTRAST TECHNIQUE: Contiguous axial images were obtained from the base of the skull through the vertex without  intravenous contrast. COMPARISON:  Head CT 01/01/2021, 03/27/2017. FINDINGS: Brain: Stable cerebral volume. Confluent bilateral white matter hypodensity appears stable. No midline shift, ventriculomegaly, mass effect, evidence of mass lesion, intracranial hemorrhage or evidence of cortically based acute infarction. Questionable small area of cortical encephalomalacia in the anterior mesial temporal lobe on series 5, image 34 is stable since 2018. Vascular: Calcified atherosclerosis at the skull base. No suspicious intracranial vascular hyperdensity. Skull: Negative. Sinuses/Orbits: Visualized paranasal sinuses and mastoids are clear. Other: No acute orbit or scalp soft tissue finding. IMPRESSION: 1. No acute intracranial abnormality. 2. Stable non contrast CT appearance of the brain with advanced white matter disease and questionable chronic left temporal lobe encephalomalacia. Electronically Signed   By: Odessa Fleming M.D.   On: 06/08/2021 10:58   DG Chest Portable 1 View  Result Date: 06/06/2021 CLINICAL DATA:  Shortness of breath. EXAM: PORTABLE CHEST 1 VIEW COMPARISON:  Chest x-ray 03/10/2021. FINDINGS: The heart is enlarged, unchanged. There is no focal lung consolidation, pleural effusion or pneumothorax. Thoracic stimulator  device is again noted. There are atherosclerotic calcifications of the aortic arch. No acute fractures are seen. IMPRESSION: No active disease. Stable cardiomegaly. Electronically Signed   By: Darliss Cheney M.D.   On: 06/06/2021 16:23   ECHOCARDIOGRAM COMPLETE  Result Date: 06/07/2021    ECHOCARDIOGRAM REPORT   Patient Name:   Albert Powell Date of Exam: 06/07/2021 Medical Rec #:  [id number]        Height:       69.0 in Accession #:    [id number]       Weight:       212.4 lb Date of Birth:  June 10, 1941        BSA:          2.120 m Patient Age:    80 years         BP:           146/77 mmHg Patient Gender: M                HR:           60 bpm. Exam Location:  Inpatient Procedure: 2D Echo,  Color Doppler, Cardiac Doppler and 3D Echo Indications:    I50.40* Unspecified combined systolic (congestive) and diastolic                 (congestive) heart failure  History:        Patient has prior history of Echocardiogram examinations, most                 recent 03/10/2021. CHF, CAD, Abnormal ECG, Arrythmias:Bradycardia                 and Atrial Fibrillation; Risk Factors:Diabetes, Dyslipidemia,                 Current Smoker and Hypertension.  Sonographer:    Sheralyn Boatman RDCS Referring Phys: [id number] [facility] A [place]  Sonographer Comments: Technically difficult study due to poor echo windows and patient is morbidly obese. Image acquisition challenging due to patient body habitus. Patient had apnea while sleeping during exam. IMPRESSIONS  1. Left ventricular ejection fraction, by estimation, is 60 to 65%. The left ventricle has normal function. The left ventricle has no regional wall motion abnormalities. There is mild concentric left ventricular hypertrophy. Left ventricular diastolic parameters are indeterminate. Elevated left ventricular end-diastolic pressure.  2. Right ventricular systolic function is normal. The right ventricular size is normal. Tricuspid regurgitation signal is inadequate for assessing PA pressure.  3. A small pericardial effusion is present. The pericardial effusion is posterior to the left ventricle.  4. The mitral valve is grossly normal. No evidence of mitral valve regurgitation. No evidence of mitral stenosis.  5. The aortic valve is grossly normal. Aortic valve regurgitation is not visualized. Mild aortic valve sclerosis is present, with no evidence of aortic valve stenosis.  6. The inferior vena cava is normal in size with greater than 50% respiratory variability, suggesting right atrial pressure of 3 mmHg. Comparison(s): No significant change from prior study. FINDINGS  Left Ventricle: Left ventricular ejection fraction, by estimation, is 60 to 65%. The left ventricle has normal  function. The left ventricle has no regional wall motion abnormalities. The left ventricular internal cavity size was normal in size. There is  mild concentric left ventricular hypertrophy. Left ventricular diastolic parameters are indeterminate. Elevated left ventricular end-diastolic pressure. The E/e' is 22.3. Right Ventricle: The right ventricular size is normal. Right vetricular wall thickness was not well  visualized. Right ventricular systolic function is normal. Tricuspid regurgitation signal is inadequate for assessing PA pressure. Left Atrium: Left atrial size was normal in size. Right Atrium: Right atrial size was normal in size. Pericardium: A small pericardial effusion is present. The pericardial effusion is posterior to the left ventricle. Mitral Valve: The mitral valve is grossly normal. No evidence of mitral valve regurgitation. No evidence of mitral valve stenosis. Tricuspid Valve: The tricuspid valve is grossly normal. Tricuspid valve regurgitation is not demonstrated. No evidence of tricuspid stenosis. Aortic Valve: The aortic valve is grossly normal. Aortic valve regurgitation is not visualized. Mild aortic valve sclerosis is present, with no evidence of aortic valve stenosis. Pulmonic Valve: The pulmonic valve was not well visualized. Pulmonic valve regurgitation is not visualized. Aorta: The aortic root and ascending aorta are structurally normal, with no evidence of dilitation. Venous: The inferior vena cava is normal in size with greater than 50% respiratory variability, suggesting right atrial pressure of 3 mmHg. IAS/Shunts: The interatrial septum was not well visualized.  LEFT VENTRICLE PLAX 2D LVIDd:         5.00 cm     Diastology LVIDs:         3.30 cm     LV e' medial:    2.20 cm/s LV PW:         1.10 cm     LV E/e' medial:  26.3 LV IVS:        1.10 cm     LV e' lateral:   3.16 cm/s LVOT diam:     2.30 cm     LV E/e' lateral: 18.3 LV SV:         54 LV SV Index:   25 LVOT Area:     4.15  cm                             3D Volume EF: LV Volumes (MOD)           3D EF:        52 % LV vol d, MOD A2C: 65.6 ml LV EDV:       120 ml LV vol d, MOD A4C: 80.8 ml LV ESV:       58 ml LV vol s, MOD A2C: 35.7 ml LV SV:        62 ml LV vol s, MOD A4C: 35.5 ml LV SV MOD A2C:     30.0 ml LV SV MOD A4C:     80.8 ml LV SV MOD BP:      39.2 ml RIGHT VENTRICLE            IVC RV S prime:     7.35 cm/s  IVC diam: 1.60 cm TAPSE (M-mode): 1.2 cm LEFT ATRIUM             Index       RIGHT ATRIUM          Index LA diam:        3.50 cm 1.65 cm/m  RA Area:     9.03 cm LA Vol (A2C):   26.3 ml 12.41 ml/m RA Volume:   12.80 ml 6.04 ml/m LA Vol (A4C):   31.6 ml 14.91 ml/m LA Biplane Vol: 31.7 ml 14.96 ml/m  AORTIC VALVE LVOT Vmax:   76.40 cm/s LVOT Vmean:  42.200 cm/s LVOT VTI:    0.130 m  AORTA Ao Root diam: 3.50 cm MITRAL VALVE  MV Area (PHT): 2.62 cm    SHUNTS MV Decel Time: 289 msec    Systemic VTI:  0.13 m MV E velocity: 57.80 cm/s  Systemic Diam: 2.30 cm MV A velocity: 69.80 cm/s MV E/A ratio:  0.83 Jodelle Red MD Electronically signed by Jodelle Red MD Signature Date/Time: 06/07/2021/2:09:10 PM    Final    (Echo, Carotid, EGD, Colonoscopy, ERCP)    Subjective: No complaints tolerating his diet.  Discharge Exam: Vitals:   06/10/21 0425 06/10/21 0600  BP: (!) 185/79 (!) 194/68  Pulse:  78  Resp: 19   Temp: 98.2 F (36.8 C)   SpO2:     Vitals:   06/09/21 2200 06/10/21 0425 06/10/21 0427 06/10/21 0600  BP: (!) 154/55 (!) 185/79  (!) 194/68  Pulse: 61   78  Resp:  19    Temp:  98.2 F (36.8 C)    TempSrc:  Oral    SpO2: 98%     Weight:   97.9 kg   Height:        General: Pt is alert, awake, not in acute distress Cardiovascular: RRR, S1/S2 +, no rubs, no gallops Respiratory: CTA bilaterally, no wheezing, no rhonchi Abdominal: Soft, NT, ND, bowel sounds + Extremities: no edema, no cyanosis    The results of significant diagnostics from this hospitalization (including  imaging, microbiology, ancillary and laboratory) are listed below for reference.     Microbiology: Recent Results (from the past 240 hour(s))  Resp Panel by RT-PCR (Flu A&B, Covid) Nasopharyngeal Swab     Status: None   Collection Time: 06/06/21  2:56 PM   Specimen: Nasopharyngeal Swab; Nasopharyngeal(NP) swabs in vial transport medium  Result Value Ref Range Status   SARS Coronavirus 2 by RT PCR NEGATIVE NEGATIVE Final    Comment: (NOTE) SARS-CoV-2 target nucleic acids are NOT DETECTED.  The SARS-CoV-2 RNA is generally detectable in upper respiratory specimens during the acute phase of infection. The lowest concentration of SARS-CoV-2 viral copies this assay can detect is 138 copies/mL. A negative result does not preclude SARS-Cov-2 infection and should not be used as the sole basis for treatment or other patient management decisions. A negative result may occur with  improper specimen collection/handling, submission of specimen other than nasopharyngeal swab, presence of viral mutation(s) within the areas targeted by this assay, and inadequate number of viral copies(<138 copies/mL). A negative result must be combined with clinical observations, patient history, and epidemiological information. The expected result is Negative.  Fact Sheet for Patients:  BloggerCourse.com  Fact Sheet for Healthcare Providers:  SeriousBroker.it  This test is no t yet approved or cleared by the [place] FDA and  has been authorized for detection and/or diagnosis of SARS-CoV-2 by FDA under an Emergency Use Authorization (EUA). This EUA will remain  in effect (meaning this test can be used) for the duration of the COVID-19 declaration under Section 564(b)(1) of the Act, 21 U.S.C.section 360bbb-3(b)(1), unless the authorization is terminated  or revoked sooner.       Influenza A by PCR NEGATIVE NEGATIVE Final   Influenza B by PCR NEGATIVE  NEGATIVE Final    Comment: (NOTE) The Xpert Xpress SARS-CoV-2/FLU/RSV plus assay is intended as an aid in the diagnosis of influenza from Nasopharyngeal swab specimens and should not be used as a sole basis for treatment. Nasal washings and aspirates are unacceptable for Xpert Xpress SARS-CoV-2/FLU/RSV testing.  Fact Sheet for Patients: BloggerCourse.com  Fact Sheet for Healthcare Providers: SeriousBroker.it  This test is  not yet approved or cleared by the [place] and has been authorized for detection and/or diagnosis of SARS-CoV-2 by FDA under an Emergency Use Authorization (EUA). This EUA will remain in effect (meaning this test can be used) for the duration of the COVID-19 declaration under Section 564(b)(1) of the Act, 21 U.S.C. section 360bbb-3(b)(1), unless the authorization is terminated or revoked.  Performed at [facility], [address], [place], [place] [address]      Labs: BNP (last 3 results) Recent Labs    01/01/21 1350 03/10/21 0307 06/06/21 1456  BNP 1,024.5* 486.9* 1,349.7*   Basic Metabolic Panel: Recent Labs  Lab 06/06/21 1500 06/07/21 0106 06/08/21 0233 06/09/21 0156 06/10/21 0132  NA 136 138 137 133* 136  K 3.7 3.5 3.6 3.5 3.6  CL 99 99 99 96* 102  CO2 GLUCOSE 145* 152* 109* 104* 139*  BUN 20 20 35* 44* 36*  CREATININE 1.36* 1.55* 2.47* 2.12* 1.87*  CALCIUM 9.2 8.8* 9.0 8.6* 8.9   Liver Function Tests: Recent Labs  Lab 06/06/21 1500  AST 23  ALT 20  ALKPHOS 75  BILITOT 0.6  PROT 7.0  ALBUMIN 3.7   No results for input(s): LIPASE, AMYLASE in the last 168 hours. No results for input(s): AMMONIA in the last 168 hours. CBC: Recent Labs  Lab 06/06/21 1500  WBC 8.2  NEUTROABS 6.0  HGB 16.0  HCT 48.4  MCV 87.1  PLT 229   Cardiac Enzymes: No results for input(s): CKTOTAL, CKMB, CKMBINDEX, TROPONINI in the last 168 hours. BNP: Invalid input(s):  POCBNP CBG: Recent Labs  Lab 06/09/21 0746 06/09/21 1141 06/09/21 1637 06/09/21 2114 06/10/21 0751  GLUCAP 116* 222* 91 147* 141*   D-Dimer No results for input(s): DDIMER in the last 72 hours. Hgb A1c Recent Labs    06/09/21 0901  HGBA1C 7.4*   Lipid Profile No results for input(s): CHOL, HDL, LDLCALC, TRIG, CHOLHDL, LDLDIRECT in the last 72 hours. Thyroid function studies No results for input(s): TSH, T4TOTAL, T3FREE, THYROIDAB in the last 72 hours.  Invalid input(s): FREET3 Anemia work up No results for input(s): VITAMINB12, FOLATE, FERRITIN, TIBC, IRON, RETICCTPCT in the last 72 hours. Urinalysis    Component Value Date/Time   COLORURINE YELLOW 01/01/2021 1351   APPEARANCEUR CLEAR 01/01/2021 1351   LABSPEC 1.009 01/01/2021 1351   PHURINE 6.0 01/01/2021 1351   GLUCOSEU >=500 (A) 01/01/2021 1351   HGBUR SMALL (A) 01/01/2021 1351   BILIRUBINUR NEGATIVE 01/01/2021 1351   KETONESUR NEGATIVE 01/01/2021 1351   PROTEINUR >=300 (A) 01/01/2021 1351   UROBILINOGEN 1.0 05/31/2013 1432   NITRITE NEGATIVE 01/01/2021 1351   LEUKOCYTESUR NEGATIVE 01/01/2021 1351   Sepsis Labs Invalid input(s): PROCALCITONIN,  WBC,  LACTICIDVEN Microbiology Recent Results (from the past 240 hour(s))  Resp Panel by RT-PCR (Flu A&B, Covid) Nasopharyngeal Swab     Status: None   Collection Time: 06/06/21  2:56 PM   Specimen: Nasopharyngeal Swab; Nasopharyngeal(NP) swabs in vial transport medium  Result Value Ref Range Status   SARS Coronavirus 2 by RT PCR NEGATIVE NEGATIVE Final    Comment: (NOTE) SARS-CoV-2 target nucleic acids are NOT DETECTED.  The SARS-CoV-2 RNA is generally detectable in upper respiratory specimens during the acute phase of infection. The lowest concentration of SARS-CoV-2 viral copies this assay can detect is 138 copies/mL. A negative result does not preclude SARS-Cov-2 infection and should not be used as the sole basis for treatment or other patient  management  decisions. A negative result may occur with  improper specimen collection/handling, submission of specimen other than nasopharyngeal swab, presence of viral mutation(s) within the areas targeted by this assay, and inadequate number of viral copies(<138 copies/mL). A negative result must be combined with clinical observations, patient history, and epidemiological information. The expected result is Negative.  Fact Sheet for Patients:  BloggerCourse.com  Fact Sheet for Healthcare Providers:  SeriousBroker.it  This test is no t yet approved or cleared by the [place] FDA and  has been authorized for detection and/or diagnosis of SARS-CoV-2 by FDA under an Emergency Use Authorization (EUA). This EUA will remain  in effect (meaning this test can be used) for the duration of the COVID-19 declaration under Section 564(b)(1) of the Act, 21 U.S.C.section 360bbb-3(b)(1), unless the authorization is terminated  or revoked sooner.       Influenza A by PCR NEGATIVE NEGATIVE Final   Influenza B by PCR NEGATIVE NEGATIVE Final    Comment: (NOTE) The Xpert Xpress SARS-CoV-2/FLU/RSV plus assay is intended as an aid in the diagnosis of influenza from Nasopharyngeal swab specimens and should not be used as a sole basis for treatment. Nasal washings and aspirates are unacceptable for Xpert Xpress SARS-CoV-2/FLU/RSV testing.  Fact Sheet for Patients: BloggerCourse.com  Fact Sheet for Healthcare Providers: SeriousBroker.it  This test is not yet approved or cleared by the [place] FDA and has been authorized for detection and/or diagnosis of SARS-CoV-2 by FDA under an Emergency Use Authorization (EUA). This EUA will remain in effect (meaning this test can be used) for the duration of the COVID-19 declaration under Section 564(b)(1) of the Act, 21 U.S.C. section 360bbb-3(b)(1), unless the  authorization is terminated or revoked.  Performed at [facility], [address], [place], [place] [address]      SIGNED:   Marinda Elk, MD  [facility] 06/10/2021, 8:16 AM Pager   If 7PM-7AM, please contact night-coverage [email or web address] Password [email or web address]

## 2021-06-13 DIAGNOSIS — F32A Depression, unspecified: Secondary | ICD-10-CM | POA: Diagnosis not present

## 2021-06-13 DIAGNOSIS — I480 Paroxysmal atrial fibrillation: Secondary | ICD-10-CM | POA: Diagnosis not present

## 2021-06-13 DIAGNOSIS — I5032 Chronic diastolic (congestive) heart failure: Secondary | ICD-10-CM | POA: Diagnosis not present

## 2021-06-13 DIAGNOSIS — I509 Heart failure, unspecified: Secondary | ICD-10-CM | POA: Diagnosis not present

## 2021-06-13 DIAGNOSIS — Z7901 Long term (current) use of anticoagulants: Secondary | ICD-10-CM | POA: Diagnosis not present

## 2021-06-13 DIAGNOSIS — E1122 Type 2 diabetes mellitus with diabetic chronic kidney disease: Secondary | ICD-10-CM | POA: Diagnosis not present

## 2021-06-13 DIAGNOSIS — I2510 Atherosclerotic heart disease of native coronary artery without angina pectoris: Secondary | ICD-10-CM | POA: Diagnosis not present

## 2021-06-13 DIAGNOSIS — E1151 Type 2 diabetes mellitus with diabetic peripheral angiopathy without gangrene: Secondary | ICD-10-CM | POA: Diagnosis not present

## 2021-06-13 DIAGNOSIS — M519 Unspecified thoracic, thoracolumbar and lumbosacral intervertebral disc disorder: Secondary | ICD-10-CM | POA: Diagnosis not present

## 2021-06-13 DIAGNOSIS — I130 Hypertensive heart and chronic kidney disease with heart failure and stage 1 through stage 4 chronic kidney disease, or unspecified chronic kidney disease: Secondary | ICD-10-CM | POA: Diagnosis not present

## 2021-06-13 DIAGNOSIS — Z9181 History of falling: Secondary | ICD-10-CM | POA: Diagnosis not present

## 2021-06-13 DIAGNOSIS — J449 Chronic obstructive pulmonary disease, unspecified: Secondary | ICD-10-CM | POA: Diagnosis not present

## 2021-06-13 DIAGNOSIS — E1136 Type 2 diabetes mellitus with diabetic cataract: Secondary | ICD-10-CM | POA: Diagnosis not present

## 2021-06-13 DIAGNOSIS — N1831 Chronic kidney disease, stage 3a: Secondary | ICD-10-CM | POA: Diagnosis not present

## 2021-06-13 DIAGNOSIS — Z7984 Long term (current) use of oral hypoglycemic drugs: Secondary | ICD-10-CM | POA: Diagnosis not present

## 2021-06-13 DIAGNOSIS — K219 Gastro-esophageal reflux disease without esophagitis: Secondary | ICD-10-CM | POA: Diagnosis not present

## 2021-06-13 DIAGNOSIS — Z87891 Personal history of nicotine dependence: Secondary | ICD-10-CM | POA: Diagnosis not present

## 2021-06-13 DIAGNOSIS — E785 Hyperlipidemia, unspecified: Secondary | ICD-10-CM | POA: Diagnosis not present

## 2021-06-14 ENCOUNTER — Encounter: Payer: Self-pay | Admitting: Cardiovascular Disease

## 2021-06-14 ENCOUNTER — Other Ambulatory Visit: Payer: Self-pay

## 2021-06-14 ENCOUNTER — Ambulatory Visit (INDEPENDENT_AMBULATORY_CARE_PROVIDER_SITE_OTHER): Payer: Medicare Other | Admitting: Cardiovascular Disease

## 2021-06-14 DIAGNOSIS — I739 Peripheral vascular disease, unspecified: Secondary | ICD-10-CM

## 2021-06-14 DIAGNOSIS — I6522 Occlusion and stenosis of left carotid artery: Secondary | ICD-10-CM

## 2021-06-14 DIAGNOSIS — E782 Mixed hyperlipidemia: Secondary | ICD-10-CM | POA: Diagnosis not present

## 2021-06-14 DIAGNOSIS — I2510 Atherosclerotic heart disease of native coronary artery without angina pectoris: Secondary | ICD-10-CM | POA: Diagnosis not present

## 2021-06-14 DIAGNOSIS — I5031 Acute diastolic (congestive) heart failure: Secondary | ICD-10-CM

## 2021-06-14 NOTE — Progress Notes (Signed)
06/14/2021 Albert Powell   1941-01-05  [id number]  Primary Physician [name], MD Primary Cardiologist: [name] MD [name], [place], [place]  HPI:  Albert Powell is a 80 y.o.  moderately overweight married Caucasian male with known history of CAD. I last saw in the office 08/17/2020.[name]  He is  status post stenting with 2 stents to RCA in 2003 and residual LAD, hypertension and hyperlipidemia and diabetes mellitus on diet, presented to the ER because of jaw discomfort with chest pressure for 2-3 days. Patient's symptoms usually happen in the night. .  Patient was negative for myocardial infarction he had a LexiScan Myoview 02/07/13 which was negative for ischemia, but due to similarities of his prior coronary disease and the symptoms and multiple risk factors for coronary disease including family history hypertension diabetes known coronary artery disease patient was scheduled for cardiac catheterization with Dr. Allyson Sabal. Revealing 80-90% focal distal LAD disease and a 1.5-1.7 mm vessel left circumflex nondominant with a 90% AV groove stenosis in a small vessel there was a moderate to large obtuse marginal branch with a 60% proximal segmental calcified stenosis. The RCA had widely patent stents. EF was 60%. Due to the small size of the vessels and the negative nuclear stress test that are very filled medical therapy was the patient's best option.  Patient was seen back in followup last week with a heart rate of 48 symptomatic with weakness feeling very tired. He still had some shortness of breath but no jaw pain which is his anginal equivalent. Medications were adjusted because Lopressor actually had him hold it one day and then he was started back half a tablet daily he felt better so he is now resumed half tablet twice a day.  Today's back for followup initially his heart rate was 60 after he walked in the room but after sitting for 10-15 minutes EKG revealed sinus bradycardia  rate of 49 but no acute changes otherwise. His blood pressure was improved from his last visit.  Additionally he denies any shortness of breath he denies any jaw pain.  Since he was seen by [name] 02/24/13 and his beta blockers were adjusted he no longer is bradycardic or symptomatic. He denies chest pain or shortness of breath. I do hear a carotid bruit on exam and cardiac echo Doppler studies on him.  I performed carotid Dopplers on him 04/11/13 which revealed a moderately high-grade left ICA stenosis. He is neurologically asymptomatic. Based on the intermediate nature of his Doppler study is unclear to me whether he has a lesion that can be followed conservatively by ultrasound or requires surgical revascularization. Because of this I decided to proceed with carotid angiography to further define the degree of stenosis.this was performed on 04/25/13 revealing a 95% proximal left internal carotid artery stenosis which suddenly underwent endarterectomy by Dr. [name] on 06/02/13.I also performed angiography on his aortoiliac Arteries revealing 90% calcified right extrailiac artery stenosis.I performed diamondback or rotational atherectomy, PT and stenting of his right external iliac artery on 10/20/13. He had an excellent angiographic and clinical result. His post procedure Dopplers have improved. He did have a small pseudoaneurysm in his right groin which was successfully compressed at the hospital and has remained closed.  Since I saw me year ago his major complaints have been lifestyle limiting back and hip pain. He's had imaging studies that suggest spinal stenosis and wishes to undergo decompressive laminectomy by Dr. Shon Baton in the upcoming future.[name] He  had lower extremity Dopplers performed in our office 04/08/17 that showed a right ABI of 0.85 and a left aBI of 0.96.   I repeated his lower extremity arterial Doppler studies which showed some progression of disease however he ultimately underwent  angiography by Dr. [name] 03/30/2018, 2 months after his Dopplers, and had covered stenting of both iliac arteries.  He continues to have similar symptoms as he did prior to his intervention.  He denies chest pain or shortness of breath.   I performed coronary angiography on him the setting of chest pain with A. fib with RVR 04/02/2020 revealing a patent RCA stent with an intermediate lesion just beyond the stented segment that was negative by DFR.  Medical therapy is recommended.  He did convert spontaneously to sinus rhythm and is on Eliquis oral anticoagulation in addition to Plavix.   He had lower extremity arterial Doppler studies performed 08/15/2020 revealing a right ABI of 0.26 and a left of 0.80.  He had monophasic right iliac waveforms and significant disease in his right common femoral artery and SFA.  He does have lifestyle limiting right calf claudication wishes to proceed with angiography.  I performed peripheral angiography on him 08/27/2020 revealing patent bilateral common iliac artery stents and right extrailiac artery stent.  He did have 50 to 70% calcified stenoses in his left extrailiac artery and common femoral artery as well as high-grade diffuse calcified 90% calcified stenoses in the distal right extrailiac artery and common femoral artery as well as a focal 99% mid right SFA stenosis.  My intent was to send him back to Dr. [name] for considerations for surgical revascularization, endarterectomy with patch angioplasty but however this never occurred.  He was recently admitted with hypertension diastolic heart failure on 06/06/2021 and was diuresed.  He was discharged home 4 days later.  He is now watching his diet including salt restriction and is not on oral diuretic.  He has no peripheral edema and his breathing has improved.   Current Meds  Medication Sig   apixaban (ELIQUIS) 5 MG TABS tablet Take 1 tablet (5 mg total) by mouth 2 (two) times daily.   atorvastatin (LIPITOR) 40  MG tablet Take 40 mg by mouth at bedtime.    clopidogrel (PLAVIX) 75 MG tablet Take 1 tablet (75 mg total) by mouth daily.   dapagliflozin propanediol (FARXIGA) 10 MG TABS tablet Take 1 tablet (10 mg total) by mouth daily before breakfast.   escitalopram (LEXAPRO) 20 MG tablet Take 20 mg by mouth daily.   furosemide (LASIX) 20 MG tablet Take 1 tablet (20 mg total) by mouth daily.   glipiZIDE (GLUCOTROL) 5 MG tablet Take 5 mg by mouth daily before breakfast.   isosorbide mononitrate (IMDUR) 60 MG 24 hr tablet Take 1 tablet (60 mg total) by mouth daily.   metoprolol tartrate (LOPRESSOR) 25 MG tablet Take 1 tablet (25 mg total) by mouth 2 (two) times daily.   OZEMPIC, 1 MG/DOSE, 2 MG/1.5ML SOPN Inject 2.5 mg into the skin once a week.    pantoprazole (PROTONIX) 40 MG tablet Take 40 mg by mouth daily.    Polyethyl Glycol-Propyl Glycol (SYSTANE FREE OP) Place 1 drop into both eyes daily as needed (dry eyes).   sacubitril-valsartan (ENTRESTO) 49-51 MG Take 1 tablet by mouth 2 (two) times daily.   spironolactone (ALDACTONE) 25 MG tablet Take 0.5 tablets (12.5 mg total) by mouth daily.   Current Facility-Administered Medications for the 06/14/21 encounter (Office Visit) with [name],  MD  Medication   sodium chloride flush (NS) 0.9 % injection 3 mL     Allergies  Allergen Reactions   Fentanyl Other (See Comments)    Behavioral changes   Gabapentin Other (See Comments)    ankle swells   Lisinopril Other (See Comments)    unknown   Lyrica [Pregabalin] Other (See Comments)    unknown   Metformin Diarrhea   Propofol Other (See Comments)    Heart rate dropped    Social History   Socioeconomic History   Marital status: Married    Spouse name: Malachi Kinzler   Number of children: 1   Years of education: Not on file   Highest education level: High school graduate  Occupational History   Occupation: retired  Tobacco Use   Smoking status: Former    Packs/day: 1.00    Years:  57.00    Pack years: 57.00    Types: Cigarettes    Quit date: 04/06/2013    Years since quitting: 8.1   Smokeless tobacco: Never   Tobacco comments:    pt states that he is using the vapor cigs  Vaping Use   Vaping Use: Every day   Start date: 09/15/2016   Substances: Nicotine  Substance and Sexual Activity   Alcohol use: Yes    Alcohol/week: 1.0 standard drink    Types: 1 Glasses of wine per week    Comment: "very little"   Drug use: No   Sexual activity: Not on file  Other Topics Concern   Not on file  Social History Narrative   Not on file   Social Determinants of Health   Financial Resource Strain: Medium Risk   Difficulty of Paying Living Expenses: Somewhat hard  Food Insecurity: No Food Insecurity   Worried About Running Out of Food in the Last Year: Never true   Ran Out of Food in the Last Year: Never true  Transportation Needs: No Transportation Needs   Lack of Transportation (Medical): No   Lack of Transportation (Non-Medical): No  Physical Activity: Not on file  Stress: Not on file  Social Connections: Not on file  Intimate Partner Violence: Not on file     Review of Systems: General: negative for chills, fever, night sweats or weight changes.  Cardiovascular: negative for chest pain, dyspnea on exertion, edema, orthopnea, palpitations, paroxysmal nocturnal dyspnea or shortness of breath Dermatological: negative for rash Respiratory: negative for cough or wheezing Urologic: negative for hematuria Abdominal: negative for nausea, vomiting, diarrhea, bright red blood per rectum, melena, or hematemesis Neurologic: negative for visual changes, syncope, or dizziness All other systems reviewed and are otherwise negative except as noted above.    Blood pressure (!) 150/66, pulse 61, height 5\' 9"  (1.753 m), weight 229 lb 6.4 oz (104.1 kg), SpO2 98 %.  General appearance: alert and no distress Neck: no adenopathy, no carotid bruit, no JVD, supple, symmetrical,  trachea midline, and thyroid not enlarged, symmetric, no tenderness/mass/nodules Lungs: clear to auscultation bilaterally Heart: regular rate and rhythm, S1, S2 normal, no murmur, click, rub or gallop Extremities: extremities normal, atraumatic, no cyanosis or edema Pulses: 2+ and symmetric Skin: Skin color, texture, turgor normal. No rashes or lesions Neurologic: Grossly normal  EKG not performed today  ASSESSMENT AND PLAN:   CAD hx of 2 stents to RCA in 2003,  80-90% stenosis of LAD/CFX June 2014- medical Rx History of CAD status post RCA stenting in 2003 with residual LAD disease.  He was catheterized again  in 2014 revealing a 80 to 90% focal distal LAD stenosis and a small vessel as well as an AV groove stenosis and a small vessel as well.  The RCA was widely patent.  Medical therapy was recommended.  I performed cardiac catheterization on him 04/02/2020 revealing a patent RCA stent with an intermediate lesion just beyond the stented segment that was negative by DFR.  Medical therapy was recommended at that time.  He denies chest pain.  Hyperlipidemia History of hyperlipidemia on statin therapy with lipid profile performed 04/15/2021 revealing total cholesterol of 185, LDL of 97 and HDL 49.  Carotid artery disease ([name]) History of carotid artery disease status post cerebral angiography by myself 04/25/2013 revealing a 95% proximal left internal carotid artery stenosis.  He underwent elective left carotid endarterectomy my request by Dr. [name] 06/02/2013.  Peripheral arterial disease ([name]) History of peripheral arterial disease status post diamondback orbital rotational atherectomy, PTA and stenting of the right extrailiac artery by myself 10/20/2013.  He did have peripheral angiography and bilateral iliac stenting by Dr. [name] 03/30/2018.  Because of significant claudication I performed peripheral angiography on him 08/27/2020 revealing patent common iliac and right extrailiac artery stents.   He did have 50 to 70% calcified stenoses in the left external iliac artery and common femoral artery as well as high-grade calcified right distal extrailiac and common femoral stenosis.  He had a 99% focal mid right SFA calcified stenosis as well.  My intention was to refer him back to Dr. [name] for surgical revascularization of his right common femoral artery however this never happened.  He does continue to complain of some claudication.  Acute diastolic CHF (congestive heart failure) ([name]) History of chronic diastolic heart failure with recent admission for hypertension and diastolic heart failure.  He was in the hospital for 3 to 4 days 06/06/2021 and was diuresed.  His 2D echocardiogram performed 06/07/2021 revealed normal LV systolic function without significant valvular abnormalities.  Since being at home he is watching his salt intake.  His weight has remained stable.  He has no peripheral edema.  He is on oral diuretics.     [name] MD FACP,FACC,FAHA, [facility] 06/14/2021 4:05 PM

## 2021-06-14 NOTE — Assessment & Plan Note (Signed)
History of chronic diastolic heart failure with recent admission for hypertension and diastolic heart failure.  He was in the hospital for 3 to 4 days 06/06/2021 and was diuresed.  His 2D echocardiogram performed 06/07/2021 revealed normal LV systolic function without significant valvular abnormalities.  Since being at home he is watching his salt intake.  His weight has remained stable.  He has no peripheral edema.  He is on oral diuretics.

## 2021-06-14 NOTE — Assessment & Plan Note (Addendum)
History of CAD status post RCA stenting in 2003 with residual LAD disease.  He was catheterized again in 2014 revealing a 80 to 90% focal distal LAD stenosis and a small vessel as well as an AV groove stenosis and a small vessel as well.  The RCA was widely patent.  Medical therapy was recommended.  I performed cardiac catheterization on him 04/02/2020 revealing a patent RCA stent with an intermediate lesion just beyond the stented segment that was negative by DFR.  Medical therapy was recommended at that time.  He denies chest pain.

## 2021-06-14 NOTE — Assessment & Plan Note (Signed)
History of hyperlipidemia on statin therapy with lipid profile performed 04/15/2021 revealing total cholesterol of 185, LDL of 97 and HDL 49.

## 2021-06-14 NOTE — Assessment & Plan Note (Signed)
History of carotid artery disease status post cerebral angiography by myself 04/25/2013 revealing a 95% proximal left internal carotid artery stenosis.  He underwent elective left carotid endarterectomy my request by Dr. [name] 06/02/2013.

## 2021-06-14 NOTE — Assessment & Plan Note (Signed)
History of peripheral arterial disease status post diamondback orbital rotational atherectomy, PTA and stenting of the right extrailiac artery by myself 10/20/2013.  He did have peripheral angiography and bilateral iliac stenting by Dr. [name] 03/30/2018.  Because of significant claudication I performed peripheral angiography on him 08/27/2020 revealing patent common iliac and right extrailiac artery stents.  He did have 50 to 70% calcified stenoses in the left external iliac artery and common femoral artery as well as high-grade calcified right distal extrailiac and common femoral stenosis.  He had a 99% focal mid right SFA calcified stenosis as well.  My intention was to refer him back to Dr. [name] for surgical revascularization of his right common femoral artery however this never happened.  He does continue to complain of some claudication.

## 2021-06-14 NOTE — Patient Instructions (Signed)

## 2021-07-26 ENCOUNTER — Ambulatory Visit: Payer: Medicare Other | Admitting: Cardiovascular Disease

## 2021-08-05 ENCOUNTER — Emergency Department (HOSPITAL_COMMUNITY): Payer: Medicare Other

## 2021-08-05 ENCOUNTER — Inpatient Hospital Stay (HOSPITAL_COMMUNITY)
Admission: EM | Admit: 2021-08-05 | Discharge: 2021-08-09 | DRG: 291 | Disposition: A | Discharge status: Home / Self Care | Payer: Medicare Other | Attending: Internal Medicine | Admitting: Internal Medicine

## 2021-08-05 DIAGNOSIS — I2510 Atherosclerotic heart disease of native coronary artery without angina pectoris: Secondary | ICD-10-CM | POA: Diagnosis not present

## 2021-08-05 DIAGNOSIS — N1831 Chronic kidney disease, stage 3a: Secondary | ICD-10-CM | POA: Diagnosis present

## 2021-08-05 DIAGNOSIS — J90 Pleural effusion, not elsewhere classified: Secondary | ICD-10-CM | POA: Diagnosis not present

## 2021-08-05 DIAGNOSIS — F4321 Adjustment disorder with depressed mood: Secondary | ICD-10-CM | POA: Diagnosis not present

## 2021-08-05 DIAGNOSIS — I509 Heart failure, unspecified: Principal | ICD-10-CM

## 2021-08-05 DIAGNOSIS — Z20822 Contact with and (suspected) exposure to covid-19: Secondary | ICD-10-CM | POA: Diagnosis not present

## 2021-08-05 DIAGNOSIS — Z66 Do not resuscitate: Secondary | ICD-10-CM | POA: Diagnosis not present

## 2021-08-05 DIAGNOSIS — E669 Obesity, unspecified: Secondary | ICD-10-CM | POA: Diagnosis present

## 2021-08-05 DIAGNOSIS — I739 Peripheral vascular disease, unspecified: Secondary | ICD-10-CM | POA: Diagnosis not present

## 2021-08-05 DIAGNOSIS — Z79899 Other long term (current) drug therapy: Secondary | ICD-10-CM

## 2021-08-05 DIAGNOSIS — Z833 Family history of diabetes mellitus: Secondary | ICD-10-CM

## 2021-08-05 DIAGNOSIS — R739 Hyperglycemia, unspecified: Secondary | ICD-10-CM | POA: Diagnosis not present

## 2021-08-05 DIAGNOSIS — R0902 Hypoxemia: Secondary | ICD-10-CM | POA: Diagnosis not present

## 2021-08-05 DIAGNOSIS — J449 Chronic obstructive pulmonary disease, unspecified: Secondary | ICD-10-CM | POA: Diagnosis not present

## 2021-08-05 DIAGNOSIS — Z955 Presence of coronary angioplasty implant and graft: Secondary | ICD-10-CM | POA: Diagnosis not present

## 2021-08-05 DIAGNOSIS — Z6833 Body mass index (BMI) 33.0-33.9, adult: Secondary | ICD-10-CM | POA: Diagnosis not present

## 2021-08-05 DIAGNOSIS — Z888 Allergy status to other drugs, medicaments and biological substances status: Secondary | ICD-10-CM

## 2021-08-05 DIAGNOSIS — E1151 Type 2 diabetes mellitus with diabetic peripheral angiopathy without gangrene: Secondary | ICD-10-CM | POA: Diagnosis not present

## 2021-08-05 DIAGNOSIS — E1122 Type 2 diabetes mellitus with diabetic chronic kidney disease: Secondary | ICD-10-CM | POA: Diagnosis present

## 2021-08-05 DIAGNOSIS — Z794 Long term (current) use of insulin: Secondary | ICD-10-CM

## 2021-08-05 DIAGNOSIS — Z7985 Long-term (current) use of injectable non-insulin antidiabetic drugs: Secondary | ICD-10-CM | POA: Diagnosis not present

## 2021-08-05 DIAGNOSIS — I130 Hypertensive heart and chronic kidney disease with heart failure and stage 1 through stage 4 chronic kidney disease, or unspecified chronic kidney disease: Secondary | ICD-10-CM | POA: Diagnosis not present

## 2021-08-05 DIAGNOSIS — R0602 Shortness of breath: Secondary | ICD-10-CM | POA: Diagnosis not present

## 2021-08-05 DIAGNOSIS — E785 Hyperlipidemia, unspecified: Secondary | ICD-10-CM | POA: Diagnosis not present

## 2021-08-05 DIAGNOSIS — I480 Paroxysmal atrial fibrillation: Secondary | ICD-10-CM | POA: Diagnosis not present

## 2021-08-05 DIAGNOSIS — R110 Nausea: Secondary | ICD-10-CM | POA: Diagnosis not present

## 2021-08-05 DIAGNOSIS — I5033 Acute on chronic diastolic (congestive) heart failure: Secondary | ICD-10-CM | POA: Diagnosis not present

## 2021-08-05 DIAGNOSIS — I160 Hypertensive urgency: Secondary | ICD-10-CM

## 2021-08-05 DIAGNOSIS — Z87891 Personal history of nicotine dependence: Secondary | ICD-10-CM

## 2021-08-05 DIAGNOSIS — Z885 Allergy status to narcotic agent status: Secondary | ICD-10-CM

## 2021-08-05 DIAGNOSIS — I110 Hypertensive heart disease with heart failure: Secondary | ICD-10-CM | POA: Diagnosis not present

## 2021-08-05 DIAGNOSIS — Z7984 Long term (current) use of oral hypoglycemic drugs: Secondary | ICD-10-CM | POA: Diagnosis not present

## 2021-08-05 DIAGNOSIS — Z823 Family history of stroke: Secondary | ICD-10-CM

## 2021-08-05 DIAGNOSIS — Z825 Family history of asthma and other chronic lower respiratory diseases: Secondary | ICD-10-CM

## 2021-08-05 DIAGNOSIS — J811 Chronic pulmonary edema: Secondary | ICD-10-CM | POA: Diagnosis not present

## 2021-08-05 DIAGNOSIS — Z7901 Long term (current) use of anticoagulants: Secondary | ICD-10-CM | POA: Diagnosis not present

## 2021-08-05 DIAGNOSIS — Z7902 Long term (current) use of antithrombotics/antiplatelets: Secondary | ICD-10-CM

## 2021-08-05 DIAGNOSIS — Z83438 Family history of other disorder of lipoprotein metabolism and other lipidemia: Secondary | ICD-10-CM

## 2021-08-05 DIAGNOSIS — E876 Hypokalemia: Secondary | ICD-10-CM | POA: Diagnosis present

## 2021-08-05 DIAGNOSIS — E7800 Pure hypercholesterolemia, unspecified: Secondary | ICD-10-CM | POA: Diagnosis not present

## 2021-08-05 DIAGNOSIS — I493 Ventricular premature depolarization: Secondary | ICD-10-CM | POA: Diagnosis not present

## 2021-08-05 DIAGNOSIS — Z8371 Family history of colonic polyps: Secondary | ICD-10-CM

## 2021-08-05 DIAGNOSIS — Z8249 Family history of ischemic heart disease and other diseases of the circulatory system: Secondary | ICD-10-CM

## 2021-08-05 LAB — CBC WITH DIFFERENTIAL/PLATELET
Abs Immature Granulocytes: 0.06 10*3/uL (ref 0.00–0.07)
Basophils Absolute: 0 10*3/uL (ref 0.0–0.1)
Basophils Relative: 0 %
Eosinophils Absolute: 0 10*3/uL (ref 0.0–0.5)
Eosinophils Relative: 0 %
HCT: 41.1 % (ref 39.0–52.0)
Hemoglobin: 13.4 g/dL (ref 13.0–17.0)
Immature Granulocytes: 1 %
Lymphocytes Relative: 8 %
Lymphs Abs: 0.8 10*3/uL (ref 0.7–4.0)
MCH: 29.6 pg (ref 26.0–34.0)
MCHC: 32.6 g/dL (ref 30.0–36.0)
MCV: 90.9 fL (ref 80.0–100.0)
Monocytes Absolute: 0.8 10*3/uL (ref 0.1–1.0)
Monocytes Relative: 8 %
Neutro Abs: 8.6 10*3/uL — ABNORMAL HIGH (ref 1.7–7.7)
Neutrophils Relative %: 83 %
Platelets: 294 10*3/uL (ref 150–400)
RBC: 4.52 MIL/uL (ref 4.22–5.81)
RDW: 14.6 % (ref 11.5–15.5)
WBC: 10.3 10*3/uL (ref 4.0–10.5)
nRBC: 0 % (ref 0.0–0.2)

## 2021-08-05 LAB — RESP PANEL BY RT-PCR (FLU A&B, COVID) ARPGX2
Influenza A by PCR: NEGATIVE
Influenza B by PCR: NEGATIVE
SARS Coronavirus 2 by RT PCR: NEGATIVE

## 2021-08-05 LAB — BASIC METABOLIC PANEL
Anion gap: 12 (ref 5–15)
BUN: 15 mg/dL (ref 8–23)
CO2: 26 mmol/L (ref 22–32)
Calcium: 8.6 mg/dL — ABNORMAL LOW (ref 8.9–10.3)
Chloride: 99 mmol/L (ref 98–111)
Creatinine, Ser: 1.43 mg/dL — ABNORMAL HIGH (ref 0.61–1.24)
GFR, Estimated: 50 mL/min — ABNORMAL LOW (ref >60)
Glucose, Bld: 175 mg/dL — ABNORMAL HIGH (ref 70–99)
Potassium: 3.2 mmol/L — ABNORMAL LOW (ref 3.5–5.1)
Sodium: 137 mmol/L (ref 135–145)

## 2021-08-05 LAB — BRAIN NATRIURETIC PEPTIDE: B Natriuretic Peptide: >4500 pg/mL — ABNORMAL HIGH (ref 0.0–100.0)

## 2021-08-05 LAB — TROPONIN I (HIGH SENSITIVITY)
Troponin I (High Sensitivity): 101 ng/L (ref <18)
Troponin I (High Sensitivity): 91 ng/L — ABNORMAL HIGH (ref <18)

## 2021-08-05 MED ORDER — POTASSIUM CHLORIDE CRYS ER 20 MEQ PO TBCR
40.0000 meq | EXTENDED_RELEASE_TABLET | Freq: Once | ORAL | Status: AC
  Administered 2021-08-05: 40 meq via ORAL

## 2021-08-05 MED ORDER — POTASSIUM CHLORIDE CRYS ER 20 MEQ PO TBCR
40.0000 meq | EXTENDED_RELEASE_TABLET | Freq: Once | ORAL | Status: DC
  Filled 2021-08-05: qty 2

## 2021-08-05 MED ORDER — METOPROLOL TARTRATE 25 MG PO TABS
25.0000 mg | ORAL_TABLET | Freq: Two times a day (BID) | ORAL | Status: DC
  Administered 2021-08-05 – 2021-08-09 (×8): 25 mg via ORAL
  Filled 2021-08-05 (×8): qty 1

## 2021-08-05 MED ORDER — APIXABAN 5 MG PO TABS
5.0000 mg | ORAL_TABLET | Freq: Two times a day (BID) | ORAL | Status: DC
  Administered 2021-08-05 – 2021-08-09 (×8): 5 mg via ORAL
  Filled 2021-08-05 (×8): qty 1

## 2021-08-05 MED ORDER — ONDANSETRON HCL 4 MG/2ML IJ SOLN: 4.0000 mg | Freq: Four times a day (QID) | INTRAMUSCULAR | Status: DC | PRN

## 2021-08-05 MED ORDER — SPIRONOLACTONE 12.5 MG HALF TABLET
12.5000 mg | ORAL_TABLET | Freq: Every day | ORAL | Status: DC
  Administered 2021-08-06: 12.5 mg via ORAL
  Filled 2021-08-05 (×2): qty 1

## 2021-08-05 MED ORDER — ISOSORBIDE MONONITRATE ER 60 MG PO TB24
60.0000 mg | ORAL_TABLET | Freq: Every day | ORAL | Status: DC
  Administered 2021-08-06: 60 mg via ORAL
  Filled 2021-08-05: qty 1

## 2021-08-05 MED ORDER — FUROSEMIDE 10 MG/ML IJ SOLN
40.0000 mg | Freq: Once | INTRAMUSCULAR | Status: AC
  Administered 2021-08-05: 40 mg via INTRAVENOUS
  Filled 2021-08-05: qty 4

## 2021-08-05 MED ORDER — SODIUM CHLORIDE 0.9% FLUSH
3.0000 mL | INTRAVENOUS | Status: DC | PRN
  Administered 2021-08-05: 3 mL via INTRAVENOUS

## 2021-08-05 MED ORDER — MAGNESIUM SULFATE 2 GM/50ML IV SOLN
2.0000 g | Freq: Once | INTRAVENOUS | Status: AC
  Administered 2021-08-05: 2 g via INTRAVENOUS
  Filled 2021-08-05: qty 50

## 2021-08-05 MED ORDER — CLOPIDOGREL BISULFATE 75 MG PO TABS
75.0000 mg | ORAL_TABLET | Freq: Every day | ORAL | Status: DC
  Administered 2021-08-06 – 2021-08-09 (×4): 75 mg via ORAL
  Filled 2021-08-05 (×4): qty 1

## 2021-08-05 MED ORDER — ACETAMINOPHEN 325 MG PO TABS: 650.0000 mg | ORAL_TABLET | ORAL | Status: DC | PRN

## 2021-08-05 MED ORDER — SACUBITRIL-VALSARTAN 49-51 MG PO TABS
1.0000 | ORAL_TABLET | Freq: Two times a day (BID) | ORAL | Status: DC
  Administered 2021-08-05 – 2021-08-09 (×8): 1 via ORAL
  Filled 2021-08-05 (×8): qty 1

## 2021-08-05 MED ORDER — PANTOPRAZOLE SODIUM 40 MG PO TBEC
40.0000 mg | DELAYED_RELEASE_TABLET | Freq: Every day | ORAL | Status: DC
  Administered 2021-08-06 – 2021-08-09 (×4): 40 mg via ORAL
  Filled 2021-08-05 (×4): qty 1

## 2021-08-05 MED ORDER — DAPAGLIFLOZIN PROPANEDIOL 10 MG PO TABS
10.0000 mg | ORAL_TABLET | Freq: Every day | ORAL | Status: DC
  Administered 2021-08-06 – 2021-08-09 (×4): 10 mg via ORAL
  Filled 2021-08-05 (×4): qty 1

## 2021-08-05 MED ORDER — SODIUM CHLORIDE 0.9 % IV SOLN: 250.0000 mL | INTRAVENOUS | Status: DC | PRN

## 2021-08-05 MED ORDER — ATORVASTATIN CALCIUM 40 MG PO TABS
40.0000 mg | ORAL_TABLET | Freq: Every day | ORAL | Status: DC
  Administered 2021-08-05 – 2021-08-08 (×4): 40 mg via ORAL
  Filled 2021-08-05 (×4): qty 1

## 2021-08-05 MED ORDER — FUROSEMIDE 10 MG/ML IJ SOLN
40.0000 mg | Freq: Every day | INTRAMUSCULAR | Status: DC
  Administered 2021-08-06: 40 mg via INTRAVENOUS
  Filled 2021-08-05: qty 4

## 2021-08-05 MED ORDER — SODIUM CHLORIDE 0.9% FLUSH
3.0000 mL | Freq: Two times a day (BID) | INTRAVENOUS | Status: DC
  Administered 2021-08-06 – 2021-08-09 (×7): 3 mL via INTRAVENOUS

## 2021-08-05 MED ORDER — GLIPIZIDE 5 MG PO TABS
5.0000 mg | ORAL_TABLET | Freq: Every day | ORAL | Status: DC
  Administered 2021-08-06: 5 mg via ORAL
  Filled 2021-08-05 (×2): qty 1

## 2021-08-05 MED ORDER — INSULIN ASPART 100 UNIT/ML IJ SOLN
0.0000 [IU] | Freq: Three times a day (TID) | INTRAMUSCULAR | Status: DC
  Administered 2021-08-07: 2 [IU] via SUBCUTANEOUS
  Administered 2021-08-08 – 2021-08-09 (×3): 1 [IU] via SUBCUTANEOUS

## 2021-08-05 MED ORDER — ESCITALOPRAM OXALATE 10 MG PO TABS
20.0000 mg | ORAL_TABLET | Freq: Every day | ORAL | Status: DC
  Administered 2021-08-07 – 2021-08-09 (×3): 20 mg via ORAL
  Filled 2021-08-05 (×4): qty 2

## 2021-08-05 NOTE — ED Triage Notes (Signed)
Pt here via EMS with c/o SOB X3 days. History of CHF. New bi-lateral pitting edema, bilateral rales lower lobes. 91%RA. Placed on 4 liters with increase to 95%. EMS gave nitro X2. Denies CP.  160/72 RR 20 HR 86 CBG 255

## 2021-08-05 NOTE — H&P (Signed)
History and Physical    Albert Powell [id number] DOB: 03-Nov-1940 DOA: 08/05/2021  PCP: Leeroy Cha, MD (Confirm with patient/family/NH records and if not entered, this has to be entered at [facility] point of entry) Patient coming from: Home  I have personally briefly reviewed patient's old medical records in [place]  Chief Complaint: SOB, leg swelling  HPI: Albert Powell is a 80 y.o. male with medical history significant of PAF on Eliquis, CAD on Plavix, chronic diastolic CHF on Entresto, IIDM, CKD stage IIIb, came with increasing shortness of breath and leg swelling.  Patient reported that he takes as needed Lasix for leg swelling and weight gaining, however scale at home but down recently and he has not been able to check his weight for several weeks.  Recently for last 4 to 5 days developed exertional dyspnea, no cough no fever chills no chest pains.  He took 20 mg of Lasix on Friday and Saturday, however he did not find significant improvement of shortness of breath.  ED Course: Blood pressure stable, no tachycardia afebrile.  Chest x-ray showed bilateral pleural effusion and lung congestion, 40 mg Lasix given patient started to feel Improvement of breathing.  Labs, creatinine 1.4 compared to baseline 1.8-2.1, K3.2, WBC 10.3  Review of Systems: As per HPI otherwise 14 point review of systems negative.    Past Medical History:  Diagnosis Date   Atrial fibrillation ([place]) 03/30/2020   Bradycardia, sinus 02/18/2013   CAD (coronary artery disease)    stent to RCA 2003 also had 40% lesion at that time; NUCLEAR STRESS TEST, 01/16/2010 - normal  now with cath 80-90% stenosis in LAD, will try medical therapy if no improvement PCI   Carotid artery disease ([name])    Cataract    CHF (congestive heart failure) ([place])    Claudication ([place])    LEA DUPLEX, 07/07/2008 - Normal   COPD (chronic obstructive pulmonary disease) ([place])    DDD (degenerative disc disease) 2008    Diabetes mellitus    GERD (gastroesophageal reflux disease)    H/O hiatal hernia    History of colonoscopy 2004   finding of tics and AMV only no polpys   Hyperlipidemia 02/05/2013   Hypertension    Onychomycosis    Rosacea    Tobacco abuse 02/05/2013    Past Surgical History:  Procedure Laterality Date   ABDOMINAL AORTOGRAM W/LOWER EXTREMITY N/A 03/30/2018   Procedure: ABDOMINAL AORTOGRAM W/LOWER EXTREMITY;  Surgeon: Serafina Mitchell, MD;  Location: [facility];  Service: Cardiovascular;  Laterality: N/A;   ABDOMINAL AORTOGRAM W/LOWER EXTREMITY Bilateral 08/27/2020   Procedure: ABDOMINAL AORTOGRAM W/LOWER EXTREMITY;  Surgeon: Lorretta Harp, MD;  Location: [facility];  Service: Cardiovascular;  Laterality: Bilateral;   APPENDECTOMY     CARDIAC CATHETERIZATION  08/29/2002   2-vessel CAD with high-grade stenoses in RCA; stenting to RCA   CARDIAC CATHETERIZATION  2014   80-90% lesion will try medical therapy   CARDIAC SURGERY     stents  2003   CARDIOVERSION N/A 11/01/2013   Procedure: [place] COMPRESSION;  Surgeon: Lorretta Harp, MD;  Location: [facility] CATH LAB;  Service: Cardiovascular;  Laterality: N/A;   CAROTID ANGIOGRAM N/A 04/25/2013   Procedure: CAROTID ANGIOGRAM;  Surgeon: Lorretta Harp, MD;  Location: [facility] CATH LAB;  Service: Cardiovascular;  Laterality: N/A;   CAROTID ENDARTERECTOMY     COLON RESECTION  3/04, 6/04    1 bleeding diverticulitis, 2 complete colectomy  COLON SURGERY  2005   renal pouch rectal anastimosis   CORONARY ANGIOPLASTY WITH STENT PLACEMENT  09/2002   2 stents to RCA   ENDARTERECTOMY Left 06/02/2013   Procedure: ENDARTERECTOMY CAROTID-LEFT;  Surgeon: Nada Libman, MD;  Location: [facility] OR;  Service: Vascular;  Laterality: Left;   INTRAVASCULAR PRESSURE WIRE/FFR STUDY N/A 04/02/2020   Procedure: INTRAVASCULAR PRESSURE WIRE/FFR STUDY;  Surgeon: [name], MD;  Location: [facility];  Service: Cardiovascular;   Laterality: N/A;  DFR - RCA   LEFT HEART CATH AND CORONARY ANGIOGRAPHY N/A 04/02/2020   Procedure: LEFT HEART CATH AND CORONARY ANGIOGRAPHY;  Surgeon: [name], MD;  Location: [facility];  Service: Cardiovascular;  Laterality: N/A;   LEFT HEART CATHETERIZATION WITH CORONARY ANGIOGRAM N/A 02/08/2013   Procedure: LEFT HEART CATHETERIZATION WITH CORONARY ANGIOGRAM;  Surgeon: [name], MD;  Location: [facility] CATH LAB;  Service: Cardiovascular;  Laterality: N/A;   LOWER EXTREMITY ANGIOGRAM N/A 10/20/2013   Procedure: LOWER EXTREMITY ANGIOGRAM;  Surgeon: [name], MD;  Location: [facility] CATH LAB;  Service: Cardiovascular;  Laterality: N/A;   PATCH ANGIOPLASTY Left 06/02/2013   Procedure: PATCH ANGIOPLASTY;  Surgeon: Nada Libman, MD;  Location: [facility] OR;  Service: Vascular;  Laterality: Left;   PERIPHERAL VASCULAR INTERVENTION Bilateral 03/30/2018   Procedure: PERIPHERAL VASCULAR INTERVENTION;  Surgeon: Nada Libman, MD;  Location: [facility];  Service: Cardiovascular;  Laterality: Bilateral;  common iliacs   TONSILLECTOMY       reports that he quit smoking about 8 years ago. His smoking use included cigarettes. He has a 57.00 pack-year smoking history. He has never used smokeless tobacco. He reports current alcohol use of about 1.0 standard drink per week. He reports that he does not use drugs.  Allergies  Allergen Reactions   Fentanyl Other (See Comments)    Behavioral changes   Gabapentin Other (See Comments)    ankle swells   Lisinopril Other (See Comments)    unknown   Lyrica [Pregabalin] Other (See Comments)    unknown   Metformin Diarrhea   Propofol Other (See Comments)    Heart rate dropped    Family History  Problem Relation Age of Onset   Heart disease Father        heart attack at 49   Heart attack Father    Hyperlipidemia Father    Colonic polyp Mother        that bled out   COPD Mother    Diabetes Mother    Heart disease Sister    Colonic  polyp Sister    Stroke Maternal Grandfather    Heart attack Paternal Grandfather    Other Neg Hx        hypogonadism     Prior to Admission medications   Medication Sig Start Date End Date Taking? Authorizing Provider  apixaban (ELIQUIS) 5 MG TABS tablet Take 1 tablet (5 mg total) by mouth 2 (two) times daily. 03/21/21   Angelita Ingles, MD  atorvastatin (LIPITOR) 40 MG tablet Take 40 mg by mouth at bedtime.     [provider]  clopidogrel (PLAVIX) 75 MG tablet Take 1 tablet (75 mg total) by mouth daily. 01/04/21   Azucena Fallen, MD  dapagliflozin propanediol (FARXIGA) 10 MG TABS tablet Take 1 tablet (10 mg total) by mouth daily before breakfast. 04/25/21   Angelita Ingles, MD  escitalopram (LEXAPRO) 20 MG tablet Take 20 mg by mouth daily.  01/18/21   [provider]  furosemide (LASIX) 20 MG tablet Take 1 tablet (20 mg total) by mouth daily. 06/10/21   Charlynne Cousins, MD  glipiZIDE (GLUCOTROL) 5 MG tablet Take 5 mg by mouth daily before breakfast.    [provider]  isosorbide mononitrate (IMDUR) 60 MG 24 hr tablet Take 1 tablet (60 mg total) by mouth daily. 01/04/21   Little Ishikawa, MD  metoprolol tartrate (LOPRESSOR) 25 MG tablet Take 1 tablet (25 mg total) by mouth 2 (two) times daily. 01/03/21   Little Ishikawa, MD  OZEMPIC, 1 MG/DOSE, 2 MG/1.5ML SOPN Inject 2.5 mg into the skin once a week.  03/27/20   [provider]  pantoprazole (PROTONIX) 40 MG tablet Take 40 mg by mouth daily.     [provider]  Polyethyl Glycol-Propyl Glycol (SYSTANE FREE OP) Place 1 drop into both eyes daily as needed (dry eyes).    [provider]  sacubitril-valsartan (ENTRESTO) 49-51 MG Take 1 tablet by mouth 2 (two) times daily. 05/09/21   Bensimhon, Shaune Pascal, MD  spironolactone (ALDACTONE) 25 MG tablet Take 0.5 tablets (12.5 mg total) by mouth daily. 04/25/21 07/24/21  Katherine Roan, MD    Physical Exam: Vitals:   08/05/21  1529 08/05/21 1704 08/05/21 1745  BP: (!) 132/91 111/64 129/62  Pulse: 88 68 72  Resp: 18 16 (!) 21  Temp: 98.5 F (36.9 C) 97.7 F (36.5 C)   TempSrc:  Oral   SpO2: 98% 100% 97%    Constitutional: NAD, calm, comfortable Vitals:   08/05/21 1529 08/05/21 1704 08/05/21 1745  BP: (!) 132/91 111/64 129/62  Pulse: 88 68 72  Resp: 18 16 (!) 21  Temp: 98.5 F (36.9 C) 97.7 F (36.5 C)   TempSrc:  Oral   SpO2: 98% 100% 97%   Eyes: PERRL, lids and conjunctivae normal ENMT: Mucous membranes are moist. Posterior pharynx clear of any exudate or lesions.Normal dentition.  Neck: normal, supple, no masses, no thyromegaly Respiratory: Diminished breathing sound on bilateral lower field with fine crackles.  Increasing respiratory effort. No accessory muscle use.  Cardiovascular: Regular rate and rhythm, no murmurs / rubs / gallops. 2+ extremity edema. 2+ pedal pulses. No carotid bruits.  Abdomen: no tenderness, no masses palpated. No hepatosplenomegaly. Bowel sounds positive.  Musculoskeletal: no clubbing / cyanosis. No joint deformity upper and lower extremities. Good ROM, no contractures. Normal muscle tone.  Skin: no rashes, lesions, ulcers. No induration Neurologic: CN 2-12 grossly intact. Sensation intact, DTR normal. Strength 5/5 in all 4.  Psychiatric: Normal judgment and insight. Alert and oriented x 3. Normal mood.    Labs on Admission: I have personally reviewed following labs and imaging studies  CBC: Recent Labs  Lab 08/05/21 1544  WBC 10.3  NEUTROABS 8.6*  HGB 13.4  HCT 41.1  MCV 90.9  PLT [id number]   Basic Metabolic Panel: Recent Labs  Lab 08/05/21 1544  NA 137  K 3.2*  CL 99  CO2 26  GLUCOSE 175*  BUN 15  CREATININE 1.43*  CALCIUM 8.6*   GFR: CrCl cannot be calculated (Unknown ideal weight.). Liver Function Tests: No results for input(s): AST, ALT, ALKPHOS, BILITOT, PROT, ALBUMIN in the last 168 hours. No results for input(s): LIPASE, AMYLASE in the last 168  hours. No results for input(s): AMMONIA in the last 168 hours. Coagulation Profile: No results for input(s): INR, PROTIME in the last 168 hours. Cardiac Enzymes: No results for input(s): CKTOTAL, CKMB, CKMBINDEX,  TROPONINI in the last 168 hours. BNP (last 3 results) No results for input(s): PROBNP in the last 8760 hours. HbA1C: No results for input(s): HGBA1C in the last 72 hours. CBG: No results for input(s): GLUCAP in the last 168 hours. Lipid Profile: No results for input(s): CHOL, HDL, LDLCALC, TRIG, CHOLHDL, LDLDIRECT in the last 72 hours. Thyroid Function Tests: No results for input(s): TSH, T4TOTAL, FREET4, T3FREE, THYROIDAB in the last 72 hours. Anemia Panel: No results for input(s): VITAMINB12, FOLATE, FERRITIN, TIBC, IRON, RETICCTPCT in the last 72 hours. Urine analysis:    Component Value Date/Time   COLORURINE YELLOW 01/01/2021 1351   APPEARANCEUR CLEAR 01/01/2021 1351   LABSPEC 1.009 01/01/2021 1351   PHURINE 6.0 01/01/2021 1351   GLUCOSEU >=500 (A) 01/01/2021 1351   HGBUR SMALL (A) 01/01/2021 1351   BILIRUBINUR NEGATIVE 01/01/2021 1351   KETONESUR NEGATIVE 01/01/2021 1351   PROTEINUR >=300 (A) 01/01/2021 1351   UROBILINOGEN 1.0 05/31/2013 1432   NITRITE NEGATIVE 01/01/2021 1351   LEUKOCYTESUR NEGATIVE 01/01/2021 1351    Radiological Exams on Admission: DG Chest 2 View  Result Date: 08/05/2021 CLINICAL DATA:  Shortness of breath EXAM: CHEST - 2 VIEW COMPARISON:  Radiograph 06/06/2021 FINDINGS: Unchanged size of the cardiomediastinal silhouette. There is bibasilar airspace disease and small to moderate size bilateral pleural effusions. No visible pneumothorax. Mild central pulmonary vascular congestion. No acute osseous abnormality. Spinal cord stimulator lead overlies the mid chest. IMPRESSION: Small to moderate-sized bilateral pleural effusions with bibasilar airspace disease, favored to be atelectasis. Mild central pulmonary vascular congestion/mild edema.  Electronically Signed   By: Maurine Simmering M.D.   On: 08/05/2021 16:33    EKG: Independently reviewed.  Sinus, frequent PVCs.  Assessment/Plan Principal Problem:   CHF (congestive heart failure) ([place])  (please populate well all problems here in Problem List. (For example, if patient is on BP meds at home and you resume or decide to hold them, it is a problem that needs to be her. Same for CAD, COPD, HLD and so on)  Acute on chronic diastolic CHF decompensation -Symptoms signs of fluid overload and bilateral pleural effusion, likely secondary to noncompliant with Lasix.  Educated the patient about enforcing Lasix for weight gain more than one-point in 2 days or 3 pounds in 2 days, and/or ankle edema, patient expressed understanding and agreed. -Continue spironolactone, Entresto, ACEI. -Continue IV Lasix 40 mg daily -Echo was done less than 2 months ago showing chronic diastolic dysfunction, will not repeat echo at this time.  Frequent PVCs -Replenish K and magnesium -Continue telemetry monitoring.  Hypokalemia -As above  CKD stage III -Fluid overload, chronic exacerbated, continue IV diuresis.  IIDM -Continue Farxiga and glipizide  PAF -In sinus rhythm, continue Eliquis.  CAD -No acute issue, no chest pains, EKG nonischemic, troponin pending. -Continue Plavix and statin.  HTN -Continue Imdur, metoprolol, Entresto, spironolactone  DVT prophylaxis: Eliquis Code Status: DNR as per patient Family Communication: None at bedside Disposition Plan: Expect aggressive diuresis for more than 2 days, expect more than 2 midnight hospital stay. Consults called: None Admission status: Tele admit   Lequita Halt MD [facility] Pager [phone]  08/05/2021, 6:48 PM

## 2021-08-05 NOTE — ED Provider Notes (Signed)
[place] [facility] EMERGENCY DEPARTMENT Provider Note   CSN: [id number] Arrival date & time: 08/05/21  1525     History Chief Complaint  Patient presents with   Shortness of Breath    Albert Powell is a 80 y.o. male with a past medical history of CHF, presenting to the ED brought in by EMS complaining of intermittent shortness of breath onset 3-4 days ago. His shortness of breath is worse with exertion. Patient does not use oxygen at home. He is taking his Rx Lasix PRN. He stopped taking his potassium. He has not tried any medications for his symptoms.  Patient denies chest pain, fever, chills, abdominal pain, nausea, or vomiting. Denies recent immobilization, recent surgery, history of cancer, hormone replacement therapy use.  The history is provided by the patient. No language interpreter was used.  Shortness of Breath Timing:  Intermittent Progression:  Unchanged Chronicity:  New Context: not activity, not URI and not weather changes   Relieved by:  None tried Worsened by:  Exertion Ineffective treatments:  None tried Associated symptoms: no abdominal pain, no chest pain, no cough, no fever, no syncope and no vomiting   Risk factors: no hx of cancer, no hx of PE/DVT, no prolonged immobilization and no recent surgery       Past Medical History:  Diagnosis Date   Atrial fibrillation ([place]) 03/30/2020   Bradycardia, sinus 02/18/2013   CAD (coronary artery disease)    stent to RCA 2003 also had 40% lesion at that time; NUCLEAR STRESS TEST, 01/16/2010 - normal  now with cath 80-90% stenosis in LAD, will try medical therapy if no improvement PCI   Carotid artery disease ([name])    Cataract    CHF (congestive heart failure) ([place])    Claudication ([place])    LEA DUPLEX, 07/07/2008 - Normal   COPD (chronic obstructive pulmonary disease) ([place])    DDD (degenerative disc disease) 2008   Diabetes mellitus    GERD (gastroesophageal reflux disease)    H/O hiatal hernia     History of colonoscopy 2004   finding of tics and AMV only no polpys   Hyperlipidemia 02/05/2013   Hypertension    Onychomycosis    Rosacea    Tobacco abuse 02/05/2013    Patient Active Problem List   Diagnosis Date Noted   Acute diastolic CHF (congestive heart failure) ([place]) [id number]   Acute metabolic encephalopathy    Acute exacerbation of CHF (congestive heart failure) ([place]) 06/06/2021   CHF (congestive heart failure) ([place]) 03/10/2021   Acute hypoxemic respiratory failure ([place]) 03/10/2021   Uncontrolled hypertension 03/10/2021   Hypertensive emergency 01/01/2021   ACS (acute coronary syndrome) ([place])    AKI (acute kidney injury) ([place])    Atrial fibrillation with RVR ([place]) 03/30/2020   Contusion of right hand 11/12/2018   Primary osteoarthritis of both first carpometacarpal joints 11/12/2018   Pain in joint of right shoulder 10/05/2018   Pain in right hand 10/05/2018   Hematoma 03/30/2018   Degeneration of lumbar intervertebral disc 12/21/2017   Lumbar radiculopathy 12/21/2017   Vertigo 04/09/2017   Hypogonadism in male 12/07/2014   Screening for prostate cancer 12/07/2014   Claudication of right lower extremity ([place]) 10/20/2013   Claudication in peripheral vascular disease- Rt leg 09/20/2013   Obesity (BMI 30-39.9)- negative sleep study in the past 09/20/2013   Occlusion and stenosis of carotid artery without mention of cerebral infarction 05/23/2013   Peripheral arterial disease ([place]) 05/18/2013   Carotid artery disease ([place])  04/14/2013   Bradycardia, sinus 02/18/2013   Hyperlipidemia 02/05/2013   Tobacco abuse 02/05/2013   Chest pain, unstable angina, negative MI 02/04/2013   Type 2 diabetes mellitus ([name]) 02/04/2013   CAD hx of 2 stents to RCA in 2003,  80-90% stenosis of LAD/CFX June 2014- medical Rx 02/04/2013   Benign essential hypertension 07/10/2011   Incisional hernia 05/22/2011    Past Surgical History:  Procedure Laterality Date   ABDOMINAL AORTOGRAM  W/LOWER EXTREMITY N/A 03/30/2018   Procedure: ABDOMINAL AORTOGRAM W/LOWER EXTREMITY;  Surgeon: Nada Libman, MD;  Location: [facility];  Service: Cardiovascular;  Laterality: N/A;   ABDOMINAL AORTOGRAM W/LOWER EXTREMITY Bilateral 08/27/2020   Procedure: ABDOMINAL AORTOGRAM W/LOWER EXTREMITY;  Surgeon: [name], MD;  Location: [facility];  Service: Cardiovascular;  Laterality: Bilateral;   APPENDECTOMY     CARDIAC CATHETERIZATION  08/29/2002   2-vessel CAD with high-grade stenoses in RCA; stenting to RCA   CARDIAC CATHETERIZATION  2014   80-90% lesion will try medical therapy   CARDIAC SURGERY     stents  2003   CARDIOVERSION N/A 11/01/2013   Procedure: Dim[place] COMPRESSION;  Surgeon: [name], MD;  Location: [facility] CATH LAB;  Service: Cardiovascular;  Laterality: N/A;   CAROTID ANGIOGRAM N/A 04/25/2013   Procedure: CAROTID ANGIOGRAM;  Surgeon: [name], MD;  Location: [facility] CATH LAB;  Service: Cardiovascular;  Laterality: N/A;   CAROTID ENDARTERECTOMY     COLON RESECTION  3/04, 6/04    1 bleeding diverticulitis, 2 complete colectomy   COLON SURGERY  2005   renal pouch rectal anastimosis   CORONARY ANGIOPLASTY WITH STENT PLACEMENT  09/2002   2 stents to RCA   ENDARTERECTOMY Left 06/02/2013   Procedure: ENDARTERECTOMY CAROTID-LEFT;  Surgeon: Nada Libman, MD;  Location: [facility] OR;  Service: Vascular;  Laterality: Left;   INTRAVASCULAR PRESSURE WIRE/FFR STUDY N/A 04/02/2020   Procedure: INTRAVASCULAR PRESSURE WIRE/FFR STUDY;  Surgeon: [name], MD;  Location: [facility];  Service: Cardiovascular;  Laterality: N/A;  DFR - RCA   LEFT HEART CATH AND CORONARY ANGIOGRAPHY N/A 04/02/2020   Procedure: LEFT HEART CATH AND CORONARY ANGIOGRAPHY;  Surgeon: [name], MD;  Location: [facility];  Service: Cardiovascular;  Laterality: N/A;   LEFT HEART CATHETERIZATION WITH CORONARY ANGIOGRAM N/A 02/08/2013   Procedure: LEFT HEART  CATHETERIZATION WITH CORONARY ANGIOGRAM;  Surgeon: [name], MD;  Location[facility] LAB;  Service: Cardiovascular;  Laterality: N/A;   LOWER EXTREMITY ANGIOGRAM N/A 10/20/2013   Procedure: LOWER EXTREMITY ANGIOGRAM;  Surgeon: [name], MD;  Location: [facility] CATH LAB;  Service: Cardiovascular;  Laterality: N/A;   PATCH ANGIOPLASTY Left 06/02/2013   Procedure: PATCH ANGIOPLASTY;  Surgeon: Nada Libman, MD;  Location: [facility] OR;  Service: Vascular;  Laterality: Left;   PERIPHERAL VASCULAR INTERVENTION Bilateral 03/30/2018   Procedure: PERIPHERAL VASCULAR INTERVENTION;  Surgeon: Nada Libman, MD;  Location: [facility];  Service: Cardiovascular;  Laterality: Bilateral;  common iliacs   TONSILLECTOMY         Family History  Problem Relation Age of Onset   Heart disease Father        heart attack at 63   Heart attack Father    Hyperlipidemia Father    Colonic polyp Mother        that bled out   COPD Mother    Diabetes Mother    Heart disease Sister  Colonic polyp Sister    Stroke Maternal Grandfather    Heart attack Paternal Grandfather    Other Neg Hx        hypogonadism    Social History   Tobacco Use   Smoking status: Former    Packs/day: 1.00    Years: 57.00    Pack years: 57.00    Types: Cigarettes    Quit date: 04/06/2013    Years since quitting: 8.3   Smokeless tobacco: Never   Tobacco comments:    pt states that he is using the vapor cigs  Vaping Use   Vaping Use: Every day   Start date: 09/15/2016   Substances: Nicotine  Substance Use Topics   Alcohol use: Yes    Alcohol/week: 1.0 standard drink    Types: 1 Glasses of wine per week    Comment: "very little"   Drug use: No    Home Medications Prior to Admission medications   Medication Sig Start Date End Date Taking? Authorizing Provider  apixaban (ELIQUIS) 5 MG TABS tablet Take 1 tablet (5 mg total) by mouth 2 (two) times daily. 03/21/21   Angelita Ingles, MD  atorvastatin (LIPITOR) 40  MG tablet Take 40 mg by mouth at bedtime.     [provider]  clopidogrel (PLAVIX) 75 MG tablet Take 1 tablet (75 mg total) by mouth daily. 01/04/21   Azucena Fallen, MD  dapagliflozin propanediol (FARXIGA) 10 MG TABS tablet Take 1 tablet (10 mg total) by mouth daily before breakfast. 04/25/21   Angelita Ingles, MD  escitalopram (LEXAPRO) 20 MG tablet Take 20 mg by mouth daily. 01/18/21   [provider]  furosemide (LASIX) 20 MG tablet Take 1 tablet (20 mg total) by mouth daily. 06/10/21   Marinda Elk, MD  glipiZIDE (GLUCOTROL) 5 MG tablet Take 5 mg by mouth daily before breakfast.    [provider]  isosorbide mononitrate (IMDUR) 60 MG 24 hr tablet Take 1 tablet (60 mg total) by mouth daily. 01/04/21   Azucena Fallen, MD  metoprolol tartrate (LOPRESSOR) 25 MG tablet Take 1 tablet (25 mg total) by mouth 2 (two) times daily. 01/03/21   Azucena Fallen, MD  OZEMPIC, 1 MG/DOSE, 2 MG/1.5ML SOPN Inject 2.5 mg into the skin once a week.  03/27/20   [provider]  pantoprazole (PROTONIX) 40 MG tablet Take 40 mg by mouth daily.     [provider]  Polyethyl Glycol-Propyl Glycol (SYSTANE FREE OP) Place 1 drop into both eyes daily as needed (dry eyes).    [provider]  sacubitril-valsartan (ENTRESTO) 49-51 MG Take 1 tablet by mouth 2 (two) times daily. 05/09/21   Bensimhon, Bevelyn Buckles, MD  spironolactone (ALDACTONE) 25 MG tablet Take 0.5 tablets (12.5 mg total) by mouth daily. 04/25/21 07/24/21  Angelita Ingles, MD    Allergies    Fentanyl, Gabapentin, Lisinopril, Lyrica [pregabalin], Metformin, and Propofol  Review of Systems   Review of Systems  Constitutional:  Negative for chills and fever.  Respiratory:  Positive for shortness of breath. Negative for cough.   Cardiovascular:  Negative for chest pain and syncope.  Gastrointestinal:  Negative for abdominal pain and vomiting.  All other systems reviewed and are  negative.  Physical Exam Updated Vital Signs BP 111/64 (BP Location: Left Arm)   Pulse 68   Temp 97.7 F (36.5 C) (Oral)   Resp 16   SpO2 100%   Physical Exam Vitals and nursing note  reviewed.  Constitutional:      General: He is not in acute distress.    Appearance: He is not diaphoretic.     Comments: Nasal cannula in place.  HENT:     Head: Normocephalic and atraumatic.     Mouth/Throat:     Pharynx: No oropharyngeal exudate.  Eyes:     General: No scleral icterus.    Conjunctiva/sclera: Conjunctivae normal.  Cardiovascular:     Rate and Rhythm: Normal rate and regular rhythm.     Pulses: Normal pulses.          Radial pulses are 2+ on the right side and 2+ on the left side.     Heart sounds: Normal heart sounds.  Pulmonary:     Effort: Pulmonary effort is normal. No respiratory distress.     Breath sounds: Normal breath sounds. No wheezing.  Abdominal:     General: Bowel sounds are normal.     Palpations: Abdomen is soft. There is no mass.     Tenderness: There is no abdominal tenderness. There is no guarding or rebound.  Musculoskeletal:        General: Normal range of motion.     Cervical back: Normal range of motion and neck supple.     Right lower leg: Edema present.     Left lower leg: Edema present.     Comments: Bilateral 1+ pitting edema.  Strength and sensation intact to bilateral upper and lower extremities.  Skin:    General: Skin is warm and dry.  Neurological:     Mental Status: He is alert.  Psychiatric:        Behavior: Behavior normal.    ED Results / Procedures / Treatments   Labs (all labs ordered are listed, but only abnormal results are displayed) Labs Reviewed  BASIC METABOLIC PANEL - Abnormal; Notable for the following components:      Result Value   Potassium 3.2 (*)    Glucose, Bld 175 (*)    Creatinine, Ser 1.43 (*)    Calcium 8.6 (*)    GFR, Estimated 50 (*)    All other components within normal limits  CBC WITH  DIFFERENTIAL/PLATELET - Abnormal; Notable for the following components:   Neutro Abs 8.6 (*)    All other components within normal limits  BRAIN NATRIURETIC PEPTIDE - Abnormal; Notable for the following components:   B Natriuretic Peptide >4,500.0 (*)    All other components within normal limits  RESP PANEL BY RT-PCR (FLU A&B, COVID) [id number]  BASIC METABOLIC PANEL  TROPONIN I (HIGH SENSITIVITY)  TROPONIN I (HIGH SENSITIVITY)    EKG EKG Interpretation  Date/Time:  Monday August 05 2021 15:31:54 EST Ventricular Rate:  74 PR Interval:  224 QRS Duration: 68 QT Interval:  418 QTC Calculation: 463 R Axis:   164 Text Interpretation: Sinus rhythm with 1st degree A-V block with Premature atrial complexes with Abberant conduction Left posterior fascicular block Anterior infarct , age undetermined Abnormal ECG Confirmed by Regan Lemming ([id number]) on 08/05/2021 5:24:58 PM  Radiology DG Chest 2 View  Result Date: 08/05/2021 CLINICAL DATA:  Shortness of breath EXAM: CHEST - 2 VIEW COMPARISON:  Radiograph 06/06/2021 FINDINGS: Unchanged size of the cardiomediastinal silhouette. There is bibasilar airspace disease and small to moderate size bilateral pleural effusions. No visible pneumothorax. Mild central pulmonary vascular congestion. No acute osseous abnormality. Spinal cord stimulator lead overlies the mid chest. IMPRESSION: Small to moderate-sized bilateral pleural effusions with bibasilar airspace disease, favored to  be atelectasis. Mild central pulmonary vascular congestion/mild edema. Electronically Signed   By: Maurine Simmering M.D.   On: 08/05/2021 16:33    Procedures Procedures   Medications Ordered in ED Medications  magnesium sulfate IVPB 2 g 50 mL (has no administration in time range)  potassium chloride SA (KLOR-CON) CR tablet 40 mEq (has no administration in time range)  atorvastatin (LIPITOR) tablet 40 mg (has no administration in time range)  furosemide (LASIX) injection 40 mg (has no  administration in time range)  isosorbide mononitrate (IMDUR) 24 hr tablet 60 mg (has no administration in time range)  metoprolol tartrate (LOPRESSOR) tablet 25 mg (has no administration in time range)  sacubitril-valsartan (ENTRESTO) 49-51 mg per tablet (has no administration in time range)  spironolactone (ALDACTONE) tablet 12.5 mg (has no administration in time range)  escitalopram (LEXAPRO) tablet 20 mg (has no administration in time range)  dapagliflozin propanediol (FARXIGA) tablet 10 mg (has no administration in time range)  glipiZIDE (GLUCOTROL) tablet 5 mg (has no administration in time range)  pantoprazole (PROTONIX) EC tablet 40 mg (has no administration in time range)  apixaban (ELIQUIS) tablet 5 mg (has no administration in time range)  clopidogrel (PLAVIX) tablet 75 mg (has no administration in time range)  sodium chloride flush (NS) 0.9 % injection 3 mL (has no administration in time range)  sodium chloride flush (NS) 0.9 % injection 3 mL (has no administration in time range)  0.9 %  sodium chloride infusion (has no administration in time range)  acetaminophen (TYLENOL) tablet 650 mg (has no administration in time range)  ondansetron (ZOFRAN) injection 4 mg (has no administration in time range)  insulin aspart (novoLOG) injection 0-9 Units (has no administration in time range)  potassium chloride SA (KLOR-CON) CR tablet 40 mEq (has no administration in time range)  furosemide (LASIX) injection 40 mg (40 mg Intravenous Given 08/05/21 1853)    ED Course  I have reviewed the triage vital signs and the nursing notes.  Pertinent labs & imaging results that were available during my care of the patient were reviewed by me and considered in my medical decision making (see chart for details).  Clinical Course as of 08/05/21 1857  Mon Aug 05, 2021  1742 Patient O2 sats at 92-93% on RA. Discussed with Attending. Will re-evaluate.  [SB]  1811 Re-evaluated patient, patient at 92-93%  on RA, however, having trouble breathing. Patient placed on 2 L O2 via [place] [SB]  1821 Attending evaluated patient. Patient O2 at 97% on 2 L O2 via . Attending agrees with admission plan. Will consult.  [SB]  [place], PA-C consult with Hospitalist, Dr. Roosevelt Locks who will admit the patient for CHF exacerbation. Patient agreeable. [SB]  1846 Discussed with patient admission plan, patient agreeable at this time. [SB]    Clinical Course User Index [SB] Lundon Verdejo A, PA-C   MDM Rules/Calculators/A&P                         Patient presents to the ED with 3-4 days of shortness of breath.  Patient with a past medical history of CHF.  EMS placed patient on 2 L of oxygen via nasal cannula due to hypoxia.  Differential diagnosis includes CHF exacerbation, PE, PNA. Patient does not wear oxygen at home. New oxygen requirement today. On exam patient with diminished lung sounds, otherwise cardiovascular, respiratory, abdominal exam without acute findings.  Prior to my evaluation, pt was placed on 4  L of oxygen via nasal cannula. No documented hypoxia at that time prior to 4 L oxygen being placed.   CBC without acute findings.  BMP notable for potassium decreased at 3.2.  Creatinine improved from previous values.  Potassium repleted in the ED.  Magnesium given in the ED.  Given 40 mg Lasix.  Troponin and COVID/flu swab ordered with results pending. This patient case with attending, attending agrees with treatment plan.  Less likely PNA, CXR without focal consolidation. Less likely PE, patient not tachycardic and no risk factors. Ambulatory pulse oximetry noted patient O2 fluctuating from 92-93%. Patient asymptomatic. Re-evaluation noted patient having trouble breathing, placed on 2 L O2 via [place] with improvement of his symptoms.  BNP elevated at >4,5000, significantly higher than previous value of 1349.7 2 months ago. This is likely CHF exacerbation.   Patient reevaluated prior to consult with hospitalist and  vital signs stable, no acute distress, patient resting comfortably on stretcher.  Consult with Hospitalist, Dr. Chipper Herb has been consulted and will admit the patient for CHF exacerbation.   This case was discussed with Dr. Karene Fry who has seen the patient and agrees with plan to admit.   Final Clinical Impression(s) / ED Diagnoses Final diagnoses:  Acute on chronic congestive heart failure, unspecified heart failure type [facility])  CHF (congestive heart failure) [facility])    Rx / DC Orders ED Discharge Orders     None        Mylinh Cragg A, PA-C 08/05/21 1857    Ernie Avena, MD 08/05/21 [phone]

## 2021-08-05 NOTE — ED Provider Notes (Signed)
Emergency Medicine Provider Triage Evaluation Note  [name] , a 80 y.o. male  was evaluated in triage.  Pt complains of shortness of breath.  Started 2 or 3 days ago, history of heart failure.  Is also having bilateral edema, does not have oxygen requirement at home.  He is on 2 L of oxygen by EMS due to hypoxia.  Review of Systems  Positive: Shortness of breath Negative: Chest pain  Physical Exam  BP (!) 132/91   Pulse 88   Temp 98.5 F (36.9 C)   Resp 18   SpO2 98%  Gen:   Awake, no distress   Resp:  Normal effort Rales bilaterally, hypoxic on room air MSK:   Moves extremities without difficulty  Other:  Bilateral pitting edema  Medical Decision Making  Medically screening exam initiated at 3:42 PM.  Appropriate orders placed.  Albert Powell was informed that the remainder of the evaluation will be completed by another provider, this initial triage assessment does not replace that evaluation, and the importance of remaining in the ED until their evaluation is complete.  Shortness of breath, CHF, new hypoxia prioritized for room   Albert Powell, Albert Powell 08/05/21 1544    Albert Bale, MD 08/05/21 1719

## 2021-08-06 ENCOUNTER — Other Ambulatory Visit: Payer: Self-pay

## 2021-08-06 ENCOUNTER — Inpatient Hospital Stay (HOSPITAL_COMMUNITY): Payer: Medicare Other

## 2021-08-06 ENCOUNTER — Encounter (HOSPITAL_COMMUNITY): Payer: Self-pay | Admitting: Internal Medicine

## 2021-08-06 DIAGNOSIS — I2510 Atherosclerotic heart disease of native coronary artery without angina pectoris: Secondary | ICD-10-CM

## 2021-08-06 DIAGNOSIS — I160 Hypertensive urgency: Secondary | ICD-10-CM

## 2021-08-06 DIAGNOSIS — I5033 Acute on chronic diastolic (congestive) heart failure: Secondary | ICD-10-CM | POA: Diagnosis not present

## 2021-08-06 DIAGNOSIS — I739 Peripheral vascular disease, unspecified: Secondary | ICD-10-CM

## 2021-08-06 DIAGNOSIS — E7800 Pure hypercholesterolemia, unspecified: Secondary | ICD-10-CM

## 2021-08-06 LAB — BASIC METABOLIC PANEL
Anion gap: 11 (ref 5–15)
BUN: 21 mg/dL (ref 8–23)
CO2: 25 mmol/L (ref 22–32)
Calcium: 8.7 mg/dL — ABNORMAL LOW (ref 8.9–10.3)
Chloride: 101 mmol/L (ref 98–111)
Creatinine, Ser: 1.55 mg/dL — ABNORMAL HIGH (ref 0.61–1.24)
GFR, Estimated: 45 mL/min — ABNORMAL LOW (ref >60)
Glucose, Bld: 98 mg/dL (ref 70–99)
Potassium: 3.7 mmol/L (ref 3.5–5.1)
Sodium: 137 mmol/L (ref 135–145)

## 2021-08-06 LAB — GLUCOSE, CAPILLARY
Glucose-Capillary: 104 mg/dL — ABNORMAL HIGH (ref 70–99)
Glucose-Capillary: 103 mg/dL — ABNORMAL HIGH (ref 70–99)
Glucose-Capillary: 160 mg/dL — ABNORMAL HIGH (ref 70–99)

## 2021-08-06 LAB — CBG MONITORING, ED
Glucose-Capillary: 105 mg/dL — ABNORMAL HIGH (ref 70–99)
Glucose-Capillary: 186 mg/dL — ABNORMAL HIGH (ref 70–99)

## 2021-08-06 LAB — MAGNESIUM: Magnesium: 2.4 mg/dL (ref 1.7–2.4)

## 2021-08-06 LAB — MRSA NEXT GEN BY PCR, NASAL: MRSA by PCR Next Gen: NOT DETECTED

## 2021-08-06 MED ORDER — POTASSIUM CHLORIDE CRYS ER 20 MEQ PO TBCR
40.0000 meq | EXTENDED_RELEASE_TABLET | Freq: Every day | ORAL | Status: DC
  Administered 2021-08-07 – 2021-08-09 (×3): 40 meq via ORAL
  Filled 2021-08-06 (×3): qty 2

## 2021-08-06 MED ORDER — FUROSEMIDE 10 MG/ML IJ SOLN
40.0000 mg | Freq: Two times a day (BID) | INTRAMUSCULAR | Status: DC
  Administered 2021-08-06: 40 mg via INTRAVENOUS
  Filled 2021-08-06: qty 4

## 2021-08-06 MED ORDER — SPIRONOLACTONE 25 MG PO TABS
25.0000 mg | ORAL_TABLET | Freq: Every day | ORAL | Status: DC
  Administered 2021-08-07 – 2021-08-09 (×3): 25 mg via ORAL
  Filled 2021-08-06 (×3): qty 1

## 2021-08-06 MED ORDER — ISOSORBIDE MONONITRATE ER 60 MG PO TB24
120.0000 mg | ORAL_TABLET | Freq: Every day | ORAL | Status: DC
  Administered 2021-08-07 – 2021-08-09 (×3): 120 mg via ORAL
  Filled 2021-08-06 (×3): qty 2

## 2021-08-06 NOTE — Progress Notes (Signed)
Heart Failure Nurse Navigator Progress Note  PCP: [name], MD PCP-Cardiologist: Erlene Quan., MD Admission Diagnosis: CHF Admitted from: home with spouse  Presentation:   Albert Powell presented 11/21 with increased SOB. Pt resting in bed with HOB flat on room air. Pt interactive with interview process. Pt was seen at [facility] T[facility] 7/7 for a hospital follow up. Pt has had 3 admits for HF symptoms within past 6 mo. Pt states he drives and bills are all caught up at this time. Pt has concerns as spouse is getting ready to "begin radiation therapy for lung cancer".  Pt states his lasix prescription was for PRN, and he has been taking it most days with minimal relief lately. States he hasn't been taking his "diabetes medicine" d/t cost. Endorses dietary indiscretion as an issue. Pt has trouble following a consistent low sodium diet.  Explained benefits of HV TOC, pt agreeable.   ECHO/ LVEF: 60-65%, mild LVH  Clinical Course:  Past Medical History:  Diagnosis Date   Atrial fibrillation ([name]) 03/30/2020   Bradycardia, sinus 02/18/2013   CAD (coronary artery disease)    stent to RCA 2003 also had 40% lesion at that time; NUCLEAR STRESS TEST, 01/16/2010 - normal  now with cath 80-90% stenosis in LAD, will try medical therapy if no improvement PCI   Carotid artery disease ([name])    Cataract    CHF (congestive heart failure) ([name])    Claudication ([name])    LEA DUPLEX, 07/07/2008 - Normal   COPD (chronic obstructive pulmonary disease) ([name])    DDD (degenerative disc disease) 2008   Diabetes mellitus    GERD (gastroesophageal reflux disease)    H/O hiatal hernia    History of colonoscopy 2004   finding of tics and AMV only no polpys   Hyperlipidemia 02/05/2013   Hypertension    Onychomycosis    Rosacea    Tobacco abuse 02/05/2013     Social History   Socioeconomic History   Marital status: Married    Spouse name: Lateef Juncaj   Number of children: 1   Years of education:  Not on file   Highest education level: High school graduate  Occupational History   Occupation: retired  Tobacco Use   Smoking status: Former    Packs/day: 1.50    Years: 57.00    Pack years: 85.50    Types: Cigarettes    Quit date: 04/06/2013    Years since quitting: 8.3   Smokeless tobacco: Never   Tobacco comments:    pt states that he is using the vapor cigs  Vaping Use   Vaping Use: Every day   Start date: 09/15/2016   Substances: Nicotine  Substance and Sexual Activity   Alcohol use: Yes    Alcohol/week: 1.0 standard drink    Types: 1 Glasses of wine per week    Comment: "very little"   Drug use: No   Sexual activity: Not on file  Other Topics Concern   Not on file  Social History Narrative   Not on file   Social Determinants of Health   Financial Resource Strain: Low Risk    Difficulty of Paying Living Expenses: Not very hard  Food Insecurity: No Food Insecurity   Worried About Running Out of Food in the Last Year: Never true   Ran Out of Food in the Last Year: Never true  Transportation Needs: No Transportation Needs   Lack of Transportation (Medical): No   Lack of Transportation (Non-Medical): No  Physical Activity: Not on file  Stress: Not on file  Social Connections: Not on file    High Risk Criteria for Readmission and/or Poor Patient Outcomes: Heart failure hospital admissions (last 6 months): 3  No Show rate: 4% Difficult social situation: no Demonstrates medication adherence: NO Primary Language: English Literacy level: able to read/write and comprehend.   Education Assessment and Provision:  Detailed education and instructions provided on heart failure disease management including the following:  Signs and symptoms of Heart Failure When to call the physician Importance of daily weights Low sodium diet Fluid restriction Medication management Anticipated future follow-up appointments  Patient education given on each of the above topics.   Patient acknowledges understanding via teach back method and acceptance of all instructions.  Education Materials:  "Living Better With Heart Failure" Booklet, HF zone tool, & Daily Weight Tracker Tool.  Patient has scale at home: yes Patient has pill box at home: yes   Barriers of Care:   -medication cost  Considerations/Referrals:   Referral made to Heart Failure Pharmacist Stewardship: no Referral made to Heart Failure CSW/NCM TOC: no Referral made to Heart & Vascular TOC clinic: yes, 12/1 @ 3pm  Items for Follow-up on DC/TOC: -optimize -med compliance -med cost -dietary/fluid modifications   Pricilla Holm, MSN, RN Heart Failure Nurse Navigator [phone]

## 2021-08-06 NOTE — ED Notes (Signed)
Pt to xray

## 2021-08-06 NOTE — ED Notes (Signed)
Pt wheeled to restroom and back.

## 2021-08-06 NOTE — Consult Note (Addendum)
Cardiology Consultation:   Patient ID: Albert Powell MRN: [id number]; DOB: 1941-06-28  Admit date: 08/05/2021 Date of Consult: 08/06/2021  PCP:  [name], MD   [facility] Providers Cardiologist:  Nanetta Batty, MD        Patient Profile:   Albert Powell is a 80 y.o. male with a hx of CAD dating back to 2003, HTN, HLD, DM-2, PVD, tobacco use, chronic diastolic CHF  who is being seen 08/06/2021 for the evaluation of acute CHF at the request of Dr. Jarvis Newcomer.  History of Present Illness:   Albert Powell with above hx and for CAD 2 stents to RCA in 2003, neg nuc 2014, last cath 2021 with patent RCA stent with intermediate lesion just beyond the stented segment that was negative by FFR.   Prox RCA to mRCA is 70% stenosed.  pLAD to mLAD 50% stenosed, pLCx to mLCx 40% stenosis.  Hx PAF on DOAC.  It was felt with last chest pain and stable Cors that chest pain related to atrial fib. He was discharged on plavix and eliquis.    His PVD with LICA endarterectomy 2015, and lower ext claudication with DBX stents to iliac bifurcation by Dr. [name] 2019.  Last PV angio 08/2020 with high-grade exophytic calcific disease in his distal right external neck artery and right common femoral artery which will need to be treated surgically with endarterectomy and patch angioplasty.  He also has a high-grade exophytic calcific plaque in his mid left SFA which can be treated with orbital atherectomy plus or minus drug-coated balloon angioplasty plus or minus stenting- I do not see that this was accomplished. Dr. Allyson Sabal was going to refer back to Dr. [name].    12/2020 HTN emergency with flash pulmonary edema and a fib with RVR.  Admit 03/10/21 with acute on chronic diastolic CHF.  ECHO with EF 60-65%, G1DD, small pericardial effusion.  Diuresed. Did well with stable wt of 229-230 lbs.     Admit 06/06/21 with acute on chronic diastolic CHF.   BP on arrival was 221/105.  He also had acute metabolic  encephalopathy.  Improved with BP control.  He did have AKI with admit.  He was maintaining SR.  Echo 06/07/21 with EF 60-65%, no RWMA, mild LVH.  Small pericardial effusion.   Pt presented 08/05/21 with SOB for 3 days, + lower ext edema.  Actually increased symptoms began 3 weeks ago.  Sp02 was 91%.  No chest pain.   At home pt takes lasix prn for swelling and wt gain but home scale is broken. On last discharge he was to take lasix daily.  Over last 3 weeks developed exertional dyspnea, no cough, he took 20 mg of lasix on Friday and Sat but no improvement.    In ER Na 137, Cr 1.43  today Na 137, K+ 3.7 Cr 1.55 BNP >4,500 Hs troponin 101 and then 91 Mg+ 2.4   WBC 10.3 Hgb 13.4 plts 294 Resp panel neg.  2V CXR: Small to moderate-sized bilateral pleural effusions with bibasilar airspace disease, favored to be atelectasis. Mild central pulmonary vascular congestion/mild edema.  2V CXR today IMPRESSION: 1. Small bilateral pleural effusions with areas of bibasilar atelectasis and/or consolidation. 2. Cephalization of the pulmonary vasculature, without frank pulmonary edema. 3. Aortic atherosclerosis    EKG:  The EKG was personally reviewed and demonstrates:  SR with PVC and 1st degree AV block PR 224 ms no ST changes Telemetry:  Telemetry was personally reviewed and demonstrates:  SR to SB   BP 162/84 P 62  afebrile, R 18-29 Rec'd IV lasix 40 mg last pm and 40 mg today for one time dose.  I&O neg 250 wt 102 Kg  224 bs down from OV   Currently resting better.  No chest pain.  SOB is better.  Some feet swelling Rt > Lt.  Past Medical History:  Diagnosis Date   Atrial fibrillation ([place]) 03/30/2020   Bradycardia, sinus 02/18/2013   CAD (coronary artery disease)    stent to RCA 2003 also had 40% lesion at that time; NUCLEAR STRESS TEST, 01/16/2010 - normal  now with cath 80-90% stenosis in LAD, will try medical therapy if no improvement PCI   Carotid artery disease ([name])    Cataract    CHF  (congestive heart failure) ([place])    Claudication ([place])    LEA DUPLEX, 07/07/2008 - Normal   COPD (chronic obstructive pulmonary disease) ([place])    DDD (degenerative disc disease) 2008   Diabetes mellitus    GERD (gastroesophageal reflux disease)    H/O hiatal hernia    History of colonoscopy 2004   finding of tics and AMV only no polpys   Hyperlipidemia 02/05/2013   Hypertension    Onychomycosis    Rosacea    Tobacco abuse 02/05/2013    Past Surgical History:  Procedure Laterality Date   ABDOMINAL AORTOGRAM W/LOWER EXTREMITY N/A 03/30/2018   Procedure: ABDOMINAL AORTOGRAM W/LOWER EXTREMITY;  Surgeon: Serafina Mitchell, MD;  Location: [place] [facility];  Service: Cardiovascular;  Laterality: N/A;   ABDOMINAL AORTOGRAM W/LOWER EXTREMITY Bilateral 08/27/2020   Procedure: ABDOMINAL AORTOGRAM W/LOWER EXTREMITY;  Surgeon: Lorretta Harp, MD;  Location: [place] [facility];  Service: Cardiovascular;  Laterality: Bilateral;   APPENDECTOMY     CARDIAC CATHETERIZATION  08/29/2002   2-vessel CAD with high-grade stenoses in RCA; stenting to RCA   CARDIAC CATHETERIZATION  2014   80-90% lesion will try medical therapy   CARDIAC SURGERY     stents  2003   CARDIOVERSION N/A 11/01/2013   Procedure: [place] COMPRESSION;  Surgeon: Lorretta Harp, MD;  Location: [facility] C[facility] LAB;  Service: Cardiovascular;  Laterality: N/A;   CAROTID ANGIOGRAM N/A 04/25/2013   Procedure: CAROTID ANGIOGRAM;  Surgeon: Lorretta Harp, MD;  Location: [facility] CATH LAB;  Service: Cardiovascular;  Laterality: N/A;   CAROTID ENDARTERECTOMY     COLON RESECTION  3/04, 6/04    1 bleeding diverticulitis, 2 complete colectomy   COLON SURGERY  2005   renal pouch rectal anastimosis   CORONARY ANGIOPLASTY WITH STENT PLACEMENT  09/2002   2 stents to RCA   ENDARTERECTOMY Left 06/02/2013   Procedure: ENDARTERECTOMY CAROTID-LEFT;  Surgeon: Serafina Mitchell, MD;  Location: [place];  Service: Vascular;  Laterality: Left;   INTRAVASCULAR  PRESSURE WIRE/FFR STUDY N/A 04/02/2020   Procedure: INTRAVASCULAR PRESSURE WIRE/FFR STUDY;  Surgeon: Lorretta Harp, MD;  Location: [place] [facility];  Service: Cardiovascular;  Laterality: N/A;  DFR - RCA   LEFT HEART CATH AND CORONARY ANGIOGRAPHY N/A 04/02/2020   Procedure: LEFT HEART CATH AND CORONARY ANGIOGRAPHY;  Surgeon: Lorretta Harp, MD;  Location: [place] [facility];  Service: Cardiovascular;  Laterality: N/A;   LEFT HEART CATHETERIZATION WITH CORONARY ANGIOGRAM N/A 02/08/2013   Procedure: LEFT HEART CATHETERIZATION WITH CORONARY ANGIOGRAM;  Surgeon: Lorretta Harp, MD;  Location: [facility] CATH LAB;  Service: Cardiovascular;  Laterality: N/A;   LOWER EXTREMITY ANGIOGRAM N/A 10/20/2013   Procedure:  LOWER EXTREMITY ANGIOGRAM;  Surgeon: Lorretta Harp, MD;  Location: [facility] CATH LAB;  Service: Cardiovascular;  Laterality: N/A;   PATCH ANGIOPLASTY Left 06/02/2013   Procedure: PATCH ANGIOPLASTY;  Surgeon: Serafina Mitchell, MD;  Location[facility];  Service: Vascular;  Laterality: Left;   PERIPHERAL VASCULAR INTERVENTION Bilateral 03/30/2018   Procedure: PERIPHERAL VASCULAR INTERVENTION;  Surgeon: Serafina Mitchell, MD;  Location: [place] [facility];  Service: Cardiovascular;  Laterality: Bilateral;  common iliacs   TONSILLECTOMY       Home Medications:  Prior to Admission medications   Medication Sig Start Date End Date Taking? Authorizing Provider  apixaban (ELIQUIS) 5 MG TABS tablet Take 1 tablet (5 mg total) by mouth 2 (two) times daily. 03/21/21  Yes Katherine Roan, MD  atorvastatin (LIPITOR) 40 MG tablet Take 40 mg by mouth at bedtime.    Yes [provider]  clopidogrel (PLAVIX) 75 MG tablet Take 1 tablet (75 mg total) by mouth daily. 01/04/21  Yes Little Ishikawa, MD  dapagliflozin propanediol (FARXIGA) 10 MG TABS tablet Take 1 tablet (10 mg total) by mouth daily before breakfast. 04/25/21  Yes Winfrey, Jenne Pane, MD  escitalopram (LEXAPRO) 20 MG tablet Take 20 mg by mouth daily.  01/18/21  Yes [provider]  furosemide (LASIX) 20 MG tablet Take 1 tablet (20 mg total) by mouth daily. 06/10/21  Yes Charlynne Cousins, MD  glipiZIDE (GLUCOTROL) 5 MG tablet Take 5 mg by mouth daily before breakfast.   Yes [provider]  isosorbide mononitrate (IMDUR) 60 MG 24 hr tablet Take 1 tablet (60 mg total) by mouth daily. 01/04/21  Yes Little Ishikawa, MD  losartan (COZAAR) 100 MG tablet Take 100 mg by mouth daily.   Yes [provider]  metoprolol tartrate (LOPRESSOR) 25 MG tablet Take 1 tablet (25 mg total) by mouth 2 (two) times daily. 01/03/21  Yes Little Ishikawa, MD  OZEMPIC, 0.25 OR 0.5 MG/DOSE, 2 MG/1.5ML SOPN Inject 0.5 mg into the skin once a week. 07/05/21  Yes [provider]  pantoprazole (PROTONIX) 40 MG tablet Take 40 mg by mouth daily.    Yes [provider]  Polyethyl Glycol-Propyl Glycol (SYSTANE FREE OP) Place 1 drop into both eyes daily as needed (dry eyes).   Yes [provider]  potassium chloride SA (KLOR-CON) 20 MEQ tablet Take 20 mEq by mouth daily.   Yes [provider]  sacubitril-valsartan (ENTRESTO) 49-51 MG Take 1 tablet by mouth 2 (two) times daily. 05/09/21  Yes Bensimhon, Shaune Pascal, MD  spironolactone (ALDACTONE) 25 MG tablet Take 0.5 tablets (12.5 mg total) by mouth daily. 04/25/21 08/06/21 Yes Katherine Roan, MD    Inpatient Medications: Scheduled Meds:  apixaban  5 mg Oral BID   atorvastatin  40 mg Oral QHS   clopidogrel  75 mg Oral Daily   dapagliflozin propanediol  10 mg Oral QAC breakfast   escitalopram  20 mg Oral Daily   furosemide  40 mg Intravenous Daily   glipiZIDE  5 mg Oral QAC breakfast   insulin aspart  0-9 Units Subcutaneous TID WC   isosorbide mononitrate  60 mg Oral Daily   metoprolol tartrate  25 mg Oral BID   pantoprazole  40 mg Oral Daily   potassium chloride  40 mEq Oral Once   sacubitril-valsartan  1 tablet Oral BID   sodium chloride flush  3 mL  Intravenous Q12H   spironolactone  12.5 mg Oral Daily  Continuous Infusions:  sodium chloride     PRN Meds: sodium chloride, acetaminophen, ondansetron (ZOFRAN) IV, sodium chloride flush  Allergies:    Allergies  Allergen Reactions   Fentanyl Other (See Comments)    Behavioral changes   Gabapentin Other (See Comments)    ankle swells   Lisinopril Other (See Comments)    unknown   Lyrica [Pregabalin] Other (See Comments)    unknown   Metformin Diarrhea   Propofol Other (See Comments)    Heart rate dropped    Social History:   Social History   Socioeconomic History   Marital status: Married    Spouse name: Estanislado Score   Number of children: 1   Years of education: Not on file   Highest education level: High school graduate  Occupational History   Occupation: retired  Tobacco Use   Smoking status: Former    Packs/day: 1.00    Years: 57.00    Pack years: 57.00    Types: Cigarettes    Quit date: 04/06/2013    Years since quitting: 8.3   Smokeless tobacco: Never   Tobacco comments:    pt states that he is using the vapor cigs  Vaping Use   Vaping Use: Every day   Start date: 09/15/2016   Substances: Nicotine  Substance and Sexual Activity   Alcohol use: Yes    Alcohol/week: 1.0 standard drink    Types: 1 Glasses of wine per week    Comment: "very little"   Drug use: No   Sexual activity: Not on file  Other Topics Concern   Not on file  Social History Narrative   Not on file   Social Determinants of Health   Financial Resource Strain: Medium Risk   Difficulty of Paying Living Expenses: Somewhat hard  Food Insecurity: No Food Insecurity   Worried About Running Out of Food in the Last Year: Never true   Ran Out of Food in the Last Year: Never true  Transportation Needs: No Transportation Needs   Lack of Transportation (Medical): No   Lack of Transportation (Non-Medical): No  Physical Activity: Not on file  Stress: Not on file  Social Connections:  Not on file  Intimate Partner Violence: Not on file    Family History:    Family History  Problem Relation Age of Onset   Heart disease Father        heart attack at 84   Heart attack Father    Hyperlipidemia Father    Colonic polyp Mother        that bled out   COPD Mother    Diabetes Mother    Heart disease Sister    Colonic polyp Sister    Stroke Maternal Grandfather    Heart attack Paternal Grandfather    Other Neg Hx        hypogonadism     ROS:  Please see the history of present illness.  General:no colds or fevers, no weight changes Skin:no rashes or ulcers HEENT:no blurred vision, no congestion CV:see HPI PUL:see HPI GI:no diarrhea constipation or melena, no indigestion GU:no hematuria, no dysuria MS:no joint pain, no claudication Neuro:no syncope, no lightheadedness Endo:+ diabetes, no thyroid disease  All other ROS reviewed and negative.     Physical Exam/Data:   Vitals:   08/06/21 0900 08/06/21 0950 08/06/21 1024 08/06/21 1032  BP: 140/74  (!) 134/108 (!) 162/84  Pulse: 67  72 62  Resp: 18  20 18   Temp:  98.1  F (36.7 C) 97.8 F (36.6 C)   TempSrc:  Oral Oral   SpO2: 93%  97% 95%  Weight:   102 kg     Intake/Output Summary (Last 24 hours) at 08/06/2021 1310 Last data filed at 08/06/2021 1100 Gross per 24 hour  Intake --  Output 250 ml  Net -250 ml   Last 3 Weights 08/06/2021 08/05/2021 06/14/2021  Weight (lbs) 224 lb 13.9 oz 229 lb 8 oz 229 lb 6.4 oz  Weight (kg) 102 kg 104.1 kg 104.055 kg     Body mass index is 33.21 kg/m.  General:  Well nourished, well developed, in no acute distress HEENT: normal Neck: no JVD sitting up Vascular: No carotid bruits; Distal pulses 1+ bilaterally Cardiac:  normal S1, S2; RRR; no murmur gallup rub or click Lungs:  rales in base fine,  to auscultation bilaterally, no wheezing, rhonchi  Abd: soft, nontender, no hepatomegaly  Ext: no edema Musculoskeletal:  No deformities, BUE and BLE strength normal  and equal Skin: warm and dry  Neuro:  alert and oriented X 3 MAE follows commands, no focal abnormalities noted Psych:  Normal affect   Relevant CV Studies: Echo 06/07/21 IMPRESSIONS     1. Left ventricular ejection fraction, by estimation, is 60 to 65%. The  left ventricle has normal function. The left ventricle has no regional  wall motion abnormalities. There is mild concentric left ventricular  hypertrophy. Left ventricular diastolic  parameters are indeterminate. Elevated left ventricular end-diastolic  pressure.   2. Right ventricular systolic function is normal. The right ventricular  size is normal. Tricuspid regurgitation signal is inadequate for assessing  PA pressure.   3. A small pericardial effusion is present. The pericardial effusion is  posterior to the left ventricle.   4. The mitral valve is grossly normal. No evidence of mitral valve  regurgitation. No evidence of mitral stenosis.   5. The aortic valve is grossly normal. Aortic valve regurgitation is not  visualized. Mild aortic valve sclerosis is present, with no evidence of  aortic valve stenosis.   6. The inferior vena cava is normal in size with greater than 50%  respiratory variability, suggesting right atrial pressure of 3 mmHg.   Comparison(s): No significant change from prior study.   FINDINGS   Left Ventricle: Left ventricular ejection fraction, by estimation, is 60  to 65%. The left ventricle has normal function. The left ventricle has no  regional wall motion abnormalities. The left ventricular internal cavity  size was normal in size. There is   mild concentric left ventricular hypertrophy. Left ventricular diastolic  parameters are indeterminate. Elevated left ventricular end-diastolic  pressure. The E/e' is 22.3.   Right Ventricle: The right ventricular size is normal. Right vetricular  wall thickness was not well visualized. Right ventricular systolic  function is normal. Tricuspid  regurgitation signal is inadequate for  assessing PA pressure.   Left Atrium: Left atrial size was normal in size.   Right Atrium: Right atrial size was normal in size.   Pericardium: A small pericardial effusion is present. The pericardial  effusion is posterior to the left ventricle.   Mitral Valve: The mitral valve is grossly normal. No evidence of mitral  valve regurgitation. No evidence of mitral valve stenosis.   Tricuspid Valve: The tricuspid valve is grossly normal. Tricuspid valve  regurgitation is not demonstrated. No evidence of tricuspid stenosis.   Aortic Valve: The aortic valve is grossly normal. Aortic valve  regurgitation is not visualized. Mild  aortic valve sclerosis is present,  with no evidence of aortic valve stenosis.    03/10/21  echo IMPRESSIONS     1. Left ventricular ejection fraction, by estimation, is 60 to 65%. The  left ventricle has normal function. The left ventricle has no regional  wall motion abnormalities. There is mild concentric left ventricular  hypertrophy. Left ventricular diastolic  parameters are consistent with Grade I diastolic dysfunction (impaired  relaxation).   2. Right ventricular systolic function is normal. The right ventricular  size is normal. Tricuspid regurgitation signal is inadequate for assessing  PA pressure.   3. A small pericardial effusion is present. The pericardial effusion is  circumferential. There is no evidence of cardiac tamponade.   4. The mitral valve is grossly normal. No evidence of mitral valve  regurgitation. No evidence of mitral stenosis.   5. The aortic valve is tricuspid. There is mild calcification of the  aortic valve. Aortic valve regurgitation is not visualized. Mild aortic  valve sclerosis is present, with no evidence of aortic valve stenosis.   6. The inferior vena cava is normal in size with greater than 50%  respiratory variability, suggesting right atrial pressure of 3 mmHg.    Comparison(s): No significant change from prior study.   FINDINGS   Left Ventricle: Left ventricular ejection fraction, by estimation, is 60  to 65%. The left ventricle has normal function. The left ventricle has no  regional wall motion abnormalities. Definity contrast agent was given IV  to delineate the left ventricular   endocardial borders. The left ventricular internal cavity size was normal  in size. There is mild concentric left ventricular hypertrophy. Left  ventricular diastolic parameters are consistent with Grade I diastolic  dysfunction (impaired relaxation).   Right Ventricle: The right ventricular size is normal. No increase in  right ventricular wall thickness. Right ventricular systolic function is  normal. Tricuspid regurgitation signal is inadequate for assessing PA  pressure.   Left Atrium: Left atrial size was normal in size.   Right Atrium: Right atrial size was normal in size.   Pericardium: A small pericardial effusion is present. The pericardial  effusion is circumferential. There is no evidence of cardiac tamponade.   Mitral Valve: The mitral valve is grossly normal. Mild to moderate mitral  annular calcification. No evidence of mitral valve regurgitation. No  evidence of mitral valve stenosis.   Tricuspid Valve: The tricuspid valve is grossly normal. Tricuspid valve  regurgitation is not demonstrated. No evidence of tricuspid stenosis.   Aortic Valve: The aortic valve is tricuspid. There is mild calcification  of the aortic valve. Aortic valve regurgitation is not visualized. Mild  aortic valve sclerosis is present, with no evidence of aortic valve  stenosis. Aortic valve mean gradient  measures 4.0 mmHg. Aortic valve peak gradient measures 7.6 mmHg. Aortic  valve area, by VTI measures 2.24 cm.   Pulmonic Valve: The pulmonic valve was grossly normal. Pulmonic valve  regurgitation is not visualized. No evidence of pulmonic stenosis.   Aorta: The  aortic root is normal in size and structure  Cardiac cath 04/02/21 Previously placed Ost RCA to Prox RCA stent (unknown type) is widely patent. Prox RCA to Mid RCA lesion is 70% stenosed. Prox LAD to Mid LAD lesion is 50% stenosed. Prox Cx to Mid Cx lesion is 40% stenosed.   Diagnostic Dominance: Right  Laboratory Data:  High Sensitivity Troponin:   Recent Labs  Lab 08/05/21 1726 08/05/21 2151  TROPONINIHS 101* 91*  Chemistry Recent Labs  Lab 08/05/21 1544 08/06/21 0322  NA 137 137  K 3.2* 3.7  CL 99 101  CO2 26 25  GLUCOSE 175* 98  BUN 15 21  CREATININE 1.43* 1.55*  CALCIUM 8.6* 8.7*  MG  --  2.4  GFRNONAA 50* 45*  ANIONGAP 12 11    No results for input(s): PROT, ALBUMIN, AST, ALT, ALKPHOS, BILITOT in the last 168 hours. Lipids No results for input(s): CHOL, TRIG, HDL, LABVLDL, LDLCALC, CHOLHDL in the last 168 hours.  Hematology Recent Labs  Lab 08/05/21 1544  WBC 10.3  RBC 4.52  HGB 13.4  HCT 41.1  MCV 90.9  MCH 29.6  MCHC 32.6  RDW 14.6  PLT 294   Thyroid No results for input(s): TSH, FREET4 in the last 168 hours.  BNP Recent Labs  Lab 08/05/21 1544  BNP >4,500.0*    DDimer No results for input(s): DDIMER in the last 168 hours.   Radiology/Studies:  DG Chest 2 View  Result Date: 08/06/2021 CLINICAL DATA:  80 year old male with history of shortness of breath. EXAM: CHEST - 2 VIEW COMPARISON:  Chest x-ray 08/05/2021. FINDINGS: Lung volumes are normal. Bibasilar opacities which may reflect areas of atelectasis and/or consolidation. Small bilateral pleural effusions. Cephalization of the pulmonary vasculature, without frank pulmonary edema. Heart size is borderline enlarged. Upper mediastinal contours are within normal limits. Atherosclerotic calcifications are noted in the thoracic aorta. Spinal cord stimulator projecting over the midthoracic region. IMPRESSION: 1. Small bilateral pleural effusions with areas of bibasilar atelectasis and/or  consolidation. 2. Cephalization of the pulmonary vasculature, without frank pulmonary edema. 3. Aortic atherosclerosis. Electronically Signed   By: Vinnie Langton M.D.   On: 08/06/2021 07:03   DG Chest 2 View  Result Date: 08/05/2021 CLINICAL DATA:  Shortness of breath EXAM: CHEST - 2 VIEW COMPARISON:  Radiograph 06/06/2021 FINDINGS: Unchanged size of the cardiomediastinal silhouette. There is bibasilar airspace disease and small to moderate size bilateral pleural effusions. No visible pneumothorax. Mild central pulmonary vascular congestion. No acute osseous abnormality. Spinal cord stimulator lead overlies the mid chest. IMPRESSION: Small to moderate-sized bilateral pleural effusions with bibasilar airspace disease, favored to be atelectasis. Mild central pulmonary vascular congestion/mild edema. Electronically Signed   By: Maurine Simmering M.D.   On: 08/05/2021 16:33     Assessment and Plan:   Acute on chronic diastolic CHF last hospitalized 06/07/21 with hypertensive emergency.  He rec'd IV lasix in ER. Discharged on 20 mg PO lasix daily but only takes as needed.  On imdur, BB, entresto and spironolactone also on farxiga.  HTN uncontrolled on admit.  Now still elevated.   In office systolic BP was [id number], needs improved BP control  needs more BP control.  PAF but maintainig SR on eliquis  check TSH CAD with prior stents to RCA in 2003 and has been patent, last cath 2021 with stable disease and some stenosis distal to prior stents in RCA but neg FFR.  On BB, Plavix statin.  PAD with hx of CEA on Lt.  Also lower ext disease with stent to iliac bifurcation.  Bilateral that were patent and Rt extailiac stent.   He did have 50 to 70% calcified stenoses in his left extrailiac artery and common femoral artery as well as high-grade diffuse calcified 90% calcified stenoses in the distal right extrailiac artery and common femoral artery as well as a focal 99% mid right SFA stenosis.  Dr. Kennon Holter plan was to send to  Dr. Trula Slade for  possible surgical revascularization.   Has not been done.  He tells me he knows he needs to see Dr. Trula Slade.  Some claudication  HLD on lipitor no recent lipids but done through PCP DM-2 on insulin last A1c 7.1 in June CKD  3  with average Cr 1.4 to 1.6.  Tobacco use , discussed stopping. He stopped cigarettes and is now vaping, does have nicotine but no tar.  He is surprised that he has been able to to this.     Risk Assessment/Risk Scores:        New York Heart Association (NYHA) Functional Class NYHA Class III        For questions or updates, please contact [facility] Please consult [email or web address] for contact info under    Signed, Cecilie Kicks, NP  08/06/2021 1:10 PM

## 2021-08-06 NOTE — Progress Notes (Signed)
Patient admitted to 2 C room 1. VSS, patient oriented to room, CCMD notified. Netta Corrigan, RN

## 2021-08-06 NOTE — Evaluation (Signed)
Physical Therapy Evaluation Patient Details Name: Albert Powell MRN: [id number] DOB: 12-10-1940 Today's Date: 08/06/2021  History of Present Illness  Pt is a 80 y/o male admitted secondary to increased SOB. Thought to be secondary to CHF exacerbation. PMH includes dCHF, a fib, DM, CKD, COPD, and HTN.  Clinical Impression  Pt admitted secondary to problem above with deficits below. Pt requiring min A for bed mobility and min guard A to stand and ambulate short distance in ED room. VSS throughout. Anticipate pt will progress well. REcommend HHPT at d/c to address deficits. Will continue to follow acutely.        Recommendations for follow up therapy are one component of a multi-disciplinary discharge planning process, led by the attending physician.  Recommendations may be updated based on patient status, additional functional criteria and insurance authorization.  Follow Up Recommendations Home health PT    Assistance Recommended at Discharge Intermittent Supervision/Assistance  Functional Status Assessment Patient has had a recent decline in their functional status and demonstrates the ability to make significant improvements in function in a reasonable and predictable amount of time.  Equipment Recommendations  None recommended by PT    Recommendations for Other Services       Precautions / Restrictions Precautions Precautions: Fall Restrictions Weight Bearing Restrictions: No      Mobility  Bed Mobility Overal bed mobility: Needs Assistance Bed Mobility: Supine to Sit;Sit to Supine     Supine to sit: Min assist Sit to supine: Min assist   General bed mobility comments: Required assist for trunk and LE assist.    Transfers Overall transfer level: Needs assistance Equipment used: 1 person hand held assist Transfers: Sit to/from Stand Sit to Stand: Min guard           General transfer comment: Min guard for safety    Ambulation/Gait Ambulation/Gait  assistance: Min guard Gait Distance (Feet): 10 Feet Assistive device: 1 person hand held assist Gait Pattern/deviations: Step-through pattern;Decreased stride length Gait velocity: Decreased     General Gait Details: Min guard for safety. No overt LOB noted. Oxygen sats WFL on supplemental oxygen  Stairs            Wheelchair Mobility    Modified Rankin (Stroke Patients Only)       Balance Overall balance assessment: Needs assistance Sitting-balance support: No upper extremity supported;Feet supported Sitting balance-Leahy Scale: Good     Standing balance support: Single extremity supported Standing balance-Leahy Scale: Poor Standing balance comment: Reliant on at least 1UE support                             Pertinent Vitals/Pain Pain Assessment: No/denies pain    Home Living Family/patient expects to be discharged to:: Private residence Living Arrangements: Spouse/significant other Available Help at Discharge: Family;Available 24 hours/day Type of Home: Apartment Home Access: Level entry       Home Layout: One level Home Equipment: IT sales professional (4 wheels);Shower seat;Cane - single point      Prior Function Prior Level of Function : Needs assist       Physical Assist : ADLs (physical)   ADLs (physical): Bathing;Dressing Mobility Comments: USes rollator for mobility ADLs Comments: Requires assist getting out of shower     Hand Dominance        Extremity/Trunk Assessment   Upper Extremity Assessment Upper Extremity Assessment: Defer to OT evaluation    Lower Extremity Assessment Lower Extremity Assessment:  Generalized weakness    Cervical / Trunk Assessment Cervical / Trunk Assessment: Normal  Communication   Communication: No difficulties  Cognition Arousal/Alertness: Awake/alert Behavior During Therapy: WFL for tasks assessed/performed Overall Cognitive Status: Within Functional Limits for tasks assessed                                           General Comments      Exercises     Assessment/Plan    PT Assessment Patient needs continued PT services  PT Problem List Decreased strength;Decreased activity tolerance;Decreased balance;Decreased mobility;Decreased knowledge of use of DME;Decreased knowledge of precautions       PT Treatment Interventions DME instruction;Gait training;Functional mobility training;Therapeutic activities;Balance training;Therapeutic exercise;Patient/family education    PT Goals (Current goals can be found in the Care Plan section)  Acute Rehab PT Goals Patient Stated Goal: to feel better PT Goal Formulation: With patient Time For Goal Achievement: 08/20/21 Potential to Achieve Goals: Good    Frequency Min 3X/week   Barriers to discharge        Co-evaluation               AM-PAC PT "6 Clicks" Mobility  Outcome Measure Help needed turning from your back to your side while in a flat bed without using bedrails?: None Help needed moving from lying on your back to sitting on the side of a flat bed without using bedrails?: A Little Help needed moving to and from a bed to a chair (including a wheelchair)?: A Little Help needed standing up from a chair using your arms (e.g., wheelchair or bedside chair)?: A Little Help needed to walk in hospital room?: A Little Help needed climbing 3-5 steps with a railing? : A Little 6 Click Score: 19    End of Session Equipment Utilized During Treatment: Gait belt Activity Tolerance: Patient tolerated treatment well Patient left: in bed;with call bell/phone within reach (on stretcher in ED) Nurse Communication: Mobility status PT Visit Diagnosis: Unsteadiness on feet (R26.81);Muscle weakness (generalized) (M62.81)    Time: 4097-3532 PT Time Calculation (min) (ACUTE ONLY): 12 min   Charges:   PT Evaluation $PT Eval Low Complexity: 1 Low          Cindee Salt, DPT  Acute  Rehabilitation Services  Pager: [phone] Office: [phone]   Albert Powell 08/06/2021, 12:45 PM

## 2021-08-06 NOTE — Progress Notes (Signed)
PROGRESS NOTE  Albert Powell  TDS:[id number] DOB: 01-04-1941 DOA: 08/05/2021 PCP: [name], MD  Outpatient Specialists: Cardiology, Dr. Allyson Sabal. Brief Narrative: Albert Powell is a 80 y.o. male with medical history significant of PAF on Eliquis, CAD on Plavix, chronic diastolic CHF on Entresto, IIDM, CKD stage IIIb, came with increasing shortness of breath and leg swelling. In the ED, CXR showed bilateral pleural effusion and pulmonary vascular congestion. Lasix 40mg  IV given and patient admitted with cardiology consultation.  Assessment & Plan: Principal Problem:   CHF (congestive heart failure) ([name])  Acute on chronic HFpEF, HTN: Precipitated by insufficient adherence to lasix.  - BNP >4500, higher than prior checks, has multiple CHF readmissions. Cardiology input and follow up appreciated. - Continue lasix 40mg  IV BID - Monitor I/O, weights, Cr - To improve BP control, increasing spironolactone and imdur.  - Continue metoprolol and entresto. Note has losartan and entresto on med list.    CAD hx stents with demand myocardial ischemia: Troponin trending downward. No anginal chest pain.  - BB, statin, plavix, anticoagulation, work up per cardiology  Stage IIIa CKD:  -Fluid overload, chronic exacerbated, continue IV diuresis.   IDT2DM: HbA1c 7.1%.  - SSI, at inpatient goal - Hold semaglutide and glipizide, continue dapagliflozin,    PAF: Maintaining NSR with PVCs - Continue eliquis  - Keep K, Mg replete - Last TSH wnl.    PAD: With claudication.  - Pending referral to vascular surgery.  - Continue statin, plavix, anticoagulation.    Depression: With adjustment reaction to wife's CA Dx.  - Continue SSRI  Obesity: Estimated body mass index is 33.21 kg/m as calculated from the following:   Height as of 06/14/21: 5\' 9"  (1.753 m).   Weight as of this encounter: 102 kg.  DVT prophylaxis: Eliquis Code Status: DNR Family Communication: None at  bedside Disposition Plan:  Status is: Inpatient  Remains inpatient appropriate because: IV diuresis  Consultants:  Cardiology  Procedures:  Echo  Antimicrobials: None   Subjective: Breathing is improved, less dyspneic overall, still short of breath with exertion and feels he is still swollen.   Objective: Vitals:   08/06/21 0900 08/06/21 0950 08/06/21 1024 08/06/21 1032  BP: 140/74  (!) 134/108 (!) 162/84  Pulse: 67  72 62  Resp: 18  20 18   Temp:  98.1 F (36.7 C) 97.8 F (36.6 C)   TempSrc:  Oral Oral   SpO2: 93%  97% 95%  Weight:   102 kg     Intake/Output Summary (Last 24 hours) at 08/06/2021 1617 Last data filed at 08/06/2021 1100 Gross per 24 hour  Intake --  Output 250 ml  Net -250 ml   Filed Weights   08/05/21 2000 08/06/21 1024  Weight: 104.1 kg 102 kg   Gen: 79 y.o. male in no distress Pulm: Non-labored tachypnea, crackles at bases. CV: Regular rate and rhythm. No murmur, rub, or gallop. UTD JVD, + pedal edema. GI: Abdomen soft, non-tender, non-distended, with normoactive bowel sounds. No organomegaly or masses felt. Ext: Warm, no deformities Skin: No rashes, lesions or ulcers Neuro: Alert and oriented. No focal neurological deficits. Psych: Judgement and insight appear normal. Mood & affect appropriate.   Data Reviewed: I have personally reviewed following labs and imaging studies  CBC: Recent Labs  Lab 08/05/21 1544  WBC 10.3  NEUTROABS 8.6*  HGB 13.4  HCT 41.1  MCV 90.9  PLT 294   Basic Metabolic Panel: Recent Labs  Lab 08/05/21 1544 08/06/21 0322  NA 137 137  K 3.2* 3.7  CL 99 101  CO2 26 25  GLUCOSE 175* 98  BUN 15 21  CREATININE 1.43* 1.55*  CALCIUM 8.6* 8.7*  MG  --  2.4   GFR: Estimated Creatinine Clearance: 45.5 mL/min (A) (by C-G formula based on SCr of 1.55 mg/dL (H)). Liver Function Tests: No results for input(s): AST, ALT, ALKPHOS, BILITOT, PROT, ALBUMIN in the last 168 hours. No results for input(s): LIPASE,  AMYLASE in the last 168 hours. No results for input(s): AMMONIA in the last 168 hours. Coagulation Profile: No results for input(s): INR, PROTIME in the last 168 hours. Cardiac Enzymes: No results for input(s): CKTOTAL, CKMB, CKMBINDEX, TROPONINI in the last 168 hours. BNP (last 3 results) No results for input(s): PROBNP in the last 8760 hours. HbA1C: No results for input(s): HGBA1C in the last 72 hours. CBG: Recent Labs  Lab 08/06/21 0000 08/06/21 0753 08/06/21 1129  GLUCAP 186* 105* 104*   Lipid Profile: No results for input(s): CHOL, HDL, LDLCALC, TRIG, CHOLHDL, LDLDIRECT in the last 72 hours. Thyroid Function Tests: No results for input(s): TSH, T4TOTAL, FREET4, T3FREE, THYROIDAB in the last 72 hours. Anemia Panel: No results for input(s): VITAMINB12, FOLATE, FERRITIN, TIBC, IRON, RETICCTPCT in the last 72 hours. Urine analysis:    Component Value Date/Time   COLORURINE YELLOW 01/01/2021 1351   APPEARANCEUR CLEAR 01/01/2021 1351   LABSPEC 1.009 01/01/2021 1351   PHURINE 6.0 01/01/2021 1351   GLUCOSEU >=500 (A) 01/01/2021 1351   HGBUR SMALL (A) 01/01/2021 1351   BILIRUBINUR NEGATIVE 01/01/2021 1351   KETONESUR NEGATIVE 01/01/2021 1351   PROTEINUR >=300 (A) 01/01/2021 1351   UROBILINOGEN 1.0 05/31/2013 1432   NITRITE NEGATIVE 01/01/2021 1351   LEUKOCYTESUR NEGATIVE 01/01/2021 1351   Recent Results (from the past 240 hour(s))  Resp Panel by RT-PCR (Flu A&B, Covid) Nasopharyngeal Swab     Status: None   Collection Time: 08/05/21  5:31 PM   Specimen: Nasopharyngeal Swab; Nasopharyngeal(NP) swabs in vial transport medium  Result Value Ref Range Status   SARS Coronavirus 2 by RT PCR NEGATIVE NEGATIVE Final    Comment: (NOTE) SARS-CoV-2 target nucleic acids are NOT DETECTED.  The SARS-CoV-2 RNA is generally detectable in upper respiratory specimens during the acute phase of infection. The lowest concentration of SARS-CoV-2 viral copies this assay can detect is 138  copies/mL. A negative result does not preclude SARS-Cov-2 infection and should not be used as the sole basis for treatment or other patient management decisions. A negative result may occur with  improper specimen collection/handling, submission of specimen other than nasopharyngeal swab, presence of viral mutation(s) within the areas targeted by this assay, and inadequate number of viral copies(<138 copies/mL). A negative result must be combined with clinical observations, patient history, and epidemiological information. The expected result is Negative.  Fact Sheet for Patients:  BloggerCourse.com  Fact Sheet for Healthcare Providers:  SeriousBroker.it  This test is no t yet approved or cleared by the [place] FDA and  has been authorized for detection and/or diagnosis of SARS-CoV-2 by FDA under an Emergency Use Authorization (EUA). This EUA will remain  in effect (meaning this test can be used) for the duration of the COVID-19 declaration under Section 564(b)(1) of the Act, 21 U.S.C.section 360bbb-3(b)(1), unless the authorization is terminated  or revoked sooner.       Influenza A by PCR NEGATIVE NEGATIVE Final   Influenza B by PCR NEGATIVE NEGATIVE Final    Comment: (NOTE) The Xpert  Xpress SARS-CoV-2/FLU/RSV plus assay is intended as an aid in the diagnosis of influenza from Nasopharyngeal swab specimens and should not be used as a sole basis for treatment. Nasal washings and aspirates are unacceptable for Xpert Xpress SARS-CoV-2/FLU/RSV testing.  Fact Sheet for Patients: EntrepreneurPulse.com.au  Fact Sheet for Healthcare Providers: IncredibleEmployment.be  This test is not yet approved or cleared by the [place] FDA and has been authorized for detection and/or diagnosis of SARS-CoV-2 by FDA under an Emergency Use Authorization (EUA). This EUA will remain in effect (meaning  this test can be used) for the duration of the COVID-19 declaration under Section 564(b)(1) of the Act, 21 U.S.C. section 360bbb-3(b)(1), unless the authorization is terminated or revoked.  Performed at [facility], [place] [address], [place], [place] [address]       Radiology Studies: DG Chest 2 View  Result Date: 08/06/2021 CLINICAL DATA:  80 year old male with history of shortness of breath. EXAM: CHEST - 2 VIEW COMPARISON:  Chest x-ray 08/05/2021. FINDINGS: Lung volumes are normal. Bibasilar opacities which may reflect areas of atelectasis and/or consolidation. Small bilateral pleural effusions. Cephalization of the pulmonary vasculature, without frank pulmonary edema. Heart size is borderline enlarged. Upper mediastinal contours are within normal limits. Atherosclerotic calcifications are noted in the thoracic aorta. Spinal cord stimulator projecting over the midthoracic region. IMPRESSION: 1. Small bilateral pleural effusions with areas of bibasilar atelectasis and/or consolidation. 2. Cephalization of the pulmonary vasculature, without frank pulmonary edema. 3. Aortic atherosclerosis. Electronically Signed   By: Vinnie Langton M.D.   On: 08/06/2021 07:03   DG Chest 2 View  Result Date: 08/05/2021 CLINICAL DATA:  Shortness of breath EXAM: CHEST - 2 VIEW COMPARISON:  Radiograph 06/06/2021 FINDINGS: Unchanged size of the cardiomediastinal silhouette. There is bibasilar airspace disease and small to moderate size bilateral pleural effusions. No visible pneumothorax. Mild central pulmonary vascular congestion. No acute osseous abnormality. Spinal cord stimulator lead overlies the mid chest. IMPRESSION: Small to moderate-sized bilateral pleural effusions with bibasilar airspace disease, favored to be atelectasis. Mild central pulmonary vascular congestion/mild edema. Electronically Signed   By: Maurine Simmering M.D.   On: 08/05/2021 16:33    Scheduled Meds:  apixaban  5 mg Oral BID    atorvastatin  40 mg Oral QHS   clopidogrel  75 mg Oral Daily   dapagliflozin propanediol  10 mg Oral QAC breakfast   escitalopram  20 mg Oral Daily   furosemide  40 mg Intravenous BID   glipiZIDE  5 mg Oral QAC breakfast   insulin aspart  0-9 Units Subcutaneous TID WC   isosorbide mononitrate  60 mg Oral Daily   metoprolol tartrate  25 mg Oral BID   pantoprazole  40 mg Oral Daily   potassium chloride  40 mEq Oral Once   [START ON 08/07/2021] potassium chloride  40 mEq Oral Daily   sacubitril-valsartan  1 tablet Oral BID   sodium chloride flush  3 mL Intravenous Q12H   spironolactone  12.5 mg Oral Daily   Continuous Infusions:  sodium chloride       LOS: 1 day   Time spent: 25 minutes.  Patrecia Pour, MD [facility] [email or web address] 08/06/2021, 4:17 PM

## 2021-08-07 DIAGNOSIS — I5033 Acute on chronic diastolic (congestive) heart failure: Secondary | ICD-10-CM | POA: Diagnosis not present

## 2021-08-07 DIAGNOSIS — I2510 Atherosclerotic heart disease of native coronary artery without angina pectoris: Secondary | ICD-10-CM | POA: Diagnosis not present

## 2021-08-07 DIAGNOSIS — I739 Peripheral vascular disease, unspecified: Secondary | ICD-10-CM | POA: Diagnosis not present

## 2021-08-07 DIAGNOSIS — I160 Hypertensive urgency: Secondary | ICD-10-CM | POA: Diagnosis not present

## 2021-08-07 LAB — BASIC METABOLIC PANEL
Anion gap: 7 (ref 5–15)
BUN: 23 mg/dL (ref 8–23)
CO2: 26 mmol/L (ref 22–32)
Calcium: 8.4 mg/dL — ABNORMAL LOW (ref 8.9–10.3)
Chloride: 105 mmol/L (ref 98–111)
Creatinine, Ser: 1.62 mg/dL — ABNORMAL HIGH (ref 0.61–1.24)
GFR, Estimated: 43 mL/min — ABNORMAL LOW (ref >60)
Glucose, Bld: 119 mg/dL — ABNORMAL HIGH (ref 70–99)
Potassium: 3.9 mmol/L (ref 3.5–5.1)
Sodium: 138 mmol/L (ref 135–145)

## 2021-08-07 LAB — GLUCOSE, CAPILLARY
Glucose-Capillary: 101 mg/dL — ABNORMAL HIGH (ref 70–99)
Glucose-Capillary: 113 mg/dL — ABNORMAL HIGH (ref 70–99)
Glucose-Capillary: 151 mg/dL — ABNORMAL HIGH (ref 70–99)
Glucose-Capillary: 140 mg/dL — ABNORMAL HIGH (ref 70–99)

## 2021-08-07 LAB — TSH: TSH: 6.637 u[IU]/mL — ABNORMAL HIGH (ref 0.350–4.500)

## 2021-08-07 MED ORDER — FUROSEMIDE 10 MG/ML IJ SOLN
80.0000 mg | Freq: Two times a day (BID) | INTRAMUSCULAR | Status: DC
  Administered 2021-08-07 – 2021-08-08 (×3): 80 mg via INTRAVENOUS
  Filled 2021-08-07 (×2): qty 8

## 2021-08-07 MED ORDER — FUROSEMIDE 10 MG/ML IJ SOLN
80.0000 mg | Freq: Two times a day (BID) | INTRAMUSCULAR | Status: DC
  Filled 2021-08-07: qty 8

## 2021-08-07 NOTE — Progress Notes (Signed)
Mobility Specialist Progress Note    08/07/21 1726  Mobility  Activity Ambulated in hall  Level of Assistance Modified independent, requires aide device or extra time  Assistive Device Front wheel walker  Distance Ambulated (ft) 230 ft  Mobility Ambulated independently in hallway  Mobility Response Tolerated well  Mobility performed by Mobility specialist  $Mobility charge 1 Mobility   Pt received sitting EOB and agreeable. Noticed some SOB towards end of walk. Returned to bed with call bell in reach.   [facility] Mobility Specialist  M.S. Primary Phone: [phone] M.S. Secondary Phone: [phone]

## 2021-08-07 NOTE — Evaluation (Signed)
Occupational Therapy Evaluation Patient Details Name: Albert Powell MRN: [id number] DOB: 19-Feb-1941 Today's Date: 08/07/2021   History of Present Illness Pt is a 80 y/o male admitted secondary to increased SOB. Thought to be secondary to CHF exacerbation. PMH includes dCHF, a fib, DM, CKD, COPD, and HTN.   Clinical Impression   Pt was ambulating at times with a rollator. He alternates between sitting and standing in the shower, but finds this activity to be tiresome. Pt's wife does housekeeping and meal prep. Pt presents with decreased activity tolerance, but is overall functioning at a set up to supervision level. He demonstrated ability to ambulate in room with supervision and no device with Sp02 of 96% on RA. Do not anticipate pt will require post acute OT.      Recommendations for follow up therapy are one component of a multi-disciplinary discharge planning process, led by the attending physician.  Recommendations may be updated based on patient status, additional functional criteria and insurance authorization.   Follow Up Recommendations  No OT follow up    Assistance Recommended at Discharge    Functional Status Assessment  Patient has had a recent decline in their functional status and demonstrates the ability to make significant improvements in function in a reasonable and predictable amount of time.  Equipment Recommendations  None recommended by OT    Recommendations for Other Services       Precautions / Restrictions Precautions Precautions: Fall      Mobility Bed Mobility Overal bed mobility: Modified Independent             General bed mobility comments: no physical assist to return to supine    Transfers Overall transfer level: Needs assistance Equipment used: None Transfers: Sit to/from Stand Sit to Stand: Supervision                  Balance Overall balance assessment: Needs assistance   Sitting balance-Leahy Scale: Good Sitting  balance - Comments: no LOB donning socks   Standing balance support: No upper extremity supported Standing balance-Leahy Scale: Fair Standing balance comment: ambulating from bathroom to bed with supervision and fairly steady without UE support                           ADL either performed or assessed with clinical judgement   ADL Overall ADL's : Needs assistance/impaired Eating/Feeding: Independent;Sitting   Grooming: Wash/dry hands;Standing;Supervision/safety   Upper Body Bathing: Set up;Sitting   Lower Body Bathing: Supervison/ safety;Sit to/from stand   Upper Body Dressing : Set up;Sitting   Lower Body Dressing: Sit to/from stand;Supervision/safety Lower Body Dressing Details (indicate cue type and reason): able to cross foot over opposite knee Toilet Transfer: Supervision/safety;Ambulation   Toileting- Clothing Manipulation and Hygiene: Supervision/safety;Sit to/from stand       Functional mobility during ADLs: Supervision/safety       Vision Ability to See in Adequate Light: 0 Adequate Patient Visual Report: No change from baseline       Perception     Praxis      Pertinent Vitals/Pain Pain Assessment: No/denies pain     Hand Dominance Right   Extremity/Trunk Assessment Upper Extremity Assessment Upper Extremity Assessment: Overall WFL for tasks assessed   Lower Extremity Assessment Lower Extremity Assessment: Defer to PT evaluation   Cervical / Trunk Assessment Cervical / Trunk Assessment: Normal (obesity)   Communication Communication Communication: No difficulties   Cognition Arousal/Alertness: Awake/alert Behavior During Therapy: [facility]  for tasks assessed/performed Overall Cognitive Status: Within Functional Limits for tasks assessed                                       General Comments       Exercises     Shoulder Instructions      Home Living Family/patient expects to be discharged to:: Private  residence Living Arrangements: Spouse/significant other Available Help at Discharge: Family;Available 24 hours/day Type of Home: Apartment Home Access: Level entry     Home Layout: One level     Bathroom Shower/Tub: Chief Strategy Officer: Standard     Home Equipment: Scientist, research (medical) (4 wheels);Shower seat;Cane - single point   Additional Comments: wife is being treated for lung cancer      Prior Functioning/Environment Prior Level of Function : Needs assist             Mobility Comments: Uses rollator for mobility vs no AD ADLs Comments: modified independent for self care, sits and stands for showering, reports showering is fatiguing, wife does cooking and cleaning        OT Problem List: Decreased activity tolerance      OT Treatment/Interventions: Energy conservation;Self-care/ADL training    OT Goals(Current goals can be found in the care plan section) Acute Rehab OT Goals OT Goal Formulation: With patient Time For Goal Achievement: 08/21/21 Potential to Achieve Goals: Good ADL Goals Pt Will Perform Grooming: with modified independence;standing (3 activities) Pt Will Transfer to Toilet: with modified independence;ambulating;regular height toilet Pt Will Perform Toileting - Clothing Manipulation and hygiene: with modified independence;sit to/from stand Additional ADL Goal #1: Pt will employ energy conservation strategies in ADL and mobility. Additional ADL Goal #2: Pt will gather items necessary for ADL modified independently.  OT Frequency: Min 2X/week   Barriers to D/C:            Co-evaluation              AM-PAC OT "6 Clicks" Daily Activity     Outcome Measure Help from another person eating meals?: None Help from another person taking care of personal grooming?: A Little Help from another person toileting, which includes using toliet, bedpan, or urinal?: A Little Help from another person bathing (including washing,  rinsing, drying)?: None Help from another person to put on and taking off regular upper body clothing?: A Little   6 Click Score: 17   End of Session Nurse Communication: Mobility status  Activity Tolerance: Patient tolerated treatment well Patient left: in bed;with call bell/phone within reach;with family/visitor present  OT Visit Diagnosis: Other (comment) (decreased activity tolerance)                Time: 1415-1435 OT Time Calculation (min): 20 min Charges:  OT General Charges $OT Visit: 1 Visit OT Evaluation $OT Eval Low Complexity: 1 Low  Martie Round, OTR/L Acute Rehabilitation Services Pager: [phone] Office: [phone]  Evern Bio 08/07/2021, 3:40 PM

## 2021-08-07 NOTE — Progress Notes (Signed)
Progress Note  Patient Name: Albert Powell Date of Encounter: 08/07/2021  [facility] Cardiologist: Nanetta Batty, MD   Subjective   Feeling better but breathing not at baseline.  Feeling weak and tired.   Inpatient Medications    Scheduled Meds:  apixaban  5 mg Oral BID   atorvastatin  40 mg Oral QHS   clopidogrel  75 mg Oral Daily   dapagliflozin propanediol  10 mg Oral QAC breakfast   escitalopram  20 mg Oral Daily   furosemide  40 mg Intravenous BID   insulin aspart  0-9 Units Subcutaneous TID WC   isosorbide mononitrate  120 mg Oral Daily   metoprolol tartrate  25 mg Oral BID   pantoprazole  40 mg Oral Daily   potassium chloride  40 mEq Oral Daily   sacubitril-valsartan  1 tablet Oral BID   sodium chloride flush  3 mL Intravenous Q12H   spironolactone  25 mg Oral Daily   Continuous Infusions:  sodium chloride     PRN Meds: sodium chloride, acetaminophen, ondansetron (ZOFRAN) IV, sodium chloride flush   Vital Signs    Vitals:   08/06/21 2257 08/07/21 0310 08/07/21 0500 08/07/21 0807  BP: (!) 146/77 (!) 145/82  (!) 181/59  Pulse: 63 64  66  Resp: 20 (!) 22  (!) 22  Temp: 97.9 F (36.6 C) 97.9 F (36.6 C)  97.8 F (36.6 C)  TempSrc: Oral Oral  Oral  SpO2: 92% 97%  96%  Weight:   100.9 kg   Height:   5' 9.5" (1.765 m)     Intake/Output Summary (Last 24 hours) at 08/07/2021 0841 Last data filed at 08/07/2021 0809 Gross per 24 hour  Intake 843 ml  Output 1675 ml  Net -832 ml   Last 3 Weights 08/07/2021 08/06/2021 08/05/2021  Weight (lbs) 222 lb 7.1 oz 224 lb 13.9 oz 229 lb 8 oz  Weight (kg) 100.9 kg 102 kg 104.1 kg      Telemetry    Atrial fibrillation.  Rate ~60 bpm - Personally Reviewed  ECG    N/a - Personally Reviewed  Physical Exam   VS:  BP (!) 181/59   Pulse 66   Temp 97.8 F (36.6 C) (Oral)   Resp (!) 22   Ht 5' 9.5" (1.765 m)   Wt 100.9 kg Comment: scale A  SpO2 96%   BMI 32.38 kg/m  , BMI Body mass index is 32.38  kg/m. GENERAL:  Well appearing HEENT: Pupils equal round and reactive, fundi not visualized, oral mucosa unremarkable NECK:  No jugular venous distention, waveform within normal limits, carotid upstroke brisk and symmetric, no bruits, no thyromegaly LUNGS:  Clear to auscultation bilaterally HEART:  Irregularly irregular.  PMI not displaced or sustained,S1 and S2 within normal limits, no S3, no S4, no clicks, no rubs, II/VI systolic murmur at the LUSB ABD:  Central adiposity.  Positive bowel sounds normal in frequency in pitch, no bruits, no rebound, no guarding, no midline pulsatile mass, no hepatomegaly, no splenomegaly EXT:  2 plus pulses throughout, trace edema, no cyanosis no clubbing SKIN:  No rashes no nodules NEURO:  Cranial nerves II through XII grossly intact, motor grossly intact throughout PSYCH:  Cognitively intact, oriented to person place and time   Labs    High Sensitivity Troponin:   Recent Labs  Lab 08/05/21 1726 08/05/21 2151  TROPONINIHS 101* 91*     Chemistry Recent Labs  Lab 08/05/21 1544 08/06/21 0322 08/07/21 0302  NA 137 137 138  K 3.2* 3.7 3.9  CL 99 101 105  CO2 26 25 26   GLUCOSE 175* 98 119*  BUN 15 21 23   CREATININE 1.43* 1.55* 1.62*  CALCIUM 8.6* 8.7* 8.4*  MG  --  2.4  --   GFRNONAA 50* 45* 43*  ANIONGAP 12 11 7     Lipids No results for input(s): CHOL, TRIG, HDL, LABVLDL, LDLCALC, CHOLHDL in the last 168 hours.  Hematology Recent Labs  Lab 08/05/21 1544  WBC 10.3  RBC 4.52  HGB 13.4  HCT 41.1  MCV 90.9  MCH 29.6  MCHC 32.6  RDW 14.6  PLT 294   Thyroid  Recent Labs  Lab 08/07/21 0302  TSH 6.637*    BNP Recent Labs  Lab 08/05/21 1544  BNP >4,500.0*    DDimer No results for input(s): DDIMER in the last 168 hours.   Radiology    DG Chest 2 View  Result Date: 08/06/2021 CLINICAL DATA:  80 year old male with history of shortness of breath. EXAM: CHEST - 2 VIEW COMPARISON:  Chest x-ray 08/05/2021. FINDINGS: Lung volumes  are normal. Bibasilar opacities which may reflect areas of atelectasis and/or consolidation. Small bilateral pleural effusions. Cephalization of the pulmonary vasculature, without frank pulmonary edema. Heart size is borderline enlarged. Upper mediastinal contours are within normal limits. Atherosclerotic calcifications are noted in the thoracic aorta. Spinal cord stimulator projecting over the midthoracic region. IMPRESSION: 1. Small bilateral pleural effusions with areas of bibasilar atelectasis and/or consolidation. 2. Cephalization of the pulmonary vasculature, without frank pulmonary edema. 3. Aortic atherosclerosis. Electronically Signed   By: Vinnie Langton M.D.   On: 08/06/2021 07:03   DG Chest 2 View  Result Date: 08/05/2021 CLINICAL DATA:  Shortness of breath EXAM: CHEST - 2 VIEW COMPARISON:  Radiograph 06/06/2021 FINDINGS: Unchanged size of the cardiomediastinal silhouette. There is bibasilar airspace disease and small to moderate size bilateral pleural effusions. No visible pneumothorax. Mild central pulmonary vascular congestion. No acute osseous abnormality. Spinal cord stimulator lead overlies the mid chest. IMPRESSION: Small to moderate-sized bilateral pleural effusions with bibasilar airspace disease, favored to be atelectasis. Mild central pulmonary vascular congestion/mild edema. Electronically Signed   By: Maurine Simmering M.D.   On: 08/05/2021 16:33    Cardiac Studies   Echo 05/2021: 1. Left ventricular ejection fraction, by estimation, is 60 to 65%. The  left ventricle has normal function. The left ventricle has no regional  wall motion abnormalities. There is mild concentric left ventricular  hypertrophy. Left ventricular diastolic  parameters are indeterminate. Elevated left ventricular end-diastolic  pressure.   2. Right ventricular systolic function is normal. The right ventricular  size is normal. Tricuspid regurgitation signal is inadequate for assessing  PA pressure.   3.  A small pericardial effusion is present. The pericardial effusion is  posterior to the left ventricle.   4. The mitral valve is grossly normal. No evidence of mitral valve  regurgitation. No evidence of mitral stenosis.   5. The aortic valve is grossly normal. Aortic valve regurgitation is not  visualized. Mild aortic valve sclerosis is present, with no evidence of  aortic valve stenosis.   6. The inferior vena cava is normal in size with greater than 50%  respiratory variability, suggesting right atrial pressure of 3 mmHg.   Patient Profile     Albert Powell is a 65M with CAD status post RCA PCI and otherwise medically managed nonobstructive disease, hypertension, hyperlipidemia, diabetes, PAD, prior tobacco use (current vaping) and chronic  diastolic heart failure admitted with acute on chronic diastolic heart failure  Assessment & Plan    # Acute on chronic diastolic heart failure:  # Hypertension:  BNP >4,500 on admission.  He was only net -1L yesterday and -800 since admission.  Renal function is worse, but he is still volume overloaded.  We will increase lasix to 80mg  bid and continue diuresis.  Continue Entresto and metoprolol.  He should not be on losartan.  Increased spironolactone and Imdur today.  If BP remains elevated we will need to add amlodipine or hydralazine.   # CAD s/p PCI: # PAD:  # Hyperlipidemia:  Not an active issue. Continue clopidogrel, Imdur, atorvastatin and metoprolol.  # PAF: Rate controlled. Continue metoprolol and Eliquis.       For questions or updates, please contact [place] Please consult [email or web address] for contact info under        Signed, Skeet Latch, MD  08/07/2021, 8:41 AM

## 2021-08-07 NOTE — Progress Notes (Addendum)
PROGRESS NOTE  RAVINDER LUKEHART  RXV:[id number] DOB: 09-02-1941 DOA: 08/05/2021 PCP: [name], MD    Brief Narrative:  Albert Powell is a 80 y.o. male with past medical history of paroxysmal atrial fibrillation on Eliquis, CAD, chronic diastolic heart failure, type 2 diabetes, CKD stage IIIa presented to hospital with increasing shortness of breath and leg swelling.  In the ED patient was noted to have bilateral pleural effusion and pulmonary vascular congestion.  Lasix was given and patient was admitted hospital for further treatment.    Assessment & Plan: Principal Problem:   CHF (congestive heart failure) ([name]) Active Problems:   CAD in native artery   PAD (peripheral artery disease) ([name])   Acute on chronic diastolic heart failure ([name])   Hypertensive urgency  Acute on chronic HFpEF, HTN:  Thought to be secondary to insufficient adherence to Lasix.  Patient with recurrent CHF admissions.  Lasix 80 mg twice a day.  Seen by cardiology.  Continue intake and output charting, daily weights.  Continue spironolactone, metoprolol, Entresto and imdur.     CAD hx stents with demand myocardial ischemia:  Cardiology on board.  On medication.  On Eliquis.  Stage IIIa CKD:  Continue to monitor BMP on diuretics.  Diabetes mellitus type 2.:  Latest HbA1c 7.1%.  Continue sliding scale insulin.  Latest POC glucose of 113.  Continue to hold semaglutide and glipizide, continue dapagliflozin.   Paroxysmal atrial fibrillation  Continue Eliquis, metoprolol.   PAD:  Continue statins Plavix and anticoagulation.     Depression:  on SSRI  Obesity:  Patient would benefit from weight loss as outpatient  DVT prophylaxis: Eliquis  Code Status: DNR  Family Communication:  Not at bedside.  Disposition Plan: Physical therapy recommends home health PT on discharge  Status is: Inpatient  Remains inpatient appropriate because: IV diuresis  Consultants:   Cardiology  Procedures:  2 D Echo  Antimicrobials: None   Subjective: Today, patient complains of feeling little better with breathing.  Has mild cough.  Has ambulated to the bathroom.  Denies chest pain.  Objective:  Vitals:   08/07/21 0310 08/07/21 0500 08/07/21 0807 08/07/21 1133  BP: (!) 145/82  (!) 181/59 (!) 151/75  Pulse: 64  66 63  Resp: (!) 22  (!) 22 16  Temp: 97.9 F (36.6 C)  97.8 F (36.6 C) 97.8 F (36.6 C)  TempSrc: Oral  Oral Oral  SpO2: 97%  96% 94%  Weight:  100.9 kg    Height:  5' 9.5" (1.765 m)      Intake/Output Summary (Last 24 hours) at 08/07/2021 1150 Last data filed at 08/07/2021 0809 Gross per 24 hour  Intake 843 ml  Output 1425 ml  Net -582 ml    Filed Weights   08/05/21 2000 08/06/21 1024 08/07/21 0500  Weight: 104.1 kg 102 kg 100.9 kg   General:  Average built, not in obvious distress HENT:   No scleral pallor or icterus noted. Oral mucosa is moist.  Chest:   Diminished breath sounds bilaterally. No crackles or wheezes.  CVS: S1 &S2 heard.  Systolic murmur noted irregularly irregular rhythm Abdomen: Soft, nontender, nondistended.  Bowel sounds are heard.   Extremities: No cyanosis, clubbing with bilateral lower extremity edema peripheral pulses are palpable. Psych: Alert, awake and oriented, normal mood CNS:  No cranial nerve deficits.  Power equal in all extremities.   Skin: Warm and dry.  No rashes noted.  Data Reviewed: I have personally reviewed following labs and  imaging studies  CBC: Recent Labs  Lab 08/05/21 1544  WBC 10.3  NEUTROABS 8.6*  HGB 13.4  HCT 41.1  MCV 90.9  PLT [id number]    Basic Metabolic Panel: Recent Labs  Lab 08/05/21 1544 08/06/21 0322 08/07/21 0302  NA 137 137 138  K 3.2* 3.7 3.9  CL 99 101 105  CO2 26 25 26   GLUCOSE 175* 98 119*  BUN 15 21 23   CREATININE 1.43* 1.55* 1.62*  CALCIUM 8.6* 8.7* 8.4*  MG  --  2.4  --     GFR: Estimated Creatinine Clearance: 43.7 mL/min (A) (by C-G formula  based on SCr of 1.62 mg/dL (H)). Liver Function Tests: No results for input(s): AST, ALT, ALKPHOS, BILITOT, PROT, ALBUMIN in the last 168 hours. No results for input(s): LIPASE, AMYLASE in the last 168 hours. No results for input(s): AMMONIA in the last 168 hours. Coagulation Profile: No results for input(s): INR, PROTIME in the last 168 hours. Cardiac Enzymes: No results for input(s): CKTOTAL, CKMB, CKMBINDEX, TROPONINI in the last 168 hours. BNP (last 3 results) No results for input(s): PROBNP in the last 8760 hours. HbA1C: No results for input(s): HGBA1C in the last 72 hours. CBG: Recent Labs  Lab 08/06/21 1129 08/06/21 1623 08/06/21 2105 08/07/21 0550 08/07/21 1132  GLUCAP 104* 103* 160* 101* 113*    Lipid Profile: No results for input(s): CHOL, HDL, LDLCALC, TRIG, CHOLHDL, LDLDIRECT in the last 72 hours. Thyroid Function Tests: Recent Labs    08/07/21 0302  TSH 6.637*   Anemia Panel: No results for input(s): VITAMINB12, FOLATE, FERRITIN, TIBC, IRON, RETICCTPCT in the last 72 hours. Urine analysis:    Component Value Date/Time   COLORURINE YELLOW 01/01/2021 1351   APPEARANCEUR CLEAR 01/01/2021 1351   LABSPEC 1.009 01/01/2021 1351   PHURINE 6.0 01/01/2021 1351   GLUCOSEU >=500 (A) 01/01/2021 1351   HGBUR SMALL (A) 01/01/2021 1351   BILIRUBINUR NEGATIVE 01/01/2021 1351   KETONESUR NEGATIVE 01/01/2021 1351   PROTEINUR >=300 (A) 01/01/2021 1351   UROBILINOGEN 1.0 05/31/2013 1432   NITRITE NEGATIVE 01/01/2021 1351   LEUKOCYTESUR NEGATIVE 01/01/2021 1351   Recent Results (from the past 240 hour(s))  Resp Panel by RT-PCR (Flu A&B, Covid) Nasopharyngeal Swab     Status: None   Collection Time: 08/05/21  5:31 PM   Specimen: Nasopharyngeal Swab; Nasopharyngeal(NP) swabs in vial transport medium  Result Value Ref Range Status   SARS Coronavirus 2 by RT PCR NEGATIVE NEGATIVE Final    Comment: (NOTE) SARS-CoV-2 target nucleic acids are NOT DETECTED.  The SARS-CoV-2  RNA is generally detectable in upper respiratory specimens during the acute phase of infection. The lowest concentration of SARS-CoV-2 viral copies this assay can detect is 138 copies/mL. A negative result does not preclude SARS-Cov-2 infection and should not be used as the sole basis for treatment or other patient management decisions. A negative result may occur with  improper specimen collection/handling, submission of specimen other than nasopharyngeal swab, presence of viral mutation(s) within the areas targeted by this assay, and inadequate number of viral copies(<138 copies/mL). A negative result must be combined with clinical observations, patient history, and epidemiological information. The expected result is Negative.  Fact Sheet for Patients:  EntrepreneurPulse.com.au  Fact Sheet for Healthcare Providers:  IncredibleEmployment.be  This test is no t yet approved or cleared by the [place] FDA and  has been authorized for detection and/or diagnosis of SARS-CoV-2 by FDA under an Emergency Use Authorization (EUA). This EUA will remain  in effect (meaning this test can be used) for the duration of the COVID-19 declaration under Section 564(b)(1) of the Act, 21 U.S.C.section 360bbb-3(b)(1), unless the authorization is terminated  or revoked sooner.       Influenza A by PCR NEGATIVE NEGATIVE Final   Influenza B by PCR NEGATIVE NEGATIVE Final    Comment: (NOTE) The Xpert Xpress SARS-CoV-2/FLU/RSV plus assay is intended as an aid in the diagnosis of influenza from Nasopharyngeal swab specimens and should not be used as a sole basis for treatment. Nasal washings and aspirates are unacceptable for Xpert Xpress SARS-CoV-2/FLU/RSV testing.  Fact Sheet for Patients: EntrepreneurPulse.com.au  Fact Sheet for Healthcare Providers: IncredibleEmployment.be  This test is not yet approved or cleared by the  [place] FDA and has been authorized for detection and/or diagnosis of SARS-CoV-2 by FDA under an Emergency Use Authorization (EUA). This EUA will remain in effect (meaning this test can be used) for the duration of the COVID-19 declaration under Section 564(b)(1) of the Act, 21 U.S.C. section 360bbb-3(b)(1), unless the authorization is terminated or revoked.  Performed at [facility], [place] [address] [place], [place] [address]   MRSA Next Gen by PCR, Nasal     Status: None   Collection Time: 08/06/21  9:38 PM   Specimen: Nasal Mucosa; Nasal Swab  Result Value Ref Range Status   MRSA by PCR Next Gen NOT DETECTED NOT DETECTED Final    Comment: (NOTE) The GeneXpert MRSA Assay (FDA approved for NASAL specimens only), is one component of a comprehensive MRSA colonization surveillance program. It is not intended to diagnose MRSA infection nor to guide or monitor treatment for MRSA infections. Test performance is not FDA approved in patients less than 29 years old. Performed at [facility], [place] [address], [place],  [address]       Radiology Studies: DG Chest 2 View  Result Date: 08/06/2021 CLINICAL DATA:  80 year old male with history of shortness of breath. EXAM: CHEST - 2 VIEW COMPARISON:  Chest x-ray 08/05/2021. FINDINGS: Lung volumes are normal. Bibasilar opacities which may reflect areas of atelectasis and/or consolidation. Small bilateral pleural effusions. Cephalization of the pulmonary vasculature, without frank pulmonary edema. Heart size is borderline enlarged. Upper mediastinal contours are within normal limits. Atherosclerotic calcifications are noted in the thoracic aorta. Spinal cord stimulator projecting over the midthoracic region. IMPRESSION: 1. Small bilateral pleural effusions with areas of bibasilar atelectasis and/or consolidation. 2. Cephalization of the pulmonary vasculature, without frank pulmonary edema. 3. Aortic atherosclerosis.  Electronically Signed   By: Vinnie Langton M.D.   On: 08/06/2021 07:03   DG Chest 2 View  Result Date: 08/05/2021 CLINICAL DATA:  Shortness of breath EXAM: CHEST - 2 VIEW COMPARISON:  Radiograph 06/06/2021 FINDINGS: Unchanged size of the cardiomediastinal silhouette. There is bibasilar airspace disease and small to moderate size bilateral pleural effusions. No visible pneumothorax. Mild central pulmonary vascular congestion. No acute osseous abnormality. Spinal cord stimulator lead overlies the mid chest. IMPRESSION: Small to moderate-sized bilateral pleural effusions with bibasilar airspace disease, favored to be atelectasis. Mild central pulmonary vascular congestion/mild edema. Electronically Signed   By: Maurine Simmering M.D.   On: 08/05/2021 16:33    Scheduled Meds:  apixaban  5 mg Oral BID   atorvastatin  40 mg Oral QHS   clopidogrel  75 mg Oral Daily   dapagliflozin propanediol  10 mg Oral QAC breakfast   escitalopram  20 mg Oral Daily   furosemide  80 mg Intravenous  BID   insulin aspart  0-9 Units Subcutaneous TID WC   isosorbide mononitrate  120 mg Oral Daily   metoprolol tartrate  25 mg Oral BID   pantoprazole  40 mg Oral Daily   potassium chloride  40 mEq Oral Daily   sacubitril-valsartan  1 tablet Oral BID   sodium chloride flush  3 mL Intravenous Q12H   spironolactone  25 mg Oral Daily   Continuous Infusions:  sodium chloride       LOS: 2 days    Flora Lipps, MD [facility] 08/07/2021, 11:50 AM

## 2021-08-07 NOTE — Care Management Important Message (Signed)
Important Message  Patient Details  Name: Albert Powell MRN: [id number] Date of Birth: [birth year]   Medicare Important Message Given:  Yes     Silvina Hackleman Stefan Church 08/07/2021, 3:40 PM

## 2021-08-08 DIAGNOSIS — I2510 Atherosclerotic heart disease of native coronary artery without angina pectoris: Secondary | ICD-10-CM | POA: Diagnosis not present

## 2021-08-08 DIAGNOSIS — I160 Hypertensive urgency: Secondary | ICD-10-CM | POA: Diagnosis not present

## 2021-08-08 DIAGNOSIS — I739 Peripheral vascular disease, unspecified: Secondary | ICD-10-CM | POA: Diagnosis not present

## 2021-08-08 DIAGNOSIS — I5033 Acute on chronic diastolic (congestive) heart failure: Secondary | ICD-10-CM | POA: Diagnosis not present

## 2021-08-08 LAB — GLUCOSE, CAPILLARY
Glucose-Capillary: 113 mg/dL — ABNORMAL HIGH (ref 70–99)
Glucose-Capillary: 141 mg/dL — ABNORMAL HIGH (ref 70–99)
Glucose-Capillary: 128 mg/dL — ABNORMAL HIGH (ref 70–99)
Glucose-Capillary: 114 mg/dL — ABNORMAL HIGH (ref 70–99)

## 2021-08-08 LAB — BASIC METABOLIC PANEL
Anion gap: 7 (ref 5–15)
BUN: 26 mg/dL — ABNORMAL HIGH (ref 8–23)
CO2: 29 mmol/L (ref 22–32)
Calcium: 8.4 mg/dL — ABNORMAL LOW (ref 8.9–10.3)
Chloride: 101 mmol/L (ref 98–111)
Creatinine, Ser: 1.67 mg/dL — ABNORMAL HIGH (ref 0.61–1.24)
GFR, Estimated: 41 mL/min — ABNORMAL LOW (ref >60)
Glucose, Bld: 152 mg/dL — ABNORMAL HIGH (ref 70–99)
Potassium: 3.5 mmol/L (ref 3.5–5.1)
Sodium: 137 mmol/L (ref 135–145)

## 2021-08-08 LAB — CBC
HCT: 40.6 % (ref 39.0–52.0)
Hemoglobin: 13.6 g/dL (ref 13.0–17.0)
MCH: 30.3 pg (ref 26.0–34.0)
MCHC: 33.5 g/dL (ref 30.0–36.0)
MCV: 90.4 fL (ref 80.0–100.0)
Platelets: 273 10*3/uL (ref 150–400)
RBC: 4.49 MIL/uL (ref 4.22–5.81)
RDW: 14.7 % (ref 11.5–15.5)
WBC: 9 10*3/uL (ref 4.0–10.5)
nRBC: 0 % (ref 0.0–0.2)

## 2021-08-08 LAB — MAGNESIUM: Magnesium: 2.2 mg/dL (ref 1.7–2.4)

## 2021-08-08 LAB — BRAIN NATRIURETIC PEPTIDE: B Natriuretic Peptide: 1337.9 pg/mL — ABNORMAL HIGH (ref 0.0–100.0)

## 2021-08-08 MED ORDER — FUROSEMIDE 40 MG PO TABS
40.0000 mg | ORAL_TABLET | Freq: Every day | ORAL | Status: DC
  Administered 2021-08-09: 40 mg via ORAL
  Filled 2021-08-08: qty 1

## 2021-08-08 NOTE — Plan of Care (Signed)

## 2021-08-08 NOTE — Progress Notes (Signed)
Progress Note  Patient Name: Albert Powell Date of Encounter: 08/08/2021  [facility] Cardiologist: Albert Burow, MD   Subjective   Feeling well.  Breathing is much better.    Inpatient Medications    Scheduled Meds:  apixaban  5 mg Oral BID   atorvastatin  40 mg Oral QHS   clopidogrel  75 mg Oral Daily   dapagliflozin propanediol  10 mg Oral QAC breakfast   escitalopram  20 mg Oral Daily   furosemide  80 mg Intravenous BID   insulin aspart  0-9 Units Subcutaneous TID WC   isosorbide mononitrate  120 mg Oral Daily   metoprolol tartrate  25 mg Oral BID   pantoprazole  40 mg Oral Daily   potassium chloride  40 mEq Oral Daily   sacubitril-valsartan  1 tablet Oral BID   sodium chloride flush  3 mL Intravenous Q12H   spironolactone  25 mg Oral Daily   Continuous Infusions:  sodium chloride     PRN Meds: sodium chloride, acetaminophen, ondansetron (ZOFRAN) IV, sodium chloride flush   Vital Signs    Vitals:   08/07/21 2314 08/08/21 0247 08/08/21 0300 08/08/21 0744  BP: (!) 155/59 (!) 165/82    Pulse: (!) 57 60 63 67  Resp: 14 20 19 18   Temp: 98.6 F (37 C) 98.2 F (36.8 C)  98.3 F (36.8 C)  TempSrc: Oral Oral    SpO2: 93% 95% 95% 92%  Weight:   99.2 kg   Height:        Intake/Output Summary (Last 24 hours) at 08/08/2021 0943 Last data filed at 08/08/2021 0900 Gross per 24 hour  Intake 240 ml  Output 3250 ml  Net -3010 ml   Last 3 Weights 08/08/2021 08/07/2021 08/06/2021  Weight (lbs) 218 lb 11.1 oz 222 lb 7.1 oz 224 lb 13.9 oz  Weight (kg) 99.2 kg 100.9 kg 102 kg      Telemetry    Atrial fibrillation alternating with sinus with PACs, PVCs. Rate ~60 bpm - Personally Reviewed  ECG    N/a - Personally Reviewed  Physical Exam   VS:  BP (!) 165/82 (BP Location: Left Arm)   Pulse 67   Temp 98.3 F (36.8 C)   Resp 18   Ht 5' 9.5" (1.765 m)   Wt 99.2 kg   SpO2 92%   BMI 31.83 kg/m  , BMI Body mass index is 31.83 kg/m. GENERAL:  Well  appearing HEENT: Pupils equal round and reactive, fundi not visualized, oral mucosa unremarkable NECK:  No jugular venous distention, waveform within normal limits, carotid upstroke brisk and symmetric, no bruits, no thyromegaly LUNGS:  Clear to auscultation bilaterally HEART:  RRR.  PMI not displaced or sustained,S1 and S2 within normal limits, no S3, no S4, no clicks, no rubs, II/VI systolic murmur at the LUSB ABD:  Central adiposity.  Positive bowel sounds normal in frequency in pitch, no bruits, no rebound, no guarding, no midline pulsatile mass, no hepatomegaly, no splenomegaly EXT:  2 plus pulses throughout, trace edema, no cyanosis no clubbing SKIN:  No rashes no nodules NEURO:  Cranial nerves II through XII grossly intact, motor grossly intact throughout PSYCH:  Cognitively intact, oriented to person place and time   Labs    High Sensitivity Troponin:   Recent Labs  Lab 08/05/21 1726 08/05/21 2151  TROPONINIHS 101* 91*     Chemistry Recent Labs  Lab 08/06/21 0322 08/07/21 0302 08/08/21 0127  NA 137 138 137  K 3.7 3.9 3.5  CL 101 105 101  CO2 25 26 29   GLUCOSE 98 119* 152*  BUN 21 23 26*  CREATININE 1.55* 1.62* 1.67*  CALCIUM 8.7* 8.4* 8.4*  MG 2.4  --  2.2  GFRNONAA 45* 43* 41*  ANIONGAP 11 7 7     Lipids No results for input(s): CHOL, TRIG, HDL, LABVLDL, LDLCALC, CHOLHDL in the last 168 hours.  Hematology Recent Labs  Lab 08/05/21 1544 08/08/21 0127  WBC 10.3 9.0  RBC 4.52 4.49  HGB 13.4 13.6  HCT 41.1 40.6  MCV 90.9 90.4  MCH 29.6 30.3  MCHC 32.6 33.5  RDW 14.6 14.7  PLT 294 273   Thyroid  Recent Labs  Lab 08/07/21 0302  TSH 6.637*    BNP Recent Labs  Lab 08/05/21 1544  BNP >4,500.0*    DDimer No results for input(s): DDIMER in the last 168 hours.   Radiology    No results found.  Cardiac Studies   Echo 05/2021: 1. Left ventricular ejection fraction, by estimation, is 60 to 65%. The  left ventricle has normal function. The left  ventricle has no regional  wall motion abnormalities. There is mild concentric left ventricular  hypertrophy. Left ventricular diastolic  parameters are indeterminate. Elevated left ventricular end-diastolic  pressure.   2. Right ventricular systolic function is normal. The right ventricular  size is normal. Tricuspid regurgitation signal is inadequate for assessing  PA pressure.   3. A small pericardial effusion is present. The pericardial effusion is  posterior to the left ventricle.   4. The mitral valve is grossly normal. No evidence of mitral valve  regurgitation. No evidence of mitral stenosis.   5. The aortic valve is grossly normal. Aortic valve regurgitation is not  visualized. Mild aortic valve sclerosis is present, with no evidence of  aortic valve stenosis.   6. The inferior vena cava is normal in size with greater than 50%  respiratory variability, suggesting right atrial pressure of 3 mmHg.   Patient Profile     Albert Powell is a 87M with CAD status post RCA PCI and otherwise medically managed nonobstructive disease, hypertension, hyperlipidemia, diabetes, PAD, prior tobacco use (current vaping) and chronic diastolic heart failure admitted with acute on chronic diastolic heart failure  Assessment & Plan    # Acute on chronic diastolic heart failure:  # Hypertension:  BNP >4,500 on admission.  Volume status is difficult to assess.   Yesterday he was net -2.4 L.  He has -3.8 L for the admission.  His creatinine is slightly above his baseline but stable from yesterday after increasing his diuretic dose.  We will transition Lasix to 40 mg oral daily.  He was on 20 mg at home and not taking it regularly.  Check a BNP.  Blood pressure remains poorly controlled.  Spironolactone and Imdur were both increased.  Continue metoprolol.  We will increase his Entresto once we ensure that his renal function is stable.  He received a dose of IV Lasix this morning and switching to oral tomorrow  as above.  # CAD s/p PCI: # PAD:  # Hyperlipidemia:  Not an active issue. Continue clopidogrel, Imdur, atorvastatin and metoprolol.  Imdur was increased this admission.  # PAF: Rate controlled and alternating with sinus rhythm.  He is asymptomatic.  Continue metoprolol and Eliquis.       For questions or updates, please contact [place] Please consult [email or web address] for contact info under  Signed, Chilton Si, MD  08/08/2021, 9:43 AM

## 2021-08-08 NOTE — Progress Notes (Addendum)
PROGRESS NOTE  Albert Powell  OJJ:[id number] DOB: September 24, 1940 DOA: 08/05/2021 PCP: [name], MD    Brief Narrative:  Albert Powell is a 80 y.o. male with past medical history of paroxysmal atrial fibrillation on Eliquis, CAD, chronic diastolic heart failure, type 2 diabetes, CKD stage IIIa presented to hospital with increasing shortness of breath and leg swelling.  In the ED, patient was noted to have bilateral pleural effusion and pulmonary vascular congestion.  Lasix was given and patient was admitted hospital for further treatment.    Assessment & Plan: Principal Problem:   CHF (congestive heart failure) ([name]) Active Problems:   CAD in native artery   PAD (peripheral artery disease) ([name])   Acute on chronic diastolic heart failure ([name])   Hypertensive urgency  Acute on chronic HFpEF, HTN:  Thought to be secondary to insufficient adherence to Lasix.  Patient with recurrent CHF admissions.  Currently on Lasix 80 mg IV twice a day.  Seen by cardiology.  Continue intake and output charting, daily weights.  Continue spironolactone, metoprolol, Entresto and imdur.     CAD hx stents with demand myocardial ischemia:  Cardiology on board.   On Eliquis.  Denies chest pain.  Stage IIIa CKD:  Continue to monitor BMP on diuretics.  Diabetes mellitus type 2.:  Latest HbA1c 7.1%.  Continue sliding scale insulin.  Latest POC glucose of 113.  Continue to hold semaglutide and glipizide, continue dapagliflozin.   Paroxysmal atrial fibrillation  Continue Eliquis, metoprolol.   PAD:  Continue statins Plavix and anticoagulation.     Depression:  Continue Lexapro  Obesity:  Patient would benefit from weight loss as outpatient  DVT prophylaxis: Eliquis  Code Status: DNR  Family Communication:  Not at bedside.  Disposition Plan:  Plan for DC home when okay with cardiology.  Physical therapy recommends home health PT on discharge  Status is: Inpatient  Remains  inpatient appropriate because: IV diuresis  Consultants:  Cardiology  Procedures:  2 D Echo  Antimicrobials: None   Subjective: Today, patient was seen and examined at bedside.  Continues to feel better with breathing.  States that he has ambulated to the bathroom.  Denies any chest pain, dizziness.  Objective:  Vitals:   08/07/21 2314 08/08/21 0247 08/08/21 0300 08/08/21 0744  BP: (!) 155/59 (!) 165/82    Pulse: (!) 57 60 63 67  Resp: 14 20 19 18   Temp: 98.6 F (37 C) 98.2 F (36.8 C)  98.3 F (36.8 C)  TempSrc: Oral Oral    SpO2: 93% 95% 95% 92%  Weight:   99.2 kg   Height:        Intake/Output Summary (Last 24 hours) at 08/08/2021 1008 Last data filed at 08/08/2021 0900 Gross per 24 hour  Intake 240 ml  Output 3250 ml  Net -3010 ml    Filed Weights   08/06/21 1024 08/07/21 0500 08/08/21 0300  Weight: 102 kg 100.9 kg 99.2 kg   Body mass index is 31.83 kg/m.   General: Obese built, not in obvious distress HENT:   No scleral pallor or icterus noted. Oral mucosa is moist.  Chest:   Diminished breath sounds bilaterally.  CVS: S1 &S2 heard.  Systolic murmur noted, irregular rhythm Abdomen: Soft, nontender, nondistended.  Bowel sounds are heard.   Extremities: No cyanosis, clubbing with bilateral lower extremity edema peripheral pulses are palpable. Psych: Alert, awake and oriented, normal mood CNS:  No cranial nerve deficits.  Power equal in all extremities.  Skin: Warm and dry.  No rashes noted.  Data Reviewed: I have personally reviewed following labs and imaging studies  CBC: Recent Labs  Lab 08/05/21 1544 08/08/21 0127  WBC 10.3 9.0  NEUTROABS 8.6*  --   HGB 13.4 13.6  HCT 41.1 40.6  MCV 90.9 90.4  PLT 294 [id number]    Basic Metabolic Panel: Recent Labs  Lab 08/05/21 1544 08/06/21 0322 08/07/21 0302 08/08/21 0127  NA 137 137 138 137  K 3.2* 3.7 3.9 3.5  CL 99 101 105 101  CO2 26 25 26 29   GLUCOSE 175* 98 119* 152*  BUN 15 21 23  26*   CREATININE 1.43* 1.55* 1.62* 1.67*  CALCIUM 8.6* 8.7* 8.4* 8.4*  MG  --  2.4  --  2.2    GFR: Estimated Creatinine Clearance: 42 mL/min (A) (by C-G formula based on SCr of 1.67 mg/dL (H)). Liver Function Tests: No results for input(s): AST, ALT, ALKPHOS, BILITOT, PROT, ALBUMIN in the last 168 hours. No results for input(s): LIPASE, AMYLASE in the last 168 hours. No results for input(s): AMMONIA in the last 168 hours. Coagulation Profile: No results for input(s): INR, PROTIME in the last 168 hours. Cardiac Enzymes: No results for input(s): CKTOTAL, CKMB, CKMBINDEX, TROPONINI in the last 168 hours. BNP (last 3 results) No results for input(s): PROBNP in the last 8760 hours. HbA1C: No results for input(s): HGBA1C in the last 72 hours. CBG: Recent Labs  Lab 08/07/21 0550 08/07/21 1132 08/07/21 1610 08/07/21 2119 08/08/21 0616  GLUCAP 101* 113* 151* 140* 113*    Lipid Profile: No results for input(s): CHOL, HDL, LDLCALC, TRIG, CHOLHDL, LDLDIRECT in the last 72 hours. Thyroid Function Tests: Recent Labs    08/07/21 0302  TSH 6.637*    Anemia Panel: No results for input(s): VITAMINB12, FOLATE, FERRITIN, TIBC, IRON, RETICCTPCT in the last 72 hours. Urine analysis:    Component Value Date/Time   COLORURINE YELLOW 01/01/2021 1351   APPEARANCEUR CLEAR 01/01/2021 1351   LABSPEC 1.009 01/01/2021 1351   PHURINE 6.0 01/01/2021 1351   GLUCOSEU >=500 (A) 01/01/2021 1351   HGBUR SMALL (A) 01/01/2021 1351   BILIRUBINUR NEGATIVE 01/01/2021 1351   KETONESUR NEGATIVE 01/01/2021 1351   PROTEINUR >=300 (A) 01/01/2021 1351   UROBILINOGEN 1.0 05/31/2013 1432   NITRITE NEGATIVE 01/01/2021 1351   LEUKOCYTESUR NEGATIVE 01/01/2021 1351   Recent Results (from the past 240 hour(s))  Resp Panel by RT-PCR (Flu A&B, Covid) Nasopharyngeal Swab     Status: None   Collection Time: 08/05/21  5:31 PM   Specimen: Nasopharyngeal Swab; Nasopharyngeal(NP) swabs in vial transport medium  Result  Value Ref Range Status   SARS Coronavirus 2 by RT PCR NEGATIVE NEGATIVE Final    Comment: (NOTE) SARS-CoV-2 target nucleic acids are NOT DETECTED.  The SARS-CoV-2 RNA is generally detectable in upper respiratory specimens during the acute phase of infection. The lowest concentration of SARS-CoV-2 viral copies this assay can detect is 138 copies/mL. A negative result does not preclude SARS-Cov-2 infection and should not be used as the sole basis for treatment or other patient management decisions. A negative result may occur with  improper specimen collection/handling, submission of specimen other than nasopharyngeal swab, presence of viral mutation(s) within the areas targeted by this assay, and inadequate number of viral copies(<138 copies/mL). A negative result must be combined with clinical observations, patient history, and epidemiological information. The expected result is Negative.  Fact Sheet for Patients:  EntrepreneurPulse.com.au  Fact Sheet for Healthcare Providers:  IncredibleEmployment.be  This test is no t yet approved or cleared by the [place] and  has been authorized for detection and/or diagnosis of SARS-CoV-2 by FDA under an Emergency Use Authorization (EUA). This EUA will remain  in effect (meaning this test can be used) for the duration of the COVID-19 declaration under Section 564(b)(1) of the Act, 21 U.S.C.section 360bbb-3(b)(1), unless the authorization is terminated  or revoked sooner.       Influenza A by PCR NEGATIVE NEGATIVE Final   Influenza B by PCR NEGATIVE NEGATIVE Final    Comment: (NOTE) The Xpert Xpress SARS-CoV-2/FLU/RSV plus assay is intended as an aid in the diagnosis of influenza from Nasopharyngeal swab specimens and should not be used as a sole basis for treatment. Nasal washings and aspirates are unacceptable for Xpert Xpress SARS-CoV-2/FLU/RSV testing.  Fact Sheet for  Patients: EntrepreneurPulse.com.au  Fact Sheet for Healthcare Providers: IncredibleEmployment.be  This test is not yet approved or cleared by the [place] FDA and has been authorized for detection and/or diagnosis of SARS-CoV-2 by FDA under an Emergency Use Authorization (EUA). This EUA will remain in effect (meaning this test can be used) for the duration of the COVID-19 declaration under Section 564(b)(1) of the Act, 21 U.S.C. section 360bbb-3(b)(1), unless the authorization is terminated or revoked.  Performed at [facility], [place] [address] [place], [place] [address]   MRSA Next Gen by PCR, Nasal     Status: None   Collection Time: 08/06/21  9:38 PM   Specimen: Nasal Mucosa; Nasal Swab  Result Value Ref Range Status   MRSA by PCR Next Gen NOT DETECTED NOT DETECTED Final    Comment: (NOTE) The GeneXpert MRSA Assay (FDA approved for NASAL specimens only), is one component of a comprehensive MRSA colonization surveillance program. It is not intended to diagnose MRSA infection nor to guide or monitor treatment for MRSA infections. Test performance is not FDA approved in patients less than 66 years old. Performed at [facility], [place] [address], [place], [place] [address]       Radiology Studies: No results found.  Scheduled Meds:  apixaban  5 mg Oral BID   atorvastatin  40 mg Oral QHS   clopidogrel  75 mg Oral Daily   dapagliflozin propanediol  10 mg Oral QAC breakfast   escitalopram  20 mg Oral Daily   [START ON 08/09/2021] furosemide  40 mg Oral Daily   insulin aspart  0-9 Units Subcutaneous TID WC   isosorbide mononitrate  120 mg Oral Daily   metoprolol tartrate  25 mg Oral BID   pantoprazole  40 mg Oral Daily   potassium chloride  40 mEq Oral Daily   sacubitril-valsartan  1 tablet Oral BID   sodium chloride flush  3 mL Intravenous Q12H   spironolactone  25 mg Oral Daily   Continuous Infusions:  sodium  chloride       LOS: 3 days    Flora Lipps, MD [facility] 08/08/2021, 10:08 AM

## 2021-08-09 ENCOUNTER — Other Ambulatory Visit (HOSPITAL_COMMUNITY): Payer: Self-pay

## 2021-08-09 DIAGNOSIS — I160 Hypertensive urgency: Secondary | ICD-10-CM | POA: Diagnosis not present

## 2021-08-09 DIAGNOSIS — I5033 Acute on chronic diastolic (congestive) heart failure: Secondary | ICD-10-CM | POA: Diagnosis not present

## 2021-08-09 DIAGNOSIS — I2510 Atherosclerotic heart disease of native coronary artery without angina pectoris: Secondary | ICD-10-CM | POA: Diagnosis not present

## 2021-08-09 DIAGNOSIS — I739 Peripheral vascular disease, unspecified: Secondary | ICD-10-CM | POA: Diagnosis not present

## 2021-08-09 LAB — BASIC METABOLIC PANEL
Anion gap: 10 (ref 5–15)
BUN: 26 mg/dL — ABNORMAL HIGH (ref 8–23)
CO2: 26 mmol/L (ref 22–32)
Calcium: 8.6 mg/dL — ABNORMAL LOW (ref 8.9–10.3)
Chloride: 102 mmol/L (ref 98–111)
Creatinine, Ser: 1.65 mg/dL — ABNORMAL HIGH (ref 0.61–1.24)
GFR, Estimated: 42 mL/min — ABNORMAL LOW (ref >60)
Glucose, Bld: 164 mg/dL — ABNORMAL HIGH (ref 70–99)
Potassium: 3.6 mmol/L (ref 3.5–5.1)
Sodium: 138 mmol/L (ref 135–145)

## 2021-08-09 LAB — CBC
HCT: 44.6 % (ref 39.0–52.0)
Hemoglobin: 14.6 g/dL (ref 13.0–17.0)
MCH: 29.8 pg (ref 26.0–34.0)
MCHC: 32.7 g/dL (ref 30.0–36.0)
MCV: 91 fL (ref 80.0–100.0)
Platelets: 265 10*3/uL (ref 150–400)
RBC: 4.9 MIL/uL (ref 4.22–5.81)
RDW: 14.6 % (ref 11.5–15.5)
WBC: 8.9 10*3/uL (ref 4.0–10.5)
nRBC: 0 % (ref 0.0–0.2)

## 2021-08-09 LAB — PROTIME-INR
INR: 1.3 — ABNORMAL HIGH (ref 0.8–1.2)
Prothrombin Time: 16.3 seconds — ABNORMAL HIGH (ref 11.4–15.2)

## 2021-08-09 LAB — MAGNESIUM: Magnesium: 2.1 mg/dL (ref 1.7–2.4)

## 2021-08-09 LAB — GLUCOSE, CAPILLARY: Glucose-Capillary: 140 mg/dL — ABNORMAL HIGH (ref 70–99)

## 2021-08-09 MED ORDER — SPIRONOLACTONE 25 MG PO TABS
12.5000 mg | ORAL_TABLET | Freq: Every day | ORAL | 2 refills | Status: DC
  Filled 2021-08-09: qty 45, 90d supply, fill #0

## 2021-08-09 MED ORDER — ISOSORBIDE MONONITRATE ER 60 MG PO TB24
120.0000 mg | ORAL_TABLET | Freq: Every day | ORAL | 2 refills | Status: DC
  Filled 2021-08-09: qty 60, 30d supply, fill #0

## 2021-08-09 MED ORDER — FUROSEMIDE 20 MG PO TABS: 20.0000 mg | ORAL_TABLET | Freq: Every day | ORAL | Status: DC

## 2021-08-09 MED ORDER — SACUBITRIL-VALSARTAN 97-103 MG PO TABS
1.0000 | ORAL_TABLET | Freq: Two times a day (BID) | ORAL | Status: DC
Start: 2021-08-09 — End: 2021-08-09
  Filled 2021-08-09: qty 1

## 2021-08-09 MED ORDER — SACUBITRIL-VALSARTAN 97-103 MG PO TABS
1.0000 | ORAL_TABLET | Freq: Two times a day (BID) | ORAL | 2 refills | Status: DC
  Filled 2021-08-09: qty 60, 30d supply, fill #0

## 2021-08-09 NOTE — Progress Notes (Signed)
Discharge instructions given to patient. Patient understands medication changes and follow up appointments. Patient is discharging home with his wife.

## 2021-08-09 NOTE — TOC Initial Note (Signed)
Transition of Care [facility]) - Initial/Assessment Note    Patient Details  Name: Albert Powell MRN: [id number] Date of Birth: 1941/03/30  [facility] CM/SW Contact:    Lockie Pares, RN Phone Number: 08/09/2021, 8:32 AM  Clinical Narrative:                Patient  from Home, just ended [facility] with [facility].Spoke to patient regarding continuing services, patient is good with continuing [facility]. Needs no DME and has transportation to home.  No further needs identified.   Expected Discharge Plan: Home w Home Health Services Barriers to Discharge: Continued Medical Work up   Patient Goals and CMS Choice        Expected Discharge Plan and Services Expected Discharge Plan: Home w Home Health Services     Post Acute Care Choice: Home Health Living arrangements for the past 2 months: Apartment                           HH Arranged: PT HH Agency: [facility] Date HH Agency Contacted: 08/09/21 Time HH Agency Contacted: [phone] Representative spoke with at [facility] Agency: Pearson Grippe  Prior Living Arrangements/Services Living arrangements for the past 2 months: Apartment Lives with:: Spouse Patient language and need for interpreter reviewed:: Yes Do you feel safe going back to the place where you live?: Yes      Need for Family Participation in Patient Care: Yes (Comment) Care giver support system in place?: Yes (comment)   Criminal Activity/Legal Involvement Pertinent to Current Situation/Hospitalization: No - Comment as needed  Activities of Daily Living   ADL Screening (condition at time of admission) Patient's cognitive ability adequate to safely complete daily activities?: Yes Is the patient deaf or have difficulty hearing?: No Does the patient have difficulty seeing, even when wearing glasses/contacts?: No Does the patient have difficulty concentrating, remembering, or making decisions?: No Patient able to express need for assistance with ADLs?:  Yes Does the patient have difficulty dressing or bathing?: No Independently performs ADLs?: Yes (appropriate for developmental age) Does the patient have difficulty walking or climbing stairs?: No Weakness of Legs: None Weakness of Arms/Hands: None  Permission Sought/Granted                  Emotional Assessment   Attitude/Demeanor/Rapport: Gracious   Orientation: : Oriented to Self, Oriented to Place, Oriented to  Time, Oriented to Situation Alcohol / Substance Use: Not Applicable Psych Involvement: No (comment)  Admission diagnosis:  CHF (congestive heart failure) ([name]) [I50.9] Acute on chronic congestive heart failure, unspecified heart failure type ([name]) [I50.9] Patient Active Problem List   Diagnosis Date Noted   Hypertensive urgency    Acute on chronic diastolic heart failure ([name]) 06/07/2021   Acute metabolic encephalopathy    Acute exacerbation of CHF (congestive heart failure) ([name]) 06/06/2021   CHF (congestive heart failure) ([name]) 03/10/2021   Acute hypoxemic respiratory failure ([name]) 03/10/2021   Uncontrolled hypertension 03/10/2021   Hypertensive emergency 01/01/2021   ACS (acute coronary syndrome) ([name])    AKI (acute kidney injury) ([name])    Atrial fibrillation with RVR ([name]) 03/30/2020   Contusion of right hand 11/12/2018   Primary osteoarthritis of both first carpometacarpal joints 11/12/2018   Pain in joint of right shoulder 10/05/2018   Pain in right hand 10/05/2018   Hematoma 03/30/2018   Degeneration of lumbar intervertebral disc 12/21/2017   Lumbar radiculopathy 12/21/2017   Vertigo 04/09/2017  Hypogonadism in male 12/07/2014   Screening for prostate cancer 12/07/2014   Claudication of right lower extremity ([place]) 10/20/2013   Claudication in peripheral vascular disease- Rt leg 09/20/2013   Obesity (BMI 30-39.9)- negative sleep study in the past 09/20/2013   Occlusion and stenosis of carotid artery without mention of cerebral infarction 05/23/2013    PAD (peripheral artery disease) ([place]) 05/18/2013   Carotid artery disease ([place]) 04/14/2013   Bradycardia, sinus 02/18/2013   Hyperlipidemia 02/05/2013   Tobacco abuse 02/05/2013   Chest pain, unstable angina, negative MI 02/04/2013   Type 2 diabetes mellitus ([place]) 02/04/2013   CAD in native artery 02/04/2013   Benign essential hypertension 07/10/2011   Incisional hernia 05/22/2011   PCP:  Leeroy Cha, MD Pharmacy:   [facility] [place], [place] to Registered [address] [place] [address] Phone: [phone] Fax: [place] [address] [place] [address] Phone: [phone] Fax: [phone]     Social Determinants of Health (SDOH) Interventions Food Insecurity Interventions: Intervention Not Indicated Financial Strain Interventions: Intervention Not Indicated Housing Interventions: Intervention Not Indicated Transportation Interventions: Intervention Not Indicated  Readmission Risk Interventions No flowsheet data found.

## 2021-08-09 NOTE — Progress Notes (Signed)
Progress Note  Patient Name: Albert Powell Date of Encounter: 08/09/2021  [facility] Cardiologist: Quay Burow, MD   Subjective   Feeling well.  Breathing is much better.  Ambulated without difficulty  Inpatient Medications    Scheduled Meds:  apixaban  5 mg Oral BID   atorvastatin  40 mg Oral QHS   clopidogrel  75 mg Oral Daily   dapagliflozin propanediol  10 mg Oral QAC breakfast   escitalopram  20 mg Oral Daily   furosemide  40 mg Oral Daily   insulin aspart  0-9 Units Subcutaneous TID WC   isosorbide mononitrate  120 mg Oral Daily   metoprolol tartrate  25 mg Oral BID   pantoprazole  40 mg Oral Daily   potassium chloride  40 mEq Oral Daily   sacubitril-valsartan  1 tablet Oral BID   sodium chloride flush  3 mL Intravenous Q12H   spironolactone  25 mg Oral Daily   Continuous Infusions:  sodium chloride     PRN Meds: sodium chloride, acetaminophen, ondansetron (ZOFRAN) IV, sodium chloride flush   Vital Signs    Vitals:   08/08/21 2300 08/09/21 0322 08/09/21 0730 08/09/21 0824  BP: (!) 156/67 (!) 173/82  (!) 150/62  Pulse: 67 60  65  Resp: 15 16  20   Temp: 97.9 F (36.6 C) 98.1 F (36.7 C)  98 F (36.7 C)  TempSrc: Oral Oral  Oral  SpO2: 99% 99% 95% 90%  Weight:  99.1 kg    Height:        Intake/Output Summary (Last 24 hours) at 08/09/2021 0839 Last data filed at 08/09/2021 0826 Gross per 24 hour  Intake 600 ml  Output 2450 ml  Net -1850 ml   Last 3 Weights 08/09/2021 08/08/2021 08/07/2021  Weight (lbs) 218 lb 7.6 oz 218 lb 11.1 oz 222 lb 7.1 oz  Weight (kg) 99.1 kg 99.2 kg 100.9 kg      Telemetry    Atrial fibrillation alternating with sinus with PACs, PVCs. Rate ~60 bpm - Personally Reviewed  ECG    N/a - Personally Reviewed  Physical Exam   VS:  BP (!) 150/62   Pulse 65   Temp 98 F (36.7 C) (Oral)   Resp 20   Ht 5' 9.5" (1.765 m)   Wt 99.1 kg   SpO2 90%   BMI 31.80 kg/m  , BMI Body mass index is 31.8  kg/m. GENERAL:  Well appearing HEENT: Pupils equal round and reactive, fundi not visualized, oral mucosa unremarkable NECK:  No jugular venous distention, waveform within normal limits, carotid upstroke brisk and symmetric, no bruits, no thyromegaly LUNGS:  Clear to auscultation bilaterally HEART:  RRR.  PMI not displaced or sustained,S1 and S2 within normal limits, no S3, no S4, no clicks, no rubs, II/VI systolic murmur at the LUSB ABD:  Central adiposity.  Positive bowel sounds normal in frequency in pitch, no bruits, no rebound, no guarding, no midline pulsatile mass, no hepatomegaly, no splenomegaly EXT:  2 plus pulses throughout, trace edema, no cyanosis no clubbing SKIN:  No rashes no nodules NEURO:  Cranial nerves II through XII grossly intact, motor grossly intact throughout PSYCH:  Cognitively intact, oriented to person place and time  Labs    High Sensitivity Troponin:   Recent Labs  Lab 08/05/21 1726 08/05/21 2151  TROPONINIHS 101* 91*     Chemistry Recent Labs  Lab 08/06/21 0322 08/07/21 0302 08/08/21 0127 08/09/21 0122  NA 137 138 137  138  K 3.7 3.9 3.5 3.6  CL 101 105 101 102  CO2 25 26 29 26   GLUCOSE 98 119* 152* 164*  BUN 21 23 26* 26*  CREATININE 1.55* 1.62* 1.67* 1.65*  CALCIUM 8.7* 8.4* 8.4* 8.6*  MG 2.4  --  2.2 2.1  GFRNONAA 45* 43* 41* 42*  ANIONGAP 11 7 7 10     Lipids No results for input(s): CHOL, TRIG, HDL, LABVLDL, LDLCALC, CHOLHDL in the last 168 hours.  Hematology Recent Labs  Lab 08/05/21 1544 08/08/21 0127 08/09/21 0122  WBC 10.3 9.0 8.9  RBC 4.52 4.49 4.90  HGB 13.4 13.6 14.6  HCT 41.1 40.6 44.6  MCV 90.9 90.4 91.0  MCH 29.6 30.3 29.8  MCHC 32.6 33.5 32.7  RDW 14.6 14.7 14.6  PLT 294 273 265   Thyroid  Recent Labs  Lab 08/07/21 0302  TSH 6.637*    BNP Recent Labs  Lab 08/05/21 1544 08/08/21 0127  BNP >4,500.0* 1,337.9*    DDimer No results for input(s): DDIMER in the last 168 hours.   Radiology    No results  found.  Cardiac Studies   Echo 05/2021: 1. Left ventricular ejection fraction, by estimation, is 60 to 65%. The  left ventricle has normal function. The left ventricle has no regional  wall motion abnormalities. There is mild concentric left ventricular  hypertrophy. Left ventricular diastolic  parameters are indeterminate. Elevated left ventricular end-diastolic  pressure.   2. Right ventricular systolic function is normal. The right ventricular  size is normal. Tricuspid regurgitation signal is inadequate for assessing  PA pressure.   3. A small pericardial effusion is present. The pericardial effusion is  posterior to the left ventricle.   4. The mitral valve is grossly normal. No evidence of mitral valve  regurgitation. No evidence of mitral stenosis.   5. The aortic valve is grossly normal. Aortic valve regurgitation is not  visualized. Mild aortic valve sclerosis is present, with no evidence of  aortic valve stenosis.   6. The inferior vena cava is normal in size with greater than 50%  respiratory variability, suggesting right atrial pressure of 3 mmHg.   Patient Profile     [name] is a 76M with CAD status post RCA PCI and otherwise medically managed nonobstructive disease, hypertension, hyperlipidemia, diabetes, PAD, prior tobacco use (current vaping) and chronic diastolic heart failure admitted with acute on chronic diastolic heart failure  Assessment & Plan    # Acute on chronic diastolic heart failure:  # Hypertension:  BNP >4,500 on admission.  Down to 1337 yesterday.  He feels that his breathing is at baseline and feeling much better.  He appears to be euvolemic on exam. BP remains poorly controlled. We will increase Entresto to 97/103mg  bid.  Keep lasix at 20mg  daily and he will start taking it daily.  Continue metoprolol.  Spironolactone was increased this admission.  # CAD s/p PCI: # PAD:  # Hyperlipidemia:  Not an active issue. Continue clopidogrel, Imdur,  atorvastatin and metoprolol.  Imdur was increased this admission.  # PAF: Rate controlled and alternating with sinus rhythm.  He is asymptomatic.  Continue metoprolol and Eliquis.   [facility] will sign off.   Medication Recommendations:  Increase Entresto to 97/103. Other recommendations (labs, testing, etc):  BMP 1 week.  Follow up as an outpatient:  Arranged 12/1      For questions or updates, please contact [facility] Please consult [email or web address] for contact info under  Signed, Skeet Latch, MD  08/09/2021, 8:39 AM

## 2021-08-09 NOTE — Discharge Summary (Signed)
Physician Discharge Summary  Albert Powell [id number] DOB: [birth year] DOA: 08/05/2021  PCP: Albert Cha, MD  Admit date: 08/05/2021 Discharge date: 08/09/2021  Admitted From: Home  Discharge disposition: Home  Recommendations for Outpatient Follow-Up:   Follow up with your primary care provider in one week.  Check CBC, BMP, magnesium in the next visit Follow-up with cardiology as has been scheduled on 08/15/2021.  Discharge Diagnosis:   Principal Problem:   CHF (congestive heart failure) ([name]) Active Problems:   CAD in native artery   PAD (peripheral artery disease) ([name])   Acute on chronic diastolic heart failure ([place])   Hypertensive urgency   Discharge Condition: Improved.  Diet recommendation: Low sodium, heart healthy.  Carbohydrate-modified.  1500 mils fluid restriction per day  Wound care: None.  Code status: Full.   History of Present Illness:   Albert Powell is a 80 y.o. male with past medical history of paroxysmal atrial fibrillation on Eliquis, CAD, chronic diastolic heart failure, type 2 diabetes, CKD stage IIIa presented to the hospital with increasing shortness of breath and leg swelling.  In the ED, patient was noted to have bilateral pleural effusion and pulmonary vascular congestion.  Lasix was given and patient was admitted hospital for further treatment.     Hospital Course:   Following conditions were addressed during hospitalization as listed below,  Acute on chronic HFpEF, HTN:  Thought to be secondary to insufficient adherence to Lasix.  Patient with recurrent CHF admissions.  During hospitalization patient was kept on Lasix 80 mg IV twice a day.  Seen by cardiology.  At this time patient has been resumed on home dose of Lasix but dose of Entresto and Imdur has been increased.  Continue spironolactone, metoprolol on discharge.    CAD hx stents with demand myocardial ischemia:  Patient did not have any chest pain during  hospitalization.  Remained stable for will need to monitor renal function as outpatient   Stage IIIa CKD:  Continue to monitor BMP on diuretics.   Diabetes mellitus type 2.:  Latest HbA1c 7.1%.  On semaglutide and glipizide,  dapagliflozin.  Diabetic diet adviced on discharge.   Paroxysmal atrial fibrillation  Continue Eliquis, metoprolol.  Rate controlled.   PAD:  Continue statins, Plavix and Eliquis.   Depression:  Continue Lexapro   Obesity:  Patient would benefit from weight loss as outpatient  Disposition.  At this time, patient is stable for disposition home with outpatient PCP and cardiology follow-up.  Spoke with the patient's wife regarding plan for disposition.  Medical Consultants:   Cardiology  Procedures:    None Subjective:   Today, patient was seen and examined at bedside.  Feels okay.  Denies any chest pain, shortness of breath, dizziness, redness was able to lie flat in bed.  Wishes to go home.  Discharge Exam:   Vitals:   08/09/21 0730 08/09/21 0824  BP:  (!) 150/62  Pulse:  65  Resp:  20  Temp:  98 F (36.7 C)  SpO2: 95% 90%   Vitals:   08/08/21 2300 08/09/21 0322 08/09/21 0730 08/09/21 0824  BP: (!) 156/67 (!) 173/82  (!) 150/62  Pulse: 67 60  65  Resp: 15 16  20   Temp: 97.9 F (36.6 C) 98.1 F (36.7 C)  98 F (36.7 C)  TempSrc: Oral Oral  Oral  SpO2: 99% 99% 95% 90%  Weight:  99.1 kg    Height:        General: Alert awake,  not in obvious distress, obese HENT: pupils equally reacting to light,  No scleral pallor or icterus noted. Oral mucosa is moist.  Chest:  .  Diminished breath sounds bilaterally. No crackles or wheezes.  CVS: S1 &S2 heard.  Systolic murmur noted, regular rate and rhythm. Abdomen: Soft, nontender, nondistended.  Bowel sounds are heard.   Extremities: No cyanosis, clubbing trace bilateral lower extremity edema.  Peripheral pulses are palpable. Psych: Alert, awake and oriented, normal mood CNS:  No cranial nerve  deficits.  Power equal in all extremities.   Skin: Warm and dry.  No rashes noted.  The results of significant diagnostics from this hospitalization (including imaging, microbiology, ancillary and laboratory) are listed below for reference.     Diagnostic Studies:   DG Chest 2 View  Result Date: 08/06/2021 CLINICAL DATA:  80 year old male with history of shortness of breath. EXAM: CHEST - 2 VIEW COMPARISON:  Chest x-ray 08/05/2021. FINDINGS: Lung volumes are normal. Bibasilar opacities which may reflect areas of atelectasis and/or consolidation. Small bilateral pleural effusions. Cephalization of the pulmonary vasculature, without frank pulmonary edema. Heart size is borderline enlarged. Upper mediastinal contours are within normal limits. Atherosclerotic calcifications are noted in the thoracic aorta. Spinal cord stimulator projecting over the midthoracic region. IMPRESSION: 1. Small bilateral pleural effusions with areas of bibasilar atelectasis and/or consolidation. 2. Cephalization of the pulmonary vasculature, without frank pulmonary edema. 3. Aortic atherosclerosis. Electronically Signed   By: Vinnie Langton M.D.   On: 08/06/2021 07:03   DG Chest 2 View  Result Date: 08/05/2021 CLINICAL DATA:  Shortness of breath EXAM: CHEST - 2 VIEW COMPARISON:  Radiograph 06/06/2021 FINDINGS: Unchanged size of the cardiomediastinal silhouette. There is bibasilar airspace disease and small to moderate size bilateral pleural effusions. No visible pneumothorax. Mild central pulmonary vascular congestion. No acute osseous abnormality. Spinal cord stimulator lead overlies the mid chest. IMPRESSION: Small to moderate-sized bilateral pleural effusions with bibasilar airspace disease, favored to be atelectasis. Mild central pulmonary vascular congestion/mild edema. Electronically Signed   By: Maurine Simmering M.D.   On: 08/05/2021 16:33     Labs:   Basic Metabolic Panel: Recent Labs  Lab 08/05/21 1544  08/06/21 0322 08/07/21 0302 08/08/21 0127 08/09/21 0122  NA 137 137 138 137 138  K 3.2* 3.7 3.9 3.5 3.6  CL 99 101 105 101 102  CO2 26 25 26 29 26   GLUCOSE 175* 98 119* 152* 164*  BUN 15 21 23  26* 26*  CREATININE 1.43* 1.55* 1.62* 1.67* 1.65*  CALCIUM 8.6* 8.7* 8.4* 8.4* 8.6*  MG  --  2.4  --  2.2 2.1   GFR Estimated Creatinine Clearance: 42.5 mL/min (A) (by C-G formula based on SCr of 1.65 mg/dL (H)). Liver Function Tests: No results for input(s): AST, ALT, ALKPHOS, BILITOT, PROT, ALBUMIN in the last 168 hours. No results for input(s): LIPASE, AMYLASE in the last 168 hours. No results for input(s): AMMONIA in the last 168 hours. Coagulation profile Recent Labs  Lab 08/09/21 0122  INR 1.3*    CBC: Recent Labs  Lab 08/05/21 1544 08/08/21 0127 08/09/21 0122  WBC 10.3 9.0 8.9  NEUTROABS 8.6*  --   --   HGB 13.4 13.6 14.6  HCT 41.1 40.6 44.6  MCV 90.9 90.4 91.0  PLT 294 273 265   Cardiac Enzymes: No results for input(s): CKTOTAL, CKMB, CKMBINDEX, TROPONINI in the last 168 hours. BNP: Invalid input(s): POCBNP CBG: Recent Labs  Lab 08/08/21 0616 08/08/21 1142 08/08/21 1619 08/08/21 2131 08/09/21  0625  GLUCAP 113* 141* 128* 114* 140*   D-Dimer No results for input(s): DDIMER in the last 72 hours. Hgb A1c No results for input(s): HGBA1C in the last 72 hours. Lipid Profile No results for input(s): CHOL, HDL, LDLCALC, TRIG, CHOLHDL, LDLDIRECT in the last 72 hours. Thyroid function studies Recent Labs    08/07/21 0302  TSH 6.637*   Anemia work up No results for input(s): VITAMINB12, FOLATE, FERRITIN, TIBC, IRON, RETICCTPCT in the last 72 hours. Microbiology Recent Results (from the past 240 hour(s))  Resp Panel by RT-PCR (Flu A&B, Covid) Nasopharyngeal Swab     Status: None   Collection Time: 08/05/21  5:31 PM   Specimen: Nasopharyngeal Swab; Nasopharyngeal(NP) swabs in vial transport medium  Result Value Ref Range Status   SARS Coronavirus 2 by RT PCR  NEGATIVE NEGATIVE Final    Comment: (NOTE) SARS-CoV-2 target nucleic acids are NOT DETECTED.  The SARS-CoV-2 RNA is generally detectable in upper respiratory specimens during the acute phase of infection. The lowest concentration of SARS-CoV-2 viral copies this assay can detect is 138 copies/mL. A negative result does not preclude SARS-Cov-2 infection and should not be used as the sole basis for treatment or other patient management decisions. A negative result may occur with  improper specimen collection/handling, submission of specimen other than nasopharyngeal swab, presence of viral mutation(s) within the areas targeted by this assay, and inadequate number of viral copies(<138 copies/mL). A negative result must be combined with clinical observations, patient history, and epidemiological information. The expected result is Negative.  Fact Sheet for Patients:  EntrepreneurPulse.com.au  Fact Sheet for Healthcare Providers:  IncredibleEmployment.be  This test is no t yet approved or cleared by the [place] FDA and  has been authorized for detection and/or diagnosis of SARS-CoV-2 by FDA under an Emergency Use Authorization (EUA). This EUA will remain  in effect (meaning this test can be used) for the duration of the COVID-19 declaration under Section 564(b)(1) of the Act, 21 U.S.C.section 360bbb-3(b)(1), unless the authorization is terminated  or revoked sooner.       Influenza A by PCR NEGATIVE NEGATIVE Final   Influenza B by PCR NEGATIVE NEGATIVE Final    Comment: (NOTE) The Xpert Xpress SARS-CoV-2/FLU/RSV plus assay is intended as an aid in the diagnosis of influenza from Nasopharyngeal swab specimens and should not be used as a sole basis for treatment. Nasal washings and aspirates are unacceptable for Xpert Xpress SARS-CoV-2/FLU/RSV testing.  Fact Sheet for Patients: EntrepreneurPulse.com.au  Fact Sheet for  Healthcare Providers: IncredibleEmployment.be  This test is not yet approved or cleared by the [place] FDA and has been authorized for detection and/or diagnosis of SARS-CoV-2 by FDA under an Emergency Use Authorization (EUA). This EUA will remain in effect (meaning this test can be used) for the duration of the COVID-19 declaration under Section 564(b)(1) of the Act, 21 U.S.C. section 360bbb-3(b)(1), unless the authorization is terminated or revoked.  Performed at [facility], [place] [address] [place], [place] [address]   MRSA Next Gen by PCR, Nasal     Status: None   Collection Time: 08/06/21  9:38 PM   Specimen: Nasal Mucosa; Nasal Swab  Result Value Ref Range Status   MRSA by PCR Next Gen NOT DETECTED NOT DETECTED Final    Comment: (NOTE) The GeneXpert MRSA Assay (FDA approved for NASAL specimens only), is one component of a comprehensive MRSA colonization surveillance program. It is not intended to diagnose MRSA infection nor to guide  or monitor treatment for MRSA infections. Test performance is not FDA approved in patients less than 49 years old. Performed at [facility], [place] [address], [place], [place] [address]      Discharge Instructions:   Discharge Instructions     (HEART FAILURE PATIENTS) Call MD:  Anytime you have any of the following symptoms: 1) 3 pound weight gain in 24 hours or 5 pounds in 1 week 2) shortness of breath, with or without a dry hacking cough 3) swelling in the hands, feet or stomach 4) if you have to sleep on extra pillows at night in order to breathe.   Complete by: As directed    Avoid straining   Complete by: As directed    Diet - low sodium heart healthy   Complete by: As directed    Diet Carb Modified   Complete by: As directed    Discharge instructions   Complete by: As directed    Follow-up with your primary care provider in 1 week.  Check blood work at that time.  Continue to take medications as  prescribed.  Follow-up with cardiology as has been scheduled.  No overexertion.  Fluid restriction 1500 MLS per day.  Low-salt diet.   Heart Failure patients record your daily weight using the same scale at the same time of day   Complete by: As directed    Increase activity slowly   Complete by: As directed    STOP any activity that causes chest pain, shortness of breath, dizziness, sweating, or exessive weakness   Complete by: As directed       Allergies as of 08/09/2021       Reactions   Fentanyl Other (See Comments)   Behavioral changes   Gabapentin Other (See Comments)   ankle swells   Lisinopril Other (See Comments)   unknown   Lyrica [pregabalin] Other (See Comments)   unknown   Metformin Diarrhea   Propofol Other (See Comments)   Heart rate dropped        Medication List     STOP taking these medications    sacubitril-valsartan 49-51 MG Commonly known as: ENTRESTO Replaced by: sacubitril-valsartan 97-103 MG       TAKE these medications    apixaban 5 MG Tabs tablet Commonly known as: ELIQUIS Take 1 tablet (5 mg total) by mouth 2 (two) times daily.   atorvastatin 40 MG tablet Commonly known as: LIPITOR Take 40 mg by mouth at bedtime.   clopidogrel 75 MG tablet Commonly known as: PLAVIX Take 1 tablet (75 mg total) by mouth daily.   dapagliflozin propanediol 10 MG Tabs tablet Commonly known as: Farxiga Take 1 tablet (10 mg total) by mouth daily before breakfast.   escitalopram 20 MG tablet Commonly known as: LEXAPRO Take 20 mg by mouth daily.   furosemide 20 MG tablet Commonly known as: LASIX Take 1 tablet (20 mg total) by mouth daily.   glipiZIDE 5 MG tablet Commonly known as: GLUCOTROL Take 5 mg by mouth daily before breakfast.   isosorbide mononitrate 120 MG 24 hr tablet Commonly known as: IMDUR Take 1 tablet (120 mg total) by mouth daily. What changed:  medication strength how much to take   losartan 100 MG tablet Commonly known  as: COZAAR Take 100 mg by mouth daily.   metoprolol tartrate 25 MG tablet Commonly known as: LOPRESSOR Take 1 tablet (25 mg total) by mouth 2 (two) times daily.   Ozempic (0.25 or 0.5 MG/DOSE) 2  MG/1.5ML Sopn Generic drug: Semaglutide(0.25 or 0.5MG /DOS) Inject 0.5 mg into the skin once a week.   pantoprazole 40 MG tablet Commonly known as: PROTONIX Take 40 mg by mouth daily.   potassium chloride SA 20 MEQ tablet Commonly known as: KLOR-CON Take 20 mEq by mouth daily.   sacubitril-valsartan 97-103 MG Commonly known as: ENTRESTO Take 1 tablet by mouth 2 (two) times daily. Replaces: sacubitril-valsartan 49-51 MG   spironolactone 25 MG tablet Commonly known as: ALDACTONE Take 0.5 tablets (12.5 mg total) by mouth daily.   SYSTANE FREE OP Place 1 drop into both eyes daily as needed (dry eyes).        Follow-up Information     [name], MD Follow up in 1 week(s).   Specialty: Internal Medicine Contact information: [address] [place] [address] [phone]         [name], MD .   Specialties: Cardiology, Radiology Contact information: [address] [place] [address] [phone]                  Time coordinating discharge: 39 minutes  Signed:  Zyliah Schier  [facility] 08/09/2021, 10:10 AM

## 2021-08-09 NOTE — Progress Notes (Signed)
Occupational Therapy Treatment Patient Details Name: Albert Powell MRN: [id number] DOB: [birth year] Today's Date: 08/09/2021   History of present illness Pt is a 80 y/o male admitted secondary to increased SOB. Thought to be secondary to CHF exacerbation. PMH includes dCHF, a fib, DM, CKD, COPD, and HTN.   OT comments  Pt reports not sleeping well last night, he is hopeful to go home today. Pt performed toileting and 2 grooming activities in standing with supervision. Educated in energy conservation strategies and provided handout to reinforce. Pt returned to bed for nap.    Recommendations for follow up therapy are one component of a multi-disciplinary discharge planning process, led by the attending physician.  Recommendations may be updated based on patient status, additional functional criteria and insurance authorization.    Follow Up Recommendations  No OT follow up    Assistance Recommended at Discharge None  Equipment Recommendations  None recommended by OT    Recommendations for Other Services      Precautions / Restrictions Precautions Precautions: Fall Restrictions Weight Bearing Restrictions: No       Mobility Bed Mobility Overal bed mobility: Modified Independent                  Transfers Overall transfer level: Needs assistance Equipment used: None Transfers: Sit to/from Stand Sit to Stand: Supervision           General transfer comment: supervised for safety and lines     Balance Overall balance assessment: Needs assistance   Sitting balance-Leahy Scale: Good     Standing balance support: No upper extremity supported Standing balance-Leahy Scale: Good                             ADL either performed or assessed with clinical judgement   ADL Overall ADL's : Needs assistance/impaired     Grooming: Wash/dry hands;Oral care;Standing;Supervision/safety                   Toilet Transfer:  Supervision/safety;Ambulation;Regular Toilet   Toileting- Architect and Hygiene: Supervision/safety;Sit to/from stand       Functional mobility during ADLs: Supervision/safety General ADL Comments: Educated in energy conservation and provided written handout.    Extremity/Trunk Assessment              Vision       Perception     Praxis      Cognition Arousal/Alertness: Awake/alert Behavior During Therapy: WFL for tasks assessed/performed Overall Cognitive Status: Within Functional Limits for tasks assessed                                            Exercises     Shoulder Instructions       General Comments      Pertinent Vitals/ Pain       Pain Assessment: No/denies pain  Home Living                                          Prior Functioning/Environment              Frequency  Min 2X/week        Progress Toward Goals  OT Goals(current goals can now be found in  the care plan section)  Progress towards OT goals: Progressing toward goals  Acute Rehab OT Goals OT Goal Formulation: With patient Time For Goal Achievement: 08/21/21 Potential to Achieve Goals: Good  Plan Discharge plan remains appropriate    Co-evaluation                 AM-PAC OT "6 Clicks" Daily Activity     Outcome Measure   Help from another person eating meals?: None Help from another person taking care of personal grooming?: A Little Help from another person toileting, which includes using toliet, bedpan, or urinal?: A Little Help from another person bathing (including washing, rinsing, drying)?: A Little Help from another person to put on and taking off regular upper body clothing?: None Help from another person to put on and taking off regular lower body clothing?: None 6 Click Score: 21    End of Session    OT Visit Diagnosis: Other (comment) (decreased activity tolerance)   Activity Tolerance Patient  tolerated treatment well   Patient Left in bed;with call bell/phone within reach   Nurse Communication          Time: [id number] OT Time Calculation (min): 16 min  Charges: OT General Charges $OT Visit: 1 Visit OT Treatments $Self Care/Home Management : 8-22 mins  Nestor Lewandowsky, OTR/L Acute Rehabilitation Services Pager: [phone] Office: [phone]  Malka So 08/09/2021, 9:20 AM

## 2021-08-09 NOTE — Plan of Care (Signed)

## 2021-08-11 DIAGNOSIS — I5033 Acute on chronic diastolic (congestive) heart failure: Secondary | ICD-10-CM | POA: Diagnosis not present

## 2021-08-11 DIAGNOSIS — I130 Hypertensive heart and chronic kidney disease with heart failure and stage 1 through stage 4 chronic kidney disease, or unspecified chronic kidney disease: Secondary | ICD-10-CM | POA: Diagnosis not present

## 2021-08-11 DIAGNOSIS — K219 Gastro-esophageal reflux disease without esophagitis: Secondary | ICD-10-CM | POA: Diagnosis not present

## 2021-08-11 DIAGNOSIS — Z7901 Long term (current) use of anticoagulants: Secondary | ICD-10-CM | POA: Diagnosis not present

## 2021-08-11 DIAGNOSIS — E1136 Type 2 diabetes mellitus with diabetic cataract: Secondary | ICD-10-CM | POA: Diagnosis not present

## 2021-08-11 DIAGNOSIS — E1122 Type 2 diabetes mellitus with diabetic chronic kidney disease: Secondary | ICD-10-CM | POA: Diagnosis not present

## 2021-08-11 DIAGNOSIS — Z7985 Long-term (current) use of injectable non-insulin antidiabetic drugs: Secondary | ICD-10-CM | POA: Diagnosis not present

## 2021-08-11 DIAGNOSIS — Z7902 Long term (current) use of antithrombotics/antiplatelets: Secondary | ICD-10-CM | POA: Diagnosis not present

## 2021-08-11 DIAGNOSIS — F32A Depression, unspecified: Secondary | ICD-10-CM | POA: Diagnosis not present

## 2021-08-11 DIAGNOSIS — Z9181 History of falling: Secondary | ICD-10-CM | POA: Diagnosis not present

## 2021-08-11 DIAGNOSIS — Z87891 Personal history of nicotine dependence: Secondary | ICD-10-CM | POA: Diagnosis not present

## 2021-08-11 DIAGNOSIS — M519 Unspecified thoracic, thoracolumbar and lumbosacral intervertebral disc disorder: Secondary | ICD-10-CM | POA: Diagnosis not present

## 2021-08-11 DIAGNOSIS — E669 Obesity, unspecified: Secondary | ICD-10-CM | POA: Diagnosis not present

## 2021-08-11 DIAGNOSIS — I2510 Atherosclerotic heart disease of native coronary artery without angina pectoris: Secondary | ICD-10-CM | POA: Diagnosis not present

## 2021-08-11 DIAGNOSIS — E785 Hyperlipidemia, unspecified: Secondary | ICD-10-CM | POA: Diagnosis not present

## 2021-08-11 DIAGNOSIS — Z7984 Long term (current) use of oral hypoglycemic drugs: Secondary | ICD-10-CM | POA: Diagnosis not present

## 2021-08-11 DIAGNOSIS — N1832 Chronic kidney disease, stage 3b: Secondary | ICD-10-CM | POA: Diagnosis not present

## 2021-08-11 DIAGNOSIS — E1151 Type 2 diabetes mellitus with diabetic peripheral angiopathy without gangrene: Secondary | ICD-10-CM | POA: Diagnosis not present

## 2021-08-11 DIAGNOSIS — J449 Chronic obstructive pulmonary disease, unspecified: Secondary | ICD-10-CM | POA: Diagnosis not present

## 2021-08-11 DIAGNOSIS — I480 Paroxysmal atrial fibrillation: Secondary | ICD-10-CM | POA: Diagnosis not present

## 2021-08-15 ENCOUNTER — Telehealth: Payer: Self-pay

## 2021-08-15 ENCOUNTER — Ambulatory Visit (HOSPITAL_COMMUNITY)
Admit: 2021-08-15 | Discharge: 2021-08-15 | Disposition: A | Discharge status: Home / Self Care | Payer: Medicare Other | Attending: Family Medicine | Admitting: Family Medicine

## 2021-08-15 ENCOUNTER — Encounter (HOSPITAL_COMMUNITY): Payer: Self-pay

## 2021-08-15 VITALS — BP 168/92 | HR 77 | Wt 226.4 lb

## 2021-08-15 DIAGNOSIS — R0609 Other forms of dyspnea: Secondary | ICD-10-CM | POA: Diagnosis not present

## 2021-08-15 DIAGNOSIS — I5032 Chronic diastolic (congestive) heart failure: Secondary | ICD-10-CM | POA: Insufficient documentation

## 2021-08-15 DIAGNOSIS — E1151 Type 2 diabetes mellitus with diabetic peripheral angiopathy without gangrene: Secondary | ICD-10-CM | POA: Insufficient documentation

## 2021-08-15 DIAGNOSIS — I10 Essential (primary) hypertension: Secondary | ICD-10-CM

## 2021-08-15 DIAGNOSIS — I701 Atherosclerosis of renal artery: Secondary | ICD-10-CM | POA: Diagnosis not present

## 2021-08-15 DIAGNOSIS — Z955 Presence of coronary angioplasty implant and graft: Secondary | ICD-10-CM | POA: Diagnosis not present

## 2021-08-15 DIAGNOSIS — I110 Hypertensive heart disease with heart failure: Secondary | ICD-10-CM | POA: Insufficient documentation

## 2021-08-15 DIAGNOSIS — Z79899 Other long term (current) drug therapy: Secondary | ICD-10-CM | POA: Insufficient documentation

## 2021-08-15 DIAGNOSIS — Z7901 Long term (current) use of anticoagulants: Secondary | ICD-10-CM | POA: Insufficient documentation

## 2021-08-15 DIAGNOSIS — I480 Paroxysmal atrial fibrillation: Secondary | ICD-10-CM | POA: Diagnosis not present

## 2021-08-15 DIAGNOSIS — E785 Hyperlipidemia, unspecified: Secondary | ICD-10-CM | POA: Insufficient documentation

## 2021-08-15 DIAGNOSIS — Z7984 Long term (current) use of oral hypoglycemic drugs: Secondary | ICD-10-CM | POA: Diagnosis not present

## 2021-08-15 DIAGNOSIS — I2510 Atherosclerotic heart disease of native coronary artery without angina pectoris: Secondary | ICD-10-CM | POA: Diagnosis not present

## 2021-08-15 LAB — BASIC METABOLIC PANEL
Anion gap: 11 (ref 5–15)
BUN: 16 mg/dL (ref 8–23)
CO2: 23 mmol/L (ref 22–32)
Calcium: 9.2 mg/dL (ref 8.9–10.3)
Chloride: 103 mmol/L (ref 98–111)
Creatinine, Ser: 1.48 mg/dL — ABNORMAL HIGH (ref 0.61–1.24)
GFR, Estimated: 48 mL/min — ABNORMAL LOW (ref >60)
Glucose, Bld: 187 mg/dL — ABNORMAL HIGH (ref 70–99)
Potassium: 3.8 mmol/L (ref 3.5–5.1)
Sodium: 137 mmol/L (ref 135–145)

## 2021-08-15 MED ORDER — HYDRALAZINE HCL 50 MG PO TABS
50.0000 mg | ORAL_TABLET | Freq: Once | ORAL | Status: AC
  Administered 2021-08-15: 50 mg via ORAL

## 2021-08-15 MED ORDER — FUROSEMIDE 40 MG PO TABS: 40.0000 mg | ORAL_TABLET | Freq: Every day | ORAL | 2 refills | Status: DC

## 2021-08-15 NOTE — Telephone Encounter (Signed)
Heart Failure Nurse Navigator Progress Note  Attempted to call numbers listed in chart to remind of HV TOC appt today--unable to reach. No opportunity to leave vm. Home/spouse number disconnected.   Ozella Rocks, MSN, RN Heart Failure Nurse Navigator [phone]

## 2021-08-15 NOTE — Progress Notes (Signed)
HEART IMPACT TRANSITIONS OF CARE    PCP: Dr Fara Olden Primary Cardiologist: Dr Gwenlyn Found   HPI: [name]  is 80 y.o. with a history  PAF, CAD s/p stenting x2 to RCA in 2003 with residual LAD disease, carotid artery disease s/p carotid artery stenosis s/p LICA endarterectomy [id number], peripheral arterial disease, hypertension, hyperlipidemia, T2DM, renal artery stenosis.   2003 required PCI x2 to RCA   Admitted 12/2020 for hypertensive emergency, flash pulmonary edema, A. fib with RVR due to poor medication adherence.  Home meds were restarted, given one dose of IV lasix 40mg  and he returned back to baseline.    Admitted [id number] with A/C diastolic heart failure and HTN urgency. Diuresed with IV lasix.   Initially seen in [facility] 03/21/21 and was started on farxiga and lasix was switched to as needed.   Admitted 05/2021 with hypertensive urgency and AMS. CT head showed no acute findings but it did show chronic left temporal lobe malacia. EEG was done that showed normal weeks routine normal seizures oriented to formal discharge. Hospital course complicated by AKI. Given IVF with improvement.   Admitted 07/2021 A/C HFpEF and hypertension. BNP >4500. Diuresed with IV lasix and transitioned to lasix 20 mg po daily. Discharge weigth 218 pounds.   He returns for post hospital follow up with his wife. Overall feeling fine. Has ongoing leg weakness.  Using rolling walker. SOB with exertion. His wife says he is very sedentary. Denies PND/Orthopnea. Appetite ok. No fever or chills. He has not been weighing at home. He did not take any his medications today.   Cardiac Testing  07/2021 Echo EF 60-65% RV normal   02/2021 Echo EF 60-65% Grade I DD RV normal   ROS: All systems negative except as listed in HPI, PMH and Problem List.  SH:  Social History   Socioeconomic History   Marital status: Married    Spouse name: Albert Powell   Number of children: 1   Years of education: Not on file   Highest  education level: High school graduate  Occupational History   Occupation: retired  Tobacco Use   Smoking status: Former    Packs/day: 1.50    Years: 57.00    Pack years: 85.50    Types: Cigarettes    Quit date: 04/06/2013    Years since quitting: 8.3   Smokeless tobacco: Never   Tobacco comments:    pt states that he is using the vapor cigs  Vaping Use   Vaping Use: Every day   Start date: 09/15/2016   Substances: Nicotine  Substance and Sexual Activity   Alcohol use: Yes    Alcohol/week: 1.0 standard drink    Types: 1 Glasses of wine per week    Comment: "very little"   Drug use: No   Sexual activity: Not on file  Other Topics Concern   Not on file  Social History Narrative   Not on file   Social Determinants of Health   Financial Resource Strain: Low Risk    Difficulty of Paying Living Expenses: Not very hard  Food Insecurity: No Food Insecurity   Worried About Running Out of Food in the Last Year: Never true   Ran Out of Food in the Last Year: Never true  Transportation Needs: No Transportation Needs   Lack of Transportation (Medical): No   Lack of Transportation (Non-Medical): No  Physical Activity: Not on file  Stress: Not on file  Social Connections: Not on file  Intimate  Partner Violence: Not on file    FH:  Family History  Problem Relation Age of Onset   Heart disease Father        heart attack at 84   Heart attack Father    Hyperlipidemia Father    Colonic polyp Mother        that bled out   COPD Mother    Diabetes Mother    Heart disease Sister    Colonic polyp Sister    Stroke Maternal Grandfather    Heart attack Paternal Grandfather    Other Neg Hx        hypogonadism    Past Medical History:  Diagnosis Date   Atrial fibrillation ([name]) 03/30/2020   Bradycardia, sinus 02/18/2013   CAD (coronary artery disease)    stent to RCA 2003 also had 40% lesion at that time; NUCLEAR STRESS TEST, 01/16/2010 - normal  now with cath 80-90% stenosis in  LAD, will try medical therapy if no improvement PCI   Carotid artery disease ([name])    Cataract    CHF (congestive heart failure) ([name])    Claudication ([name])    LEA DUPLEX, 07/07/2008 - Normal   COPD (chronic obstructive pulmonary disease) ([name])    DDD (degenerative disc disease) 2008   Diabetes mellitus    GERD (gastroesophageal reflux disease)    H/O hiatal hernia    History of colonoscopy 2004   finding of tics and AMV only no polpys   Hyperlipidemia 02/05/2013   Hypertension    Onychomycosis    Rosacea    Tobacco abuse 02/05/2013    Current Outpatient Medications  Medication Sig Dispense Refill   apixaban (ELIQUIS) 5 MG TABS tablet Take 1 tablet (5 mg total) by mouth 2 (two) times daily. 60 tablet 2   atorvastatin (LIPITOR) 40 MG tablet Take 40 mg by mouth at bedtime.      clopidogrel (PLAVIX) 75 MG tablet Take 1 tablet (75 mg total) by mouth daily. 30 tablet 1   dapagliflozin propanediol (FARXIGA) 10 MG TABS tablet Take 1 tablet (10 mg total) by mouth daily before breakfast. 30 tablet 2   escitalopram (LEXAPRO) 20 MG tablet Take 20 mg by mouth daily.     furosemide (LASIX) 20 MG tablet Take 1 tablet (20 mg total) by mouth daily. 30 tablet 0   glipiZIDE (GLUCOTROL) 5 MG tablet Take 5 mg by mouth daily before breakfast.     isosorbide mononitrate (IMDUR) 60 MG 24 hr tablet Take 2 tablets (120 mg total) by mouth daily. 60 tablet 2   losartan (COZAAR) 100 MG tablet Take 100 mg by mouth daily.     metoprolol tartrate (LOPRESSOR) 25 MG tablet Take 1 tablet (25 mg total) by mouth 2 (two) times daily. 60 tablet 1   OZEMPIC, 0.25 OR 0.5 MG/DOSE, 2 MG/1.5ML SOPN Inject 0.5 mg into the skin once a week.     pantoprazole (PROTONIX) 40 MG tablet Take 40 mg by mouth daily.      Polyethyl Glycol-Propyl Glycol (SYSTANE FREE OP) Place 1 drop into both eyes daily as needed (dry eyes).     potassium chloride SA (KLOR-CON) 20 MEQ tablet Take 20 mEq by mouth daily.     sacubitril-valsartan  (ENTRESTO) 97-103 MG Take 1 tablet by mouth 2 (two) times daily. 60 tablet 2   spironolactone (ALDACTONE) 25 MG tablet Take 1/2 tablet (12.5 mg total) by mouth daily. 90 tablet 2   Current Facility-Administered Medications  Medication Dose Route Frequency  Provider Last Rate Last Admin   sodium chloride flush (NS) 0.9 % injection 3 mL  3 mL Intravenous Q12H Lorretta Harp, MD        Vitals:   08/15/21 1506 08/15/21 1556  BP: (!) 208/96 (!) 168/92  Pulse: 77   SpO2: 96%   Weight: 102.7 kg (226 lb 6.4 oz)     PHYSICAL EXAM: General:  Walked in the clinic with a rolling walker. No resp difficulty HEENT: normal Neck: supple. JVP 8-9 . Carotids 2+ bilaterally; no bruits. No lymphadenopathy or thryomegaly appreciated. Cor: PMI normal. Regular rate & rhythm. No rubs, gallops or murmurs. Lungs: clear Abdomen: obese, soft, nontender, nondistended. No hepatosplenomegaly. No bruits or masses. Good bowel sounds. Extremities: no cyanosis, clubbing, rash, edema Neuro: alert & orientedx3, cranial nerves grossly intact. Moves all 4 extremities w/o difficulty. Affect pleasant.   ECG: SR 76 bpm personally reviewed.    ASSESSMENT & PLAN:  Chronic HFpEF -Echo EF 60-65%. Grade I DD.  -NYHA III. Volume mildly elevated. Increase lasix to 40 mg daily.  -His main issues is that he needs to take his medications as prescribed.  - Instructed to take lopressor, entresto, farxiga, spiro, potassium, and lasix as soon as he gets home.  - Instructed to take BP and weight daily. Also needs to record daily.  - Check BMET  - Instructed to stop losartan.   2. HTN, uncontrolled -Given 50 mg hydralazine in the office.  - See above.   3. PAF -In SR. Continue current regimen.  -Continue eliquis 5 mg twice a day    Discussed weigh  and record daily and take BP daily.   Follow up in 2 weeks to reassess volume and BP. If he looks good will refer  back to Dr Gwenlyn Found.    Jesseka Drinkard NP-C  5:07  PM

## 2021-08-15 NOTE — Patient Instructions (Addendum)
EKG done today.  Labs done today. We will contact you only if your labs are abnormal.   WHEN YOU GET HOME TAKE THE FOLLOWING MEDICATIONS   Metoprolol (Lopressor) Entresto Spironolactone Lasix Potassium Farxiga Eliquis  No other medication changes were made. Please continue all current medications as prescribed.  Your physician has requested that you regularly monitor and record your blood pressure and weight readings at home. Please use the same machine at the same time of day to check your readings and record them to bring to your follow-up visit.  Your physician recommends that you schedule a follow-up appointment in: 2 weeks  If you have any questions or concerns before your next appointment please send [place] a message through [place] or call our office at [phone].    TO LEAVE A MESSAGE FOR THE NURSE SELECT OPTION 2, PLEASE LEAVE A MESSAGE INCLUDING: YOUR NAME DATE OF BIRTH CALL BACK NUMBER REASON FOR CALL**this is important as we prioritize the call backs  YOU WILL RECEIVE A CALL BACK THE SAME DAY AS LONG AS YOU CALL BEFORE 4:00 PM   Do the following things EVERYDAY: Weigh yourself in the morning before breakfast. Write it down and keep it in a log. Take your medicines as prescribed Eat low salt foods--Limit salt (sodium) to 2000 mg per day.  Stay as active as you can everyday Limit all fluids for the day to less than 2 liters   At the [facility], you and your health needs are our priority. As part of our continuing mission to provide you with exceptional heart care, we have created designated Provider Care Teams. These Care Teams include your primary Cardiologist (physician) and Advanced Practice Providers (APPs- Physician Assistants and Nurse Practitioners) who all work together to provide you with the care you need, when you need it.   You may see any of the following providers on your designated Care Team at your next follow up: Dr Arvilla Meres Dr Carron Curie, NP Robbie Lis, [place] Karle Plumber, PharmD   Please be sure to bring in all your medications bottles to every appointment.

## 2021-08-16 ENCOUNTER — Telehealth: Payer: Self-pay | Admitting: Physician Assistant

## 2021-08-28 ENCOUNTER — Telehealth (HOSPITAL_COMMUNITY): Payer: Self-pay

## 2021-08-28 NOTE — Progress Notes (Signed)
HEART IMPACT TRANSITIONS OF CARE    PCP: Dr Fara Olden Primary Cardiologist: Dr Gwenlyn Found   HPI: [name]  is 80 y.o. with a history PAF, CAD s/p stenting x2 to RCA in 2003 with residual LAD disease, carotid artery disease s/p carotid artery stenosis s/p LICA endarterectomy [id number], peripheral arterial disease, hypertension, hyperlipidemia, T2DM, renal artery stenosis.   2003 required PCI x2 to RCA   Admitted 12/2020 for hypertensive emergency, flash pulmonary edema, A. fib with RVR due to poor medication adherence.  Home meds were restarted, given one dose of IV lasix 40mg  and he returned back to baseline.    Admitted [id number] with A/C diastolic heart failure and HTN urgency. Diuresed with IV lasix.   Initially seen in [facility] 03/21/21 and was started on farxiga and lasix was switched to as needed.   Admitted 05/2021 with hypertensive urgency and AMS. CT head showed no acute findings but it did show chronic left temporal lobe malacia. EEG was done that showed normal weeks routine normal seizures oriented to formal discharge. Hospital course complicated by AKI. Given IVF with improvement.   Admitted 07/2021 A/C HFpEF and hypertension. BNP >4500. Diuresed with IV lasix and transitioned to lasix 20 mg po daily. Discharge weigth 218 pounds. Referred to [facility] clinic.   Had initial TOC visit on 12/1. Volume was mildly elevated. Instructed to increase lasix to 40 mg daily. His BP was also elevated but he had not yet taken his meds.   Returns for 2 week f/u. Here w/ his wife. Volume better. Wt down 2 lb since previous visit. Breathing overall improved but more tired and fatigued over the last 3 days. Wife has also noticed change. Moving around more slowly. Little energy. Denies CP.   EKG shows sinus bradycardia, 48 bpm w/ 1st degree AV block. Denies dizziness. No syncope/ near syncope. BP moderately elevated, 154/58 but usually better controlled at home.   Recent labs reviewed, TSH 11/22 was mildly  elevated at 6.63. No follow -up studies    Cardiac Testing  07/2021 Echo EF 60-65% RV normal   02/2021 Echo EF 60-65% Grade I DD RV normal   ROS: All systems negative except as listed in HPI, PMH and Problem List.  SH:  Social History   Socioeconomic History   Marital status: Married    Spouse name: Glenda Mcloone   Number of children: 1   Years of education: Not on file   Highest education level: High school graduate  Occupational History   Occupation: retired  Tobacco Use   Smoking status: Former    Packs/day: 1.50    Years: 57.00    Pack years: 85.50    Types: Cigarettes    Quit date: 04/06/2013    Years since quitting: 8.4   Smokeless tobacco: Never   Tobacco comments:    pt states that he is using the vapor cigs  Vaping Use   Vaping Use: Every day   Start date: 09/15/2016   Substances: Nicotine  Substance and Sexual Activity   Alcohol use: Yes    Alcohol/week: 1.0 standard drink    Types: 1 Glasses of wine per week    Comment: "very little"   Drug use: No   Sexual activity: Not on file  Other Topics Concern   Not on file  Social History Narrative   Not on file   Social Determinants of Health   Financial Resource Strain: Low Risk    Difficulty of Paying Living Expenses: Not very  hard  Food Insecurity: No Food Insecurity   Worried About Programme researcher, broadcasting/film/video in the Last Year: Never true   Ran Out of Food in the Last Year: Never true  Transportation Needs: No Transportation Needs   Lack of Transportation (Medical): No   Lack of Transportation (Non-Medical): No  Physical Activity: Not on file  Stress: Not on file  Social Connections: Not on file  Intimate Partner Violence: Not on file    FH:  Family History  Problem Relation Age of Onset   Heart disease Father        heart attack at 73   Heart attack Father    Hyperlipidemia Father    Colonic polyp Mother        that bled out   COPD Mother    Diabetes Mother    Heart disease Sister    Colonic  polyp Sister    Stroke Maternal Grandfather    Heart attack Paternal Grandfather    Other Neg Hx        hypogonadism    Past Medical History:  Diagnosis Date   Atrial fibrillation ([name]) 03/30/2020   Bradycardia, sinus 02/18/2013   CAD (coronary artery disease)    stent to RCA 2003 also had 40% lesion at that time; NUCLEAR STRESS TEST, 01/16/2010 - normal  now with cath 80-90% stenosis in LAD, will try medical therapy if no improvement PCI   Carotid artery disease ([name])    Cataract    CHF (congestive heart failure) ([name])    Claudication ([name])    LEA DUPLEX, 07/07/2008 - Normal   COPD (chronic obstructive pulmonary disease) ([name])    DDD (degenerative disc disease) 2008   Diabetes mellitus    GERD (gastroesophageal reflux disease)    H/O hiatal hernia    History of colonoscopy 2004   finding of tics and AMV only no polpys   Hyperlipidemia 02/05/2013   Hypertension    Onychomycosis    Rosacea    Tobacco abuse 02/05/2013    Current Outpatient Medications  Medication Sig Dispense Refill   apixaban (ELIQUIS) 5 MG TABS tablet Take 1 tablet (5 mg total) by mouth 2 (two) times daily. 60 tablet 2   atorvastatin (LIPITOR) 40 MG tablet Take 40 mg by mouth at bedtime.      clopidogrel (PLAVIX) 75 MG tablet Take 1 tablet (75 mg total) by mouth daily. 30 tablet 1   dapagliflozin propanediol (FARXIGA) 10 MG TABS tablet Take 1 tablet (10 mg total) by mouth daily before breakfast. 30 tablet 2   escitalopram (LEXAPRO) 20 MG tablet Take 20 mg by mouth daily.     furosemide (LASIX) 40 MG tablet Take 1 tablet (40 mg total) by mouth daily. 30 tablet 2   glipiZIDE (GLUCOTROL) 5 MG tablet Take 5 mg by mouth daily before breakfast.     isosorbide mononitrate (IMDUR) 60 MG 24 hr tablet Take 2 tablets (120 mg total) by mouth daily. 60 tablet 2   metoprolol tartrate (LOPRESSOR) 25 MG tablet Take 1 tablet (25 mg total) by mouth 2 (two) times daily. 60 tablet 1   OZEMPIC, 0.25 OR 0.5 MG/DOSE, 2 MG/1.5ML  SOPN Inject 0.5 mg into the skin once a week.     pantoprazole (PROTONIX) 40 MG tablet Take 40 mg by mouth daily.      Polyethyl Glycol-Propyl Glycol (SYSTANE FREE OP) Place 1 drop into both eyes daily as needed (dry eyes).     potassium chloride SA (KLOR-CON) 20  MEQ tablet Take 20 mEq by mouth daily.     sacubitril-valsartan (ENTRESTO) 97-103 MG Take 1 tablet by mouth 2 (two) times daily. 60 tablet 2   spironolactone (ALDACTONE) 25 MG tablet Take 1/2 tablet (12.5 mg total) by mouth daily. 90 tablet 2   Current Facility-Administered Medications  Medication Dose Route Frequency Provider Last Rate Last Admin   sodium chloride flush (NS) 0.9 % injection 3 mL  3 mL Intravenous Q12H [name], MD        Vitals:   08/29/21 1515  BP: (!) 164/58  Pulse: (!) 45  SpO2: 97%  Weight: 102 kg (224 lb 12.8 oz)     PHYSICAL EXAM: General:  Well appearing, mod obese, ambulating w/ walker. No respiratory difficulty HEENT: normal Neck: supple. no JVD. Carotids 2+ bilat; no bruits. No lymphadenopathy or thyromegaly appreciated. Cor: PMI nondisplaced. Regular rate & rhythm. No rubs, gallops or murmurs. Lungs: clear Abdomen: soft, nontender, nondistended. No hepatosplenomegaly. No bruits or masses. Good bowel sounds. Extremities: no cyanosis, clubbing, rash, edema Neuro: alert & oriented x 3, cranial nerves grossly intact. moves all 4 extremities w/o difficulty. Affect pleasant.   ECG: Sinus bradycardia 48 bpm w/ 1st degree AV block    ASSESSMENT & PLAN:  Chronic HFpEF - Echo 9/23 EF 60-65%. Grade I DD.  - NYHA III. Volume status ok. Euvolemic  - Continue Entresto 97-103 mg bid - Continue spiro 12.5 mg daily - Continue Farxiga 10 mg daily  - Reduce Metoprolol to 12.5 mg bid given bradycardia - Continue Imdur 120 mg daily   2. HTN - mildly elevated in clinic today but reports good control at home - Entresto 97-103 bpm - Imdur 60 mg daily  - Spiro 12.5 mg daily  - Reduce metoprolol  to 12.5 mg bid given bradycardia. - instructed to monitor BP closely over the next few weeks w/ ? blocker dose reduction. If needed, may need addition of amlodipine   3. PAF - Sinus brady on EKG today  - Reduce metoprolol per above  - Continue eliquis 5 mg twice a day. Denies bleeding. Check CBC today   4. Bradycardia - EKG SB 48 bpm 1st Degree  AVB - suspect contributing to fatigue  - reduce metoprolol to 12.5 mg bid - ? Hypothyroid (recent TSH elevated 6.6). Repeat TSH + Free T3/T4 - Place 7 day live Zio to further assess bradycardia  - keep f/u  w/ Dr. Allyson Sabal next month   Referred to HFSW (PCP, Medications, Transportation, ETOH Abuse, Drug Abuse, Insurance, Financial ): No  Refer to Pharmacy: No  Refer to Home Health: No  Refer to [facility]: No  Refer to General Cardiology: Yes Followed by Dr Allyson Sabal   Keep f/u w/ Dr. Allyson Sabal in 4 weeks.     Bettyanne Dittman PA-C  3:20 PM

## 2021-08-28 NOTE — Telephone Encounter (Signed)
Called to confirm Heart & Vascular Transitions of Care appointment at 3pm tomorrow (12/15). Patient reminded to bring all medications and pill box organizer with them. Confirmed patient has transportation. Gave directions, instructed to utilize valet parking.  Confirmed appointment prior to ending call.   Ozella Rocks, MSN, RN Heart Failure Nurse Navigator [phone]

## 2021-08-29 ENCOUNTER — Other Ambulatory Visit (HOSPITAL_COMMUNITY): Payer: Self-pay | Admitting: Cardiology

## 2021-08-29 ENCOUNTER — Other Ambulatory Visit: Payer: Self-pay

## 2021-08-29 ENCOUNTER — Encounter (HOSPITAL_COMMUNITY): Payer: Self-pay

## 2021-08-29 ENCOUNTER — Ambulatory Visit (HOSPITAL_COMMUNITY)
Admission: RE | Admit: 2021-08-29 | Discharge: 2021-08-29 | Disposition: A | Discharge status: Home / Self Care | Payer: Medicare Other | Source: Ambulatory Visit | Attending: Cardiology | Admitting: Cardiology

## 2021-08-29 VITALS — BP 154/58 | HR 45 | Wt 224.8 lb

## 2021-08-29 DIAGNOSIS — R569 Unspecified convulsions: Secondary | ICD-10-CM | POA: Insufficient documentation

## 2021-08-29 DIAGNOSIS — R001 Bradycardia, unspecified: Secondary | ICD-10-CM | POA: Diagnosis not present

## 2021-08-29 DIAGNOSIS — I480 Paroxysmal atrial fibrillation: Secondary | ICD-10-CM | POA: Insufficient documentation

## 2021-08-29 DIAGNOSIS — E785 Hyperlipidemia, unspecified: Secondary | ICD-10-CM | POA: Diagnosis not present

## 2021-08-29 DIAGNOSIS — I4891 Unspecified atrial fibrillation: Secondary | ICD-10-CM

## 2021-08-29 DIAGNOSIS — E1136 Type 2 diabetes mellitus with diabetic cataract: Secondary | ICD-10-CM | POA: Diagnosis not present

## 2021-08-29 DIAGNOSIS — Z7901 Long term (current) use of anticoagulants: Secondary | ICD-10-CM | POA: Insufficient documentation

## 2021-08-29 DIAGNOSIS — I2510 Atherosclerotic heart disease of native coronary artery without angina pectoris: Secondary | ICD-10-CM | POA: Insufficient documentation

## 2021-08-29 DIAGNOSIS — I440 Atrioventricular block, first degree: Secondary | ICD-10-CM | POA: Insufficient documentation

## 2021-08-29 DIAGNOSIS — Z79899 Other long term (current) drug therapy: Secondary | ICD-10-CM | POA: Diagnosis not present

## 2021-08-29 DIAGNOSIS — Z955 Presence of coronary angioplasty implant and graft: Secondary | ICD-10-CM | POA: Insufficient documentation

## 2021-08-29 DIAGNOSIS — I110 Hypertensive heart disease with heart failure: Secondary | ICD-10-CM | POA: Diagnosis not present

## 2021-08-29 DIAGNOSIS — I495 Sick sinus syndrome: Secondary | ICD-10-CM | POA: Diagnosis not present

## 2021-08-29 DIAGNOSIS — E039 Hypothyroidism, unspecified: Secondary | ICD-10-CM | POA: Diagnosis not present

## 2021-08-29 DIAGNOSIS — I701 Atherosclerosis of renal artery: Secondary | ICD-10-CM | POA: Insufficient documentation

## 2021-08-29 DIAGNOSIS — E1151 Type 2 diabetes mellitus with diabetic peripheral angiopathy without gangrene: Secondary | ICD-10-CM | POA: Insufficient documentation

## 2021-08-29 DIAGNOSIS — I5032 Chronic diastolic (congestive) heart failure: Secondary | ICD-10-CM | POA: Diagnosis not present

## 2021-08-29 LAB — TSH: TSH: 4.548 u[IU]/mL — ABNORMAL HIGH (ref 0.350–4.500)

## 2021-08-29 LAB — BASIC METABOLIC PANEL
Anion gap: 8 (ref 5–15)
BUN: 25 mg/dL — ABNORMAL HIGH (ref 8–23)
CO2: 25 mmol/L (ref 22–32)
Calcium: 9.2 mg/dL (ref 8.9–10.3)
Chloride: 103 mmol/L (ref 98–111)
Creatinine, Ser: 1.5 mg/dL — ABNORMAL HIGH (ref 0.61–1.24)
GFR, Estimated: 47 mL/min — ABNORMAL LOW (ref >60)
Glucose, Bld: 141 mg/dL — ABNORMAL HIGH (ref 70–99)
Potassium: 4.4 mmol/L (ref 3.5–5.1)
Sodium: 136 mmol/L (ref 135–145)

## 2021-08-29 LAB — CBC
HCT: 42.9 % (ref 39.0–52.0)
Hemoglobin: 13.7 g/dL (ref 13.0–17.0)
MCH: 29.6 pg (ref 26.0–34.0)
MCHC: 31.9 g/dL (ref 30.0–36.0)
MCV: 92.7 fL (ref 80.0–100.0)
Platelets: 227 10*3/uL (ref 150–400)
RBC: 4.63 MIL/uL (ref 4.22–5.81)
RDW: 14.1 % (ref 11.5–15.5)
WBC: 8.1 10*3/uL (ref 4.0–10.5)
nRBC: 0 % (ref 0.0–0.2)

## 2021-08-29 LAB — T4, FREE: Free T4: 0.59 ng/dL — ABNORMAL LOW (ref 0.61–1.12)

## 2021-08-29 MED ORDER — METOPROLOL TARTRATE 25 MG PO TABS: 12.5000 mg | ORAL_TABLET | Freq: Two times a day (BID) | ORAL | 0 refills | Status: DC

## 2021-08-29 NOTE — Patient Instructions (Signed)
EKG done today.  Labs done today. We will contact you only if your labs are abnormal.  DECREASE Metoprolol to 12.5mg  (1/2 tablet) by mouth 2 times daily.   No other medication changes were made. Please continue all current medications as prescribed.  Your provider has recommended that  you wear a Zio Patch for 7 days.  This monitor will record your heart rhythm for our review.  IF you have any symptoms while wearing the monitor please press the button.  If you have any issues with the patch or you notice a red or orange light on it please call the company at [phone].  Once you remove the patch please mail it back to the company as soon as possible so we can get the results.  Thank you for allowing [place] to provide your heart failure care after your recent hospitalization. Please follow-up with Dr. Allyson Sabal next month as scheduled.

## 2021-08-30 ENCOUNTER — Telehealth (HOSPITAL_COMMUNITY): Payer: Self-pay

## 2021-08-30 MED ORDER — LEVOTHYROXINE SODIUM 25 MCG PO TABS: 25.0000 ug | ORAL_TABLET | Freq: Every day | ORAL | 1 refills | Status: DC

## 2021-08-30 NOTE — Telephone Encounter (Signed)
-----   Message from [place], [place] sent at 08/30/2021 12:48 PM EST ----- Let pt know that is thyroid function is abnormal. This is likely the cause of his fatigue and slow heart rate.  Start levothyroxine (synthroid) 25 mcg daily and advise patient make appt w/ PCP in the next week so that this can be followed. His PCP will need to repeat labs in several weeks.

## 2021-08-30 NOTE — Telephone Encounter (Signed)
Pt aware of results and recommendations. Verbalized understanding. Pt will contact pcp to follow.

## 2021-08-31 LAB — T3, FREE: T3, Free: 2.9 pg/mL (ref 2.0–4.4)

## 2021-09-03 DIAGNOSIS — E1159 Type 2 diabetes mellitus with other circulatory complications: Secondary | ICD-10-CM | POA: Diagnosis not present

## 2021-09-03 DIAGNOSIS — E1165 Type 2 diabetes mellitus with hyperglycemia: Secondary | ICD-10-CM | POA: Diagnosis not present

## 2021-09-03 DIAGNOSIS — I2510 Atherosclerotic heart disease of native coronary artery without angina pectoris: Secondary | ICD-10-CM | POA: Diagnosis not present

## 2021-09-03 DIAGNOSIS — I5032 Chronic diastolic (congestive) heart failure: Secondary | ICD-10-CM | POA: Diagnosis not present

## 2021-09-03 DIAGNOSIS — G4733 Obstructive sleep apnea (adult) (pediatric): Secondary | ICD-10-CM | POA: Diagnosis not present

## 2021-09-03 DIAGNOSIS — J449 Chronic obstructive pulmonary disease, unspecified: Secondary | ICD-10-CM | POA: Diagnosis not present

## 2021-09-03 DIAGNOSIS — I739 Peripheral vascular disease, unspecified: Secondary | ICD-10-CM | POA: Diagnosis not present

## 2021-09-03 DIAGNOSIS — I10 Essential (primary) hypertension: Secondary | ICD-10-CM | POA: Diagnosis not present

## 2021-09-03 DIAGNOSIS — I480 Paroxysmal atrial fibrillation: Secondary | ICD-10-CM | POA: Diagnosis not present

## 2021-09-03 DIAGNOSIS — R0600 Dyspnea, unspecified: Secondary | ICD-10-CM | POA: Diagnosis not present

## 2021-09-10 ENCOUNTER — Telehealth (HOSPITAL_COMMUNITY): Payer: Self-pay | Admitting: Surgery

## 2021-09-10 ENCOUNTER — Encounter (HOSPITAL_COMMUNITY): Payer: Medicare Other

## 2021-09-10 DIAGNOSIS — Z9181 History of falling: Secondary | ICD-10-CM | POA: Diagnosis not present

## 2021-09-10 DIAGNOSIS — E1151 Type 2 diabetes mellitus with diabetic peripheral angiopathy without gangrene: Secondary | ICD-10-CM | POA: Diagnosis not present

## 2021-09-10 DIAGNOSIS — E669 Obesity, unspecified: Secondary | ICD-10-CM | POA: Diagnosis not present

## 2021-09-10 DIAGNOSIS — Z87891 Personal history of nicotine dependence: Secondary | ICD-10-CM | POA: Diagnosis not present

## 2021-09-10 DIAGNOSIS — I2510 Atherosclerotic heart disease of native coronary artery without angina pectoris: Secondary | ICD-10-CM | POA: Diagnosis not present

## 2021-09-10 DIAGNOSIS — I5033 Acute on chronic diastolic (congestive) heart failure: Secondary | ICD-10-CM | POA: Diagnosis not present

## 2021-09-10 DIAGNOSIS — N1832 Chronic kidney disease, stage 3b: Secondary | ICD-10-CM | POA: Diagnosis not present

## 2021-09-10 DIAGNOSIS — E1122 Type 2 diabetes mellitus with diabetic chronic kidney disease: Secondary | ICD-10-CM | POA: Diagnosis not present

## 2021-09-10 DIAGNOSIS — Z7902 Long term (current) use of antithrombotics/antiplatelets: Secondary | ICD-10-CM | POA: Diagnosis not present

## 2021-09-10 DIAGNOSIS — Z7985 Long-term (current) use of injectable non-insulin antidiabetic drugs: Secondary | ICD-10-CM | POA: Diagnosis not present

## 2021-09-10 DIAGNOSIS — Z7984 Long term (current) use of oral hypoglycemic drugs: Secondary | ICD-10-CM | POA: Diagnosis not present

## 2021-09-10 DIAGNOSIS — E785 Hyperlipidemia, unspecified: Secondary | ICD-10-CM | POA: Diagnosis not present

## 2021-09-10 DIAGNOSIS — J449 Chronic obstructive pulmonary disease, unspecified: Secondary | ICD-10-CM | POA: Diagnosis not present

## 2021-09-10 DIAGNOSIS — F32A Depression, unspecified: Secondary | ICD-10-CM | POA: Diagnosis not present

## 2021-09-10 DIAGNOSIS — I480 Paroxysmal atrial fibrillation: Secondary | ICD-10-CM | POA: Diagnosis not present

## 2021-09-10 DIAGNOSIS — K219 Gastro-esophageal reflux disease without esophagitis: Secondary | ICD-10-CM | POA: Diagnosis not present

## 2021-09-10 DIAGNOSIS — E1136 Type 2 diabetes mellitus with diabetic cataract: Secondary | ICD-10-CM | POA: Diagnosis not present

## 2021-09-10 DIAGNOSIS — Z7901 Long term (current) use of anticoagulants: Secondary | ICD-10-CM | POA: Diagnosis not present

## 2021-09-10 DIAGNOSIS — M519 Unspecified thoracic, thoracolumbar and lumbosacral intervertebral disc disorder: Secondary | ICD-10-CM | POA: Diagnosis not present

## 2021-09-10 DIAGNOSIS — I130 Hypertensive heart and chronic kidney disease with heart failure and stage 1 through stage 4 chronic kidney disease, or unspecified chronic kidney disease: Secondary | ICD-10-CM | POA: Diagnosis not present

## 2021-09-10 NOTE — Telephone Encounter (Signed)
I called and spoke with patient regarding his Zio after company contacted me to say it had not been started or applied.  He tells me that he needs assistance with placing the monitor.  I have scheduled him for a nurse appt in [facility] tomorrow to have the nursing staff apply the [facility] monitor.

## 2021-09-11 ENCOUNTER — Ambulatory Visit (HOSPITAL_COMMUNITY)
Admission: RE | Admit: 2021-09-11 | Discharge: 2021-09-11 | Disposition: A | Discharge status: Home / Self Care | Payer: Medicare Other | Source: Ambulatory Visit | Attending: Cardiology | Admitting: Cardiology

## 2021-09-11 ENCOUNTER — Other Ambulatory Visit: Payer: Self-pay

## 2021-09-11 DIAGNOSIS — Z0130 Encounter for examination of blood pressure without abnormal findings: Secondary | ICD-10-CM | POA: Insufficient documentation

## 2021-09-13 DIAGNOSIS — E1151 Type 2 diabetes mellitus with diabetic peripheral angiopathy without gangrene: Secondary | ICD-10-CM | POA: Diagnosis not present

## 2021-09-13 DIAGNOSIS — I130 Hypertensive heart and chronic kidney disease with heart failure and stage 1 through stage 4 chronic kidney disease, or unspecified chronic kidney disease: Secondary | ICD-10-CM | POA: Diagnosis not present

## 2021-09-13 DIAGNOSIS — E1122 Type 2 diabetes mellitus with diabetic chronic kidney disease: Secondary | ICD-10-CM | POA: Diagnosis not present

## 2021-09-13 DIAGNOSIS — I2510 Atherosclerotic heart disease of native coronary artery without angina pectoris: Secondary | ICD-10-CM | POA: Diagnosis not present

## 2021-09-13 DIAGNOSIS — I4891 Unspecified atrial fibrillation: Secondary | ICD-10-CM | POA: Diagnosis not present

## 2021-09-13 DIAGNOSIS — I5033 Acute on chronic diastolic (congestive) heart failure: Secondary | ICD-10-CM | POA: Diagnosis not present

## 2021-09-13 DIAGNOSIS — N1832 Chronic kidney disease, stage 3b: Secondary | ICD-10-CM | POA: Diagnosis not present

## 2021-10-08 ENCOUNTER — Ambulatory Visit (INDEPENDENT_AMBULATORY_CARE_PROVIDER_SITE_OTHER): Payer: Medicare Other | Admitting: Cardiovascular Disease

## 2021-10-08 ENCOUNTER — Other Ambulatory Visit: Payer: Self-pay

## 2021-10-08 ENCOUNTER — Encounter: Payer: Self-pay | Admitting: Cardiovascular Disease

## 2021-10-08 DIAGNOSIS — I2510 Atherosclerotic heart disease of native coronary artery without angina pectoris: Secondary | ICD-10-CM | POA: Diagnosis not present

## 2021-10-08 DIAGNOSIS — I5033 Acute on chronic diastolic (congestive) heart failure: Secondary | ICD-10-CM

## 2021-10-08 DIAGNOSIS — I6522 Occlusion and stenosis of left carotid artery: Secondary | ICD-10-CM | POA: Diagnosis not present

## 2021-10-08 DIAGNOSIS — E782 Mixed hyperlipidemia: Secondary | ICD-10-CM | POA: Diagnosis not present

## 2021-10-08 DIAGNOSIS — I480 Paroxysmal atrial fibrillation: Secondary | ICD-10-CM | POA: Diagnosis not present

## 2021-10-08 DIAGNOSIS — I739 Peripheral vascular disease, unspecified: Secondary | ICD-10-CM

## 2021-10-08 NOTE — Assessment & Plan Note (Signed)
History of PAF maintaining sinus rhythm on Eliquis oral anticoagulation. 

## 2021-10-08 NOTE — Assessment & Plan Note (Signed)
History of hyperlipidemia on atorvastatin 40 mg a day with lipid profile performed 04/15/2021 revealing total cholesterol 185, LDL 97 and HDL 49.

## 2021-10-08 NOTE — Progress Notes (Signed)
10/08/2021 [name]   August 15, 1941  [id number]  Primary Physician Leeroy Cha, MD Primary Cardiologist: Lorretta Harp MD Garret Reddish, [place], [place]  HPI:  Albert Powell is a 81 y.o.   moderately overweight married Caucasian male with known history of CAD. I last saw in the office 06/14/2021.[name]  He is accompanied by his wife Albert Powell today.  He is  status post stenting with 2 stents to RCA in 2003 and residual LAD, hypertension and hyperlipidemia and diabetes mellitus on diet, presented to the ER because of jaw discomfort with chest pressure for 2-3 days. Patient's symptoms usually happen in the night. .  Patient was negative for myocardial infarction he had a LexiScan Myoview 02/07/13 which was negative for ischemia, but due to similarities of his prior coronary disease and the symptoms and multiple risk factors for coronary disease including family history hypertension diabetes known coronary artery disease patient was scheduled for cardiac catheterization with Dr. Gwenlyn Found. Revealing 80-90% focal distal LAD disease and a 1.5-1.7 mm vessel left circumflex nondominant with a 90% AV groove stenosis in a small vessel there was a moderate to large obtuse marginal branch with a 60% proximal segmental calcified stenosis. The RCA had widely patent stents. EF was 60%. Due to the small size of the vessels and the negative nuclear stress test that are very filled medical therapy was the patient's best option.  Patient was seen back in followup last week with a heart rate of 48 symptomatic with weakness feeling very tired. He still had some shortness of breath but no jaw pain which is his anginal equivalent. Medications were adjusted because Lopressor actually had him hold it one day and then he was started back half a tablet daily he felt better so he is now resumed half tablet twice a day.  Today's back for followup initially his heart rate was 60 after he walked in the room but after sitting for  10-15 minutes EKG revealed sinus bradycardia rate of 49 but no acute changes otherwise. His blood pressure was improved from his last visit.  Additionally he denies any shortness of breath he denies any jaw pain.  Since he was seen by Cecilie Kicks 02/24/13 and his beta blockers were adjusted he no longer is bradycardic or symptomatic. He denies chest pain or shortness of breath. I do hear a carotid bruit on exam and cardiac echo Doppler studies on him.  I performed carotid Dopplers on him 04/11/13 which revealed a moderately high-grade left ICA stenosis. He is neurologically asymptomatic. Based on the intermediate nature of his Doppler study is unclear to me whether he has a lesion that can be followed conservatively by ultrasound or requires surgical revascularization. Because of this I decided to proceed with carotid angiography to further define the degree of stenosis.this was performed on 04/25/13 revealing a 95% proximal left internal carotid artery stenosis which suddenly underwent endarterectomy by Dr. Trula Slade on 06/02/13.I also performed angiography on his aortoiliac Arteries revealing 90% calcified right extrailiac artery stenosis.I performed diamondback or rotational atherectomy, PT and stenting of his right external iliac artery on 10/20/13. He had an excellent angiographic and clinical result. His post procedure Dopplers have improved. He did have a small pseudoaneurysm in his right groin which was successfully compressed at the hospital and has remained closed.  Since I saw me year ago his major complaints have been lifestyle limiting back and hip pain. He's had imaging studies that suggest spinal stenosis and wishes to undergo  decompressive laminectomy by Dr. Rolena Infante in the upcoming future.. He had lower extremity Dopplers performed in our office 04/08/17 that showed a right ABI of 0.85 and a left aBI of 0.96.   I repeated his lower extremity arterial Doppler studies which showed some progression of  disease however he ultimately underwent angiography by Dr. Trula Slade 03/30/2018, 2 months after his Dopplers, and had covered stenting of both iliac arteries.  He continues to have similar symptoms as he did prior to his intervention.  He denies chest pain or shortness of breath.   I performed coronary angiography on him the setting of chest pain with A. fib with RVR 04/02/2020 revealing a patent RCA stent with an intermediate lesion just beyond the stented segment that was negative by DFR.  Medical therapy is recommended.  He did convert spontaneously to sinus rhythm and is on Eliquis oral anticoagulation in addition to Plavix.   He had lower extremity arterial Doppler studies performed 08/15/2020 revealing a right ABI of 0.26 and a left of 0.80.  He had monophasic right iliac waveforms and significant disease in his right common femoral artery and SFA.  He does have lifestyle limiting right calf claudication wishes to proceed with angiography.  I performed peripheral angiography on him 08/27/2020 revealing patent bilateral common iliac artery stents and right extrailiac artery stent.  He did have 50 to 70% calcified stenoses in his left extrailiac artery and common femoral artery as well as high-grade diffuse calcified 90% calcified stenoses in the distal right extrailiac artery and common femoral artery as well as a focal 99% mid right SFA stenosis.  My intent was to send him back to Dr. Trula Slade for considerations for surgical revascularization, endarterectomy with patch angioplasty but however this never occurred.  He was recently admitted with hypertension diastolic heart failure on 06/06/2021 and was diuresed.  He was discharged home 4 days later.  He is now watching his diet including salt restriction and is not on oral diuretic.   He was hospitalized again 08/05/2021 for volume overload and acute on chronic diastolic heart failure and was diuresed.  He saw Darrick Grinder, NP and Ellen Henri PA-C in the  office in early December.  His diuretics were adjusted.  He denies chest pain or shortness of breath.  Current Meds  Medication Sig   apixaban (ELIQUIS) 5 MG TABS tablet Take 1 tablet (5 mg total) by mouth 2 (two) times daily.   atorvastatin (LIPITOR) 40 MG tablet Take 40 mg by mouth at bedtime.    clopidogrel (PLAVIX) 75 MG tablet Take 1 tablet (75 mg total) by mouth daily.   dapagliflozin propanediol (FARXIGA) 10 MG TABS tablet Take 1 tablet (10 mg total) by mouth daily before breakfast.   escitalopram (LEXAPRO) 20 MG tablet Take 20 mg by mouth daily.   furosemide (LASIX) 40 MG tablet Take 1 tablet (40 mg total) by mouth daily.   glipiZIDE (GLUCOTROL) 5 MG tablet Take 5 mg by mouth daily before breakfast.   isosorbide mononitrate (IMDUR) 60 MG 24 hr tablet Take 2 tablets (120 mg total) by mouth daily.   levothyroxine (SYNTHROID) 75 MCG tablet 1 tablet in the morning on an empty stomach   metoprolol tartrate (LOPRESSOR) 25 MG tablet Take 0.5 tablets (12.5 mg total) by mouth 2 (two) times daily.   OZEMPIC, 0.25 OR 0.5 MG/DOSE, 2 MG/1.5ML SOPN Inject 0.5 mg into the skin once a week.   pantoprazole (PROTONIX) 40 MG tablet Take 40 mg by mouth daily.  Polyethyl Glycol-Propyl Glycol (SYSTANE FREE OP) Place 1 drop into both eyes daily as needed (dry eyes).   potassium chloride SA (KLOR-CON) 20 MEQ tablet Take 20 mEq by mouth daily.   sacubitril-valsartan (ENTRESTO) 97-103 MG Take 1 tablet by mouth 2 (two) times daily.   spironolactone (ALDACTONE) 25 MG tablet Take 1/2 tablet (12.5 mg total) by mouth daily.   [DISCONTINUED] levothyroxine (SYNTHROID) 25 MCG tablet Take 1 tablet (25 mcg total) by mouth daily before breakfast.   Current Facility-Administered Medications for the 10/08/21 encounter (Office Visit) with Lorretta Harp, MD  Medication   sodium chloride flush (NS) 0.9 % injection 3 mL     Allergies  Allergen Reactions   Fentanyl Other (See Comments)    Behavioral changes    Gabapentin Other (See Comments)    ankle swells   Lisinopril Other (See Comments)    unknown   Lyrica [Pregabalin] Other (See Comments)    unknown   Metformin Diarrhea   Propofol Other (See Comments)    Heart rate dropped    Social History   Socioeconomic History   Marital status: Married    Spouse name: Albert Powell   Number of children: 1   Years of education: Not on file   Highest education level: High school graduate  Occupational History   Occupation: retired  Tobacco Use   Smoking status: Former    Packs/day: 1.50    Years: 57.00    Pack years: 85.50    Types: Cigarettes    Quit date: 04/06/2013    Years since quitting: 8.5   Smokeless tobacco: Never   Tobacco comments:    pt states that he is using the vapor cigs  Vaping Use   Vaping Use: Every day   Start date: 09/15/2016   Substances: Nicotine  Substance and Sexual Activity   Alcohol use: Yes    Alcohol/week: 1.0 standard drink    Types: 1 Glasses of wine per week    Comment: "very little"   Drug use: No   Sexual activity: Not on file  Other Topics Concern   Not on file  Social History Narrative   Not on file   Social Determinants of Health   Financial Resource Strain: Low Risk    Difficulty of Paying Living Expenses: Not very hard  Food Insecurity: No Food Insecurity   Worried About Running Out of Food in the Last Year: Never true   Ran Out of Food in the Last Year: Never true  Transportation Needs: No Transportation Needs   Lack of Transportation (Medical): No   Lack of Transportation (Non-Medical): No  Physical Activity: Not on file  Stress: Not on file  Social Connections: Not on file  Intimate Partner Violence: Not on file     Review of Systems: General: negative for chills, fever, night sweats or weight changes.  Cardiovascular: negative for chest pain, dyspnea on exertion, edema, orthopnea, palpitations, paroxysmal nocturnal dyspnea or shortness of breath Dermatological: negative  for rash Respiratory: negative for cough or wheezing Urologic: negative for hematuria Abdominal: negative for nausea, vomiting, diarrhea, bright red blood per rectum, melena, or hematemesis Neurologic: negative for visual changes, syncope, or dizziness All other systems reviewed and are otherwise negative except as noted above.    Blood pressure 130/73, pulse (!) 52, height 5\' 9"  (1.753 m), weight 225 lb 6.4 oz (102.2 kg), SpO2 99 %.  General appearance: alert and no distress Neck: no adenopathy, no carotid bruit, no JVD, supple, symmetrical, trachea  midline, and thyroid not enlarged, symmetric, no tenderness/mass/nodules Lungs: clear to auscultation bilaterally Heart: regular rate and rhythm, S1, S2 normal, no murmur, click, rub or gallop Extremities: extremities normal, atraumatic, no cyanosis or edema Pulses: 2+ and symmetric Skin: Skin color, texture, turgor normal. No rashes or lesions Neurologic: Grossly normal  EKG not performed today  ASSESSMENT AND PLAN:   Hyperlipidemia History of hyperlipidemia on atorvastatin 40 mg a day with lipid profile performed 04/15/2021 revealing total cholesterol 185, LDL 97 and HDL 49.  CAD in native artery History of CAD status post RCA stenting by myself in 2003 with residual LAD disease.  He was catheterized again in 2014 revealing 80 to 90% focal distal LAD stenosis and a small vessel as well as AV groove stenosis in a small vessel as well.  The RCA stent was widely patent.  I performed cardiac catheterization on him 04/02/2020 revealing a patent RCA stent with an intermediate lesion just beyond the stented segment that was negative by DFR.  Medical therapy was recommended.  He denies chest pain or shortness of breath.  Carotid artery disease ([name]) History of carotid artery disease status post cerebral angiography by myself 04/25/2013 revealing a 95% proximal left internal carotid artery stenosis.  I referred him to Dr. Trula Slade who performed  elective left carotid endarterectomy at my request 06/02/2013.  We will recheck carotid Doppler studies.  PAD (peripheral artery disease) ([name]) Complex history of PAD status post orbital atherectomy, PTA and stenting of the right external iliac artery by myself 10/20/2013.  He had bilateral iliac stenting by Dr. Trula Slade 03/30/2018.  Artery angiogram him 08/27/2020 revealing patent common and external iliac artery stents on the right.  He did have a 50 to 70% calcified stenosis in the left external iliac artery as well as high-grade calcified distal right common femoral artery stenosis.  He had a 99% focal mid right SFA stenosis as well.  He currently denies claudication.  Acute on chronic diastolic heart failure [facility]) Mr. Liden has had multiple admissions for acute on chronic diastolic heart failure.  He was hospitalized 06/06/2021 for 3 to 4 days and was diuresed.  He was hospitalized again 08/05/2021 for 3 to 4 days and was diuresed as well.  He was seen in the heart failure clinic by Ellen Henri PA-C on 08/29/2021 and his diuretics were adjusted.  He is aware of salt restriction.  His discharge weight was 218.  He currently weighs 225 pounds.  I told him to weigh himself every other day.  If his weight exceeds 230 pounds he is to call our office and be seen for adjustment of his diuretics.  PAF (paroxysmal atrial fibrillation) ([name]) History of PAF maintaining sinus rhythm on Eliquis oral anticoagulation.     Lorretta Harp MD FACP,FACC,FAHA, [facility] 10/08/2021 4:11 PM

## 2021-10-08 NOTE — Assessment & Plan Note (Addendum)
History of carotid artery disease status post cerebral angiography by myself 04/25/2013 revealing a 95% proximal left internal carotid artery stenosis.  I referred him to Dr. [name] who performed elective left carotid endarterectomy at my request 06/02/2013.  We will recheck carotid Doppler studies.

## 2021-10-08 NOTE — Patient Instructions (Signed)
Medication Instructions:  Your physician recommends that you continue on your current medications as directed. Please refer to the Current Medication list given to you today.  *If you need a refill on your cardiac medications before your next appointment, please call your pharmacy*   Testing/Procedures: Your physician has requested that you have a carotid duplex. This test is an ultrasound of the carotid arteries in your neck. It looks at blood flow through these arteries that supply the brain with blood. Allow one hour for this exam. There are no restrictions or special instructions. This procedure is done at [address] [facility].    Follow-Up: At [facility], you and your health needs are our priority.  As part of our continuing mission to provide you with exceptional heart care, we have created designated Provider Care Teams.  These Care Teams include your primary Cardiologist (physician) and Advanced Practice Providers (APPs -  Physician Assistants and Nurse Practitioners) who all work together to provide you with the care you need, when you need it.  We recommend signing up for the patient portal called "MyChart".  Sign up information is provided on this After Visit Summary.  MyChart is used to connect with patients for Virtual Visits (Telemedicine).  Patients are able to view lab/test results, encounter notes, upcoming appointments, etc.  Non-urgent messages can be sent to your provider as well.   To learn more about what you can do with MyChart, go to [email or web address].    Your next appointment:   3 month(s)  The format for your next appointment:   In Person  Provider:   [name], FNP, Micah Flesher, PA-C, Marjie Skiff, PA-C, Juanda Crumble, PA-C, Joni Reining, DNP, ANP, or Azalee Course, PA-C      Then, Nanetta Batty, MD will plan to see you again in 12 month(s).

## 2021-10-08 NOTE — Assessment & Plan Note (Signed)
Albert Powell has had multiple admissions for acute on chronic diastolic heart failure.  He was hospitalized 06/06/2021 for 3 to 4 days and was diuresed.  He was hospitalized again 08/05/2021 for 3 to 4 days and was diuresed as well.  He was seen in the heart failure clinic by Boyce Medici PA-C on 08/29/2021 and his diuretics were adjusted.  He is aware of salt restriction.  His discharge weight was 218.  He currently weighs 225 pounds.  I told him to weigh himself every other day.  If his weight exceeds 230 pounds he is to call our office and be seen for adjustment of his diuretics.

## 2021-10-08 NOTE — Assessment & Plan Note (Signed)
Complex history of PAD status post orbital atherectomy, PTA and stenting of the right external iliac artery by myself 10/20/2013.  He had bilateral iliac stenting by Dr. Trula Slade 03/30/2018.  Artery angiogram him 08/27/2020 revealing patent common and external iliac artery stents on the right.  He did have a 50 to 70% calcified stenosis in the left external iliac artery as well as high-grade calcified distal right common femoral artery stenosis.  He had a 99% focal mid right SFA stenosis as well.  He currently denies claudication.

## 2021-10-08 NOTE — Assessment & Plan Note (Signed)
History of CAD status post RCA stenting by myself in 2003 with residual LAD disease.  He was catheterized again in 2014 revealing 80 to 90% focal distal LAD stenosis and a small vessel as well as AV groove stenosis in a small vessel as well.  The RCA stent was widely patent.  I performed cardiac catheterization on him 04/02/2020 revealing a patent RCA stent with an intermediate lesion just beyond the stented segment that was negative by DFR.  Medical therapy was recommended.  He denies chest pain or shortness of breath.

## 2021-10-18 ENCOUNTER — Other Ambulatory Visit: Payer: Self-pay

## 2021-10-18 ENCOUNTER — Ambulatory Visit (HOSPITAL_COMMUNITY)
Admission: RE | Admit: 2021-10-18 | Discharge: 2021-10-18 | Disposition: A | Discharge status: Home / Self Care | Payer: Medicare Other | Source: Ambulatory Visit | Attending: Cardiology | Admitting: Cardiology

## 2021-10-18 DIAGNOSIS — E782 Mixed hyperlipidemia: Secondary | ICD-10-CM | POA: Diagnosis not present

## 2021-10-18 DIAGNOSIS — I6522 Occlusion and stenosis of left carotid artery: Secondary | ICD-10-CM | POA: Diagnosis not present

## 2021-10-18 DIAGNOSIS — I2510 Atherosclerotic heart disease of native coronary artery without angina pectoris: Secondary | ICD-10-CM

## 2021-10-28 NOTE — Addendum Note (Signed)
Encounter addended by: Crissie Figures, RN on: 10/28/2021 12:46 PM  Actions taken: Imaging Exam ended

## 2021-10-29 ENCOUNTER — Other Ambulatory Visit (HOSPITAL_BASED_OUTPATIENT_CLINIC_OR_DEPARTMENT_OTHER): Payer: Self-pay

## 2021-10-29 DIAGNOSIS — G4733 Obstructive sleep apnea (adult) (pediatric): Secondary | ICD-10-CM

## 2021-11-23 ENCOUNTER — Inpatient Hospital Stay (HOSPITAL_COMMUNITY): Payer: Medicare Other

## 2021-11-23 ENCOUNTER — Encounter (HOSPITAL_COMMUNITY): Payer: Self-pay

## 2021-11-23 ENCOUNTER — Inpatient Hospital Stay (HOSPITAL_COMMUNITY)
Admission: EM | Admit: 2021-11-23 | Discharge: 2021-11-27 | DRG: 291 | Disposition: A | Discharge status: Home Health | Payer: Medicare Other | Attending: Family Medicine | Admitting: Family Medicine

## 2021-11-23 ENCOUNTER — Emergency Department (HOSPITAL_COMMUNITY): Payer: Medicare Other

## 2021-11-23 DIAGNOSIS — Z794 Long term (current) use of insulin: Secondary | ICD-10-CM | POA: Diagnosis not present

## 2021-11-23 DIAGNOSIS — K219 Gastro-esophageal reflux disease without esophagitis: Secondary | ICD-10-CM | POA: Diagnosis present

## 2021-11-23 DIAGNOSIS — Z79899 Other long term (current) drug therapy: Secondary | ICD-10-CM | POA: Diagnosis not present

## 2021-11-23 DIAGNOSIS — I130 Hypertensive heart and chronic kidney disease with heart failure and stage 1 through stage 4 chronic kidney disease, or unspecified chronic kidney disease: Principal | ICD-10-CM | POA: Diagnosis present

## 2021-11-23 DIAGNOSIS — Z7902 Long term (current) use of antithrombotics/antiplatelets: Secondary | ICD-10-CM

## 2021-11-23 DIAGNOSIS — E782 Mixed hyperlipidemia: Secondary | ICD-10-CM | POA: Diagnosis not present

## 2021-11-23 DIAGNOSIS — J9601 Acute respiratory failure with hypoxia: Secondary | ICD-10-CM | POA: Diagnosis present

## 2021-11-23 DIAGNOSIS — Z833 Family history of diabetes mellitus: Secondary | ICD-10-CM

## 2021-11-23 DIAGNOSIS — E785 Hyperlipidemia, unspecified: Secondary | ICD-10-CM | POA: Diagnosis not present

## 2021-11-23 DIAGNOSIS — Z9049 Acquired absence of other specified parts of digestive tract: Secondary | ICD-10-CM

## 2021-11-23 DIAGNOSIS — I480 Paroxysmal atrial fibrillation: Secondary | ICD-10-CM | POA: Diagnosis not present

## 2021-11-23 DIAGNOSIS — E119 Type 2 diabetes mellitus without complications: Secondary | ICD-10-CM

## 2021-11-23 DIAGNOSIS — Z825 Family history of asthma and other chronic lower respiratory diseases: Secondary | ICD-10-CM

## 2021-11-23 DIAGNOSIS — I5033 Acute on chronic diastolic (congestive) heart failure: Secondary | ICD-10-CM | POA: Diagnosis not present

## 2021-11-23 DIAGNOSIS — I4891 Unspecified atrial fibrillation: Secondary | ICD-10-CM | POA: Diagnosis not present

## 2021-11-23 DIAGNOSIS — I2510 Atherosclerotic heart disease of native coronary artery without angina pectoris: Secondary | ICD-10-CM | POA: Diagnosis present

## 2021-11-23 DIAGNOSIS — I10 Essential (primary) hypertension: Secondary | ICD-10-CM | POA: Diagnosis present

## 2021-11-23 DIAGNOSIS — E669 Obesity, unspecified: Secondary | ICD-10-CM | POA: Diagnosis not present

## 2021-11-23 DIAGNOSIS — Z8249 Family history of ischemic heart disease and other diseases of the circulatory system: Secondary | ICD-10-CM

## 2021-11-23 DIAGNOSIS — N1832 Chronic kidney disease, stage 3b: Secondary | ICD-10-CM

## 2021-11-23 DIAGNOSIS — R110 Nausea: Secondary | ICD-10-CM | POA: Diagnosis not present

## 2021-11-23 DIAGNOSIS — F17210 Nicotine dependence, cigarettes, uncomplicated: Secondary | ICD-10-CM | POA: Diagnosis present

## 2021-11-23 DIAGNOSIS — Z20822 Contact with and (suspected) exposure to covid-19: Secondary | ICD-10-CM | POA: Diagnosis present

## 2021-11-23 DIAGNOSIS — E1165 Type 2 diabetes mellitus with hyperglycemia: Secondary | ICD-10-CM | POA: Diagnosis not present

## 2021-11-23 DIAGNOSIS — I739 Peripheral vascular disease, unspecified: Secondary | ICD-10-CM | POA: Diagnosis not present

## 2021-11-23 DIAGNOSIS — E1122 Type 2 diabetes mellitus with diabetic chronic kidney disease: Secondary | ICD-10-CM | POA: Diagnosis present

## 2021-11-23 DIAGNOSIS — I161 Hypertensive emergency: Secondary | ICD-10-CM | POA: Diagnosis present

## 2021-11-23 DIAGNOSIS — I110 Hypertensive heart disease with heart failure: Secondary | ICD-10-CM | POA: Diagnosis not present

## 2021-11-23 DIAGNOSIS — I5023 Acute on chronic systolic (congestive) heart failure: Secondary | ICD-10-CM | POA: Diagnosis present

## 2021-11-23 DIAGNOSIS — I517 Cardiomegaly: Secondary | ICD-10-CM | POA: Diagnosis not present

## 2021-11-23 DIAGNOSIS — Z79890 Hormone replacement therapy (postmenopausal): Secondary | ICD-10-CM | POA: Diagnosis not present

## 2021-11-23 DIAGNOSIS — Z888 Allergy status to other drugs, medicaments and biological substances status: Secondary | ICD-10-CM

## 2021-11-23 DIAGNOSIS — E1151 Type 2 diabetes mellitus with diabetic peripheral angiopathy without gangrene: Secondary | ICD-10-CM | POA: Diagnosis present

## 2021-11-23 DIAGNOSIS — J449 Chronic obstructive pulmonary disease, unspecified: Secondary | ICD-10-CM | POA: Diagnosis not present

## 2021-11-23 DIAGNOSIS — Z6833 Body mass index (BMI) 33.0-33.9, adult: Secondary | ICD-10-CM

## 2021-11-23 DIAGNOSIS — Z720 Tobacco use: Secondary | ICD-10-CM | POA: Diagnosis present

## 2021-11-23 DIAGNOSIS — Z955 Presence of coronary angioplasty implant and graft: Secondary | ICD-10-CM

## 2021-11-23 DIAGNOSIS — E1159 Type 2 diabetes mellitus with other circulatory complications: Secondary | ICD-10-CM | POA: Diagnosis present

## 2021-11-23 DIAGNOSIS — N1831 Chronic kidney disease, stage 3a: Secondary | ICD-10-CM | POA: Diagnosis present

## 2021-11-23 DIAGNOSIS — Z7901 Long term (current) use of anticoagulants: Secondary | ICD-10-CM

## 2021-11-23 DIAGNOSIS — Z885 Allergy status to narcotic agent status: Secondary | ICD-10-CM

## 2021-11-23 DIAGNOSIS — I959 Hypotension, unspecified: Secondary | ICD-10-CM | POA: Diagnosis not present

## 2021-11-23 DIAGNOSIS — R0602 Shortness of breath: Secondary | ICD-10-CM | POA: Diagnosis not present

## 2021-11-23 DIAGNOSIS — R000 Tachycardia, unspecified: Secondary | ICD-10-CM | POA: Diagnosis not present

## 2021-11-23 LAB — GLUCOSE, CAPILLARY
Glucose-Capillary: 132 mg/dL — ABNORMAL HIGH (ref 70–99)
Glucose-Capillary: 179 mg/dL — ABNORMAL HIGH (ref 70–99)

## 2021-11-23 LAB — COMPREHENSIVE METABOLIC PANEL WITH GFR
ALT: 27 U/L (ref 0–44)
AST: 20 U/L (ref 15–41)
Albumin: 3.1 g/dL — ABNORMAL LOW (ref 3.5–5.0)
Alkaline Phosphatase: 77 U/L (ref 38–126)
Anion gap: 11 (ref 5–15)
BUN: 18 mg/dL (ref 8–23)
CO2: 26 mmol/L (ref 22–32)
Calcium: 9 mg/dL (ref 8.9–10.3)
Chloride: 98 mmol/L (ref 98–111)
Creatinine, Ser: 1.71 mg/dL — ABNORMAL HIGH (ref 0.61–1.24)
GFR, Estimated: 40 mL/min — ABNORMAL LOW
Glucose, Bld: 203 mg/dL — ABNORMAL HIGH (ref 70–99)
Potassium: 3.4 mmol/L — ABNORMAL LOW (ref 3.5–5.1)
Sodium: 135 mmol/L (ref 135–145)
Total Bilirubin: 0.7 mg/dL (ref 0.3–1.2)
Total Protein: 6.6 g/dL (ref 6.5–8.1)

## 2021-11-23 LAB — RESP PANEL BY RT-PCR (FLU A&B, COVID) ARPGX2
Influenza A by PCR: NEGATIVE
Influenza B by PCR: NEGATIVE
SARS Coronavirus 2 by RT PCR: NEGATIVE

## 2021-11-23 LAB — CBC
HCT: 48.5 % (ref 39.0–52.0)
Hemoglobin: 16 g/dL (ref 13.0–17.0)
MCH: 29.5 pg (ref 26.0–34.0)
MCHC: 33 g/dL (ref 30.0–36.0)
MCV: 89.3 fL (ref 80.0–100.0)
Platelets: 284 10*3/uL (ref 150–400)
RBC: 5.43 MIL/uL (ref 4.22–5.81)
RDW: 13.7 % (ref 11.5–15.5)
WBC: 11.6 10*3/uL — ABNORMAL HIGH (ref 4.0–10.5)
nRBC: 0 % (ref 0.0–0.2)

## 2021-11-23 LAB — I-STAT CHEM 8, ED
BUN: 19 mg/dL (ref 8–23)
Calcium, Ion: 1.18 mmol/L (ref 1.15–1.40)
Chloride: 98 mmol/L (ref 98–111)
Creatinine, Ser: 1.6 mg/dL — ABNORMAL HIGH (ref 0.61–1.24)
Glucose, Bld: 199 mg/dL — ABNORMAL HIGH (ref 70–99)
HCT: 47 % (ref 39.0–52.0)
Hemoglobin: 16 g/dL (ref 13.0–17.0)
Potassium: 3.2 mmol/L — ABNORMAL LOW (ref 3.5–5.1)
Sodium: 137 mmol/L (ref 135–145)
TCO2: 29 mmol/L (ref 22–32)

## 2021-11-23 LAB — BRAIN NATRIURETIC PEPTIDE: B Natriuretic Peptide: >4500 pg/mL — ABNORMAL HIGH (ref 0.0–100.0)

## 2021-11-23 LAB — CBG MONITORING, ED: Glucose-Capillary: 252 mg/dL — ABNORMAL HIGH (ref 70–99)

## 2021-11-23 LAB — MAGNESIUM: Magnesium: 2 mg/dL (ref 1.7–2.4)

## 2021-11-23 MED ORDER — TRAZODONE HCL 50 MG PO TABS
25.0000 mg | ORAL_TABLET | Freq: Every evening | ORAL | Status: DC | PRN
  Administered 2021-11-24: 25 mg via ORAL
  Filled 2021-11-23: qty 1

## 2021-11-23 MED ORDER — SPIRONOLACTONE 12.5 MG HALF TABLET
12.5000 mg | ORAL_TABLET | Freq: Every day | ORAL | Status: DC
  Administered 2021-11-23 – 2021-11-27 (×5): 12.5 mg via ORAL
  Filled 2021-11-23 (×5): qty 1

## 2021-11-23 MED ORDER — DILTIAZEM HCL-DEXTROSE 125-5 MG/125ML-% IV SOLN (PREMIX): 5.0000 mg/h | INTRAVENOUS | Status: DC

## 2021-11-23 MED ORDER — POLYETHYLENE GLYCOL 3350 17 G PO PACK: 17.0000 g | PACK | Freq: Every day | ORAL | Status: DC | PRN

## 2021-11-23 MED ORDER — FUROSEMIDE 10 MG/ML IJ SOLN
40.0000 mg | Freq: Once | INTRAMUSCULAR | Status: AC
Start: 2021-11-23 — End: 2021-11-23
  Administered 2021-11-23: 40 mg via INTRAVENOUS
  Filled 2021-11-23: qty 4

## 2021-11-23 MED ORDER — ESCITALOPRAM OXALATE 10 MG PO TABS
20.0000 mg | ORAL_TABLET | Freq: Every day | ORAL | Status: DC
  Administered 2021-11-23 – 2021-11-27 (×5): 20 mg via ORAL
  Filled 2021-11-23 (×5): qty 2

## 2021-11-23 MED ORDER — ATORVASTATIN CALCIUM 40 MG PO TABS
40.0000 mg | ORAL_TABLET | Freq: Every day | ORAL | Status: DC
  Administered 2021-11-23 – 2021-11-26 (×4): 40 mg via ORAL
  Filled 2021-11-23 (×4): qty 1

## 2021-11-23 MED ORDER — LEVOTHYROXINE SODIUM 75 MCG PO TABS
75.0000 ug | ORAL_TABLET | Freq: Every day | ORAL | Status: DC
  Administered 2021-11-23: 75 ug via ORAL
  Filled 2021-11-23: qty 1

## 2021-11-23 MED ORDER — CLOPIDOGREL BISULFATE 75 MG PO TABS
75.0000 mg | ORAL_TABLET | Freq: Every day | ORAL | Status: DC
  Administered 2021-11-23 – 2021-11-27 (×5): 75 mg via ORAL
  Filled 2021-11-23 (×5): qty 1

## 2021-11-23 MED ORDER — LOSARTAN POTASSIUM 50 MG PO TABS: 100.0000 mg | ORAL_TABLET | Freq: Every day | ORAL | Status: DC

## 2021-11-23 MED ORDER — PANTOPRAZOLE SODIUM 40 MG PO TBEC
40.0000 mg | DELAYED_RELEASE_TABLET | Freq: Every day | ORAL | Status: DC
  Administered 2021-11-23 – 2021-11-27 (×5): 40 mg via ORAL
  Filled 2021-11-23 (×5): qty 1

## 2021-11-23 MED ORDER — LEVOTHYROXINE SODIUM 25 MCG PO TABS
25.0000 ug | ORAL_TABLET | Freq: Every day | ORAL | Status: DC
  Administered 2021-11-24 – 2021-11-27 (×4): 25 ug via ORAL
  Filled 2021-11-23 (×4): qty 1

## 2021-11-23 MED ORDER — HYDRALAZINE HCL 20 MG/ML IJ SOLN: 5.0000 mg | INTRAMUSCULAR | Status: DC | PRN

## 2021-11-23 MED ORDER — METOPROLOL TARTRATE 12.5 MG HALF TABLET
12.5000 mg | ORAL_TABLET | Freq: Two times a day (BID) | ORAL | Status: DC
  Administered 2021-11-23 (×2): 12.5 mg via ORAL
  Filled 2021-11-23 (×2): qty 1

## 2021-11-23 MED ORDER — MORPHINE SULFATE (PF) 2 MG/ML IV SOLN: 2.0000 mg | INTRAVENOUS | Status: DC | PRN

## 2021-11-23 MED ORDER — APIXABAN 5 MG PO TABS
5.0000 mg | ORAL_TABLET | Freq: Two times a day (BID) | ORAL | Status: DC
  Administered 2021-11-23 – 2021-11-27 (×9): 5 mg via ORAL
  Filled 2021-11-23 (×9): qty 1

## 2021-11-23 MED ORDER — OXYCODONE HCL 5 MG PO TABS: 5.0000 mg | ORAL_TABLET | ORAL | Status: DC | PRN

## 2021-11-23 MED ORDER — NITROGLYCERIN IN D5W 200-5 MCG/ML-% IV SOLN
0.0000 ug/min | INTRAVENOUS | Status: DC
  Administered 2021-11-23: 50 ug/min via INTRAVENOUS
  Filled 2021-11-23: qty 250

## 2021-11-23 MED ORDER — DILTIAZEM LOAD VIA INFUSION
10.0000 mg | Freq: Once | INTRAVENOUS | Status: DC
Start: 2021-11-23 — End: 2021-11-23
  Filled 2021-11-23: qty 10

## 2021-11-23 MED ORDER — INSULIN ASPART 100 UNIT/ML IJ SOLN
0.0000 [IU] | Freq: Three times a day (TID) | INTRAMUSCULAR | Status: DC
  Administered 2021-11-23: 8 [IU] via SUBCUTANEOUS
  Administered 2021-11-23: 2 [IU] via SUBCUTANEOUS
  Administered 2021-11-24: 5 [IU] via SUBCUTANEOUS
  Administered 2021-11-24 – 2021-11-25 (×3): 2 [IU] via SUBCUTANEOUS
  Administered 2021-11-25: 3 [IU] via SUBCUTANEOUS
  Administered 2021-11-25: 2 [IU] via SUBCUTANEOUS
  Administered 2021-11-26 – 2021-11-27 (×4): 3 [IU] via SUBCUTANEOUS

## 2021-11-23 MED ORDER — NICOTINE 14 MG/24HR TD PT24
14.0000 mg | MEDICATED_PATCH | Freq: Every day | TRANSDERMAL | Status: DC
  Administered 2021-11-23 – 2021-11-27 (×4): 14 mg via TRANSDERMAL
  Filled 2021-11-23 (×4): qty 1

## 2021-11-23 MED ORDER — SODIUM CHLORIDE 0.9% FLUSH
3.0000 mL | Freq: Two times a day (BID) | INTRAVENOUS | Status: DC
  Administered 2021-11-23 – 2021-11-27 (×9): 3 mL via INTRAVENOUS

## 2021-11-23 MED ORDER — SACUBITRIL-VALSARTAN 97-103 MG PO TABS: 1.0000 | ORAL_TABLET | Freq: Two times a day (BID) | ORAL | Status: DC

## 2021-11-23 MED ORDER — SACUBITRIL-VALSARTAN 49-51 MG PO TABS
1.0000 | ORAL_TABLET | Freq: Two times a day (BID) | ORAL | Status: DC
  Administered 2021-11-23 – 2021-11-27 (×8): 1 via ORAL
  Filled 2021-11-23 (×8): qty 1

## 2021-11-23 MED ORDER — FUROSEMIDE 10 MG/ML IJ SOLN
40.0000 mg | Freq: Two times a day (BID) | INTRAMUSCULAR | Status: DC
  Administered 2021-11-23 – 2021-11-27 (×8): 40 mg via INTRAVENOUS
  Filled 2021-11-23 (×8): qty 4

## 2021-11-23 MED ORDER — DOCUSATE SODIUM 100 MG PO CAPS
100.0000 mg | ORAL_CAPSULE | Freq: Two times a day (BID) | ORAL | Status: DC
  Administered 2021-11-23 – 2021-11-27 (×7): 100 mg via ORAL
  Filled 2021-11-23 (×8): qty 1

## 2021-11-23 MED ORDER — ONDANSETRON HCL 4 MG/2ML IJ SOLN: 4.0000 mg | Freq: Four times a day (QID) | INTRAMUSCULAR | Status: DC | PRN

## 2021-11-23 MED ORDER — SACUBITRIL-VALSARTAN 49-51 MG PO TABS
1.0000 | ORAL_TABLET | Freq: Two times a day (BID) | ORAL | Status: DC
  Administered 2021-11-23: 1 via ORAL
  Filled 2021-11-23 (×2): qty 1

## 2021-11-23 MED ORDER — ONDANSETRON HCL 4 MG PO TABS: 4.0000 mg | ORAL_TABLET | Freq: Four times a day (QID) | ORAL | Status: DC | PRN

## 2021-11-23 MED ORDER — ACETAMINOPHEN 325 MG PO TABS
650.0000 mg | ORAL_TABLET | Freq: Four times a day (QID) | ORAL | Status: DC | PRN
Start: 2021-11-23 — End: 2021-11-27

## 2021-11-23 MED ORDER — INSULIN ASPART 100 UNIT/ML IJ SOLN
0.0000 [IU] | Freq: Every day | INTRAMUSCULAR | Status: DC
  Administered 2021-11-26: 2 [IU] via SUBCUTANEOUS

## 2021-11-23 MED ORDER — BISACODYL 5 MG PO TBEC: 5.0000 mg | DELAYED_RELEASE_TABLET | Freq: Every day | ORAL | Status: DC | PRN

## 2021-11-23 MED ORDER — ACETAMINOPHEN 650 MG RE SUPP: 650.0000 mg | Freq: Four times a day (QID) | RECTAL | Status: DC | PRN

## 2021-11-23 NOTE — ED Provider Notes (Signed)
[facility] EMERGENCY DEPARTMENT Provider Note  CSN: [id number] Arrival date & time: 11/23/21 0600  Chief Complaint(s) Shortness of Breath  HPI Albert Powell is a 81 y.o. male with extensive past medical history listed below including A-fib on Eliquis, hypertension, hyperlipidemia, COPD, dCHF here for respiratory distress.  Patient began having shortness of breath yesterday which gradually worsened throughout the day.  He is endorsing worsening dyspnea on exertion and orthopnea.  Reports dry cough.  No fevers or chills.   Patient was brought in by EMS who noted he was in A-fib RVR with rates in the 130s to 180s.  He was given 10 mg bolus of Cardizem.  Was placed on nonrebreather initially by fire, weaned down to 8 L nasal cannula with EMS.  The history is provided by the patient.   Past Medical History Past Medical History:  Diagnosis Date   Atrial fibrillation ([name]) 03/30/2020   Bradycardia, sinus 02/18/2013   CAD (coronary artery disease)    stent to RCA 2003 also had 40% lesion at that time; NUCLEAR STRESS TEST, 01/16/2010 - normal  now with cath 80-90% stenosis in LAD, will try medical therapy if no improvement PCI   Carotid artery disease ([name])    Cataract    CHF (congestive heart failure) ([name])    Claudication ([name])    LEA DUPLEX, 07/07/2008 - Normal   COPD (chronic obstructive pulmonary disease) ([name])    DDD (degenerative disc disease) 2008   Diabetes mellitus    GERD (gastroesophageal reflux disease)    H/O hiatal hernia    History of colonoscopy 2004   finding of tics and AMV only no polpys   Hyperlipidemia 02/05/2013   Hypertension    Onychomycosis    Rosacea    Tobacco abuse 02/05/2013   Patient Active Problem List   Diagnosis Date Noted   Acute on chronic diastolic CHF (congestive heart failure) ([name]) 11/23/2021   PAF (paroxysmal atrial fibrillation) ([name]) 10/08/2021   Hypertensive urgency    Acute on chronic diastolic heart failure ([name])  21/30/8657   Acute metabolic encephalopathy    Acute exacerbation of CHF (congestive heart failure) ([name]) 06/06/2021   CHF (congestive heart failure) ([name]) 03/10/2021   Acute hypoxemic respiratory failure ([name]) 03/10/2021   Uncontrolled hypertension 03/10/2021   Hypertensive emergency 01/01/2021   ACS (acute coronary syndrome) ([name])    AKI (acute kidney injury) ([name])    Atrial fibrillation with RVR ([name]) 03/30/2020   Contusion of right hand 11/12/2018   Primary osteoarthritis of both first carpometacarpal joints 11/12/2018   Pain in joint of right shoulder 10/05/2018   Pain in right hand 10/05/2018   Hematoma 03/30/2018   Degeneration of lumbar intervertebral disc 12/21/2017   Lumbar radiculopathy 12/21/2017   Vertigo 04/09/2017   Hypogonadism in male 12/07/2014   Screening for prostate cancer 12/07/2014   Claudication of right lower extremity ([name]) 10/20/2013   Claudication in peripheral vascular disease- Rt leg 09/20/2013   Obesity (BMI 30-39.9)- negative sleep study in the past 09/20/2013   Occlusion and stenosis of carotid artery without mention of cerebral infarction 05/23/2013   PAD (peripheral artery disease) ([name]) 05/18/2013   Carotid artery disease ([name]) 04/14/2013   Bradycardia, sinus 02/18/2013   Hyperlipidemia 02/05/2013   Tobacco abuse 02/05/2013   Chest pain, unstable angina, negative MI 02/04/2013   Type 2 diabetes mellitus ([name]) 02/04/2013   CAD in native artery 02/04/2013   Benign essential hypertension 07/10/2011   Incisional hernia 05/22/2011   Home  Medication(s) Prior to Admission medications   Medication Sig Start Date End Date Taking? Authorizing Provider  apixaban (ELIQUIS) 5 MG TABS tablet Take 1 tablet (5 mg total) by mouth 2 (two) times daily. 03/21/21   Angelita Ingles, MD  atorvastatin (LIPITOR) 40 MG tablet Take 40 mg by mouth at bedtime.     [provider]  clopidogrel (PLAVIX) 75 MG tablet Take 1 tablet (75 mg total) by mouth daily.  01/04/21   Azucena Fallen, MD  dapagliflozin propanediol (FARXIGA) 10 MG TABS tablet Take 1 tablet (10 mg total) by mouth daily before breakfast. 04/25/21   Angelita Ingles, MD  escitalopram (LEXAPRO) 20 MG tablet Take 20 mg by mouth daily. 01/18/21   [provider]  furosemide (LASIX) 40 MG tablet Take 1 tablet (40 mg total) by mouth daily. 08/15/21   Clegg, Amy D, NP  glipiZIDE (GLUCOTROL) 5 MG tablet Take 5 mg by mouth daily before breakfast.    [provider]  isosorbide mononitrate (IMDUR) 60 MG 24 hr tablet Take 2 tablets (120 mg total) by mouth daily. 08/09/21   Pokhrel, Rebekah Chesterfield, MD  levothyroxine (SYNTHROID) 75 MCG tablet 1 tablet in the morning on an empty stomach    [provider]  metoprolol tartrate (LOPRESSOR) 25 MG tablet Take 0.5 tablets (12.5 mg total) by mouth 2 (two) times daily. 08/29/21   Simmons, Brittainy M, PA-C  OZEMPIC, 0.25 OR 0.5 MG/DOSE, 2 MG/1.5ML SOPN Inject 0.5 mg into the skin once a week. 07/05/21   [provider]  pantoprazole (PROTONIX) 40 MG tablet Take 40 mg by mouth daily.     [provider]  Polyethyl Glycol-Propyl Glycol (SYSTANE FREE OP) Place 1 drop into both eyes daily as needed (dry eyes).    [provider]  potassium chloride SA (KLOR-CON) 20 MEQ tablet Take 20 mEq by mouth daily.    [provider]  sacubitril-valsartan (ENTRESTO) 97-103 MG Take 1 tablet by mouth 2 (two) times daily. 08/09/21   Pokhrel, Rebekah Chesterfield, MD  spironolactone (ALDACTONE) 25 MG tablet Take 1/2 tablet (12.5 mg total) by mouth daily. 08/09/21 11/07/21  Pokhrel, Rebekah Chesterfield, MD                                                                                                                                    Allergies Fentanyl, Gabapentin, Lisinopril, Lyrica [pregabalin], Metformin, and Propofol  Review of Systems Review of Systems As noted in HPI  Physical Exam Vital Signs  I have reviewed the triage vital signs BP  (!) 189/90    Pulse 91    Temp 98.3 F (36.8 C) (Oral)    Resp (!) 23    Ht 5\' 9"  (1.753 m)    Wt 102.1 kg    SpO2 96%    BMI 33.23 kg/m   Physical Exam Vitals reviewed.  Constitutional:      General: He is not  in acute distress.    Appearance: He is well-developed. He is not diaphoretic.  HENT:     Head: Normocephalic and atraumatic.     Nose: Nose normal.  Eyes:     General: No scleral icterus.       Right eye: No discharge.        Left eye: No discharge.     Conjunctiva/sclera: Conjunctivae normal.     Pupils: Pupils are equal, round, and reactive to light.  Cardiovascular:     Rate and Rhythm: Tachycardia present. Rhythm irregularly irregular.     Heart sounds: No murmur heard.   No friction rub. No gallop.  Pulmonary:     Effort: Tachypnea and respiratory distress present.     Breath sounds: No stridor. Examination of the right-lower field reveals rales. Examination of the left-lower field reveals rales. Rales (fine) present.  Abdominal:     General: There is no distension.     Palpations: Abdomen is soft.     Tenderness: There is no abdominal tenderness.  Musculoskeletal:        General: No tenderness.     Cervical back: Normal range of motion and neck supple.     Right lower leg: 2+ Pitting Edema present.     Left lower leg: 2+ Pitting Edema present.  Skin:    General: Skin is warm and dry.     Findings: No erythema or rash.  Neurological:     Mental Status: He is alert and oriented to person, place, and time.    ED Results and Treatments Labs (all labs ordered are listed, but only abnormal results are displayed) Labs Reviewed  CBC - Abnormal; Notable for the following components:      Result Value   WBC 11.6 (*)    All other components within normal limits  BRAIN NATRIURETIC PEPTIDE - Abnormal; Notable for the following components:   B Natriuretic Peptide >4,500.0 (*)    All other components within normal limits  COMPREHENSIVE METABOLIC PANEL - Abnormal;  Notable for the following components:   Potassium 3.4 (*)    Glucose, Bld 203 (*)    Creatinine, Ser 1.71 (*)    Albumin 3.1 (*)    GFR, Estimated 40 (*)    All other components within normal limits  I-STAT CHEM 8, ED - Abnormal; Notable for the following components:   Potassium 3.2 (*)    Creatinine, Ser 1.60 (*)    Glucose, Bld 199 (*)    All other components within normal limits  RESP PANEL BY RT-PCR (FLU A&B, COVID) ARPGX2  MAGNESIUM                                                                                                                         EKG  EKG Interpretation  Date/Time:  Saturday November 23 2021 06:11:32 EST Ventricular Rate:  103 PR Interval:  137 QRS Duration: 80 QT Interval:  326 QTC Calculation: 427 R Axis:  263 Text Interpretation: Atrial fibrillation Atrial premature complexes in couplets Left anterior fascicular block Anterior infarct, old Confirmed by Drema Pry [phone]) on 11/23/2021 6:23:23 AM       Radiology DG Chest Port 1 View  Result Date: 11/23/2021 CLINICAL DATA:  Shortness of breath with atrial fibrillation with RVR. EXAM: PORTABLE CHEST 1 VIEW COMPARISON:  AP Lat 08/06/2021 FINDINGS: Moderate cardiomegaly is again noted. There is perihilar vascular congestion and mild interstitial edema of the central and lower zonal areas of both lungs, with small pleural effusions. There is patchy hazy opacity in the lower lung fields which could be atelectasis, pneumonia, ground-glass edema or combination. The mid/upper lung fields are clear of focal infiltrates with COPD. There is aortic atherosclerosis.  Stable mediastinum. Neurostimulator wiring terminates in the midthoracic spinal canal. IMPRESSION: 1. Perihilar vascular congestion with central and lower zonal interstitial edema consistent with CHF or fluid overload. 2. Small pleural effusions, with patchy hazy opacities in the lower lung fields, which could be ground-glass edema, atelectasis, pneumonia  or combination. 3. Clinical correlation and radiographic follow-up recommended. 4. Aortic atherosclerosis. Electronically Signed   By: Almira Bar M.D.   On: 11/23/2021 06:32    Pertinent labs & imaging results that were available during my care of the patient were reviewed by me and considered in my medical decision making (see MDM for details).  Medications Ordered in ED Medications  nitroGLYCERIN 50 mg in dextrose 5 % 250 mL (0.2 mg/mL) infusion (70 mcg/min Intravenous Rate/Dose Change 11/23/21 0711)  furosemide (LASIX) injection 40 mg (40 mg Intravenous Given 11/23/21 0631)                                                                                                                                     Procedures .Critical Care Performed by: Nira Conn, MD Authorized by: Nira Conn, MD   Critical care provider statement:    Critical care time (minutes):  65   Critical care time was exclusive of:  Separately billable procedures and treating other patients   Critical care was necessary to treat or prevent imminent or life-threatening deterioration of the following conditions:  Respiratory failure and cardiac failure   Critical care was time spent personally by me on the following activities:  Development of treatment plan with patient or surrogate, discussions with consultants, evaluation of patient's response to treatment, examination of patient, obtaining history from patient or surrogate, review of old charts, re-evaluation of patient's condition, pulse oximetry, ordering and review of radiographic studies, ordering and review of laboratory studies and ordering and performing treatments and interventions   Care discussed with: admitting provider    (including critical care time)  Medical Decision Making / ED Course    Complexity of Problem:  Co-morbidities/SDOH that complicate the patient evaluation/care: Noted in HPI  Additional history  obtained: EMS  Patient's presenting problem/concern and DDX listed below: Respiratory distress w/ hypertension and  in A-fib RVR Hypertensive emergency, CHF exacerbation, COPD exacerbation, pneumonia, pneumothorax.  Less suspicious for PE.      Complexity of Data:   Cardiac Monitoring: The patient was maintained on a cardiac monitor.   I personally viewed and interpreted the cardiac monitored which showed an underlying rhythm of A-fib with initial rates in the 110s.   Laboratory Tests ordered listed below with my independent interpretation: CBC with mild leukocytosis.  No anemia. Metabolic panel with mild hypokalemia.  Mild renal sufficiency.  Hyperglycemia without evidence of DKA. BNP greater than 4500 COVID/influenza negative   Imaging Studies ordered listed below with my independent interpretation: Chest x-ray consistent with CHF exacerbation.  No pneumothorax.     ED Course:    Hospitalization Considered:  Yes  Assessment, Intervention, and Reassessment: Respiratory distress Likely secondary to hypertensive emergency/CHF exacerbation Satting well on 2 L nasal cannula. Given a dose of Lasix. Placed on nitroglycerin drip. Improved work of breathing and blood pressures.  A-fib RVR. Received 10mg  Diltiazem by EMS Rates improving, now down to 90s   Spoke with Dr. who agreed to admit the patient for further work and management.  Final Clinical Impression(s) / ED Diagnoses Final diagnoses:  Hypertensive emergency  Acute on chronic diastolic (congestive) heart failure ([name])  Atrial fibrillation with RVR ([name])           This chart was dictated using voice recognition software.  Despite best efforts to proofread,  errors can occur which can change the documentation meaning.    Ophelia Charter, MD 11/23/21 0800

## 2021-11-23 NOTE — Assessment & Plan Note (Signed)
-  Body mass index is 33.23 kg/m?[name].  ?-Weight loss should be encouraged ?-Outpatient PCP/bariatric medicine f/u encouraged ?-Hold Ozempic as inpatient ?

## 2021-11-23 NOTE — Assessment & Plan Note (Signed)
-  s/p stents ?

## 2021-11-23 NOTE — Evaluation (Signed)
Physical Therapy Evaluation ?Patient Details ?Name: Albert Powell ?MRN: [id number] ?DOB: Jan 11, 1941 ?Today's Date: 11/23/2021 ? ?History of Present Illness ? Pt is a 81 y.o. M who presents 11/23/2021 with SOB. Admitted with acute on chronic diastolic CHF. CXR consistent with pulmonary edema. Significant PMH:  HTN, HLD, PAF, obesity, afib, CAD, COPD.  ?Clinical Impression ? PTA, pt lives with his spouse and is a limited household ambulator using a walker. Pt presents with decreased functional mobility secondary to decreased cardiopulmonary endurance, balance deficits, and functional weakness. Pt requiring min assist for bed mobility/transfers. Ambulating 80 feet with a Rollator at a min guard assist level. Desaturation to 86% on 2L O2 with ambulation; rebounded > 90% on 4L O2. Pt would benefit from HHPT to address deficits and maximize functional mobility. ?   ? ?Recommendations for follow up therapy are one component of a multi-disciplinary discharge planning process, led by the attending physician.  Recommendations may be updated based on patient status, additional functional criteria and insurance authorization. ? ?Follow Up Recommendations Home health PT ? ?  ?Assistance Recommended at Discharge PRN  ?Patient can return home with the following ? A little help with walking and/or transfers;A little help with bathing/dressing/bathroom ? ?  ?Equipment Recommendations None recommended by PT  ?Recommendations for Other Services ?    ?  ?Functional Status Assessment Patient has had a recent decline in their functional status and demonstrates the ability to make significant improvements in function in a reasonable and predictable amount of time.  ? ?  ?Precautions / Restrictions Precautions ?Precautions: Fall;Other (comment) ?Precaution Comments: watch O2 ?Restrictions ?Weight Bearing Restrictions: No  ? ?  ? ?Mobility ? Bed Mobility ?Overal bed mobility: Needs Assistance ?Bed Mobility: Supine to Sit, Sit to Supine ?  ?   ?Supine to sit: Min assist ?Sit to supine: Min guard ?  ?General bed mobility comments: MinA for trunk assist to upright. Pt able to return to bed without physical assist. Increased time/effort ?  ? ?Transfers ?Overall transfer level: Needs assistance ?Equipment used: Rollator (4 wheels) ?Transfers: Sit to/from Stand ?Sit to Stand: Min assist ?  ?  ?  ?  ?  ?General transfer comment: light minA to power up ?  ? ?Ambulation/Gait ?Ambulation/Gait assistance: Min guard ?Gait Distance (Feet): 80 Feet ?Assistive device: Rollator (4 wheels) ?Gait Pattern/deviations: Step-through pattern, Decreased stride length ?Gait velocity: decreased ?Gait velocity interpretation: <1.8 ft/sec, indicate of risk for recurrent falls ?  ?General Gait Details: Bilateral knee instability with fatigue, cues for walker proximity ? ?Stairs ?  ?  ?  ?  ?  ? ?Wheelchair Mobility ?  ? ?Modified Rankin (Stroke Patients Only) ?  ? ?  ? ?Balance Overall balance assessment: Needs assistance ?Sitting-balance support: No upper extremity supported, Feet supported ?Sitting balance-Leahy Scale: Fair ?  ?  ?Standing balance support: Bilateral upper extremity supported, During functional activity ?Standing balance-Leahy Scale: Poor ?  ?  ?  ?  ?  ?  ?  ?  ?  ?  ?  ?  ?   ? ? ? ?Pertinent Vitals/Pain Pain Assessment ?Pain Assessment: No/denies pain  ? ? ?Home Living Family/patient expects to be discharged to:: Private residence ?Living Arrangements: Spouse/significant other ?Available Help at Discharge: Family;Available 24 hours/day ?Type of Home: Apartment ?Home Access: Level entry ?  ?  ?  ?Home Layout: One level ?Home Equipment: IT sales professional (4 wheels);Shower seat;Cane - single point ?   ?  ?Prior Function Prior Level of Function :  Needs assist ?  ?  ?  ?  ?  ?  ?Mobility Comments: Uses rollator for mobility vs no AD ?ADLs Comments: modified independent for self care, sits and stands for showering, reports showering is fatiguing, wife does  cooking and cleaning ?  ? ? ?Hand Dominance  ? Dominant Hand: Right ? ?  ?Extremity/Trunk Assessment  ? Upper Extremity Assessment ?Upper Extremity Assessment: Defer to OT evaluation ?  ? ?Lower Extremity Assessment ?Lower Extremity Assessment: RLE deficits/detail;LLE deficits/detail ?RLE Deficits / Details: Strength 5/5 ?LLE Deficits / Details: Strength 5/5 ?  ? ?Cervical / Trunk Assessment ?Cervical / Trunk Assessment: Other exceptions ?Cervical / Trunk Exceptions: forward head posture  ?Communication  ? Communication: No difficulties  ?Cognition Arousal/Alertness: Awake/alert ?Behavior During Therapy: [facility] for tasks assessed/performed ?Overall Cognitive Status: Within Functional Limits for tasks assessed ?  ?  ?  ?  ?  ?  ?  ?  ?  ?  ?  ?  ?  ?  ?  ?  ?  ?  ?  ? ?  ?General Comments   ? ?  ?Exercises    ? ?Assessment/Plan  ?  ?PT Assessment Patient needs continued PT services  ?PT Problem List Decreased strength;Decreased activity tolerance;Decreased balance;Decreased mobility ? ?   ?  ?PT Treatment Interventions DME instruction;Gait training;Functional mobility training;Therapeutic activities;Therapeutic exercise;Balance training;Patient/family education   ? ?PT Goals (Current goals can be found in the Care Plan section)  ?Acute Rehab PT Goals ?Patient Stated Goal: improve walking ?PT Goal Formulation: With patient ?Time For Goal Achievement: 12/07/21 ?Potential to Achieve Goals: Good ? ?  ?Frequency Min 3X/week ?  ? ? ?Co-evaluation   ?  ?  ?  ?  ? ? ?  ?AM-PAC PT "6 Clicks" Mobility  ?Outcome Measure Help needed turning from your back to your side while in a flat bed without using bedrails?: None ?Help needed moving from lying on your back to sitting on the side of a flat bed without using bedrails?: A Little ?Help needed moving to and from a bed to a chair (including a wheelchair)?: A Little ?Help needed standing up from a chair using your arms (e.g., wheelchair or bedside chair)?: A Little ?Help needed to  walk in hospital room?: A Little ?Help needed climbing 3-5 steps with a railing? : A Lot ?6 Click Score: 18 ? ?  ?End of Session Equipment Utilized During Treatment: Oxygen ?Activity Tolerance: Patient tolerated treatment well ?Patient left: in bed;with call bell/phone within reach;with family/visitor present (pt deferring chair) ?Nurse Communication: Mobility status ?PT Visit Diagnosis: Unsteadiness on feet (R26.81);Difficulty in walking, not elsewhere classified (R26.2) ?  ? ?Time: [id number] ?PT Time Calculation (min) (ACUTE ONLY): 27 min ? ? ?Charges:   PT Evaluation ?$PT Eval Moderate Complexity: 1 Mod ?PT Treatments ?$Therapeutic Activity: 8-22 mins ?  ?   ? ? ?Wyona Almas, PT, DPT ?Acute Rehabilitation Services ?Pager [phone] ?Office [phone] ? ? ?Carloine Margo Aye ?11/23/2021, 4:28 PM ? ?

## 2021-11-23 NOTE — ED Notes (Signed)
SWOT RN transporting pt to 6E on cardiac monitor.  ?

## 2021-11-23 NOTE — ED Notes (Signed)
In to meet pt this AM. Pt reports he feels much more comfortable, no respiratory distress. Pt repositioned in bed and call bell in reach.  ?

## 2021-11-23 NOTE — ED Provider Triage Note (Signed)
Emergency Medicine Provider Triage Evaluation Note ? ?Albert Powell , a 81 y.o. male  was evaluated after arrival.  He has a history of paroxysmal A-fib on Eliquis and Plavix, congestive heart failure on Lasix.  He follows with Dr. Gwenlyn Found of cardiology.  Pt complains of SOB that worsened last night.  He was trying to get through the night and come to the emergency department this morning, but symptoms worsen.  He was very short of breath and generally weak.  He reports increased shortness of breath with lying flat.  No chest pain.  EMS was called and found patient to be in atrial fibrillation with rapid ventricular response.  10 mg of Cardizem was given with improvement in symptoms.  Heart rate went from around 150 down to 90-110.  Reports he has been compliant with his medications. ? ?Review of Systems  ?Positive: Shortness of breath ?Negative: Fever, cough ? ?Physical Exam  ?BP (!) 175/133 (BP Location: Right Arm)   Pulse (!) 101   Temp 98.3 ?F (36.8 ?C) (Oral)   Resp (!) 24   Ht 5\' 9"  (1.753 m)   Wt 102.1 kg   SpO2 96%   BMI 33.23 kg/m?  ?Gen:   Awake, no distress  ?Resp:  Mild tachypnea ?MSK:   Moves extremities without difficulty  ?Other:  Tachycardia, regular rhythm ? ?Medical Decision Making  ?Medically screening exam initiated at 6:13 AM.  Appropriate orders placed.  Albert Powell was informed that the remainder of the evaluation will be completed by another provider, this initial triage assessment does not replace that evaluation, and the importance of remaining in the ED until their evaluation is complete. ? ? ?  ?Carlisle Cater, PA-C ?11/23/21 [id number] ? ?

## 2021-11-23 NOTE — TOC Initial Note (Addendum)
Transition of Care (TOC) - Initial/Assessment Note  ? ? ?Patient Details  ?Name: Albert Powell ?MRN: [id number] ?Date of Birth: 1941-07-12 ? ?Transition of Care (TOC) CM/SW Contact:    ?Verdell Carmine, RN ?Phone Number: ?11/23/2021, 11:24 AM ? ?Clinical Narrative:                 ?81 year old patient presented with atrial fibrillation, SHOB and weakness.  History of Atrial fibrillation on eliquis CAD, CHF  pn high flow oxygen currently.  ?PT and OT consults for recommendations.  ?CM will follow for needs, recommendations, and transitions. ?Previously had home health ( just recently) with [facility]. They would be happy to service him again if needed. ?Barriers to Discharge: Continued Medical Work up ? ? ?Patient Goals and CMS Choice ?  ?  ?  ? ?Expected Discharge Plan and Services ?  ?  ?Discharge Planning Services: CM Consult ?  ?Living arrangements for the past 2 months: Apartment ?                ?  ?  ?  ?  ?  ?  ?  ?  ?  ?  ? ?Prior Living Arrangements/Services ?Living arrangements for the past 2 months: Apartment ?Lives with:: Spouse ?Patient language and need for interpreter reviewed:: Yes ?       ?Need for Family Participation in Patient Care: Yes (Comment) ?Care giver support system in place?: Yes (comment) ?  ?Criminal Activity/Legal Involvement Pertinent to Current Situation/Hospitalization: No - Comment as needed ? ?Activities of Daily Living ?  ?  ? ?Permission Sought/Granted ?  ?  ?   ?   ?   ?   ? ?Emotional Assessment ?  ?  ?  ?Orientation: : Oriented to Self, Oriented to Place, Oriented to  Time, Oriented to Situation ?Alcohol / Substance Use: Not Applicable ?Psych Involvement: No (comment) ? ?Admission diagnosis:  Acute on chronic diastolic CHF (congestive heart failure) ([place]) [I50.33] ?Patient Active Problem List  ? Diagnosis Date Noted  ? Acute on chronic diastolic CHF (congestive heart failure) ([place]) 11/23/2021  ? PAF (paroxysmal atrial fibrillation) ([place]) 10/08/2021  ? Hypertensive urgency   ?  Acute on chronic diastolic heart failure ([place]) 06/07/2021  ? Acute metabolic encephalopathy   ? Acute exacerbation of CHF (congestive heart failure) ([place]) 06/06/2021  ? CHF (congestive heart failure) ([place]) 03/10/2021  ? Acute hypoxemic respiratory failure ([place]) 03/10/2021  ? Uncontrolled hypertension 03/10/2021  ? Hypertensive emergency 01/01/2021  ? ACS (acute coronary syndrome) ([place])   ? AKI (acute kidney injury) ([place])   ? Atrial fibrillation with RVR ([place]) 03/30/2020  ? Contusion of right hand 11/12/2018  ? Primary osteoarthritis of both first carpometacarpal joints 11/12/2018  ? Pain in joint of right shoulder 10/05/2018  ? Pain in right hand 10/05/2018  ? Hematoma 03/30/2018  ? Degeneration of lumbar intervertebral disc 12/21/2017  ? Lumbar radiculopathy 12/21/2017  ? Vertigo 04/09/2017  ? Hypogonadism in male 12/07/2014  ? Screening for prostate cancer 12/07/2014  ? Claudication of right lower extremity ([place]) 10/20/2013  ? Claudication in peripheral vascular disease- Rt leg 09/20/2013  ? Obesity (BMI 30-39.9)- negative sleep study in the past 09/20/2013  ? Occlusion and stenosis of carotid artery without mention of cerebral infarction 05/23/2013  ? PAD (peripheral artery disease) ([place]) 05/18/2013  ? Carotid artery disease ([place]) 04/14/2013  ? Bradycardia, sinus 02/18/2013  ? Hyperlipidemia 02/05/2013  ? Tobacco abuse 02/05/2013  ? Chest pain, unstable angina, negative  MI 02/04/2013  ? Type 2 diabetes mellitus ([place]) 02/04/2013  ? CAD in native artery 02/04/2013  ? Benign essential hypertension 07/10/2011  ? Incisional hernia 05/22/2011  ? ?PCP:  Leeroy Cha, MD ?Pharmacy:   ?[facility] ?[address] ?[place] [address] ?Phone: [phone] Fax: [phone] ? ?[facility] - [place], [place] - [address] ?[address] ?[place] [address] ?Phone: [phone] Fax: [phone] ? ? ? ? ?Social Determinants of Health  (SDOH) Interventions ?  ? ?Readmission Risk Interventions ?No flowsheet data found. ? ? ?

## 2021-11-23 NOTE — ED Notes (Signed)
Pt sitting at bedside eating lunch. Call light within reach. Denies any further needs at this time.  ?

## 2021-11-23 NOTE — Assessment & Plan Note (Signed)
-  Encourage cessation - he reports that he is not smoking and is only vaping - we discussed that this may be even worse. ?-This was discussed with the patient and should be reviewed on an ongoing basis.   ?-Patch ordered  ?

## 2021-11-23 NOTE — H&P (Signed)
History and Physical    Patient: Albert Powell [id number] DOB: 12-30-1940 DOA: 11/23/2021 DOS: the patient was seen and examined on 11/23/2021 PCP: Leeroy Cha, MD  Patient coming from: Home - lives with wife; NOK: Wife, Karem Chadick, [phone]   Chief Complaint: SOB  HPI: Albert Powell is a 81 y.o. male with medical history significant of afib; CAD; chronic diastolic CHF; COPD; DM; HTN; and HLD presenting with SOB.  He reports that he has had issues before with CHF and it happened again.  He was breathing real heavy and was nauseated.  Symptoms started yesterday afternoon.  No chest pain.  +SOB.  Mild cough, productive of a small amount of clear fluid.  He doesn't remember if it felt like his heart was racing.  +orthopnea. He is not on home O2.  He is feeling better at this time.    ER Course:  HTN emergency/CHF/afib in RVR on arrival.  On 2L [place] O2 and NTG drip.  Given Lasix.  Improved HR, not on [place].     Review of Systems: As mentioned in the history of present illness. All other systems reviewed and are negative. Past Medical History:  Diagnosis Date   Atrial fibrillation ([place]) 03/30/2020   Bradycardia, sinus 02/18/2013   CAD (coronary artery disease)    stent to RCA 2003 also had 40% lesion at that time; NUCLEAR STRESS TEST, 01/16/2010 - normal  now with cath 80-90% stenosis in LAD, will try medical therapy if no improvement PCI   Carotid artery disease ([name])    Cataract    CHF (congestive heart failure) ([place])    Claudication ([place])    LEA DUPLEX, 07/07/2008 - Normal   COPD (chronic obstructive pulmonary disease) ([place])    DDD (degenerative disc disease) 2008   Diabetes mellitus    GERD (gastroesophageal reflux disease)    H/O hiatal hernia    History of colonoscopy 2004   finding of tics and AMV only no polpys   Hyperlipidemia 02/05/2013   Hypertension    Onychomycosis    Rosacea    Tobacco abuse 02/05/2013   Past Surgical History:  Procedure  Laterality Date   ABDOMINAL AORTOGRAM W/LOWER EXTREMITY N/A 03/30/2018   Procedure: ABDOMINAL AORTOGRAM W/LOWER EXTREMITY;  Surgeon: Serafina Mitchell, MD;  Location: [facility];  Service: Cardiovascular;  Laterality: N/A;   ABDOMINAL AORTOGRAM W/LOWER EXTREMITY Bilateral 08/27/2020   Procedure: ABDOMINAL AORTOGRAM W/LOWER EXTREMITY;  Surgeon: Lorretta Harp, MD;  Location: [facility];  Service: Cardiovascular;  Laterality: Bilateral;   APPENDECTOMY     CARDIAC CATHETERIZATION  08/29/2002   2-vessel CAD with high-grade stenoses in RCA; stenting to RCA   CARDIAC CATHETERIZATION  2014   80-90% lesion will try medical therapy   CARDIAC SURGERY     stents  2003   CARDIOVERSION N/A 11/01/2013   Procedure: [place] COMPRESSION;  Surgeon: Lorretta Harp, MD;  Location: [facility];  Service: Cardiovascular;  Laterality: N/A;   CAROTID ANGIOGRAM N/A 04/25/2013   Procedure: CAROTID ANGIOGRAM;  Surgeon: Lorretta Harp, MD;  Location: [facility] CATH LAB;  Service: Cardiovascular;  Laterality: N/A;   CAROTID ENDARTERECTOMY     COLON RESECTION  3/04, 6/04    1 bleeding diverticulitis, 2 complete colectomy   COLON SURGERY  2005   renal pouch rectal anastimosis   CORONARY ANGIOPLASTY WITH STENT PLACEMENT  09/2002   2 stents to RCA   ENDARTERECTOMY Left 06/02/2013   Procedure: ENDARTERECTOMY CAROTID-LEFT;  Surgeon: Serafina Mitchell, MD;  Location: [facility];  Service: Vascular;  Laterality: Left;   INTRAVASCULAR PRESSURE WIRE/FFR STUDY N/A 04/02/2020   Procedure: INTRAVASCULAR PRESSURE WIRE/FFR STUDY;  Surgeon: Lorretta Harp, MD;  Location: [facility];  Service: Cardiovascular;  Laterality: N/A;  DFR - RCA   LEFT HEART CATH AND CORONARY ANGIOGRAPHY N/A 04/02/2020   Procedure: LEFT HEART CATH AND CORONARY ANGIOGRAPHY;  Surgeon: Lorretta Harp, MD;  Location: [facility];  Service: Cardiovascular;  Laterality: N/A;   LEFT HEART CATHETERIZATION WITH CORONARY ANGIOGRAM N/A  02/08/2013   Procedure: LEFT HEART CATHETERIZATION WITH CORONARY ANGIOGRAM;  Surgeon: Lorretta Harp, MD;  Location: [facility] CATH LAB;  Service: Cardiovascular;  Laterality: N/A;   LOWER EXTREMITY ANGIOGRAM N/A 10/20/2013   Procedure: LOWER EXTREMITY ANGIOGRAM;  Surgeon: Lorretta Harp, MD;  Location: [facility] CATH LAB;  Service: Cardiovascular;  Laterality: N/A;   PATCH ANGIOPLASTY Left 06/02/2013   Procedure: PATCH ANGIOPLASTY;  Surgeon: Serafina Mitchell, MD;  Location: [facility] OR;  Service: Vascular;  Laterality: Left;   PERIPHERAL VASCULAR INTERVENTION Bilateral 03/30/2018   Procedure: PERIPHERAL VASCULAR INTERVENTION;  Surgeon: Serafina Mitchell, MD;  Location: [facility];  Service: Cardiovascular;  Laterality: Bilateral;  common iliacs   TONSILLECTOMY     Social History:  reports that he has been smoking cigarettes and e-cigarettes. He has a 85.50 pack-year smoking history. He has never used smokeless tobacco. He reports current alcohol use of about 1.0 standard drink per week. He reports that he does not use drugs.  Allergies  Allergen Reactions   Fentanyl Other (See Comments)    Behavioral changes   Gabapentin Other (See Comments)    ankle swells   Lisinopril Other (See Comments)    unknown   Lyrica [Pregabalin] Other (See Comments)    unknown   Metformin Diarrhea   Propofol Other (See Comments)    Heart rate dropped    Family History  Problem Relation Age of Onset   Heart disease Father        heart attack at 55   Heart attack Father    Hyperlipidemia Father    Colonic polyp Mother        that bled out   COPD Mother    Diabetes Mother    Heart disease Sister    Colonic polyp Sister    Stroke Maternal Grandfather    Heart attack Paternal Grandfather    Other Neg Hx        hypogonadism    Prior to Admission medications   Medication Sig Start Date End Date Taking? Authorizing Provider  apixaban (ELIQUIS) 5 MG TABS tablet Take 1 tablet (5 mg total) by mouth 2 (two) times  daily. 03/21/21   Katherine Roan, MD  atorvastatin (LIPITOR) 40 MG tablet Take 40 mg by mouth at bedtime.     [provider]  clopidogrel (PLAVIX) 75 MG tablet Take 1 tablet (75 mg total) by mouth daily. 01/04/21   Little Ishikawa, MD  dapagliflozin propanediol (FARXIGA) 10 MG TABS tablet Take 1 tablet (10 mg total) by mouth daily before breakfast. 04/25/21   Katherine Roan, MD  escitalopram (LEXAPRO) 20 MG tablet Take 20 mg by mouth daily. 01/18/21   [provider]  furosemide (LASIX) 40 MG tablet Take 1 tablet (40 mg total) by mouth daily. 08/15/21   Clegg, Amy D, NP  glipiZIDE (GLUCOTROL) 5 MG tablet Take 5 mg by mouth daily before  breakfast.    [provider]  isosorbide mononitrate (IMDUR) 60 MG 24 hr tablet Take 2 tablets (120 mg total) by mouth daily. 08/09/21   Pokhrel, Corrie Mckusick, MD  levothyroxine (SYNTHROID) 75 MCG tablet 1 tablet in the morning on an empty stomach    [provider]  metoprolol tartrate (LOPRESSOR) 25 MG tablet Take 0.5 tablets (12.5 mg total) by mouth 2 (two) times daily. 08/29/21   Simmons, Brittainy M, PA-C  OZEMPIC, 0.25 OR 0.5 MG/DOSE, 2 MG/1.5ML SOPN Inject 0.5 mg into the skin once a week. 07/05/21   [provider]  pantoprazole (PROTONIX) 40 MG tablet Take 40 mg by mouth daily.     [provider]  Polyethyl Glycol-Propyl Glycol (SYSTANE FREE OP) Place 1 drop into both eyes daily as needed (dry eyes).    [provider]  potassium chloride SA (KLOR-CON) 20 MEQ tablet Take 20 mEq by mouth daily.    [provider]  sacubitril-valsartan (ENTRESTO) 97-103 MG Take 1 tablet by mouth 2 (two) times daily. 08/09/21   Pokhrel, Corrie Mckusick, MD  spironolactone (ALDACTONE) 25 MG tablet Take 1/2 tablet (12.5 mg total) by mouth daily. 08/09/21 11/07/21  Flora Lipps, MD    Physical Exam: Vitals:   11/23/21 1132 11/23/21 1230 11/23/21 1345 11/23/21 1405  BP: (!) 168/96 (!) 158/95 117/61   Pulse: 96  90 75 89  Resp: 19     Temp:    98.6 F (37 C)  TempSrc:    Oral  SpO2: 96% 96% 98%   Weight:      Height:       General:  Appears calm and comfortable and is in NAD Eyes:  PERRL, EOMI, normal lids, iris ENT:  grossly normal hearing, lips & tongue, mmm Neck:  no LAD, masses or thyromegaly Cardiovascular:  RRR, no m/r/g. Tr LE edema.  Respiratory:   CTA bilaterally with no wheezes/rales/rhonchi.  Mildly increased respiratory effort. Abdomen:  soft, NT, ND Skin:  no rash or induration seen on limited exam Musculoskeletal:  grossly normal tone BUE/BLE, good ROM, no bony abnormality Psychiatric:  grossly normal mood and affect, speech fluent and appropriate, AOx3 Neurologic:  CN 2-12 grossly intact, moves all extremities in coordinated fashion   Radiological Exams on Admission: Independently reviewed - see discussion in A/P where applicable  DG Chest Port 1 View  Result Date: 11/23/2021 CLINICAL DATA:  Shortness of breath with atrial fibrillation with RVR. EXAM: PORTABLE CHEST 1 VIEW COMPARISON:  AP Lat 08/06/2021 FINDINGS: Moderate cardiomegaly is again noted. There is perihilar vascular congestion and mild interstitial edema of the central and lower zonal areas of both lungs, with small pleural effusions. There is patchy hazy opacity in the lower lung fields which could be atelectasis, pneumonia, ground-glass edema or combination. The mid/upper lung fields are clear of focal infiltrates with COPD. There is aortic atherosclerosis.  Stable mediastinum. Neurostimulator wiring terminates in the midthoracic spinal canal. IMPRESSION: 1. Perihilar vascular congestion with central and lower zonal interstitial edema consistent with CHF or fluid overload. 2. Small pleural effusions, with patchy hazy opacities in the lower lung fields, which could be ground-glass edema, atelectasis, pneumonia or combination. 3. Clinical correlation and radiographic follow-up recommended. 4. Aortic atherosclerosis.  Electronically Signed   By: Telford Nab M.D.   On: 11/23/2021 06:32    EKG: Independently reviewed.  Afib with rate 103; nonspecific ST changes with no evidence of acute ischemia   Labs on Admission: I have personally reviewed the available labs  and imaging studies at the time of the admission.  Pertinent labs:    K+ 3.4 Glucose 203 BUN 18/Creatinine 1.71/GFR 40 - stable BNP >4500 WBC 11.6 COVID/flu negative    Assessment and Plan: * Acute on chronic diastolic CHF (congestive heart failure) ([name]) -Patient with known h/o chronic diastolic CHF presenting with worsening SOB and hypoxia (documented to 93%) -CXR consistent with pulmonary edema -Markedly elevated BNP -With elevated BNP and abnl CXR, acute decompensated CHF seems probable as diagnosis -Will admit, as per the Emergency HF Mortality Risk Grade.  The patient has: severe pulmonary edema requiring new O2 therapy -He was started on a NTG drip and so requires monitoring in progressive care unit -Will request echocardiogram -CHF order set utilized; may need CHF team consult but will hold until Echo results are available -Was given Lasix 40 mg IV BID -Continue [place] O2 for now -Stable kidney function at this time, will follow -[employer] as inpatient  HTN -Continue home spironolactone, Entresto, Lopressor, Cozaar -Hold Farxiga -Will also add prn hydralazine  HLD -Continue Lipitor  PAF (paroxysmal atrial fibrillation) ([name]) -Rate controlled with Lopressor -He reports taking Eliquis, his wife denies; regardless, he does appear to need it and so this was continued/resumed  Obesity (BMI 30-39.9)- negative sleep study in the past -Body mass index is 33.23 kg/m..  -Weight loss should be encouraged -Outpatient PCP/bariatric medicine f/u encouraged -Hold Ozempic as inpatient  PAD (peripheral artery disease) ([place]) -s/p stents  Tobacco abuse -Encourage cessation - he reports that he is not smoking and is only vaping -  we discussed that this may be even worse. -This was discussed with the patient and should be reviewed on an ongoing basis.   -Patch ordered   Type 2 diabetes mellitus ([place]) -Recent A1c was 7.4, suboptimal control -hold glipizide, Farxiga -Cover with [facility] SSI     Advance Care Planning:   Code Status: Full Code   Consults: Heart failure navigator; TOC team; nutrition; PT/OT  DVT Prophylaxis: Eliquis  Family Communication: None present; he declined to have me call his wife at the time of admission  Severity of Illness: The appropriate patient status for this patient is INPATIENT. Inpatient status is judged to be reasonable and necessary in order to provide the required intensity of service to ensure the patient's safety. The patient's presenting symptoms, physical exam findings, and initial radiographic and laboratory data in the context of their chronic comorbidities is felt to place them at high risk for further clinical deterioration. Furthermore, it is not anticipated that the patient will be medically stable for discharge from the hospital within 2 midnights of admission.   * I certify that at the point of admission it is my clinical judgment that the patient will require inpatient hospital care spanning beyond 2 midnights from the point of admission due to high intensity of service, high risk for further deterioration and high frequency of surveillance required.*  Author: Karmen Bongo, MD 11/23/2021 3:51 PM  For on call review [email or web address].

## 2021-11-23 NOTE — Assessment & Plan Note (Addendum)
-  Recent A1c was 7.4, suboptimal control ?-hold glipizide, Farxiga ?-Cover with moderate-scale SSI ?

## 2021-11-23 NOTE — ED Notes (Signed)
Pt assisted back to bed, remains in afib rate 1teens 120s. BP stable. Will reassess HR once back in bed for 20-30 minutes.  ?

## 2021-11-23 NOTE — Assessment & Plan Note (Signed)
-  Rate controlled with Lopressor ?-He reports taking Eliquis, his wife denies; regardless, he does appear to need it and so this was continued/resumed ?

## 2021-11-23 NOTE — ED Triage Notes (Signed)
Pt BIB EMS from home for SOB that started before bed and continued through the night. Pt in Afib RVR on EMS arrival between 130-180. Pt given 10mg  Cardizem and went to afib 80-120 with resolution of SOB and weakness.  ? ?4 Zofran PTA  ?Hx afib  ? ?202/120    196 SBP after cards.  ?100% Simple mask @ 8L  ?

## 2021-11-23 NOTE — Assessment & Plan Note (Addendum)
-  Patient with known h/o chronic diastolic CHF presenting with worsening SOB and hypoxia (documented to 93%) ?-CXR consistent with pulmonary edema ?-Markedly elevated BNP ?-With elevated BNP and abnl CXR, acute decompensated CHF seems probable as diagnosis ?-Will admit, as per the Emergency HF Mortality Risk Grade.  The patient has: severe pulmonary edema requiring new O2 therapy ?-He was started on a NTG drip and so requires monitoring in progressive care unit ?-Will request echocardiogram ?-CHF order set utilized; may need CHF team consult but will hold until Echo results are available ?-Was given Lasix 40 mg IV BID ?-Continue [place] O2 for now ?-Stable kidney function at this time, will follow ?-Hold Farxiga as inpatient ? ?HTN ?-Continue home spironolactone, Entresto, Lopressor, Cozaar ?-Hold [place] ?-Will also add prn hydralazine ? ?HLD ?-Continue Lipitor ?

## 2021-11-23 NOTE — ED Notes (Signed)
Pt moved to hospital bed for comfort.

## 2021-11-23 NOTE — ED Notes (Signed)
Pt up to side of bed to eat breakfast, became tachycardic, afib vs ST. Pt asymptomatic ?

## 2021-11-24 ENCOUNTER — Other Ambulatory Visit (HOSPITAL_COMMUNITY): Payer: Medicare Other

## 2021-11-24 ENCOUNTER — Other Ambulatory Visit: Payer: Self-pay

## 2021-11-24 ENCOUNTER — Inpatient Hospital Stay (HOSPITAL_COMMUNITY): Payer: Medicare Other

## 2021-11-24 DIAGNOSIS — I5033 Acute on chronic diastolic (congestive) heart failure: Secondary | ICD-10-CM | POA: Diagnosis not present

## 2021-11-24 LAB — CBC
HCT: 40.5 % (ref 39.0–52.0)
Hemoglobin: 13.7 g/dL (ref 13.0–17.0)
MCH: 30.2 pg (ref 26.0–34.0)
MCHC: 33.8 g/dL (ref 30.0–36.0)
MCV: 89.4 fL (ref 80.0–100.0)
Platelets: 243 10*3/uL (ref 150–400)
RBC: 4.53 MIL/uL (ref 4.22–5.81)
RDW: 13.8 % (ref 11.5–15.5)
WBC: 10 10*3/uL (ref 4.0–10.5)
nRBC: 0 % (ref 0.0–0.2)

## 2021-11-24 LAB — GLUCOSE, CAPILLARY
Glucose-Capillary: 145 mg/dL — ABNORMAL HIGH (ref 70–99)
Glucose-Capillary: 146 mg/dL — ABNORMAL HIGH (ref 70–99)
Glucose-Capillary: 228 mg/dL — ABNORMAL HIGH (ref 70–99)
Glucose-Capillary: 118 mg/dL — ABNORMAL HIGH (ref 70–99)

## 2021-11-24 LAB — BASIC METABOLIC PANEL
Anion gap: 11 (ref 5–15)
BUN: 23 mg/dL (ref 8–23)
CO2: 26 mmol/L (ref 22–32)
Calcium: 8.4 mg/dL — ABNORMAL LOW (ref 8.9–10.3)
Chloride: 98 mmol/L (ref 98–111)
Creatinine, Ser: 1.95 mg/dL — ABNORMAL HIGH (ref 0.61–1.24)
GFR, Estimated: 34 mL/min — ABNORMAL LOW (ref >60)
Glucose, Bld: 151 mg/dL — ABNORMAL HIGH (ref 70–99)
Potassium: 3.4 mmol/L — ABNORMAL LOW (ref 3.5–5.1)
Sodium: 135 mmol/L (ref 135–145)

## 2021-11-24 MED ORDER — METOPROLOL TARTRATE 25 MG PO TABS
25.0000 mg | ORAL_TABLET | Freq: Two times a day (BID) | ORAL | Status: DC
Start: 2021-11-24 — End: 2021-11-24
  Filled 2021-11-24: qty 1

## 2021-11-24 MED ORDER — DILTIAZEM HCL 60 MG PO TABS: 30.0000 mg | ORAL_TABLET | Freq: Four times a day (QID) | ORAL | Status: DC

## 2021-11-24 MED ORDER — ADULT MULTIVITAMIN W/MINERALS CH
1.0000 | ORAL_TABLET | Freq: Every day | ORAL | Status: DC
  Administered 2021-11-24 – 2021-11-27 (×4): 1 via ORAL
  Filled 2021-11-24 (×4): qty 1

## 2021-11-24 MED ORDER — ENSURE MAX PROTEIN PO LIQD
11.0000 [oz_av] | Freq: Two times a day (BID) | ORAL | Status: DC
  Administered 2021-11-24 – 2021-11-26 (×3): 11 [oz_av] via ORAL
  Filled 2021-11-24 (×6): qty 330

## 2021-11-24 MED ORDER — CARVEDILOL 6.25 MG PO TABS
6.2500 mg | ORAL_TABLET | Freq: Two times a day (BID) | ORAL | Status: DC
  Administered 2021-11-24 – 2021-11-27 (×6): 6.25 mg via ORAL
  Filled 2021-11-24 (×6): qty 1

## 2021-11-24 NOTE — Evaluation (Addendum)
Occupational Therapy Evaluation ?Patient Details ?Name: Albert Powell ?MRN: [id number] ?DOB: [birth year] ?Today's Date: 11/24/2021 ? ? ?History of Present Illness Pt is a 81 y.o. M who presents 11/23/2021 with SOB. Admitted with acute on chronic diastolic CHF. CXR consistent with pulmonary edema. Significant PMH:  HTN, HLD, PAF, obesity, afib, CAD, COPD.  ? ?Clinical Impression ?  ?Pt reports using rollator at baseline for mobility, has standby assist from wife at home with ADLs and transfers. Pt currently needing min-mod A for ADLs, min guard for bed mobility, and min A for transfers with RW. Pt SpO2 dropping into low-mid 80's during session with poor wave form, however recovered to mid 90's when in supine, new O2 sensor applied. Pt states he feels his mobility and activity tolerance is close to baseline. Pt presenting with impairments listed below, will follow. Recommend HHOT at d/c.  ?   ? ?Recommendations for follow up therapy are one component of a multi-disciplinary discharge planning process, led by the attending physician.  Recommendations may be updated based on patient status, additional functional criteria and insurance authorization.  ? ?Follow Up Recommendations ? Home health OT  ?  ?Assistance Recommended at Discharge Set up Supervision/Assistance  ?Patient can return home with the following A little help with walking and/or transfers;A little help with bathing/dressing/bathroom;Assistance with cooking/housework ? ?  ?Functional Status Assessment ? Patient has had a recent decline in their functional status and demonstrates the ability to make significant improvements in function in a reasonable and predictable amount of time.  ?Equipment Recommendations ? BSC/3in1  ?  ?Recommendations for Other Services   ? ? ?  ?Precautions / Restrictions Precautions ?Precautions: Fall;Other (comment) ?Precaution Comments: watch O2 ?Restrictions ?Weight Bearing Restrictions: No  ? ?  ? ?Mobility Bed Mobility ?Overal  bed mobility: Needs Assistance ?Bed Mobility: Sit to Supine ?  ?  ?  ?Sit to supine: Min guard ?  ?General bed mobility comments: increased time to bring BLE's to bed ?  ? ?Transfers ?Overall transfer level: Needs assistance ?Equipment used: Rolling walker (2 wheels) ?Transfers: Sit to/from Stand ?Sit to Stand: Min assist ?  ?  ?  ?  ?  ?General transfer comment: min A from lower surfaces ?  ? ?  ?Balance Overall balance assessment: Needs assistance ?Sitting-balance support: No upper extremity supported, Feet supported ?Sitting balance-Leahy Scale: Good ?Sitting balance - Comments: able to reach down to pull up sock from sitting position, able to simulate figure 4 ?  ?Standing balance support: Bilateral upper extremity supported, During functional activity ?Standing balance-Leahy Scale: Poor ?Standing balance comment: reliant on RW ?  ?  ?  ?  ?  ?  ?  ?  ?  ?  ?  ?   ? ?ADL either performed or assessed with clinical judgement  ? ?ADL Overall ADL's : Needs assistance/impaired ?Eating/Feeding: Set up;Sitting ?  ?Grooming: Set up;Sitting ?  ?Upper Body Bathing: Minimal assistance;Sitting ?  ?Lower Body Bathing: Moderate assistance;Sit to/from stand ?  ?Upper Body Dressing : Minimal assistance;Sitting ?  ?Lower Body Dressing: Moderate assistance;Sitting/lateral leans ?  ?Toilet Transfer: Min guard;Rolling walker (2 wheels);Regular Toilet;Ambulation ?  ?Toileting- Clothing Manipulation and Hygiene: Supervision/safety;Sitting/lateral lean ?  ?  ?  ?Functional mobility during ADLs: Min guard;Rolling walker (2 wheels) ?   ? ? ? ?Vision   ?Vision Assessment?: No apparent visual deficits  ?   ?Perception   ?  ?Praxis   ?  ? ?Pertinent Vitals/Pain Pain Assessment ?Pain Assessment: No/denies pain  ? ? ? ?  Hand Dominance   ?  ?Extremity/Trunk Assessment Upper Extremity Assessment ?Upper Extremity Assessment: Overall WFL for tasks assessed ?  ?Lower Extremity Assessment ?Lower Extremity Assessment: Defer to PT evaluation ?   ?Cervical / Trunk Assessment ?Cervical / Trunk Assessment: Other exceptions ?Cervical / Trunk Exceptions: forward head posture ?  ?Communication Communication ?Communication: No difficulties ?  ?Cognition Arousal/Alertness: Awake/alert ?Behavior During Therapy: [facility] for tasks assessed/performed ?Overall Cognitive Status: Within Functional Limits for tasks assessed ?  ?  ?  ?  ?  ?  ?  ?  ?  ?  ?  ?  ?  ?  ?  ?  ?  ?  ?  ?General Comments  SpO2 in low 80's with poor waveform on 2L O2 during session, recovered back into mid 90's once supine in bed, new SpO2 sensor applied ? ?  ?Exercises   ?  ?Shoulder Instructions    ? ? ?Home Living Family/patient expects to be discharged to:: Private residence ?Living Arrangements: Spouse/significant other ?Available Help at Discharge: Family;Available 24 hours/day ?Type of Home: Apartment ?Home Access: Level entry ?  ?  ?Home Layout: One level ?  ?  ?Bathroom Shower/Tub: Tub/shower unit;Curtain ?  ?Bathroom Toilet: Standard ?  ?  ?Home Equipment: Rollator (4 wheels);Shower seat;Cane - single point ?  ?  ?  ? ?  ?Prior Functioning/Environment Prior Level of Function : Needs assist ?  ?  ?  ?  ?  ?  ?Mobility Comments: Uses rollator for mobility vs no AD ?ADLs Comments: reports spouse provides standby assist for walking/transfers, assists with IADLs, pt fatigues when showering ?  ? ?  ?  ?OT Problem List: Decreased strength;Decreased range of motion;Decreased activity tolerance;Impaired balance (sitting and/or standing);Cardiopulmonary status limiting activity ?  ?   ?OT Treatment/Interventions: Self-care/ADL training;Therapeutic exercise;Energy conservation;Therapeutic activities;Patient/family education;Balance training  ?  ?OT Goals(Current goals can be found in the care plan section) Acute Rehab OT Goals ?Patient Stated Goal: to get better ?OT Goal Formulation: With patient ?Time For Goal Achievement: 12/08/21 ?Potential to Achieve Goals: Good ?ADL Goals ?Pt Will Perform Upper  Body Dressing: with supervision;sitting ?Pt Will Perform Lower Body Dressing: with min assist;sit to/from stand ?Pt Will Transfer to Toilet: with supervision;ambulating;regular height toilet ?Pt Will Perform Tub/Shower Transfer: rolling walker;shower seat;Tub transfer;with min guard assist  ?OT Frequency: Min 2X/week ?  ? ?Co-evaluation   ?  ?  ?  ?  ? ?  ?AM-PAC OT "6 Clicks" Daily Activity     ?Outcome Measure Help from another person eating meals?: None ?Help from another person taking care of personal grooming?: A Little ?Help from another person toileting, which includes using toliet, bedpan, or urinal?: A Little ?Help from another person bathing (including washing, rinsing, drying)?: A Little ?Help from another person to put on and taking off regular upper body clothing?: A Little ?Help from another person to put on and taking off regular lower body clothing?: A Lot ?6 Click Score: 18 ?  ?End of Session Equipment Utilized During Treatment: Rolling walker (2 wheels);Gait belt ?Nurse Communication: Mobility status ? ?Activity Tolerance: Patient tolerated treatment well ?Patient left: in bed;with call bell/phone within reach;with bed alarm set ? ?OT Visit Diagnosis: Other abnormalities of gait and mobility (R26.89);Unsteadiness on feet (R26.81);Muscle weakness (generalized) (M62.81)  ?              ?Time: [id number] ?OT Time Calculation (min): 27 min ?Charges:  OT General Charges ?$OT Visit: 1 Visit ?OT Evaluation ?$OT Eval  Moderate Complexity: 1 Mod ?OT Treatments ?$Self Care/Home Management : 8-22 mins ? ?Lynnda Child, OTD, OTR/L ?Acute Rehab ?[phone] ? ?Kaylyn Lim ?11/24/2021, 9:53 AM ?

## 2021-11-24 NOTE — Progress Notes (Signed)
Initial Nutrition Assessment ? ?DOCUMENTATION CODES:  ?Not applicable ? ?INTERVENTION:  ?Continue current diet as ordered, encourage PO intake ?Ensure Max po BID, each supplement provides 150 kcal and 30 grams of protein.  ?MVI with minerals daily ? ?NUTRITION DIAGNOSIS:  ?Increased nutrient needs related to chronic illness (CHF, COPD) as evidenced by estimated needs. ? ?GOAL:  ?Patient will meet greater than or equal to 90% of their needs ? ?MONITOR:  ?PO intake, Supplement acceptance, I & O's, Labs ? ?REASON FOR ASSESSMENT:  ?Consult ? (nutritional goals) ? ?ASSESSMENT:  ?81 y.o. male with history of A-fib, CAD, DM type 2, GERD, HTN, HLD, COPD, dCHF, and PAD presented to ED with respiratory distress. ? ?Pt found to be in hypertensive emergency and acute CHF exacerbation. Receiving O2 (not on at baseline) and being diuresed. ? ?Noted pt reported nausea PTA - likely related to cardiac distress. No intake recorded this admission to assess. Weight appears stable with only mild fluctuations which are likely related to fluid.   ? ?Will add nutrition supplements to aid in meeting nutrition needs.  ? ?Nutritionally Relevant Medications: ?Scheduled Meds: ? atorvastatin  40 mg Oral QHS  ? docusate sodium  100 mg Oral BID  ? furosemide  40 mg Intravenous BID  ? insulin aspart  0-15 Units Subcutaneous TID WC  ? insulin aspart  0-5 Units Subcutaneous QHS  ? levothyroxine  25 mcg Oral QAC breakfast  ? pantoprazole  40 mg Oral Daily  ? spironolactone  12.5 mg Oral Daily  ? ?PRN Meds: bisacodyl, ondansetron, polyethylene glycol ? ?Labs Reviewed: ?Potassium 3.4 ?Creatinine 1.95 ?CBG ranges from 132-252 mg/dL over the last 24 hours ?HgbA1c 7.4% (9/25) ? ?NUTRITION - FOCUSED PHYSICAL EXAM: ?Defer to in-person assessment ? ?Diet Order:   ?Diet Order   ? ?       ?  Diet heart healthy/carb modified Room service appropriate? Yes; Fluid consistency: Thin; Fluid restriction: 1500 mL Fluid  Diet effective now       ?  ? ?  ?  ? ?   ? ? ?EDUCATION NEEDS:  ?No education needs have been identified at this time ? ?Skin:  Skin Assessment: Reviewed RN Assessment ? ?Last BM:  3/11 ? ?Height:  ?Ht Readings from Last 1 Encounters:  ?11/23/21 5\' 9"  (1.753 m)  ? ? ?Weight:  ?Wt Readings from Last 1 Encounters:  ?11/24/21 103.1 kg  ? ? ?Ideal Body Weight:  72.7 kg ? ?BMI:  Body mass index is 33.57 kg/m?. ? ?Estimated Nutritional Needs:  ?Kcal:  2000-2200 kcal/d ?Protein:  100-110 g/d ?Fluid:  2L/d ? ? ?Ranell Patrick, RD, LDN ?Clinical Dietitian ?RD pager # available in [place]  ?After hours/weekend pager # available in [place] ?

## 2021-11-24 NOTE — Progress Notes (Signed)
PROGRESS NOTE    Albert Powell  [id number] DOB: December 17, 1940 DOA: 11/23/2021 PCP: Leeroy Cha, MD   Brief Narrative:  Albert Powell is a 81 y.o. male with medical history significant of afib; CAD; chronic diastolic CHF; COPD; DM; HTN; and HLD presenting with SOB.  In the ED patient found to have hypertensive emergency with volume overload A-fib RVR requiring supplemental oxygen and nitroglycerin drip for blood pressure management.  Assessment & Plan:   Principal Problem:   Acute on chronic diastolic CHF (congestive heart failure) ([name]) Active Problems:   Type 2 diabetes mellitus ([name])   Hyperlipidemia   Tobacco abuse   PAD (peripheral artery disease) ([name])   Obesity (BMI 30-39.9)- negative sleep study in the past   Uncontrolled hypertension   PAF (paroxysmal atrial fibrillation) ([name])   Acute hypertensive emergency, POA  Concurrent acute heart failure exacerbation  -Hypertensive emergency at intake improving with nitroglycerin drip, continue to titrate -Transition from metoprolol to carvedilol given borderline bradycardia today with minimally elevated blood pressures -Continue diuretics IV Lasix twice daily, follow I's and O's -Continue home Entresto, spironolactone   Acute hypoxic respiratory failure  -Secondary to above, continue to wean off as tolerated  HLD -Continue Lipitor   PAF (paroxysmal atrial fibrillation) ([name]) -Transition to carvedilol as above -Continue Eliquis   Obesity (BMI 30-39.9)- negative sleep study in the past -Body mass index is 33.23 kg/m..  -Weight loss should be encouraged -Outpatient PCP/bariatric medicine f/u encouraged -Hold Ozempic as inpatient   PAD (peripheral artery disease) ([place]) -s/p stents   Tobacco abuse -Lengthy discussion on need for cessation given above   Type 2 diabetes mellitus ([place]), uncontrolled with hyperglycemia -Recent A1c was 7.4, suboptimal control -hold glipizide, Farxiga -resume at  discharge -Cover with moderate-scale SSI  DVT prophylaxis: Eliquis Code Status: Full Family Communication: None available  Status is: Inpatient  Dispo: The patient is from: Home              Anticipated d/c is to: Home              Anticipated d/c date is: 24 to 48 hours              Patient currently not medically stable for discharge  Consultants:  None  Procedures:  None  Antimicrobials:  None  Subjective: No acute issues or events overnight denies nausea vomiting diarrhea constipation headache fevers chills or chest pain, shortness of breath ongoing but moderately improving.  Objective: Vitals:   11/23/21 2049 11/23/21 2138 11/23/21 2356 11/24/21 0535  BP: (!) 159/69  (!) 153/84 (!) 147/84  Pulse: (!) 115 81 (!) 54 82  Resp:      Temp: 98 F (36.7 C)  97.9 F (36.6 C) (!) 97.5 F (36.4 C)  TempSrc: Oral  Oral Oral  SpO2: 92%  99% 95%  Weight:    103.1 kg  Height:        Intake/Output Summary (Last 24 hours) at 11/24/2021 0746 Last data filed at 11/24/2021 0536 Gross per 24 hour  Intake 200.1 ml  Output 1100 ml  Net -899.9 ml   Filed Weights   11/23/21 0610 11/24/21 0535  Weight: 102.1 kg 103.1 kg    Examination:  General:  Pleasantly resting in bed, No acute distress. Lungs:  Clear to auscultate bilaterally without rhonchi, wheeze, or rales. Heart:  Regular rate and rhythm.  Without murmurs, rubs, or gallops. Abdomen:  Soft, nontender, nondistended.  Without guarding or rebound. Extremities: 1+ pitting edema  bilateral lower extremities    Data Reviewed: I have personally reviewed following labs and imaging studies  CBC: Recent Labs  Lab 11/23/21 0614 11/23/21 0635 11/24/21 0036  WBC 11.6*  --  10.0  HGB 16.0 16.0 13.7  HCT 48.5 47.0 40.5  MCV 89.3  --  89.4  PLT 284  --  0000000   Basic Metabolic Panel: Recent Labs  Lab 11/23/21 0614 11/23/21 0635 11/24/21 0036  NA 135 137 135  K 3.4* 3.2* 3.4*  CL 98 98 98  CO2 26  --  26  GLUCOSE  203* 199* 151*  BUN 18 19 23   CREATININE 1.71* 1.60* 1.95*  CALCIUM 9.0  --  8.4*  MG 2.0  --   --    GFR: Estimated Creatinine Clearance: 36.4 mL/min (A) (by C-G formula based on SCr of 1.95 mg/dL (H)). Liver Function Tests: Recent Labs  Lab 11/23/21 0614  AST 20  ALT 27  ALKPHOS 77  BILITOT 0.7  PROT 6.6  ALBUMIN 3.1*   No results for input(s): LIPASE, AMYLASE in the last 168 hours. No results for input(s): AMMONIA in the last 168 hours. Coagulation Profile: No results for input(s): INR, PROTIME in the last 168 hours. Cardiac Enzymes: No results for input(s): CKTOTAL, CKMB, CKMBINDEX, TROPONINI in the last 168 hours. BNP (last 3 results) No results for input(s): PROBNP in the last 8760 hours. HbA1C: No results for input(s): HGBA1C in the last 72 hours. CBG: Recent Labs  Lab 11/23/21 1206 11/23/21 1645 11/23/21 2101 11/24/21 0737  GLUCAP 252* 132* 179* 145*   Lipid Profile: No results for input(s): CHOL, HDL, LDLCALC, TRIG, CHOLHDL, LDLDIRECT in the last 72 hours. Thyroid Function Tests: No results for input(s): TSH, T4TOTAL, FREET4, T3FREE, THYROIDAB in the last 72 hours. Anemia Panel: No results for input(s): VITAMINB12, FOLATE, FERRITIN, TIBC, IRON, RETICCTPCT in the last 72 hours. Sepsis Labs: No results for input(s): PROCALCITON, LATICACIDVEN in the last 168 hours.  Recent Results (from the past 240 hour(s))  Resp Panel by RT-PCR (Flu A&B, Covid) Nasopharyngeal Swab     Status: None   Collection Time: 11/23/21  6:12 AM   Specimen: Nasopharyngeal Swab; Nasopharyngeal(NP) swabs in vial transport medium  Result Value Ref Range Status   SARS Coronavirus 2 by RT PCR NEGATIVE NEGATIVE Final    Comment: (NOTE) SARS-CoV-2 target nucleic acids are NOT DETECTED.  The SARS-CoV-2 RNA is generally detectable in upper respiratory specimens during the acute phase of infection. The lowest concentration of SARS-CoV-2 viral copies this assay can detect is 138 copies/mL.  A negative result does not preclude SARS-Cov-2 infection and should not be used as the sole basis for treatment or other patient management decisions. A negative result may occur with  improper specimen collection/handling, submission of specimen other than nasopharyngeal swab, presence of viral mutation(s) within the areas targeted by this assay, and inadequate number of viral copies(<138 copies/mL). A negative result must be combined with clinical observations, patient history, and epidemiological information. The expected result is Negative.  Fact Sheet for Patients:  EntrepreneurPulse.com.au  Fact Sheet for Healthcare Providers:  IncredibleEmployment.be  This test is no t yet approved or cleared by the [place] FDA and  has been authorized for detection and/or diagnosis of SARS-CoV-2 by FDA under an Emergency Use Authorization (EUA). This EUA will remain  in effect (meaning this test can be used) for the duration of the COVID-19 declaration under Section 564(b)(1) of the Act, 21 U.S.C.section 360bbb-3(b)(1), unless the authorization is  terminated  or revoked sooner.       Influenza A by PCR NEGATIVE NEGATIVE Final   Influenza B by PCR NEGATIVE NEGATIVE Final    Comment: (NOTE) The Xpert Xpress SARS-CoV-2/FLU/RSV plus assay is intended as an aid in the diagnosis of influenza from Nasopharyngeal swab specimens and should not be used as a sole basis for treatment. Nasal washings and aspirates are unacceptable for Xpert Xpress SARS-CoV-2/FLU/RSV testing.  Fact Sheet for Patients: EntrepreneurPulse.com.au  Fact Sheet for Healthcare Providers: IncredibleEmployment.be  This test is not yet approved or cleared by the [place] FDA and has been authorized for detection and/or diagnosis of SARS-CoV-2 by FDA under an Emergency Use Authorization (EUA). This EUA will remain in effect (meaning this test  can be used) for the duration of the COVID-19 declaration under Section 564(b)(1) of the Act, 21 U.S.C. section 360bbb-3(b)(1), unless the authorization is terminated or revoked.  Performed at [facility], [place] [address], [place], [place] [address]          Radiology Studies: DG Chest Port 1 View  Result Date: 11/23/2021 CLINICAL DATA:  Shortness of breath with atrial fibrillation with RVR. EXAM: PORTABLE CHEST 1 VIEW COMPARISON:  AP Lat 08/06/2021 FINDINGS: Moderate cardiomegaly is again noted. There is perihilar vascular congestion and mild interstitial edema of the central and lower zonal areas of both lungs, with small pleural effusions. There is patchy hazy opacity in the lower lung fields which could be atelectasis, pneumonia, ground-glass edema or combination. The mid/upper lung fields are clear of focal infiltrates with COPD. There is aortic atherosclerosis.  Stable mediastinum. Neurostimulator wiring terminates in the midthoracic spinal canal. IMPRESSION: 1. Perihilar vascular congestion with central and lower zonal interstitial edema consistent with CHF or fluid overload. 2. Small pleural effusions, with patchy hazy opacities in the lower lung fields, which could be ground-glass edema, atelectasis, pneumonia or combination. 3. Clinical correlation and radiographic follow-up recommended. 4. Aortic atherosclerosis. Electronically Signed   By: Telford Nab M.D.   On: 11/23/2021 06:32        Scheduled Meds:  apixaban  5 mg Oral BID   atorvastatin  40 mg Oral QHS   clopidogrel  75 mg Oral Daily   docusate sodium  100 mg Oral BID   escitalopram  20 mg Oral Daily   furosemide  40 mg Intravenous BID   insulin aspart  0-15 Units Subcutaneous TID WC   insulin aspart  0-5 Units Subcutaneous QHS   levothyroxine  25 mcg Oral QAC breakfast   metoprolol tartrate  12.5 mg Oral BID   nicotine  14 mg Transdermal Daily   pantoprazole  40 mg Oral Daily   sacubitril-valsartan  1  tablet Oral BID   sodium chloride flush  3 mL Intravenous Q12H   spironolactone  12.5 mg Oral Daily   Continuous Infusions:  nitroGLYCERIN 100 mcg/min (11/23/21 1400)     LOS: 1 day    Time spent: 14min    Nayef College C Aaiden Depoy, DO [facility]  If 7PM-7AM, please contact night-coverage [email or web address]  11/24/2021, 7:46 AM

## 2021-11-25 ENCOUNTER — Inpatient Hospital Stay (HOSPITAL_COMMUNITY): Payer: Medicare Other

## 2021-11-25 DIAGNOSIS — I5033 Acute on chronic diastolic (congestive) heart failure: Secondary | ICD-10-CM | POA: Diagnosis not present

## 2021-11-25 LAB — ECHOCARDIOGRAM COMPLETE
AR max vel: 2.43 cm2
AV Area VTI: 2.99 cm2
AV Area mean vel: 2.2 cm2
AV Mean grad: 4 mmHg
AV Peak grad: 7.3 mmHg
Ao pk vel: 1.35 m/s
Area-P 1/2: 3.08 cm2
Calc EF: 32.6 %
Height: 69 in
MV VTI: 1.85 cm2
S' Lateral: 2.8 cm
Single Plane A2C EF: 26.9 %
Single Plane A4C EF: 40 %
Weight: 3576 oz

## 2021-11-25 LAB — CBC
HCT: 43.1 % (ref 39.0–52.0)
Hemoglobin: 14.5 g/dL (ref 13.0–17.0)
MCH: 30.1 pg (ref 26.0–34.0)
MCHC: 33.6 g/dL (ref 30.0–36.0)
MCV: 89.6 fL (ref 80.0–100.0)
Platelets: 248 10*3/uL (ref 150–400)
RBC: 4.81 MIL/uL (ref 4.22–5.81)
RDW: 13.7 % (ref 11.5–15.5)
WBC: 8.6 10*3/uL (ref 4.0–10.5)
nRBC: 0 % (ref 0.0–0.2)

## 2021-11-25 LAB — BASIC METABOLIC PANEL
Anion gap: 9 (ref 5–15)
BUN: 23 mg/dL (ref 8–23)
CO2: 30 mmol/L (ref 22–32)
Calcium: 8.6 mg/dL — ABNORMAL LOW (ref 8.9–10.3)
Chloride: 101 mmol/L (ref 98–111)
Creatinine, Ser: 1.79 mg/dL — ABNORMAL HIGH (ref 0.61–1.24)
GFR, Estimated: 38 mL/min — ABNORMAL LOW (ref >60)
Glucose, Bld: 145 mg/dL — ABNORMAL HIGH (ref 70–99)
Potassium: 4.1 mmol/L (ref 3.5–5.1)
Sodium: 140 mmol/L (ref 135–145)

## 2021-11-25 LAB — GLUCOSE, CAPILLARY
Glucose-Capillary: 128 mg/dL — ABNORMAL HIGH (ref 70–99)
Glucose-Capillary: 150 mg/dL — ABNORMAL HIGH (ref 70–99)
Glucose-Capillary: 196 mg/dL — ABNORMAL HIGH (ref 70–99)
Glucose-Capillary: 117 mg/dL — ABNORMAL HIGH (ref 70–99)

## 2021-11-25 NOTE — Progress Notes (Signed)
Physical Therapy Treatment ?Patient Details ?Name: Albert Powell ?MRN: [id number] ?DOB: June 26, 1941 ?Today's Date: 11/25/2021 ? ? ?History of Present Illness Pt is a 81 y.o. M who presents 11/23/2021 with SOB. Admitted with acute on chronic diastolic CHF. CXR consistent with pulmonary edema. Significant PMH:  HTN, HLD, PAF, obesity, afib, CAD, COPD. ? ?  ?PT Comments  ? ? Pt  eager to return home but reports feeling out of sorts this am and having not eaten anything. Provided crackers and pt ordered lunch. PT with slightly improved gait tolerance with education for progressive walking program at home. Pt reports limited gait to grossly 2 min standing tolerance due to pain with education for increased trials if distance is not able to progress. Pt educated for HEP and verbalized understanding. Return home is appropriate.  ? ?HR 82 ?Sats 97% on RA ? ?  ?Recommendations for follow up therapy are one component of a multi-disciplinary discharge planning process, led by the attending physician.  Recommendations may be updated based on patient status, additional functional criteria and insurance authorization. ? ?Follow Up Recommendations ? Home health PT ?  ?  ?Assistance Recommended at Discharge PRN  ?Patient can return home with the following A little help with bathing/dressing/bathroom;Assist for transportation;A little help with walking and/or transfers ?  ?Equipment Recommendations ? None recommended by PT  ?  ?Recommendations for Other Services   ? ? ?  ?Precautions / Restrictions Precautions ?Precautions: Fall  ?  ? ?Mobility ? Bed Mobility ?  ?  ?  ?  ?  ?  ?  ?General bed mobility comments: EOB on arrival ?  ? ?Transfers ?Overall transfer level: Modified independent ?  ?  ?  ?  ?  ?  ?  ?  ?General transfer comment: decreased control of descent with cues for safety ?  ? ?Ambulation/Gait ?Ambulation/Gait assistance: Supervision ?Gait Distance (Feet): 110 Feet ?Assistive device: Rolling walker (2 wheels) ?Gait  Pattern/deviations: Step-through pattern, Decreased stride length ?  ?Gait velocity interpretation: 1.31 - 2.62 ft/sec, indicative of limited community ambulator ?  ?General Gait Details: cues for posture and proximity to RW. Pt self directing distance and limited by back pain ? ? ?Stairs ?  ?  ?  ?  ?  ? ? ?Wheelchair Mobility ?  ? ?Modified Rankin (Stroke Patients Only) ?  ? ? ?  ?Balance Overall balance assessment: Needs assistance ?Sitting-balance support: No upper extremity supported, Feet supported ?Sitting balance-Leahy Scale: Good ?Sitting balance - Comments: EOB without assist ?  ?Standing balance support: Bilateral upper extremity supported, During functional activity ?Standing balance-Leahy Scale: Poor ?Standing balance comment: reliant on RW ?  ?  ?  ?  ?  ?  ?  ?  ?  ?  ?  ?  ? ?  ?Cognition Arousal/Alertness: Awake/alert ?Behavior During Therapy: [facility] for tasks assessed/performed ?Overall Cognitive Status: Within Functional Limits for tasks assessed ?  ?  ?  ?  ?  ?  ?  ?  ?  ?  ?  ?  ?  ?  ?  ?  ?  ?  ?  ? ?  ?Exercises General Exercises - Lower Extremity ?Long Arc Quad: AROM, Both, Seated, 15 reps ?Hip Flexion/Marching: AROM, Both, Seated, 10 reps ?Toe Raises: AROM, Both, 15 reps, Seated ?Heel Raises: AROM, Both, 15 reps, Seated ? ?  ?General Comments   ?  ?  ? ?Pertinent Vitals/Pain Pain Assessment ?Pain Assessment: No/denies pain  ? ? ?Home Living   ?  ?  ?  ?  ?  ?  ?  ?  ?  ?   ?  ?  Prior Function    ?  ?  ?   ? ?PT Goals (current goals can now be found in the care plan section) Progress towards PT goals: Progressing toward goals ? ?  ?Frequency ? ? ? Min 3X/week ? ? ? ?  ?PT Plan Current plan remains appropriate  ? ? ?Co-evaluation   ?  ?  ?  ?  ? ?  ?AM-PAC PT "6 Clicks" Mobility   ?Outcome Measure ? Help needed turning from your back to your side while in a flat bed without using bedrails?: None ?Help needed moving from lying on your back to sitting on the side of a flat bed without using  bedrails?: None ?Help needed moving to and from a bed to a chair (including a wheelchair)?: None ?Help needed standing up from a chair using your arms (e.g., wheelchair or bedside chair)?: A Little ?Help needed to walk in hospital room?: A Little ?Help needed climbing 3-5 steps with a railing? : A Lot ?6 Click Score: 20 ? ?  ?End of Session   ?Activity Tolerance: Patient tolerated treatment well ?Patient left: in chair;with call bell/phone within reach ?Nurse Communication: Mobility status ?PT Visit Diagnosis: Unsteadiness on feet (R26.81);Difficulty in walking, not elsewhere classified (R26.2) ?  ? ? ?Time: [id number] ?PT Time Calculation (min) (ACUTE ONLY): 22 min ? ?Charges:  $Gait Training: 8-22 mins          ?     ?Albert Powell, PT ?Acute Rehabilitation Services ?Pager: [phone] ?Office: [phone] ? ? ? ?Albert Powell ?11/25/2021, 1:50 PM ? ?

## 2021-11-25 NOTE — Progress Notes (Signed)
Heart Failure Nurse Navigator Progress Note  ? ?Following for HF issues, pending updated ECHO, Patient has ben seen by [facility], 08/15/21, 08/29/21. Refer backed to general Cards with Dr. Allyson Sabal. Patient has FU with Dr. Allyson Sabal scheduled with Dr. Allyson Sabal 12/31/21.  ? ?If changes noted in EF will plan to bring back to [facility], if no significant changes will defer to general cards for FU appointment.  ? ? ?Rhae Hammock, BSN, RN ?Heart Failure Nurse Navigator ?[phone]  ? ?

## 2021-11-25 NOTE — Progress Notes (Signed)
PROGRESS NOTE    Albert Powell  [id number] DOB: [birth year] DOA: 11/23/2021 PCP: Leeroy Cha, MD   Brief Narrative:  Albert Powell is a 81 y.o. male with medical history significant of afib; CAD; chronic diastolic CHF; COPD; DM; HTN; and HLD presenting with SOB.  In the ED patient found to have hypertensive emergency with volume overload A-fib RVR requiring supplemental oxygen and nitroglycerin drip for blood pressure management.  Assessment & Plan:   Principal Problem:   Acute on chronic diastolic CHF (congestive heart failure) ([name]) Active Problems:   Type 2 diabetes mellitus ([name])   Hyperlipidemia   Tobacco abuse   PAD (peripheral artery disease) ([name])   Obesity (BMI 30-39.9)- negative sleep study in the past   Uncontrolled hypertension   PAF (paroxysmal atrial fibrillation) ([name])  Acute hypertensive emergency, POA  Concurrent acute heart failure exacerbation  -Hypertensive emergency at intake resolved, nitro drip off, titrate home medications: -Continue carvedilol, Lasix, Entresto, spironolactone -Patient diuresing well, not quite euvolemic, creatinine labile but generally stable   Acute hypoxic respiratory failure  -Secondary to above, continue to wean off as tolerated -Patient ambulated today on room air -desats into the 70s, will need ongoing diuresis as above  HLD -Continue Lipitor   PAF (paroxysmal atrial fibrillation) ([name]) -Transition to carvedilol as above -Continue Eliquis   Obesity (BMI 30-39.9)- negative sleep study in the past -Body mass index is 33.23 kg/m..  -Weight loss should be encouraged -Outpatient PCP/bariatric medicine f/u encouraged -Hold Ozempic as inpatient   PAD (peripheral artery disease) ([place]) -s/p stents   Tobacco abuse -Lengthy discussion on need for cessation given above   Type 2 diabetes mellitus ([place]), uncontrolled with hyperglycemia -Recent A1c was 7.4, suboptimal control -hold glipizide, Farxiga -resume  at discharge -Cover with moderate-scale SSI, hypoglycemic protocol  DVT prophylaxis: Eliquis Code Status: Full Family Communication: None available  Status is: Inpatient  Dispo: The patient is from: Home              Anticipated d/c is to: Home              Anticipated d/c date is: 24 to 48 hours              Patient currently not medically stable for discharge given ongoing profound hypoxia with exertion  Consultants:  None  Procedures:  None  Antimicrobials:  None  Subjective: No acute issues or events overnight denies nausea vomiting diarrhea constipation headache fevers chills or chest pain, shortness of breath ongoing but moderately improving -remains markedly dyspneic with exertion.  Objective: Vitals:   11/24/21 0909 11/24/21 1349 11/24/21 2019 11/25/21 0431  BP: (!) 138/55 133/66 (!) 151/77 133/70  Pulse:  85 64 60  Resp:  18    Temp:  98.3 F (36.8 C) 97.9 F (36.6 C) (!) 97.4 F (36.3 C)  TempSrc:  Oral Oral Oral  SpO2:  100% 97% 99%  Weight:    101.4 kg  Height:        Intake/Output Summary (Last 24 hours) at 11/25/2021 0749 Last data filed at 11/25/2021 0731 Gross per 24 hour  Intake 240 ml  Output 1075 ml  Net -835 ml    Filed Weights   11/23/21 0610 11/24/21 0535 11/25/21 0431  Weight: 102.1 kg 103.1 kg 101.4 kg    Examination:  General:  Pleasantly resting in bed, No acute distress. Lungs:  Clear to auscultate bilaterally without rhonchi, wheeze, or rales. Heart:  Regular rate and rhythm.  Without  murmurs, rubs, or gallops. Abdomen:  Soft, nontender, nondistended.  Without guarding or rebound. Extremities: 1+ pitting edema bilateral lower extremities  Data Reviewed: I have personally reviewed following labs and imaging studies  CBC: Recent Labs  Lab 11/23/21 0614 11/23/21 0635 11/24/21 0036  WBC 11.6*  --  10.0  HGB 16.0 16.0 13.7  HCT 48.5 47.0 40.5  MCV 89.3  --  89.4  PLT 284  --  0000000    Basic Metabolic Panel: Recent Labs   Lab 11/23/21 0614 11/23/21 0635 11/24/21 0036  NA 135 137 135  K 3.4* 3.2* 3.4*  CL 98 98 98  CO2 26  --  26  GLUCOSE 203* 199* 151*  BUN 18 19 23   CREATININE 1.71* 1.60* 1.95*  CALCIUM 9.0  --  8.4*  MG 2.0  --   --     GFR: Estimated Creatinine Clearance: 36.1 mL/min (A) (by C-G formula based on SCr of 1.95 mg/dL (H)). Liver Function Tests: Recent Labs  Lab 11/23/21 0614  AST 20  ALT 27  ALKPHOS 77  BILITOT 0.7  PROT 6.6  ALBUMIN 3.1*    No results for input(s): LIPASE, AMYLASE in the last 168 hours. No results for input(s): AMMONIA in the last 168 hours. Coagulation Profile: No results for input(s): INR, PROTIME in the last 168 hours. Cardiac Enzymes: No results for input(s): CKTOTAL, CKMB, CKMBINDEX, TROPONINI in the last 168 hours. BNP (last 3 results) No results for input(s): PROBNP in the last 8760 hours. HbA1C: No results for input(s): HGBA1C in the last 72 hours. CBG: Recent Labs  Lab 11/24/21 0737 11/24/21 1152 11/24/21 1610 11/24/21 2112 11/25/21 0727  GLUCAP 145* 146* 228* 118* 128*    Lipid Profile: No results for input(s): CHOL, HDL, LDLCALC, TRIG, CHOLHDL, LDLDIRECT in the last 72 hours. Thyroid Function Tests: No results for input(s): TSH, T4TOTAL, FREET4, T3FREE, THYROIDAB in the last 72 hours. Anemia Panel: No results for input(s): VITAMINB12, FOLATE, FERRITIN, TIBC, IRON, RETICCTPCT in the last 72 hours. Sepsis Labs: No results for input(s): PROCALCITON, LATICACIDVEN in the last 168 hours.  Recent Results (from the past 240 hour(s))  Resp Panel by RT-PCR (Flu A&B, Covid) Nasopharyngeal Swab     Status: None   Collection Time: 11/23/21  6:12 AM   Specimen: Nasopharyngeal Swab; Nasopharyngeal(NP) swabs in vial transport medium  Result Value Ref Range Status   SARS Coronavirus 2 by RT PCR NEGATIVE NEGATIVE Final    Comment: (NOTE) SARS-CoV-2 target nucleic acids are NOT DETECTED.  The SARS-CoV-2 RNA is generally detectable in upper  respiratory specimens during the acute phase of infection. The lowest concentration of SARS-CoV-2 viral copies this assay can detect is 138 copies/mL. A negative result does not preclude SARS-Cov-2 infection and should not be used as the sole basis for treatment or other patient management decisions. A negative result may occur with  improper specimen collection/handling, submission of specimen other than nasopharyngeal swab, presence of viral mutation(s) within the areas targeted by this assay, and inadequate number of viral copies(<138 copies/mL). A negative result must be combined with clinical observations, patient history, and epidemiological information. The expected result is Negative.  Fact Sheet for Patients:  EntrepreneurPulse.com.au  Fact Sheet for Healthcare Providers:  IncredibleEmployment.be  This test is no t yet approved or cleared by the [place] FDA and  has been authorized for detection and/or diagnosis of SARS-CoV-2 by FDA under an Emergency Use Authorization (EUA). This EUA will remain  in effect (meaning this test  can be used) for the duration of the COVID-19 declaration under Section 564(b)(1) of the Act, 21 U.S.C.section 360bbb-3(b)(1), unless the authorization is terminated  or revoked sooner.       Influenza A by PCR NEGATIVE NEGATIVE Final   Influenza B by PCR NEGATIVE NEGATIVE Final    Comment: (NOTE) The Xpert Xpress SARS-CoV-2/FLU/RSV plus assay is intended as an aid in the diagnosis of influenza from Nasopharyngeal swab specimens and should not be used as a sole basis for treatment. Nasal washings and aspirates are unacceptable for Xpert Xpress SARS-CoV-2/FLU/RSV testing.  Fact Sheet for Patients: EntrepreneurPulse.com.au  Fact Sheet for Healthcare Providers: IncredibleEmployment.be  This test is not yet approved or cleared by the [place] FDA and has been  authorized for detection and/or diagnosis of SARS-CoV-2 by FDA under an Emergency Use Authorization (EUA). This EUA will remain in effect (meaning this test can be used) for the duration of the COVID-19 declaration under Section 564(b)(1) of the Act, 21 U.S.C. section 360bbb-3(b)(1), unless the authorization is terminated or revoked.  Performed at [facility], [place] [address], [place], [place] [address]           Radiology Studies: No results found.      Scheduled Meds:  apixaban  5 mg Oral BID   atorvastatin  40 mg Oral QHS   carvedilol  6.25 mg Oral BID WC   clopidogrel  75 mg Oral Daily   docusate sodium  100 mg Oral BID   escitalopram  20 mg Oral Daily   furosemide  40 mg Intravenous BID   insulin aspart  0-15 Units Subcutaneous TID WC   insulin aspart  0-5 Units Subcutaneous QHS   levothyroxine  25 mcg Oral QAC breakfast   multivitamin with minerals  1 tablet Oral Daily   nicotine  14 mg Transdermal Daily   pantoprazole  40 mg Oral Daily   Ensure Max Protein  11 oz Oral BID   sacubitril-valsartan  1 tablet Oral BID   sodium chloride flush  3 mL Intravenous Q12H   spironolactone  12.5 mg Oral Daily   Continuous Infusions:  nitroGLYCERIN 100 mcg/min (11/23/21 1400)     LOS: 2 days    Time spent: 16min    Addaleigh Nicholls C Asako Saliba, DO [facility]  If 7PM-7AM, please contact night-coverage [email or web address]  11/25/2021, 7:49 AM

## 2021-11-25 NOTE — Progress Notes (Signed)
SATURATION QUALIFICATIONS: (This note is used to comply with regulatory documentation for home oxygen) ? ?Patient Saturations on Room Air at Rest = 95 % ? ?Patient Saturations on Room Air while Ambulating = 72 % ? ?Patient Saturations on 2 Liters of oxygen while Ambulating = 92 % ? ?Please briefly explain why patient needs home oxygen: ?

## 2021-11-26 ENCOUNTER — Other Ambulatory Visit (HOSPITAL_COMMUNITY): Payer: Self-pay

## 2021-11-26 DIAGNOSIS — I5033 Acute on chronic diastolic (congestive) heart failure: Secondary | ICD-10-CM | POA: Diagnosis not present

## 2021-11-26 LAB — GLUCOSE, CAPILLARY
Glucose-Capillary: 172 mg/dL — ABNORMAL HIGH (ref 70–99)
Glucose-Capillary: 174 mg/dL — ABNORMAL HIGH (ref 70–99)
Glucose-Capillary: 178 mg/dL — ABNORMAL HIGH (ref 70–99)
Glucose-Capillary: 219 mg/dL — ABNORMAL HIGH (ref 70–99)

## 2021-11-26 MED ORDER — ALUM & MAG HYDROXIDE-SIMETH 200-200-20 MG/5ML PO SUSP
30.0000 mL | Freq: Four times a day (QID) | ORAL | Status: DC | PRN
  Administered 2021-11-26: 30 mL via ORAL
  Filled 2021-11-26: qty 30

## 2021-11-26 NOTE — Progress Notes (Addendum)
Physical Therapy Treatment ?Patient Details ?Name: Albert Powell ?MRN: [id number] ?DOB: Apr 13, 1941 ?Today's Date: 11/26/2021 ? ? ?History of Present Illness Pt is a 81 y.o. M who presents 11/23/2021 with SOB. Admitted with acute on chronic diastolic CHF. CXR consistent with pulmonary edema. Significant PMH:  HTN, HLD, PAF, obesity, afib, CAD, COPD. ? ?  ?PT Comments  ? ? Pt received in supine, agreeable to therapy session and with good participation for longer household distance gait trial. Pt eager to toilet once returned to room and to eat lunch, so deferred second gait trial. Pt agreeable to sit up in recliner, chair alarm on for safety due to moderate fall risk and pt with poor recall of safe UE placement with transfers. Pt needing up to min guard to perform functional mobility tasks, mostly Supervision after initial safety cues for gait using RW. SpO2 with noisy signal during hallway ambulation, SpO2 reading 93% briefly but pt not dyspneic and asx so remained on RA. SpO2 100% on RA once seated in chair. Pt continues to benefit from PT services to progress toward functional mobility goals.  ?Recommendations for follow up therapy are one component of a multi-disciplinary discharge planning process, led by the attending physician.  Recommendations may be updated based on patient status, additional functional criteria and insurance authorization. ? ?Follow Up Recommendations ? Home health PT ?  ?  ?Assistance Recommended at Discharge PRN  ?Patient can return home with the following A little help with bathing/dressing/bathroom;Assist for transportation;A little help with walking and/or transfers ?  ?Equipment Recommendations ? None recommended by PT  ?  ?Recommendations for Other Services   ? ? ?  ?Precautions / Restrictions Precautions ?Precautions: Fall ?Precaution Comments: watch O2 ?Restrictions ?Weight Bearing Restrictions: No  ?  ? ?Mobility ? Bed Mobility ?Overal bed mobility: Needs Assistance ?Bed Mobility:  Supine to Sit ? Supine to sit: Min guard ?  ?  ?General bed mobility comments: from flat HOB no rails, needs assist to manage blankets/SpO2 lines. Increased effort/time to rise from flat bed. ?  ? ?Transfers ?Overall transfer level: Needs assistance ?Equipment used: Rolling walker (2 wheels) ?Transfers: Sit to/from Stand ?Sit to Stand: Supervision ?  ?  ?  ?  ?  ?General transfer comment: cues for safe UE placement for sit>stand and for stand>sit, pt initially pulling up on RW. ?  ? ?Ambulation/Gait ?Ambulation/Gait assistance: Supervision ?Gait Distance (Feet): 120 Feet ?Assistive device: Rolling walker (2 wheels) ?Gait Pattern/deviations: Step-through pattern, Decreased stride length ?  ?  ?  ?General Gait Details: forward head/downward gaze, pt given postural cues intermittently and for activity pacing; Fair use of RW ? ? ? ?  ?Balance Overall balance assessment: Needs assistance ?Sitting-balance support: No upper extremity supported, Feet supported ?Sitting balance-Leahy Scale: Good ?  ?  ?Standing balance support: Bilateral upper extremity supported, No upper extremity supported, During functional activity ?Standing balance-Leahy Scale: Poor ?Standing balance comment: able to perform two handed tasks while standing at sink but reliant on RW for longer distance gait task ?  ?  ?  ?  ?  ?  ?  ?  ?  ?  ?  ?  ? ?  ?Cognition Arousal/Alertness: Awake/alert ?Behavior During Therapy: [facility] for tasks assessed/performed ?Overall Cognitive Status: Within Functional Limits for tasks assessed ?  ?  ?  ?  ?  ?  ?  ?  ?  ?  ?  ?  ?  ?  ?  ?  ?General Comments: He  required cues for safety and attention to task, pt instructed on plan for session but multiple times asking for what the plan was, unclear if pt HoH (he states he is not) or having trouble absorbing instruction from therapist. ?  ?  ? ?  ?Exercises   ? ?  ?General Comments General comments (skin integrity, edema, etc.): SpO2 noisy signal, SpO2 93% briefly reading  during gait trial on RA, SpO2 reading 100% on RA resting; HR 76 bpm seated in chair. 1/4 DOE. ?  ?  ? ?Pertinent Vitals/Pain Pain Assessment ?Pain Assessment: No/denies pain  ? ? ?   ?   ? ?PT Goals (current goals can now be found in the care plan section) Acute Rehab PT Goals ?PT Goal Formulation: With patient ?Time For Goal Achievement: 12/07/21 ?Progress towards PT goals: Progressing toward goals ? ?  ?Frequency ? ? ? Min 3X/week ? ? ? ?  ?PT Plan Current plan remains appropriate  ? ? ?   ?AM-PAC PT "6 Clicks" Mobility   ?Outcome Measure ? Help needed turning from your back to your side while in a flat bed without using bedrails?: None ?Help needed moving from lying on your back to sitting on the side of a flat bed without using bedrails?: A Little ?Help needed moving to and from a bed to a chair (including a wheelchair)?: A Little ?Help needed standing up from a chair using your arms (e.g., wheelchair or bedside chair)?: A Little ?Help needed to walk in hospital room?: A Little ?Help needed climbing 3-5 steps with a railing? : A Little ?6 Click Score: 19 ? ?  ?End of Session Equipment Utilized During Treatment: Gait belt ?Activity Tolerance: Patient tolerated treatment well ?Patient left: in chair;with call bell/phone within reach;with chair alarm set ?Nurse Communication: Mobility status ?PT Visit Diagnosis: Unsteadiness on feet (R26.81);Difficulty in walking, not elsewhere classified (R26.2) ?  ? ? ?Time: [id number] ?PT Time Calculation (min) (ACUTE ONLY): 16 min ? ?Charges:  $Gait Training: 8-22 mins          ?          ? ?Jong Rickman P., PTA ?Acute Rehabilitation Services ?Pager: [phone] ?Office: [phone]  ? ? ?Markan Cazarez M Lehi Phifer ?11/26/2021, 2:04 PM ? ?

## 2021-11-26 NOTE — Progress Notes (Signed)
SATURATION QUALIFICATIONS: (This note is used to comply with regulatory documentation for home oxygen) ? ?Patient Saturations on Room Air at Rest = 97 % ? ?Patient Saturations on Room Air while Ambulating = 82 % ? ?Patient Saturations on 3 Liters of oxygen while Ambulating =  94 % ? ?Please briefly explain why patient needs home oxygen: Pt did complain of slightly short of breath ?

## 2021-11-26 NOTE — Progress Notes (Signed)
Occupational Therapy Treatment ?Patient Details ?Name: Albert Powell ?MRN: [id number] ?DOB: 11-24-1940 ?Today's Date: 11/26/2021 ? ? ?History of present illness Pt is a 81 y.o. M who presents 11/23/2021 with SOB. Admitted with acute on chronic diastolic CHF. CXR consistent with pulmonary edema. Significant PMH:  HTN, HLD, PAF, obesity, afib, CAD, COPD. ?  ?OT comments ? Patient making good progress with OT treatment. Patient was able to stand at sink for grooming and UB bathing.  Min assist for doffing and donning socks and supervision with toilet transfers and hygiene. Patient demonstrated increased activity tolerance being able to stand for grooming and UB bathing before requiring seated break. Acute OT to continue to follow.   ? ?Recommendations for follow up therapy are one component of a multi-disciplinary discharge planning process, led by the attending physician.  Recommendations may be updated based on patient status, additional functional criteria and insurance authorization. ?   ?Follow Up Recommendations ? Home health OT  ?  ?Assistance Recommended at Discharge Set up Supervision/Assistance  ?Patient can return home with the following ? A little help with walking and/or transfers;A little help with bathing/dressing/bathroom;Assistance with cooking/housework ?  ?Equipment Recommendations ? BSC/3in1  ?  ?Recommendations for Other Services   ? ?  ?Precautions / Restrictions Precautions ?Precautions: Fall ?Precaution Comments: watch O2 ?Restrictions ?Weight Bearing Restrictions: No  ? ? ?  ? ?Mobility Bed Mobility ?Overal bed mobility: Needs Assistance ?Bed Mobility: Sit to Supine ?  ?  ?Supine to sit: Min guard ?  ?  ?General bed mobility comments: min guard to scoot to EOB ?  ? ?Transfers ?Overall transfer level: Modified independent ?Equipment used: Rolling walker (2 wheels) ?Transfers: Sit to/from Stand ?  ?  ?  ?  ?  ?  ?General transfer comment: min guard for safety fo rtransfer to recliner ?  ?   ?Balance Overall balance assessment: Needs assistance ?Sitting-balance support: No upper extremity supported, Feet supported ?Sitting balance-Leahy Scale: Good ?  ?  ?Standing balance support: Bilateral upper extremity supported, No upper extremity supported, During functional activity ?Standing balance-Leahy Scale: Poor ?Standing balance comment: able to perform two handed tasks while standing at sink ?  ?  ?  ?  ?  ?  ?  ?  ?  ?  ?  ?   ? ?ADL either performed or assessed with clinical judgement  ? ?ADL Overall ADL's : Needs assistance/impaired ?  ?  ?Grooming: Wash/dry hands;Wash/dry face;Oral care;Brushing hair;Standing;Min guard ?Grooming Details (indicate cue type and reason): stood at sink ?Upper Body Bathing: Min guard;Standing ?  ?Lower Body Bathing: Minimal assistance;Sit to/from stand ?  ?Upper Body Dressing : Minimal assistance;Sitting ?Upper Body Dressing Details (indicate cue type and reason): changed gowns ?Lower Body Dressing: Minimal assistance;Sitting/lateral leans ?Lower Body Dressing Details (indicate cue type and reason): changed socks ?Toilet Transfer: Min guard;Rolling walker (2 wheels);Regular Toilet;Ambulation ?  ?Toileting- Clothing Manipulation and Hygiene: Supervision/safety;Sitting/lateral lean ?  ?  ?  ?  ?General ADL Comments: eager to perform tasks without assistance ?  ? ?Extremity/Trunk Assessment   ?  ?  ?  ?  ?  ? ?Vision   ?  ?  ?Perception   ?  ?Praxis   ?  ? ?Cognition Arousal/Alertness: Awake/alert ?Behavior During Therapy: [facility] for tasks assessed/performed ?Overall Cognitive Status: Within Functional Limits for tasks assessed ?  ?  ?  ?  ?  ?  ?  ?  ?  ?  ?  ?  ?  ?  ?  ?  ?  General Comments: unaware of month or day, oriented to situation and place  required cues for safety ?  ?  ?   ?Exercises   ? ?  ?Shoulder Instructions   ? ? ?  ?General Comments    ? ? ?Pertinent Vitals/ Pain       Pain Assessment ?Pain Assessment: No/denies pain ? ?Home Living   ?  ?  ?  ?  ?  ?  ?  ?  ?   ?  ?  ?  ?  ?  ?  ?  ?  ?  ? ?  ?Prior Functioning/Environment    ?  ?  ?  ?   ? ?Frequency ? Min 2X/week  ? ? ? ? ?  ?Progress Toward Goals ? ?OT Goals(current goals can now be found in the care plan section) ? Progress towards OT goals: Progressing toward goals ? ?Acute Rehab OT Goals ?Patient Stated Goal: get better ?OT Goal Formulation: With patient ?Time For Goal Achievement: 12/08/21 ?Potential to Achieve Goals: Good ?ADL Goals ?Pt Will Perform Upper Body Dressing: with supervision;sitting ?Pt Will Perform Lower Body Dressing: with min assist;sit to/from stand ?Pt Will Transfer to Toilet: with supervision;ambulating;regular height toilet ?Pt Will Perform Tub/Shower Transfer: rolling walker;shower seat;Tub transfer;with min guard assist  ?Plan Discharge plan remains appropriate   ? ?Co-evaluation ? ? ?   ?  ?  ?  ?  ? ?  ?AM-PAC OT "6 Clicks" Daily Activity     ?Outcome Measure ? ? Help from another person eating meals?: None ?Help from another person taking care of personal grooming?: A Little ?Help from another person toileting, which includes using toliet, bedpan, or urinal?: A Little ?Help from another person bathing (including washing, rinsing, drying)?: A Little ?Help from another person to put on and taking off regular upper body clothing?: A Little ?Help from another person to put on and taking off regular lower body clothing?: A Little ?6 Click Score: 19 ? ?  ?End of Session Equipment Utilized During Treatment: Rolling walker (2 wheels);Gait belt ? ?OT Visit Diagnosis: Other abnormalities of gait and mobility (R26.89);Unsteadiness on feet (R26.81);Muscle weakness (generalized) (M62.81) ?  ?Activity Tolerance Patient tolerated treatment well ?  ?Patient Left in chair;with call bell/phone within reach ?  ?Nurse Communication Mobility status ?  ? ?   ? ?Time: [id number] ?OT Time Calculation (min): 32 min ? ?Charges: OT General Charges ?$OT Visit: 1 Visit ?OT Treatments ?$Self Care/Home Management : 23-37  mins ? ?Lodema Hong, OTA ?Acute Rehabilitation Services  ?Pager [phone] ?Office [phone] ? ? ?[place] ?11/26/2021, 9:09 AM ?

## 2021-11-26 NOTE — Progress Notes (Addendum)
?PROGRESS NOTE ? ? ? ?Albert Powell  [id number] DOB: 12/28/1940 DOA: 11/23/2021 ?PCP: Leeroy Cha, MD ? ? ?Brief Narrative:  ?Albert Powell is a 81 y.o. male with medical history significant of afib; CAD; chronic diastolic CHF; COPD; DM; HTN; and HLD presenting with SOB.  In the ED patient found to have hypertensive emergency with volume overload A-fib RVR requiring supplemental oxygen and nitroglycerin drip for blood pressure management. ? ?Assessment & Plan: ?  ?Principal Problem: ?  Acute on chronic diastolic CHF (congestive heart failure) ([place]) ?Active Problems: ?  Type 2 diabetes mellitus ([place]) ?  Hyperlipidemia ?  Tobacco abuse ?  PAD (peripheral artery disease) ([place]) ?  Obesity (BMI 30-39.9)- negative sleep study in the past ?  Uncontrolled hypertension ?  PAF (paroxysmal atrial fibrillation) ([place]) ? ?Acute hypertensive emergency, POA  ?Concurrent acute heart failure exacerbation  ?-Hypertensive emergency at intake resolved, nitro drip off, titrate home medications: ?-Continue carvedilol, Lasix, Entresto, spironolactone ?-Patient diuresing well, not quite euvolemic, creatinine labile but generally stable ?  ?Acute hypoxic respiratory failure, improving ?-Secondary to above, continue to wean off oxygen as tolerated ?-Patient continues to ambulate with profound hypoxia over the past 48 hours, minimally improving, initially desatting into the 70s subsequently in the low 80s today.  Hopeful the next 24 to 48 hours he will be able to ambulate without overt hypoxia and otherwise be stable for discharge. ? ?AKI vs Questionable previously undiagnosed CKD2/3a, uknown stage, POA ?-Baseline creatinine labile - unclear - creatinine 1.2 -2.4 over the past 8 months alone with no clear trend ?-Currently bouncing from 1.6 - 1.9 while inpatient - follow outpatient for further evaluation and treatment ? ?HLD ?-Continue Lipitor ?  ?PAF (paroxysmal atrial fibrillation) ([place]) ?-Transition to carvedilol as above  (previously on metoprolol) ?-Continue Eliquis ?  ?Obesity (BMI 30-39.9)- negative sleep study in the past ?-Body mass index is 33.23 kg/m?[name].  ?-Weight loss should be encouraged ?-Outpatient PCP/bariatric medicine f/u encouraged ?-Hold Ozempic as inpatient ?  ?PAD (peripheral artery disease) ([place]) ?-s/p stents ?  ?Tobacco abuse ?-Lengthy discussion on need for cessation given above ?  ?Type 2 diabetes mellitus ([place]), uncontrolled with hyperglycemia ?-Recent A1c was 7.4, suboptimal control ?-hold glipizide, Farxiga -resume at discharge ?-Cover with moderate-scale SSI, hypoglycemic protocol ? ?DVT prophylaxis: Eliquis ?Code Status: Full ?Family Communication: None available ? ?Status is: Inpatient ? ?Dispo: The patient is from: Home ?             Anticipated d/c is to: Home with HHPT ?             Anticipated d/c date is: 24 to 48 hours ?             Patient currently not medically stable for discharge given ongoing profound hypoxia with exertion ? ?Consultants:  ?None ? ?Procedures:  ?None ? ?Antimicrobials:  ?None ? ?Subjective: ?No acute issues or events overnight denies nausea vomiting diarrhea constipation headache fevers chills or chest pain, shortness of breath ongoing but moderately improving -remains markedly dyspneic/hypoxic with exertion. ? ?Objective: ?Vitals:  ? 11/25/21 1153 11/25/21 1703 11/25/21 2048 11/26/21 0529  ?BP: 126/66 (!) 149/86 (!) 164/78 (!) 165/73  ?Pulse: 81 86 86 82  ?Resp: 18     ?Temp: 98.6 ?F (37 ?C)  98.1 ?F (36.7 ?C) 98.5 ?F (36.9 ?C)  ?TempSrc: Oral  Oral Oral  ?SpO2: 97%  95% 96%  ?Weight:    101.5 kg  ?Height:      ? ? ?Intake/Output  Summary (Last 24 hours) at 11/26/2021 0755 ?Last data filed at 11/26/2021 0530 ?Gross per 24 hour  ?Intake --  ?Output 1050 ml  ?Net -1050 ml  ? ? ?Filed Weights  ? 11/24/21 0535 11/25/21 0431 11/26/21 0529  ?Weight: 103.1 kg 101.4 kg 101.5 kg  ? ? ?Examination: ? ?General:  Pleasantly resting in bed, No acute distress. ?Lungs:  Clear to auscultate  bilaterally without rhonchi, wheeze, or rales. ?Heart:  Regular rate and rhythm.  Without murmurs, rubs, or gallops. ?Abdomen:  Soft, nontender, nondistended.  Without guarding or rebound. ?Extremities: 1+ pitting edema bilateral lower extremities ? ?Data Reviewed: I have personally reviewed following labs and imaging studies ? ?CBC: ?Recent Labs  ?Lab 11/23/21 ?[id number] 11/23/21 ?[id number] 11/24/21 ?0036 11/25/21 ?0736  ?WBC 11.6*  --  10.0 8.6  ?HGB 16.0 16.0 13.7 14.5  ?HCT 48.5 47.0 40.5 43.1  ?MCV 89.3  --  89.4 89.6  ?PLT 284  --  243 248  ? ? ?Basic Metabolic Panel: ?Recent Labs  ?Lab 11/23/21 ?[id number] 11/23/21 ?[id number] 11/24/21 ?0036 11/25/21 ?0736  ?NA 135 137 135 140  ?K 3.4* 3.2* 3.4* 4.1  ?CL 98 98 98 101  ?CO2 26  --  26 30  ?GLUCOSE 203* 199* 151* 145*  ?BUN 18 19 23 23   ?CREATININE 1.71* 1.60* 1.95* 1.79*  ?CALCIUM 9.0  --  8.4* 8.6*  ?MG 2.0  --   --   --   ? ? ?GFR: ?Estimated Creatinine Clearance: 39.3 mL/min (A) (by C-G formula based on SCr of 1.79 mg/dL (H)). ?Liver Function Tests: ?Recent Labs  ?Lab 11/23/21 ?[id number]  ?AST 20  ?ALT 27  ?ALKPHOS 77  ?BILITOT 0.7  ?PROT 6.6  ?ALBUMIN 3.1*  ? ? ?No results for input(s): LIPASE, AMYLASE in the last 168 hours. ?No results for input(s): AMMONIA in the last 168 hours. ?Coagulation Profile: ?No results for input(s): INR, PROTIME in the last 168 hours. ?Cardiac Enzymes: ?No results for input(s): CKTOTAL, CKMB, CKMBINDEX, TROPONINI in the last 168 hours. ?BNP (last 3 results) ?No results for input(s): PROBNP in the last 8760 hours. ?HbA1C: ?No results for input(s): HGBA1C in the last 72 hours. ?CBG: ?Recent Labs  ?Lab 11/25/21 ?0727 11/25/21 ?1150 11/25/21 ?1644 11/25/21 ?2138 11/26/21 ?0734  ?GLUCAP 128* 150* 196* 117* 172*  ? ? ?Lipid Profile: ?No results for input(s): CHOL, HDL, LDLCALC, TRIG, CHOLHDL, LDLDIRECT in the last 72 hours. ?Thyroid Function Tests: ?No results for input(s): TSH, T4TOTAL, FREET4, T3FREE, THYROIDAB in the last 72 hours. ?Anemia Panel: ?No  results for input(s): VITAMINB12, FOLATE, FERRITIN, TIBC, IRON, RETICCTPCT in the last 72 hours. ?Sepsis Labs: ?No results for input(s): PROCALCITON, LATICACIDVEN in the last 168 hours. ? ?Recent Results (from the past 240 hour(s))  ?Resp Panel by RT-PCR (Flu A&B, Covid) Nasopharyngeal Swab     Status: None  ? Collection Time: 11/23/21  6:12 AM  ? Specimen: Nasopharyngeal Swab; Nasopharyngeal(NP) swabs in vial transport medium  ?Result Value Ref Range Status  ? SARS Coronavirus 2 by RT PCR NEGATIVE NEGATIVE Final  ?  Comment: (NOTE) ?SARS-CoV-2 target nucleic acids are NOT DETECTED. ? ?The SARS-CoV-2 RNA is generally detectable in upper respiratory ?specimens during the acute phase of infection. The lowest ?concentration of SARS-CoV-2 viral copies this assay can detect is ?138 copies/mL. A negative result does not preclude SARS-Cov-2 ?infection and should not be used as the sole basis for treatment or ?other patient management decisions. A negative result may occur with  ?improper specimen collection/handling, submission  of specimen other ?than nasopharyngeal swab, presence of viral mutation(s) within the ?areas targeted by this assay, and inadequate number of viral ?copies(<138 copies/mL). A negative result must be combined with ?clinical observations, patient history, and epidemiological ?information. The expected result is Negative. ? ?Fact Sheet for Patients:  ?EntrepreneurPulse.com.au ? ?Fact Sheet for Healthcare Providers:  ?IncredibleEmployment.be ? ?This test is no t yet approved or cleared by the [place] FDA and  ?has been authorized for detection and/or diagnosis of SARS-CoV-2 by ?FDA under an Emergency Use Authorization (EUA). This EUA will remain  ?in effect (meaning this test can be used) for the duration of the ?COVID-19 declaration under Section 564(b)(1) of the Act, 21 ?U.S.C.section 360bbb-3(b)(1), unless the authorization is terminated  ?or revoked sooner.   ? ? ?  ? Influenza A by PCR NEGATIVE NEGATIVE Final  ? Influenza B by PCR NEGATIVE NEGATIVE Final  ?  Comment: (NOTE) ?The Xpert Xpress SARS-CoV-2/FLU/RSV plus assay is intended as an aid ?in the diagnosis of i

## 2021-11-26 NOTE — TOC Benefit Eligibility Note (Addendum)
Patient Advocate Encounter ? ?Insurance verification completed.   ? ?The patient is currently admitted and upon discharge could be taking Jardiance 10 mg. ? ?The current 30 day co-pay is, $47.00.  ? ?The patient is insured through [employer] Medicare Part D  ? ? ? ?Roland Earl, CPhT ?Pharmacy Patient Advocate Specialist ?[facility] Patient Advocate Team ?Direct Number: [phone]  Fax: [phone] ? ? ? ? ? ?  ?

## 2021-11-26 NOTE — Progress Notes (Signed)
Heart Failure Nurse Navigator Progress Note  ? ?Patient was seen , discussion about his readmission. Patient states " he maintains a low sodium diet, takes his medications as prescribed, is able to ambulate, he does still drive and his able to get to his appointments. Last EF was 50-55%, G1DD, BP's still on the higher side, initially on NTG gtt for BP control.  ? ?Patient previously seen in [facility] clinic in December 2022 after hospitalization for HF related symptoms in November. Patient scheduled for a HVTOC appointment on 12/09/21 @ 12noon. Prior to admission pt has a scheduled appointment with Dr. Allyson Sabal on 12/31/21.  ? ?Patient reeducated on the importance of keeping his appointments, taking his medications as prescribed and watching his dietary intake.  ? ?Rhae Hammock, BSN, RN ?Heart Failure Nurse Navigator ?[phone]  ?

## 2021-11-27 DIAGNOSIS — E669 Obesity, unspecified: Secondary | ICD-10-CM

## 2021-11-27 DIAGNOSIS — Z720 Tobacco use: Secondary | ICD-10-CM

## 2021-11-27 DIAGNOSIS — I480 Paroxysmal atrial fibrillation: Secondary | ICD-10-CM

## 2021-11-27 DIAGNOSIS — E782 Mixed hyperlipidemia: Secondary | ICD-10-CM

## 2021-11-27 DIAGNOSIS — I739 Peripheral vascular disease, unspecified: Secondary | ICD-10-CM

## 2021-11-27 LAB — CBC
HCT: 38.7 % — ABNORMAL LOW (ref 39.0–52.0)
Hemoglobin: 13.2 g/dL (ref 13.0–17.0)
MCH: 29.9 pg (ref 26.0–34.0)
MCHC: 34.1 g/dL (ref 30.0–36.0)
MCV: 87.8 fL (ref 80.0–100.0)
Platelets: 221 10*3/uL (ref 150–400)
RBC: 4.41 MIL/uL (ref 4.22–5.81)
RDW: 13.8 % (ref 11.5–15.5)
WBC: 7.4 10*3/uL (ref 4.0–10.5)
nRBC: 0 % (ref 0.0–0.2)

## 2021-11-27 LAB — BASIC METABOLIC PANEL
Anion gap: 9 (ref 5–15)
BUN: 26 mg/dL — ABNORMAL HIGH (ref 8–23)
CO2: 25 mmol/L (ref 22–32)
Calcium: 8 mg/dL — ABNORMAL LOW (ref 8.9–10.3)
Chloride: 99 mmol/L (ref 98–111)
Creatinine, Ser: 1.59 mg/dL — ABNORMAL HIGH (ref 0.61–1.24)
GFR, Estimated: 44 mL/min — ABNORMAL LOW (ref >60)
Glucose, Bld: 283 mg/dL — ABNORMAL HIGH (ref 70–99)
Potassium: 3 mmol/L — ABNORMAL LOW (ref 3.5–5.1)
Sodium: 133 mmol/L — ABNORMAL LOW (ref 135–145)

## 2021-11-27 LAB — GLUCOSE, CAPILLARY: Glucose-Capillary: 156 mg/dL — ABNORMAL HIGH (ref 70–99)

## 2021-11-27 MED ORDER — FUROSEMIDE 40 MG PO TABS: 40.0000 mg | ORAL_TABLET | Freq: Every day | ORAL | 0 refills | Status: DC

## 2021-11-27 MED ORDER — POTASSIUM CHLORIDE CRYS ER 20 MEQ PO TBCR
40.0000 meq | EXTENDED_RELEASE_TABLET | Freq: Once | ORAL | Status: AC
Start: 2021-11-27 — End: 2021-11-27
  Administered 2021-11-27: 40 meq via ORAL
  Filled 2021-11-27: qty 2

## 2021-11-27 NOTE — Progress Notes (Signed)
Occupational Therapy Treatment ?Patient Details ?Name: Albert Powell ?MRN: [id number] ?DOB: September 03, 1941 ?Today's Date: 11/27/2021 ? ? ?History of present illness Pt is a 81 y.o. M who presents 11/23/2021 with SOB. Admitted with acute on chronic diastolic CHF. CXR consistent with pulmonary edema. Significant PMH:  HTN, HLD, PAF, obesity, afib, CAD, COPD. ?  ?OT comments ? Pt progressing towards goals this session, able to complete LB/UB dressing and standing grooming task at sink with supervision-min A, supervision for bed mobility and transfers with RW. Pt VSS on 3L O2 throughout session, mild fatigue after standing ADL after ~16min. Pt presenting with impairments listed below, will follow acutely. Continue to recommend HHOT at d/c.  ? ?Recommendations for follow up therapy are one component of a multi-disciplinary discharge planning process, led by the attending physician.  Recommendations may be updated based on patient status, additional functional criteria and insurance authorization. ?   ?Follow Up Recommendations ? Home health OT  ?  ?Assistance Recommended at Discharge Set up Supervision/Assistance  ?Patient can return home with the following ? A little help with walking and/or transfers;A little help with bathing/dressing/bathroom;Assistance with cooking/housework ?  ?Equipment Recommendations ? BSC/3in1  ?  ?Recommendations for Other Services   ? ?  ?Precautions / Restrictions Precautions ?Precautions: Fall ?Precaution Comments: watch O2 ?Restrictions ?Weight Bearing Restrictions: No  ? ? ?  ? ?Mobility Bed Mobility ?Overal bed mobility: Needs Assistance ?Bed Mobility: Supine to Sit ?  ?  ?Supine to sit: Supervision ?  ?  ?  ?  ? ?Transfers ?Overall transfer level: Needs assistance ?Equipment used: Rolling walker (2 wheels) ?Transfers: Sit to/from Stand ?Sit to Stand: Supervision ?  ?  ?  ?  ?  ?  ?  ?  ?Balance Overall balance assessment: Needs assistance ?Sitting-balance support: No upper extremity  supported, Feet supported ?Sitting balance-Leahy Scale: Good ?Sitting balance - Comments: EOB without assist ?  ?Standing balance support: Bilateral upper extremity supported, No upper extremity supported, During functional activity ?Standing balance-Leahy Scale: Poor ?Standing balance comment: reliance on external support with static standing and ambulation ?  ?  ?  ?  ?  ?  ?  ?  ?  ?  ?  ?   ? ?ADL either performed or assessed with clinical judgement  ? ?ADL Overall ADL's : Needs assistance/impaired ?  ?  ?Grooming: Wash/dry hands;Wash/dry face;Oral care;Brushing hair;Standing;Min guard ?Grooming Details (indicate cue type and reason): stood at sink ?  ?  ?  ?  ?Upper Body Dressing : Minimal assistance;Sitting ?Upper Body Dressing Details (indicate cue type and reason): min A for wires/IV mgmt ?Lower Body Dressing: Supervision/safety ?Lower Body Dressing Details (indicate cue type and reason): to don pants/shoes ?Toilet Transfer: Min guard;Rolling walker (2 wheels);Regular Toilet;Ambulation ?Toilet Transfer Details (indicate cue type and reason): simulated in room ?  ?  ?  ?  ?Functional mobility during ADLs: Min guard;Rolling walker (2 wheels) ?  ?  ? ?Extremity/Trunk Assessment Upper Extremity Assessment ?Upper Extremity Assessment: Overall WFL for tasks assessed ?  ?Lower Extremity Assessment ?Lower Extremity Assessment: Defer to PT evaluation ?  ?  ?  ? ?Vision   ?Vision Assessment?: No apparent visual deficits ?  ?Perception Perception ?Perception: Not tested ?  ?Praxis Praxis ?Praxis: Not tested ?  ? ?Cognition Arousal/Alertness: Awake/alert ?Behavior During Therapy: [facility] for tasks assessed/performed ?Overall Cognitive Status: Within Functional Limits for tasks assessed ?  ?  ?  ?  ?  ?  ?  ?  ?  ?  ?  ?  ?  ?  ?  ?  ?  ?  ?  ?   ?  Exercises   ? ?  ?Shoulder Instructions   ? ? ?  ?General Comments SpO2 100% on 3LO2  ? ? ?Pertinent Vitals/ Pain       Pain Assessment ?Pain Assessment: No/denies pain ? ?Home  Living   ?  ?  ?  ?  ?  ?  ?  ?  ?  ?  ?  ?  ?  ?  ?  ?  ?  ?  ? ?  ?Prior Functioning/Environment    ?  ?  ?  ?   ? ?Frequency ? Min 2X/week  ? ? ? ? ?  ?Progress Toward Goals ? ?OT Goals(current goals can now be found in the care plan section) ? Progress towards OT goals: Progressing toward goals ? ?Acute Rehab OT Goals ?Patient Stated Goal: to go home ?OT Goal Formulation: With patient ?Time For Goal Achievement: 12/08/21 ?Potential to Achieve Goals: Good ?ADL Goals ?Pt Will Perform Upper Body Dressing: with supervision;sitting ?Pt Will Perform Lower Body Dressing: with min assist;sit to/from stand ?Pt Will Transfer to Toilet: with supervision;ambulating;regular height toilet ?Pt Will Perform Tub/Shower Transfer: rolling walker;shower seat;Tub transfer;with min guard assist  ?Plan Discharge plan remains appropriate   ? ?Co-evaluation ? ? ?   ?  ?  ?  ?  ? ?  ?AM-PAC OT "6 Clicks" Daily Activity     ?Outcome Measure ? ? Help from another person eating meals?: None ?Help from another person taking care of personal grooming?: A Little ?Help from another person toileting, which includes using toliet, bedpan, or urinal?: A Little ?Help from another person bathing (including washing, rinsing, drying)?: A Little ?Help from another person to put on and taking off regular upper body clothing?: A Little ?Help from another person to put on and taking off regular lower body clothing?: A Little ?6 Click Score: 19 ? ?  ?End of Session Equipment Utilized During Treatment: Rolling walker (2 wheels);Gait belt ? ?OT Visit Diagnosis: Other abnormalities of gait and mobility (R26.89);Unsteadiness on feet (R26.81);Muscle weakness (generalized) (M62.81) ?  ?Activity Tolerance Patient tolerated treatment well ?  ?Patient Left with call bell/phone within reach;in bed;with bed alarm set ?  ?Nurse Communication Mobility status ?  ? ?   ? ?Time: [id number] ?OT Time Calculation (min): 20 min ? ?Charges: OT General Charges ?$OT Visit: 1  Visit ?OT Treatments ?$Self Care/Home Management : 8-22 mins ? ?Albert Powell, OTD, OTR/L ?Acute Rehab ?[phone] ? ? ?Albert Powell ?11/27/2021, 10:55 AM ?

## 2021-11-27 NOTE — Progress Notes (Signed)
Mobility Specialist Progress Note  ? ? 11/27/21 1128  ?Mobility  ?Activity Ambulated with assistance in hallway  ?Level of Assistance Contact guard assist, steadying assist  ?Assistive Device Front wheel walker  ?Distance Ambulated (ft) 260 ft  ?Activity Response Tolerated fair  ?$Mobility charge 1 Mobility  ? ?Pt received in bed and agreeable. No complaints. Took x1 short standing rest break. Returned to sitting EOB with call bell in reach.  ? ?[place] ?Mobility Specialist  ?[address]: [phone]  ?

## 2021-11-27 NOTE — Plan of Care (Signed)
?  Problem: Education: ?Goal: Ability to demonstrate management of disease process will improve ?Outcome: Adequate for Discharge ?Goal: Ability to verbalize understanding of medication therapies will improve ?Outcome: Adequate for Discharge ?Goal: Individualized Educational Video(s) ?Outcome: Adequate for Discharge ?  ?Problem: Cardiac: ?Goal: Ability to achieve and maintain adequate cardiopulmonary perfusion will improve ?Outcome: Adequate for Discharge ?  ?Problem: Education: ?Goal: Knowledge of General Education information will improve ?Description: Including pain rating scale, medication(s)/side effects and non-pharmacologic comfort measures ?Outcome: Adequate for Discharge ?  ?Problem: Health Behavior/Discharge Planning: ?Goal: Ability to manage health-related needs will improve ?Outcome: Adequate for Discharge ?  ?Problem: Clinical Measurements: ?Goal: Ability to maintain clinical measurements within normal limits will improve ?Outcome: Adequate for Discharge ?Goal: Diagnostic test results will improve ?Outcome: Adequate for Discharge ?Goal: Cardiovascular complication will be avoided ?Outcome: Adequate for Discharge ?  ?Problem: Nutrition: ?Goal: Adequate nutrition will be maintained ?Outcome: Adequate for Discharge ?  ?Problem: Elimination: ?Goal: Will not experience complications related to urinary retention ?Outcome: Adequate for Discharge ?  ?Problem: Pain Managment: ?Goal: General experience of comfort will improve ?Outcome: Adequate for Discharge ?  ?Problem: Safety: ?Goal: Ability to remain free from injury will improve ?Outcome: Adequate for Discharge ?  ?Problem: Skin Integrity: ?Goal: Risk for impaired skin integrity will decrease ?Outcome: Adequate for Discharge ?  ?Problem: Education: ?Goal: Knowledge of disease or condition will improve ?Outcome: Adequate for Discharge ?Goal: Understanding of medication regimen will improve ?Outcome: Adequate for Discharge ?Goal: Individualized Educational  Video(s) ?Outcome: Adequate for Discharge ?  ?Problem: Activity: ?Goal: Ability to tolerate increased activity will improve ?Outcome: Adequate for Discharge ?  ?Problem: Cardiac: ?Goal: Ability to achieve and maintain adequate cardiopulmonary perfusion will improve ?Outcome: Adequate for Discharge ?  ?Problem: Health Behavior/Discharge Planning: ?Goal: Ability to safely manage health-related needs after discharge will improve ?Outcome: Adequate for Discharge ?  ?

## 2021-11-27 NOTE — Care Management Important Message (Signed)
Important Message ? ?Patient Details  ?Name: Albert Powell ?MRN: [id number] ?Date of Birth: 11-25-1940 ? ? ?Medicare Important Message Given:  Yes ? ? ? ? ?[name] ?11/27/2021, 7:53 AM ?

## 2021-11-27 NOTE — Discharge Summary (Signed)
?Physician Discharge Summary ?  ?Patient: Albert Powell MRN: [id number] DOB: Jan 23, 1941  ?Admit date:     11/23/2021  ?Discharge date: 11/27/2021   ?Discharge Physician: Patrecia Pour  ? ?PCP: Leeroy Cha, MD  ? ?Recommendations at discharge:  ? HF clinic follow up. Recheck BMP, monitor BP and volume status. ? ?Discharge Diagnoses: ?Principal Problem: ?  Acute on chronic diastolic CHF (congestive heart failure) ([place]) ?Active Problems: ?  Type 2 diabetes mellitus ([place]) ?  Hyperlipidemia ?  Tobacco abuse ?  PAD (peripheral artery disease) ([place]) ?  Obesity (BMI 30-39.9)- negative sleep study in the past ?  Uncontrolled hypertension ?  PAF (paroxysmal atrial fibrillation) ([place]) ? ? ?Hospital Course: ?Albert Powell is a 81 y.o. male with medical history significant of afib; CAD; chronic diastolic CHF; COPD; DM; HTN; and HLD presenting with SOB.  In the ED patient found to have hypertensive emergency with volume overload A-fib RVR requiring supplemental oxygen and nitroglycerin drip for blood pressure management. ? ?Echo: LVEF 50-55%, global hypokinesis, G1DD, elevated LVEDP, normal RV. With diuresis, he is no longer requiring supplemental oxygen and can be safely discharged.  ? ?- Continue lasix 40mg  once daily ?- Continue metoprolol, imdur, entresto, and spironolactone. ?- Continue taking eliquis   ?- Follow up with the heart failure clinic as scheduled. Daily weights recommended. ? ?Assessment and Plan: ?Acute hypertensive emergency, POA  ?Concurrent acute heart failure exacerbation  ?-Hypertensive emergency at intake resolved, nitro drip off. ?- Better BP once euvolemic, continue home BP medications  ?  ?Acute hypoxic respiratory failure, resolved  ?  ?AKI on stage IIIa CKD: ?- Recheck at follow up. ?  ?HLD ?-Continue Lipitor ?  ?PAF (paroxysmal atrial fibrillation) ([place]) ?- Continue BB ?- Continue Eliquis ?  ?Obesity (BMI 30-39.9)- negative sleep study in the past ?-Body mass index is 33.23 kg/m?[name].   ?-Weight loss should be encouraged ?-Outpatient PCP/bariatric medicine f/u encouraged ?  ?PAD (peripheral artery disease) ([place]) ?-s/p stents ?  ?Tobacco abuse ?-Lengthy discussion on need for cessation given above ?  ?Type 2 diabetes mellitus ([place]), uncontrolled with hyperglycemia ?-Recent A1c was 7.4, suboptimal control ?-hold glipizide, Farxiga -resume at discharge ?-Cover with moderate-scale SSI, hypoglycemic protocol ?Consultants: None ?Procedures performed: None  ?Disposition: Home ?Diet recommendation:  ?Cardiac diet ?DISCHARGE MEDICATION: ?Allergies as of 11/27/2021   ? ?   Reactions  ? Fentanyl Other (See Comments)  ? Behavioral changes  ? Gabapentin Other (See Comments)  ? ankle swells  ? Lisinopril Other (See Comments)  ? unknown  ? Lyrica [pregabalin] Other (See Comments)  ? unknown  ? Metformin Diarrhea  ? Propofol Other (See Comments)  ? Heart rate dropped  ? ?  ? ?  ?Medication List  ?  ? ?TAKE these medications   ? ?apixaban 5 MG Tabs tablet ?Commonly known as: ELIQUIS ?Take 1 tablet (5 mg total) by mouth 2 (two) times daily. ?  ?atorvastatin 40 MG tablet ?Commonly known as: LIPITOR ?Take 40 mg by mouth daily. ?  ?clopidogrel 75 MG tablet ?Commonly known as: PLAVIX ?Take 1 tablet (75 mg total) by mouth daily. ?  ?dapagliflozin propanediol 10 MG Tabs tablet ?Commonly known as: [place] ?Take 1 tablet (10 mg total) by mouth daily before breakfast. ?  ?Entresto 49-51 MG ?Generic drug: sacubitril-valsartan ?Take 1 tablet by mouth 2 (two) times daily. ?What changed: Another medication with the same name was removed. Continue taking this medication, and follow the directions you see here. ?  ?escitalopram 20  MG tablet ?Commonly known as: LEXAPRO ?Take 20 mg by mouth daily. ?  ?furosemide 20 MG tablet ?Commonly known as: LASIX ?Take 20 mg by mouth daily as needed for fluid or edema. ?  ?furosemide 40 MG tablet ?Commonly known as: LASIX ?Take 1 tablet (40 mg total) by mouth daily. ?  ?glipiZIDE 5 MG  tablet ?Commonly known as: GLUCOTROL ?Take 5 mg by mouth daily before breakfast. ?  ?isosorbide mononitrate 60 MG 24 hr tablet ?Commonly known as: IMDUR ?Take 2 tablets (120 mg total) by mouth daily. ?What changed: how much to take ?  ?levothyroxine 25 MCG tablet ?Commonly known as: SYNTHROID ?Take 25 mcg by mouth every morning. ?  ?metoprolol tartrate 25 MG tablet ?Commonly known as: LOPRESSOR ?Take 0.5 tablets (12.5 mg total) by mouth 2 (two) times daily. ?  ?Ozempic (0.25 or 0.5 MG/DOSE) 2 MG/1.5ML Sopn ?Generic drug: Semaglutide(0.25 or 0.5MG /DOS) ?Inject 0.5 mg into the skin once a week. ?  ?pantoprazole 40 MG tablet ?Commonly known as: PROTONIX ?Take 40 mg by mouth every other day. ?  ?spironolactone 25 MG tablet ?Commonly known as: ALDACTONE ?Take 1/2 tablet (12.5 mg total) by mouth daily. ?  ?SYSTANE FREE OP ?Place 1 drop into both eyes daily as needed (dry eyes). ?  ? ?  ? ? Follow-up Information   ? ? [facility]. Go to.   ?Specialty: Cardiology ?Why: Please follow up with the [facility] clinic in the Heart and Vascular center at [facility] on 12/09/2021 @ 12 noon.  ? ?[address] , turn in [place] parking.  ? ?Please bring all your medications with you for this appointment. ?Contact information: ?[address] ?[id number] mc ?[place] ?[phone] ? ?  ?  ? ?  ?  ? ?  ? ?Discharge Exam: ?Filed Weights  ? 11/25/21 0431 11/26/21 0529 11/27/21 0605  ?Weight: 101.4 kg 101.5 kg 102.2 kg  ?No distress, no edema, no crackles ? ?Condition at discharge: stable ? ?The results of significant diagnostics from this hospitalization (including imaging, microbiology, ancillary and laboratory) are listed below for reference.  ? ?Imaging Studies: ?DG Chest Port 1 View ? ?Result Date: 11/23/2021 ?CLINICAL DATA:  Shortness of breath with atrial fibrillation with RVR. EXAM: PORTABLE CHEST 1 VIEW COMPARISON:  AP Lat  08/06/2021 FINDINGS: Moderate cardiomegaly is again noted. There is perihilar vascular congestion and mild interstitial edema of the central and lower zonal areas of both lungs, with small pleural effusions. There is patchy hazy opacity in the lower lung fields which could be atelectasis, pneumonia, ground-glass edema or combination. The mid/upper lung fields are clear of focal infiltrates with COPD. There is aortic atherosclerosis.  Stable mediastinum. Neurostimulator wiring terminates in the midthoracic spinal canal. IMPRESSION: 1. Perihilar vascular congestion with central and lower zonal interstitial edema consistent with CHF or fluid overload. 2. Small pleural effusions, with patchy hazy opacities in the lower lung fields, which could be ground-glass edema, atelectasis, pneumonia or combination. 3. Clinical correlation and radiographic follow-up recommended. 4. Aortic atherosclerosis. Electronically Signed   By: Telford Nab M.D.   On: 11/23/2021 06:32  ? ?ECHOCARDIOGRAM COMPLETE ? ?Result Date: 11/25/2021 ?   ECHOCARDIOGRAM REPORT   Patient Name:   FENDER STANSBERRY Date of Exam: 11/25/2021 Medical Rec #:  [id number]        Height:       69.0 in Accession #:    [id number]  Weight:       223.5 lb Date of Birth:  02/13/1941        BSA:          2.166 m? Patient Age:    20 years         BP:           133/70 mmHg Patient Gender: M                HR:           83 bpm. Exam Location:  Inpatient Procedure: 2D Echo, Cardiac Doppler and Color Doppler Indications:    CHF  History:        Patient has prior history of Echocardiogram examinations, most                 recent 06/07/2021. CHF, CAD, COPD, Arrythmias:Atrial Fibrillation                 and Bradycardia; Risk Factors:Diabetes and Dyslipidemia.  Sonographer:    Luisa Hart RDCS Referring Phys: [place]  1. Left ventricular ejection fraction, by estimation, is 50 to 55%. The left ventricle has low normal function. The left ventricle  demonstrates global hypokinesis. There is moderate concentric left ventricular hypertrophy. Left ventricular diastolic parameters are consistent with Grade I diastolic dysfunction (impaired relaxation). Elevated left ventricu

## 2021-11-28 DIAGNOSIS — I5033 Acute on chronic diastolic (congestive) heart failure: Secondary | ICD-10-CM | POA: Diagnosis not present

## 2021-11-28 DIAGNOSIS — J9601 Acute respiratory failure with hypoxia: Secondary | ICD-10-CM | POA: Diagnosis not present

## 2021-11-28 DIAGNOSIS — E1151 Type 2 diabetes mellitus with diabetic peripheral angiopathy without gangrene: Secondary | ICD-10-CM | POA: Diagnosis not present

## 2021-11-28 DIAGNOSIS — Z7902 Long term (current) use of antithrombotics/antiplatelets: Secondary | ICD-10-CM | POA: Diagnosis not present

## 2021-11-28 DIAGNOSIS — E1165 Type 2 diabetes mellitus with hyperglycemia: Secondary | ICD-10-CM | POA: Diagnosis not present

## 2021-11-28 DIAGNOSIS — E1136 Type 2 diabetes mellitus with diabetic cataract: Secondary | ICD-10-CM | POA: Diagnosis not present

## 2021-11-28 DIAGNOSIS — Z9181 History of falling: Secondary | ICD-10-CM | POA: Diagnosis not present

## 2021-11-28 DIAGNOSIS — F17210 Nicotine dependence, cigarettes, uncomplicated: Secondary | ICD-10-CM | POA: Diagnosis not present

## 2021-11-28 DIAGNOSIS — I480 Paroxysmal atrial fibrillation: Secondary | ICD-10-CM | POA: Diagnosis not present

## 2021-11-28 DIAGNOSIS — E785 Hyperlipidemia, unspecified: Secondary | ICD-10-CM | POA: Diagnosis not present

## 2021-11-28 DIAGNOSIS — K219 Gastro-esophageal reflux disease without esophagitis: Secondary | ICD-10-CM | POA: Diagnosis not present

## 2021-11-28 DIAGNOSIS — E669 Obesity, unspecified: Secondary | ICD-10-CM | POA: Diagnosis not present

## 2021-11-28 DIAGNOSIS — Z6833 Body mass index (BMI) 33.0-33.9, adult: Secondary | ICD-10-CM | POA: Diagnosis not present

## 2021-11-28 DIAGNOSIS — I110 Hypertensive heart disease with heart failure: Secondary | ICD-10-CM | POA: Diagnosis not present

## 2021-11-28 DIAGNOSIS — Z7984 Long term (current) use of oral hypoglycemic drugs: Secondary | ICD-10-CM | POA: Diagnosis not present

## 2021-11-28 DIAGNOSIS — Z9981 Dependence on supplemental oxygen: Secondary | ICD-10-CM | POA: Diagnosis not present

## 2021-11-28 DIAGNOSIS — Z7985 Long-term (current) use of injectable non-insulin antidiabetic drugs: Secondary | ICD-10-CM | POA: Diagnosis not present

## 2021-11-28 DIAGNOSIS — I2510 Atherosclerotic heart disease of native coronary artery without angina pectoris: Secondary | ICD-10-CM | POA: Diagnosis not present

## 2021-11-28 DIAGNOSIS — M519 Unspecified thoracic, thoracolumbar and lumbosacral intervertebral disc disorder: Secondary | ICD-10-CM | POA: Diagnosis not present

## 2021-11-28 DIAGNOSIS — J449 Chronic obstructive pulmonary disease, unspecified: Secondary | ICD-10-CM | POA: Diagnosis not present

## 2021-11-28 DIAGNOSIS — Z7901 Long term (current) use of anticoagulants: Secondary | ICD-10-CM | POA: Diagnosis not present

## 2021-12-03 DIAGNOSIS — I480 Paroxysmal atrial fibrillation: Secondary | ICD-10-CM | POA: Diagnosis not present

## 2021-12-03 DIAGNOSIS — J9601 Acute respiratory failure with hypoxia: Secondary | ICD-10-CM | POA: Diagnosis not present

## 2021-12-03 DIAGNOSIS — I110 Hypertensive heart disease with heart failure: Secondary | ICD-10-CM | POA: Diagnosis not present

## 2021-12-03 DIAGNOSIS — J449 Chronic obstructive pulmonary disease, unspecified: Secondary | ICD-10-CM | POA: Diagnosis not present

## 2021-12-03 DIAGNOSIS — I5033 Acute on chronic diastolic (congestive) heart failure: Secondary | ICD-10-CM | POA: Diagnosis not present

## 2021-12-03 DIAGNOSIS — E785 Hyperlipidemia, unspecified: Secondary | ICD-10-CM | POA: Diagnosis not present

## 2021-12-04 ENCOUNTER — Other Ambulatory Visit (HOSPITAL_COMMUNITY): Payer: Self-pay | Admitting: Cardiovascular Disease

## 2021-12-04 DIAGNOSIS — I6523 Occlusion and stenosis of bilateral carotid arteries: Secondary | ICD-10-CM

## 2021-12-08 DIAGNOSIS — E785 Hyperlipidemia, unspecified: Secondary | ICD-10-CM | POA: Diagnosis not present

## 2021-12-08 DIAGNOSIS — J449 Chronic obstructive pulmonary disease, unspecified: Secondary | ICD-10-CM | POA: Diagnosis not present

## 2021-12-08 DIAGNOSIS — I5033 Acute on chronic diastolic (congestive) heart failure: Secondary | ICD-10-CM | POA: Diagnosis not present

## 2021-12-08 DIAGNOSIS — I110 Hypertensive heart disease with heart failure: Secondary | ICD-10-CM | POA: Diagnosis not present

## 2021-12-08 DIAGNOSIS — J9601 Acute respiratory failure with hypoxia: Secondary | ICD-10-CM | POA: Diagnosis not present

## 2021-12-08 DIAGNOSIS — I480 Paroxysmal atrial fibrillation: Secondary | ICD-10-CM | POA: Diagnosis not present

## 2021-12-09 ENCOUNTER — Other Ambulatory Visit: Payer: Self-pay

## 2021-12-09 ENCOUNTER — Ambulatory Visit (HOSPITAL_COMMUNITY)
Admit: 2021-12-09 | Discharge: 2021-12-09 | Disposition: A | Discharge status: Home / Self Care | Payer: Medicare Other | Attending: Cardiology | Admitting: Cardiology

## 2021-12-09 ENCOUNTER — Encounter (HOSPITAL_COMMUNITY): Payer: Self-pay

## 2021-12-09 ENCOUNTER — Telehealth (HOSPITAL_COMMUNITY): Payer: Self-pay | Admitting: *Deleted

## 2021-12-09 VITALS — BP 135/82 | HR 70 | Wt 229.4 lb

## 2021-12-09 DIAGNOSIS — I480 Paroxysmal atrial fibrillation: Secondary | ICD-10-CM | POA: Diagnosis not present

## 2021-12-09 DIAGNOSIS — E1151 Type 2 diabetes mellitus with diabetic peripheral angiopathy without gangrene: Secondary | ICD-10-CM | POA: Insufficient documentation

## 2021-12-09 DIAGNOSIS — I440 Atrioventricular block, first degree: Secondary | ICD-10-CM | POA: Insufficient documentation

## 2021-12-09 DIAGNOSIS — Z7901 Long term (current) use of anticoagulants: Secondary | ICD-10-CM | POA: Diagnosis not present

## 2021-12-09 DIAGNOSIS — Z833 Family history of diabetes mellitus: Secondary | ICD-10-CM | POA: Diagnosis not present

## 2021-12-09 DIAGNOSIS — I5032 Chronic diastolic (congestive) heart failure: Secondary | ICD-10-CM | POA: Diagnosis not present

## 2021-12-09 DIAGNOSIS — Z955 Presence of coronary angioplasty implant and graft: Secondary | ICD-10-CM | POA: Diagnosis not present

## 2021-12-09 DIAGNOSIS — Z79899 Other long term (current) drug therapy: Secondary | ICD-10-CM | POA: Diagnosis not present

## 2021-12-09 DIAGNOSIS — I110 Hypertensive heart disease with heart failure: Secondary | ICD-10-CM | POA: Diagnosis not present

## 2021-12-09 DIAGNOSIS — Z8349 Family history of other endocrine, nutritional and metabolic diseases: Secondary | ICD-10-CM | POA: Insufficient documentation

## 2021-12-09 DIAGNOSIS — E039 Hypothyroidism, unspecified: Secondary | ICD-10-CM | POA: Insufficient documentation

## 2021-12-09 DIAGNOSIS — I739 Peripheral vascular disease, unspecified: Secondary | ICD-10-CM | POA: Diagnosis not present

## 2021-12-09 DIAGNOSIS — Z8249 Family history of ischemic heart disease and other diseases of the circulatory system: Secondary | ICD-10-CM | POA: Diagnosis not present

## 2021-12-09 DIAGNOSIS — I498 Other specified cardiac arrhythmias: Secondary | ICD-10-CM | POA: Insufficient documentation

## 2021-12-09 DIAGNOSIS — I701 Atherosclerosis of renal artery: Secondary | ICD-10-CM | POA: Diagnosis not present

## 2021-12-09 DIAGNOSIS — Z7984 Long term (current) use of oral hypoglycemic drugs: Secondary | ICD-10-CM | POA: Diagnosis not present

## 2021-12-09 DIAGNOSIS — I160 Hypertensive urgency: Secondary | ICD-10-CM | POA: Diagnosis not present

## 2021-12-09 DIAGNOSIS — I2510 Atherosclerotic heart disease of native coronary artery without angina pectoris: Secondary | ICD-10-CM | POA: Diagnosis not present

## 2021-12-09 DIAGNOSIS — E669 Obesity, unspecified: Secondary | ICD-10-CM | POA: Insufficient documentation

## 2021-12-09 DIAGNOSIS — R569 Unspecified convulsions: Secondary | ICD-10-CM | POA: Insufficient documentation

## 2021-12-09 DIAGNOSIS — I5033 Acute on chronic diastolic (congestive) heart failure: Secondary | ICD-10-CM

## 2021-12-09 DIAGNOSIS — I482 Chronic atrial fibrillation, unspecified: Secondary | ICD-10-CM | POA: Diagnosis not present

## 2021-12-09 DIAGNOSIS — Z79890 Hormone replacement therapy (postmenopausal): Secondary | ICD-10-CM | POA: Insufficient documentation

## 2021-12-09 DIAGNOSIS — E1136 Type 2 diabetes mellitus with diabetic cataract: Secondary | ICD-10-CM | POA: Insufficient documentation

## 2021-12-09 DIAGNOSIS — I6522 Occlusion and stenosis of left carotid artery: Secondary | ICD-10-CM | POA: Diagnosis not present

## 2021-12-09 DIAGNOSIS — E785 Hyperlipidemia, unspecified: Secondary | ICD-10-CM | POA: Insufficient documentation

## 2021-12-09 DIAGNOSIS — R9431 Abnormal electrocardiogram [ECG] [EKG]: Secondary | ICD-10-CM | POA: Insufficient documentation

## 2021-12-09 LAB — BASIC METABOLIC PANEL
Anion gap: 7 (ref 5–15)
BUN: 21 mg/dL (ref 8–23)
CO2: 24 mmol/L (ref 22–32)
Calcium: 9 mg/dL (ref 8.9–10.3)
Chloride: 108 mmol/L (ref 98–111)
Creatinine, Ser: 1.56 mg/dL — ABNORMAL HIGH (ref 0.61–1.24)
GFR, Estimated: 45 mL/min — ABNORMAL LOW (ref >60)
Glucose, Bld: 162 mg/dL — ABNORMAL HIGH (ref 70–99)
Potassium: 4.6 mmol/L (ref 3.5–5.1)
Sodium: 139 mmol/L (ref 135–145)

## 2021-12-09 LAB — BRAIN NATRIURETIC PEPTIDE: B Natriuretic Peptide: 3344.7 pg/mL — ABNORMAL HIGH (ref 0.0–100.0)

## 2021-12-09 LAB — TSH: TSH: 5.872 u[IU]/mL — ABNORMAL HIGH (ref 0.350–4.500)

## 2021-12-09 NOTE — Patient Instructions (Signed)
There has been no changes to your medications. ? ?Labs done today, your results will be available in MyChart, we will contact you for abnormal readings. ? ? ?We will see you in 4 weeks at clinic to arrange your cardiomems procedure. ? ?Thank you for allowing [place] to provider your heart failure care after your recent hospitalization. Please follow-up with Dr. Allyson Sabal as scheduled.  ? ? ?If you have any questions, issues, or concerns before your next appointment please call our office at [phone], opt. 2 and leave a message for the triage nurse. ? ?

## 2021-12-09 NOTE — Telephone Encounter (Signed)
Heart Failure Nurse Navigator Progress Note  ? ?Called to leave VM, unable, no option for VM to confirm appt. 3/27 @ 12 noon.  ? ?Rhae Hammock, BSN, RN ?Heart Failure Nurse Navigator ?[phone]  ?

## 2021-12-09 NOTE — Progress Notes (Addendum)
? ? ?HEART & VASCULAR TRANSITION OF CARE CONSULT NOTE  ? ? ? ?Referring Physician: Dr. Bonner Puna ?Primary Care: Leeroy Cha, MD ?Primary Cardiologist: Quay Burow, MD ? ? ?HPI: ?Referred to clinic by Dr. Bonner Puna, internal medicine, for heart failure consultation.  ? ?[name]  is 81 y.o. with a history PAF, CAD s/p stenting x2 to RCA in 2003 with residual LAD disease, carotid artery disease s/p carotid artery stenosis s/p LICA endarterectomy [id number], peripheral arterial disease, hypertension, hyperlipidemia, T2DM, renal artery stenosis. ?  ?2003 required PCI x2 to RCA  ?  ?Admitted 12/2020 for hypertensive emergency, flash pulmonary edema, A. fib with RVR due to poor medication adherence.  Home meds were restarted, given one dose of IV lasix 40mg  and he returned back to baseline.   ?  ?Admitted [id number] with A/C diastolic heart failure and HTN urgency. Diuresed with IV lasix.  ?  ?Initially seen in [facility] 03/21/21 and was started on farxiga and lasix was switched to as needed.  ?  ?Admitted 05/2021 with hypertensive urgency and AMS. CT head showed no acute findings but it did show chronic left temporal lobe malacia. EEG was done that showed normal weeks routine normal seizures oriented to formal discharge. Hospital course complicated by AKI. Given IVF with improvement.  ?  ?Admitted 07/2021 A/C HFpEF and hypertension. BNP >4500. Diuresed with IV lasix and transitioned to lasix 20 mg po daily. Discharge weigth 218 pounds. Referred to [facility] clinic. At [facility] clinic f/us he was noted to be mildly fluid overloaded and diuretics increased but ultimately referred back to Dr. Gwenlyn Found for further management. ? ?Unfortunately, he was readmitted [id number] for a/c diastolic CHF, in the setting of hypertensive urgency and atrial fibrillation w/ RVR. Placed on IV Lasix and nitro gtt.  Echo showed normal LVEF 50-55%, G1DD, RV normal. No significant valvular dysfunction. Transitioned back to PO Lasix. Afib rate controlled w/ beta  blocker. Referred back to [facility] clinic. D/c wt 224 lb.  ? ?He presents to clinic today for f/u. Here w/ his wife. Reports breathing has improved but still NYHA Class II-early III. Denies resting dyspnea. No orhtopnea/ PND. Reports full med compliance but admits to occasional dietary indiscretion w/ sodium. Wt is up 5 lb from hospital d/c wt. EKG shows NSR 70 bpm. BP controlled, 135/82.  ? ? ? ?Cardiac Testing  ? ?2D Echo 11/2019  ?Left ventricular ejection fraction, by estimation, is 50 to 55%. The left ventricle has low ?normal function. The left ventricle demonstrates global hypokinesis. There is ?moderate concentric left ventricular hypertrophy. Left ventricular diastolic parameters ?are consistent with Grade I diastolic dysfunction (impaired relaxation). Elevated left ?ventricular end-diastolic pressure. ?1. ?Right ventricular systolic function is normal. The right ventricular size is normal. ?There is normal pulmonary artery systolic pressure. The estimated right ventricular ?systolic pressure is [id number] mmHg. ?2. ?The mitral valve is degenerative. No evidence of mitral valve regurgitation. No ?evidence of mitral stenosis. ?3. ?The aortic valve is normal in structure. Aortic valve regurgitation is not visualized. ?Aortic valve sclerosis/calcification is present, without any evidence of aortic stenosis. ?Aortic valve area, by VTI measures 2.99 cm??. Aortic valve mean gradient measures 4.0 ?mmHg. Aortic valve Vmax measures 1.35 m/s. ?4. ?The inferior vena cava is normal in size with greater than 50% respiratory variability, ?suggesting right atrial pressure of 3 mmHg. ? ? ?Review of Systems: [y] = yes, [ ]  = no  ? ?General: Weight gain [Y ]; Weight loss [ ] ; Anorexia [ ] ; Fatigue [ ] ; Fever [ ] ;  Chills [ ] ; Weakness [ ]   ?Cardiac: Chest pain/pressure [ ] ; Resting SOB [ ] ; Exertional SOB [ Y]; Orthopnea [ ] ; Pedal Edema [ ] ; Palpitations [ ] ; Syncope [ ] ; Presyncope [ ] ; Paroxysmal nocturnal dyspnea[ ]   ?Pulmonary:  Cough [ ] ; Wheezing[ ] ; Hemoptysis[ ] ; Sputum [ ] ; Snoring [ ]   ?GI: Vomiting[ ] ; Dysphagia[ ] ; Melena[ ] ; Hematochezia [ ] ; Heartburn[ ] ; Abdominal pain [ ] ; Constipation [ ] ; Diarrhea [ ] ; BRBPR [ ]   ?GU: Hematuria[ ] ; Dysuria [ ] ; Nocturia[ ]   ?Vascular: Pain in legs with walking [ ] ; Pain in feet with lying flat [ ] ; Non-healing sores [ ] ; Stroke [ ] ; TIA [ ] ; Slurred speech [ ] ;  ?Neuro: Headaches[ ] ; Vertigo[ ] ; Seizures[ ] ; Paresthesias[ ] ;Blurred vision [ ] ; Diplopia [ ] ; Vision changes [ ]   ?Ortho/Skin: Arthritis [ ] ; Joint pain [ ] ; Muscle pain [ ] ; Joint swelling [ ] ; Back Pain [ ] ; Rash [ ]   ?Psych: Depression[ ] ; Anxiety[ ]   ?Heme: Bleeding problems [ ] ; Clotting disorders [ ] ; Anemia [ ]   ?Endocrine: Diabetes [ ] ; Thyroid dysfunction[ Y] ? ? ?Past Medical History:  ?Diagnosis Date  ? Atrial fibrillation ([place]) 03/30/2020  ? Bradycardia, sinus 02/18/2013  ? CAD (coronary artery disease)   ? stent to RCA 2003 also had 40% lesion at that time; NUCLEAR STRESS TEST, 01/16/2010 - normal  now with cath 80-90% stenosis in LAD, will try medical therapy if no improvement PCI  ? Carotid artery disease ([place])   ? Cataract   ? CHF (congestive heart failure) ([place])   ? [facility])   ? LEA DUPLEX, 07/07/2008 - Normal  ? COPD (chronic obstructive pulmonary disease) ([place])   ? DDD (degenerative disc disease) 2008  ? Diabetes mellitus   ? GERD (gastroesophageal reflux disease)   ? H/O hiatal hernia   ? History of colonoscopy 2004  ? finding of tics and AMV only no polpys  ? Hyperlipidemia 02/05/2013  ? Hypertension   ? Onychomycosis   ? Rosacea   ? Tobacco abuse 02/05/2013  ? ? ?Current Outpatient Medications  ?Medication Sig Dispense Refill  ? apixaban (ELIQUIS) 5 MG TABS tablet Take 1 tablet (5 mg total) by mouth 2 (two) times daily. 60 tablet 2  ? clopidogrel (PLAVIX) 75 MG tablet Take 1 tablet (75 mg total) by mouth daily. 30 tablet 1  ? dapagliflozin propanediol (FARXIGA) 10 MG TABS tablet Take 1 tablet (10 mg  total) by mouth daily before breakfast. 30 tablet 2  ? ENTRESTO 49-51 MG Take 1 tablet by mouth 2 (two) times daily.    ? escitalopram (LEXAPRO) 20 MG tablet Take 20 mg by mouth daily.    ? furosemide (LASIX) 20 MG tablet Take 20 mg by mouth daily as needed for fluid or edema.    ? glipiZIDE (GLUCOTROL) 5 MG tablet Take 5 mg by mouth daily before breakfast.    ? isosorbide mononitrate (IMDUR) 60 MG 24 hr tablet Take 2 tablets (120 mg total) by mouth daily. (Patient taking differently: Take 60 mg by mouth daily.) 60 tablet 2  ? levothyroxine (SYNTHROID) 25 MCG tablet Take 25 mcg by mouth every morning.    ? metoprolol tartrate (LOPRESSOR) 25 MG tablet Take 0.5 tablets (12.5 mg total) by mouth 2 (two) times daily. 90 tablet 0  ? OZEMPIC, 0.25 OR 0.5 MG/DOSE, 2 MG/1.5ML SOPN Inject 0.5 mg into the skin once a week.    ? pantoprazole (PROTONIX) 40 MG tablet  Take 40 mg by mouth every other day.    ? spironolactone (ALDACTONE) 25 MG tablet Take 1/2 tablet (12.5 mg total) by mouth daily. 90 tablet 2  ? atorvastatin (LIPITOR) 40 MG tablet Take 40 mg by mouth daily. (Patient not taking: Reported on 12/09/2021)    ? furosemide (LASIX) 40 MG tablet Take 1 tablet (40 mg total) by mouth daily. (Patient not taking: Reported on 12/09/2021) 30 tablet 0  ? Polyethyl Glycol-Propyl Glycol (SYSTANE FREE OP) Place 1 drop into both eyes daily as needed (dry eyes). (Patient not taking: Reported on 12/09/2021)    ? ?Current Facility-Administered Medications  ?Medication Dose Route Frequency Provider Last Rate Last Admin  ? sodium chloride flush (NS) 0.9 % injection 3 mL  3 mL Intravenous Q12H Lorretta Harp, MD      ? ? ?Allergies  ?Allergen Reactions  ? Fentanyl Other (See Comments)  ?  Behavioral changes  ? Gabapentin Other (See Comments)  ?  ankle swells  ? Lisinopril Other (See Comments)  ?  unknown  ? Lyrica [Pregabalin] Other (See Comments)  ?  unknown  ? Metformin Diarrhea  ? Propofol Other (See Comments)  ?  Heart rate dropped   ? ? ?  ?Social History  ? ?Socioeconomic History  ? Marital status: Married  ?  Spouse name: Stacy Sorby  ? Number of children: 1  ? Years of education: Not on file  ? Highest education level: High school grad

## 2021-12-10 DIAGNOSIS — I480 Paroxysmal atrial fibrillation: Secondary | ICD-10-CM | POA: Diagnosis not present

## 2021-12-10 DIAGNOSIS — J9601 Acute respiratory failure with hypoxia: Secondary | ICD-10-CM | POA: Diagnosis not present

## 2021-12-10 DIAGNOSIS — E785 Hyperlipidemia, unspecified: Secondary | ICD-10-CM | POA: Diagnosis not present

## 2021-12-10 DIAGNOSIS — I5033 Acute on chronic diastolic (congestive) heart failure: Secondary | ICD-10-CM | POA: Diagnosis not present

## 2021-12-10 DIAGNOSIS — J449 Chronic obstructive pulmonary disease, unspecified: Secondary | ICD-10-CM | POA: Diagnosis not present

## 2021-12-10 DIAGNOSIS — I110 Hypertensive heart disease with heart failure: Secondary | ICD-10-CM | POA: Diagnosis not present

## 2021-12-13 ENCOUNTER — Encounter (HOSPITAL_COMMUNITY): Payer: Self-pay | Admitting: Cardiology

## 2021-12-13 DIAGNOSIS — I5032 Chronic diastolic (congestive) heart failure: Secondary | ICD-10-CM

## 2021-12-13 MED ORDER — FUROSEMIDE 40 MG PO TABS: 60.0000 mg | ORAL_TABLET | Freq: Every day | ORAL | 6 refills | Status: DC

## 2021-12-13 MED ORDER — ENTRESTO 97-103 MG PO TABS: 1.0000 | ORAL_TABLET | Freq: Two times a day (BID) | ORAL | 11 refills | Status: DC

## 2021-12-13 NOTE — Telephone Encounter (Signed)
Patient called.  Unable to reach patient. No answer unable to leave message-VM not set up. Mychart message sent  ?

## 2021-12-13 NOTE — Telephone Encounter (Signed)
-----   Message from Allayne Butcher, [place] sent at 12/09/2021  1:44 PM EDT ----- ?SCr stable. Increase Entresto to 97-103 mg bid  ?

## 2021-12-18 ENCOUNTER — Encounter (HOSPITAL_COMMUNITY): Payer: Self-pay | Admitting: Radiology

## 2021-12-19 DIAGNOSIS — J449 Chronic obstructive pulmonary disease, unspecified: Secondary | ICD-10-CM | POA: Diagnosis not present

## 2021-12-19 DIAGNOSIS — I480 Paroxysmal atrial fibrillation: Secondary | ICD-10-CM | POA: Diagnosis not present

## 2021-12-19 DIAGNOSIS — E785 Hyperlipidemia, unspecified: Secondary | ICD-10-CM | POA: Diagnosis not present

## 2021-12-19 DIAGNOSIS — I5033 Acute on chronic diastolic (congestive) heart failure: Secondary | ICD-10-CM | POA: Diagnosis not present

## 2021-12-19 DIAGNOSIS — I110 Hypertensive heart disease with heart failure: Secondary | ICD-10-CM | POA: Diagnosis not present

## 2021-12-19 DIAGNOSIS — J9601 Acute respiratory failure with hypoxia: Secondary | ICD-10-CM | POA: Diagnosis not present

## 2021-12-23 DIAGNOSIS — J9601 Acute respiratory failure with hypoxia: Secondary | ICD-10-CM | POA: Diagnosis not present

## 2021-12-23 DIAGNOSIS — I110 Hypertensive heart disease with heart failure: Secondary | ICD-10-CM | POA: Diagnosis not present

## 2021-12-23 DIAGNOSIS — E785 Hyperlipidemia, unspecified: Secondary | ICD-10-CM | POA: Diagnosis not present

## 2021-12-23 DIAGNOSIS — I5033 Acute on chronic diastolic (congestive) heart failure: Secondary | ICD-10-CM | POA: Diagnosis not present

## 2021-12-23 DIAGNOSIS — I480 Paroxysmal atrial fibrillation: Secondary | ICD-10-CM | POA: Diagnosis not present

## 2021-12-23 DIAGNOSIS — J449 Chronic obstructive pulmonary disease, unspecified: Secondary | ICD-10-CM | POA: Diagnosis not present

## 2021-12-24 ENCOUNTER — Other Ambulatory Visit (HOSPITAL_COMMUNITY): Payer: Medicare Other

## 2021-12-28 DIAGNOSIS — I110 Hypertensive heart disease with heart failure: Secondary | ICD-10-CM | POA: Diagnosis not present

## 2021-12-28 DIAGNOSIS — Z7985 Long-term (current) use of injectable non-insulin antidiabetic drugs: Secondary | ICD-10-CM | POA: Diagnosis not present

## 2021-12-28 DIAGNOSIS — E669 Obesity, unspecified: Secondary | ICD-10-CM | POA: Diagnosis not present

## 2021-12-28 DIAGNOSIS — I480 Paroxysmal atrial fibrillation: Secondary | ICD-10-CM | POA: Diagnosis not present

## 2021-12-28 DIAGNOSIS — Z9181 History of falling: Secondary | ICD-10-CM | POA: Diagnosis not present

## 2021-12-28 DIAGNOSIS — Z7902 Long term (current) use of antithrombotics/antiplatelets: Secondary | ICD-10-CM | POA: Diagnosis not present

## 2021-12-28 DIAGNOSIS — E1136 Type 2 diabetes mellitus with diabetic cataract: Secondary | ICD-10-CM | POA: Diagnosis not present

## 2021-12-28 DIAGNOSIS — Z6833 Body mass index (BMI) 33.0-33.9, adult: Secondary | ICD-10-CM | POA: Diagnosis not present

## 2021-12-28 DIAGNOSIS — F17210 Nicotine dependence, cigarettes, uncomplicated: Secondary | ICD-10-CM | POA: Diagnosis not present

## 2021-12-28 DIAGNOSIS — I2510 Atherosclerotic heart disease of native coronary artery without angina pectoris: Secondary | ICD-10-CM | POA: Diagnosis not present

## 2021-12-28 DIAGNOSIS — J9601 Acute respiratory failure with hypoxia: Secondary | ICD-10-CM | POA: Diagnosis not present

## 2021-12-28 DIAGNOSIS — E785 Hyperlipidemia, unspecified: Secondary | ICD-10-CM | POA: Diagnosis not present

## 2021-12-28 DIAGNOSIS — J449 Chronic obstructive pulmonary disease, unspecified: Secondary | ICD-10-CM | POA: Diagnosis not present

## 2021-12-28 DIAGNOSIS — Z7901 Long term (current) use of anticoagulants: Secondary | ICD-10-CM | POA: Diagnosis not present

## 2021-12-28 DIAGNOSIS — M519 Unspecified thoracic, thoracolumbar and lumbosacral intervertebral disc disorder: Secondary | ICD-10-CM | POA: Diagnosis not present

## 2021-12-28 DIAGNOSIS — I5033 Acute on chronic diastolic (congestive) heart failure: Secondary | ICD-10-CM | POA: Diagnosis not present

## 2021-12-28 DIAGNOSIS — K219 Gastro-esophageal reflux disease without esophagitis: Secondary | ICD-10-CM | POA: Diagnosis not present

## 2021-12-28 DIAGNOSIS — E1165 Type 2 diabetes mellitus with hyperglycemia: Secondary | ICD-10-CM | POA: Diagnosis not present

## 2021-12-28 DIAGNOSIS — E1151 Type 2 diabetes mellitus with diabetic peripheral angiopathy without gangrene: Secondary | ICD-10-CM | POA: Diagnosis not present

## 2021-12-28 DIAGNOSIS — Z7984 Long term (current) use of oral hypoglycemic drugs: Secondary | ICD-10-CM | POA: Diagnosis not present

## 2021-12-28 DIAGNOSIS — Z9981 Dependence on supplemental oxygen: Secondary | ICD-10-CM | POA: Diagnosis not present

## 2021-12-31 ENCOUNTER — Ambulatory Visit (INDEPENDENT_AMBULATORY_CARE_PROVIDER_SITE_OTHER): Payer: Medicare Other | Admitting: Cardiovascular Disease

## 2021-12-31 ENCOUNTER — Encounter: Payer: Self-pay | Admitting: Cardiovascular Disease

## 2021-12-31 DIAGNOSIS — I739 Peripheral vascular disease, unspecified: Secondary | ICD-10-CM

## 2021-12-31 DIAGNOSIS — Z720 Tobacco use: Secondary | ICD-10-CM

## 2021-12-31 DIAGNOSIS — I6522 Occlusion and stenosis of left carotid artery: Secondary | ICD-10-CM | POA: Diagnosis not present

## 2021-12-31 DIAGNOSIS — E782 Mixed hyperlipidemia: Secondary | ICD-10-CM

## 2021-12-31 DIAGNOSIS — I2510 Atherosclerotic heart disease of native coronary artery without angina pectoris: Secondary | ICD-10-CM

## 2021-12-31 DIAGNOSIS — I4891 Unspecified atrial fibrillation: Secondary | ICD-10-CM | POA: Diagnosis not present

## 2021-12-31 DIAGNOSIS — I5033 Acute on chronic diastolic (congestive) heart failure: Secondary | ICD-10-CM

## 2021-12-31 DIAGNOSIS — I10 Essential (primary) hypertension: Secondary | ICD-10-CM

## 2021-12-31 NOTE — Patient Instructions (Signed)
Medication Instructions:  ?Your physician recommends that you continue on your current medications as directed. Please refer to the Current Medication list given to you today. ? ?*If you need a refill on your cardiac medications before your next appointment, please call your pharmacy* ? ? ?Lab Work: ?Your physician recommends that you return for lab work in: next week or 2 for FASTING lipid/liver profile. ? ?If you have labs (blood work) drawn today and your tests are completely normal, you will receive your results only by: ?MyChart Message (if you have MyChart) OR ?A paper copy in the mail ?If you have any lab test that is abnormal or we need to change your treatment, we will call you to review the results. ? ? ?Follow-Up: ?At [facility], you and your health needs are our priority.  As part of our continuing mission to provide you with exceptional heart care, we have created designated Provider Care Teams.  These Care Teams include your primary Cardiologist (physician) and Advanced Practice Providers (APPs -  Physician Assistants and Nurse Practitioners) who all work together to provide you with the care you need, when you need it. ? ?We recommend signing up for the patient portal called "MyChart".  Sign up information is provided on this After Visit Summary.  MyChart is used to connect with patients for Virtual Visits (Telemedicine).  Patients are able to view lab/test results, encounter notes, upcoming appointments, etc.  Non-urgent messages can be sent to your provider as well.   ?To learn more about what you can do with MyChart, go to [email or web address].   ? ?Your next appointment:   ?3 month(s) ? ?The format for your next appointment:   ?In Person ? ?Provider:   ?[name], FNP, Micah Flesher, PA-C, Marjie Skiff, PA-C, Juanda Crumble, PA-C, Joni Reining, DNP, ANP, Azalee Course, PA-C, or Bernadene Person, NP     ? ? ?Then, Nanetta Batty, MD will plan to see you again in 6 month(s). ?

## 2021-12-31 NOTE — Assessment & Plan Note (Signed)
History of hyperlipidemia currently not on statin therapy.  We will recheck a lipid liver profile. 

## 2021-12-31 NOTE — Assessment & Plan Note (Signed)
History of CAD status post RCA stenting by myself back in 2003 with residual LAD disease.  He was catheterized again in 2014 revealing 80 to 90% focal distal LAD stenosis.  The RCA a stent was widely patent.  I catheterized him again 04/02/2020 revealing a patent RCA stent with an intermediate lesion just beyond the stented segment that was negative by DFR.  He denies chest pain or shortness of breath. ?

## 2021-12-31 NOTE — Assessment & Plan Note (Signed)
Currently not smoking but he is vaping. ?

## 2021-12-31 NOTE — Assessment & Plan Note (Signed)
History of PAF maintaining sinus rhythm on Eliquis oral anticoagulation. 

## 2021-12-31 NOTE — Progress Notes (Signed)
? ? ? ?12/31/2021 ?Albert Powell   ?March 18, 1941  ?[id number] ? ?Primary Physician Albert Cha, MD ?Primary Cardiologist: Albert Harp MD Albert Powell, [place] ? ?HPI:  Albert Powell is a 81 y.o.  moderately overweight married Caucasian male with known history of CAD. I last saw in the office 10/08/2021.[name]  He is accompanied by his wife Albert Powell today.  He is  status post stenting with 2 stents to RCA in 2003 and residual LAD, hypertension and hyperlipidemia and diabetes mellitus on diet, presented to the ER because of jaw discomfort with chest pressure for 2-3 days. Patient's symptoms usually happen in the night. .  ? ?Patient was negative for myocardial infarction he had a LexiScan Myoview 02/07/13 which was negative for ischemia, but due to similarities of his prior coronary disease and the symptoms and multiple risk factors for coronary disease including family history hypertension diabetes known coronary artery disease patient was scheduled for cardiac catheterization with Dr. Gwenlyn Powell. Revealing 80-90% focal distal LAD disease and a 1.5-1.7 mm vessel left circumflex nondominant with a 90% AV groove stenosis in a small vessel there was a moderate to large obtuse marginal branch with a 60% proximal segmental calcified stenosis. The RCA had widely patent stents. EF was 60%. Due to the small size of the vessels and the negative nuclear stress test that are very filled medical therapy was the patient's best option.  ?Patient was seen back in followup last week with a heart rate of 48 symptomatic with weakness feeling very tired. He still had some shortness of breath but no jaw pain which is his anginal equivalent. Medications were adjusted because Lopressor actually had him hold it one day and then he was started back half a tablet daily he felt better so he is now resumed half tablet twice a day.  ? ?Today's back for followup initially his heart rate was 60 after he walked in the room but after sitting  for 10-15 minutes EKG revealed sinus bradycardia rate of 49 but no acute changes otherwise. His blood pressure was improved from his last visit.  ?Additionally he denies any shortness of breath he denies any jaw pain.  ?Since he was seen by Albert Powell 02/24/13 and his beta blockers were adjusted he no longer is bradycardic or symptomatic. He denies chest pain or shortness of breath. I do hear a carotid bruit on exam and cardiac echo Doppler studies on him.  ?I performed carotid Dopplers on him 04/11/13 which revealed a moderately high-grade left ICA stenosis. He is neurologically asymptomatic. Based on the intermediate nature of his Doppler study is unclear to me whether he has a lesion that can be followed conservatively by ultrasound or requires surgical revascularization. Because of this I decided to proceed with carotid angiography to further define the degree of stenosis.this was performed on 04/25/13 revealing a 95% proximal left internal carotid artery stenosis which suddenly underwent endarterectomy by Dr. Trula Powell on 06/02/13.I also performed angiography on his aortoiliac Arteries revealing 90% calcified right extrailiac artery stenosis.I performed diamondback or rotational atherectomy, PT and stenting of his right external iliac artery on 10/20/13. He had an excellent angiographic and clinical result. His post procedure Dopplers have improved. He did have a small pseudoaneurysm in his right groin which was successfully compressed at the hospital and has remained closed.  ?Since I saw me year ago his major complaints have been lifestyle limiting back and hip pain. He's had imaging studies that suggest spinal stenosis and wishes to  undergo decompressive laminectomy by Dr. Rolena Powell in the upcoming future.. He had lower extremity Dopplers performed in our office 04/08/17 that showed a right ABI of 0.85 and a left aBI of 0.96. ?  ?I repeated his lower extremity arterial Doppler studies which showed some progression of  disease however he ultimately underwent angiography by Dr. Trula Powell 03/30/2018, 2 months after his Dopplers, and had covered stenting of both iliac arteries.  He continues to have similar symptoms as he did prior to his intervention.  He denies chest pain or shortness of breath. ?  ?I performed coronary angiography on him the setting of chest pain with A. fib with RVR 04/02/2020 revealing a patent RCA stent with an intermediate lesion just beyond the stented segment that was negative by DFR.  Medical therapy is recommended.  He did convert spontaneously to sinus rhythm and is on Albert Powell oral anticoagulation in addition to Albert Powell. ?  ?He had lower extremity arterial Doppler studies performed 08/15/2020 revealing a right ABI of 0.26 and a left of 0.80.  He had monophasic right iliac waveforms and significant disease in his right common femoral artery and SFA.  He does have lifestyle limiting right calf claudication wishes to proceed with angiography. ? ?I performed peripheral angiography on him 08/27/2020 revealing patent bilateral common iliac artery stents and right extrailiac artery stent.  He did have 50 to 70% calcified stenoses in his left extrailiac artery and common femoral artery as well as high-grade diffuse calcified 90% calcified stenoses in the distal right extrailiac artery and common femoral artery as well as a focal 99% mid right SFA stenosis.  My intent was to send him back to Dr. Trula Powell for considerations for surgical revascularization, endarterectomy with patch angioplasty but however this never occurred. ? ?He was recently admitted with hypertension diastolic heart failure on 06/06/2021 and was diuresed.  He was discharged home 4 days later.  He is now watching his diet including salt restriction and is not on oral diuretic.  ?  ?He was hospitalized again 08/05/2021 for volume overload and acute on chronic diastolic heart failure and was diuresed.  He saw Albert Grinder, NP and Ellen Henri PA-C in the  office in early December.  His diuretics were adjusted. ? ?He was admitted to the hospital [id number] with diastolic heart failure due to medication noncompliance and dietary indiscretion.  He was there for 4 days and was diuresed.  He was seen by Ellen Henri in the heart failure clinic 12/09/2021.  He is currently taking his diuretics and daily basis as prescribed and is weighing himself.  He no longer has resting dyspnea or peripheral edema. ? ?Current Meds  ?Medication Sig  ? apixaban (Albert Powell) 5 MG TABS tablet Take 1 tablet (5 mg total) by mouth 2 (two) times daily.  ? clopidogrel (Albert Powell) 75 MG tablet Take 1 tablet (75 mg total) by mouth daily.  ? dapagliflozin propanediol (FARXIGA) 10 MG TABS tablet Take 1 tablet (10 mg total) by mouth daily before breakfast.  ? escitalopram (LEXAPRO) 20 MG tablet Take 20 mg by mouth daily.  ? furosemide (LASIX) 40 MG tablet Take 1.5 tablets (60 mg total) by mouth daily.  ? glipiZIDE (GLUCOTROL) 5 MG tablet Take 5 mg by mouth daily before breakfast.  ? isosorbide mononitrate (IMDUR) 60 MG 24 hr tablet Take 2 tablets (120 mg total) by mouth daily. (Patient taking differently: Take 60 mg by mouth daily.)  ? levothyroxine (SYNTHROID) 25 MCG tablet Take 25 mcg by mouth every morning.  ?  metoprolol tartrate (LOPRESSOR) 25 MG tablet Take 0.5 tablets (12.5 mg total) by mouth 2 (two) times daily.  ? OZEMPIC, 0.25 OR 0.5 MG/DOSE, 2 MG/1.5ML SOPN Inject 0.5 mg into the skin once a week.  ? pantoprazole (PROTONIX) 40 MG tablet Take 40 mg by mouth every other day.  ? Polyethyl Glycol-Propyl Glycol (SYSTANE FREE OP) Place 1 drop into both eyes daily as needed (dry eyes).  ? sacubitril-valsartan (ENTRESTO) 97-103 MG Take 1 tablet by mouth 2 (two) times daily.  ?  ? ?Allergies  ?Allergen Reactions  ? Fentanyl Other (See Comments)  ?  Behavioral changes  ? Gabapentin Other (See Comments)  ?  ankle swells  ? Lisinopril Other (See Comments)  ?  unknown  ? Lyrica [Pregabalin] Other (See  Comments)  ?  unknown  ? Metformin Diarrhea  ? Propofol Other (See Comments)  ?  Heart rate dropped  ? ? ?Social History  ? ?Socioeconomic History  ? Marital status: Married  ?  Spouse name: Marcelo Baldy

## 2021-12-31 NOTE — Assessment & Plan Note (Addendum)
Mr. Manna has acute on chronic diastolic heart failure.  He was recently hospitalized with volume overload 11/23/2021 for 4 days.  He was not taking his diuretics as prescribed.  He also admits to dietary indiscretion with regards to salt.  He was diuresed in the hospital.  He is currently taking his diuretics on it daily basis and is weighing himself as well.  He no longer complains of resting dyspnea orthopnea. ?

## 2021-12-31 NOTE — Assessment & Plan Note (Signed)
History of essential hypertension a blood pressure measured today at 128/71.  He is on metoprolol and Entresto. ?

## 2021-12-31 NOTE — Assessment & Plan Note (Signed)
History of peripheral arterial disease status post orbital atherectomy, PTA and stenting of his right external iliac artery by myself 10/20/2013.  He had bilateral iliac stenting by Dr. Trula Slade 03/30/2018.  I performed angiography on him 08/27/2020 revealing a patent common extrailiac artery stents on the right.  He did have 50 to 70% calcified stenosis in the left extrailiac artery as well as high-grade distal common femoral artery stenosis and a 99% focal mid right SFA stenosis as well.  He currently denies claudication. ?

## 2022-01-02 DIAGNOSIS — I5033 Acute on chronic diastolic (congestive) heart failure: Secondary | ICD-10-CM | POA: Diagnosis not present

## 2022-01-02 DIAGNOSIS — I480 Paroxysmal atrial fibrillation: Secondary | ICD-10-CM | POA: Diagnosis not present

## 2022-01-02 DIAGNOSIS — E785 Hyperlipidemia, unspecified: Secondary | ICD-10-CM | POA: Diagnosis not present

## 2022-01-02 DIAGNOSIS — J9601 Acute respiratory failure with hypoxia: Secondary | ICD-10-CM | POA: Diagnosis not present

## 2022-01-02 DIAGNOSIS — I110 Hypertensive heart disease with heart failure: Secondary | ICD-10-CM | POA: Diagnosis not present

## 2022-01-02 DIAGNOSIS — J449 Chronic obstructive pulmonary disease, unspecified: Secondary | ICD-10-CM | POA: Diagnosis not present

## 2022-01-08 ENCOUNTER — Telehealth (HOSPITAL_COMMUNITY): Payer: Self-pay | Admitting: Cardiology

## 2022-01-08 NOTE — Telephone Encounter (Signed)
Cardio mems application ?With patients insurance ?No pre cert required ? ?Multiple attempts to contact patient to schedule ?Upcoming appt notes updated  ?

## 2022-01-08 NOTE — Progress Notes (Incomplete)
? ?ADVANCED HF CLINIC CONSULT NOTE ? ?Referring Physician: ?Primary Care: ?Primary Cardiologist: ? ?HPI: ?[name] is a 81 y.o. with a history PAF, CAD s/p stenting x2 to RCA in 2003 with residual LAD disease, carotid artery disease s/p carotid artery stenosis s/p LICA endarterectomy [id number], peripheral arterial disease, hypertension, hyperlipidemia, T2DM, renal artery stenosis. ?  ?2003 required PCI x 2 to RCA  ?  ?Admitted 12/2020 for hypertensive emergency, flash pulmonary edema, A. fib with RVR due to poor medication adherence.  Home meds were restarted, given one dose of IV lasix 40mg  and he returned back to baseline.   ?  ?Admitted [id number] with A/C diastolic heart failure and HTN urgency. Diuresed with IV lasix.  ?  ?Initially seen in [facility] 03/21/21 and was started on farxiga and lasix was switched to as needed.  ?  ?Admitted 05/2021 with hypertensive urgency and AMS. CT head showed no acute findings but it did show chronic left temporal lobe malacia. EEG was done that showed normal weeks routine normal seizures oriented to formal discharge. Hospital course complicated by AKI. Given IVF with improvement.  ?  ?Admitted 07/2021 A/C HFpEF and hypertension. BNP >4500. Diuresed with IV lasix and transitioned to lasix 20 mg po daily. Discharge weigth 218 pounds. Referred to [facility] clinic. At [facility] clinic f/us he was noted to be mildly fluid overloaded and diuretics increased but ultimately referred back to Dr. Gwenlyn Found for further management. ?  ?Unfortunately, he was readmitted [id number] for a/c diastolic CHF, in the setting of hypertensive urgency and atrial fibrillation w/ RVR. Placed on IV Lasix and nitro gtt.  Echo showed normal LVEF 50-55%, G1DD, RV normal. No significant valvular dysfunction. Transitioned back to PO Lasix. Afib rate controlled w/ beta blocker. Referred back to [facility] clinic. D/c wt 224 lb.  ?  ?He presents to clinic today for f/u. Here w/ his wife. Reports breathing has improved but still NYHA Class  II-early III. Denies resting dyspnea. No orhtopnea/ PND. Reports full med compliance but admits to occasional dietary indiscretion w/ sodium. Wt is up 5 lb from hospital d/c wt. EKG shows NSR 70 bpm. BP controlled, 135/82.  ?  ?  ?  ?Cardiac Testing  ?- Echo (3/21): EF 50-55%, global LV HK, moderate LVH, grade I DD, normal RV, degenerative mitral valve, no MS, aortic valve sclerosis/calcification is present, without any evidence of aortic stenosis. Aortic valve area, by VTI measures 2.99 cm??. Aortic valve mean gradient measures 4.0 mmHg. Aortic valve Vmax measures 1.35 m/s. ? ?Review of Systems: [y] = yes, [ ]  = no  ? ?General: Weight gain [ ] ; Weight loss [ ] ; Anorexia [ ] ; Fatigue [ ] ; Fever [ ] ; Chills [ ] ; Weakness [ ]   ?Cardiac: Chest pain/pressure [ ] ; Resting SOB [ ] ; Exertional SOB [ ] ; Orthopnea [ ] ; Pedal Edema [ ] ; Palpitations [ ] ; Syncope [ ] ; Presyncope [ ] ; Paroxysmal nocturnal dyspnea[ ]   ?Pulmonary: Cough [ ] ; Wheezing[ ] ; Hemoptysis[ ] ; Sputum [ ] ; Snoring [ ]   ?GI: Vomiting[ ] ; Dysphagia[ ] ; Melena[ ] ; Hematochezia [ ] ; Heartburn[ ] ; Abdominal pain [ ] ; Constipation [ ] ; Diarrhea [ ] ; BRBPR [ ]   ?GU: Hematuria[ ] ; Dysuria [ ] ; Nocturia[ ]   ?Vascular: Pain in legs with walking [ ] ; Pain in feet with lying flat [ ] ; Non-healing sores [ ] ; Stroke [ ] ; TIA [ ] ; Slurred speech [ ] ;  ?Neuro: Headaches[ ] ; Vertigo[ ] ; Seizures[ ] ; Paresthesias[ ] ;Blurred vision [ ] ; Diplopia [ ] ; Vision changes [ ]   ?  Ortho/Skin: Arthritis [ ] ; Joint pain [ ] ; Muscle pain [ ] ; Joint swelling [ ] ; Back Pain [ ] ; Rash [ ]   ?Psych: Depression[ ] ; Anxiety[ ]   ?Heme: Bleeding problems [ ] ; Clotting disorders [ ] ; Anemia [ ]   ?Endocrine: Diabetes [ ] ; Thyroid dysfunction[ ]  ? ? ?Past Medical History:  ?Diagnosis Date  ? Atrial fibrillation ([place]) 03/30/2020  ? Bradycardia, sinus 02/18/2013  ? CAD (coronary artery disease)   ? stent to RCA 2003 also had 40% lesion at that time; NUCLEAR STRESS TEST, 01/16/2010 - normal  now with  cath 80-90% stenosis in LAD, will try medical therapy if no improvement PCI  ? Carotid artery disease ([place])   ? Cataract   ? CHF (congestive heart failure) ([place])   ? [facility])   ? LEA DUPLEX, 07/07/2008 - Normal  ? COPD (chronic obstructive pulmonary disease) ([place])   ? DDD (degenerative disc disease) 2008  ? Diabetes mellitus   ? GERD (gastroesophageal reflux disease)   ? H/O hiatal hernia   ? History of colonoscopy 2004  ? finding of tics and AMV only no polpys  ? Hyperlipidemia 02/05/2013  ? Hypertension   ? Onychomycosis   ? Rosacea   ? Tobacco abuse 02/05/2013  ? ? ?Current Outpatient Medications  ?Medication Sig Dispense Refill  ? apixaban (ELIQUIS) 5 MG TABS tablet Take 1 tablet (5 mg total) by mouth 2 (two) times daily. 60 tablet 2  ? atorvastatin (LIPITOR) 40 MG tablet Take 40 mg by mouth daily. (Patient not taking: Reported on 12/09/2021)    ? clopidogrel (PLAVIX) 75 MG tablet Take 1 tablet (75 mg total) by mouth daily. 30 tablet 1  ? dapagliflozin propanediol (FARXIGA) 10 MG TABS tablet Take 1 tablet (10 mg total) by mouth daily before breakfast. 30 tablet 2  ? escitalopram (LEXAPRO) 20 MG tablet Take 20 mg by mouth daily.    ? furosemide (LASIX) 40 MG tablet Take 1.5 tablets (60 mg total) by mouth daily. 45 tablet 6  ? glipiZIDE (GLUCOTROL) 5 MG tablet Take 5 mg by mouth daily before breakfast.    ? isosorbide mononitrate (IMDUR) 60 MG 24 hr tablet Take 2 tablets (120 mg total) by mouth daily. (Patient taking differently: Take 60 mg by mouth daily.) 60 tablet 2  ? levothyroxine (SYNTHROID) 25 MCG tablet Take 25 mcg by mouth every morning.    ? metoprolol tartrate (LOPRESSOR) 25 MG tablet Take 0.5 tablets (12.5 mg total) by mouth 2 (two) times daily. 90 tablet 0  ? OZEMPIC, 0.25 OR 0.5 MG/DOSE, 2 MG/1.5ML SOPN Inject 0.5 mg into the skin once a week.    ? pantoprazole (PROTONIX) 40 MG tablet Take 40 mg by mouth every other day.    ? Polyethyl Glycol-Propyl Glycol (SYSTANE FREE OP) Place 1 drop into  both eyes daily as needed (dry eyes).    ? sacubitril-valsartan (ENTRESTO) 97-103 MG Take 1 tablet by mouth 2 (two) times daily. 60 tablet 11  ? spironolactone (ALDACTONE) 25 MG tablet Take 1/2 tablet (12.5 mg total) by mouth daily. 90 tablet 2  ? ?No current facility-administered medications for this visit.  ? ? ?Allergies  ?Allergen Reactions  ? Fentanyl Other (See Comments)  ?  Behavioral changes  ? Gabapentin Other (See Comments)  ?  ankle swells  ? Lisinopril Other (See Comments)  ?  unknown  ? Lyrica [Pregabalin] Other (See Comments)  ?  unknown  ? Metformin Diarrhea  ? Propofol Other (See Comments)  ?  Heart rate dropped  ? ? ?  ?Social History  ? ?Socioeconomic History  ? Marital status: Married  ?  Spouse name: Albert Powell  ? Number of children: 1  ? Years of education: Not on file  ? Highest education level: High school graduate  ?Occupational History  ? Occupation: retired  ?Tobacco Use  ? Smoking status: Every Day  ?  Packs/day: 1.50  ?  Years: 57.00  ?  Pack years: 85.50  ?  Types: Cigarettes, E-cigarettes  ? Smokeless tobacco: Never  ? Tobacco comments:  ?  pt states that he is using the vapor cigs  ?Vaping Use  ? Vaping Use: Every day  ? Start date: 09/15/2016  ? Substances: Nicotine  ?Substance and Sexual Activity  ? Alcohol use: Yes  ?  Alcohol/week: 1.0 standard drink  ?  Types: 1 Glasses of wine per week  ?  Comment: "very little"  ? Drug use: No  ? Sexual activity: Not on file  ?Other Topics Concern  ? Not on file  ?Social History Narrative  ? Not on file  ? ?Social Determinants of Health  ? ?Financial Resource Strain: Low Risk   ? Difficulty of Paying Living Expenses: Not very hard  ?Food Insecurity: No Food Insecurity  ? Worried About Charity fundraiser in the Last Year: Never true  ? Ran Out of Food in the Last Year: Never true  ?Transportation Needs: No Transportation Needs  ? Lack of Transportation (Medical): No  ? Lack of Transportation (Non-Medical): No  ?Physical Activity: Not on  file  ?Stress: Not on file  ?Social Connections: Not on file  ?Intimate Partner Violence: Not on file  ? ? ?  ?Family History  ?Problem Relation Age of Onset  ? Heart disease Father   ?     heart attack at 32  ?

## 2022-01-09 ENCOUNTER — Telehealth (HOSPITAL_COMMUNITY): Payer: Self-pay

## 2022-01-09 DIAGNOSIS — J9601 Acute respiratory failure with hypoxia: Secondary | ICD-10-CM | POA: Diagnosis not present

## 2022-01-09 DIAGNOSIS — I110 Hypertensive heart disease with heart failure: Secondary | ICD-10-CM | POA: Diagnosis not present

## 2022-01-09 DIAGNOSIS — E785 Hyperlipidemia, unspecified: Secondary | ICD-10-CM | POA: Diagnosis not present

## 2022-01-09 DIAGNOSIS — I5033 Acute on chronic diastolic (congestive) heart failure: Secondary | ICD-10-CM | POA: Diagnosis not present

## 2022-01-09 DIAGNOSIS — J449 Chronic obstructive pulmonary disease, unspecified: Secondary | ICD-10-CM | POA: Diagnosis not present

## 2022-01-09 DIAGNOSIS — I480 Paroxysmal atrial fibrillation: Secondary | ICD-10-CM | POA: Diagnosis not present

## 2022-01-09 NOTE — Telephone Encounter (Signed)
Called and was unable to leave a voice message to confirm/remind patient of their appointment at the [facility] on 01/10/22.  ? ? ? ?

## 2022-01-10 ENCOUNTER — Encounter (HOSPITAL_COMMUNITY): Payer: Medicare Other

## 2022-01-15 DIAGNOSIS — E785 Hyperlipidemia, unspecified: Secondary | ICD-10-CM | POA: Diagnosis not present

## 2022-01-15 DIAGNOSIS — J449 Chronic obstructive pulmonary disease, unspecified: Secondary | ICD-10-CM | POA: Diagnosis not present

## 2022-01-15 DIAGNOSIS — I480 Paroxysmal atrial fibrillation: Secondary | ICD-10-CM | POA: Diagnosis not present

## 2022-01-15 DIAGNOSIS — J9601 Acute respiratory failure with hypoxia: Secondary | ICD-10-CM | POA: Diagnosis not present

## 2022-01-15 DIAGNOSIS — I5033 Acute on chronic diastolic (congestive) heart failure: Secondary | ICD-10-CM | POA: Diagnosis not present

## 2022-01-15 DIAGNOSIS — I110 Hypertensive heart disease with heart failure: Secondary | ICD-10-CM | POA: Diagnosis not present

## 2022-01-17 DIAGNOSIS — J9601 Acute respiratory failure with hypoxia: Secondary | ICD-10-CM | POA: Diagnosis not present

## 2022-01-17 DIAGNOSIS — J449 Chronic obstructive pulmonary disease, unspecified: Secondary | ICD-10-CM | POA: Diagnosis not present

## 2022-01-17 DIAGNOSIS — I480 Paroxysmal atrial fibrillation: Secondary | ICD-10-CM | POA: Diagnosis not present

## 2022-01-17 DIAGNOSIS — I5033 Acute on chronic diastolic (congestive) heart failure: Secondary | ICD-10-CM | POA: Diagnosis not present

## 2022-01-17 DIAGNOSIS — I110 Hypertensive heart disease with heart failure: Secondary | ICD-10-CM | POA: Diagnosis not present

## 2022-01-17 DIAGNOSIS — E785 Hyperlipidemia, unspecified: Secondary | ICD-10-CM | POA: Diagnosis not present

## 2022-01-22 DIAGNOSIS — I110 Hypertensive heart disease with heart failure: Secondary | ICD-10-CM | POA: Diagnosis not present

## 2022-01-22 DIAGNOSIS — I5033 Acute on chronic diastolic (congestive) heart failure: Secondary | ICD-10-CM | POA: Diagnosis not present

## 2022-01-22 DIAGNOSIS — J449 Chronic obstructive pulmonary disease, unspecified: Secondary | ICD-10-CM | POA: Diagnosis not present

## 2022-01-22 DIAGNOSIS — I480 Paroxysmal atrial fibrillation: Secondary | ICD-10-CM | POA: Diagnosis not present

## 2022-01-22 DIAGNOSIS — E785 Hyperlipidemia, unspecified: Secondary | ICD-10-CM | POA: Diagnosis not present

## 2022-01-22 DIAGNOSIS — J9601 Acute respiratory failure with hypoxia: Secondary | ICD-10-CM | POA: Diagnosis not present

## 2022-01-24 DIAGNOSIS — I480 Paroxysmal atrial fibrillation: Secondary | ICD-10-CM | POA: Diagnosis not present

## 2022-01-24 DIAGNOSIS — J449 Chronic obstructive pulmonary disease, unspecified: Secondary | ICD-10-CM | POA: Diagnosis not present

## 2022-01-24 DIAGNOSIS — I5033 Acute on chronic diastolic (congestive) heart failure: Secondary | ICD-10-CM | POA: Diagnosis not present

## 2022-01-24 DIAGNOSIS — J9601 Acute respiratory failure with hypoxia: Secondary | ICD-10-CM | POA: Diagnosis not present

## 2022-01-24 DIAGNOSIS — I110 Hypertensive heart disease with heart failure: Secondary | ICD-10-CM | POA: Diagnosis not present

## 2022-01-24 DIAGNOSIS — E785 Hyperlipidemia, unspecified: Secondary | ICD-10-CM | POA: Diagnosis not present

## 2022-03-12 ENCOUNTER — Other Ambulatory Visit (HOSPITAL_COMMUNITY): Payer: Self-pay | Admitting: Cardiology

## 2022-04-05 NOTE — Progress Notes (Deleted)
Cardiology Clinic Note   Patient Name: Albert Powell Date of Encounter: 04/05/2022  Primary Care Provider:  Leeroy Cha, MD Primary Cardiologist:  Quay Burow, MD  Patient Profile    Albert Powell 81 year old male presents to the clinic today for follow-up evaluation of his sinus bradycardia, coronary artery disease, and peripheral arterial disease.  Past Medical History    Past Medical History:  Diagnosis Date   Atrial fibrillation ([place]) 03/30/2020   Bradycardia, sinus 02/18/2013   CAD (coronary artery disease)    stent to RCA 2003 also had 40% lesion at that time; NUCLEAR STRESS TEST, 01/16/2010 - normal  now with cath 80-90% stenosis in LAD, will try medical therapy if no improvement PCI   Carotid artery disease ([name])    Cataract    CHF (congestive heart failure) ([place])    Claudication ([place])    LEA DUPLEX, 07/07/2008 - Normal   COPD (chronic obstructive pulmonary disease) ([place])    DDD (degenerative disc disease) 2008   Diabetes mellitus    GERD (gastroesophageal reflux disease)    H/O hiatal hernia    History of colonoscopy 2004   finding of tics and AMV only no polpys   Hyperlipidemia 02/05/2013   Hypertension    Onychomycosis    Rosacea    Tobacco abuse 02/05/2013   Past Surgical History:  Procedure Laterality Date   ABDOMINAL AORTOGRAM W/LOWER EXTREMITY N/A 03/30/2018   Procedure: ABDOMINAL AORTOGRAM W/LOWER EXTREMITY;  Surgeon: Serafina Mitchell, MD;  Location: [place] [facility];  Service: Cardiovascular;  Laterality: N/A;   ABDOMINAL AORTOGRAM W/LOWER EXTREMITY Bilateral 08/27/2020   Procedure: ABDOMINAL AORTOGRAM W/LOWER EXTREMITY;  Surgeon: Lorretta Harp, MD;  Location: [place] [facility];  Service: Cardiovascular;  Laterality: Bilateral;   APPENDECTOMY     CARDIAC CATHETERIZATION  08/29/2002   2-vessel CAD with high-grade stenoses in RCA; stenting to RCA   CARDIAC CATHETERIZATION  2014   80-90% lesion will try medical therapy    CARDIAC SURGERY     stents  2003   CARDIOVERSION N/A 11/01/2013   Procedure: [place] COMPRESSION;  Surgeon: Lorretta Harp, MD;  Location: [facility] CATH LAB;  Service: Cardiovascular;  Laterality: N/A;   CAROTID ANGIOGRAM N/A 04/25/2013   Procedure: CAROTID ANGIOGRAM;  Surgeon: Lorretta Harp, MD;  Location: [facility] CATH LAB;  Service: Cardiovascular;  Laterality: N/A;   CAROTID ENDARTERECTOMY     COLON RESECTION  3/04, 6/04    1 bleeding diverticulitis, 2 complete colectomy   COLON SURGERY  2005   renal pouch rectal anastimosis   CORONARY ANGIOPLASTY WITH STENT PLACEMENT  09/2002   2 stents to RCA   ENDARTERECTOMY Left 06/02/2013   Procedure: ENDARTERECTOMY CAROTID-LEFT;  Surgeon: Serafina Mitchell, MD;  Location: [place];  Service: Vascular;  Laterality: Left;   INTRAVASCULAR PRESSURE WIRE/FFR STUDY N/A 04/02/2020   Procedure: INTRAVASCULAR PRESSURE WIRE/FFR STUDY;  Surgeon: Lorretta Harp, MD;  Location: [place] [facility];  Service: Cardiovascular;  Laterality: N/A;  DFR - RCA   LEFT HEART CATH AND CORONARY ANGIOGRAPHY N/A 04/02/2020   Procedure: LEFT HEART CATH AND CORONARY ANGIOGRAPHY;  Surgeon: Lorretta Harp, MD;  Location: [place] [facility];  Service: Cardiovascular;  Laterality: N/A;   LEFT HEART CATHETERIZATION WITH CORONARY ANGIOGRAM N/A 02/08/2013   Procedure: LEFT HEART CATHETERIZATION WITH CORONARY ANGIOGRAM;  Surgeon: Lorretta Harp, MD;  Location: [facility] CATH LAB;  Service: Cardiovascular;  Laterality: N/A;   LOWER EXTREMITY ANGIOGRAM N/A 10/20/2013  Procedure: LOWER EXTREMITY ANGIOGRAM;  Surgeon: Lorretta Harp, MD;  Location: [facility] CATH LAB;  Service: Cardiovascular;  Laterality: N/A;   PATCH ANGIOPLASTY Left 06/02/2013   Procedure: PATCH ANGIOPLASTY;  Surgeon: Serafina Mitchell, MD;  Location: [facility] OR;  Service: Vascular;  Laterality: Left;   PERIPHERAL VASCULAR INTERVENTION Bilateral 03/30/2018   Procedure: PERIPHERAL VASCULAR INTERVENTION;  Surgeon: Serafina Mitchell, MD;   Location: [place] [facility];  Service: Cardiovascular;  Laterality: Bilateral;  common iliacs   TONSILLECTOMY      Allergies  Allergies  Allergen Reactions   Fentanyl Other (See Comments)    Behavioral changes   Gabapentin Other (See Comments)    ankle swells   Lisinopril Other (See Comments)    unknown   Lyrica [Pregabalin] Other (See Comments)    unknown   Metformin Diarrhea   Propofol Other (See Comments)    Heart rate dropped    History of Present Illness    SULIMAN BRISCO has a PMH of coronary artery disease, sinus bradycardia, HTN, HLD, chronic diastolic CHF, tobacco abuse, obesity, and vertigo.  He underwent peripheral angiography 08/27/2020 which showed patent bilateral common iliac stents with right extrailiac artery stent.  He was noted to have 50-70% calcified stenosis in his left extrailiac artery and common femoral artery as well as high-grade diffuse stenosis (90%) distal right extrailiac artery and common femoral artery (99%) mid right SFA stenosis.  Dr. Alvester Chou plan to refer him back to Dr. Trula Slade for consideration of surgical revascularization and endarterectomy with patch angioplasty which did not occur.  He was admitted 99991111 with diastolic CHF and received diuresis.  He was admitted for 4 days.  On follow-up he reported watching his diet.  He was not on oral diuretic.  He was admitted to the hospital 08/05/2021 with fluid volume overload and acute on chronic diastolic CHF.  He received IV diuresis.  He was seen by Darrick Grinder, NP and Ellen Henri PA-C in the office in early December.  His diuresis was titrated.  He was admitted to the hospital on 99991111 with diastolic CHF due to medication noncompliance and dietary indiscretion.  He received 4 days of diuresis.  He was seen in follow-up by Ellen Henri PA-C in the heart failure clinic 12/09/2021.  He was seen in follow-up by Dr. Gwenlyn Found on 12/31/2021.  During that time he reported taking his diuretics  on a daily basis.  He also reported daily weights.  He no longer had resting dyspnea or peripheral edema.  He presents to the clinic today for follow-up evaluation states***  *** denies chest pain, shortness of breath, lower extremity edema, fatigue, palpitations, melena, hematuria, hemoptysis, diaphoresis, weakness, presyncope, syncope, orthopnea, and PND.  Peripheral arterial disease-activity level at baseline.  Denies lower extremity claudication.  Angiography 08/27/2020 showed patent common iliac artery stents on the right with 50-70% calcified stenosis of the left extrailiac artery and high-grade distal common femoral artery stenosis as well as 99% mid right SFA stenosis. Continue Plavix, atorvastatin Heart healthy low-sodium high-fiber diet Increase physical activity as tolerated  Atrial fibrillation-heart rate today***.  Denies recent episodes of accelerated or irregular heartbeat.  Cardiac unaware.  Reports compliance with apixaban and denies bleeding issues. Continue apixaban, metoprolol Heart healthy low-sodium diet-salty 6 given Increase physical activity as tolerated  Diastolic CHF-euvolemic today.  Weight stable.  NYHA class III. Continue Farxiga, furosemide, metoprolol, spironolactone, Entresto Heart healthy low-sodium diet Increase physical activity as tolerated Daily weights Follows with advanced heart failure clinic  Coronary artery disease-no chest pain today.  Denies exertional chest discomfort.  Underwent cardiac catheterization with PCI and DES to his RCA in 2003.  He underwent repeat cardiac catheterization in 2014 which showed 80-90% distal LAD and widely patent RCA.  Again underwent cardiac catheterization 04/02/2020 which showed patent RCA stent and intermediate lesion just beyond stented segment which was negative for DFR. Continue atorvastatin, Imdur, metoprolol Heart healthy low-sodium diet-salty 6 given Increase physical activity as tolerated  Essential  hypertension-BP today***. Continue spironolactone, Entresto, metoprolol, Imdur Heart healthy low-sodium diet-salty 6 given Increase physical activity as tolerated  Hyperlipidemia-LDL*** Continue atorvastatin Heart healthy low-sodium diet-salty 6 given Increase physical activity as tolerated   Disposition: Follow-up with Dr. Allyson Sabal in 4-6 months.  Home Medications    Prior to Admission medications   Medication Sig Start Date End Date Taking? Authorizing Provider  apixaban (ELIQUIS) 5 MG TABS tablet Take 1 tablet (5 mg total) by mouth 2 (two) times daily. 03/21/21   Angelita Ingles, MD  atorvastatin (LIPITOR) 40 MG tablet Take 40 mg by mouth daily. Patient not taking: Reported on 12/09/2021    [provider]  clopidogrel (PLAVIX) 75 MG tablet Take 1 tablet (75 mg total) by mouth daily. 01/04/21   Azucena Fallen, MD  dapagliflozin propanediol (FARXIGA) 10 MG TABS tablet Take 1 tablet (10 mg total) by mouth daily before breakfast. 04/25/21   Angelita Ingles, MD  escitalopram (LEXAPRO) 20 MG tablet Take 20 mg by mouth daily. 01/18/21   [provider]  furosemide (LASIX) 40 MG tablet Take 1.5 tablets (60 mg total) by mouth daily. 12/13/21   Robbie Lis M, PA-C  glipiZIDE (GLUCOTROL) 5 MG tablet Take 5 mg by mouth daily before breakfast.    [provider]  isosorbide mononitrate (IMDUR) 60 MG 24 hr tablet Take 2 tablets (120 mg total) by mouth daily. Patient taking differently: Take 60 mg by mouth daily. 08/09/21   Pokhrel, Rebekah [place], MD  levothyroxine (SYNTHROID) 25 MCG tablet TAKE ONE TABLET BY MOUTH DAILY BEFORE BREAKFAST 03/13/22   [name], MD  metoprolol tartrate (LOPRESSOR) 25 MG tablet Take 0.5 tablets (12.5 mg total) by mouth 2 (two) times daily. 08/29/21   Simmons, Brittainy M, PA-C  OZEMPIC, 0.25 OR 0.5 MG/DOSE, 2 MG/1.5ML SOPN Inject 0.5 mg into the skin once a week. 07/05/21   [provider]  pantoprazole (PROTONIX) 40 MG  tablet Take 40 mg by mouth every other day.    [provider]  Polyethyl Glycol-Propyl Glycol (SYSTANE FREE OP) Place 1 drop into both eyes daily as needed (dry eyes).    [provider]  sacubitril-valsartan (ENTRESTO) 97-103 MG Take 1 tablet by mouth 2 (two) times daily. 12/13/21   Allayne Butcher, PA-C  spironolactone (ALDACTONE) 25 MG tablet Take 1/2 tablet (12.5 mg total) by mouth daily. 08/09/21 12/09/21  Pokhrel, Rebekah Chesterfield, MD    Family History    Family History  Problem Relation Age of Onset   Heart disease Father        heart attack at 89   Heart attack Father    Hyperlipidemia Father    Colonic polyp Mother        that bled out   COPD Mother    Diabetes Mother    Heart disease Sister    Colonic polyp Sister    Stroke Maternal Grandfather    Heart attack Paternal Grandfather    Other Neg Hx  hypogonadism   He indicated that his mother is deceased. He indicated that his father is deceased. He indicated that his sister is alive. He indicated that his maternal grandmother is deceased. He indicated that his maternal grandfather is deceased. He indicated that his paternal grandmother is deceased. He indicated that his paternal grandfather is deceased. He indicated that the status of his neg hx is unknown.  Social History    Social History   Socioeconomic History   Marital status: Married    Spouse name: Leeroy Lovings   Number of children: 1   Years of education: Not on file   Highest education level: High school graduate  Occupational History   Occupation: retired  Tobacco Use   Smoking status: Every Day    Packs/day: 1.50    Years: 57.00    Total pack years: 85.50    Types: Cigarettes, E-cigarettes   Smokeless tobacco: Never   Tobacco comments:    pt states that he is using the vapor cigs  Vaping Use   Vaping Use: Every day   Start date: 09/15/2016   Substances: Nicotine  Substance and Sexual Activity   Alcohol use: Yes     Alcohol/week: 1.0 standard drink of alcohol    Types: 1 Glasses of wine per week    Comment: "very little"   Drug use: No   Sexual activity: Not on file  Other Topics Concern   Not on file  Social History Narrative   Not on file   Social Determinants of Health   Financial Resource Strain: Low Risk  (08/06/2021)   Overall Financial Resource Strain (CARDIA)    Difficulty of Paying Living Expenses: Not very hard  Recent Concern: Financial Resource Strain - Medium Risk (06/07/2021)   Overall Financial Resource Strain (CARDIA)    Difficulty of Paying Living Expenses: Somewhat hard  Food Insecurity: No Food Insecurity (08/06/2021)   Hunger Vital Sign    Worried About Running Out of Food in the Last Year: Never true    Ran Out of Food in the Last Year: Never true  Transportation Needs: No Transportation Needs (08/06/2021)   PRAPARE - Administrator, Civil Service (Medical): No    Lack of Transportation (Non-Medical): No  Physical Activity: Not on file  Stress: Not on file  Social Connections: Not on file  Intimate Partner Violence: Not on file     Review of Systems    General:  No chills, fever, night sweats or weight changes.  Cardiovascular:  No chest pain, dyspnea on exertion, edema, orthopnea, palpitations, paroxysmal nocturnal dyspnea. Dermatological: No rash, lesions/masses Respiratory: No cough, dyspnea Urologic: No hematuria, dysuria Abdominal:   No nausea, vomiting, diarrhea, bright red blood per rectum, melena, or hematemesis Neurologic:  No visual changes, wkns, changes in mental status. All other systems reviewed and are otherwise negative except as noted above.  Physical Exam    VS:  There were no vitals taken for this visit. , BMI There is no height or weight on file to calculate BMI. GEN: Well nourished, well developed, in no acute distress. HEENT: normal. Neck: Supple, no JVD, carotid bruits, or masses. Cardiac: RRR, no murmurs, rubs, or gallops. No  clubbing, cyanosis, edema.  Radials/DP/PT 2+ and equal bilaterally.  Respiratory:  Respirations regular and unlabored, clear to auscultation bilaterally. GI: Soft, nontender, nondistended, BS + x 4. MS: no deformity or atrophy. Skin: warm and dry, no rash. Neuro:  Strength and sensation are intact. Psych: Normal affect.  Accessory Clinical Findings    Recent Labs: 11/23/2021: ALT 27; Magnesium 2.0 11/27/2021: Hemoglobin 13.2; Platelets 221 12/09/2021: B Natriuretic Peptide 3,344.7; BUN 21; Creatinine, Ser 1.56; Potassium 4.6; Sodium 139; TSH 5.872   Recent Lipid Panel    Component Value Date/Time   CHOL 122 03/30/2020 1837   TRIG 85 03/30/2020 1837   HDL 53 03/30/2020 1837   CHOLHDL 2.3 03/30/2020 1837   VLDL 17 03/30/2020 1837   LDLCALC 52 03/30/2020 1837    ECG personally reviewed by me today- *** - No acute changes  Echocardiogram 11/25/2021 IMPRESSIONS     1. Left ventricular ejection fraction, by estimation, is 50 to 55%. The  left ventricle has low normal function. The left ventricle demonstrates  global hypokinesis. There is moderate concentric left ventricular  hypertrophy. Left ventricular diastolic  parameters are consistent with Grade I diastolic dysfunction (impaired  relaxation). Elevated left ventricular end-diastolic pressure.   2. Right ventricular systolic function is normal. The right ventricular  size is normal. There is normal pulmonary artery systolic pressure. The  estimated right ventricular systolic pressure is [id number] mmHg.   3. The mitral valve is degenerative. No evidence of mitral valve  regurgitation. No evidence of mitral stenosis.   4. The aortic valve is normal in structure. Aortic valve regurgitation is  not visualized. Aortic valve sclerosis/calcification is present, without  any evidence of aortic stenosis. Aortic valve area, by VTI measures 2.99  cm. Aortic valve mean gradient  measures 4.0 mmHg. Aortic valve Vmax measures 1.35 m/s.   5.  The inferior vena cava is normal in size with greater than 50%  respiratory variability, suggesting right atrial pressure of 3 mmHg.   Assessment & Plan   1.  ***   Jossie Ng. Dezra Mandella NP-C     04/05/2022, 12:19 PM [place] [address] Office [phone] Fax [phone]  Notice: This dictation was prepared with Dragon dictation along with smaller phrase technology. Any transcriptional errors that result from this process are unintentional and may not be corrected upon review.  I spent***minutes examining this patient, reviewing medications, and using patient centered shared decision making involving her cardiac care.  Prior to her visit I spent greater than 20 minutes reviewing her past medical history,  medications, and prior cardiac tests.

## 2022-04-07 ENCOUNTER — Ambulatory Visit: Payer: Medicare Other | Admitting: General Practice

## 2022-04-22 ENCOUNTER — Encounter: Payer: Self-pay | Admitting: General Practice

## 2022-05-13 ENCOUNTER — Inpatient Hospital Stay (HOSPITAL_COMMUNITY)
Admission: EM | Admit: 2022-05-13 | Discharge: 2022-05-21 | DRG: 286 | Disposition: A | Discharge status: Home Health | Payer: Medicare Other | Attending: Internal Medicine | Admitting: Internal Medicine

## 2022-05-13 ENCOUNTER — Other Ambulatory Visit: Payer: Self-pay

## 2022-05-13 ENCOUNTER — Emergency Department (HOSPITAL_COMMUNITY): Payer: Medicare Other

## 2022-05-13 DIAGNOSIS — R079 Chest pain, unspecified: Secondary | ICD-10-CM | POA: Diagnosis not present

## 2022-05-13 DIAGNOSIS — J189 Pneumonia, unspecified organism: Secondary | ICD-10-CM | POA: Diagnosis not present

## 2022-05-13 DIAGNOSIS — J449 Chronic obstructive pulmonary disease, unspecified: Secondary | ICD-10-CM | POA: Diagnosis not present

## 2022-05-13 DIAGNOSIS — K219 Gastro-esophageal reflux disease without esophagitis: Secondary | ICD-10-CM | POA: Diagnosis not present

## 2022-05-13 DIAGNOSIS — I4819 Other persistent atrial fibrillation: Secondary | ICD-10-CM

## 2022-05-13 DIAGNOSIS — Z823 Family history of stroke: Secondary | ICD-10-CM | POA: Diagnosis not present

## 2022-05-13 DIAGNOSIS — I5041 Acute combined systolic (congestive) and diastolic (congestive) heart failure: Secondary | ICD-10-CM | POA: Diagnosis present

## 2022-05-13 DIAGNOSIS — Z955 Presence of coronary angioplasty implant and graft: Secondary | ICD-10-CM

## 2022-05-13 DIAGNOSIS — N1831 Chronic kidney disease, stage 3a: Secondary | ICD-10-CM | POA: Diagnosis not present

## 2022-05-13 DIAGNOSIS — Z79899 Other long term (current) drug therapy: Secondary | ICD-10-CM

## 2022-05-13 DIAGNOSIS — Z83438 Family history of other disorder of lipoprotein metabolism and other lipidemia: Secondary | ICD-10-CM

## 2022-05-13 DIAGNOSIS — I110 Hypertensive heart disease with heart failure: Secondary | ICD-10-CM | POA: Diagnosis not present

## 2022-05-13 DIAGNOSIS — Z8249 Family history of ischemic heart disease and other diseases of the circulatory system: Secondary | ICD-10-CM

## 2022-05-13 DIAGNOSIS — N1832 Chronic kidney disease, stage 3b: Secondary | ICD-10-CM | POA: Diagnosis present

## 2022-05-13 DIAGNOSIS — E1151 Type 2 diabetes mellitus with diabetic peripheral angiopathy without gangrene: Secondary | ICD-10-CM | POA: Diagnosis present

## 2022-05-13 DIAGNOSIS — Z6831 Body mass index (BMI) 31.0-31.9, adult: Secondary | ICD-10-CM | POA: Diagnosis not present

## 2022-05-13 DIAGNOSIS — Z7984 Long term (current) use of oral hypoglycemic drugs: Secondary | ICD-10-CM

## 2022-05-13 DIAGNOSIS — E1122 Type 2 diabetes mellitus with diabetic chronic kidney disease: Secondary | ICD-10-CM | POA: Diagnosis not present

## 2022-05-13 DIAGNOSIS — I444 Left anterior fascicular block: Secondary | ICD-10-CM | POA: Diagnosis present

## 2022-05-13 DIAGNOSIS — E876 Hypokalemia: Secondary | ICD-10-CM | POA: Diagnosis not present

## 2022-05-13 DIAGNOSIS — J9811 Atelectasis: Secondary | ICD-10-CM | POA: Diagnosis present

## 2022-05-13 DIAGNOSIS — E782 Mixed hyperlipidemia: Secondary | ICD-10-CM | POA: Diagnosis not present

## 2022-05-13 DIAGNOSIS — R0902 Hypoxemia: Secondary | ICD-10-CM | POA: Diagnosis not present

## 2022-05-13 DIAGNOSIS — I10 Essential (primary) hypertension: Secondary | ICD-10-CM | POA: Diagnosis not present

## 2022-05-13 DIAGNOSIS — I4811 Longstanding persistent atrial fibrillation: Secondary | ICD-10-CM | POA: Diagnosis not present

## 2022-05-13 DIAGNOSIS — R000 Tachycardia, unspecified: Secondary | ICD-10-CM | POA: Diagnosis not present

## 2022-05-13 DIAGNOSIS — E039 Hypothyroidism, unspecified: Secondary | ICD-10-CM | POA: Diagnosis not present

## 2022-05-13 DIAGNOSIS — I420 Dilated cardiomyopathy: Secondary | ICD-10-CM | POA: Diagnosis not present

## 2022-05-13 DIAGNOSIS — I130 Hypertensive heart and chronic kidney disease with heart failure and stage 1 through stage 4 chronic kidney disease, or unspecified chronic kidney disease: Secondary | ICD-10-CM | POA: Diagnosis not present

## 2022-05-13 DIAGNOSIS — I169 Hypertensive crisis, unspecified: Secondary | ICD-10-CM | POA: Diagnosis present

## 2022-05-13 DIAGNOSIS — I248 Other forms of acute ischemic heart disease: Secondary | ICD-10-CM | POA: Diagnosis present

## 2022-05-13 DIAGNOSIS — R778 Other specified abnormalities of plasma proteins: Secondary | ICD-10-CM | POA: Diagnosis not present

## 2022-05-13 DIAGNOSIS — I480 Paroxysmal atrial fibrillation: Secondary | ICD-10-CM | POA: Diagnosis not present

## 2022-05-13 DIAGNOSIS — Z79890 Hormone replacement therapy (postmenopausal): Secondary | ICD-10-CM

## 2022-05-13 DIAGNOSIS — Z8371 Family history of colonic polyps: Secondary | ICD-10-CM

## 2022-05-13 DIAGNOSIS — I5033 Acute on chronic diastolic (congestive) heart failure: Secondary | ICD-10-CM

## 2022-05-13 DIAGNOSIS — I161 Hypertensive emergency: Secondary | ICD-10-CM | POA: Diagnosis present

## 2022-05-13 DIAGNOSIS — E1169 Type 2 diabetes mellitus with other specified complication: Secondary | ICD-10-CM | POA: Diagnosis present

## 2022-05-13 DIAGNOSIS — R0601 Orthopnea: Secondary | ICD-10-CM | POA: Diagnosis not present

## 2022-05-13 DIAGNOSIS — Z7901 Long term (current) use of anticoagulants: Secondary | ICD-10-CM

## 2022-05-13 DIAGNOSIS — I4891 Unspecified atrial fibrillation: Secondary | ICD-10-CM | POA: Diagnosis not present

## 2022-05-13 DIAGNOSIS — E669 Obesity, unspecified: Secondary | ICD-10-CM | POA: Diagnosis not present

## 2022-05-13 DIAGNOSIS — E785 Hyperlipidemia, unspecified: Secondary | ICD-10-CM | POA: Diagnosis not present

## 2022-05-13 DIAGNOSIS — Z833 Family history of diabetes mellitus: Secondary | ICD-10-CM

## 2022-05-13 DIAGNOSIS — N189 Chronic kidney disease, unspecified: Secondary | ICD-10-CM | POA: Diagnosis not present

## 2022-05-13 DIAGNOSIS — J9601 Acute respiratory failure with hypoxia: Secondary | ICD-10-CM

## 2022-05-13 DIAGNOSIS — F17210 Nicotine dependence, cigarettes, uncomplicated: Secondary | ICD-10-CM | POA: Diagnosis present

## 2022-05-13 DIAGNOSIS — I2510 Atherosclerotic heart disease of native coronary artery without angina pectoris: Secondary | ICD-10-CM | POA: Diagnosis present

## 2022-05-13 DIAGNOSIS — N179 Acute kidney failure, unspecified: Secondary | ICD-10-CM | POA: Diagnosis not present

## 2022-05-13 DIAGNOSIS — R0689 Other abnormalities of breathing: Secondary | ICD-10-CM | POA: Diagnosis not present

## 2022-05-13 DIAGNOSIS — I5043 Acute on chronic combined systolic (congestive) and diastolic (congestive) heart failure: Secondary | ICD-10-CM | POA: Diagnosis not present

## 2022-05-13 DIAGNOSIS — I509 Heart failure, unspecified: Secondary | ICD-10-CM | POA: Diagnosis not present

## 2022-05-13 DIAGNOSIS — Z825 Family history of asthma and other chronic lower respiratory diseases: Secondary | ICD-10-CM

## 2022-05-13 DIAGNOSIS — Z20822 Contact with and (suspected) exposure to covid-19: Secondary | ICD-10-CM | POA: Diagnosis present

## 2022-05-13 DIAGNOSIS — R0602 Shortness of breath: Secondary | ICD-10-CM | POA: Diagnosis not present

## 2022-05-13 DIAGNOSIS — Z7902 Long term (current) use of antithrombotics/antiplatelets: Secondary | ICD-10-CM

## 2022-05-13 DIAGNOSIS — Z888 Allergy status to other drugs, medicaments and biological substances status: Secondary | ICD-10-CM

## 2022-05-13 DIAGNOSIS — I5021 Acute systolic (congestive) heart failure: Secondary | ICD-10-CM | POA: Diagnosis not present

## 2022-05-13 DIAGNOSIS — I2699 Other pulmonary embolism without acute cor pulmonale: Secondary | ICD-10-CM | POA: Diagnosis not present

## 2022-05-13 LAB — CBC WITH DIFFERENTIAL/PLATELET
Abs Immature Granulocytes: 0.08 10*3/uL — ABNORMAL HIGH (ref 0.00–0.07)
Basophils Absolute: 0 10*3/uL (ref 0.0–0.1)
Basophils Relative: 0 %
Eosinophils Absolute: 0 10*3/uL (ref 0.0–0.5)
Eosinophils Relative: 0 %
HCT: 42.9 % (ref 39.0–52.0)
Hemoglobin: 14.4 g/dL (ref 13.0–17.0)
Immature Granulocytes: 1 %
Lymphocytes Relative: 6 %
Lymphs Abs: 0.7 10*3/uL (ref 0.7–4.0)
MCH: 30.1 pg (ref 26.0–34.0)
MCHC: 33.6 g/dL (ref 30.0–36.0)
MCV: 89.6 fL (ref 80.0–100.0)
Monocytes Absolute: 0.9 10*3/uL (ref 0.1–1.0)
Monocytes Relative: 8 %
Neutro Abs: 9 10*3/uL — ABNORMAL HIGH (ref 1.7–7.7)
Neutrophils Relative %: 85 %
Platelets: 264 10*3/uL (ref 150–400)
RBC: 4.79 MIL/uL (ref 4.22–5.81)
RDW: 13.8 % (ref 11.5–15.5)
WBC: 10.7 10*3/uL — ABNORMAL HIGH (ref 4.0–10.5)
nRBC: 0 % (ref 0.0–0.2)

## 2022-05-13 LAB — I-STAT CHEM 8, ED
BUN: 15 mg/dL (ref 8–23)
Calcium, Ion: 1.03 mmol/L — ABNORMAL LOW (ref 1.15–1.40)
Chloride: 104 mmol/L (ref 98–111)
Creatinine, Ser: 1.4 mg/dL — ABNORMAL HIGH (ref 0.61–1.24)
Glucose, Bld: 198 mg/dL — ABNORMAL HIGH (ref 70–99)
HCT: 45 % (ref 39.0–52.0)
Hemoglobin: 15.3 g/dL (ref 13.0–17.0)
Potassium: 3.5 mmol/L (ref 3.5–5.1)
Sodium: 137 mmol/L (ref 135–145)
TCO2: 21 mmol/L — ABNORMAL LOW (ref 22–32)

## 2022-05-13 LAB — COMPREHENSIVE METABOLIC PANEL
ALT: 14 U/L (ref 0–44)
AST: 14 U/L — ABNORMAL LOW (ref 15–41)
Albumin: 3.5 g/dL (ref 3.5–5.0)
Alkaline Phosphatase: 57 U/L (ref 38–126)
Anion gap: 11 (ref 5–15)
BUN: 12 mg/dL (ref 8–23)
CO2: 21 mmol/L — ABNORMAL LOW (ref 22–32)
Calcium: 9.2 mg/dL (ref 8.9–10.3)
Chloride: 105 mmol/L (ref 98–111)
Creatinine, Ser: 1.57 mg/dL — ABNORMAL HIGH (ref 0.61–1.24)
GFR, Estimated: 44 mL/min — ABNORMAL LOW (ref >60)
Glucose, Bld: 176 mg/dL — ABNORMAL HIGH (ref 70–99)
Potassium: 3.5 mmol/L (ref 3.5–5.1)
Sodium: 137 mmol/L (ref 135–145)
Total Bilirubin: 1 mg/dL (ref 0.3–1.2)
Total Protein: 6.5 g/dL (ref 6.5–8.1)

## 2022-05-13 LAB — PROCALCITONIN: Procalcitonin: <0.1 ng/mL

## 2022-05-13 LAB — BRAIN NATRIURETIC PEPTIDE: B Natriuretic Peptide: >4500 pg/mL — ABNORMAL HIGH (ref 0.0–100.0)

## 2022-05-13 LAB — TROPONIN I (HIGH SENSITIVITY)
Troponin I (High Sensitivity): 96 ng/L — ABNORMAL HIGH (ref <18)
Troponin I (High Sensitivity): 85 ng/L — ABNORMAL HIGH (ref <18)

## 2022-05-13 LAB — RESP PANEL BY RT-PCR (FLU A&B, COVID) ARPGX2
Influenza A by PCR: NEGATIVE
Influenza B by PCR: NEGATIVE
SARS Coronavirus 2 by RT PCR: NEGATIVE

## 2022-05-13 LAB — GLUCOSE, CAPILLARY: Glucose-Capillary: 180 mg/dL — ABNORMAL HIGH (ref 70–99)

## 2022-05-13 LAB — MAGNESIUM: Magnesium: 2 mg/dL (ref 1.7–2.4)

## 2022-05-13 LAB — LIPASE, BLOOD: Lipase: 24 U/L (ref 11–51)

## 2022-05-13 MED ORDER — ENOXAPARIN SODIUM 40 MG/0.4ML IJ SOSY: 40.0000 mg | PREFILLED_SYRINGE | INTRAMUSCULAR | Status: DC

## 2022-05-13 MED ORDER — NITROGLYCERIN IN D5W 200-5 MCG/ML-% IV SOLN
0.0000 ug/min | INTRAVENOUS | Status: DC
  Administered 2022-05-13: 5 ug/min via INTRAVENOUS
  Administered 2022-05-14: 100 ug/min via INTRAVENOUS
  Filled 2022-05-13 (×2): qty 250

## 2022-05-13 MED ORDER — ACETAMINOPHEN 325 MG PO TABS: 650.0000 mg | ORAL_TABLET | Freq: Four times a day (QID) | ORAL | Status: DC | PRN

## 2022-05-13 MED ORDER — FUROSEMIDE 10 MG/ML IJ SOLN
40.0000 mg | Freq: Once | INTRAMUSCULAR | Status: AC
  Administered 2022-05-13: 40 mg via INTRAVENOUS
  Filled 2022-05-13: qty 4

## 2022-05-13 MED ORDER — SODIUM CHLORIDE 0.9 % IV SOLN
500.0000 mg | INTRAVENOUS | Status: DC
  Administered 2022-05-14 – 2022-05-15 (×2): 500 mg via INTRAVENOUS
  Filled 2022-05-13 (×2): qty 5

## 2022-05-13 MED ORDER — DM-GUAIFENESIN ER 30-600 MG PO TB12
1.0000 | ORAL_TABLET | Freq: Two times a day (BID) | ORAL | Status: DC
  Administered 2022-05-13 – 2022-05-21 (×16): 1 via ORAL
  Filled 2022-05-13 (×16): qty 1

## 2022-05-13 MED ORDER — ACETAMINOPHEN 650 MG RE SUPP: 650.0000 mg | Freq: Four times a day (QID) | RECTAL | Status: DC | PRN

## 2022-05-13 MED ORDER — SODIUM CHLORIDE 0.9 % IV SOLN
1.0000 g | INTRAVENOUS | Status: DC
  Administered 2022-05-14 – 2022-05-15 (×2): 1 g via INTRAVENOUS
  Filled 2022-05-13 (×2): qty 10

## 2022-05-13 MED ORDER — INSULIN ASPART 100 UNIT/ML IJ SOLN
0.0000 [IU] | Freq: Every day | INTRAMUSCULAR | Status: DC
  Administered 2022-05-20: 2 [IU] via SUBCUTANEOUS

## 2022-05-13 MED ORDER — INSULIN ASPART 100 UNIT/ML IJ SOLN
0.0000 [IU] | Freq: Three times a day (TID) | INTRAMUSCULAR | Status: DC
  Administered 2022-05-14 (×2): 2 [IU] via SUBCUTANEOUS
  Administered 2022-05-16 – 2022-05-17 (×4): 1 [IU] via SUBCUTANEOUS
  Administered 2022-05-17 – 2022-05-18 (×2): 2 [IU] via SUBCUTANEOUS
  Administered 2022-05-18: 1 [IU] via SUBCUTANEOUS
  Administered 2022-05-19 – 2022-05-21 (×3): 2 [IU] via SUBCUTANEOUS

## 2022-05-13 MED ORDER — DILTIAZEM HCL-DEXTROSE 125-5 MG/125ML-% IV SOLN (PREMIX)
5.0000 mg/h | INTRAVENOUS | Status: DC
  Administered 2022-05-13: 5 mg/h via INTRAVENOUS
  Filled 2022-05-13: qty 125

## 2022-05-13 MED ORDER — APIXABAN 5 MG PO TABS
5.0000 mg | ORAL_TABLET | Freq: Two times a day (BID) | ORAL | Status: DC
  Administered 2022-05-13 – 2022-05-15 (×4): 5 mg via ORAL
  Filled 2022-05-13 (×4): qty 1

## 2022-05-13 MED ORDER — PANTOPRAZOLE SODIUM 40 MG PO TBEC
40.0000 mg | DELAYED_RELEASE_TABLET | ORAL | Status: DC
  Administered 2022-05-14 – 2022-05-20 (×4): 40 mg via ORAL
  Filled 2022-05-13 (×5): qty 1

## 2022-05-13 MED ORDER — DILTIAZEM LOAD VIA INFUSION
15.0000 mg | Freq: Once | INTRAVENOUS | Status: AC
  Administered 2022-05-13: 15 mg via INTRAVENOUS
  Filled 2022-05-13: qty 15

## 2022-05-13 MED ORDER — SODIUM CHLORIDE 0.9 % IV SOLN
1.0000 g | Freq: Once | INTRAVENOUS | Status: AC
  Administered 2022-05-13: 1 g via INTRAVENOUS
  Filled 2022-05-13: qty 10

## 2022-05-13 MED ORDER — SODIUM CHLORIDE 0.9 % IV SOLN
500.0000 mg | Freq: Once | INTRAVENOUS | Status: AC
  Administered 2022-05-13: 500 mg via INTRAVENOUS
  Filled 2022-05-13: qty 5

## 2022-05-13 MED ORDER — LEVOTHYROXINE SODIUM 25 MCG PO TABS
25.0000 ug | ORAL_TABLET | Freq: Every day | ORAL | Status: DC
  Administered 2022-05-14 – 2022-05-21 (×8): 25 ug via ORAL
  Filled 2022-05-13 (×8): qty 1

## 2022-05-13 MED ORDER — FUROSEMIDE 10 MG/ML IJ SOLN
40.0000 mg | Freq: Two times a day (BID) | INTRAMUSCULAR | Status: DC
  Administered 2022-05-14 – 2022-05-17 (×7): 40 mg via INTRAVENOUS
  Filled 2022-05-13 (×7): qty 4

## 2022-05-13 NOTE — ED Provider Notes (Signed)
[facility] EMERGENCY DEPARTMENT Provider Note   CSN: [id number] Arrival date & time: 05/13/22  1342     History  Chief Complaint  Patient presents with   afib RVR    Albert Powell is a 81 y.o. male.  Albert Powell is a 81 y.o. male with a history of hypertension, hyperlipidemia, COPD, CAD, CHF, A-fib, who presents to the emergency department via EMS for evaluation of shortness of breath, nausea and 1 episode of vomiting.  EMS found patient to be hypoxic at 88%, stabilized on 4 L nasal cannula with significant increased work of breathing with exertion when trying to walk back to his recliner.  Patient was found to have heart rate in the 130s with A-fib RVR on monitor and was found to be significantly hypertensive with systolic in the 240s upon arrival.  Patient reports he started feeling short of breath last night and has had progressively worsening dyspnea, primarily with exertion.  No chronic oxygen requirement.  Patient denies chest pain or pain.  Reports he was nauseated this morning and had 1 episode of vomiting, no hematemesis, no diarrhea or constipation.  Reports compliance with his medications but had not taken his medications this morning.  The history is provided by the patient, medical records and the EMS personnel.       Home Medications Prior to Admission medications   Medication Sig Start Date End Date Taking? Authorizing Provider  apixaban (ELIQUIS) 5 MG TABS tablet Take 1 tablet (5 mg total) by mouth 2 (two) times daily. 03/21/21  Yes Angelita Ingles, MD  clopidogrel (PLAVIX) 75 MG tablet Take 1 tablet (75 mg total) by mouth daily. 01/04/21  Yes Azucena Fallen, MD  dapagliflozin propanediol (FARXIGA) 10 MG TABS tablet Take 1 tablet (10 mg total) by mouth daily before breakfast. 04/25/21  Yes Winfrey, Kimberlee Nearing, MD  escitalopram (LEXAPRO) 20 MG tablet Take 20 mg by mouth daily. 01/18/21  Yes [provider]  furosemide (LASIX) 40 MG  tablet Take 1.5 tablets (60 mg total) by mouth daily. 12/13/21  Yes Simmons, Brittainy M, PA-C  glipiZIDE (GLUCOTROL) 5 MG tablet Take 5 mg by mouth daily before breakfast.   Yes [provider]  isosorbide mononitrate (IMDUR) 60 MG 24 hr tablet Take 2 tablets (120 mg total) by mouth daily. Patient taking differently: Take 60 mg by mouth daily. 08/09/21  Yes Pokhrel, Laxman, MD  levothyroxine (SYNTHROID) 25 MCG tablet TAKE ONE TABLET BY MOUTH DAILY BEFORE BREAKFAST 03/13/22  Yes [name], MD  metoprolol tartrate (LOPRESSOR) 25 MG tablet Take 0.5 tablets (12.5 mg total) by mouth 2 (two) times daily. 08/29/21  Yes Sharol Harness, Brittainy M, PA-C  pantoprazole (PROTONIX) 40 MG tablet Take 40 mg by mouth every other day.   Yes [provider]  sacubitril-valsartan (ENTRESTO) 97-103 MG Take 1 tablet by mouth 2 (two) times daily. 12/13/21  Yes Robbie Lis M, PA-C  spironolactone (ALDACTONE) 25 MG tablet Take 1/2 tablet (12.5 mg total) by mouth daily. 08/09/21 05/13/22 Yes Pokhrel, Laxman, MD      Allergies    Fentanyl, Gabapentin, Lisinopril, Lyrica [pregabalin], Metformin, and Propofol    Review of Systems   Review of Systems  Constitutional:  Positive for fatigue. Negative for chills and fever.  HENT: Negative.    Respiratory:  Positive for shortness of breath.   Cardiovascular:  Negative for chest pain, palpitations and leg swelling.  Gastrointestinal:  Positive for nausea and vomiting. Negative for abdominal  pain, blood in stool, constipation and diarrhea.  Genitourinary:  Negative for dysuria and frequency.  Musculoskeletal:  Negative for arthralgias and myalgias.    Physical Exam Updated Vital Signs BP (!) 179/102   Pulse 95   Temp 99.1 F (37.3 C) (Oral)   Resp 20   Ht 5\' 10"  (1.778 m)   Wt 99.8 kg   SpO2 94%   BMI 31.57 kg/m  Physical Exam Vitals and nursing note reviewed.  Constitutional:      General: He is not in acute distress.    Appearance:  Normal appearance. He is well-developed. He is not diaphoretic.  HENT:     Head: Normocephalic and atraumatic.  Eyes:     General:        Right eye: No discharge.        Left eye: No discharge.     Pupils: Pupils are equal, round, and reactive to light.  Cardiovascular:     Rate and Rhythm: Normal rate and regular rhythm.     Pulses: Normal pulses.     Heart sounds: Normal heart sounds.  Pulmonary:     Effort: Pulmonary effort is normal. No respiratory distress.     Breath sounds: Normal breath sounds. No wheezing or rales.     Comments: Respirations equal and unlabored, patient able to speak in full sentences, lungs clear to auscultation bilaterally  Abdominal:     General: Bowel sounds are normal. There is no distension.     Palpations: Abdomen is soft. There is no mass.     Tenderness: There is no abdominal tenderness. There is no guarding.     Comments: Abdomen soft, nondistended, nontender to palpation in all quadrants without guarding or peritoneal signs  Musculoskeletal:        General: No deformity.     Cervical back: Neck supple.  Skin:    General: Skin is warm and dry.     Capillary Refill: Capillary refill takes less than 2 seconds.  Neurological:     Mental Status: He is alert and oriented to person, place, and time.     Coordination: Coordination normal.     Comments: Speech is clear, able to follow commands CN III-XII intact Normal strength in upper and lower extremities bilaterally including dorsiflexion and plantar flexion, strong and equal grip strength Sensation normal to light and sharp touch Moves extremities without ataxia, coordination intact  Psychiatric:        Mood and Affect: Mood normal.        Behavior: Behavior normal.     ED Results / Procedures / Treatments   Labs (all labs ordered are listed, but only abnormal results are displayed) Labs Reviewed  COMPREHENSIVE METABOLIC PANEL - Abnormal; Notable for the following components:      Result  Value   CO2 21 (*)    Glucose, Bld 176 (*)    Creatinine, Ser 1.57 (*)    AST 14 (*)    GFR, Estimated 44 (*)    All other components within normal limits  CBC WITH DIFFERENTIAL/PLATELET - Abnormal; Notable for the following components:   WBC 10.7 (*)    Neutro Abs 9.0 (*)    Abs Immature Granulocytes 0.08 (*)    All other components within normal limits  BRAIN NATRIURETIC PEPTIDE - Abnormal; Notable for the following components:   B Natriuretic Peptide >4,500.0 (*)    All other components within normal limits  I-STAT CHEM 8, ED - Abnormal; Notable for the following  components:   Creatinine, Ser 1.40 (*)    Glucose, Bld 198 (*)    Calcium, Ion 1.03 (*)    TCO2 21 (*)    All other components within normal limits  TROPONIN I (HIGH SENSITIVITY) - Abnormal; Notable for the following components:   Troponin I (High Sensitivity) 96 (*)    All other components within normal limits  RESP PANEL BY RT-PCR (FLU A&B, COVID) ARPGX2  CULTURE, BLOOD (ROUTINE X 2)  CULTURE, BLOOD (ROUTINE X 2)  LIPASE, BLOOD  MAGNESIUM  PROCALCITONIN  TROPONIN I (HIGH SENSITIVITY)    EKG EKG Interpretation  Date/Time:  Tuesday May 13 2022 13:52:47 EDT Ventricular Rate:  122 PR Interval:    QRS Duration: 75 QT Interval:  354 QTC Calculation: 505 R Axis:   -63 Text Interpretation: Atrial fibrillation Ventricular premature complex Left anterior fascicular block Anterior infarct, old Prolonged QT interval Confirmed by Gloris Manchester [phone]) on 05/13/2022 1:54:43 PM  Radiology DG Chest Port 1 View  Result Date: 05/13/2022 CLINICAL DATA:  Shortness of breath, atrial fibrillation with rapid ventricular response EXAM: PORTABLE CHEST 1 VIEW COMPARISON:  Portable exam 1404 hours compared to 11/23/2021 FINDINGS: Enlargement of cardiac silhouette. Mediastinal contours and pulmonary vascularity normal. Atherosclerotic calcification aorta. Bibasilar consolidation favor multifocal pneumonia over are locked assist or  edema. Upper lungs clear. No pleural effusion or pneumothorax. Intraspinal stimulator noted. IMPRESSION: Enlargement of cardiac silhouette with bibasilar infiltrates favoring multifocal pneumonia as above. Aortic Atherosclerosis (ICD10-I70.0). Electronically Signed   By: Ulyses Southward M.D.   On: 05/13/2022 14:17    Procedures .Critical Care  Performed by: Dartha Lodge, PA-C Authorized by: Dartha Lodge, PA-C   Critical care provider statement:    Critical care time (minutes):  60   Critical care was necessary to treat or prevent imminent or life-threatening deterioration of the following conditions:  Cardiac failure and respiratory failure   Critical care was time spent personally by me on the following activities:  Development of treatment plan with patient or surrogate, discussions with consultants, evaluation of patient's response to treatment, examination of patient, ordering and review of laboratory studies, ordering and review of radiographic studies, ordering and performing treatments and interventions, pulse oximetry, re-evaluation of patient's condition and review of old charts     Medications Ordered in ED Medications  diltiazem (CARDIZEM) 1 mg/mL load via infusion 15 mg (15 mg Intravenous Bolus from Bag 05/13/22 1413)    And  diltiazem (CARDIZEM) 125 mg in dextrose 5% 125 mL (1 mg/mL) infusion (5 mg/hr Intravenous New Bag/Given 05/13/22 1413)  cefTRIAXone (ROCEPHIN) 1 g in sodium chloride 0.9 % 100 mL IVPB (0 g Intravenous Stopped 05/13/22 1805)  azithromycin (ZITHROMAX) 500 mg in sodium chloride 0.9 % 250 mL IVPB (0 mg Intravenous Stopped 05/13/22 1837)  furosemide (LASIX) injection 40 mg (40 mg Intravenous Given 05/13/22 1653)    ED Course/ Medical Decision Making/ A&P                           Medical Decision Making Amount and/or Complexity of Data Reviewed Labs: ordered. Radiology: ordered.  Risk Prescription drug management. Decision regarding  hospitalization.   Albert Powell is a 81 y.o. male presents to the ED for concern of shortness of breath, A-fib RVR, this involves an extensive number of treatment options, and is a complaint that carries with it a high risk of complications and morbidity.  The differential diagnosis includes CHF exacerbation, pulmonary  edema, arrhythmia, pneumonia, COVID   Additional history obtained:  Additional history obtained from EMS personnel External records from outside source obtained and reviewed including most recent discharge summary from admission for hypertensive emergency, prior labs and chest x-ray reviewed   Lab Tests:  I Ordered, reviewed, and interpreted labs.  The pertinent results include: Minimal leukocytosis, normal hemoglobin, creatinine at baseline, glucose 176 but no other significant electrolyte derangements, troponin of 96, BNP significantly elevated at 4500, blood cultures and procalcitonin sent.   Imaging Studies ordered:  I ordered imaging studies including chest x-ray I independently visualized and interpreted imaging which showed enlargement of cardiac silhouette with bibasilar infiltrates favoring multifocal pneumonia versus edema  I agree with the radiologist interpretation   Cardiac Monitoring:  The patient was maintained on a cardiac monitor.  I personally viewed and interpreted the cardiac monitored which showed an underlying rhythm of: afib RVR   Medicines ordered and prescription drug management:  I ordered medication including Cardizem load and infusion for A-fib RVR, IV Lasix for potential CHF exacerbation and pulmonary edema, Rocephin and azithromycin for potential multifocal pneumonia  I have reviewed the patients home medicines and have made adjustments as needed   Critical Interventions:  Hypertension and tachycardia improving with IV Cardizem and Lasix, work of breathing remains stable on 4 L nasal cannula   ED Course:  Patient is critically  ill with multiple vital sign derangements, tachycardic in A-fib RVR and hypertensive, chest x-ray with potential multifocal pneumonia versus pulmonary edema.  Patient with new oxygen requirement, still remains alert and mentating well. Started on Cardizem, given Lasix as well as antibiotics for potential pneumonia coverage.  Will require admission for continued treatment.   Consultations Obtained:  I requested consultation with the hospitalist,  and discussed lab and imaging findings as well as pertinent plan - Dr.  Thomes Dinning will see and admit the patient   Dispostion:  After consideration of the diagnostic results and the patients response to treatment feel that the patent would benefit from admission.          Final Clinical Impression(s) / ED Diagnoses Final diagnoses:  Acute respiratory failure with hypoxia ([name])  Acute on chronic congestive heart failure, unspecified heart failure type [facility])  Atrial fibrillation with RVR [facility])    Rx / DC Orders ED Discharge Orders     None         Legrand Rams 06/03/22 1314    Gloris Manchester, MD 06/04/22 [phone]

## 2022-05-13 NOTE — ED Notes (Signed)
Patient continues to removed  and oxygen saturation drops. Patient informed about the importance of keeping it in to keep his oxygen saturation from dropping.

## 2022-05-13 NOTE — ED Notes (Signed)
ED TO INPATIENT HANDOFF REPORT  ED Nurse Name and Phone #: [phone]  S Name/Age/Gender Albert Powell 81 y.o. male Room/Bed: 010C/010C  Code Status   Code Status: Prior  Home/SNF/Other Home Patient oriented to: self, place, time, and situation Is this baseline? Yes   Triage Complete: Triage complete  Chief Complaint Acute exacerbation of CHF (congestive heart failure) ([name]) [I50.9]  Triage Note BIB [facility] after pt called to report increase SHOB. Per EMS, pt having increase SHOB with exertion and also having HR in 130's. Pt placed on 4 L [place] on scene.   HX: CHF, Afib   Allergies Allergies  Allergen Reactions   Fentanyl Other (See Comments)    Behavioral changes   Gabapentin Other (See Comments)    ankle swells   Lisinopril Other (See Comments)    unknown   Lyrica [Pregabalin] Other (See Comments)    unknown   Metformin Diarrhea   Propofol Other (See Comments)    Heart rate dropped    Level of Care/Admitting Diagnosis ED Disposition     ED Disposition  Admit   Condition  --   Comment  Hospital [facility]: [name] [[id number]]  Level of Care: Telemetry Cardiac [[id number]]  May admit patient to [facility] or [facility] if equivalent level of care is available:: Yes  Covid Evaluation: Asymptomatic - no recent exposure (last 10 days) testing not required  Diagnosis: Acute exacerbation of CHF (congestive heart failure) [facility] [[id number]]  Admitting Physician: Frankey Shown [[id number]]  Attending Physician: Frankey Shown [[id number]]  Certification:: I certify this patient will need inpatient services for at least 2 midnights  Estimated Length of Stay: 3          B Medical/Surgery History Past Medical History:  Diagnosis Date   Atrial fibrillation ([name]) 03/30/2020   Bradycardia, sinus 02/18/2013   CAD (coronary artery disease)    stent to RCA 2003 also had 40% lesion at that time; NUCLEAR STRESS TEST, 01/16/2010 - normal  now with cath 80-90%  stenosis in LAD, will try medical therapy if no improvement PCI   Carotid artery disease ([name])    Cataract    CHF (congestive heart failure) ([name])    Claudication ([name])    LEA DUPLEX, 07/07/2008 - Normal   COPD (chronic obstructive pulmonary disease) ([name])    DDD (degenerative disc disease) 2008   Diabetes mellitus    GERD (gastroesophageal reflux disease)    H/O hiatal hernia    History of colonoscopy 2004   finding of tics and AMV only no polpys   Hyperlipidemia 02/05/2013   Hypertension    Onychomycosis    Rosacea    Tobacco abuse 02/05/2013   Past Surgical History:  Procedure Laterality Date   ABDOMINAL AORTOGRAM W/LOWER EXTREMITY N/A 03/30/2018   Procedure: ABDOMINAL AORTOGRAM W/LOWER EXTREMITY;  Surgeon: Nada Libman, MD;  [facility]: [facility] INVAS[facility];  Service: Cardiovascular;  Laterality: N/A;   ABDOMINAL AORTOGRAM W/LOWER EXTREMITY Bilateral 08/27/2020   Procedure: ABDOMINAL AORTOGRAM W/LOWER EXTREMITY;  Surgeon: [name], MD;  Location: [facility];  Service: Cardiovascular;  Laterality: Bilateral;   APPENDECTOMY     CARDIAC CATHETERIZATION  08/29/2002   2-vessel CAD with high-grade stenoses in RCA; stenting to RCA   CARDIAC CATHETERIZATION  2014   80-90% lesion will try medical therapy   CARDIAC SURGERY     stents  2003   CARDIOVERSION N/A 11/01/2013   Procedure: [place] COMPRESSION;  Surgeon: [name], MD;  Location: [facility] CATH LAB;  Service: Cardiovascular;  Laterality: N/A;   CAROTID ANGIOGRAM N/A 04/25/2013   Procedure: CAROTID ANGIOGRAM;  Surgeon: [name], MD;  Location: [facility] CATH LAB;  Service: Cardiovascular;  Laterality: N/A;   CAROTID ENDARTERECTOMY     COLON RESECTION  3/04, 6/04    1 bleeding diverticulitis, 2 complete colectomy   COLON SURGERY  2005   renal pouch rectal anastimosis   CORONARY ANGIOPLASTY WITH STENT PLACEMENT  09/2002   2 stents to RCA   ENDARTERECTOMY Left 06/02/2013   Procedure: ENDARTERECTOMY  CAROTID-LEFT;  Surgeon: Nada Libman, MD;  Location: [facility] OR;  Service: Vascular;  Laterality: Left;   INTRAVASCULAR PRESSURE WIRE/FFR STUDY N/A 04/02/2020   Procedure: INTRAVASCULAR PRESSURE WIRE/FFR STUDY;  Surgeon: [name], MD;  [facility]: [facility];  Service: Cardiovascular;  Laterality: N/A;  DFR - RCA   LEFT HEART CATH AND CORONARY ANGIOGRAPHY N/A 04/02/2020   Procedure: LEFT HEART CATH AND CORONARY ANGIOGRAPHY;  Surgeon: [name], MD;  [facility];  Service: Cardiovascular;  Laterality: N/A;   LEFT HEART CATHETERIZATION WITH CORONARY ANGIOGRAM N/A 02/08/2013   Procedure: LEFT HEART CATHETERIZATION WITH CORONARY ANGIOGRAM;  Surgeon: [name], MD;  Location: [facility] CATH LAB;  Service: Cardiovascular;  Laterality: N/A;   LOWER EXTREMITY ANGIOGRAM N/A 10/20/2013   Procedure: LOWER EXTREMITY ANGIOGRAM;  Surgeon: [name], MD;  [facility] C[facility] LAB;  Service: Cardiovascular;  Laterality: N/A;   PATCH ANGIOPLASTY Left 06/02/2013   Procedure: PATCH ANGIOPLASTY;  Surgeon: Nada Libman, MD;  Location: [facility] OR;  Service: Vascular;  Laterality: Left;   PERIPHERAL VASCULAR INTERVENTION Bilateral 03/30/2018   Procedure: PERIPHERAL VASCULAR INTERVENTION;  Surgeon: Nada Libman, MD;  [facility];  Service: Cardiovascular;  Laterality: Bilateral;  common iliacs   TONSILLECTOMY       A IV Location/Drains/Wounds Patient Lines/Drains/Airways Status     Active Line/Drains/Airways     Name Placement date Placement time Site Days   Peripheral IV 05/13/22 20 G Left;Posterior Hand 05/13/22  1735  Hand  less than 1   External Urinary Catheter 05/13/22  1806  --  less than 1            Intake/Output Last 24 hours  Intake/Output Summary (Last 24 hours) at 05/13/2022 1942 Last data filed at 05/13/2022 1837 Gross per 24 hour  Intake 352.26 ml  Output --  Net 352.26 ml    Labs/Imaging Results for orders placed or performed  during the hospital encounter of 05/13/22 (from the past 48 hour(s))  Resp Panel by RT-PCR (Flu A&B, Covid) Anterior Nasal Swab     Status: None   Collection Time: 05/13/22  2:26 PM   Specimen: Anterior Nasal Swab  Result Value Ref Range   SARS Coronavirus 2 by RT PCR NEGATIVE NEGATIVE    Comment: (NOTE) SARS-CoV-2 target nucleic acids are NOT DETECTED.  The SARS-CoV-2 RNA is generally detectable in upper respiratory specimens during the acute phase of infection. The lowest concentration of SARS-CoV-2 viral copies this assay can detect is 138 copies/mL. A negative result does not preclude SARS-Cov-2 infection and should not be used as the sole basis for treatment or other patient management decisions. A negative result may occur with  improper specimen collection/handling, submission of specimen other than nasopharyngeal swab, presence of viral mutation(s) within the areas targeted by this assay, and inadequate number of viral copies(<138 copies/mL). A negative result must be combined  with clinical observations, patient history, and epidemiological information. The expected result is Negative.  Fact Sheet for Patients:  EntrepreneurPulse.com.au  Fact Sheet for Healthcare Providers:  IncredibleEmployment.be  This test is no t yet approved or cleared by the [place] FDA and  has been authorized for detection and/or diagnosis of SARS-CoV-2 by FDA under an Emergency Use Authorization (EUA). This EUA will remain  in effect (meaning this test can be used) for the duration of the COVID-19 declaration under Section 564(b)(1) of the Act, 21 U.S.C.section 360bbb-3(b)(1), unless the authorization is terminated  or revoked sooner.       Influenza A by PCR NEGATIVE NEGATIVE   Influenza B by PCR NEGATIVE NEGATIVE    Comment: (NOTE) The Xpert Xpress SARS-CoV-2/FLU/RSV plus assay is intended as an aid in the diagnosis of influenza from Nasopharyngeal  swab specimens and should not be used as a sole basis for treatment. Nasal washings and aspirates are unacceptable for Xpert Xpress SARS-CoV-2/FLU/RSV testing.  Fact Sheet for Patients: EntrepreneurPulse.com.au  Fact Sheet for Healthcare Providers: IncredibleEmployment.be  This test is not yet approved or cleared by the [place] FDA and has been authorized for detection and/or diagnosis of SARS-CoV-2 by FDA under an Emergency Use Authorization (EUA). This EUA will remain in effect (meaning this test can be used) for the duration of the COVID-19 declaration under Section 564(b)(1) of the Act, 21 U.S.C. section 360bbb-3(b)(1), unless the authorization is terminated or revoked.  Performed at [facility], [place] [address] [place], [place] [address]   Comprehensive metabolic panel     Status: Abnormal   Collection Time: 05/13/22  4:27 PM  Result Value Ref Range   Sodium 137 135 - 145 mmol/L   Potassium 3.5 3.5 - 5.1 mmol/L   Chloride 105 98 - 111 mmol/L   CO2 21 (L) 22 - 32 mmol/L   Glucose, Bld 176 (H) 70 - 99 mg/dL    Comment: Glucose reference range applies only to samples taken after fasting for at least 8 hours.   BUN 12 8 - 23 mg/dL   Creatinine, Ser 1.57 (H) 0.61 - 1.24 mg/dL   Calcium 9.2 8.9 - 10.3 mg/dL   Total Protein 6.5 6.5 - 8.1 g/dL   Albumin 3.5 3.5 - 5.0 g/dL   AST 14 (L) 15 - 41 U/L   ALT 14 0 - 44 U/L   Alkaline Phosphatase 57 38 - 126 U/L   Total Bilirubin 1.0 0.3 - 1.2 mg/dL   GFR, Estimated 44 (L) >60 mL/min    Comment: (NOTE) Calculated using the CKD-EPI Creatinine Equation (2021)    Anion gap 11 5 - 15    Comment: Performed at [facility] [address], [place], [place] [address]  Lipase, blood     Status: None   Collection Time: 05/13/22  4:27 PM  Result Value Ref Range   Lipase 24 11 - 51 U/L    Comment: Performed at [facility], [place] [address], [place], [place] [address]  Troponin I  (High Sensitivity)     Status: Abnormal   Collection Time: 05/13/22  4:27 PM  Result Value Ref Range   Troponin I (High Sensitivity) 96 (H) <18 ng/L    Comment: (NOTE) Elevated high sensitivity troponin I (hsTnI) values and significant  changes across serial measurements may suggest ACS but many other  chronic and acute conditions are known to elevate hsTnI results.  Refer to the "Links" section for chest pain  algorithms and additional  guidance. Performed at [facility], [place] [address] [place], [place] [address]   CBC with Differential     Status: Abnormal   Collection Time: 05/13/22  4:27 PM  Result Value Ref Range   WBC 10.7 (H) 4.0 - 10.5 K/uL   RBC 4.79 4.22 - 5.81 MIL/uL   Hemoglobin 14.4 13.0 - 17.0 g/dL   HCT 42.9 39.0 - 52.0 %   MCV 89.6 80.0 - 100.0 fL   MCH 30.1 26.0 - 34.0 pg   MCHC 33.6 30.0 - 36.0 g/dL   RDW 13.8 11.5 - 15.5 %   Platelets 264 150 - 400 K/uL   nRBC 0.0 0.0 - 0.2 %   Neutrophils Relative % 85 %   Neutro Abs 9.0 (H) 1.7 - 7.7 K/uL   Lymphocytes Relative 6 %   Lymphs Abs 0.7 0.7 - 4.0 K/uL   Monocytes Relative 8 %   Monocytes Absolute 0.9 0.1 - 1.0 K/uL   Eosinophils Relative 0 %   Eosinophils Absolute 0.0 0.0 - 0.5 K/uL   Basophils Relative 0 %   Basophils Absolute 0.0 0.0 - 0.1 K/uL   Immature Granulocytes 1 %   Abs Immature Granulocytes 0.08 (H) 0.00 - 0.07 K/uL    Comment: Performed at [facility] [address], [place], [place] [address]  Brain natriuretic peptide     Status: Abnormal   Collection Time: 05/13/22  4:27 PM  Result Value Ref Range   B Natriuretic Peptide >4,500.0 (H) 0.0 - 100.0 pg/mL    Comment: Performed at [facility] [address], [place], [place] [address]  Magnesium     Status: None   Collection Time: 05/13/22  4:27 PM  Result Value Ref Range   Magnesium 2.0 1.7 - 2.4 mg/dL    Comment: Performed at [facility], [place] [address] [place],  [address]  Procalcitonin - Baseline      Status: None   Collection Time: 05/13/22  4:27 PM  Result Value Ref Range   Procalcitonin <0.10 ng/mL    Comment:        Interpretation: PCT (Procalcitonin) <= 0.5 ng/mL: Systemic infection (sepsis) is not likely. Local bacterial infection is possible. (NOTE)       Sepsis PCT Algorithm           Lower Respiratory Tract                                      Infection PCT Algorithm    ----------------------------     ----------------------------         PCT < 0.25 ng/mL                PCT < 0.10 ng/mL          Strongly encourage             Strongly discourage   discontinuation of antibiotics    initiation of antibiotics    ----------------------------     -----------------------------       PCT 0.25 - 0.50 ng/mL            PCT 0.10 - 0.25 ng/mL               OR       >80% decrease in PCT            Discourage  initiation of                                            antibiotics      Encourage discontinuation           of antibiotics    ----------------------------     -----------------------------         PCT >= 0.50 ng/mL              PCT 0.26 - 0.50 ng/mL               AND        <80% decrease in PCT             Encourage initiation of                                             antibiotics       Encourage continuation           of antibiotics    ----------------------------     -----------------------------        PCT >= 0.50 ng/mL                  PCT > 0.50 ng/mL               AND         increase in PCT                  Strongly encourage                                      initiation of antibiotics    Strongly encourage escalation           of antibiotics                                     -----------------------------                                           PCT <= 0.25 ng/mL                                                 OR                                        > 80% decrease in PCT                                      Discontinue / Do not initiate  antibiotics  Performed at [facility], [place] [address] [place], [place] [address]   I-stat chem 8, ED (not at [facility] or [facility])     Status: Abnormal   Collection Time: 05/13/22  4:39 PM  Result Value Ref Range   Sodium 137 135 - 145 mmol/L   Potassium 3.5 3.5 - 5.1 mmol/L   Chloride 104 98 - 111 mmol/L   BUN 15 8 - 23 mg/dL   Creatinine, Ser 1.40 (H) 0.61 - 1.24 mg/dL   Glucose, Bld 198 (H) 70 - 99 mg/dL    Comment: Glucose reference range applies only to samples taken after fasting for at least 8 hours.   Calcium, Ion 1.03 (L) 1.15 - 1.40 mmol/L   TCO2 21 (L) 22 - 32 mmol/L   Hemoglobin 15.3 13.0 - 17.0 g/dL   HCT 45.0 39.0 - 52.0 %   DG Chest Port 1 View  Result Date: 05/13/2022 CLINICAL DATA:  Shortness of breath, atrial fibrillation with rapid ventricular response EXAM: PORTABLE CHEST 1 VIEW COMPARISON:  Portable exam 1404 hours compared to 11/23/2021 FINDINGS: Enlargement of cardiac silhouette. Mediastinal contours and pulmonary vascularity normal. Atherosclerotic calcification aorta. Bibasilar consolidation favor multifocal pneumonia over are locked assist or edema. Upper lungs clear. No pleural effusion or pneumothorax. Intraspinal stimulator noted. IMPRESSION: Enlargement of cardiac silhouette with bibasilar infiltrates favoring multifocal pneumonia as above. Aortic Atherosclerosis (ICD10-I70.0). Electronically Signed   By: Lavonia Dana M.D.   On: 05/13/2022 14:17    Pending Labs Unresulted Labs (From admission, onward)     Start     Ordered   05/13/22 1426  Blood culture (routine x 2)  BLOOD CULTURE X 2,   R      05/13/22 1425            Vitals/Pain Today's Vitals   05/13/22 1730 05/13/22 1804 05/13/22 1848 05/13/22 1852  BP: (!) 164/97  (!) 179/102   Pulse: 69  95   Resp: 20  20   Temp:  99.1 F (37.3 C)    TempSrc:  Oral    SpO2: 93%  94%   Weight:    99.8 kg  Height:    5\' 10"  (1.778 m)  PainSc:        Isolation  Precautions No active isolations  Medications Medications  diltiazem (CARDIZEM) 1 mg/mL load via infusion 15 mg (15 mg Intravenous Bolus from Bag 05/13/22 1413)    And  diltiazem (CARDIZEM) 125 mg in dextrose 5% 125 mL (1 mg/mL) infusion (5 mg/hr Intravenous New Bag/Given 05/13/22 1413)  cefTRIAXone (ROCEPHIN) 1 g in sodium chloride 0.9 % 100 mL IVPB (0 g Intravenous Stopped 05/13/22 1805)  azithromycin (ZITHROMAX) 500 mg in sodium chloride 0.9 % 250 mL IVPB (0 mg Intravenous Stopped 05/13/22 1837)  furosemide (LASIX) injection 40 mg (40 mg Intravenous Given 05/13/22 1653)    Mobility walks with person assist Low fall risk   Focused Assessments Pulmonary Assessment Handoff:  Lung sounds:   O2 Device: Nasal Cannula O2 Flow Rate (L/min): 2 L/min    R Recommendations: See Admitting Provider Note  Report given to:   Additional Notes:   Cardizem drip @ 12.5/hr

## 2022-05-13 NOTE — ED Triage Notes (Signed)
BIB [facility] after pt called to report increase SHOB. Per EMS, pt having increase SHOB with exertion and also having HR in 130's. Pt placed on 4 L [place] on scene.   HX: CHF, Afib

## 2022-05-13 NOTE — H&P (Signed)
History and Physical    Patient: Albert Powell [id number] DOB: 1941/06/18 DOA: 05/13/2022 DOS: the patient was seen and examined on 05/13/2022 PCP: Leeroy Cha, MD  Patient coming from: Home  Chief Complaint:  Chief Complaint  Patient presents with   afib RVR   HPI: [name] is a 81 y.o. male with medical history significant of CAD, chronic diastolic CHF, essential hypertension, hyperlipidemia, A-fib, COPD and T2DM who presents to the emergency department due to several day onset of increasing shortness of breath which worsens with exertion.  Shortness of breath worsened today, EMS was activated, and on arrival of EMS team, O2 sat was noted to be at 88%, this improved on supplemental oxygen via [place] at 4 LPM, however patient continues to have increased work of breathing on exertion, he was also noted to be in A-fib with RVR with HR in the 130s, BP was elevated with systolic in the 0000000.  He endorsed nausea this morning and vomited (nonbloody) once.  Patient denies chest pain, fever, chills, orthopnea, chest congestion, abdominal pain, increased leg swelling or worsening abdominal distention.   ED Course:  In the emergency department, he was tachypneic and tachycardic, BP was 192/130, temperature 99.42F, O2 sats 93-94% on supplemental oxygen via [place] at 2 LPM.  Work-up in the ED showed normal CBC except for WBC of 10.7.  BMP was normal except for CO2 of 21, glucose 176, creatinine 1.57 (baseline creatinine at 1.4-1.7).  Magnesium 2.0, troponin x1- 96 (chronically elevated; this was 91 on 08/05/2021), lipase 24, BNP> 4500, procalcitonin < 0.10.  Influenza A, B, SARS coronavirus 2 was negative. Chest x-ray showed enlargement of cardiac silhouette with bibasilar infiltrates favoring multifocal pneumonia. Patient was empirically treated with IV ceftriaxone and azithromycin, IV Cardizem drip due to A-fib was started, IV Lasix 40 mg x 1 was given.  Hospitalist was asked to admit  patient for further evaluation and management.   Review of Systems: Review of systems as noted in the HPI. All other systems reviewed and are negative.   Past Medical History:  Diagnosis Date   Atrial fibrillation ([place]) 03/30/2020   Bradycardia, sinus 02/18/2013   CAD (coronary artery disease)    stent to RCA 2003 also had 40% lesion at that time; NUCLEAR STRESS TEST, 01/16/2010 - normal  now with cath 80-90% stenosis in LAD, will try medical therapy if no improvement PCI   Carotid artery disease ([name])    Cataract    CHF (congestive heart failure) ([place])    Claudication ([place])    LEA DUPLEX, 07/07/2008 - Normal   COPD (chronic obstructive pulmonary disease) ([place])    DDD (degenerative disc disease) 2008   Diabetes mellitus    GERD (gastroesophageal reflux disease)    H/O hiatal hernia    History of colonoscopy 2004   finding of tics and AMV only no polpys   Hyperlipidemia 02/05/2013   Hypertension    Onychomycosis    Rosacea    Tobacco abuse 02/05/2013   Past Surgical History:  Procedure Laterality Date   ABDOMINAL AORTOGRAM W/LOWER EXTREMITY N/A 03/30/2018   Procedure: ABDOMINAL AORTOGRAM W/LOWER EXTREMITY;  Surgeon: Serafina Mitchell, MD;  Location: [facility];  Service: Cardiovascular;  Laterality: N/A;   ABDOMINAL AORTOGRAM W/LOWER EXTREMITY Bilateral 08/27/2020   Procedure: ABDOMINAL AORTOGRAM W/LOWER EXTREMITY;  Surgeon: Lorretta Harp, MD;  Location: [facility];  Service: Cardiovascular;  Laterality: Bilateral;   APPENDECTOMY     CARDIAC CATHETERIZATION  08/29/2002  2-vessel CAD with high-grade stenoses in RCA; stenting to RCA   CARDIAC CATHETERIZATION  2014   80-90% lesion will try medical therapy   CARDIAC SURGERY     stents  2003   CARDIOVERSION N/A 11/01/2013   Procedure: [place] COMPRESSION;  Surgeon: [name], MD;  Location: [facility] CATH LAB;  Service: Cardiovascular;  Laterality: N/A;   CAROTID ANGIOGRAM N/A 04/25/2013   Procedure:  CAROTID ANGIOGRAM;  Surgeon: [name], MD;  Location: [facility] CATH LAB;  Service: Cardiovascular;  Laterality: N/A;   CAROTID ENDARTERECTOMY     COLON RESECTION  3/04, 6/04    1 bleeding diverticulitis, 2 complete colectomy   COLON SURGERY  2005   renal pouch rectal anastimosis   CORONARY ANGIOPLASTY WITH STENT PLACEMENT  09/2002   2 stents to RCA   ENDARTERECTOMY Left 06/02/2013   Procedure: ENDARTERECTOMY CAROTID-LEFT;  Surgeon: Nada Libman, MD;  Location: [facility] OR;  Service: Vascular;  Laterality: Left;   INTRAVASCULAR PRESSURE WIRE/FFR STUDY N/A 04/02/2020   Procedure: INTRAVASCULAR PRESSURE WIRE/FFR STUDY;  Surgeon: [name], MD;  Location: [facility];  Service: Cardiovascular;  Laterality: N/A;  DFR - RCA   LEFT HEART CATH AND CORONARY ANGIOGRAPHY N/A 04/02/2020   Procedure: LEFT HEART CATH AND CORONARY ANGIOGRAPHY;  Surgeon: [name], MD;  Location: [facility];  Service: Cardiovascular;  Laterality: N/A;   LEFT HEART CATHETERIZATION WITH CORONARY ANGIOGRAM N/A 02/08/2013   Procedure: LEFT HEART CATHETERIZATION WITH CORONARY ANGIOGRAM;  Surgeon: [name], MD;  Location: [facility] CATH LAB;  Service: Cardiovascular;  Laterality: N/A;   LOWER EXTREMITY ANGIOGRAM N/A 10/20/2013   Procedure: LOWER EXTREMITY ANGIOGRAM;  Surgeon: [name], MD;  Location: [facility] CATH LAB;  Service: Cardiovascular;  Laterality: N/A;   PATCH ANGIOPLASTY Left 06/02/2013   Procedure: PATCH ANGIOPLASTY;  Surgeon: Nada Libman, MD;  Location: [facility] OR;  Service: Vascular;  Laterality: Left;   PERIPHERAL VASCULAR INTERVENTION Bilateral 03/30/2018   Procedure: PERIPHERAL VASCULAR INTERVENTION;  Surgeon: Nada Libman, MD;  Location: [facility];  Service: Cardiovascular;  Laterality: Bilateral;  common iliacs   TONSILLECTOMY      Social History:  reports that he has been smoking cigarettes and e-cigarettes. He has a 85.50 pack-year smoking history. He has never used  smokeless tobacco. He reports current alcohol use of about 1.0 standard drink of alcohol per week. He reports that he does not use drugs.   Allergies  Allergen Reactions   Fentanyl Other (See Comments)    Behavioral changes   Gabapentin Other (See Comments)    ankle swells   Lisinopril Other (See Comments)    unknown   Lyrica [Pregabalin] Other (See Comments)    unknown   Metformin Diarrhea   Propofol Other (See Comments)    Heart rate dropped    Family History  Problem Relation Age of Onset   Heart disease Father        heart attack at 7   Heart attack Father    Hyperlipidemia Father    Colonic polyp Mother        that bled out   COPD Mother    Diabetes Mother    Heart disease Sister    Colonic polyp Sister    Stroke Maternal Grandfather    Heart attack Paternal Grandfather    Other Neg Hx        hypogonadism     Prior to Admission medications  Medication Sig Start Date End Date Taking? Authorizing Provider  apixaban (ELIQUIS) 5 MG TABS tablet Take 1 tablet (5 mg total) by mouth 2 (two) times daily. 03/21/21  Yes Angelita Ingles, MD  clopidogrel (PLAVIX) 75 MG tablet Take 1 tablet (75 mg total) by mouth daily. 01/04/21  Yes Azucena Fallen, MD  dapagliflozin propanediol (FARXIGA) 10 MG TABS tablet Take 1 tablet (10 mg total) by mouth daily before breakfast. 04/25/21  Yes Winfrey, Kimberlee Nearing, MD  escitalopram (LEXAPRO) 20 MG tablet Take 20 mg by mouth daily. 01/18/21  Yes [provider]  furosemide (LASIX) 40 MG tablet Take 1.5 tablets (60 mg total) by mouth daily. 12/13/21  Yes Simmons, Brittainy M, PA-C  glipiZIDE (GLUCOTROL) 5 MG tablet Take 5 mg by mouth daily before breakfast.   Yes [provider]  isosorbide mononitrate (IMDUR) 60 MG 24 hr tablet Take 2 tablets (120 mg total) by mouth daily. Patient taking differently: Take 60 mg by mouth daily. 08/09/21  Yes Pokhrel, Laxman, MD  levothyroxine (SYNTHROID) 25 MCG tablet TAKE ONE TABLET BY MOUTH  DAILY BEFORE BREAKFAST 03/13/22  Yes [name], MD  metoprolol tartrate (LOPRESSOR) 25 MG tablet Take 0.5 tablets (12.5 mg total) by mouth 2 (two) times daily. 08/29/21  Yes Sharol Harness, Brittainy M, PA-C  pantoprazole (PROTONIX) 40 MG tablet Take 40 mg by mouth every other day.   Yes [provider]  sacubitril-valsartan (ENTRESTO) 97-103 MG Take 1 tablet by mouth 2 (two) times daily. 12/13/21  Yes Robbie Lis M, PA-C  spironolactone (ALDACTONE) 25 MG tablet Take 1/2 tablet (12.5 mg total) by mouth daily. 08/09/21 05/13/22 Yes Pokhrel, Laxman, MD    Physical Exam: BP (!) 206/101   Pulse 86   Temp 99.1 F (37.3 C) (Oral)   Resp (!) 21   Ht 5\' 10"  (1.778 m)   Wt 99.8 kg   SpO2 94%   BMI 31.57 kg/m   General: 81 y.o. year-old obese male well developed well nourished in no acute distress.  Alert and oriented x3. HEENT: NCAT, EOMI Neck: Supple, trachea medial Cardiovascular: Tachycardia.  Irregular rate and rhythm with no rubs or gallops.  No thyromegaly or JVD noted.  No lower extremity edema. 2/4 pulses in all 4 extremities. Respiratory: Tachypnea.  Diffuse Rales bilaterally in lower lobes.   Abdomen: Soft, obese, nontender, with normal bowel sounds x4 quadrants. Muskuloskeletal: No cyanosis, clubbing or edema noted bilaterally Neuro: CN II-XII intact, strength 5/5 x 4, sensation, reflexes intact Skin: No ulcerative lesions noted or rashes Psychiatry: Mood is appropriate for condition and setting          Labs on Admission:  Basic Metabolic Panel: Recent Labs  Lab 05/13/22 1627 05/13/22 1639  NA 137 137  K 3.5 3.5  CL 105 104  CO2 21*  --   GLUCOSE 176* 198*  BUN 12 15  CREATININE 1.57* 1.40*  CALCIUM 9.2  --   MG 2.0  --    Liver Function Tests: Recent Labs  Lab 05/13/22 1627  AST 14*  ALT 14  ALKPHOS 57  BILITOT 1.0  PROT 6.5  ALBUMIN 3.5   Recent Labs  Lab 05/13/22 1627  LIPASE 24   No results for input(s): "AMMONIA" in the last 168  hours. CBC: Recent Labs  Lab 05/13/22 1627 05/13/22 1639  WBC 10.7*  --   NEUTROABS 9.0*  --   HGB 14.4 15.3  HCT 42.9 45.0  MCV 89.6  --   PLT  264  --    Cardiac Enzymes: No results for input(s): "CKTOTAL", "CKMB", "CKMBINDEX", "TROPONINI" in the last 168 hours.  BNP (last 3 results) Recent Labs    11/23/21 0614 12/09/21 1243 05/13/22 1627  BNP >4,500.0* 3,344.7* >4,500.0*    ProBNP (last 3 results) No results for input(s): "PROBNP" in the last 8760 hours.  CBG: Recent Labs  Lab 05/13/22 2133  GLUCAP 180*    Radiological Exams on Admission: DG Chest Port 1 View  Result Date: 05/13/2022 CLINICAL DATA:  Shortness of breath, atrial fibrillation with rapid ventricular response EXAM: PORTABLE CHEST 1 VIEW COMPARISON:  Portable exam 1404 hours compared to 11/23/2021 FINDINGS: Enlargement of cardiac silhouette. Mediastinal contours and pulmonary vascularity normal. Atherosclerotic calcification aorta. Bibasilar consolidation favor multifocal pneumonia over are locked assist or edema. Upper lungs clear. No pleural effusion or pneumothorax. Intraspinal stimulator noted. IMPRESSION: Enlargement of cardiac silhouette with bibasilar infiltrates favoring multifocal pneumonia as above. Aortic Atherosclerosis (ICD10-I70.0). Electronically Signed   By: Ulyses Southward M.D.   On: 05/13/2022 14:17    EKG: I independently viewed the EKG done and my findings are as followed: A-fib with RVR and with VPCs: QTc 505 ms  Assessment/Plan Present on Admission:  Hypertensive emergency  Atrial fibrillation with RVR ([name])  Obesity (BMI 30-39.9)- negative sleep study in the past  Principal Problem:   Acute exacerbation of CHF (congestive heart failure) ([name]) Active Problems:   Mixed hyperlipidemia   Obesity (BMI 30-39.9)- negative sleep study in the past   Atrial fibrillation with RVR ([name])   Hypertensive emergency   Acute respiratory failure with hypoxia ([name])   Chronic kidney disease, stage  3a ([name])   Elevated troponin   GERD (gastroesophageal reflux disease)   Acquired hypothyroidism  Acute respiratory failure with hypoxia in the setting of possible acute exacerbation of CHF Elevated BNP BNP > 4,500 (chronically elevated, this was 3, 344.7 on 12/09/2021) Continue total input/output, daily weights and fluid restriction Continue IV Lasix 40 twice daily Continue Cardiac diet  Echocardiogram done on 3/13 showed LVEF of 50 to 55%.  LV demonstrates global hypokinesis.  Moderate concentric LVH.  G1 DD.  Elevated LVEDP.  Echocardiogram will be done in the morning  Continue supplemental oxygen to maintain O2 sat > 94% with plan to wean patient off supplemental oxygen as tolerated (patient does not use supplemental oxygen at baseline).  Elevated troponin possible secondary to type II demand ischemia Troponin x1- 96 (chronically elevated; this was 91 on 08/05/2021) Patient denies any chest pain, continue to trend troponin  Questionable multifocal pneumonia Chest x-ray was suggestive of multifocal pneumonia Patient denies fever, chills, cough Patient was empirically treated with IV ceftriaxone and azithromycin Blood culture was obtained Procalcitonin was < 0.10, we shall obtain sputum culture, urine Legionella, strep pneumo and temporarily continue with IV antibiotics (ceftriaxone and azithromycin) with goal to de-escalate/discontinue based on above findings and repeat procalcitonin Continue Mucinex, incentive spirometry  Paroxysmal A-fib with RVR Patient was started on IV Cardizem drip with better rate control.   Consider starting metoprolol in the morning if BP permits  Continue Eliquis  Hypertensive emergency Essential hypertension Continue IV nitroglycerin  CKD 3B Creatinine 1.57 (baseline creatinine at 1.4-1.7). Renally adjust medications, avoid nephrotoxic agents/dehydration/hypotension  Prolonged QT interval QTc is 505 milliseconds Avoid QT prolonging  drugs Magnesium level will be checked Continue telemetry  Mixed hyperlipidemia No antihyperlipidemic medication noted on patient's med rec We shall await updated med rec  Type 2 diabetes mellitus with uncontrolled hyperglycemia Continue  insulin sliding scale and hypoglycemia protocol Glipizide and [place] Glade will be held at this time  GERD Continue Protonix  Acquired hypothyroidism Continue Synthroid  Obesity (BMI 31.57) Diet and lifestyle modification   DVT prophylaxis: Eliquis  Code Status: Full code  Consults: Cardiology  Family Communication: None at bedside  Severity of Illness: The appropriate patient status for this patient is INPATIENT. Inpatient status is judged to be reasonable and necessary in order to provide the required intensity of service to ensure the patient's safety. The patient's presenting symptoms, physical exam findings, and initial radiographic and laboratory data in the context of their chronic comorbidities is felt to place them at high risk for further clinical deterioration. Furthermore, it is not anticipated that the patient will be medically stable for discharge from the hospital within 2 midnights of admission.   * I certify that at the point of admission it is my clinical judgment that the patient will require inpatient hospital care spanning beyond 2 midnights from the point of admission due to high intensity of service, high risk for further deterioration and high frequency of surveillance required.*  Author: Bernadette Hoit, DO 05/13/2022 10:43 PM  For on call review [email or web address].

## 2022-05-13 NOTE — ED Notes (Signed)
Patient found sitting at the edge of the bed. Patient repositioned back into bed and educated on staying in bed and educated again on the use of the call bell.

## 2022-05-13 NOTE — ED Notes (Signed)
Daughter called and updated about patient and given admission room number. Daughter would like a call tomorrow (05/13/2022) with updates.

## 2022-05-14 ENCOUNTER — Inpatient Hospital Stay (HOSPITAL_COMMUNITY): Payer: Medicare Other

## 2022-05-14 DIAGNOSIS — I4811 Longstanding persistent atrial fibrillation: Secondary | ICD-10-CM

## 2022-05-14 DIAGNOSIS — I5033 Acute on chronic diastolic (congestive) heart failure: Secondary | ICD-10-CM | POA: Diagnosis not present

## 2022-05-14 DIAGNOSIS — I4819 Other persistent atrial fibrillation: Secondary | ICD-10-CM | POA: Diagnosis not present

## 2022-05-14 DIAGNOSIS — I4891 Unspecified atrial fibrillation: Secondary | ICD-10-CM | POA: Diagnosis not present

## 2022-05-14 DIAGNOSIS — N1831 Chronic kidney disease, stage 3a: Secondary | ICD-10-CM | POA: Diagnosis not present

## 2022-05-14 DIAGNOSIS — I5021 Acute systolic (congestive) heart failure: Secondary | ICD-10-CM | POA: Diagnosis not present

## 2022-05-14 DIAGNOSIS — I509 Heart failure, unspecified: Secondary | ICD-10-CM

## 2022-05-14 DIAGNOSIS — I10 Essential (primary) hypertension: Secondary | ICD-10-CM

## 2022-05-14 LAB — COMPREHENSIVE METABOLIC PANEL
ALT: 16 U/L (ref 0–44)
AST: 16 U/L (ref 15–41)
Albumin: 3.1 g/dL — ABNORMAL LOW (ref 3.5–5.0)
Alkaline Phosphatase: 48 U/L (ref 38–126)
Anion gap: 14 (ref 5–15)
BUN: 10 mg/dL (ref 8–23)
CO2: 24 mmol/L (ref 22–32)
Calcium: 8.4 mg/dL — ABNORMAL LOW (ref 8.9–10.3)
Chloride: 98 mmol/L (ref 98–111)
Creatinine, Ser: 1.53 mg/dL — ABNORMAL HIGH (ref 0.61–1.24)
GFR, Estimated: 46 mL/min — ABNORMAL LOW (ref >60)
Glucose, Bld: 175 mg/dL — ABNORMAL HIGH (ref 70–99)
Potassium: 2.8 mmol/L — ABNORMAL LOW (ref 3.5–5.1)
Sodium: 136 mmol/L (ref 135–145)
Total Bilirubin: 1.2 mg/dL (ref 0.3–1.2)
Total Protein: 5.7 g/dL — ABNORMAL LOW (ref 6.5–8.1)

## 2022-05-14 LAB — APTT: aPTT: 36 seconds (ref 24–36)

## 2022-05-14 LAB — HEMOGLOBIN A1C
Hgb A1c MFr Bld: 7.2 % — ABNORMAL HIGH (ref 4.8–5.6)
Mean Plasma Glucose: 159.94 mg/dL

## 2022-05-14 LAB — CBC
HCT: 38.2 % — ABNORMAL LOW (ref 39.0–52.0)
Hemoglobin: 12.7 g/dL — ABNORMAL LOW (ref 13.0–17.0)
MCH: 30 pg (ref 26.0–34.0)
MCHC: 33.2 g/dL (ref 30.0–36.0)
MCV: 90.3 fL (ref 80.0–100.0)
Platelets: 230 10*3/uL (ref 150–400)
RBC: 4.23 MIL/uL (ref 4.22–5.81)
RDW: 13.7 % (ref 11.5–15.5)
WBC: 10.2 10*3/uL (ref 4.0–10.5)
nRBC: 0 % (ref 0.0–0.2)

## 2022-05-14 LAB — PROCALCITONIN: Procalcitonin: <0.1 ng/mL

## 2022-05-14 LAB — ECHOCARDIOGRAM LIMITED
Height: 70 in
Weight: 3583.8 oz

## 2022-05-14 LAB — STREP PNEUMONIAE URINARY ANTIGEN: Strep Pneumo Urinary Antigen: NEGATIVE

## 2022-05-14 LAB — GLUCOSE, CAPILLARY
Glucose-Capillary: 166 mg/dL — ABNORMAL HIGH (ref 70–99)
Glucose-Capillary: 162 mg/dL — ABNORMAL HIGH (ref 70–99)
Glucose-Capillary: 115 mg/dL — ABNORMAL HIGH (ref 70–99)
Glucose-Capillary: 141 mg/dL — ABNORMAL HIGH (ref 70–99)

## 2022-05-14 LAB — PHOSPHORUS: Phosphorus: 2.7 mg/dL (ref 2.5–4.6)

## 2022-05-14 LAB — MAGNESIUM: Magnesium: 1.9 mg/dL (ref 1.7–2.4)

## 2022-05-14 MED ORDER — POTASSIUM CHLORIDE CRYS ER 20 MEQ PO TBCR
40.0000 meq | EXTENDED_RELEASE_TABLET | ORAL | Status: AC
  Administered 2022-05-14 (×3): 40 meq via ORAL
  Filled 2022-05-14 (×3): qty 2

## 2022-05-14 MED ORDER — SACUBITRIL-VALSARTAN 49-51 MG PO TABS
1.0000 | ORAL_TABLET | Freq: Two times a day (BID) | ORAL | Status: DC
  Administered 2022-05-14 – 2022-05-15 (×3): 1 via ORAL
  Filled 2022-05-14 (×3): qty 1

## 2022-05-14 MED ORDER — POTASSIUM CHLORIDE 10 MEQ/100ML IV SOLN
10.0000 meq | INTRAVENOUS | Status: AC
  Administered 2022-05-14 (×2): 10 meq via INTRAVENOUS
  Filled 2022-05-14: qty 100

## 2022-05-14 MED ORDER — GUAIFENESIN-DM 100-10 MG/5ML PO SYRP
5.0000 mL | ORAL_SOLUTION | ORAL | Status: DC | PRN
  Administered 2022-05-14 – 2022-05-16 (×4): 5 mL via ORAL
  Filled 2022-05-14 (×4): qty 5

## 2022-05-14 MED ORDER — DAPAGLIFLOZIN PROPANEDIOL 10 MG PO TABS
10.0000 mg | ORAL_TABLET | Freq: Every day | ORAL | Status: DC
  Administered 2022-05-14 – 2022-05-21 (×8): 10 mg via ORAL
  Filled 2022-05-14 (×8): qty 1

## 2022-05-14 MED ORDER — METOPROLOL TARTRATE 12.5 MG HALF TABLET
12.5000 mg | ORAL_TABLET | Freq: Two times a day (BID) | ORAL | Status: DC
  Administered 2022-05-14 – 2022-05-20 (×13): 12.5 mg via ORAL
  Filled 2022-05-14 (×13): qty 1

## 2022-05-14 NOTE — TOC Progression Note (Signed)
Transition of Care [facility]) - Progression Note    Patient Details  Name: Albert Powell MRN: [id number] Date of Birth: Jan 27, 1941  [facility] of [facility]) CM/SW Contact  Leone Haven, RN Phone Number: 05/14/2022, 9:43 PM  Clinical Narrative:    from home with wife, HF, afb RVR, bp in 240's, conts on nitro drip, cxr shows PNA, conts on iv abx, k is 2.8 , received runs, conts on iv lasix, and 2 liters oxygen.  TOC following.        Expected Discharge Plan and Services                                                 Social Determinants of Health (SDOH) Interventions    Readmission Risk Interventions     No data to display

## 2022-05-14 NOTE — Evaluation (Signed)
Occupational Therapy Evaluation Patient Details Name: Albert Powell MRN: [id number] DOB: 25-Oct-1940 Today's Date: 05/14/2022   History of Present Illness Patient is a 81 yo male presenting to the ED with increased SOB and HR in 130s on 05/13/22. Admitted with CHF exacerbation same day. PMH includes: CAD, chronic diastolic CHF, essential hypertension, hyperlipidemia, A-fib, COPD and T2DM   Clinical Impression   Prior to this admission, patient living independently with his wife (wife is currently going through radiation for lung cancer) and still drives. Currently, patient requiring 2L of oxygen (does not wear oxygen at home) and HR escalating but not sustaining to 133 with basic movements. Patient min guard for ADLs and transfers, however did not progress mobility due to increased WOB and HR. OT recommending HHOT at discharge; OT will continue to follow acutely.      Recommendations for follow up therapy are one component of a multi-disciplinary discharge planning process, led by the attending physician.  Recommendations may be updated based on patient status, additional functional criteria and insurance authorization.   Follow Up Recommendations  Home health OT    Assistance Recommended at Discharge Intermittent Supervision/Assistance  Patient can return home with the following A little help with walking and/or transfers;A little help with bathing/dressing/bathroom;Assist for transportation;Help with stairs or ramp for entrance    Functional Status Assessment  Patient has had a recent decline in their functional status and demonstrates the ability to make significant improvements in function in a reasonable and predictable amount of time.  Equipment Recommendations  None recommended by OT (Patient has DME needed)    Recommendations for Other Services       Precautions / Restrictions Precautions Precautions: Fall Precaution Comments: watch HR and O2 Restrictions Weight Bearing  Restrictions: No      Mobility Bed Mobility Overal bed mobility: Needs Assistance Bed Mobility: Supine to Sit, Sit to Supine     Supine to sit: Supervision Sit to supine: Supervision   General bed mobility comments: increased time needed, HOB elevated, increased WOB noted    Transfers Overall transfer level: Needs assistance Equipment used: 1 person hand held assist Transfers: Sit to/from Stand Sit to Stand: Min assist           General transfer comment: min A to come into standing with one person HHA, able to take a few steps toward HOB, increased WOB and HR with minimal ambulation      Balance Overall balance assessment: Needs assistance Sitting-balance support: Single extremity supported, Feet supported Sitting balance-Leahy Scale: Fair     Standing balance support: Single extremity supported, During functional activity Standing balance-Leahy Scale: Fair Standing balance comment: reliant on AD for longer distances                           ADL either performed or assessed with clinical judgement   ADL Overall ADL's : Needs assistance/impaired Eating/Feeding: Set up;Sitting   Grooming: Wash/dry hands;Wash/dry face;Oral care;Sitting;Set up   Upper Body Bathing: Set up;Sitting   Lower Body Bathing: Minimal assistance;Sitting/lateral leans;Sit to/from stand   Upper Body Dressing : Set up;Sitting   Lower Body Dressing: Minimal assistance;Sitting/lateral leans;Sit to/from stand Lower Body Dressing Details (indicate cue type and reason): able to don socks with increased effort, min A for safety Toilet Transfer: Minimal assistance;Rolling walker (2 wheels)   Toileting- Clothing Manipulation and Hygiene: Min guard;Sitting/lateral lean;Sit to/from stand       Functional mobility during ADLs: Min guard;Rolling walker (  2 wheels) General ADL Comments: Patient presenting with decreased activity tolerance, need for supplemental O2 and increased HR with  activity     Vision Baseline Vision/History: 0 No visual deficits Ability to See in Adequate Light: 0 Adequate Patient Visual Report: No change from baseline       Perception     Praxis      Pertinent Vitals/Pain Pain Assessment Pain Assessment: No/denies pain     Hand Dominance Right   Extremity/Trunk Assessment Upper Extremity Assessment Upper Extremity Assessment: Generalized weakness   Lower Extremity Assessment Lower Extremity Assessment: Defer to PT evaluation   Cervical / Trunk Assessment Cervical / Trunk Assessment: Other exceptions Cervical / Trunk Exceptions: increased body habitus   Communication Communication Communication: No difficulties   Cognition Arousal/Alertness: Awake/alert Behavior During Therapy: WFL for tasks assessed/performed Overall Cognitive Status: Within Functional Limits for tasks assessed                                       General Comments  HR to 133 with basic movement, increased WOB with all activity, remained on 2L O2    Exercises     Shoulder Instructions      Home Living Family/patient expects to be discharged to:: Private residence Living Arrangements: Spouse/significant other Available Help at Discharge: Family;Available 24 hours/day Type of Home: Apartment Home Access: Level entry     Home Layout: One level     Bathroom Shower/Tub: Tub/shower unit;Curtain   Firefighter: Standard     Home Equipment: Rollator (4 wheels);Shower seat;Cane - single point          Prior Functioning/Environment Prior Level of Function : Driving;Independent/Modified Independent             Mobility Comments: Uses rollator for mobility vs no AD ADLs Comments: reports independence        OT Problem List: Decreased strength;Decreased activity tolerance;Impaired balance (sitting and/or standing);Decreased coordination;Decreased knowledge of precautions;Cardiopulmonary status limiting activity      OT  Treatment/Interventions: Self-care/ADL training;Therapeutic exercise;Energy conservation;DME and/or AE instruction;Manual therapy;Patient/family education;Balance training;Therapeutic activities    OT Goals(Current goals can be found in the care plan section) Acute Rehab OT Goals Patient Stated Goal: to get better and back to independence OT Goal Formulation: With patient Time For Goal Achievement: 05/28/22 Potential to Achieve Goals: Good ADL Goals Pt Will Perform Lower Body Bathing: Independently;sitting/lateral leans;sit to/from stand Pt Will Perform Lower Body Dressing: Independently;sitting/lateral leans;sit to/from stand Pt Will Transfer to Toilet: Independently;ambulating;regular height toilet Pt Will Perform Toileting - Clothing Manipulation and hygiene: Independently Additional ADL Goal #1: Patient will demonstrate increased activity tolerance to complete functional task in standing for 3-5 minutes without needing seated rest break. Additional ADL Goal #2: Patient will verbalize 3 strategies for managing CHF exacerbation to prevent future admissions.  OT Frequency: Min 2X/week    Co-evaluation              AM-PAC OT "6 Clicks" Daily Activity     Outcome Measure Help from another person eating meals?: A Little Help from another person taking care of personal grooming?: A Little Help from another person toileting, which includes using toliet, bedpan, or urinal?: A Little Help from another person bathing (including washing, rinsing, drying)?: A Little Help from another person to put on and taking off regular upper body clothing?: A Little Help from another person to put on and taking off regular  lower body clothing?: A Little 6 Click Score: 18   End of Session Nurse Communication: Mobility status  Activity Tolerance: Patient tolerated treatment well Patient left: in bed;with call bell/phone within reach;with bed alarm set  OT Visit Diagnosis: Unsteadiness on feet  (R26.81);Other abnormalities of gait and mobility (R26.89);Muscle weakness (generalized) (M62.81)                Time: 2035-5974 OT Time Calculation (min): 22 min Charges:  OT General Charges $OT Visit: 1 Visit OT Evaluation $OT Eval Moderate Complexity: 1 Mod  Pollyann Glen E. Jurnei Latini, OTR/L Acute Rehabilitation Services [phone]   Cherlyn Cushing 05/14/2022, 1:49 PM

## 2022-05-14 NOTE — Consult Note (Signed)
Cardiology Consultation:    Patient ID: Albert Powell MRN: [id number]; DOB: [birth year]  Admit date: 05/13/2022 Date of Consult: 05/14/2022  Primary Care Provider: Leeroy Cha, MD Primary Cardiologist: Albert Burow, MD  Primary Electrophysiologist:  None    Patient Profile:   Albert Powell is a 81 y.o. male with a hx of CAD s/p RCA stent in 2003  who is being seen today for the evaluation of acute on chronic diastolic heart failure and atrial fibrillation w/ RVR at the request of hospitalist.  History of Present Illness:   Albert Powell is a pleasant 81 year old male with a hx of CAD s/p RCA stent in 2003, B/L Iliac stenting in 123456, and LICA endardectomy in 2014. He presented to the emergency department via EMS after feeling short of breath for almost the entire day yesterday. Upon admission, EMS noticed he was in Afib with RVR (pulse was 128) and Cardizem drip was started in the ED.  He states he was "gasping for air" at rest and exertion. He states he had an episode like this a couple years ago but was not as severe. He says symptom onset has been gradual for the last few days. He has not noticed any new weight changes. He has a 50 pack year smoking history, and previously would drink 1 bottle of wine a week before stopping 2 years ago. He states his father suffered from an MI at the age of 62 and passed away. He does not have any oxygen requirements at home, and is currently on 2L of O2. He states he does not need extra pillow to sleep. He has not had any recent changes to his diet. He denies any fever, chills, nausea, vomiting.   Past Medical History:  Diagnosis Date   Atrial fibrillation ([place]) 03/30/2020   Bradycardia, sinus 02/18/2013   CAD (coronary artery disease)    stent to RCA 2003 also had 40% lesion at that time; NUCLEAR STRESS TEST, 01/16/2010 - normal  now with cath 80-90% stenosis in LAD, will try medical therapy if no improvement PCI   Carotid artery  disease ([name])    Cataract    CHF (congestive heart failure) ([place])    Claudication ([place])    LEA DUPLEX, 07/07/2008 - Normal   COPD (chronic obstructive pulmonary disease) ([place])    DDD (degenerative disc disease) 2008   Diabetes mellitus    GERD (gastroesophageal reflux disease)    H/O hiatal hernia    History of colonoscopy 2004   finding of tics and AMV only no polpys   Hyperlipidemia 02/05/2013   Hypertension    Onychomycosis    Rosacea    Tobacco abuse 02/05/2013    Past Surgical History:  Procedure Laterality Date   ABDOMINAL AORTOGRAM W/LOWER EXTREMITY N/A 03/30/2018   Procedure: ABDOMINAL AORTOGRAM W/LOWER EXTREMITY;  Surgeon: Serafina Mitchell, MD;  Location: [facility];  Service: Cardiovascular;  Laterality: N/A;   ABDOMINAL AORTOGRAM W/LOWER EXTREMITY Bilateral 08/27/2020   Procedure: ABDOMINAL AORTOGRAM W/LOWER EXTREMITY;  Surgeon: Lorretta Harp, MD;  Location: [facility];  Service: Cardiovascular;  Laterality: Bilateral;   APPENDECTOMY     CARDIAC CATHETERIZATION  08/29/2002   2-vessel CAD with high-grade stenoses in RCA; stenting to RCA   CARDIAC CATHETERIZATION  2014   80-90% lesion will try medical therapy   CARDIAC SURGERY     stents  2003   CARDIOVERSION N/A 11/01/2013   Procedure: PSUEDO ANUERYSM COMPRESSION;  Surgeon: Roderic Palau  Adora Fridge, MD;  Location: [facility];  Service: Cardiovascular;  Laterality: N/A;   CAROTID ANGIOGRAM N/A 04/25/2013   Procedure: CAROTID ANGIOGRAM;  Surgeon: Lorretta Harp, MD;  Location: [facility];  Service: Cardiovascular;  Laterality: N/A;   CAROTID ENDARTERECTOMY     COLON RESECTION  3/04, 6/04    1 bleeding diverticulitis, 2 complete colectomy   COLON SURGERY  2005   renal pouch rectal anastimosis   CORONARY ANGIOPLASTY WITH STENT PLACEMENT  09/2002   2 stents to RCA   ENDARTERECTOMY Left 06/02/2013   Procedure: ENDARTERECTOMY CAROTID-LEFT;  Surgeon: Serafina Mitchell, MD;  Location: [facility];  Service: Vascular;   Laterality: Left;   INTRAVASCULAR PRESSURE WIRE/FFR STUDY N/A 04/02/2020   Procedure: INTRAVASCULAR PRESSURE WIRE/FFR STUDY;  Surgeon: Lorretta Harp, MD;  Location: [facility];  Service: Cardiovascular;  Laterality: N/A;  DFR - RCA   LEFT HEART CATH AND CORONARY ANGIOGRAPHY N/A 04/02/2020   Procedure: LEFT HEART CATH AND CORONARY ANGIOGRAPHY;  Surgeon: Lorretta Harp, MD;  Location: [facility];  Service: Cardiovascular;  Laterality: N/A;   LEFT HEART CATHETERIZATION WITH CORONARY ANGIOGRAM N/A 02/08/2013   Procedure: LEFT HEART CATHETERIZATION WITH CORONARY ANGIOGRAM;  Surgeon: Lorretta Harp, MD;  Location: [facility] CATH LAB;  Service: Cardiovascular;  Laterality: N/A;   LOWER EXTREMITY ANGIOGRAM N/A 10/20/2013   Procedure: LOWER EXTREMITY ANGIOGRAM;  Surgeon: Lorretta Harp, MD;  Location: [facility] CATH LAB;  Service: Cardiovascular;  Laterality: N/A;   PATCH ANGIOPLASTY Left 06/02/2013   Procedure: PATCH ANGIOPLASTY;  Surgeon: Serafina Mitchell, MD;  Location: [facility] OR;  Service: Vascular;  Laterality: Left;   PERIPHERAL VASCULAR INTERVENTION Bilateral 03/30/2018   Procedure: PERIPHERAL VASCULAR INTERVENTION;  Surgeon: Serafina Mitchell, MD;  Location: [facility];  Service: Cardiovascular;  Laterality: Bilateral;  common iliacs   TONSILLECTOMY         Inpatient Medications: Scheduled Meds:  apixaban  5 mg Oral BID   dextromethorphan-guaiFENesin  1 tablet Oral BID   furosemide  40 mg Intravenous Q12H   insulin aspart  0-5 Units Subcutaneous QHS   insulin aspart  0-9 Units Subcutaneous TID WC   levothyroxine  25 mcg Oral Q0600   pantoprazole  40 mg Oral QODAY   Continuous Infusions:  azithromycin     cefTRIAXone (ROCEPHIN)  IV     nitroGLYCERIN 115 mcg/min (05/14/22 0649)   PRN Meds: acetaminophen **OR** acetaminophen, guaiFENesin-dextromethorphan  Allergies:    Allergies  Allergen Reactions   Fentanyl Other (See Comments)    Behavioral changes   Gabapentin Other  (See Comments)    ankle swells   Lisinopril Other (See Comments)    unknown   Lyrica [Pregabalin] Other (See Comments)    unknown   Metformin Diarrhea   Propofol Other (See Comments)    Heart rate dropped    Social History:   Social History   Socioeconomic History   Marital status: Married    Spouse name: Albert Powell   Number of children: 1   Years of education: Not on file   Highest education level: High school graduate  Occupational History   Occupation: retired  Tobacco Use   Smoking status: Every Day    Packs/day: 1.50    Years: 57.00    Total pack years: 85.50    Types: Cigarettes, E-cigarettes   Smokeless tobacco: Never   Tobacco comments:    pt states that he is using the vapor cigs  Vaping Use   Vaping Use: Every day   Start date: 09/15/2016   Substances: Nicotine  Substance and Sexual Activity   Alcohol use: Yes    Alcohol/week: 1.0 standard drink of alcohol    Types: 1 Glasses of wine per week    Comment: "very little"   Drug use: No   Sexual activity: Not on file  Other Topics Concern   Not on file  Social History Narrative   Not on file   Social Determinants of Health   Financial Resource Strain: Low Risk  (08/06/2021)   Overall Financial Resource Strain (CARDIA)    Difficulty of Paying Living Expenses: Not very hard  Recent Concern: Financial Resource Strain - Medium Risk (06/07/2021)   Overall Financial Resource Strain (CARDIA)    Difficulty of Paying Living Expenses: Somewhat hard  Food Insecurity: No Food Insecurity (08/06/2021)   Hunger Vital Sign    Worried About Running Out of Food in the Last Year: Never true    Ran Out of Food in the Last Year: Never true  Transportation Needs: No Transportation Needs (08/06/2021)   PRAPARE - Administrator, Civil Service (Medical): No    Lack of Transportation (Non-Medical): No  Physical Activity: Not on file  Stress: Not on file  Social Connections: Not on file  Intimate Partner  Violence: Not on file    Family History:   Family History  Problem Relation Age of Onset   Heart disease Father        heart attack at 57   Heart attack Father    Hyperlipidemia Father    Colonic polyp Mother        that bled out   COPD Mother    Diabetes Mother    Heart disease Sister    Colonic polyp Sister    Stroke Maternal Grandfather    Heart attack Paternal Grandfather    Other Neg Hx        hypogonadism     ROS:  Please see the history of present illness.  All other ROS reviewed and negative.     Physical Exam/Data:   Vitals:   05/14/22 0600 05/14/22 0620 05/14/22 0648 05/14/22 0810  BP: (!) 177/86 (!) 158/87 (!) 161/99 (!) 156/63  Pulse:  80    Resp:  18    Temp:      TempSrc:      SpO2:  96%  91%  Weight:      Height:        Intake/Output Summary (Last 24 hours) at 05/14/2022 0830 Last data filed at 05/14/2022 0810 Gross per 24 hour  Intake 1873.89 ml  Output 1350 ml  Net 523.89 ml      05/14/2022    4:00 AM 05/13/2022   11:00 PM 05/13/2022    6:52 PM  Last 3 Weights  Weight (lbs) 223 lb 15.8 oz 223 lb 15.8 oz 220 lb  Weight (kg) 101.6 kg 101.6 kg 99.791 kg     Body mass index is 32.14 kg/m.    Physical Exam:  General: 81y/o obese male in no acute distress  Cardiovascular: Irregular rhythm, normal rate with no rubs or gallops. Lower extremity edema not present.  Pulmonary: Wheezes heard in upper lobes Extremities: Normal temperature, peripheral pulses in both lower extremities palpated +2  EKG:  The EKG was personally reviewed and demonstrates:  Atrial Fibrillation with PVC Telemetry:  Telemetry was personally reviewed and demonstrates:  Atrial Fibrillation    Laboratory  Data:  Chemistry Recent Labs  Lab 05/13/22 1627 05/13/22 1639 05/14/22 0647  NA 137 137 136  K 3.5 3.5 2.8*  CL 105 104 98  CO2 21*  --  24  GLUCOSE 176* 198* 175*  BUN 12 15 10   CREATININE 1.57* 1.40* 1.53*  CALCIUM 9.2  --  8.4*  GFRNONAA 44*  --  46*   ANIONGAP 11  --  14    Recent Labs  Lab 05/13/22 1627 05/14/22 0647  PROT 6.5 5.7*  ALBUMIN 3.5 3.1*  AST 14* 16  ALT 14 16  ALKPHOS 57 48  BILITOT 1.0 1.2   Hematology Recent Labs  Lab 05/13/22 1627 05/13/22 1639 05/14/22 0647  WBC 10.7*  --  10.2  RBC 4.79  --  4.23  HGB 14.4 15.3 12.7*  HCT 42.9 45.0 38.2*  MCV 89.6  --  90.3  MCH 30.1  --  30.0  MCHC 33.6  --  33.2  RDW 13.8  --  13.7  PLT 264  --  230   BNP Recent Labs  Lab 05/13/22 1627  BNP >4,500.0*     Radiology/Studies:  DG Chest Port 1 View  Result Date: 05/13/2022 CLINICAL DATA:  Shortness of breath, atrial fibrillation with rapid ventricular response EXAM: PORTABLE CHEST 1 VIEW COMPARISON:  Portable exam 1404 hours compared to 11/23/2021 FINDINGS: Enlargement of cardiac silhouette. Mediastinal contours and pulmonary vascularity normal. Atherosclerotic calcification aorta. Bibasilar consolidation favor multifocal pneumonia over are locked assist or edema. Upper lungs clear. No pleural effusion or pneumothorax. Intraspinal stimulator noted. IMPRESSION: Enlargement of cardiac silhouette with bibasilar infiltrates favoring multifocal pneumonia as above. Aortic Atherosclerosis (ICD10-I70.0). Electronically Signed   By: 01/23/2022 M.D.   On: 05/13/2022 14:17     Assessment and Plan:   Acute on Chronic Diastolic Heart Failure Exacerbation Secondary to Unknown Causes  Pt has a history of diastolic heart failure, and his current BNP is elevated over 4500.Chest X-Ray was done in the emergency department which revealed bibasilar fluid collection or infiltrates. He has responded well to lasix given in the emergency department. Unsure about etiology of exacerbation. Pt states he is medication complaint, and denies any systemic symptoms such as fever, and chills that may indicate an infection. On physical exam, minimal crackles were heard on physical exam, however chest x-ray is indicative of fluid build up. No edema  in bilateral lower extremities appreciated. His last ejection fraction was 50-55% with global hypokinesis of LV, and was done in March of this year.   Plan:  - Continue diuresis with Lasix 40mg  - Echo pending - Wean nitroglycerin  - Daily weights - Strict I/O - Daily BMP to monitor kidney function - Fluid restricted diet  - Started entresto 49-51 mg - Potassium level was 2.7, likely due to diuresis, will replete with IV potassium (one dose) and KCL tablets April Q4H for 3 doses   Atrial Fibrillation Pt has not converted spontaneously to normal rhythm since admission. He was giving a diltiazem drip in the emergency department. He denies any shortness of breath, palpitations,  or dizziness, but states he feels he is fatigued. Current pulse is 80, however upon admission pulse was 128. Telemetry readings show afib this AM.   Plan:  - Continue eliquis 5 - Monitor telemetry  - Consider the addition of beta-blocker if pt becomes symptomatic and BP is in control post-diuresis.  Hypertension Pt's blood pressure on admission was 219/92, and this AM was 156/63. He was started on a  nitro drip in the emergency department. Planning on weaning nitro drip today, and added entresto to his regimen to help with BP control, as well as diuresing with Lasix.        For questions or updates, please contact [place] Please consult [email or web address] for contact info under     Signed, Drucie Opitz, MD  05/14/2022 8:30 AM

## 2022-05-14 NOTE — Progress Notes (Addendum)
PROGRESS NOTE    Albert Powell  [id number] DOB: Jul 06, 1941 DOA: 05/13/2022 PCP: Leeroy Cha, MD  81/M with history of CAD, chronic diastolic CHF, COPD, type 2 diabetes mellitus, atrial fibrillation, hypertension presented to the ED with shortness of breath for 2 to 3 days, hypoxic in the ER, noted to be in A-fib RVR, systolic blood pressure in the 240 range.  Work-up noted creatinine of 1.57, troponin of 96, BNP 4500, chest x-ray noted bibasilar infiltrates, possibly multifocal pneumonia  Subjective: -Feels better, breathing is improving  Assessment and Plan:  Acute on chronic diastolic CHF Hypertensive urgency -Echo 3/23 noted EF 50-55%, grade 1 DD -Clinically suspect symptoms primarily related to CHF, less likely pneumonia -Continue IV Lasix today, cardiology following -Wean off nitro gtt. -Resume home regimen of Farxiga and Aldactone  Hypokalemia -Replete   ?  Multifocal pneumonia -Clinically low suspicion for infectious process, no fever or leukocytosis, procalcitonin is low as well -Started on empiric ABX on admission, will repeat chest x-ray -Low threshold to discontinue antibiotics  CKD 3b -Stable, monitor with diuresis, baseline creatinine around 1.4-1.7  Paroxysmal A-fib with RVR -Heart rate suboptimal, will add beta-blocker, continue Eliquis  Hypothyroidism -Continue Synthroid  Type 2 diabetes mellitus -Resume Farxiga, glipizide on hold  Obesity   DVT prophylaxis: Eliquis Code Status: Full code Family Communication: Discussed with patient detail, no family at bedside Disposition Plan: Home likely 48 hours  Consultants:    Procedures:   Antimicrobials:    Objective: Vitals:   05/14/22 1050 05/14/22 1055 05/14/22 1100 05/14/22 1105  BP:    (!) 163/89  Pulse: 97 98  88  Resp: 20 19 (!) 22 18  Temp:      TempSrc:      SpO2: 93% 97%  95%  Weight:      Height:        Intake/Output Summary (Last 24 hours) at 05/14/2022  1247 Last data filed at 05/14/2022 1203 Gross per 24 hour  Intake 1873.89 ml  Output 2350 ml  Net -476.11 ml   Filed Weights   05/13/22 1852 05/13/22 2300 05/14/22 0400  Weight: 99.8 kg 101.6 kg 101.6 kg    Examination:  General exam: Obese male sitting up in bed, AAOx3 Respiratory system: Few basilar Rales Cardiovascular system: S1-S2, irregularly irregular rhythm Abd: nondistended, soft and nontender.Normal bowel sounds heard. Central nervous system: Alert and oriented. No focal neurological deficits. Extremities: 1 is edema Skin: No rashes Psychiatry:  Mood & affect appropriate.     Data Reviewed:   CBC: Recent Labs  Lab 05/13/22 1627 05/13/22 1639 05/14/22 0647  WBC 10.7*  --  10.2  NEUTROABS 9.0*  --   --   HGB 14.4 15.3 12.7*  HCT 42.9 45.0 38.2*  MCV 89.6  --  90.3  PLT 264  --  123456   Basic Metabolic Panel: Recent Labs  Lab 05/13/22 1627 05/13/22 1639 05/14/22 0647  NA 137 137 136  K 3.5 3.5 2.8*  CL 105 104 98  CO2 21*  --  24  GLUCOSE 176* 198* 175*  BUN 12 15 10   CREATININE 1.57* 1.40* 1.53*  CALCIUM 9.2  --  8.4*  MG 2.0  --  1.9  PHOS  --   --  2.7   GFR: Estimated Creatinine Clearance: 46 mL/min (A) (by C-G formula based on SCr of 1.53 mg/dL (H)). Liver Function Tests: Recent Labs  Lab 05/13/22 1627 05/14/22 0647  AST 14* 16  ALT 14 16  ALKPHOS  57 48  BILITOT 1.0 1.2  PROT 6.5 5.7*  ALBUMIN 3.5 3.1*   Recent Labs  Lab 05/13/22 1627  LIPASE 24   No results for input(s): "AMMONIA" in the last 168 hours. Coagulation Profile: No results for input(s): "INR", "PROTIME" in the last 168 hours. Cardiac Enzymes: No results for input(s): "CKTOTAL", "CKMB", "CKMBINDEX", "TROPONINI" in the last 168 hours. BNP (last 3 results) No results for input(s): "PROBNP" in the last 8760 hours. HbA1C: Recent Labs    05/14/22 0647  HGBA1C 7.2*   CBG: Recent Labs  Lab 05/13/22 2133 05/14/22 0600 05/14/22 1146  GLUCAP 180* 166* 162*    Lipid Profile: No results for input(s): "CHOL", "HDL", "LDLCALC", "TRIG", "CHOLHDL", "LDLDIRECT" in the last 72 hours. Thyroid Function Tests: No results for input(s): "TSH", "T4TOTAL", "FREET4", "T3FREE", "THYROIDAB" in the last 72 hours. Anemia Panel: No results for input(s): "VITAMINB12", "FOLATE", "FERRITIN", "TIBC", "IRON", "RETICCTPCT" in the last 72 hours. Urine analysis:    Component Value Date/Time   COLORURINE YELLOW 01/01/2021 1351   APPEARANCEUR CLEAR 01/01/2021 1351   LABSPEC 1.009 01/01/2021 1351   PHURINE 6.0 01/01/2021 1351   GLUCOSEU >=500 (A) 01/01/2021 1351   HGBUR SMALL (A) 01/01/2021 1351   BILIRUBINUR NEGATIVE 01/01/2021 1351   KETONESUR NEGATIVE 01/01/2021 1351   PROTEINUR >=300 (A) 01/01/2021 1351   UROBILINOGEN 1.0 05/31/2013 1432   NITRITE NEGATIVE 01/01/2021 1351   LEUKOCYTESUR NEGATIVE 01/01/2021 1351   Sepsis Labs: @LABRCNTIP (procalcitonin:4,lacticidven:4)  ) Recent Results (from the past 240 hour(s))  Resp Panel by RT-PCR (Flu A&B, Covid) Anterior Nasal Swab     Status: None   Collection Time: 05/13/22  2:26 PM   Specimen: Anterior Nasal Swab  Result Value Ref Range Status   SARS Coronavirus 2 by RT PCR NEGATIVE NEGATIVE Final    Comment: (NOTE) SARS-CoV-2 target nucleic acids are NOT DETECTED.  The SARS-CoV-2 RNA is generally detectable in upper respiratory specimens during the acute phase of infection. The lowest concentration of SARS-CoV-2 viral copies this assay can detect is 138 copies/mL. A negative result does not preclude SARS-Cov-2 infection and should not be used as the sole basis for treatment or other patient management decisions. A negative result may occur with  improper specimen collection/handling, submission of specimen other than nasopharyngeal swab, presence of viral mutation(s) within the areas targeted by this assay, and inadequate number of viral copies(<138 copies/mL). A negative result must be combined  with clinical observations, patient history, and epidemiological information. The expected result is Negative.  Fact Sheet for Patients:  05/15/22  Fact Sheet for Healthcare Providers:  BloggerCourse.com  This test is no t yet approved or cleared by the [employer] FDA and  has been authorized for detection and/or diagnosis of SARS-CoV-2 by FDA under an Emergency Use Authorization (EUA). This EUA will remain  in effect (meaning this test can be used) for the duration of the COVID-19 declaration under Section 564(b)(1) of the Act, 21 U.S.C.section 360bbb-3(b)(1), unless the authorization is terminated  or revoked sooner.       Influenza A by PCR NEGATIVE NEGATIVE Final   Influenza B by PCR NEGATIVE NEGATIVE Final    Comment: (NOTE) The Xpert Xpress SARS-CoV-2/FLU/RSV plus assay is intended as an aid in the diagnosis of influenza from Nasopharyngeal swab specimens and should not be used as a sole basis for treatment. Nasal washings and aspirates are unacceptable for Xpert Xpress SARS-CoV-2/FLU/RSV testing.  Fact Sheet for Patients: [place]  Fact Sheet for Healthcare Providers: BloggerCourse.com  This  test is not yet approved or cleared by the [place] and has been authorized for detection and/or diagnosis of SARS-CoV-2 by FDA under an Emergency Use Authorization (EUA). This EUA will remain in effect (meaning this test can be used) for the duration of the COVID-19 declaration under Section 564(b)(1) of the Act, 21 U.S.C. section 360bbb-3(b)(1), unless the authorization is terminated or revoked.  Performed at [facility], [address] [place], [place] [address]   Blood culture (routine x 2)     Status: None (Preliminary result)   Collection Time: 05/13/22  4:27 PM   Specimen: BLOOD  Result Value Ref Range Status   Specimen Description  BLOOD RIGHT ANTECUBITAL  Final   Special Requests   Final    BOTTLES DRAWN AEROBIC AND ANAEROBIC Blood Culture results may not be optimal due to an excessive volume of blood received in culture bottles   Culture   Final    NO GROWTH < 12 HOURS Performed at [facility], [address], [place], [place] [address]    Report Status PENDING  Incomplete  Blood culture (routine x 2)     Status: None (Preliminary result)   Collection Time: 05/13/22  4:27 PM   Specimen: BLOOD LEFT HAND  Result Value Ref Range Status   Specimen Description BLOOD LEFT HAND  Final   Special Requests   Final    BOTTLES DRAWN AEROBIC AND ANAEROBIC Blood Culture adequate volume   Culture   Final    NO GROWTH < 12 HOURS Performed at [facility], [address] [place], [place] [address]    Report Status PENDING  Incomplete     Radiology Studies: DG Chest Port 1 View  Result Date: 05/13/2022 CLINICAL DATA:  Shortness of breath, atrial fibrillation with rapid ventricular response EXAM: PORTABLE CHEST 1 VIEW COMPARISON:  Portable exam 1404 hours compared to 11/23/2021 FINDINGS: Enlargement of cardiac silhouette. Mediastinal contours and pulmonary vascularity normal. Atherosclerotic calcification aorta. Bibasilar consolidation favor multifocal pneumonia over are locked assist or edema. Upper lungs clear. No pleural effusion or pneumothorax. Intraspinal stimulator noted. IMPRESSION: Enlargement of cardiac silhouette with bibasilar infiltrates favoring multifocal pneumonia as above. Aortic Atherosclerosis (ICD10-I70.0). Electronically Signed   By: Ulyses Southward M.D.   On: 05/13/2022 14:17     Scheduled Meds:  apixaban  5 mg Oral BID   dextromethorphan-guaiFENesin  1 tablet Oral BID   furosemide  40 mg Intravenous Q12H   insulin aspart  0-5 Units Subcutaneous QHS   insulin aspart  0-9 Units Subcutaneous TID WC   levothyroxine  25 mcg Oral Q0600   pantoprazole  40 mg Oral QODAY   potassium chloride  40 mEq  Oral Q4H   sacubitril-valsartan  1 tablet Oral BID   Continuous Infusions:  azithromycin 500 mg (05/14/22 0949)   cefTRIAXone (ROCEPHIN)  IV 1 g (05/14/22 0836)   nitroGLYCERIN 100 mcg/min (05/14/22 0948)   potassium chloride 10 mEq (05/14/22 1234)     LOS: 1 day    Time spent:  Zannie Cove, MD [facility]   05/14/2022, 12:47 PM

## 2022-05-14 NOTE — Evaluation (Signed)
Physical Therapy Evaluation Patient Details Name: Albert Powell MRN: [id number] DOB: 1941-03-12 Today's Date: 05/14/2022  History of Present Illness  Patient is a 81 yo male presenting to the ED with increased SOB and HR in 130s on 05/13/22. Admitted with CHF exacerbation same day. PMH includes: CAD, chronic diastolic CHF, essential hypertension, hyperlipidemia, A-fib, COPD and T2DM  Clinical Impression  Pt admitted with/for SOB due to CHF exacerbation.  Pt is deconditioned, generally weak with some chronic LE strength issues, needing minimal assist presently.  Pt currently limited functionally due to the problems listed below.  (see problems list.)  Pt will benefit from PT to maximize function and safety to be able to get home safely with available assist.        Recommendations for follow up therapy are one component of a multi-disciplinary discharge planning process, led by the attending physician.  Recommendations may be updated based on patient status, additional functional criteria and insurance authorization.  Follow Up Recommendations Home health PT      Assistance Recommended at Discharge Intermittent Supervision/Assistance  Patient can return home with the following  A little help with walking and/or transfers;A little help with bathing/dressing/bathroom;Assistance with feeding;Assist for transportation;Help with stairs or ramp for entrance    Equipment Recommendations None recommended by PT  Recommendations for Other Services       Functional Status Assessment Patient has had a recent decline in their functional status and demonstrates the ability to make significant improvements in function in a reasonable and predictable amount of time.     Precautions / Restrictions Precautions Precautions: Fall Precaution Comments: watch HR and O2 Restrictions Weight Bearing Restrictions: No      Mobility  Bed Mobility Overal bed mobility: Needs Assistance Bed Mobility: Supine  to Sit, Sit to Supine     Supine to sit: Supervision Sit to supine: Supervision   General bed mobility comments: increased time needed, HOB elevated, increased WOB noted    Transfers Overall transfer level: Needs assistance Equipment used: 1 person hand held assist Transfers: Sit to/from Stand Sit to Stand: Min assist           General transfer comment: cues for hand placement, assist forward and for stability    Ambulation/Gait Ambulation/Gait assistance: Min assist, Min guard Gait Distance (Feet): 120 Feet Assistive device: Rollator (4 wheels) Gait Pattern/deviations: Step-through pattern   Gait velocity interpretation: <1.8 ft/sec, indicate of risk for recurrent falls   General Gait Details: mildly unsteady overall with rollator, cues for better proximity to AD and posture.  Due to R LE weakness, pt showed mild L hip drop and short swing time with shorter step length.  Stairs            Wheelchair Mobility    Modified Rankin (Stroke Patients Only)       Balance Overall balance assessment: Needs assistance Sitting-balance support: Single extremity supported, Feet supported Sitting balance-Leahy Scale: Fair     Standing balance support: Single extremity supported, During functional activity Standing balance-Leahy Scale: Fair Standing balance comment: reliant on AD for longer distances                             Pertinent Vitals/Pain Pain Assessment Pain Assessment: No/denies pain    Home Living Family/patient expects to be discharged to:: Private residence Living Arrangements: Spouse/significant other Available Help at Discharge: Family;Available 24 hours/day Type of Home: Apartment Home Access: Level entry  Home Layout: One level Home Equipment: Rollator (4 wheels);Shower seat;Cane - single point      Prior Function Prior Level of Function : Driving;Independent/Modified Independent             Mobility Comments: Uses  rollator for mobility vs no AD ADLs Comments: reports independence     Hand Dominance   Dominant Hand: Right    Extremity/Trunk Assessment   Upper Extremity Assessment Upper Extremity Assessment: Generalized weakness    Lower Extremity Assessment Lower Extremity Assessment: Generalized weakness (R LE weaker and less coordinated)    Cervical / Trunk Assessment Cervical / Trunk Assessment: Other exceptions Cervical / Trunk Exceptions: increased body habitus  Communication   Communication: No difficulties  Cognition Arousal/Alertness: Awake/alert Behavior During Therapy: WFL for tasks assessed/performed Overall Cognitive Status: Within Functional Limits for tasks assessed                                          General Comments General comments (skin integrity, edema, etc.): HR in the 120's, SpO2 91% on 1L [place] and 95% on 2L [place] during activity.    Exercises     Assessment/Plan    PT Assessment Patient needs continued PT services  PT Problem List Decreased strength;Decreased activity tolerance;Decreased mobility;Decreased balance;Decreased knowledge of use of DME;Cardiopulmonary status limiting activity       PT Treatment Interventions DME instruction;Gait training;Stair training;Functional mobility training;Therapeutic activities;Balance training;Patient/family education    PT Goals (Current goals can be found in the Care Plan section)  Acute Rehab PT Goals Patient Stated Goal: back home PT Goal Formulation: With patient Time For Goal Achievement: 05/28/22 Potential to Achieve Goals: Good    Frequency Min 3X/week     Co-evaluation               AM-PAC PT "6 Clicks" Mobility  Outcome Measure Help needed turning from your back to your side while in a flat bed without using bedrails?: A Little Help needed moving from lying on your back to sitting on the side of a flat bed without using bedrails?: A Little Help needed moving to and from a  bed to a chair (including a wheelchair)?: A Little Help needed standing up from a chair using your arms (e.g., wheelchair or bedside chair)?: A Little Help needed to walk in hospital room?: A Little Help needed climbing 3-5 steps with a railing? : A Little 6 Click Score: 18    End of Session   Activity Tolerance: Patient tolerated treatment well;Patient limited by fatigue Patient left: in bed;with call bell/phone within reach Nurse Communication: Mobility status PT Visit Diagnosis: Unsteadiness on feet (R26.81);Difficulty in walking, not elsewhere classified (R26.2)    Time: 1735-1801 PT Time Calculation (min) (ACUTE ONLY): 26 min   Charges:   PT Evaluation $PT Eval Moderate Complexity: 1 Mod PT Treatments $Gait Training: 8-22 mins        05/14/2022  Jacinto Halim., PT Acute Rehabilitation Services [phone]  (pager) [phone]  (office)  Eliseo Gum Deairra Halleck 05/14/2022, 6:11 PM

## 2022-05-15 ENCOUNTER — Encounter (HOSPITAL_COMMUNITY): Payer: Self-pay | Admitting: Internal Medicine

## 2022-05-15 DIAGNOSIS — N1831 Chronic kidney disease, stage 3a: Secondary | ICD-10-CM | POA: Diagnosis not present

## 2022-05-15 DIAGNOSIS — I4891 Unspecified atrial fibrillation: Secondary | ICD-10-CM | POA: Diagnosis not present

## 2022-05-15 DIAGNOSIS — I509 Heart failure, unspecified: Secondary | ICD-10-CM | POA: Diagnosis not present

## 2022-05-15 DIAGNOSIS — I5041 Acute combined systolic (congestive) and diastolic (congestive) heart failure: Secondary | ICD-10-CM | POA: Diagnosis not present

## 2022-05-15 DIAGNOSIS — I4811 Longstanding persistent atrial fibrillation: Secondary | ICD-10-CM | POA: Diagnosis not present

## 2022-05-15 DIAGNOSIS — R778 Other specified abnormalities of plasma proteins: Secondary | ICD-10-CM | POA: Diagnosis not present

## 2022-05-15 LAB — CBC
HCT: 38.8 % — ABNORMAL LOW (ref 39.0–52.0)
Hemoglobin: 13 g/dL (ref 13.0–17.0)
MCH: 30.4 pg (ref 26.0–34.0)
MCHC: 33.5 g/dL (ref 30.0–36.0)
MCV: 90.7 fL (ref 80.0–100.0)
Platelets: 214 10*3/uL (ref 150–400)
RBC: 4.28 MIL/uL (ref 4.22–5.81)
RDW: 14 % (ref 11.5–15.5)
WBC: 9.1 10*3/uL (ref 4.0–10.5)
nRBC: 0 % (ref 0.0–0.2)

## 2022-05-15 LAB — GLUCOSE, CAPILLARY
Glucose-Capillary: 107 mg/dL — ABNORMAL HIGH (ref 70–99)
Glucose-Capillary: 116 mg/dL — ABNORMAL HIGH (ref 70–99)
Glucose-Capillary: 118 mg/dL — ABNORMAL HIGH (ref 70–99)
Glucose-Capillary: 146 mg/dL — ABNORMAL HIGH (ref 70–99)
Glucose-Capillary: 137 mg/dL — ABNORMAL HIGH (ref 70–99)

## 2022-05-15 LAB — BASIC METABOLIC PANEL
Anion gap: 13 (ref 5–15)
BUN: 17 mg/dL (ref 8–23)
CO2: 24 mmol/L (ref 22–32)
Calcium: 8.7 mg/dL — ABNORMAL LOW (ref 8.9–10.3)
Chloride: 103 mmol/L (ref 98–111)
Creatinine, Ser: 1.79 mg/dL — ABNORMAL HIGH (ref 0.61–1.24)
GFR, Estimated: 38 mL/min — ABNORMAL LOW (ref >60)
Glucose, Bld: 132 mg/dL — ABNORMAL HIGH (ref 70–99)
Potassium: 4.8 mmol/L (ref 3.5–5.1)
Sodium: 140 mmol/L (ref 135–145)

## 2022-05-15 MED ORDER — ISOSORBIDE MONONITRATE ER 30 MG PO TB24
30.0000 mg | ORAL_TABLET | Freq: Every day | ORAL | Status: DC
  Administered 2022-05-15 – 2022-05-16 (×2): 30 mg via ORAL
  Filled 2022-05-15 (×2): qty 1

## 2022-05-15 MED ORDER — CEFDINIR 300 MG PO CAPS
300.0000 mg | ORAL_CAPSULE | Freq: Two times a day (BID) | ORAL | Status: AC
  Administered 2022-05-15 – 2022-05-16 (×4): 300 mg via ORAL
  Filled 2022-05-15 (×4): qty 1

## 2022-05-15 MED ORDER — APIXABAN 2.5 MG PO TABS
2.5000 mg | ORAL_TABLET | Freq: Two times a day (BID) | ORAL | Status: AC
  Administered 2022-05-15 – 2022-05-17 (×5): 2.5 mg via ORAL
  Filled 2022-05-15 (×5): qty 1

## 2022-05-15 MED ORDER — SACUBITRIL-VALSARTAN 24-26 MG PO TABS
1.0000 | ORAL_TABLET | Freq: Two times a day (BID) | ORAL | Status: DC
  Administered 2022-05-16 – 2022-05-20 (×9): 1 via ORAL
  Filled 2022-05-15 (×9): qty 1

## 2022-05-15 NOTE — Progress Notes (Signed)
Heart Failure Nurse Navigator Progress Note  Assessed for HV TOC readiness. Completed SDOH, no immediate needs noted. Pt agreeable to HV TOC clinic follow up appt upon DC. Scheduled for 9/11 @ 11AM then will plan to follow up with Dr. Shirlee Latch in AHF clinic.   Pt previously seen in HV TOC 11/2021, referred to AHF clinic for possible cardiomems.   Education Assessment and Provision:  Detailed education and instructions provided on heart failure disease management including the following:  Signs and symptoms of Heart Failure When to call the physician Importance of daily weights Low sodium diet Fluid restriction Medication management Anticipated future follow-up appointments  Patient education given on each of the above topics.  Patient acknowledges understanding via teach back method and acceptance of all instructions.  Education Materials:  "Living Better With Heart Failure" Booklet, HF zone tool, & Daily Weight Tracker Tool.  Patient has scale at home: yes Patient has pill box at home: yes  Ozella Rocks, MSN, RN

## 2022-05-15 NOTE — Progress Notes (Signed)
Mobility Specialist Progress Note:   05/15/22 1140  Mobility  Activity Ambulated with assistance in hallway  Level of Assistance Modified independent, requires aide device or extra time  Assistive Device Front wheel walker  Distance Ambulated (ft) 200 ft  Activity Response Tolerated well  $Mobility charge 1 Mobility   Pt received in bed willing to participate in mobility. No complaints of pain. Left in bed with call bell in reach and all needs met.     Mobility Specialist  

## 2022-05-15 NOTE — TOC Progression Note (Signed)
Transition of Care [facility]) - Progression Note    Patient Details  Name: Albert Powell MRN: [id number] Date of Birth: December 15, 1940  [facility]) CM/SW Contact  Leone Haven, RN Phone Number: 05/15/2022, 4:55 PM  Clinical Narrative:    NCM spoke with patient at bedside ,offered choice with medicare.gov list, patient refused HH services.  He lives with wife at home.  Presents with chf, aifb, conts on iv abx, for pna, adjusting bp meds.  TOC following.    Expected Discharge Plan: Home w Home Health Services Barriers to Discharge: Continued Medical Work up  Expected Discharge Plan and Services Expected Discharge Plan: Home w Home Health Services   Discharge Planning Services: CM Consult Post Acute Care Choice: Home Health Living arrangements for the past 2 months: Single Family Home                   DME Agency: NA       HH Arranged: PT, OT, Refused HH           Social Determinants of Health (SDOH) Interventions Food Insecurity Interventions: Intervention Not Indicated Financial Strain Interventions: Intervention Not Indicated Housing Interventions: Intervention Not Indicated Transportation Interventions: Intervention Not Indicated  Readmission Risk Interventions     No data to display

## 2022-05-15 NOTE — Progress Notes (Signed)
Progress Note   Patient Name: Albert Powell Date of Encounter: 05/15/2022  Primary Cardiologist: Nanetta Batty, MD   Subjective   Pt was seen today in the AM while he was eating breakfast. He states he is feeling much better than when he came in, and he denies any dyspnea or chest pain at this moment. Telemetry readings he is still in Afib.   Inpatient Medications    Scheduled Meds:  apixaban  5 mg Oral BID   dapagliflozin propanediol  10 mg Oral Daily   dextromethorphan-guaiFENesin  1 tablet Oral BID   furosemide  40 mg Intravenous Q12H   insulin aspart  0-5 Units Subcutaneous QHS   insulin aspart  0-9 Units Subcutaneous TID WC   levothyroxine  25 mcg Oral Q0600   metoprolol tartrate  12.5 mg Oral BID   pantoprazole  40 mg Oral QODAY   sacubitril-valsartan  1 tablet Oral BID   Continuous Infusions:  azithromycin 500 mg (05/14/22 0949)   cefTRIAXone (ROCEPHIN)  IV 1 g (05/14/22 0836)   PRN Meds: acetaminophen **OR** acetaminophen, guaiFENesin-dextromethorphan   Vital Signs    Vitals:   05/14/22 1850 05/14/22 1855 05/14/22 1915 05/14/22 2050  BP:   (!) 149/61 (!) 152/79  Pulse:    82  Resp: (!) 22 (!) 21 17 20   Temp:    97.9 F (36.6 C)  TempSrc:    Oral  SpO2:      Weight:      Height:        Intake/Output Summary (Last 24 hours) at 05/15/2022 0805 Last data filed at 05/15/2022 05/17/2022 Gross per 24 hour  Intake 1787 ml  Output 2600 ml  Net -813 ml      05/14/2022    4:00 AM 05/13/2022   11:00 PM 05/13/2022    6:52 PM  Last 3 Weights  Weight (lbs) 223 lb 15.8 oz 223 lb 15.8 oz 220 lb  Weight (kg) 101.6 kg 101.6 kg 99.791 kg      Telemetry    Atrial Fibrillation - Personally Reviewed  ECG    Atrial Fibrillation with PVCs - Personally Reviewed  Physical Exam   Physical Exam Constitutional:      Appearance: He is obese.  Cardiovascular:     Rate and Rhythm: Normal rate and regular rhythm.  Pulmonary:     Effort: Pulmonary effort is normal.      Breath sounds: Normal breath sounds.  Musculoskeletal:     Cervical back: Neck supple.  Skin:    General: Skin is warm and dry.  Neurological:     Mental Status: He is alert.      Labs    Chemistry Recent Labs  Lab 05/13/22 1627 05/13/22 1639 05/14/22 0647 05/15/22 0332  NA 137 137 136 140  K 3.5 3.5 2.8* 4.8  CL 105 104 98 103  CO2 21*  --  24 24  GLUCOSE 176* 198* 175* 132*  BUN 12 15 10 17   CREATININE 1.57* 1.40* 1.53* 1.79*  CALCIUM 9.2  --  8.4* 8.7*  PROT 6.5  --  5.7*  --   ALBUMIN 3.5  --  3.1*  --   AST 14*  --  16  --   ALT 14  --  16  --   ALKPHOS 57  --  48  --   BILITOT 1.0  --  1.2  --   GFRNONAA 44*  --  46* 38*  ANIONGAP 11  --  14 13     Hematology Recent Labs  Lab 05/13/22 1627 05/13/22 1639 05/14/22 0647 05/15/22 0332  WBC 10.7*  --  10.2 9.1  RBC 4.79  --  4.23 4.28  HGB 14.4 15.3 12.7* 13.0  HCT 42.9 45.0 38.2* 38.8*  MCV 89.6  --  90.3 90.7  MCH 30.1  --  30.0 30.4  MCHC 33.6  --  33.2 33.5  RDW 13.8  --  13.7 14.0  PLT 264  --  230 214    Cardiac EnzymesNo results for input(s): "TROPONINI" in the last 168 hours. No results for input(s): "TROPIPOC" in the last 168 hours.   BNP Recent Labs  Lab 05/13/22 1627  BNP >4,500.0*     DDimer No results for input(s): "DDIMER" in the last 168 hours.   Radiology    DG Chest 2 View  Result Date: 05/14/2022 CLINICAL DATA:  Shortness of breath EXAM: CHEST - 2 VIEW COMPARISON:  Previous studies including the examination of 05/13/2022 FINDINGS: Transverse diameter of heart is slightly increased. Central pulmonary vessels are prominent without signs of alveolar pulmonary edema. There is increased density in both lower lung fields. There is slight improvement in this finding. There are no new focal infiltrates. There is no pneumothorax. There is blunting of both posterior costophrenic angles. Pain control lead is seen in thoracic spinal canal. IMPRESSION: Increased density in both lower  lung fields may be due to small bilateral effusions and possibly underlying atelectasis/pneumonia. There are no signs of alveolar pulmonary edema or new focal infiltrates. Electronically Signed   By: Elmer Picker M.D.   On: 05/14/2022 15:23   ECHOCARDIOGRAM LIMITED  Result Date: 05/14/2022    ECHOCARDIOGRAM LIMITED REPORT   Patient Name:   Albert Powell Date of Exam: 05/14/2022 Medical Rec #:  [id number]        Height:       70.0 in Accession #:    [id number]       Weight:       224.0 lb Date of Birth:  03/26/1941        BSA:          2.190 m Patient Age:    81 years         BP:           161/99 mmHg Patient Gender: M                HR:           102 bpm. Exam Location:  Inpatient Procedure: Limited Echo Indications:    CHF Acute Systolic [id number]  History:        Patient has prior history of Echocardiogram examinations, most                 recent 11/25/2021. CHF, CAD and Angina, PAD,                 Arrythmias:Bradycardia and Atrial Fibrillation; Risk                 Factors:Hypertension, Diabetes and Dyslipidemia. Elevated                 Troponin.  Sonographer:    Ronny Flurry Referring Phys: [id number] OLADAPO ADEFESO IMPRESSIONS  1. Left ventricular ejection fraction, by estimation, is 25 to 30%. The left ventricle has severely decreased function. The left ventricle demonstrates regional wall motion abnormalities (see scoring diagram/findings for description).  2. Right ventricular systolic function is normal. The  right ventricular size is normal. Tricuspid regurgitation signal is inadequate for assessing PA pressure.  3. Trivial mitral valve regurgitation. No evidence of mitral stenosis.  4. The aortic valve is tricuspid. There is mild calcification of the aortic valve. There is mild thickening of the aortic valve. Aortic valve sclerosis/calcification is present, without any evidence of aortic stenosis.  5. The inferior vena cava is dilated in size with <50% respiratory variability, suggesting right  atrial pressure of 15 mmHg. Comparison(s): Prior images reviewed side by side. The left ventricular function is significantly worse. The left ventricular wall motion abnormalities are significantly worse. FINDINGS  Left Ventricle: Left ventricular ejection fraction, by estimation, is 25 to 30%. The left ventricle has severely decreased function. The left ventricle demonstrates regional wall motion abnormalities. Severe hypokinesis of the left ventricular, basal-mid inferoseptal wall, inferior wall and anterolateral wall. Mild hypokinesis of the left ventricular, entire anteroseptal wall, anterior wall and apical segment. The left ventricular internal cavity size was normal in size. There is no left ventricular hypertrophy. Right Ventricle: The right ventricular size is normal. No increase in right ventricular wall thickness. Right ventricular systolic function is normal. Tricuspid regurgitation signal is inadequate for assessing PA pressure. Right Atrium: Right atrial size was normal in size. Pericardium: Trivial pericardial effusion is present. Mitral Valve: Mild to moderate mitral annular calcification. Trivial mitral valve regurgitation. No evidence of mitral valve stenosis. Tricuspid Valve: The tricuspid valve is normal in structure. Tricuspid valve regurgitation is not demonstrated. Aortic Valve: The aortic valve is tricuspid. There is mild calcification of the aortic valve. There is mild thickening of the aortic valve. Aortic valve sclerosis/calcification is present, without any evidence of aortic stenosis. Pulmonic Valve: The pulmonic valve was normal in structure. Pulmonic valve regurgitation is not visualized. Aorta: The aortic root is normal in size and structure. Venous: The inferior vena cava is dilated in size with less than 50% respiratory variability, suggesting right atrial pressure of 15 mmHg. IAS/Shunts: No atrial level shunt detected by color flow Doppler. Thurmon Fair MD Electronically signed  by Thurmon Fair MD Signature Date/Time: 05/14/2022/12:52:08 PM    Final    DG Chest Port 1 View  Result Date: 05/13/2022 CLINICAL DATA:  Shortness of breath, atrial fibrillation with rapid ventricular response EXAM: PORTABLE CHEST 1 VIEW COMPARISON:  Portable exam 1404 hours compared to 11/23/2021 FINDINGS: Enlargement of cardiac silhouette. Mediastinal contours and pulmonary vascularity normal. Atherosclerotic calcification aorta. Bibasilar consolidation favor multifocal pneumonia over are locked assist or edema. Upper lungs clear. No pleural effusion or pneumothorax. Intraspinal stimulator noted. IMPRESSION: Enlargement of cardiac silhouette with bibasilar infiltrates favoring multifocal pneumonia as above. Aortic Atherosclerosis (ICD10-I70.0). Electronically Signed   By: Ulyses Southward M.D.   On: 05/13/2022 14:17    Cardiac Studies   Cardiac Panel (last 3 results) Recent Labs    05/13/22 1627 05/13/22 2131  TROPONINIHS 96* 85*     Patient Profile     81 y.o. male with PMH of CAD, PAD, chronic diastolic heart failure, paroxysmal atrial fibrillation, and hypertension who presented with several days of shortness of breath.    Assessment & Plan    Acute on Chronic Diastolic Heart Failure  CKD Stage 3a Pt's echo results show an EF of 25-30%, with the LV having severely decreased function with regional wall motion abnormalities. Pt states he has had no episodes of dyspnea, and continues to require 2L of O2. He feels like he needs the oxygen, and was saturation at 95% on 2L during activity.  We will continue to diurese him and try to reduce oxygen requirements at the moment. His output from yesterday was -813, and daily weight has not been collected as of yet.   Due to EF being much lower than in March, he may have a new blockage of one of his arteries. Only way to assess is with a cath. Discussed risks and benefits of procedure with patient, and he is agreeable. Will continue to diuresis over  weekend and help him reach euvolemic state, and hope for constituent improvement of kidney function and plan for cath on Monday.  Plan:  - Continue Lasix 40 IV BID, Farxiga 10mg , Metoprolol 12.5, and Entresto   - Monitor I/O, daily weights - Fluid restricted and heart healthy diet  - Monitor Kidney function with daily BMP   Atrial Fibrillation, persistent vs paroxysmal  Pt's CHA2DS2/VAS score is 6, and he is currently rate controlled, most recent logged pulse is 65 this AM. We will continue eliquis and monitor telemetry         For questions or updates, please contact [place] Please consult [email or web address] for contact info under        Signed, Drucie Opitz, MD  05/15/2022, 8:05 AM

## 2022-05-15 NOTE — Progress Notes (Addendum)
PROGRESS NOTE    Albert Powell  [id number] DOB: 1941/01/29 DOA: 05/13/2022 PCP: Leeroy Cha, MD  81/M with history of CAD, chronic diastolic CHF, COPD, type 2 diabetes mellitus, atrial fibrillation, hypertension presented to the ED with shortness of breath for 2 to 3 days, hypoxic in the ER, noted to be in A-fib RVR, systolic blood pressure in the 240 range.  Work-up noted creatinine of 1.57, troponin of 96, BNP 4500, chest x-ray noted bibasilar infiltrates, possibly multifocal pneumonia. -Improving with diuretics, echo noted drop in EF to 25-30%  Subjective: -Feels better, breathing is improving  Assessment and Plan:  Acute on chronic diastolic CHF Hypertensive urgency -Echo 3/23 noted EF 50-55%, grade 1 DD -Repeat echo noted drop in EF to 25-30% -symptoms primarily related to CHF, doubt pneumonia -Continue IV Lasix today, cardiology following -Continue Farxiga, started on Entresto yesterday, mild bump in creatinine noted, decrease entresto dose esp with need for LHC -Plan for left heart cath when volume status has improved  Hypokalemia -Repleted  ?  Multifocal pneumonia -Clinically low suspicion for infectious process, no fever or leukocytosis, procalcitonin is low as well -Started on empiric ABX on admission, repeat chest x-ray indicating bilateral pleural effusions and atelectasis likely  -Changed to oral cefdinir to complete 5-day course  CKD 3b -Stable, monitor with diuresis, baseline creatinine around 1.4-1.7  Paroxysmal A-fib with RVR -Heart rate suboptimal, mental, continue Eliquis  Hypothyroidism -Continue Synthroid  Type 2 diabetes mellitus -Resume Farxiga, glipizide on hold  Obesity   DVT prophylaxis: Eliquis Code Status: Full code Family Communication: Discussed with patient detail, no family at bedside Disposition Plan: Home likely 48 hours  Consultants:    Procedures:   Antimicrobials:    Objective: Vitals:   05/14/22 1915  05/14/22 2050 05/15/22 0738 05/15/22 1124  BP: (!) 149/61 (!) 152/79 (!) 158/93 (!) 151/83  Pulse:  82 65 60  Resp: 17 20 20 18   Temp:  97.9 F (36.6 C) 97.7 F (36.5 C)   TempSrc:  Oral Oral   SpO2:   100% 94%  Weight:      Height:        Intake/Output Summary (Last 24 hours) at 05/15/2022 1345 Last data filed at 05/15/2022 1122 Gross per 24 hour  Intake 1667 ml  Output 2450 ml  Net -783 ml   Filed Weights   05/13/22 1852 05/13/22 2300 05/14/22 0400  Weight: 99.8 kg 101.6 kg 101.6 kg    Examination:  General exam: Obese pleasant male, sitting up in bed, AAOx3, no distress HEENT: Positive JVD CVS: S1-S2, slightly irregular rhythm Lungs: Few basilar rales  Abdomen: Soft, nontender, bowel sounds present Extremities: 1+ edema Skin: No rashes Psychiatry:  Mood & affect appropriate.     Data Reviewed:   CBC: Recent Labs  Lab 05/13/22 1627 05/13/22 1639 05/14/22 0647 05/15/22 0332  WBC 10.7*  --  10.2 9.1  NEUTROABS 9.0*  --   --   --   HGB 14.4 15.3 12.7* 13.0  HCT 42.9 45.0 38.2* 38.8*  MCV 89.6  --  90.3 90.7  PLT 264  --  230 [id number]   Basic Metabolic Panel: Recent Labs  Lab 05/13/22 1627 05/13/22 1639 05/14/22 0647 05/15/22 0332  NA 137 137 136 140  K 3.5 3.5 2.8* 4.8  CL 105 104 98 103  CO2 21*  --  24 24  GLUCOSE 176* 198* 175* 132*  BUN 12 15 10 17   CREATININE 1.57* 1.40* 1.53* 1.79*  CALCIUM 9.2  --  8.4* 8.7*  MG 2.0  --  1.9  --   PHOS  --   --  2.7  --    GFR: Estimated Creatinine Clearance: 39.3 mL/min (A) (by C-G formula based on SCr of 1.79 mg/dL (H)). Liver Function Tests: Recent Labs  Lab 05/13/22 1627 05/14/22 0647  AST 14* 16  ALT 14 16  ALKPHOS 57 48  BILITOT 1.0 1.2  PROT 6.5 5.7*  ALBUMIN 3.5 3.1*   Recent Labs  Lab 05/13/22 1627  LIPASE 24   No results for input(s): "AMMONIA" in the last 168 hours. Coagulation Profile: No results for input(s): "INR", "PROTIME" in the last 168 hours. Cardiac Enzymes: No results  for input(s): "CKTOTAL", "CKMB", "CKMBINDEX", "TROPONINI" in the last 168 hours. BNP (last 3 results) No results for input(s): "PROBNP" in the last 8760 hours. HbA1C: Recent Labs    05/14/22 0647  HGBA1C 7.2*   CBG: Recent Labs  Lab 05/14/22 1146 05/14/22 1632 05/14/22 2056 05/15/22 0647 05/15/22 1120  GLUCAP 162* 115* 141* 107* 116*   Lipid Profile: No results for input(s): "CHOL", "HDL", "LDLCALC", "TRIG", "CHOLHDL", "LDLDIRECT" in the last 72 hours. Thyroid Function Tests: No results for input(s): "TSH", "T4TOTAL", "FREET4", "T3FREE", "THYROIDAB" in the last 72 hours. Anemia Panel: No results for input(s): "VITAMINB12", "FOLATE", "FERRITIN", "TIBC", "IRON", "RETICCTPCT" in the last 72 hours. Urine analysis:    Component Value Date/Time   COLORURINE YELLOW 01/01/2021 1351   APPEARANCEUR CLEAR 01/01/2021 1351   LABSPEC 1.009 01/01/2021 1351   PHURINE 6.0 01/01/2021 1351   GLUCOSEU >=500 (A) 01/01/2021 1351   HGBUR SMALL (A) 01/01/2021 1351   BILIRUBINUR NEGATIVE 01/01/2021 1351   KETONESUR NEGATIVE 01/01/2021 1351   PROTEINUR >=300 (A) 01/01/2021 1351   UROBILINOGEN 1.0 05/31/2013 1432   NITRITE NEGATIVE 01/01/2021 1351   LEUKOCYTESUR NEGATIVE 01/01/2021 1351   Sepsis Labs: @LABRCNTIP (procalcitonin:4,lacticidven:4)  ) Recent Results (from the past 240 hour(s))  Resp Panel by RT-PCR (Flu A&B, Covid) Anterior Nasal Swab     Status: None   Collection Time: 05/13/22  2:26 PM   Specimen: Anterior Nasal Swab  Result Value Ref Range Status   SARS Coronavirus 2 by RT PCR NEGATIVE NEGATIVE Final    Comment: (NOTE) SARS-CoV-2 target nucleic acids are NOT DETECTED.  The SARS-CoV-2 RNA is generally detectable in upper respiratory specimens during the acute phase of infection. The lowest concentration of SARS-CoV-2 viral copies this assay can detect is 138 copies/mL. A negative result does not preclude SARS-Cov-2 infection and should not be used as the sole basis for  treatment or other patient management decisions. A negative result may occur with  improper specimen collection/handling, submission of specimen other than nasopharyngeal swab, presence of viral mutation(s) within the areas targeted by this assay, and inadequate number of viral copies(<138 copies/mL). A negative result must be combined with clinical observations, patient history, and epidemiological information. The expected result is Negative.  Fact Sheet for Patients:  05/15/22  Fact Sheet for Healthcare Providers:  BloggerCourse.com  This test is no t yet approved or cleared by the [employer] FDA and  has been authorized for detection and/or diagnosis of SARS-CoV-2 by FDA under an Emergency Use Authorization (EUA). This EUA will remain  in effect (meaning this test can be used) for the duration of the COVID-19 declaration under Section 564(b)(1) of the Act, 21 U.S.C.section 360bbb-3(b)(1), unless the authorization is terminated  or revoked sooner.       Influenza A by PCR NEGATIVE NEGATIVE Final  Influenza B by PCR NEGATIVE NEGATIVE Final    Comment: (NOTE) The Xpert Xpress SARS-CoV-2/FLU/RSV plus assay is intended as an aid in the diagnosis of influenza from Nasopharyngeal swab specimens and should not be used as a sole basis for treatment. Nasal washings and aspirates are unacceptable for Xpert Xpress SARS-CoV-2/FLU/RSV testing.  Fact Sheet for Patients: EntrepreneurPulse.com.au  Fact Sheet for Healthcare Providers: IncredibleEmployment.be  This test is not yet approved or cleared by the [place] FDA and has been authorized for detection and/or diagnosis of SARS-CoV-2 by FDA under an Emergency Use Authorization (EUA). This EUA will remain in effect (meaning this test can be used) for the duration of the COVID-19 declaration under Section 564(b)(1) of the Act, 21  U.S.C. section 360bbb-3(b)(1), unless the authorization is terminated or revoked.  Performed at [facility], [place] [address] [place], [place] [address]   Blood culture (routine x 2)     Status: None (Preliminary result)   Collection Time: 05/13/22  4:27 PM   Specimen: BLOOD  Result Value Ref Range Status   Specimen Description BLOOD RIGHT ANTECUBITAL  Final   Special Requests   Final    BOTTLES DRAWN AEROBIC AND ANAEROBIC Blood Culture results may not be optimal due to an excessive volume of blood received in culture bottles   Culture   Final    NO GROWTH 2 DAYS Performed at [facility], [place] [address], [place], [place] [address]    Report Status PENDING  Incomplete  Blood culture (routine x 2)     Status: None (Preliminary result)   Collection Time: 05/13/22  4:27 PM   Specimen: BLOOD LEFT HAND  Result Value Ref Range Status   Specimen Description BLOOD LEFT HAND  Final   Special Requests   Final    BOTTLES DRAWN AEROBIC AND ANAEROBIC Blood Culture adequate volume   Culture   Final    NO GROWTH 2 DAYS Performed at [facility], [place] [address] [place], [place] [address]    Report Status PENDING  Incomplete     Radiology Studies: DG Chest 2 View  Result Date: 05/14/2022 CLINICAL DATA:  Shortness of breath EXAM: CHEST - 2 VIEW COMPARISON:  Previous studies including the examination of 05/13/2022 FINDINGS: Transverse diameter of heart is slightly increased. Central pulmonary vessels are prominent without signs of alveolar pulmonary edema. There is increased density in both lower lung fields. There is slight improvement in this finding. There are no new focal infiltrates. There is no pneumothorax. There is blunting of both posterior costophrenic angles. Pain control lead is seen in thoracic spinal canal. IMPRESSION: Increased density in both lower lung fields may be due to small bilateral effusions and possibly underlying atelectasis/pneumonia. There are no signs  of alveolar pulmonary edema or new focal infiltrates. Electronically Signed   By: Elmer Picker M.D.   On: 05/14/2022 15:23   ECHOCARDIOGRAM LIMITED  Result Date: 05/14/2022    ECHOCARDIOGRAM LIMITED REPORT   Patient Name:   Albert Powell Date of Exam: 05/14/2022 Medical Rec #:  [id number]        Height:       70.0 in Accession #:    [id number]       Weight:       224.0 lb Date of Birth:  [birth year]        BSA:          2.190 m Patient Age:    80 years  BP:           161/99 mmHg Patient Gender: M                HR:           102 bpm. Exam Location:  Inpatient Procedure: Limited Echo Indications:    CHF Acute Systolic [id number]  History:        Patient has prior history of Echocardiogram examinations, most                 recent 11/25/2021. CHF, CAD and Angina, PAD,                 Arrythmias:Bradycardia and Atrial Fibrillation; Risk                 Factors:Hypertension, Diabetes and Dyslipidemia. Elevated                 Troponin.  Sonographer:    Ronny Flurry Referring Phys: [id number] OLADAPO ADEFESO IMPRESSIONS  1. Left ventricular ejection fraction, by estimation, is 25 to 30%. The left ventricle has severely decreased function. The left ventricle demonstrates regional wall motion abnormalities (see scoring diagram/findings for description).  2. Right ventricular systolic function is normal. The right ventricular size is normal. Tricuspid regurgitation signal is inadequate for assessing PA pressure.  3. Trivial mitral valve regurgitation. No evidence of mitral stenosis.  4. The aortic valve is tricuspid. There is mild calcification of the aortic valve. There is mild thickening of the aortic valve. Aortic valve sclerosis/calcification is present, without any evidence of aortic stenosis.  5. The inferior vena cava is dilated in size with <50% respiratory variability, suggesting right atrial pressure of 15 mmHg. Comparison(s): Prior images reviewed side by side. The left ventricular function is  significantly worse. The left ventricular wall motion abnormalities are significantly worse. FINDINGS  Left Ventricle: Left ventricular ejection fraction, by estimation, is 25 to 30%. The left ventricle has severely decreased function. The left ventricle demonstrates regional wall motion abnormalities. Severe hypokinesis of the left ventricular, basal-mid inferoseptal wall, inferior wall and anterolateral wall. Mild hypokinesis of the left ventricular, entire anteroseptal wall, anterior wall and apical segment. The left ventricular internal cavity size was normal in size. There is no left ventricular hypertrophy. Right Ventricle: The right ventricular size is normal. No increase in right ventricular wall thickness. Right ventricular systolic function is normal. Tricuspid regurgitation signal is inadequate for assessing PA pressure. Right Atrium: Right atrial size was normal in size. Pericardium: Trivial pericardial effusion is present. Mitral Valve: Mild to moderate mitral annular calcification. Trivial mitral valve regurgitation. No evidence of mitral valve stenosis. Tricuspid Valve: The tricuspid valve is normal in structure. Tricuspid valve regurgitation is not demonstrated. Aortic Valve: The aortic valve is tricuspid. There is mild calcification of the aortic valve. There is mild thickening of the aortic valve. Aortic valve sclerosis/calcification is present, without any evidence of aortic stenosis. Pulmonic Valve: The pulmonic valve was normal in structure. Pulmonic valve regurgitation is not visualized. Aorta: The aortic root is normal in size and structure. Venous: The inferior vena cava is dilated in size with less than 50% respiratory variability, suggesting right atrial pressure of 15 mmHg. IAS/Shunts: No atrial level shunt detected by color flow Doppler. Sanda Klein MD Electronically signed by Sanda Klein MD Signature Date/Time: 05/14/2022/12:52:08 PM    Final    DG Chest Port 1 View  Result  Date: 05/13/2022 CLINICAL DATA:  Shortness of breath, atrial fibrillation with rapid ventricular response  EXAM: PORTABLE CHEST 1 VIEW COMPARISON:  Portable exam 1404 hours compared to 11/23/2021 FINDINGS: Enlargement of cardiac silhouette. Mediastinal contours and pulmonary vascularity normal. Atherosclerotic calcification aorta. Bibasilar consolidation favor multifocal pneumonia over are locked assist or edema. Upper lungs clear. No pleural effusion or pneumothorax. Intraspinal stimulator noted. IMPRESSION: Enlargement of cardiac silhouette with bibasilar infiltrates favoring multifocal pneumonia as above. Aortic Atherosclerosis (ICD10-I70.0). Electronically Signed   By: Ulyses Southward M.D.   On: 05/13/2022 14:17     Scheduled Meds:  apixaban  5 mg Oral BID   dapagliflozin propanediol  10 mg Oral Daily   dextromethorphan-guaiFENesin  1 tablet Oral BID   furosemide  40 mg Intravenous Q12H   insulin aspart  0-5 Units Subcutaneous QHS   insulin aspart  0-9 Units Subcutaneous TID WC   isosorbide mononitrate  30 mg Oral Daily   levothyroxine  25 mcg Oral Q0600   metoprolol tartrate  12.5 mg Oral BID   pantoprazole  40 mg Oral QODAY   sacubitril-valsartan  1 tablet Oral BID   Continuous Infusions:  azithromycin 500 mg (05/15/22 1121)   cefTRIAXone (ROCEPHIN)  IV 1 g (05/15/22 0917)     LOS: 2 days    Time spent:  Zannie Cove, MD [facility]   05/15/2022, 1:45 PM

## 2022-05-16 DIAGNOSIS — I2510 Atherosclerotic heart disease of native coronary artery without angina pectoris: Secondary | ICD-10-CM

## 2022-05-16 DIAGNOSIS — N1831 Chronic kidney disease, stage 3a: Secondary | ICD-10-CM | POA: Diagnosis not present

## 2022-05-16 DIAGNOSIS — I10 Essential (primary) hypertension: Secondary | ICD-10-CM | POA: Diagnosis not present

## 2022-05-16 DIAGNOSIS — I4811 Longstanding persistent atrial fibrillation: Secondary | ICD-10-CM | POA: Diagnosis not present

## 2022-05-16 DIAGNOSIS — I5041 Acute combined systolic (congestive) and diastolic (congestive) heart failure: Secondary | ICD-10-CM

## 2022-05-16 DIAGNOSIS — R778 Other specified abnormalities of plasma proteins: Secondary | ICD-10-CM | POA: Diagnosis not present

## 2022-05-16 LAB — LIPID PANEL
Cholesterol: 136 mg/dL (ref 0–200)
HDL: 43 mg/dL (ref >40)
LDL Cholesterol: 70 mg/dL (ref 0–99)
Total CHOL/HDL Ratio: 3.2 RATIO
Triglycerides: 115 mg/dL (ref <150)
VLDL: 23 mg/dL (ref 0–40)

## 2022-05-16 LAB — GLUCOSE, CAPILLARY
Glucose-Capillary: 118 mg/dL — ABNORMAL HIGH (ref 70–99)
Glucose-Capillary: 142 mg/dL — ABNORMAL HIGH (ref 70–99)
Glucose-Capillary: 142 mg/dL — ABNORMAL HIGH (ref 70–99)
Glucose-Capillary: 183 mg/dL — ABNORMAL HIGH (ref 70–99)

## 2022-05-16 LAB — CBC
HCT: 41.8 % (ref 39.0–52.0)
Hemoglobin: 13.7 g/dL (ref 13.0–17.0)
MCH: 29.8 pg (ref 26.0–34.0)
MCHC: 32.8 g/dL (ref 30.0–36.0)
MCV: 91.1 fL (ref 80.0–100.0)
Platelets: 233 10*3/uL (ref 150–400)
RBC: 4.59 MIL/uL (ref 4.22–5.81)
RDW: 14 % (ref 11.5–15.5)
WBC: 9.8 10*3/uL (ref 4.0–10.5)
nRBC: 0 % (ref 0.0–0.2)

## 2022-05-16 LAB — BASIC METABOLIC PANEL
Anion gap: 11 (ref 5–15)
BUN: 21 mg/dL (ref 8–23)
CO2: 24 mmol/L (ref 22–32)
Calcium: 8.3 mg/dL — ABNORMAL LOW (ref 8.9–10.3)
Chloride: 102 mmol/L (ref 98–111)
Creatinine, Ser: 1.72 mg/dL — ABNORMAL HIGH (ref 0.61–1.24)
GFR, Estimated: 40 mL/min — ABNORMAL LOW (ref >60)
Glucose, Bld: 119 mg/dL — ABNORMAL HIGH (ref 70–99)
Potassium: 3.4 mmol/L — ABNORMAL LOW (ref 3.5–5.1)
Sodium: 137 mmol/L (ref 135–145)

## 2022-05-16 LAB — LEGIONELLA PNEUMOPHILA SEROGP 1 UR AG: L. pneumophila Serogp 1 Ur Ag: NEGATIVE

## 2022-05-16 MED ORDER — HYDRALAZINE HCL 50 MG PO TABS
50.0000 mg | ORAL_TABLET | Freq: Three times a day (TID) | ORAL | Status: DC
Start: 2022-05-16 — End: 2022-05-18
  Administered 2022-05-16 – 2022-05-17 (×6): 50 mg via ORAL
  Filled 2022-05-16 (×6): qty 1

## 2022-05-16 MED ORDER — ISOSORBIDE MONONITRATE ER 30 MG PO TB24
30.0000 mg | ORAL_TABLET | Freq: Once | ORAL | Status: AC
  Administered 2022-05-16: 30 mg via ORAL
  Filled 2022-05-16: qty 1

## 2022-05-16 MED ORDER — HYDRALAZINE HCL 20 MG/ML IJ SOLN
10.0000 mg | Freq: Four times a day (QID) | INTRAMUSCULAR | Status: DC | PRN
Start: 2022-05-16 — End: 2022-05-21
  Administered 2022-05-18 – 2022-05-20 (×2): 10 mg via INTRAVENOUS
  Filled 2022-05-16 (×2): qty 1

## 2022-05-16 MED ORDER — POTASSIUM CHLORIDE CRYS ER 20 MEQ PO TBCR
40.0000 meq | EXTENDED_RELEASE_TABLET | Freq: Two times a day (BID) | ORAL | Status: AC
Start: 2022-05-16 — End: 2022-05-16
  Administered 2022-05-16 (×2): 40 meq via ORAL
  Filled 2022-05-16 (×2): qty 2

## 2022-05-16 MED ORDER — ATORVASTATIN CALCIUM 40 MG PO TABS
40.0000 mg | ORAL_TABLET | Freq: Every day | ORAL | Status: DC
Start: 2022-05-16 — End: 2022-05-21
  Administered 2022-05-16 – 2022-05-21 (×6): 40 mg via ORAL
  Filled 2022-05-16 (×6): qty 1

## 2022-05-16 MED ORDER — ISOSORBIDE MONONITRATE ER 60 MG PO TB24
60.0000 mg | ORAL_TABLET | Freq: Every day | ORAL | Status: DC
  Administered 2022-05-17 – 2022-05-20 (×4): 60 mg via ORAL
  Filled 2022-05-16 (×4): qty 1

## 2022-05-16 MED ORDER — CLOPIDOGREL BISULFATE 75 MG PO TABS
75.0000 mg | ORAL_TABLET | Freq: Every day | ORAL | Status: DC
  Administered 2022-05-16 – 2022-05-20 (×5): 75 mg via ORAL
  Filled 2022-05-16 (×5): qty 1

## 2022-05-16 NOTE — Progress Notes (Signed)
Occupational Therapy Treatment Patient Details Name: COLTEN DESROCHES MRN: [id number] DOB: 09/18/1940 Today's Date: 05/16/2022   History of present illness Patient is a 81 yo male presenting to the ED with increased SOB and HR in 130s on 05/13/22. Admitted with CHF exacerbation same day. PMH includes: CAD, chronic diastolic CHF, essential hypertension, hyperlipidemia, A-fib, COPD and T2DM   OT comments  Pt. Seen for skilled OT treatment session. Session focused on energy conservation strategies and CHF management strategies.  Handouts provided.  Pt. Receptive to education and able to state strategies for both.     Recommendations for follow up therapy are one component of a multi-disciplinary discharge planning process, led by the attending physician.  Recommendations may be updated based on patient status, additional functional criteria and insurance authorization.    Follow Up Recommendations  Home health OT    Assistance Recommended at Discharge Intermittent Supervision/Assistance  Patient can return home with the following  A little help with walking and/or transfers;A little help with bathing/dressing/bathroom;Assist for transportation;Help with stairs or ramp for entrance   Equipment Recommendations  None recommended by OT    Recommendations for Other Services      Precautions / Restrictions Precautions Precautions: Fall Precaution Comments: watch HR and O2 Restrictions Weight Bearing Restrictions: No       Mobility Bed Mobility               General bed mobility comments: pt. in bed for session, had recently returned from b.room and had been placed back on o2 secondary to reported o2 fluctuations    Transfers                         Balance                                           ADL either performed or assessed with clinical judgement   ADL Overall ADL's : Needs assistance/impaired                                        General ADL Comments: focus of session energy conservation education with hand outs provided and CHF management strategies education-pt. receptive and able to state 3 CHF strategies (states he needs to pay attention for fatigue, SOB, and weight gain) aware of need for daily weights has a system in place to write it on a daily calendar but reports he was not beind as consistent wtih it but plans to resume it once home    Extremity/Trunk Assessment              Vision       Perception     Praxis      Cognition Arousal/Alertness: Awake/alert Behavior During Therapy: WFL for tasks assessed/performed Overall Cognitive Status: Within Functional Limits for tasks assessed                                          Exercises      Shoulder Instructions       General Comments      Pertinent Vitals/ Pain       Pain Assessment Pain Assessment: No/denies pain  Home Living                                          Prior Functioning/Environment              Frequency  Min 2X/week        Progress Toward Goals  OT Goals(current goals can now be found in the care plan section)  Progress towards OT goals: Progressing toward goals     Plan      Co-evaluation                 AM-PAC OT "6 Clicks" Daily Activity     Outcome Measure   Help from another person eating meals?: A Little Help from another person taking care of personal grooming?: A Little Help from another person toileting, which includes using toliet, bedpan, or urinal?: A Little Help from another person bathing (including washing, rinsing, drying)?: A Little Help from another person to put on and taking off regular upper body clothing?: A Little Help from another person to put on and taking off regular lower body clothing?: A Little 6 Click Score: 18    End of Session    OT Visit Diagnosis: Unsteadiness on feet (R26.81);Other abnormalities of gait and  mobility (R26.89);Muscle weakness (generalized) (M62.81)   Activity Tolerance Patient tolerated treatment well   Patient Left in bed;with call bell/phone within reach;with bed alarm set   Nurse Communication          Time: 1005-1015 OT Time Calculation (min): 10 min  Charges: OT General Charges $OT Visit: 1 Visit OT Treatments $Self Care/Home Management : 8-22 mins  Boneta Lucks, COTA/L Acute Rehabilitation [phone]   Salvadore Oxford 05/16/2022, 11:39 AM

## 2022-05-16 NOTE — Progress Notes (Signed)
Physical Therapy Treatment Patient Details Name: Albert Powell MRN: [id number] DOB: [birth year] Today's Date: 05/16/2022   History of Present Illness Patient is a 81 yo male presenting to the ED with increased SOB and HR in 130s on 05/13/22. Admitted with CHF exacerbation same day. PMH includes: CAD, chronic diastolic CHF, essential hypertension, hyperlipidemia, A-fib, COPD and T2DM    PT Comments    Today's skilled session continued to focus on mobility progression with slight increase in gait distance. Cues for pacing and breathing technique with mobility. VSS with session. Acute PT to continue during pt's hospital stay.    Recommendations for follow up therapy are one component of a multi-disciplinary discharge planning process, led by the attending physician.  Recommendations may be updated based on patient status, additional functional criteria and insurance authorization.  Follow Up Recommendations  Home health PT     Assistance Recommended at Discharge Intermittent Supervision/Assistance  Patient can return home with the following A little help with walking and/or transfers;A little help with bathing/dressing/bathroom;Assistance with feeding;Assist for transportation;Help with stairs or ramp for entrance   Equipment Recommendations  None recommended by PT    Precautions / Restrictions Precautions Precautions: Fall Precaution Comments: watch HR and O2 Restrictions Weight Bearing Restrictions: No     Mobility  Bed Mobility Overal bed mobility: Modified Independent Bed Mobility: Supine to Sit, Sit to Supine     Supine to sit: Modified independent (Device/Increase time) Sit to supine: Modified independent (Device/Increase time)   General bed mobility comments: increased time needed with rail used and HOB 30 degrees    Transfers Overall transfer level: Needs assistance Equipment used: Rolling walker (2 wheels) Transfers: Sit to/from Stand Sit to Stand: Min guard            General transfer comment: cues for weight shifting to assist with power up though LE's into standing.    Ambulation/Gait Ambulation/Gait assistance: Min guard Gait Distance (Feet): 130 Feet Assistive device: Rollator (4 wheels) Gait Pattern/deviations: Step-through pattern, Trunk flexed       General Gait Details: cues for safe hand placement on rollator handles/brakes, to stay closer to rollator with gait and for more upright posture.        Cognition Arousal/Alertness: Awake/alert Behavior During Therapy: WFL for tasks assessed/performed Overall Cognitive Status: Within Functional Limits for tasks assessed             Pertinent Vitals/Pain Pain Assessment Pain Assessment: No/denies pain     PT Goals (current goals can now be found in the care plan section) Acute Rehab PT Goals Patient Stated Goal: back home PT Goal Formulation: With patient Time For Goal Achievement: 05/28/22 Potential to Achieve Goals: Good Progress towards PT goals: Progressing toward goals    Frequency    Min 3X/week      PT Plan Current plan remains appropriate    AM-PAC PT "6 Clicks" Mobility   Outcome Measure  Help needed turning from your back to your side while in a flat bed without using bedrails?: A Little Help needed moving from lying on your back to sitting on the side of a flat bed without using bedrails?: A Little Help needed moving to and from a bed to a chair (including a wheelchair)?: A Little Help needed standing up from a chair using your arms (e.g., wheelchair or bedside chair)?: A Little Help needed to walk in hospital room?: A Little Help needed climbing 3-5 steps with a railing? : A Little 6 Click Score: 18  End of Session Equipment Utilized During Treatment: Gait belt Activity Tolerance: Patient tolerated treatment well;Patient limited by fatigue Patient left: in bed;with call bell/phone within reach Nurse Communication: Mobility status PT Visit  Diagnosis: Unsteadiness on feet (R26.81);Difficulty in walking, not elsewhere classified (R26.2)     Time: 7867-5449 PT Time Calculation (min) (ACUTE ONLY): 15 min  Charges:  $Gait Training: 8-22 mins                    Sallyanne Kuster, PTA, CLT Acute [employer] [phone] 05/16/22, 1:32 PM  Sallyanne Kuster 05/16/2022, 1:31 PM

## 2022-05-16 NOTE — Progress Notes (Addendum)
PROGRESS NOTE    Albert Powell  NWG:[id number] DOB: August 30, 1941 DOA: 05/13/2022 PCP: [name], MD  81/M with history of CAD, chronic diastolic CHF, COPD, type 2 diabetes mellitus, atrial fibrillation, hypertension presented to the ED with shortness of breath for 2 to 3 days, hypoxic in the ER, noted to be in A-fib RVR, systolic blood pressure in the 240 range.  Work-up noted creatinine of 1.57, troponin of 96, BNP 4500, chest x-ray noted bibasilar infiltrates, possibly multifocal pneumonia. -Improving with diuretics, echo noted drop in EF to 25-30% -Cards consulting, plan for left heart cath  Subjective: -Feels better, breathing is improving  Assessment and Plan:  Acute on chronic diastolic CHF Hypertensive urgency -Echo 3/23 noted EF 50-55%, grade 1 DD -Repeat echo noted drop in EF to 25-30% -symptoms primarily related to CHF, doubt pneumonia -Remains on IV Lasix, appears close to euvolemic, switch to oral diuretics tomorrow -Continue Farxiga, continue low-dose Entresto, may monitor kidney function closely -Plan for left heart cath possibly Tuesday, hold Eliquis this weekend  Hypokalemia -Repleted  ?  Multifocal pneumonia -Clinically low suspicion for infectious process, no fever or leukocytosis, procalcitonin is low as well -Started on empiric ABX on admission, repeat chest x-ray indicating bilateral pleural effusions and atelectasis likely  -Changed to oral cefdinir to complete 5-day course  CKD 3b -Stable, monitor with diuresis, baseline creatinine around 1.4-1.7  Paroxysmal A-fib with RVR -Heart rate controlled,  continue Eliquis  Hypothyroidism -Continue Synthroid  Type 2 diabetes mellitus -Continue Farxiga, glipizide on hold  Obesity   DVT prophylaxis: Eliquis Code Status: Full code Family Communication: No need bedside, left message for daughter Disposition Plan: Home after cath next week  Consultants: Cardio   Procedures:    Antimicrobials:    Objective: Vitals:   05/15/22 1124 05/15/22 1939 05/16/22 0423 05/16/22 0839  BP: (!) 151/83 (!) 153/86 (!) 165/115 (!) 160/76  Pulse: 60 78 69 75  Resp: 18 18 18 19   Temp:   98.3 F (36.8 C) 97.9 F (36.6 C)  TempSrc:  Oral Oral Oral  SpO2: 94% 94% 97% 92%  Weight:   101.4 kg   Height:        Intake/Output Summary (Last 24 hours) at 05/16/2022 1228 Last data filed at 05/16/2022 1222 Gross per 24 hour  Intake 714 ml  Output 3450 ml  Net -2736 ml   Filed Weights   05/13/22 2300 05/14/22 0400 05/16/22 0423  Weight: 101.6 kg 101.6 kg 101.4 kg    Examination:  General exam: Obese pleasant male sitting up in bed, AAOx3, no distress HEENT: No JVD CVS: S1-S2, clear rhythm Lungs: Decreased breath sounds at the bases otherwise clear Abdomen: Soft, nontender, bowel sounds present Extremities: Trace edema  Skin: No rashes Psychiatry:  Mood & affect appropriate.     Data Reviewed:   CBC: Recent Labs  Lab 05/13/22 1627 05/13/22 1639 05/14/22 0647 05/15/22 0332 05/16/22 0529  WBC 10.7*  --  10.2 9.1 9.8  NEUTROABS 9.0*  --   --   --   --   HGB 14.4 15.3 12.7* 13.0 13.7  HCT 42.9 45.0 38.2* 38.8* 41.8  MCV 89.6  --  90.3 90.7 91.1  PLT 264  --  230 214 233   Basic Metabolic Panel: Recent Labs  Lab 05/13/22 1627 05/13/22 1639 05/14/22 0647 05/15/22 0332 05/16/22 0529  NA 137 137 136 140 137  K 3.5 3.5 2.8* 4.8 3.4*  CL 105 104 98 103 102  CO2 21*  --  24 24 24   GLUCOSE 176* 198* 175* 132* 119*  BUN 12 15 10 17 21   CREATININE 1.57* 1.40* 1.53* 1.79* 1.72*  CALCIUM 9.2  --  8.4* 8.7* 8.3*  MG 2.0  --  1.9  --   --   PHOS  --   --  2.7  --   --    GFR: Estimated Creatinine Clearance: 40.9 mL/min (A) (by C-G formula based on SCr of 1.72 mg/dL (H)). Liver Function Tests: Recent Labs  Lab 05/13/22 1627 05/14/22 0647  AST 14* 16  ALT 14 16  ALKPHOS 57 48  BILITOT 1.0 1.2  PROT 6.5 5.7*  ALBUMIN 3.5 3.1*   Recent Labs  Lab  05/13/22 1627  LIPASE 24   No results for input(s): "AMMONIA" in the last 168 hours. Coagulation Profile: No results for input(s): "INR", "PROTIME" in the last 168 hours. Cardiac Enzymes: No results for input(s): "CKTOTAL", "CKMB", "CKMBINDEX", "TROPONINI" in the last 168 hours. BNP (last 3 results) No results for input(s): "PROBNP" in the last 8760 hours. HbA1C: Recent Labs    05/14/22 0647  HGBA1C 7.2*   CBG: Recent Labs  Lab 05/15/22 1626 05/15/22 2116 05/15/22 2133 05/16/22 0607 05/16/22 1113  GLUCAP 118* 146* 137* 118* 142*   Lipid Profile: No results for input(s): "CHOL", "HDL", "LDLCALC", "TRIG", "CHOLHDL", "LDLDIRECT" in the last 72 hours. Thyroid Function Tests: No results for input(s): "TSH", "T4TOTAL", "FREET4", "T3FREE", "THYROIDAB" in the last 72 hours. Anemia Panel: No results for input(s): "VITAMINB12", "FOLATE", "FERRITIN", "TIBC", "IRON", "RETICCTPCT" in the last 72 hours. Urine analysis:    Component Value Date/Time   COLORURINE YELLOW 01/01/2021 1351   APPEARANCEUR CLEAR 01/01/2021 1351   LABSPEC 1.009 01/01/2021 1351   PHURINE 6.0 01/01/2021 1351   GLUCOSEU >=500 (A) 01/01/2021 1351   HGBUR SMALL (A) 01/01/2021 1351   BILIRUBINUR NEGATIVE 01/01/2021 1351   KETONESUR NEGATIVE 01/01/2021 1351   PROTEINUR >=300 (A) 01/01/2021 1351   UROBILINOGEN 1.0 05/31/2013 1432   NITRITE NEGATIVE 01/01/2021 1351   LEUKOCYTESUR NEGATIVE 01/01/2021 1351   Sepsis Labs: @LABRCNTIP (procalcitonin:4,lacticidven:4)  ) Recent Results (from the past 240 hour(s))  Resp Panel by RT-PCR (Flu A&B, Covid) Anterior Nasal Swab     Status: None   Collection Time: 05/13/22  2:26 PM   Specimen: Anterior Nasal Swab  Result Value Ref Range Status   SARS Coronavirus 2 by RT PCR NEGATIVE NEGATIVE Final    Comment: (NOTE) SARS-CoV-2 target nucleic acids are NOT DETECTED.  The SARS-CoV-2 RNA is generally detectable in upper respiratory specimens during the acute phase of  infection. The lowest concentration of SARS-CoV-2 viral copies this assay can detect is 138 copies/mL. A negative result does not preclude SARS-Cov-2 infection and should not be used as the sole basis for treatment or other patient management decisions. A negative result may occur with  improper specimen collection/handling, submission of specimen other than nasopharyngeal swab, presence of viral mutation(s) within the areas targeted by this assay, and inadequate number of viral copies(<138 copies/mL). A negative result must be combined with clinical observations, patient history, and epidemiological information. The expected result is Negative.  Fact Sheet for Patients:  EntrepreneurPulse.com.au  Fact Sheet for Healthcare Providers:  IncredibleEmployment.be  This test is no t yet approved or cleared by the [place] FDA and  has been authorized for detection and/or diagnosis of SARS-CoV-2 by FDA under an Emergency Use Authorization (EUA). This EUA will remain  in effect (meaning this test can be used) for the  duration of the COVID-19 declaration under Section 564(b)(1) of the Act, 21 U.S.C.section 360bbb-3(b)(1), unless the authorization is terminated  or revoked sooner.       Influenza A by PCR NEGATIVE NEGATIVE Final   Influenza B by PCR NEGATIVE NEGATIVE Final    Comment: (NOTE) The Xpert Xpress SARS-CoV-2/FLU/RSV plus assay is intended as an aid in the diagnosis of influenza from Nasopharyngeal swab specimens and should not be used as a sole basis for treatment. Nasal washings and aspirates are unacceptable for Xpert Xpress SARS-CoV-2/FLU/RSV testing.  Fact Sheet for Patients: EntrepreneurPulse.com.au  Fact Sheet for Healthcare Providers: IncredibleEmployment.be  This test is not yet approved or cleared by the [place] FDA and has been authorized for detection and/or diagnosis of SARS-CoV-2  by FDA under an Emergency Use Authorization (EUA). This EUA will remain in effect (meaning this test can be used) for the duration of the COVID-19 declaration under Section 564(b)(1) of the Act, 21 U.S.C. section 360bbb-3(b)(1), unless the authorization is terminated or revoked.  Performed at [facility], [place] [address], [place], [place] [address]   Blood culture (routine x 2)     Status: None (Preliminary result)   Collection Time: 05/13/22  4:27 PM   Specimen: BLOOD  Result Value Ref Range Status   Specimen Description BLOOD RIGHT ANTECUBITAL  Final   Special Requests   Final    BOTTLES DRAWN AEROBIC AND ANAEROBIC Blood Culture results may not be optimal due to an excessive volume of blood received in culture bottles   Culture   Final    NO GROWTH 3 DAYS Performed at [facility], [place] [address] [place], [place] [address]    Report Status PENDING  Incomplete  Blood culture (routine x 2)     Status: None (Preliminary result)   Collection Time: 05/13/22  4:27 PM   Specimen: BLOOD LEFT HAND  Result Value Ref Range Status   Specimen Description BLOOD LEFT HAND  Final   Special Requests   Final    BOTTLES DRAWN AEROBIC AND ANAEROBIC Blood Culture adequate volume   Culture   Final    NO GROWTH 3 DAYS Performed at [facility], [place] [address], [place], [place] [address]    Report Status PENDING  Incomplete     Radiology Studies: DG Chest 2 View  Result Date: 05/14/2022 CLINICAL DATA:  Shortness of breath EXAM: CHEST - 2 VIEW COMPARISON:  Previous studies including the examination of 05/13/2022 FINDINGS: Transverse diameter of heart is slightly increased. Central pulmonary vessels are prominent without signs of alveolar pulmonary edema. There is increased density in both lower lung fields. There is slight improvement in this finding. There are no new focal infiltrates. There is no pneumothorax. There is blunting of both posterior costophrenic angles. Pain control  lead is seen in thoracic spinal canal. IMPRESSION: Increased density in both lower lung fields may be due to small bilateral effusions and possibly underlying atelectasis/pneumonia. There are no signs of alveolar pulmonary edema or new focal infiltrates. Electronically Signed   By: Elmer Picker M.D.   On: 05/14/2022 15:23     Scheduled Meds:  apixaban  2.5 mg Oral BID   cefdinir  300 mg Oral Q12H   clopidogrel  75 mg Oral Daily   dapagliflozin propanediol  10 mg Oral Daily   dextromethorphan-guaiFENesin  1 tablet Oral BID   furosemide  40 mg Intravenous Q12H   hydrALAZINE  50 mg Oral TID   insulin  aspart  0-5 Units Subcutaneous QHS   insulin aspart  0-9 Units Subcutaneous TID WC   [START ON 05/17/2022] isosorbide mononitrate  60 mg Oral Daily   levothyroxine  25 mcg Oral Q0600   metoprolol tartrate  12.5 mg Oral BID   pantoprazole  40 mg Oral QODAY   potassium chloride  40 mEq Oral BID   sacubitril-valsartan  1 tablet Oral BID   Continuous Infusions:     LOS: 3 days    Time spent:  Zannie Cove, MD [facility]   05/16/2022, 12:28 PM

## 2022-05-16 NOTE — Progress Notes (Signed)
Progress Note   Patient Name: Albert Powell Date of Encounter: 05/16/2022  Primary Cardiologist: Nanetta Batty, MD   Subjective   Pt was seen this AM bedside. States he is doing well, and denies any dyspnea at rest or exertion. Able to tolerate diet well, and has been urinating frequently due to lasix. Discussed plan today for cath on Tuesday instead of Monday due to holiday, pt was agreeable.   Inpatient Medications    Scheduled Meds:  apixaban  2.5 mg Oral BID   cefdinir  300 mg Oral Q12H   clopidogrel  75 mg Oral Daily   dapagliflozin propanediol  10 mg Oral Daily   dextromethorphan-guaiFENesin  1 tablet Oral BID   furosemide  40 mg Intravenous Q12H   hydrALAZINE  50 mg Oral TID   insulin aspart  0-5 Units Subcutaneous QHS   insulin aspart  0-9 Units Subcutaneous TID WC   [START ON 05/17/2022] isosorbide mononitrate  60 mg Oral Daily   levothyroxine  25 mcg Oral Q0600   metoprolol tartrate  12.5 mg Oral BID   pantoprazole  40 mg Oral QODAY   potassium chloride  40 mEq Oral BID   sacubitril-valsartan  1 tablet Oral BID   Continuous Infusions:  PRN Meds: acetaminophen **OR** acetaminophen, guaiFENesin-dextromethorphan, hydrALAZINE   Vital Signs    Vitals:   05/15/22 1124 05/15/22 1939 05/16/22 0423 05/16/22 0839  BP: (!) 151/83 (!) 153/86 (!) 165/115 (!) 160/76  Pulse: 60 78 69 75  Resp: 18 18 18 19   Temp:   98.3 F (36.8 C) 97.9 F (36.6 C)  TempSrc:  Oral Oral Oral  SpO2: 94% 94% 97% 92%  Weight:   101.4 kg   Height:        Intake/Output Summary (Last 24 hours) at 05/16/2022 1116 Last data filed at 05/16/2022 1000 Gross per 24 hour  Intake 714 ml  Output 3800 ml  Net -3086 ml      05/16/2022    4:23 AM 05/14/2022    4:00 AM 05/13/2022   11:00 PM  Last 3 Weights  Weight (lbs) 223 lb 8.7 oz 223 lb 15.8 oz 223 lb 15.8 oz  Weight (kg) 101.4 kg 101.6 kg 101.6 kg      Telemetry    Atrial fibrillation - Personally Reviewed  ECG    Atrial  fibrillation - Personally Reviewed  Physical Exam   Constitutional: Obese gentleman in no acute distress  Cardiovascular: Irregular irregular rate and rhythm Abdominal: Slightly less distension  Pulmonary: Lungs clear to auscultation bilaterally  Extremities: No lower extremity edema appreciated  Labs    Chemistry Recent Labs  Lab 05/13/22 1627 05/13/22 1639 05/14/22 0647 05/15/22 0332 05/16/22 0529  NA 137   < > 136 140 137  K 3.5   < > 2.8* 4.8 3.4*  CL 105   < > 98 103 102  CO2 21*  --  24 24 24   GLUCOSE 176*   < > 175* 132* 119*  BUN 12   < > 10 17 21   CREATININE 1.57*   < > 1.53* 1.79* 1.72*  CALCIUM 9.2  --  8.4* 8.7* 8.3*  PROT 6.5  --  5.7*  --   --   ALBUMIN 3.5  --  3.1*  --   --   AST 14*  --  16  --   --   ALT 14  --  16  --   --   ALKPHOS 57  --  48  --   --   BILITOT 1.0  --  1.2  --   --   GFRNONAA 44*  --  46* 38* 40*  ANIONGAP 11  --  14 13 11    < > = values in this interval not displayed.     Hematology Recent Labs  Lab 05/14/22 0647 05/15/22 0332 05/16/22 0529  WBC 10.2 9.1 9.8  RBC 4.23 4.28 4.59  HGB 12.7* 13.0 13.7  HCT 38.2* 38.8* 41.8  MCV 90.3 90.7 91.1  MCH 30.0 30.4 29.8  MCHC 33.2 33.5 32.8  RDW 13.7 14.0 14.0  PLT 230 214 233    Cardiac EnzymesNo results for input(s): "TROPONINI" in the last 168 hours. No results for input(s): "TROPIPOC" in the last 168 hours.   BNP Recent Labs  Lab 05/13/22 1627  BNP >4,500.0*     DDimer No results for input(s): "DDIMER" in the last 168 hours.   Radiology    DG Chest 2 View  Result Date: 05/14/2022 CLINICAL DATA:  Shortness of breath EXAM: CHEST - 2 VIEW COMPARISON:  Previous studies including the examination of 05/13/2022 FINDINGS: Transverse diameter of heart is slightly increased. Central pulmonary vessels are prominent without signs of alveolar pulmonary edema. There is increased density in both lower lung fields. There is slight improvement in this finding. There are no new  focal infiltrates. There is no pneumothorax. There is blunting of both posterior costophrenic angles. Pain control lead is seen in thoracic spinal canal. IMPRESSION: Increased density in both lower lung fields may be due to small bilateral effusions and possibly underlying atelectasis/pneumonia. There are no signs of alveolar pulmonary edema or new focal infiltrates. Electronically Signed   By: 05/15/2022 M.D.   On: 05/14/2022 15:23   ECHOCARDIOGRAM LIMITED  Result Date: 05/14/2022    ECHOCARDIOGRAM LIMITED REPORT   Patient Name:   Albert Powell Date of Exam: 05/14/2022 Medical Rec #:  05/16/2022        Height:       70.0 in Accession #:    [id number]       Weight:       224.0 lb Date of Birth:  1941-01-28        BSA:          2.190 m Patient Age:    81 years         BP:           161/99 mmHg Patient Gender: M                HR:           102 bpm. Exam Location:  Inpatient Procedure: Limited Echo Indications:    CHF Acute Systolic I50.21  History:        Patient has prior history of Echocardiogram examinations, most                 recent 11/25/2021. CHF, CAD and Angina, PAD,                 Arrythmias:Bradycardia and Atrial Fibrillation; Risk                 Factors:Hypertension, Diabetes and Dyslipidemia. Elevated                 Troponin.  Sonographer:    11/27/2021 Referring Phys: Lucendia Herrlich OLADAPO ADEFESO IMPRESSIONS  1. Left ventricular ejection fraction, by estimation, is 25 to 30%. The left ventricle has severely decreased function.  The left ventricle demonstrates regional wall motion abnormalities (see scoring diagram/findings for description).  2. Right ventricular systolic function is normal. The right ventricular size is normal. Tricuspid regurgitation signal is inadequate for assessing PA pressure.  3. Trivial mitral valve regurgitation. No evidence of mitral stenosis.  4. The aortic valve is tricuspid. There is mild calcification of the aortic valve. There is mild thickening of the  aortic valve. Aortic valve sclerosis/calcification is present, without any evidence of aortic stenosis.  5. The inferior vena cava is dilated in size with <50% respiratory variability, suggesting right atrial pressure of 15 mmHg. Comparison(s): Prior images reviewed side by side. The left ventricular function is significantly worse. The left ventricular wall motion abnormalities are significantly worse. FINDINGS  Left Ventricle: Left ventricular ejection fraction, by estimation, is 25 to 30%. The left ventricle has severely decreased function. The left ventricle demonstrates regional wall motion abnormalities. Severe hypokinesis of the left ventricular, basal-mid inferoseptal wall, inferior wall and anterolateral wall. Mild hypokinesis of the left ventricular, entire anteroseptal wall, anterior wall and apical segment. The left ventricular internal cavity size was normal in size. There is no left ventricular hypertrophy. Right Ventricle: The right ventricular size is normal. No increase in right ventricular wall thickness. Right ventricular systolic function is normal. Tricuspid regurgitation signal is inadequate for assessing PA pressure. Right Atrium: Right atrial size was normal in size. Pericardium: Trivial pericardial effusion is present. Mitral Valve: Mild to moderate mitral annular calcification. Trivial mitral valve regurgitation. No evidence of mitral valve stenosis. Tricuspid Valve: The tricuspid valve is normal in structure. Tricuspid valve regurgitation is not demonstrated. Aortic Valve: The aortic valve is tricuspid. There is mild calcification of the aortic valve. There is mild thickening of the aortic valve. Aortic valve sclerosis/calcification is present, without any evidence of aortic stenosis. Pulmonic Valve: The pulmonic valve was normal in structure. Pulmonic valve regurgitation is not visualized. Aorta: The aortic root is normal in size and structure. Venous: The inferior vena cava is dilated in  size with less than 50% respiratory variability, suggesting right atrial pressure of 15 mmHg. IAS/Shunts: No atrial level shunt detected by color flow Doppler. Thurmon Fair MD Electronically signed by Thurmon Fair MD Signature Date/Time: 05/14/2022/12:52:08 PM    Final     Cardiac Studies    Patient Profile     81 y.o. male with PMH of CAD, PAD, chronic diastolic heart failure, paroxysmal atrial fibrillation, and hypertension who presented with several days of shortness of breath.    Assessment & Plan    Acute on Chronic Systolic and Diastolic Heart Failure  CAD Pt's last echo revealed an EF of 25-30% withLV having severely decreased function with regional wall motion abnormalities. He is still on 2L of O2. He has been able to ambulate well with PT and mobility specialists. Pt still appears volume up in his abdomen. Discussed necessity for cath today due to reduced EF again and patient states he is agreeable and understands that it may not be done until Tuesday due to holiday.   Plan:  - Continue diuresis with IV Lasix  - Entresto for afterload reduction - Monitor BMP for renal function  - Strict I/O with fluid restriction diet - Daily weights  - Continue to farxiga, and metoprolol (change to succinate prior to discharge)  - Low dose imdur to help with elevated Blood pressure  - Continue Clopidogrel  - Will discuss need for statin   Atrial Fibrillation, persistent vs paroxysmal CHADVASC score is 6, and  patient is currently rate controlled with metoprolol. Continue eliquis for now, and will d/c before cath procedure on Tuesday      For questions or updates, please contact [facility] Please consult [email or web address] for contact info under        Signed, Olegario Messier, MD  05/16/2022, 11:16 AM

## 2022-05-16 NOTE — Progress Notes (Signed)
   Pt on board for cath on Tuesday, 09/05, orders written.  Theodore Demark, PA-C 05/16/2022 7:44 PM

## 2022-05-17 DIAGNOSIS — I10 Essential (primary) hypertension: Secondary | ICD-10-CM | POA: Diagnosis not present

## 2022-05-17 DIAGNOSIS — I2510 Atherosclerotic heart disease of native coronary artery without angina pectoris: Secondary | ICD-10-CM | POA: Diagnosis not present

## 2022-05-17 DIAGNOSIS — I5041 Acute combined systolic (congestive) and diastolic (congestive) heart failure: Secondary | ICD-10-CM | POA: Diagnosis not present

## 2022-05-17 DIAGNOSIS — I4811 Longstanding persistent atrial fibrillation: Secondary | ICD-10-CM | POA: Diagnosis not present

## 2022-05-17 LAB — GLUCOSE, CAPILLARY
Glucose-Capillary: 133 mg/dL — ABNORMAL HIGH (ref 70–99)
Glucose-Capillary: 129 mg/dL — ABNORMAL HIGH (ref 70–99)
Glucose-Capillary: 153 mg/dL — ABNORMAL HIGH (ref 70–99)
Glucose-Capillary: 180 mg/dL — ABNORMAL HIGH (ref 70–99)

## 2022-05-17 LAB — BASIC METABOLIC PANEL
Anion gap: 10 (ref 5–15)
BUN: 24 mg/dL — ABNORMAL HIGH (ref 8–23)
CO2: 29 mmol/L (ref 22–32)
Calcium: 8.6 mg/dL — ABNORMAL LOW (ref 8.9–10.3)
Chloride: 100 mmol/L (ref 98–111)
Creatinine, Ser: 1.71 mg/dL — ABNORMAL HIGH (ref 0.61–1.24)
GFR, Estimated: 40 mL/min — ABNORMAL LOW (ref >60)
Glucose, Bld: 127 mg/dL — ABNORMAL HIGH (ref 70–99)
Potassium: 3.5 mmol/L (ref 3.5–5.1)
Sodium: 139 mmol/L (ref 135–145)

## 2022-05-17 MED ORDER — FUROSEMIDE 10 MG/ML IJ SOLN
20.0000 mg | Freq: Two times a day (BID) | INTRAMUSCULAR | Status: AC
  Administered 2022-05-17: 20 mg via INTRAVENOUS
  Filled 2022-05-17: qty 2

## 2022-05-17 NOTE — Progress Notes (Signed)
PROGRESS NOTE    Albert Powell  [id number] DOB: [birth year] DOA: 05/13/2022 PCP: Leeroy Cha, MD  81/M with history of CAD, chronic diastolic CHF, COPD, type 2 diabetes mellitus, atrial fibrillation, hypertension presented to the ED with shortness of breath for 2 to 3 days, hypoxic in the ER, noted to be in A-fib RVR, systolic blood pressure in the 240 range.  Work-up noted creatinine of 1.57, troponin of 96, BNP 4500, chest x-ray noted bibasilar infiltrates, possibly multifocal pneumonia. -Improving with diuretics, echo noted drop in EF to 25-30% -Cards consulting, plan for left heart cath on Tuesday 9/5  Subjective: -Feels better, breathing is improving, back to baseline, denies any chest pain  Assessment and Plan:  Acute on chronic systolic and diastolic CHF Hypertensive urgency -Echo 3/23 noted EF 50-55%, grade 1 DD -Repeat echo noted drop in EF to 25-30% -symptoms primarily related to CHF, doubt pneumonia -Diuresed well on IV Lasix he is 4.7 L negative, appears close to euvolemic, decrease Lasix today, changed to p.o. tomorrow -Continue Farxiga, continue low-dose Entresto, monitor kidney function closely -Plan for left heart cath on Tuesday, hold Eliquis on Sunday  Hypokalemia -Repleted  ?  Multifocal pneumonia -Clinically low suspicion for infectious process, no fever or leukocytosis, procalcitonin is low as well -Started on empiric ABX on admission, repeat chest x-ray indicating bilateral pleural effusions and atelectasis -Completed 5 days of antibiotics  CKD 3b -Stable, monitor with diuresis, baseline creatinine around 1.4-1.7  Paroxysmal A-fib with RVR -Heart rate controlled,  continue Eliquis  Hypothyroidism -Continue Synthroid  Type 2 diabetes mellitus -Continue Farxiga, glipizide on hold  Obesity   DVT prophylaxis: Eliquis Code Status: Full code Family Communication: No need bedside, left message for daughter Disposition Plan: Home after  cath next week  Consultants: Cards   Procedures:   Antimicrobials:    Objective: Vitals:   05/16/22 1948 05/17/22 0321 05/17/22 0827 05/17/22 0900  BP: (!) 181/95 (!) 149/83 (!) 182/66 (!) 166/71  Pulse: 84 62  66  Resp: 20 20  18   Temp: 97.9 F (36.6 C) 97.8 F (36.6 C)  97.6 F (36.4 C)  TempSrc: Oral Oral  Oral  SpO2: 99% 93% 93% 98%  Weight:  100.8 kg    Height:        Intake/Output Summary (Last 24 hours) at 05/17/2022 1120 Last data filed at 05/17/2022 0900 Gross per 24 hour  Intake 607 ml  Output 2170 ml  Net -1563 ml   Filed Weights   05/14/22 0400 05/16/22 0423 05/17/22 0321  Weight: 101.6 kg 101.4 kg 100.8 kg    Examination:  General exam: Obese elderly male sitting up in bed, AAOx3, no distress HEENT: No JVD CVS: S1-S2, regular rhythm Lungs: Decreased breath sounds at the bases, otherwise clear Abdomen: Soft, nontender, bowel sounds present Extremities: No edema Skin: No rashes Psychiatry:  Mood & affect appropriate.     Data Reviewed:   CBC: Recent Labs  Lab 05/13/22 1627 05/13/22 1639 05/14/22 0647 05/15/22 0332 05/16/22 0529  WBC 10.7*  --  10.2 9.1 9.8  NEUTROABS 9.0*  --   --   --   --   HGB 14.4 15.3 12.7* 13.0 13.7  HCT 42.9 45.0 38.2* 38.8* 41.8  MCV 89.6  --  90.3 90.7 91.1  PLT 264  --  230 214 0000000   Basic Metabolic Panel: Recent Labs  Lab 05/13/22 1627 05/13/22 1639 05/14/22 0647 05/15/22 0332 05/16/22 0529 05/17/22 0259  NA 137 137 136 140 137  139  K 3.5 3.5 2.8* 4.8 3.4* 3.5  CL 105 104 98 103 102 100  CO2 21*  --  24 24 24 29   GLUCOSE 176* 198* 175* 132* 119* 127*  BUN 12 15 10 17 21  24*  CREATININE 1.57* 1.40* 1.53* 1.79* 1.72* 1.71*  CALCIUM 9.2  --  8.4* 8.7* 8.3* 8.6*  MG 2.0  --  1.9  --   --   --   PHOS  --   --  2.7  --   --   --    GFR: Estimated Creatinine Clearance: 41 mL/min (A) (by C-G formula based on SCr of 1.71 mg/dL (H)). Liver Function Tests: Recent Labs  Lab 05/13/22 1627 05/14/22 0647   AST 14* 16  ALT 14 16  ALKPHOS 57 48  BILITOT 1.0 1.2  PROT 6.5 5.7*  ALBUMIN 3.5 3.1*   Recent Labs  Lab 05/13/22 1627  LIPASE 24   No results for input(s): "AMMONIA" in the last 168 hours. Coagulation Profile: No results for input(s): "INR", "PROTIME" in the last 168 hours. Cardiac Enzymes: No results for input(s): "CKTOTAL", "CKMB", "CKMBINDEX", "TROPONINI" in the last 168 hours. BNP (last 3 results) No results for input(s): "PROBNP" in the last 8760 hours. HbA1C: No results for input(s): "HGBA1C" in the last 72 hours.  CBG: Recent Labs  Lab 05/16/22 0607 05/16/22 1113 05/16/22 1606 05/16/22 2058 05/17/22 0600  GLUCAP 118* 142* 142* 183* 133*   Lipid Profile: Recent Labs    05/16/22 0529  CHOL 136  HDL 43  LDLCALC 70  TRIG 115  CHOLHDL 3.2   Thyroid Function Tests: No results for input(s): "TSH", "T4TOTAL", "FREET4", "T3FREE", "THYROIDAB" in the last 72 hours. Anemia Panel: No results for input(s): "VITAMINB12", "FOLATE", "FERRITIN", "TIBC", "IRON", "RETICCTPCT" in the last 72 hours. Urine analysis:    Component Value Date/Time   COLORURINE YELLOW 01/01/2021 1351   APPEARANCEUR CLEAR 01/01/2021 1351   LABSPEC 1.009 01/01/2021 1351   PHURINE 6.0 01/01/2021 1351   GLUCOSEU >=500 (A) 01/01/2021 1351   HGBUR SMALL (A) 01/01/2021 1351   BILIRUBINUR NEGATIVE 01/01/2021 1351   KETONESUR NEGATIVE 01/01/2021 1351   PROTEINUR >=300 (A) 01/01/2021 1351   UROBILINOGEN 1.0 05/31/2013 1432   NITRITE NEGATIVE 01/01/2021 1351   LEUKOCYTESUR NEGATIVE 01/01/2021 1351   Sepsis Labs: @LABRCNTIP (procalcitonin:4,lacticidven:4)  ) Recent Results (from the past 240 hour(s))  Resp Panel by RT-PCR (Flu A&B, Covid) Anterior Nasal Swab     Status: None   Collection Time: 05/13/22  2:26 PM   Specimen: Anterior Nasal Swab  Result Value Ref Range Status   SARS Coronavirus 2 by RT PCR NEGATIVE NEGATIVE Final    Comment: (NOTE) SARS-CoV-2 target nucleic acids are NOT  DETECTED.  The SARS-CoV-2 RNA is generally detectable in upper respiratory specimens during the acute phase of infection. The lowest concentration of SARS-CoV-2 viral copies this assay can detect is 138 copies/mL. A negative result does not preclude SARS-Cov-2 infection and should not be used as the sole basis for treatment or other patient management decisions. A negative result may occur with  improper specimen collection/handling, submission of specimen other than nasopharyngeal swab, presence of viral mutation(s) within the areas targeted by this assay, and inadequate number of viral copies(<138 copies/mL). A negative result must be combined with clinical observations, patient history, and epidemiological information. The expected result is Negative.  Fact Sheet for Patients:  01/03/2021  Fact Sheet for Healthcare Providers:   This test is no t yet  approved or cleared by the [place] and  has been authorized for detection and/or diagnosis of SARS-CoV-2 by FDA under an Emergency Use Authorization (EUA). This EUA will remain  in effect (meaning this test can be used) for the duration of the COVID-19 declaration under Section 564(b)(1) of the Act, 21 U.S.C.section 360bbb-3(b)(1), unless the authorization is terminated  or revoked sooner.       Influenza A by PCR NEGATIVE NEGATIVE Final   Influenza B by PCR NEGATIVE NEGATIVE Final    Comment: (NOTE) The Xpert Xpress SARS-CoV-2/FLU/RSV plus assay is intended as an aid in the diagnosis of influenza from Nasopharyngeal swab specimens and should not be used as a sole basis for treatment. Nasal washings and aspirates are unacceptable for Xpert Xpress SARS-CoV-2/FLU/RSV testing.  Fact Sheet for Patients: BloggerCourse.com  Fact Sheet for Healthcare Providers: SeriousBroker.it  This test is not yet  approved or cleared by the [place] FDA and has been authorized for detection and/or diagnosis of SARS-CoV-2 by FDA under an Emergency Use Authorization (EUA). This EUA will remain in effect (meaning this test can be used) for the duration of the COVID-19 declaration under Section 564(b)(1) of the Act, 21 U.S.C. section 360bbb-3(b)(1), unless the authorization is terminated or revoked.  Performed at [facility], [address] [place], [place] [address]   Blood culture (routine x 2)     Status: None (Preliminary result)   Collection Time: 05/13/22  4:27 PM   Specimen: BLOOD  Result Value Ref Range Status   Specimen Description BLOOD RIGHT ANTECUBITAL  Final   Special Requests   Final    BOTTLES DRAWN AEROBIC AND ANAEROBIC Blood Culture results may not be optimal due to an excessive volume of blood received in culture bottles   Culture   Final    NO GROWTH 4 DAYS Performed at [facility], [address], [place], [place] [address]    Report Status PENDING  Incomplete  Blood culture (routine x 2)     Status: None (Preliminary result)   Collection Time: 05/13/22  4:27 PM   Specimen: BLOOD LEFT HAND  Result Value Ref Range Status   Specimen Description BLOOD LEFT HAND  Final   Special Requests   Final    BOTTLES DRAWN AEROBIC AND ANAEROBIC Blood Culture adequate volume   Culture   Final    NO GROWTH 4 DAYS Performed at [facility], [address] [place], [place] [address]    Report Status PENDING  Incomplete     Radiology Studies: No results found.   Scheduled Meds:  apixaban  2.5 mg Oral BID   atorvastatin  40 mg Oral Daily   clopidogrel  75 mg Oral Daily   dapagliflozin propanediol  10 mg Oral Daily   dextromethorphan-guaiFENesin  1 tablet Oral BID   furosemide  40 mg Intravenous Q12H   hydrALAZINE  50 mg Oral TID   insulin aspart  0-5 Units Subcutaneous QHS   insulin aspart  0-9 Units Subcutaneous TID WC   isosorbide mononitrate  60 mg  Oral Daily   levothyroxine  25 mcg Oral Q0600   metoprolol tartrate  12.5 mg Oral BID   pantoprazole  40 mg Oral QODAY   sacubitril-valsartan  1 tablet Oral BID   Continuous Infusions:     LOS: 4 days    Time spent:  Zannie Cove, MD [facility]   05/17/2022, 11:20 AM

## 2022-05-17 NOTE — Progress Notes (Signed)
Rounding Note    Patient Name: Albert Powell Date of Encounter: 05/17/2022  [place] Cardiologist: Quay Burow, MD   Subjective   Feels well, no cp, mild sob and wheezing.  Inpatient Medications    Scheduled Meds:  apixaban  2.5 mg Oral BID   atorvastatin  40 mg Oral Daily   clopidogrel  75 mg Oral Daily   dapagliflozin propanediol  10 mg Oral Daily   dextromethorphan-guaiFENesin  1 tablet Oral BID   furosemide  20 mg Intravenous Q12H   hydrALAZINE  50 mg Oral TID   insulin aspart  0-5 Units Subcutaneous QHS   insulin aspart  0-9 Units Subcutaneous TID WC   isosorbide mononitrate  60 mg Oral Daily   levothyroxine  25 mcg Oral Q0600   metoprolol tartrate  12.5 mg Oral BID   pantoprazole  40 mg Oral QODAY   sacubitril-valsartan  1 tablet Oral BID   Continuous Infusions:  PRN Meds: acetaminophen **OR** acetaminophen, guaiFENesin-dextromethorphan, hydrALAZINE   Vital Signs    Vitals:   05/16/22 1948 05/17/22 0321 05/17/22 0827 05/17/22 0900  BP: (!) 181/95 (!) 149/83 (!) 182/66 (!) 166/71  Pulse: 84 62  66  Resp: 20 20  18   Temp: 97.9 F (36.6 C) 97.8 F (36.6 C)  97.6 F (36.4 C)  TempSrc: Oral Oral  Oral  SpO2: 99% 93% 93% 98%  Weight:  100.8 kg    Height:        Intake/Output Summary (Last 24 hours) at 05/17/2022 1155 Last data filed at 05/17/2022 1100 Gross per 24 hour  Intake 607 ml  Output 2870 ml  Net -2263 ml      05/17/2022    3:21 AM 05/16/2022    4:23 AM 05/14/2022    4:00 AM  Last 3 Weights  Weight (lbs) 222 lb 3.2 oz 223 lb 8.7 oz 223 lb 15.8 oz  Weight (kg) 100.789 kg 101.4 kg 101.6 kg      Telemetry    Appears to have converted to SR - Personally Reviewed  ECG    pending - Personally Reviewed  Physical Exam   GEN: No acute distress.   Neck: No JVD Cardiac: RRR, no murmurs, rubs, or gallops.  Respiratory: end expiratory wheeze GI: Soft, nontender, non-distended  MS: No edema; No deformity. Neuro:  Nonfocal   Psych: Normal affect   Labs    High Sensitivity Troponin:   Recent Labs  Lab 05/13/22 1627 05/13/22 2131  TROPONINIHS 96* 85*     Chemistry Recent Labs  Lab 05/13/22 1627 05/13/22 1639 05/14/22 0647 05/15/22 0332 05/16/22 0529 05/17/22 0259  NA 137   < > 136 140 137 139  K 3.5   < > 2.8* 4.8 3.4* 3.5  CL 105   < > 98 103 102 100  CO2 21*  --  24 24 24 29   GLUCOSE 176*   < > 175* 132* 119* 127*  BUN 12   < > 10 17 21  24*  CREATININE 1.57*   < > 1.53* 1.79* 1.72* 1.71*  CALCIUM 9.2  --  8.4* 8.7* 8.3* 8.6*  MG 2.0  --  1.9  --   --   --   PROT 6.5  --  5.7*  --   --   --   ALBUMIN 3.5  --  3.1*  --   --   --   AST 14*  --  16  --   --   --  ALT 14  --  16  --   --   --   ALKPHOS 57  --  48  --   --   --   BILITOT 1.0  --  1.2  --   --   --   GFRNONAA 44*  --  46* 38* 40* 40*  ANIONGAP 11  --  14 13 11 10    < > = values in this interval not displayed.    Lipids  Recent Labs  Lab 05/16/22 0529  CHOL 136  TRIG 115  HDL 43  LDLCALC 70  CHOLHDL 3.2    Hematology Recent Labs  Lab 05/14/22 0647 05/15/22 0332 05/16/22 0529  WBC 10.2 9.1 9.8  RBC 4.23 4.28 4.59  HGB 12.7* 13.0 13.7  HCT 38.2* 38.8* 41.8  MCV 90.3 90.7 91.1  MCH 30.0 30.4 29.8  MCHC 33.2 33.5 32.8  RDW 13.7 14.0 14.0  PLT 230 214 233   Thyroid No results for input(s): "TSH", "FREET4" in the last 168 hours.  BNP Recent Labs  Lab 05/13/22 1627  BNP >4,500.0*    DDimer No results for input(s): "DDIMER" in the last 168 hours.   Radiology    DG Chest 2 View  Result Date: 05/14/2022 CLINICAL DATA:  Shortness of breath EXAM: CHEST - 2 VIEW COMPARISON:  Previous studies including the examination of 05/13/2022 FINDINGS: Transverse diameter of heart is slightly increased. Central pulmonary vessels are prominent without signs of alveolar pulmonary edema. There is increased density in both lower lung fields. There is slight improvement in this finding. There are no new focal infiltrates. There  is no pneumothorax. There is blunting of both posterior costophrenic angles. Pain control lead is seen in thoracic spinal canal. IMPRESSION: Increased density in both lower lung fields may be due to small bilateral effusions and possibly underlying atelectasis/pneumonia. There are no signs of alveolar pulmonary edema or new focal infiltrates. Electronically Signed   By: Elmer Picker M.D.   On: 05/14/2022 15:23   ECHOCARDIOGRAM LIMITED  Result Date: 05/14/2022    ECHOCARDIOGRAM LIMITED REPORT   Patient Name:   Albert Powell Date of Exam: 05/14/2022 Medical Rec #:  [id number]        Height:       70.0 in Accession #:    [id number]       Weight:       224.0 lb Date of Birth:  22-Sep-1940        BSA:          2.190 m Patient Age:    36 years         BP:           161/99 mmHg Patient Gender: M                HR:           102 bpm. Exam Location:  Inpatient Procedure: Limited Echo Indications:    CHF Acute Systolic [id number]  History:        Patient has prior history of Echocardiogram examinations, most                 recent 11/25/2021. CHF, CAD and Angina, PAD,                 Arrythmias:Bradycardia and Atrial Fibrillation; Risk                 Factors:Hypertension, Diabetes and Dyslipidemia. Elevated  Troponin.  Sonographer:    Lucendia Herrlich Referring Phys: [id number] OLADAPO ADEFESO IMPRESSIONS  1. Left ventricular ejection fraction, by estimation, is 25 to 30%. The left ventricle has severely decreased function. The left ventricle demonstrates regional wall motion abnormalities (see scoring diagram/findings for description).  2. Right ventricular systolic function is normal. The right ventricular size is normal. Tricuspid regurgitation signal is inadequate for assessing PA pressure.  3. Trivial mitral valve regurgitation. No evidence of mitral stenosis.  4. The aortic valve is tricuspid. There is mild calcification of the aortic valve. There is mild thickening of the aortic valve. Aortic valve  sclerosis/calcification is present, without any evidence of aortic stenosis.  5. The inferior vena cava is dilated in size with <50% respiratory variability, suggesting right atrial pressure of 15 mmHg. Comparison(s): Prior images reviewed side by side. The left ventricular function is significantly worse. The left ventricular wall motion abnormalities are significantly worse. FINDINGS  Left Ventricle: Left ventricular ejection fraction, by estimation, is 25 to 30%. The left ventricle has severely decreased function. The left ventricle demonstrates regional wall motion abnormalities. Severe hypokinesis of the left ventricular, basal-mid inferoseptal wall, inferior wall and anterolateral wall. Mild hypokinesis of the left ventricular, entire anteroseptal wall, anterior wall and apical segment. The left ventricular internal cavity size was normal in size. There is no left ventricular hypertrophy. Right Ventricle: The right ventricular size is normal. No increase in right ventricular wall thickness. Right ventricular systolic function is normal. Tricuspid regurgitation signal is inadequate for assessing PA pressure. Right Atrium: Right atrial size was normal in size. Pericardium: Trivial pericardial effusion is present. Mitral Valve: Mild to moderate mitral annular calcification. Trivial mitral valve regurgitation. No evidence of mitral valve stenosis. Tricuspid Valve: The tricuspid valve is normal in structure. Tricuspid valve regurgitation is not demonstrated. Aortic Valve: The aortic valve is tricuspid. There is mild calcification of the aortic valve. There is mild thickening of the aortic valve. Aortic valve sclerosis/calcification is present, without any evidence of aortic stenosis. Pulmonic Valve: The pulmonic valve was normal in structure. Pulmonic valve regurgitation is not visualized. Aorta: The aortic root is normal in size and structure. Venous: The inferior vena cava is dilated in size with less than 50%  respiratory variability, suggesting right atrial pressure of 15 mmHg. IAS/Shunts: No atrial level shunt detected by color flow Doppler. Thurmon Fair MD Electronically signed by Thurmon Fair MD Signature Date/Time: 05/14/2022/12:52:08 PM    Final    DG Chest Port 1 View  Result Date: 05/13/2022 CLINICAL DATA:  Shortness of breath, atrial fibrillation with rapid ventricular response EXAM: PORTABLE CHEST 1 VIEW COMPARISON:  Portable exam 1404 hours compared to 11/23/2021 FINDINGS: Enlargement of cardiac silhouette. Mediastinal contours and pulmonary vascularity normal. Atherosclerotic calcification aorta. Bibasilar consolidation favor multifocal pneumonia over are locked assist or edema. Upper lungs clear. No pleural effusion or pneumothorax. Intraspinal stimulator noted. IMPRESSION: Enlargement of cardiac silhouette with bibasilar infiltrates favoring multifocal pneumonia as above. Aortic Atherosclerosis (ICD10-I70.0). Electronically Signed   By: Ulyses Southward M.D.   On: 05/13/2022 14:17     Cardiac Studies   Echo as above Patient Profile     81 y.o. male with PMH of CAD, PAD, chronic diastolic heart failure, paroxysmal atrial fibrillation, and hypertension who presented with several days of shortness of breath, found to have HFrEF with decompensated HF.  Assessment & Plan    Principal Problem:   Acute exacerbation of CHF (congestive heart failure) ([name]) Active Problems:   Coronary artery disease involving  native coronary artery of native heart without angina pectoris   Mixed hyperlipidemia   Obesity (BMI 30-39.9)- negative sleep study in the past   Atrial fibrillation with RVR ([name])   Hypertensive emergency   Acute respiratory failure with hypoxia ([name])   Chronic kidney disease, stage 3a ([name])   Elevated troponin   GERD (gastroesophageal reflux disease)   Acquired hypothyroidism   Primary hypertension   Longstanding persistent atrial fibrillation ([name])   Acute combined systolic and  diastolic heart failure ([name])  Acute systolic and diastolic heart failure (previously chronic diastolic heart failure) Chronic kidney disease stage 3a -echo shows new reduction in systolic function -improving volume status, near euvolemic. May require lasix 40 IV to have effect due to CKD, consider lasix 40 mg IV daily. -restarted lower dose entresto for afterload reduction/hypertension -renal function stable, abnl -strict I/O, ordered fluid restriction, daily weights -continue farxiga, metoprolol (change to succinate prior to discharge). Adding back low dose imdur given elevated BP, but need to monitor with intermittently low BP -holding spironolactone - anticipate cath Tuesday sept 5.    Reduced EF Known CAD with prior RCA stent Known PAD -Echo with EF 60-65% -> 50-55%, now 25-30% with wall motion abnormalities. -given known CAD, plan for left heart cath with coronary angiography, as well as right heart cath. See consent 05/15/22 -continue clopidogrel -he has not been taking statin. Atorvastatin 40 mg was on his outside med list but he reports not taking. Will start this.   Atrial fibrillation, persistent vs paroxysmal -CHA2DS2/VASC 6 - appears to be in SR, will confirm with EKG. -continue apixaban for now. Will hold eliquis starting 9/3 am (2 day hold), would start heparin given chads2 vasc score.   Hypokalemia -replete today   Hypertension -have restarted entresto at a lower dose (due to concerns for kidney function). Hydralazine added today, imdur also increased.  D/w daughter Lynnell Dike by phone in room with patient. Discussed plan of care in detail.      For questions or updates, please contact [facility] Please consult [email or web address] for contact info under        Signed, Parke Poisson, MD  05/17/2022, 11:55 AM

## 2022-05-18 DIAGNOSIS — I5041 Acute combined systolic (congestive) and diastolic (congestive) heart failure: Secondary | ICD-10-CM | POA: Diagnosis not present

## 2022-05-18 DIAGNOSIS — I2510 Atherosclerotic heart disease of native coronary artery without angina pectoris: Secondary | ICD-10-CM | POA: Diagnosis not present

## 2022-05-18 DIAGNOSIS — I5043 Acute on chronic combined systolic (congestive) and diastolic (congestive) heart failure: Secondary | ICD-10-CM | POA: Diagnosis not present

## 2022-05-18 DIAGNOSIS — I4811 Longstanding persistent atrial fibrillation: Secondary | ICD-10-CM | POA: Diagnosis not present

## 2022-05-18 DIAGNOSIS — I10 Essential (primary) hypertension: Secondary | ICD-10-CM | POA: Diagnosis not present

## 2022-05-18 LAB — CULTURE, BLOOD (ROUTINE X 2)
Culture: NO GROWTH
Culture: NO GROWTH
Special Requests: ADEQUATE

## 2022-05-18 LAB — BASIC METABOLIC PANEL
Anion gap: 7 (ref 5–15)
BUN: 22 mg/dL (ref 8–23)
CO2: 30 mmol/L (ref 22–32)
Calcium: 8.7 mg/dL — ABNORMAL LOW (ref 8.9–10.3)
Chloride: 101 mmol/L (ref 98–111)
Creatinine, Ser: 1.78 mg/dL — ABNORMAL HIGH (ref 0.61–1.24)
GFR, Estimated: 38 mL/min — ABNORMAL LOW (ref >60)
Glucose, Bld: 123 mg/dL — ABNORMAL HIGH (ref 70–99)
Potassium: 3.6 mmol/L (ref 3.5–5.1)
Sodium: 138 mmol/L (ref 135–145)

## 2022-05-18 LAB — HEPARIN LEVEL (UNFRACTIONATED): Heparin Unfractionated: 1 IU/mL — ABNORMAL HIGH (ref 0.30–0.70)

## 2022-05-18 LAB — GLUCOSE, CAPILLARY
Glucose-Capillary: 127 mg/dL — ABNORMAL HIGH (ref 70–99)
Glucose-Capillary: 172 mg/dL — ABNORMAL HIGH (ref 70–99)
Glucose-Capillary: 118 mg/dL — ABNORMAL HIGH (ref 70–99)
Glucose-Capillary: 184 mg/dL — ABNORMAL HIGH (ref 70–99)

## 2022-05-18 LAB — APTT
aPTT: 34 seconds (ref 24–36)
aPTT: 39 seconds — ABNORMAL HIGH (ref 24–36)

## 2022-05-18 MED ORDER — HYDRALAZINE HCL 50 MG PO TABS
75.0000 mg | ORAL_TABLET | Freq: Three times a day (TID) | ORAL | Status: DC
  Administered 2022-05-18 – 2022-05-19 (×6): 75 mg via ORAL
  Filled 2022-05-18 (×6): qty 1

## 2022-05-18 MED ORDER — HEPARIN BOLUS VIA INFUSION
2000.0000 [IU] | Freq: Once | INTRAVENOUS | Status: AC
  Administered 2022-05-18: 2000 [IU] via INTRAVENOUS
  Filled 2022-05-18: qty 2000

## 2022-05-18 MED ORDER — FUROSEMIDE 40 MG PO TABS
40.0000 mg | ORAL_TABLET | Freq: Every day | ORAL | Status: DC
  Administered 2022-05-18 – 2022-05-21 (×4): 40 mg via ORAL
  Filled 2022-05-18 (×4): qty 1

## 2022-05-18 MED ORDER — HEPARIN (PORCINE) 25000 UT/250ML-% IV SOLN
1800.0000 [IU]/h | INTRAVENOUS | Status: DC
  Administered 2022-05-18: 1300 [IU]/h via INTRAVENOUS
  Administered 2022-05-19: 1700 [IU]/h via INTRAVENOUS
  Administered 2022-05-19: 1900 [IU]/h via INTRAVENOUS
  Filled 2022-05-18 (×4): qty 250

## 2022-05-18 NOTE — Progress Notes (Signed)
ANTICOAGULATION CONSULT NOTE - Initial Consult  Pharmacy Consult for heparin Indication: atrial fibrillation  Allergies  Allergen Reactions   Fentanyl Other (See Comments)    Behavioral changes   Gabapentin Other (See Comments)    ankle swells   Lisinopril Other (See Comments)    unknown   Lyrica [Pregabalin] Other (See Comments)    unknown   Metformin Diarrhea   Propofol Other (See Comments)    Heart rate dropped    Patient Measurements: Height: 5\' 10"  (177.8 cm) Weight: 99.8 kg (220 lb 0.3 oz) IBW/kg (Calculated) : 73 Heparin Dosing Weight: 93.8 kg  Vital Signs: Temp: 97.7 F (36.5 C) (09/03 0900) Temp Source: Oral (09/03 0900) BP: 170/76 (09/03 0900) Pulse Rate: 84 (09/03 0900)  Labs: Recent Labs    05/16/22 0529 05/17/22 0259 05/18/22 0231  HGB 13.7  --   --   HCT 41.8  --   --   PLT 233  --   --   CREATININE 1.72* 1.71* 1.78*    Estimated Creatinine Clearance: 39.2 mL/min (A) (by C-G formula based on SCr of 1.78 mg/dL (H)).   Medical History: Past Medical History:  Diagnosis Date   Atrial fibrillation ([name]) 03/30/2020   Bradycardia, sinus 02/18/2013   CAD (coronary artery disease)    stent to RCA 2003 also had 40% lesion at that time; NUCLEAR STRESS TEST, 01/16/2010 - normal  now with cath 80-90% stenosis in LAD, will try medical therapy if no improvement PCI   Carotid artery disease ([name])    Cataract    CHF (congestive heart failure) ([name])    Claudication ([name])    LEA DUPLEX, 07/07/2008 - Normal   COPD (chronic obstructive pulmonary disease) ([name])    DDD (degenerative disc disease) 2008   Diabetes mellitus    GERD (gastroesophageal reflux disease)    H/O hiatal hernia    History of colonoscopy 2004   finding of tics and AMV only no polpys   Hyperlipidemia 02/05/2013   Hypertension    Onychomycosis    Rosacea    Tobacco abuse 02/05/2013    Medications:  Scheduled:   atorvastatin  40 mg Oral Daily   clopidogrel  75 mg Oral Daily    dapagliflozin propanediol  10 mg Oral Daily   dextromethorphan-guaiFENesin  1 tablet Oral BID   furosemide  40 mg Oral Daily   hydrALAZINE  75 mg Oral TID   insulin aspart  0-5 Units Subcutaneous QHS   insulin aspart  0-9 Units Subcutaneous TID WC   isosorbide mononitrate  60 mg Oral Daily   levothyroxine  25 mcg Oral Q0600   metoprolol tartrate  12.5 mg Oral BID   pantoprazole  40 mg Oral QODAY   sacubitril-valsartan  1 tablet Oral BID   Assessment: 81 YO male presenting 8/29 with worsening shortness of breath, in afib RVR. LVEF now 25-30% from 50-55% in 3/23. Planning for heart cath on Tuesday, 9/5. Patient has on apixaban 2.5mg  BID during admission due to age and poor renal function. Last dose on 9/2 @ 2050 and now held due to upcoming heart cath procedure. Pharmacy consulted for heparin dosing.   Last reported hgb 13.7 on 9/1 and plts 233 on 9/1--stable.  Goal of Therapy:  Heparin level 0.3-0.7 units/ml aPTT 66-102 seconds Monitor platelets by anticoagulation protocol: Yes   Plan:  Check baseline aPTT and heparin level  Start heparin infusion at 1300 units/hr Check aPTT in 8 hours  Monitor CBC, heparin level, aPTT, s/sx of  bleeding daily  Cherylin Mylar, PharmD PGY1 Pharmacy Resident 9/3/202310:39 AM

## 2022-05-18 NOTE — Progress Notes (Signed)
ANTICOAGULATION CONSULT NOTE - Initial Consult  Pharmacy Consult for heparin Indication: atrial fibrillation  Allergies  Allergen Reactions   Fentanyl Other (See Comments)    Behavioral changes   Gabapentin Other (See Comments)    ankle swells   Lisinopril Other (See Comments)    unknown   Lyrica [Pregabalin] Other (See Comments)    unknown   Metformin Diarrhea   Propofol Other (See Comments)    Heart rate dropped    Patient Measurements: Height: 5\' 10"  (177.8 cm) Weight: 99.8 kg (220 lb 0.3 oz) IBW/kg (Calculated) : 73 Heparin Dosing Weight: 93.8 kg  Vital Signs: Temp: 98 F (36.7 C) (09/03 1627) Temp Source: Oral (09/03 1627) BP: 154/68 (09/03 1849) Pulse Rate: 74 (09/03 1627)  Labs: Recent Labs    05/16/22 0529 05/17/22 0259 05/18/22 0231 05/18/22 1048 05/18/22 1815  HGB 13.7  --   --   --   --   HCT 41.8  --   --   --   --   PLT 233  --   --   --   --   APTT  --   --   --  34 39*  HEPARINUNFRC  --   --   --  1.00*  --   CREATININE 1.72* 1.71* 1.78*  --   --      Estimated Creatinine Clearance: 39.2 mL/min (A) (by C-G formula based on SCr of 1.78 mg/dL (H)).   Medical History: Past Medical History:  Diagnosis Date   Atrial fibrillation ([name]) 03/30/2020   Bradycardia, sinus 02/18/2013   CAD (coronary artery disease)    stent to RCA 2003 also had 40% lesion at that time; NUCLEAR STRESS TEST, 01/16/2010 - normal  now with cath 80-90% stenosis in LAD, will try medical therapy if no improvement PCI   Carotid artery disease ([name])    Cataract    CHF (congestive heart failure) ([name])    Claudication ([name])    LEA DUPLEX, 07/07/2008 - Normal   COPD (chronic obstructive pulmonary disease) ([name])    DDD (degenerative disc disease) 2008   Diabetes mellitus    GERD (gastroesophageal reflux disease)    H/O hiatal hernia    History of colonoscopy 2004   finding of tics and AMV only no polpys   Hyperlipidemia 02/05/2013   Hypertension    Onychomycosis     Rosacea    Tobacco abuse 02/05/2013    Medications:  Scheduled:   atorvastatin  40 mg Oral Daily   clopidogrel  75 mg Oral Daily   dapagliflozin propanediol  10 mg Oral Daily   dextromethorphan-guaiFENesin  1 tablet Oral BID   furosemide  40 mg Oral Daily   heparin  2,000 Units Intravenous Once   hydrALAZINE  75 mg Oral TID   insulin aspart  0-5 Units Subcutaneous QHS   insulin aspart  0-9 Units Subcutaneous TID WC   isosorbide mononitrate  60 mg Oral Daily   levothyroxine  25 mcg Oral Q0600   metoprolol tartrate  12.5 mg Oral BID   pantoprazole  40 mg Oral QODAY   sacubitril-valsartan  1 tablet Oral BID   Assessment: 81 YO male presenting 8/29 with worsening shortness of breath, in afib RVR. LVEF now 25-30% from 50-55% in 3/23. Planning for heart cath on Tuesday, 9/5. Patient has on apixaban 2.5mg  BID during admission due to age and poor renal function. Last dose on 9/2 @ 2050 and now held due to upcoming heart cath procedure. Pharmacy  consulted for heparin dosing.   Last reported hgb 13.7 on 9/1 and plts 233 on 9/1--stable.  PTT came back low this PM at 39. We will give a small bolus and increase in rate. Check level in  AM.   Goal of Therapy:  Heparin level 0.3-0.7 units/ml aPTT 66-102 seconds Monitor platelets by anticoagulation protocol: Yes   Plan:  Heparin bolus 2000 units x1 Increase heparin infusion to 1500 units/hr Check aPTT/HL in AM Monitor CBC, heparin level, aPTT, s/sx of bleeding daily  Ulyses Southward, PharmD, [place], AAHIVP, CPP Infectious Disease Pharmacist 05/18/2022 7:29 PM

## 2022-05-18 NOTE — Plan of Care (Signed)
  Problem: Clinical Measurements: Goal: Respiratory complications will improve Outcome: Progressing   Problem: Activity: Goal: Risk for activity intolerance will decrease Outcome: Progressing   

## 2022-05-18 NOTE — Progress Notes (Signed)
Rounding Note    Patient Name: Albert Powell Date of Encounter: 05/18/2022  [facility] Cardiologist: Nanetta Batty, MD   Subjective   No CP or SOB.  Inpatient Medications    Scheduled Meds:  atorvastatin  40 mg Oral Daily   clopidogrel  75 mg Oral Daily   dapagliflozin propanediol  10 mg Oral Daily   dextromethorphan-guaiFENesin  1 tablet Oral BID   furosemide  40 mg Oral Daily   hydrALAZINE  75 mg Oral TID   insulin aspart  0-5 Units Subcutaneous QHS   insulin aspart  0-9 Units Subcutaneous TID WC   isosorbide mononitrate  60 mg Oral Daily   levothyroxine  25 mcg Oral Q0600   metoprolol tartrate  12.5 mg Oral BID   pantoprazole  40 mg Oral QODAY   sacubitril-valsartan  1 tablet Oral BID   Continuous Infusions:  PRN Meds: acetaminophen **OR** acetaminophen, guaiFENesin-dextromethorphan, hydrALAZINE   Vital Signs    Vitals:   05/18/22 0410 05/18/22 0505 05/18/22 0900 05/18/22 1000  BP: (!) 200/88 (!) 166/74 (!) 170/76   Pulse: 73  84   Resp:   16   Temp: 97.7 F (36.5 C)  97.7 F (36.5 C)   TempSrc: Oral  Oral   SpO2: 95%  94%   Weight: 99.8 kg   99.8 kg  Height:    5\' 10"  (1.778 m)    Intake/Output Summary (Last 24 hours) at 05/18/2022 1101 Last data filed at 05/18/2022 0927 Gross per 24 hour  Intake 480 ml  Output 1951 ml  Net -1471 ml      05/18/2022   10:00 AM 05/18/2022    4:10 AM 05/17/2022    3:21 AM  Last 3 Weights  Weight (lbs) 220 lb 0.3 oz 220 lb 0.3 oz 222 lb 3.2 oz  Weight (kg) 99.8 kg 99.8 kg 100.789 kg      Telemetry    SR first deg AVB, PACs - Personally Reviewed  ECG    SR first deg AVB, PACs, inf infarct pattern - Personally Reviewed  Physical Exam   GEN: No acute distress.   Neck: No JVD Cardiac: RRR, no murmurs, rubs, or gallops.  Respiratory: end expiratory wheeze GI: Soft, nontender, non-distended  MS: No edema; No deformity. Neuro:  Nonfocal  Psych: Normal affect   Labs    High Sensitivity Troponin:    Recent Labs  Lab 05/13/22 1627 05/13/22 2131  TROPONINIHS 96* 85*     Chemistry Recent Labs  Lab 05/13/22 1627 05/13/22 1639 05/14/22 0647 05/15/22 0332 05/16/22 0529 05/17/22 0259 05/18/22 0231  NA 137   < > 136   < > 137 139 138  K 3.5   < > 2.8*   < > 3.4* 3.5 3.6  CL 105   < > 98   < > 102 100 101  CO2 21*  --  24   < > 24 29 30   GLUCOSE 176*   < > 175*   < > 119* 127* 123*  BUN 12   < > 10   < > 21 24* 22  CREATININE 1.57*   < > 1.53*   < > 1.72* 1.71* 1.78*  CALCIUM 9.2  --  8.4*   < > 8.3* 8.6* 8.7*  MG 2.0  --  1.9  --   --   --   --   PROT 6.5  --  5.7*  --   --   --   --  ALBUMIN 3.5  --  3.1*  --   --   --   --   AST 14*  --  16  --   --   --   --   ALT 14  --  16  --   --   --   --   ALKPHOS 57  --  48  --   --   --   --   BILITOT 1.0  --  1.2  --   --   --   --   GFRNONAA 44*  --  46*   < > 40* 40* 38*  ANIONGAP 11  --  14   < > 11 10 7    < > = values in this interval not displayed.    Lipids  Recent Labs  Lab 05/16/22 0529  CHOL 136  TRIG 115  HDL 43  LDLCALC 70  CHOLHDL 3.2    Hematology Recent Labs  Lab 05/14/22 0647 05/15/22 0332 05/16/22 0529  WBC 10.2 9.1 9.8  RBC 4.23 4.28 4.59  HGB 12.7* 13.0 13.7  HCT 38.2* 38.8* 41.8  MCV 90.3 90.7 91.1  MCH 30.0 30.4 29.8  MCHC 33.2 33.5 32.8  RDW 13.7 14.0 14.0  PLT 230 214 233   Thyroid No results for input(s): "TSH", "FREET4" in the last 168 hours.  BNP Recent Labs  Lab 05/13/22 1627  BNP >4,500.0*    DDimer No results for input(s): "DDIMER" in the last 168 hours.   Radiology    DG Chest 2 View  Result Date: 05/14/2022 CLINICAL DATA:  Shortness of breath EXAM: CHEST - 2 VIEW COMPARISON:  Previous studies including the examination of 05/13/2022 FINDINGS: Transverse diameter of heart is slightly increased. Central pulmonary vessels are prominent without signs of alveolar pulmonary edema. There is increased density in both lower lung fields. There is slight improvement in this  finding. There are no new focal infiltrates. There is no pneumothorax. There is blunting of both posterior costophrenic angles. Pain control lead is seen in thoracic spinal canal. IMPRESSION: Increased density in both lower lung fields may be due to small bilateral effusions and possibly underlying atelectasis/pneumonia. There are no signs of alveolar pulmonary edema or new focal infiltrates. Electronically Signed   By: Elmer Picker M.D.   On: 05/14/2022 15:23   ECHOCARDIOGRAM LIMITED  Result Date: 05/14/2022    ECHOCARDIOGRAM LIMITED REPORT   Patient Name:   Albert Powell Date of Exam: 05/14/2022 Medical Rec #:  [id number]        Height:       70.0 in Accession #:    [id number]       Weight:       224.0 lb Date of Birth:  12/21/1940        BSA:          2.190 m Patient Age:    81 years         BP:           161/99 mmHg Patient Gender: M                HR:           102 bpm. Exam Location:  Inpatient Procedure: Limited Echo Indications:    CHF Acute Systolic [id number]  History:        Patient has prior history of Echocardiogram examinations, most                 recent  11/25/2021. CHF, CAD and Angina, PAD,                 Arrythmias:Bradycardia and Atrial Fibrillation; Risk                 Factors:Hypertension, Diabetes and Dyslipidemia. Elevated                 Troponin.  Sonographer:    Lucendia Herrlich Referring Phys: [id number] OLADAPO ADEFESO IMPRESSIONS  1. Left ventricular ejection fraction, by estimation, is 25 to 30%. The left ventricle has severely decreased function. The left ventricle demonstrates regional wall motion abnormalities (see scoring diagram/findings for description).  2. Right ventricular systolic function is normal. The right ventricular size is normal. Tricuspid regurgitation signal is inadequate for assessing PA pressure.  3. Trivial mitral valve regurgitation. No evidence of mitral stenosis.  4. The aortic valve is tricuspid. There is mild calcification of the aortic valve. There is  mild thickening of the aortic valve. Aortic valve sclerosis/calcification is present, without any evidence of aortic stenosis.  5. The inferior vena cava is dilated in size with <50% respiratory variability, suggesting right atrial pressure of 15 mmHg. Comparison(s): Prior images reviewed side by side. The left ventricular function is significantly worse. The left ventricular wall motion abnormalities are significantly worse. FINDINGS  Left Ventricle: Left ventricular ejection fraction, by estimation, is 25 to 30%. The left ventricle has severely decreased function. The left ventricle demonstrates regional wall motion abnormalities. Severe hypokinesis of the left ventricular, basal-mid inferoseptal wall, inferior wall and anterolateral wall. Mild hypokinesis of the left ventricular, entire anteroseptal wall, anterior wall and apical segment. The left ventricular internal cavity size was normal in size. There is no left ventricular hypertrophy. Right Ventricle: The right ventricular size is normal. No increase in right ventricular wall thickness. Right ventricular systolic function is normal. Tricuspid regurgitation signal is inadequate for assessing PA pressure. Right Atrium: Right atrial size was normal in size. Pericardium: Trivial pericardial effusion is present. Mitral Valve: Mild to moderate mitral annular calcification. Trivial mitral valve regurgitation. No evidence of mitral valve stenosis. Tricuspid Valve: The tricuspid valve is normal in structure. Tricuspid valve regurgitation is not demonstrated. Aortic Valve: The aortic valve is tricuspid. There is mild calcification of the aortic valve. There is mild thickening of the aortic valve. Aortic valve sclerosis/calcification is present, without any evidence of aortic stenosis. Pulmonic Valve: The pulmonic valve was normal in structure. Pulmonic valve regurgitation is not visualized. Aorta: The aortic root is normal in size and structure. Venous: The inferior  vena cava is dilated in size with less than 50% respiratory variability, suggesting right atrial pressure of 15 mmHg. IAS/Shunts: No atrial level shunt detected by color flow Doppler. Rachelle Hora Croitoru MD Electronically signed by Thurmon Fair MD Signature Date/Time: 05/14/2022/12:52:08 PM    Final      Cardiac Studies   Echo as above Patient Profile     81 y.o. male with PMH of CAD, PAD, chronic diastolic heart failure, paroxysmal atrial fibrillation, and hypertension who presented with several days of shortness of breath, found to have HFrEF with decompensated HF.  Assessment & Plan    Principal Problem:   Acute exacerbation of CHF (congestive heart failure) ([name]) Active Problems:   Coronary artery disease involving native coronary artery of native heart without angina pectoris   Mixed hyperlipidemia   Obesity (BMI 30-39.9)- negative sleep study in the past   Atrial fibrillation with RVR [facility])   Hypertensive emergency   Acute respiratory  failure with hypoxia ([name])   Chronic kidney disease, stage 3a ([name])   Elevated troponin   GERD (gastroesophageal reflux disease)   Acquired hypothyroidism   Primary hypertension   Longstanding persistent atrial fibrillation ([name])   Acute combined systolic and diastolic heart failure ([name])  Acute systolic and diastolic heart failure (previously chronic diastolic heart failure) Chronic kidney disease stage 3a -echo shows new reduction in systolic function -improving volume status, near euvolemic. Primary team transitioned to lasix 40 mg po daily.  -restarted lower dose entresto for afterload reduction/hypertension -renal function stable, abnl -strict I/O, ordered fluid restriction, daily weights -continue farxiga, metoprolol (change to succinate prior to discharge) -holding spironolactone -imdur 60 mg daily.  - anticipate cath Tuesday sept 5.    Reduced EF Known CAD with prior RCA stent Known PAD -Echo with EF 60-65% -> 50-55%, now 25-30% with  wall motion abnormalities. -given known CAD, plan for left heart cath with coronary angiography, as well as right heart cath. See consent 05/15/22 -continue clopidogrel -he has not been taking statin. Atorvastatin 40 mg was on his outside med list but he reports not taking. Will start this.   Atrial fibrillation, persistent vs paroxysmal -CHA2DS2/VASC 6 - appears to be in SR, will confirm with EKG. -continue apixaban for now. Will hold eliquis starting 9/3 am (2 day hold), would start heparin given chads2 vasc score.   Hypokalemia -replete today   Hypertension -have restarted entresto at a lower dose (due to concerns for kidney function).  -hydralazine and imdur increased.     For questions or updates, please contact [facility] Please consult [email or web address] for contact info under        Signed, Parke Poisson, MD  05/18/2022, 11:01 AM

## 2022-05-18 NOTE — Progress Notes (Signed)
PROGRESS NOTE    Albert Powell  FAO:[id number] DOB: 1941/05/10 DOA: 05/13/2022 PCP: [name], MD  81/M with history of CAD, chronic diastolic CHF, COPD, type 2 diabetes mellitus, atrial fibrillation, hypertension presented to the ED with shortness of breath for 2 to 3 days, hypoxic in the ER, noted to be in A-fib RVR, systolic blood pressure in the 240 range.  Work-up noted creatinine of 1.57, troponin of 96, BNP 4500, chest x-ray noted bibasilar infiltrates, possibly multifocal pneumonia. -Improving with diuretics, echo noted drop in EF to 25-30% -Cards consulting, plan for left heart cath on Tuesday 9/5  Subjective: -Feels okay, no events overnight, breathing back to baseline  Assessment and Plan:  Acute on chronic systolic and diastolic CHF Hypertensive urgency -Echo 3/23 noted EF 50-55%, grade 1 DD -Repeat echo noted drop in EF to 25-30% -symptoms primarily related to CHF, doubt pneumonia -Diuresed well on IV Lasix, he is 7.3 L negative, creatinine stable at 1.7, switch to oral Lasix today -Continue Farxiga, continue low-dose Entresto, monitor kidney function closely -Plan for left heart cath on Tuesday, hold Eliquis today  Hypokalemia -Repleted  ?  Multifocal pneumonia -Clinically low suspicion for infectious process, no fever or leukocytosis, procalcitonin is low as well -Started on empiric ABX on admission, repeat chest x-ray indicating bilateral pleural effusions and atelectasis -Completed 5 days of antibiotics  CKD 3b -Stable, monitor with diuresis, baseline creatinine around 1.4-1.7  Paroxysmal A-fib with RVR -Heart rate controlled, Eliquis for cath, will start IV heparin  Hypothyroidism -Continue Synthroid  Type 2 diabetes mellitus -Continue Farxiga, glipizide on hold  Obesity   DVT prophylaxis: Eliquis Code Status: Full code Family Communication: No need bedside, left message for daughter Disposition Plan: Home after cath next  week  Consultants: Cards   Procedures:   Antimicrobials:    Objective: Vitals:   05/18/22 0505 05/18/22 0900 05/18/22 1000 05/18/22 1100  BP: (!) 166/74 (!) 170/76  (!) 141/60  Pulse:  84  66  Resp:  16  18  Temp:  97.7 F (36.5 C)  97.8 F (36.6 C)  TempSrc:  Oral  Oral  SpO2:  94%  96%  Weight:   99.8 kg   Height:   5\' 10"  (1.778 m)     Intake/Output Summary (Last 24 hours) at 05/18/2022 1132 Last data filed at 05/18/2022 1100 Gross per 24 hour  Intake 480 ml  Output 2351 ml  Net -1871 ml   Filed Weights   05/17/22 0321 05/18/22 0410 05/18/22 1000  Weight: 100.8 kg 99.8 kg 99.8 kg    Examination:  General exam: Obese elderly male sitting up in bed, AAOx3, no distress HEENT: No JVD CVS: S1-S2, regular rhythm Lungs: Decreased breath sounds to bases otherwise clear Abdomen: Soft, nontender, bowel sounds present Extremities: No edema Skin: No rashes Psychiatry:  Mood & affect appropriate.     Data Reviewed:   CBC: Recent Labs  Lab 05/13/22 1627 05/13/22 1639 05/14/22 0647 05/15/22 0332 05/16/22 0529  WBC 10.7*  --  10.2 9.1 9.8  NEUTROABS 9.0*  --   --   --   --   HGB 14.4 15.3 12.7* 13.0 13.7  HCT 42.9 45.0 38.2* 38.8* 41.8  MCV 89.6  --  90.3 90.7 91.1  PLT 264  --  230 214 233   Basic Metabolic Panel: Recent Labs  Lab 05/13/22 1627 05/13/22 1639 05/14/22 0647 05/15/22 0332 05/16/22 0529 05/17/22 0259 05/18/22 0231  NA 137   < > 136 140  137 139 138  K 3.5   < > 2.8* 4.8 3.4* 3.5 3.6  CL 105   < > 98 103 102 100 101  CO2 21*  --  24 24 24 29 30   GLUCOSE 176*   < > 175* 132* 119* 127* 123*  BUN 12   < > 10 17 21  24* 22  CREATININE 1.57*   < > 1.53* 1.79* 1.72* 1.71* 1.78*  CALCIUM 9.2  --  8.4* 8.7* 8.3* 8.6* 8.7*  MG 2.0  --  1.9  --   --   --   --   PHOS  --   --  2.7  --   --   --   --    < > = values in this interval not displayed.   GFR: Estimated Creatinine Clearance: 39.2 mL/min (A) (by C-G formula based on SCr of 1.78 mg/dL  (H)). Liver Function Tests: Recent Labs  Lab 05/13/22 1627 05/14/22 0647  AST 14* 16  ALT 14 16  ALKPHOS 57 48  BILITOT 1.0 1.2  PROT 6.5 5.7*  ALBUMIN 3.5 3.1*   Recent Labs  Lab 05/13/22 1627  LIPASE 24   No results for input(s): "AMMONIA" in the last 168 hours. Coagulation Profile: No results for input(s): "INR", "PROTIME" in the last 168 hours. Cardiac Enzymes: No results for input(s): "CKTOTAL", "CKMB", "CKMBINDEX", "TROPONINI" in the last 168 hours. BNP (last 3 results) No results for input(s): "PROBNP" in the last 8760 hours. HbA1C: No results for input(s): "HGBA1C" in the last 72 hours.  CBG: Recent Labs  Lab 05/17/22 0600 05/17/22 1147 05/17/22 1624 05/17/22 2054 05/18/22 0653  GLUCAP 133* 129* 153* 180* 127*   Lipid Profile: Recent Labs    05/16/22 0529  CHOL 136  HDL 43  LDLCALC 70  TRIG 115  CHOLHDL 3.2   Thyroid Function Tests: No results for input(s): "TSH", "T4TOTAL", "FREET4", "T3FREE", "THYROIDAB" in the last 72 hours. Anemia Panel: No results for input(s): "VITAMINB12", "FOLATE", "FERRITIN", "TIBC", "IRON", "RETICCTPCT" in the last 72 hours. Urine analysis:    Component Value Date/Time   COLORURINE YELLOW 01/01/2021 1351   APPEARANCEUR CLEAR 01/01/2021 1351   LABSPEC 1.009 01/01/2021 1351   PHURINE 6.0 01/01/2021 1351   GLUCOSEU >=500 (A) 01/01/2021 1351   HGBUR SMALL (A) 01/01/2021 1351   BILIRUBINUR NEGATIVE 01/01/2021 1351   KETONESUR NEGATIVE 01/01/2021 1351   PROTEINUR >=300 (A) 01/01/2021 1351   UROBILINOGEN 1.0 05/31/2013 1432   NITRITE NEGATIVE 01/01/2021 1351   LEUKOCYTESUR NEGATIVE 01/01/2021 1351   Sepsis Labs: @LABRCNTIP (procalcitonin:4,lacticidven:4)  ) Recent Results (from the past 240 hour(s))  Resp Panel by RT-PCR (Flu A&B, Covid) Anterior Nasal Swab     Status: None   Collection Time: 05/13/22  2:26 PM   Specimen: Anterior Nasal Swab  Result Value Ref Range Status   SARS Coronavirus 2 by RT PCR NEGATIVE  NEGATIVE Final    Comment: (NOTE) SARS-CoV-2 target nucleic acids are NOT DETECTED.  The SARS-CoV-2 RNA is generally detectable in upper respiratory specimens during the acute phase of infection. The lowest concentration of SARS-CoV-2 viral copies this assay can detect is 138 copies/mL. A negative result does not preclude SARS-Cov-2 infection and should not be used as the sole basis for treatment or other patient management decisions. A negative result may occur with  improper specimen collection/handling, submission of specimen other than nasopharyngeal swab, presence of viral mutation(s) within the areas targeted by this assay, and inadequate number of viral copies(<138 copies/mL).  A negative result must be combined with clinical observations, patient history, and epidemiological information. The expected result is Negative.  Fact Sheet for Patients:  BloggerCourse.com  Fact Sheet for Healthcare Providers:  SeriousBroker.it  This test is no t yet approved or cleared by the [place] FDA and  has been authorized for detection and/or diagnosis of SARS-CoV-2 by FDA under an Emergency Use Authorization (EUA). This EUA will remain  in effect (meaning this test can be used) for the duration of the COVID-19 declaration under Section 564(b)(1) of the Act, 21 U.S.C.section 360bbb-3(b)(1), unless the authorization is terminated  or revoked sooner.       Influenza A by PCR NEGATIVE NEGATIVE Final   Influenza B by PCR NEGATIVE NEGATIVE Final    Comment: (NOTE) The Xpert Xpress SARS-CoV-2/FLU/RSV plus assay is intended as an aid in the diagnosis of influenza from Nasopharyngeal swab specimens and should not be used as a sole basis for treatment. Nasal washings and aspirates are unacceptable for Xpert Xpress SARS-CoV-2/FLU/RSV testing.  Fact Sheet for Patients: BloggerCourse.com  Fact Sheet for Healthcare  Providers: SeriousBroker.it  This test is not yet approved or cleared by the [place] FDA and has been authorized for detection and/or diagnosis of SARS-CoV-2 by FDA under an Emergency Use Authorization (EUA). This EUA will remain in effect (meaning this test can be used) for the duration of the COVID-19 declaration under Section 564(b)(1) of the Act, 21 U.S.C. section 360bbb-3(b)(1), unless the authorization is terminated or revoked.  Performed at [facility], [address] [place], [place] [address]   Blood culture (routine x 2)     Status: None (Preliminary result)   Collection Time: 05/13/22  4:27 PM   Specimen: BLOOD  Result Value Ref Range Status   Specimen Description BLOOD RIGHT ANTECUBITAL  Final   Special Requests   Final    BOTTLES DRAWN AEROBIC AND ANAEROBIC Blood Culture results may not be optimal due to an excessive volume of blood received in culture bottles   Culture   Final    NO GROWTH 4 DAYS Performed at [facility], [address] [place], [place] [address]    Report Status PENDING  Incomplete  Blood culture (routine x 2)     Status: None (Preliminary result)   Collection Time: 05/13/22  4:27 PM   Specimen: BLOOD LEFT HAND  Result Value Ref Range Status   Specimen Description BLOOD LEFT HAND  Final   Special Requests   Final    BOTTLES DRAWN AEROBIC AND ANAEROBIC Blood Culture adequate volume   Culture   Final    NO GROWTH 4 DAYS Performed at [facility], [address] [place], [place] [address]    Report Status PENDING  Incomplete     Radiology Studies: No results found.   Scheduled Meds:  atorvastatin  40 mg Oral Daily   clopidogrel  75 mg Oral Daily   dapagliflozin propanediol  10 mg Oral Daily   dextromethorphan-guaiFENesin  1 tablet Oral BID   furosemide  40 mg Oral Daily   hydrALAZINE  75 mg Oral TID   insulin aspart  0-5 Units Subcutaneous QHS   insulin aspart  0-9 Units Subcutaneous  TID WC   isosorbide mononitrate  60 mg Oral Daily   levothyroxine  25 mcg Oral Q0600   metoprolol tartrate  12.5 mg Oral BID   pantoprazole  40 mg Oral QODAY   sacubitril-valsartan  1 tablet Oral BID  Continuous Infusions:  heparin 1,300 Units/hr (05/18/22 1126)      LOS: 5 days    Time spent: 82min  Domenic Polite, MD [facility]   05/18/2022, 11:32 AM

## 2022-05-19 DIAGNOSIS — E785 Hyperlipidemia, unspecified: Secondary | ICD-10-CM

## 2022-05-19 DIAGNOSIS — E1169 Type 2 diabetes mellitus with other specified complication: Secondary | ICD-10-CM | POA: Diagnosis present

## 2022-05-19 DIAGNOSIS — I5043 Acute on chronic combined systolic (congestive) and diastolic (congestive) heart failure: Secondary | ICD-10-CM

## 2022-05-19 DIAGNOSIS — I2510 Atherosclerotic heart disease of native coronary artery without angina pectoris: Secondary | ICD-10-CM | POA: Diagnosis not present

## 2022-05-19 DIAGNOSIS — J9601 Acute respiratory failure with hypoxia: Secondary | ICD-10-CM | POA: Diagnosis not present

## 2022-05-19 DIAGNOSIS — I4891 Unspecified atrial fibrillation: Secondary | ICD-10-CM | POA: Diagnosis not present

## 2022-05-19 DIAGNOSIS — E039 Hypothyroidism, unspecified: Secondary | ICD-10-CM | POA: Diagnosis not present

## 2022-05-19 DIAGNOSIS — I5041 Acute combined systolic (congestive) and diastolic (congestive) heart failure: Secondary | ICD-10-CM | POA: Diagnosis not present

## 2022-05-19 DIAGNOSIS — N1831 Chronic kidney disease, stage 3a: Secondary | ICD-10-CM | POA: Diagnosis not present

## 2022-05-19 DIAGNOSIS — I480 Paroxysmal atrial fibrillation: Secondary | ICD-10-CM

## 2022-05-19 LAB — CBC
HCT: 40.5 % (ref 39.0–52.0)
Hemoglobin: 13.7 g/dL (ref 13.0–17.0)
MCH: 30.5 pg (ref 26.0–34.0)
MCHC: 33.8 g/dL (ref 30.0–36.0)
MCV: 90.2 fL (ref 80.0–100.0)
Platelets: 245 10*3/uL (ref 150–400)
RBC: 4.49 MIL/uL (ref 4.22–5.81)
RDW: 14.1 % (ref 11.5–15.5)
WBC: 8 10*3/uL (ref 4.0–10.5)
nRBC: 0 % (ref 0.0–0.2)

## 2022-05-19 LAB — HEPARIN LEVEL (UNFRACTIONATED): Heparin Unfractionated: 0.69 IU/mL (ref 0.30–0.70)

## 2022-05-19 LAB — BASIC METABOLIC PANEL
Anion gap: 8 (ref 5–15)
BUN: 22 mg/dL (ref 8–23)
CO2: 26 mmol/L (ref 22–32)
Calcium: 8.9 mg/dL (ref 8.9–10.3)
Chloride: 104 mmol/L (ref 98–111)
Creatinine, Ser: 1.72 mg/dL — ABNORMAL HIGH (ref 0.61–1.24)
GFR, Estimated: 40 mL/min — ABNORMAL LOW (ref >60)
Glucose, Bld: 115 mg/dL — ABNORMAL HIGH (ref 70–99)
Potassium: 4 mmol/L (ref 3.5–5.1)
Sodium: 138 mmol/L (ref 135–145)

## 2022-05-19 LAB — GLUCOSE, CAPILLARY
Glucose-Capillary: 115 mg/dL — ABNORMAL HIGH (ref 70–99)
Glucose-Capillary: 162 mg/dL — ABNORMAL HIGH (ref 70–99)
Glucose-Capillary: 157 mg/dL — ABNORMAL HIGH (ref 70–99)
Glucose-Capillary: 172 mg/dL — ABNORMAL HIGH (ref 70–99)

## 2022-05-19 LAB — APTT
aPTT: 57 seconds — ABNORMAL HIGH (ref 24–36)
aPTT: 59 seconds — ABNORMAL HIGH (ref 24–36)

## 2022-05-19 MED ORDER — SODIUM CHLORIDE 0.9% FLUSH
3.0000 mL | Freq: Two times a day (BID) | INTRAVENOUS | Status: DC
  Administered 2022-05-20: 3 mL via INTRAVENOUS

## 2022-05-19 MED ORDER — SODIUM CHLORIDE 0.9% FLUSH: 3.0000 mL | INTRAVENOUS | Status: DC | PRN

## 2022-05-19 MED ORDER — SODIUM CHLORIDE 0.9 % IV SOLN: 250.0000 mL | INTRAVENOUS | Status: DC | PRN

## 2022-05-19 MED ORDER — SODIUM CHLORIDE 0.9 % IV SOLN: INTRAVENOUS | Status: DC

## 2022-05-19 MED ORDER — ASPIRIN 81 MG PO CHEW
81.0000 mg | CHEWABLE_TABLET | ORAL | Status: AC
  Administered 2022-05-20: 81 mg via ORAL
  Filled 2022-05-19: qty 1

## 2022-05-19 NOTE — Progress Notes (Signed)
  Progress Note   Patient: Albert Powell:[id number] DOB: April 25, 1941 DOA: 05/13/2022     6 DOS: the patient was seen and examined on 05/19/2022   Brief hospital course: 81/M with history of CAD, chronic diastolic CHF, COPD, type 2 diabetes mellitus, atrial fibrillation, hypertension presented to the ED with shortness of breath for 2 to 3 days, hypoxic in the ER, noted to be in A-fib RVR, systolic blood pressure in the 240 range.  Work-up noted creatinine of 1.57, troponin of 96, BNP 4500, chest x-ray noted bibasilar infiltrates, possibly multifocal pneumonia. -Improving with diuretics, echo noted drop in EF to 25-30% -Cards consulting, plan for left heart cath on Tuesday 9/5  Assessment and Plan: * Acute combined systolic and diastolic heart failure ([name]) Echocardiogram with reduced LV systolic function 25 to 30%, RV systolic function preserved.  Severe hypokinesis of the left ventricular basal mid inferospetal wall, inferior wall and antero lateral wall. Mild hypokinesis of the left ventricular entire anteroseptal wall, anterior wall and apical segment.  Worsening wall motion abnormalities.  Urine output is  1,050  Systolic blood pressure is 160 to 190 mmHg.  Plan to continue medical therapy with dapagliflozin,  Hydralazine, isosorbide, metoprolol and entresto Continue diuresis with oral furosemide.  Atrial fibrillation with RVR ([name]) Continue rate control with metoprolol and anticoagulation with heparin in preparation for cardiac catheterization.  Continue telemetry monitoring   Chronic kidney disease, stage 3a ([name]) Renal function stable with serum cr at 1,72,  K is 4,0 and serum bicarbonate at 26  Continue diuresis with furosemide and empagliflozin. Continue follow up renal function in am.   Acquired hypothyroidism Continue with levothyroxine./   Hypertensive emergency Blood pressure better controlled, continue with metoprolol, entresto and diuresis with furosemide and  empagliflozin.   Coronary artery disease involving native coronary artery of native heart without angina pectoris Worsening LV wall motion abnormalities Plan for cardiac catheterization in am. Continue anticoagulation with heparin  Antiplatelet therapy with clopidogrel.  Patient has been chest pain free.   GERD (gastroesophageal reflux disease) Continue with antiacid therapy.   Mixed hyperlipidemia Continue with statin therapy.   Obesity (BMI 30-39.9)- negative sleep study in the past Calculated BMI is 31,8   Type 2 diabetes mellitus with hyperlipidemia ([name]) Continue glucose cover and monitoring with insulin sliding scale Glucose has been stable, fasting this am 115 mg/dl Patient is tolerating po well.         Subjective: Patient with improvement in dyspnea, no chest pain   Physical Exam: Vitals:   05/18/22 1627 05/18/22 1849 05/18/22 2100 05/19/22 0641  BP: (!) 182/66 (!) 154/68 (!) 150/79 (!) 165/64  Pulse: 74  72 69  Resp: 18  19 20   Temp: 98 F (36.7 C)  97.9 F (36.6 C) 97.9 F (36.6 C)  TempSrc: Oral  Oral Oral  SpO2: 96%  97% 93%  Weight:    100.6 kg  Height:       Neurology awake and alert ENT with no pallor Cardiovascular with S1 and S2 present with no gallops Respiratory with no rales or wheezing Abdomen with no distention  No lower extremity edema  Data Reviewed:    Family Communication: no family at the bedside   Disposition: Status is: Inpatient Remains inpatient appropriate because: pending cardiac catheterization   Planned Discharge Destination: Home    Author: , MD 05/19/2022 1:58 PM  For on call review [email or web address].

## 2022-05-19 NOTE — Progress Notes (Signed)
Mobility Specialist Progress Note:   05/19/22 0855  Mobility  Activity Ambulated with assistance in hallway  Level of Assistance Modified independent, requires aide device or extra time  Assistive Device Front wheel walker  Distance Ambulated (ft) 200 ft  Activity Response Tolerated well  $Mobility charge 1 Mobility   During Mobility: 111 HR Post Mobility:   90 HR  Pt received in bed willing to participate in mobility. No complaints of pain. Left in bed with call bell in reach and all needs met.     Mobility Specialist  

## 2022-05-19 NOTE — Progress Notes (Signed)
ANTICOAGULATION CONSULT NOTE - Initial Consult  Pharmacy Consult for heparin Indication: atrial fibrillation  Allergies  Allergen Reactions   Fentanyl Other (See Comments)    Behavioral changes   Gabapentin Other (See Comments)    ankle swells   Lisinopril Other (See Comments)    unknown   Lyrica [Pregabalin] Other (See Comments)    unknown   Metformin Diarrhea   Propofol Other (See Comments)    Heart rate dropped    Patient Measurements: Height: 5\' 10"  (177.8 cm) Weight: 100.6 kg (221 lb 11.2 oz) IBW/kg (Calculated) : 73 Heparin Dosing Weight: 93.8 kg  Vital Signs: Temp: 97.9 F (36.6 C) (09/04 0641) Temp Source: Oral (09/04 0641) BP: 165/64 (09/04 0641) Pulse Rate: 69 (09/04 0641)  Labs: Recent Labs    05/17/22 0259 05/18/22 0231 05/18/22 1048 05/18/22 1048 05/18/22 1815 05/19/22 0435 05/19/22 1343  HGB  --   --   --   --   --  13.7  --   HCT  --   --   --   --   --  40.5  --   PLT  --   --   --   --   --  245  --   APTT  --   --  34   < > 39* 57* 59*  HEPARINUNFRC  --   --  1.00*  --   --  0.69  --   CREATININE 1.71* 1.78*  --   --   --  1.72*  --    < > = values in this interval not displayed.     Estimated Creatinine Clearance: 40.7 mL/min (A) (by C-G formula based on SCr of 1.72 mg/dL (H)).   Medical History: Past Medical History:  Diagnosis Date   Atrial fibrillation ([name]) 03/30/2020   Bradycardia, sinus 02/18/2013   CAD (coronary artery disease)    stent to RCA 2003 also had 40% lesion at that time; NUCLEAR STRESS TEST, 01/16/2010 - normal  now with cath 80-90% stenosis in LAD, will try medical therapy if no improvement PCI   Carotid artery disease ([name])    Cataract    CHF (congestive heart failure) ([name])    Claudication ([name])    LEA DUPLEX, 07/07/2008 - Normal   COPD (chronic obstructive pulmonary disease) ([name])    DDD (degenerative disc disease) 2008   Diabetes mellitus    GERD (gastroesophageal reflux disease)    H/O hiatal hernia     History of colonoscopy 2004   finding of tics and AMV only no polpys   Hyperlipidemia 02/05/2013   Hypertension    Onychomycosis    Rosacea    Tobacco abuse 02/05/2013    Medications:  Scheduled:   atorvastatin  40 mg Oral Daily   clopidogrel  75 mg Oral Daily   dapagliflozin propanediol  10 mg Oral Daily   dextromethorphan-guaiFENesin  1 tablet Oral BID   furosemide  40 mg Oral Daily   hydrALAZINE  75 mg Oral TID   insulin aspart  0-5 Units Subcutaneous QHS   insulin aspart  0-9 Units Subcutaneous TID WC   isosorbide mononitrate  60 mg Oral Daily   levothyroxine  25 mcg Oral Q0600   metoprolol tartrate  12.5 mg Oral BID   pantoprazole  40 mg Oral QODAY   sacubitril-valsartan  1 tablet Oral BID   Assessment: 81 YO male presenting 8/29 with worsening shortness of breath, in afib RVR. LVEF now 25-30% from 50-55% in 3/23.  Planning for heart cath on Tuesday, 9/5. Patient has on apixaban 2.5mg  BID during admission due to age and poor renal function. Last dose on 9/2 @ 2050 and now held due to upcoming heart cath procedure. Pharmacy consulted for heparin dosing.   aPTT increasing but still subtherapeutic at 59 after rate increase to 1700 units/hr. Hgb 13.7 and PLTs 245 stable. No issues with heparin infusion. Per RN small bleed from IV site but has now resolved.   Goal of Therapy:  Heparin level 0.3-0.7 units/ml aPTT 66-102 seconds Monitor platelets by anticoagulation protocol: Yes   Plan:  Increase heparin infusion to 1,900 units/hr Obtain aPTT/heparin level in 8 hr, dose using aPTT until correlates with heparin level  Monitor CBC, heparin level, aPTT, s/sx of bleeding daily  Larena Sox, PharmD PGY1 Pharmacy Resident   05/19/2022  3:07 PM

## 2022-05-19 NOTE — Assessment & Plan Note (Signed)
Continue with antiacid therapy.  ?

## 2022-05-19 NOTE — Assessment & Plan Note (Signed)
His glucose remained stable during his hospitalization, he was placed on insulin sliding scale.  Resume glipizide at discharge.  Plant continue statin therapy at the time of his discharge.

## 2022-05-19 NOTE — Assessment & Plan Note (Signed)
Patient had aggressive diuresis with furosemide Antihypertensive regimen has been adjusted, he will continue taking hydralazine, isosorbide, carvedilol and diuretic therapy with spironolactone, SGLT 2 inh and furosemide Follow up as outpatient.

## 2022-05-19 NOTE — Assessment & Plan Note (Addendum)
Patient was placed on furosemide for diuresis, blood pressure control with hydralazine and isosorbide. Peak serum cr at 1,79.   At the time of his discharge his renal function has improved, his serum cr is 1.62, with K at 3,9 and serum bicarbonate at 22.   Continue diuresis with furosemide, spironolactone and empagliflozin.

## 2022-05-19 NOTE — Assessment & Plan Note (Signed)
Heart rate improved with AV blockade. At the time of his discharge his heart rate is 60 to 70, continue with atrial fibrillation rhythm.  Plan to continue anticoagulation with apixaban.

## 2022-05-19 NOTE — Progress Notes (Signed)
Rounding Note    Patient Name: GENEROSO BATTENFIELD Date of Encounter: 05/19/2022  [facility] Cardiologist: Quay Burow, MD   Subjective  No acute events overnight. Breathing comfortably on room air.  Inpatient Medications    Scheduled Meds:  atorvastatin  40 mg Oral Daily   clopidogrel  75 mg Oral Daily   dapagliflozin propanediol  10 mg Oral Daily   dextromethorphan-guaiFENesin  1 tablet Oral BID   furosemide  40 mg Oral Daily   hydrALAZINE  75 mg Oral TID   insulin aspart  0-5 Units Subcutaneous QHS   insulin aspart  0-9 Units Subcutaneous TID WC   isosorbide mononitrate  60 mg Oral Daily   levothyroxine  25 mcg Oral Q0600   metoprolol tartrate  12.5 mg Oral BID   pantoprazole  40 mg Oral QODAY   sacubitril-valsartan  1 tablet Oral BID   Continuous Infusions:  heparin 1,700 Units/hr (05/19/22 0551)   PRN Meds: acetaminophen **OR** acetaminophen, guaiFENesin-dextromethorphan, hydrALAZINE   Vital Signs    Vitals:   05/18/22 1627 05/18/22 1849 05/18/22 2100 05/19/22 0641  BP: (!) 182/66 (!) 154/68 (!) 150/79 (!) 165/64  Pulse: 74  72 69  Resp: 18  19 20   Temp: 98 F (36.7 C)  97.9 F (36.6 C) 97.9 F (36.6 C)  TempSrc: Oral  Oral Oral  SpO2: 96%  97% 93%  Weight:    100.6 kg  Height:        Intake/Output Summary (Last 24 hours) at 05/19/2022 0830 Last data filed at 05/18/2022 2200 Gross per 24 hour  Intake 897.35 ml  Output 1050 ml  Net -152.65 ml      05/19/2022    6:41 AM 05/18/2022   10:00 AM 05/18/2022    4:10 AM  Last 3 Weights  Weight (lbs) 221 lb 11.2 oz 220 lb 0.3 oz 220 lb 0.3 oz  Weight (kg) 100.562 kg 99.8 kg 99.8 kg      Telemetry    SR first deg AVB, PACs - Personally Reviewed  ECG    SR first deg AVB, PACs, inf infarct pattern - Personally Reviewed  Physical Exam   GEN: Well nourished, well developed in no acute distress NECK: No JVD CARDIAC: regular rhythm, normal S1 and S2, no rubs or gallops. No murmur. VASCULAR:  Radial pulses 2+ bilaterally.  RESPIRATORY:  Clear to auscultation without rales, wheezing or rhonchi  ABDOMEN: Soft, non-tender, non-distended MUSCULOSKELETAL:  Moves all 4 limbs independently SKIN: Warm and dry, no edema NEUROLOGIC:  No focal neuro deficits noted. PSYCHIATRIC:  Normal affect    Labs    High Sensitivity Troponin:   Recent Labs  Lab 05/13/22 1627 05/13/22 2131  TROPONINIHS 96* 85*     Chemistry Recent Labs  Lab 05/13/22 1627 05/13/22 1639 05/14/22 0647 05/15/22 0332 05/17/22 0259 05/18/22 0231 05/19/22 0435  NA 137   < > 136   < > 139 138 138  K 3.5   < > 2.8*   < > 3.5 3.6 4.0  CL 105   < > 98   < > 100 101 104  CO2 21*  --  24   < > 29 30 26   GLUCOSE 176*   < > 175*   < > 127* 123* 115*  BUN 12   < > 10   < > 24* 22 22  CREATININE 1.57*   < > 1.53*   < > 1.71* 1.78* 1.72*  CALCIUM 9.2  --  8.4*   < > 8.6* 8.7* 8.9  MG 2.0  --  1.9  --   --   --   --   PROT 6.5  --  5.7*  --   --   --   --   ALBUMIN 3.5  --  3.1*  --   --   --   --   AST 14*  --  16  --   --   --   --   ALT 14  --  16  --   --   --   --   ALKPHOS 57  --  48  --   --   --   --   BILITOT 1.0  --  1.2  --   --   --   --   GFRNONAA 44*  --  46*   < > 40* 38* 40*  ANIONGAP 11  --  14   < > 10 7 8    < > = values in this interval not displayed.    Lipids  Recent Labs  Lab 05/16/22 0529  CHOL 136  TRIG 115  HDL 43  LDLCALC 70  CHOLHDL 3.2    Hematology Recent Labs  Lab 05/15/22 0332 05/16/22 0529 05/19/22 0435  WBC 9.1 9.8 8.0  RBC 4.28 4.59 4.49  HGB 13.0 13.7 13.7  HCT 38.8* 41.8 40.5  MCV 90.7 91.1 90.2  MCH 30.4 29.8 30.5  MCHC 33.5 32.8 33.8  RDW 14.0 14.0 14.1  PLT 214 233 245   Thyroid No results for input(s): "TSH", "FREET4" in the last 168 hours.  BNP Recent Labs  Lab 05/13/22 1627  BNP >4,500.0*    DDimer No results for input(s): "DDIMER" in the last 168 hours.   Radiology    No results found.   Cardiac Studies   Echo as above Patient Profile      81 y.o. male with PMH of CAD, PAD, chronic diastolic heart failure, paroxysmal atrial fibrillation, and hypertension who presented with several days of shortness of breath, found to have HFrEF with decompensated HF.  Assessment & Plan    Principal Problem:   Acute exacerbation of CHF (congestive heart failure) ([name]) Active Problems:   Coronary artery disease involving native coronary artery of native heart without angina pectoris   Mixed hyperlipidemia   Obesity (BMI 30-39.9)- negative sleep study in the past   Atrial fibrillation with RVR ([name])   Hypertensive emergency   Acute respiratory failure with hypoxia ([name])   Chronic kidney disease, stage 3a ([name])   Elevated troponin   GERD (gastroesophageal reflux disease)   Acquired hypothyroidism   Primary hypertension   Longstanding persistent atrial fibrillation ([name])   Acute combined systolic and diastolic heart failure ([name])  Acute systolic and diastolic heart failure (previously chronic diastolic heart failure) Chronic kidney disease stage 3a -echo shows new reduction in systolic function -improving volume status, near euvolemic. Primary team transitioned to lasix 40 mg po daily.  -restarted lower dose entresto for afterload reduction/hypertension -renal function stable, abnl -strict I/O, ordered fluid restriction, daily weights -continue farxiga, metoprolol (change to succinate prior to discharge) -holding spironolactone -imdur 60 mg daily.  - anticipate cath Tuesday sept 5. NPO at midnight.   Reduced EF Known CAD with prior RCA stent Known PAD -Echo with EF 60-65% -> 50-55%, now 25-30% with wall motion abnormalities. -given known CAD, plan for left heart cath with coronary angiography, as well as right heart cath. See consent  05/15/22 -continue clopidogrel -Atorvastatin 40 mg was on his outside med list but he reports not taking. Started this admission.   Atrial fibrillation, persistent vs paroxysmal -CHA2DS2/VASC  6 -in SR -continue apixaban for now. Will hold eliquis starting 9/3 am (2 day hold), started on heparin given chads2 vasc score.   Hypertension -have restarted entresto at a lower dose (due to concerns for kidney function).  -hydralazine and imdur increased.    For questions or updates, please contact [facility] Please consult [email or web address] for contact info under        Signed, Jodelle Red, MD  05/19/2022, 8:30 AM

## 2022-05-19 NOTE — Assessment & Plan Note (Addendum)
Worsening LV wall motion abnormalities Plan for cardiac catheterization in am. Continue anticoagulation with heparin  Antiplatelet therapy with clopidogrel.  Patient has been chest pain free.

## 2022-05-19 NOTE — Assessment & Plan Note (Signed)
Echocardiogram with reduced LV systolic function 25 to 30%, RV systolic function preserved.  Severe hypokinesis of the left ventricular basal mid inferospetal wall, inferior wall and antero lateral wall. Mild hypokinesis of the left ventricular entire anteroseptal wall, anterior wall and apical segment.  Worsening wall motion abnormalities.  Urine output is  1,050  Systolic blood pressure is 160 to 190 mmHg.  Plan to continue medical therapy with dapagliflozin,  Hydralazine, isosorbide, metoprolol and entresto Continue diuresis with oral furosemide.

## 2022-05-19 NOTE — Assessment & Plan Note (Signed)
Continue with statin therapy.  ?

## 2022-05-19 NOTE — Progress Notes (Signed)
ANTICOAGULATION CONSULT NOTE - Follow Up Consult  Pharmacy Consult for heparin Indication:  ACS/Afib  Labs: Recent Labs    05/17/22 0259 05/18/22 0231 05/18/22 1048 05/18/22 1815 05/19/22 0435  HGB  --   --   --   --  13.7  HCT  --   --   --   --  40.5  PLT  --   --   --   --  245  APTT  --   --  34 39* 57*  HEPARINUNFRC  --   --  1.00*  --  0.69  CREATININE 1.71* 1.78*  --   --  1.72*    Assessment: 81yo male subtherapeutic on heparin after rate change; no infusion issues or signs of bleeding per RN.  Goal of Therapy:  aPTT 66-102 seconds   Plan:  Will increase heparin infusion by 2 units/kg/hr to 1700 units/hr and check PTT in 8 hours.    Vernard Gambles, PharmD, BCPS  05/19/2022,5:46 AM

## 2022-05-19 NOTE — Hospital Course (Signed)
Albert Powell was admitted to the hospital with the working diagnosis of decompensated heart failure.   81/M with history of CAD, chronic diastolic CHF, COPD, type 2 diabetes mellitus, atrial fibrillation, hypertension presented to the ED with shortness of breath for 2 to 3 days. On the day of his hospitalization he became severely dyspneic and called EMS. His 02 saturation was 88%, heart rate was 130's with atrial fibrillation rhythm. He was placed on supplemental 02 per [place] and was transported to the ED. In the emergency room his blood pressure was 192/130, 206/101, HR 86, RR 21 and 02 saturation 94% on supplemental 02 per [place]. Lungs with diffuse rales bilaterally, with no wheezing, heart with S1 and S2 present, irregularly irregular, abdomen with no distention and no lower extremity edema.   Na 137, K 3,5 CL 105 bicarbonate 21, glucose 176 bun 12 cr 1,57  BNP >4,500  High sensitive troponin 96 and 85  Wbc 10,7 hgb 14.4 plt 264  Sars covid 19 negative   Chest radiograph with cardiomegaly, bilateral hilar vascular congestion, cephalization of the vasculature and bilateral interstitial infiltrates. Right pleural effusion.   EKG 122 bpm, normal axis, qtc 505, atrial fibrillation rhythm with no significant ST segment or T wave changes.   Patient was placed on furosemide for diuresis with good toleration.   Echocardiogram with severe reduction is LV systolic function with wall motion abnormalities.  09/05 cardiac catheterization with mild to moderate coronary artery disease.  PCWP 5 and mean RA pressure 5 mmHg.  Cardiac output 5,9 and index 2,7 per [place].

## 2022-05-19 NOTE — Assessment & Plan Note (Signed)
Calculated BMI is 31,8  

## 2022-05-19 NOTE — Care Management Important Message (Signed)
Important Message  Patient Details  Name: Albert Powell MRN: [id number] Date of Birth: Apr 28, 1941   Medicare Important Message Given:  Yes     [name] 05/19/2022, 8:44 AM

## 2022-05-19 NOTE — Assessment & Plan Note (Signed)
Continue with levothyroxine  

## 2022-05-20 ENCOUNTER — Encounter (HOSPITAL_COMMUNITY)
Admission: EM | Disposition: A | Discharge status: Home Health | Payer: Self-pay | Source: Home / Self Care | Attending: Internal Medicine

## 2022-05-20 DIAGNOSIS — E039 Hypothyroidism, unspecified: Secondary | ICD-10-CM | POA: Diagnosis not present

## 2022-05-20 DIAGNOSIS — I5041 Acute combined systolic (congestive) and diastolic (congestive) heart failure: Secondary | ICD-10-CM | POA: Diagnosis not present

## 2022-05-20 DIAGNOSIS — I420 Dilated cardiomyopathy: Secondary | ICD-10-CM | POA: Diagnosis not present

## 2022-05-20 DIAGNOSIS — I2510 Atherosclerotic heart disease of native coronary artery without angina pectoris: Secondary | ICD-10-CM | POA: Diagnosis not present

## 2022-05-20 HISTORY — PX: RIGHT/LEFT HEART CATH AND CORONARY ANGIOGRAPHY: CATH118266

## 2022-05-20 LAB — GLUCOSE, CAPILLARY
Glucose-Capillary: 106 mg/dL — ABNORMAL HIGH (ref 70–99)
Glucose-Capillary: 111 mg/dL — ABNORMAL HIGH (ref 70–99)
Glucose-Capillary: 102 mg/dL — ABNORMAL HIGH (ref 70–99)
Glucose-Capillary: 211 mg/dL — ABNORMAL HIGH (ref 70–99)

## 2022-05-20 LAB — POCT I-STAT EG7
Acid-Base Excess: 0 mmol/L (ref 0.0–2.0)
Acid-Base Excess: 1 mmol/L (ref 0.0–2.0)
Acid-base deficit: 1 mmol/L (ref 0.0–2.0)
Bicarbonate: 24.4 mmol/L (ref 20.0–28.0)
Bicarbonate: 23.8 mmol/L (ref 20.0–28.0)
Bicarbonate: 25.8 mmol/L (ref 20.0–28.0)
Calcium, Ion: 1.13 mmol/L — ABNORMAL LOW (ref 1.15–1.40)
Calcium, Ion: 1.04 mmol/L — ABNORMAL LOW (ref 1.15–1.40)
Calcium, Ion: 1.17 mmol/L (ref 1.15–1.40)
HCT: 37 % — ABNORMAL LOW (ref 39.0–52.0)
HCT: 36 % — ABNORMAL LOW (ref 39.0–52.0)
HCT: 37 % — ABNORMAL LOW (ref 39.0–52.0)
Hemoglobin: 12.6 g/dL — ABNORMAL LOW (ref 13.0–17.0)
Hemoglobin: 12.2 g/dL — ABNORMAL LOW (ref 13.0–17.0)
Hemoglobin: 12.6 g/dL — ABNORMAL LOW (ref 13.0–17.0)
O2 Saturation: 70 %
O2 Saturation: 72 %
O2 Saturation: 74 %
Potassium: 3.6 mmol/L (ref 3.5–5.1)
Potassium: 3.5 mmol/L (ref 3.5–5.1)
Potassium: 3.7 mmol/L (ref 3.5–5.1)
Sodium: 139 mmol/L (ref 135–145)
Sodium: 139 mmol/L (ref 135–145)
Sodium: 141 mmol/L (ref 135–145)
TCO2: 26 mmol/L (ref 22–32)
TCO2: 25 mmol/L (ref 22–32)
TCO2: 27 mmol/L (ref 22–32)
pCO2, Ven: 37.6 mmHg — ABNORMAL LOW (ref 44–60)
pCO2, Ven: 37 mmHg — ABNORMAL LOW (ref 44–60)
pCO2, Ven: 40.1 mmHg — ABNORMAL LOW (ref 44–60)
pH, Ven: 7.42 (ref 7.25–7.43)
pH, Ven: 7.416 (ref 7.25–7.43)
pH, Ven: 7.418 (ref 7.25–7.43)
pO2, Ven: 36 mmHg (ref 32–45)
pO2, Ven: 37 mmHg (ref 32–45)
pO2, Ven: 39 mmHg (ref 32–45)

## 2022-05-20 LAB — CBC
HCT: 46.4 % (ref 39.0–52.0)
Hemoglobin: 15.5 g/dL (ref 13.0–17.0)
MCH: 30.2 pg (ref 26.0–34.0)
MCHC: 33.4 g/dL (ref 30.0–36.0)
MCV: 90.4 fL (ref 80.0–100.0)
Platelets: 239 10*3/uL (ref 150–400)
RBC: 5.13 MIL/uL (ref 4.22–5.81)
RDW: 14.2 % (ref 11.5–15.5)
WBC: 9 10*3/uL (ref 4.0–10.5)
nRBC: 0 % (ref 0.0–0.2)

## 2022-05-20 LAB — POCT I-STAT 7, (LYTES, BLD GAS, ICA,H+H)
Acid-base deficit: 1 mmol/L (ref 0.0–2.0)
Bicarbonate: 22.2 mmol/L (ref 20.0–28.0)
Calcium, Ion: 1.13 mmol/L — ABNORMAL LOW (ref 1.15–1.40)
HCT: 37 % — ABNORMAL LOW (ref 39.0–52.0)
Hemoglobin: 12.6 g/dL — ABNORMAL LOW (ref 13.0–17.0)
O2 Saturation: 97 %
Potassium: 3.7 mmol/L (ref 3.5–5.1)
Sodium: 138 mmol/L (ref 135–145)
TCO2: 23 mmol/L (ref 22–32)
pCO2 arterial: 30.4 mmHg — ABNORMAL LOW (ref 32–48)
pH, Arterial: 7.471 — ABNORMAL HIGH (ref 7.35–7.45)
pO2, Arterial: 82 mmHg — ABNORMAL LOW (ref 83–108)

## 2022-05-20 LAB — BASIC METABOLIC PANEL
Anion gap: 13 (ref 5–15)
BUN: 20 mg/dL (ref 8–23)
CO2: 21 mmol/L — ABNORMAL LOW (ref 22–32)
Calcium: 9 mg/dL (ref 8.9–10.3)
Chloride: 103 mmol/L (ref 98–111)
Creatinine, Ser: 1.52 mg/dL — ABNORMAL HIGH (ref 0.61–1.24)
GFR, Estimated: 46 mL/min — ABNORMAL LOW (ref >60)
Glucose, Bld: 105 mg/dL — ABNORMAL HIGH (ref 70–99)
Potassium: 3.2 mmol/L — ABNORMAL LOW (ref 3.5–5.1)
Sodium: 137 mmol/L (ref 135–145)

## 2022-05-20 LAB — APTT
aPTT: 108 seconds — ABNORMAL HIGH (ref 24–36)
aPTT: >200 seconds (ref 24–36)

## 2022-05-20 LAB — HEPARIN LEVEL (UNFRACTIONATED): Heparin Unfractionated: 0.94 IU/mL — ABNORMAL HIGH (ref 0.30–0.70)

## 2022-05-20 SURGERY — RIGHT/LEFT HEART CATH AND CORONARY ANGIOGRAPHY
Anesthesia: LOCAL

## 2022-05-20 MED ORDER — HYDRALAZINE HCL 20 MG/ML IJ SOLN
10.0000 mg | INTRAMUSCULAR | Status: AC | PRN
Start: 2022-05-20 — End: 2022-05-20

## 2022-05-20 MED ORDER — LIDOCAINE HCL (PF) 1 % IJ SOLN
INTRAMUSCULAR | Status: AC
Start: 2022-05-20 — End: ?
  Filled 2022-05-20: qty 30

## 2022-05-20 MED ORDER — ACETAMINOPHEN 325 MG PO TABS
650.0000 mg | ORAL_TABLET | ORAL | Status: DC | PRN
  Administered 2022-05-20: 650 mg via ORAL
  Filled 2022-05-20: qty 2

## 2022-05-20 MED ORDER — POTASSIUM CHLORIDE 10 MEQ/100ML IV SOLN
10.0000 meq | Freq: Once | INTRAVENOUS | Status: AC
  Administered 2022-05-20: 10 meq via INTRAVENOUS
  Filled 2022-05-20: qty 100

## 2022-05-20 MED ORDER — HEPARIN SODIUM (PORCINE) 1000 UNIT/ML IJ SOLN
INTRAMUSCULAR | Status: AC
  Filled 2022-05-20: qty 10

## 2022-05-20 MED ORDER — SODIUM CHLORIDE 0.9% FLUSH: 3.0000 mL | INTRAVENOUS | Status: DC | PRN

## 2022-05-20 MED ORDER — HEPARIN SODIUM (PORCINE) 1000 UNIT/ML IJ SOLN
INTRAMUSCULAR | Status: DC | PRN
  Administered 2022-05-20: 5000 [IU] via INTRAVENOUS

## 2022-05-20 MED ORDER — CARVEDILOL 3.125 MG PO TABS: 3.1250 mg | ORAL_TABLET | Freq: Two times a day (BID) | ORAL | Status: DC

## 2022-05-20 MED ORDER — LABETALOL HCL 5 MG/ML IV SOLN: 10.0000 mg | INTRAVENOUS | Status: AC | PRN

## 2022-05-20 MED ORDER — VERAPAMIL HCL 2.5 MG/ML IV SOLN
INTRAVENOUS | Status: DC | PRN
  Administered 2022-05-20: 10 mL via INTRA_ARTERIAL

## 2022-05-20 MED ORDER — APIXABAN 2.5 MG PO TABS
2.5000 mg | ORAL_TABLET | Freq: Two times a day (BID) | ORAL | Status: DC
  Administered 2022-05-20 – 2022-05-21 (×2): 2.5 mg via ORAL
  Filled 2022-05-20 (×2): qty 1

## 2022-05-20 MED ORDER — HEPARIN (PORCINE) IN NACL 1000-0.9 UT/500ML-% IV SOLN
INTRAVENOUS | Status: DC | PRN
  Administered 2022-05-20 (×2): 500 mL

## 2022-05-20 MED ORDER — VERAPAMIL HCL 2.5 MG/ML IV SOLN
INTRAVENOUS | Status: AC
  Filled 2022-05-20: qty 2

## 2022-05-20 MED ORDER — METOPROLOL TARTRATE 12.5 MG HALF TABLET: 12.5000 mg | ORAL_TABLET | Freq: Two times a day (BID) | ORAL | Status: DC

## 2022-05-20 MED ORDER — SODIUM CHLORIDE 0.9% FLUSH
3.0000 mL | Freq: Two times a day (BID) | INTRAVENOUS | Status: DC
  Administered 2022-05-21: 3 mL via INTRAVENOUS

## 2022-05-20 MED ORDER — MIDAZOLAM HCL 2 MG/2ML IJ SOLN
INTRAMUSCULAR | Status: AC
  Filled 2022-05-20: qty 2

## 2022-05-20 MED ORDER — MIDAZOLAM HCL 2 MG/2ML IJ SOLN
INTRAMUSCULAR | Status: DC | PRN
  Administered 2022-05-20: 2 mg via INTRAVENOUS

## 2022-05-20 MED ORDER — HEPARIN (PORCINE) 25000 UT/250ML-% IV SOLN
1450.0000 [IU]/h | INTRAVENOUS | Status: DC
  Administered 2022-05-20: 1450 [IU]/h via INTRAVENOUS

## 2022-05-20 MED ORDER — CARVEDILOL 3.125 MG PO TABS
3.1250 mg | ORAL_TABLET | Freq: Two times a day (BID) | ORAL | Status: DC
  Administered 2022-05-20 – 2022-05-21 (×3): 3.125 mg via ORAL
  Filled 2022-05-20 (×3): qty 1

## 2022-05-20 MED ORDER — HEPARIN (PORCINE) 25000 UT/250ML-% IV SOLN: 1450.0000 [IU]/h | INTRAVENOUS | Status: DC

## 2022-05-20 MED ORDER — ONDANSETRON HCL 4 MG/2ML IJ SOLN: 4.0000 mg | Freq: Four times a day (QID) | INTRAMUSCULAR | Status: DC | PRN

## 2022-05-20 MED ORDER — SODIUM CHLORIDE 0.9 % IV SOLN: INTRAVENOUS | Status: AC

## 2022-05-20 MED ORDER — IOHEXOL 350 MG/ML SOLN
INTRAVENOUS | Status: DC | PRN
  Administered 2022-05-20: 25 mL

## 2022-05-20 MED ORDER — ESCITALOPRAM OXALATE 10 MG PO TABS
20.0000 mg | ORAL_TABLET | Freq: Every day | ORAL | Status: DC
  Administered 2022-05-21: 20 mg via ORAL
  Filled 2022-05-20: qty 2

## 2022-05-20 MED ORDER — POTASSIUM CHLORIDE 10 MEQ/100ML IV SOLN
10.0000 meq | INTRAVENOUS | Status: AC
  Administered 2022-05-20 (×2): 10 meq via INTRAVENOUS
  Filled 2022-05-20 (×2): qty 100

## 2022-05-20 MED ORDER — SACUBITRIL-VALSARTAN 97-103 MG PO TABS
1.0000 | ORAL_TABLET | Freq: Two times a day (BID) | ORAL | Status: DC
  Administered 2022-05-20 – 2022-05-21 (×2): 1 via ORAL
  Filled 2022-05-20 (×3): qty 1

## 2022-05-20 MED ORDER — CLOPIDOGREL BISULFATE 75 MG PO TABS
75.0000 mg | ORAL_TABLET | Freq: Every day | ORAL | Status: DC
  Administered 2022-05-21: 75 mg via ORAL
  Filled 2022-05-20: qty 1

## 2022-05-20 MED ORDER — HEPARIN (PORCINE) IN NACL 1000-0.9 UT/500ML-% IV SOLN
INTRAVENOUS | Status: AC
  Filled 2022-05-20: qty 500

## 2022-05-20 MED ORDER — SODIUM CHLORIDE 0.9 % IV SOLN: 250.0000 mL | INTRAVENOUS | Status: DC | PRN

## 2022-05-20 MED ORDER — HYDRALAZINE HCL 50 MG PO TABS
100.0000 mg | ORAL_TABLET | Freq: Three times a day (TID) | ORAL | Status: DC
  Administered 2022-05-20 – 2022-05-21 (×4): 100 mg via ORAL
  Filled 2022-05-20 (×4): qty 2

## 2022-05-20 MED ORDER — ISOSORBIDE MONONITRATE ER 60 MG PO TB24
120.0000 mg | ORAL_TABLET | Freq: Every day | ORAL | Status: DC
  Administered 2022-05-21: 120 mg via ORAL
  Filled 2022-05-20: qty 2

## 2022-05-20 MED ORDER — LIDOCAINE HCL (PF) 1 % IJ SOLN
INTRAMUSCULAR | Status: DC | PRN
  Administered 2022-05-20 (×2): 2 mL

## 2022-05-20 SURGICAL SUPPLY — 15 items
BAND CMPR LRG ZPHR (HEMOSTASIS) ×1
BAND ZEPHYR COMPRESS 30 LONG (HEMOSTASIS) IMPLANT
CATH BALLN WEDGE 5F 110CM (CATHETERS) IMPLANT
CATH INFINITI 5FR ANG PIGTAIL (CATHETERS) IMPLANT
CATH OPTITORQUE TIG 4.0 6F (CATHETERS) IMPLANT
GLIDESHEATH SLEND SS 6F .021 (SHEATH) IMPLANT
KIT HEART LEFT (KITS) ×1 IMPLANT
PACK CARDIAC CATHETERIZATION (CUSTOM PROCEDURE TRAY) ×1 IMPLANT
SHEATH PINNACLE 5F 10CM (SHEATH) IMPLANT
SHEATH PROBE COVER 6X72 (BAG) IMPLANT
SYR MEDRAD MARK 7 150ML (SYRINGE) ×1 IMPLANT
TRANSDUCER W/STOPCOCK (MISCELLANEOUS) ×1 IMPLANT
TUBING CIL FLEX 10 FLL-RA (TUBING) ×1 IMPLANT
WIRE EMERALD 3MM-J .035X260CM (WIRE) IMPLANT
WIRE MICRO SET 5FR 12 (WIRE) IMPLANT

## 2022-05-20 NOTE — Progress Notes (Signed)
ANTICOAGULATION CONSULT NOTE  Pharmacy Consult for heparin Indication: atrial fibrillation  Allergies  Allergen Reactions   Fentanyl Other (See Comments)    Behavioral changes   Gabapentin Other (See Comments)    ankle swells   Lisinopril Other (See Comments)    unknown   Lyrica [Pregabalin] Other (See Comments)    unknown   Metformin Diarrhea   Propofol Other (See Comments)    Heart rate dropped    Patient Measurements: Height: 5\' 10"  (177.8 cm) Weight: 100.9 kg (222 lb 8 oz) IBW/kg (Calculated) : 73 Heparin Dosing Weight: 93.8 kg  Vital Signs: Temp: 97.9 F (36.6 C) (09/05 0829) Temp Source: Oral (09/05 0829) BP: 164/150 (09/05 0900) Pulse Rate: 66 (09/05 0645)  Labs: Recent Labs    05/18/22 0231 05/18/22 1048 05/18/22 1815 05/19/22 0435 05/19/22 1343 05/19/22 2320 05/20/22 0757  HGB  --   --   --  13.7  --   --  15.5  HCT  --   --   --  40.5  --   --  46.4  PLT  --   --   --  245  --   --  239  APTT  --  34   < > 57* 59* 108* >200*  HEPARINUNFRC  --  1.00*  --  0.69  --  0.94*  --   CREATININE 1.78*  --   --  1.72*  --   --  1.52*   < > = values in this interval not displayed.     Estimated Creatinine Clearance: 46.2 mL/min (A) (by C-G formula based on SCr of 1.52 mg/dL (H)).   Medical History: Past Medical History:  Diagnosis Date   Atrial fibrillation ([name]) 03/30/2020   Bradycardia, sinus 02/18/2013   CAD (coronary artery disease)    stent to RCA 2003 also had 40% lesion at that time; NUCLEAR STRESS TEST, 01/16/2010 - normal  now with cath 80-90% stenosis in LAD, will try medical therapy if no improvement PCI   Carotid artery disease ([name])    Cataract    CHF (congestive heart failure) ([name])    Claudication ([name])    LEA DUPLEX, 07/07/2008 - Normal   COPD (chronic obstructive pulmonary disease) ([name])    DDD (degenerative disc disease) 2008   Diabetes mellitus    GERD (gastroesophageal reflux disease)    H/O hiatal hernia    History of  colonoscopy 2004   finding of tics and AMV only no polpys   Hyperlipidemia 02/05/2013   Hypertension    Onychomycosis    Rosacea    Tobacco abuse 02/05/2013    Medications:  Scheduled:   atorvastatin  40 mg Oral Daily   carvedilol  3.125 mg Oral BID WC   clopidogrel  75 mg Oral Daily   dapagliflozin propanediol  10 mg Oral Daily   dextromethorphan-guaiFENesin  1 tablet Oral BID   furosemide  40 mg Oral Daily   hydrALAZINE  100 mg Oral TID   insulin aspart  0-5 Units Subcutaneous QHS   insulin aspart  0-9 Units Subcutaneous TID WC   isosorbide mononitrate  60 mg Oral Daily   levothyroxine  25 mcg Oral Q0600   pantoprazole  40 mg Oral QODAY   sacubitril-valsartan  1 tablet Oral BID   sodium chloride flush  3 mL Intravenous Q12H   Assessment: 81 YO male presenting 8/29 with worsening shortness of breath, in afib RVR. LVEF now 25-30% from 50-55% in 3/23. Planning for heart  cath on Tuesday, 9/5. Patient has on apixaban 2.5mg  BID during admission due to age and poor renal function. Last dose on 9/2 @ 2050 and now held due to upcoming heart cath procedure. Pharmacy consulted for heparin dosing.   aPTT >200 after rate decrease to 1800 units/hr. RN verified with patient that lab was not drawn from arm heparin is infusing into. No bleeding or infusion issues noted. Plan for cath today.  Goal of Therapy:  Heparin level 0.3-0.7 units/ml aPTT 66-102 seconds Monitor platelets by anticoagulation protocol: Yes   Plan:  Hold heparin x 1 hour Decrease heparin infusion to 1450 units/hr aPTT/heparin level in 8 hr Monitor CBC, heparin level, aPTT, s/sx of bleeding daily F/u post cath   Thank you for allowing [place] to participate in this patients care. Signe Colt, PharmD 05/20/2022 10:13 AM  **Pharmacist phone directory can be found on [email or web address] listed under [facility]**

## 2022-05-20 NOTE — Progress Notes (Signed)
Critical lab reported that PTT is greater than 200 sec. Notified cardiology and pharmacy. The heparin drip will be stopped for now per pharmacist.

## 2022-05-20 NOTE — Progress Notes (Signed)
ANTICOAGULATION CONSULT NOTE - Follow Up Consult  Pharmacy Consult for heparin Indication:  ACS/Afib  Labs: Recent Labs    05/17/22 0259 05/18/22 0231 05/18/22 1048 05/18/22 1815 05/19/22 0435 05/19/22 1343 05/19/22 2320  HGB  --   --   --   --  13.7  --   --   HCT  --   --   --   --  40.5  --   --   PLT  --   --   --   --  245  --   --   APTT  --   --  34   < > 57* 59* 108*  HEPARINUNFRC  --   --  1.00*  --  0.69  --  0.94*  CREATININE 1.71* 1.78*  --   --  1.72*  --   --    < > = values in this interval not displayed.     Assessment: 81yo male now slightly supratherapeutic on heparin after rate change; no infusion issues or signs of bleeding per RN.  Goal of Therapy:  aPTT 66-102 seconds   Plan:  Will decrease heparin infusion by 1 unit/kg/hr to 1800 units/hr and check PTT in 8 hours.    Vernard Gambles, PharmD, BCPS  05/20/2022,12:27 AM

## 2022-05-20 NOTE — Progress Notes (Signed)
   05/20/22 0645  Vitals  BP (!) 187/79  MAP (mmHg) 106  Pulse Rate 66  ECG Heart Rate 70  MEWS COLOR  MEWS Score Color Green  Oxygen Therapy  SpO2 99 %  MEWS Score  MEWS Temp 0  MEWS Systolic 0  MEWS Pulse 0  MEWS RR 0  MEWS LOC 0  MEWS Score 0

## 2022-05-20 NOTE — Progress Notes (Signed)
Progress Note   Patient: Albert Powell MPN:[id number] DOB: [birth year] DOA: 05/13/2022     7 DOS: the patient was seen and examined on 05/20/2022   Brief hospital course: 81/M with history of CAD, chronic diastolic CHF, COPD, type 2 diabetes mellitus, atrial fibrillation, hypertension presented to the ED with shortness of breath for 2 to 3 days, hypoxic in the ER, noted to be in A-fib RVR, systolic blood pressure in the 240 range.  Work-up noted creatinine of 1.57, troponin of 96, BNP 4500, chest x-ray noted bibasilar infiltrates, possibly multifocal pneumonia. -Improving with diuretics, echo noted drop in EF to 25-30% -Cards consulting, plan for left heart cath on Tuesday 9/5  Assessment and Plan: * Acute combined systolic and diastolic heart failure ([name]) Echocardiogram with reduced LV systolic function 25 to 30%, severe hypokinesis of the left ventricular basal mid inferospetal wall, inferior wall and antero lateral wall.  -Cardiac cath today  -Blood pressure still remains uncontrolled, increase hydralazine to 100 mg 3 times daily, change metoprolol to Coreg, continue Imdur, continue Entresto, following kidney function.  Continue p.o. Lasix  Atrial fibrillation with RVR ([name]) Mostly rate controlled, change metoprolol to Coreg for blood pressure usual   Chronic kidney disease, stage 3a ([name]) Euvolemic, continue p.o. Lasix -Daily BMP  Acquired hypothyroidism Continue with levothyroxine./   Hypertensive emergency Modified blood pressure medication regimen as above.  Coronary artery disease involving native coronary artery of native heart without angina pectoris Worsening LV wall motion abnormalities Cardiac catheterization Continue anticoagulation with heparin  Antiplatelet therapy with clopidogrel.    GERD (gastroesophageal reflux disease) Continue with antiacid therapy.   Mixed hyperlipidemia Continue with statin therapy.   Obesity (BMI 30-39.9)- negative sleep study in  the past Calculated BMI is 31,8   Type 2 diabetes mellitus with hyperlipidemia ([name]) Continue glucose cover and monitoring with insulin sliding scale Glucose has been stable, fasting this am 115 mg/dl Patient is tolerating po well.         Subjective:   ,Subjective:  Eyes: PERRL, lids and conjunctivae normal ENMT: Mucous membranes are moist. Posterior pharynx clear of any exudate or lesions.Normal dentition.  Neck: normal, supple, no masses, no thyromegaly Respiratory: clear to auscultation bilaterally, no wheezing, no crackles. Normal respiratory effort. No accessory muscle use.  Cardiovascular: Regular rate and rhythm, no murmurs / rubs / gallops. No extremity edema. 2+ pedal pulses. No carotid bruits.  Abdomen: no tenderness, no masses palpated. No hepatosplenomegaly. Bowel sounds positive.  Musculoskeletal: no clubbing / cyanosis. No joint deformity upper and lower extremities. Good ROM, no contractures. Normal muscle tone.  Skin: no rashes, lesions, ulcers. No induration Neurologic: CN 2-12 grossly intact. Sensation intact, DTR normal.  Slight weakness on left side compared to right side Psychiatric: Normal judgment and insight. Alert and oriented x 3. Normal mood.    Physical Exam: Vitals:   05/20/22 0615 05/20/22 0645 05/20/22 0829 05/20/22 0900  BP: (!) 181/73 (!) 187/79 (!) 179/98 (!) 164/150  Pulse: 64 66    Resp: 17     Temp: 98.4 F (36.9 C)  97.9 F (36.6 C)   TempSrc: Oral  Oral   SpO2: 98% 99% 100%   Weight:      Height:        Data Reviewed: BMP and echo showed purulent   Family Communication: None at bedside  Disposition: Status is: Inpatient Remains inpatient appropriate because: Investigation for worsening of LVEF, requiring inpatient  Planned Discharge Destination: Home with Home Health  Time spent:  38 minutes  Author: Emeline General, MD 05/20/2022 12:49 PM  For on call review [email or web address].

## 2022-05-20 NOTE — Progress Notes (Signed)
   05/20/22 0520  Vitals  Temp 98.4 F (36.9 C)  Temp Source Oral  BP (!) 201/86  MAP (mmHg) 116  Pulse Rate 63  ECG Heart Rate 69  Resp 18  Level of Consciousness  Level of Consciousness Alert  MEWS COLOR  MEWS Score Color Yellow  Oxygen Therapy  SpO2 96 %  O2 Device Room Air  MEWS Score  MEWS Temp 0  MEWS Systolic 2  MEWS Pulse 0  MEWS RR 0  MEWS LOC 0  MEWS Score 2   Pt's blood pressure elevated, pt resting in bed asymptomatic, prn 10mg  IV hydralazine given. Will recheck BP in 15 mins.

## 2022-05-20 NOTE — Progress Notes (Signed)
Occupational Therapy Treatment Patient Details Name: Albert Powell MRN: [id number] DOB: May 13, 1941 Today's Date: 05/20/2022   History of present illness Patient is a 81 yo male presenting to the ED with increased SOB and HR in 130s on 05/13/22. Admitted with CHF exacerbation same day. PMH includes: CAD, chronic diastolic CHF, essential hypertension, hyperlipidemia, A-fib, COPD and T2DM   OT comments  Patient received in supine and agreeable to OT session. Patient able to get to EOB without assistance and HOB raised. Patient Performed grooming and LB dressing with setup for grooming and supervision to change socks. Patient performed standing from EOB and tolerated 1.5 minutes of standing. Reviewed energy conservation strategies with patient unable to recall strategies before review.  Patient to continue to be followed by acute OT.    Recommendations for follow up therapy are one component of a multi-disciplinary discharge planning process, led by the attending physician.  Recommendations may be updated based on patient status, additional functional criteria and insurance authorization.    Follow Up Recommendations  Home health OT    Assistance Recommended at Discharge Intermittent Supervision/Assistance  Patient can return home with the following  A little help with walking and/or transfers;A little help with bathing/dressing/bathroom;Assist for transportation;Help with stairs or ramp for entrance   Equipment Recommendations  None recommended by OT    Recommendations for Other Services      Precautions / Restrictions Precautions Precautions: Fall Precaution Comments: watch HR and O2 Restrictions Weight Bearing Restrictions: No       Mobility Bed Mobility Overal bed mobility: Modified Independent Bed Mobility: Supine to Sit, Sit to Supine     Supine to sit: Modified independent (Device/Increase time) Sit to supine: Modified independent (Device/Increase time)   General bed  mobility comments: HOB raised and increased time    Transfers Overall transfer level: Needs assistance Equipment used: Rolling walker (2 wheels) Transfers: Sit to/from Stand Sit to Stand: Min guard           General transfer comment: cues for hand placement     Balance Overall balance assessment: Needs assistance Sitting-balance support: Single extremity supported, Feet supported Sitting balance-Leahy Scale: Fair Sitting balance - Comments: able to perform LB dressing seated on EOB without assistance   Standing balance support: Single extremity supported, During functional activity Standing balance-Leahy Scale: Fair Standing balance comment: reliant on one extremity support when standing                           ADL either performed or assessed with clinical judgement   ADL Overall ADL's : Needs assistance/impaired     Grooming: Wash/dry hands;Wash/dry face;Brushing hair;Set up;Sitting Grooming Details (indicate cue type and reason): on EOB             Lower Body Dressing: Supervision/safety;Sitting/lateral leans Lower Body Dressing Details (indicate cue type and reason): changed socks seated on EOB               General ADL Comments: declined standing at sink.  Stood from EOB to simulate standing at sink with min guard assist    Extremity/Trunk Assessment              Vision       Perception     Praxis      Cognition Arousal/Alertness: Awake/alert Behavior During Therapy: WFL for tasks assessed/performed Overall Cognitive Status: Within Functional Limits for tasks assessed  Exercises      Shoulder Instructions       General Comments BP in supine 180/45, EOB 196/86, 185/87 standing    Pertinent Vitals/ Pain       Pain Assessment Pain Assessment: No/denies pain  Home Living                                          Prior Functioning/Environment               Frequency  Min 2X/week        Progress Toward Goals  OT Goals(current goals can now be found in the care plan section)  Progress towards OT goals: Progressing toward goals  Acute Rehab OT Goals Patient Stated Goal: get better OT Goal Formulation: With patient Time For Goal Achievement: 05/28/22 Potential to Achieve Goals: Good ADL Goals Pt Will Perform Lower Body Bathing: Independently;sitting/lateral leans;sit to/from stand Pt Will Perform Lower Body Dressing: Independently;sitting/lateral leans;sit to/from stand Pt Will Transfer to Toilet: Independently;ambulating;regular height toilet Pt Will Perform Toileting - Clothing Manipulation and hygiene: Independently Additional ADL Goal #1: Patient will demonstrate increased activity tolerance to complete functional task in standing for 3-5 minutes without needing seated rest break. Additional ADL Goal #2: Patient will verbalize 3 strategies for managing CHF exacerbation to prevent future admissions.  Plan Discharge plan remains appropriate    Co-evaluation                 AM-PAC OT "6 Clicks" Daily Activity     Outcome Measure   Help from another person eating meals?: A Little Help from another person taking care of personal grooming?: A Little Help from another person toileting, which includes using toliet, bedpan, or urinal?: A Little Help from another person bathing (including washing, rinsing, drying)?: A Little Help from another person to put on and taking off regular upper body clothing?: A Little Help from another person to put on and taking off regular lower body clothing?: A Little 6 Click Score: 18    End of Session Equipment Utilized During Treatment: Rolling walker (2 wheels)  OT Visit Diagnosis: Unsteadiness on feet (R26.81);Other abnormalities of gait and mobility (R26.89);Muscle weakness (generalized) (M62.81)   Activity Tolerance Patient tolerated treatment well   Patient Left in  bed;with call bell/phone within reach;with bed alarm set   Nurse Communication Mobility status        Time: 9381-8299 OT Time Calculation (min): 26 min  Charges: OT General Charges $OT Visit: 1 Visit OT Treatments $Self Care/Home Management : 23-37 mins  Alfonse Flavors, OTA Acute Rehabilitation Services  Office [phone]   Dewain Penning 05/20/2022, 8:48 AM

## 2022-05-20 NOTE — Progress Notes (Signed)
Rounding Note    Patient Name: Albert Powell Date of Encounter: 05/20/2022  [place] Cardiologist: Quay Burow, MD   Subjective  No chest pain or dyspnea   Inpatient Medications    Scheduled Meds:  atorvastatin  40 mg Oral Daily   carvedilol  3.125 mg Oral BID WC   clopidogrel  75 mg Oral Daily   dapagliflozin propanediol  10 mg Oral Daily   dextromethorphan-guaiFENesin  1 tablet Oral BID   furosemide  40 mg Oral Daily   hydrALAZINE  100 mg Oral TID   insulin aspart  0-5 Units Subcutaneous QHS   insulin aspart  0-9 Units Subcutaneous TID WC   isosorbide mononitrate  60 mg Oral Daily   levothyroxine  25 mcg Oral Q0600   pantoprazole  40 mg Oral QODAY   sacubitril-valsartan  1 tablet Oral BID   sodium chloride flush  3 mL Intravenous Q12H   Continuous Infusions:  sodium chloride     sodium chloride 50 mL/hr at 05/20/22 0533   heparin 1,800 Units/hr (05/20/22 0028)   PRN Meds: sodium chloride, acetaminophen **OR** acetaminophen, guaiFENesin-dextromethorphan, hydrALAZINE, sodium chloride flush   Vital Signs    Vitals:   05/20/22 0530 05/20/22 0615 05/20/22 0645 05/20/22 0829  BP: (!) 190/72 (!) 181/73 (!) 187/79 (!) 179/98  Pulse: 69 64 66   Resp:  17    Temp:  98.4 F (36.9 C)  97.9 F (36.6 C)  TempSrc:  Oral  Oral  SpO2: 97% 98% 99% 100%  Weight:      Height:        Intake/Output Summary (Last 24 hours) at 05/20/2022 0945 Last data filed at 05/20/2022 [id number] Gross per 24 hour  Intake 360 ml  Output 1500 ml  Net -1140 ml      05/20/2022    4:33 AM 05/19/2022    6:41 AM 05/18/2022   10:00 AM  Last 3 Weights  Weight (lbs) 222 lb 8 oz 221 lb 11.2 oz 220 lb 0.3 oz  Weight (kg) 100.925 kg 100.562 kg 99.8 kg      Telemetry    SR first deg AVB, PACs - Personally Reviewed  ECG    SR first deg AVB, PACs, inf infarct pattern - Personally Reviewed  Physical Exam   GEN: Well nourished, well developed in no acute distress NECK: No  JVD CARDIAC: regular rhythm, normal S1 and S2, no rubs or gallops. No murmur. VASCULAR: Radial pulses 2+ bilaterally.  RESPIRATORY:  Clear to auscultation without rales, wheezing or rhonchi  ABDOMEN: Soft, non-tender, non-distended MUSCULOSKELETAL:  Moves all 4 limbs independently SKIN: Warm and dry, no edema NEUROLOGIC:  No focal neuro deficits noted. PSYCHIATRIC:  Normal affect    Labs    High Sensitivity Troponin:   Recent Labs  Lab 05/13/22 1627 05/13/22 2131  TROPONINIHS 96* 85*     Chemistry Recent Labs  Lab 05/13/22 1627 05/13/22 1639 05/14/22 0647 05/15/22 0332 05/18/22 0231 05/19/22 0435 05/20/22 0757  NA 137   < > 136   < > 138 138 137  K 3.5   < > 2.8*   < > 3.6 4.0 3.2*  CL 105   < > 98   < > 101 104 103  CO2 21*  --  24   < > 30 26 21*  GLUCOSE 176*   < > 175*   < > 123* 115* 105*  BUN 12   < > 10   < >  22 22 20   CREATININE 1.57*   < > 1.53*   < > 1.78* 1.72* 1.52*  CALCIUM 9.2  --  8.4*   < > 8.7* 8.9 9.0  MG 2.0  --  1.9  --   --   --   --   PROT 6.5  --  5.7*  --   --   --   --   ALBUMIN 3.5  --  3.1*  --   --   --   --   AST 14*  --  16  --   --   --   --   ALT 14  --  16  --   --   --   --   ALKPHOS 57  --  48  --   --   --   --   BILITOT 1.0  --  1.2  --   --   --   --   GFRNONAA 44*  --  46*   < > 38* 40* 46*  ANIONGAP 11  --  14   < > 7 8 13    < > = values in this interval not displayed.    Lipids  Recent Labs  Lab 05/16/22 0529  CHOL 136  TRIG 115  HDL 43  LDLCALC 70  CHOLHDL 3.2    Hematology Recent Labs  Lab 05/16/22 0529 05/19/22 0435 05/20/22 0757  WBC 9.8 8.0 9.0  RBC 4.59 4.49 5.13  HGB 13.7 13.7 15.5  HCT 41.8 40.5 46.4  MCV 91.1 90.2 90.4  MCH 29.8 30.5 30.2  MCHC 32.8 33.8 33.4  RDW 14.0 14.1 14.2  PLT 233 245 239   Thyroid No results for input(s): "TSH", "FREET4" in the last 168 hours.  BNP Recent Labs  Lab 05/13/22 1627  BNP >4,500.0*    DDimer No results for input(s): "DDIMER" in the last 168 hours.    Radiology    No results found.   Cardiac Studies   Echo as above Patient Profile     81 y.o. male with PMH of CAD, PAD, chronic diastolic heart failure, paroxysmal atrial fibrillation, and hypertension who presented with several days of shortness of breath, found to have HFrEF with decompensated HF.  Assessment & Plan    Principal Problem:   Acute combined systolic and diastolic heart failure ([name]) Active Problems:   Coronary artery disease involving native coronary artery of native heart without angina pectoris   Mixed hyperlipidemia   Obesity (BMI 30-39.9)- negative sleep study in the past   Atrial fibrillation with RVR ([name])   Hypertensive emergency   Chronic kidney disease, stage 3a ([name])   GERD (gastroesophageal reflux disease)   Acquired hypothyroidism   Type 2 diabetes mellitus with hyperlipidemia ([name])  Acute systolic and diastolic heart failure (previously chronic diastolic heart failure) Chronic kidney disease stage 3a -echo shows new reduction in systolic function -improving volume status, near euvolemic. Primary team transitioned to lasix 40 mg po daily.  -restarted lower dose entresto for afterload reduction/hypertension -renal function stable, abnl -strict I/O, ordered fluid restriction, daily weights -continue farxiga, metoprolol (change to succinate prior to discharge) -holding spironolactone -imdur 60 mg daily.  - anticipate cath Tuesday sept 5. NPO at midnight.   Reduced EF Known CAD with prior RCA stent Known PAD -Echo with EF 60-65% -> 50-55%, now 25-30% with wall motion abnormalities. -given known CAD, plan for right/left cath today on board orders written  -continue clopidogrel -Atorvastatin 40 mg was  on his outside med list but he reports not taking. Started this admission.   Atrial fibrillation, persistent vs paroxysmal -CHA2DS2/VASC 6 -in SR -on heparin - resume eliquis post cath    Hypertension -have restarted entresto at a lower dose  (due to concerns for kidney function).  -hydralazine and imdur increased.    For questions or updates, please contact [facility] Please consult [email or web address] for contact info under        Signed, Charlton Haws, MD  05/20/2022, 9:45 AM

## 2022-05-20 NOTE — Progress Notes (Signed)
Physical Therapy Treatment Patient Details Name: Albert Powell MRN: [id number] DOB: October 15, 1940 Today's Date: 05/20/2022   History of Present Illness Patient is a 81 yo male presenting to the ED with increased SOB and HR in 130s on 05/13/22. Admitted with CHF exacerbation same day. PMH includes: CAD, chronic diastolic CHF, essential hypertension, hyperlipidemia, A-fib, COPD and T2DM    PT Comments    Pt somewhat anxious about impending procedure later today, needed encouragement to mobilize. Also relays that his wife has recently been diagnosed with dementia and he doesn't know if she can stay at home. Daughter is helping him with this. Pt ambulated 150' with RW and min guard A. Notable uneven gait which pt says is due to 1/2" LLD he has had his whole life. Discussed heel lift for shoe if back or hips bother him with this. BP after ambulation 128/92, HR 65 bpm. PT will continue to follow.    Recommendations for follow up therapy are one component of a multi-disciplinary discharge planning process, led by the attending physician.  Recommendations may be updated based on patient status, additional functional criteria and insurance authorization.  Follow Up Recommendations  Home health PT     Assistance Recommended at Discharge Intermittent Supervision/Assistance  Patient can return home with the following A little help with walking and/or transfers;A little help with bathing/dressing/bathroom;Assistance with feeding;Assist for transportation;Help with stairs or ramp for entrance   Equipment Recommendations  None recommended by PT    Recommendations for Other Services       Precautions / Restrictions Precautions Precautions: Fall Precaution Comments: watch HR and O2 Restrictions Weight Bearing Restrictions: No     Mobility  Bed Mobility Overal bed mobility: Modified Independent Bed Mobility: Supine to Sit, Sit to Supine     Supine to sit: Modified independent (Device/Increase  time) Sit to supine: Modified independent (Device/Increase time)   General bed mobility comments: HOB raised and increased time    Transfers Overall transfer level: Needs assistance Equipment used: Rolling walker (2 wheels) Transfers: Sit to/from Stand Sit to Stand: Min guard           General transfer comment: cues for hand placement    Ambulation/Gait Ambulation/Gait assistance: Min guard Gait Distance (Feet): 150 Feet Assistive device: Rolling walker (2 wheels) Gait Pattern/deviations: Step-through pattern Gait velocity: decreased Gait velocity interpretation: 1.31 - 2.62 ft/sec, indicative of limited community ambulator   General Gait Details: uneven gait due to 1/2" LLD, pt reports it has never given him problems but discussed the possibility of heel lift he has back or hip pain. min guard A for safety   Stairs             Wheelchair Mobility    Modified Rankin (Stroke Patients Only)       Balance Overall balance assessment: Needs assistance Sitting-balance support: Single extremity supported, Feet supported Sitting balance-Leahy Scale: Good     Standing balance support: Single extremity supported, During functional activity Standing balance-Leahy Scale: Fair Standing balance comment: reliant on one extremity support when standing                            Cognition Arousal/Alertness: Awake/alert Behavior During Therapy: WFL for tasks assessed/performed Overall Cognitive Status: Within Functional Limits for tasks assessed  Exercises      General Comments General comments (skin integrity, edema, etc.): BP 128/92 after ambulation, HR 65 bpm      Pertinent Vitals/Pain Pain Assessment Pain Assessment: No/denies pain    Home Living                          Prior Function            PT Goals (current goals can now be found in the care plan section) Acute  Rehab PT Goals Patient Stated Goal: back home PT Goal Formulation: With patient Time For Goal Achievement: 05/28/22 Potential to Achieve Goals: Good Progress towards PT goals: Progressing toward goals    Frequency    Min 3X/week      PT Plan Current plan remains appropriate    Co-evaluation              AM-PAC PT "6 Clicks" Mobility   Outcome Measure  Help needed turning from your back to your side while in a flat bed without using bedrails?: A Little Help needed moving from lying on your back to sitting on the side of a flat bed without using bedrails?: A Little Help needed moving to and from a bed to a chair (including a wheelchair)?: A Little Help needed standing up from a chair using your arms (e.g., wheelchair or bedside chair)?: A Little Help needed to walk in hospital room?: A Little Help needed climbing 3-5 steps with a railing? : A Little 6 Click Score: 18    End of Session Equipment Utilized During Treatment: Gait belt Activity Tolerance: Patient tolerated treatment well Patient left: in bed;with call bell/phone within reach;with bed alarm set Nurse Communication: Mobility status PT Visit Diagnosis: Unsteadiness on feet (R26.81);Difficulty in walking, not elsewhere classified (R26.2)     Time: 3818-2993 PT Time Calculation (min) (ACUTE ONLY): 16 min  Charges:  $Gait Training: 8-22 mins                     Lyanne Co, PT  Acute Rehab Services Secure chat preferred Office [phone]    Lawana Chambers [facility] 05/20/2022, 12:19 PM

## 2022-05-20 NOTE — Progress Notes (Signed)
   05/20/22 0530  Vitals  BP (!) 190/72  MAP (mmHg) 108  Pulse Rate 69  ECG Heart Rate 71  MEWS COLOR  MEWS Score Color Green  Oxygen Therapy  SpO2 97 %  MEWS Score  MEWS Temp 0  MEWS Systolic 0  MEWS Pulse 0  MEWS RR 0  MEWS LOC 0  MEWS Score 0   BP after IV hydralazine. Will continue to monitor.

## 2022-05-21 ENCOUNTER — Other Ambulatory Visit (HOSPITAL_COMMUNITY): Payer: Self-pay

## 2022-05-21 ENCOUNTER — Encounter (HOSPITAL_COMMUNITY): Payer: Self-pay | Admitting: Internal Medicine

## 2022-05-21 ENCOUNTER — Inpatient Hospital Stay (HOSPITAL_COMMUNITY): Payer: Medicare Other

## 2022-05-21 DIAGNOSIS — I2510 Atherosclerotic heart disease of native coronary artery without angina pectoris: Secondary | ICD-10-CM | POA: Diagnosis not present

## 2022-05-21 DIAGNOSIS — N179 Acute kidney failure, unspecified: Secondary | ICD-10-CM | POA: Diagnosis not present

## 2022-05-21 DIAGNOSIS — E039 Hypothyroidism, unspecified: Secondary | ICD-10-CM | POA: Diagnosis not present

## 2022-05-21 DIAGNOSIS — I4891 Unspecified atrial fibrillation: Secondary | ICD-10-CM | POA: Diagnosis not present

## 2022-05-21 DIAGNOSIS — N189 Chronic kidney disease, unspecified: Secondary | ICD-10-CM

## 2022-05-21 DIAGNOSIS — I5041 Acute combined systolic (congestive) and diastolic (congestive) heart failure: Secondary | ICD-10-CM | POA: Diagnosis not present

## 2022-05-21 DIAGNOSIS — I2699 Other pulmonary embolism without acute cor pulmonale: Secondary | ICD-10-CM

## 2022-05-21 LAB — BASIC METABOLIC PANEL
Anion gap: 9 (ref 5–15)
BUN: 16 mg/dL (ref 8–23)
CO2: 22 mmol/L (ref 22–32)
Calcium: 8.9 mg/dL (ref 8.9–10.3)
Chloride: 106 mmol/L (ref 98–111)
Creatinine, Ser: 1.62 mg/dL — ABNORMAL HIGH (ref 0.61–1.24)
GFR, Estimated: 43 mL/min — ABNORMAL LOW (ref >60)
Glucose, Bld: 178 mg/dL — ABNORMAL HIGH (ref 70–99)
Potassium: 3.9 mmol/L (ref 3.5–5.1)
Sodium: 137 mmol/L (ref 135–145)

## 2022-05-21 LAB — GLUCOSE, CAPILLARY
Glucose-Capillary: 111 mg/dL — ABNORMAL HIGH (ref 70–99)
Glucose-Capillary: 171 mg/dL — ABNORMAL HIGH (ref 70–99)

## 2022-05-21 LAB — CBC
HCT: 40.6 % (ref 39.0–52.0)
Hemoglobin: 13.3 g/dL (ref 13.0–17.0)
MCH: 30.3 pg (ref 26.0–34.0)
MCHC: 32.8 g/dL (ref 30.0–36.0)
MCV: 92.5 fL (ref 80.0–100.0)
Platelets: 251 10*3/uL (ref 150–400)
RBC: 4.39 MIL/uL (ref 4.22–5.81)
RDW: 14.4 % (ref 11.5–15.5)
WBC: 7.5 10*3/uL (ref 4.0–10.5)
nRBC: 0 % (ref 0.0–0.2)

## 2022-05-21 MED ORDER — HYDRALAZINE HCL 100 MG PO TABS
100.0000 mg | ORAL_TABLET | Freq: Three times a day (TID) | ORAL | 0 refills | Status: DC
Start: 2022-05-21 — End: 2022-07-03
  Filled 2022-05-21: qty 90, 30d supply, fill #0

## 2022-05-21 MED ORDER — FUROSEMIDE 40 MG PO TABS
40.0000 mg | ORAL_TABLET | Freq: Every day | ORAL | 0 refills | Status: DC
  Filled 2022-05-21: qty 30, 30d supply, fill #0

## 2022-05-21 MED ORDER — ISOSORBIDE MONONITRATE ER 60 MG PO TB24
120.0000 mg | ORAL_TABLET | Freq: Every day | ORAL | 0 refills | Status: DC
  Filled 2022-05-21: qty 60, 30d supply, fill #0

## 2022-05-21 MED ORDER — ATORVASTATIN CALCIUM 40 MG PO TABS
40.0000 mg | ORAL_TABLET | Freq: Every day | ORAL | 0 refills | Status: DC
  Filled 2022-05-21: qty 30, 30d supply, fill #0

## 2022-05-21 MED ORDER — CARVEDILOL 3.125 MG PO TABS
3.1250 mg | ORAL_TABLET | Freq: Two times a day (BID) | ORAL | 0 refills | Status: DC
  Filled 2022-05-21: qty 60, 30d supply, fill #0

## 2022-05-21 NOTE — Discharge Summary (Signed)
Physician Discharge Summary   Patient: Albert Powell MRN: [id number] DOB: May 30, 1941  Admit date:     05/13/2022  Discharge date: 05/21/22  Discharge Physician: York Ram Avaline Stillson   PCP: [name], MD   Recommendations at discharge:    Patient will continue diuresis with furosemide, spironolactone and empagliflozin Hydralazine has been added and dose of isosorbide has been increased.  Follow up renal function as outpatient.  Follow up with Dr Chales Salmon in 7 days and with Dr Allyson Sabal as scheduled.   Discharge Diagnoses: Principal Problem:   Acute combined systolic and diastolic heart failure ([name]) Active Problems:   Atrial fibrillation with RVR ([name])   Acute kidney injury superimposed on chronic kidney disease ([name])   Hypertensive emergency   Acquired hypothyroidism   Coronary artery disease involving native coronary artery of native heart without angina pectoris   Type 2 diabetes mellitus with hyperlipidemia ([name])   GERD (gastroesophageal reflux disease)   Obesity (BMI 30-39.9)- negative sleep study in the past  Resolved Problems:   * No resolved hospital problems. [facility] Course: Albert Powell was admitted to the hospital with the working diagnosis of decompensated heart failure.   81/M with history of CAD, chronic diastolic CHF, COPD, type 2 diabetes mellitus, atrial fibrillation, hypertension presented to the ED with shortness of breath for 2 to 3 days. On the day of his hospitalization he became severely dyspneic and called EMS. His 02 saturation was 88%, heart rate was 130's with atrial fibrillation rhythm. He was placed on supplemental 02 per [place] and was transported to the ED. In the emergency room his blood pressure was 192/130, 206/101, HR 86, RR 21 and 02 saturation 94% on supplemental 02 per [place]. Lungs with diffuse rales bilaterally, with no wheezing, heart with S1 and S2 present, irregularly irregular, abdomen with no distention and no lower extremity  edema.   Na 137, K 3,5 CL 105 bicarbonate 21, glucose 176 bun 12 cr 1,57  BNP >4,500  High sensitive troponin 96 and 85  Wbc 10,7 hgb 14.4 plt 264  Sars covid 19 negative   Chest radiograph with cardiomegaly, bilateral hilar vascular congestion, cephalization of the vasculature and bilateral interstitial infiltrates. Right pleural effusion.   EKG 122 bpm, normal axis, qtc 505, atrial fibrillation rhythm with no significant ST segment or T wave changes.   Patient was placed on furosemide for diuresis with good toleration.   Echocardiogram with severe reduction is LV systolic function with wall motion abnormalities.  09/05 cardiac catheterization with mild to moderate coronary artery disease.  PCWP 5 and mean RA pressure 5 mmHg.  Cardiac output 5,9 and index 2,7 per [place].    Assessment and Plan: * Acute combined systolic and diastolic heart failure ([name]) Echocardiogram with reduced LV systolic function 25 to 30%, RV systolic function preserved.  Severe hypokinesis of the left ventricular basal mid inferospetal wall, inferior wall and antero lateral wall. Mild hypokinesis of the left ventricular entire anteroseptal wall, anterior wall and apical segment.  Worsening wall motion abnormalities.  Patient was placed on furosemide for diuresis, negative fluid balance was achieved, -9,073 ml, with significant improvement in his symptoms.   09/05 cardiac catheterization with improved filling pressures with PCWP of 5.  He was transitioned to po furosemide and continue with guideline directed therapy for heart failure.   Added hydralazine and increased dose of isosorbide, Continue with SGLT 2 inh, metoprolol and entresto.   Atrial fibrillation with RVR ([name]) Heart rate improved with AV blockade.  At the time of his discharge his heart rate is 60 to 70, continue with atrial fibrillation rhythm.  Plan to continue anticoagulation with apixaban.    Acute kidney injury superimposed on chronic  kidney disease ([name]) Patient was placed on furosemide for diuresis, blood pressure control with hydralazine and isosorbide. Peak serum cr at 1,79.   At the time of his discharge his renal function has improved, his serum cr is 1.62, with K at 3,9 and serum bicarbonate at 22.   Continue diuresis with furosemide, spironolactone and empagliflozin.   Acquired hypothyroidism Continue with levothyroxine./   Hypertensive emergency Patient had aggressive diuresis with furosemide Antihypertensive regimen has been adjusted, he will continue taking hydralazine, isosorbide, carvedilol and diuretic therapy with spironolactone, SGLT 2 inh and furosemide Follow up as outpatient.   Coronary artery disease involving native coronary artery of native heart without angina pectoris Worsening LV wall motion abnormalities Patient was placed transitory with IV heparin and underwent cardiac catheterization with coronary angiography.   Mild to moderate obstructive coronary artery disease.   Recommendations to continue antiplatelet therapy with clopidogrel. Continue with statin.  At the time of discharge patient has been chest pain free.   Type 2 diabetes mellitus with hyperlipidemia ([name]) His glucose remained stable during his hospitalization, he was placed on insulin sliding scale.  Resume glipizide at discharge.  Plant continue statin therapy at the time of his discharge.   GERD (gastroesophageal reflux disease) Continue with antiacid therapy.   Obesity (BMI 30-39.9)- negative sleep study in the past Calculated BMI is 31,8          Consultants: cardiology  Procedures performed: cardiac catheterization    Disposition: Home Diet recommendation:  Discharge Diet Orders (From admission, onward)     Start     Ordered   05/21/22 0000  Diet - low sodium heart healthy        05/21/22 1501           Cardiac and Carb modified diet DISCHARGE MEDICATION: Allergies as of 05/21/2022        Reactions   Fentanyl Other (See Comments)   Behavioral changes   Gabapentin Other (See Comments)   ankle swells   Lisinopril Other (See Comments)   unknown   Lyrica [pregabalin] Other (See Comments)   unknown   Metformin Diarrhea   Propofol Other (See Comments)   Heart rate dropped        Medication List     STOP taking these medications    metoprolol tartrate 25 MG tablet Commonly known as: LOPRESSOR       TAKE these medications    apixaban 5 MG Tabs tablet Commonly known as: ELIQUIS Take 1 tablet (5 mg total) by mouth 2 (two) times daily.   atorvastatin 40 MG tablet Commonly known as: LIPITOR Take 1 tablet (40 mg total) by mouth daily. Start taking on: May 22, 2022   carvedilol 3.125 MG tablet Commonly known as: COREG Take 1 tablet (3.125 mg total) by mouth 2 (two) times daily with a meal.   clopidogrel 75 MG tablet Commonly known as: PLAVIX Take 1 tablet (75 mg total) by mouth daily.   dapagliflozin propanediol 10 MG Tabs tablet Commonly known as: Farxiga Take 1 tablet (10 mg total) by mouth daily before breakfast.   Entresto 97-103 MG Generic drug: sacubitril-valsartan Take 1 tablet by mouth 2 (two) times daily.   escitalopram 20 MG tablet Commonly known as: LEXAPRO Take 20 mg by mouth daily.   furosemide  40 MG tablet Commonly known as: LASIX Take 1 tablet (40 mg total) by mouth daily. Start taking on: May 22, 2022 What changed: how much to take   glipiZIDE 5 MG tablet Commonly known as: GLUCOTROL Take 5 mg by mouth daily before breakfast.   hydrALAZINE 100 MG tablet Commonly known as: APRESOLINE Take 1 tablet (100 mg total) by mouth 3 (three) times daily.   isosorbide mononitrate 120 MG 24 hr tablet Commonly known as: IMDUR Take 1 tablet (120 mg total) by mouth daily. Start taking on: May 22, 2022 What changed: medication strength   levothyroxine 25 MCG tablet Commonly known as: SYNTHROID TAKE ONE TABLET BY MOUTH  DAILY BEFORE BREAKFAST   pantoprazole 40 MG tablet Commonly known as: PROTONIX Take 40 mg by mouth every other day.   spironolactone 25 MG tablet Commonly known as: ALDACTONE Take 1/2 tablet (12.5 mg total) by mouth daily.        Follow-up Information     [facility] Follow up.   Specialty: Cardiology Why: Monday, September 11 @ 11AM for [facility]. Bring all medications with you. Garage code: 34 at [employer], off [address]. Contact information: [id number] [place] [address] [phone]               Discharge Exam: Ceasar Mons Weights   05/19/22 0641 05/20/22 0433 05/21/22 0414  Weight: 100.6 kg 100.9 kg 101.2 kg   BP (!) 153/63 (BP Location: Left Arm)   Pulse 62   Temp 98.1 F (36.7 C) (Oral)   Resp 17   Ht  (1.778 m)   Wt 101.2 kg   SpO2 98%   BMI 32.00 kg/m   Patient is feeling well, no chest pain or dyspnea  Neurology awake and alert ENT with no pallor Cardiovascular with S1 and S2 present, irregularly irregular with no gallops, rubs or murmurs Respiratory with no rales or wheezing Abdomen not distended No lower extremity edema   Condition at discharge: stable  The results of significant diagnostics from this hospitalization (including imaging, microbiology, ancillary and laboratory) are listed below for reference.   Imaging Studies: VAS [place] LOWER EXTREMITY VENOUS (DVT)  Result Date: 05/21/2022  Lower Venous DVT Study Patient Name:  Albert Powell  Date of Exam:   05/21/2022 Medical Rec #: [id number]         Accession #:    [id number] Date of Birth: 14-Oct-1940         Patient Gender: M Patient Age:   22 years Exam Location:  [facility] Procedure:      VAS [place] LOWER EXTREMITY VENOUS (DVT) Referring Phys: Mikey College --------------------------------------------------------------------------------  Indications: Pulmonary  embolism.  Risk Factors: None identified. Limitations: Poor ultrasound/tissue interface. Comparison Study: No prior studies. Performing Technologist: Chanda Busing RVT  Examination Guidelines: A complete evaluation includes B-mode imaging, spectral Doppler, color Doppler, and power Doppler as needed of all accessible portions of each vessel. Bilateral testing is considered an integral part of a complete examination. Limited examinations for reoccurring indications may be performed as noted. The reflux portion of the exam is performed with the patient in reverse Trendelenburg.  +---------+---------------+---------+-----------+----------+--------------+ RIGHT    CompressibilityPhasicitySpontaneityPropertiesThrombus Aging +---------+---------------+---------+-----------+----------+--------------+ CFV      Full           Yes      Yes                                 +---------+---------------+---------+-----------+----------+--------------+  SFJ      Full                                                        +---------+---------------+---------+-----------+----------+--------------+ FV Prox  Full                                                        +---------+---------------+---------+-----------+----------+--------------+ FV Mid   Full                                                        +---------+---------------+---------+-----------+----------+--------------+ FV DistalFull                                                        +---------+---------------+---------+-----------+----------+--------------+ PFV      Full                                                        +---------+---------------+---------+-----------+----------+--------------+ POP      Full           Yes      Yes                                 +---------+---------------+---------+-----------+----------+--------------+ PTV      Full                                                         +---------+---------------+---------+-----------+----------+--------------+ PERO     Full                                                        +---------+---------------+---------+-----------+----------+--------------+   +---------+---------------+---------+-----------+----------+--------------+ LEFT     CompressibilityPhasicitySpontaneityPropertiesThrombus Aging +---------+---------------+---------+-----------+----------+--------------+ CFV      Full           Yes      Yes                                 +---------+---------------+---------+-----------+----------+--------------+ SFJ      Full                                                        +---------+---------------+---------+-----------+----------+--------------+  FV Prox  Full                                                        +---------+---------------+---------+-----------+----------+--------------+ FV Mid   Full                                                        +---------+---------------+---------+-----------+----------+--------------+ FV DistalFull                                                        +---------+---------------+---------+-----------+----------+--------------+ PFV      Full                                                        +---------+---------------+---------+-----------+----------+--------------+ POP      Full           Yes      Yes                                 +---------+---------------+---------+-----------+----------+--------------+ PTV      Full                                                        +---------+---------------+---------+-----------+----------+--------------+ PERO     Full                                                        +---------+---------------+---------+-----------+----------+--------------+    Summary: RIGHT: - There is no evidence of deep vein thrombosis in the lower extremity.  - No cystic structure found  in the popliteal fossa.  LEFT: - There is no evidence of deep vein thrombosis in the lower extremity.  - No cystic structure found in the popliteal fossa.  *See table(s) above for measurements and observations.    Preliminary    CARDIAC CATHETERIZATION  Result Date: 05/20/2022   Prox LAD to Mid LAD lesion is 40% stenosed.   Prox Cx to Mid Cx lesion is 30% stenosed.   Prox RCA to Mid RCA lesion is 60% stenosed.   1st Diag lesion is 60% stenosed.   Dist LAD lesion is 40% stenosed.   Non-stenotic Ost RCA to Prox RCA lesion was previously treated. 1.  Mild to moderate obstructive coronary artery disease; compared with the previous study the angiographic appearance looks relatively improved.  Overall the burden of coronary artery disease does not explain the patient's severe cardiomyopathy.  2.  Mean RA  pressure of 5 mmHg and mean wedge pressure of 5 mmHg with normal cardiac output of 5.9 L/min and cardiac index of 2.7 L/min/m. Recommendation: Goal-directed medical therapy.   DG Chest 2 View  Result Date: 05/14/2022 CLINICAL DATA:  Shortness of breath EXAM: CHEST - 2 VIEW COMPARISON:  Previous studies including the examination of 05/13/2022 FINDINGS: Transverse diameter of heart is slightly increased. Central pulmonary vessels are prominent without signs of alveolar pulmonary edema. There is increased density in both lower lung fields. There is slight improvement in this finding. There are no new focal infiltrates. There is no pneumothorax. There is blunting of both posterior costophrenic angles. Pain control lead is seen in thoracic spinal canal. IMPRESSION: Increased density in both lower lung fields may be due to small bilateral effusions and possibly underlying atelectasis/pneumonia. There are no signs of alveolar pulmonary edema or new focal infiltrates. Electronically Signed   By: Ernie Avena M.D.   On: 05/14/2022 15:23   ECHOCARDIOGRAM LIMITED  Result Date: 05/14/2022    ECHOCARDIOGRAM LIMITED  REPORT   Patient Name:   Albert Powell Date of Exam: 05/14/2022 Medical Rec #:  [id number]        Height:       70.0 in Accession #:    [id number]       Weight:       224.0 lb Date of Birth:  1941-01-13        BSA:          2.190 m Patient Age:    81 years         BP:           161/99 mmHg Patient Gender: M                HR:           102 bpm. Exam Location:  Inpatient Procedure: Limited Echo Indications:    CHF Acute Systolic I50.21  History:        Patient has prior history of Echocardiogram examinations, most                 recent 11/25/2021. CHF, CAD and Angina, PAD,                 Arrythmias:Bradycardia and Atrial Fibrillation; Risk                 Factors:Hypertension, Diabetes and Dyslipidemia. Elevated                 Troponin.  Sonographer:    Lucendia Herrlich Referring Phys: [id number] OLADAPO ADEFESO IMPRESSIONS  1. Left ventricular ejection fraction, by estimation, is 25 to 30%. The left ventricle has severely decreased function. The left ventricle demonstrates regional wall motion abnormalities (see scoring diagram/findings for description).  2. Right ventricular systolic function is normal. The right ventricular size is normal. Tricuspid regurgitation signal is inadequate for assessing PA pressure.  3. Trivial mitral valve regurgitation. No evidence of mitral stenosis.  4. The aortic valve is tricuspid. There is mild calcification of the aortic valve. There is mild thickening of the aortic valve. Aortic valve sclerosis/calcification is present, without any evidence of aortic stenosis.  5. The inferior vena cava is dilated in size with <50% respiratory variability, suggesting right atrial pressure of 15 mmHg. Comparison(s): Prior images reviewed side by side. The left ventricular function is significantly worse. The left ventricular wall motion abnormalities are significantly worse. FINDINGS  Left Ventricle: Left ventricular ejection fraction, by estimation, is  25 to 30%. The left ventricle has severely  decreased function. The left ventricle demonstrates regional wall motion abnormalities. Severe hypokinesis of the left ventricular, basal-mid inferoseptal wall, inferior wall and anterolateral wall. Mild hypokinesis of the left ventricular, entire anteroseptal wall, anterior wall and apical segment. The left ventricular internal cavity size was normal in size. There is no left ventricular hypertrophy. Right Ventricle: The right ventricular size is normal. No increase in right ventricular wall thickness. Right ventricular systolic function is normal. Tricuspid regurgitation signal is inadequate for assessing PA pressure. Right Atrium: Right atrial size was normal in size. Pericardium: Trivial pericardial effusion is present. Mitral Valve: Mild to moderate mitral annular calcification. Trivial mitral valve regurgitation. No evidence of mitral valve stenosis. Tricuspid Valve: The tricuspid valve is normal in structure. Tricuspid valve regurgitation is not demonstrated. Aortic Valve: The aortic valve is tricuspid. There is mild calcification of the aortic valve. There is mild thickening of the aortic valve. Aortic valve sclerosis/calcification is present, without any evidence of aortic stenosis. Pulmonic Valve: The pulmonic valve was normal in structure. Pulmonic valve regurgitation is not visualized. Aorta: The aortic root is normal in size and structure. Venous: The inferior vena cava is dilated in size with less than 50% respiratory variability, suggesting right atrial pressure of 15 mmHg. IAS/Shunts: No atrial level shunt detected by color flow Doppler. Thurmon Fair MD Electronically signed by Thurmon Fair MD Signature Date/Time: 05/14/2022/12:52:08 PM    Final    DG Chest Port 1 View  Result Date: 05/13/2022 CLINICAL DATA:  Shortness of breath, atrial fibrillation with rapid ventricular response EXAM: PORTABLE CHEST 1 VIEW COMPARISON:  Portable exam 1404 hours compared to 11/23/2021 FINDINGS: Enlargement of  cardiac silhouette. Mediastinal contours and pulmonary vascularity normal. Atherosclerotic calcification aorta. Bibasilar consolidation favor multifocal pneumonia over are locked assist or edema. Upper lungs clear. No pleural effusion or pneumothorax. Intraspinal stimulator noted. IMPRESSION: Enlargement of cardiac silhouette with bibasilar infiltrates favoring multifocal pneumonia as above. Aortic Atherosclerosis (ICD10-I70.0). Electronically Signed   By: Ulyses Southward M.D.   On: 05/13/2022 14:17    Microbiology: Results for orders placed or performed during the hospital encounter of 05/13/22  Resp Panel by RT-PCR (Flu A&B, Covid) Anterior Nasal Swab     Status: None   Collection Time: 05/13/22  2:26 PM   Specimen: Anterior Nasal Swab  Result Value Ref Range Status   SARS Coronavirus 2 by RT PCR NEGATIVE NEGATIVE Final    Comment: (NOTE) SARS-CoV-2 target nucleic acids are NOT DETECTED.  The SARS-CoV-2 RNA is generally detectable in upper respiratory specimens during the acute phase of infection. The lowest concentration of SARS-CoV-2 viral copies this assay can detect is 138 copies/mL. A negative result does not preclude SARS-Cov-2 infection and should not be used as the sole basis for treatment or other patient management decisions. A negative result may occur with  improper specimen collection/handling, submission of specimen other than nasopharyngeal swab, presence of viral mutation(s) within the areas targeted by this assay, and inadequate number of viral copies(<138 copies/mL). A negative result must be combined with clinical observations, patient history, and epidemiological information. The expected result is Negative.  Fact Sheet for Patients:  BloggerCourse.com  Fact Sheet for Healthcare Providers:  SeriousBroker.it  This test is no t yet approved or cleared by the [place] FDA and  has been authorized for detection and/or  diagnosis of SARS-CoV-2 by FDA under an Emergency Use Authorization (EUA). This EUA will remain  in effect (meaning this test can  be used) for the duration of the COVID-19 declaration under Section 564(b)(1) of the Act, 21 U.S.C.section 360bbb-3(b)(1), unless the authorization is terminated  or revoked sooner.       Influenza A by PCR NEGATIVE NEGATIVE Final   Influenza B by PCR NEGATIVE NEGATIVE Final    Comment: (NOTE) The Xpert Xpress SARS-CoV-2/FLU/RSV plus assay is intended as an aid in the diagnosis of influenza from Nasopharyngeal swab specimens and should not be used as a sole basis for treatment. Nasal washings and aspirates are unacceptable for Xpert Xpress SARS-CoV-2/FLU/RSV testing.  Fact Sheet for Patients: BloggerCourse.com  Fact Sheet for Healthcare Providers: SeriousBroker.it  This test is not yet approved or cleared by the [place] FDA and has been authorized for detection and/or diagnosis of SARS-CoV-2 by FDA under an Emergency Use Authorization (EUA). This EUA will remain in effect (meaning this test can be used) for the duration of the COVID-19 declaration under Section 564(b)(1) of the Act, 21 U.S.C. section 360bbb-3(b)(1), unless the authorization is terminated or revoked.  Performed at [facility], [address] [place], [place] [address]   Blood culture (routine x 2)     Status: None   Collection Time: 05/13/22  4:27 PM   Specimen: BLOOD  Result Value Ref Range Status   Specimen Description BLOOD RIGHT ANTECUBITAL  Final   Special Requests   Final    BOTTLES DRAWN AEROBIC AND ANAEROBIC Blood Culture results may not be optimal due to an excessive volume of blood received in culture bottles   Culture   Final    NO GROWTH 5 DAYS Performed at [facility], [address] [place], [place] [address]    Report Status 05/18/2022 FINAL  Final  Blood culture (routine x 2)     Status:  None   Collection Time: 05/13/22  4:27 PM   Specimen: BLOOD LEFT HAND  Result Value Ref Range Status   Specimen Description BLOOD LEFT HAND  Final   Special Requests   Final    BOTTLES DRAWN AEROBIC AND ANAEROBIC Blood Culture adequate volume   Culture   Final    NO GROWTH 5 DAYS Performed at [facility], [address] [place], [place] [address]    Report Status 05/18/2022 FINAL  Final    Labs: CBC: Recent Labs  Lab 05/15/22 0332 05/16/22 0529 05/19/22 0435 05/20/22 0757 05/20/22 1449 05/20/22 1451 05/20/22 1453 05/20/22 1455 05/21/22 0859  WBC 9.1 9.8 8.0 9.0  --   --   --   --  7.5  HGB 13.0 13.7 13.7 15.5 12.6* 12.2* 12.6* 12.6* 13.3  HCT 38.8* 41.8 40.5 46.4 37.0* 36.0* 37.0* 37.0* 40.6  MCV 90.7 91.1 90.2 90.4  --   --   --   --  92.5  PLT 214 233 245 239  --   --   --   --  251   Basic Metabolic Panel: Recent Labs  Lab 05/17/22 0259 05/18/22 0231 05/19/22 0435 05/20/22 0757 05/20/22 1449 05/20/22 1451 05/20/22 1453 05/20/22 1455 05/21/22 0859  NA 139 138 138 137 138 141 139 139 137  K 3.5 3.6 4.0 3.2* 3.7 3.5 3.6 3.7 3.9  CL 100 101 104 103  --   --   --   --  106  CO2 21*  --   --   --   --  22  GLUCOSE 127* 123* 115* 105*  --   --   --   --  178*  BUN 24* 22 22 20   --   --   --   --  16  CREATININE 1.71* 1.78* 1.72* 1.52*  --   --   --   --  1.62*  CALCIUM 8.6* 8.7* 8.9 9.0  --   --   --   --  8.9   Liver Function Tests: No results for input(s): "AST", "ALT", "ALKPHOS", "BILITOT", "PROT", "ALBUMIN" in the last 168 hours. CBG: Recent Labs  Lab 05/20/22 1120 05/20/22 1628 05/20/22 2058 05/21/22 0607 05/21/22 1120  GLUCAP 111* 102* 211* 111* 171*    Discharge time spent: greater than 30 minutes.  Signed: 07/21/22, MD [facility] 05/21/2022

## 2022-05-21 NOTE — Progress Notes (Signed)
Bilateral lower extremity venous duplex has been completed. Preliminary results can be found in CV Proc through chart review.   05/21/22 1:50 PM Olen Cordial RVT

## 2022-05-21 NOTE — Progress Notes (Signed)
Rounding Note    Patient Name: Albert Powell Date of Encounter: 05/21/2022  [facility] Cardiologist: Albert Batty, MD   Subjective  No chest pain or dyspnea seems ready for d/c   Inpatient Medications    Scheduled Meds:  apixaban  2.5 mg Oral BID   atorvastatin  40 mg Oral Daily   carvedilol  3.125 mg Oral BID WC   clopidogrel  75 mg Oral Daily   dapagliflozin propanediol  10 mg Oral Daily   dextromethorphan-guaiFENesin  1 tablet Oral BID   escitalopram  20 mg Oral Daily   furosemide  40 mg Oral Daily   hydrALAZINE  100 mg Oral TID   insulin aspart  0-5 Units Subcutaneous QHS   insulin aspart  0-9 Units Subcutaneous TID WC   isosorbide mononitrate  120 mg Oral Daily   levothyroxine  25 mcg Oral Q0600   pantoprazole  40 mg Oral QODAY   sacubitril-valsartan  1 tablet Oral BID   sodium chloride flush  3 mL Intravenous Q12H   Continuous Infusions:  sodium chloride     PRN Meds: sodium chloride, acetaminophen, guaiFENesin-dextromethorphan, hydrALAZINE, ondansetron (ZOFRAN) IV, sodium chloride flush   Vital Signs    Vitals:   05/20/22 1601 05/20/22 1712 05/20/22 1930 05/21/22 0414  BP: (!) 164/72  (!) 149/62 (!) 153/63  Pulse: (!) 57 70  62  Resp:    17  Temp:   98.1 F (36.7 C) 98.1 F (36.7 C)  TempSrc:   Oral Oral  SpO2:   100% 98%  Weight:    101.2 kg  Height:        Intake/Output Summary (Last 24 hours) at 05/21/2022 0910 Last data filed at 05/21/2022 0417 Gross per 24 hour  Intake 1178.14 ml  Output 1850 ml  Net -671.86 ml      05/21/2022    4:14 AM 05/20/2022    4:33 AM 05/19/2022    6:41 AM  Last 3 Weights  Weight (lbs) 223 lb 222 lb 8 oz 221 lb 11.2 oz  Weight (kg) 101.152 kg 100.925 kg 100.562 kg      Telemetry    SR first deg AVB, PACs - Personally Reviewed  ECG    SR first deg AVB, PACs, inf infarct pattern - Personally Reviewed  Physical Exam   GEN: Well nourished, well developed in no acute distress NECK: No  JVD CARDIAC: regular rhythm, normal S1 and S2, no rubs or gallops. No murmur. VASCULAR: Radial pulses 2+ bilaterally.  RESPIRATORY:  Clear to auscultation without rales, wheezing or rhonchi  ABDOMEN: Soft, non-tender, non-distended MUSCULOSKELETAL:  Moves all 4 limbs independently SKIN: Warm and dry, no edema NEUROLOGIC:  No focal neuro deficits noted. PSYCHIATRIC:  Normal affect    Labs    High Sensitivity Troponin:   Recent Labs  Lab 05/13/22 1627 05/13/22 2131  TROPONINIHS 96* 85*     Chemistry Recent Labs  Lab 05/18/22 0231 05/19/22 0435 05/20/22 0757 05/20/22 1449 05/20/22 1451 05/20/22 1453 05/20/22 1455  NA 138 138 137   < > 141 139 139  K 3.6 4.0 3.2*   < > 3.5 3.6 3.7  CL 101 104 103  --   --   --   --   CO2 30 26 21*  --   --   --   --   GLUCOSE 123* 115* 105*  --   --   --   --   BUN 22 22 20   --   --   --   --  CREATININE 1.78* 1.72* 1.52*  --   --   --   --   CALCIUM 8.7* 8.9 9.0  --   --   --   --   GFRNONAA 38* 40* 46*  --   --   --   --   ANIONGAP 7 8 13   --   --   --   --    < > = values in this interval not displayed.    Lipids  Recent Labs  Lab 05/16/22 0529  CHOL 136  TRIG 115  HDL 43  LDLCALC 70  CHOLHDL 3.2    Hematology Recent Labs  Lab 05/16/22 0529 05/19/22 0435 05/20/22 0757 05/20/22 1449 05/20/22 1451 05/20/22 1453 05/20/22 1455  WBC 9.8 8.0 9.0  --   --   --   --   RBC 4.59 4.49 5.13  --   --   --   --   HGB 13.7 13.7 15.5   < > 12.2* 12.6* 12.6*  HCT 41.8 40.5 46.4   < > 36.0* 37.0* 37.0*  MCV 91.1 90.2 90.4  --   --   --   --   MCH 29.8 30.5 30.2  --   --   --   --   MCHC 32.8 33.8 33.4  --   --   --   --   RDW 14.0 14.1 14.2  --   --   --   --   PLT 233 245 239  --   --   --   --    < > = values in this interval not displayed.   Thyroid No results for input(s): "TSH", "FREET4" in the last 168 hours.  BNP No results for input(s): "BNP", "PROBNP" in the last 168 hours.   DDimer No results for input(s): "DDIMER"  in the last 168 hours.   Radiology    CARDIAC CATHETERIZATION  Result Date: 05/20/2022   Prox LAD to Mid LAD lesion is 40% stenosed.   Prox Cx to Mid Cx lesion is 30% stenosed.   Prox RCA to Mid RCA lesion is 60% stenosed.   1st Diag lesion is 60% stenosed.   Dist LAD lesion is 40% stenosed.   Non-stenotic Ost RCA to Prox RCA lesion was previously treated. 1.  Mild to moderate obstructive coronary artery disease; compared with the previous study the angiographic appearance looks relatively improved.  Overall the burden of coronary artery disease does not explain the patient's severe cardiomyopathy.  2.  Mean RA pressure of 5 mmHg and mean wedge pressure of 5 mmHg with normal cardiac output of 5.9 L/min and cardiac index of 2.7 L/min/m. Recommendation: Goal-directed medical therapy.     Cardiac Studies   Echo as above Patient Profile     81 y.o. male with PMH of CAD, PAD, chronic diastolic heart failure, paroxysmal atrial fibrillation, and hypertension who presented with several days of shortness of breath, Powell to have HFrEF with decompensated HF.  Assessment & Plan    Principal Problem:   Acute combined systolic and diastolic heart failure ([name]) Active Problems:   Coronary artery disease involving native coronary artery of native heart without angina pectoris   Mixed hyperlipidemia   Obesity (BMI 30-39.9)- negative sleep study in the past   Atrial fibrillation with RVR ([name])   Hypertensive emergency   Chronic kidney disease, stage 3a ([name])   GERD (gastroesophageal reflux disease)   Acquired hypothyroidism   Type 2 diabetes mellitus with hyperlipidemia (  Albert Powell)  Acute systolic and diastolic heart failure (previously chronic diastolic heart failure) Chronic kidney disease stage 3a - euvolemic  - right heart cath yesterday normal filling pressures - continue current GDMT  - EF 25-30% by TTE    Atrial fibrillation, persistent vs paroxysmal -CHA2DS2/VASC 6 -in SR - resume eliquis     Hypertension -have restarted entresto at a lower dose (due to concerns for kidney function).  -hydralazine and imdur increased.  Will arrange outpatient f/u with Dr Albert Powell    For questions or updates, please contact Albert Powell Please consult [email or web address] for contact info under        Signed, Albert Rouge, MD  05/21/2022, 9:10 AM

## 2022-05-21 NOTE — Progress Notes (Signed)
PT Cancellation Note  Patient Details Name: Albert Powell MRN: [id number] DOB: 22-Apr-1941   Cancelled Treatment:    Reason Eval/Treat Not Completed: Patient declined, no reason specified. Pt refuses mobility this morning, reports he is preparing for discharge and needs to get ready to leave.   Arlyss Gandy 05/21/2022, 11:42 AM

## 2022-05-21 NOTE — Progress Notes (Signed)
   05/21/22 1546  Mobility  Activity Ambulated with assistance in hallway  Level of Assistance Standby assist, set-up cues, supervision of patient - no hands on  Assistive Device Front wheel walker  Distance Ambulated (ft) 200 ft  Activity Response Tolerated well  $Mobility charge 1 Mobility   Mobility Specialist Progress Note  During Mobility: 85 HR Post-Mobility:98% SpO2  Received pt in chair having no complaints and agreeable to mobility. Pt was asymptomatic throughout ambulation and returned to room w/o fault. Left in chair w/ call bell in reach and all needs met.   Lucious Groves Mobility Specialist

## 2022-05-22 LAB — LIPOPROTEIN A (LPA): Lipoprotein (a): 14.3 nmol/L (ref <75.0)

## 2022-05-26 ENCOUNTER — Ambulatory Visit (HOSPITAL_COMMUNITY): Payer: Medicare Other

## 2022-05-26 ENCOUNTER — Telehealth (HOSPITAL_COMMUNITY): Payer: Self-pay | Admitting: *Deleted

## 2022-05-26 NOTE — Telephone Encounter (Signed)
Heart Failure Nurse Navigator Progress Note   Unable to leave appointment reminder message for 11 am on 05/26/22, due to no voicemail set up.   Rhae Hammock, BSN, Scientist, clinical (histocompatibility and immunogenetics) Only

## 2022-06-02 ENCOUNTER — Emergency Department (HOSPITAL_BASED_OUTPATIENT_CLINIC_OR_DEPARTMENT_OTHER): Payer: Medicare Other

## 2022-06-02 ENCOUNTER — Inpatient Hospital Stay (HOSPITAL_BASED_OUTPATIENT_CLINIC_OR_DEPARTMENT_OTHER)
Admission: EM | Admit: 2022-06-02 | Discharge: 2022-06-06 | DRG: 291 | Disposition: A | Discharge status: Home / Self Care | Payer: Medicare Other | Attending: Internal Medicine | Admitting: Internal Medicine

## 2022-06-02 ENCOUNTER — Emergency Department (HOSPITAL_BASED_OUTPATIENT_CLINIC_OR_DEPARTMENT_OTHER): Payer: Medicare Other | Admitting: Radiology

## 2022-06-02 ENCOUNTER — Other Ambulatory Visit (HOSPITAL_BASED_OUTPATIENT_CLINIC_OR_DEPARTMENT_OTHER): Payer: Self-pay

## 2022-06-02 ENCOUNTER — Other Ambulatory Visit: Payer: Self-pay

## 2022-06-02 ENCOUNTER — Encounter (HOSPITAL_BASED_OUTPATIENT_CLINIC_OR_DEPARTMENT_OTHER): Payer: Self-pay | Admitting: Pediatrics

## 2022-06-02 DIAGNOSIS — E871 Hypo-osmolality and hyponatremia: Secondary | ICD-10-CM | POA: Diagnosis not present

## 2022-06-02 DIAGNOSIS — E039 Hypothyroidism, unspecified: Secondary | ICD-10-CM | POA: Diagnosis present

## 2022-06-02 DIAGNOSIS — I160 Hypertensive urgency: Secondary | ICD-10-CM | POA: Diagnosis present

## 2022-06-02 DIAGNOSIS — E876 Hypokalemia: Secondary | ICD-10-CM | POA: Diagnosis present

## 2022-06-02 DIAGNOSIS — E1151 Type 2 diabetes mellitus with diabetic peripheral angiopathy without gangrene: Secondary | ICD-10-CM | POA: Diagnosis present

## 2022-06-02 DIAGNOSIS — E1122 Type 2 diabetes mellitus with diabetic chronic kidney disease: Secondary | ICD-10-CM | POA: Diagnosis not present

## 2022-06-02 DIAGNOSIS — D72829 Elevated white blood cell count, unspecified: Secondary | ICD-10-CM | POA: Diagnosis not present

## 2022-06-02 DIAGNOSIS — J9811 Atelectasis: Secondary | ICD-10-CM | POA: Diagnosis not present

## 2022-06-02 DIAGNOSIS — I5023 Acute on chronic systolic (congestive) heart failure: Secondary | ICD-10-CM | POA: Diagnosis present

## 2022-06-02 DIAGNOSIS — F17210 Nicotine dependence, cigarettes, uncomplicated: Secondary | ICD-10-CM | POA: Diagnosis present

## 2022-06-02 DIAGNOSIS — R0602 Shortness of breath: Secondary | ICD-10-CM

## 2022-06-02 DIAGNOSIS — N1832 Chronic kidney disease, stage 3b: Secondary | ICD-10-CM | POA: Diagnosis not present

## 2022-06-02 DIAGNOSIS — Z885 Allergy status to narcotic agent status: Secondary | ICD-10-CM

## 2022-06-02 DIAGNOSIS — E669 Obesity, unspecified: Secondary | ICD-10-CM | POA: Diagnosis present

## 2022-06-02 DIAGNOSIS — I248 Other forms of acute ischemic heart disease: Secondary | ICD-10-CM | POA: Diagnosis present

## 2022-06-02 DIAGNOSIS — Z20822 Contact with and (suspected) exposure to covid-19: Secondary | ICD-10-CM | POA: Diagnosis present

## 2022-06-02 DIAGNOSIS — I161 Hypertensive emergency: Secondary | ICD-10-CM | POA: Diagnosis not present

## 2022-06-02 DIAGNOSIS — I130 Hypertensive heart and chronic kidney disease with heart failure and stage 1 through stage 4 chronic kidney disease, or unspecified chronic kidney disease: Secondary | ICD-10-CM | POA: Diagnosis not present

## 2022-06-02 DIAGNOSIS — I509 Heart failure, unspecified: Principal | ICD-10-CM

## 2022-06-02 DIAGNOSIS — I110 Hypertensive heart disease with heart failure: Secondary | ICD-10-CM | POA: Diagnosis not present

## 2022-06-02 DIAGNOSIS — Z7901 Long term (current) use of anticoagulants: Secondary | ICD-10-CM

## 2022-06-02 DIAGNOSIS — R0609 Other forms of dyspnea: Secondary | ICD-10-CM | POA: Diagnosis not present

## 2022-06-02 DIAGNOSIS — I5041 Acute combined systolic (congestive) and diastolic (congestive) heart failure: Secondary | ICD-10-CM | POA: Diagnosis not present

## 2022-06-02 DIAGNOSIS — J90 Pleural effusion, not elsewhere classified: Secondary | ICD-10-CM | POA: Diagnosis not present

## 2022-06-02 DIAGNOSIS — I428 Other cardiomyopathies: Secondary | ICD-10-CM | POA: Diagnosis present

## 2022-06-02 DIAGNOSIS — Z9049 Acquired absence of other specified parts of digestive tract: Secondary | ICD-10-CM

## 2022-06-02 DIAGNOSIS — Z7984 Long term (current) use of oral hypoglycemic drugs: Secondary | ICD-10-CM

## 2022-06-02 DIAGNOSIS — Z91148 Patient's other noncompliance with medication regimen for other reason: Secondary | ICD-10-CM

## 2022-06-02 DIAGNOSIS — Z955 Presence of coronary angioplasty implant and graft: Secondary | ICD-10-CM

## 2022-06-02 DIAGNOSIS — I5043 Acute on chronic combined systolic (congestive) and diastolic (congestive) heart failure: Secondary | ICD-10-CM | POA: Diagnosis present

## 2022-06-02 DIAGNOSIS — I169 Hypertensive crisis, unspecified: Secondary | ICD-10-CM | POA: Diagnosis present

## 2022-06-02 DIAGNOSIS — I4891 Unspecified atrial fibrillation: Secondary | ICD-10-CM | POA: Diagnosis present

## 2022-06-02 DIAGNOSIS — I480 Paroxysmal atrial fibrillation: Secondary | ICD-10-CM | POA: Diagnosis present

## 2022-06-02 DIAGNOSIS — J449 Chronic obstructive pulmonary disease, unspecified: Secondary | ICD-10-CM | POA: Diagnosis not present

## 2022-06-02 DIAGNOSIS — I10 Essential (primary) hypertension: Secondary | ICD-10-CM | POA: Diagnosis not present

## 2022-06-02 DIAGNOSIS — Z79899 Other long term (current) drug therapy: Secondary | ICD-10-CM

## 2022-06-02 DIAGNOSIS — I214 Non-ST elevation (NSTEMI) myocardial infarction: Secondary | ICD-10-CM

## 2022-06-02 DIAGNOSIS — Z8371 Family history of colonic polyps: Secondary | ICD-10-CM

## 2022-06-02 DIAGNOSIS — N179 Acute kidney failure, unspecified: Secondary | ICD-10-CM | POA: Diagnosis present

## 2022-06-02 DIAGNOSIS — Z888 Allergy status to other drugs, medicaments and biological substances status: Secondary | ICD-10-CM

## 2022-06-02 DIAGNOSIS — Z7902 Long term (current) use of antithrombotics/antiplatelets: Secondary | ICD-10-CM

## 2022-06-02 DIAGNOSIS — F17290 Nicotine dependence, other tobacco product, uncomplicated: Secondary | ICD-10-CM | POA: Diagnosis present

## 2022-06-02 DIAGNOSIS — Z79890 Hormone replacement therapy (postmenopausal): Secondary | ICD-10-CM

## 2022-06-02 DIAGNOSIS — R0902 Hypoxemia: Secondary | ICD-10-CM | POA: Diagnosis present

## 2022-06-02 DIAGNOSIS — I2510 Atherosclerotic heart disease of native coronary artery without angina pectoris: Secondary | ICD-10-CM | POA: Diagnosis not present

## 2022-06-02 DIAGNOSIS — Z823 Family history of stroke: Secondary | ICD-10-CM

## 2022-06-02 DIAGNOSIS — K219 Gastro-esophageal reflux disease without esophagitis: Secondary | ICD-10-CM | POA: Diagnosis not present

## 2022-06-02 DIAGNOSIS — I4819 Other persistent atrial fibrillation: Secondary | ICD-10-CM | POA: Diagnosis not present

## 2022-06-02 DIAGNOSIS — E785 Hyperlipidemia, unspecified: Secondary | ICD-10-CM | POA: Diagnosis not present

## 2022-06-02 DIAGNOSIS — E1169 Type 2 diabetes mellitus with other specified complication: Secondary | ICD-10-CM | POA: Diagnosis not present

## 2022-06-02 DIAGNOSIS — Z833 Family history of diabetes mellitus: Secondary | ICD-10-CM

## 2022-06-02 DIAGNOSIS — Z825 Family history of asthma and other chronic lower respiratory diseases: Secondary | ICD-10-CM

## 2022-06-02 DIAGNOSIS — E119 Type 2 diabetes mellitus without complications: Secondary | ICD-10-CM

## 2022-06-02 DIAGNOSIS — Z8349 Family history of other endocrine, nutritional and metabolic diseases: Secondary | ICD-10-CM

## 2022-06-02 DIAGNOSIS — Z8249 Family history of ischemic heart disease and other diseases of the circulatory system: Secondary | ICD-10-CM

## 2022-06-02 LAB — BASIC METABOLIC PANEL
Anion gap: 13 (ref 5–15)
BUN: 14 mg/dL (ref 8–23)
CO2: 24 mmol/L (ref 22–32)
Calcium: 9.5 mg/dL (ref 8.9–10.3)
Chloride: 99 mmol/L (ref 98–111)
Creatinine, Ser: 1.44 mg/dL — ABNORMAL HIGH (ref 0.61–1.24)
GFR, Estimated: 49 mL/min — ABNORMAL LOW (ref >60)
Glucose, Bld: 163 mg/dL — ABNORMAL HIGH (ref 70–99)
Potassium: 3.2 mmol/L — ABNORMAL LOW (ref 3.5–5.1)
Sodium: 136 mmol/L (ref 135–145)

## 2022-06-02 LAB — BRAIN NATRIURETIC PEPTIDE: B Natriuretic Peptide: 3560 pg/mL — ABNORMAL HIGH (ref 0.0–100.0)

## 2022-06-02 LAB — TROPONIN I (HIGH SENSITIVITY)
Troponin I (High Sensitivity): <2 ng/L (ref <18)
Troponin I (High Sensitivity): 37 ng/L — ABNORMAL HIGH (ref <18)
Troponin I (High Sensitivity): 35 ng/L — ABNORMAL HIGH (ref <18)

## 2022-06-02 LAB — CBC
HCT: 43.6 % (ref 39.0–52.0)
Hemoglobin: 14.7 g/dL (ref 13.0–17.0)
MCH: 30.4 pg (ref 26.0–34.0)
MCHC: 33.7 g/dL (ref 30.0–36.0)
MCV: 90.1 fL (ref 80.0–100.0)
Platelets: 293 10*3/uL (ref 150–400)
RBC: 4.84 MIL/uL (ref 4.22–5.81)
RDW: 14.1 % (ref 11.5–15.5)
WBC: 11.4 10*3/uL — ABNORMAL HIGH (ref 4.0–10.5)
nRBC: 0 % (ref 0.0–0.2)

## 2022-06-02 LAB — RESP PANEL BY RT-PCR (FLU A&B, COVID) ARPGX2
Influenza A by PCR: NEGATIVE
Influenza B by PCR: NEGATIVE
SARS Coronavirus 2 by RT PCR: NEGATIVE

## 2022-06-02 LAB — CBG MONITORING, ED: Glucose-Capillary: 158 mg/dL — ABNORMAL HIGH (ref 70–99)

## 2022-06-02 LAB — D-DIMER, QUANTITATIVE: D-Dimer, Quant: 1.22 ug/mL-FEU — ABNORMAL HIGH (ref 0.00–0.50)

## 2022-06-02 MED ORDER — ISOSORBIDE MONONITRATE ER 60 MG PO TB24: 120.0000 mg | ORAL_TABLET | ORAL | Status: DC

## 2022-06-02 MED ORDER — FUROSEMIDE 10 MG/ML IJ SOLN
60.0000 mg | Freq: Once | INTRAMUSCULAR | Status: AC
  Administered 2022-06-02: 60 mg via INTRAVENOUS
  Filled 2022-06-02: qty 6

## 2022-06-02 MED ORDER — CARVEDILOL 6.25 MG PO TABS
3.1250 mg | ORAL_TABLET | ORAL | Status: AC
Start: 2022-06-02 — End: 2022-06-02
  Administered 2022-06-02: 3.125 mg via ORAL
  Filled 2022-06-02: qty 1

## 2022-06-02 MED ORDER — IOHEXOL 350 MG/ML SOLN
100.0000 mL | Freq: Once | INTRAVENOUS | Status: AC | PRN
  Administered 2022-06-02: 75 mL via INTRAVENOUS

## 2022-06-02 MED ORDER — LABETALOL HCL 5 MG/ML IV SOLN
20.0000 mg | Freq: Once | INTRAVENOUS | Status: AC
  Administered 2022-06-02: 20 mg via INTRAVENOUS
  Filled 2022-06-02: qty 4

## 2022-06-02 MED ORDER — HYDRALAZINE HCL 20 MG/ML IJ SOLN
5.0000 mg | Freq: Once | INTRAMUSCULAR | Status: AC
  Administered 2022-06-02: 5 mg via INTRAVENOUS
  Filled 2022-06-02: qty 1

## 2022-06-02 MED ORDER — LABETALOL HCL 5 MG/ML IV SOLN: 20.0000 mg | Freq: Once | INTRAVENOUS | Status: DC

## 2022-06-02 MED ORDER — HYDRALAZINE HCL 20 MG/ML IJ SOLN
10.0000 mg | INTRAMUSCULAR | Status: AC | PRN
  Administered 2022-06-02 (×2): 10 mg via INTRAVENOUS
  Filled 2022-06-02 (×2): qty 1

## 2022-06-02 MED ORDER — POTASSIUM CHLORIDE CRYS ER 20 MEQ PO TBCR
60.0000 meq | EXTENDED_RELEASE_TABLET | Freq: Once | ORAL | Status: AC
  Administered 2022-06-02: 60 meq via ORAL
  Filled 2022-06-02: qty 3

## 2022-06-02 MED ORDER — ONDANSETRON HCL 4 MG/2ML IJ SOLN
4.0000 mg | Freq: Once | INTRAMUSCULAR | Status: AC | PRN
  Administered 2022-06-03: 4 mg via INTRAVENOUS
  Filled 2022-06-02: qty 2

## 2022-06-02 MED ORDER — HYDRALAZINE HCL 25 MG PO TABS
100.0000 mg | ORAL_TABLET | ORAL | Status: AC
  Administered 2022-06-02: 100 mg via ORAL
  Filled 2022-06-02: qty 4

## 2022-06-02 MED ORDER — NITROGLYCERIN IN D5W 200-5 MCG/ML-% IV SOLN
0.0000 ug/min | INTRAVENOUS | Status: DC
  Administered 2022-06-02: 5 ug/min via INTRAVENOUS
  Filled 2022-06-02: qty 250

## 2022-06-02 NOTE — ED Notes (Signed)
Passed along to floor RN that nitro drip had to be started on patient.

## 2022-06-02 NOTE — ED Provider Notes (Signed)
[facility] EMERGENCY DEPT Provider Note   CSN: [id number] Arrival date & time: 06/02/22  1257     History  Chief Complaint  Patient presents with   Emesis   Shortness of Breath    Albert Powell is a 81 y.o. male.  81 year old male with a history of CAD, CHF, COPD presents to the emergency department with shortness of breath.  Patient states that his shortness of breath started last night.  Says that he has had significant dyspnea on exertion since it began.  Denies any leg swelling.  Denies any chest pain.  Says that he has had occasional dry heaving since then but denies any vomiting or diaphoresis.  No fevers.  Says that he has a dry cough which is chronic.  Denies any lower extremity swelling and says that he has been compliant with his medications at home aside from not taking his morning medications which included carvedilol, Imdur, hydralazine, and furosemide.   Past Medical History:  Diagnosis Date   Atrial fibrillation ([place]) 03/30/2020   Bradycardia, sinus 02/18/2013   CAD (coronary artery disease)    stent to RCA 2003 also had 40% lesion at that time; NUCLEAR STRESS TEST, 01/16/2010 - normal  now with cath 80-90% stenosis in LAD, will try medical therapy if no improvement PCI   Carotid artery disease ([name])    Cataract    CHF (congestive heart failure) ([place])    Claudication ([place])    LEA DUPLEX, 07/07/2008 - Normal   COPD (chronic obstructive pulmonary disease) ([place])    DDD (degenerative disc disease) 2008   Diabetes mellitus    GERD (gastroesophageal reflux disease)    H/O hiatal hernia    History of colonoscopy 2004   finding of tics and AMV only no polpys   Hyperlipidemia 02/05/2013   Hypertension    Onychomycosis    Rosacea    Tobacco abuse 02/05/2013      Home Medications Prior to Admission medications   Medication Sig Start Date End Date Taking? Authorizing Provider  apixaban (ELIQUIS) 5 MG TABS tablet Take 1 tablet (5 mg total) by mouth  2 (two) times daily. 03/21/21   Katherine Roan, MD  atorvastatin (LIPITOR) 40 MG tablet Take 1 tablet (40 mg total) by mouth daily. 05/22/22 06/21/22  Arrien, Jimmy Picket, MD  carvedilol (COREG) 3.125 MG tablet Take 1 tablet (3.125 mg total) by mouth 2 (two) times daily with a meal. 05/21/22 06/20/22  Arrien, Jimmy Picket, MD  clopidogrel (PLAVIX) 75 MG tablet Take 1 tablet (75 mg total) by mouth daily. 01/04/21   Little Ishikawa, MD  dapagliflozin propanediol (FARXIGA) 10 MG TABS tablet Take 1 tablet (10 mg total) by mouth daily before breakfast. 04/25/21   Katherine Roan, MD  escitalopram (LEXAPRO) 20 MG tablet Take 20 mg by mouth daily. 01/18/21   [provider]  furosemide (LASIX) 40 MG tablet Take 1 tablet (40 mg total) by mouth daily. 05/22/22 06/21/22  Arrien, Jimmy Picket, MD  glipiZIDE (GLUCOTROL) 5 MG tablet Take 5 mg by mouth daily before breakfast.    [provider]  hydrALAZINE (APRESOLINE) 100 MG tablet Take 1 tablet (100 mg total) by mouth 3 (three) times daily. 05/21/22 06/20/22  Arrien, Jimmy Picket, MD  isosorbide mononitrate (IMDUR) 60 MG 24 hr tablet Take 2 tablets (120 mg total) by mouth daily. 05/22/22 06/21/22  Arrien, Jimmy Picket, MD  levothyroxine (SYNTHROID) 25 MCG tablet TAKE ONE TABLET BY MOUTH DAILY BEFORE BREAKFAST 03/13/22  Lorretta Harp, MD  pantoprazole (PROTONIX) 40 MG tablet Take 40 mg by mouth every other day.    [provider]  sacubitril-valsartan (ENTRESTO) 97-103 MG Take 1 tablet by mouth 2 (two) times daily. 12/13/21   Consuelo Pandy, PA-C  spironolactone (ALDACTONE) 25 MG tablet Take 1/2 tablet (12.5 mg total) by mouth daily. 08/09/21 05/13/22  Pokhrel, Corrie Mckusick, MD      Allergies    Fentanyl, Gabapentin, Lisinopril, Lyrica [pregabalin], Metformin, and Propofol    Review of Systems   Review of Systems  Physical Exam Updated Vital Signs BP (!) 193/87   Pulse 79   Temp 98.3 F (36.8 C) (Oral)   Resp 12    Ht 5\' 10"  (1.778 m)   Wt 101.2 kg   SpO2 93%   BMI 32.00 kg/m  Physical Exam Vitals and nursing note reviewed.  Constitutional:      General: He is not in acute distress.    Appearance: He is well-developed. He is obese.  HENT:     Head: Normocephalic and atraumatic.     Right Ear: External ear normal.     Left Ear: External ear normal.     Nose: Nose normal.  Eyes:     Extraocular Movements: Extraocular movements intact.     Conjunctiva/sclera: Conjunctivae normal.     Pupils: Pupils are equal, round, and reactive to light.  Cardiovascular:     Rate and Rhythm: Normal rate and regular rhythm.     Heart sounds: Normal heart sounds.  Pulmonary:     Effort: Pulmonary effort is normal. No respiratory distress.     Breath sounds: Normal breath sounds.     Comments: Satting well on room air.  Speaking full sentences.  Breath sounds diminished in right base but no wheezing, rales, or rhonchi noted. Abdominal:     General: There is no distension.     Palpations: Abdomen is soft. There is no mass.     Tenderness: There is no abdominal tenderness. There is no guarding.  Musculoskeletal:        General: No swelling.     Cervical back: Normal range of motion and neck supple.     Right lower leg: Edema present.     Left lower leg: Edema present.  Skin:    General: Skin is warm and dry.     Capillary Refill: Capillary refill takes less than 2 seconds.  Neurological:     Mental Status: He is alert. Mental status is at baseline.  Psychiatric:        Mood and Affect: Mood normal.        Behavior: Behavior normal.     ED Results / Procedures / Treatments   Labs (all labs ordered are listed, but only abnormal results are displayed) Labs Reviewed  BASIC METABOLIC PANEL - Abnormal; Notable for the following components:      Result Value   Potassium 3.2 (*)    Glucose, Bld 163 (*)    Creatinine, Ser 1.44 (*)    GFR, Estimated 49 (*)    All other components within normal limits   CBC - Abnormal; Notable for the following components:   WBC 11.4 (*)    All other components within normal limits  D-DIMER, QUANTITATIVE - Abnormal; Notable for the following components:   D-Dimer, Quant 1.22 (*)    All other components within normal limits  BRAIN NATRIURETIC PEPTIDE - Abnormal; Notable for the following components:   B Natriuretic Peptide  3,560.0 (*)    All other components within normal limits  CBG MONITORING, ED - Abnormal; Notable for the following components:   Glucose-Capillary 158 (*)    All other components within normal limits  TROPONIN I (HIGH SENSITIVITY) - Abnormal; Notable for the following components:   Troponin I (High Sensitivity) 37 (*)    All other components within normal limits  RESP PANEL BY RT-PCR (FLU A&B, COVID) ARPGX2  TROPONIN I (HIGH SENSITIVITY)    EKG None  Radiology CT Angio Chest PE W and/or Wo Contrast  Result Date: 06/02/2022 CLINICAL DATA:  Dyspnea on exertion. Cough. Evaluate for pulmonary embolism. History of congestive heart failure, cardiac catheterization/stent, and diabetes. EXAM: CT ANGIOGRAPHY CHEST WITH CONTRAST TECHNIQUE: Multidetector CT imaging of the chest was performed using the standard protocol during bolus administration of intravenous contrast. Multiplanar CT image reconstructions and MIPs were obtained to evaluate the vascular anatomy. RADIATION DOSE REDUCTION: This exam was performed according to the departmental dose-optimization program which includes automated exposure control, adjustment of the mA and/or kV according to patient size and/or use of iterative reconstruction technique. CONTRAST:  23mL OMNIPAQUE IOHEXOL 350 MG/ML SOLN COMPARISON:  Chest two views 06/02/2022 and 05/14/2022 FINDINGS: Cardiovascular: The main pulmonary artery is opacified up to 421 Hounsfield units. No filling defect is seen to indicate an acute pulmonary embolism. No thoracic aortic aneurysm. Dense coronary artery calcifications.  High-grade atherosclerotic calcifications throughout the thoracic aorta. Mediastinum/Nodes: No axillary lymphadenopathy. There are scattered subcentimeter short axis prevascular and right paratracheal lymph nodes. 11 mm short axis precarinal lymph node, likely reactive (axial series 5, image 130). 14 mm short axis subcarinal lymph node (axial series 5, image 155, coronal series 7, image 79), likely reactive. Mild right hilar likely lymph node soft tissue thickening. The visualized thyroid is unremarkable. Minimal diffuse esophageal wall thickening may be secondary to chronic inflammation. No focal mass is seen. Lungs/Pleura: The central airways are patent. Mild centrilobular emphysematous changes predominantly within the bilateral upper lungs. Moderate right and mild left pleural effusions with associated bilateral lower lobe compressive atelectasis. Upper Abdomen: There is moderate left and right adrenal gland thickening. There is low-density approximate 11 mm left adrenal nodule (axial series 4 images [id number]) with central low density of -13 Hounsfield units suggesting a benign lipid rich adenoma. Minimally partially visualized likely fluid density cyst within the upper pole of the left kidney. The rest of the left kidney itself is not imaged. No follow-up imaging recommended. Musculoskeletal: Moderate multilevel degenerative disc changes of the thoracic spine. Severe C6-7 disc space narrowing. Mild retrolisthesis of C6 on C7. Moderate superior L1 and mild superior T10 vertebral body degenerative Schmorl's nodes. A spinal cord stimulator electrode is seen within the posterior aspect of the central canal at the T7 through T9 levels. Review of the MIP images confirms the above findings. IMPRESSION: 1. No acute pulmonary embolism is seen. 2. Moderate right and mild left pleural effusions with associated bilateral lower lobe compressive atelectasis. 3. Mildly enlarged precarinal lymph node, likely reactive. Aortic  Atherosclerosis (ICD10-I70.0) and Emphysema (ICD10-J43.9). Electronically Signed   By: Yvonne Kendall M.D.   On: 06/02/2022 16:03   DG Chest 2 View  Result Date: 06/02/2022 CLINICAL DATA:  Shortness of breath EXAM: CHEST - 2 VIEW COMPARISON:  05/14/2022 FINDINGS: Mild bilateral interstitial thickening. No focal consolidation. No pleural effusion or pneumothorax. Heart and mediastinal contours are unremarkable. No acute osseous abnormality. Spinal stimulator noted. IMPRESSION: 1. Mild bilateral interstitial thickening which may reflect interstitial edema versus  infection. Electronically Signed   By: Kathreen Devoid M.D.   On: 06/02/2022 13:44    Procedures Procedures   Medications Ordered in ED Medications  carvedilol (COREG) tablet 3.125 mg (3.125 mg Oral Given 06/02/22 1450)  hydrALAZINE (APRESOLINE) tablet 100 mg (100 mg Oral Given 06/02/22 1450)  furosemide (LASIX) injection 60 mg (60 mg Intravenous Given 06/02/22 1451)  potassium chloride SA (KLOR-CON M) CR tablet 60 mEq (60 mEq Oral Given 06/02/22 1451)  iohexol (OMNIPAQUE) 350 MG/ML injection 100 mL (75 mLs Intravenous Contrast Given 06/02/22 1519)  labetalol (NORMODYNE) injection 20 mg (20 mg Intravenous Given 06/02/22 1614)  hydrALAZINE (APRESOLINE) injection 5 mg (5 mg Intravenous Given 06/02/22 1733)    ED Course/ Medical Decision Making/ A&P Clinical Course as of 06/02/22 1738  Mon Jun 02, 2022  1404 DG Chest 2 View IMPRESSION: 1. Mild bilateral interstitial thickening which may reflect interstitial edema versus infection. [RP]  5366 CXR on my read also shows R sided pleural effusion.  [RP]  1622 Signed out to Dr Oswald Hillock. After home meds BP is still elevated so was ordered for labetalol 20 mg IV. CTA did not show evidence of PE but appears to be consistent with CHFe 2/2 his pleural effusions. Second troponin elevated. Feel that patient will likely require admission.  [RP]  4403 Stable 72 YOM with CHF coming in with chest pain and SOB.  Hypertensive emergency. [CC]    Clinical Course User Index [CC] Tretha Sciara, MD [RP] Fransico Meadow, MD                           Medical Decision Making Amount and/or Complexity of Data Reviewed Labs: ordered. Radiology: ordered. Decision-making details documented in ED Course.  Risk Prescription drug management.   Albert Powell is a 81 year old male with a history of HTN, CAD, CHF, COPD presents to the emergency department with shortness of breath.  Initial Ddx:  Heart failure exacerbation, MI, pulmonary embolism, hypertensive emergency, pneumonia  MDM:  Feel that based on patient's current exam he is likely experiencing a heart failure exacerbation.  This is likely worsened by his hypertension at this time.  Appears that prior visits the patient has been approximately 150.  Did not take his home blood pressure medications this morning so we will give these to him at this time to see if this improves his BP.  Also on the differential would be MI with his risk factors.  Will obtain testing to evaluate for pulmonary embolism.  No infectious symptoms at this time to suggest pneumonia so feel this is less likely  Plan:  Labs Troponin D-dimer BNP COVID/flu Chest x-ray EKG  ED Summary:  Patient underwent the following work-up which showed an initial troponin that was WNL but elevation on subsequent troponin that was 37.  BNP was 3500 which is similar to priors.  D-dimer was elevated so CTA was obtained that did not show evidence of pulmonary embolism but did show bilateral pleural effusions right greater than left.  Feel this is likely due to the patient's heart failure and he is having an exacerbation at this time.  Given the patient's home medication without significant improvement of his blood pressure so the change of shift was also given a dose of labetalol.  Give him Lasix and potassium for diuresis.  The patient was a signed out to Dr. Oswald Hillock who will attempt  additional blood pressure control and admission to the hospital.  Dispo: Pending   Additional history obtained from spouse Records reviewed OP Notes and DC Summary The following labs were independently interpreted: Chemistry and Serial Troponins I independently visualized the following imaging with scope of interpretation limited to determining acute life threatening conditions related to emergency care: Chest x-ray, which revealed  R sided pleural effusion    Final Clinical Impression(s) / ED Diagnoses Final diagnoses:  Acute on chronic congestive heart failure, unspecified heart failure type ([place])  Shortness of breath  NSTEMI (non-ST elevated myocardial infarction) [facility])  Essential hypertension  Hypertensive emergency    Rx / DC Orders ED Discharge Orders     None         Fransico Meadow, MD 06/02/22 1738

## 2022-06-02 NOTE — ED Notes (Signed)
SBAR report given to [place], RN at this time.

## 2022-06-02 NOTE — ED Notes (Signed)
Pt had bowel movement at this time, loose brown stool

## 2022-06-02 NOTE — ED Notes (Signed)
Patient, paperwork, belongings and nitro Gtt transported to [facility] at this time via [facility]

## 2022-06-02 NOTE — ED Notes (Signed)
Pt. BP does not meet criteria for nitro gtt. Start.

## 2022-06-02 NOTE — ED Provider Notes (Signed)
Care patient received from prior provider at 1600.  Please see their note for initial H&P and physical exam. In short, this is a 81 year old male presenting as below: Clinical Course as of 06/02/22 2319  Mon Jun 02, 2022  1404 DG Chest 2 View IMPRESSION: 1. Mild bilateral interstitial thickening which may reflect interstitial edema versus infection. [RP]  4782 CXR on my read also shows R sided pleural effusion.  [RP]  1622 Signed out to Dr Oswald Hillock. After home meds BP is still elevated so was ordered for labetalol 20 mg IV. CTA did not show evidence of PE but appears to be consistent with CHFe 2/2 his pleural effusions. Second troponin elevated. Feel that patient will likely require admission.  [RP]  9562 Stable 88 YOM with CHF coming in with chest pain and SOB. Hypertensive emergency. [CC]    Clinical Course User Index [CC] Tretha Sciara, MD [RP] Fransico Meadow, MD   Patient's blood pressure in the emergency department with multiple doses of IV antihypertensives.  Communicated with hospitalist team who accepted in admission.  Ultimately had upgrade patient to IV nitro drip to control hypertensive emergency.  Patient ultimately received a ready bed prior to mixing of nitro drip and last blood pressure in emergency department 130Q systolic.  Do not favor that holding patient for completion of nitro drip would be beneficial given current hemodynamic stability and relatively short transfer time.  Patient transferred to outside hospital with no further acute events.   Tretha Sciara, MD 06/02/22 2320

## 2022-06-02 NOTE — ED Triage Notes (Signed)
Arrived POV; accompanied by wife; c/o dry heaving, nausea and cough since last night;

## 2022-06-03 ENCOUNTER — Inpatient Hospital Stay (HOSPITAL_COMMUNITY): Payer: Medicare Other

## 2022-06-03 ENCOUNTER — Encounter (HOSPITAL_COMMUNITY): Payer: Self-pay | Admitting: Internal Medicine

## 2022-06-03 DIAGNOSIS — I4819 Other persistent atrial fibrillation: Secondary | ICD-10-CM

## 2022-06-03 DIAGNOSIS — I5041 Acute combined systolic (congestive) and diastolic (congestive) heart failure: Secondary | ICD-10-CM

## 2022-06-03 DIAGNOSIS — I161 Hypertensive emergency: Secondary | ICD-10-CM | POA: Diagnosis not present

## 2022-06-03 LAB — BRAIN NATRIURETIC PEPTIDE: B Natriuretic Peptide: 2826.1 pg/mL — ABNORMAL HIGH (ref 0.0–100.0)

## 2022-06-03 LAB — CBC
HCT: 40.6 % (ref 39.0–52.0)
Hemoglobin: 13.4 g/dL (ref 13.0–17.0)
MCH: 30 pg (ref 26.0–34.0)
MCHC: 33 g/dL (ref 30.0–36.0)
MCV: 91 fL (ref 80.0–100.0)
Platelets: 247 10*3/uL (ref 150–400)
RBC: 4.46 MIL/uL (ref 4.22–5.81)
RDW: 14.2 % (ref 11.5–15.5)
WBC: 9.9 10*3/uL (ref 4.0–10.5)
nRBC: 0 % (ref 0.0–0.2)

## 2022-06-03 LAB — BASIC METABOLIC PANEL
Anion gap: 10 (ref 5–15)
BUN: 12 mg/dL (ref 8–23)
CO2: 26 mmol/L (ref 22–32)
Calcium: 8.8 mg/dL — ABNORMAL LOW (ref 8.9–10.3)
Chloride: 102 mmol/L (ref 98–111)
Creatinine, Ser: 1.45 mg/dL — ABNORMAL HIGH (ref 0.61–1.24)
GFR, Estimated: 49 mL/min — ABNORMAL LOW (ref >60)
Glucose, Bld: 145 mg/dL — ABNORMAL HIGH (ref 70–99)
Potassium: 3.3 mmol/L — ABNORMAL LOW (ref 3.5–5.1)
Sodium: 138 mmol/L (ref 135–145)

## 2022-06-03 LAB — GLUCOSE, CAPILLARY
Glucose-Capillary: 241 mg/dL — ABNORMAL HIGH (ref 70–99)
Glucose-Capillary: 98 mg/dL (ref 70–99)
Glucose-Capillary: 138 mg/dL — ABNORMAL HIGH (ref 70–99)
Glucose-Capillary: 134 mg/dL — ABNORMAL HIGH (ref 70–99)

## 2022-06-03 LAB — MAGNESIUM
Magnesium: 1.9 mg/dL (ref 1.7–2.4)
Magnesium: 2 mg/dL (ref 1.7–2.4)

## 2022-06-03 LAB — TROPONIN I (HIGH SENSITIVITY): Troponin I (High Sensitivity): 42 ng/L — ABNORMAL HIGH (ref <18)

## 2022-06-03 MED ORDER — CARVEDILOL 6.25 MG PO TABS
6.2500 mg | ORAL_TABLET | Freq: Two times a day (BID) | ORAL | Status: DC
  Administered 2022-06-03 – 2022-06-04 (×3): 6.25 mg via ORAL
  Filled 2022-06-03 (×3): qty 1

## 2022-06-03 MED ORDER — HYDRALAZINE HCL 100 MG PO TABS: 50.0000 mg | ORAL_TABLET | Freq: Three times a day (TID) | ORAL | Status: DC

## 2022-06-03 MED ORDER — SPIRONOLACTONE 25 MG PO TABS
25.0000 mg | ORAL_TABLET | Freq: Every day | ORAL | Status: DC
  Administered 2022-06-03 – 2022-06-06 (×4): 25 mg via ORAL
  Filled 2022-06-03 (×4): qty 1

## 2022-06-03 MED ORDER — INSULIN ASPART 100 UNIT/ML IJ SOLN
0.0000 [IU] | Freq: Every day | INTRAMUSCULAR | Status: DC
  Administered 2022-06-04: 2 [IU] via SUBCUTANEOUS

## 2022-06-03 MED ORDER — CLOPIDOGREL BISULFATE 75 MG PO TABS
75.0000 mg | ORAL_TABLET | Freq: Every day | ORAL | Status: DC
  Administered 2022-06-03 – 2022-06-06 (×4): 75 mg via ORAL
  Filled 2022-06-03 (×4): qty 1

## 2022-06-03 MED ORDER — ACETAMINOPHEN 325 MG PO TABS: 650.0000 mg | ORAL_TABLET | Freq: Four times a day (QID) | ORAL | Status: DC | PRN

## 2022-06-03 MED ORDER — ATORVASTATIN CALCIUM 40 MG PO TABS
40.0000 mg | ORAL_TABLET | Freq: Every day | ORAL | Status: DC
  Administered 2022-06-03 – 2022-06-06 (×4): 40 mg via ORAL
  Filled 2022-06-03 (×4): qty 1

## 2022-06-03 MED ORDER — POTASSIUM CHLORIDE CRYS ER 20 MEQ PO TBCR
40.0000 meq | EXTENDED_RELEASE_TABLET | Freq: Every day | ORAL | Status: DC
  Administered 2022-06-03 – 2022-06-06 (×4): 40 meq via ORAL
  Filled 2022-06-03 (×4): qty 2

## 2022-06-03 MED ORDER — ACETAMINOPHEN 650 MG RE SUPP: 650.0000 mg | Freq: Four times a day (QID) | RECTAL | Status: DC | PRN

## 2022-06-03 MED ORDER — LEVOTHYROXINE SODIUM 25 MCG PO TABS
25.0000 ug | ORAL_TABLET | Freq: Every day | ORAL | Status: DC
  Administered 2022-06-03 – 2022-06-06 (×4): 25 ug via ORAL
  Filled 2022-06-03 (×4): qty 1

## 2022-06-03 MED ORDER — APIXABAN 5 MG PO TABS
5.0000 mg | ORAL_TABLET | Freq: Two times a day (BID) | ORAL | Status: DC
  Administered 2022-06-03 – 2022-06-06 (×7): 5 mg via ORAL
  Filled 2022-06-03 (×7): qty 1

## 2022-06-03 MED ORDER — INSULIN ASPART 100 UNIT/ML IJ SOLN
0.0000 [IU] | Freq: Three times a day (TID) | INTRAMUSCULAR | Status: DC
  Administered 2022-06-03: 5 [IU] via SUBCUTANEOUS
  Administered 2022-06-04: 2 [IU] via SUBCUTANEOUS
  Administered 2022-06-04 – 2022-06-05 (×3): 1 [IU] via SUBCUTANEOUS
  Administered 2022-06-05: 2 [IU] via SUBCUTANEOUS
  Administered 2022-06-06 (×2): 1 [IU] via SUBCUTANEOUS

## 2022-06-03 MED ORDER — FUROSEMIDE 10 MG/ML IJ SOLN
40.0000 mg | Freq: Two times a day (BID) | INTRAMUSCULAR | Status: DC
  Administered 2022-06-03 – 2022-06-04 (×3): 40 mg via INTRAVENOUS
  Filled 2022-06-03 (×3): qty 4

## 2022-06-03 MED ORDER — ISOSORBIDE MONONITRATE ER 60 MG PO TB24
60.0000 mg | ORAL_TABLET | Freq: Every day | ORAL | Status: DC
  Administered 2022-06-03 – 2022-06-06 (×4): 60 mg via ORAL
  Filled 2022-06-03 (×4): qty 1

## 2022-06-03 MED ORDER — HYDRALAZINE HCL 50 MG PO TABS
100.0000 mg | ORAL_TABLET | Freq: Three times a day (TID) | ORAL | Status: DC
  Administered 2022-06-03 – 2022-06-06 (×10): 100 mg via ORAL
  Filled 2022-06-03 (×10): qty 2

## 2022-06-03 MED ORDER — POTASSIUM CHLORIDE CRYS ER 20 MEQ PO TBCR
40.0000 meq | EXTENDED_RELEASE_TABLET | Freq: Once | ORAL | Status: AC
  Administered 2022-06-03: 40 meq via ORAL
  Filled 2022-06-03: qty 2

## 2022-06-03 MED ORDER — PANTOPRAZOLE SODIUM 40 MG PO TBEC
40.0000 mg | DELAYED_RELEASE_TABLET | ORAL | Status: DC
  Administered 2022-06-03 – 2022-06-05 (×2): 40 mg via ORAL
  Filled 2022-06-03 (×4): qty 1

## 2022-06-03 NOTE — Evaluation (Signed)
Physical Therapy Evaluation Patient Details Name: Albert Powell MRN: [id number] DOB: October 18, 1940 Today's Date: 06/03/2022  History of Present Illness  81 yo male presents to [facility] on 9/18 with nausea, cough, ShoB, HTN due to acute on chronic HF. CTA shows  moderate right and mild left pleural effusions with associated bilateral lower lobe compressive atelectasis. Recent admission 8/29 for CHF exacerbation. PMH includes: CAD, chronic diastolic CHF, essential hypertension, hyperlipidemia, A-fib, COPD and T2DM  Clinical Impression   Pt presents with generalized weakness, impaired balance, dyspnea on exertion with accompanying desats, and impaired activity tolerance. Pt to benefit from acute PT to address deficits. Pt ambulated hallway distance, HR in afib rhythm with max observed 120s bpm and SpO2 86-95% on 2LO2. PT to progress mobility as tolerated, and will continue to follow acutely.         Recommendations for follow up therapy are one component of a multi-disciplinary discharge planning process, led by the attending physician.  Recommendations may be updated based on patient status, additional functional criteria and insurance authorization.  Follow Up Recommendations Home health PT      Assistance Recommended at Discharge Intermittent Supervision/Assistance  Patient can return home with the following  A little help with walking and/or transfers;A little help with bathing/dressing/bathroom;Assistance with feeding;Assist for transportation;Help with stairs or ramp for entrance    Equipment Recommendations None recommended by PT  Recommendations for Other Services       Functional Status Assessment Patient has had a recent decline in their functional status and demonstrates the ability to make significant improvements in function in a reasonable and predictable amount of time.     Precautions / Restrictions Precautions Precautions: Fall Precaution Comments: watch HR and  O2 Restrictions Weight Bearing Restrictions: No      Mobility  Bed Mobility Overal bed mobility: Needs Assistance Bed Mobility: Supine to Sit, Sit to Supine     Supine to sit: Supervision Sit to supine: Supervision   General bed mobility comments: HOB raised and increased time    Transfers Overall transfer level: Needs assistance Equipment used: Rolling walker (2 wheels) Transfers: Sit to/from Stand Sit to Stand: Min guard                Ambulation/Gait Ambulation/Gait assistance: Min guard Gait Distance (Feet): 160 Feet Assistive device: Rolling walker (2 wheels) Gait Pattern/deviations: Step-through pattern, Decreased stride length Gait velocity: decr     General Gait Details: cues for upright posture, keeping RW close. SpO27min 86% on 2LO2, quickly recovered >88% with pursed lip breathing technique cues  Stairs            Wheelchair Mobility    Modified Rankin (Stroke Patients Only)       Balance Overall balance assessment: Needs assistance Sitting-balance support: No upper extremity supported, Feet supported Sitting balance-Leahy Scale: Fair     Standing balance support: Bilateral upper extremity supported, During functional activity Standing balance-Leahy Scale: Poor Standing balance comment: reliant on external support                             Pertinent Vitals/Pain Pain Assessment Pain Assessment: No/denies pain    Home Living Family/patient expects to be discharged to:: Private residence Living Arrangements: Spouse/significant other Available Help at Discharge: Family;Available 24 hours/day Type of Home: Apartment Home Access: Level entry       Home Layout: One level Home Equipment: Rollator (4 wheels);Shower seat;Cane - single point  Prior Function Prior Level of Function : Driving;Independent/Modified Independent             Mobility Comments: Uses rollator for mobility vs no AD ADLs Comments:  reports independence     Hand Dominance   Dominant Hand: Right    Extremity/Trunk Assessment   Upper Extremity Assessment Upper Extremity Assessment: Defer to OT evaluation    Lower Extremity Assessment Lower Extremity Assessment: Generalized weakness    Cervical / Trunk Assessment Cervical / Trunk Assessment: Normal  Communication   Communication: No difficulties  Cognition Arousal/Alertness: Awake/alert Behavior During Therapy: WFL for tasks assessed/performed Overall Cognitive Status: Within Functional Limits for tasks assessed                                          General Comments General comments (skin integrity, edema, etc.): HR in afib rhythm, 80-120s bpm    Exercises     Assessment/Plan    PT Assessment Patient needs continued PT services  PT Problem List Decreased strength;Decreased activity tolerance;Decreased mobility;Decreased balance;Decreased knowledge of use of DME;Cardiopulmonary status limiting activity       PT Treatment Interventions DME instruction;Gait training;Stair training;Functional mobility training;Therapeutic activities;Balance training;Patient/family education    PT Goals (Current goals can be found in the Care Plan section)  Acute Rehab PT Goals Patient Stated Goal: back home PT Goal Formulation: With patient Time For Goal Achievement: 06/17/22 Potential to Achieve Goals: Good    Frequency Min 3X/week     Co-evaluation               AM-PAC PT "6 Clicks" Mobility  Outcome Measure Help needed turning from your back to your side while in a flat bed without using bedrails?: A Little Help needed moving from lying on your back to sitting on the side of a flat bed without using bedrails?: A Little Help needed moving to and from a bed to a chair (including a wheelchair)?: A Little Help needed standing up from a chair using your arms (e.g., wheelchair or bedside chair)?: A Little Help needed to walk in  hospital room?: A Little Help needed climbing 3-5 steps with a railing? : A Little 6 Click Score: 18    End of Session Equipment Utilized During Treatment: Oxygen Activity Tolerance: Patient tolerated treatment well Patient left: in bed;with call bell/phone within reach;with bed alarm set Nurse Communication: Mobility status PT Visit Diagnosis: Unsteadiness on feet (R26.81);Difficulty in walking, not elsewhere classified (R26.2)    Time: 2993-7169 PT Time Calculation (min) (ACUTE ONLY): 20 min   Charges:   PT Evaluation $PT Eval Low Complexity: 1 Low         Stacie Glaze, PT DPT Acute Rehabilitation Services Pager [phone]  Office [phone]  Louis Matte 06/03/2022, 4:43 PM

## 2022-06-03 NOTE — Consult Note (Signed)
Cardiology Consultation   Patient ID: Albert Powell MRN: [id number]; DOB: Oct 26, 1940  Admit date: 06/02/2022 Date of Consult: 06/03/2022  PCP:  Leeroy Cha, [place] [facility] Cardiologist:  Quay Burow, MD        Patient Profile:   Albert Powell is a 81 y.o. male with a hx of CAD s/p RCA stent in 2003, Chronic Heart Failure with EF of 25-30% who is being seen 06/03/2022 for the evaluation of acute on chronic diastolic failure and atrial fibrillation at the request of hospitalist.  History of Present Illness:   Albert Powell is an 81 year old male with a Hx of CAD s/p RCA stent in 2003, and chronic heart failure. He presented to the emergency department with complaints of shortness of breath for the last few days. He denies any chest pain, and does not use oxygen at home. On presentation in the emergency department, systolic blood pressure was noted to be in the 200s, and pulse was in the 110s secondary to Afib w/ RVR. Of note, he had a previous admission earlier this month for similar symptoms. ECHO at the time showed a reduced EF of 25-30% with new regional wall abnormalities. RV maintains function. R and L Heart cath was done and showed mild to moderate obstruction, and previous stent still patent. At the time, he was diuresed and started on farxiga, metoprolol, spironolactone, and imdur.    Past Medical History:  Diagnosis Date   Atrial fibrillation ([place]) 03/30/2020   Bradycardia, sinus 02/18/2013   CAD (coronary artery disease)    stent to RCA 2003 also had 40% lesion at that time; NUCLEAR STRESS TEST, 01/16/2010 - normal  now with cath 80-90% stenosis in LAD, will try medical therapy if no improvement PCI   Carotid artery disease ([name])    Cataract    CHF (congestive heart failure) ([place])    Claudication ([place])    LEA DUPLEX, 07/07/2008 - Normal   COPD (chronic obstructive pulmonary disease) ([place])    DDD (degenerative disc disease) 2008    Diabetes mellitus    GERD (gastroesophageal reflux disease)    H/O hiatal hernia    History of colonoscopy 2004   finding of tics and AMV only no polpys   Hyperlipidemia 02/05/2013   Hypertension    Onychomycosis    Rosacea    Tobacco abuse 02/05/2013    Past Surgical History:  Procedure Laterality Date   ABDOMINAL AORTOGRAM W/LOWER EXTREMITY N/A 03/30/2018   Procedure: ABDOMINAL AORTOGRAM W/LOWER EXTREMITY;  Surgeon: Serafina Mitchell, MD;  Location: [place] [facility];  Service: Cardiovascular;  Laterality: N/A;   ABDOMINAL AORTOGRAM W/LOWER EXTREMITY Bilateral 08/27/2020   Procedure: ABDOMINAL AORTOGRAM W/LOWER EXTREMITY;  Surgeon: Lorretta Harp, MD;  Location: [place] [facility];  Service: Cardiovascular;  Laterality: Bilateral;   APPENDECTOMY     CARDIAC CATHETERIZATION  08/29/2002   2-vessel CAD with high-grade stenoses in RCA; stenting to RCA   CARDIAC CATHETERIZATION  2014   80-90% lesion will try medical therapy   CARDIAC SURGERY     stents  2003   CARDIOVERSION N/A 11/01/2013   Procedure: Albert Powell COMPRESSION;  Surgeon: Lorretta Harp, MD;  Location: [facility];  Service: Cardiovascular;  Laterality: N/A;   CAROTID ANGIOGRAM N/A 04/25/2013   Procedure: CAROTID ANGIOGRAM;  Surgeon: Lorretta Harp, MD;  Location: [facility] CATH LAB;  Service: Cardiovascular;  Laterality: N/A;   CAROTID ENDARTERECTOMY     COLON RESECTION  3/04, 6/04    1 bleeding diverticulitis, 2 complete colectomy   COLON SURGERY  2005   renal pouch rectal anastimosis   CORONARY ANGIOPLASTY WITH STENT PLACEMENT  09/2002   2 stents to RCA   ENDARTERECTOMY Left 06/02/2013   Procedure: ENDARTERECTOMY CAROTID-LEFT;  Surgeon: Serafina Mitchell, MD;  Location: [place];  Service: Vascular;  Laterality: Left;   INTRAVASCULAR PRESSURE WIRE/FFR STUDY N/A 04/02/2020   Procedure: INTRAVASCULAR PRESSURE WIRE/FFR STUDY;  Surgeon: Lorretta Harp, MD;  Location: [place] [facility];  Service: Cardiovascular;   Laterality: N/A;  DFR - RCA   LEFT HEART CATH AND CORONARY ANGIOGRAPHY N/A 04/02/2020   Procedure: LEFT HEART CATH AND CORONARY ANGIOGRAPHY;  Surgeon: Lorretta Harp, MD;  Location: [place] [facility];  Service: Cardiovascular;  Laterality: N/A;   LEFT HEART CATHETERIZATION WITH CORONARY ANGIOGRAM N/A 02/08/2013   Procedure: LEFT HEART CATHETERIZATION WITH CORONARY ANGIOGRAM;  Surgeon: Lorretta Harp, MD;  Location: [facility] CATH LAB;  Service: Cardiovascular;  Laterality: N/A;   LOWER EXTREMITY ANGIOGRAM N/A 10/20/2013   Procedure: LOWER EXTREMITY ANGIOGRAM;  Surgeon: Lorretta Harp, MD;  Location: [facility] CATH LAB;  Service: Cardiovascular;  Laterality: N/A;   PATCH ANGIOPLASTY Left 06/02/2013   Procedure: PATCH ANGIOPLASTY;  Surgeon: Serafina Mitchell, MD;  Location: [facility];  Service: Vascular;  Laterality: Left;   PERIPHERAL VASCULAR INTERVENTION Bilateral 03/30/2018   Procedure: PERIPHERAL VASCULAR INTERVENTION;  Surgeon: Serafina Mitchell, MD;  Location: [place] [facility];  Service: Cardiovascular;  Laterality: Bilateral;  common iliacs   RIGHT/LEFT HEART CATH AND CORONARY ANGIOGRAPHY N/A 05/20/2022   Procedure: RIGHT/LEFT HEART CATH AND CORONARY ANGIOGRAPHY;  Surgeon: Early Osmond, MD;  Location: [place] [facility];  Service: Cardiovascular;  Laterality: N/A;   TONSILLECTOMY         Inpatient Medications: Scheduled Meds:  apixaban  5 mg Oral BID   atorvastatin  40 mg Oral Daily   carvedilol  6.25 mg Oral BID WC   clopidogrel  75 mg Oral Daily   furosemide  40 mg Intravenous BID   hydrALAZINE  100 mg Oral Q8H   insulin aspart  0-5 Units Subcutaneous QHS   insulin aspart  0-9 Units Subcutaneous TID WC   isosorbide mononitrate  60 mg Oral Daily   levothyroxine  25 mcg Oral Q0600   pantoprazole  40 mg Oral QODAY   potassium chloride  40 mEq Oral Daily   spironolactone  25 mg Oral Daily   Continuous Infusions:  nitroGLYCERIN 35 mcg/min (06/03/22 0900)   PRN Meds: acetaminophen **OR**  acetaminophen  Allergies:    Allergies  Allergen Reactions   Fentanyl Other (See Comments)    Behavioral changes   Gabapentin Other (See Comments)    ankle swells   Lisinopril Other (See Comments)    unknown   Lyrica [Pregabalin] Other (See Comments)    unknown   Metformin Diarrhea   Propofol Other (See Comments)    Heart rate dropped    Social History:   Social History   Socioeconomic History   Marital status: Married    Spouse name: Lynk Marti   Number of children: 1   Years of education: Not on file   Highest education level: High school graduate  Occupational History   Occupation: retired  Tobacco Use   Smoking status: Every Day    Packs/day: 1.50    Years: 57.00    Total pack years: 85.50    Types: Cigarettes, E-cigarettes  Smokeless tobacco: Never   Tobacco comments:    pt states that he is using the vapor cigs--12/2021  Vaping Use   Vaping Use: Former   Start date: 09/15/2016   Substances: Nicotine  Substance and Sexual Activity   Alcohol use: Yes    Alcohol/week: 1.0 standard drink of alcohol    Types: 1 Glasses of wine per week    Comment: "very little"   Drug use: No   Sexual activity: Not on file  Other Topics Concern   Not on file  Social History Narrative   Not on file   Social Determinants of Health   Financial Resource Strain: Low Risk  (05/15/2022)   Overall Financial Resource Strain (CARDIA)    Difficulty of Paying Living Expenses: Not hard at all  Food Insecurity: No Food Insecurity (05/15/2022)   Hunger Vital Sign    Worried About Running Out of Food in the Last Year: Never true    Ran Out of Food in the Last Year: Never true  Transportation Needs: No Transportation Needs (05/15/2022)   PRAPARE - Hydrologist (Medical): No    Lack of Transportation (Non-Medical): No  Physical Activity: Not on file  Stress: Not on file  Social Connections: Not on file  Intimate Partner Violence: Not on file     Family History:   Family History  Problem Relation Age of Onset   Heart disease Father        heart attack at 79   Heart attack Father    Hyperlipidemia Father    Colonic polyp Mother        that bled out   COPD Mother    Diabetes Mother    Heart disease Sister    Colonic polyp Sister    Stroke Maternal Grandfather    Heart attack Paternal Grandfather    Other Neg Hx        hypogonadism     ROS:  Please see the history of present illness.  All other ROS reviewed and negative.     Physical Exam/Data:   Vitals:   06/03/22 0530 06/03/22 0605 06/03/22 0630 06/03/22 0855  BP: (!) 182/73 (!) 184/99 (!) 158/80 (!) 145/75  Pulse: 69 78 65 70  Resp: 20 20 20  (!) 23  Temp:    97.8 F (36.6 C)  TempSrc:    Oral  SpO2: 93% 96% 93% 91%  Weight:      Height:        Intake/Output Summary (Last 24 hours) at 06/03/2022 1045 Last data filed at 06/03/2022 0600 Gross per 24 hour  Intake --  Output 600 ml  Net -600 ml      06/03/2022    5:00 AM 06/02/2022    1:05 PM 05/21/2022    4:14 AM  Last 3 Weights  Weight (lbs) 218 lb 7.6 oz 223 lb 223 lb  Weight (kg) 99.1 kg 101.152 kg 101.152 kg     Body mass index is 31.35 kg/m.  General:  Elderly, obese gentleman, in no acute distress HEENT: normal Neck: no JVD appreciated  Vascular: No carotid bruits; Distal pulses 2+ bilaterally Cardiac:  Irregular irregular rhythm and rate Lungs:  R Lower lobe dull to percussion, mild crackles on auscultation Abd: soft, nontender, no hepatomegaly  Ext: no edema Musculoskeletal:  No deformities, BUE and BLE strength normal and equal Skin: warm and dry  Neuro:  CNs 2-12 intact, no focal abnormalities noted Psych:  Normal affect  EKG:  The EKG was personally reviewed and demonstrates:  Atrial fibrillation with RVR Telemetry:  Telemetry was personally reviewed and demonstrates:  Persistent atrial fibrillation  Relevant CV Studies: Limited ECHO FINDINGS on 05/14/22   Left Ventricle: Left  ventricular ejection fraction, by estimation, is 25  to 30%. The left ventricle has severely decreased function. The left  ventricle demonstrates regional wall motion abnormalities. Severe  hypokinesis of the left ventricular,  basal-mid inferoseptal wall, inferior wall and anterolateral wall. Mild  hypokinesis of the left ventricular, entire anteroseptal wall, anterior  wall and apical segment. The left ventricular internal cavity size was  normal in size. There is no left  ventricular hypertrophy.   Right Ventricle: The right ventricular size is normal. No increase in  right ventricular wall thickness. Right ventricular systolic function is  normal. Tricuspid regurgitation signal is inadequate for assessing PA  pressure.   R+L Heart Cath done on 9/5   Prox LAD to Mid LAD lesion is 40% stenosed.   Prox Cx to Mid Cx lesion is 30% stenosed.   Prox RCA to Mid RCA lesion is 60% stenosed.   1st Diag lesion is 60% stenosed.   Dist LAD lesion is 40% stenosed.   Non-stenotic Ost RCA to Prox RCA lesion was previously treated.   1.  Mild to moderate obstructive coronary artery disease; compared with the previous study the angiographic appearance looks relatively improved.  Overall the burden of coronary artery disease does not explain the patient's severe cardiomyopathy.   2.  Mean RA pressure of 5 mmHg and mean wedge pressure of 5 mmHg with normal cardiac output of 5.9 L/min and cardiac index of 2.7 L/min/m.   Laboratory Data:  High Sensitivity Troponin:   Recent Labs  Lab 05/13/22 2131 06/02/22 1313 06/02/22 1539 06/02/22 1834 06/02/22 2312  TROPONINIHS 85* <2 37* 35* 42*     Chemistry Recent Labs  Lab 06/02/22 1313 06/03/22 0254 06/03/22 0606  NA 136 138  --   K 3.2* 3.3*  --   CL 99 102  --   CO2 24 26  --   GLUCOSE 163* 145*  --   BUN 14 12  --   CREATININE 1.44* 1.45*  --   CALCIUM 9.5 8.8*  --   MG  --  1.9 2.0  GFRNONAA 49* 49*  --   ANIONGAP 13 10  --     No  results for input(s): "PROT", "ALBUMIN", "AST", "ALT", "ALKPHOS", "BILITOT" in the last 168 hours. Lipids No results for input(s): "CHOL", "TRIG", "HDL", "LABVLDL", "LDLCALC", "CHOLHDL" in the last 168 hours.  Hematology Recent Labs  Lab 06/02/22 1313 06/03/22 0254  WBC 11.4* 9.9  RBC 4.84 4.46  HGB 14.7 13.4  HCT 43.6 40.6  MCV 90.1 91.0  MCH 30.4 30.0  MCHC 33.7 33.0  RDW 14.1 14.2  PLT 293 247   Thyroid No results for input(s): "TSH", "FREET4" in the last 168 hours.  BNP Recent Labs  Lab 06/02/22 1313 06/03/22 0615  BNP 3,560.0* 2,826.1*    DDimer  Recent Labs  Lab 06/02/22 1313  DDIMER 1.22*     Radiology/Studies:  DG Chest Port 1 View  Result Date: 06/03/2022 CLINICAL DATA:  Shortness of breath hypertension EXAM: PORTABLE CHEST 1 VIEW COMPARISON:  06/02/2022 FINDINGS: Hazy appearance of the bilateral lower chest attributed to layering pleural fluid with atelectasis, also seen on a chest CT from yesterday. Normal heart size. Spinal cord stimulator over the midthoracic spine. IMPRESSION: Hazy opacification  of the lower chest from atelectasis and pleural fluid by CT yesterday. Electronically Signed   By: Jorje Guild M.D.   On: 06/03/2022 06:44   CT Angio Chest PE W and/or Wo Contrast  Result Date: 06/02/2022 CLINICAL DATA:  Dyspnea on exertion. Cough. Evaluate for pulmonary embolism. History of congestive heart failure, cardiac catheterization/stent, and diabetes. EXAM: CT ANGIOGRAPHY CHEST WITH CONTRAST TECHNIQUE: Multidetector CT imaging of the chest was performed using the standard protocol during bolus administration of intravenous contrast. Multiplanar CT image reconstructions and MIPs were obtained to evaluate the vascular anatomy. RADIATION DOSE REDUCTION: This exam was performed according to the departmental dose-optimization program which includes automated exposure control, adjustment of the mA and/or kV according to patient size and/or use of iterative  reconstruction technique. CONTRAST:  66mL OMNIPAQUE IOHEXOL 350 MG/ML SOLN COMPARISON:  Chest two views 06/02/2022 and 05/14/2022 FINDINGS: Cardiovascular: The main pulmonary artery is opacified up to 421 Hounsfield units. No filling defect is seen to indicate an acute pulmonary embolism. No thoracic aortic aneurysm. Dense coronary artery calcifications. High-grade atherosclerotic calcifications throughout the thoracic aorta. Mediastinum/Nodes: No axillary lymphadenopathy. There are scattered subcentimeter short axis prevascular and right paratracheal lymph nodes. 11 mm short axis precarinal lymph node, likely reactive (axial series 5, image 130). 14 mm short axis subcarinal lymph node (axial series 5, image 155, coronal series 7, image 79), likely reactive. Mild right hilar likely lymph node soft tissue thickening. The visualized thyroid is unremarkable. Minimal diffuse esophageal wall thickening may be secondary to chronic inflammation. No focal mass is seen. Lungs/Pleura: The central airways are patent. Mild centrilobular emphysematous changes predominantly within the bilateral upper lungs. Moderate right and mild left pleural effusions with associated bilateral lower lobe compressive atelectasis. Upper Abdomen: There is moderate left and right adrenal gland thickening. There is low-density approximate 11 mm left adrenal nodule (axial series 4 images [id number]) with central low density of -13 Hounsfield units suggesting a benign lipid rich adenoma. Minimally partially visualized likely fluid density cyst within the upper pole of the left kidney. The rest of the left kidney itself is not imaged. No follow-up imaging recommended. Musculoskeletal: Moderate multilevel degenerative disc changes of the thoracic spine. Severe C6-7 disc space narrowing. Mild retrolisthesis of C6 on C7. Moderate superior L1 and mild superior T10 vertebral body degenerative Schmorl's nodes. A spinal cord stimulator electrode is seen within  the posterior aspect of the central canal at the T7 through T9 levels. Review of the MIP images confirms the above findings. IMPRESSION: 1. No acute pulmonary embolism is seen. 2. Moderate right and mild left pleural effusions with associated bilateral lower lobe compressive atelectasis. 3. Mildly enlarged precarinal lymph node, likely reactive. Aortic Atherosclerosis (ICD10-I70.0) and Emphysema (ICD10-J43.9). Electronically Signed   By: Yvonne Kendall M.D.   On: 06/02/2022 16:03   DG Chest 2 View  Result Date: 06/02/2022 CLINICAL DATA:  Shortness of breath EXAM: CHEST - 2 VIEW COMPARISON:  05/14/2022 FINDINGS: Mild bilateral interstitial thickening. No focal consolidation. No pleural effusion or pneumothorax. Heart and mediastinal contours are unremarkable. No acute osseous abnormality. Spinal stimulator noted. IMPRESSION: 1. Mild bilateral interstitial thickening which may reflect interstitial edema versus infection. Electronically Signed   By: Kathreen Devoid M.D.   On: 06/02/2022 13:44     Assessment and Plan:   #Acute on Chronic Diastolic Heart Failure secondary to Hypertensive Emergency  Pt's last echocardiogram showed an EF of 25-30% with new regional wall abnormalities. Cath was subsequently performed which showed no severe obstruction. Pt  had a similar presentation at the beginning of this month, and was feeling better after diuresis. This AM, he states his breathing has improved since admission, but reports weakness and SOB on exertion (walking to restroom). Chest X-Ray revealed pleural effusion more on the R than L side, and on PE, mild crackles were heard and dull percussion in the area was appreciated.   Pt is currently on 2L of O2, and is saturating in the low 90 range. He states he does not use oxygen at home. He denies any active dyspnea, however nurse reports that he gets SOB upon exertion.  Pt states he has been compliant with his medication, and states his wife lines up his pills for  him daily. He stated he takes 4-5 pills a day, however his medical regimen includes more than that amount. Poor medication compliance may have led to hypertensive emergency which caused his HF exacerbation. Blood pressure has been improved to 145/75 on most recent logs. Goal is to decrease 25% per 24 hours.   Plan:  - Continue diuresing with Lasix 40mg  IV BID, will re-evaluate tomorrow to assess need for increase in dosage - continue medical regimen of Coreg 6.25 BID, Hydralazine 100mg  TID, Imdur 60mg  - Will D/C Nitro drip  - Would recommend [facility] consult to improve medication compliance upon discharge  -Daily weights, I/O, BMP  - Fluid restricted diet   #Atrial Fibrillation with RVR  Pt continues to be in Atrial fibrillation, and has not spontaneously converted since entering the hospital. His pulse has stabilized around 80. Will continue to monitor on telemetry. His CHADVASC score is currently 6, and he is on anticoagulation with eliquis 5mg   #CAD s/p stent in 2003  Pt currently denies any chest pain. Troponins were mildly elevated upon admissions, but most likely from demand ischemia in the setting of HF exacerbation. He is on Lipitor 40mg , and Plavix 75mg . Continue current medical regimen.     For questions or updates, please contact [place] Please consult [email or web address] for contact info under    Signed, Drucie Opitz, MD  06/03/2022 10:45 AM

## 2022-06-03 NOTE — H&P (Signed)
History and Physical    Albert Powell YIR:[id number] DOB: 07-06-1941 DOA: 06/02/2022  PCP: Leeroy Cha, MD  Patient coming from: [facility]  Chief Complaint: Shortness of breath  HPI: Albert Powell is a 81 y.o. male with medical history significant of A-fib on Eliquis, CAD, chronic combined CHF, COPD, type 2 diabetes, GERD, hypertension, hyperlipidemia, hypothyroidism, obesity, CKD stage III.  Recently admitted for decompensated heart failure and echo done 8/30 showing EF 25 to 30% and regional wall motion abnormalities.  Right/left heart cath done 9/5 showing mild to moderate obstructive CAD.  Patient presented to the ED with shortness of breath.  He was hypertensive with systolic in the 500X and noted to be in A-fib with RVR with rate in the 110s.  Labs showing WBC 11.4, potassium 3.2, glucose 163, creatinine 1.4 (at baseline), high-sensitivity troponin <2 > 37 > 35 > 42, D-dimer 1.22, BNP 3560, COVID and influenza PCR negative.  CTA chest negative for PE.  Showing moderate right and mild left pleural effusions with associated bilateral lower lobe compressive atelectasis. Patient was given IV Lasix 60 mg, doses of IV hydralazine, IV labetalol, oral potassium 60 mEq, and his home antihypertensives including Coreg, hydralazine, and Imdur.  Blood pressure continued to be elevated and subsequently placed on nitroglycerin drip.  Patient reports progressively worsening dyspnea on exertion for the past few days.  Also endorsing orthopnea.  Denies fevers, cough, or chest pain.  He reports taking all of his medications at home including Lasix 40 mg daily.  He watches his dietary sodium intake but is not watching his fluid intake.  He thinks he drinks a lot of fluids.  He has no other complaints.  Denies nausea, vomiting, abdominal pain, or diarrhea.  Review of Systems:  Review of Systems  All other systems reviewed and are negative.   Past Medical History:  Diagnosis Date   Atrial  fibrillation ([place]) 03/30/2020   Bradycardia, sinus 02/18/2013   CAD (coronary artery disease)    stent to RCA 2003 also had 40% lesion at that time; NUCLEAR STRESS TEST, 01/16/2010 - normal  now with cath 80-90% stenosis in LAD, will try medical therapy if no improvement PCI   Carotid artery disease ([name])    Cataract    CHF (congestive heart failure) ([place])    Claudication ([place])    LEA DUPLEX, 07/07/2008 - Normal   COPD (chronic obstructive pulmonary disease) ([place])    DDD (degenerative disc disease) 2008   Diabetes mellitus    GERD (gastroesophageal reflux disease)    H/O hiatal hernia    History of colonoscopy 2004   finding of tics and AMV only no polpys   Hyperlipidemia 02/05/2013   Hypertension    Onychomycosis    Rosacea    Tobacco abuse 02/05/2013    Past Surgical History:  Procedure Laterality Date   ABDOMINAL AORTOGRAM W/LOWER EXTREMITY N/A 03/30/2018   Procedure: ABDOMINAL AORTOGRAM W/LOWER EXTREMITY;  Surgeon: Serafina Mitchell, MD;  Location: [facility];  Service: Cardiovascular;  Laterality: N/A;   ABDOMINAL AORTOGRAM W/LOWER EXTREMITY Bilateral 08/27/2020   Procedure: ABDOMINAL AORTOGRAM W/LOWER EXTREMITY;  Surgeon: Lorretta Harp, MD;  Location: [facility];  Service: Cardiovascular;  Laterality: Bilateral;   APPENDECTOMY     CARDIAC CATHETERIZATION  08/29/2002   2-vessel CAD with high-grade stenoses in RCA; stenting to RCA   CARDIAC CATHETERIZATION  2014   80-90% lesion will try medical therapy   CARDIAC SURGERY     stents  2003   CARDIOVERSION N/A 11/01/2013   Procedure: [place] COMPRESSION;  Surgeon: Lorretta Harp, MD;  Location: [facility] CATH LAB;  Service: Cardiovascular;  Laterality: N/A;   CAROTID ANGIOGRAM N/A 04/25/2013   Procedure: CAROTID ANGIOGRAM;  Surgeon: Lorretta Harp, MD;  Location: [facility] CATH LAB;  Service: Cardiovascular;  Laterality: N/A;   CAROTID ENDARTERECTOMY     COLON RESECTION  3/04, 6/04    1 bleeding diverticulitis, 2  complete colectomy   COLON SURGERY  2005   renal pouch rectal anastimosis   CORONARY ANGIOPLASTY WITH STENT PLACEMENT  09/2002   2 stents to RCA   ENDARTERECTOMY Left 06/02/2013   Procedure: ENDARTERECTOMY CAROTID-LEFT;  Surgeon: Serafina Mitchell, MD;  Location: [facility];  Service: Vascular;  Laterality: Left;   INTRAVASCULAR PRESSURE WIRE/FFR STUDY N/A 04/02/2020   Procedure: INTRAVASCULAR PRESSURE WIRE/FFR STUDY;  Surgeon: Lorretta Harp, MD;  Location: [facility];  Service: Cardiovascular;  Laterality: N/A;  DFR - RCA   LEFT HEART CATH AND CORONARY ANGIOGRAPHY N/A 04/02/2020   Procedure: LEFT HEART CATH AND CORONARY ANGIOGRAPHY;  Surgeon: Lorretta Harp, MD;  Location: [facility];  Service: Cardiovascular;  Laterality: N/A;   LEFT HEART CATHETERIZATION WITH CORONARY ANGIOGRAM N/A 02/08/2013   Procedure: LEFT HEART CATHETERIZATION WITH CORONARY ANGIOGRAM;  Surgeon: Lorretta Harp, MD;  Location: [facility] CATH LAB;  Service: Cardiovascular;  Laterality: N/A;   LOWER EXTREMITY ANGIOGRAM N/A 10/20/2013   Procedure: LOWER EXTREMITY ANGIOGRAM;  Surgeon: Lorretta Harp, MD;  Location: [facility] CATH LAB;  Service: Cardiovascular;  Laterality: N/A;   PATCH ANGIOPLASTY Left 06/02/2013   Procedure: PATCH ANGIOPLASTY;  Surgeon: Serafina Mitchell, MD;  Location: [facility];  Service: Vascular;  Laterality: Left;   PERIPHERAL VASCULAR INTERVENTION Bilateral 03/30/2018   Procedure: PERIPHERAL VASCULAR INTERVENTION;  Surgeon: Serafina Mitchell, MD;  Location: [place] [facility];  Service: Cardiovascular;  Laterality: Bilateral;  common iliacs   RIGHT/LEFT HEART CATH AND CORONARY ANGIOGRAPHY N/A 05/20/2022   Procedure: RIGHT/LEFT HEART CATH AND CORONARY ANGIOGRAPHY;  Surgeon: Early Osmond, MD;  Location: [place] [facility];  Service: Cardiovascular;  Laterality: N/A;   TONSILLECTOMY       reports that he has been smoking cigarettes and e-cigarettes. He has a 85.50 pack-year smoking history. He has never used  smokeless tobacco. He reports current alcohol use of about 1.0 standard drink of alcohol per week. He reports that he does not use drugs.  Allergies  Allergen Reactions   Fentanyl Other (See Comments)    Behavioral changes   Gabapentin Other (See Comments)    ankle swells   Lisinopril Other (See Comments)    unknown   Lyrica [Pregabalin] Other (See Comments)    unknown   Metformin Diarrhea   Propofol Other (See Comments)    Heart rate dropped    Family History  Problem Relation Age of Onset   Heart disease Father        heart attack at 42   Heart attack Father    Hyperlipidemia Father    Colonic polyp Mother        that bled out   COPD Mother    Diabetes Mother    Heart disease Sister    Colonic polyp Sister    Stroke Maternal Grandfather    Heart attack Paternal Grandfather    Other Neg Hx        hypogonadism    Prior to Admission medications   Medication  Sig Start Date End Date Taking? Authorizing Provider  apixaban (ELIQUIS) 5 MG TABS tablet Take 1 tablet (5 mg total) by mouth 2 (two) times daily. 03/21/21   Katherine Roan, MD  atorvastatin (LIPITOR) 40 MG tablet Take 1 tablet (40 mg total) by mouth daily. 05/22/22 06/21/22  Arrien, Jimmy Picket, MD  carvedilol (COREG) 3.125 MG tablet Take 1 tablet (3.125 mg total) by mouth 2 (two) times daily with a meal. 05/21/22 06/20/22  Arrien, Jimmy Picket, MD  clopidogrel (PLAVIX) 75 MG tablet Take 1 tablet (75 mg total) by mouth daily. 01/04/21   Little Ishikawa, MD  dapagliflozin propanediol (FARXIGA) 10 MG TABS tablet Take 1 tablet (10 mg total) by mouth daily before breakfast. 04/25/21   Katherine Roan, MD  escitalopram (LEXAPRO) 20 MG tablet Take 20 mg by mouth daily. 01/18/21   [provider]  furosemide (LASIX) 40 MG tablet Take 1 tablet (40 mg total) by mouth daily. 05/22/22 06/21/22  Arrien, Jimmy Picket, MD  glipiZIDE (GLUCOTROL) 5 MG tablet Take 5 mg by mouth daily before breakfast.    [provider]  hydrALAZINE (APRESOLINE) 100 MG tablet Take 1 tablet (100 mg total) by mouth 3 (three) times daily. 05/21/22 06/20/22  Arrien, Jimmy Picket, MD  isosorbide mononitrate (IMDUR) 60 MG 24 hr tablet Take 2 tablets (120 mg total) by mouth daily. 05/22/22 06/21/22  Arrien, Jimmy Picket, MD  levothyroxine (SYNTHROID) 25 MCG tablet TAKE ONE TABLET BY MOUTH DAILY BEFORE BREAKFAST 03/13/22   Lorretta Harp, MD  pantoprazole (PROTONIX) 40 MG tablet Take 40 mg by mouth every other day.    [provider]  sacubitril-valsartan (ENTRESTO) 97-103 MG Take 1 tablet by mouth 2 (two) times daily. 12/13/21   Consuelo Pandy, PA-C  spironolactone (ALDACTONE) 25 MG tablet Take 1/2 tablet (12.5 mg total) by mouth daily. 08/09/21 05/13/22  Flora Lipps, MD    Physical Exam: Vitals:   06/03/22 0500 06/03/22 0530 06/03/22 0605 06/03/22 0630  BP: (!) 150/67 (!) 182/73 (!) 184/99 (!) 158/80  Pulse: 69 69 78 65  Resp: 17 20 20 20   Temp:      TempSrc:      SpO2: 93% 93% 96% 93%  Weight: 99.1 kg     Height:        Physical Exam Vitals reviewed.  Constitutional:      General: He is not in acute distress.    Comments: Resting comfortably watching television  HENT:     Head: Normocephalic and atraumatic.  Eyes:     Extraocular Movements: Extraocular movements intact.  Cardiovascular:     Rate and Rhythm: Normal rate. Rhythm irregular.     Pulses: Normal pulses.  Pulmonary:     Effort: Pulmonary effort is normal. No respiratory distress.     Breath sounds: Rales present. No wheezing.  Abdominal:     General: Bowel sounds are normal.     Palpations: Abdomen is soft.     Tenderness: There is no abdominal tenderness.  Musculoskeletal:     Cervical back: Normal range of motion.     Right lower leg: Edema present.     Left lower leg: Edema present.     Comments: +2 bilateral pedal edema  Skin:    General: Skin is warm and dry.  Neurological:     General: No focal deficit  present.     Mental Status: He is alert and oriented to person, place, and time.  Labs on Admission: I have personally reviewed following labs and imaging studies  CBC: Recent Labs  Lab 06/02/22 1313 06/03/22 0254  WBC 11.4* 9.9  HGB 14.7 13.4  HCT 43.6 40.6  MCV 90.1 91.0  PLT 293 [id number]   Basic Metabolic Panel: Recent Labs  Lab 06/02/22 1313 06/03/22 0254 06/03/22 0606  NA 136 138  --   K 3.2* 3.3*  --   CL 99 102  --   CO2 24 26  --   GLUCOSE 163* 145*  --   BUN 14 12  --   CREATININE 1.44* 1.45*  --   CALCIUM 9.5 8.8*  --   MG  --  1.9 2.0   GFR: Estimated Creatinine Clearance: 47.9 mL/min (A) (by C-G formula based on SCr of 1.45 mg/dL (H)). Liver Function Tests: No results for input(s): "AST", "ALT", "ALKPHOS", "BILITOT", "PROT", "ALBUMIN" in the last 168 hours. No results for input(s): "LIPASE", "AMYLASE" in the last 168 hours. No results for input(s): "AMMONIA" in the last 168 hours. Coagulation Profile: No results for input(s): "INR", "PROTIME" in the last 168 hours. Cardiac Enzymes: No results for input(s): "CKTOTAL", "CKMB", "CKMBINDEX", "TROPONINI" in the last 168 hours. BNP (last 3 results) No results for input(s): "PROBNP" in the last 8760 hours. HbA1C: No results for input(s): "HGBA1C" in the last 72 hours. CBG: Recent Labs  Lab 06/02/22 1307  GLUCAP 158*   Lipid Profile: No results for input(s): "CHOL", "HDL", "LDLCALC", "TRIG", "CHOLHDL", "LDLDIRECT" in the last 72 hours. Thyroid Function Tests: No results for input(s): "TSH", "T4TOTAL", "FREET4", "T3FREE", "THYROIDAB" in the last 72 hours. Anemia Panel: No results for input(s): "VITAMINB12", "FOLATE", "FERRITIN", "TIBC", "IRON", "RETICCTPCT" in the last 72 hours. Urine analysis:    Component Value Date/Time   COLORURINE YELLOW 01/01/2021 1351   APPEARANCEUR CLEAR 01/01/2021 1351   LABSPEC 1.009 01/01/2021 1351   PHURINE 6.0 01/01/2021 1351   GLUCOSEU >=500 (A) 01/01/2021 1351    HGBUR SMALL (A) 01/01/2021 1351   BILIRUBINUR NEGATIVE 01/01/2021 1351   KETONESUR NEGATIVE 01/01/2021 1351   PROTEINUR >=300 (A) 01/01/2021 1351   UROBILINOGEN 1.0 05/31/2013 1432   NITRITE NEGATIVE 01/01/2021 1351   LEUKOCYTESUR NEGATIVE 01/01/2021 1351    Radiological Exams on Admission: DG Chest Port 1 View  Result Date: 06/03/2022 CLINICAL DATA:  Shortness of breath hypertension EXAM: PORTABLE CHEST 1 VIEW COMPARISON:  06/02/2022 FINDINGS: Hazy appearance of the bilateral lower chest attributed to layering pleural fluid with atelectasis, also seen on a chest CT from yesterday. Normal heart size. Spinal cord stimulator over the midthoracic spine. IMPRESSION: Hazy opacification of the lower chest from atelectasis and pleural fluid by CT yesterday. Electronically Signed   By: Jorje Guild M.D.   On: 06/03/2022 06:44   CT Angio Chest PE W and/or Wo Contrast  Result Date: 06/02/2022 CLINICAL DATA:  Dyspnea on exertion. Cough. Evaluate for pulmonary embolism. History of congestive heart failure, cardiac catheterization/stent, and diabetes. EXAM: CT ANGIOGRAPHY CHEST WITH CONTRAST TECHNIQUE: Multidetector CT imaging of the chest was performed using the standard protocol during bolus administration of intravenous contrast. Multiplanar CT image reconstructions and MIPs were obtained to evaluate the vascular anatomy. RADIATION DOSE REDUCTION: This exam was performed according to the departmental dose-optimization program which includes automated exposure control, adjustment of the mA and/or kV according to patient size and/or use of iterative reconstruction technique. CONTRAST:  58mL OMNIPAQUE IOHEXOL 350 MG/ML SOLN COMPARISON:  Chest two views 06/02/2022 and 05/14/2022 FINDINGS: Cardiovascular: The main pulmonary artery is  opacified up to 421 Hounsfield units. No filling defect is seen to indicate an acute pulmonary embolism. No thoracic aortic aneurysm. Dense coronary artery calcifications.  High-grade atherosclerotic calcifications throughout the thoracic aorta. Mediastinum/Nodes: No axillary lymphadenopathy. There are scattered subcentimeter short axis prevascular and right paratracheal lymph nodes. 11 mm short axis precarinal lymph node, likely reactive (axial series 5, image 130). 14 mm short axis subcarinal lymph node (axial series 5, image 155, coronal series 7, image 79), likely reactive. Mild right hilar likely lymph node soft tissue thickening. The visualized thyroid is unremarkable. Minimal diffuse esophageal wall thickening may be secondary to chronic inflammation. No focal mass is seen. Lungs/Pleura: The central airways are patent. Mild centrilobular emphysematous changes predominantly within the bilateral upper lungs. Moderate right and mild left pleural effusions with associated bilateral lower lobe compressive atelectasis. Upper Abdomen: There is moderate left and right adrenal gland thickening. There is low-density approximate 11 mm left adrenal nodule (axial series 4 images [id number]) with central low density of -13 Hounsfield units suggesting a benign lipid rich adenoma. Minimally partially visualized likely fluid density cyst within the upper pole of the left kidney. The rest of the left kidney itself is not imaged. No follow-up imaging recommended. Musculoskeletal: Moderate multilevel degenerative disc changes of the thoracic spine. Severe C6-7 disc space narrowing. Mild retrolisthesis of C6 on C7. Moderate superior L1 and mild superior T10 vertebral body degenerative Schmorl's nodes. A spinal cord stimulator electrode is seen within the posterior aspect of the central canal at the T7 through T9 levels. Review of the MIP images confirms the above findings. IMPRESSION: 1. No acute pulmonary embolism is seen. 2. Moderate right and mild left pleural effusions with associated bilateral lower lobe compressive atelectasis. 3. Mildly enlarged precarinal lymph node, likely reactive. Aortic  Atherosclerosis (ICD10-I70.0) and Emphysema (ICD10-J43.9). Electronically Signed   By: Yvonne Kendall M.D.   On: 06/02/2022 16:03   DG Chest 2 View  Result Date: 06/02/2022 CLINICAL DATA:  Shortness of breath EXAM: CHEST - 2 VIEW COMPARISON:  05/14/2022 FINDINGS: Mild bilateral interstitial thickening. No focal consolidation. No pleural effusion or pneumothorax. Heart and mediastinal contours are unremarkable. No acute osseous abnormality. Spinal stimulator noted. IMPRESSION: 1. Mild bilateral interstitial thickening which may reflect interstitial edema versus infection. Electronically Signed   By: Kathreen Devoid M.D.   On: 06/02/2022 13:44    EKG: Independently reviewed.  A-fib with RVR, no acute ischemic changes.  Assessment and Plan  Acute on chronic combined CHF Acute hypoxemic respiratory failure Oxygen saturation in the low 90s and placed on 2 L O2.  BNP 3560 and CT showing moderate right and mild left pleural effusions. Echo done 05/14/2022 showing EF 25 to 30%. -Continue diuresis with IV Lasix 40 mg twice daily -Continue nitro gtt given elevated blood pressure -Continue home meds after pharmacy med rec is done -Monitor intake and output, daily weights -Low-sodium diet with fluid restriction  Hypertensive emergency -Blood pressure improved with nitroglycerin gtt, continue at this time.  A-fib Currently rate controlled. -Continue Eliquis and Coreg after pharmacy med rec is done  CAD Elevated troponin Cath done 9/5 showing mild to moderate obstructive CAD.  Patient is not endorsing chest pain.  Mild troponin elevation likely due to demand ischemia from decompensated CHF. -Continue home meds after pharmacy med rec is done.  Mild leukocytosis -Resolved, no signs of infection.  Mild hypokalemia -Magnesium within normal range -Continue to monitor potassium and replace if low  CKD stage IIIa -Creatinine at baseline, continue to  monitor  Type 2 diabetes A1c 7.2 on  05/14/2022 -Sensitive sliding scale insulin  COPD: Stable, no signs of acute exacerbation. GERD Hyperlipidemia Hypothyroidism -Pharmacy med rec pending.  Code Status: Full Code (discussed with the patient) Family Communication: No family available at this time. Level of care: Progressive Care Unit Admission status: It is my clinical opinion that admission to INPATIENT is reasonable and necessary because of the expectation that this patient will require hospital care that crosses at least 2 midnights to treat this condition based on the medical complexity of the problems presented.  Given the aforementioned information, the predictability of an adverse outcome is felt to be significant.   Shela Leff MD [facility]  If 7PM-7AM, please contact night-coverage [email or web address]  06/03/2022, 8:05 AM

## 2022-06-03 NOTE — Progress Notes (Signed)
PROGRESS NOTE                                                                                                                                                                                                             Patient Demographics:    Albert Powell, is a 81 y.o. male, DOB - Jan 16, 1941, [id number]  Outpatient Primary MD for the patient is Leeroy Cha, MD    LOS - 1  Admit date - 06/02/2022    Chief Complaint  Patient presents with   Emesis   Shortness of Breath       Brief Narrative (HPI from H&P)   81 y.o. male with medical history significant of A-fib on Eliquis, CAD, chronic combined CHF, COPD, type 2 diabetes, GERD, hypertension, hyperlipidemia, hypothyroidism, obesity, CKD stage III.  Recently admitted for decompensated heart failure and echo done 8/30 showing EF 25 to 30% and regional wall motion abnormalities.  Right/left heart cath done 9/5 showing mild to moderate obstructive CAD.  Resented to the ER with chief complaints of nausea vomiting and shortness of breath, in the ER he was diagnosed with hypertensive emergency along with CHF and he was admitted to the hospital.   Subjective:    [name] today has, No headache, No chest pain, No abdominal pain - No Nausea, No new weakness tingling or numbness, improved shortness of breath.   Assessment  & Plan :   Hypertensive emergency causing acute on chronic systolic heart failure EF 25% on recent echocardiogram.  He is currently on nitroglycerin drip, has received IV Lasix, on supplemental oxygen, blood pressure and shortness of breath both are improving.  Have placed him on oral blood pressure medications which include Coreg, hydralazine, Imdur, in the hopes of titrating off nitroglycerin drip, continue supplemental oxygen and IV Lasix and Aldactone for diuresis.  Have requested cardiology to evaluate as well.  Currently chest pain-free.  Mild  bump in troponin is in non-ACS pattern with flat trend suggestive of demand mismatch due to hypertensive crisis and hypoxia.  Avoiding ACE/ARB currently due to underlying CKD stage IIIb.   2.  CAD.  No acute issues chest pain-free currently on combination of oral beta-blocker, statin and Plavix for secondary prevention.  No acute issue.  He underwent left and right heart catheter recently with no Ute findings.  3.  Paroxysmal  atrial fibrillation.  Currently in rate controlled continue Coreg, continue Eliquis.  Mali vas 2 score is greater than 3.  4.  CKD stage III B.  Baseline creatinine around 1.5.  Monitor closely.  5.  Hypertension with hypertensive emergency.  Currently see #1 above.  6.  Hypothyroidism.  On Synthroid.  7.  GERD.  On PPI.  8.  Dyslipidemia.  On statin.  9.  DM type II.  Sliding scale.  Lab Results  Component Value Date   HGBA1C 7.2 (H) 05/14/2022   CBG (last 3)  Recent Labs    06/02/22 1307  GLUCAP 158*          Condition - Fair  Family Communication  :  None present  Code Status :  Full  Consults  :  Cards  PUD Prophylaxis :  PPI   Procedures  :     CTA - 1. No acute pulmonary embolism is seen. 2. Moderate right and mild left pleural effusions with associated bilateral lower lobe compressive atelectasis. 3. Mildly enlarged precarinal lymph node, likely reactive. Aortic Atherosclerosis (ICD10-I70.0) and Emphysema (ICD10-J43.9).    TTE - 04/2022 -  1. Left ventricular ejection fraction, by estimation, is 25 to 30%. The left ventricle has severely decreased function. The left ventricle demonstrates regional wall motion abnormalities (see scoring diagram/findings for description).   2. Right ventricular systolic function is normal. The right ventricular size is normal. Tricuspid regurgitation signal is inadequate for assessing PA pressure.   3. Trivial mitral valve regurgitation. No evidence of mitral stenosis.   4. The aortic valve is tricuspid.  There is mild calcification of the aortic valve. There is mild thickening of the aortic valve. Aortic valve  sclerosis/calcification is present, without any evidence of aortic stenosis.   5. The inferior vena cava is dilated in size with <50% respiratory variability, suggesting right atrial pressure of 15 mmHg.        Disposition Plan  :    Status is: Inpatient   DVT Prophylaxis  :     apixaban (ELIQUIS) tablet 5 mg      Lab Results  Component Value Date   PLT 247 06/03/2022    Diet :  Diet Order             Diet heart healthy/carb modified Room service appropriate? Yes; Fluid consistency: Thin; Fluid restriction: 1200 mL Fluid  Diet effective now                    Inpatient Medications  Scheduled Meds:  apixaban  5 mg Oral BID   atorvastatin  40 mg Oral Daily   carvedilol  6.25 mg Oral BID WC   clopidogrel  75 mg Oral Daily   furosemide  40 mg Intravenous BID   hydrALAZINE  100 mg Oral Q8H   insulin aspart  0-5 Units Subcutaneous QHS   insulin aspart  0-9 Units Subcutaneous TID WC   isosorbide mononitrate  60 mg Oral Daily   levothyroxine  25 mcg Oral Q0600   pantoprazole  40 mg Oral QODAY   potassium chloride  40 mEq Oral Daily   spironolactone  25 mg Oral Daily   Continuous Infusions:  nitroGLYCERIN 40 mcg/min (06/03/22 0600)   PRN Meds:.acetaminophen **OR** acetaminophen  Antibiotics  :    Anti-infectives (From admission, onward)    None        Time Spent in minutes  30   Lala Lund M.D on 06/03/2022 at 8:01 AM  To page go to [email or web address]   [facility] -  Office  [phone]  See all Orders from today for further details    Objective:   Vitals:   06/03/22 0500 06/03/22 0530 06/03/22 0605 06/03/22 0630  BP: (!) 150/67 (!) 182/73 (!) 184/99 (!) 158/80  Pulse: 69 69 78 65  Resp: 17 20 20 20   Temp:      TempSrc:      SpO2: 93% 93% 96% 93%  Weight: 99.1 kg     Height:        Wt Readings from Last 3 Encounters:   06/03/22 99.1 kg  05/21/22 101.2 kg  12/31/21 102.9 kg     Intake/Output Summary (Last 24 hours) at 06/03/2022 0801 Last data filed at 06/03/2022 0600 Gross per 24 hour  Intake --  Output 600 ml  Net -600 ml     Physical Exam  Awake Alert, No new F.N deficits, Normal affect [place].AT,PERRAL Supple Neck, No JVD,   Symmetrical Chest wall movement, Good air movement bilaterally, +ve rales RRR,No Gallops,Rubs or new Murmurs,  +ve B.Sounds, Abd Soft, No tenderness,   No Cyanosis, Clubbing or edema       Data Review:    CBC Recent Labs  Lab 06/02/22 1313 06/03/22 0254  WBC 11.4* 9.9  HGB 14.7 13.4  HCT 43.6 40.6  PLT 293 247  MCV 90.1 91.0  MCH 30.4 30.0  MCHC 33.7 33.0  RDW 14.1 14.2    Electrolytes Recent Labs  Lab 06/02/22 1313 06/03/22 0254 06/03/22 0606 06/03/22 0615  NA 136 138  --   --   K 3.2* 3.3*  --   --   CL 99 102  --   --   CO2 24 26  --   --   GLUCOSE 163* 145*  --   --   BUN 14 12  --   --   CREATININE 1.44* 1.45*  --   --   CALCIUM 9.5 8.8*  --   --   MG  --  1.9 2.0  --   DDIMER 1.22*  --   --   --   BNP 3,560.0*  --   --  2,826.1*     Radiology Reports DG Chest Port 1 View  Result Date: 06/03/2022 CLINICAL DATA:  Shortness of breath hypertension EXAM: PORTABLE CHEST 1 VIEW COMPARISON:  06/02/2022 FINDINGS: Hazy appearance of the bilateral lower chest attributed to layering pleural fluid with atelectasis, also seen on a chest CT from yesterday. Normal heart size. Spinal cord stimulator over the midthoracic spine. IMPRESSION: Hazy opacification of the lower chest from atelectasis and pleural fluid by CT yesterday. Electronically Signed   By: Jorje Guild M.D.   On: 06/03/2022 06:44   CT Angio Chest PE W and/or Wo Contrast  Result Date: 06/02/2022 CLINICAL DATA:  Dyspnea on exertion. Cough. Evaluate for pulmonary embolism. History of congestive heart failure, cardiac catheterization/stent, and diabetes. EXAM: CT ANGIOGRAPHY CHEST WITH  CONTRAST TECHNIQUE: Multidetector CT imaging of the chest was performed using the standard protocol during bolus administration of intravenous contrast. Multiplanar CT image reconstructions and MIPs were obtained to evaluate the vascular anatomy. RADIATION DOSE REDUCTION: This exam was performed according to the departmental dose-optimization program which includes automated exposure control, adjustment of the mA and/or kV according to patient size and/or use of iterative reconstruction technique. CONTRAST:  14mL OMNIPAQUE IOHEXOL 350 MG/ML SOLN COMPARISON:  Chest two views 06/02/2022 and 05/14/2022 FINDINGS: Cardiovascular: The main pulmonary artery  is opacified up to 421 Hounsfield units. No filling defect is seen to indicate an acute pulmonary embolism. No thoracic aortic aneurysm. Dense coronary artery calcifications. High-grade atherosclerotic calcifications throughout the thoracic aorta. Mediastinum/Nodes: No axillary lymphadenopathy. There are scattered subcentimeter short axis prevascular and right paratracheal lymph nodes. 11 mm short axis precarinal lymph node, likely reactive (axial series 5, image 130). 14 mm short axis subcarinal lymph node (axial series 5, image 155, coronal series 7, image 79), likely reactive. Mild right hilar likely lymph node soft tissue thickening. The visualized thyroid is unremarkable. Minimal diffuse esophageal wall thickening may be secondary to chronic inflammation. No focal mass is seen. Lungs/Pleura: The central airways are patent. Mild centrilobular emphysematous changes predominantly within the bilateral upper lungs. Moderate right and mild left pleural effusions with associated bilateral lower lobe compressive atelectasis. Upper Abdomen: There is moderate left and right adrenal gland thickening. There is low-density approximate 11 mm left adrenal nodule (axial series 4 images [id number]) with central low density of -13 Hounsfield units suggesting a benign lipid rich adenoma.  Minimally partially visualized likely fluid density cyst within the upper pole of the left kidney. The rest of the left kidney itself is not imaged. No follow-up imaging recommended. Musculoskeletal: Moderate multilevel degenerative disc changes of the thoracic spine. Severe C6-7 disc space narrowing. Mild retrolisthesis of C6 on C7. Moderate superior L1 and mild superior T10 vertebral body degenerative Schmorl's nodes. A spinal cord stimulator electrode is seen within the posterior aspect of the central canal at the T7 through T9 levels. Review of the MIP images confirms the above findings. IMPRESSION: 1. No acute pulmonary embolism is seen. 2. Moderate right and mild left pleural effusions with associated bilateral lower lobe compressive atelectasis. 3. Mildly enlarged precarinal lymph node, likely reactive. Aortic Atherosclerosis (ICD10-I70.0) and Emphysema (ICD10-J43.9). Electronically Signed   By: Yvonne Kendall M.D.   On: 06/02/2022 16:03   DG Chest 2 View  Result Date: 06/02/2022 CLINICAL DATA:  Shortness of breath EXAM: CHEST - 2 VIEW COMPARISON:  05/14/2022 FINDINGS: Mild bilateral interstitial thickening. No focal consolidation. No pleural effusion or pneumothorax. Heart and mediastinal contours are unremarkable. No acute osseous abnormality. Spinal stimulator noted. IMPRESSION: 1. Mild bilateral interstitial thickening which may reflect interstitial edema versus infection. Electronically Signed   By: Kathreen Devoid M.D.   On: 06/02/2022 13:44

## 2022-06-03 NOTE — Progress Notes (Signed)
Heart Failure Nurse Navigator Progress Note  PCP: Leeroy Cha, MD PCP-Cardiologist: Gwenlyn Found Admission Diagnosis: Acute on chronic congestive heart failure, Shortness of breath, NSTEMI, essential hypertension, Hypertensive emergency.  Admitted from: Home  Presentation:   Albert Powell presented with shortness of breath and emesis with occasional dry heaving. BP 193/87, HR 79, Bilateral lower extremity edema, BNP 3,560, Trop 37, K+ 3.2, IV lasix and hydralazine and PO potassium given, CXR : right side pleural effusion, CTA , negative for PE,  Patient was educated on the sign and symptoms of heart failure, daily weights , when to call his doctor or go to the ER. Diet and fluid restrictions, patient states his wife does the cooking and they watch their salt intake. Taking all medications as prescribed,( his wife lays his pills out for him daily), and keeping all his medical appointments. Patient verbalized his understanding of education and is scheduled for a HF TOC appointment on 06/18/2022 @ 2 pm.   ECHO/ LVEF: 25-30%  Clinical Course:  Past Medical History:  Diagnosis Date   Atrial fibrillation ([name]) 03/30/2020   Bradycardia, sinus 02/18/2013   CAD (coronary artery disease)    stent to RCA 2003 also had 40% lesion at that time; NUCLEAR STRESS TEST, 01/16/2010 - normal  now with cath 80-90% stenosis in LAD, will try medical therapy if no improvement PCI   Carotid artery disease ([name])    Cataract    CHF (congestive heart failure) ([name])    Claudication ([name])    LEA DUPLEX, 07/07/2008 - Normal   COPD (chronic obstructive pulmonary disease) ([name])    DDD (degenerative disc disease) 2008   Diabetes mellitus    GERD (gastroesophageal reflux disease)    H/O hiatal hernia    History of colonoscopy 2004   finding of tics and AMV only no polpys   Hyperlipidemia 02/05/2013   Hypertension    Onychomycosis    Rosacea    Tobacco abuse 02/05/2013     Social History   Socioeconomic  History   Marital status: Married    Spouse name: [name]   Number of children: 1   Years of education: Not on file   Highest education level: High school graduate  Occupational History   Occupation: retired  Tobacco Use   Smoking status: Former    Packs/day: 1.50    Years: 57.00    Total pack years: 85.50    Types: Cigarettes, E-cigarettes    Quit date: 2020    Years since quitting: 3.7   Smokeless tobacco: Never   Tobacco comments:    pt states that he is using the vapor cigs--12/2021  Vaping Use   Vaping Use: Former   Start date: 09/15/2016   Substances: Nicotine  Substance and Sexual Activity   Alcohol use: Yes    Alcohol/week: 1.0 standard drink of alcohol    Types: 1 Glasses of wine per week    Comment: "very little"   Drug use: No   Sexual activity: Not on file  Other Topics Concern   Not on file  Social History Narrative   Not on file   Social Determinants of Health   Financial Resource Strain: Low Risk  (06/03/2022)   Overall Financial Resource Strain (CARDIA)    Difficulty of Paying Living Expenses: Not hard at all  Food Insecurity: No Food Insecurity (06/03/2022)   Hunger Vital Sign    Worried About Running Out of Food in the Last Year: Never true    Ran Out  of Food in the Last Year: Never true  Transportation Needs: No Transportation Needs (06/03/2022)   PRAPARE - Administrator, Civil Service (Medical): No    Lack of Transportation (Non-Medical): No  Physical Activity: Not on file  Stress: Not on file  Social Connections: Not on file   Education Assessment and Provision:  Detailed education and instructions provided on heart failure disease management including the following:  Signs and symptoms of Heart Failure When to call the physician Importance of daily weights Low sodium diet Fluid restriction Medication management Anticipated future follow-up appointments  Patient education given on each of the above topics.  Patient  acknowledges understanding via teach back method and acceptance of all instructions.  Education Materials:  "Living Better With Heart Failure" Booklet, HF zone tool, & Daily Weight Tracker Tool.  Patient has scale at home: yes Patient has pill box at home: No    High Risk Criteria for Readmission and/or Poor Patient Outcomes: Heart failure hospital admissions (last 6 months): 2  No Show rate: 8 % Difficult social situation: No Demonstrates medication adherence: Yes Primary Language: English Literacy level: reading, writing, and comprehension.   Barriers of Care:   Diet/ fluid restrictions Daily weights  Considerations/Referrals:   Referral made to Heart Failure Pharmacist Stewardship: Yes Referral made to Heart Failure CSW/NCM TOC: No Referral made to Heart & Vascular TOC clinic: Yes, 06/18/2022 @ 2 pm..   Items for Follow-up on DC/TOC: Diet/ fluid restrictions Daily weights   Rhae Hammock, BSN, RN Heart Failure Teacher, adult education Only

## 2022-06-04 DIAGNOSIS — I5041 Acute combined systolic (congestive) and diastolic (congestive) heart failure: Secondary | ICD-10-CM | POA: Diagnosis not present

## 2022-06-04 DIAGNOSIS — I161 Hypertensive emergency: Secondary | ICD-10-CM | POA: Diagnosis not present

## 2022-06-04 DIAGNOSIS — I480 Paroxysmal atrial fibrillation: Secondary | ICD-10-CM

## 2022-06-04 LAB — BASIC METABOLIC PANEL
Anion gap: 12 (ref 5–15)
BUN: 16 mg/dL (ref 8–23)
CO2: 22 mmol/L (ref 22–32)
Calcium: 8.5 mg/dL — ABNORMAL LOW (ref 8.9–10.3)
Chloride: 102 mmol/L (ref 98–111)
Creatinine, Ser: 1.88 mg/dL — ABNORMAL HIGH (ref 0.61–1.24)
GFR, Estimated: 36 mL/min — ABNORMAL LOW (ref >60)
Glucose, Bld: 127 mg/dL — ABNORMAL HIGH (ref 70–99)
Potassium: 3.4 mmol/L — ABNORMAL LOW (ref 3.5–5.1)
Sodium: 136 mmol/L (ref 135–145)

## 2022-06-04 LAB — CBC WITH DIFFERENTIAL/PLATELET
Abs Immature Granulocytes: 0.08 10*3/uL — ABNORMAL HIGH (ref 0.00–0.07)
Basophils Absolute: 0.1 10*3/uL (ref 0.0–0.1)
Basophils Relative: 1 %
Eosinophils Absolute: 0.4 10*3/uL (ref 0.0–0.5)
Eosinophils Relative: 4 %
HCT: 38.1 % — ABNORMAL LOW (ref 39.0–52.0)
Hemoglobin: 12.9 g/dL — ABNORMAL LOW (ref 13.0–17.0)
Immature Granulocytes: 1 %
Lymphocytes Relative: 14 %
Lymphs Abs: 1.3 10*3/uL (ref 0.7–4.0)
MCH: 30.8 pg (ref 26.0–34.0)
MCHC: 33.9 g/dL (ref 30.0–36.0)
MCV: 90.9 fL (ref 80.0–100.0)
Monocytes Absolute: 0.8 10*3/uL (ref 0.1–1.0)
Monocytes Relative: 9 %
Neutro Abs: 6.6 10*3/uL (ref 1.7–7.7)
Neutrophils Relative %: 71 %
Platelets: 200 10*3/uL (ref 150–400)
RBC: 4.19 MIL/uL — ABNORMAL LOW (ref 4.22–5.81)
RDW: 14.1 % (ref 11.5–15.5)
WBC: 9.3 10*3/uL (ref 4.0–10.5)
nRBC: 0 % (ref 0.0–0.2)

## 2022-06-04 LAB — GLUCOSE, CAPILLARY
Glucose-Capillary: 126 mg/dL — ABNORMAL HIGH (ref 70–99)
Glucose-Capillary: 168 mg/dL — ABNORMAL HIGH (ref 70–99)
Glucose-Capillary: 131 mg/dL — ABNORMAL HIGH (ref 70–99)
Glucose-Capillary: 209 mg/dL — ABNORMAL HIGH (ref 70–99)

## 2022-06-04 LAB — MAGNESIUM: Magnesium: 1.9 mg/dL (ref 1.7–2.4)

## 2022-06-04 LAB — BRAIN NATRIURETIC PEPTIDE: B Natriuretic Peptide: 1337.2 pg/mL — ABNORMAL HIGH (ref 0.0–100.0)

## 2022-06-04 MED ORDER — HYDRALAZINE HCL 20 MG/ML IJ SOLN
5.0000 mg | INTRAMUSCULAR | Status: DC | PRN
  Administered 2022-06-04: 5 mg via INTRAVENOUS
  Filled 2022-06-04: qty 1

## 2022-06-04 MED ORDER — HYDRALAZINE HCL 20 MG/ML IJ SOLN
10.0000 mg | INTRAMUSCULAR | Status: DC | PRN
  Administered 2022-06-04: 10 mg via INTRAVENOUS
  Filled 2022-06-04: qty 1

## 2022-06-04 MED ORDER — CARVEDILOL 12.5 MG PO TABS
12.5000 mg | ORAL_TABLET | Freq: Two times a day (BID) | ORAL | Status: DC
  Administered 2022-06-04 – 2022-06-06 (×4): 12.5 mg via ORAL
  Filled 2022-06-04 (×4): qty 1

## 2022-06-04 MED ORDER — POTASSIUM CHLORIDE CRYS ER 20 MEQ PO TBCR
20.0000 meq | EXTENDED_RELEASE_TABLET | Freq: Two times a day (BID) | ORAL | Status: AC
  Administered 2022-06-04 (×2): 20 meq via ORAL
  Filled 2022-06-04 (×2): qty 1

## 2022-06-04 MED ORDER — HYDRALAZINE HCL 20 MG/ML IJ SOLN
10.0000 mg | Freq: Once | INTRAMUSCULAR | Status: AC
  Administered 2022-06-04: 10 mg via INTRAVENOUS
  Filled 2022-06-04: qty 1

## 2022-06-04 MED ORDER — AMLODIPINE BESYLATE 5 MG PO TABS
5.0000 mg | ORAL_TABLET | Freq: Every day | ORAL | Status: DC
  Administered 2022-06-04 – 2022-06-06 (×3): 5 mg via ORAL
  Filled 2022-06-04 (×3): qty 1

## 2022-06-04 MED ORDER — FUROSEMIDE 10 MG/ML IJ SOLN
40.0000 mg | Freq: Every day | INTRAMUSCULAR | Status: DC
  Filled 2022-06-04: qty 4

## 2022-06-04 MED ORDER — CARVEDILOL 6.25 MG PO TABS: 6.2500 mg | ORAL_TABLET | Freq: Two times a day (BID) | ORAL | Status: DC

## 2022-06-04 MED ORDER — SACUBITRIL-VALSARTAN 97-103 MG PO TABS
1.0000 | ORAL_TABLET | Freq: Two times a day (BID) | ORAL | Status: DC
  Administered 2022-06-04 – 2022-06-06 (×5): 1 via ORAL
  Filled 2022-06-04 (×6): qty 1

## 2022-06-04 NOTE — Evaluation (Signed)
Occupational Therapy Evaluation Patient Details Name: Albert Powell MRN: [id number] DOB: [birth year] Today's Date: 06/04/2022   History of Present Illness 81 yo male presents to [facility] on 9/18 with nausea, cough, ShoB, HTN due to acute on chronic HF. CTA shows  moderate right and mild left pleural effusions with associated bilateral lower lobe compressive atelectasis. Recent admission 8/29 for CHF exacerbation. PMH includes: CAD, chronic diastolic CHF, essential hypertension, hyperlipidemia, A-fib, COPD and T2DM   Clinical Impression   Patient admitted for the diagnosis above.  PTA he lives with his wife, who is undergoing lung CA treatment.  Patient is restless and just wants to go home.  He is not interested in home rehab, and is moving in the room with Dist S to Mod Ind.  The patient has been weaned back to room air, and walked with mobility specialist earlier.  Recommend continued mobility in the acute setting, and no post acute OT is anticipated.        Recommendations for follow up therapy are one component of a multi-disciplinary discharge planning process, led by the attending physician.  Recommendations may be updated based on patient status, additional functional criteria and insurance authorization.   Follow Up Recommendations  No OT follow up    Assistance Recommended at Discharge PRN  Patient can return home with the following Direct supervision/assist for medications management    Functional Status Assessment  Patient has had a recent decline in their functional status and demonstrates the ability to make significant improvements in function in a reasonable and predictable amount of time.  Equipment Recommendations  None recommended by OT    Recommendations for Other Services       Precautions / Restrictions Precautions Precautions: Fall Restrictions Weight Bearing Restrictions: No      Mobility Bed Mobility Overal bed mobility: Modified Independent                   Transfers Overall transfer level: Modified independent                 General transfer comment: In room mobility      Balance Overall balance assessment: Needs assistance Sitting-balance support: Feet supported Sitting balance-Leahy Scale: Good     Standing balance support: Reliant on assistive device for balance Standing balance-Leahy Scale: Fair                             ADL either performed or assessed with clinical judgement   ADL Overall ADL's : At baseline                                             Vision Patient Visual Report: No change from baseline       Perception Perception Perception: Not tested   Praxis Praxis Praxis: Not tested    Pertinent Vitals/Pain Pain Assessment Pain Assessment: No/denies pain Pain Intervention(s): Monitored during session     Hand Dominance Right   Extremity/Trunk Assessment Upper Extremity Assessment Upper Extremity Assessment: Overall WFL for tasks assessed   Lower Extremity Assessment Lower Extremity Assessment: Defer to PT evaluation   Cervical / Trunk Assessment Cervical / Trunk Assessment: Normal   Communication Communication Communication: No difficulties   Cognition Arousal/Alertness: Awake/alert Behavior During Therapy: WFL for tasks assessed/performed Overall Cognitive Status: Within Functional Limits for tasks assessed  General Comments   Watch HR    Exercises     Shoulder Instructions      Home Living Family/patient expects to be discharged to:: Private residence Living Arrangements: Spouse/significant other Available Help at Discharge: Family;Available 24 hours/day Type of Home: Apartment Home Access: Level entry     Home Layout: One level     Bathroom Shower/Tub: Tub/shower unit;Curtain   Biochemist, clinical: Standard Bathroom Accessibility: Yes How Accessible: Accessible via  walker Home Equipment: Rollator (4 wheels);Shower seat;Cane - single point          Prior Functioning/Environment Prior Level of Function : Driving;Independent/Modified Independent             Mobility Comments: Uses rollator for mobility ADLs Comments: reports independence ADL and iADL        OT Problem List: Decreased activity tolerance      OT Treatment/Interventions:      OT Goals(Current goals can be found in the care plan section) Acute Rehab OT Goals Patient Stated Goal: Wants to leave OT Goal Formulation: All assessment and education complete, DC therapy Potential to Achieve Goals: Good  OT Frequency:      Co-evaluation              AM-PAC OT "6 Clicks" Daily Activity     Outcome Measure Help from another person eating meals?: None Help from another person taking care of personal grooming?: None Help from another person toileting, which includes using toliet, bedpan, or urinal?: None Help from another person bathing (including washing, rinsing, drying)?: None Help from another person to put on and taking off regular upper body clothing?: None Help from another person to put on and taking off regular lower body clothing?: None 6 Click Score: 24   End of Session Equipment Utilized During Treatment: Rollator (4 wheels) Nurse Communication: Mobility status  Activity Tolerance: Patient tolerated treatment well Patient left: in bed;with call bell/phone within reach;with bed alarm set  OT Visit Diagnosis: Unsteadiness on feet (R26.81)                Time: [id number] OT Time Calculation (min): 12 min Charges:  OT General Charges $OT Visit: 1 Visit OT Evaluation $OT Eval Moderate Complexity: 1 Mod  06/04/2022  RP, OTR/L  Acute Rehabilitation Services  Office:  [phone]   Metta Clines 06/04/2022, 3:22 PM

## 2022-06-04 NOTE — Progress Notes (Signed)
Rounding Note    Patient Name: Albert Powell Date of Encounter: 06/04/2022  [place] Cardiologist: Quay Burow, MD   Subjective   No chest pain and breathing is improved.  Inpatient Medications    Scheduled Meds:  apixaban  5 mg Oral BID   atorvastatin  40 mg Oral Daily   carvedilol  6.25 mg Oral BID WC   clopidogrel  75 mg Oral Daily   furosemide  40 mg Intravenous BID   hydrALAZINE  100 mg Oral Q8H   insulin aspart  0-5 Units Subcutaneous QHS   insulin aspart  0-9 Units Subcutaneous TID WC   isosorbide mononitrate  60 mg Oral Daily   levothyroxine  25 mcg Oral Q0600   pantoprazole  40 mg Oral QODAY   potassium chloride  40 mEq Oral Daily   spironolactone  25 mg Oral Daily   Continuous Infusions:  PRN Meds: acetaminophen **OR** acetaminophen, hydrALAZINE   Vital Signs    Vitals:   06/04/22 0505 06/04/22 0545 06/04/22 0756 06/04/22 1000  BP: (!) 195/93 (!) 195/93 (!) 169/88 (!) 187/81  Pulse:   93   Resp: 18  20 13   Temp:      TempSrc:      SpO2:      Weight:      Height:        Intake/Output Summary (Last 24 hours) at 06/04/2022 1032 Last data filed at 06/04/2022 0600 Gross per 24 hour  Intake --  Output 1700 ml  Net -1700 ml      06/04/2022    5:00 AM 06/03/2022    5:00 AM 06/02/2022    1:05 PM  Last 3 Weights  Weight (lbs) 222 lb 7.1 oz 218 lb 7.6 oz 223 lb  Weight (kg) 100.9 kg 99.1 kg 101.152 kg     Telemetry    Atrial fib with RVR at times, SR with PACs at times - Personally Reviewed  ECG    Non new - Personally Reviewed  Physical Exam   GEN: No acute distress.   Neck: No JVD Cardiac: irreg irreg, no murmurs, rubs, or gallops.  Respiratory: Clear to auscultation bilaterally. GI: Soft, nontender, non-distended  MS: No edema; No deformity. Neuro:  Nonfocal  Psych: Normal affect   Labs    High Sensitivity Troponin:   Recent Labs  Lab 05/13/22 2131 06/02/22 1313 06/02/22 1539 06/02/22 1834 06/02/22 2312   TROPONINIHS 85* <2 37* 35* 42*     Chemistry Recent Labs  Lab 06/02/22 1313 06/03/22 0254 06/03/22 0606 06/04/22 0601  NA 136 138  --  136  K 3.2* 3.3*  --  3.4*  CL 99 102  --  102  CO2 24 26  --  22  GLUCOSE 163* 145*  --  127*  BUN 14 12  --  16  CREATININE 1.44* 1.45*  --  1.88*  CALCIUM 9.5 8.8*  --  8.5*  MG  --  1.9 2.0 1.9  GFRNONAA 49* 49*  --  36*  ANIONGAP 13 10  --  12    Lipids No results for input(s): "CHOL", "TRIG", "HDL", "LABVLDL", "LDLCALC", "CHOLHDL" in the last 168 hours.  Hematology Recent Labs  Lab 06/02/22 1313 06/03/22 0254 06/04/22 0601  WBC 11.4* 9.9 9.3  RBC 4.84 4.46 4.19*  HGB 14.7 13.4 12.9*  HCT 43.6 40.6 38.1*  MCV 90.1 91.0 90.9  MCH 30.4 30.0 30.8  MCHC 33.7 33.0 33.9  RDW 14.1 14.2 14.1  PLT 293 247 200   Thyroid No results for input(s): "TSH", "FREET4" in the last 168 hours.  BNP Recent Labs  Lab 06/02/22 1313 06/03/22 0615 06/04/22 0601  BNP 3,560.0* 2,826.1* 1,337.2*    DDimer  Recent Labs  Lab 06/02/22 1313  DDIMER 1.22*     Radiology    DG Chest Port 1 View  Result Date: 06/03/2022 CLINICAL DATA:  Shortness of breath hypertension EXAM: PORTABLE CHEST 1 VIEW COMPARISON:  06/02/2022 FINDINGS: Hazy appearance of the bilateral lower chest attributed to layering pleural fluid with atelectasis, also seen on a chest CT from yesterday. Normal heart size. Spinal cord stimulator over the midthoracic spine. IMPRESSION: Hazy opacification of the lower chest from atelectasis and pleural fluid by CT yesterday. Electronically Signed   By: Tiburcio Pea M.D.   On: 06/03/2022 06:44   CT Angio Chest PE W and/or Wo Contrast  Result Date: 06/02/2022 CLINICAL DATA:  Dyspnea on exertion. Cough. Evaluate for pulmonary embolism. History of congestive heart failure, cardiac catheterization/stent, and diabetes. EXAM: CT ANGIOGRAPHY CHEST WITH CONTRAST TECHNIQUE: Multidetector CT imaging of the chest was performed using the standard  protocol during bolus administration of intravenous contrast. Multiplanar CT image reconstructions and MIPs were obtained to evaluate the vascular anatomy. RADIATION DOSE REDUCTION: This exam was performed according to the departmental dose-optimization program which includes automated exposure control, adjustment of the mA and/or kV according to patient size and/or use of iterative reconstruction technique. CONTRAST:  39mL OMNIPAQUE IOHEXOL 350 MG/ML SOLN COMPARISON:  Chest two views 06/02/2022 and 05/14/2022 FINDINGS: Cardiovascular: The main pulmonary artery is opacified up to 421 Hounsfield units. No filling defect is seen to indicate an acute pulmonary embolism. No thoracic aortic aneurysm. Dense coronary artery calcifications. High-grade atherosclerotic calcifications throughout the thoracic aorta. Mediastinum/Nodes: No axillary lymphadenopathy. There are scattered subcentimeter short axis prevascular and right paratracheal lymph nodes. 11 mm short axis precarinal lymph node, likely reactive (axial series 5, image 130). 14 mm short axis subcarinal lymph node (axial series 5, image 155, coronal series 7, image 79), likely reactive. Mild right hilar likely lymph node soft tissue thickening. The visualized thyroid is unremarkable. Minimal diffuse esophageal wall thickening may be secondary to chronic inflammation. No focal mass is seen. Lungs/Pleura: The central airways are patent. Mild centrilobular emphysematous changes predominantly within the bilateral upper lungs. Moderate right and mild left pleural effusions with associated bilateral lower lobe compressive atelectasis. Upper Abdomen: There is moderate left and right adrenal gland thickening. There is low-density approximate 11 mm left adrenal nodule (axial series 4 images [id number]) with central low density of -13 Hounsfield units suggesting a benign lipid rich adenoma. Minimally partially visualized likely fluid density cyst within the upper pole of the left  kidney. The rest of the left kidney itself is not imaged. No follow-up imaging recommended. Musculoskeletal: Moderate multilevel degenerative disc changes of the thoracic spine. Severe C6-7 disc space narrowing. Mild retrolisthesis of C6 on C7. Moderate superior L1 and mild superior T10 vertebral body degenerative Schmorl's nodes. A spinal cord stimulator electrode is seen within the posterior aspect of the central canal at the T7 through T9 levels. Review of the MIP images confirms the above findings. IMPRESSION: 1. No acute pulmonary embolism is seen. 2. Moderate right and mild left pleural effusions with associated bilateral lower lobe compressive atelectasis. 3. Mildly enlarged precarinal lymph node, likely reactive. Aortic Atherosclerosis (ICD10-I70.0) and Emphysema (ICD10-J43.9). Electronically Signed   By: Neita Garnet M.D.   On: 06/02/2022 16:03   DG  Chest 2 View  Result Date: 06/02/2022 CLINICAL DATA:  Shortness of breath EXAM: CHEST - 2 VIEW COMPARISON:  05/14/2022 FINDINGS: Mild bilateral interstitial thickening. No focal consolidation. No pleural effusion or pneumothorax. Heart and mediastinal contours are unremarkable. No acute osseous abnormality. Spinal stimulator noted. IMPRESSION: 1. Mild bilateral interstitial thickening which may reflect interstitial edema versus infection. Electronically Signed   By: Kathreen Devoid M.D.   On: 06/02/2022 13:44    Cardiac Studies   Cardiac cath 05/20/22   Prox LAD to Mid LAD lesion is 40% stenosed.   Prox Cx to Mid Cx lesion is 30% stenosed.   Prox RCA to Mid RCA lesion is 60% stenosed.   1st Diag lesion is 60% stenosed.   Dist LAD lesion is 40% stenosed.   Non-stenotic Ost RCA to Prox RCA lesion was previously treated.   1.  Mild to moderate obstructive coronary artery disease; compared with the previous study the angiographic appearance looks relatively improved.  Overall the burden of coronary artery disease does not explain the patient's severe  cardiomyopathy.   2.  Mean RA pressure of 5 mmHg and mean wedge pressure of 5 mmHg with normal cardiac output of 5.9 L/min and cardiac index of 2.7 L/min/m.   Recommendation: Goal-directed medical therapy.  Diagnostic Dominance: Right ECHO    Echo 05/14/22     1. Left ventricular ejection fraction, by estimation, is 25 to 30%. The  left ventricle has severely decreased function. The left ventricle  demonstrates regional wall motion abnormalities (see scoring  diagram/findings for description).   2. Right ventricular systolic function is normal. The right ventricular  size is normal. Tricuspid regurgitation signal is inadequate for assessing  PA pressure.   3. Trivial mitral valve regurgitation. No evidence of mitral stenosis.   4. The aortic valve is tricuspid. There is mild calcification of the  aortic valve. There is mild thickening of the aortic valve. Aortic valve  sclerosis/calcification is present, without any evidence of aortic  stenosis.   5. The inferior vena cava is dilated in size with <50% respiratory  variability, suggesting right atrial pressure of 15 mmHg.   Comparison(s): Prior images reviewed side by side. The left ventricular  function is significantly worse. The left ventricular wall motion  abnormalities are significantly worse.   FINDINGS   Left Ventricle: Left ventricular ejection fraction, by estimation, is 25  to 30%. The left ventricle has severely decreased function. The left  ventricle demonstrates regional wall motion abnormalities. Severe  hypokinesis of the left ventricular,  basal-mid inferoseptal wall, inferior wall and anterolateral wall. Mild  hypokinesis of the left ventricular, entire anteroseptal wall, anterior  wall and apical segment. The left ventricular internal cavity size was  normal in size. There is no left  ventricular hypertrophy.   Right Ventricle: The right ventricular size is normal. No increase in  right ventricular wall  thickness. Right ventricular systolic function is  normal. Tricuspid regurgitation signal is inadequate for assessing PA  pressure.   Right Atrium: Right atrial size was normal in size.   Pericardium: Trivial pericardial effusion is present.   Mitral Valve: Mild to moderate mitral annular calcification. Trivial  mitral valve regurgitation. No evidence of mitral valve stenosis.   Tricuspid Valve: The tricuspid valve is normal in structure. Tricuspid  valve regurgitation is not demonstrated.   Aortic Valve: The aortic valve is tricuspid. There is mild calcification  of the aortic valve. There is mild thickening of the aortic valve. Aortic  valve  sclerosis/calcification is present, without any evidence of aortic  stenosis.   Pulmonic Valve: The pulmonic valve was normal in structure. Pulmonic valve  regurgitation is not visualized.   Aorta: The aortic root is normal in size and structure.   Venous: The inferior vena cava is dilated in size with less than 50%  respiratory variability, suggesting right atrial pressure of 15 mmHg.   IAS/Shunts: No atrial level shunt detected by color flow Doppler.  Patient Profile     81 y.o. male hx of CAD s/p RCA stent in 2003, Chronic Heart Failure with EF of 25-30% , recent cath with non obstructive CAD and patent stent now admitted with acute on chronic CHF and hypoxemic respiratory failure.  Was discharged with Acute CHF on 05/21/22 and with a fib with RVR at that time and was in SR at discharge.  On admit back in a fib.  He did not show for his TOC appt.  Assessment & Plan    #Acute on Chronic Diastolic Heart Failure secondary to Hypertensive Emergency and NICM --BNP 3,560 >>2,826 now 1,337 --diuresing with IV lasix 40 mg IV BID  --neg 2,300 though intake listed wt has gone up if accurate. --continue coreg 6.25 BID, hydralazine 100 TID and imdur 60  --goal K+ 4.0 and Mg+ 2.0  --hs Troponin pk 42, demand ischemia from hypoxia and CHF --may need  home health RN to assist with meds once home  Atrial fib  --was in and out on last admit - in a fib on this admit.  Now a fib with RVR and at times SR with PACs on anticoagulation with eliqus  --His CHADVASC score is currently 6,  --increase coreg vs antiarrythmic  CAD s/p stent in 2003  Pt currently denies any chest pain. Troponins were mildly elevated upon admissions, but most likely from demand ischemia in the setting of HF exacerbation. He is on Lipitor 40mg , and Plavix 75mg . Continue current medical regimen.   HTN BP up to 195/93 today now 187/81. --increase hydralazine he is on 100 TID now along with coreg 6.25 BID has allergy to lisinopril        For questions or updates, please contact [place] Please consult [email or web address] for contact info under        Signed, Cecilie Kicks, NP  06/04/2022, 10:32 AM

## 2022-06-04 NOTE — Progress Notes (Signed)
[facility] PROGRESS NOTE    Progress Note  Albert Powell  [id number] DOB: 09-15-1941 DOA: 06/02/2022 PCP: Leeroy Cha, MD     Brief Narrative:   Albert Powell is an 81 y.o. male past medical history A-fib on Eliquis, chronic combined heart failure, COPD diabetes mellitus type 2, essential hypertension, chronic disease stage III recently discharged for decompensated heart failure with an echo showing an EF of 25% and regional wall motion abnormalities right and left heart cath on 05/20/2022 show moderate obstructive CAD. Presents to the ER complaining of nausea vomiting shortness of breath in the ER was found to have hypertensive emergency along with heart failure  Assessment/Plan:  Hypertensive emergency leading to Acute combined systolic and diastolic heart failure (with an EF of 25%): He was started on IV nitroglycerin and IV Lasix and Aldactone blood pressure and shortness of breath is improved. They were able to wean him to room air. He was started back on his Coreg, Imdur and hydralazine his nitroglycerin has been titrated off. Chest pain-free cardiology has been consulted due to bump in troponins. Etiology relates he may be noncompliant with his medication.  Probably well to diuresis. Negative about 3 L. Restart Entresto check a basic metabolic panel tomorrow morning.  CAD: Currently chest pain-free. Underwent left and right heart cath no significant obstruction.  Paroxysmal Atrial fibrillation ([name]) Rate controlled continue Coreg and Eliquis, chads Vascor greater than 3.  Chronic disease stage IIIb: Creatinine has remained at baseline.  Hypertensive urgency: See above for the details  Hypothyroidism: Due to Synthroid.  GERD: Continue PPI.  Dyslipidemia: Continue statins.  Diabetes mellitus type 2: Currently on sliding scale insulin.  Hypokalemia: Replete orally recheck tomorrow morning.   DVT prophylaxis: lovenox Family  Communication:none Status is: Inpatient Remains inpatient appropriate because: Acute heart failure    Code Status:     Code Status Orders  (From admission, onward)           Start     Ordered   06/03/22 0152  Full code  Continuous        06/03/22 0156           Code Status History     Date Active Date Inactive Code Status Order ID Comments User Context   05/13/2022 2122 05/21/2022 2234 Full Code [id number]  Bernadette Hoit, DO Inpatient   11/23/2021 0922 11/27/2021 1650 Full Code [id number]  Karmen Bongo, MD ED   08/05/2021 1847 08/09/2021 1738 DNR [id number]  Lequita Halt, MD ED   06/06/2021 2009 06/10/2021 1741 Full Code [id number]  Elmarie Shiley, MD Inpatient   03/10/2021 0635 03/12/2021 1908 DNR [id number]  Shela Leff, MD ED   01/01/2021 1953 01/03/2021 1936 DNR [id number]  Lenore Cordia, MD ED   08/27/2020 1620 08/28/2020 0032 Full Code [id number]  Lorretta Harp, MD Inpatient   03/30/2020 1535 04/03/2020 1544 Full Code [id number]  Karmen Bongo, MD ED   03/30/2018 1202 03/31/2018 1439 Full Code [id number]  Serafina Mitchell, MD Inpatient   06/02/2013 1252 06/03/2013 1334 Full Code [id number]  Gabriel Earing, PA-C Inpatient   02/04/2013 2243 02/08/2013 1241 Full Code [id number]  Rise Patience, MD Inpatient         IV Access:   Peripheral IV   Procedures and diagnostic studies:   DG Chest Port 1 View  Result Date: 06/03/2022 CLINICAL DATA:  Shortness of breath hypertension EXAM: PORTABLE CHEST 1 VIEW COMPARISON:  06/02/2022 FINDINGS: Hazy appearance of the bilateral lower  chest attributed to layering pleural fluid with atelectasis, also seen on a chest CT from yesterday. Normal heart size. Spinal cord stimulator over the midthoracic spine. IMPRESSION: Hazy opacification of the lower chest from atelectasis and pleural fluid by CT yesterday. Electronically Signed   By: Jorje Guild M.D.   On: 06/03/2022 06:44   CT Angio Chest PE W and/or Wo  Contrast  Result Date: 06/02/2022 CLINICAL DATA:  Dyspnea on exertion. Cough. Evaluate for pulmonary embolism. History of congestive heart failure, cardiac catheterization/stent, and diabetes. EXAM: CT ANGIOGRAPHY CHEST WITH CONTRAST TECHNIQUE: Multidetector CT imaging of the chest was performed using the standard protocol during bolus administration of intravenous contrast. Multiplanar CT image reconstructions and MIPs were obtained to evaluate the vascular anatomy. RADIATION DOSE REDUCTION: This exam was performed according to the departmental dose-optimization program which includes automated exposure control, adjustment of the mA and/or kV according to patient size and/or use of iterative reconstruction technique. CONTRAST:  75mL OMNIPAQUE IOHEXOL 350 MG/ML SOLN COMPARISON:  Chest two views 06/02/2022 and 05/14/2022 FINDINGS: Cardiovascular: The main pulmonary artery is opacified up to 421 Hounsfield units. No filling defect is seen to indicate an acute pulmonary embolism. No thoracic aortic aneurysm. Dense coronary artery calcifications. High-grade atherosclerotic calcifications throughout the thoracic aorta. Mediastinum/Nodes: No axillary lymphadenopathy. There are scattered subcentimeter short axis prevascular and right paratracheal lymph nodes. 11 mm short axis precarinal lymph node, likely reactive (axial series 5, image 130). 14 mm short axis subcarinal lymph node (axial series 5, image 155, coronal series 7, image 79), likely reactive. Mild right hilar likely lymph node soft tissue thickening. The visualized thyroid is unremarkable. Minimal diffuse esophageal wall thickening may be secondary to chronic inflammation. No focal mass is seen. Lungs/Pleura: The central airways are patent. Mild centrilobular emphysematous changes predominantly within the bilateral upper lungs. Moderate right and mild left pleural effusions with associated bilateral lower lobe compressive atelectasis. Upper Abdomen: There is  moderate left and right adrenal gland thickening. There is low-density approximate 11 mm left adrenal nodule (axial series 4 images [id number]) with central low density of -13 Hounsfield units suggesting a benign lipid rich adenoma. Minimally partially visualized likely fluid density cyst within the upper pole of the left kidney. The rest of the left kidney itself is not imaged. No follow-up imaging recommended. Musculoskeletal: Moderate multilevel degenerative disc changes of the thoracic spine. Severe C6-7 disc space narrowing. Mild retrolisthesis of C6 on C7. Moderate superior L1 and mild superior T10 vertebral body degenerative Schmorl's nodes. A spinal cord stimulator electrode is seen within the posterior aspect of the central canal at the T7 through T9 levels. Review of the MIP images confirms the above findings. IMPRESSION: 1. No acute pulmonary embolism is seen. 2. Moderate right and mild left pleural effusions with associated bilateral lower lobe compressive atelectasis. 3. Mildly enlarged precarinal lymph node, likely reactive. Aortic Atherosclerosis (ICD10-I70.0) and Emphysema (ICD10-J43.9). Electronically Signed   By: Yvonne Kendall M.D.   On: 06/02/2022 16:03   DG Chest 2 View  Result Date: 06/02/2022 CLINICAL DATA:  Shortness of breath EXAM: CHEST - 2 VIEW COMPARISON:  05/14/2022 FINDINGS: Mild bilateral interstitial thickening. No focal consolidation. No pleural effusion or pneumothorax. Heart and mediastinal contours are unremarkable. No acute osseous abnormality. Spinal stimulator noted. IMPRESSION: 1. Mild bilateral interstitial thickening which may reflect interstitial edema versus infection. Electronically Signed   By: Kathreen Devoid M.D.   On: 06/02/2022 13:44     Medical Consultants:   None.   Subjective:  [name] no complaints relates his breathing is significantly better today able to lay flat to sleep  Objective:    Vitals:   06/04/22 0505 06/04/22 0545 06/04/22  0756 06/04/22 1000  BP: (!) 195/93 (!) 195/93 (!) 169/88 (!) 187/81  Pulse:   93   Resp: 18  20 13   Temp:      TempSrc:      SpO2:      Weight:      Height:       SpO2: 94 % O2 Flow Rate (L/min): 2 L/min   Intake/Output Summary (Last 24 hours) at 06/04/2022 1151 Last data filed at 06/04/2022 1100 Gross per 24 hour  Intake --  Output 2200 ml  Net -2200 ml   Filed Weights   06/02/22 1305 06/03/22 0500 06/04/22 0500  Weight: 101.2 kg 99.1 kg 100.9 kg    Exam: General exam: In no acute distress. Respiratory system: Good air movement and clear to auscultation. Cardiovascular system: S1 & S2 heard, RRR. No JVD. Gastrointestinal system: Abdomen is nondistended, soft and nontender.  Extremities: No pedal edema. Skin: No rashes, lesions or ulcers Psychiatry: Judgement and insight appear normal. Mood & affect appropriate.    Data Reviewed:    Labs: Basic Metabolic Panel: Recent Labs  Lab 06/02/22 1313 06/03/22 0254 06/03/22 0606 06/04/22 0601  NA 136 138  --  136  K 3.2* 3.3*  --  3.4*  CL 99 102  --  102  CO2 24 26  --  22  GLUCOSE 163* 145*  --  127*  BUN 14 12  --  16  CREATININE 1.44* 1.45*  --  1.88*  CALCIUM 9.5 8.8*  --  8.5*  MG  --  1.9 2.0 1.9   GFR Estimated Creatinine Clearance: 37.3 mL/min (A) (by C-G formula based on SCr of 1.88 mg/dL (H)). Liver Function Tests: No results for input(s): "AST", "ALT", "ALKPHOS", "BILITOT", "PROT", "ALBUMIN" in the last 168 hours. No results for input(s): "LIPASE", "AMYLASE" in the last 168 hours. No results for input(s): "AMMONIA" in the last 168 hours. Coagulation profile No results for input(s): "INR", "PROTIME" in the last 168 hours. COVID-19 Labs  Recent Labs    06/02/22 1313  DDIMER 1.22*    Lab Results  Component Value Date   SARSCOV2NAA NEGATIVE 06/02/2022   SARSCOV2NAA NEGATIVE 05/13/2022   SARSCOV2NAA NEGATIVE 11/23/2021   [place] NEGATIVE 08/05/2021    CBC: Recent Labs  Lab  06/02/22 1313 06/03/22 0254 06/04/22 0601  WBC 11.4* 9.9 9.3  NEUTROABS  --   --  6.6  HGB 14.7 13.4 12.9*  HCT 43.6 40.6 38.1*  MCV 90.1 91.0 90.9  PLT 293 247 200   Cardiac Enzymes: No results for input(s): "CKTOTAL", "CKMB", "CKMBINDEX", "TROPONINI" in the last 168 hours. BNP (last 3 results) No results for input(s): "PROBNP" in the last 8760 hours. CBG: Recent Labs  Lab 06/03/22 1140 06/03/22 1558 06/03/22 2112 06/03/22 2144 06/04/22 0754  GLUCAP 241* 98 138* 134* 126*   D-Dimer: Recent Labs    06/02/22 1313  DDIMER 1.22*   Hgb A1c: No results for input(s): "HGBA1C" in the last 72 hours. Lipid Profile: No results for input(s): "CHOL", "HDL", "LDLCALC", "TRIG", "CHOLHDL", "LDLDIRECT" in the last 72 hours. Thyroid function studies: No results for input(s): "TSH", "T4TOTAL", "T3FREE", "THYROIDAB" in the last 72 hours.  Invalid input(s): "FREET3" Anemia work up: No results for input(s): "VITAMINB12", "FOLATE", "FERRITIN", "TIBC", "IRON", "RETICCTPCT" in the last 72 hours. Sepsis  Labs: Recent Labs  Lab 06/02/22 1313 06/03/22 0254 06/04/22 0601  WBC 11.4* 9.9 9.3   Microbiology Recent Results (from the past 240 hour(s))  Resp Panel by RT-PCR (Flu A&B, Covid) Anterior Nasal Swab     Status: None   Collection Time: 06/02/22  2:00 PM   Specimen: Anterior Nasal Swab  Result Value Ref Range Status   SARS Coronavirus 2 by RT PCR NEGATIVE NEGATIVE Final    Comment: (NOTE) SARS-CoV-2 target nucleic acids are NOT DETECTED.  The SARS-CoV-2 RNA is generally detectable in upper respiratory specimens during the acute phase of infection. The lowest concentration of SARS-CoV-2 viral copies this assay can detect is 138 copies/mL. A negative result does not preclude SARS-Cov-2 infection and should not be used as the sole basis for treatment or other patient management decisions. A negative result may occur with  improper specimen collection/handling, submission of  specimen other than nasopharyngeal swab, presence of viral mutation(s) within the areas targeted by this assay, and inadequate number of viral copies(<138 copies/mL). A negative result must be combined with clinical observations, patient history, and epidemiological information. The expected result is Negative.  Fact Sheet for Patients:  EntrepreneurPulse.com.au  Fact Sheet for Healthcare Providers:  IncredibleEmployment.be  This test is no t yet approved or cleared by the [place] FDA and  has been authorized for detection and/or diagnosis of SARS-CoV-2 by FDA under an Emergency Use Authorization (EUA). This EUA will remain  in effect (meaning this test can be used) for the duration of the COVID-19 declaration under Section 564(b)(1) of the Act, 21 U.S.C.section 360bbb-3(b)(1), unless the authorization is terminated  or revoked sooner.       Influenza A by PCR NEGATIVE NEGATIVE Final   Influenza B by PCR NEGATIVE NEGATIVE Final    Comment: (NOTE) The Xpert Xpress SARS-CoV-2/FLU/RSV plus assay is intended as an aid in the diagnosis of influenza from Nasopharyngeal swab specimens and should not be used as a sole basis for treatment. Nasal washings and aspirates are unacceptable for Xpert Xpress SARS-CoV-2/FLU/RSV testing.  Fact Sheet for Patients: EntrepreneurPulse.com.au  Fact Sheet for Healthcare Providers: IncredibleEmployment.be  This test is not yet approved or cleared by the [place] FDA and has been authorized for detection and/or diagnosis of SARS-CoV-2 by FDA under an Emergency Use Authorization (EUA). This EUA will remain in effect (meaning this test can be used) for the duration of the COVID-19 declaration under Section 564(b)(1) of the Act, 21 U.S.C. section 360bbb-3(b)(1), unless the authorization is terminated or revoked.  Performed at [facility], [address] [place], [place] [address]      Medications:    amLODipine  5 mg Oral Daily   apixaban  5 mg Oral BID   atorvastatin  40 mg Oral Daily   carvedilol  12.5 mg Oral BID WC   clopidogrel  75 mg Oral Daily   furosemide  40 mg Intravenous BID   hydrALAZINE  100 mg Oral Q8H   insulin aspart  0-5 Units Subcutaneous QHS   insulin aspart  0-9 Units Subcutaneous TID WC   isosorbide mononitrate  60 mg Oral Daily   levothyroxine  25 mcg Oral Q0600   pantoprazole  40 mg Oral QODAY   potassium chloride  40 mEq Oral Daily   spironolactone  25 mg Oral Daily   Continuous Infusions:    LOS: 2 days   Charlynne Cousins  [facility]  06/04/2022, 11:51 AM

## 2022-06-04 NOTE — Progress Notes (Signed)
Mobility Specialist Progress Note:   06/04/22 1140  Mobility  Activity Ambulated with assistance in hallway  Level of Assistance Contact guard assist, steadying assist  Assistive Device Front wheel walker  Distance Ambulated (ft) 200 ft  Activity Response Tolerated well  $Mobility charge 1 Mobility   Pt agreeable to mobility session. Required minG assist for safety. HR afib throughout with max of 120bpm, pt asx. Back in bed with all needs met.   Nelta Numbers Acute Rehab Secure Chat or Office Phone: [phone]

## 2022-06-04 NOTE — TOC Initial Note (Signed)
Transition of Care [facility]) - Initial/Assessment Note    Patient Details  Name: Albert Powell MRN: [id number] Date of Birth: 1940/12/31  [facility] CM/SW Contact:    Cyndi Bender, RN Phone Number: 06/04/2022, 3:30 PM  Clinical Narrative:                  Spoke to patient regarding transition needs.  Physical therapy is recommending home health. Patient declines HH. Patient states he lives with his wife and has all needed DME. Wife can transport him home. Patient can afford his medications.  No other TOC needs at this time.  Expected Discharge Plan: [place]     Patient Goals and CMS Choice      Return home  Expected Discharge Plan and Services Expected Discharge Plan: [place]                                              Prior Living Arrangements/Services                       Activities of Daily Living Home Assistive Devices/Equipment: Gilford Rile (specify type) ADL Screening (condition at time of admission) Patient's cognitive ability adequate to safely complete daily activities?: Yes Is the patient deaf or have difficulty hearing?: No Does the patient have difficulty seeing, even when wearing glasses/contacts?: No Does the patient have difficulty concentrating, remembering, or making decisions?: No Patient able to express need for assistance with ADLs?: Yes Does the patient have difficulty dressing or bathing?: No Independently performs ADLs?: Yes (appropriate for developmental age) Does the patient have difficulty walking or climbing stairs?: Yes Weakness of Legs: Both Weakness of Arms/Hands: None  Permission Sought/Granted                  Emotional Assessment              Admission diagnosis:  Shortness of breath [R06.02] NSTEMI (non-ST elevated myocardial infarction) ([place]) [I21.4] Hypertensive urgency [I16.0] Hypertensive emergency [I16.1] Essential hypertension [I10] Acute on chronic  congestive heart failure, unspecified heart failure type ([place]) [I50.9] Patient Active Problem List   Diagnosis Date Noted   Type 2 diabetes mellitus with hyperlipidemia ([place]) 05/19/2022   Acute combined systolic and diastolic heart failure ([place])    Acute kidney injury superimposed on chronic kidney disease ([place]) 05/13/2022   GERD (gastroesophageal reflux disease) 05/13/2022   Acquired hypothyroidism 05/13/2022   Acute on chronic diastolic CHF (congestive heart failure) ([place]) 11/23/2021   PAF (paroxysmal atrial fibrillation) ([place]) 10/08/2021   Acute on chronic diastolic heart failure ([place]) 07/14/1313   Acute metabolic encephalopathy    CHF (congestive heart failure) ([place]) 03/10/2021   Uncontrolled hypertension 03/10/2021   Hypertensive emergency 01/01/2021   ACS (acute coronary syndrome) ([place])    AKI (acute kidney injury) ([place])    Atrial fibrillation ([place]) 03/30/2020   Contusion of right hand 11/12/2018   Primary osteoarthritis of both first carpometacarpal joints 11/12/2018   Pain in joint of right shoulder 10/05/2018   Pain in right hand 10/05/2018   Hematoma 03/30/2018   Degeneration of lumbar intervertebral disc 12/21/2017   Lumbar radiculopathy 12/21/2017   Vertigo 04/09/2017   Hypogonadism in male 12/07/2014   Screening for prostate cancer 12/07/2014   Claudication of right lower extremity ([place]) 10/20/2013   Claudication in peripheral  vascular disease- Rt leg 09/20/2013   Obesity (BMI 30-39.9)- negative sleep study in the past 09/20/2013   Occlusion and stenosis of carotid artery without mention of cerebral infarction 05/23/2013   PAD (peripheral artery disease) ([name]) 05/18/2013   Carotid artery disease ([name]) 04/14/2013   Bradycardia, sinus 02/18/2013   Tobacco abuse 02/05/2013   Chest pain, unstable angina, negative MI 02/04/2013   Type 2 diabetes mellitus ([name]) 02/04/2013   Coronary artery disease involving native coronary artery of native heart without angina pectoris  02/04/2013   Benign essential hypertension 07/10/2011   Incisional hernia 05/22/2011   PCP:  [name], MD Pharmacy:   [facility] [address] [place] [address] Phone: [phone] Fax: [phone]  [facility] [place], [place] - [address] [place] [address] Phone: [phone] Fax: [phone]  [facility] [address] [place] [address] Phone: [phone] Fax: [phone]     Social Determinants of Health (SDOH) Interventions Food Insecurity Interventions: Intervention Not Indicated Housing Interventions: Intervention Not Indicated Transportation Interventions: Intervention Not Indicated Utilities Interventions: Intervention Not Indicated Alcohol Usage Interventions: Intervention Not Indicated (Score <7) Financial Strain Interventions: Intervention Not Indicated  Readmission Risk Interventions     No data to display

## 2022-06-05 DIAGNOSIS — I161 Hypertensive emergency: Secondary | ICD-10-CM | POA: Diagnosis not present

## 2022-06-05 DIAGNOSIS — I480 Paroxysmal atrial fibrillation: Secondary | ICD-10-CM | POA: Diagnosis not present

## 2022-06-05 DIAGNOSIS — I5041 Acute combined systolic (congestive) and diastolic (congestive) heart failure: Secondary | ICD-10-CM | POA: Diagnosis not present

## 2022-06-05 LAB — GLUCOSE, CAPILLARY
Glucose-Capillary: 127 mg/dL — ABNORMAL HIGH (ref 70–99)
Glucose-Capillary: 186 mg/dL — ABNORMAL HIGH (ref 70–99)
Glucose-Capillary: 145 mg/dL — ABNORMAL HIGH (ref 70–99)
Glucose-Capillary: 194 mg/dL — ABNORMAL HIGH (ref 70–99)

## 2022-06-05 LAB — BRAIN NATRIURETIC PEPTIDE: B Natriuretic Peptide: 943.1 pg/mL — ABNORMAL HIGH (ref 0.0–100.0)

## 2022-06-05 LAB — CBC WITH DIFFERENTIAL/PLATELET
Abs Immature Granulocytes: 0.04 10*3/uL (ref 0.00–0.07)
Basophils Absolute: 0 10*3/uL (ref 0.0–0.1)
Basophils Relative: 1 %
Eosinophils Absolute: 0.3 10*3/uL (ref 0.0–0.5)
Eosinophils Relative: 4 %
HCT: 40.2 % (ref 39.0–52.0)
Hemoglobin: 13.6 g/dL (ref 13.0–17.0)
Immature Granulocytes: 1 %
Lymphocytes Relative: 16 %
Lymphs Abs: 1.3 10*3/uL (ref 0.7–4.0)
MCH: 30.4 pg (ref 26.0–34.0)
MCHC: 33.8 g/dL (ref 30.0–36.0)
MCV: 89.7 fL (ref 80.0–100.0)
Monocytes Absolute: 0.7 10*3/uL (ref 0.1–1.0)
Monocytes Relative: 9 %
Neutro Abs: 5.8 10*3/uL (ref 1.7–7.7)
Neutrophils Relative %: 69 %
Platelets: 254 10*3/uL (ref 150–400)
RBC: 4.48 MIL/uL (ref 4.22–5.81)
RDW: 14.1 % (ref 11.5–15.5)
WBC: 8.1 10*3/uL (ref 4.0–10.5)
nRBC: 0 % (ref 0.0–0.2)

## 2022-06-05 LAB — BASIC METABOLIC PANEL
Anion gap: 9 (ref 5–15)
BUN: 21 mg/dL (ref 8–23)
CO2: 25 mmol/L (ref 22–32)
Calcium: 8.7 mg/dL — ABNORMAL LOW (ref 8.9–10.3)
Chloride: 101 mmol/L (ref 98–111)
Creatinine, Ser: 1.62 mg/dL — ABNORMAL HIGH (ref 0.61–1.24)
GFR, Estimated: 43 mL/min — ABNORMAL LOW (ref >60)
Glucose, Bld: 131 mg/dL — ABNORMAL HIGH (ref 70–99)
Potassium: 3.9 mmol/L (ref 3.5–5.1)
Sodium: 135 mmol/L (ref 135–145)

## 2022-06-05 LAB — MAGNESIUM: Magnesium: 1.9 mg/dL (ref 1.7–2.4)

## 2022-06-05 MED ORDER — FUROSEMIDE 40 MG PO TABS
80.0000 mg | ORAL_TABLET | Freq: Every day | ORAL | Status: DC
  Administered 2022-06-05 – 2022-06-06 (×2): 80 mg via ORAL
  Filled 2022-06-05 (×2): qty 2

## 2022-06-05 MED ORDER — FUROSEMIDE 40 MG PO TABS: 40.0000 mg | ORAL_TABLET | Freq: Every day | ORAL | Status: DC

## 2022-06-05 NOTE — Progress Notes (Signed)
[facility] PROGRESS NOTE    Progress Note  Albert Powell  FIE:[id number] DOB: [birth year] DOA: 06/02/2022 PCP: [name], MD     Brief Narrative:   Albert Powell is an 81 y.o. male past medical history A-fib on Eliquis, chronic combined heart failure, COPD diabetes mellitus type 2, essential hypertension, chronic disease stage III recently discharged for decompensated heart failure with an echo showing an EF of 25% and regional wall motion abnormalities right and left heart cath on 05/20/2022 show moderate obstructive CAD. Presents to the ER complaining of nausea vomiting shortness of breath in the ER was found to have hypertensive emergency along with heart failure  Assessment/Plan:  Hypertensive emergency leading to Acute combined systolic and diastolic heart failure (with an EF of 25%): He was started on IV nitroglycerin and IV Lasix and Aldactone blood pressure and shortness of breath is improved. They were able to wean him to room air. He was started back on his Coreg, Imdur and hydralazine his nitroglycerin has been titrated off. Chest pain-free cardiology has been consulted due to bump in troponins. Etiology relates he may be noncompliant with his medication.  Tolerated diuresis well.   Negative about 3 L. Started on [place], cardiology transition his Lasix to orals per cards. Creatinine has remained stable, is actually improved from yesterday.  CAD: Currently chest pain-free. Underwent left and right heart cath no significant obstruction.  Paroxysmal Atrial fibrillation ([name]) Rate controlled continue Coreg and Eliquis, chads Vascor greater than 3.  Chronic disease stage IIIb: Creatinine has remained at baseline.  Hypertensive urgency: See above for the details  Hypothyroidism: Due to Synthroid.  GERD: Continue PPI.  Dyslipidemia: Continue statins.  Diabetes mellitus type 2: Currently on sliding scale insulin.  Hypokalemia: Replete  orally recheck tomorrow morning.   DVT prophylaxis: lovenox Family Communication:none Status is: Inpatient Remains inpatient appropriate because: Acute heart failure    Code Status:     Code Status Orders  (From admission, onward)           Start     Ordered   06/03/22 0152  Full code  Continuous        06/03/22 0156           Code Status History     Date Active Date Inactive Code Status Order ID Comments User Context   05/13/2022 2122 05/21/2022 2234 Full Code [id number]  Frankey Shown, DO Inpatient   11/23/2021 0922 11/27/2021 1650 Full Code [id number]  Jonah Blue, MD ED   08/05/2021 1847 08/09/2021 1738 DNR [id number]  Emeline General, MD ED   06/06/2021 2009 06/10/2021 1741 Full Code [id number]  Alba Cory, MD Inpatient   03/10/2021 0635 03/12/2021 1908 DNR [id number]  John Giovanni, MD ED   01/01/2021 1953 01/03/2021 1936 DNR [id number]  Charlsie Quest, MD ED   08/27/2020 1620 08/28/2020 0032 Full Code [id number]  [name], MD Inpatient   03/30/2020 1535 04/03/2020 1544 Full Code [id number]  Jonah Blue, MD ED   03/30/2018 1202 03/31/2018 1439 Full Code [id number]  Nada Libman, MD Inpatient   06/02/2013 1252 06/03/2013 1334 Full Code [id number]  Dara Lords PA-C Inpatient   02/04/2013 2243 02/08/2013 1241 Full Code [id number]  Eduard Clos, MD Inpatient         IV Access:   Peripheral IV   Procedures and diagnostic studies:   No results found.   Medical Consultants:   None.   Subjective:    Albert Powell  relates his breathing is better  Objective:    Vitals:   06/04/22 2000 06/04/22 2200 06/05/22 0345 06/05/22 0815  BP: 133/60 (!) 144/62 (!) 126/58 126/76  Pulse: 62  60 81  Resp: 20 19 16 17   Temp: 97.9 F (36.6 C)  98 F (36.7 C) 97.9 F (36.6 C)  TempSrc: Oral  Oral Oral  SpO2: 97% 97% 95% 97%  Weight:      Height:       SpO2: 97 % O2 Flow Rate (L/min): 2 L/min   Intake/Output Summary (Last 24  hours) at 06/05/2022 1045 Last data filed at 06/04/2022 1500 Gross per 24 hour  Intake --  Output 800 ml  Net -800 ml    Filed Weights   06/02/22 1305 06/03/22 0500 06/04/22 0500  Weight: 101.2 kg 99.1 kg 100.9 kg    Exam: General exam: In no acute distress. Respiratory system: Good air movement and clear to auscultation. Cardiovascular system: S1 & S2 heard, RRR. No JVD. Gastrointestinal system: Abdomen is nondistended, soft and nontender.  Extremities: No pedal edema. Skin: No rashes, lesions or ulcers Psychiatry: Judgement and insight appear normal. Mood & affect appropriate. Data Reviewed:    Labs: Basic Metabolic Panel: Recent Labs  Lab 06/02/22 1313 06/03/22 0254 06/03/22 0606 06/04/22 0601 06/05/22 0254  NA 136 138  --  136 135  K 3.2* 3.3*  --  3.4* 3.9  CL 99 102  --  102 101  CO2 24 26  --  22 25  GLUCOSE 163* 145*  --  127* 131*  BUN 14 12  --  16 21  CREATININE 1.44* 1.45*  --  1.88* 1.62*  CALCIUM 9.5 8.8*  --  8.5* 8.7*  MG  --  1.9 2.0 1.9 1.9    GFR Estimated Creatinine Clearance: 43.3 mL/min (A) (by C-G formula based on SCr of 1.62 mg/dL (H)). Liver Function Tests: No results for input(s): "AST", "ALT", "ALKPHOS", "BILITOT", "PROT", "ALBUMIN" in the last 168 hours. No results for input(s): "LIPASE", "AMYLASE" in the last 168 hours. No results for input(s): "AMMONIA" in the last 168 hours. Coagulation profile No results for input(s): "INR", "PROTIME" in the last 168 hours. COVID-19 Labs  Recent Labs    06/02/22 1313  DDIMER 1.22*     Lab Results  Component Value Date   SARSCOV2NAA NEGATIVE 06/02/2022   SARSCOV2NAA NEGATIVE 05/13/2022   SARSCOV2NAA NEGATIVE 11/23/2021   [place] NEGATIVE 08/05/2021    CBC: Recent Labs  Lab 06/02/22 1313 06/03/22 0254 06/04/22 0601 06/05/22 0254  WBC 11.4* 9.9 9.3 8.1  NEUTROABS  --   --  6.6 5.8  HGB 14.7 13.4 12.9* 13.6  HCT 43.6 40.6 38.1* 40.2  MCV 90.1 91.0 90.9 89.7  PLT 293 247 200  254    Cardiac Enzymes: No results for input(s): "CKTOTAL", "CKMB", "CKMBINDEX", "TROPONINI" in the last 168 hours. BNP (last 3 results) No results for input(s): "PROBNP" in the last 8760 hours. CBG: Recent Labs  Lab 06/04/22 0754 06/04/22 1203 06/04/22 1519 06/04/22 2050 06/05/22 0814  GLUCAP 126* 168* 131* 209* 127*    D-Dimer: Recent Labs    06/02/22 1313  DDIMER 1.22*    Hgb A1c: No results for input(s): "HGBA1C" in the last 72 hours. Lipid Profile: No results for input(s): "CHOL", "HDL", "LDLCALC", "TRIG", "CHOLHDL", "LDLDIRECT" in the last 72 hours. Thyroid function studies: No results for input(s): "TSH", "T4TOTAL", "T3FREE", "THYROIDAB" in the last 72 hours.  Invalid input(s): "FREET3" Anemia work up:  No results for input(s): "VITAMINB12", "FOLATE", "FERRITIN", "TIBC", "IRON", "RETICCTPCT" in the last 72 hours. Sepsis Labs: Recent Labs  Lab 06/02/22 1313 06/03/22 0254 06/04/22 0601 06/05/22 0254  WBC 11.4* 9.9 9.3 8.1    Microbiology Recent Results (from the past 240 hour(s))  Resp Panel by RT-PCR (Flu A&B, Covid) Anterior Nasal Swab     Status: None   Collection Time: 06/02/22  2:00 PM   Specimen: Anterior Nasal Swab  Result Value Ref Range Status   SARS Coronavirus 2 by RT PCR NEGATIVE NEGATIVE Final    Comment: (NOTE) SARS-CoV-2 target nucleic acids are NOT DETECTED.  The SARS-CoV-2 RNA is generally detectable in upper respiratory specimens during the acute phase of infection. The lowest concentration of SARS-CoV-2 viral copies this assay can detect is 138 copies/mL. A negative result does not preclude SARS-Cov-2 infection and should not be used as the sole basis for treatment or other patient management decisions. A negative result may occur with  improper specimen collection/handling, submission of specimen other than nasopharyngeal swab, presence of viral mutation(s) within the areas targeted by this assay, and inadequate number of  viral copies(<138 copies/mL). A negative result must be combined with clinical observations, patient history, and epidemiological information. The expected result is Negative.  Fact Sheet for Patients:  EntrepreneurPulse.com.au  Fact Sheet for Healthcare Providers:  IncredibleEmployment.be  This test is no t yet approved or cleared by the [place] FDA and  has been authorized for detection and/or diagnosis of SARS-CoV-2 by FDA under an Emergency Use Authorization (EUA). This EUA will remain  in effect (meaning this test can be used) for the duration of the COVID-19 declaration under Section 564(b)(1) of the Act, 21 U.S.C.section 360bbb-3(b)(1), unless the authorization is terminated  or revoked sooner.       Influenza A by PCR NEGATIVE NEGATIVE Final   Influenza B by PCR NEGATIVE NEGATIVE Final    Comment: (NOTE) The Xpert Xpress SARS-CoV-2/FLU/RSV plus assay is intended as an aid in the diagnosis of influenza from Nasopharyngeal swab specimens and should not be used as a sole basis for treatment. Nasal washings and aspirates are unacceptable for Xpert Xpress SARS-CoV-2/FLU/RSV testing.  Fact Sheet for Patients: EntrepreneurPulse.com.au  Fact Sheet for Healthcare Providers: IncredibleEmployment.be  This test is not yet approved or cleared by the [place] FDA and has been authorized for detection and/or diagnosis of SARS-CoV-2 by FDA under an Emergency Use Authorization (EUA). This EUA will remain in effect (meaning this test can be used) for the duration of the COVID-19 declaration under Section 564(b)(1) of the Act, 21 U.S.C. section 360bbb-3(b)(1), unless the authorization is terminated or revoked.  Performed at [facility], [place], [place] [address]      Medications:    amLODipine  5 mg Oral Daily   apixaban  5 mg Oral BID   atorvastatin  40 mg  Oral Daily   carvedilol  12.5 mg Oral BID WC   clopidogrel  75 mg Oral Daily   furosemide  40 mg Intravenous Daily   hydrALAZINE  100 mg Oral Q8H   insulin aspart  0-5 Units Subcutaneous QHS   insulin aspart  0-9 Units Subcutaneous TID WC   isosorbide mononitrate  60 mg Oral Daily   levothyroxine  25 mcg Oral Q0600   pantoprazole  40 mg Oral QODAY   potassium chloride  40 mEq Oral Daily   sacubitril-valsartan  1 tablet Oral BID   spironolactone  25 mg Oral Daily  Continuous Infusions:    LOS: 3 days   Charlynne Cousins  [facility]  06/05/2022, 10:45 AM

## 2022-06-05 NOTE — Plan of Care (Signed)
  Problem: Education: Goal: Understanding of CV disease, CV risk reduction, and recovery process will improve Outcome: Progressing Goal: Individualized Educational Video(s) Outcome: Progressing   

## 2022-06-05 NOTE — Progress Notes (Signed)
Rounding Note    Patient Name: Albert Powell Date of Encounter: 06/05/2022  [place] Cardiologist: Quay Burow, MD   Subjective   Feeling well. No chest pain, sob or palpitations.   Wants to go home.  Inpatient Medications    Scheduled Meds:  amLODipine  5 mg Oral Daily   apixaban  5 mg Oral BID   atorvastatin  40 mg Oral Daily   carvedilol  12.5 mg Oral BID WC   clopidogrel  75 mg Oral Daily   furosemide  40 mg Intravenous Daily   hydrALAZINE  100 mg Oral Q8H   insulin aspart  0-5 Units Subcutaneous QHS   insulin aspart  0-9 Units Subcutaneous TID WC   isosorbide mononitrate  60 mg Oral Daily   levothyroxine  25 mcg Oral Q0600   pantoprazole  40 mg Oral QODAY   potassium chloride  40 mEq Oral Daily   sacubitril-valsartan  1 tablet Oral BID   spironolactone  25 mg Oral Daily   Continuous Infusions:  PRN Meds: acetaminophen **OR** acetaminophen, hydrALAZINE   Vital Signs    Vitals:   06/04/22 2000 06/04/22 2200 06/05/22 0345 06/05/22 0815  BP: 133/60 (!) 144/62 (!) 126/58 126/76  Pulse: 62  60 81  Resp: 20 19 16 17   Temp: 97.9 F (36.6 C)  98 F (36.7 C) 97.9 F (36.6 C)  TempSrc: Oral  Oral Oral  SpO2: 97% 97% 95% 97%  Weight:      Height:        Intake/Output Summary (Last 24 hours) at 06/05/2022 1025 Last data filed at 06/04/2022 1500 Gross per 24 hour  Intake --  Output 800 ml  Net -800 ml      06/04/2022    5:00 AM 06/03/2022    5:00 AM 06/02/2022    1:05 PM  Last 3 Weights  Weight (lbs) 222 lb 7.1 oz 218 lb 7.6 oz 223 lb  Weight (kg) 100.9 kg 99.1 kg 101.152 kg      Telemetry    SR with PAC, intermittent afib  - Personally Reviewed  ECG    N/A  Physical Exam   GEN: No acute distress.   Neck: No JVD Cardiac: irregular, no murmurs, rubs, or gallops.  Respiratory: Clear to auscultation bilaterally. GI: Soft, nontender, non-distended  MS: No edema; No deformity. Neuro:  Nonfocal  Psych: Normal affect   Labs     High Sensitivity Troponin:   Recent Labs  Lab 05/13/22 2131 06/02/22 1313 06/02/22 1539 06/02/22 1834 06/02/22 2312  TROPONINIHS 85* <2 37* 35* 42*     Chemistry Recent Labs  Lab 06/03/22 0254 06/03/22 0606 06/04/22 0601 06/05/22 0254  NA 138  --  136 135  K 3.3*  --  3.4* 3.9  CL 102  --  102 101  CO2 26  --  22 25  GLUCOSE 145*  --  127* 131*  BUN 12  --  16 21  CREATININE 1.45*  --  1.88* 1.62*  CALCIUM 8.8*  --  8.5* 8.7*  MG 1.9 2.0 1.9 1.9  GFRNONAA 49*  --  36* 43*  ANIONGAP 10  --  12 9    Lipids No results for input(s): "CHOL", "TRIG", "HDL", "LABVLDL", "LDLCALC", "CHOLHDL" in the last 168 hours.  Hematology Recent Labs  Lab 06/03/22 0254 06/04/22 0601 06/05/22 0254  WBC 9.9 9.3 8.1  RBC 4.46 4.19* 4.48  HGB 13.4 12.9* 13.6  HCT 40.6 38.1* 40.2  MCV 91.0 90.9 89.7  MCH 30.0 30.8 30.4  MCHC 33.0 33.9 33.8  RDW 14.2 14.1 14.1  PLT 247 200 254   Thyroid No results for input(s): "TSH", "FREET4" in the last 168 hours.  BNP Recent Labs  Lab 06/03/22 0615 06/04/22 0601 06/05/22 0254  BNP 2,826.1* 1,337.2* 943.1*    DDimer  Recent Labs  Lab 06/02/22 1313  DDIMER 1.22*     Radiology    No results found.  Cardiac Studies   Cardiac cath 05/20/22   Prox LAD to Mid LAD lesion is 40% stenosed.   Prox Cx to Mid Cx lesion is 30% stenosed.   Prox RCA to Mid RCA lesion is 60% stenosed.   1st Diag lesion is 60% stenosed.   Dist LAD lesion is 40% stenosed.   Non-stenotic Ost RCA to Prox RCA lesion was previously treated.   1.  Mild to moderate obstructive coronary artery disease; compared with the previous study the angiographic appearance looks relatively improved.  Overall the burden of coronary artery disease does not explain the patient's severe cardiomyopathy.   2.  Mean RA pressure of 5 mmHg and mean wedge pressure of 5 mmHg with normal cardiac output of 5.9 L/min and cardiac index of 2.7 L/min/m.   Recommendation: Goal-directed medical  therapy.   Diagnostic Dominance: Right ECHO     Echo 05/14/22     1. Left ventricular ejection fraction, by estimation, is 25 to 30%. The  left ventricle has severely decreased function. The left ventricle  demonstrates regional wall motion abnormalities (see scoring  diagram/findings for description).   2. Right ventricular systolic function is normal. The right ventricular  size is normal. Tricuspid regurgitation signal is inadequate for assessing  PA pressure.   3. Trivial mitral valve regurgitation. No evidence of mitral stenosis.   4. The aortic valve is tricuspid. There is mild calcification of the  aortic valve. There is mild thickening of the aortic valve. Aortic valve  sclerosis/calcification is present, without any evidence of aortic  stenosis.   5. The inferior vena cava is dilated in size with <50% respiratory  variability, suggesting right atrial pressure of 15 mmHg.   Comparison(s): Prior images reviewed side by side. The left ventricular  function is significantly worse. The left ventricular wall motion  abnormalities are significantly worse.    Patient Profile     81 y.o. male  with a history of coronary disease status post RCA stent in 2003, PAF, chronic systolic CHF,  COPD, DM, HTN, CKD who presented for SOB, nausea and vomiting. Admitted for hypertensive urgency, CHF and afib RVR.   Recently admitted earlier this month for acute systolic congestive heart failure.  LVEF had declined from 50 to 55% down to 25 to 30%.  He underwent cardiac catheterization which showed moderate nonobstructive coronary disease and patent stents and medical therapy was recommended.  Assessment & Plan    1.  Acute on chronic combined CHF -BNP 3560 on admit.  Treated with IV Lasix.  BNP now improved to 943.  Net INO -3.1 L. Weight 223>>222lb today.  -Appears euvolemic -Continue carvedilol 12.5 mg twice daily -Continue hydralazine 100 mg 3 times daily - Continue isosorbide 60 mg  daily -Continue Entresto 97/103 mg twice daily -Continue spironolactone 25 mg daily -On IV Lasix 40 mg daily >> changed to p.o. dose per MD.   2.  Hypertensive urgency -Blood pressure has normalized on current medication -Continue current regimen   3.  Paroxysmal atrial fibrillation  with rapid ventricular rate -Intermittent A-fib with rapid ventricular rate last admission as well as this admit. -Now rate has improved. -Seems currently in sinus with frequent PAC -Continue carvedilol and Eliquis   Patient is like to go home.  Has follow-up with heart failure clinic 10/4.  He will need education about medications at discharge.  For questions or updates, please contact [facility] Please consult [email or web address] for contact info under        SignedManson Passey, PA  06/05/2022, 10:25 AM

## 2022-06-06 ENCOUNTER — Other Ambulatory Visit (HOSPITAL_COMMUNITY): Payer: Self-pay

## 2022-06-06 DIAGNOSIS — E785 Hyperlipidemia, unspecified: Secondary | ICD-10-CM

## 2022-06-06 DIAGNOSIS — I5023 Acute on chronic systolic (congestive) heart failure: Secondary | ICD-10-CM | POA: Diagnosis not present

## 2022-06-06 DIAGNOSIS — I2510 Atherosclerotic heart disease of native coronary artery without angina pectoris: Secondary | ICD-10-CM

## 2022-06-06 DIAGNOSIS — E669 Obesity, unspecified: Secondary | ICD-10-CM

## 2022-06-06 DIAGNOSIS — I480 Paroxysmal atrial fibrillation: Secondary | ICD-10-CM | POA: Diagnosis not present

## 2022-06-06 DIAGNOSIS — E1169 Type 2 diabetes mellitus with other specified complication: Secondary | ICD-10-CM

## 2022-06-06 DIAGNOSIS — E039 Hypothyroidism, unspecified: Secondary | ICD-10-CM | POA: Diagnosis not present

## 2022-06-06 DIAGNOSIS — K219 Gastro-esophageal reflux disease without esophagitis: Secondary | ICD-10-CM

## 2022-06-06 LAB — CBC WITH DIFFERENTIAL/PLATELET
Abs Immature Granulocytes: 0.04 10*3/uL (ref 0.00–0.07)
Basophils Absolute: 0 10*3/uL (ref 0.0–0.1)
Basophils Relative: 0 %
Eosinophils Absolute: 0.3 10*3/uL (ref 0.0–0.5)
Eosinophils Relative: 4 %
HCT: 39 % (ref 39.0–52.0)
Hemoglobin: 13.4 g/dL (ref 13.0–17.0)
Immature Granulocytes: 1 %
Lymphocytes Relative: 17 %
Lymphs Abs: 1.4 10*3/uL (ref 0.7–4.0)
MCH: 31 pg (ref 26.0–34.0)
MCHC: 34.4 g/dL (ref 30.0–36.0)
MCV: 90.3 fL (ref 80.0–100.0)
Monocytes Absolute: 0.7 10*3/uL (ref 0.1–1.0)
Monocytes Relative: 9 %
Neutro Abs: 5.4 10*3/uL (ref 1.7–7.7)
Neutrophils Relative %: 69 %
Platelets: 240 10*3/uL (ref 150–400)
RBC: 4.32 MIL/uL (ref 4.22–5.81)
RDW: 14.3 % (ref 11.5–15.5)
WBC: 7.9 10*3/uL (ref 4.0–10.5)
nRBC: 0 % (ref 0.0–0.2)

## 2022-06-06 LAB — GLUCOSE, CAPILLARY
Glucose-Capillary: 144 mg/dL — ABNORMAL HIGH (ref 70–99)
Glucose-Capillary: 139 mg/dL — ABNORMAL HIGH (ref 70–99)

## 2022-06-06 LAB — BASIC METABOLIC PANEL
Anion gap: 11 (ref 5–15)
BUN: 26 mg/dL — ABNORMAL HIGH (ref 8–23)
CO2: 21 mmol/L — ABNORMAL LOW (ref 22–32)
Calcium: 8.5 mg/dL — ABNORMAL LOW (ref 8.9–10.3)
Chloride: 102 mmol/L (ref 98–111)
Creatinine, Ser: 1.71 mg/dL — ABNORMAL HIGH (ref 0.61–1.24)
GFR, Estimated: 40 mL/min — ABNORMAL LOW (ref >60)
Glucose, Bld: 130 mg/dL — ABNORMAL HIGH (ref 70–99)
Potassium: 4.2 mmol/L (ref 3.5–5.1)
Sodium: 134 mmol/L — ABNORMAL LOW (ref 135–145)

## 2022-06-06 LAB — MAGNESIUM: Magnesium: 1.9 mg/dL (ref 1.7–2.4)

## 2022-06-06 LAB — BRAIN NATRIURETIC PEPTIDE: B Natriuretic Peptide: 196.2 pg/mL — ABNORMAL HIGH (ref 0.0–100.0)

## 2022-06-06 MED ORDER — CARVEDILOL 12.5 MG PO TABS
12.5000 mg | ORAL_TABLET | Freq: Two times a day (BID) | ORAL | 0 refills | Status: DC
  Filled 2022-06-06: qty 60, 30d supply, fill #0

## 2022-06-06 MED ORDER — AMLODIPINE BESYLATE 5 MG PO TABS
5.0000 mg | ORAL_TABLET | Freq: Every day | ORAL | 0 refills | Status: DC
  Filled 2022-06-06: qty 30, 30d supply, fill #0

## 2022-06-06 MED ORDER — SPIRONOLACTONE 25 MG PO TABS
25.0000 mg | ORAL_TABLET | Freq: Every day | ORAL | 0 refills | Status: DC
  Filled 2022-06-06: qty 30, 30d supply, fill #0

## 2022-06-06 MED ORDER — FUROSEMIDE 80 MG PO TABS
80.0000 mg | ORAL_TABLET | Freq: Every day | ORAL | 0 refills | Status: DC
  Filled 2022-06-06: qty 30, 30d supply, fill #0

## 2022-06-06 NOTE — Plan of Care (Signed)
  Problem: Education: Goal: Understanding of CV disease, CV risk reduction, and recovery process will improve Outcome: Progressing Goal: Individualized Educational Video(s) Outcome: Progressing   

## 2022-06-06 NOTE — Progress Notes (Signed)
Patient left floor with wife with hospital transport in a wheelchair and all personal belongings and discharge paperwork and prescription meds included from pharmacy.

## 2022-06-06 NOTE — Assessment & Plan Note (Signed)
Recent cardiac catheterization with mild to moderate coronary artery disease. Elevated troponin due to heart failure decompensation.   Plan to continue medical therapy with clopidogrel and statin therapy. Continue blood pressure control. Patient is chest pain free.

## 2022-06-06 NOTE — Assessment & Plan Note (Signed)
Continue with antiacid therapy.  ?

## 2022-06-06 NOTE — Progress Notes (Addendum)
Mobility Specialist Progress Note:   06/06/22 1040  Mobility  Activity Ambulated with assistance in hallway  Level of Assistance Contact guard assist, steadying assist  Assistive Device Front wheel walker  Distance Ambulated (ft) 250 ft  Activity Response Tolerated well  $Mobility charge 1 Mobility   Pt agreeable to mobility session. MinG assist required for safety. Pt asx throughout session. HR 70s throughout. Back in bed with all needs met, bed alarm on.   Nelta Numbers Acute Rehab Secure Chat or Office Phone: [phone]

## 2022-06-06 NOTE — Assessment & Plan Note (Signed)
Continue with levothyroxine  

## 2022-06-06 NOTE — Assessment & Plan Note (Addendum)
Paroxysmal atrial fibrillation.  Patient remained rate control atrial fibrillation Plan to continue with carvedilol and anticoagulation with apixaban.

## 2022-06-06 NOTE — Discharge Summary (Signed)
Physician Discharge Summary   Patient: Albert Powell MRN: [id number] DOB: Nov 24, 1940  Admit date:     06/02/2022  Discharge date: 06/06/22  Discharge Physician: Jimmy Picket Jaziya Obarr   PCP: Leeroy Cha, MD   Recommendations at discharge:    Dose of carvedilol and spironolactone have been increased. Added low dose amlodipine Increased dose of furosemide to 80 mg po daily and instructions to take twice daily in case of weight gain 2 to 3 lbs in 24 hrs or 5 lbs in 7 days.   Discharge Diagnoses: Principal Problem:   Acute on chronic systolic CHF (congestive heart failure) ([name]) Active Problems:   Atrial fibrillation ([name])   Acute kidney injury superimposed on chronic kidney disease ([name])   Coronary artery disease involving native coronary artery of native heart without angina pectoris   Acquired hypothyroidism   GERD (gastroesophageal reflux disease)   Hypertensive emergency   Obesity (BMI 30-39.9)- negative sleep study in the past   Type 2 diabetes mellitus with hyperlipidemia ([place])  Resolved Problems:   * No resolved hospital problems. [facility] Course: [name] was admitted to the hospital with the working diagnosis of decompensated heart failure.   81 yo male with the past medical history of atrial fibrillation, heart failure, COPD, GERD, T2DM, hypertension, dyslipidemia, and obesity who presented with dyspnea. Recent hospitalization for heart failure exacerbation 08/29 to 05/21/22, at the time of his discharge he had low filling pressures per cardiac catheterization. He reported recurrent progressive dyspnea and orthopnea a few days prior to coming back to the ED. At home had increase water intake. On his initial physical examination his blood pressure was 183/113 HR 74, RR 17 and 02 saturation 93%, lungs with rales bilaterally, heart with S1 and S2 present irregular, abdomen with no distention, positive lower extremity edema.   Na 136, K 3,2 CL 99  bicarbonate 24, glucose 163 bun 14 cr 1,44 BNP 3,560 High sensitive troponin <2. 37, 35, 42 Wbc 11,4 hgb 14.3 plt 293  Sars COVID 19 negative   Chest radiograph with no cardiomegaly, bilateral hilar vascular congestion with cephalization of the vasculature.  CT chest with bilateral ground glass opacities, with bilateral pleural effusions more right than left with compression atelectasis.   EKG 119 bpm, left axis deviation, normal qtc, atrial fibrillation rhythm with no significant ST segment or T wave changes.   Patient was placed on nitroglycerin drip and diuresis with IV furosemide with improvement in his symptoms.  Oral blood pressure medications were resume and IV nitroglycerin was weaned off with good toleration.   Assessment and Plan: * Acute on chronic systolic CHF (congestive heart failure) ([name]) Echocardiogram with reduced LV systolic function with EF 25 to 30%. Severe hypokinesis of the left ventricular basal mid inferoseptal wall, inferior wall and antero lateral wall. Mild hypokinesis of the left ventricular, entire antero septal wasll, anterior wall and apical segment.  RV systolic function preserved, no significant valvular disease. Trivial pericardial effusion.   Patient was placed on IV furosemide for diuresis, negative fluid balance was achieved, -4,360 ml, with significant improvement in his symptoms.   Patient will continue heart failure management with carvedilol, entresto, spironolactone, hydralazine, and isosorbide. Loop diuretic therapy with furosemide.   And resume SGLT 2 inh.   Atrial fibrillation ([name]) Paroxysmal atrial fibrillation.  Patient remained rate control atrial fibrillation Plan to continue with carvedilol and anticoagulation with apixaban.   Acute kidney injury superimposed on chronic kidney disease ([name]) CKD stage 3b. Hyponatremia. Hypokalemia.  Patient was placed on furosemide for diuresis His volume has improved, at the time of his discharge  his renal function has a serum cr at 1,71, K is 4,2 and serum bicarbonate 21.  Na 134, Mg 1,9  Patient will continue diuretic therapy and follow up renal function and electrolytes in am.    Coronary artery disease involving native coronary artery of native heart without angina pectoris Recent cardiac catheterization with mild to moderate coronary artery disease. Elevated troponin due to heart failure decompensation.   Plan to continue medical therapy with clopidogrel and statin therapy. Continue blood pressure control. Patient is chest pain free.   Acquired hypothyroidism Continue with levothyroxine   GERD (gastroesophageal reflux disease) Continue with antiacid therapy.   Hypertensive emergency Blood pressure has improved, continue with amlodipine, hydralazine and isosorbide Carvedilol and diuresis with furosemide, spironolactone and empagliflozin.   Obesity (BMI 30-39.9)- negative sleep study in the past Calculated BMI is 31,8 consistent with obesity class 1.   Type 2 diabetes mellitus with hyperlipidemia ([name]) His glucose remained stable.  He was placed on insulin sliding scale for glucose cover and monitoring His fasting glucose at the time of discharge is 130          Consultants: cardiology  Procedures performed: none   Disposition: Home Diet recommendation:  Discharge Diet Orders (From admission, onward)     Start     Ordered   06/06/22 0000  Diet - low sodium heart healthy        06/06/22 1254           Cardiac and Carb modified diet DISCHARGE MEDICATION: Allergies as of 06/06/2022       Reactions   Fentanyl Other (See Comments)   Behavioral changes   Gabapentin Swelling   Lisinopril Other (See Comments)   unknown   Lyrica [pregabalin] Other (See Comments)   unknown   Metformin Diarrhea   Propofol Other (See Comments)   Heart rate dropped        Medication List     STOP taking these medications    escitalopram 20 MG tablet Commonly  known as: LEXAPRO       TAKE these medications    amLODipine 5 MG tablet Commonly known as: NORVASC Take 1 tablet (5 mg total) by mouth daily. Start taking on: June 07, 2022   apixaban 5 MG Tabs tablet Commonly known as: ELIQUIS Take 1 tablet (5 mg total) by mouth 2 (two) times daily.   atorvastatin 40 MG tablet Commonly known as: LIPITOR Take 1 tablet (40 mg total) by mouth daily.   carvedilol 12.5 MG tablet Commonly known as: COREG Take 1 tablet (12.5 mg total) by mouth 2 (two) times daily with a meal. What changed:  medication strength how much to take   clopidogrel 75 MG tablet Commonly known as: PLAVIX Take 1 tablet (75 mg total) by mouth daily.   dapagliflozin propanediol 10 MG Tabs tablet Commonly known as: Farxiga Take 1 tablet (10 mg total) by mouth daily before breakfast.   Entresto 97-103 MG Generic drug: sacubitril-valsartan Take 1 tablet by mouth 2 (two) times daily.   furosemide 80 MG tablet Commonly known as: LASIX Take 1 tablet (80 mg total) by mouth daily. Start taking on: June 07, 2022 What changed:  medication strength how much to take   glipiZIDE 5 MG tablet Commonly known as: GLUCOTROL Take 5 mg by mouth daily before breakfast.   hydrALAZINE 100 MG tablet Commonly known as: APRESOLINE Take 1  tablet (100 mg total) by mouth 3 (three) times daily.   isosorbide mononitrate 60 MG 24 hr tablet Commonly known as: IMDUR Take 2 tablets (120 mg total) by mouth daily.   levothyroxine 25 MCG tablet Commonly known as: SYNTHROID TAKE ONE TABLET BY MOUTH DAILY BEFORE BREAKFAST What changed: See the new instructions.   pantoprazole 40 MG tablet Commonly known as: PROTONIX Take 40 mg by mouth every other day.   spironolactone 25 MG tablet Commonly known as: ALDACTONE Take 1 tablet (25 mg total) by mouth daily. Start taking on: June 07, 2022 What changed: how much to take        [place]. Go in 14 day(s).   Specialty: Cardiology Why: Hospital follow up PLEASE bring a current medication list to appointment FREE valet parking, Entrance C, off [employer] information: [address] [id number] [place] [phone]               Discharge Exam: Filed Weights   06/03/22 0500 06/04/22 0500 06/06/22 0500  Weight: 99.1 kg 100.9 kg 100.7 kg   BP 90/60 (BP Location: Right Arm)   Pulse 69   Temp (!) 97.5 F (36.4 C) (Oral)   Resp 14   Ht 5\' 10"  (1.778 m)   Wt 100.7 kg   SpO2 100%   BMI 31.85 kg/m   Patient with no chest pain, dyspnea had improved along with edema  Neurology awake and alert ENT with mild pallor Cardiovascular with S1 and S2 present, irregularly irregular with no gallops No JVD No lower extremity edema Respiratory with no raled or wheezing Abdomen with no distention   Condition at discharge: stable  The results of significant diagnostics from this hospitalization (including imaging, microbiology, ancillary and laboratory) are listed below for reference.   Imaging Studies: DG Chest Port 1 View  Result Date: 06/03/2022 CLINICAL DATA:  Shortness of breath hypertension EXAM: PORTABLE CHEST 1 VIEW COMPARISON:  06/02/2022 FINDINGS: Hazy appearance of the bilateral lower chest attributed to layering pleural fluid with atelectasis, also seen on a chest CT from yesterday. Normal heart size. Spinal cord stimulator over the midthoracic spine. IMPRESSION: Hazy opacification of the lower chest from atelectasis and pleural fluid by CT yesterday. Electronically Signed   By: Jorje Guild M.D.   On: 06/03/2022 06:44   CT Angio Chest PE W and/or Wo Contrast  Result Date: 06/02/2022 CLINICAL DATA:  Dyspnea on exertion. Cough. Evaluate for pulmonary embolism. History of congestive heart failure, cardiac catheterization/stent, and diabetes. EXAM: CT ANGIOGRAPHY CHEST WITH  CONTRAST TECHNIQUE: Multidetector CT imaging of the chest was performed using the standard protocol during bolus administration of intravenous contrast. Multiplanar CT image reconstructions and MIPs were obtained to evaluate the vascular anatomy. RADIATION DOSE REDUCTION: This exam was performed according to the departmental dose-optimization program which includes automated exposure control, adjustment of the mA and/or kV according to patient size and/or use of iterative reconstruction technique. CONTRAST:  27mL OMNIPAQUE IOHEXOL 350 MG/ML SOLN COMPARISON:  Chest two views 06/02/2022 and 05/14/2022 FINDINGS: Cardiovascular: The main pulmonary artery is opacified up to 421 Hounsfield units. No filling defect is seen to indicate an acute pulmonary embolism. No thoracic aortic aneurysm. Dense coronary artery calcifications. High-grade atherosclerotic calcifications throughout the thoracic aorta. Mediastinum/Nodes: No axillary lymphadenopathy. There are scattered subcentimeter short axis prevascular and right paratracheal lymph nodes. 11 mm short axis precarinal lymph node, likely reactive (axial  series 5, image 130). 14 mm short axis subcarinal lymph node (axial series 5, image 155, coronal series 7, image 79), likely reactive. Mild right hilar likely lymph node soft tissue thickening. The visualized thyroid is unremarkable. Minimal diffuse esophageal wall thickening may be secondary to chronic inflammation. No focal mass is seen. Lungs/Pleura: The central airways are patent. Mild centrilobular emphysematous changes predominantly within the bilateral upper lungs. Moderate right and mild left pleural effusions with associated bilateral lower lobe compressive atelectasis. Upper Abdomen: There is moderate left and right adrenal gland thickening. There is low-density approximate 11 mm left adrenal nodule (axial series 4 images [id number]) with central low density of -13 Hounsfield units suggesting a benign lipid rich adenoma.  Minimally partially visualized likely fluid density cyst within the upper pole of the left kidney. The rest of the left kidney itself is not imaged. No follow-up imaging recommended. Musculoskeletal: Moderate multilevel degenerative disc changes of the thoracic spine. Severe C6-7 disc space narrowing. Mild retrolisthesis of C6 on C7. Moderate superior L1 and mild superior T10 vertebral body degenerative Schmorl's nodes. A spinal cord stimulator electrode is seen within the posterior aspect of the central canal at the T7 through T9 levels. Review of the MIP images confirms the above findings. IMPRESSION: 1. No acute pulmonary embolism is seen. 2. Moderate right and mild left pleural effusions with associated bilateral lower lobe compressive atelectasis. 3. Mildly enlarged precarinal lymph node, likely reactive. Aortic Atherosclerosis (ICD10-I70.0) and Emphysema (ICD10-J43.9). Electronically Signed   By: Yvonne Kendall M.D.   On: 06/02/2022 16:03   DG Chest 2 View  Result Date: 06/02/2022 CLINICAL DATA:  Shortness of breath EXAM: CHEST - 2 VIEW COMPARISON:  05/14/2022 FINDINGS: Mild bilateral interstitial thickening. No focal consolidation. No pleural effusion or pneumothorax. Heart and mediastinal contours are unremarkable. No acute osseous abnormality. Spinal stimulator noted. IMPRESSION: 1. Mild bilateral interstitial thickening which may reflect interstitial edema versus infection. Electronically Signed   By: Kathreen Devoid M.D.   On: 06/02/2022 13:44   VAS [place] LOWER EXTREMITY VENOUS (DVT)  Result Date: 05/21/2022  Lower Venous DVT Study Patient Name:  RODDERICK HOLTZER  Date of Exam:   05/21/2022 Medical Rec #: [id number]         Accession #:    [id number] Date of Birth: May 31, 1941         Patient Gender: M Patient Age:   9 years Exam Location:  [facility] Procedure:      VAS [place] LOWER EXTREMITY VENOUS (DVT) Referring Phys: Wynetta Fines  --------------------------------------------------------------------------------  Indications: Pulmonary embolism.  Risk Factors: None identified. Limitations: Poor ultrasound/tissue interface. Comparison Study: No prior studies. Performing Technologist: Oliver Hum RVT  Examination Guidelines: A complete evaluation includes B-mode imaging, spectral Doppler, color Doppler, and power Doppler as needed of all accessible portions of each vessel. Bilateral testing is considered an integral part of a complete examination. Limited examinations for reoccurring indications may be performed as noted. The reflux portion of the exam is performed with the patient in reverse Trendelenburg.  +---------+---------------+---------+-----------+----------+--------------+ RIGHT    CompressibilityPhasicitySpontaneityPropertiesThrombus Aging +---------+---------------+---------+-----------+----------+--------------+ CFV      Full           Yes      Yes                                 +---------+---------------+---------+-----------+----------+--------------+ SFJ      Full                                                        +---------+---------------+---------+-----------+----------+--------------+  FV Prox  Full                                                        +---------+---------------+---------+-----------+----------+--------------+ FV Mid   Full                                                        +---------+---------------+---------+-----------+----------+--------------+ FV DistalFull                                                        +---------+---------------+---------+-----------+----------+--------------+ PFV      Full                                                        +---------+---------------+---------+-----------+----------+--------------+ POP      Full           Yes      Yes                                  +---------+---------------+---------+-----------+----------+--------------+ PTV      Full                                                        +---------+---------------+---------+-----------+----------+--------------+ PERO     Full                                                        +---------+---------------+---------+-----------+----------+--------------+   +---------+---------------+---------+-----------+----------+--------------+ LEFT     CompressibilityPhasicitySpontaneityPropertiesThrombus Aging +---------+---------------+---------+-----------+----------+--------------+ CFV      Full           Yes      Yes                                 +---------+---------------+---------+-----------+----------+--------------+ SFJ      Full                                                        +---------+---------------+---------+-----------+----------+--------------+ FV Prox  Full                                                        +---------+---------------+---------+-----------+----------+--------------+  FV Mid   Full                                                        +---------+---------------+---------+-----------+----------+--------------+ FV DistalFull                                                        +---------+---------------+---------+-----------+----------+--------------+ PFV      Full                                                        +---------+---------------+---------+-----------+----------+--------------+ POP      Full           Yes      Yes                                 +---------+---------------+---------+-----------+----------+--------------+ PTV      Full                                                        +---------+---------------+---------+-----------+----------+--------------+ PERO     Full                                                         +---------+---------------+---------+-----------+----------+--------------+     Summary: RIGHT: - There is no evidence of deep vein thrombosis in the lower extremity.  - No cystic structure found in the popliteal fossa.  LEFT: - There is no evidence of deep vein thrombosis in the lower extremity.  - No cystic structure found in the popliteal fossa.  *See table(s) above for measurements and observations. Electronically signed by Harold Barban MD on 05/21/2022 at 10:32:32 PM.    Final    CARDIAC CATHETERIZATION  Result Date: 05/20/2022   Prox LAD to Mid LAD lesion is 40% stenosed.   Prox Cx to Mid Cx lesion is 30% stenosed.   Prox RCA to Mid RCA lesion is 60% stenosed.   1st Diag lesion is 60% stenosed.   Dist LAD lesion is 40% stenosed.   Non-stenotic Ost RCA to Prox RCA lesion was previously treated. 1.  Mild to moderate obstructive coronary artery disease; compared with the previous study the angiographic appearance looks relatively improved.  Overall the burden of coronary artery disease does not explain the patient's severe cardiomyopathy.  2.  Mean RA pressure of 5 mmHg and mean wedge pressure of 5 mmHg with normal cardiac output of 5.9 L/min and cardiac index of 2.7 L/min/m. Recommendation: Goal-directed medical therapy.   DG Chest 2 View  Result Date: 05/14/2022 CLINICAL DATA:  Shortness of breath EXAM: CHEST - 2  VIEW COMPARISON:  Previous studies including the examination of 05/13/2022 FINDINGS: Transverse diameter of heart is slightly increased. Central pulmonary vessels are prominent without signs of alveolar pulmonary edema. There is increased density in both lower lung fields. There is slight improvement in this finding. There are no new focal infiltrates. There is no pneumothorax. There is blunting of both posterior costophrenic angles. Pain control lead is seen in thoracic spinal canal. IMPRESSION: Increased density in both lower lung fields may be due to small bilateral effusions and possibly  underlying atelectasis/pneumonia. There are no signs of alveolar pulmonary edema or new focal infiltrates. Electronically Signed   By: Elmer Picker M.D.   On: 05/14/2022 15:23   ECHOCARDIOGRAM LIMITED  Result Date: 05/14/2022    ECHOCARDIOGRAM LIMITED REPORT   Patient Name:   CARMIE RISENHOOVER Date of Exam: 05/14/2022 Medical Rec #:  [id number]        Height:       70.0 in Accession #:    [id number]       Weight:       224.0 lb Date of Birth:  07/08/1941        BSA:          2.190 m Patient Age:    54 years         BP:           161/99 mmHg Patient Gender: M                HR:           102 bpm. Exam Location:  Inpatient Procedure: Limited Echo Indications:    CHF Acute Systolic [id number]  History:        Patient has prior history of Echocardiogram examinations, most                 recent 11/25/2021. CHF, CAD and Angina, PAD,                 Arrythmias:Bradycardia and Atrial Fibrillation; Risk                 Factors:Hypertension, Diabetes and Dyslipidemia. Elevated                 Troponin.  Sonographer:    Ronny Flurry Referring Phys: [id number] OLADAPO ADEFESO IMPRESSIONS  1. Left ventricular ejection fraction, by estimation, is 25 to 30%. The left ventricle has severely decreased function. The left ventricle demonstrates regional wall motion abnormalities (see scoring diagram/findings for description).  2. Right ventricular systolic function is normal. The right ventricular size is normal. Tricuspid regurgitation signal is inadequate for assessing PA pressure.  3. Trivial mitral valve regurgitation. No evidence of mitral stenosis.  4. The aortic valve is tricuspid. There is mild calcification of the aortic valve. There is mild thickening of the aortic valve. Aortic valve sclerosis/calcification is present, without any evidence of aortic stenosis.  5. The inferior vena cava is dilated in size with <50% respiratory variability, suggesting right atrial pressure of 15 mmHg. Comparison(s): Prior images reviewed  side by side. The left ventricular function is significantly worse. The left ventricular wall motion abnormalities are significantly worse. FINDINGS  Left Ventricle: Left ventricular ejection fraction, by estimation, is 25 to 30%. The left ventricle has severely decreased function. The left ventricle demonstrates regional wall motion abnormalities. Severe hypokinesis of the left ventricular, basal-mid inferoseptal wall, inferior wall and anterolateral wall. Mild hypokinesis of the left ventricular, entire anteroseptal wall, anterior wall and apical segment. The  left ventricular internal cavity size was normal in size. There is no left ventricular hypertrophy. Right Ventricle: The right ventricular size is normal. No increase in right ventricular wall thickness. Right ventricular systolic function is normal. Tricuspid regurgitation signal is inadequate for assessing PA pressure. Right Atrium: Right atrial size was normal in size. Pericardium: Trivial pericardial effusion is present. Mitral Valve: Mild to moderate mitral annular calcification. Trivial mitral valve regurgitation. No evidence of mitral valve stenosis. Tricuspid Valve: The tricuspid valve is normal in structure. Tricuspid valve regurgitation is not demonstrated. Aortic Valve: The aortic valve is tricuspid. There is mild calcification of the aortic valve. There is mild thickening of the aortic valve. Aortic valve sclerosis/calcification is present, without any evidence of aortic stenosis. Pulmonic Valve: The pulmonic valve was normal in structure. Pulmonic valve regurgitation is not visualized. Aorta: The aortic root is normal in size and structure. Venous: The inferior vena cava is dilated in size with less than 50% respiratory variability, suggesting right atrial pressure of 15 mmHg. IAS/Shunts: No atrial level shunt detected by color flow Doppler. Sanda Klein MD Electronically signed by Sanda Klein MD Signature Date/Time: 05/14/2022/12:52:08 PM     Final    DG Chest Port 1 View  Result Date: 05/13/2022 CLINICAL DATA:  Shortness of breath, atrial fibrillation with rapid ventricular response EXAM: PORTABLE CHEST 1 VIEW COMPARISON:  Portable exam 1404 hours compared to 11/23/2021 FINDINGS: Enlargement of cardiac silhouette. Mediastinal contours and pulmonary vascularity normal. Atherosclerotic calcification aorta. Bibasilar consolidation favor multifocal pneumonia over are locked assist or edema. Upper lungs clear. No pleural effusion or pneumothorax. Intraspinal stimulator noted. IMPRESSION: Enlargement of cardiac silhouette with bibasilar infiltrates favoring multifocal pneumonia as above. Aortic Atherosclerosis (ICD10-I70.0). Electronically Signed   By: Lavonia Dana M.D.   On: 05/13/2022 14:17    Microbiology: Results for orders placed or performed during the hospital encounter of 06/02/22  Resp Panel by RT-PCR (Flu A&B, Covid) Anterior Nasal Swab     Status: None   Collection Time: 06/02/22  2:00 PM   Specimen: Anterior Nasal Swab  Result Value Ref Range Status   SARS Coronavirus 2 by RT PCR NEGATIVE NEGATIVE Final    Comment: (NOTE) SARS-CoV-2 target nucleic acids are NOT DETECTED.  The SARS-CoV-2 RNA is generally detectable in upper respiratory specimens during the acute phase of infection. The lowest concentration of SARS-CoV-2 viral copies this assay can detect is 138 copies/mL. A negative result does not preclude SARS-Cov-2 infection and should not be used as the sole basis for treatment or other patient management decisions. A negative result may occur with  improper specimen collection/handling, submission of specimen other than nasopharyngeal swab, presence of viral mutation(s) within the areas targeted by this assay, and inadequate number of viral copies(<138 copies/mL). A negative result must be combined with clinical observations, patient history, and epidemiological information. The expected result is Negative.  Fact  Sheet for Patients:  EntrepreneurPulse.com.au  Fact Sheet for Healthcare Providers:  IncredibleEmployment.be  This test is no t yet approved or cleared by the [place] FDA and  has been authorized for detection and/or diagnosis of SARS-CoV-2 by FDA under an Emergency Use Authorization (EUA). This EUA will remain  in effect (meaning this test can be used) for the duration of the COVID-19 declaration under Section 564(b)(1) of the Act, 21 U.S.C.section 360bbb-3(b)(1), unless the authorization is terminated  or revoked sooner.       Influenza A by PCR NEGATIVE NEGATIVE Final   Influenza B by PCR NEGATIVE NEGATIVE  Final    Comment: (NOTE) The Xpert Xpress SARS-CoV-2/FLU/RSV plus assay is intended as an aid in the diagnosis of influenza from Nasopharyngeal swab specimens and should not be used as a sole basis for treatment. Nasal washings and aspirates are unacceptable for Xpert Xpress SARS-CoV-2/FLU/RSV testing.  Fact Sheet for Patients: EntrepreneurPulse.com.au  Fact Sheet for Healthcare Providers: IncredibleEmployment.be  This test is not yet approved or cleared by the [place] FDA and has been authorized for detection and/or diagnosis of SARS-CoV-2 by FDA under an Emergency Use Authorization (EUA). This EUA will remain in effect (meaning this test can be used) for the duration of the COVID-19 declaration under Section 564(b)(1) of the Act, 21 U.S.C. section 360bbb-3(b)(1), unless the authorization is terminated or revoked.  Performed at [facility], [address] [place], [place] [address]     Labs: CBC: Recent Labs  Lab 06/02/22 1313 06/03/22 0254 06/04/22 0601 06/05/22 0254 06/06/22 0206  WBC 11.4* 9.9 9.3 8.1 7.9  NEUTROABS  --   --  6.6 5.8 5.4  HGB 14.7 13.4 12.9* 13.6 13.4  HCT 43.6 40.6 38.1* 40.2 39.0  MCV 90.1 91.0 90.9 89.7 90.3  PLT 293 247 200 254  [id number]   Basic Metabolic Panel: Recent Labs  Lab 06/02/22 1313 06/03/22 0254 06/03/22 0606 06/04/22 0601 06/05/22 0254 06/06/22 0206  NA 136 138  --  136 135 134*  K 3.2* 3.3*  --  3.4* 3.9 4.2  CL 99 102  --  102 101 102  CO2 24 26  --  22 25 21*  GLUCOSE 163* 145*  --  127* 131* 130*  BUN 14 12  --  16 21 26*  CREATININE 1.44* 1.45*  --  1.88* 1.62* 1.71*  CALCIUM 9.5 8.8*  --  8.5* 8.7* 8.5*  MG  --  1.9 2.0 1.9 1.9 1.9   Liver Function Tests: No results for input(s): "AST", "ALT", "ALKPHOS", "BILITOT", "PROT", "ALBUMIN" in the last 168 hours. CBG: Recent Labs  Lab 06/05/22 1141 06/05/22 1638 06/05/22 2128 06/06/22 0756 06/06/22 1200  GLUCAP 186* 145* 194* 144* 139*    Discharge time spent: greater than 30 minutes.  Signed: Tawni Millers, MD [facility] 06/06/2022

## 2022-06-06 NOTE — Assessment & Plan Note (Signed)
Calculated BMI is 31,8 consistent with obesity class 1.

## 2022-06-06 NOTE — Assessment & Plan Note (Signed)
Blood pressure has improved, continue with amlodipine, hydralazine and isosorbide Carvedilol and diuresis with furosemide, spironolactone and empagliflozin.

## 2022-06-06 NOTE — Assessment & Plan Note (Addendum)
Echocardiogram with reduced LV systolic function with EF 25 to 30%. Severe hypokinesis of the left ventricular basal mid inferoseptal wall, inferior wall and antero lateral wall. Mild hypokinesis of the left ventricular, entire antero septal wasll, anterior wall and apical segment.  RV systolic function preserved, no significant valvular disease. Trivial pericardial effusion.   Patient was placed on IV furosemide for diuresis, negative fluid balance was achieved, -4,360 ml, with significant improvement in his symptoms.   Patient will continue heart failure management with carvedilol, entresto, spironolactone, hydralazine, and isosorbide. Loop diuretic therapy with furosemide.   And resume SGLT 2 inh.

## 2022-06-06 NOTE — Care Management Important Message (Signed)
Important Message  Patient Details  Name: Albert Powell MRN: [id number] Date of Birth: [birth year]   Medicare Important Message Given:  Yes     Hannah Beat 06/06/2022, 9:39 AM

## 2022-06-06 NOTE — Assessment & Plan Note (Signed)
CKD stage 3b. Hyponatremia. Hypokalemia.   Patient was placed on furosemide for diuresis His volume has improved, at the time of his discharge his renal function has a serum cr at 1,71, K is 4,2 and serum bicarbonate 21.  Na 134, Mg 1,9  Patient will continue diuretic therapy and follow up renal function and electrolytes in am.

## 2022-06-06 NOTE — Assessment & Plan Note (Signed)
His glucose remained stable.  He was placed on insulin sliding scale for glucose cover and monitoring His fasting glucose at the time of discharge is 130

## 2022-06-06 NOTE — Hospital Course (Signed)
Albert Powell was admitted to the hospital with the working diagnosis of decompensated heart failure.   81 yo male with the past medical history of atrial fibrillation, heart failure, COPD, GERD, T2DM, hypertension, dyslipidemia, and obesity who presented with dyspnea. Recent hospitalization for heart failure exacerbation 08/29 to 05/21/22, at the time of his discharge he had low filling pressures per cardiac catheterization. He reported recurrent progressive dyspnea and orthopnea a few days prior to coming back to the ED. At home had increase water intake. On his initial physical examination his blood pressure was 183/113 HR 74, RR 17 and 02 saturation 93%, lungs with rales bilaterally, heart with S1 and S2 present irregular, abdomen with no distention, positive lower extremity edema.   Na 136, K 3,2 CL 99 bicarbonate 24, glucose 163 bun 14 cr 1,44 BNP 3,560 High sensitive troponin <2. 37, 35, 42 Wbc 11,4 hgb 14.3 plt 293  Sars COVID 19 negative   Chest radiograph with no cardiomegaly, bilateral hilar vascular congestion with cephalization of the vasculature.  CT chest with bilateral ground glass opacities, with bilateral pleural effusions more right than left with compression atelectasis.   EKG 119 bpm, left axis deviation, normal qtc, atrial fibrillation rhythm with no significant ST segment or T wave changes.   Patient was placed on nitroglycerin drip and diuresis with IV furosemide with improvement in his symptoms.  Oral blood pressure medications were resume and IV nitroglycerin was weaned off with good toleration.

## 2022-06-18 ENCOUNTER — Telehealth (HOSPITAL_COMMUNITY): Payer: Self-pay

## 2022-06-18 ENCOUNTER — Inpatient Hospital Stay (HOSPITAL_COMMUNITY): Admit: 2022-06-18 | Payer: Medicare Other

## 2022-06-18 NOTE — Telephone Encounter (Signed)
Called to confirm Heart & Vascular Transitions of Care appointment at 2pm today. Patient reminded to bring all medications and pill box organizer with them. Confirmed patient has transportation. Gave directions, instructed to utilize valet parking.  Confirmed appointment prior to ending call.   Ishanvi Mcquitty, MSN, RN Heart Failure Nurse Navigator   

## 2022-06-23 ENCOUNTER — Encounter (HOSPITAL_COMMUNITY): Payer: Self-pay

## 2022-06-23 ENCOUNTER — Inpatient Hospital Stay (HOSPITAL_COMMUNITY)
Admission: EM | Admit: 2022-06-23 | Discharge: 2022-07-03 | DRG: 264 | Disposition: A | Discharge status: Home Health | Payer: Medicare Other | Attending: Internal Medicine | Admitting: Internal Medicine

## 2022-06-23 ENCOUNTER — Other Ambulatory Visit: Payer: Self-pay

## 2022-06-23 ENCOUNTER — Emergency Department (HOSPITAL_COMMUNITY): Payer: Medicare Other

## 2022-06-23 DIAGNOSIS — Z955 Presence of coronary angioplasty implant and graft: Secondary | ICD-10-CM

## 2022-06-23 DIAGNOSIS — I2729 Other secondary pulmonary hypertension: Secondary | ICD-10-CM | POA: Diagnosis present

## 2022-06-23 DIAGNOSIS — I480 Paroxysmal atrial fibrillation: Secondary | ICD-10-CM | POA: Diagnosis not present

## 2022-06-23 DIAGNOSIS — E1169 Type 2 diabetes mellitus with other specified complication: Secondary | ICD-10-CM | POA: Diagnosis present

## 2022-06-23 DIAGNOSIS — E1122 Type 2 diabetes mellitus with diabetic chronic kidney disease: Secondary | ICD-10-CM | POA: Diagnosis present

## 2022-06-23 DIAGNOSIS — N179 Acute kidney failure, unspecified: Secondary | ICD-10-CM | POA: Diagnosis not present

## 2022-06-23 DIAGNOSIS — Z87891 Personal history of nicotine dependence: Secondary | ICD-10-CM | POA: Diagnosis not present

## 2022-06-23 DIAGNOSIS — E039 Hypothyroidism, unspecified: Secondary | ICD-10-CM | POA: Diagnosis not present

## 2022-06-23 DIAGNOSIS — Z66 Do not resuscitate: Secondary | ICD-10-CM | POA: Diagnosis not present

## 2022-06-23 DIAGNOSIS — I701 Atherosclerosis of renal artery: Secondary | ICD-10-CM | POA: Diagnosis present

## 2022-06-23 DIAGNOSIS — Z888 Allergy status to other drugs, medicaments and biological substances status: Secondary | ICD-10-CM

## 2022-06-23 DIAGNOSIS — I169 Hypertensive crisis, unspecified: Secondary | ICD-10-CM | POA: Diagnosis not present

## 2022-06-23 DIAGNOSIS — Z825 Family history of asthma and other chronic lower respiratory diseases: Secondary | ICD-10-CM

## 2022-06-23 DIAGNOSIS — R7989 Other specified abnormal findings of blood chemistry: Secondary | ICD-10-CM | POA: Insufficient documentation

## 2022-06-23 DIAGNOSIS — E1151 Type 2 diabetes mellitus with diabetic peripheral angiopathy without gangrene: Secondary | ICD-10-CM | POA: Diagnosis present

## 2022-06-23 DIAGNOSIS — Z823 Family history of stroke: Secondary | ICD-10-CM

## 2022-06-23 DIAGNOSIS — N281 Cyst of kidney, acquired: Secondary | ICD-10-CM | POA: Diagnosis not present

## 2022-06-23 DIAGNOSIS — N1832 Chronic kidney disease, stage 3b: Secondary | ICD-10-CM | POA: Diagnosis present

## 2022-06-23 DIAGNOSIS — Z7984 Long term (current) use of oral hypoglycemic drugs: Secondary | ICD-10-CM

## 2022-06-23 DIAGNOSIS — R0602 Shortness of breath: Secondary | ICD-10-CM | POA: Diagnosis not present

## 2022-06-23 DIAGNOSIS — I5043 Acute on chronic combined systolic (congestive) and diastolic (congestive) heart failure: Secondary | ICD-10-CM | POA: Diagnosis not present

## 2022-06-23 DIAGNOSIS — E669 Obesity, unspecified: Secondary | ICD-10-CM | POA: Diagnosis not present

## 2022-06-23 DIAGNOSIS — I2510 Atherosclerotic heart disease of native coronary artery without angina pectoris: Secondary | ICD-10-CM | POA: Diagnosis present

## 2022-06-23 DIAGNOSIS — R0902 Hypoxemia: Secondary | ICD-10-CM | POA: Diagnosis not present

## 2022-06-23 DIAGNOSIS — I509 Heart failure, unspecified: Secondary | ICD-10-CM

## 2022-06-23 DIAGNOSIS — J9601 Acute respiratory failure with hypoxia: Secondary | ICD-10-CM | POA: Diagnosis not present

## 2022-06-23 DIAGNOSIS — E876 Hypokalemia: Secondary | ICD-10-CM | POA: Diagnosis not present

## 2022-06-23 DIAGNOSIS — I130 Hypertensive heart and chronic kidney disease with heart failure and stage 1 through stage 4 chronic kidney disease, or unspecified chronic kidney disease: Principal | ICD-10-CM | POA: Diagnosis present

## 2022-06-23 DIAGNOSIS — N183 Chronic kidney disease, stage 3 unspecified: Secondary | ICD-10-CM | POA: Diagnosis not present

## 2022-06-23 DIAGNOSIS — Z8349 Family history of other endocrine, nutritional and metabolic diseases: Secondary | ICD-10-CM

## 2022-06-23 DIAGNOSIS — Z8249 Family history of ischemic heart disease and other diseases of the circulatory system: Secondary | ICD-10-CM

## 2022-06-23 DIAGNOSIS — I4819 Other persistent atrial fibrillation: Secondary | ICD-10-CM | POA: Diagnosis present

## 2022-06-23 DIAGNOSIS — I161 Hypertensive emergency: Secondary | ICD-10-CM | POA: Diagnosis not present

## 2022-06-23 DIAGNOSIS — K219 Gastro-esophageal reflux disease without esophagitis: Secondary | ICD-10-CM | POA: Diagnosis present

## 2022-06-23 DIAGNOSIS — I959 Hypotension, unspecified: Secondary | ICD-10-CM | POA: Diagnosis not present

## 2022-06-23 DIAGNOSIS — Z833 Family history of diabetes mellitus: Secondary | ICD-10-CM

## 2022-06-23 DIAGNOSIS — E785 Hyperlipidemia, unspecified: Secondary | ICD-10-CM | POA: Diagnosis not present

## 2022-06-23 DIAGNOSIS — N261 Atrophy of kidney (terminal): Secondary | ICD-10-CM | POA: Diagnosis present

## 2022-06-23 DIAGNOSIS — I5023 Acute on chronic systolic (congestive) heart failure: Secondary | ICD-10-CM | POA: Diagnosis present

## 2022-06-23 DIAGNOSIS — I160 Hypertensive urgency: Secondary | ICD-10-CM | POA: Diagnosis not present

## 2022-06-23 DIAGNOSIS — I429 Cardiomyopathy, unspecified: Secondary | ICD-10-CM | POA: Diagnosis not present

## 2022-06-23 DIAGNOSIS — Z79899 Other long term (current) drug therapy: Secondary | ICD-10-CM | POA: Diagnosis not present

## 2022-06-23 DIAGNOSIS — J449 Chronic obstructive pulmonary disease, unspecified: Secondary | ICD-10-CM | POA: Diagnosis not present

## 2022-06-23 DIAGNOSIS — Z884 Allergy status to anesthetic agent status: Secondary | ICD-10-CM

## 2022-06-23 DIAGNOSIS — N189 Chronic kidney disease, unspecified: Secondary | ICD-10-CM | POA: Diagnosis not present

## 2022-06-23 DIAGNOSIS — Z7902 Long term (current) use of antithrombotics/antiplatelets: Secondary | ICD-10-CM

## 2022-06-23 DIAGNOSIS — Z7901 Long term (current) use of anticoagulants: Secondary | ICD-10-CM

## 2022-06-23 DIAGNOSIS — J90 Pleural effusion, not elsewhere classified: Secondary | ICD-10-CM | POA: Diagnosis not present

## 2022-06-23 DIAGNOSIS — I10 Essential (primary) hypertension: Secondary | ICD-10-CM | POA: Diagnosis not present

## 2022-06-23 DIAGNOSIS — R0600 Dyspnea, unspecified: Secondary | ICD-10-CM | POA: Diagnosis not present

## 2022-06-23 DIAGNOSIS — Z885 Allergy status to narcotic agent status: Secondary | ICD-10-CM

## 2022-06-23 DIAGNOSIS — I110 Hypertensive heart disease with heart failure: Secondary | ICD-10-CM | POA: Diagnosis not present

## 2022-06-23 DIAGNOSIS — Z6831 Body mass index (BMI) 31.0-31.9, adult: Secondary | ICD-10-CM

## 2022-06-23 DIAGNOSIS — R000 Tachycardia, unspecified: Secondary | ICD-10-CM | POA: Diagnosis not present

## 2022-06-23 DIAGNOSIS — I4891 Unspecified atrial fibrillation: Secondary | ICD-10-CM | POA: Diagnosis not present

## 2022-06-23 DIAGNOSIS — Z79890 Hormone replacement therapy (postmenopausal): Secondary | ICD-10-CM

## 2022-06-23 HISTORY — DX: Peripheral vascular disease, unspecified: I73.9

## 2022-06-23 HISTORY — DX: Chronic kidney disease, stage 3 unspecified: N18.30

## 2022-06-23 HISTORY — DX: Obesity, unspecified: E66.9

## 2022-06-23 HISTORY — DX: Hypothyroidism, unspecified: E03.9

## 2022-06-23 LAB — CBC WITH DIFFERENTIAL/PLATELET
Abs Immature Granulocytes: 0.06 10*3/uL (ref 0.00–0.07)
Basophils Absolute: 0.1 10*3/uL (ref 0.0–0.1)
Basophils Relative: 1 %
Eosinophils Absolute: 0.2 10*3/uL (ref 0.0–0.5)
Eosinophils Relative: 2 %
HCT: 42.4 % (ref 39.0–52.0)
Hemoglobin: 14 g/dL (ref 13.0–17.0)
Immature Granulocytes: 1 %
Lymphocytes Relative: 11 %
Lymphs Abs: 1.1 10*3/uL (ref 0.7–4.0)
MCH: 30.2 pg (ref 26.0–34.0)
MCHC: 33 g/dL (ref 30.0–36.0)
MCV: 91.6 fL (ref 80.0–100.0)
Monocytes Absolute: 0.7 10*3/uL (ref 0.1–1.0)
Monocytes Relative: 8 %
Neutro Abs: 7.3 10*3/uL (ref 1.7–7.7)
Neutrophils Relative %: 77 %
Platelets: 307 10*3/uL (ref 150–400)
RBC: 4.63 MIL/uL (ref 4.22–5.81)
RDW: 13.3 % (ref 11.5–15.5)
WBC: 9.5 10*3/uL (ref 4.0–10.5)
nRBC: 0 % (ref 0.0–0.2)

## 2022-06-23 LAB — TROPONIN I (HIGH SENSITIVITY)
Troponin I (High Sensitivity): 64 ng/L — ABNORMAL HIGH (ref <18)
Troponin I (High Sensitivity): 66 ng/L — ABNORMAL HIGH (ref <18)

## 2022-06-23 LAB — MAGNESIUM: Magnesium: 2.1 mg/dL (ref 1.7–2.4)

## 2022-06-23 LAB — COMPREHENSIVE METABOLIC PANEL
ALT: 34 U/L (ref 0–44)
AST: 23 U/L (ref 15–41)
Albumin: 3.3 g/dL — ABNORMAL LOW (ref 3.5–5.0)
Alkaline Phosphatase: 73 U/L (ref 38–126)
Anion gap: 13 (ref 5–15)
BUN: 15 mg/dL (ref 8–23)
CO2: 26 mmol/L (ref 22–32)
Calcium: 8.8 mg/dL — ABNORMAL LOW (ref 8.9–10.3)
Chloride: 98 mmol/L (ref 98–111)
Creatinine, Ser: 1.57 mg/dL — ABNORMAL HIGH (ref 0.61–1.24)
GFR, Estimated: 44 mL/min — ABNORMAL LOW (ref >60)
Glucose, Bld: 182 mg/dL — ABNORMAL HIGH (ref 70–99)
Potassium: 3 mmol/L — ABNORMAL LOW (ref 3.5–5.1)
Sodium: 137 mmol/L (ref 135–145)
Total Bilirubin: 0.7 mg/dL (ref 0.3–1.2)
Total Protein: 6.5 g/dL (ref 6.5–8.1)

## 2022-06-23 LAB — BRAIN NATRIURETIC PEPTIDE: B Natriuretic Peptide: 3165.5 pg/mL — ABNORMAL HIGH (ref 0.0–100.0)

## 2022-06-23 MED ORDER — ATORVASTATIN CALCIUM 40 MG PO TABS
40.0000 mg | ORAL_TABLET | Freq: Every day | ORAL | Status: DC
  Administered 2022-06-24 – 2022-07-03 (×10): 40 mg via ORAL
  Filled 2022-06-23 (×10): qty 1

## 2022-06-23 MED ORDER — NITROGLYCERIN IN D5W 200-5 MCG/ML-% IV SOLN
0.0000 ug/min | INTRAVENOUS | Status: DC
  Administered 2022-06-23: 50 ug/min via INTRAVENOUS
  Filled 2022-06-23: qty 250

## 2022-06-23 MED ORDER — NITROGLYCERIN 2 % TD OINT
1.0000 [in_us] | TOPICAL_OINTMENT | Freq: Once | TRANSDERMAL | Status: AC
  Administered 2022-06-23: 1 [in_us] via TOPICAL
  Filled 2022-06-23: qty 1

## 2022-06-23 MED ORDER — NITROGLYCERIN IN D5W 200-5 MCG/ML-% IV SOLN
0.0000 ug/min | INTRAVENOUS | Status: DC
  Administered 2022-06-23: 50 ug/min via INTRAVENOUS

## 2022-06-23 MED ORDER — FUROSEMIDE 10 MG/ML IJ SOLN
80.0000 mg | Freq: Two times a day (BID) | INTRAMUSCULAR | Status: DC
  Administered 2022-06-24: 80 mg via INTRAVENOUS
  Filled 2022-06-23: qty 8

## 2022-06-23 MED ORDER — ISOSORBIDE MONONITRATE ER 30 MG PO TB24: 120.0000 mg | ORAL_TABLET | Freq: Every day | ORAL | Status: DC

## 2022-06-23 MED ORDER — ACETAMINOPHEN 650 MG RE SUPP: 650.0000 mg | Freq: Four times a day (QID) | RECTAL | Status: DC | PRN

## 2022-06-23 MED ORDER — POTASSIUM CHLORIDE 10 MEQ/100ML IV SOLN
10.0000 meq | Freq: Once | INTRAVENOUS | Status: AC
  Administered 2022-06-23: 10 meq via INTRAVENOUS
  Filled 2022-06-23: qty 100

## 2022-06-23 MED ORDER — PANTOPRAZOLE SODIUM 40 MG PO TBEC
40.0000 mg | DELAYED_RELEASE_TABLET | ORAL | Status: DC
  Administered 2022-06-24 – 2022-07-02 (×5): 40 mg via ORAL
  Filled 2022-06-23 (×7): qty 1

## 2022-06-23 MED ORDER — SACUBITRIL-VALSARTAN 97-103 MG PO TABS
1.0000 | ORAL_TABLET | Freq: Two times a day (BID) | ORAL | Status: DC
  Administered 2022-06-23 – 2022-06-24 (×2): 1 via ORAL
  Filled 2022-06-23 (×2): qty 1

## 2022-06-23 MED ORDER — INSULIN ASPART 100 UNIT/ML IJ SOLN
0.0000 [IU] | Freq: Three times a day (TID) | INTRAMUSCULAR | Status: DC
  Administered 2022-06-24: 1 [IU] via SUBCUTANEOUS
  Administered 2022-06-24: 2 [IU] via SUBCUTANEOUS
  Administered 2022-06-24: 1 [IU] via SUBCUTANEOUS
  Administered 2022-06-25 – 2022-06-26 (×2): 2 [IU] via SUBCUTANEOUS
  Administered 2022-06-27: 1 [IU] via SUBCUTANEOUS
  Administered 2022-06-27: 2 [IU] via SUBCUTANEOUS
  Administered 2022-06-27 – 2022-06-28 (×3): 1 [IU] via SUBCUTANEOUS
  Administered 2022-06-29: 2 [IU] via SUBCUTANEOUS
  Administered 2022-06-29 – 2022-06-30 (×3): 1 [IU] via SUBCUTANEOUS
  Administered 2022-06-30: 3 [IU] via SUBCUTANEOUS
  Administered 2022-07-01: 1 [IU] via SUBCUTANEOUS

## 2022-06-23 MED ORDER — AMLODIPINE BESYLATE 5 MG PO TABS
5.0000 mg | ORAL_TABLET | Freq: Every day | ORAL | Status: DC
  Administered 2022-06-23 – 2022-06-24 (×2): 5 mg via ORAL
  Filled 2022-06-23 (×2): qty 1

## 2022-06-23 MED ORDER — POTASSIUM CHLORIDE CRYS ER 20 MEQ PO TBCR
40.0000 meq | EXTENDED_RELEASE_TABLET | Freq: Once | ORAL | Status: AC
  Administered 2022-06-23: 40 meq via ORAL
  Filled 2022-06-23: qty 2

## 2022-06-23 MED ORDER — POTASSIUM CHLORIDE CRYS ER 20 MEQ PO TBCR
20.0000 meq | EXTENDED_RELEASE_TABLET | Freq: Every day | ORAL | Status: DC
  Administered 2022-06-24: 20 meq via ORAL
  Filled 2022-06-23: qty 1

## 2022-06-23 MED ORDER — INSULIN ASPART 100 UNIT/ML IJ SOLN
0.0000 [IU] | Freq: Every day | INTRAMUSCULAR | Status: DC
  Administered 2022-06-29 – 2022-07-01 (×2): 2 [IU] via SUBCUTANEOUS

## 2022-06-23 MED ORDER — APIXABAN 5 MG PO TABS
5.0000 mg | ORAL_TABLET | Freq: Two times a day (BID) | ORAL | Status: DC
  Administered 2022-06-23 – 2022-06-24 (×2): 5 mg via ORAL
  Filled 2022-06-23 (×2): qty 1

## 2022-06-23 MED ORDER — SENNOSIDES-DOCUSATE SODIUM 8.6-50 MG PO TABS: 1.0000 | ORAL_TABLET | Freq: Every evening | ORAL | Status: DC | PRN

## 2022-06-23 MED ORDER — ACETAMINOPHEN 325 MG PO TABS
650.0000 mg | ORAL_TABLET | Freq: Four times a day (QID) | ORAL | Status: DC | PRN
  Filled 2022-06-23: qty 2

## 2022-06-23 MED ORDER — SODIUM CHLORIDE 0.9% FLUSH
3.0000 mL | Freq: Two times a day (BID) | INTRAVENOUS | Status: DC
  Administered 2022-06-24 – 2022-06-30 (×6): 3 mL via INTRAVENOUS

## 2022-06-23 MED ORDER — ONDANSETRON HCL 4 MG PO TABS: 4.0000 mg | ORAL_TABLET | Freq: Four times a day (QID) | ORAL | Status: DC | PRN

## 2022-06-23 MED ORDER — ONDANSETRON HCL 4 MG/2ML IJ SOLN: 4.0000 mg | Freq: Four times a day (QID) | INTRAMUSCULAR | Status: DC | PRN

## 2022-06-23 MED ORDER — HYDRALAZINE HCL 50 MG PO TABS
100.0000 mg | ORAL_TABLET | Freq: Three times a day (TID) | ORAL | Status: DC
  Administered 2022-06-23 – 2022-06-24 (×2): 100 mg via ORAL
  Filled 2022-06-23 (×2): qty 2

## 2022-06-23 MED ORDER — DAPAGLIFLOZIN PROPANEDIOL 10 MG PO TABS
10.0000 mg | ORAL_TABLET | Freq: Every day | ORAL | Status: DC
  Administered 2022-06-24: 10 mg via ORAL
  Filled 2022-06-23: qty 1

## 2022-06-23 MED ORDER — FUROSEMIDE 10 MG/ML IJ SOLN
80.0000 mg | Freq: Once | INTRAMUSCULAR | Status: AC
  Administered 2022-06-23: 80 mg via INTRAVENOUS
  Filled 2022-06-23: qty 8

## 2022-06-23 MED ORDER — CARVEDILOL 12.5 MG PO TABS
12.5000 mg | ORAL_TABLET | Freq: Two times a day (BID) | ORAL | Status: DC
  Administered 2022-06-24 – 2022-06-30 (×13): 12.5 mg via ORAL
  Filled 2022-06-23 (×13): qty 1

## 2022-06-23 MED ORDER — LEVOTHYROXINE SODIUM 25 MCG PO TABS
25.0000 ug | ORAL_TABLET | Freq: Every day | ORAL | Status: DC
  Administered 2022-06-24 – 2022-07-02 (×9): 25 ug via ORAL
  Filled 2022-06-23 (×9): qty 1

## 2022-06-23 MED ORDER — CLOPIDOGREL BISULFATE 75 MG PO TABS
75.0000 mg | ORAL_TABLET | Freq: Every day | ORAL | Status: DC
  Administered 2022-06-24: 75 mg via ORAL
  Filled 2022-06-23: qty 1

## 2022-06-23 NOTE — H&P (Signed)
History and Physical    Albert Powell [id number] DOB: 17-Apr-1941 DOA: 06/23/2022  PCP: Leeroy Cha, MD   Patient coming from: Home   Chief Complaint: SOB   HPI: Albert Powell is a pleasant 81 y.o. male with medical history significant for hypertension, type 2 diabetes mellitus, CAD, atrial fibrillation on Eliquis, and chronic systolic CHF who presents to the emergency department with shortness of breath.  Patient reports 2 to 3 days of worsening shortness of breath but denies chest pain, cough, fever, or leg swelling.  He reports adherence with his medications including diuretics.  ED Course: Upon arrival to the ED, patient is found to be afebrile and saturating low 90s on room air with tachypnea, transient tachycardia, and severely elevated blood pressures.  EKG features atrial fibrillation and chest x-ray notable for persistent retrocardiac opacity and bilateral trace to small pleural effusions.  Blood work notable for potassium 3.0, creatinine 1.57, troponin 64, and BNP 3166.  He was treated with Norvasc, Coreg, IV Lasix, oral potassium, and started on nitroglycerin infusion in the ED.  Review of Systems:  All other systems reviewed and apart from HPI, are negative.  Past Medical History:  Diagnosis Date   Atrial fibrillation ([place]) 03/30/2020   Bradycardia, sinus 02/18/2013   CAD (coronary artery disease)    stent to RCA 2003 also had 40% lesion at that time; NUCLEAR STRESS TEST, 01/16/2010 - normal  now with cath 80-90% stenosis in LAD, will try medical therapy if no improvement PCI   Carotid artery disease ([name])    Cataract    CHF (congestive heart failure) ([place])    Claudication ([place])    LEA DUPLEX, 07/07/2008 - Normal   COPD (chronic obstructive pulmonary disease) ([place])    DDD (degenerative disc disease) 2008   Diabetes mellitus    GERD (gastroesophageal reflux disease)    H/O hiatal hernia    History of colonoscopy 2004   finding of tics and AMV only no  polpys   Hyperlipidemia 02/05/2013   Hypertension    Onychomycosis    Rosacea    Tobacco abuse 02/05/2013    Past Surgical History:  Procedure Laterality Date   ABDOMINAL AORTOGRAM W/LOWER EXTREMITY N/A 03/30/2018   Procedure: ABDOMINAL AORTOGRAM W/LOWER EXTREMITY;  Surgeon: Serafina Mitchell, MD;  Location: [facility];  Service: Cardiovascular;  Laterality: N/A;   ABDOMINAL AORTOGRAM W/LOWER EXTREMITY Bilateral 08/27/2020   Procedure: ABDOMINAL AORTOGRAM W/LOWER EXTREMITY;  Surgeon: Lorretta Harp, MD;  Location: [facility];  Service: Cardiovascular;  Laterality: Bilateral;   APPENDECTOMY     CARDIAC CATHETERIZATION  08/29/2002   2-vessel CAD with high-grade stenoses in RCA; stenting to RCA   CARDIAC CATHETERIZATION  2014   80-90% lesion will try medical therapy   CARDIAC SURGERY     stents  2003   CARDIOVERSION N/A 11/01/2013   Procedure: [place] COMPRESSION;  Surgeon: Lorretta Harp, MD;  Location: [facility] CATH LAB;  Service: Cardiovascular;  Laterality: N/A;   CAROTID ANGIOGRAM N/A 04/25/2013   Procedure: CAROTID ANGIOGRAM;  Surgeon: Lorretta Harp, MD;  Location[facility] C[facility];  Service: Cardiovascular;  Laterality: N/A;   CAROTID ENDARTERECTOMY     COLON RESECTION  3/04, 6/04    1 bleeding diverticulitis, 2 complete colectomy   COLON SURGERY  2005   renal pouch rectal anastimosis   CORONARY ANGIOPLASTY WITH STENT PLACEMENT  09/2002   2 stents to RCA   ENDARTERECTOMY Left 06/02/2013   Procedure: ENDARTERECTOMY  CAROTID-LEFT;  Surgeon: Serafina Mitchell, MD;  Location: [facility];  Service: Vascular;  Laterality: Left;   INTRAVASCULAR PRESSURE WIRE/FFR STUDY N/A 04/02/2020   Procedure: INTRAVASCULAR PRESSURE WIRE/FFR STUDY;  Surgeon: Lorretta Harp, MD;  Location: [place] [facility];  Service: Cardiovascular;  Laterality: N/A;  DFR - RCA   LEFT HEART CATH AND CORONARY ANGIOGRAPHY N/A 04/02/2020   Procedure: LEFT HEART CATH AND CORONARY ANGIOGRAPHY;  Surgeon:  Lorretta Harp, MD;  Location: [facility] [facility];  Service: Cardiovascular;  Laterality: N/A;   LEFT HEART CATHETERIZATION WITH CORONARY ANGIOGRAM N/A 02/08/2013   Procedure: LEFT HEART CATHETERIZATION WITH CORONARY ANGIOGRAM;  Surgeon: Lorretta Harp, MD;  Location: [facility] CATH LAB;  Service: Cardiovascular;  Laterality: N/A;   LOWER EXTREMITY ANGIOGRAM N/A 10/20/2013   Procedure: LOWER EXTREMITY ANGIOGRAM;  Surgeon: Lorretta Harp, MD;  Location: [facility] CATH LAB;  Service: Cardiovascular;  Laterality: N/A;   PATCH ANGIOPLASTY Left 06/02/2013   Procedure: PATCH ANGIOPLASTY;  Surgeon: Serafina Mitchell, MD;  Location: [facility] OR;  Service: Vascular;  Laterality: Left;   PERIPHERAL VASCULAR INTERVENTION Bilateral 03/30/2018   Procedure: PERIPHERAL VASCULAR INTERVENTION;  Surgeon: Serafina Mitchell, MD;  Location: [place] [facility];  Service: Cardiovascular;  Laterality: Bilateral;  common iliacs   RIGHT/LEFT HEART CATH AND CORONARY ANGIOGRAPHY N/A 05/20/2022   Procedure: RIGHT/LEFT HEART CATH AND CORONARY ANGIOGRAPHY;  Surgeon: Early Osmond, MD;  Location: [place] [facility];  Service: Cardiovascular;  Laterality: N/A;   TONSILLECTOMY      Social History:   reports that he quit smoking about 3 years ago. His smoking use included cigarettes and e-cigarettes. He has a 85.50 pack-year smoking history. He has never used smokeless tobacco. He reports current alcohol use of about 1.0 standard drink of alcohol per week. He reports that he does not use drugs.  Allergies  Allergen Reactions   Fentanyl Other (See Comments)    Behavioral changes   Gabapentin Swelling   Lisinopril Other (See Comments)    unknown   Lyrica [Pregabalin] Other (See Comments)    unknown   Metformin Diarrhea   Propofol Other (See Comments)    Heart rate dropped    Family History  Problem Relation Age of Onset   Heart disease Father        heart attack at 54   Heart attack Father    Hyperlipidemia Father    Colonic polyp  Mother        that bled out   COPD Mother    Diabetes Mother    Heart disease Sister    Colonic polyp Sister    Stroke Maternal Grandfather    Heart attack Paternal Grandfather    Other Neg Hx        hypogonadism     Prior to Admission medications   Medication Sig Start Date End Date Taking? Authorizing Provider  amLODipine (NORVASC) 5 MG tablet Take 1 tablet (5 mg total) by mouth daily. 06/07/22 07/07/22 Yes Arrien, Jimmy Picket, MD  apixaban (ELIQUIS) 5 MG TABS tablet Take 1 tablet (5 mg total) by mouth 2 (two) times daily. 03/21/21  Yes Katherine Roan, MD  atorvastatin (LIPITOR) 40 MG tablet Take 1 tablet (40 mg total) by mouth daily. 05/22/22 06/23/22 Yes Arrien, Jimmy Picket, MD  carvedilol (COREG) 12.5 MG tablet Take 1 tablet (12.5 mg total) by mouth 2 (two) times daily with a meal. 06/06/22 07/06/22 Yes Arrien, Jimmy Picket, MD  clopidogrel (PLAVIX) 75 MG  tablet Take 1 tablet (75 mg total) by mouth daily. 01/04/21  Yes Little Ishikawa, MD  dapagliflozin propanediol (FARXIGA) 10 MG TABS tablet Take 1 tablet (10 mg total) by mouth daily before breakfast. 04/25/21  Yes Winfrey, Jenne Pane, MD  furosemide (LASIX) 80 MG tablet Take 1 tablet (80 mg total) by mouth daily. 06/07/22 07/07/22 Yes Arrien, Jimmy Picket, MD  glipiZIDE (GLUCOTROL) 5 MG tablet Take 5 mg by mouth daily before breakfast.   Yes [provider]  hydrALAZINE (APRESOLINE) 100 MG tablet Take 1 tablet (100 mg total) by mouth 3 (three) times daily. 05/21/22 06/23/22 Yes Arrien, Jimmy Picket, MD  isosorbide mononitrate (IMDUR) 60 MG 24 hr tablet Take 2 tablets (120 mg total) by mouth daily. 05/22/22 06/23/22 Yes Arrien, Jimmy Picket, MD  levothyroxine (SYNTHROID) 25 MCG tablet TAKE ONE TABLET BY MOUTH DAILY BEFORE BREAKFAST Patient taking differently: Take 25 mcg by mouth daily before breakfast. 03/13/22  Yes Lorretta Harp, MD  pantoprazole (PROTONIX) 40 MG tablet Take 40 mg by mouth every other day.    Yes [provider]  sacubitril-valsartan (ENTRESTO) 97-103 MG Take 1 tablet by mouth 2 (two) times daily. 12/13/21  Yes Lyda Jester M, PA-C  spironolactone (ALDACTONE) 25 MG tablet Take 1 tablet (25 mg total) by mouth daily. 06/07/22 07/07/22 Yes Arrien, Jimmy Picket, MD    Physical Exam: Vitals:   06/23/22 1845 06/23/22 1900 06/23/22 1945 06/23/22 2120  BP: (!) 190/144 (!) 209/100 (!) 190/115 (!) 203/116  Pulse: (!) 107 (!) 115 64 85  Resp: (!) 23 (!) 25 (!) 24 (!) 26  Temp:      TempSrc:      SpO2: 94% 94% 94% 96%  Weight:      Height:        Constitutional: NAD, calm  Eyes: PERTLA, lids and conjunctivae normal ENMT: Mucous membranes are moist. Posterior pharynx clear of any exudate or lesions.   Neck: supple, no masses  Respiratory: Dyspneic with speech. No wheezing. No accessory muscle use.  Cardiovascular: Rate ~100 and irregularly irregular. No leg swelling. Abdomen: No distension, no tenderness, soft. Bowel sounds active.  Musculoskeletal: no clubbing / cyanosis. No joint deformity upper and lower extremities.   Skin: no significant rashes, lesions, ulcers. Warm, dry, well-perfused. Neurologic: CN 2-12 grossly intact. Moving all extremities. Alert and oriented.  Psychiatric: Pleasant. Cooperative.    Labs and Imaging on Admission: I have personally reviewed following labs and imaging studies  CBC: Recent Labs  Lab 06/23/22 1752  WBC 9.5  NEUTROABS 7.3  HGB 14.0  HCT 42.4  MCV 91.6  PLT [id number]   Basic Metabolic Panel: Recent Labs  Lab 06/23/22 1752  NA 137  K 3.0*  CL 98  CO2 26  GLUCOSE 182*  BUN 15  CREATININE 1.57*  CALCIUM 8.8*  MG 2.1   GFR: Estimated Creatinine Clearance: 43.7 mL/min (A) (by C-G formula based on SCr of 1.57 mg/dL (H)). Liver Function Tests: Recent Labs  Lab 06/23/22 1752  AST 23  ALT 34  ALKPHOS 73  BILITOT 0.7  PROT 6.5  ALBUMIN 3.3*   No results for input(s): "LIPASE", "AMYLASE" in the last 168  hours. No results for input(s): "AMMONIA" in the last 168 hours. Coagulation Profile: No results for input(s): "INR", "PROTIME" in the last 168 hours. Cardiac Enzymes: No results for input(s): "CKTOTAL", "CKMB", "CKMBINDEX", "TROPONINI" in the last 168 hours. BNP (last 3 results) No results for input(s): "PROBNP" in the last 8760 hours. HbA1C:  No results for input(s): "HGBA1C" in the last 72 hours. CBG: No results for input(s): "GLUCAP" in the last 168 hours. Lipid Profile: No results for input(s): "CHOL", "HDL", "LDLCALC", "TRIG", "CHOLHDL", "LDLDIRECT" in the last 72 hours. Thyroid Function Tests: No results for input(s): "TSH", "T4TOTAL", "FREET4", "T3FREE", "THYROIDAB" in the last 72 hours. Anemia Panel: No results for input(s): "VITAMINB12", "FOLATE", "FERRITIN", "TIBC", "IRON", "RETICCTPCT" in the last 72 hours. Urine analysis:    Component Value Date/Time   COLORURINE YELLOW 01/01/2021 1351   APPEARANCEUR CLEAR 01/01/2021 1351   LABSPEC 1.009 01/01/2021 1351   PHURINE 6.0 01/01/2021 1351   GLUCOSEU >=500 (A) 01/01/2021 1351   HGBUR SMALL (A) 01/01/2021 1351   BILIRUBINUR NEGATIVE 01/01/2021 1351   KETONESUR NEGATIVE 01/01/2021 1351   PROTEINUR >=300 (A) 01/01/2021 1351   UROBILINOGEN 1.0 05/31/2013 1432   NITRITE NEGATIVE 01/01/2021 1351   LEUKOCYTESUR NEGATIVE 01/01/2021 1351   Sepsis Labs: @LABRCNTIP (procalcitonin:4,lacticidven:4) )No results found for this or any previous visit (from the past 240 hour(s)).   Radiological Exams on Admission: DG Chest Port 1 View  Result Date: 06/23/2022 CLINICAL DATA:  dyspnea EXAM: PORTABLE CHEST 1 VIEW COMPARISON:  Chest x-ray 06/03/2022, CT angiography chest 06/02/2022, chest x-ray 05/14/2022 FINDINGS: The heart and mediastinal contours are unchanged. Aortic calcification Persistent retrocardiac airspace opacity. No pulmonary edema. Persistent bilateral trace to small volume pleural effusions. No pneumothorax. No acute osseous  abnormality. Neural simulator leads overlying the thoracic spine. IMPRESSION: 1. Persistent retrocardiac airspace opacity. Persistent bilateral trace to small volume pleural effusions. 2.  Aortic Atherosclerosis (ICD10-I70.0). Electronically Signed   By: Iven Finn M.D.   On: 06/23/2022 18:47    EKG: Independently reviewed. Atrial fibrillation.   Assessment/Plan   1. Acute on chronic systolic CHF  - Presents with progressive SOB despite adherence with diuretics at home  - EF was 25-30% on TTE from May 14, 2022  - Treated with 80 mg IV Lasix in ED   - Continue diuresis with 80 mg IV Lasix q12h, continue Coreg, Entresto, and Farxiga, monitor wt and I/Os, monitor renal function and electrolytes    2. Hypertensive crisis   - BP as high as 209/100 in ED  - Started on IV nitroglycerin infusion in ED  - Continue diuresis with IV Lasix, continue Coreg, hydralazine, and Norvasc, continue IV nitroglycerin for now with titration for goal SBP 160 for tonight    3. PAF  - Continue Eliquis and Coreg    4. Type II DM  - A1c was 7.2% in August 2023  - Continue Farxiga, check CBGs and use low-intensity SSI if needed    5. Hypothyroidism  - Continue Synthroid    6. Elevated troponin  - Troponin was 64 and then 66 in ED  - No chest pain, low-suspicion for ACS, likely secondary to acute CHF and severely elevated BP   - Continue to treat CHF and HTN as above, continue Lipitor, Imdur, beta-blocker, and Plavix   7. Hypokalemia  - Replacing    DVT prophylaxis: Eliquis  Code Status: Full  Level of Care: Level of care: Progressive Family Communication: none present  Disposition Plan:  Patient is from: home  Anticipated d/c is to: TBD Anticipated d/c date is: 06/26/22  Patient currently: Pending improved volume status, transition to oral medications Consults called: none  Admission status: Inpatient     Vianne Bulls, MD [facility]  06/23/2022, 10:13 PM

## 2022-06-23 NOTE — ED Triage Notes (Signed)
Patient brought in by [facility] for SOB and weakness x2 days. Hx of afib. Compliant with meds besides today. Patient was hypertensive for EMS. Per EMS, patient went hypotensive with no radial pulses. Patient is alert at this time

## 2022-06-23 NOTE — ED Provider Notes (Signed)
[name] EMERGENCY DEPARTMENT Provider Note   CSN: [id number] Arrival date & time: 06/23/22  1746     History {Add pertinent medical, surgical, social history, OB history to HPI:1} No chief complaint on file.   Albert Powell is a 81 y.o. male.  HPI Patient presents for generalized weakness and shortness of breath over the past 2 days.  Medical history includes T2DM, CAD, PAD, HTN, atrial fibrillation, CHF, GERD, HLD.  He had 2 admissions to the hospital last month for CHF and atrial fibrillation.  Medications include amlodipine, Eliquis, Coreg, Plavix, Entresto, Lasix (80 mg daily), hydralazine, Imdur, Synthroid, and spironolactone.  He states that he has been adherent to his medications.  He has not taken any today.  Due to his progressive symptoms, EMS was called.  EMS noted hypoxia with even minimal exertion into the mid to high 80s.  He was placed on supplemental oxygen by nasal cannula.  He is not on oxygen at baseline.  Patient has been tachypneic and tachycardic in the range of 100-120.  Currently, patient endorses shortness of breath at rest.  He denies any areas of discomfort.     Home Medications Prior to Admission medications   Medication Sig Start Date End Date Taking? Authorizing Provider  amLODipine (NORVASC) 5 MG tablet Take 1 tablet (5 mg total) by mouth daily. 06/07/22 07/07/22  Arrien, York Ram, MD  apixaban (ELIQUIS) 5 MG TABS tablet Take 1 tablet (5 mg total) by mouth 2 (two) times daily. 03/21/21   Angelita Ingles, MD  atorvastatin (LIPITOR) 40 MG tablet Take 1 tablet (40 mg total) by mouth daily. 05/22/22 06/21/22  Arrien, York Ram, MD  carvedilol (COREG) 12.5 MG tablet Take 1 tablet (12.5 mg total) by mouth 2 (two) times daily with a meal. 06/06/22 07/06/22  Arrien, York Ram, MD  clopidogrel (PLAVIX) 75 MG tablet Take 1 tablet (75 mg total) by mouth daily. 01/04/21   Azucena Fallen, MD  dapagliflozin propanediol (FARXIGA)  10 MG TABS tablet Take 1 tablet (10 mg total) by mouth daily before breakfast. 04/25/21   Angelita Ingles, MD  furosemide (LASIX) 80 MG tablet Take 1 tablet (80 mg total) by mouth daily. 06/07/22 07/07/22  Arrien, York Ram, MD  glipiZIDE (GLUCOTROL) 5 MG tablet Take 5 mg by mouth daily before breakfast.    [provider]  hydrALAZINE (APRESOLINE) 100 MG tablet Take 1 tablet (100 mg total) by mouth 3 (three) times daily. 05/21/22 06/20/22  Arrien, York Ram, MD  isosorbide mononitrate (IMDUR) 60 MG 24 hr tablet Take 2 tablets (120 mg total) by mouth daily. 05/22/22 06/21/22  Arrien, York Ram, MD  levothyroxine (SYNTHROID) 25 MCG tablet TAKE ONE TABLET BY MOUTH DAILY BEFORE BREAKFAST Patient taking differently: Take 25 mcg by mouth daily before breakfast. 03/13/22   [name], MD  pantoprazole (PROTONIX) 40 MG tablet Take 40 mg by mouth every other day.    [provider]  sacubitril-valsartan (ENTRESTO) 97-103 MG Take 1 tablet by mouth 2 (two) times daily. 12/13/21   Allayne Butcher, PA-C  spironolactone (ALDACTONE) 25 MG tablet Take 1 tablet (25 mg total) by mouth daily. 06/07/22 07/07/22  Arrien, York Ram, MD      Allergies    Fentanyl, Gabapentin, Lisinopril, Lyrica [pregabalin], Metformin, and Propofol    Review of Systems   Review of Systems  Constitutional:  Positive for fatigue.  Respiratory:  Positive for shortness of breath.   Neurological:  Positive for  weakness (Generalized).  All other systems reviewed and are negative.   Physical Exam Updated Vital Signs There were no vitals taken for this visit. Physical Exam Vitals and nursing note reviewed.  Constitutional:      General: He is not in acute distress.    Appearance: Normal appearance. He is well-developed. He is not toxic-appearing or diaphoretic.  HENT:     Head: Normocephalic and atraumatic.     Right Ear: External ear normal.     Left Ear: External ear normal.      Nose: Nose normal.     Mouth/Throat:     Mouth: Mucous membranes are moist.     Pharynx: Oropharynx is clear.  Eyes:     Extraocular Movements: Extraocular movements intact.     Conjunctiva/sclera: Conjunctivae normal.  Cardiovascular:     Rate and Rhythm: Tachycardia present. Rhythm irregular.     Heart sounds: No murmur heard. Pulmonary:     Effort: Pulmonary effort is normal. No respiratory distress.     Breath sounds: No stridor. Rales present. No wheezing or rhonchi.  Abdominal:     General: There is no distension.     Palpations: Abdomen is soft.     Tenderness: There is no abdominal tenderness.  Musculoskeletal:        General: No swelling.     Cervical back: Normal range of motion and neck supple.     Right lower leg: Edema present.     Left lower leg: Edema present.  Skin:    General: Skin is warm and dry.     Coloration: Skin is not jaundiced or pale.  Neurological:     General: No focal deficit present.     Mental Status: He is alert and oriented to person, place, and time.     Cranial Nerves: No cranial nerve deficit.     Sensory: No sensory deficit.     Motor: No weakness.     Coordination: Coordination normal.  Psychiatric:        Mood and Affect: Mood normal.        Behavior: Behavior normal.        Thought Content: Thought content normal.        Judgment: Judgment normal.     ED Results / Procedures / Treatments   Labs (all labs ordered are listed, but only abnormal results are displayed) Labs Reviewed - No data to display  EKG None  Radiology No results found.  Procedures Procedures  {Document cardiac monitor, telemetry assessment procedure when appropriate:1}  Medications Ordered in ED Medications - No data to display  ED Course/ Medical Decision Making/ A&P                           Medical Decision Making Amount and/or Complexity of Data Reviewed Labs: ordered. Radiology: ordered.  Risk Prescription drug management.   This  patient presents to the ED for concern of ***, this involves an extensive number of treatment options, and is a complaint that carries with it a high risk of complications and morbidity.  The differential diagnosis includes ***   Co morbidities that complicate the patient evaluation  ***   Additional history obtained:  Additional history obtained from *** External records from outside source obtained and reviewed including ***   Lab Tests:  I Ordered, and personally interpreted labs.  The pertinent results include:  ***   Imaging Studies ordered:  I ordered imaging studies  including ***  I independently visualized and interpreted imaging which showed *** I agree with the radiologist interpretation   Cardiac Monitoring: / EKG:  The patient was maintained on a cardiac monitor.  I personally viewed and interpreted the cardiac monitored which showed an underlying rhythm of: ***   Consultations Obtained:  I requested consultation with the ***,  and discussed lab and imaging findings as well as pertinent plan - they recommend: ***   Problem List / ED Course / Critical interventions / Medication management  Patient presents from home for weakness and shortness of breath over the past 2 days.  He has a history of atrial fibrillation and CHF and has had 2 hospital admissions in the past month.  Vital signs on arrival are notable for tachycardia in the range of 120 with irregular rhythm.  Blood pressures are elevated.  He states that he has not taken any of his medications today.  His breathing is slightly labored and crackles are present on auscultation of bibasilar lung fields.  On room air, he is found to be intermittently hypoxic with good waveform.  He was placed on supplemental oxygen.  He does not wear supplemental oxygen at baseline.  Laboratory work-up was initiated.  Potassium was low and replacement potassium was ordered.  Patient's kidney function is at baseline.  Troponin is  mildly elevated, also consistent with baseline prior labs.  Home blood pressure medications and 80 mg of IV Lasix were ordered.  Chest x-ray showed similar findings to prior studies: Retrocardiac airspace opacity in trace bilateral pleural effusions.  BNP was***.  On reassessment,***. I ordered medication including ***  for ***  Reevaluation of the patient after these medicines showed that the patient {resolved/improved/worsened:23923::"improved"} I have reviewed the patients home medicines and have made adjustments as needed   Social Determinants of Health:  ***   Test / Admission - Considered:  ***   {Document critical care time when appropriate:1} {Document review of labs and clinical decision tools ie heart score, Chads2Vasc2 etc:1}  {Document your independent review of radiology images, and any outside records:1} {Document your discussion with family members, caretakers, and with consultants:1} {Document social determinants of health affecting pt's care:1} {Document your decision making why or why not admission, treatments were needed:1} Final Clinical Impression(s) / ED Diagnoses Final diagnoses:  None    Rx / DC Orders ED Discharge Orders     None

## 2022-06-23 NOTE — ED Notes (Signed)
Pt resting in bed pt states breathing feels more comfortable with nasal canula. Pt denies chest pain and any other pain at this time  A&Ox4  Call light and urinal within reach

## 2022-06-24 ENCOUNTER — Inpatient Hospital Stay: Payer: Self-pay

## 2022-06-24 DIAGNOSIS — I2510 Atherosclerotic heart disease of native coronary artery without angina pectoris: Secondary | ICD-10-CM

## 2022-06-24 DIAGNOSIS — I110 Hypertensive heart disease with heart failure: Secondary | ICD-10-CM | POA: Diagnosis not present

## 2022-06-24 DIAGNOSIS — N183 Chronic kidney disease, stage 3 unspecified: Secondary | ICD-10-CM

## 2022-06-24 DIAGNOSIS — I4891 Unspecified atrial fibrillation: Secondary | ICD-10-CM

## 2022-06-24 DIAGNOSIS — N179 Acute kidney failure, unspecified: Secondary | ICD-10-CM

## 2022-06-24 DIAGNOSIS — I5023 Acute on chronic systolic (congestive) heart failure: Secondary | ICD-10-CM

## 2022-06-24 LAB — GLUCOSE, CAPILLARY: Glucose-Capillary: 131 mg/dL — ABNORMAL HIGH (ref 70–99)

## 2022-06-24 LAB — CBC
HCT: 39 % (ref 39.0–52.0)
Hemoglobin: 13.3 g/dL (ref 13.0–17.0)
MCH: 30.3 pg (ref 26.0–34.0)
MCHC: 34.1 g/dL (ref 30.0–36.0)
MCV: 88.8 fL (ref 80.0–100.0)
Platelets: 311 10*3/uL (ref 150–400)
RBC: 4.39 MIL/uL (ref 4.22–5.81)
RDW: 13.2 % (ref 11.5–15.5)
WBC: 8.4 10*3/uL (ref 4.0–10.5)
nRBC: 0 % (ref 0.0–0.2)

## 2022-06-24 LAB — PROTIME-INR
INR: 1.4 — ABNORMAL HIGH (ref 0.8–1.2)
Prothrombin Time: 17.1 seconds — ABNORMAL HIGH (ref 11.4–15.2)

## 2022-06-24 LAB — BASIC METABOLIC PANEL
Anion gap: 15 (ref 5–15)
BUN: 15 mg/dL (ref 8–23)
CO2: 24 mmol/L (ref 22–32)
Calcium: 8.7 mg/dL — ABNORMAL LOW (ref 8.9–10.3)
Chloride: 99 mmol/L (ref 98–111)
Creatinine, Ser: 1.86 mg/dL — ABNORMAL HIGH (ref 0.61–1.24)
GFR, Estimated: 36 mL/min — ABNORMAL LOW (ref >60)
Glucose, Bld: 143 mg/dL — ABNORMAL HIGH (ref 70–99)
Potassium: 3 mmol/L — ABNORMAL LOW (ref 3.5–5.1)
Sodium: 138 mmol/L (ref 135–145)

## 2022-06-24 LAB — CBG MONITORING, ED
Glucose-Capillary: 148 mg/dL — ABNORMAL HIGH (ref 70–99)
Glucose-Capillary: 161 mg/dL — ABNORMAL HIGH (ref 70–99)
Glucose-Capillary: 179 mg/dL — ABNORMAL HIGH (ref 70–99)
Glucose-Capillary: 214 mg/dL — ABNORMAL HIGH (ref 70–99)

## 2022-06-24 LAB — BRAIN NATRIURETIC PEPTIDE: B Natriuretic Peptide: 3698.8 pg/mL — ABNORMAL HIGH (ref 0.0–100.0)

## 2022-06-24 LAB — COOXEMETRY PANEL
Carboxyhemoglobin: 1.8 % — ABNORMAL HIGH (ref 0.5–1.5)
Methemoglobin: 0.8 % (ref 0.0–1.5)
O2 Saturation: 98.2 %
Total hemoglobin: 14.6 g/dL (ref 12.0–16.0)

## 2022-06-24 LAB — D-DIMER, QUANTITATIVE: D-Dimer, Quant: 0.86 ug/mL-FEU — ABNORMAL HIGH (ref 0.00–0.50)

## 2022-06-24 LAB — MAGNESIUM: Magnesium: 2 mg/dL (ref 1.7–2.4)

## 2022-06-24 MED ORDER — APIXABAN 2.5 MG PO TABS
2.5000 mg | ORAL_TABLET | Freq: Two times a day (BID) | ORAL | Status: AC
  Administered 2022-06-24 – 2022-06-30 (×13): 2.5 mg via ORAL
  Filled 2022-06-24 (×13): qty 1

## 2022-06-24 MED ORDER — POTASSIUM CHLORIDE CRYS ER 20 MEQ PO TBCR
40.0000 meq | EXTENDED_RELEASE_TABLET | Freq: Once | ORAL | Status: AC
  Administered 2022-06-24: 40 meq via ORAL
  Filled 2022-06-24: qty 2

## 2022-06-24 MED ORDER — SPIRONOLACTONE 12.5 MG HALF TABLET
12.5000 mg | ORAL_TABLET | Freq: Every day | ORAL | Status: DC
  Administered 2022-06-24: 12.5 mg via ORAL
  Filled 2022-06-24: qty 1

## 2022-06-24 MED ORDER — AMIODARONE HCL 200 MG PO TABS
400.0000 mg | ORAL_TABLET | Freq: Two times a day (BID) | ORAL | Status: DC
  Administered 2022-06-24 (×2): 400 mg via ORAL
  Filled 2022-06-24 (×2): qty 2

## 2022-06-24 MED ORDER — SODIUM CHLORIDE 0.9 % IV SOLN: INTRAVENOUS | Status: DC

## 2022-06-24 MED ORDER — LACTATED RINGERS IV BOLUS
250.0000 mL | Freq: Once | INTRAVENOUS | Status: AC
  Administered 2022-06-24: 250 mL via INTRAVENOUS

## 2022-06-24 MED ORDER — POTASSIUM CHLORIDE CRYS ER 20 MEQ PO TBCR
20.0000 meq | EXTENDED_RELEASE_TABLET | Freq: Once | ORAL | Status: AC
  Administered 2022-06-24: 20 meq via ORAL
  Filled 2022-06-24: qty 1

## 2022-06-24 MED ORDER — AMIODARONE HCL 200 MG PO TABS: 200.0000 mg | ORAL_TABLET | Freq: Two times a day (BID) | ORAL | Status: DC

## 2022-06-24 MED ORDER — FUROSEMIDE 10 MG/ML IJ SOLN
80.0000 mg | Freq: Two times a day (BID) | INTRAMUSCULAR | Status: AC
  Administered 2022-06-24: 80 mg via INTRAVENOUS
  Filled 2022-06-24: qty 8

## 2022-06-24 NOTE — Consult Note (Addendum)
Advanced Heart Failure Team Consult Note   Primary Physician: Leeroy Cha, MD PCP-Cardiologist:  Quay Burow, MD  Reason for Consultation: acute on chronic combined systolic and diastolic heart failure  HPI:    Albert Powell is seen today for evaluation of acute on chronic combined systolic and diastolic heart failure, at the request of Dr. Florene Glen.   Mr Byun  is 81 y.o. male with a history PAF, CAD s/p stenting x2 to RCA in 2003 with residual LAD disease, carotid artery disease s/p carotid artery stenosis s/p LICA endarterectomy [id number], peripheral arterial disease, hypertension, hyperlipidemia, T2DM, renal artery stenosis.   2003 required PCI x2 to RCA    Admitted 12/2020 for hypertensive emergency, flash pulmonary edema, A. fib with RVR due to poor medication adherence.  Home meds were restarted, given one dose of IV lasix 40mg  and he returned back to baseline.     Admitted [id number] with A/C diastolic heart failure and HTN urgency. Diuresed with IV lasix.    Initially seen in [facility] 03/21/21 and was started on farxiga and lasix was switched to as needed.    Admitted 07/2021 A/C HFpEF and hypertension. BNP >4500. Diuresed with IV lasix and transitioned to lasix 20 mg po daily. Discharge weigth 218 pounds. Referred to back to [facility] clinic. At [facility] clinic f/us he was noted to be mildly fluid overloaded and diuretics increased but ultimately referred back to Dr. Gwenlyn Found for further management.   Unfortunately, he was readmitted [id number] for a/c diastolic CHF, in the setting of hypertensive urgency and atrial fibrillation w/ RVR. Placed on IV Lasix and nitro gtt.  Echo showed normal LVEF 50-55%, G1DD, RV normal. No significant valvular dysfunction. Transitioned back to PO Lasix. Afib rate controlled w/ beta blocker. Referred back to [facility] clinic. D/c wt 224 lb. At visit, discussion was held regarding referral to [facility] for RHC and possible CardioMems, but unfortunately pt failed to f/u  after.   Since then, he has had multiple readmissions and ED visits for a/c CHF and hypertensive urgencies.   Admitted 8/23 w/ a/c CHF. Echo showed drop in EF, down to 25-30%, RV normal. Noted to be back in Afib w/ RVR when echo was done.   Readmitted again 9/23 for HF. R/LHC was done and showed mild to moderate obstructive coronary artery disease. CM out of proportion to degree of CAD. RHC showed normal filling pressures and perserved CO, CI 2.7 L/min/m.  Presented back to the ED yesterday w/ complaints of increased SOB x 2 days. BP markedly elevated 200s/130s. BNP 3,165. CXR w/ small b/l pleural effusions. HS trop 64>>66. EKG showed Afib 98 bpm. Scr 1.57 c/w baseline. K 3.0. O2 sats in 80s, improved w/ 2L [place]. Started on IV Lasix and NTG gtt.  Now off NTG after getting hypotensive, SBPs dropped into 70s. Given 250 cc bolus IVF per primary team. BP improved, 0000000 systolic.     2D Echo 04/2022 left ventricular ejection fraction, by estimation, is 25 to 30%. The left ventricle has severely decreased function. The left ventricle demonstrates regional wall motion abnormalities (see scoring diagram/findings for description). No LVH  1. Right ventricular systolic function is normal. The right ventricular size is normal. Tricuspid regurgitation signal is inadequate for assessing PA pressure. 2. 3. Trivial mitral valve regurgitation. No evidence of mitral stenosis. The aortic valve is tricuspid. There is mild calcification of the aortic valve. There is mild thickening of the aortic valve. Aortic valve sclerosis/calcification is present, without any evidence of  aortic stenosis. 4. The inferior vena cava is dilated in size with <50% respiratory variability, suggesting right atrial pressure of 15 mmHg.  [facility] 9/23   Prox LAD to Mid LAD lesion is 40% stenosed.   Prox Cx to Mid Cx lesion is 30% stenosed.   Prox RCA to Mid RCA lesion is 60% stenosed.   1st Diag lesion is 60% stenosed.   Dist LAD  lesion is 40% stenosed.   Non-stenotic Ost RCA to Prox RCA lesion was previously treated.   1.  Mild to moderate obstructive coronary artery disease; compared with the previous study the angiographic appearance looks relatively improved.  Overall the burden of coronary artery disease does not explain the patient's severe cardiomyopathy.   2.  Mean RA pressure of 5 mmHg and mean wedge pressure of 5 mmHg with normal cardiac output of 5.9 L/min and cardiac index of 2.7 L/min/m.   Recommendation: Goal-directed medical therapy  Review of Systems: [y] = yes, [ ]  = no   General: Weight gain [ ] ; Weight loss [ ] ; Anorexia [ ] ; Fatigue [ Y]; Fever [ ] ; Chills [ ] ; Weakness [ ]   Cardiac: Chest pain/pressure [ ] ; Resting SOB [ ] ; Exertional SOB [ Y]; Orthopnea [ ] ; Pedal Edema [ ] ; Palpitations [ ] ; Syncope [ ] ; Presyncope [ ] ; Paroxysmal nocturnal dyspnea[ ]   Pulmonary: Cough [ ] ; Wheezing[ ] ; Hemoptysis[ ] ; Sputum [ ] ; Snoring [ ]   GI: Vomiting[ ] ; Dysphagia[ ] ; Melena[ ] ; Hematochezia [ ] ; Heartburn[ ] ; Abdominal pain [ ] ; Constipation [ ] ; Diarrhea [ ] ; BRBPR [ ]   GU: Hematuria[ ] ; Dysuria [ ] ; Nocturia[ ]   Vascular: Pain in legs with walking [ ] ; Pain in feet with lying flat [ ] ; Non-healing sores [ ] ; Stroke [ ] ; TIA [ ] ; Slurred speech [ ] ;  Neuro: Headaches[ ] ; Vertigo[ ] ; Seizures[ ] ; Paresthesias[ ] ;Blurred vision [ ] ; Diplopia [ ] ; Vision changes [ ]   Ortho/Skin: Arthritis [ ] ; Joint pain [ ] ; Muscle pain [ ] ; Joint swelling [ ] ; Back Pain [ ] ; Rash [ ]   Psych: Depression[ ] ; Anxiety[ ]   Heme: Bleeding problems [ ] ; Clotting disorders [ ] ; Anemia [ ]   Endocrine: Diabetes [ ] ; Thyroid dysfunction[ ]   Home Medications Prior to Admission medications   Medication Sig Start Date End Date Taking? Authorizing Provider  amLODipine (NORVASC) 5 MG tablet Take 1 tablet (5 mg total) by mouth daily. 06/07/22 07/07/22 Yes Arrien, Jimmy Picket, MD  apixaban (ELIQUIS) 5 MG TABS tablet Take 1 tablet (5  mg total) by mouth 2 (two) times daily. 03/21/21  Yes Katherine Roan, MD  atorvastatin (LIPITOR) 40 MG tablet Take 1 tablet (40 mg total) by mouth daily. 05/22/22 06/23/22 Yes Arrien, Jimmy Picket, MD  carvedilol (COREG) 12.5 MG tablet Take 1 tablet (12.5 mg total) by mouth 2 (two) times daily with a meal. 06/06/22 07/06/22 Yes Arrien, Jimmy Picket, MD  clopidogrel (PLAVIX) 75 MG tablet Take 1 tablet (75 mg total) by mouth daily. 01/04/21  Yes Little Ishikawa, MD  dapagliflozin propanediol (FARXIGA) 10 MG TABS tablet Take 1 tablet (10 mg total) by mouth daily before breakfast. 04/25/21  Yes Winfrey, Jenne Pane, MD  furosemide (LASIX) 80 MG tablet Take 1 tablet (80 mg total) by mouth daily. 06/07/22 07/07/22 Yes Arrien, Jimmy Picket, MD  glipiZIDE (GLUCOTROL) 5 MG tablet Take 5 mg by mouth daily before breakfast.   Yes [provider]  hydrALAZINE (APRESOLINE) 100 MG tablet Take 1 tablet (100  mg total) by mouth 3 (three) times daily. 05/21/22 06/23/22 Yes Arrien, Jimmy Picket, MD  isosorbide mononitrate (IMDUR) 60 MG 24 hr tablet Take 2 tablets (120 mg total) by mouth daily. 05/22/22 06/23/22 Yes Arrien, Jimmy Picket, MD  levothyroxine (SYNTHROID) 25 MCG tablet TAKE ONE TABLET BY MOUTH DAILY BEFORE BREAKFAST Patient taking differently: Take 25 mcg by mouth daily before breakfast. 03/13/22  Yes Lorretta Harp, MD  pantoprazole (PROTONIX) 40 MG tablet Take 40 mg by mouth every other day.   Yes [provider]  sacubitril-valsartan (ENTRESTO) 97-103 MG Take 1 tablet by mouth 2 (two) times daily. 12/13/21  Yes Lyda Jester M, PA-C  spironolactone (ALDACTONE) 25 MG tablet Take 1 tablet (25 mg total) by mouth daily. 06/07/22 07/07/22 Yes Arrien, Jimmy Picket, MD    Past Medical History: Past Medical History:  Diagnosis Date   Atrial fibrillation ([place]) 03/30/2020   Bradycardia, sinus 02/18/2013   CAD (coronary artery disease)    stent to RCA 2003 also had 40% lesion  at that time; NUCLEAR STRESS TEST, 01/16/2010 - normal  now with cath 80-90% stenosis in LAD, will try medical therapy if no improvement PCI   Carotid artery disease ([name])    Cataract    CHF (congestive heart failure) ([place])    Claudication ([place])    LEA DUPLEX, 07/07/2008 - Normal   COPD (chronic obstructive pulmonary disease) ([place])    DDD (degenerative disc disease) 2008   Diabetes mellitus    GERD (gastroesophageal reflux disease)    H/O hiatal hernia    History of colonoscopy 2004   finding of tics and AMV only no polpys   Hyperlipidemia 02/05/2013   Hypertension    Onychomycosis    Rosacea    Tobacco abuse 02/05/2013    Past Surgical History: Past Surgical History:  Procedure Laterality Date   ABDOMINAL AORTOGRAM W/LOWER EXTREMITY N/A 03/30/2018   Procedure: ABDOMINAL AORTOGRAM W/LOWER EXTREMITY;  Surgeon: Serafina Mitchell, MD;  Location: [place] [facility];  Service: Cardiovascular;  Laterality: N/A;   ABDOMINAL AORTOGRAM W/LOWER EXTREMITY Bilateral 08/27/2020   Procedure: ABDOMINAL AORTOGRAM W/LOWER EXTREMITY;  Surgeon: Lorretta Harp, MD;  Location: [place] [facility];  Service: Cardiovascular;  Laterality: Bilateral;   APPENDECTOMY     CARDIAC CATHETERIZATION  08/29/2002   2-vessel CAD with high-grade stenoses in RCA; stenting to RCA   CARDIAC CATHETERIZATION  2014   80-90% lesion will try medical therapy   CARDIAC SURGERY     stents  2003   CARDIOVERSION N/A 11/01/2013   Procedure: [place] COMPRESSION;  Surgeon: Lorretta Harp, MD;  Location: [facility] CATH LAB;  Service: Cardiovascular;  Laterality: N/A;   CAROTID ANGIOGRAM N/A 04/25/2013   Procedure: CAROTID ANGIOGRAM;  Surgeon: Lorretta Harp, MD;  Location: [facility] CATH LAB;  Service: Cardiovascular;  Laterality: N/A;   CAROTID ENDARTERECTOMY     COLON RESECTION  3/04, 6/04    1 bleeding diverticulitis, 2 complete colectomy   COLON SURGERY  2005   renal pouch rectal anastimosis   CORONARY ANGIOPLASTY WITH STENT  PLACEMENT  09/2002   2 stents to RCA   ENDARTERECTOMY Left 06/02/2013   Procedure: ENDARTERECTOMY CAROTID-LEFT;  Surgeon: Serafina Mitchell, MD;  Location: [place];  Service: Vascular;  Laterality: Left;   INTRAVASCULAR PRESSURE WIRE/FFR STUDY N/A 04/02/2020   Procedure: INTRAVASCULAR PRESSURE WIRE/FFR STUDY;  Surgeon: Lorretta Harp, MD;  Location: [place] [facility];  Service: Cardiovascular;  Laterality: N/A;  DFR - RCA  LEFT HEART CATH AND CORONARY ANGIOGRAPHY N/A 04/02/2020   Procedure: LEFT HEART CATH AND CORONARY ANGIOGRAPHY;  Surgeon: Lorretta Harp, MD;  Location: [place] [facility];  Service: Cardiovascular;  Laterality: N/A;   LEFT HEART CATHETERIZATION WITH CORONARY ANGIOGRAM N/A 02/08/2013   Procedure: LEFT HEART CATHETERIZATION WITH CORONARY ANGIOGRAM;  Surgeon: Lorretta Harp, MD;  Location: [facility] CATH LAB;  Service: Cardiovascular;  Laterality: N/A;   LOWER EXTREMITY ANGIOGRAM N/A 10/20/2013   Procedure: LOWER EXTREMITY ANGIOGRAM;  Surgeon: Lorretta Harp, MD;  Location: [facility] CATH LAB;  Service: Cardiovascular;  Laterality: N/A;   PATCH ANGIOPLASTY Left 06/02/2013   Procedure: PATCH ANGIOPLASTY;  Surgeon: Serafina Mitchell, MD;  Location: [facility] OR;  Service: Vascular;  Laterality: Left;   PERIPHERAL VASCULAR INTERVENTION Bilateral 03/30/2018   Procedure: PERIPHERAL VASCULAR INTERVENTION;  Surgeon: Serafina Mitchell, MD;  Location: [place] [facility];  Service: Cardiovascular;  Laterality: Bilateral;  common iliacs   RIGHT/LEFT HEART CATH AND CORONARY ANGIOGRAPHY N/A 05/20/2022   Procedure: RIGHT/LEFT HEART CATH AND CORONARY ANGIOGRAPHY;  Surgeon: Early Osmond, MD;  Location: [place] [facility];  Service: Cardiovascular;  Laterality: N/A;   TONSILLECTOMY      Family History: Family History  Problem Relation Age of Onset   Heart disease Father        heart attack at 26   Heart attack Father    Hyperlipidemia Father    Colonic polyp Mother        that bled out   COPD Mother     Diabetes Mother    Heart disease Sister    Colonic polyp Sister    Stroke Maternal Grandfather    Heart attack Paternal Grandfather    Other Neg Hx        hypogonadism    Social History: Social History   Socioeconomic History   Marital status: Married    Spouse name: Yovan Minnick   Number of children: 1   Years of education: Not on file   Highest education level: High school graduate  Occupational History   Occupation: retired  Tobacco Use   Smoking status: Former    Packs/day: 1.50    Years: 57.00    Total pack years: 85.50    Types: Cigarettes, E-cigarettes    Quit date: 2020    Years since quitting: 3.7   Smokeless tobacco: Never   Tobacco comments:    pt states that he is using the vapor cigs--12/2021  Vaping Use   Vaping Use: Former   Start date: 09/15/2016   Substances: Nicotine  Substance and Sexual Activity   Alcohol use: Yes    Alcohol/week: 1.0 standard drink of alcohol    Types: 1 Glasses of wine per week    Comment: "very little"   Drug use: No   Sexual activity: Not on file  Other Topics Concern   Not on file  Social History Narrative   Not on file   Social Determinants of Health   Financial Resource Strain: Low Risk  (06/03/2022)   Overall Financial Resource Strain (CARDIA)    Difficulty of Paying Living Expenses: Not hard at all  Food Insecurity: No Food Insecurity (06/03/2022)   Hunger Vital Sign    Worried About Running Out of Food in the Last Year: Never true    Ran Out of Food in the Last Year: Never true  Transportation Needs: No Transportation Needs (06/03/2022)   PRAPARE - Hydrologist (Medical):  No    Lack of Transportation (Non-Medical): No  Physical Activity: Not on file  Stress: Not on file  Social Connections: Not on file    Allergies:  Allergies  Allergen Reactions   Fentanyl Other (See Comments)    Behavioral changes   Gabapentin Swelling   Lisinopril Other (See Comments)    unknown    Lyrica [Pregabalin] Other (See Comments)    unknown   Metformin Diarrhea   Propofol Other (See Comments)    Heart rate dropped    Objective:    Vital Signs:   Temp:  [98 F (36.7 C)-98.8 F (37.1 C)] 98.8 F (37.1 C) (10/10 0808) Pulse Rate:  [54-129] 65 (10/10 1000) Resp:  [18-36] 20 (10/10 1000) BP: (77-209)/(47-144) 99/57 (10/10 1000) SpO2:  [85 %-98 %] 93 % (10/10 1000) Weight:  [99.8 kg] 99.8 kg (10/09 1753)    Weight change: Filed Weights   06/23/22 1753  Weight: 99.8 kg    Intake/Output:   Intake/Output Summary (Last 24 hours) at 06/24/2022 1017 Last data filed at 06/24/2022 0335 Gross per 24 hour  Intake 124 ml  Output 800 ml  Net -676 ml      Physical Exam    General:  obese elderly male, No resp difficulty HEENT: normal Neck: supple. JVP 10-12 cm . Carotids 2+ bilat; no bruits. No lymphadenopathy or thyromegaly appreciated. Cor: PMI nondisplaced. Irregularly irregular rhythm. No rubs, gallops or murmurs. Lungs: decreased BS at the bases b/l  Abdomen: obese, soft, nontender, nondistended. No hepatosplenomegaly. No bruits or masses. Good bowel sounds. Extremities: no cyanosis, clubbing, rash, edema Neuro: alert & orientedx3, cranial nerves grossly intact. moves all 4 extremities w/o difficulty. Affect pleasant   Telemetry   Afib 80s   EKG    Atrial fibrillation 98 bpm   Labs   Basic Metabolic Panel: Recent Labs  Lab 06/23/22 1752 06/24/22 0358  NA 137 138  K 3.0* 3.0*  CL 98 99  CO2 26 24  GLUCOSE 182* 143*  BUN 15 15  CREATININE 1.57* 1.86*  CALCIUM 8.8* 8.7*  MG 2.1 2.0    Liver Function Tests: Recent Labs  Lab 06/23/22 1752  AST 23  ALT 34  ALKPHOS 73  BILITOT 0.7  PROT 6.5  ALBUMIN 3.3*   No results for input(s): "LIPASE", "AMYLASE" in the last 168 hours. No results for input(s): "AMMONIA" in the last 168 hours.  CBC: Recent Labs  Lab 06/23/22 1752 06/24/22 0358  WBC 9.5 8.4  NEUTROABS 7.3  --   HGB 14.0 13.3   HCT 42.4 39.0  MCV 91.6 88.8  PLT 307 311    Cardiac Enzymes: No results for input(s): "CKTOTAL", "CKMB", "CKMBINDEX", "TROPONINI" in the last 168 hours.  BNP: BNP (last 3 results) Recent Labs    06/05/22 0254 06/06/22 0206 06/23/22 1752  BNP 943.1* 196.2* 3,165.5*    ProBNP (last 3 results) No results for input(s): "PROBNP" in the last 8760 hours.   CBG: Recent Labs  Lab 06/24/22 0003 06/24/22 0718  GLUCAP 148* 161*    Coagulation Studies: No results for input(s): "LABPROT", "INR" in the last 72 hours.   Imaging   DG Chest Port 1 View  Result Date: 06/23/2022 CLINICAL DATA:  dyspnea EXAM: PORTABLE CHEST 1 VIEW COMPARISON:  Chest x-ray 06/03/2022, CT angiography chest 06/02/2022, chest x-ray 05/14/2022 FINDINGS: The heart and mediastinal contours are unchanged. Aortic calcification Persistent retrocardiac airspace opacity. No pulmonary edema. Persistent bilateral trace to small volume pleural effusions. No pneumothorax.  No acute osseous abnormality. Neural simulator leads overlying the thoracic spine. IMPRESSION: 1. Persistent retrocardiac airspace opacity. Persistent bilateral trace to small volume pleural effusions. 2.  Aortic Atherosclerosis (ICD10-I70.0). Electronically Signed   By: Iven Finn M.D.   On: 06/23/2022 18:47     Medications:     Current Medications:  amLODipine  5 mg Oral Daily   apixaban  5 mg Oral BID   atorvastatin  40 mg Oral Daily   carvedilol  12.5 mg Oral BID WC   clopidogrel  75 mg Oral Daily   dapagliflozin propanediol  10 mg Oral QAC breakfast   hydrALAZINE  100 mg Oral TID   insulin aspart  0-5 Units Subcutaneous QHS   insulin aspart  0-6 Units Subcutaneous TID WC   isosorbide mononitrate  120 mg Oral Daily   levothyroxine  25 mcg Oral QAC breakfast   pantoprazole  40 mg Oral QODAY   potassium chloride  20 mEq Oral Daily   sodium chloride flush  3 mL Intravenous Q12H    Infusions:     Patient Profile   81 y/o male  w/ h/o chronic diastolic heart failure w/ recent drop in LVEF, 20-25%, cath w/ mild-mod CAD, PAF on Eliquis, HTN, Hypothyroidism, multiple re-admits for a/c CHF in the last year, readmitted w/ a/c CHF.   Assessment/Plan    Acute on Chronic Combined Systolic and Diastolic Heart Failure  - Echo 9/23 EF 60-65%. Grade I DD.  - Echo 3/22 EF 50-55%, RV normal   - Echo 8/23 EF down 20-25%, RV ok, in the setting of recurrent Afib w/ RVR - LHC 9/23 w/ mild-mod nonobs CAD. RHC w/ normal filling pressures and output, CI 2.7  - Suspect drop in EF due to Afib +/- HTN. Multiple admits for a/c CHF in the last year in setting of hypertensive urgency and ? Poor compliance w/ meds. Also ? Possible infiltrative CM  - NYHA III. Remains fluid overload on exam.  - Continue IV Lasix 80 mg bid  - Place PICC to measure CVP and co-ox  - Hold Mearl Latin and Farxiga for now w/ AKI - Stop Imdur/ Hydral for now w/ hypotension.  - Continue Coreg 12.5 mg bid  - Consider cMRI/ PYP to assess for amyloid.  - Multiple readmits for a/c CHF in the last 6 months. We discussed CardioMEMs implant and he is interested in this.     2. HTN/ Hypertensive Urgency  - h/o poor control, several admits for hypertensive urgency>>a/c CHF w/ charted history of left RAS (followed by Dr. Gwenlyn Found and Dr. Trula Slade) - BP improved w/ NTG (now off)  - Hold Entresto/Spiro w/ AKI  - Stop Hydral and Imdur w/ soft BP  - Ok to continue Coreg    3. Persistent Afib  - Has been in persistent Afib since at least 8/23. V-rates variable, 90s-120s - Suspect contributing to recent drop in EF - Would benefit from attempt at TEE DCCV and AAD therapy w/ amiodarone for mantanence  - Continue Coreg 12.5 mg bid  - On Eliquis, given age and renal fx needs dose reduction to 2.5 mg bid    4. Hypothyroidism  - on levothyroxine   5. Hypokalemia - K 3.0, Mg ok 2.0  - Supp w/ KCL   6. CAD - mild-mod dz on recent LHC - continue medical therapy   7. AKI  on CKD IIIb - Scr 1.57 on admit, c/w b/l - SCr up to 1.85 today, ? Due to  hypotension after getting IV NTG - hold Entresto/spiro and Farxiga, Imdur/hydral for now - follow closely w/ diuresis  - avoid hypotension    Length of Stay: 1  Brittainy Simmons, PA-C  06/24/2022, 10:17 AM  Advanced Heart Failure Team Pager [phone] (M-F; 7a - 5p)  Please contact [facility] for night-coverage after hours (4p -7a ) and weekends on [email or web address]  Patient seen with PA, agree with the above note.   Patient has history as outlined above.  He was re-admitted with hypertensive emergency and CHF exacerbation.  He was in atrial fibrillation with mild RVR.   Initially diuresed with aggressive BP control, BP dropped and he ended up being given a fluid bolus. Currently, HR is controlled in atrial fibrillation.  SBP back in the 110s-120s range.   General: NAD Neck: JVP 10-12 cm, no thyromegaly or thyroid nodule.  Lungs: Clear to auscultation bilaterally with normal respiratory effort. CV: Nondisplaced PMI.  Heart irregular S1/S2, no S3/S4, no murmur.  No peripheral edema.  No carotid bruit.  Unable to palpate pedal pulses.  Abdomen: Soft, nontender, no hepatosplenomegaly, no distention.  Skin: Intact without lesions or rashes.  Neurologic: Alert and oriented x 3.  Psych: Normal affect. Extremities: No clubbing or cyanosis.  HEENT: Normal.   1. Acute on chronic systolic CHF: In setting of atrial fibrillation with mild RVR and initial uncontrolled HTN.  Echo in 8/23 showed EF 25-30% with normal RV, trivial MR, dilated IVC.  This was significantly lower than in the past (EF 50-55% in 3/23).  LHC in 9/23 showed nonobstructive CAD, unable to explain fall in EF. He was in atrial fibrillation in 8/23, ?tachy-mediated CMP.  Currently, SBP 90s-120s.  He has been getting IV Lasix.  Creatinine up to 1.86.  On exam, he does still appear at least mildly volume overloaded.  - Continue Lasix 80 mg IV bid today.  - With  rise in creatinine, would arrange for PICC line to follow RA pressure and co-ox to avoid over-diuresing.  - Continue Coreg 12.5 mg bid.  - Add back spironolactone 12.5 daily.  - Would stop hydralazine/Imdur for now, will aim to try to get him back on Entresto and Farxiga when creatinine stabilizes.  - Agree that Cardiomems would be a good option for him.  2. Atrial fibrillation: He appears to have been in atrial fibrillation (with RVR at times) since 8/23.  He has been on apixaban but we are unsure about his compliance with his home medication regimen.  - Continue apixaban 2.5 mg bid - Add amiodarone 400 mg bid.  - Will arrange for TEE-guided DCCV hopefully tomorrow.  - Need to maintain NSR, will consider ablation down the road.  3. HTN: Patient was admitted with hypertensive emergency/CHF.   He had aggressive initial BP control with transient hypotension.  Currently appears more stable.   - Slowly add back meds for BP control.  - I suspect that he is not compliant with his (very extensive) home anti-hypertensive regimen.  Paramedicine at discharge would be helpful.  4. CAD: H/o PCI x 2 to RCA.  Cath in 8/23 after EF found to be low on echo did not explain his cardiomyopathy.   - Continue statin.  - He is on apixaban so do not think he needs to continue Plavix with no recent PCI.  5. PAD: He has severe right SFA and right EIA stenosis by 12/21 peripheral angiography.  Follows with Dr. Gwenlyn Found. No rest pain or pedal ulcerations.  6. AKI on  CKD stage 3: Creatinine up to 1.86 from 1.57 today.  This may be due to hypotension overnight with aggressive anti-hypertensive regimen.  - Avoid hypotension.  - Needs more diuresis so will follow closely but guide by CVP from PICC.   Loralie Champagne 06/24/2022 1:01 PM

## 2022-06-24 NOTE — Progress Notes (Signed)
Heart Failure Navigator Progress Note  Assessed for Heart & Vascular TOC clinic readiness.  Patient will be followed by AHF clinic and Dr. Aundra Dubin as he was previously referred for cardiomems but canceled appointments. AHF rounding team consulted this hospitalization.   HF Navigation team will sign-off.     Pricilla Holm, MSN, RN Heart Failure Nurse Navigator

## 2022-06-24 NOTE — Progress Notes (Signed)
Spoke to RN regarding PICC placement and made aware PICC will be done 06/25/22.Patient has current PIV access.

## 2022-06-24 NOTE — ED Notes (Signed)
Pt getting restless and having a hard time finding a comfortable position. RN attempted to reposition for comfort and safety   Bedding replaced and pt given drink and snack

## 2022-06-24 NOTE — Progress Notes (Signed)
PROGRESS NOTE    Albert Powell  [id number] DOB: 1940/12/13 DOA: 06/23/2022 PCP: Leeroy Cha, MD  Chief Complaint  Patient presents with   Shortness of Breath    Brief Narrative:  Albert Powell is Varian Innes pleasant 81 y.o. male with medical history significant for hypertension, type 2 diabetes mellitus, CAD, atrial fibrillation on Eliquis, and chronic systolic CHF who presents to the emergency department with shortness of breath.   Patient reports 2 to 3 days of worsening shortness of breath but denies chest pain, cough, fever, or leg swelling.  He reports adherence with his medications including diuretics.   Currently being treated for [name] heart failure exacerbation.  Cardiology has been consulted.   Assessment & Plan:   Principal Problem:   Acute on chronic systolic CHF (congestive heart failure) ([name]) Active Problems:   Acquired hypothyroidism   Type 2 diabetes mellitus with stage 3b chronic kidney disease ([name])   Hypertensive crisis   PAF (paroxysmal atrial fibrillation) ([name])   Hypokalemia   Elevated troponin   Assessment and Plan: 2. Hypertensive crisis  Now with Hypotension   - BP as high as 209/100 in ED at presentation - had nitropaste placed in ED, then was started on nitro gtt - nitro gtt stopped overnight around 0330 AM, but still had nitropaste on when I saw him this morning  - BP this morning in the [id number] systolic -> remove nitropaste - small 250 cc bolus given for persistent hypotension with SBP in 80's - he already received his lasix, coreg, entresto, and norvasc this morning - Hold additional BP meds at this time (entresto and lasix d/c'd for now) - holding parameters for BP meds (coreg, amlodipine, imdur, hydral ordered to continue with holding parameters given his significant hypertension at presentation)  1. Acute on chronic systolic CHF  Shortness of Breath - Presents with progressive SOB despite adherence with diuretics at home  - BNP  elevated to > 3000 which is much higher than his previous values - troponins flat - CXR notable for retrocardiac opacity, bilateral trace to small volume effusions - EF was 25-30% on TTE from May 14, 2022  - hold further diuresis at this time given hypotension - follow d dimer  - will ask for cardiology's assistance - hold lasix, coreg, entresto, farxiga for hypotension (already had these meds today) -> resume cautiously given above (coreg, imdur, hydral, amlodipine currently ordered with holding parameters)   # CKD IIIb - creatinine appears to fluctuate between 1.4-1.8 - follow with diuresis   3. PAF  - Continue Eliquis and Coreg     4. Type II DM  - A1c was 7.2% in August 2023  - Continue Farxiga, check CBGs and use low-intensity SSI if needed     5. Hypothyroidism  - Continue Synthroid     6. Elevated troponin  - Troponin was 64 and then 66 in ED  - No chest pain, low-suspicion for ACS, likely secondary to acute CHF and severely elevated BP     7. Hypokalemia  - Replacing      DVT prophylaxis: eliquis Code Status: DNR Family Communication: none at bedside Disposition:   Status is: Inpatient Remains inpatient appropriate because: need for continued inpatient management   Consultants:  cardiology  Procedures:  none  Antimicrobials:  Anti-infectives (From admission, onward)    None       Subjective: C/o nausea/dizziness  Objective: Vitals:   06/24/22 0945 06/24/22 1000 06/24/22 1015 06/24/22 1030  BP: (!) 87/55 [name])  99/57 108/71 123/70  Pulse: 70 65 66 70  Resp: (!) 24 20 (!) 35 (!) 22  Temp:      TempSrc:      SpO2: 93% 93% 96% 95%  Weight:      Height:        Intake/Output Summary (Last 24 hours) at 06/24/2022 1042 Last data filed at 06/24/2022 0335 Gross per 24 hour  Intake 124 ml  Output 800 ml  Net -676 ml   Filed Weights   06/23/22 1753  Weight: 99.8 kg    Examination:  General exam: Appears calm and comfortable  Respiratory  system: Clear to auscultation bilaterally, diminished Cardiovascular system: RRR Gastrointestinal system: Abdomen is nondistended, soft and nontender. Central nervous system: Alert and oriented. No focal neurological deficits. Extremities: no significant LE edema or dependent edema  Data Reviewed: I have personally reviewed following labs and imaging studies  CBC: Recent Labs  Lab 06/23/22 1752 06/24/22 0358  WBC 9.5 8.4  NEUTROABS 7.3  --   HGB 14.0 13.3  HCT 42.4 39.0  MCV 91.6 88.8  PLT 307 311    Basic Metabolic Panel: Recent Labs  Lab 06/23/22 1752 06/24/22 0358  NA 137 138  K 3.0* 3.0*  CL 98 99  CO2 26 24  GLUCOSE 182* 143*  BUN 15 15  CREATININE 1.57* 1.86*  CALCIUM 8.8* 8.7*  MG 2.1 2.0    GFR: Estimated Creatinine Clearance: 36.9 mL/min ([place]) (by C-G formula based on SCr of 1.86 mg/dL (H)).  Liver Function Tests: Recent Labs  Lab 06/23/22 1752  AST 23  ALT 34  ALKPHOS 73  BILITOT 0.7  PROT 6.5  ALBUMIN 3.3*    CBG: Recent Labs  Lab 06/24/22 0003 06/24/22 0718  GLUCAP 148* 161*     No results found for this or any previous visit (from the past 240 hour(s)).       Radiology Studies: DG Chest Port 1 View  Result Date: 06/23/2022 CLINICAL DATA:  dyspnea EXAM: PORTABLE CHEST 1 VIEW COMPARISON:  Chest x-ray 06/03/2022, CT angiography chest 06/02/2022, chest x-ray 05/14/2022 FINDINGS: The heart and mediastinal contours are unchanged. Aortic calcification Persistent retrocardiac airspace opacity. No pulmonary edema. Persistent bilateral trace to small volume pleural effusions. No pneumothorax. No acute osseous abnormality. Neural simulator leads overlying the thoracic spine. IMPRESSION: 1. Persistent retrocardiac airspace opacity. Persistent bilateral trace to small volume pleural effusions. 2.  Aortic Atherosclerosis (ICD10-I70.0). Electronically Signed   By: Tish Frederickson M.D.   On: 06/23/2022 18:47        Scheduled Meds:  amLODipine  5  mg Oral Daily   apixaban  5 mg Oral BID   atorvastatin  40 mg Oral Daily   carvedilol  12.5 mg Oral BID WC   clopidogrel  75 mg Oral Daily   dapagliflozin propanediol  10 mg Oral QAC breakfast   hydrALAZINE  100 mg Oral TID   insulin aspart  0-5 Units Subcutaneous QHS   insulin aspart  0-6 Units Subcutaneous TID WC   isosorbide mononitrate  120 mg Oral Daily   levothyroxine  25 mcg Oral QAC breakfast   pantoprazole  40 mg Oral QODAY   potassium chloride  20 mEq Oral Daily   sodium chloride flush  3 mL Intravenous Q12H   Continuous Infusions:   LOS: 1 day    Time spent: over 30 min    Lacretia Nicks, MD [facility]   To contact the attending provider between 7A-7P or the covering  provider during after hours 7P-7A, please log into the web site [email or web address] and access using universal [place] password for that web site. If you do not have the password, please call the hospital operator.  06/24/2022, 10:42 AM

## 2022-06-24 NOTE — ED Notes (Signed)
BP noted to be low, Nitro drip paused

## 2022-06-25 ENCOUNTER — Encounter (HOSPITAL_COMMUNITY)
Admission: EM | Disposition: A | Discharge status: Home Health | Payer: Self-pay | Source: Home / Self Care | Attending: Internal Medicine

## 2022-06-25 DIAGNOSIS — I5023 Acute on chronic systolic (congestive) heart failure: Secondary | ICD-10-CM | POA: Diagnosis not present

## 2022-06-25 DIAGNOSIS — N179 Acute kidney failure, unspecified: Secondary | ICD-10-CM | POA: Diagnosis not present

## 2022-06-25 DIAGNOSIS — N183 Chronic kidney disease, stage 3 unspecified: Secondary | ICD-10-CM | POA: Diagnosis not present

## 2022-06-25 LAB — CBC
HCT: 41.1 % (ref 39.0–52.0)
Hemoglobin: 13.8 g/dL (ref 13.0–17.0)
MCH: 30.2 pg (ref 26.0–34.0)
MCHC: 33.6 g/dL (ref 30.0–36.0)
MCV: 89.9 fL (ref 80.0–100.0)
Platelets: 281 10*3/uL (ref 150–400)
RBC: 4.57 MIL/uL (ref 4.22–5.81)
RDW: 13.6 % (ref 11.5–15.5)
WBC: 10.9 10*3/uL — ABNORMAL HIGH (ref 4.0–10.5)
nRBC: 0.2 % (ref 0.0–0.2)

## 2022-06-25 LAB — GLUCOSE, CAPILLARY
Glucose-Capillary: 110 mg/dL — ABNORMAL HIGH (ref 70–99)
Glucose-Capillary: 249 mg/dL — ABNORMAL HIGH (ref 70–99)
Glucose-Capillary: 105 mg/dL — ABNORMAL HIGH (ref 70–99)
Glucose-Capillary: 153 mg/dL — ABNORMAL HIGH (ref 70–99)

## 2022-06-25 LAB — COOXEMETRY PANEL
Carboxyhemoglobin: 0.4 % — ABNORMAL LOW (ref 0.5–1.5)
Methemoglobin: <0.7 % (ref 0.0–1.5)
O2 Saturation: 59.9 %
Total hemoglobin: 11.9 g/dL — ABNORMAL LOW (ref 12.0–16.0)

## 2022-06-25 LAB — BASIC METABOLIC PANEL
Anion gap: 13 (ref 5–15)
BUN: 28 mg/dL — ABNORMAL HIGH (ref 8–23)
CO2: 20 mmol/L — ABNORMAL LOW (ref 22–32)
Calcium: 8.6 mg/dL — ABNORMAL LOW (ref 8.9–10.3)
Chloride: 105 mmol/L (ref 98–111)
Creatinine, Ser: 2.32 mg/dL — ABNORMAL HIGH (ref 0.61–1.24)
GFR, Estimated: 28 mL/min — ABNORMAL LOW (ref >60)
Glucose, Bld: 115 mg/dL — ABNORMAL HIGH (ref 70–99)
Potassium: 4.2 mmol/L (ref 3.5–5.1)
Sodium: 138 mmol/L (ref 135–145)

## 2022-06-25 LAB — MAGNESIUM: Magnesium: 2.1 mg/dL (ref 1.7–2.4)

## 2022-06-25 SURGERY — ECHOCARDIOGRAM, TRANSESOPHAGEAL
Anesthesia: General

## 2022-06-25 MED ORDER — SODIUM CHLORIDE 0.9% FLUSH: 10.0000 mL | INTRAVENOUS | Status: DC | PRN

## 2022-06-25 MED ORDER — CHLORHEXIDINE GLUCONATE CLOTH 2 % EX PADS
6.0000 | MEDICATED_PAD | Freq: Every day | CUTANEOUS | Status: DC
  Administered 2022-06-25 – 2022-07-03 (×9): 6 via TOPICAL

## 2022-06-25 MED ORDER — SODIUM CHLORIDE 0.9% FLUSH
10.0000 mL | Freq: Two times a day (BID) | INTRAVENOUS | Status: DC
  Administered 2022-06-25 – 2022-06-30 (×9): 10 mL
  Administered 2022-06-30 – 2022-07-01 (×2): 30 mL
  Administered 2022-07-01 – 2022-07-03 (×3): 10 mL

## 2022-06-25 MED ORDER — AMIODARONE HCL 200 MG PO TABS
400.0000 mg | ORAL_TABLET | Freq: Two times a day (BID) | ORAL | Status: DC
  Administered 2022-06-25 – 2022-07-01 (×13): 400 mg via ORAL
  Filled 2022-06-25 (×14): qty 2

## 2022-06-25 NOTE — Plan of Care (Signed)
°  Problem: Education: °Goal: Ability to demonstrate management of disease process will improve °Outcome: Progressing °  °Problem: Education: °Goal: Ability to verbalize understanding of medication therapies will improve °Outcome: Progressing °  °Problem: Activity: °Goal: Capacity to carry out activities will improve °Outcome: Progressing °  °

## 2022-06-25 NOTE — Progress Notes (Addendum)
Advanced Heart Failure Rounding Note  PCP-Cardiologist: Quay Burow, MD   Subjective:    Received IV Lasix, 80 mg x 2 yesterday. 500 cc in UOP charted. Suspect I/Os incomplete. Spent most of the day yesterday in the ED.   W/ AKI, SCr 1.57>>1.86>>2.32  K 4.2   SBPs overnight low 100s-120s  Awaiting PICC.   Patient is in [place] w/ PACs on tele. HR 70s.   Denies current dyspnea. No supp O2 requirements. Feels better.    Objective:   Weight Range: 97.1 kg Body mass index is 31.6 kg/m.   Vital Signs:   Temp:  [97.8 F (36.6 C)-98.9 F (37.2 C)] 97.8 F (36.6 C) (10/11 0415) Pulse Rate:  [53-101] 56 (10/11 0415) Resp:  [14-38] 18 (10/11 0415) BP: (77-135)/(44-86) 128/59 (10/11 0415) SpO2:  [82 %-98 %] 96 % (10/11 0415) Weight:  [96.7 kg-97.1 kg] 97.1 kg (10/11 0135) Last BM Date : 06/24/22  Weight change: Filed Weights   06/23/22 1753 06/24/22 1653 06/25/22 0135  Weight: 99.8 kg 96.7 kg 97.1 kg    Intake/Output:   Intake/Output Summary (Last 24 hours) at 06/25/2022 0717 Last data filed at 06/25/2022 0231 Gross per 24 hour  Intake 700 ml  Output 500 ml  Net 200 ml      Physical Exam    General:  elderly male, mod obese. No resp difficulty HEENT: Normal Neck: Supple. JVP 7-8 cm . Carotids 2+ bilat; no bruits. No lymphadenopathy or thyromegaly appreciated. Cor: PMI nondisplaced. Irregular rhythm. No rubs, gallops or murmurs. Lungs: Clear Abdomen: obese, soft, nontender, nondistended. No hepatosplenomegaly. No bruits or masses. Good bowel sounds. Extremities: No cyanosis, clubbing, rash, edema Neuro: Alert & orientedx3, cranial nerves grossly intact. moves all 4 extremities w/o difficulty. Affect pleasant   Telemetry   Sinus w/ PACs 70s   EKG    Atrial fibrillation 71 bpm   Labs    CBC Recent Labs    06/23/22 1752 06/24/22 0358 06/25/22 0524  WBC 9.5 8.4 10.9*  NEUTROABS 7.3  --   --   HGB 14.0 13.3 13.8  HCT 42.4 39.0 41.1  MCV 91.6  88.8 89.9  PLT 307 311 416   Basic Metabolic Panel Recent Labs    06/24/22 0358 06/25/22 0524  NA 138 138  K 3.0* 4.2  CL 99 105  CO2 24 20*  GLUCOSE 143* 115*  BUN 15 28*  CREATININE 1.86* 2.32*  CALCIUM 8.7* 8.6*  MG 2.0 2.1   Liver Function Tests Recent Labs    06/23/22 1752  AST 23  ALT 34  ALKPHOS 73  BILITOT 0.7  PROT 6.5  ALBUMIN 3.3*   No results for input(s): "LIPASE", "AMYLASE" in the last 72 hours. Cardiac Enzymes No results for input(s): "CKTOTAL", "CKMB", "CKMBINDEX", "TROPONINI" in the last 72 hours.  BNP: BNP (last 3 results) Recent Labs    06/06/22 0206 06/23/22 1752 06/24/22 0358  BNP 196.2* 3,165.5* 3,698.8*    ProBNP (last 3 results) No results for input(s): "PROBNP" in the last 8760 hours.   D-Dimer Recent Labs    06/24/22 1709  DDIMER 0.86*   Hemoglobin A1C No results for input(s): "HGBA1C" in the last 72 hours. Fasting Lipid Panel No results for input(s): "CHOL", "HDL", "LDLCALC", "TRIG", "CHOLHDL", "LDLDIRECT" in the last 72 hours. Thyroid Function Tests No results for input(s): "TSH", "T4TOTAL", "T3FREE", "THYROIDAB" in the last 72 hours.  Invalid input(s): "FREET3"  Other results:   Imaging    [place] EKG SITE  RITE  Result Date: 06/24/2022 If Site Rite image not attached, placement could not be confirmed due to current cardiac rhythm.    Medications:     Scheduled Medications:  amiodarone  400 mg Oral BID   apixaban  2.5 mg Oral BID   atorvastatin  40 mg Oral Daily   carvedilol  12.5 mg Oral BID WC   insulin aspart  0-5 Units Subcutaneous QHS   insulin aspart  0-6 Units Subcutaneous TID WC   levothyroxine  25 mcg Oral QAC breakfast   pantoprazole  40 mg Oral QODAY   potassium chloride  20 mEq Oral Daily   sodium chloride flush  3 mL Intravenous Q12H   spironolactone  12.5 mg Oral Daily    Infusions:  sodium chloride 20 mL/hr at 06/24/22 1707    PRN Medications: acetaminophen **OR** acetaminophen,  ondansetron **OR** [DISCONTINUED] ondansetron (ZOFRAN) IV, senna-docusate    Patient Profile   81 y/o male w/ h/o chronic diastolic heart failure w/ recent drop in LVEF, 20-25%, cath w/ mild-mod CAD, PAF on Eliquis, HTN, Hypothyroidism, multiple re-admits for a/c CHF in the last year, readmitted w/ a/c CHF in setting of persistent atrial fibrillation.   Assessment/Plan   1. Acute on chronic systolic CHF: In setting of atrial fibrillation with mild RVR and initial uncontrolled HTN.  Echo in 8/23 showed EF 25-30% with normal RV, trivial MR, dilated IVC.  This was significantly lower than in the past (EF 50-55% in 3/23).  LHC in 9/23 showed nonobstructive CAD, unable to explain fall in EF. He was in atrial fibrillation in 8/23, ?tachy-mediated CMP.  Currently, SBP 90s-120s.  He has been getting IV Lasix. Now w/ AKI, Creatinine 1.57>>1.86>>2.32. Not fluid overloaded on exam.  - Stop Lasix and hold Spironolactone for now w/ AKI  - Place PICC to check co-ox and measure CVP  - Continue Coreg 12.5 mg bid.  - Discontinued hydralazine/Imdur for now, will aim to try to get him back on Entresto and Farxiga when creatinine stabilizes.  - Agree that Cardiomems would be a good option for him.  2. Atrial fibrillation: He appears to have been in atrial fibrillation (with RVR at times) since 8/23.  He has been on apixaban but we are unsure about his compliance with his home medication regimen.  - Continue apixaban 2.5 mg bid - Added amiodarone 400 mg bid.  - on for TEE-guided DCCV today - Need to maintain NSR, will consider ablation down the road.  3. HTN: Patient was admitted with hypertensive emergency/CHF.   He had aggressive initial BP control with transient hypotension.  Currently appears more stable.   - Slowly add back meds for BP control.  - I suspect that he is not compliant with his (very extensive) home anti-hypertensive regimen.  Paramedicine at discharge would be helpful.  4. CAD: H/o PCI x 2 to  RCA.  Cath in 8/23 after EF found to be low on echo did not explain his cardiomyopathy.   - Continue statin.  - He is on apixaban so do not think he needs to continue Plavix with no recent PCI.  5. PAD: He has severe right SFA and right EIA stenosis by 12/21 peripheral angiography.  Follows with Dr. Gwenlyn Found. No rest pain or pedal ulcerations.  6. AKI on CKD stage 3: Creatinine up to 2.32 from 1.57 on admit.  This may be due to hypotension yesterday with aggressive anti-hypertensive regimen +/- possible over-diuresis. Also need to r/o low output - Hold Lasix  and spironolactone today. Hold scheduled KCl  - Avoid hypotension.  - Place PICC to measure CVP and Co-ox    Length of Stay: 2  Lyda Jester, PA-C  06/25/2022, 7:17 AM  Patient seen with PA, agree with the above note.   He is in NSR with PACs this morning on amiodarone.  Creatinine up to 2.32 with diuresis.  Of note, he had been hypotensive earlier in hospital stay with aggressive anti-hypertensive regimen.   No complaints this morning, denies dyspnea at rest.   General: NAD Neck: JVP 8 cm, no thyromegaly or thyroid nodule.  Lungs: Clear to auscultation bilaterally with normal respiratory effort. CV: Nondisplaced PMI.  Heart regular S1/S2, no S3/S4, no murmur.  No peripheral edema.   Abdomen: Soft, nontender, no hepatosplenomegaly, no distention.  Skin: Intact without lesions or rashes.  Neurologic: Alert and oriented x 3.  Psych: Normal affect. Extremities: No clubbing or cyanosis.  HEENT: Normal.   Patient is in NSR with PACs.  Can cancel TEE-guided DCCV.  Need to maintain NSR with some concern for tachy-mediated CMP.  - Continue amiodarone 400 mg bid.  - Continue Eliquis.   AKI on CKD3 in setting of diuresis + over-correction of BP.  May be a component of ATN.  Will need to follow closely.  BP currently stable.  - No diuretic today.  - Hold spironolactone.  - He has been off [place].  - Can continue Coreg.   - Place PICC today, check CVP and co-ox.   Long-term, strong concern about compliance with meds at home.  Will need paramedicine.   Loralie Champagne 06/25/2022 8:10 AM  Advanced Heart Failure Team Pager [phone] (M-F; 7a - 5p)  Please contact [facility] for night-coverage after hours (5p -7a ) and weekends on [email or web address]

## 2022-06-25 NOTE — Progress Notes (Signed)
PROGRESS NOTE    Albert Powell  SAY:[id number] DOB: [birth year] DOA: 06/23/2022 PCP: Leeroy Cha, MD   81 y/o male w/ h/o chronic diastolic heart failure w/ recent drop in LVEF, 20-25%, cath w/ mild-mod CAD, PAF on Eliquis, HTN, Hypothyroidism, multiple patients with CHF, readmitted w/ a/c CHF in setting of persistent atrial fibrillation.  Improving on diuretics, advanced heart failure team following  Subjective: Feels better today, breathing is improving, n.p.o.  Assessment and Plan:  Acute on chronic systolic CHF: -Recent echo 8/23 noted EF of 20-25% with normal RV function, considerably lower compared to echo earlier this year with a EF of 50-55% -Left heart cath in 9/23 noted nonobstructive CAD, A-fib was suspected to be the cause of his cardiomyopathy -Advanced heart failure team following, diuresis intermittently limited by hypotension -Now diuretics on hold, hydralazine and Imdur discontinued as well -Continue Coreg -converted to sinus rhythm today -CardioMEMS being considered  Paroxysmal A-fib -Felt to be the cause of his recent cardiomyopathy -Now on p.o. amiodarone, continue apixaban -Plan for cardioversion canceled, now in sinus rhythm  AKI on CKD 3a -Creatinine up to 2.3 from 1.5 this admission -Likely cardiorenal, hypotension likely contributing as well -Avoid hypotension, diuretics on hold today, heart failure team following, plan to place PICC line and measure CVP  CAD -History of PCI to RCA in the past, cath in 9/23 noted nonobstructive CAD -Continue Coreg and statin   PAD:  -Continue statin and Eliquis  Type 2 diabetes mellitus -Hemoglobin A1c was 7.2 in August,, CBGs are stable, Farxiga on hold  Hypothyroidism -Continue Synthroid  Hypokalemia -Replaced   DVT prophylaxis: Apixaban Code Status: DNR Family Communication:d/w pt,  no family at bedside Disposition Plan: Home likely 2 to 3 days  Consultants: CHF team   Procedures:    Antimicrobials:    Objective: Vitals:   06/25/22 0135 06/25/22 0415 06/25/22 0743 06/25/22 1124  BP:  (!) 128/59 132/68 95/84  Pulse:  (!) 56 63 60  Resp:  18 17 18   Temp:  97.8 F (36.6 C) 98 F (36.7 C) 97.9 F (36.6 C)  TempSrc:  Oral Oral Oral  SpO2:  96% 93% 98%  Weight: 97.1 kg     Height:        Intake/Output Summary (Last 24 hours) at 06/25/2022 1131 Last data filed at 06/25/2022 1124 Gross per 24 hour  Intake 1060 ml  Output 500 ml  Net 560 ml   Filed Weights   06/23/22 1753 06/24/22 1653 06/25/22 0135  Weight: 99.8 kg 96.7 kg 97.1 kg    Examination:  General exam: Appears calm and comfortable  HEENT: Neck obese, JVD appears mildly elevated Respiratory system: Clear to auscultation Cardiovascular system: S1 & S2 heard, RRR.  Abd: nondistended, soft and nontender.Normal bowel sounds heard. Central nervous system: Alert and oriented. No focal neurological deficits. Extremities: no edema Skin: No rashes Psychiatry:  Mood & affect appropriate.     Data Reviewed:   CBC: Recent Labs  Lab 06/23/22 1752 06/24/22 0358 06/25/22 0524  WBC 9.5 8.4 10.9*  NEUTROABS 7.3  --   --   HGB 14.0 13.3 13.8  HCT 42.4 39.0 41.1  MCV 91.6 88.8 89.9  PLT 307 311 235   Basic Metabolic Panel: Recent Labs  Lab 06/23/22 1752 06/24/22 0358 06/25/22 0524  NA 137 138 138  K 3.0* 3.0* 4.2  CL 98 99 105  CO2 26 24 20*  GLUCOSE 182* 143* 115*  BUN 15 15 28*  CREATININE 1.57*  1.86* 2.32*  CALCIUM 8.8* 8.7* 8.6*  MG 2.1 2.0 2.1   GFR: Estimated Creatinine Clearance: 29.2 mL/min (A) (by C-G formula based on SCr of 2.32 mg/dL (H)). Liver Function Tests: Recent Labs  Lab 06/23/22 1752  AST 23  ALT 34  ALKPHOS 73  BILITOT 0.7  PROT 6.5  ALBUMIN 3.3*   No results for input(s): "LIPASE", "AMYLASE" in the last 168 hours. No results for input(s): "AMMONIA" in the last 168 hours. Coagulation Profile: Recent Labs  Lab 06/24/22 1709  INR 1.4*   Cardiac  Enzymes: No results for input(s): "CKTOTAL", "CKMB", "CKMBINDEX", "TROPONINI" in the last 168 hours. BNP (last 3 results) No results for input(s): "PROBNP" in the last 8760 hours. HbA1C: No results for input(s): "HGBA1C" in the last 72 hours. CBG: Recent Labs  Lab 06/24/22 0718 06/24/22 1123 06/24/22 1609 06/24/22 2112 06/25/22 0611  GLUCAP 161* 179* 214* 131* 110*   Lipid Profile: No results for input(s): "CHOL", "HDL", "LDLCALC", "TRIG", "CHOLHDL", "LDLDIRECT" in the last 72 hours. Thyroid Function Tests: No results for input(s): "TSH", "T4TOTAL", "FREET4", "T3FREE", "THYROIDAB" in the last 72 hours. Anemia Panel: No results for input(s): "VITAMINB12", "FOLATE", "FERRITIN", "TIBC", "IRON", "RETICCTPCT" in the last 72 hours. Urine analysis:    Component Value Date/Time   COLORURINE YELLOW 01/01/2021 1351   APPEARANCEUR CLEAR 01/01/2021 1351   LABSPEC 1.009 01/01/2021 1351   PHURINE 6.0 01/01/2021 1351   GLUCOSEU >=500 (A) 01/01/2021 1351   HGBUR SMALL (A) 01/01/2021 1351   BILIRUBINUR NEGATIVE 01/01/2021 1351   KETONESUR NEGATIVE 01/01/2021 1351   PROTEINUR >=300 (A) 01/01/2021 1351   UROBILINOGEN 1.0 05/31/2013 1432   NITRITE NEGATIVE 01/01/2021 1351   LEUKOCYTESUR NEGATIVE 01/01/2021 1351   Sepsis Labs: @LABRCNTIP (procalcitonin:4,lacticidven:4)  )No results found for this or any previous visit (from the past 240 hour(s)).   Radiology Studies: EKG SITE RITE  Result Date: 06/24/2022 If Site Rite image not attached, placement could not be confirmed due to current cardiac rhythm.  DG Chest Port 1 View  Result Date: 06/23/2022 CLINICAL DATA:  dyspnea EXAM: PORTABLE CHEST 1 VIEW COMPARISON:  Chest x-ray 06/03/2022, CT angiography chest 06/02/2022, chest x-ray 05/14/2022 FINDINGS: The heart and mediastinal contours are unchanged. Aortic calcification Persistent retrocardiac airspace opacity. No pulmonary edema. Persistent bilateral trace to small volume pleural  effusions. No pneumothorax. No acute osseous abnormality. Neural simulator leads overlying the thoracic spine. IMPRESSION: 1. Persistent retrocardiac airspace opacity. Persistent bilateral trace to small volume pleural effusions. 2.  Aortic Atherosclerosis (ICD10-I70.0). Electronically Signed   By: 05/16/2022 M.D.   On: 06/23/2022 18:47     Scheduled Meds:  amiodarone  400 mg Oral BID   apixaban  2.5 mg Oral BID   atorvastatin  40 mg Oral Daily   carvedilol  12.5 mg Oral BID WC   Chlorhexidine Gluconate Cloth  6 each Topical Daily   insulin aspart  0-5 Units Subcutaneous QHS   insulin aspart  0-6 Units Subcutaneous TID WC   levothyroxine  25 mcg Oral QAC breakfast   pantoprazole  40 mg Oral QODAY   sodium chloride flush  10-40 mL Intracatheter Q12H   sodium chloride flush  3 mL Intravenous Q12H   Continuous Infusions:  sodium chloride 20 mL/hr at 06/24/22 1707     LOS: 2 days    Time spent: 08/24/22    , MD [facility]   06/25/2022, 11:31 AM

## 2022-06-25 NOTE — Progress Notes (Signed)
Peripherally Inserted Central Catheter Placement  The IV Nurse has discussed with the patient and/or persons authorized to consent for the patient, the purpose of this procedure and the potential benefits and risks involved with this procedure.  The benefits include less needle sticks, lab draws from the catheter, and the patient may be discharged home with the catheter. Risks include, but not limited to, infection, bleeding, blood clot (thrombus formation), and puncture of an artery; nerve damage and irregular heartbeat and possibility to perform a PICC exchange if needed/ordered by physician.  Alternatives to this procedure were also discussed.  Bard Power PICC patient education guide, fact sheet on infection prevention and patient information card has been provided to patient /or left at bedside.    PICC Placement Documentation  PICC Double Lumen 06/25/22 Right Brachial 42 cm 0 cm (Active)  Indication for Insertion or Continuance of Line Vasoactive infusions 06/25/22 0835  Exposed Catheter (cm) 0 cm 06/25/22 0835  Site Assessment Clean, Dry, Intact 06/25/22 0835  Lumen #1 Status Flushed;Saline locked;Blood return noted 06/25/22 0835  Lumen #2 Status Flushed;Saline locked;Blood return noted 06/25/22 0835  Dressing Type Transparent;Securing device 06/25/22 0835  Dressing Status Antimicrobial disc in place;Clean, Dry, Intact 06/25/22 0835  Safety Lock Not Applicable 07/86/75 4492  Line Care Connections checked and tightened 06/25/22 0835  Line Adjustment (NICU/IV Team Only) No 06/25/22 0835  Dressing Intervention New dressing;Other (Comment) 06/25/22 0835  Dressing Change Due 07/02/22 06/25/22 Seymour, Koree Schopf 06/25/2022, 8:38 AM

## 2022-06-25 NOTE — Progress Notes (Addendum)
CVP 2-3 on personal check, no dizziness  No further diuresis today  Earnie Larsson, AGACNP-BC  06/25/22 10:27 AM   Agree with above.  Co-ox 60%.   Loralie Champagne 06/25/2022

## 2022-06-26 ENCOUNTER — Encounter (HOSPITAL_COMMUNITY): Payer: Medicare Other

## 2022-06-26 ENCOUNTER — Inpatient Hospital Stay (HOSPITAL_COMMUNITY): Payer: Medicare Other

## 2022-06-26 DIAGNOSIS — I5023 Acute on chronic systolic (congestive) heart failure: Secondary | ICD-10-CM | POA: Diagnosis not present

## 2022-06-26 LAB — CBC
HCT: 37.3 % — ABNORMAL LOW (ref 39.0–52.0)
Hemoglobin: 12.7 g/dL — ABNORMAL LOW (ref 13.0–17.0)
MCH: 30.3 pg (ref 26.0–34.0)
MCHC: 34 g/dL (ref 30.0–36.0)
MCV: 89 fL (ref 80.0–100.0)
Platelets: 283 10*3/uL (ref 150–400)
RBC: 4.19 MIL/uL — ABNORMAL LOW (ref 4.22–5.81)
RDW: 13.4 % (ref 11.5–15.5)
WBC: 10.5 10*3/uL (ref 4.0–10.5)
nRBC: 0 % (ref 0.0–0.2)

## 2022-06-26 LAB — BASIC METABOLIC PANEL
Anion gap: 11 (ref 5–15)
BUN: 28 mg/dL — ABNORMAL HIGH (ref 8–23)
CO2: 24 mmol/L (ref 22–32)
Calcium: 8.5 mg/dL — ABNORMAL LOW (ref 8.9–10.3)
Chloride: 100 mmol/L (ref 98–111)
Creatinine, Ser: 2.43 mg/dL — ABNORMAL HIGH (ref 0.61–1.24)
GFR, Estimated: 26 mL/min — ABNORMAL LOW (ref >60)
Glucose, Bld: 150 mg/dL — ABNORMAL HIGH (ref 70–99)
Potassium: 3.3 mmol/L — ABNORMAL LOW (ref 3.5–5.1)
Sodium: 135 mmol/L (ref 135–145)

## 2022-06-26 LAB — GLUCOSE, CAPILLARY
Glucose-Capillary: 121 mg/dL — ABNORMAL HIGH (ref 70–99)
Glucose-Capillary: 196 mg/dL — ABNORMAL HIGH (ref 70–99)
Glucose-Capillary: 222 mg/dL — ABNORMAL HIGH (ref 70–99)
Glucose-Capillary: 165 mg/dL — ABNORMAL HIGH (ref 70–99)

## 2022-06-26 LAB — COOXEMETRY PANEL
Carboxyhemoglobin: 1.3 % (ref 0.5–1.5)
Carboxyhemoglobin: 1.6 % — ABNORMAL HIGH (ref 0.5–1.5)
Carboxyhemoglobin: 0.5 % (ref 0.5–1.5)
Methemoglobin: <0.7 % (ref 0.0–1.5)
Methemoglobin: 0.7 % (ref 0.0–1.5)
Methemoglobin: <0.7 % (ref 0.0–1.5)
O2 Saturation: 53.2 %
O2 Saturation: 64.9 %
O2 Saturation: 37.9 %
Total hemoglobin: 13 g/dL (ref 12.0–16.0)
Total hemoglobin: 12.8 g/dL (ref 12.0–16.0)
Total hemoglobin: 12.7 g/dL (ref 12.0–16.0)

## 2022-06-26 LAB — MAGNESIUM: Magnesium: 1.9 mg/dL (ref 1.7–2.4)

## 2022-06-26 MED ORDER — IPRATROPIUM-ALBUTEROL 0.5-2.5 (3) MG/3ML IN SOLN
3.0000 mL | Freq: Once | RESPIRATORY_TRACT | Status: AC
  Administered 2022-06-26: 3 mL via RESPIRATORY_TRACT
  Filled 2022-06-26: qty 3

## 2022-06-26 MED ORDER — MAGNESIUM SULFATE IN D5W 1-5 GM/100ML-% IV SOLN
1.0000 g | Freq: Once | INTRAVENOUS | Status: AC
  Administered 2022-06-26: 1 g via INTRAVENOUS
  Filled 2022-06-26: qty 100

## 2022-06-26 MED ORDER — POTASSIUM CHLORIDE CRYS ER 20 MEQ PO TBCR
40.0000 meq | EXTENDED_RELEASE_TABLET | Freq: Once | ORAL | Status: AC
  Administered 2022-06-26: 40 meq via ORAL
  Filled 2022-06-26: qty 2

## 2022-06-26 MED ORDER — HYDROCORTISONE (PERIANAL) 2.5 % EX CREA: TOPICAL_CREAM | CUTANEOUS | Status: DC | PRN

## 2022-06-26 MED ORDER — HYDRALAZINE HCL 10 MG PO TABS
10.0000 mg | ORAL_TABLET | Freq: Three times a day (TID) | ORAL | Status: DC
  Administered 2022-06-26 – 2022-06-27 (×4): 10 mg via ORAL
  Filled 2022-06-26 (×4): qty 1

## 2022-06-26 MED ORDER — IPRATROPIUM-ALBUTEROL 0.5-2.5 (3) MG/3ML IN SOLN
3.0000 mL | Freq: Four times a day (QID) | RESPIRATORY_TRACT | Status: DC | PRN
  Administered 2022-06-26 – 2022-07-01 (×6): 3 mL via RESPIRATORY_TRACT
  Filled 2022-06-26 (×6): qty 3

## 2022-06-26 NOTE — Evaluation (Signed)
Occupational Therapy Evaluation/Discharge Patient Details Name: Albert Powell MRN: [id number] DOB: [birth year] Today's Date: 06/26/2022   History of Present Illness Pt is an 81 y/o M admitted on 06/23/22 for treatment of acute on chronic systolic CHF. PMH: chronic diastolic heart failure with recent drop in LVEF 20-25%, cath with mild-mod CAD, PAF on eliquis, HTN, hypothyroidism   Clinical Impression   PTA, pt lives with spouse, typically ambulatory without AD in the house but will use Rollator in the community. Pt reports Modified Independence with ADLs, household IADLs and driving. Pt presents now at baseline ADLs and short distance mobility. Pt able to demo in room mobility, tub transfer and skills for LB ADLs w/o physical assistance or safety concerns. Pt appeared mildly winded after activities but denied any SOB. Pt denies any concerns with managing daily routine with no further skilled OT services needed at this time.      Recommendations for follow up therapy are one component of a multi-disciplinary discharge planning process, led by the attending physician.  Recommendations may be updated based on patient status, additional functional criteria and insurance authorization.   Follow Up Recommendations  No OT follow up    Assistance Recommended at Discharge PRN  Patient can return home with the following      Functional Status Assessment  Patient has not had a recent decline in their functional status  Equipment Recommendations  None recommended by OT    Recommendations for Other Services       Precautions / Restrictions Precautions Precautions: Fall Restrictions Weight Bearing Restrictions: No      Mobility Bed Mobility Overal bed mobility: Modified Independent                  Transfers Overall transfer level: Modified independent Equipment used: None Transfers: Sit to/from Stand Sit to Stand: Modified independent (Device/Increase time)                   Balance Overall balance assessment: Needs assistance Sitting-balance support: Feet supported Sitting balance-Leahy Scale: Good     Standing balance support: During functional activity, No upper extremity supported Standing balance-Leahy Scale: Fair                             ADL either performed or assessed with clinical judgement   ADL Overall ADL's : At baseline                                       General ADL Comments: Pt able to demo tub transfer simulation in room w/o issue, ability to reach feet for LB dressing and denies any SOB or concerns with daily routine.     Vision Ability to See in Adequate Light: 0 Adequate Patient Visual Report: No change from baseline Vision Assessment?: No apparent visual deficits     Perception     Praxis      Pertinent Vitals/Pain Pain Assessment Pain Assessment: No/denies pain     Hand Dominance Right   Extremity/Trunk Assessment Upper Extremity Assessment Upper Extremity Assessment: Overall WFL for tasks assessed   Lower Extremity Assessment Lower Extremity Assessment: Defer to PT evaluation   Cervical / Trunk Assessment Cervical / Trunk Assessment: Normal   Communication Communication Communication: No difficulties   Cognition Arousal/Alertness: Awake/alert Behavior During Therapy: WFL for tasks assessed/performed Overall Cognitive Status: Within Functional Limits  for tasks assessed                                 General Comments: flat affect but pleasant     General Comments  HR 67-76 bpm, SpO2 81% after gait but question accuracy as not great pleth waveform, pt denied SOB & SPO2 quickly increased to 91% or >    Exercises     Shoulder Instructions      Home Living Family/patient expects to be discharged to:: Private residence Living Arrangements: Spouse/significant other Available Help at Discharge: Family;Available 24 hours/day Type of Home:  Apartment Home Access: Level entry     Home Layout: One level     Bathroom Shower/Tub: Tub/shower unit;Curtain   Firefighter: Standard Bathroom Accessibility: Yes How Accessible: Accessible via walker Home Equipment: Rollator (4 wheels);Shower seat;Cane - single point          Prior Functioning/Environment Prior Level of Function : Driving;Independent/Modified Independent             Mobility Comments: Ambulates without AD in the home, uses rollator outside of the home. Denies falls, reports he still drives. ADLs Comments: independent with ADLs/IADLs, still drives. wife handles most of the shopping        OT Problem List: Decreased activity tolerance      OT Treatment/Interventions:      OT Goals(Current goals can be found in the care plan section) Acute Rehab OT Goals Patient Stated Goal: home soon OT Goal Formulation: All assessment and education complete, DC therapy  OT Frequency:      Co-evaluation              AM-PAC OT "6 Clicks" Daily Activity     Outcome Measure Help from another person eating meals?: None Help from another person taking care of personal grooming?: None Help from another person toileting, which includes using toliet, bedpan, or urinal?: None Help from another person bathing (including washing, rinsing, drying)?: None Help from another person to put on and taking off regular upper body clothing?: None Help from another person to put on and taking off regular lower body clothing?: None 6 Click Score: 24   End of Session    Activity Tolerance: Patient tolerated treatment well Patient left: in bed;with call bell/phone within reach  OT Visit Diagnosis: Unsteadiness on feet (R26.81)                Time: 1017-5102 OT Time Calculation (min): 12 min Charges:  OT General Charges $OT Visit: 1 Visit OT Evaluation $OT Eval Low Complexity: 1 Low  Bradd Canary, OTR/L Acute Rehab Services Office: [phone]   Lorre Munroe 06/26/2022, 11:28 AM

## 2022-06-26 NOTE — Progress Notes (Signed)
Patient's had recurrent admissions for a/c CHF and hypertensive emergency.  Note 99% left renal artery stenosis on abdominal aortogram in 12/21.  Will obtain renal artery duplex to better evaluate.

## 2022-06-26 NOTE — Progress Notes (Addendum)
Advanced Heart Failure Rounding Note  PCP-Cardiologist: Quay Burow, MD   Subjective:    CO-OX 53% at 3 am.  SCr 1.57>>1.86>>2.32>>2.43. Off diuretics. Is/Os incomplete. Multiple unmeasured voids.  CVP 5-6. Dyspnea better but still reports some orthopnea last night.  BP elevated this am up to 150s. Off all BP meds except coreg d/t hypotension and AKI.    Objective:   Weight Range: 98.2 kg Body mass index is 31.96 kg/m.   Vital Signs:   Temp:  [97.7 F (36.5 C)-98.6 F (37 C)] 97.8 F (36.6 C) (10/12 0301) Pulse Rate:  [57-76] 67 (10/12 0301) Resp:  [17-18] 18 (10/12 0301) BP: (95-151)/(58-84) 150/73 (10/12 0301) SpO2:  [91 %-98 %] 96 % (10/12 0301) Weight:  [98.2 kg] 98.2 kg (10/12 0302) Last BM Date : 06/25/22  Weight change: Filed Weights   06/24/22 1653 06/25/22 0135 06/26/22 0302  Weight: 96.7 kg 97.1 kg 98.2 kg    Intake/Output:   Intake/Output Summary (Last 24 hours) at 06/26/2022 0730 Last data filed at 06/26/2022 0338 Gross per 24 hour  Intake 1320 ml  Output 450 ml  Net 870 ml      Physical Exam    General:  Sitting on side of bed. No distress. HEENT: normal Neck: supple. Jvp ~ 7-8 cm. Carotids 2+ bilat; no bruits.  Cor: PMI nondisplaced. Regular rate & rhythm. No rubs, gallops or murmurs. Lungs: clear Abdomen: soft, obese, nontender, nondistended.  Extremities: no cyanosis, clubbing, rash, 1+ edema Neuro: alert & orientedx3, cranial nerves grossly intact. moves all 4 extremities w/o difficulty. Affect pleasant    Telemetry   Sinus with PACs, 70s (personally reviewed)  Labs    CBC Recent Labs    06/23/22 1752 06/24/22 0358 06/25/22 0524 06/26/22 0321  WBC 9.5   < > 10.9* 10.5  NEUTROABS 7.3  --   --   --   HGB 14.0   < > 13.8 12.7*  HCT 42.4   < > 41.1 37.3*  MCV 91.6   < > 89.9 89.0  PLT 307   < > 281 283   < > = values in this interval not displayed.   Basic Metabolic Panel Recent Labs    06/25/22 0524  06/26/22 0321  NA 138 135  K 4.2 3.3*  CL 105 100  CO2 20* 24  GLUCOSE 115* 150*  BUN 28* 28*  CREATININE 2.32* 2.43*  CALCIUM 8.6* 8.5*  MG 2.1 1.9   Liver Function Tests Recent Labs    06/23/22 1752  AST 23  ALT 34  ALKPHOS 73  BILITOT 0.7  PROT 6.5  ALBUMIN 3.3*   No results for input(s): "LIPASE", "AMYLASE" in the last 72 hours. Cardiac Enzymes No results for input(s): "CKTOTAL", "CKMB", "CKMBINDEX", "TROPONINI" in the last 72 hours.  BNP: BNP (last 3 results) Recent Labs    06/06/22 0206 06/23/22 1752 06/24/22 0358  BNP 196.2* 3,165.5* 3,698.8*    ProBNP (last 3 results) No results for input(s): "PROBNP" in the last 8760 hours.   D-Dimer Recent Labs    06/24/22 1709  DDIMER 0.86*   Hemoglobin A1C No results for input(s): "HGBA1C" in the last 72 hours. Fasting Lipid Panel No results for input(s): "CHOL", "HDL", "LDLCALC", "TRIG", "CHOLHDL", "LDLDIRECT" in the last 72 hours. Thyroid Function Tests No results for input(s): "TSH", "T4TOTAL", "T3FREE", "THYROIDAB" in the last 72 hours.  Invalid input(s): "FREET3"  Other results:   Imaging    No results found.   Medications:  Scheduled Medications:  amiodarone  400 mg Oral BID   apixaban  2.5 mg Oral BID   atorvastatin  40 mg Oral Daily   carvedilol  12.5 mg Oral BID WC   Chlorhexidine Gluconate Cloth  6 each Topical Daily   insulin aspart  0-5 Units Subcutaneous QHS   insulin aspart  0-6 Units Subcutaneous TID WC   levothyroxine  25 mcg Oral QAC breakfast   pantoprazole  40 mg Oral QODAY   sodium chloride flush  10-40 mL Intracatheter Q12H   sodium chloride flush  3 mL Intravenous Q12H    Infusions:  sodium chloride 20 mL/hr at 06/25/22 2230    PRN Medications: acetaminophen **OR** acetaminophen, ondansetron **OR** [DISCONTINUED] ondansetron (ZOFRAN) IV, senna-docusate, sodium chloride flush    Patient Profile   81 y/o male w/ h/o chronic diastolic heart failure w/ recent  drop in LVEF, 20-25%, cath w/ mild-mod CAD, PAF on Eliquis, HTN, Hypothyroidism, multiple re-admits for a/c CHF in the last year, readmitted w/ a/c CHF in setting of persistent atrial fibrillation.   Assessment/Plan   1. Acute on chronic systolic CHF: In setting of atrial fibrillation with mild RVR and initial uncontrolled HTN.  Echo in 8/23 showed EF 25-30% with normal RV, trivial MR, dilated IVC.  This was significantly lower than in the past (EF 50-55% in 3/23).  LHC in 9/23 showed nonobstructive CAD, unable to explain fall in EF. He was in atrial fibrillation in 8/23, ?tachy-mediated CMP.   - CO-OX 53% at 3 am. Recheck now. CI 2.7 on RHC 09/23. No significant valvular disease or anemia. - He had been getting IV Lasix and aggressive treatment for HTN. Now w/ AKI, Creatinine 1.57>>1.86>>2.32>>2.43. CVP 5-6. Will likely need to restart po diuretic soon. - Continue Coreg 12.5 mg bid.  - Off hydralazine/imdur, entresto, farxiga and spiro. Now hypertensive. Add back low-dose hydralazine for now and eventually switch to entresto once renal function improves - Cardiomems would be a good option for him.  - He is agreeable to paramedicine 2. Atrial fibrillation: He appears to have been in atrial fibrillation (with RVR at times) since 8/23.  Back in SR w/ PACs yesterday. Continue amio 400 mg BID. - Continue apixaban 2.5 mg bid - Need to maintain NSR, will consider ablation down the road.  3. HTN: Patient was admitted with hypertensive emergency/CHF.   He had aggressive initial BP control with transient hypotension.  Currently appears more stable.  - Slowly add back meds for BP control.  - I suspect that he is not compliant with his (very extensive) home anti-hypertensive regimen.  Paramedicine at discharge would be helpful.  4. CAD: H/o PCI x 2 to RCA.  Cath in 8/23 after EF found to be low on echo did not explain his cardiomyopathy.   - Continue statin.  - He is on apixaban so do not think he needs to  continue Plavix with no recent PCI.  5. PAD: He has severe right SFA and right EIA stenosis by 12/21 peripheral angiography.  Follows with Dr. Gwenlyn Found. No rest pain or pedal ulcerations.  6. AKI on CKD stage 3: Creatinine up to 2.4 from 1.57 on admit.  This may be due to hypotension with aggressive anti-hypertensive regimen +/- possible over-diuresis. Also need to r/o low output - Holding diuretics and majority of BP meds as above - Rechecking CO-OX - Primary team checking renal US  Mobilize. PT/OT.    Length of Stay: 3  Jenilyn Magana N, PA-C  06/26/2022,  7:30 AM    Hoang Pettingill N 06/26/2022 7:30 AM  Advanced Heart Failure Team Pager [phone] (M-F; 7a - 5p)  Please contact [facility] for night-coverage after hours (5p -7a ) and weekends on [email or web address]

## 2022-06-26 NOTE — Progress Notes (Signed)
Heart and Vascular Care Navigation  06/26/2022  Albert Powell 1941/05/23 [id number]  Reason for Referral: Outpatient HF CSW consulted to enroll pt in [facility] program.     Engaged with patient face to face for initial visit for Heart and Vascular Care Coordination.                                                                                                   Assessment:    Paramedicine Initial Assessment:  Housing:  In what kind of housing do you live? House/apt/trailer/shelter? home  Do you live with anyone? wife  Are you currently worried about losing your housing? no  Social:  What is your current marital status? married  Do you have any children? dtr  Do you have family or friends who live locally? No one else besides dtr  Food:  No concerns  Income:  What is your current source of income? Retirement income  Insurance:  Are you currently insured? Medicare and supplement   Transportation:  Do you have transportation to your medical appointments? Continues to drive   Daily Health Needs: How do you manage your medications at home? Takes them out of the bottle  Do you ever take your medications differently than prescribed? no  Do you have issues affording your medications? no  Do you have any concerns with mobility at home? Sometimes but is usually independent  Do you use any assistive devices at home or have PCS at home? A walker when need be  Do you have a PCP? Dr. Chales Salmon  Do you have any trouble reading or writing? no  Do you currently use tobacco products or have recently quit? vapes  Are there any additional barriers you see to getting the care you need? no                                 HRT/VAS Care Coordination     Patients Home Cardiology Office --  [facility]   Outpatient Care Team Social Worker   Social Worker Name: Lasandra Beech, [place] [phone]   Living arrangements for the past 2 months Single Family Home    Lives with: Spouse   Patient Current Insurance Coverage Traditional Medicare   Patient Has Concern With Paying Medical Bills No   Does Patient Have Prescription Coverage? Yes   Home Assistive Devices/Equipment Walker (specify type)   DME Agency NA   HH Agency [facility]   Current home services --  walker, cane       Social History:                                                                             SDOH Screenings  Food Insecurity: No Food Insecurity (06/03/2022)  Housing: Low Risk  (06/03/2022)  Transportation Needs: No Transportation Needs (06/03/2022)  Utilities: Not At Risk (06/03/2022)  Alcohol Screen: Low Risk  (06/03/2022)  Financial Resource Strain: Low Risk  (06/03/2022)  Tobacco Use: Medium Risk (06/23/2022)    Follow-up plan:    Will send referral to paramedics for assignment and follow up.  [name], LCSW Clinical Social Worker Advanced [facility] Desk#: [phone] Cell#: [phone]

## 2022-06-26 NOTE — Progress Notes (Addendum)
PROGRESS NOTE    ANDR… BRADING  [id number] DOB: 14-May-1941 DOA: 06/23/2022 PCP: Leeroy Cha, MD   81 y/o male w/ h/o chronic diastolic heart failure w/ recent drop in LVEF, 20-25%, cath w/ mild-mod CAD, PAF on Eliquis, HTN, Hypothyroidism, multiple patients with CHF, readmitted w/ a/c CHF in setting of persistent atrial fibrillation.  Improving on diuretics, advanced heart failure team following -Now complicated by AKI  Subjective: -Okay overall, denies any shortness of breath this morning, mild orthopnea overnight  Assessment and Plan:  Acute on chronic systolic CHF: -Recent echo 8/23 noted EF of 20-25% with normal RV function, considerably lower compared to echo earlier this year with a EF of 50-55% -Left heart cath in 9/23 noted nonobstructive CAD, A-fib was suspected to be the cause of his cardiomyopathy -Advanced heart failure team following, diuresis intermittently limited by hypotension -Now diuretics on hold, hydralazine and Imdur discontinued as well -Creatinine continues to trend up slowly -Continue Coreg -converted to sinus rhythm yesterday -CardioMEMS being considered  Paroxysmal A-fib -Felt to be the cause of his recent cardiomyopathy -Now on p.o. amiodarone, continue apixaban -Plan for cardioversion canceled, now in sinus rhythm  AKI on CKD 3a -Creatinine up to 2.48 from 1.5 this admission -Likely cardiorenal, hypotension likely contributing as well -Avoid hypotension, diuretics on hold today, heart failure team following, CVP was 2 yesterday, now 5 -Check renal ultrasound as well  CAD -History of PCI to RCA in the past, cath in 9/23 noted nonobstructive CAD -Continue Coreg and statin   PAD:  -Continue statin and Eliquis  Type 2 diabetes mellitus -Hemoglobin A1c was 7.2 in August,, CBGs are stable, Farxiga on hold  Hypothyroidism -Continue Synthroid  Hypokalemia -Replaced   DVT prophylaxis: Apixaban Code Status: DNR Family  Communication:d/w pt,  no family at bedside Disposition Plan: Home likely 2 to 3 days  Consultants: CHF team   Procedures:   Antimicrobials:    Objective: Vitals:   06/26/22 0012 06/26/22 0301 06/26/22 0302 06/26/22 0814  BP: (!) 151/80 (!) 150/73  (!) 145/55  Pulse: 76 67  77  Resp:  18  20  Temp: 98.6 F (37 C) 97.8 F (36.6 C)  98.7 F (37.1 C)  TempSrc: Oral Oral  Oral  SpO2: 91% 96%  96%  Weight:   98.2 kg   Height:        Intake/Output Summary (Last 24 hours) at 06/26/2022 1001 Last data filed at 06/26/2022 [id number] Gross per 24 hour  Intake 1810 ml  Output 1015 ml  Net 795 ml   Filed Weights   06/24/22 1653 06/25/22 0135 06/26/22 0302  Weight: 96.7 kg 97.1 kg 98.2 kg    Examination:  General exam: Pleasant male sitting up in bed, AAOx3, no distress HEENT: No JVD CVS: S1-S2, regular rhythm Lungs: Clear bilaterally Abdomen: Soft, nontender, bowel sounds present Extremities: No edema  Skin: No rashes Psychiatry:  Mood & affect appropriate.     Data Reviewed:   CBC: Recent Labs  Lab 06/23/22 1752 06/24/22 0358 06/25/22 0524 06/26/22 0321  WBC 9.5 8.4 10.9* 10.5  NEUTROABS 7.3  --   --   --   HGB 14.0 13.3 13.8 12.7*  HCT 42.4 39.0 41.1 37.3*  MCV 91.6 88.8 89.9 89.0  PLT 307 311 281 [id number]   Basic Metabolic Panel: Recent Labs  Lab 06/23/22 1752 06/24/22 0358 06/25/22 0524 06/26/22 0321  NA 137 138 138 135  K 3.0* 3.0* 4.2 3.3*  CL 98 99 105 100  CO2 26 24 20* 24  GLUCOSE 182* 143* 115* 150*  BUN 15 15 28* 28*  CREATININE 1.57* 1.86* 2.32* 2.43*  CALCIUM 8.8* 8.7* 8.6* 8.5*  MG 2.1 2.0 2.1 1.9   GFR: Estimated Creatinine Clearance: 28 mL/min (A) (by C-G formula based on SCr of 2.43 mg/dL (H)). Liver Function Tests: Recent Labs  Lab 06/23/22 1752  AST 23  ALT 34  ALKPHOS 73  BILITOT 0.7  PROT 6.5  ALBUMIN 3.3*   No results for input(s): "LIPASE", "AMYLASE" in the last 168 hours. No results for input(s): "AMMONIA" in the last  168 hours. Coagulation Profile: Recent Labs  Lab 06/24/22 1709  INR 1.4*   Cardiac Enzymes: No results for input(s): "CKTOTAL", "CKMB", "CKMBINDEX", "TROPONINI" in the last 168 hours. BNP (last 3 results) No results for input(s): "PROBNP" in the last 8760 hours. HbA1C: No results for input(s): "HGBA1C" in the last 72 hours. CBG: Recent Labs  Lab 06/25/22 0611 06/25/22 1130 06/25/22 1637 06/25/22 2108 06/26/22 0558  GLUCAP 110* 249* 105* 153* 121*   Lipid Profile: No results for input(s): "CHOL", "HDL", "LDLCALC", "TRIG", "CHOLHDL", "LDLDIRECT" in the last 72 hours. Thyroid Function Tests: No results for input(s): "TSH", "T4TOTAL", "FREET4", "T3FREE", "THYROIDAB" in the last 72 hours. Anemia Panel: No results for input(s): "VITAMINB12", "FOLATE", "FERRITIN", "TIBC", "IRON", "RETICCTPCT" in the last 72 hours. Urine analysis:    Component Value Date/Time   COLORURINE YELLOW 01/01/2021 1351   APPEARANCEUR CLEAR 01/01/2021 1351   LABSPEC 1.009 01/01/2021 1351   PHURINE 6.0 01/01/2021 1351   GLUCOSEU >=500 (A) 01/01/2021 1351   HGBUR SMALL (A) 01/01/2021 1351   BILIRUBINUR NEGATIVE 01/01/2021 1351   KETONESUR NEGATIVE 01/01/2021 1351   PROTEINUR >=300 (A) 01/01/2021 1351   UROBILINOGEN 1.0 05/31/2013 1432   NITRITE NEGATIVE 01/01/2021 1351   LEUKOCYTESUR NEGATIVE 01/01/2021 1351   Sepsis Labs: @LABRCNTIP (procalcitonin:4,lacticidven:4)  )No results found for this or any previous visit (from the past 240 hour(s)).   Radiology Studies: [place] EKG SITE RITE  Result Date: 06/24/2022 If Site Rite image not attached, placement could not be confirmed due to current cardiac rhythm.    Scheduled Meds:  amiodarone  400 mg Oral BID   apixaban  2.5 mg Oral BID   atorvastatin  40 mg Oral Daily   carvedilol  12.5 mg Oral BID WC   Chlorhexidine Gluconate Cloth  6 each Topical Daily   hydrALAZINE  10 mg Oral Q8H   insulin aspart  0-5 Units Subcutaneous QHS   insulin aspart  0-6  Units Subcutaneous TID WC   levothyroxine  25 mcg Oral QAC breakfast   pantoprazole  40 mg Oral QODAY   sodium chloride flush  10-40 mL Intracatheter Q12H   sodium chloride flush  3 mL Intravenous Q12H   Continuous Infusions:  sodium chloride 20 mL/hr at 06/25/22 2230   magnesium sulfate bolus IVPB       LOS: 3 days    Time spent: 62min    Domenic Polite, MD [facility]   06/26/2022, 10:01 AM

## 2022-06-26 NOTE — Plan of Care (Signed)
  Problem: Education: Goal: Ability to demonstrate management of disease process will improve Outcome: Progressing   Problem: Education: Goal: Ability to verbalize understanding of medication therapies will improve Outcome: Progressing   Problem: Cardiac: Goal: Ability to achieve and maintain adequate cardiopulmonary perfusion will improve Outcome: Progressing   Problem: Fluid Volume: Goal: Ability to maintain a balanced intake and output will improve Outcome: Progressing

## 2022-06-26 NOTE — Evaluation (Signed)
Physical Therapy Evaluation Patient Details Name: Albert Powell MRN: [id number] DOB: October 27, 1940 Today's Date: 06/26/2022  History of Present Illness  Pt is an 81 y/o M admitted on 06/23/22 for treatment of acute on chronic systolic CHF. PMH: chronic diastolic heart failure with recent drop in LVEF 20-25%, cath with mild-mod CAD, PAF on eliquis, HTN, hypothyroidism  Clinical Impression  Pt seen for PT evaluation with pt reporting he's ambulatory without AD in the home but uses rollator "for any kind of distance". On this date, pt is mod I for bed mobility & STS from EOB, CGA<>supervision for short distance gait with pt declining use of AD. Pt reports current gait pattern is comparable to his baseline. SpO2 after gait 81% but question accuracy 2/2 decreased pleth waveform & pt denied SOB, SPO2 quickly increased to 91% or >. Pt declines f/u therapy after d/c but would benefit from acute PT services to address strengthening, endurance, balance, and gait with LRAD.      Recommendations for follow up therapy are one component of a multi-disciplinary discharge planning process, led by the attending physician.  Recommendations may be updated based on patient status, additional functional criteria and insurance authorization.  Follow Up Recommendations No PT follow up      Assistance Recommended at Discharge Set up Supervision/Assistance  Patient can return home with the following  A little help with walking and/or transfers    Equipment Recommendations None recommended by PT  Recommendations for Other Services       Functional Status Assessment Patient has had a recent decline in their functional status and demonstrates the ability to make significant improvements in function in a reasonable and predictable amount of time.     Precautions / Restrictions Precautions Precautions: Fall Restrictions Weight Bearing Restrictions: No      Mobility  Bed Mobility Overal bed mobility: Modified  Independent Bed Mobility: Supine to Sit     Supine to sit: Modified independent (Device/Increase time), HOB elevated     General bed mobility comments: supine>sit with HOB fully elevated    Transfers Overall transfer level: Modified independent   Transfers: Sit to/from Stand Sit to Stand: Modified independent (Device/Increase time)           General transfer comment: STS from EOB    Ambulation/Gait Ambulation/Gait assistance: Supervision, Min guard Gait Distance (Feet): 40 Feet Assistive device: None Gait Pattern/deviations: Decreased step length - right, Decreased stride length, Decreased dorsiflexion - left, Decreased weight shift to right Gait velocity: decreased     General Gait Details: Pt declines use of RW on this date. Pt ambulates in room/hallway without AD & close supervision<>CGA. Pt demonstrates decreased heel strike BLE. Pt intermittently reaching for furniture for stability, requiring cuing not to, with pt reporting he doesn't usually hold to furniture at home.  Stairs            Wheelchair Mobility    Modified Rankin (Stroke Patients Only)       Balance Overall balance assessment: Needs assistance Sitting-balance support: Feet supported Sitting balance-Leahy Scale: Good     Standing balance support: During functional activity, No upper extremity supported Standing balance-Leahy Scale: Fair                               Pertinent Vitals/Pain Pain Assessment Pain Assessment: No/denies pain    Home Living Family/patient expects to be discharged to:: Private residence Living Arrangements: Spouse/significant other Available Help at  Discharge: Family;Available 24 hours/day Type of Home: Apartment Home Access: Level entry       Home Layout: One level        Prior Function Prior Level of Function : Driving;Independent/Modified Independent             Mobility Comments: Ambulates without AD in the home, uses rollator  outside of the home. Denies falls, reports he still drives.       Hand Dominance        Extremity/Trunk Assessment   Upper Extremity Assessment Upper Extremity Assessment: Overall WFL for tasks assessed    Lower Extremity Assessment Lower Extremity Assessment: Generalized weakness    Cervical / Trunk Assessment Cervical / Trunk Assessment: Normal  Communication   Communication: No difficulties  Cognition Arousal/Alertness: Awake/alert Behavior During Therapy: WFL for tasks assessed/performed Overall Cognitive Status: Within Functional Limits for tasks assessed                                 General Comments: Pleasant man        General Comments General comments (skin integrity, edema, etc.): HR 67-76 bpm, SpO2 81% after gait but question accuracy as not great pleth waveform, pt denied SOB & SPO2 quickly increased to 91% or >    Exercises     Assessment/Plan    PT Assessment Patient needs continued PT services  PT Problem List Decreased strength;Decreased activity tolerance;Decreased mobility;Decreased balance;Decreased knowledge of use of DME;Cardiopulmonary status limiting activity       PT Treatment Interventions DME instruction;Gait training;Stair training;Functional mobility training;Therapeutic activities;Balance training;Patient/family education;Therapeutic exercise;Neuromuscular re-education    PT Goals (Current goals can be found in the Care Plan section)  Acute Rehab PT Goals Patient Stated Goal: go home PT Goal Formulation: With patient Time For Goal Achievement: 07/10/22 Potential to Achieve Goals: Good    Frequency Min 3X/week     Co-evaluation               AM-PAC PT "6 Clicks" Mobility  Outcome Measure Help needed turning from your back to your side while in a flat bed without using bedrails?: None Help needed moving from lying on your back to sitting on the side of a flat bed without using bedrails?: None Help needed  moving to and from a bed to a chair (including a wheelchair)?: A Little Help needed standing up from a chair using your arms (e.g., wheelchair or bedside chair)?: None Help needed to walk in hospital room?: A Little Help needed climbing 3-5 steps with a railing? : A Little 6 Click Score: 21    End of Session   Activity Tolerance: Patient tolerated treatment well Patient left: in chair;with bed alarm set;with call bell/phone within reach Nurse Communication: Mobility status (urine output in urinal) PT Visit Diagnosis: Unsteadiness on feet (R26.81);Muscle weakness (generalized) (M62.81)    Time: 6301-6010 PT Time Calculation (min) (ACUTE ONLY): 15 min   Charges:   PT Evaluation $PT Eval Low Complexity: Guys, PT, DPT 06/26/22, 9:47 AM   Waunita Schooner 06/26/2022, 9:45 AM

## 2022-06-26 NOTE — Progress Notes (Signed)
   06/26/22 1000  Mobility  Activity Transferred from chair to bed  Level of Assistance Standby assist, set-up cues, supervision of patient - no hands on  Assistive Device Other (Comment) (HHA)  Distance Ambulated (ft) 5 ft  Activity Response Tolerated well  Mobility Referral Yes  $Mobility charge 1 Mobility   Mobility Specialist Progress Note  Pt was in chair and agreeable to transfer. Left in bed w/ all needs met and call bell in reach.   Lucious Groves Mobility Specialist

## 2022-06-27 ENCOUNTER — Inpatient Hospital Stay (HOSPITAL_COMMUNITY): Payer: Medicare Other

## 2022-06-27 DIAGNOSIS — I5023 Acute on chronic systolic (congestive) heart failure: Secondary | ICD-10-CM | POA: Diagnosis not present

## 2022-06-27 DIAGNOSIS — I160 Hypertensive urgency: Secondary | ICD-10-CM | POA: Diagnosis not present

## 2022-06-27 LAB — BASIC METABOLIC PANEL
Anion gap: 9 (ref 5–15)
Anion gap: 8 (ref 5–15)
BUN: 29 mg/dL — ABNORMAL HIGH (ref 8–23)
BUN: 27 mg/dL — ABNORMAL HIGH (ref 8–23)
CO2: 23 mmol/L (ref 22–32)
CO2: 24 mmol/L (ref 22–32)
Calcium: 8.3 mg/dL — ABNORMAL LOW (ref 8.9–10.3)
Calcium: 7.9 mg/dL — ABNORMAL LOW (ref 8.9–10.3)
Chloride: 103 mmol/L (ref 98–111)
Chloride: 105 mmol/L (ref 98–111)
Creatinine, Ser: 2.31 mg/dL — ABNORMAL HIGH (ref 0.61–1.24)
Creatinine, Ser: 2.21 mg/dL — ABNORMAL HIGH (ref 0.61–1.24)
GFR, Estimated: 28 mL/min — ABNORMAL LOW (ref >60)
GFR, Estimated: 29 mL/min — ABNORMAL LOW (ref >60)
Glucose, Bld: 166 mg/dL — ABNORMAL HIGH (ref 70–99)
Glucose, Bld: 258 mg/dL — ABNORMAL HIGH (ref 70–99)
Potassium: 3.8 mmol/L (ref 3.5–5.1)
Potassium: 3.5 mmol/L (ref 3.5–5.1)
Sodium: 135 mmol/L (ref 135–145)
Sodium: 137 mmol/L (ref 135–145)

## 2022-06-27 LAB — GLUCOSE, CAPILLARY
Glucose-Capillary: 153 mg/dL — ABNORMAL HIGH (ref 70–99)
Glucose-Capillary: 158 mg/dL — ABNORMAL HIGH (ref 70–99)
Glucose-Capillary: 207 mg/dL — ABNORMAL HIGH (ref 70–99)
Glucose-Capillary: 165 mg/dL — ABNORMAL HIGH (ref 70–99)

## 2022-06-27 LAB — COOXEMETRY PANEL
Carboxyhemoglobin: 1.3 % (ref 0.5–1.5)
Methemoglobin: 0.7 % (ref 0.0–1.5)
O2 Saturation: 63.8 %
Total hemoglobin: 14.1 g/dL (ref 12.0–16.0)

## 2022-06-27 MED ORDER — ISOSORBIDE MONONITRATE ER 30 MG PO TB24
30.0000 mg | ORAL_TABLET | Freq: Every day | ORAL | Status: DC
  Administered 2022-06-27 – 2022-06-28 (×2): 30 mg via ORAL
  Filled 2022-06-27 (×2): qty 1

## 2022-06-27 MED ORDER — FUROSEMIDE 10 MG/ML IJ SOLN
40.0000 mg | Freq: Once | INTRAMUSCULAR | Status: AC
  Administered 2022-06-27: 40 mg via INTRAVENOUS
  Filled 2022-06-27: qty 4

## 2022-06-27 MED ORDER — HYDRALAZINE HCL 25 MG PO TABS: 25.0000 mg | ORAL_TABLET | Freq: Three times a day (TID) | ORAL | Status: DC

## 2022-06-27 MED ORDER — FUROSEMIDE 10 MG/ML IJ SOLN
80.0000 mg | Freq: Once | INTRAMUSCULAR | Status: AC
  Administered 2022-06-27: 80 mg via INTRAVENOUS
  Filled 2022-06-27: qty 8

## 2022-06-27 MED ORDER — HYDRALAZINE HCL 20 MG/ML IJ SOLN
10.0000 mg | Freq: Once | INTRAMUSCULAR | Status: AC
  Administered 2022-06-27: 10 mg via INTRAVENOUS
  Filled 2022-06-27: qty 1

## 2022-06-27 MED ORDER — HYDRALAZINE HCL 25 MG PO TABS
25.0000 mg | ORAL_TABLET | Freq: Three times a day (TID) | ORAL | Status: DC
  Administered 2022-06-27 – 2022-06-28 (×2): 25 mg via ORAL
  Filled 2022-06-27 (×2): qty 1

## 2022-06-27 MED ORDER — ISOSORBIDE MONONITRATE ER 30 MG PO TB24: 30.0000 mg | ORAL_TABLET | Freq: Every day | ORAL | Status: DC

## 2022-06-27 MED ORDER — HYDRALAZINE HCL 10 MG PO TABS
10.0000 mg | ORAL_TABLET | Freq: Three times a day (TID) | ORAL | Status: DC
  Administered 2022-06-27: 10 mg via ORAL
  Filled 2022-06-27: qty 1

## 2022-06-27 MED ORDER — POTASSIUM CHLORIDE CRYS ER 20 MEQ PO TBCR
40.0000 meq | EXTENDED_RELEASE_TABLET | Freq: Once | ORAL | Status: AC
  Administered 2022-06-27: 40 meq via ORAL
  Filled 2022-06-27: qty 2

## 2022-06-27 NOTE — Progress Notes (Signed)
PROGRESS NOTE    Albert Powell  JFH:[id number] DOB: 07-Apr-1941 DOA: 06/23/2022 PCP: [name], MD   81 y/o male w/ h/o chronic diastolic heart failure w/ recent drop in LVEF, 20-25%, cath w/ mild-mod CAD, PAF on Eliquis, HTN, Hypothyroidism, multiple patients with CHF, readmitted w/ a/c CHF in setting of persistent atrial fibrillation.  Improving on diuretics, advanced heart failure team following -Now complicated by AKI  Subjective: -Okay overall, denies any shortness of breath this morning, mild orthopnea overnight  Assessment and Plan:  Acute on chronic systolic CHF: -Recent echo 8/23 noted EF of 20-25% with normal RV function, considerably lower compared to echo earlier this year with a EF of 50-55% -Left heart cath in 9/23 noted nonobstructive CAD, A-fib was suspected to be the cause of his cardiomyopathy -Advanced heart failure team following, diuresis intermittently limited by hypotension -Creatinine plateauing, overnight with flash pulmonary edema, given 40 Mg Lasix x1, and repeat IV Lasix with hydralazine earlier today -Continue Coreg, avoid ARB/ARN I with concern for RAS -converted to sinus rhythm 10/11 -BMP in a.m.  Paroxysmal A-fib -Felt to be the cause of his recent cardiomyopathy -Now on p.o. amiodarone, continue apixaban -Now in sinus rhythm  AKI on CKD 3a Renal artery stenosis -Creatinine up to 2.48 from 1.5 this admission -Likely cardiorenal, hypotension likely contributing as well, concern for renal artery stenosis, had 99% left renal artery stenosis on angiogram in 2021, follow-up renal artery duplex, left kidney is atrophic on ultrasound -Creatinine plateauing,  CAD -History of PCI to RCA in the past, cath in 9/23 noted nonobstructive CAD -Continue Coreg and statin   PAD:  -Continue statin and Eliquis  Type 2 diabetes mellitus -Hemoglobin A1c was 7.2 in August,, CBGs are stable, Farxiga on hold  Hypothyroidism -Continue  Synthroid  Hypokalemia -Replaced   DVT prophylaxis: Apixaban Code Status: DNR Family Communication:d/w pt,  no family at bedside Disposition Plan: Home likely 2 to 3 days  Consultants: CHF team   Procedures:   Antimicrobials:    Objective: Vitals:   06/27/22 0900 06/27/22 1000 06/27/22 1004 06/27/22 1036  BP: (!) 181/67 (!) 179/77 (!) 155/141 112/87  Pulse: 77   86  Resp:      Temp:    98 F (36.7 C)  TempSrc:    Oral  SpO2: 96%   91%  Weight:      Height:        Intake/Output Summary (Last 24 hours) at 06/27/2022 1112 Last data filed at 06/27/2022 1042 Gross per 24 hour  Intake 484 ml  Output 1525 ml  Net -1041 ml   Filed Weights   06/25/22 0135 06/26/22 0302 06/27/22 0359  Weight: 97.1 kg 98.2 kg 98.8 kg    Examination:  General exam: Pleasant elderly male sitting up in bed, AAOx3, no distress HEENT: Positive JVD CVS: S1-S2, regular rhythm Lungs: Few basilar rales Abdomen: Soft, nontender, bowel sounds present Extremities: 1+ edema Skin: No rashes Psychiatry:  Mood & affect appropriate.     Data Reviewed:   CBC: Recent Labs  Lab 06/23/22 1752 06/24/22 0358 06/25/22 0524 06/26/22 0321  WBC 9.5 8.4 10.9* 10.5  NEUTROABS 7.3  --   --   --   HGB 14.0 13.3 13.8 12.7*  HCT 42.4 39.0 41.1 37.3*  MCV 91.6 88.8 89.9 89.0  PLT 307 311 281 283   Basic Metabolic Panel: Recent Labs  Lab 06/23/22 1752 06/24/22 0358 06/25/22 0524 06/26/22 0321 06/27/22 0417  NA 137 138 138 135 135  K 3.0* 3.0* 4.2 3.3* 3.8  CL 98 99 105 100 103  CO2 26 24 20* 24 23  GLUCOSE 182* 143* 115* 150* 166*  BUN 15 15 28* 28* 29*  CREATININE 1.57* 1.86* 2.32* 2.43* 2.31*  CALCIUM 8.8* 8.7* 8.6* 8.5* 8.3*  MG 2.1 2.0 2.1 1.9  --    GFR: Estimated Creatinine Clearance: 29.5 mL/min (A) (by C-G formula based on SCr of 2.31 mg/dL (H)). Liver Function Tests: Recent Labs  Lab 06/23/22 1752  AST 23  ALT 34  ALKPHOS 73  BILITOT 0.7  PROT 6.5  ALBUMIN 3.3*   No  results for input(s): "LIPASE", "AMYLASE" in the last 168 hours. No results for input(s): "AMMONIA" in the last 168 hours. Coagulation Profile: Recent Labs  Lab 06/24/22 1709  INR 1.4*   Cardiac Enzymes: No results for input(s): "CKTOTAL", "CKMB", "CKMBINDEX", "TROPONINI" in the last 168 hours. BNP (last 3 results) No results for input(s): "PROBNP" in the last 8760 hours. HbA1C: No results for input(s): "HGBA1C" in the last 72 hours. CBG: Recent Labs  Lab 06/26/22 1109 06/26/22 1556 06/26/22 2115 06/27/22 0625 06/27/22 1110  GLUCAP 196* 222* 165* 153* 158*   Lipid Profile: No results for input(s): "CHOL", "HDL", "LDLCALC", "TRIG", "CHOLHDL", "LDLDIRECT" in the last 72 hours. Thyroid Function Tests: No results for input(s): "TSH", "T4TOTAL", "FREET4", "T3FREE", "THYROIDAB" in the last 72 hours. Anemia Panel: No results for input(s): "VITAMINB12", "FOLATE", "FERRITIN", "TIBC", "IRON", "RETICCTPCT" in the last 72 hours. Urine analysis:    Component Value Date/Time   COLORURINE YELLOW 01/01/2021 1351   APPEARANCEUR CLEAR 01/01/2021 1351   LABSPEC 1.009 01/01/2021 1351   PHURINE 6.0 01/01/2021 1351   GLUCOSEU >=500 (A) 01/01/2021 1351   HGBUR SMALL (A) 01/01/2021 1351   BILIRUBINUR NEGATIVE 01/01/2021 1351   KETONESUR NEGATIVE 01/01/2021 1351   PROTEINUR >=300 (A) 01/01/2021 1351   UROBILINOGEN 1.0 05/31/2013 1432   NITRITE NEGATIVE 01/01/2021 1351   LEUKOCYTESUR NEGATIVE 01/01/2021 1351   Sepsis Labs: @LABRCNTIP (procalcitonin:4,lacticidven:4)  )No results found for this or any previous visit (from the past 240 hour(s)).   Radiology Studies: VAS US RENAL ARTERY DUPLEX  Result Date: 06/27/2022 ABDOMINAL VISCERAL Patient Name:  Albert Powell  Date of Exam:   06/27/2022 Medical Rec #: [id number]         Accession #:    [id number] Date of Birth: 07/02/1941         Patient Gender: M Patient Age:   37 years Exam Location:  [facility] Procedure:      VAS US RENAL  ARTERY DUPLEX Referring Phys: [id number] LINDSAY [facility] -------------------------------------------------------------------------------- Indications: Hypertension, history of left renal artery stenosis 99% High Risk Factors: Diabetes, coronary artery disease. Other Factors: Acute on chronic CHF. Limitations: Obesity and Respiratory interference- patient on O2 support, unable to withhold respirations. Comparison Study: 06-26-2022 Prior renal duplex showed atrophic left kidney with                   multiple cysts, largest >3cm                    99% Left renal artery stenosis on angiogram 2021. Performing Technologist: Jean Rosenthal RDMS, RVT  Examination Guidelines: A complete evaluation includes B-mode imaging, spectral Doppler, color Doppler, and power Doppler as needed of all accessible portions of each vessel. Bilateral testing is considered an integral part of a complete examination. Limited examinations for reoccurring indications may be performed as noted.  Duplex Findings: +------------------+--------+--------+-------+  Right Renal ArteryPSV cm/sEDV cm/sComment +------------------+--------+--------+-------+ Origin              249      21           +------------------+--------+--------+-------+ Proximal            223      33           +------------------+--------+--------+-------+ Mid                 191      35           +------------------+--------+--------+-------+ Distal              104      21           +------------------+--------+--------+-------+ +-----------------+--------+--------+---------------------------+ Left Renal ArteryPSV cm/sEDV cm/s          Comment           +-----------------+--------+--------+---------------------------+ Proximal                         Unable to obtain LRA signal +-----------------+--------+--------+---------------------------+ +------------+--------+--------+----+-----------+--------+--------+------------+ Right KidneyPSV cm/sEDV  cm/sRI  Left KidneyPSV cm/sEDV cm/sRI           +------------+--------+--------+----+-----------+--------+--------+------------+ Upper Pole  16      5       0.68Upper Pole                              +------------+--------+--------+----+-----------+--------+--------+------------+ Mid         56      9       0.84Mid                        No evidence                                                             of flow                                                                 within left                                                             kidney by                                                               color  doppler      +------------+--------+--------+----+-----------+--------+--------+------------+ Lower Pole  30      8       0.73Lower Pole                              +------------+--------+--------+----+-----------+--------+--------+------------+ Hilar       76      14      0.82Hilar                                   +------------+--------+--------+----+-----------+--------+--------+------------+ +------------------+-----+------------------+----------------------------------+ Right Kidney           Left Kidney                                          +------------------+-----+------------------+----------------------------------+ RAR                    RAR                                                  +------------------+-----+------------------+----------------------------------+ RAR (manual)      3.23 RAR (manual)      Unable to obtain LRA signal,                                                history of 99% stenosis in 2021    +------------------+-----+------------------+----------------------------------+ Cortex                 Cortex                                                +------------------+-----+------------------+----------------------------------+ Cortex thickness       Corex thickness                                      +------------------+-----+------------------+----------------------------------+ Kidney length (cm)10.96Kidney length (cm)7.50                               +------------------+-----+------------------+----------------------------------+   Summary: Renal:  Right: Normal size right kidney. Elevated velocities suggest >60%        stenosis at the renal artery origin; however, in the setting        of left renal atrophy with no observable flow, velocities may        overestimate degree of stenosis due to compensation. Left:  Abnormal size for the left kidney. Cyst(s) noted. Unable to        obtain left renal artery signal. No evidence of flow within        the left kidney by color doppler.  *See table(s) above for measurements and observations.     Preliminary    US RENAL  Result Date: 06/26/2022 CLINICAL DATA:  Acute kidney injury.  Inpatient. EXAM:  RENAL / URINARY TRACT ULTRASOUND COMPLETE COMPARISON:  05/23/2011 CT abdomen/pelvis. FINDINGS: Right Kidney: Renal measurements: 11.6 x 6.0 x 7.0 cm = volume: 252 mL. No hydronephrosis. No renal masses. Mildly echogenic renal parenchyma, normal thickness. Left Kidney: Renal measurements: 8.7 x 3.9 x 4.1 cm = volume: 73 mL. No hydronephrosis. Echogenic thin renal parenchyma. Simple left renal cysts, largest 3.0 x 3.1 x 3.0 cm in the lower left kidney. Bladder: Appears normal for degree of bladder distention. Other: None. IMPRESSION: 1. No hydronephrosis. 2. Asymmetric left renal atrophy. 3. Echogenic kidneys, compatible with nonspecific acute on chronic renal parenchymal disease. 4. Simple left renal cysts, for which no follow-up imaging is recommended. 5. Normal bladder. Electronically Signed   By: Delbert Phenix M.D.   On: 06/26/2022 12:15     Scheduled Meds:  amiodarone  400 mg Oral BID   apixaban   2.5 mg Oral BID   atorvastatin  40 mg Oral Daily   carvedilol  12.5 mg Oral BID WC   Chlorhexidine Gluconate Cloth  6 each Topical Daily   hydrALAZINE  10 mg Oral Q8H   insulin aspart  0-5 Units Subcutaneous QHS   insulin aspart  0-6 Units Subcutaneous TID WC   levothyroxine  25 mcg Oral QAC breakfast   pantoprazole  40 mg Oral QODAY   sodium chloride flush  10-40 mL Intracatheter Q12H   sodium chloride flush  3 mL Intravenous Q12H   Continuous Infusions:  sodium chloride 20 mL/hr at 06/25/22 2230     LOS: 4 days    Time spent:    Zannie Cove, MD [facility]   06/27/2022, 11:12 AM

## 2022-06-27 NOTE — Progress Notes (Signed)
Pt has been having SOB, Duoneb helps a bit for few hours.  Noted to have labored breathing, crackles, tachypnea. Cardiology on call  was paged and 40mg  IV lasix was ordered, as cards advised to have HF team Re-evaluate in AM.

## 2022-06-27 NOTE — Progress Notes (Signed)
I was made aware patient's systolic BP averaging > 831 this afternoon. Increase hydralazine to 25 TID and add imdur 30 daily.

## 2022-06-27 NOTE — TOC Initial Note (Signed)
Transition of Care [facility]) - Initial/Assessment Note    Patient Details  Name: Albert Powell MRN: [id number] Date of Birth: [birth year]  [facility] of [facility]) CM/SW Contact:    Elliot Cousin, RN Phone Number: [phone] 06/27/2022, 5:02 PM  Clinical Narrative:                  HF TOC CM spoke to pt at bedside. States he does not want HH. Had HH in the past. Pt states he weighs daily. Pt was independent PTA. Drives to his appt. Will continue to follow for dc needs.    Expected Discharge Plan: Home/Self Care Barriers to Discharge: Continued Medical Work up   Patient Goals and CMS Choice Patient states their goals for this hospitalization and ongoing recovery are:: wants to remain independent      Expected Discharge Plan and Services Expected Discharge Plan: Home/Self Care   Discharge Planning Services: CM Consult (rolling walker, cane, glucometer, scale, shower seat)   Living arrangements for the past 2 months: Single Family Home                                      Prior Living Arrangements/Services Living arrangements for the past 2 months: Single Family Home Lives with:: Spouse Patient language and need for interpreter reviewed:: Yes Do you feel safe going back to the place where you live?: Yes      Need for Family Participation in Patient Care: No (Comment) Care giver support system in place?: Yes (comment) Current home services: DME Criminal Activity/Legal Involvement Pertinent to Current Situation/Hospitalization: No - Comment as needed  Activities of Daily Living      Permission Sought/Granted Permission sought to share information with : Case Manager, Family Supports, PCP Permission granted to share information with : Yes, Verbal Permission Granted  Share Information with NAME: Zavior Sperle     Permission granted to share info w Relationship: wife  Permission granted to share info w Contact Information: [phone]  Emotional  Assessment Appearance:: Appears stated age Attitude/Demeanor/Rapport: Engaged Affect (typically observed): Accepting Orientation: : Oriented to Self, Oriented to Place, Oriented to  Time, Oriented to Situation   Psych Involvement: No (comment)  Admission diagnosis:  Acute respiratory failure with hypoxia ([name]) [J96.01] Acute on chronic systolic CHF (congestive heart failure) ([name]) [I50.23] Acute on chronic congestive heart failure, unspecified heart failure type ([name]) [I50.9] Patient Active Problem List   Diagnosis Date Noted   Hypokalemia 06/23/2022   Elevated troponin 06/23/2022   Type 2 diabetes mellitus with hyperlipidemia ([name]) 05/19/2022   Acute combined systolic and diastolic heart failure ([name])    Acute kidney injury superimposed on chronic kidney disease ([name]) 05/13/2022   GERD (gastroesophageal reflux disease) 05/13/2022   Acquired hypothyroidism 05/13/2022   Acute on chronic systolic CHF (congestive heart failure) ([name]) 11/23/2021   PAF (paroxysmal atrial fibrillation) ([name]) 10/08/2021   Acute on chronic diastolic heart failure ([name]) 06/07/2021   Acute metabolic encephalopathy    CHF (congestive heart failure) ([name]) 03/10/2021   Uncontrolled hypertension 03/10/2021   Hypertensive crisis 01/01/2021   ACS (acute coronary syndrome) ([name])    AKI (acute kidney injury) ([name])    Atrial fibrillation ([name]) 03/30/2020   Contusion of right hand 11/12/2018   Primary osteoarthritis of both first carpometacarpal joints 11/12/2018   Pain in joint of right shoulder 10/05/2018   Pain in right hand 10/05/2018  Hematoma 03/30/2018   Degeneration of lumbar intervertebral disc 12/21/2017   Lumbar radiculopathy 12/21/2017   Vertigo 04/09/2017   Hypogonadism in male 12/07/2014   Screening for prostate cancer 12/07/2014   Claudication of right lower extremity ([place]) 10/20/2013   Claudication in peripheral vascular disease- Rt leg 09/20/2013   Obesity (BMI 30-39.9)- negative sleep study  in the past 09/20/2013   Occlusion and stenosis of carotid artery without mention of cerebral infarction 05/23/2013   PAD (peripheral artery disease) ([place]) 05/18/2013   Carotid artery disease ([place]) 04/14/2013   Bradycardia, sinus 02/18/2013   Tobacco abuse 02/05/2013   Chest pain, unstable angina, negative MI 02/04/2013   Type 2 diabetes mellitus with stage 3b chronic kidney disease ([place]) 02/04/2013   Coronary artery disease involving native coronary artery of native heart without angina pectoris 02/04/2013   Benign essential hypertension 07/10/2011   Incisional hernia 05/22/2011   PCP:  Leeroy Cha, MD Pharmacy:   [facility] - [place], [place] - [address] [place] [address] Phone: [phone] Fax: [place] [address] Phone: [phone] Fax: [phone]     Social Determinants of Health (SDOH) Interventions    Readmission Risk Interventions     No data to display

## 2022-06-27 NOTE — Progress Notes (Signed)
PT Cancellation Note  Patient Details Name: Albert Powell MRN: [id number] DOB: [birth year]   Cancelled Treatment:    Reason Eval/Treat Not Completed: Medical issues which prohibited therapy.  Pt is quite hypertensive and will recommend hold to retry as pt can allow.   Ramond Dial 06/27/2022, 10:25 AM  Mee Hives, PT PhD Acute Rehab Dept. Number: [place]

## 2022-06-27 NOTE — Care Management Important Message (Signed)
Important Message  Patient Details  Name: Albert Powell MRN: [id number] Date of Birth: 11/07/1940   Medicare Important Message Given:  Yes     Shelda Altes 06/27/2022, 8:20 AM

## 2022-06-27 NOTE — Progress Notes (Addendum)
Advanced Heart Failure Rounding Note  PCP-Cardiologist: Quay Burow, MD   Subjective:    CO-OX 64% this am. Not on inotrope support.  Developed acute onset of dyspnea overnight. He was severely hypertensive with BP up to 200/115. He was given 40 mg lasix IV.   SBP remains above 619 systolic at time of my evaluation this am. BP cuff to small for bicep, once placed on wrist SBP remained above 200 (RN obtaining larger cuff).   Became acutely orthopneic while I was in room. CVP 9.  SCr 1.57>>1.86>>2.32>>2.43>>2.31.        Objective:   Weight Range: 98.8 kg Body mass index is 32.16 kg/m.   Vital Signs:   Temp:  [97.5 F (36.4 C)-98.7 F (37.1 C)] 97.5 F (36.4 C) (10/13 0359) Pulse Rate:  [62-87] 87 (10/13 0359) Resp:  [18-20] 20 (10/13 0359) BP: (111-200)/(55-149) 163/149 (10/13 0359) SpO2:  [94 %-98 %] 98 % (10/13 0359) Weight:  [98.8 kg] 98.8 kg (10/13 0359) Last BM Date : 06/25/22  Weight change: Filed Weights   06/25/22 0135 06/26/22 0302 06/27/22 0359  Weight: 97.1 kg 98.2 kg 98.8 kg    Intake/Output:   Intake/Output Summary (Last 24 hours) at 06/27/2022 0734 Last data filed at 06/27/2022 0500 Gross per 24 hour  Intake 964 ml  Output 950 ml  Net 14 ml      Physical Exam    General:  Elderly male sitting up on side of bed. HEENT: normal Neck: supple. Jvp ~ 10 cm. Carotids 2+ bilat; no bruits.  Cor: PMI nondisplaced. Regular rate & rhythm. No rubs, gallops or murmurs. Lungs: crackles in bases Abdomen: soft, nontender, nondistended, using abdominal muscles to breathe Extremities: no cyanosis, clubbing, rash, 1+ edema Neuro: alert & orientedx3, cranial nerves grossly intact. moves all 4 extremities w/o difficulty. Affect pleasant     Telemetry   SR 80s  Labs    CBC Recent Labs    06/25/22 0524 06/26/22 0321  WBC 10.9* 10.5  HGB 13.8 12.7*  HCT 41.1 37.3*  MCV 89.9 89.0  PLT 281 509   Basic Metabolic Panel Recent Labs     06/25/22 0524 06/26/22 0321 06/27/22 0417  NA 138 135 135  K 4.2 3.3* 3.8  CL 105 100 103  CO2 20* 24 23  GLUCOSE 115* 150* 166*  BUN 28* 28* 29*  CREATININE 2.32* 2.43* 2.31*  CALCIUM 8.6* 8.5* 8.3*  MG 2.1 1.9  --    Liver Function Tests No results for input(s): "AST", "ALT", "ALKPHOS", "BILITOT", "PROT", "ALBUMIN" in the last 72 hours.  No results for input(s): "LIPASE", "AMYLASE" in the last 72 hours. Cardiac Enzymes No results for input(s): "CKTOTAL", "CKMB", "CKMBINDEX", "TROPONINI" in the last 72 hours.  BNP: BNP (last 3 results) Recent Labs    06/06/22 0206 06/23/22 1752 06/24/22 0358  BNP 196.2* 3,165.5* 3,698.8*    ProBNP (last 3 results) No results for input(s): "PROBNP" in the last 8760 hours.   D-Dimer Recent Labs    06/24/22 1709  DDIMER 0.86*   Hemoglobin A1C No results for input(s): "HGBA1C" in the last 72 hours. Fasting Lipid Panel No results for input(s): "CHOL", "HDL", "LDLCALC", "TRIG", "CHOLHDL", "LDLDIRECT" in the last 72 hours. Thyroid Function Tests No results for input(s): "TSH", "T4TOTAL", "T3FREE", "THYROIDAB" in the last 72 hours.  Invalid input(s): "FREET3"  Other results:   Imaging    US RENAL  Result Date: 06/26/2022 CLINICAL DATA:  Acute kidney injury.  Inpatient. EXAM: RENAL /  URINARY TRACT ULTRASOUND COMPLETE COMPARISON:  05/23/2011 CT abdomen/pelvis. FINDINGS: Right Kidney: Renal measurements: 11.6 x 6.0 x 7.0 cm = volume: 252 mL. No hydronephrosis. No renal masses. Mildly echogenic renal parenchyma, normal thickness. Left Kidney: Renal measurements: 8.7 x 3.9 x 4.1 cm = volume: 73 mL. No hydronephrosis. Echogenic thin renal parenchyma. Simple left renal cysts, largest 3.0 x 3.1 x 3.0 cm in the lower left kidney. Bladder: Appears normal for degree of bladder distention. Other: None. IMPRESSION: 1. No hydronephrosis. 2. Asymmetric left renal atrophy. 3. Echogenic kidneys, compatible with nonspecific acute on chronic renal  parenchymal disease. 4. Simple left renal cysts, for which no follow-up imaging is recommended. 5. Normal bladder. Electronically Signed   By: Ilona Sorrel M.D.   On: 06/26/2022 12:15     Medications:     Scheduled Medications:  amiodarone  400 mg Oral BID   apixaban  2.5 mg Oral BID   atorvastatin  40 mg Oral Daily   carvedilol  12.5 mg Oral BID WC   Chlorhexidine Gluconate Cloth  6 each Topical Daily   hydrALAZINE  10 mg Oral Q8H   insulin aspart  0-5 Units Subcutaneous QHS   insulin aspart  0-6 Units Subcutaneous TID WC   levothyroxine  25 mcg Oral QAC breakfast   pantoprazole  40 mg Oral QODAY   sodium chloride flush  10-40 mL Intracatheter Q12H   sodium chloride flush  3 mL Intravenous Q12H    Infusions:  sodium chloride 20 mL/hr at 06/25/22 2230    PRN Medications: acetaminophen **OR** acetaminophen, hydrocortisone, ipratropium-albuterol, ondansetron **OR** [DISCONTINUED] ondansetron (ZOFRAN) IV, senna-docusate, sodium chloride flush    Patient Profile   81 y/o male w/ h/o chronic diastolic heart failure w/ recent drop in LVEF, 20-25%, cath w/ mild-mod CAD, PAF on Eliquis, HTN, Hypothyroidism, multiple re-admits for a/c CHF in the last year, readmitted w/ a/c CHF in setting of persistent atrial fibrillation.   Assessment/Plan   1. Acute on chronic systolic CHF: In setting of atrial fibrillation with mild RVR and initial uncontrolled HTN.  Echo in 8/23 showed EF 25-30% with normal RV, trivial MR, dilated IVC.  This was significantly lower than in the past (EF 50-55% in 3/23).  LHC in 9/23 showed nonobstructive CAD, unable to explain fall in EF. He was in atrial fibrillation in 8/23, ?tachy-mediated CMP.   - Became acutely dyspneic with marked hypertension overnight. Given 40 mg lasix IV with some initial improvement. SBP > 200 when I examined patient this am. Appeared more dyspneic at rest, + orthopnea and crackles in bases. Suspect recurrent flash pulmonary edema. Give 10  mg IV hydralazine once and 80 mg lasix IV. - Continue Coreg 12.5 mg bid.  - Off entresto, farxiga and spiro. Now hypertensive. Hold off on adding back entresto for now with concern for RAS. - Increase po hydralazine to 25 TID and add imdur 30 daily - Check BMET this afternoon - He is agreeable to paramedicine in the community 2. Atrial fibrillation: He appears to have been in atrial fibrillation (with RVR at times) since 8/23. Now back in SR with PACs. Continue amio 400 mg BID, reduce dose to 200 BID prior to discharge. - Continue apixaban 2.5 mg bid - Need to maintain NSR, will consider ablation down the road.  3. HTN/L RAS: Patient was admitted with hypertensive emergency/CHF.   He had aggressive initial BP control with transient hypotension.  Initially there was some concern about adherence with BP medications. - He's had  multiple admissions for hypertensive emergency and a/c CHF. Now with AKI. Note he had 99% L RAS on angiogram in 2021. Check renal artery duplex. Appears left kidney is atrophied on renal US. - May need to involve interventional cardiology 4. CAD: H/o PCI x 2 to RCA.  Cath in 8/23 after EF found to be low on echo did not explain his cardiomyopathy.   - Continue statin.  - He is on apixaban so do not think he needs to continue Plavix with no recent PCI.  5. PAD: He has severe right SFA and right EIA stenosis by 12/21 peripheral angiography.  Follows with Dr. Gwenlyn Found. No rest pain or pedal ulcerations.  6. AKI on CKD stage 3: Creatinine up to 2.4 from 1.57 on admit.  Scr 2.31 today. This may be due to significant renal artery stenosis +/- transient hypotension after aggressively lowering BP. - Diuretics and BP meds as above. - Renal US 10/12 with no hydronephrosis, left renal atrophy, echogenic kidneys consistent with acute on chronic renal parenchymal disease  ADDENDUM 11:08 am: BP significantly improved after IV hydralazine and IV lasix.   Cut back hydralazine to 10 TID and  hold off on adding imdur.  BMET this afternoon. Will need to monitor BP closely  Length of Stay: Monona, Monie Shere N, PA-C  06/27/2022, 7:34 AM    Rajah Lamba N 06/27/2022 7:34 AM  Advanced Heart Failure Team Pager [phone] (M-F; 7a - 5p)  Please contact [facility] for night-coverage after hours (5p -7a ) and weekends on [email or web address]

## 2022-06-28 DIAGNOSIS — I5023 Acute on chronic systolic (congestive) heart failure: Secondary | ICD-10-CM | POA: Diagnosis not present

## 2022-06-28 LAB — BASIC METABOLIC PANEL
Anion gap: 12 (ref 5–15)
BUN: 22 mg/dL (ref 8–23)
CO2: 23 mmol/L (ref 22–32)
Calcium: 8.4 mg/dL — ABNORMAL LOW (ref 8.9–10.3)
Chloride: 101 mmol/L (ref 98–111)
Creatinine, Ser: 1.83 mg/dL — ABNORMAL HIGH (ref 0.61–1.24)
GFR, Estimated: 37 mL/min — ABNORMAL LOW (ref >60)
Glucose, Bld: 156 mg/dL — ABNORMAL HIGH (ref 70–99)
Potassium: 3.3 mmol/L — ABNORMAL LOW (ref 3.5–5.1)
Sodium: 136 mmol/L (ref 135–145)

## 2022-06-28 LAB — GLUCOSE, CAPILLARY
Glucose-Capillary: 149 mg/dL — ABNORMAL HIGH (ref 70–99)
Glucose-Capillary: 166 mg/dL — ABNORMAL HIGH (ref 70–99)
Glucose-Capillary: 182 mg/dL — ABNORMAL HIGH (ref 70–99)
Glucose-Capillary: 167 mg/dL — ABNORMAL HIGH (ref 70–99)

## 2022-06-28 LAB — COOXEMETRY PANEL
Carboxyhemoglobin: 1.4 % (ref 0.5–1.5)
Methemoglobin: 1.2 % (ref 0.0–1.5)
O2 Saturation: 62.8 %
Total hemoglobin: 12.6 g/dL (ref 12.0–16.0)

## 2022-06-28 MED ORDER — HYDRALAZINE HCL 50 MG PO TABS
50.0000 mg | ORAL_TABLET | Freq: Three times a day (TID) | ORAL | Status: DC
  Administered 2022-06-28 – 2022-06-29 (×4): 50 mg via ORAL
  Filled 2022-06-28 (×4): qty 1

## 2022-06-28 MED ORDER — POTASSIUM CHLORIDE CRYS ER 20 MEQ PO TBCR
40.0000 meq | EXTENDED_RELEASE_TABLET | Freq: Once | ORAL | Status: AC
  Administered 2022-06-28: 40 meq via ORAL
  Filled 2022-06-28: qty 2

## 2022-06-28 NOTE — Progress Notes (Signed)
Ok to give KCL 58meq x1 for k+ 3.3 per Dr. Broadus John.  Onnie Boer, PharmD, BCIDP, AAHIVP, CPP Infectious Disease Pharmacist 06/28/2022 2:53 PM

## 2022-06-28 NOTE — Progress Notes (Addendum)
PROGRESS NOTE    DARNELLE CORP  PPI:[id number] DOB: 03-01-1941 DOA: 06/23/2022 PCP: [name], MD   81 y/o male w/ h/o chronic diastolic heart failure w/ recent drop in LVEF, 20-25%, cath w/ mild-mod CAD, PAF on Eliquis, HTN, Hypothyroidism, multiple patients with CHF, readmitted w/ a/c CHF in setting of persistent atrial fibrillation.  Improved on diuretics, advanced heart failure team following, recurrent flash pulm edema -complicated by AKI, workup pos for RAS and atrophic L kidney  Subjective: -feels well, breathing good, no events overnight, back in afib  Assessment and Plan:  Acute on chronic systolic CHF: -Recent echo 8/23 noted EF of 20-25% with normal RV function, considerably lower compared to echo earlier this year with a EF of 50-55% -Left heart cath in 9/23 noted nonobstructive CAD, A-fib was suspected to be the cause of his cardiomyopathy -Advanced heart failure team following, diuresis intermittently limited by hypotension -Creatinine improving, got iV lasix early yesterday -Continue Coreg, avoid ARB/ARN I with RAS -increase hydralazine, continue imdur -back in afib now -BMP in a.m.  Paroxysmal A-fib -Felt to be the cause of his recent cardiomyopathy -Now on p.o. amiodarone, continue apixaban -back in afib now, may need DCCV  AKI on CKD 3a Renal artery stenosis -Creatinine up to 2.48 from 1.5 this admission -Likely cardiorenal, hypotension likely contributing as well, concern for renal artery stenosis, had 99% left renal artery stenosis on angiogram in 2021,  left kidney is atrophic on ultrasound -duplex with mod R RAS -Creatinine improving, BMP in am  CAD -History of PCI to RCA in the past, cath in 9/23 noted nonobstructive CAD -Continue Coreg and statin   PAD:  -Continue statin and Eliquis  Type 2 diabetes mellitus -Hemoglobin A1c was 7.2 in August,, CBGs are stable, Farxiga on hold  Hypothyroidism -Continue  Synthroid  Hypokalemia -Replaced   DVT prophylaxis: Apixaban Code Status: DNR Family Communication:d/w pt,  no family at bedside Disposition Plan: Home likely 1-2days  Consultants: CHF team   Procedures:   Antimicrobials:    Objective: Vitals:   06/28/22 0000 06/28/22 0010 06/28/22 0406 06/28/22 0428  BP: (!) 165/75  (!) 180/83   Pulse: 71 72 73   Resp: 20  20   Temp: 97.8 F (36.6 C)  98.3 F (36.8 C)   TempSrc: Oral  Oral   SpO2: 96% 93% 95%   Weight:    98.2 kg  Height:        Intake/Output Summary (Last 24 hours) at 06/28/2022 0759 Last data filed at 06/28/2022 0436 Gross per 24 hour  Intake 727 ml  Output 2400 ml  Net -1673 ml   Filed Weights   06/26/22 0302 06/27/22 0359 06/28/22 0428  Weight: 98.2 kg 98.8 kg 98.2 kg    Examination:  General exam:   Gen: Awake, Alert, Oriented X 3,  HEENT: no JVD Lungs: Good air movement bilaterally, CTAB CVS: S1S2/RRR Abd: soft, Non tender, non distended, BS present Extremities: No edema Skin: no new rashes on exposed skin  Psychiatry:  Mood & affect appropriate.     Data Reviewed:   CBC: Recent Labs  Lab 06/23/22 1752 06/24/22 0358 06/25/22 0524 06/26/22 0321  WBC 9.5 8.4 10.9* 10.5  NEUTROABS 7.3  --   --   --   HGB 14.0 13.3 13.8 12.7*  HCT 42.4 39.0 41.1 37.3*  MCV 91.6 88.8 89.9 89.0  PLT 307 311 281 283   Basic Metabolic Panel: Recent Labs  Lab 06/23/22 1752 06/24/22 0358 06/25/22 0524  06/26/22 0321 06/27/22 0417 06/27/22 1436 06/28/22 0428  NA 137 138 138 135 135 137 136  K 3.0* 3.0* 4.2 3.3* 3.8 3.5 3.3*  CL 98 99 105 100 103 105 101  CO2 26 24 20* 24 23 24 23   GLUCOSE 182* 143* 115* 150* 166* 258* 156*  BUN 15 15 28* 28* 29* 27* 22  CREATININE 1.57* 1.86* 2.32* 2.43* 2.31* 2.21* 1.83*  CALCIUM 8.8* 8.7* 8.6* 8.5* 8.3* 7.9* 8.4*  MG 2.1 2.0 2.1 1.9  --   --   --    GFR: Estimated Creatinine Clearance: 37.2 mL/min (A) (by C-G formula based on SCr of 1.83 mg/dL (H)). Liver  Function Tests: Recent Labs  Lab 06/23/22 1752  AST 23  ALT 34  ALKPHOS 73  BILITOT 0.7  PROT 6.5  ALBUMIN 3.3*   No results for input(s): "LIPASE", "AMYLASE" in the last 168 hours. No results for input(s): "AMMONIA" in the last 168 hours. Coagulation Profile: Recent Labs  Lab 06/24/22 1709  INR 1.4*   Cardiac Enzymes: No results for input(s): "CKTOTAL", "CKMB", "CKMBINDEX", "TROPONINI" in the last 168 hours. BNP (last 3 results) No results for input(s): "PROBNP" in the last 8760 hours. HbA1C: No results for input(s): "HGBA1C" in the last 72 hours. CBG: Recent Labs  Lab 06/27/22 0625 06/27/22 1110 06/27/22 1528 06/27/22 2059 06/28/22 0619  GLUCAP 153* 158* 207* 165* 149*   Lipid Profile: No results for input(s): "CHOL", "HDL", "LDLCALC", "TRIG", "CHOLHDL", "LDLDIRECT" in the last 72 hours. Thyroid Function Tests: No results for input(s): "TSH", "T4TOTAL", "FREET4", "T3FREE", "THYROIDAB" in the last 72 hours. Anemia Panel: No results for input(s): "VITAMINB12", "FOLATE", "FERRITIN", "TIBC", "IRON", "RETICCTPCT" in the last 72 hours. Urine analysis:    Component Value Date/Time   COLORURINE YELLOW 01/01/2021 1351   APPEARANCEUR CLEAR 01/01/2021 1351   LABSPEC 1.009 01/01/2021 1351   PHURINE 6.0 01/01/2021 1351   GLUCOSEU >=500 (A) 01/01/2021 1351   HGBUR SMALL (A) 01/01/2021 1351   BILIRUBINUR NEGATIVE 01/01/2021 1351   KETONESUR NEGATIVE 01/01/2021 1351   PROTEINUR >=300 (A) 01/01/2021 1351   UROBILINOGEN 1.0 05/31/2013 1432   NITRITE NEGATIVE 01/01/2021 1351   LEUKOCYTESUR NEGATIVE 01/01/2021 1351   Sepsis Labs: @LABRCNTIP (procalcitonin:4,lacticidven:4)  )No results found for this or any previous visit (from the past 240 hour(s)).   Radiology Studies: VAS 01/03/2021 RENAL ARTERY DUPLEX  Result Date: 06/27/2022 ABDOMINAL VISCERAL Patient Name:  CHRISTIA COAXUM  Date of Exam:   06/27/2022 Medical Rec #: [name]         Accession #:    06/29/2022 Date of  Birth: 1941-08-04         Patient Gender: M Patient Age:   35 years Exam Location:  [facility] Procedure:      VAS 96 RENAL ARTERY DUPLEX Referring Phys: [id number] LINDSAY NICOLE [facility] -------------------------------------------------------------------------------- Indications: Hypertension, history of left renal artery stenosis 99% High Risk Factors: Diabetes, coronary artery disease. Other Factors: Acute on chronic CHF. Limitations: Obesity and Respiratory interference- patient on O2 support, unable to withhold respirations. Comparison Study: 06-26-2022 Prior renal duplex showed atrophic left kidney with                   multiple cysts, largest >3cm                    99% Left renal artery stenosis on angiogram 2021. Performing Technologist: 08-26-2022 RDMS, RVT  Examination Guidelines: A complete evaluation includes B-mode imaging, spectral Doppler,  color Doppler, and power Doppler as needed of all accessible portions of each vessel. Bilateral testing is considered an integral part of a complete examination. Limited examinations for reoccurring indications may be performed as noted.  Duplex Findings: +------------------+--------+--------+-------+ Right Renal ArteryPSV cm/sEDV cm/sComment +------------------+--------+--------+-------+ Origin              249      21           +------------------+--------+--------+-------+ Proximal            223      33           +------------------+--------+--------+-------+ Mid                 191      35           +------------------+--------+--------+-------+ Distal              104      21           +------------------+--------+--------+-------+ +-----------------+--------+--------+---------------------------+ Left Renal ArteryPSV cm/sEDV cm/s          Comment           +-----------------+--------+--------+---------------------------+ Proximal                         Unable to obtain LRA signal  +-----------------+--------+--------+---------------------------+ +------------+--------+--------+----+-----------+--------+--------+------------+ Right KidneyPSV cm/sEDV cm/sRI  Left KidneyPSV cm/sEDV cm/sRI           +------------+--------+--------+----+-----------+--------+--------+------------+ Upper Pole  16      5       0.68Upper Pole                              +------------+--------+--------+----+-----------+--------+--------+------------+ Mid         56      9       0.                        No evidence                                                             of flow                                                                 within left                                                             kidney by  color                                                                   doppler      +------------+--------+--------+----+-----------+--------+--------+------------+ Lower Pole  30      8       0.73Lower Pole                              +------------+--------+--------+----+-----------+--------+--------+------------+ Hilar       76      14      0.82Hilar                                   +------------+--------+--------+----+-----------+--------+--------+------------+ +------------------+-----+------------------+----------------------------------+ Right Kidney           Left Kidney                                          +------------------+-----+------------------+----------------------------------+ RAR                    RAR                                                  +------------------+-----+------------------+----------------------------------+ RAR (manual)      3.23 RAR (manual)      Unable to obtain LRA signal,                                                history of 99% stenosis in 2021     +------------------+-----+------------------+----------------------------------+ Cortex                 Cortex                                               +------------------+-----+------------------+----------------------------------+ Cortex thickness       Corex thickness                                      +------------------+-----+------------------+----------------------------------+ Kidney length (cm)10.96Kidney length (cm)7.50                               +------------------+-----+------------------+----------------------------------+  Summary: Renal:  Right: Normal size right kidney. Elevated velocities suggest >60%        stenosis at the renal artery origin; however, in the setting        of left renal atrophy with no observable flow, velocities may        overestimate degree of stenosis due to compensation. Left:  Abnormal size for the left  kidney. Cyst(s) noted. Unable to        obtain left renal artery signal. No evidence of flow within        the left kidney by color doppler.  *See table(s) above for measurements and observations.  Diagnosing physician: Sherald Hess MD  Electronically signed by Sherald Hess MD on 06/27/2022 at 1:38:29 PM.    Final    US RENAL  Result Date: 06/26/2022 CLINICAL DATA:  Acute kidney injury.  Inpatient. EXAM: RENAL / URINARY TRACT ULTRASOUND COMPLETE COMPARISON:  05/23/2011 CT abdomen/pelvis. FINDINGS: Right Kidney: Renal measurements: 11.6 x 6.0 x 7.0 cm = volume: 252 mL. No hydronephrosis. No renal masses. Mildly echogenic renal parenchyma, normal thickness. Left Kidney: Renal measurements: 8.7 x 3.9 x 4.1 cm = volume: 73 mL. No hydronephrosis. Echogenic thin renal parenchyma. Simple left renal cysts, largest 3.0 x 3.1 x 3.0 cm in the lower left kidney. Bladder: Appears normal for degree of bladder distention. Other: None. IMPRESSION: 1. No hydronephrosis. 2. Asymmetric left renal atrophy. 3. Echogenic kidneys, compatible with nonspecific  acute on chronic renal parenchymal disease. 4. Simple left renal cysts, for which no follow-up imaging is recommended. 5. Normal bladder. Electronically Signed   By: Delbert Phenix M.D.   On: 06/26/2022 12:15     Scheduled Meds:  amiodarone  400 mg Oral BID   apixaban  2.5 mg Oral BID   atorvastatin  40 mg Oral Daily   carvedilol  12.5 mg Oral BID WC   Chlorhexidine Gluconate Cloth  6 each Topical Daily   hydrALAZINE  50 mg Oral TID   insulin aspart  0-5 Units Subcutaneous QHS   insulin aspart  0-6 Units Subcutaneous TID WC   isosorbide mononitrate  30 mg Oral Daily   levothyroxine  25 mcg Oral QAC breakfast   pantoprazole  40 mg Oral QODAY   sodium chloride flush  10-40 mL Intracatheter Q12H   sodium chloride flush  3 mL Intravenous Q12H   Continuous Infusions:     LOS: 5 days    Time spent:    Zannie Cove, MD [facility]   06/28/2022, 7:59 AM

## 2022-06-29 DIAGNOSIS — I5023 Acute on chronic systolic (congestive) heart failure: Secondary | ICD-10-CM | POA: Diagnosis not present

## 2022-06-29 LAB — GLUCOSE, CAPILLARY
Glucose-Capillary: 158 mg/dL — ABNORMAL HIGH (ref 70–99)
Glucose-Capillary: 206 mg/dL — ABNORMAL HIGH (ref 70–99)
Glucose-Capillary: 177 mg/dL — ABNORMAL HIGH (ref 70–99)
Glucose-Capillary: 223 mg/dL — ABNORMAL HIGH (ref 70–99)

## 2022-06-29 LAB — COOXEMETRY PANEL
Carboxyhemoglobin: 0.9 % (ref 0.5–1.5)
Methemoglobin: <0.7 % (ref 0.0–1.5)
O2 Saturation: 46.5 %
Total hemoglobin: 12.8 g/dL (ref 12.0–16.0)

## 2022-06-29 LAB — BASIC METABOLIC PANEL
Anion gap: 10 (ref 5–15)
BUN: 20 mg/dL (ref 8–23)
CO2: 23 mmol/L (ref 22–32)
Calcium: 8.9 mg/dL (ref 8.9–10.3)
Chloride: 103 mmol/L (ref 98–111)
Creatinine, Ser: 1.82 mg/dL — ABNORMAL HIGH (ref 0.61–1.24)
GFR, Estimated: 37 mL/min — ABNORMAL LOW (ref >60)
Glucose, Bld: 154 mg/dL — ABNORMAL HIGH (ref 70–99)
Potassium: 4.3 mmol/L (ref 3.5–5.1)
Sodium: 136 mmol/L (ref 135–145)

## 2022-06-29 MED ORDER — FUROSEMIDE 40 MG PO TABS
40.0000 mg | ORAL_TABLET | Freq: Every day | ORAL | Status: DC
  Administered 2022-06-29: 40 mg via ORAL
  Filled 2022-06-29 (×2): qty 1

## 2022-06-29 MED ORDER — ISOSORBIDE MONONITRATE ER 60 MG PO TB24
120.0000 mg | ORAL_TABLET | Freq: Every day | ORAL | Status: DC
  Administered 2022-06-30 – 2022-07-03 (×4): 120 mg via ORAL
  Filled 2022-06-29 (×4): qty 2

## 2022-06-29 MED ORDER — HYDRALAZINE HCL 50 MG PO TABS
75.0000 mg | ORAL_TABLET | Freq: Three times a day (TID) | ORAL | Status: DC
  Administered 2022-06-29 – 2022-06-30 (×3): 75 mg via ORAL
  Filled 2022-06-29 (×3): qty 1

## 2022-06-29 MED ORDER — FUROSEMIDE 10 MG/ML IJ SOLN
80.0000 mg | Freq: Once | INTRAMUSCULAR | Status: AC
  Administered 2022-06-29: 80 mg via INTRAVENOUS
  Filled 2022-06-29: qty 8

## 2022-06-29 MED ORDER — ISOSORBIDE MONONITRATE ER 60 MG PO TB24
60.0000 mg | ORAL_TABLET | Freq: Every day | ORAL | Status: DC
  Administered 2022-06-29: 60 mg via ORAL
  Filled 2022-06-29: qty 1

## 2022-06-29 NOTE — Progress Notes (Signed)
Progress Note  Patient Name: CORDELL GUERCIO Date of Encounter: 06/29/2022  Primary Cardiologist: Nanetta Batty, MD   Subjective   Overnight doing well.  When seeing AHF group 10/13 had Flash Pulmonary Edema Patient notes that he has a wife with dementia and needs to get back to her. Does not feel SOB; just had breathing treatment.  Inpatient Medications    Scheduled Meds:  amiodarone  400 mg Oral BID   apixaban  2.5 mg Oral BID   atorvastatin  40 mg Oral Daily   carvedilol  12.5 mg Oral BID WC   Chlorhexidine Gluconate Cloth  6 each Topical Daily   furosemide  40 mg Oral Daily   hydrALAZINE  50 mg Oral TID   insulin aspart  0-5 Units Subcutaneous QHS   insulin aspart  0-6 Units Subcutaneous TID WC   isosorbide mononitrate  60 mg Oral Daily   levothyroxine  25 mcg Oral QAC breakfast   pantoprazole  40 mg Oral QODAY   sodium chloride flush  10-40 mL Intracatheter Q12H   sodium chloride flush  3 mL Intravenous Q12H   Continuous Infusions:  PRN Meds: acetaminophen **OR** acetaminophen, hydrocortisone, ipratropium-albuterol, ondansetron **OR** [DISCONTINUED] ondansetron (ZOFRAN) IV, senna-docusate, sodium chloride flush   Vital Signs    Vitals:   06/29/22 0006 06/29/22 0155 06/29/22 0500 06/29/22 0807  BP: (!) 144/88  (!) 182/88 (!) 154/72  Pulse: 71  78 74  Resp: 20  18 20   Temp: 97.7 F (36.5 C)   98 F (36.7 C)  TempSrc: Oral  Oral Oral  SpO2:   100% 92%  Weight:  98.4 kg    Height:        Intake/Output Summary (Last 24 hours) at 06/29/2022 1037 Last data filed at 06/29/2022 0856 Gross per 24 hour  Intake 840 ml  Output 500 ml  Net 340 ml   Filed Weights   06/27/22 0359 06/28/22 0428 06/29/22 0155  Weight: 98.8 kg 98.2 kg 98.4 kg    Telemetry    SR - Personally Reviewed  Physical Exam   Gen: Mild distress  Cardiac: No Rubs or Gallops,  no murmur, RRR +2 radial pulses Respiratory: decreased breath in bases, normal effort, with mild  increase  respiratory rate GI: Soft, nontender, non-distended  MS: +1 bilateral pitting edema;  moves all extremities Integument: Skin feels warm Neuro:  At time of evaluation, alert and oriented to person/place/time/situation  Psych: Normal affect, patient feels [facility]   Labs    Chemistry Recent Labs  Lab 06/23/22 1752 06/24/22 0358 06/27/22 1436 06/28/22 0428 06/29/22 0447  NA 137   < > 137 136 136  K 3.0*   < > 3.5 3.3* 4.3  CL 98   < > 105 101 103  CO2 26   < > 24 23 23   GLUCOSE 182*   < > 258* 156* 154*  BUN 15   < > 27* 22 20  CREATININE 1.57*   < > 2.21* 1.83* 1.82*  CALCIUM 8.8*   < > 7.9* 8.4* 8.9  PROT 6.5  --   --   --   --   ALBUMIN 3.3*  --   --   --   --   AST 23  --   --   --   --   ALT 34  --   --   --   --   ALKPHOS 73  --   --   --   --  BILITOT 0.7  --   --   --   --   GFRNONAA 44*   < > 29* 37* 37*  ANIONGAP 13   < > 8 12 10    < > = values in this interval not displayed.     Hematology Recent Labs  Lab 06/24/22 0358 06/25/22 0524 06/26/22 0321  WBC 8.4 10.9* 10.5  RBC 4.39 4.57 4.19*  HGB 13.3 13.8 12.7*  HCT 39.0 41.1 37.3*  MCV 88.8 89.9 89.0  MCH 30.3 30.2 30.3  MCHC 34.1 33.6 34.0  RDW 13.2 13.6 13.4  PLT 311 281 283    Cardiac EnzymesNo results for input(s): "TROPONINI" in the last 168 hours. No results for input(s): "TROPIPOC" in the last 168 hours.   BNP Recent Labs  Lab 06/23/22 1752 06/24/22 0358  BNP 3,165.5* 3,698.8*     DDimer  Recent Labs  Lab 06/24/22 1709  DDIMER 0.86*     Radiology    No results found.  Patient Profile     80 y.o. male HF and syncope  Assessment & Plan     Acute on Chronic Combined HF Complicated with uncontrolled HTN and unilateral L RAS; syncope Complicated by AKI on CKD (now nearing ~ 1.8 baseline) - off ARNI, MRA and SGLT2i due to kidney dysfunction - Increase po hydralazine to 75 TID and imdur 120 daily  - agree with return of PO lasix - I worry he may have flashed again; low  treshhold for lasix 40 IV X1 if he does not put out with lasix he received today - given RAS, ACEi may be reasonable when stable creatine - if recurrent flash pulmonary edema, renal artery intervention may be reasonable (Dr. Gwenlyn Found)  Ppersistent AF with PACs - CHASDVASC 5 - Continue amio 400 mg BID, reduce dose to 200 mg at DC - Continue apixaban 2.5 mg bid  CAD and PAD - with prior RCA intervention  - Continue statin.   For questions or updates, please contact [facility] Please consult [email or web address] for contact info under Cardiology/STEMI.      Rudean Haskell, MD [address] [place], [place] [address] [phone]  10:37 AM

## 2022-06-29 NOTE — Progress Notes (Signed)
PROGRESS NOTE    Albert Powell  TKZ:[id number] DOB: 1941/08/25 DOA: 06/23/2022 PCP: Leeroy Cha, MD   81 y/o male w/ h/o chronic diastolic heart failure w/ recent drop in LVEF, 20-25%, cath w/ mild-mod CAD, PAF on Eliquis, HTN, Hypothyroidism, multiple patients with CHF, readmitted w/ a/c CHF in setting of persistent atrial fibrillation.  Improved on diuretics, advanced heart failure team following, recurrent flash pulm edema -complicated by AKI, workup pos for RAS and atrophic L kidney  Subjective: -feels well, breathing good, no events overnight, back in afib  Assessment and Plan:  Acute on chronic systolic CHF: -Recent echo 8/23 noted EF of 20-25% with normal RV function, considerably lower compared to echo earlier this year with a EF of 50-55% -Left heart cath in 9/23 noted nonobstructive CAD, A-fib was suspected to be the cause of his cardiomyopathy -Advanced heart failure team following, -Creatinine worsened, now plateauing at 1.8 -Continue Coreg, avoid ARB/ARN I with RAS -Increased hydralazine dose yesterday, increase Imdur to 60 Mg daily -Discussed with cards, restart Po Lasix -Back in sinus rhythm this morning -BMP in a.m.  Paroxysmal A-fib -Felt to be the cause of his recent cardiomyopathy -Now on p.o. amiodarone, continue apixaban  AKI on CKD 3a Renal artery stenosis -Creatinine up to 2.48 from 1.5 this admission -Likely cardiorenal, hypotension likely contributing as well, concern for renal artery stenosis, had 99% left renal artery stenosis on angiogram in 2021,  left kidney is atrophic on ultrasound -duplex with mod R RAS -Creatinine stable today at 1.8, BMP in am  CAD -History of PCI to RCA in the past, cath in 9/23 noted nonobstructive CAD -Continue Coreg and statin   PAD:  -Continue statin and Eliquis  Type 2 diabetes mellitus -Hemoglobin A1c was 7.2 in August,, CBGs are stable, Farxiga on hold  Hypothyroidism -Continue  Synthroid  Hypokalemia -Replaced   DVT prophylaxis: Apixaban Code Status: DNR Family Communication:d/w pt,  no family at bedside Disposition Plan: Home later today or in a.m.  Consultants: CHF team   Procedures:   Antimicrobials:    Objective: Vitals:   06/29/22 0500 06/29/22 0807 06/29/22 1103 06/29/22 1105  BP: (!) 182/88 (!) 154/72 (!) 166/99   Pulse: 78 74 77   Resp: 18 20 20    Temp:  98 F (36.7 C) (!) 97.4 F (36.3 C)   TempSrc: Oral Oral Oral   SpO2: 100% 92% 92% 100%  Weight:      Height:        Intake/Output Summary (Last 24 hours) at 06/29/2022 1132 Last data filed at 06/29/2022 1102 Gross per 24 hour  Intake 960 ml  Output 500 ml  Net 460 ml   Filed Weights   06/27/22 0359 06/28/22 0428 06/29/22 0155  Weight: 98.8 kg 98.2 kg 98.4 kg    Examination:  General exam:   Gen: Obese pleasant male sitting up in bed, AAOx3, no distress HEENT: Neck obese unable to assess JVD CVS: S1-S2, regular rhythm Lungs: Decreased breath sounds at bases Abdomen: Soft, nontender, bowel sounds present  EXTR: Trace edema Skin: no new rashes on exposed skin  Psychiatry:  Mood & affect appropriate.     Data Reviewed:   CBC: Recent Labs  Lab 06/23/22 1752 06/24/22 0358 06/25/22 0524 06/26/22 0321  WBC 9.5 8.4 10.9* 10.5  NEUTROABS 7.3  --   --   --   HGB 14.0 13.3 13.8 12.7*  HCT 42.4 39.0 41.1 37.3*  MCV 91.6 88.8 89.9 89.0  PLT 307 311  281 [id number]   Basic Metabolic Panel: Recent Labs  Lab 06/23/22 1752 06/24/22 0358 06/25/22 0524 06/26/22 0321 06/27/22 0417 06/27/22 1436 06/28/22 0428 06/29/22 0447  NA 137 138 138 135 135 137 136 136  K 3.0* 3.0* 4.2 3.3* 3.8 3.5 3.3* 4.3  CL 98 99 105 100 103 105 101 103  CO2 26 24 20* 24 23 24 23 23   GLUCOSE 182* 143* 115* 150* 166* 258* 156* 154*  BUN 15 15 28* 28* 29* 27* 22 20  CREATININE 1.57* 1.86* 2.32* 2.43* 2.31* 2.21* 1.83* 1.82*  CALCIUM 8.8* 8.7* 8.6* 8.5* 8.3* 7.9* 8.4* 8.9  MG 2.1 2.0 2.1 1.9   --   --   --   --    GFR: Estimated Creatinine Clearance: 37.5 mL/min (A) (by C-G formula based on SCr of 1.82 mg/dL (H)). Liver Function Tests: Recent Labs  Lab 06/23/22 1752  AST 23  ALT 34  ALKPHOS 73  BILITOT 0.7  PROT 6.5  ALBUMIN 3.3*   No results for input(s): "LIPASE", "AMYLASE" in the last 168 hours. No results for input(s): "AMMONIA" in the last 168 hours. Coagulation Profile: Recent Labs  Lab 06/24/22 1709  INR 1.4*   Cardiac Enzymes: No results for input(s): "CKTOTAL", "CKMB", "CKMBINDEX", "TROPONINI" in the last 168 hours. BNP (last 3 results) No results for input(s): "PROBNP" in the last 8760 hours. HbA1C: No results for input(s): "HGBA1C" in the last 72 hours. CBG: Recent Labs  Lab 06/28/22 1208 06/28/22 1657 06/28/22 2102 06/29/22 0605 06/29/22 1128  GLUCAP 166* 182* 167* 158* 206*   Lipid Profile: No results for input(s): "CHOL", "HDL", "LDLCALC", "TRIG", "CHOLHDL", "LDLDIRECT" in the last 72 hours. Thyroid Function Tests: No results for input(s): "TSH", "T4TOTAL", "FREET4", "T3FREE", "THYROIDAB" in the last 72 hours. Anemia Panel: No results for input(s): "VITAMINB12", "FOLATE", "FERRITIN", "TIBC", "IRON", "RETICCTPCT" in the last 72 hours. Urine analysis:    Component Value Date/Time   COLORURINE YELLOW 01/01/2021 1351   APPEARANCEUR CLEAR 01/01/2021 1351   LABSPEC 1.009 01/01/2021 1351   PHURINE 6.0 01/01/2021 1351   GLUCOSEU >=500 (A) 01/01/2021 1351   HGBUR SMALL (A) 01/01/2021 1351   BILIRUBINUR NEGATIVE 01/01/2021 1351   KETONESUR NEGATIVE 01/01/2021 1351   PROTEINUR >=300 (A) 01/01/2021 1351   UROBILINOGEN 1.0 05/31/2013 1432   NITRITE NEGATIVE 01/01/2021 1351   LEUKOCYTESUR NEGATIVE 01/01/2021 1351   Sepsis Labs: @LABRCNTIP (procalcitonin:4,lacticidven:4)  )No results found for this or any previous visit (from the past 240 hour(s)).   Radiology Studies: No results found.   Scheduled Meds:  amiodarone  400 mg Oral BID    apixaban  2.5 mg Oral BID   atorvastatin  40 mg Oral Daily   carvedilol  12.5 mg Oral BID WC   Chlorhexidine Gluconate Cloth  6 each Topical Daily   furosemide  40 mg Oral Daily   hydrALAZINE  75 mg Oral TID   insulin aspart  0-5 Units Subcutaneous QHS   insulin aspart  0-6 Units Subcutaneous TID WC   [START ON 06/30/2022] isosorbide mononitrate  120 mg Oral Daily   levothyroxine  25 mcg Oral QAC breakfast   pantoprazole  40 mg Oral QODAY   sodium chloride flush  10-40 mL Intracatheter Q12H   sodium chloride flush  3 mL Intravenous Q12H   Continuous Infusions:     LOS: 6 days    Time spent: 54min    Domenic Polite, MD [facility]   06/29/2022, 11:32 AM

## 2022-06-30 DIAGNOSIS — I5023 Acute on chronic systolic (congestive) heart failure: Secondary | ICD-10-CM | POA: Diagnosis not present

## 2022-06-30 LAB — COOXEMETRY PANEL
Carboxyhemoglobin: 1.6 % — ABNORMAL HIGH (ref 0.5–1.5)
Methemoglobin: 0.7 % (ref 0.0–1.5)
O2 Saturation: 58.2 %
Total hemoglobin: 12 g/dL (ref 12.0–16.0)

## 2022-06-30 LAB — GLUCOSE, CAPILLARY
Glucose-Capillary: 176 mg/dL — ABNORMAL HIGH (ref 70–99)
Glucose-Capillary: 133 mg/dL — ABNORMAL HIGH (ref 70–99)
Glucose-Capillary: 266 mg/dL — ABNORMAL HIGH (ref 70–99)
Glucose-Capillary: 172 mg/dL — ABNORMAL HIGH (ref 70–99)

## 2022-06-30 LAB — BASIC METABOLIC PANEL
Anion gap: 13 (ref 5–15)
BUN: 24 mg/dL — ABNORMAL HIGH (ref 8–23)
CO2: 25 mmol/L (ref 22–32)
Calcium: 8.6 mg/dL — ABNORMAL LOW (ref 8.9–10.3)
Chloride: 100 mmol/L (ref 98–111)
Creatinine, Ser: 1.92 mg/dL — ABNORMAL HIGH (ref 0.61–1.24)
GFR, Estimated: 35 mL/min — ABNORMAL LOW (ref >60)
Glucose, Bld: 193 mg/dL — ABNORMAL HIGH (ref 70–99)
Potassium: 3.4 mmol/L — ABNORMAL LOW (ref 3.5–5.1)
Sodium: 138 mmol/L (ref 135–145)

## 2022-06-30 MED ORDER — FUROSEMIDE 10 MG/ML IJ SOLN
80.0000 mg | Freq: Two times a day (BID) | INTRAMUSCULAR | Status: AC
  Administered 2022-06-30 (×2): 80 mg via INTRAVENOUS
  Filled 2022-06-30 (×2): qty 8

## 2022-06-30 MED ORDER — SODIUM CHLORIDE 0.9% FLUSH: 3.0000 mL | Freq: Two times a day (BID) | INTRAVENOUS | Status: DC

## 2022-06-30 MED ORDER — HYDRALAZINE HCL 50 MG PO TABS
100.0000 mg | ORAL_TABLET | Freq: Three times a day (TID) | ORAL | Status: DC
  Administered 2022-06-30 – 2022-07-03 (×9): 100 mg via ORAL
  Filled 2022-06-30 (×9): qty 2

## 2022-06-30 MED ORDER — POTASSIUM CHLORIDE CRYS ER 20 MEQ PO TBCR
60.0000 meq | EXTENDED_RELEASE_TABLET | Freq: Once | ORAL | Status: AC
  Administered 2022-06-30: 60 meq via ORAL
  Filled 2022-06-30: qty 3

## 2022-06-30 MED ORDER — DAPAGLIFLOZIN PROPANEDIOL 10 MG PO TABS
10.0000 mg | ORAL_TABLET | Freq: Every day | ORAL | Status: DC
  Administered 2022-06-30 – 2022-07-03 (×4): 10 mg via ORAL
  Filled 2022-06-30 (×4): qty 1

## 2022-06-30 MED ORDER — CARVEDILOL 6.25 MG PO TABS
18.7500 mg | ORAL_TABLET | Freq: Two times a day (BID) | ORAL | Status: DC
  Administered 2022-06-30: 18.75 mg via ORAL
  Filled 2022-06-30: qty 1

## 2022-06-30 MED ORDER — POTASSIUM CHLORIDE CRYS ER 20 MEQ PO TBCR
40.0000 meq | EXTENDED_RELEASE_TABLET | Freq: Once | ORAL | Status: AC
  Administered 2022-06-30: 40 meq via ORAL
  Filled 2022-06-30: qty 2

## 2022-06-30 NOTE — Progress Notes (Signed)
Physical Therapy Treatment Patient Details Name: Albert Powell MRN: [id number] DOB: 1941-04-22 Today's Date: 06/30/2022   History of Present Illness Pt is an 81 y/o M admitted on 06/23/22 for treatment of acute on chronic systolic CHF. PMH: chronic diastolic heart failure with recent drop in LVEF 20-25%, cath with mild-mod CAD, PAF on eliquis, HTN, hypothyroidism    PT Comments    Pt with gradual progress. O2 sats stable on RA.  Ambulated 78' with IV pole and occasional use of furniture with labored gait and easily fatigued (improved with bil UE support).  Highly recommended use of RW or rollator at d/c for balance and energy conservation - pt verbalized understanding.   Also, updated recommendation to HHPT as pt is below his baseline.    Recommendations for follow up therapy are one component of a multi-disciplinary discharge planning process, led by the attending physician.  Recommendations may be updated based on patient status, additional functional criteria and insurance authorization.  Follow Up Recommendations  Home health PT     Assistance Recommended at Discharge Intermittent Supervision/Assistance  Patient can return home with the following A little help with walking and/or transfers;A little help with bathing/dressing/bathroom;Help with stairs or ramp for entrance   Equipment Recommendations  None recommended by PT    Recommendations for Other Services       Precautions / Restrictions Precautions Precautions: Fall Precaution Comments: watch HR and O2     Mobility  Bed Mobility Overal bed mobility: Modified Independent Bed Mobility: Supine to Sit     Supine to sit: Modified independent (Device/Increase time), HOB elevated Sit to supine: Modified independent (Device/Increase time)        Transfers Overall transfer level: Needs assistance Equipment used: None Transfers: Sit to/from Stand Sit to Stand: Supervision           General transfer comment:  Cues for safety    Ambulation/Gait Ambulation/Gait assistance: Min guard Gait Distance (Feet): 60 Feet Assistive device: IV Pole Gait Pattern/deviations: Step-to pattern, Decreased stride length, Wide base of support Gait velocity: decreased     General Gait Details: Pt declined use of RW but did push IV pole and intermittently reaching for furniture/sink.  Pt with wide BOS, labored gait, and significant lateral weight shifting with ambulation.  When pt able to have bil UE support (IV Pole and sink) - effort of gait significantly decreased. Educated pt on safety, balance, and energy conservation with use of RW - highly recommended using walker at d/c.   Stairs             Wheelchair Mobility    Modified Rankin (Stroke Patients Only)       Balance Overall balance assessment: Needs assistance Sitting-balance support: Feet supported Sitting balance-Leahy Scale: Good     Standing balance support: During functional activity, No upper extremity supported, Bilateral upper extremity supported, Single extremity supported Standing balance-Leahy Scale: Fair Standing balance comment: Able to static stand without support; ambulated with IV pole and occasional furniture                            Cognition Arousal/Alertness: Awake/alert Behavior During Therapy: WFL for tasks assessed/performed Overall Cognitive Status: Within Functional Limits for tasks assessed  Exercises      General Comments General comments (skin integrity, edema, etc.): Pt on RA with sats 94% during ambulation      Pertinent Vitals/Pain Pain Assessment Pain Assessment: No/denies pain    Home Living                          Prior Function            PT Goals (current goals can now be found in the care plan section) Progress towards PT goals: Progressing toward goals    Frequency    Min 3X/week      PT Plan  Discharge plan needs to be updated    Co-evaluation              AM-PAC PT "6 Clicks" Mobility   Outcome Measure  Help needed turning from your back to your side while in a flat bed without using bedrails?: None Help needed moving from lying on your back to sitting on the side of a flat bed without using bedrails?: None Help needed moving to and from a bed to a chair (including a wheelchair)?: A Little Help needed standing up from a chair using your arms (e.g., wheelchair or bedside chair)?: A Little Help needed to walk in hospital room?: A Little Help needed climbing 3-5 steps with a railing? : A Little 6 Click Score: 20    End of Session Equipment Utilized During Treatment: Gait belt Activity Tolerance: Patient limited by fatigue (easily fatigued and lunch arrived) Patient left: in bed;with call bell/phone within reach;with bed alarm set (sitting EOB) Nurse Communication: Mobility status PT Visit Diagnosis: Unsteadiness on feet (R26.81);Muscle weakness (generalized) (M62.81)     Time: 9323-5573 PT Time Calculation (min) (ACUTE ONLY): 12 min  Charges:  $Gait Training: 8-22 mins                     Albert Powell, PT Acute Rehab [facility] [phone]    Albert Powell 06/30/2022, 12:58 PM

## 2022-06-30 NOTE — Progress Notes (Signed)
PROGRESS NOTE    Albert Powell  GMW:[id number] DOB: 1941/02/26 DOA: 06/23/2022 PCP: [name], MD   81 y/o male w/ h/o chronic diastolic heart failure w/ recent drop in LVEF, 20-25%, cath w/ mild-mod CAD, PAF on Eliquis, HTN, Hypothyroidism, multiple patients with CHF, readmitted w/ a/c CHF in setting of persistent atrial fibrillation.  Improved on diuretics, advanced heart failure team following, recurrent flash pulm edema -complicated by AKI, workup pos for moderate right RAS and atrophic L kidney  Subjective: -feels well, breathing good, no events overnight, back in afib  Assessment and Plan:  Acute on chronic systolic CHF: -Recent echo 8/23 noted EF of 20-25% with normal RV function, considerably lower compared to echo earlier this year with a EF of 50-55% -Left heart cath in 9/23 noted nonobstructive CAD, A-fib was suspected to be the cause of his cardiomyopathy -Advanced heart failure team following, -For repeat IV Lasix today -Creatinine now stable around 1.8-1.9 -Continue Coreg, avoid ARB/ARN I with RAS -Increased hydralazine and Imdur dose further, restarted Farxiga -Appears to be in A-fib versus PACs this morning -Plan for CardioMEMS on Wednesday  Paroxysmal A-fib -Felt to be the cause of his recent cardiomyopathy -Now on p.o. amiodarone, continue apixaban  AKI on CKD 3a Renal artery stenosis -Creatinine up to 2.48 from 1.5 this admission -Likely cardiorenal, hypotension likely contributing as well, concern for renal artery stenosis, had 99% left renal artery stenosis on angiogram in 2021,  left kidney is atrophic on ultrasound -duplex with mod R RAS -Creatinine now stable at 1.9, BMP in am -CHF team reviewed case with Dr. Allyson Sabal, right renal artery stenosis felt to be in 40-50% range plan for outpatient follow-up for consideration of CO2 angiography  CAD -History of PCI to RCA in the past, cath in 9/23 noted nonobstructive CAD -Continue Coreg and  statin   PAD:  -Continue statin and Eliquis  Type 2 diabetes mellitus -Hemoglobin A1c was 7.2 in August,, CBGs are stable, Farxiga on hold  Hypothyroidism -Continue Synthroid  Hypokalemia -Replaced   DVT prophylaxis: Apixaban Code Status: DNR Family Communication:d/w pt,  no family at bedside Disposition Plan: Home likely Wednesday  Consultants: CHF team   Procedures:   Antimicrobials:    Objective: Vitals:   06/29/22 1918 06/30/22 0039 06/30/22 0324 06/30/22 0812  BP: (!) 136/96 (!) 185/88 (!) 184/87 (!) 189/78  Pulse: 65 74 74 84  Resp: 19  (!) 22 (!) 21  Temp: 97.6 F (36.4 C)  97.8 F (36.6 C) 98.6 F (37 C)  TempSrc: Oral  Oral Oral  SpO2: 95% 96% 95% 97%  Weight:   98.1 kg   Height:        Intake/Output Summary (Last 24 hours) at 06/30/2022 0958 Last data filed at 06/30/2022 0854 Gross per 24 hour  Intake 597 ml  Output 1275 ml  Net -678 ml   Filed Weights   06/28/22 0428 06/29/22 0155 06/30/22 0324  Weight: 98.2 kg 98.4 kg 98.1 kg    Examination:  General exam:   Gen: Obese pleasant male sitting up in bed, AAOx3, no distress HEENT: Positive JVD CVS: S1-S2, no rhythm Lungs: Clear bilaterally Abdomen: Soft, nontender, bowel sounds present Extremities: Trace edema  Skin: no new rashes on exposed skin  Psychiatry:  Mood & affect appropriate.     Data Reviewed:   CBC: Recent Labs  Lab 06/23/22 1752 06/24/22 0358 06/25/22 0524 06/26/22 0321  WBC 9.5 8.4 10.9* 10.5  NEUTROABS 7.3  --   --   --  HGB 14.0 13.3 13.8 12.7*  HCT 42.4 39.0 41.1 37.3*  MCV 91.6 88.8 89.9 89.0  PLT 307 311 281 250   Basic Metabolic Panel: Recent Labs  Lab 06/23/22 1752 06/24/22 0358 06/25/22 0524 06/26/22 0321 06/27/22 0417 06/27/22 1436 06/28/22 0428 06/29/22 0447 06/30/22 0449  NA 137 138 138 135 135 137 136 136 138  K 3.0* 3.0* 4.2 3.3* 3.8 3.5 3.3* 4.3 3.4*  CL 98 99 105 100 103 105 101 103 100  CO2 26 24 20* 24 23 24 23 23 25    GLUCOSE 182* 143* 115* 150* 166* 258* 156* 154* 193*  BUN 15 15 28* 28* 29* 27* 22 20 24*  CREATININE 1.57* 1.86* 2.32* 2.43* 2.31* 2.21* 1.83* 1.82* 1.92*  CALCIUM 8.8* 8.7* 8.6* 8.5* 8.3* 7.9* 8.4* 8.9 8.6*  MG 2.1 2.0 2.1 1.9  --   --   --   --   --    GFR: Estimated Creatinine Clearance: 35.5 mL/min (A) (by C-G formula based on SCr of 1.92 mg/dL (H)). Liver Function Tests: Recent Labs  Lab 06/23/22 1752  AST 23  ALT 34  ALKPHOS 73  BILITOT 0.7  PROT 6.5  ALBUMIN 3.3*   No results for input(s): "LIPASE", "AMYLASE" in the last 168 hours. No results for input(s): "AMMONIA" in the last 168 hours. Coagulation Profile: Recent Labs  Lab 06/24/22 1709  INR 1.4*   Cardiac Enzymes: No results for input(s): "CKTOTAL", "CKMB", "CKMBINDEX", "TROPONINI" in the last 168 hours. BNP (last 3 results) No results for input(s): "PROBNP" in the last 8760 hours. HbA1C: No results for input(s): "HGBA1C" in the last 72 hours. CBG: Recent Labs  Lab 06/29/22 0605 06/29/22 1128 06/29/22 1538 06/29/22 2109 06/30/22 0606  GLUCAP 158* 206* 177* 223* 176*   Lipid Profile: No results for input(s): "CHOL", "HDL", "LDLCALC", "TRIG", "CHOLHDL", "LDLDIRECT" in the last 72 hours. Thyroid Function Tests: No results for input(s): "TSH", "T4TOTAL", "FREET4", "T3FREE", "THYROIDAB" in the last 72 hours. Anemia Panel: No results for input(s): "VITAMINB12", "FOLATE", "FERRITIN", "TIBC", "IRON", "RETICCTPCT" in the last 72 hours. Urine analysis:    Component Value Date/Time   COLORURINE YELLOW 01/01/2021 1351   APPEARANCEUR CLEAR 01/01/2021 1351   LABSPEC 1.009 01/01/2021 1351   PHURINE 6.0 01/01/2021 1351   GLUCOSEU >=500 (A) 01/01/2021 1351   HGBUR SMALL (A) 01/01/2021 1351   BILIRUBINUR NEGATIVE 01/01/2021 1351   KETONESUR NEGATIVE 01/01/2021 1351   PROTEINUR >=300 (A) 01/01/2021 1351   UROBILINOGEN 1.0 05/31/2013 1432   NITRITE NEGATIVE 01/01/2021 1351   LEUKOCYTESUR NEGATIVE 01/01/2021  1351   Sepsis Labs: @LABRCNTIP (procalcitonin:4,lacticidven:4)  )No results found for this or any previous visit (from the past 240 hour(s)).   Radiology Studies: No results found.   Scheduled Meds:  amiodarone  400 mg Oral BID   apixaban  2.5 mg Oral BID   atorvastatin  40 mg Oral Daily   carvedilol  18.75 mg Oral BID WC   Chlorhexidine Gluconate Cloth  6 each Topical Daily   dapagliflozin propanediol  10 mg Oral Daily   furosemide  80 mg Intravenous BID   hydrALAZINE  100 mg Oral TID   insulin aspart  0-5 Units Subcutaneous QHS   insulin aspart  0-6 Units Subcutaneous TID WC   isosorbide mononitrate  120 mg Oral Daily   levothyroxine  25 mcg Oral QAC breakfast   pantoprazole  40 mg Oral QODAY   potassium chloride  40 mEq Oral Once   sodium chloride flush  10-40  mL Intracatheter Q12H   sodium chloride flush  3 mL Intravenous Q12H   sodium chloride flush  3 mL Intravenous Q12H   Continuous Infusions:     LOS: 7 days    Time spent:    Zannie Cove, MD [facility]   06/30/2022, 9:58 AM

## 2022-06-30 NOTE — Progress Notes (Addendum)
Advanced Heart Failure Rounding Note  PCP-Cardiologist: Quay Burow, MD   Subjective:    10/9 admitted w/ a/c CHF, pulmonary edema and hypertensive urgency, persistent afib. Diuresed, meds adjusted.  10/10 converted to NSR w/ PO amio. Lasix held given AKI   10/13 developed flash pulmonary edema. IV Lasix restarted. Renal US w/ atrophic lt kidney, likely secondary to chronic RAS. Renal artery doppler of the right kidney with mild-moderate stenosis  SCr 1.57>>1.86>>2.32>>2.43>>2.31>>1.83>>1.82>>1.92  K 3.4   BPs remain elevated, 829F systolic.  Rhythm analysis difficulty on tele, ? NSR. 12 lead EKG pending.     Objective:   Weight Range: 98.1 kg Body mass index is 31.93 kg/m.   Vital Signs:   Temp:  [97.4 F (36.3 C)-98.6 F (37 C)] 98.6 F (37 C) (10/16 0812) Pulse Rate:  [65-84] 84 (10/16 0812) Resp:  [19-22] 21 (10/16 0812) BP: (136-189)/(78-99) 189/78 (10/16 0812) SpO2:  [92 %-100 %] 97 % (10/16 0812) Weight:  [98.1 kg] 98.1 kg (10/16 0324) Last BM Date : 06/29/22  Weight change: Filed Weights   06/28/22 0428 06/29/22 0155 06/30/22 0324  Weight: 98.2 kg 98.4 kg 98.1 kg    Intake/Output:   Intake/Output Summary (Last 24 hours) at 06/30/2022 0840 Last data filed at 06/30/2022 0814 Gross per 24 hour  Intake 717 ml  Output 1375 ml  Net -658 ml      Physical Exam   General:  Well appearing. No respiratory difficulty HEENT: normal Neck: supple. JVD elevated. Carotids 2+ bilat; no bruits. No lymphadenopathy or thyromegaly appreciated. Cor: PMI nondisplaced. Regular rate & rhythm. No rubs, gallops or murmurs. Lungs: clear Abdomen: soft, nontender, nondistended. No hepatosplenomegaly. No bruits or masses. Good bowel sounds. Extremities: no cyanosis, clubbing, rash, trace b/l LE edema Neuro: alert & oriented x 3, cranial nerves grossly intact. moves all 4 extremities w/o difficulty. Affect pleasant.   Telemetry   ? Afib vs NSR w/ PACs, 12 lead  pending.   Labs    CBC No results for input(s): "WBC", "NEUTROABS", "HGB", "HCT", "MCV", "PLT" in the last 72 hours.  Basic Metabolic Panel Recent Labs    06/29/22 0447 06/30/22 0449  NA 136 138  K 4.3 3.4*  CL 103 100  CO2 23 25  GLUCOSE 154* 193*  BUN 20 24*  CREATININE 1.82* 1.92*  CALCIUM 8.9 8.6*   Liver Function Tests No results for input(s): "AST", "ALT", "ALKPHOS", "BILITOT", "PROT", "ALBUMIN" in the last 72 hours.  No results for input(s): "LIPASE", "AMYLASE" in the last 72 hours. Cardiac Enzymes No results for input(s): "CKTOTAL", "CKMB", "CKMBINDEX", "TROPONINI" in the last 72 hours.  BNP: BNP (last 3 results) Recent Labs    06/06/22 0206 06/23/22 1752 06/24/22 0358  BNP 196.2* 3,165.5* 3,698.8*    ProBNP (last 3 results) No results for input(s): "PROBNP" in the last 8760 hours.   D-Dimer No results for input(s): "DDIMER" in the last 72 hours.  Hemoglobin A1C No results for input(s): "HGBA1C" in the last 72 hours. Fasting Lipid Panel No results for input(s): "CHOL", "HDL", "LDLCALC", "TRIG", "CHOLHDL", "LDLDIRECT" in the last 72 hours. Thyroid Function Tests No results for input(s): "TSH", "T4TOTAL", "T3FREE", "THYROIDAB" in the last 72 hours.  Invalid input(s): "FREET3"  Other results:   Imaging    No results found.   Medications:     Scheduled Medications:  amiodarone  400 mg Oral BID   apixaban  2.5 mg Oral BID   atorvastatin  40 mg Oral Daily  carvedilol  12.5 mg Oral BID WC   Chlorhexidine Gluconate Cloth  6 each Topical Daily   furosemide  40 mg Oral Daily   hydrALAZINE  75 mg Oral TID   insulin aspart  0-5 Units Subcutaneous QHS   insulin aspart  0-6 Units Subcutaneous TID WC   isosorbide mononitrate  120 mg Oral Daily   levothyroxine  25 mcg Oral QAC breakfast   pantoprazole  40 mg Oral QODAY   sodium chloride flush  10-40 mL Intracatheter Q12H   sodium chloride flush  3 mL Intravenous Q12H    Infusions:    PRN  Medications: acetaminophen **OR** acetaminophen, hydrocortisone, ipratropium-albuterol, ondansetron **OR** [DISCONTINUED] ondansetron (ZOFRAN) IV, senna-docusate, sodium chloride flush    Patient Profile   81 y/o male w/ h/o chronic diastolic heart failure w/ recent drop in LVEF, 20-25%, cath w/ mild-mod CAD, PAF on Eliquis, HTN, Hypothyroidism, multiple re-admits for a/c CHF in the last year, readmitted w/ a/c CHF in setting of persistent atrial fibrillation.   Assessment/Plan   1. Acute on chronic systolic CHF: In setting of atrial fibrillation with mild RVR and initial uncontrolled HTN.  Echo in 8/23 showed EF 25-30% with normal RV, trivial MR, dilated IVC.  This was significantly lower than in the past (EF 50-55% in 3/23).  LHC in 9/23 showed nonobstructive CAD, unable to explain fall in EF. He was in atrial fibrillation in 8/23, ?tachy-mediated CMP. Diuresed w/ IV Lasix on admit, then developed AKI, likely due from development of hypotension after antihypertensive titration + over-diuresis. Lasix held 10/12. On 10/13 developed flash pulmonary edema. IV Lasix restarted. Remains fluid overloaded - Continue IV Lasix 80 mg bid x 2 doses today.  - Increase Coreg 18.75 mg bid.  - Off entresto and spiro w/ AKI and RAS. - Restart Farxiga 10 mg  - Increase po hydralazine to 100 TID  - Continue Imdur 120 daily - He is agreeable to paramedicine in the community - would also benefit from cardioMEMs for remote monitoring  2. Atrial fibrillation: He appears to have been in atrial fibrillation (with RVR at times) since 8/23. Now back in SR with PACs. Continue amio 400 mg BID, reduce dose to 200 BID prior to discharge. - Continue apixaban 2.5 mg bid - Need to maintain NSR, will consider ablation down the road.  3. HTN/L RAS: Patient was admitted with hypertensive emergency/CHF.   He had aggressive initial BP control with transient hypotension.  Initially there was some concern about adherence with BP  medications. - He's had multiple admissions for hypertensive emergency and a/c CHF. Now with AKI. Note he had 99% L RAS on angiogram in 2021. Appears left kidney is atrophied on renal US. - May need to involve interventional cardiology. Will d/w Dr. Gwenlyn Found  4. CAD: H/o PCI x 2 to RCA.  Cath in 8/23 after EF found to be low on echo did not explain his cardiomyopathy.   - Continue statin.  - He is on apixaban so do not think he needs to continue Plavix with no recent PCI.  5. PAD: He has severe right SFA and right EIA stenosis by 12/21 peripheral angiography.  Follows with Dr. Gwenlyn Found. No rest pain or pedal ulcerations.  6. AKI on CKD stage 3: Creatinine up to 2.4 from 1.57 on admit.  Scr 1.92 today. This may be due to significant renal artery stenosis +/- transient hypotension after aggressively lowering BP. - Diuretics and BP meds as above. - Renal US 10/12 with no  hydronephrosis, left renal atrophy, echogenic kidneys consistent with acute on chronic renal parenchymal disease   Length of Stay: [address], PA-C  06/30/2022, 8:40 AM    Lyda Jester 06/30/2022 8:40 AM  Advanced Heart Failure Team Pager [phone] (M-F; 7a - 5p)  Please contact [facility] for night-coverage after hours (5p -7a ) and weekends on [email or web address]  Patient seen with PA, agree with the above note.   He remains in NSR with frequent PACs.  BP remains high, SBP 180s.  CVP 13 on my read.  Mild dyspnea with exertion, mildly tachypneic this morning.   General: NAD Neck: JVP 12-14 cm, no thyromegaly or thyroid nodule.  Lungs: Clear to auscultation bilaterally with normal respiratory effort. CV: Nondisplaced PMI.  Heart regular S1/S2, no S3/S4, no murmur.  1+ edema 3/4 to knees bilaterally.  Abdomen: Soft, nontender, no hepatosplenomegaly, no distention.  Skin: Intact without lesions or rashes.  Neurologic: Alert and oriented x 3.  Psych: Normal affect. Extremities: No clubbing or cyanosis.  HEENT:  Normal.   Patient remains in NSR with frequent atrial ectopy.  Will continue current amiodarone for now + apixaban.  Need to keep in NSR, ?component of tachy-mediated CMP.   He remains volume overloaded by exam with CVP 13.  This is complicated by creatinine 1.9 (baseline seems to be around 1.5-1.6).  BP remains elevated.  - Will give Lasix 80 mg IV bid x 2 doses today.  - Increase Coreg to 18.75 mg bid.  - Increase hydralazine to 100 mg tid and continue Imdur 120 mg daily.  - Start on Farxiga 10 mg daily.  - No ACEI/ARB/ARNI with bilateral renal artery stenosis and elevated creatinine. May be able to use low dose spironolactone in future.  - Plan for Cardiomems placement on Wednesday for long-term volume status management.  Discussed risks/benefits with patient and he agrees to procedure.  Will need to hold Eliquis tomorrow.   Patient has occluded left renal artery with atrophic left kidney.  By renal artery dopplers, right renal artery with >60% stenosis.  Reviewed this with Dr Gwenlyn Found, however, and he thinks that the degree of stenosis is actually less, more in the 40-50% range.  Will need to follow closely as it is unclear how much renal artery stenosis is contributing to HTN/flash pulmonary edema episodes.  Will see Dr. Gwenlyn Found as outpatient, there will be consideration for CO2 angiography.   Loralie Champagne 06/30/2022 9:53 AM

## 2022-06-30 NOTE — Progress Notes (Signed)
   06/30/22 1000  Mobility  Activity Ambulated with assistance in hallway  Level of Assistance Modified independent, requires aide device or extra time  Assistive Device Other (Comment) (IV pole)  Distance Ambulated (ft) 20 ft  Activity Response Tolerated fair  Mobility Referral Yes  $Mobility charge 1 Mobility   Mobility Specialist Progress Note  During Mobility:  96% SpO2  Pt was in bed and agreeable. Mobility cut short d/t feeling SOB and fatigue. Returned to bed w/ all needs met and call bell in reach   Lucious Groves Mobility Specialist

## 2022-07-01 DIAGNOSIS — I5023 Acute on chronic systolic (congestive) heart failure: Secondary | ICD-10-CM | POA: Diagnosis not present

## 2022-07-01 LAB — BASIC METABOLIC PANEL
Anion gap: 11 (ref 5–15)
BUN: 19 mg/dL (ref 8–23)
CO2: 26 mmol/L (ref 22–32)
Calcium: 8.6 mg/dL — ABNORMAL LOW (ref 8.9–10.3)
Chloride: 102 mmol/L (ref 98–111)
Creatinine, Ser: 1.87 mg/dL — ABNORMAL HIGH (ref 0.61–1.24)
GFR, Estimated: 36 mL/min — ABNORMAL LOW (ref >60)
Glucose, Bld: 125 mg/dL — ABNORMAL HIGH (ref 70–99)
Potassium: 3.3 mmol/L — ABNORMAL LOW (ref 3.5–5.1)
Sodium: 139 mmol/L (ref 135–145)

## 2022-07-01 LAB — GLUCOSE, CAPILLARY
Glucose-Capillary: 127 mg/dL — ABNORMAL HIGH (ref 70–99)
Glucose-Capillary: 181 mg/dL — ABNORMAL HIGH (ref 70–99)
Glucose-Capillary: 145 mg/dL — ABNORMAL HIGH (ref 70–99)
Glucose-Capillary: 215 mg/dL — ABNORMAL HIGH (ref 70–99)

## 2022-07-01 LAB — COOXEMETRY PANEL
Carboxyhemoglobin: 1.6 % — ABNORMAL HIGH (ref 0.5–1.5)
Methemoglobin: 0.7 % (ref 0.0–1.5)
O2 Saturation: 65 %
Total hemoglobin: 12.2 g/dL (ref 12.0–16.0)

## 2022-07-01 LAB — MAGNESIUM: Magnesium: 1.7 mg/dL (ref 1.7–2.4)

## 2022-07-01 MED ORDER — POTASSIUM CHLORIDE CRYS ER 20 MEQ PO TBCR
40.0000 meq | EXTENDED_RELEASE_TABLET | Freq: Once | ORAL | Status: AC
  Administered 2022-07-01: 40 meq via ORAL
  Filled 2022-07-01: qty 2

## 2022-07-01 MED ORDER — CARVEDILOL 25 MG PO TABS
25.0000 mg | ORAL_TABLET | Freq: Two times a day (BID) | ORAL | Status: DC
  Administered 2022-07-01 – 2022-07-03 (×5): 25 mg via ORAL
  Filled 2022-07-01 (×5): qty 1

## 2022-07-01 MED ORDER — SPIRONOLACTONE 12.5 MG HALF TABLET
12.5000 mg | ORAL_TABLET | Freq: Every day | ORAL | Status: DC
  Administered 2022-07-01 – 2022-07-02 (×2): 12.5 mg via ORAL
  Filled 2022-07-01 (×2): qty 1

## 2022-07-01 MED ORDER — SODIUM CHLORIDE 0.9% FLUSH: 3.0000 mL | INTRAVENOUS | Status: DC | PRN

## 2022-07-01 MED ORDER — CLONIDINE HCL 0.2 MG PO TABS
0.2000 mg | ORAL_TABLET | ORAL | Status: DC | PRN
  Administered 2022-07-01: 0.2 mg via ORAL
  Filled 2022-07-01: qty 1

## 2022-07-01 MED ORDER — FUROSEMIDE 10 MG/ML IJ SOLN
80.0000 mg | Freq: Once | INTRAMUSCULAR | Status: AC
  Administered 2022-07-01: 80 mg via INTRAVENOUS
  Filled 2022-07-01: qty 8

## 2022-07-01 MED ORDER — SODIUM CHLORIDE 0.9 % IV SOLN: INTRAVENOUS | Status: DC

## 2022-07-01 MED ORDER — ASPIRIN 81 MG PO CHEW
81.0000 mg | CHEWABLE_TABLET | ORAL | Status: AC
  Administered 2022-07-02: 81 mg via ORAL
  Filled 2022-07-01: qty 1

## 2022-07-01 MED ORDER — MAGNESIUM SULFATE 2 GM/50ML IV SOLN
2.0000 g | Freq: Once | INTRAVENOUS | Status: AC
  Administered 2022-07-01: 2 g via INTRAVENOUS
  Filled 2022-07-01: qty 50

## 2022-07-01 MED ORDER — SODIUM CHLORIDE 0.9 % IV SOLN: 250.0000 mL | INTRAVENOUS | Status: DC | PRN

## 2022-07-01 NOTE — TOC Initial Note (Signed)
Transition of Care [facility]) - Initial/Assessment Note    Patient Details  Name: Albert Powell MRN: [id number] Date of Birth: [birth year]  Transition of Care [facility]) CM/SW Contact:    Erenest Rasher, RN Phone Number: [phone] 07/01/2022, 1:51 PM  Clinical Narrative:                  HF TOC CM spoke to pt at bedside. States he used [employer] in the past for [place]. Contacted [employer] rep, Caryl Pina with new referral. Pt may need oxygen for home. Will need ambulating oxygen saturation.     Expected Discharge Plan: [place] Barriers to Discharge: Continued Medical Work up   Patient Goals and CMS Choice Patient states their goals for this hospitalization and ongoing recovery are:: wants to remain independent   Choice offered to / list presented to : Patient  Expected Discharge Plan and Services Expected Discharge Plan: [place]   Discharge Planning Services: CM Consult Post Acute Care Choice: [place] arrangements for the past 2 months: Single Family Home                           HH Arranged: RN [place] Agency: [facility] ([place]) Date [place]: 07/01/22 Time HH Agency Contacted: 1351 Representative spoke with at [place]: Caryl Pina  Prior Living Arrangements/Services Living arrangements for the past 2 months: [place] with:: Spouse Patient language and need for interpreter reviewed:: Yes Do you feel safe going back to the place where you live?: Yes      Need for Family Participation in Patient Care: No (Comment) Care giver support system in place?: Yes (comment) Current home services: DME (rolling walker, cane, shower seat, glucometer, scale) Criminal Activity/Legal Involvement Pertinent to Current Situation/Hospitalization: No - Comment as needed  Activities of Daily Living Home Assistive Devices/Equipment: Walker (specify type) ADL Screening (condition at time of  admission) Patient's cognitive ability adequate to safely complete daily activities?: Yes Is the patient deaf or have difficulty hearing?: No Does the patient have difficulty seeing, even when wearing glasses/contacts?: No Does the patient have difficulty concentrating, remembering, or making decisions?: No Patient able to express need for assistance with ADLs?: Yes Does the patient have difficulty dressing or bathing?: No Independently performs ADLs?: Yes (appropriate for developmental age) Does the patient have difficulty walking or climbing stairs?: Yes Weakness of Legs: Both Weakness of Arms/Hands: None  Permission Sought/Granted Permission sought to share information with : Case Manager, Family Supports, PCP Permission granted to share information with : Yes, Verbal Permission Granted  Share Information with NAME: Albert Powell     Permission granted to share info w Relationship: wife  Permission granted to share info w Contact Information: [phone]  Emotional Assessment Appearance:: Appears stated age Attitude/Demeanor/Rapport: Engaged Affect (typically observed): Accepting Orientation: : Oriented to Self, Oriented to Place, Oriented to  Time, Oriented to Situation   Psych Involvement: No (comment)  Admission diagnosis:  Acute respiratory failure with hypoxia ([name]) [J96.01] Acute on chronic systolic CHF (congestive heart failure) ([name]) [I50.23] Acute on chronic congestive heart failure, unspecified heart failure type [facility]) [I50.9] Patient Active Problem List   Diagnosis Date Noted   Hypokalemia 06/23/2022   Elevated troponin 06/23/2022   Type 2 diabetes mellitus with hyperlipidemia ([place]) 05/19/2022   Acute combined systolic and diastolic heart failure ([name])    Acute kidney injury superimposed on chronic kidney disease (  [place]) 05/13/2022   GERD (gastroesophageal reflux disease) 05/13/2022   Acquired hypothyroidism 05/13/2022   Acute on chronic systolic CHF (congestive  heart failure) ([place]) 11/23/2021   PAF (paroxysmal atrial fibrillation) ([place]) 10/08/2021   Acute on chronic diastolic heart failure ([place]) 82/95/6213   Acute metabolic encephalopathy    CHF (congestive heart failure) ([place]) 03/10/2021   Uncontrolled hypertension 03/10/2021   Hypertensive crisis 01/01/2021   ACS (acute coronary syndrome) ([name])    AKI (acute kidney injury) ([place])    Atrial fibrillation ([place]) 03/30/2020   Contusion of right hand 11/12/2018   Primary osteoarthritis of both first carpometacarpal joints 11/12/2018   Pain in joint of right shoulder 10/05/2018   Pain in right hand 10/05/2018   Hematoma 03/30/2018   Degeneration of lumbar intervertebral disc 12/21/2017   Lumbar radiculopathy 12/21/2017   Vertigo 04/09/2017   Hypogonadism in male 12/07/2014   Screening for prostate cancer 12/07/2014   Claudication of right lower extremity ([place]) 10/20/2013   Claudication in peripheral vascular disease- Rt leg 09/20/2013   Obesity (BMI 30-39.9)- negative sleep study in the past 09/20/2013   Occlusion and stenosis of carotid artery without mention of cerebral infarction 05/23/2013   PAD (peripheral artery disease) ([place]) 05/18/2013   Carotid artery disease ([place]) 04/14/2013   Bradycardia, sinus 02/18/2013   Tobacco abuse 02/05/2013   Chest pain, unstable angina, negative MI 02/04/2013   Type 2 diabetes mellitus with stage 3b chronic kidney disease ([place]) 02/04/2013   Coronary artery disease involving native coronary artery of native heart without angina pectoris 02/04/2013   Benign essential hypertension 07/10/2011   Incisional hernia 05/22/2011   PCP:  Leeroy Cha, MD Pharmacy:   [facility] - [place], [place] - [address] [place] [address] Phone: [phone] Fax: [place] [address] Phone: [phone] Fax:  [phone]     Social Determinants of Health (SDOH) Interventions    Readmission Risk Interventions     No data to display

## 2022-07-01 NOTE — Progress Notes (Signed)
Patient ID: Albert Powell, male   DOB: October 26, 1940, 81 y.o.   MRN: [id number]     Advanced Heart Failure Rounding Note  PCP-Cardiologist: Quay Burow, MD   Subjective:    10/9 admitted w/ a/c CHF, pulmonary edema and hypertensive urgency, persistent afib. Diuresed, meds adjusted.  10/10 converted to NSR w/ PO amio. Lasix held given AKI   10/13 developed flash pulmonary edema. IV Lasix restarted. Renal US w/ atrophic lt kidney, likely secondary to chronic RAS. Renal artery doppler of the right kidney with mild-moderate stenosis  SCr 1.57>>1.86>>2.32>>2.43>>2.31>>1.83>>1.82>>1.92>>1.87  K 3.3   BPs remain elevated, generally [id number] systolic.   He remains in NSR with frequent atrial ectopy on amiodarone.    Good diuresis yesterday with IV Lasix, weight down 4 lbs.  CVP 8-9 cm.   Objective:   Weight Range: 96.2 kg Body mass index is 31.32 kg/m.   Vital Signs:   Temp:  [97.4 F (36.3 C)-98.7 F (37.1 C)] 97.9 F (36.6 C) (10/17 0714) Pulse Rate:  [60-84] 60 (10/17 0714) Resp:  [18-21] 18 (10/17 0714) BP: (143-203)/(57-94) 150/62 (10/17 0714) SpO2:  [92 %-99 %] 93 % (10/17 0714) Weight:  [96.2 kg] 96.2 kg (10/17 0353) Last BM Date : 06/30/22  Weight change: Filed Weights   06/29/22 0155 06/30/22 0324 07/01/22 0353  Weight: 98.4 kg 98.1 kg 96.2 kg    Intake/Output:   Intake/Output Summary (Last 24 hours) at 07/01/2022 0811 Last data filed at 07/01/2022 0500 Gross per 24 hour  Intake 1320 ml  Output 3450 ml  Net -2130 ml      Physical Exam   General: NAD Neck: JVP 8-9 cm, no thyromegaly or thyroid nodule.  Lungs: Clear to auscultation bilaterally with normal respiratory effort. CV: Nondisplaced PMI.  Heart regular S1/S2, no S3/S4, no murmur.  Trace ankle edema.  Abdomen: Soft, nontender, no hepatosplenomegaly, no distention.  Skin: Intact without lesions or rashes.  Neurologic: Alert and oriented x 3.  Psych: Normal affect. Extremities: No clubbing or  cyanosis.  HEENT: Normal.    Telemetry   NSR with atrial ectopy, personally reviewed   Labs    CBC No results for input(s): "WBC", "NEUTROABS", "HGB", "HCT", "MCV", "PLT" in the last 72 hours.  Basic Metabolic Panel Recent Labs    06/30/22 0449 07/01/22 0416  NA 138 139  K 3.4* 3.3*  CL 100 102  CO2 25 26  GLUCOSE 193* 125*  BUN 24* 19  CREATININE 1.92* 1.87*  CALCIUM 8.6* 8.6*  MG  --  1.7   Liver Function Tests No results for input(s): "AST", "ALT", "ALKPHOS", "BILITOT", "PROT", "ALBUMIN" in the last 72 hours.  No results for input(s): "LIPASE", "AMYLASE" in the last 72 hours. Cardiac Enzymes No results for input(s): "CKTOTAL", "CKMB", "CKMBINDEX", "TROPONINI" in the last 72 hours.  BNP: BNP (last 3 results) Recent Labs    06/06/22 0206 06/23/22 1752 06/24/22 0358  BNP 196.2* 3,165.5* 3,698.8*    ProBNP (last 3 results) No results for input(s): "PROBNP" in the last 8760 hours.   D-Dimer No results for input(s): "DDIMER" in the last 72 hours.  Hemoglobin A1C No results for input(s): "HGBA1C" in the last 72 hours. Fasting Lipid Panel No results for input(s): "CHOL", "HDL", "LDLCALC", "TRIG", "CHOLHDL", "LDLDIRECT" in the last 72 hours. Thyroid Function Tests No results for input(s): "TSH", "T4TOTAL", "T3FREE", "THYROIDAB" in the last 72 hours.  Invalid input(s): "FREET3"  Other results:   Imaging    No results found.   Medications:  Scheduled Medications:  amiodarone  400 mg Oral BID   atorvastatin  40 mg Oral Daily   carvedilol  25 mg Oral BID WC   Chlorhexidine Gluconate Cloth  6 each Topical Daily   dapagliflozin propanediol  10 mg Oral Daily   furosemide  80 mg Intravenous Once   hydrALAZINE  100 mg Oral TID   insulin aspart  0-5 Units Subcutaneous QHS   insulin aspart  0-6 Units Subcutaneous TID WC   isosorbide mononitrate  120 mg Oral Daily   levothyroxine  25 mcg Oral QAC breakfast   pantoprazole  40 mg Oral QODAY    potassium chloride  40 mEq Oral Once   potassium chloride  40 mEq Oral Once   sodium chloride flush  10-40 mL Intracatheter Q12H   sodium chloride flush  3 mL Intravenous Q12H   sodium chloride flush  3 mL Intravenous Q12H   spironolactone  12.5 mg Oral Daily    Infusions:  magnesium sulfate bolus IVPB       PRN Medications: acetaminophen **OR** acetaminophen, cloNIDine, hydrocortisone, ipratropium-albuterol, ondansetron **OR** [DISCONTINUED] ondansetron (ZOFRAN) IV, senna-docusate, sodium chloride flush    Patient Profile   81 y/o male w/ h/o chronic diastolic heart failure w/ recent drop in LVEF, 20-25%, cath w/ mild-mod CAD, PAF on Eliquis, HTN, Hypothyroidism, multiple re-admits for a/c CHF in the last year, readmitted w/ a/c CHF in setting of persistent atrial fibrillation.   Assessment/Plan   1. Acute on chronic systolic CHF: In setting of atrial fibrillation with mild RVR and initial uncontrolled HTN.  Echo in 8/23 showed EF 25-30% with normal RV, trivial MR, dilated IVC.  This was significantly lower than in the past (EF 50-55% in 3/23).  LHC in 9/23 showed nonobstructive CAD, unable to explain fall in EF. He was in atrial fibrillation in 8/23, ?tachy-mediated CMP. Diuresed w/ IV Lasix on admit, then developed AKI, likely due from development of hypotension after antihypertensive titration + over-diuresis. Lasix held 10/12. On 10/13 developed flash pulmonary edema. He diuresed well with IV Lasix yesterday with weight down 4 lbs, CVP 8-9 today.  Co-ox 65%.  - Give Lasix 80 mg IV x 1 today then likely to po diuretic (torsemide) tomorrow.  - Increase Coreg 25 mg bid.  - Off entresto w/ AKI and RAS. - Continue Farxiga 10 mg  - Continue po hydralazine 100 TID + Imdur 120 daily.  - Start spironolactone 12.5 mg daily today.  - He is agreeable to paramedicine in the community - I will arrange for Cardiomems placement tomorrow.  Discussed risks/benefits with patient and he agrees to  procedure.  2. Atrial fibrillation: He appears to have been in atrial fibrillation (with RVR at times) since 8/23. Now back in SR with PACs. Continue amio 400 mg BID, reduce dose to 200 BID prior to discharge. - apixaban 2.5 mg bid held today for Cardiomems, restart afterwards.  - Need to maintain NSR, will consider ablation down the road.  3. HTN/renal artery stenosis: Patient was admitted with hypertensive emergency/CHF.  Patient has occluded left renal artery with atrophic left kidney.  By renal artery dopplers, right renal artery with >60% stenosis.  Reviewed this with Dr Gwenlyn Found, however, and he thinks that the degree of stenosis is actually less, more in the 40-50% range.  Will need to follow closely as it is unclear how much renal artery stenosis is contributing to HTN/flash pulmonary edema episodes.   - Will see Dr. Gwenlyn Found as outpatient, there will be  consideration for CO2 angiography.  - Increase Coreg as above and add spironolactone.  4. CAD: H/o PCI x 2 to RCA.  Cath in 8/23 after EF found to be low on echo did not explain his cardiomyopathy.   - Continue statin.  - He is on apixaban so do not think he needs to continue Plavix with no recent PCI.  5. PAD: He has severe right SFA and right EIA stenosis by 12/21 peripheral angiography.  Follows with Dr. Gwenlyn Found. No rest pain or pedal ulcerations.  6. AKI on CKD stage 3: Creatinine up to 2.4 from 1.57 on admit.  Scr 1.92 => 1.87 today. This may be due to significant renal artery stenosis +/- transient hypotension after aggressively lowering BP.   Length of Stay: 8  Loralie Champagne, MD  07/01/2022, 8:11 AM    Loralie Champagne 07/01/2022 8:11 AM  Advanced Heart Failure Team Pager [phone] (M-F; 7a - 5p)  Please contact [facility] for night-coverage after hours (5p -7a ) and weekends on [email or web address]

## 2022-07-01 NOTE — Progress Notes (Addendum)
PROGRESS NOTE    Albert Powell  TIR:[id number] DOB: [birth year] DOA: 06/23/2022 PCP: [name], MD   81 y/o male w/ h/o chronic diastolic heart failure w/ recent drop in LVEF, 20-25%, cath w/ mild-mod CAD, PAF on Eliquis, HTN, Hypothyroidism, multiple patients with CHF, readmitted w/ a/c CHF in setting of persistent atrial fibrillation.  Improved on diuretics, advanced heart failure team following, recurrent flash pulm edema -complicated by AKI, workup pos for moderate right RAS and atrophic L kidney -Antihypertensives increased, intermittently diuresed  Subjective: -feels well, breathing good, no events overnight, back in afib  Assessment and Plan:  Acute on chronic systolic CHF: -Recent echo 8/23 noted EF of 20-25% with normal RV function, considerably lower compared to echo earlier this year with a EF of 50-55% -Left heart cath in 9/23 noted nonobstructive CAD, A-fib was suspected to be the cause of his cardiomyopathy -Advanced heart failure team following, -For repeat IV Lasix today, he is 3.9 L negative -Creatinine now stable around 1.8-1.9 -Continue Coreg, avoid ARB/ARNI with RAS -Increased hydralazine and Imdur dose further, restarted Farxiga -Intermittent A-fib this admission, now NSR with PACs -Plan for CardioMEMS on Wednesday  Paroxysmal A-fib -Felt to be the cause of his recent cardiomyopathy -Now on p.o. amiodarone, holding apixaban today for cardio mems  AKI on CKD 3a Renal artery stenosis -Creatinine up to 2.48 from 1.5 this admission -Likely cardiorenal, hypotension likely contributing as well, concern for renal artery stenosis, had 99% left renal artery stenosis on angiogram in 2021,  left kidney is atrophic on ultrasound -duplex with mod R RAS -Creatinine now stable at 1.9, BMP in am -CHF team reviewed case with Dr. Allyson Sabal, right renal artery stenosis felt to be in 40-50% range, plan for outpatient follow-up for consideration of CO2  angiography  CAD -History of PCI to RCA in the past, cath in 9/23 noted nonobstructive CAD -Continue Coreg and statin   PAD:  -Continue statin and Eliquis  Type 2 diabetes mellitus -Hemoglobin A1c was 7.2 in August,, CBGs are stable, Farxiga resumed  Hypothyroidism -Continue Synthroid  Hypokalemia -Replaced   DVT prophylaxis: Apixaban Code Status: DNR Family Communication:d/w pt,  no family at bedside, called and updated daughter yesterday Disposition Plan: Home likely Wednesday  Consultants: CHF team   Procedures:   Antimicrobials:    Objective: Vitals:   07/01/22 0009 07/01/22 0353 07/01/22 0430 07/01/22 0714  BP: (!) 158/76 (!) 188/81 (!) 203/94 (!) 150/62  Pulse: 68 71 66 60  Resp:   20 18  Temp: (!) 97.4 F (36.3 C) 97.8 F (36.6 C) 97.8 F (36.6 C) 97.9 F (36.6 C)  TempSrc: Oral Oral  Oral  SpO2: 96% 97% 99% 93%  Weight:  96.2 kg    Height:        Intake/Output Summary (Last 24 hours) at 07/01/2022 1006 Last data filed at 07/01/2022 0826 Gross per 24 hour  Intake 1440 ml  Output 3300 ml  Net -1860 ml   Filed Weights   06/29/22 0155 06/30/22 0324 07/01/22 0353  Weight: 98.4 kg 98.1 kg 96.2 kg    Examination:  General exam:   Gen: Obese pleasant male sitting up in bed, AAOx3, no distress HEENT: Positive JVD CVS: S1-S2, regular rhythm Lungs: Decreased breath sounds to bases otherwise clear Abdomen: Soft, nontender, bowel sounds present Extremities: Trace edema  Skin: no new rashes on exposed skin  Psychiatry:  Mood & affect appropriate.     Data Reviewed:   CBC: Recent Labs  Lab 06/25/22  2542 06/26/22 0321  WBC 10.9* 10.5  HGB 13.8 12.7*  HCT 41.1 37.3*  MCV 89.9 89.0  PLT 281 706   Basic Metabolic Panel: Recent Labs  Lab 06/25/22 0524 06/26/22 0321 06/27/22 0417 06/27/22 1436 06/28/22 0428 06/29/22 0447 06/30/22 0449 07/01/22 0416  NA 138 135   < > 137 136 136 138 139  K 4.2 3.3*   < > 3.5 3.3* 4.3 3.4* 3.3*   CL 105 100   < > 105 101 103 100 102  CO2 20* 24   < > 24 23 23 25 26   GLUCOSE 115* 150*   < > 258* 156* 154* 193* 125*  BUN 28* 28*   < > 27* 22 20 24* 19  CREATININE 2.32* 2.43*   < > 2.21* 1.83* 1.82* 1.92* 1.87*  CALCIUM 8.6* 8.5*   < > 7.9* 8.4* 8.9 8.6* 8.6*  MG 2.1 1.9  --   --   --   --   --  1.7   < > = values in this interval not displayed.   GFR: Estimated Creatinine Clearance: 36.1 mL/min (A) (by C-G formula based on SCr of 1.87 mg/dL (H)). Liver Function Tests: No results for input(s): "AST", "ALT", "ALKPHOS", "BILITOT", "PROT", "ALBUMIN" in the last 168 hours.  No results for input(s): "LIPASE", "AMYLASE" in the last 168 hours. No results for input(s): "AMMONIA" in the last 168 hours. Coagulation Profile: Recent Labs  Lab 06/24/22 1709  INR 1.4*   Cardiac Enzymes: No results for input(s): "CKTOTAL", "CKMB", "CKMBINDEX", "TROPONINI" in the last 168 hours. BNP (last 3 results) No results for input(s): "PROBNP" in the last 8760 hours. HbA1C: No results for input(s): "HGBA1C" in the last 72 hours. CBG: Recent Labs  Lab 06/30/22 0606 06/30/22 1157 06/30/22 1610 06/30/22 2115 07/01/22 0603  GLUCAP 176* 133* 266* 172* 127*   Lipid Profile: No results for input(s): "CHOL", "HDL", "LDLCALC", "TRIG", "CHOLHDL", "LDLDIRECT" in the last 72 hours. Thyroid Function Tests: No results for input(s): "TSH", "T4TOTAL", "FREET4", "T3FREE", "THYROIDAB" in the last 72 hours. Anemia Panel: No results for input(s): "VITAMINB12", "FOLATE", "FERRITIN", "TIBC", "IRON", "RETICCTPCT" in the last 72 hours. Urine analysis:    Component Value Date/Time   COLORURINE YELLOW 01/01/2021 1351   APPEARANCEUR CLEAR 01/01/2021 1351   LABSPEC 1.009 01/01/2021 1351   PHURINE 6.0 01/01/2021 1351   GLUCOSEU >=500 (A) 01/01/2021 1351   HGBUR SMALL (A) 01/01/2021 1351   BILIRUBINUR NEGATIVE 01/01/2021 1351   KETONESUR NEGATIVE 01/01/2021 1351   PROTEINUR >=300 (A) 01/01/2021 1351    UROBILINOGEN 1.0 05/31/2013 1432   NITRITE NEGATIVE 01/01/2021 1351   LEUKOCYTESUR NEGATIVE 01/01/2021 1351   Sepsis Labs: @LABRCNTIP (procalcitonin:4,lacticidven:4)  )No results found for this or any previous visit (from the past 240 hour(s)).   Radiology Studies: No results found.   Scheduled Meds:  amiodarone  400 mg Oral BID   atorvastatin  40 mg Oral Daily   carvedilol  25 mg Oral BID WC   Chlorhexidine Gluconate Cloth  6 each Topical Daily   dapagliflozin propanediol  10 mg Oral Daily   hydrALAZINE  100 mg Oral TID   insulin aspart  0-5 Units Subcutaneous QHS   insulin aspart  0-6 Units Subcutaneous TID WC   isosorbide mononitrate  120 mg Oral Daily   levothyroxine  25 mcg Oral QAC breakfast   pantoprazole  40 mg Oral QODAY   potassium chloride  40 mEq Oral Once   sodium chloride flush  10-40 mL Intracatheter  Q12H   sodium chloride flush  3 mL Intravenous Q12H   sodium chloride flush  3 mL Intravenous Q12H   spironolactone  12.5 mg Oral Daily   Continuous Infusions:     LOS: 8 days    Time spent:    Zannie Cove, MD [facility]   07/01/2022, 10:06 AM

## 2022-07-01 NOTE — Care Management Important Message (Signed)
Important Message  Patient Details  Name: Albert Powell MRN: [id number] Date of Birth: 01-26-1941   Medicare Important Message Given:  Yes     Shelda Altes 07/01/2022, 12:17 PM

## 2022-07-01 NOTE — Progress Notes (Signed)
CCMD notified that patients HR dropping down to 38. HR sustaining in the 40s. Patient asymptomatic  Notified MD. Ordered to hold PM dose of coreg.  Era Bumpers, RN

## 2022-07-02 ENCOUNTER — Encounter (HOSPITAL_COMMUNITY): Payer: Self-pay | Admitting: Cardiology

## 2022-07-02 ENCOUNTER — Encounter (HOSPITAL_COMMUNITY)
Admission: EM | Disposition: A | Discharge status: Home Health | Payer: Self-pay | Source: Home / Self Care | Attending: Internal Medicine

## 2022-07-02 DIAGNOSIS — E785 Hyperlipidemia, unspecified: Secondary | ICD-10-CM

## 2022-07-02 DIAGNOSIS — E1169 Type 2 diabetes mellitus with other specified complication: Secondary | ICD-10-CM

## 2022-07-02 DIAGNOSIS — I480 Paroxysmal atrial fibrillation: Secondary | ICD-10-CM | POA: Diagnosis not present

## 2022-07-02 DIAGNOSIS — N179 Acute kidney failure, unspecified: Secondary | ICD-10-CM | POA: Diagnosis not present

## 2022-07-02 DIAGNOSIS — I169 Hypertensive crisis, unspecified: Secondary | ICD-10-CM | POA: Diagnosis not present

## 2022-07-02 DIAGNOSIS — N189 Chronic kidney disease, unspecified: Secondary | ICD-10-CM

## 2022-07-02 DIAGNOSIS — I5023 Acute on chronic systolic (congestive) heart failure: Secondary | ICD-10-CM

## 2022-07-02 DIAGNOSIS — E669 Obesity, unspecified: Secondary | ICD-10-CM

## 2022-07-02 HISTORY — PX: PRESSURE SENSOR/CARDIOMEMS: CATH118258

## 2022-07-02 HISTORY — PX: RIGHT HEART CATH: CATH118263

## 2022-07-02 LAB — GLUCOSE, CAPILLARY
Glucose-Capillary: 115 mg/dL — ABNORMAL HIGH (ref 70–99)
Glucose-Capillary: 116 mg/dL — ABNORMAL HIGH (ref 70–99)
Glucose-Capillary: 117 mg/dL — ABNORMAL HIGH (ref 70–99)
Glucose-Capillary: 158 mg/dL — ABNORMAL HIGH (ref 70–99)
Glucose-Capillary: 164 mg/dL — ABNORMAL HIGH (ref 70–99)

## 2022-07-02 LAB — COOXEMETRY PANEL
Carboxyhemoglobin: 1.9 % — ABNORMAL HIGH (ref 0.5–1.5)
Methemoglobin: 0.9 % (ref 0.0–1.5)
O2 Saturation: 71.3 %
Total hemoglobin: 11.7 g/dL — ABNORMAL LOW (ref 12.0–16.0)

## 2022-07-02 LAB — POCT I-STAT EG7
Acid-Base Excess: 2 mmol/L (ref 0.0–2.0)
Acid-Base Excess: 4 mmol/L — ABNORMAL HIGH (ref 0.0–2.0)
Bicarbonate: 26.1 mmol/L (ref 20.0–28.0)
Bicarbonate: 28.4 mmol/L — ABNORMAL HIGH (ref 20.0–28.0)
Calcium, Ion: 1.19 mmol/L (ref 1.15–1.40)
Calcium, Ion: 1.2 mmol/L (ref 1.15–1.40)
HCT: 32 % — ABNORMAL LOW (ref 39.0–52.0)
HCT: 33 % — ABNORMAL LOW (ref 39.0–52.0)
Hemoglobin: 10.9 g/dL — ABNORMAL LOW (ref 13.0–17.0)
Hemoglobin: 11.2 g/dL — ABNORMAL LOW (ref 13.0–17.0)
O2 Saturation: 68 %
O2 Saturation: 69 %
Potassium: 3.9 mmol/L (ref 3.5–5.1)
Potassium: 3.9 mmol/L (ref 3.5–5.1)
Sodium: 139 mmol/L (ref 135–145)
Sodium: 139 mmol/L (ref 135–145)
TCO2: 27 mmol/L (ref 22–32)
TCO2: 30 mmol/L (ref 22–32)
pCO2, Ven: 39.5 mmHg — ABNORMAL LOW (ref 44–60)
pCO2, Ven: 40.9 mmHg — ABNORMAL LOW (ref 44–60)
pH, Ven: 7.429 (ref 7.25–7.43)
pH, Ven: 7.449 — ABNORMAL HIGH (ref 7.25–7.43)
pO2, Ven: 34 mmHg (ref 32–45)
pO2, Ven: 35 mmHg (ref 32–45)

## 2022-07-02 LAB — BASIC METABOLIC PANEL
Anion gap: 14 (ref 5–15)
BUN: 21 mg/dL (ref 8–23)
CO2: 25 mmol/L (ref 22–32)
Calcium: 9.5 mg/dL (ref 8.9–10.3)
Chloride: 101 mmol/L (ref 98–111)
Creatinine, Ser: 1.99 mg/dL — ABNORMAL HIGH (ref 0.61–1.24)
GFR, Estimated: 33 mL/min — ABNORMAL LOW (ref >60)
Glucose, Bld: 112 mg/dL — ABNORMAL HIGH (ref 70–99)
Potassium: 3.9 mmol/L (ref 3.5–5.1)
Sodium: 140 mmol/L (ref 135–145)

## 2022-07-02 SURGERY — PRESSURE SENSOR/CARDIOMEMS
Anesthesia: LOCAL

## 2022-07-02 MED ORDER — HYDRALAZINE HCL 20 MG/ML IJ SOLN: 10.0000 mg | INTRAMUSCULAR | Status: AC | PRN

## 2022-07-02 MED ORDER — MIDAZOLAM HCL 2 MG/2ML IJ SOLN
INTRAMUSCULAR | Status: DC | PRN
  Administered 2022-07-02: 1 mg via INTRAVENOUS

## 2022-07-02 MED ORDER — FENTANYL CITRATE (PF) 100 MCG/2ML IJ SOLN
INTRAMUSCULAR | Status: DC | PRN
  Administered 2022-07-02: 12.5 ug via INTRAVENOUS

## 2022-07-02 MED ORDER — AMIODARONE HCL 200 MG PO TABS
200.0000 mg | ORAL_TABLET | Freq: Two times a day (BID) | ORAL | Status: DC
  Administered 2022-07-02 – 2022-07-03 (×2): 200 mg via ORAL
  Filled 2022-07-02 (×2): qty 1

## 2022-07-02 MED ORDER — LIDOCAINE HCL (PF) 1 % IJ SOLN
INTRAMUSCULAR | Status: AC
  Filled 2022-07-02: qty 30

## 2022-07-02 MED ORDER — SODIUM CHLORIDE 0.9% FLUSH: 3.0000 mL | INTRAVENOUS | Status: DC | PRN

## 2022-07-02 MED ORDER — LABETALOL HCL 5 MG/ML IV SOLN: 10.0000 mg | INTRAVENOUS | Status: AC | PRN

## 2022-07-02 MED ORDER — ACETAMINOPHEN 325 MG PO TABS: 650.0000 mg | ORAL_TABLET | ORAL | Status: DC | PRN

## 2022-07-02 MED ORDER — APIXABAN 2.5 MG PO TABS
2.5000 mg | ORAL_TABLET | Freq: Two times a day (BID) | ORAL | Status: DC
  Administered 2022-07-03: 2.5 mg via ORAL
  Filled 2022-07-02: qty 1

## 2022-07-02 MED ORDER — TORSEMIDE 20 MG PO TABS
40.0000 mg | ORAL_TABLET | Freq: Every day | ORAL | Status: DC
  Administered 2022-07-02 – 2022-07-03 (×2): 40 mg via ORAL
  Filled 2022-07-02 (×2): qty 2

## 2022-07-02 MED ORDER — MIDAZOLAM HCL 2 MG/2ML IJ SOLN
INTRAMUSCULAR | Status: AC
  Filled 2022-07-02: qty 2

## 2022-07-02 MED ORDER — SPIRONOLACTONE 25 MG PO TABS
25.0000 mg | ORAL_TABLET | Freq: Every day | ORAL | Status: DC
  Administered 2022-07-03: 25 mg via ORAL
  Filled 2022-07-02: qty 1

## 2022-07-02 MED ORDER — SODIUM CHLORIDE 0.9% FLUSH
3.0000 mL | Freq: Two times a day (BID) | INTRAVENOUS | Status: DC
  Administered 2022-07-02: 3 mL via INTRAVENOUS

## 2022-07-02 MED ORDER — ONDANSETRON HCL 4 MG/2ML IJ SOLN: 4.0000 mg | Freq: Four times a day (QID) | INTRAMUSCULAR | Status: DC | PRN

## 2022-07-02 MED ORDER — INSULIN ASPART 100 UNIT/ML IJ SOLN
0.0000 [IU] | Freq: Three times a day (TID) | INTRAMUSCULAR | Status: DC
  Administered 2022-07-02 – 2022-07-03 (×2): 1 [IU] via SUBCUTANEOUS

## 2022-07-02 MED ORDER — HEPARIN (PORCINE) IN NACL 1000-0.9 UT/500ML-% IV SOLN
INTRAVENOUS | Status: DC | PRN
  Administered 2022-07-02: 500 mL

## 2022-07-02 MED ORDER — SODIUM CHLORIDE 0.9 % IV SOLN: 250.0000 mL | INTRAVENOUS | Status: DC | PRN

## 2022-07-02 MED ORDER — LEVOTHYROXINE SODIUM 25 MCG PO TABS
25.0000 ug | ORAL_TABLET | Freq: Every day | ORAL | Status: DC
  Administered 2022-07-03: 25 ug via ORAL
  Filled 2022-07-02: qty 1

## 2022-07-02 MED ORDER — HEPARIN (PORCINE) IN NACL 1000-0.9 UT/500ML-% IV SOLN
INTRAVENOUS | Status: AC
  Filled 2022-07-02: qty 500

## 2022-07-02 MED ORDER — LIDOCAINE HCL (PF) 1 % IJ SOLN
INTRAMUSCULAR | Status: DC | PRN
  Administered 2022-07-02: 15 mL

## 2022-07-02 MED ORDER — FENTANYL CITRATE (PF) 100 MCG/2ML IJ SOLN
INTRAMUSCULAR | Status: AC
  Filled 2022-07-02: qty 2

## 2022-07-02 MED ORDER — IOHEXOL 350 MG/ML SOLN
INTRAVENOUS | Status: DC | PRN
  Administered 2022-07-02: 2 mL

## 2022-07-02 SURGICAL SUPPLY — 14 items
CARDIOMEMS PA SENSOR W/DELIVER (Prosthesis & Implant Heart) ×1 IMPLANT
CATH SWAN GANZ 7F STRAIGHT (CATHETERS) IMPLANT
KIT HEART LEFT (KITS) ×1 IMPLANT
KIT MICROPUNCTURE NIT STIFF (SHEATH) IMPLANT
PACK CARDIAC CATHETERIZATION (CUSTOM PROCEDURE TRAY) ×1 IMPLANT
PROTECTION STATION PRESSURIZED (MISCELLANEOUS) ×1
SENSOR CARDIOMEMS PA W/DELIVER (Prosthesis & Implant Heart) IMPLANT
SHEATH FAST CATH 12F 12CM (SHEATH) IMPLANT
SHEATH PINNACLE 7F 10CM (SHEATH) IMPLANT
SHEATH PROBE COVER 6X72 (BAG) IMPLANT
STATION PROTECTION PRESSURIZED (MISCELLANEOUS) IMPLANT
TRANSDUCER W/STOPCOCK (MISCELLANEOUS) ×1 IMPLANT
WIRE EMERALD 3MM-J .035X150CM (WIRE) IMPLANT
WIRE NITREX .018X300 STIFF (WIRE) IMPLANT

## 2022-07-02 NOTE — Hospital Course (Signed)
[name] was admitted to the hospital with the working diagnosis of decompensated heart failure.   81 y/o male w/ h/o chronic diastolic heart failure w/ recent drop in LVEF, 20-25%, cath w/ mild-mod CAD, PAF on Eliquis, HTN, Hypothyroidism, and persistent atrial fibrillation. Patient had frequent hospitalizations for heart failure. He reported 2 to 3 days of worsening dyspnea that prompted him to come back to the hospital. On his initial physical examination his blood pressure was 190/144, HR 107, RR 23 and 02 saturation 94%, lungs with no wheezing, positive increased work of breathing, heart with S1 and S2 present, irregularly irregular with no gallops, abdomen not distended and no lower extremity edema.   Na 137, K 3,0 CL 98, bicarbonate 26, glucose 182, bun 15 cr 1,57  BNP 3,165 High sensitive troponin 64 and 66  Wbc 9,5 hgb 14.0 plt 307   Chest radiograph with mild cardiomegaly, bilateral hilar vascular congestion with bilateral interstitial infiltrates at the lower lobes, more on the right.   EKG 98 bpm, left axis deviation, not prolonged qtc, atrial fibrillation rhythm with no significant ST segment or T wave changes.   Improved on diuretics, advanced heart failure team following, recurrent flash pulm edema -complicated by AKI, workup pos for moderate right RAS and atrophic L kidney -Antihypertensives increased, intermittently diuresed  10/18 cardiomems placement.  10/19 patient will be discharge home with close follow up as outpatient.

## 2022-07-02 NOTE — Assessment & Plan Note (Signed)
Continue rate control with amiodarone and carvedilol  Anticoagulation with apixaban

## 2022-07-02 NOTE — H&P (View-Only) (Signed)
Patient ID: Albert Powell, male   DOB: 08-Oct-1940, 81 y.o.   MRN: [id number]     Advanced Heart Failure Rounding Note  PCP-Cardiologist: Quay Burow, MD   Subjective:   Admit weight 220 lbs -->212 today  10/9 admitted w/ a/c CHF, pulmonary edema and hypertensive urgency, persistent afib. Diuresed, meds adjusted.  10/10 converted to NSR w/ PO amio. Lasix held given AKI   10/13 developed flash pulmonary edema. IV Lasix restarted. Renal US w/ atrophic lt kidney, likely secondary to chronic RAS. Renal artery doppler of the right kidney with mild-moderate stenosis  Yesterday diuresed with IV lasix. Carvedilol increased to 25 mg twice a day and spiro added.   Lab Results  Component Value Date   CREATININE 1.99 (H) 07/02/2022   CREATININE 1.87 (H) 07/01/2022   CREATININE 1.92 (H) 06/30/2022      Denies SOB.     Objective:   Weight Range: 96.3 kg Body mass index is 31.37 kg/m.   Vital Signs:   Temp:  [97.6 F (36.4 C)-97.9 F (36.6 C)] 97.6 F (36.4 C) (10/18 0730) Pulse Rate:  [50-61] 61 (10/18 0730) Resp:  [18-20] 20 (10/18 0730) BP: (114-160)/(53-76) 152/76 (10/18 0730) SpO2:  [97 %-100 %] 97 % (10/18 0730) Weight:  [96.3 kg] 96.3 kg (10/18 0349) Last BM Date : 06/30/22  Weight change: Filed Weights   06/30/22 0324 07/01/22 0353 07/02/22 0349  Weight: 98.1 kg 96.2 kg 96.3 kg    Intake/Output:   Intake/Output Summary (Last 24 hours) at 07/02/2022 0805 Last data filed at 07/02/2022 0622 Gross per 24 hour  Intake 890.01 ml  Output 1600 ml  Net -709.99 ml     CVP 10 personally checked sitting on the side of the bed.  Physical Exam  General: Sitting on the side of the bed.  No resp difficulty HEENT: normal Neck: supple. JVP 9-10. Carotids 2+ bilat; no bruits. No lymphadenopathy or thryomegaly appreciated. Cor: PMI nondisplaced. Regular rate & rhythm. No rubs, gallops or murmurs. Lungs: clear Abdomen: soft, nontender, nondistended. No hepatosplenomegaly.  No bruits or masses. Good bowel sounds. Extremities: no cyanosis, clubbing, rash, edema. RUE PICC  Neuro: alert & orientedx3, cranial nerves grossly intact. moves all 4 extremities w/o difficulty. Affect pleasant  Telemetry  SR-SB 50-60s. One brief episode heart rate down to 30s Labs    CBC No results for input(s): "WBC", "NEUTROABS", "HGB", "HCT", "MCV", "PLT" in the last 72 hours.  Basic Metabolic Panel Recent Labs    07/01/22 0416 07/02/22 0411  NA 139 140  K 3.3* 3.9  CL 102 101  CO2 26 25  GLUCOSE 125* 112*  BUN 19 21  CREATININE 1.87* 1.99*  CALCIUM 8.6* 9.5  MG 1.7  --    Liver Function Tests No results for input(s): "AST", "ALT", "ALKPHOS", "BILITOT", "PROT", "ALBUMIN" in the last 72 hours.  No results for input(s): "LIPASE", "AMYLASE" in the last 72 hours. Cardiac Enzymes No results for input(s): "CKTOTAL", "CKMB", "CKMBINDEX", "TROPONINI" in the last 72 hours.  BNP: BNP (last 3 results) Recent Labs    06/06/22 0206 06/23/22 1752 06/24/22 0358  BNP 196.2* 3,165.5* 3,698.8*    ProBNP (last 3 results) No results for input(s): "PROBNP" in the last 8760 hours.   D-Dimer No results for input(s): "DDIMER" in the last 72 hours.  Hemoglobin A1C No results for input(s): "HGBA1C" in the last 72 hours. Fasting Lipid Panel No results for input(s): "CHOL", "HDL", "LDLCALC", "TRIG", "CHOLHDL", "LDLDIRECT" in the last 72 hours. Thyroid  Function Tests No results for input(s): "TSH", "T4TOTAL", "T3FREE", "THYROIDAB" in the last 72 hours.  Invalid input(s): "FREET3"  Other results:   Imaging    No results found.   Medications:     Scheduled Medications:  amiodarone  400 mg Oral BID   atorvastatin  40 mg Oral Daily   carvedilol  25 mg Oral BID WC   Chlorhexidine Gluconate Cloth  6 each Topical Daily   dapagliflozin propanediol  10 mg Oral Daily   hydrALAZINE  100 mg Oral TID   insulin aspart  0-5 Units Subcutaneous QHS   insulin aspart  0-6 Units  Subcutaneous TID WC   isosorbide mononitrate  120 mg Oral Daily   levothyroxine  25 mcg Oral QAC breakfast   pantoprazole  40 mg Oral QODAY   sodium chloride flush  10-40 mL Intracatheter Q12H   sodium chloride flush  3 mL Intravenous Q12H   sodium chloride flush  3 mL Intravenous Q12H   spironolactone  12.5 mg Oral Daily    Infusions:  sodium chloride     sodium chloride 10 mL/hr at 07/02/22 0622     PRN Medications: sodium chloride, acetaminophen **OR** acetaminophen, cloNIDine, hydrocortisone, ipratropium-albuterol, ondansetron **OR** [DISCONTINUED] ondansetron (ZOFRAN) IV, senna-docusate, sodium chloride flush, sodium chloride flush    Patient Profile   82 y/o male w/ h/o chronic diastolic heart failure w/ recent drop in LVEF, 20-25%, cath w/ mild-mod CAD, PAF on Eliquis, HTN, Hypothyroidism, multiple re-admits for a/c CHF in the last year, readmitted w/ a/c CHF in setting of persistent atrial fibrillation.   Assessment/Plan   1. Acute on chronic systolic CHF: In setting of atrial fibrillation with mild RVR and initial uncontrolled HTN.  Echo in 8/23 showed EF 25-30% with normal RV, trivial MR, dilated IVC.  This was significantly lower than in the past (EF 50-55% in 3/23).  LHC in 9/23 showed nonobstructive CAD, unable to explain fall in EF. He was in atrial fibrillation in 8/23, ?tachy-mediated CMP. Diuresed w/ IV Lasix on admit, then developed AKI, likely due from development of hypotension after antihypertensive titration + over-diuresis. Lasix held 10/12. On 10/13 developed flash pulmonary edema. - CVP 10. Hold diuretics. Adjust diuretics after RHC today.  -Continue Coreg 25 mg bid. Heart rate now 50-60s. Had one brief episode down to 30s. Cut back amio to 200 mg twice a day.   - Off entresto w/ AKI and RAS. - Continue Farxiga 10 mg  - Continue po hydralazine 100 TID + Imdur 120 daily.  - Continue  spironolactone 12.5 mg daily today.  - He is agreeable to paramedicine in  the community - Plan for cardiomems today. On eliquis so no need for asa or plavix. Discussed the need for daily readings and the purpose.  2. Atrial fibrillation: He appears to have been in atrial fibrillation (with RVR at times) since 8/23. In SR today. Cut back to amio 200 mg twice a day.  - apixaban 2.5 mg bid held for Cardiomems, restart afterwards.  - Need to maintain NSR, will consider ablation down the road.  3. HTN/renal artery stenosis: Patient was admitted with hypertensive emergency/CHF.  Patient has occluded left renal artery with atrophic left kidney.  By renal artery dopplers, right renal artery with >60% stenosis.  Reviewed this with Dr Gwenlyn Found, however, and he thinks that the degree of stenosis is actually less, more in the 40-50% range.  Will need to follow closely as it is unclear how much renal artery stenosis is  contributing to HTN/flash pulmonary edema episodes.   - Will see Dr. Gwenlyn Found as outpatient, there will be consideration for CO2 angiography.  - Increase Coreg as above and add spironolactone.  4. CAD: H/o PCI x 2 to RCA.  Cath in 8/23 after EF found to be low on echo did not explain his cardiomyopathy.   - Continue statin.  - He is on apixaban so do not think he needs to continue Plavix with no recent PCI.  5. PAD: He has severe right SFA and right EIA stenosis by 12/21 peripheral angiography.  Follows with Dr. Gwenlyn Found. No rest pain or pedal ulcerations.  6. AKI on CKD stage 3: Creatinine up to 2.4 from 1.57 on admit.  Scr 1.92 => 2 today. This may be due to significant renal artery stenosis +/- transient hypotension after aggressively lowering BP.  Plan to for cardiomems today.  Length of Stay: [place], NP  07/02/2022, 8:05 AM   Advanced Heart Failure Team Pager [phone] (M-F; 7a - 5p)  Please contact [facility] for night-coverage after hours (5p -7a ) and weekends on [email or web address]  Patient seen with NP, agree with the above note.   CVP around 8 on my read.   No complaints this morning.    He is in NSR. HR 60s but got down to 30s in sleep.   SBP still in 150s.   General: NAD Neck: JVP 8 cm, no thyromegaly or thyroid nodule.  Lungs: Clear to auscultation bilaterally with normal respiratory effort. CV: Nondisplaced PMI.  Heart regular S1/S2, no S3/S4, no murmur.  No peripheral edema.   Abdomen: Soft, nontender, no hepatosplenomegaly, no distention.  Skin: Intact without lesions or rashes.  Neurologic: Alert and oriented x 3.  Psych: Normal affect. Extremities: No clubbing or cyanosis.  HEENT: Normal.   Volume status looks ok by exam with CVP 8.  Will get formal RHC with Cardiomems placement today.  Will decide on IV Lasix versus torsemide.  Continue Farxiga, can increase spironolactone to 25 mg daily.   He remains in NSR, will decrease amiodarone to 200 mg bid with sinus bradycardia overnight.  Eliquis on hold for Cardiomems, restart after procedure.   Loralie Champagne 07/02/2022 10:36 AM

## 2022-07-02 NOTE — Progress Notes (Signed)
PT cancellation: Attempted to see pt for PT tx but pt noted to be off the floor for cardiac cath. Will f/u as able.  Lavone Nian, PT, DPT 07/02/22, 11:41 AM

## 2022-07-02 NOTE — Assessment & Plan Note (Signed)
Continue levothyroxine 

## 2022-07-02 NOTE — Assessment & Plan Note (Signed)
Continue blood pressure control with isosorbide, hydralazine, carvedilol.  Diuresis with torsemide, spironolactone and SGLT 2 inh.

## 2022-07-02 NOTE — Progress Notes (Signed)
  Progress Note   Patient: Albert Powell IRJ:[id number] DOB: August 06, 1941 DOA: 06/23/2022     9 DOS: the patient was seen and examined on 07/02/2022   Brief hospital course: 81 y/o male w/ h/o chronic diastolic heart failure w/ recent drop in LVEF, 20-25%, cath w/ mild-mod CAD, PAF on Eliquis, HTN, Hypothyroidism, multiple patients with CHF, readmitted w/ a/c CHF in setting of persistent atrial fibrillation.  Improved on diuretics, advanced heart failure team following, recurrent flash pulm edema -complicated by AKI, workup pos for moderate right RAS and atrophic L kidney -Antihypertensives increased, intermittently diuresed  10/18 cardiomems placement.     Assessment and Plan: * Acute on chronic systolic CHF (congestive heart failure) ([name]) Patient with reduced LV systolic function 20 to 30%,  Patient with improvement in volume status  10/18 RA 3 RV 42/9 PA 45/21 mean 32 PCWP 21  PVR 1,7  Cardiac output 6,5 and index 3,0 (Fick) Post capillary pulmonary hypertension   Continue medical therapy with carvedilol, empagliflozin, hydralazine, isosorbide and spironolactone Loop diuretic therapy with torsemide.   Cardiac catheterization today with elevated PCWP.  cardioMEMS has been implanted   Hypertensive crisis Continue blood pressure control with isosorbide, hydralazine, carvedilol.  Diuresis with torsemide, spironolactone and SGLT 2 inh.   Acute kidney injury superimposed on chronic kidney disease ([name]) Hypokalemia.   Renal function with serum cr at 1,99 with K at 3,9 and serum bicarbonate at 25, Continue close follow up on renal function and electrolytes   PAF (paroxysmal atrial fibrillation) ([name]) Continue rate control with amiodarone and carvedilol  Anticoagulation with apixaban   Acquired hypothyroidism Continue levothyroxine   Obesity (BMI 30-39.9)- negative sleep study in the past Calculated BMI is 31,3   Type 2 diabetes mellitus with hyperlipidemia  ([name]) Fasting glucose is 112. Continue sliding scale for glucose cover and monitoring         Subjective: Patient with improvement in dyspnea, no chest pain , no edema   Physical Exam: Vitals:   07/02/22 1325 07/02/22 1340 07/02/22 1357 07/02/22 1641  BP: (!) 169/102 (!) 172/58 (!) 165/68 (!) 181/65  Pulse: 63 (!) 59 (!) 59 60  Resp: 12 19 18    Temp:   97.7 F (36.5 C)   TempSrc:   Oral   SpO2: 100% 98%    Weight:      Height:       Neurology awake and alert ENT with mild pallor Cardiovascular with S1 and 2 present with no gallops Respiratory with no rales  Abdomen not distended Trace non pitting lower extremity edema  Data Reviewed:    Family Communication: no family at the bedside   Disposition: Status is: Inpatient Remains inpatient appropriate because: heart failure   Planned Discharge Destination: Home    Author: Tawni Millers, MD 07/02/2022 6:35 PM  For on call review [email or web address].

## 2022-07-02 NOTE — Assessment & Plan Note (Signed)
Hypokalemia.   Volume status has improved,K was corrected with Kcl At the time of his discharge his renal function has a serum cr of 2,12 with K at 3,4 and serum bicarbonate at 24.  Plan to continue diuresis as outpatient with torsemide, spironolactone and empagliflozin. For now entresto is on hold.  Follow up renal function and electrolytes as outpatient in 7 days.

## 2022-07-02 NOTE — Progress Notes (Signed)
SITE AREA: right groin/femoral  SITE PRIOR TO REMOVAL:  LEVEL 0  PRESSURE APPLIED FOR: approximately 20 minutes, sheath removed and pressure held by Garen Lah, RCIS  MANUAL: yes  PATIENT STATUS DURING PULL: stable  POST PULL SITE:  LEVEL 0  POST PULL INSTRUCTIONS GIVEN: yes  POST PULL PULSES PRESENT: bilateral pedal pulses palpable, right +1, left at +2 (by Marzetta Board, RN)  DRESSING APPLIED: gauze with tegaderm placed  BEDREST BEGINS @ 1300  COMMENTS:

## 2022-07-02 NOTE — Progress Notes (Addendum)
Patient ID: Albert Powell, male   DOB: 08-Oct-1940, 81 y.o.   MRN: [id number]     Advanced Heart Failure Rounding Note  PCP-Cardiologist: Quay Burow, MD   Subjective:   Admit weight 220 lbs -->212 today  10/9 admitted w/ a/c CHF, pulmonary edema and hypertensive urgency, persistent afib. Diuresed, meds adjusted.  10/10 converted to NSR w/ PO amio. Lasix held given AKI   10/13 developed flash pulmonary edema. IV Lasix restarted. Renal US w/ atrophic lt kidney, likely secondary to chronic RAS. Renal artery doppler of the right kidney with mild-moderate stenosis  Yesterday diuresed with IV lasix. Carvedilol increased to 25 mg twice a day and spiro added.   Lab Results  Component Value Date   CREATININE 1.99 (H) 07/02/2022   CREATININE 1.87 (H) 07/01/2022   CREATININE 1.92 (H) 06/30/2022      Denies SOB.     Objective:   Weight Range: 96.3 kg Body mass index is 31.37 kg/m.   Vital Signs:   Temp:  [97.6 F (36.4 C)-97.9 F (36.6 C)] 97.6 F (36.4 C) (10/18 0730) Pulse Rate:  [50-61] 61 (10/18 0730) Resp:  [18-20] 20 (10/18 0730) BP: (114-160)/(53-76) 152/76 (10/18 0730) SpO2:  [97 %-100 %] 97 % (10/18 0730) Weight:  [96.3 kg] 96.3 kg (10/18 0349) Last BM Date : 06/30/22  Weight change: Filed Weights   06/30/22 0324 07/01/22 0353 07/02/22 0349  Weight: 98.1 kg 96.2 kg 96.3 kg    Intake/Output:   Intake/Output Summary (Last 24 hours) at 07/02/2022 0805 Last data filed at 07/02/2022 0622 Gross per 24 hour  Intake 890.01 ml  Output 1600 ml  Net -709.99 ml     CVP 10 personally checked sitting on the side of the bed.  Physical Exam  General: Sitting on the side of the bed.  No resp difficulty HEENT: normal Neck: supple. JVP 9-10. Carotids 2+ bilat; no bruits. No lymphadenopathy or thryomegaly appreciated. Cor: PMI nondisplaced. Regular rate & rhythm. No rubs, gallops or murmurs. Lungs: clear Abdomen: soft, nontender, nondistended. No hepatosplenomegaly.  No bruits or masses. Good bowel sounds. Extremities: no cyanosis, clubbing, rash, edema. RUE PICC  Neuro: alert & orientedx3, cranial nerves grossly intact. moves all 4 extremities w/o difficulty. Affect pleasant  Telemetry  SR-SB 50-60s. One brief episode heart rate down to 30s Labs    CBC No results for input(s): "WBC", "NEUTROABS", "HGB", "HCT", "MCV", "PLT" in the last 72 hours.  Basic Metabolic Panel Recent Labs    07/01/22 0416 07/02/22 0411  NA 139 140  K 3.3* 3.9  CL 102 101  CO2 26 25  GLUCOSE 125* 112*  BUN 19 21  CREATININE 1.87* 1.99*  CALCIUM 8.6* 9.5  MG 1.7  --    Liver Function Tests No results for input(s): "AST", "ALT", "ALKPHOS", "BILITOT", "PROT", "ALBUMIN" in the last 72 hours.  No results for input(s): "LIPASE", "AMYLASE" in the last 72 hours. Cardiac Enzymes No results for input(s): "CKTOTAL", "CKMB", "CKMBINDEX", "TROPONINI" in the last 72 hours.  BNP: BNP (last 3 results) Recent Labs    06/06/22 0206 06/23/22 1752 06/24/22 0358  BNP 196.2* 3,165.5* 3,698.8*    ProBNP (last 3 results) No results for input(s): "PROBNP" in the last 8760 hours.   D-Dimer No results for input(s): "DDIMER" in the last 72 hours.  Hemoglobin A1C No results for input(s): "HGBA1C" in the last 72 hours. Fasting Lipid Panel No results for input(s): "CHOL", "HDL", "LDLCALC", "TRIG", "CHOLHDL", "LDLDIRECT" in the last 72 hours. Thyroid  Function Tests No results for input(s): "TSH", "T4TOTAL", "T3FREE", "THYROIDAB" in the last 72 hours.  Invalid input(s): "FREET3"  Other results:   Imaging    No results found.   Medications:     Scheduled Medications:  amiodarone  400 mg Oral BID   atorvastatin  40 mg Oral Daily   carvedilol  25 mg Oral BID WC   Chlorhexidine Gluconate Cloth  6 each Topical Daily   dapagliflozin propanediol  10 mg Oral Daily   hydrALAZINE  100 mg Oral TID   insulin aspart  0-5 Units Subcutaneous QHS   insulin aspart  0-6 Units  Subcutaneous TID WC   isosorbide mononitrate  120 mg Oral Daily   levothyroxine  25 mcg Oral QAC breakfast   pantoprazole  40 mg Oral QODAY   sodium chloride flush  10-40 mL Intracatheter Q12H   sodium chloride flush  3 mL Intravenous Q12H   sodium chloride flush  3 mL Intravenous Q12H   spironolactone  12.5 mg Oral Daily    Infusions:  sodium chloride     sodium chloride 10 mL/hr at 07/02/22 0622     PRN Medications: sodium chloride, acetaminophen **OR** acetaminophen, cloNIDine, hydrocortisone, ipratropium-albuterol, ondansetron **OR** [DISCONTINUED] ondansetron (ZOFRAN) IV, senna-docusate, sodium chloride flush, sodium chloride flush    Patient Profile   81 y/o male w/ h/o chronic diastolic heart failure w/ recent drop in LVEF, 20-25%, cath w/ mild-mod CAD, PAF on Eliquis, HTN, Hypothyroidism, multiple re-admits for a/c CHF in the last year, readmitted w/ a/c CHF in setting of persistent atrial fibrillation.   Assessment/Plan   1. Acute on chronic systolic CHF: In setting of atrial fibrillation with mild RVR and initial uncontrolled HTN.  Echo in 8/23 showed EF 25-30% with normal RV, trivial MR, dilated IVC.  This was significantly lower than in the past (EF 50-55% in 3/23).  LHC in 9/23 showed nonobstructive CAD, unable to explain fall in EF. He was in atrial fibrillation in 8/23, ?tachy-mediated CMP. Diuresed w/ IV Lasix on admit, then developed AKI, likely due from development of hypotension after antihypertensive titration + over-diuresis. Lasix held 10/12. On 10/13 developed flash pulmonary edema. - CVP 10. Hold diuretics. Adjust diuretics after RHC today.  -Continue Coreg 25 mg bid. Heart rate now 50-60s. Had one brief episode down to 30s. Cut back amio to 200 mg twice a day.   - Off entresto w/ AKI and RAS. - Continue Farxiga 10 mg  - Continue po hydralazine 100 TID + Imdur 120 daily.  - Continue  spironolactone 12.5 mg daily today.  - He is agreeable to paramedicine in  the community - Plan for cardiomems today. On eliquis so no need for asa or plavix. Discussed the need for daily readings and the purpose.  2. Atrial fibrillation: He appears to have been in atrial fibrillation (with RVR at times) since 8/23. In SR today. Cut back to amio 200 mg twice a day.  - apixaban 2.5 mg bid held for Cardiomems, restart afterwards.  - Need to maintain NSR, will consider ablation down the road.  3. HTN/renal artery stenosis: Patient was admitted with hypertensive emergency/CHF.  Patient has occluded left renal artery with atrophic left kidney.  By renal artery dopplers, right renal artery with >60% stenosis.  Reviewed this with Dr Gwenlyn Found, however, and he thinks that the degree of stenosis is actually less, more in the 40-50% range.  Will need to follow closely as it is unclear how much renal artery stenosis is  contributing to HTN/flash pulmonary edema episodes.   - Will see Dr. Gwenlyn Found as outpatient, there will be consideration for CO2 angiography.  - Increase Coreg as above and add spironolactone.  4. CAD: H/o PCI x 2 to RCA.  Cath in 8/23 after EF found to be low on echo did not explain his cardiomyopathy.   - Continue statin.  - He is on apixaban so do not think he needs to continue Plavix with no recent PCI.  5. PAD: He has severe right SFA and right EIA stenosis by 12/21 peripheral angiography.  Follows with Dr. Gwenlyn Found. No rest pain or pedal ulcerations.  6. AKI on CKD stage 3: Creatinine up to 2.4 from 1.57 on admit.  Scr 1.92 => 2 today. This may be due to significant renal artery stenosis +/- transient hypotension after aggressively lowering BP.  Plan to for cardiomems today.  Length of Stay: [place], NP  07/02/2022, 8:05 AM   Advanced Heart Failure Team Pager [phone] (M-F; 7a - 5p)  Please contact [facility] for night-coverage after hours (5p -7a ) and weekends on [email or web address]  Patient seen with NP, agree with the above note.   CVP around 8 on my read.   No complaints this morning.    He is in NSR. HR 60s but got down to 30s in sleep.   SBP still in 150s.   General: NAD Neck: JVP 8 cm, no thyromegaly or thyroid nodule.  Lungs: Clear to auscultation bilaterally with normal respiratory effort. CV: Nondisplaced PMI.  Heart regular S1/S2, no S3/S4, no murmur.  No peripheral edema.   Abdomen: Soft, nontender, no hepatosplenomegaly, no distention.  Skin: Intact without lesions or rashes.  Neurologic: Alert and oriented x 3.  Psych: Normal affect. Extremities: No clubbing or cyanosis.  HEENT: Normal.   Volume status looks ok by exam with CVP 8.  Will get formal RHC with Cardiomems placement today.  Will decide on IV Lasix versus torsemide.  Continue Farxiga, can increase spironolactone to 25 mg daily.   He remains in NSR, will decrease amiodarone to 200 mg bid with sinus bradycardia overnight.  Eliquis on hold for Cardiomems, restart after procedure.   Loralie Champagne 07/02/2022 10:36 AM

## 2022-07-02 NOTE — Assessment & Plan Note (Signed)
Calculated BMI is 31,3 

## 2022-07-02 NOTE — Assessment & Plan Note (Signed)
Fasting glucose is 112. Patient was placed on insulin sliding scale for glucose cover and monitoring.  At home will resume glipizide.

## 2022-07-02 NOTE — Interval H&P Note (Signed)
History and Physical Interval Note:  07/02/2022 11:06 AM  Albert Powell  has presented today for surgery, with the diagnosis of heart failure.  The various methods of treatment have been discussed with the patient and family. After consideration of risks, benefits and other options for treatment, the patient has consented to  Procedure(s): PRESSURE SENSOR/CARDIOMEMS (N/A) RIGHT HEART CATH (N/A) as a surgical intervention.  The patient's history has been reviewed, patient examined, no change in status, stable for surgery.  I have reviewed the patient's chart and labs.  Questions were answered to the patient's satisfaction.     [name]

## 2022-07-02 NOTE — Progress Notes (Signed)
   07/02/22 0900  Mobility  Activity Ambulated with assistance in hallway  Level of Assistance Standby assist, set-up cues, supervision of patient - no hands on  Assistive Device Other (Comment) (Iv pole)  Distance Ambulated (ft) 40 ft  Activity Response Tolerated well  Mobility Referral Yes  $Mobility charge 1 Mobility   Mobility Specialist Progress Note  During Mobility: 63 HR, 96% SpO2  Pt was in bed and agreeable. Mobility cut short d/t SOB. Returned to bed w/ all needs met and call bell in reach  [place] Mobility Specialist

## 2022-07-02 NOTE — Assessment & Plan Note (Addendum)
Echocardiogram with reduced LV systolic function 25 to 21%, RV systolic function preserved. Severe hypokinesis of the left ventricular, basal mid inferoseptal wall, inferior wall and anterolateral wall. Mild hypokinesis of the left ventricular entire antero septal wall, anterior wall and apical segment.    Patient was placed on IV diuretic therapy, negative fluid balance was achieved, - 5,106 ml, with significant improvement of his symptoms.   10/18 cardiac catheterization (right).  RA 3 RV 42/9 PA 45/21 mean 32 PCWP 21  PVR 1,7  Cardiac output 6,5 and index 3,0 (Fick) Post capillary pulmonary hypertension   Continue medical therapy with carvedilol, empagliflozin, hydralazine, isosorbide and spironolactone Loop diuretic therapy with torsemide.  Patient had a cardiomems implanted to prevent re hospitalizations.   Acute hypoxemic respiratory failure due to cardiogenic pulmonary edema. Improved with diuresis,

## 2022-07-03 ENCOUNTER — Other Ambulatory Visit (HOSPITAL_COMMUNITY): Payer: Self-pay

## 2022-07-03 DIAGNOSIS — I169 Hypertensive crisis, unspecified: Secondary | ICD-10-CM | POA: Diagnosis not present

## 2022-07-03 DIAGNOSIS — I5023 Acute on chronic systolic (congestive) heart failure: Secondary | ICD-10-CM | POA: Diagnosis not present

## 2022-07-03 DIAGNOSIS — I480 Paroxysmal atrial fibrillation: Secondary | ICD-10-CM | POA: Diagnosis not present

## 2022-07-03 DIAGNOSIS — N179 Acute kidney failure, unspecified: Secondary | ICD-10-CM | POA: Diagnosis not present

## 2022-07-03 LAB — BASIC METABOLIC PANEL
Anion gap: 13 (ref 5–15)
BUN: 21 mg/dL (ref 8–23)
CO2: 24 mmol/L (ref 22–32)
Calcium: 8.6 mg/dL — ABNORMAL LOW (ref 8.9–10.3)
Chloride: 99 mmol/L (ref 98–111)
Creatinine, Ser: 2.12 mg/dL — ABNORMAL HIGH (ref 0.61–1.24)
GFR, Estimated: 31 mL/min — ABNORMAL LOW (ref >60)
Glucose, Bld: 120 mg/dL — ABNORMAL HIGH (ref 70–99)
Potassium: 3.4 mmol/L — ABNORMAL LOW (ref 3.5–5.1)
Sodium: 136 mmol/L (ref 135–145)

## 2022-07-03 LAB — GLUCOSE, CAPILLARY
Glucose-Capillary: 109 mg/dL — ABNORMAL HIGH (ref 70–99)
Glucose-Capillary: 160 mg/dL — ABNORMAL HIGH (ref 70–99)

## 2022-07-03 LAB — COOXEMETRY PANEL
Carboxyhemoglobin: 2.1 % — ABNORMAL HIGH (ref 0.5–1.5)
Methemoglobin: 1 % (ref 0.0–1.5)
O2 Saturation: 80.2 %
Total hemoglobin: 11.5 g/dL — ABNORMAL LOW (ref 12.0–16.0)

## 2022-07-03 LAB — MAGNESIUM: Magnesium: 2 mg/dL (ref 1.7–2.4)

## 2022-07-03 MED ORDER — APIXABAN 2.5 MG PO TABS
2.5000 mg | ORAL_TABLET | Freq: Two times a day (BID) | ORAL | 0 refills | Status: DC
  Filled 2022-07-03: qty 60, 30d supply, fill #0

## 2022-07-03 MED ORDER — AMIODARONE HCL 200 MG PO TABS
200.0000 mg | ORAL_TABLET | Freq: Two times a day (BID) | ORAL | 0 refills | Status: DC
  Filled 2022-07-03: qty 60, 30d supply, fill #0

## 2022-07-03 MED ORDER — SPIRONOLACTONE 25 MG PO TABS
25.0000 mg | ORAL_TABLET | Freq: Every day | ORAL | 0 refills | Status: DC
  Filled 2022-07-03: qty 30, 30d supply, fill #0

## 2022-07-03 MED ORDER — POTASSIUM CHLORIDE CRYS ER 20 MEQ PO TBCR
40.0000 meq | EXTENDED_RELEASE_TABLET | Freq: Once | ORAL | Status: AC
  Administered 2022-07-03: 40 meq via ORAL
  Filled 2022-07-03: qty 2

## 2022-07-03 MED ORDER — HYDRALAZINE HCL 100 MG PO TABS
100.0000 mg | ORAL_TABLET | Freq: Three times a day (TID) | ORAL | 0 refills | Status: DC
  Filled 2022-07-03: qty 90, 30d supply, fill #0

## 2022-07-03 MED ORDER — ISOSORBIDE MONONITRATE ER 120 MG PO TB24
120.0000 mg | ORAL_TABLET | Freq: Every day | ORAL | 0 refills | Status: DC
  Filled 2022-07-03: qty 30, 30d supply, fill #0

## 2022-07-03 MED ORDER — DAPAGLIFLOZIN PROPANEDIOL 10 MG PO TABS
10.0000 mg | ORAL_TABLET | Freq: Every day | ORAL | 0 refills | Status: AC
  Filled 2022-07-03: qty 30, 30d supply, fill #0

## 2022-07-03 MED ORDER — TORSEMIDE 20 MG PO TABS
40.0000 mg | ORAL_TABLET | Freq: Every day | ORAL | 0 refills | Status: DC
  Filled 2022-07-03: qty 60, 30d supply, fill #0

## 2022-07-03 MED ORDER — CARVEDILOL 25 MG PO TABS
25.0000 mg | ORAL_TABLET | Freq: Two times a day (BID) | ORAL | 0 refills | Status: DC
  Filled 2022-07-03: qty 60, 30d supply, fill #0

## 2022-07-03 NOTE — TOC CM/SW Note (Signed)
HF TOC CM contacted [employer] rep, Ashley with [facility] and scheduled dc home today. [place], Heart Failure TOC CM [phone]

## 2022-07-03 NOTE — Progress Notes (Signed)
Wife arrived onto the unit to transport patient home.

## 2022-07-03 NOTE — Progress Notes (Signed)
RN attempted to call wife twice to locate wife per patient's request. RN unable to reach wife. Will informed the patient.

## 2022-07-03 NOTE — Care Management Important Message (Signed)
Important Message  Patient Details  Name: JOHNN KRASOWSKI MRN: [id number] Date of Birth: [birth year]   Medicare Important Message Given:  Yes     Shelda Altes 07/03/2022, 11:28 AM

## 2022-07-03 NOTE — Progress Notes (Addendum)
Patient ID: Albert Powell, male   DOB: Jan 22, 1941, 81 y.o.   MRN: [id number]     Advanced Heart Failure Rounding Note  PCP-Cardiologist: Quay Burow, MD   Subjective:   Admit weight 220 lbs -->211 today  10/9 admitted w/ a/c CHF, pulmonary edema and hypertensive urgency, persistent afib. Diuresed, meds adjusted.  10/10 converted to NSR w/ PO amio. Lasix held given AKI   10/13 developed flash pulmonary edema. IV Lasix restarted. Renal US w/ atrophic lt kidney, likely secondary to chronic RAS. Renal artery doppler of the right kidney with mild-moderate stenosis 10/18 S/P Cardiomems . RA 3 PWP 32 CO 6.5 CI 3.1  Lab Results  Component Value Date   CREATININE 2.12 (H) 07/03/2022   CREATININE 1.99 (H) 07/02/2022   CREATININE 1.87 (H) 07/01/2022       Wants to go home.     Objective:   Weight Range: 96 kg Body mass index is 31.25 kg/m.   Vital Signs:   Temp:  [97.2 F (36.2 C)-98.3 F (36.8 C)] 97.2 F (36.2 C) (10/19 0708) Pulse Rate:  [51-145] 145 (10/19 0708) Resp:  [12-21] 19 (10/19 0708) BP: (140-185)/(58-122) 169/66 (10/19 0708) SpO2:  [93 %-100 %] 98 % (10/19 0708) Weight:  [96 kg] 96 kg (10/19 0411) Last BM Date : 06/30/22  Weight change: Filed Weights   07/01/22 0353 07/02/22 0349 07/03/22 0411  Weight: 96.2 kg 96.3 kg 96 kg    Intake/Output:   Intake/Output Summary (Last 24 hours) at 07/03/2022 0736 Last data filed at 07/03/2022 0600 Gross per 24 hour  Intake 1661.5 ml  Output 2375 ml  Net -713.5 ml     CVP sitting on the side of the bed. 9-10  Physical Exam  General:  Sitting on the side of the bed. Well appearing. No resp difficulty HEENT: normal Neck: supple. JVP 8-9 . Carotids 2+ bilat; no bruits. No lymphadenopathy or thryomegaly appreciated. Cor: PMI nondisplaced. Regular rate & rhythm. No rubs, gallops or murmurs. Lungs: clear Abdomen: soft, nontender, nondistended. No hepatosplenomegaly. No bruits or masses. Good bowel  sounds. Extremities: no cyanosis, clubbing, rash, edema. RUE PICC  Neuro: alert & orientedx3, cranial nerves grossly intact. moves all 4 extremities w/o difficulty. Affect pleasant  Telemetry  SR-SB 50-60s. One brief episode heart rate down to 30s Labs    CBC Recent Labs    07/02/22 1145 07/02/22 1146  HGB 10.9* 11.2*  HCT 32.0* 33.0*    Basic Metabolic Panel Recent Labs    07/01/22 0416 07/02/22 0411 07/02/22 1145 07/02/22 1146 07/03/22 0429  NA 139 140   < > 139 136  K 3.3* 3.9   < > 3.9 3.4*  CL 102 101  --   --  99  CO2 26 25  --   --  24  GLUCOSE 125* 112*  --   --  120*  BUN 19 21  --   --  21  CREATININE 1.87* 1.99*  --   --  2.12*  CALCIUM 8.6* 9.5  --   --  8.6*  MG 1.7  --   --   --  2.0   < > = values in this interval not displayed.   Liver Function Tests No results for input(s): "AST", "ALT", "ALKPHOS", "BILITOT", "PROT", "ALBUMIN" in the last 72 hours.  No results for input(s): "LIPASE", "AMYLASE" in the last 72 hours. Cardiac Enzymes No results for input(s): "CKTOTAL", "CKMB", "CKMBINDEX", "TROPONINI" in the last 72 hours.  BNP: BNP (last  3 results) Recent Labs    06/06/22 0206 06/23/22 1752 06/24/22 0358  BNP 196.2* 3,165.5* 3,698.8*    ProBNP (last 3 results) No results for input(s): "PROBNP" in the last 8760 hours.   D-Dimer No results for input(s): "DDIMER" in the last 72 hours.  Hemoglobin A1C No results for input(s): "HGBA1C" in the last 72 hours. Fasting Lipid Panel No results for input(s): "CHOL", "HDL", "LDLCALC", "TRIG", "CHOLHDL", "LDLDIRECT" in the last 72 hours. Thyroid Function Tests No results for input(s): "TSH", "T4TOTAL", "T3FREE", "THYROIDAB" in the last 72 hours.  Invalid input(s): "FREET3"  Other results:   Imaging    CARDIAC CATHETERIZATION  Result Date: 07/02/2022 1. Successful Cardiomems pressure sensor placement. 2. Mild pulmonary venous hypertension. 3. Mildly elevated PCWP.     Medications:      Scheduled Medications:  amiodarone  200 mg Oral BID   apixaban  2.5 mg Oral BID   atorvastatin  40 mg Oral Daily   carvedilol  25 mg Oral BID WC   Chlorhexidine Gluconate Cloth  6 each Topical Daily   dapagliflozin propanediol  10 mg Oral Daily   hydrALAZINE  100 mg Oral TID   insulin aspart  0-5 Units Subcutaneous QHS   insulin aspart  0-6 Units Subcutaneous TID WC   isosorbide mononitrate  120 mg Oral Daily   levothyroxine  25 mcg Oral QAC breakfast   pantoprazole  40 mg Oral QODAY   sodium chloride flush  10-40 mL Intracatheter Q12H   spironolactone  25 mg Oral Daily   torsemide  40 mg Oral Daily    Infusions:     PRN Medications: acetaminophen **OR** acetaminophen, acetaminophen, cloNIDine, hydrocortisone, ipratropium-albuterol, ondansetron (ZOFRAN) IV, ondansetron **OR** [DISCONTINUED] ondansetron (ZOFRAN) IV, senna-docusate, sodium chloride flush    Patient Profile   81 y/o male w/ h/o chronic diastolic heart failure w/ recent drop in LVEF, 20-25%, cath w/ mild-mod CAD, PAF on Eliquis, HTN, Hypothyroidism, multiple re-admits for a/c CHF in the last year, readmitted w/ a/c CHF in setting of persistent atrial fibrillation.   Assessment/Plan   1. Acute on chronic systolic CHF: In setting of atrial fibrillation with mild RVR and initial uncontrolled HTN.  Echo in 8/23 showed EF 25-30% with normal RV, trivial MR, dilated IVC.  This was significantly lower than in the past (EF 50-55% in 3/23).  LHC in 9/23 showed nonobstructive CAD, unable to explain fall in EF. He was in atrial fibrillation in 8/23, ?tachy-mediated CMP. Diuresed w/ IV Lasix on admit, then developed AKI, likely due from development of hypotension after antihypertensive titration + over-diuresis. Lasix held 10/12. On 10/13 developed flash pulmonary edema. S/P Cardiomems  . CVP 9-10. Overall diuresed 9 pounds. Continue torsemide 40 mg daily.  -Continue Coreg 25 mg bid.  - Continue amio to 200 mg twice a day.    - Off entresto w/ AKI and RAS. - Continue Farxiga 10 mg  - Continue po hydralazine 100 TID + Imdur 120 daily.  - Continue  spironolactone 25 mg daily  - He is agreeable to paramedicine in the community - Reinforced daily Cardiomems readings.  2. Atrial fibrillation: He appears to have been in atrial fibrillation (with RVR at times) since 8/23. In SR today.  - Continue amio 200 mg twice a day.  - apixaban 2.5 mg bid held for Cardiomems - Need to maintain NSR, will consider ablation down the road.  3. HTN/renal artery stenosis: Patient was admitted with hypertensive emergency/CHF.  Patient has occluded left renal artery  with atrophic left kidney.  By renal artery dopplers, right renal artery with >60% stenosis.  Reviewed this with Dr Gwenlyn Found, however, and he thinks that the degree of stenosis is actually less, more in the 40-50% range.  Will need to follow closely as it is unclear how much renal artery stenosis is contributing to HTN/flash pulmonary edema episodes.   - Will see Dr. Gwenlyn Found as outpatient, there will be consideration for CO2 angiography.  - Continue current regimen.   4. CAD: H/o PCI x 2 to RCA.  Cath in 8/23 after EF found to be low on echo did not explain his cardiomyopathy.   - Continue statin.  - He is on apixaban so do not think he needs to continue Plavix with no recent PCI.  5. PAD: He has severe right SFA and right EIA stenosis by 12/21 peripheral angiography.  Follows with Dr. Gwenlyn Found. No rest pain or pedal ulcerations.  6. AKI on CKD stage 3: Creatinine up to 2.4 from 1.57 on admit.  Scr 1.92 => 2 => 2.12 today. This may be due to significant renal artery stenosis +/- transient hypotension after aggressively lowering BP  Ok for d/c  HF meds Torsemide 40 mg daily  Spironolactone 25 mg daily  Imdur 120 mg daily Hydralazine 100 mg tid Farxiga 10 mg daily Carvedilol 25 mg twice a day Amiodarone 200 mg twice a day x 1 week then once daily after that.  Eliquis 2.5 mg twice a  day  Atorvastatin 40 mg daily   HF f/u has been set up.   Length of Stay: [place], NP  07/03/2022, 7:36 AM   Advanced Heart Failure Team Pager [phone] (M-F; 7a - 5p)  Please contact [facility] for night-coverage after hours (5p -7a ) and weekends on [email or web address]  Patient seen with NP, agree with the above note.   CVP remains 9-10 but weight trending down.  He denies dyspnea.  Creatinine mildly higher at 2.12 with stable BUN.   General: NAD Neck: JVP 8 cm, no thyromegaly or thyroid nodule.  Lungs: Clear to auscultation bilaterally with normal respiratory effort. CV: Nondisplaced PMI.  Heart regular S1/S2, no S3/S4, no murmur.  No peripheral edema.   Abdomen: Soft, nontender, no hepatosplenomegaly, no distention.  Skin: Intact without lesions or rashes.  Neurologic: Alert and oriented x 3.  Psych: Normal affect. Extremities: No clubbing or cyanosis.  HEENT: Normal.   Patient remains in atrial fibrillation.  Continue amiodarone 200 mg bid x 1 more week then 200 daily.  Continue apixaban.   Volume status gradually improving on current torsemide, continue.  Has Cardiomems, will follow as outpatient.   BP still mildly elevated but will continue current regimen for now.  Needs followup with Dr. Gwenlyn Found for assessment of renal artery stenosis.    He will have HF clinic followup.    Loralie Champagne 07/03/2022 1:05 PM

## 2022-07-03 NOTE — Progress Notes (Signed)
RN went over discharge instructions with the patient at the bedside. PICC site is clean dry and intact with gauze covered applied by IV team RN. RN educated patient with PICC site care. Patient verbalize understanding. RN went over with medication changes and patient verbalize understanding. RN made patient aware of follow up appointments. TOC meds at bedside. RN spoke to daughter [name] about patient's discharge. Patient relayed to RN that wife will be transporting the patient home. Tele monitor removed and CCMD notified of d/c.

## 2022-07-03 NOTE — Plan of Care (Signed)
  Problem: Education: Goal: Ability to demonstrate management of disease process will improve Outcome: Progressing Goal: Ability to verbalize understanding of medication therapies will improve Outcome: Progressing Goal: Individualized Educational Video(s) Outcome: Progressing   Problem: Cardiac: Goal: Ability to achieve and maintain adequate cardiopulmonary perfusion will improve Outcome: Progressing   Problem: Education: Goal: Ability to describe self-care measures that may prevent or decrease complications (Diabetes Survival Skills Education) will improve Outcome: Progressing Goal: Individualized Educational Video(s) Outcome: Progressing   Problem: Coping: Goal: Ability to adjust to condition or change in health will improve Outcome: Progressing   Problem: Fluid Volume: Goal: Ability to maintain a balanced intake and output will improve Outcome: Progressing   Problem: Health Behavior/Discharge Planning: Goal: Ability to identify and utilize available resources and services will improve Outcome: Progressing Goal: Ability to manage health-related needs will improve Outcome: Progressing   Problem: Metabolic: Goal: Ability to maintain appropriate glucose levels will improve Outcome: Progressing   Problem: Nutritional: Goal: Maintenance of adequate nutrition will improve Outcome: Progressing Goal: Progress toward achieving an optimal weight will improve Outcome: Progressing   Problem: Skin Integrity: Goal: Risk for impaired skin integrity will decrease Outcome: Progressing   Problem: Tissue Perfusion: Goal: Adequacy of tissue perfusion will improve Outcome: Progressing   Problem: Education: Goal: Knowledge of General Education information will improve Description: Including pain rating scale, medication(s)/side effects and non-pharmacologic comfort measures Outcome: Progressing   Problem: Health Behavior/Discharge Planning: Goal: Ability to manage health-related  needs will improve Outcome: Progressing   Problem: Clinical Measurements: Goal: Ability to maintain clinical measurements within normal limits will improve Outcome: Progressing Goal: Will remain free from infection Outcome: Progressing Goal: Diagnostic test results will improve Outcome: Progressing Goal: Respiratory complications will improve Outcome: Progressing Goal: Cardiovascular complication will be avoided Outcome: Progressing   Problem: Activity: Goal: Risk for activity intolerance will decrease Outcome: Progressing   Problem: Nutrition: Goal: Adequate nutrition will be maintained Outcome: Progressing   Problem: Coping: Goal: Level of anxiety will decrease Outcome: Progressing   Problem: Elimination: Goal: Will not experience complications related to bowel motility Outcome: Progressing Goal: Will not experience complications related to urinary retention Outcome: Progressing   Problem: Pain Managment: Goal: General experience of comfort will improve Outcome: Progressing   Problem: Safety: Goal: Ability to remain free from injury will improve Outcome: Progressing   Problem: Skin Integrity: Goal: Risk for impaired skin integrity will decrease Outcome: Progressing   Problem: Education: Goal: Understanding of CV disease, CV risk reduction, and recovery process will improve Outcome: Progressing Goal: Individualized Educational Video(s) Outcome: Progressing   Problem: Activity: Goal: Ability to return to baseline activity level will improve Outcome: Progressing   Problem: Cardiovascular: Goal: Ability to achieve and maintain adequate cardiovascular perfusion will improve Outcome: Progressing Goal: Vascular access site(s) Level 0-1 will be maintained Outcome: Progressing   Problem: Health Behavior/Discharge Planning: Goal: Ability to safely manage health-related needs after discharge will improve Outcome: Progressing   Problem: Education: Goal:  Knowledge of cardiac device and self-care will improve Outcome: Progressing Goal: Ability to safely manage health related needs after discharge will improve Outcome: Progressing Goal: Individualized Educational Video(s) Outcome: Progressing   Problem: Cardiac: Goal: Ability to achieve and maintain adequate cardiopulmonary perfusion will improve Outcome: Progressing

## 2022-07-03 NOTE — Progress Notes (Signed)
Physical Therapy Treatment Patient Details Name: Albert Powell MRN: [id number] DOB: 1940/11/12 Today's Date: 07/03/2022   History of Present Illness Pt is an 81 y/o M admitted on 06/23/22 for treatment of acute on chronic systolic CHF. PMH: chronic diastolic heart failure with recent drop in LVEF 20-25%, cath with mild-mod CAD, PAF on eliquis, HTN, hypothyroidism    PT Comments    Pt tolerates treatment well, ambulating for increased distances and with good stability. Pt continues to demonstrate R knee weakness, with mild instability with fatigue when mobilizing. Pt will benefit from continued aggressive mobilization in an effort to improve endurance and BLE strength. PT recommends discharge home with HHPT when medically ready.   Recommendations for follow up therapy are one component of a multi-disciplinary discharge planning process, led by the attending physician.  Recommendations may be updated based on patient status, additional functional criteria and insurance authorization.  Follow Up Recommendations  Home health PT     Assistance Recommended at Discharge Intermittent Supervision/Assistance  Patient can return home with the following A little help with walking and/or transfers;A little help with bathing/dressing/bathroom;Help with stairs or ramp for entrance   Equipment Recommendations  None recommended by PT    Recommendations for Other Services       Precautions / Restrictions Precautions Precautions: Fall Precaution Comments: monitor vitals Restrictions Weight Bearing Restrictions: No     Mobility  Bed Mobility                    Transfers Overall transfer level: Modified independent Equipment used: Rollator (4 wheels) Transfers: Sit to/from Stand Sit to Stand: Modified independent (Device/Increase time)                Ambulation/Gait Ambulation/Gait assistance: Supervision Gait Distance (Feet): 200 Feet Assistive device: Rollator (4  wheels) Gait Pattern/deviations: Step-through pattern, Decreased stance time - right Gait velocity: reduced Gait velocity interpretation: 1.31 - 2.62 ft/sec, indicative of limited community ambulator   General Gait Details: pt with reduced stance time on RLE with increased ambulation distances, mild R knee instability observed although no overt buckling or losses of balance   Stairs             Wheelchair Mobility    Modified Rankin (Stroke Patients Only)       Balance Overall balance assessment: Needs assistance Sitting-balance support: Feet supported, No upper extremity supported Sitting balance-Leahy Scale: Good     Standing balance support: Single extremity supported, Reliant on assistive device for balance Standing balance-Leahy Scale: Poor                              Cognition Arousal/Alertness: Awake/alert Behavior During Therapy: WFL for tasks assessed/performed Overall Cognitive Status: Within Functional Limits for tasks assessed                                          Exercises      General Comments General comments (skin integrity, edema, etc.): VSS on RA, pt denies symptoms      Pertinent Vitals/Pain Pain Assessment Pain Assessment: No/denies pain    Home Living                          Prior Function  PT Goals (current goals can now be found in the care plan section) Acute Rehab PT Goals Patient Stated Goal: go home Progress towards PT goals: Progressing toward goals    Frequency    Min 3X/week      PT Plan Current plan remains appropriate    Co-evaluation              AM-PAC PT "6 Clicks" Mobility   Outcome Measure  Help needed turning from your back to your side while in a flat bed without using bedrails?: None Help needed moving from lying on your back to sitting on the side of a flat bed without using bedrails?: None Help needed moving to and from a bed to a chair  (including a wheelchair)?: None Help needed standing up from a chair using your arms (e.g., wheelchair or bedside chair)?: None Help needed to walk in hospital room?: A Little Help needed climbing 3-5 steps with a railing? : A Little 6 Click Score: 22    End of Session   Activity Tolerance: Patient tolerated treatment well Patient left: in bed;with call bell/phone within reach Nurse Communication: Mobility status PT Visit Diagnosis: Unsteadiness on feet (R26.81);Muscle weakness (generalized) (M62.81)     Time: 2637-8588 PT Time Calculation (min) (ACUTE ONLY): 9 min  Charges:  $Gait Training: 8-22 mins                     [name], PT, DPT Acute Rehabilitation Office [place] 07/03/2022, 10:32 AM

## 2022-07-03 NOTE — Discharge Summary (Addendum)
Physician Discharge Summary   Patient: Albert Powell MRN: [id number] DOB: 1941-04-01  Admit date:     06/23/2022  Discharge date: 07/03/22  Discharge Physician: Albert Powell Albert Powell   PCP: [name], MD   Recommendations at discharge:    Loop diuretic therapy has been changed to torsemide Increased dose of isosorbide, spironolactone and carvedilol Holding on Entresto for now for recovering renal failure.  He had a Cardiomems implanted Follow up with cardiology as scheduled Follow up with Dr Chales Salmon in 7 to 10 days Follow up renal function and electrolytes in 7 days as outpatient.   Discharge Diagnoses: Principal Problem:   Acute on chronic systolic CHF (congestive heart failure) ([name]) Active Problems:   Hypertensive crisis   Acute kidney injury superimposed on chronic kidney disease ([name])   PAF (paroxysmal atrial fibrillation) ([name])   Acquired hypothyroidism   Obesity (BMI 30-39.9)- negative sleep study in the past   Type 2 diabetes mellitus with hyperlipidemia ([name])  Resolved Problems:   * No resolved hospital problems. [place] [facility] Course: [name] was admitted to the hospital with the working diagnosis of decompensated heart failure.   81 y/o male w/ h/o chronic diastolic heart failure w/ recent drop in LVEF, 20-25%, cath w/ mild-mod CAD, PAF on Eliquis, HTN, Hypothyroidism, and persistent atrial fibrillation. Patient had frequent hospitalizations for heart failure. He reported 2 to 3 days of worsening dyspnea that prompted him to come back to the hospital. On his initial physical examination his blood pressure was 190/144, HR 107, RR 23 and 02 saturation 94%, lungs with no wheezing, positive increased work of breathing, heart with S1 and S2 present, irregularly irregular with no gallops, abdomen not distended and no lower extremity edema.   Na 137, K 3,0 CL 98, bicarbonate 26, glucose 182, bun 15 cr 1,57  BNP 3,165 High sensitive troponin 64 and 66   Wbc 9,5 hgb 14.0 plt 307   Chest radiograph with mild cardiomegaly, bilateral hilar vascular congestion with bilateral interstitial infiltrates at the lower lobes, more on the right.   EKG 98 bpm, left axis deviation, not prolonged qtc, atrial fibrillation rhythm with no significant ST segment or T wave changes.   Improved on diuretics, advanced heart failure team following, recurrent flash pulm edema -complicated by AKI, workup pos for moderate right RAS and atrophic L kidney -Antihypertensives increased, intermittently diuresed  10/18 cardiomems placement.  10/19 patient will be discharge home with close follow up as outpatient.     Assessment and Plan: * Acute on chronic systolic CHF (congestive heart failure) ([name]) Echocardiogram with reduced LV systolic function 25 to 30%, RV systolic function preserved. Severe hypokinesis of the left ventricular, basal mid inferoseptal wall, inferior wall and anterolateral wall. Mild hypokinesis of the left ventricular entire antero septal wall, anterior wall and apical segment.    Patient was placed on IV diuretic therapy, negative fluid balance was achieved, - 5,106 ml, with significant improvement of his symptoms.   10/18 cardiac catheterization (right).  RA 3 RV 42/9 PA 45/21 mean 32 PCWP 21  PVR 1,7  Cardiac output 6,5 and index 3,0 (Fick) Post capillary pulmonary hypertension   Continue medical therapy with carvedilol, empagliflozin, hydralazine, isosorbide and spironolactone Loop diuretic therapy with torsemide.  Patient had a cardiomems implanted to prevent re hospitalizations.   Acute hypoxemic respiratory failure due to cardiogenic pulmonary edema. Improved with diuresis,   Hypertensive crisis Continue blood pressure control with isosorbide, hydralazine, carvedilol.  Diuresis with torsemide, spironolactone and SGLT  2 inh.   Acute kidney injury superimposed on chronic kidney disease ([name]) Hypokalemia.   Volume status has  improved,K was corrected with Kcl At the time of his discharge his renal function has a serum cr of 2,12 with K at 3,4 and serum bicarbonate at 24.  Plan to continue diuresis as outpatient with torsemide, spironolactone and empagliflozin. For now entresto is on hold.  Follow up renal function and electrolytes as outpatient in 7 days.   PAF (paroxysmal atrial fibrillation) ([name]) Continue rate control with amiodarone (current dose is 200 mg bid, follow as outpatient for possible further decrease in dosage).  Carvedilol dose has been increased.  Anticoagulation with apixaban   Acquired hypothyroidism Continue levothyroxine   Obesity (BMI 30-39.9)- negative sleep study in the past Calculated BMI is 31,3   Type 2 diabetes mellitus with hyperlipidemia ([name]) Fasting glucose is 112. Patient was placed on insulin sliding scale for glucose cover and monitoring.  At home will resume glipizide.          Consultants: cardiology  Procedures performed: cardiac catheterization and cardiomems implantation   Disposition: Home Diet recommendation:  Cardiac and Carb modified diet DISCHARGE MEDICATION: Allergies as of 07/03/2022       Reactions   Fentanyl Other (See Comments)   Behavioral changes   Gabapentin Swelling   Lisinopril Other (See Comments)   unknown   Lyrica [pregabalin] Other (See Comments)   unknown   Metformin Diarrhea   Propofol Other (See Comments)   Heart rate dropped        Medication List     STOP taking these medications    amLODipine 5 MG tablet Commonly known as: NORVASC   Entresto 97-103 MG Generic drug: sacubitril-valsartan   furosemide 80 MG tablet Commonly known as: LASIX       TAKE these medications    amiodarone 200 MG tablet Commonly known as: PACERONE Take 1 tablet (200 mg total) by mouth 2 (two) times daily.   apixaban 2.5 MG Tabs tablet Commonly known as: ELIQUIS Take 1 tablet (2.5 mg total) by mouth 2 (two) times daily. What  changed:  medication strength how much to take   atorvastatin 40 MG tablet Commonly known as: LIPITOR Take 1 tablet (40 mg total) by mouth daily.   carvedilol 25 MG tablet Commonly known as: COREG Take 1 tablet (25 mg total) by mouth 2 (two) times daily with a meal. What changed:  medication strength how much to take   clopidogrel 75 MG tablet Commonly known as: PLAVIX Take 1 tablet (75 mg total) by mouth daily.   dapagliflozin propanediol 10 MG Tabs tablet Commonly known as: Farxiga Take 1 tablet (10 mg total) by mouth daily before breakfast.   glipiZIDE 5 MG tablet Commonly known as: GLUCOTROL Take 5 mg by mouth daily before breakfast.   hydrALAZINE 100 MG tablet Commonly known as: APRESOLINE Take 1 tablet (100 mg total) by mouth 3 (three) times daily.   isosorbide mononitrate 120 MG 24 hr tablet Commonly known as: IMDUR Take 1 tablet (120 mg total) by mouth daily. Start taking on: July 04, 2022 What changed: medication strength   levothyroxine 25 MCG tablet Commonly known as: SYNTHROID TAKE ONE TABLET BY MOUTH DAILY BEFORE BREAKFAST What changed: See the new instructions.   pantoprazole 40 MG tablet Commonly known as: PROTONIX Take 40 mg by mouth every other day.   spironolactone 25 MG tablet Commonly known as: ALDACTONE Take 1 tablet (25 mg total) by mouth daily.  Torsemide 40 MG Tabs Take 40 mg by mouth daily. Start taking on: July 04, 2022        Follow-up Information     [facility] Follow up on 07/09/2022.   Specialty: Cardiology Why: [facility] 8:30 am Entrance C, Free Valet Parking Please bring all meds to appointment. Contact information: [address] [id number] mc [place] [address] [phone]        [facility] Follow up.   Why: Home Health RN -agency will call to arrange follow up appt Contact information: [phone]         [name], MD Follow up on 07/15/2022.   Specialty: Internal Medicine Why: @9 :30am Contact information: [address] [place] [phone]                Discharge Exam: [id number] Weights   07/01/22 0353 07/02/22 0349 07/03/22 0411  Weight: 96.2 kg 96.3 kg 96 kg   BP (!) 153/64 (BP Location: Left Arm)   Pulse 62   Temp 97.8 F (36.6 C) (Oral)   Resp 16   Ht 5\' 9"  (1.753 m)   Wt 96 kg   SpO2 97%   BMI 31.25 kg/m   Patient is feeling better, no dyspnea or chest pain, no edema  Neurology awake and alert ENT with mild pallor Cardiovascular with S1 and S2 present irregularly irregular with no gallops or murmurs Respiratory with no rales or wheezing Abdomen with no distention  No lower extremity edema   Condition at discharge: stable  The results of significant diagnostics from this hospitalization (including imaging, microbiology, ancillary and laboratory) are listed below for reference.   Imaging Studies: CARDIAC CATHETERIZATION  Result Date: 07/02/2022 1. Successful Cardiomems pressure sensor placement. 2. Mild pulmonary venous hypertension. 3. Mildly elevated PCWP.   VAS RENAL ARTERY DUPLEX  Result Date: 06/27/2022 ABDOMINAL VISCERAL Patient Name:  URIE LOUGHNER  Date of Exam:   06/27/2022 Medical Rec #: [name]         Accession #:    06/29/2022 Date of Birth: 10-26-1940         Patient Gender: M Patient Age:   72 years Exam Location:  [facility] Procedure:      VAS 96 RENAL ARTERY DUPLEX Referring Phys: [id number] LINDSAY NICOLE [facility] -------------------------------------------------------------------------------- Indications: Hypertension, history of left renal artery stenosis 99% High Risk Factors: Diabetes, coronary artery disease. Other Factors: Acute on chronic CHF. Limitations: Obesity and Respiratory interference- patient on O2 support, unable to withhold respirations. Comparison Study: 06-26-2022 Prior  renal duplex showed atrophic left kidney with                   multiple cysts, largest >3cm                    99% Left renal artery stenosis on angiogram 2021. Performing Technologist: 08-26-2022 RDMS, RVT  Examination Guidelines: A complete evaluation includes B-mode imaging, spectral Doppler, color Doppler, and power Doppler as needed of all accessible portions of each vessel. Bilateral testing is considered an integral part of a complete examination. Limited examinations for reoccurring indications may be performed as noted.  Duplex Findings: +------------------+--------+--------+-------+ Right Renal ArteryPSV cm/sEDV cm/sComment +------------------+--------+--------+-------+ Origin              249      21           +------------------+--------+--------+-------+  Proximal            223      33           +------------------+--------+--------+-------+ Mid                 191      35           +------------------+--------+--------+-------+ Distal              104      21           +------------------+--------+--------+-------+ +-----------------+--------+--------+---------------------------+ Left Renal ArteryPSV cm/sEDV cm/s          Comment           +-----------------+--------+--------+---------------------------+ Proximal                         Unable to obtain LRA signal +-----------------+--------+--------+---------------------------+ +------------+--------+--------+----+-----------+--------+--------+------------+ Right KidneyPSV cm/sEDV cm/sRI  Left KidneyPSV cm/sEDV cm/sRI           +------------+--------+--------+----+-----------+--------+--------+------------+ Upper Pole  16      5       0.68Upper Pole                              +------------+--------+--------+----+-----------+--------+--------+------------+ Mid         56      9       0.                        No evidence                                                              of flow                                                                 within left                                                             kidney by                                                               color                                                                   doppler      +------------+--------+--------+----+-----------+--------+--------+------------+ Lower  Pole  30      8       0.73Lower Pole                              +------------+--------+--------+----+-----------+--------+--------+------------+ Hilar       76      14      0.82Hilar                                   +------------+--------+--------+----+-----------+--------+--------+------------+ +------------------+-----+------------------+----------------------------------+ Right Kidney           Left Kidney                                          +------------------+-----+------------------+----------------------------------+ RAR                    RAR                                                  +------------------+-----+------------------+----------------------------------+ RAR (manual)      3.23 RAR (manual)      Unable to obtain LRA signal,                                                history of 99% stenosis in 2021    +------------------+-----+------------------+----------------------------------+ Cortex                 Cortex                                               +------------------+-----+------------------+----------------------------------+ Cortex thickness       Corex thickness                                      +------------------+-----+------------------+----------------------------------+ Kidney length (cm)10.96Kidney length (cm)7.50                               +------------------+-----+------------------+----------------------------------+  Summary: Renal:  Right: Normal size right kidney. Elevated  velocities suggest >60%        stenosis at the renal artery origin; however, in the setting        of left renal atrophy with no observable flow, velocities may        overestimate degree of stenosis due to compensation. Left:  Abnormal size for the left kidney. Cyst(s) noted. Unable to        obtain left renal artery signal. No evidence of flow within        the left kidney by color doppler.  *See table(s) above for measurements and observations.  Diagnosing physician: Sherald Hess MD  Electronically signed by Sherald Hess MD on 06/27/2022 at 1:38:29 PM.    Final    US RENAL  Result Date: 06/26/2022 CLINICAL  DATA:  Acute kidney injury.  Inpatient. EXAM: RENAL / URINARY TRACT ULTRASOUND COMPLETE COMPARISON:  05/23/2011 CT abdomen/pelvis. FINDINGS: Right Kidney: Renal measurements: 11.6 x 6.0 x 7.0 cm = volume: 252 mL. No hydronephrosis. No renal masses. Mildly echogenic renal parenchyma, normal thickness. Left Kidney: Renal measurements: 8.7 x 3.9 x 4.1 cm = volume: 73 mL. No hydronephrosis. Echogenic thin renal parenchyma. Simple left renal cysts, largest 3.0 x 3.1 x 3.0 cm in the lower left kidney. Bladder: Appears normal for degree of bladder distention. Other: None. IMPRESSION: 1. No hydronephrosis. 2. Asymmetric left renal atrophy. 3. Echogenic kidneys, compatible with nonspecific acute on chronic renal parenchymal disease. 4. Simple left renal cysts, for which no follow-up imaging is recommended. 5. Normal bladder. Electronically Signed   By: Ilona Sorrel M.D.   On: 06/26/2022 12:15   [place] EKG SITE RITE  Result Date: 06/24/2022 If Site Rite image not attached, placement could not be confirmed due to current cardiac rhythm.  DG Chest Port 1 View  Result Date: 06/23/2022 CLINICAL DATA:  dyspnea EXAM: PORTABLE CHEST 1 VIEW COMPARISON:  Chest x-ray 06/03/2022, CT angiography chest 06/02/2022, chest x-ray 05/14/2022 FINDINGS: The heart and mediastinal contours are unchanged. Aortic  calcification Persistent retrocardiac airspace opacity. No pulmonary edema. Persistent bilateral trace to small volume pleural effusions. No pneumothorax. No acute osseous abnormality. Neural simulator leads overlying the thoracic spine. IMPRESSION: 1. Persistent retrocardiac airspace opacity. Persistent bilateral trace to small volume pleural effusions. 2.  Aortic Atherosclerosis (ICD10-I70.0). Electronically Signed   By: Iven Finn M.D.   On: 06/23/2022 18:47    Microbiology: Results for orders placed or performed during the hospital encounter of 06/02/22  Resp Panel by RT-PCR (Flu A&B, Covid) Anterior Nasal Swab     Status: None   Collection Time: 06/02/22  2:00 PM   Specimen: Anterior Nasal Swab  Result Value Ref Range Status   SARS Coronavirus 2 by RT PCR NEGATIVE NEGATIVE Final    Comment: (NOTE) SARS-CoV-2 target nucleic acids are NOT DETECTED.  The SARS-CoV-2 RNA is generally detectable in upper respiratory specimens during the acute phase of infection. The lowest concentration of SARS-CoV-2 viral copies this assay can detect is 138 copies/mL. A negative result does not preclude SARS-Cov-2 infection and should not be used as the sole basis for treatment or other patient management decisions. A negative result may occur with  improper specimen collection/handling, submission of specimen other than nasopharyngeal swab, presence of viral mutation(s) within the areas targeted by this assay, and inadequate number of viral copies(<138 copies/mL). A negative result must be combined with clinical observations, patient history, and epidemiological information. The expected result is Negative.  Fact Sheet for Patients:  EntrepreneurPulse.com.au  Fact Sheet for Healthcare Providers:  IncredibleEmployment.be  This test is no t yet approved or cleared by the [place] FDA and  has been authorized for detection and/or diagnosis of SARS-CoV-2  by FDA under an Emergency Use Authorization (EUA). This EUA will remain  in effect (meaning this test can be used) for the duration of the COVID-19 declaration under Section 564(b)(1) of the Act, 21 U.S.C.section 360bbb-3(b)(1), unless the authorization is terminated  or revoked sooner.       Influenza A by PCR NEGATIVE NEGATIVE Final   Influenza B by PCR NEGATIVE NEGATIVE Final    Comment: (NOTE) The Xpert Xpress SARS-CoV-2/FLU/RSV plus assay is intended as an aid in the diagnosis of influenza from Nasopharyngeal swab specimens and should not be used as a sole basis for  treatment. Nasal washings and aspirates are unacceptable for Xpert Xpress SARS-CoV-2/FLU/RSV testing.  Fact Sheet for Patients: BloggerCourse.com  Fact Sheet for Healthcare Providers: SeriousBroker.it  This test is not yet approved or cleared by the [place] FDA and has been authorized for detection and/or diagnosis of SARS-CoV-2 by FDA under an Emergency Use Authorization (EUA). This EUA will remain in effect (meaning this test can be used) for the duration of the COVID-19 declaration under Section 564(b)(1) of the Act, 21 U.S.C. section 360bbb-3(b)(1), unless the authorization is terminated or revoked.  Performed at [facility], [address] [place], [place] [address]     Labs: CBC: Recent Labs  Lab 07/02/22 1145 07/02/22 1146  HGB 10.9* 11.2*  HCT 32.0* 33.0*   Basic Metabolic Panel: Recent Labs  Lab 06/29/22 0447 06/30/22 0449 07/01/22 0416 07/02/22 0411 07/02/22 1145 07/02/22 1146 07/03/22 0429  NA 136 138 139 140 139 139 136  K 4.3 3.4* 3.3* 3.9 3.9 3.9 3.4*  CL 103 100 102 101  --   --  99  CO2 23 25 26 25   --   --  24  GLUCOSE 154* 193* 125* 112*  --   --  120*  BUN 20 24* 19 21  --   --  21  CREATININE 1.82* 1.92* 1.87* 1.99*  --   --  2.12*  CALCIUM 8.9 8.6* 8.6* 9.5  --   --  8.6*  MG  --   --  1.7   --   --   --  2.0   Liver Function Tests: No results for input(s): "AST", "ALT", "ALKPHOS", "BILITOT", "PROT", "ALBUMIN" in the last 168 hours. CBG: Recent Labs  Lab 07/02/22 1035 07/02/22 1237 07/02/22 1622 07/02/22 2100 07/03/22 0606  GLUCAP 116* 117* 158* 164* 109*    Discharge time spent: greater than 30 minutes.  Signed: Coralie Keens, MD [facility] 07/03/2022

## 2022-07-06 DIAGNOSIS — Z7901 Long term (current) use of anticoagulants: Secondary | ICD-10-CM | POA: Diagnosis not present

## 2022-07-06 DIAGNOSIS — Z87891 Personal history of nicotine dependence: Secondary | ICD-10-CM | POA: Diagnosis not present

## 2022-07-06 DIAGNOSIS — I4819 Other persistent atrial fibrillation: Secondary | ICD-10-CM | POA: Diagnosis not present

## 2022-07-06 DIAGNOSIS — I5043 Acute on chronic combined systolic (congestive) and diastolic (congestive) heart failure: Secondary | ICD-10-CM | POA: Diagnosis not present

## 2022-07-06 DIAGNOSIS — E1136 Type 2 diabetes mellitus with diabetic cataract: Secondary | ICD-10-CM | POA: Diagnosis not present

## 2022-07-06 DIAGNOSIS — Z91148 Patient's other noncompliance with medication regimen for other reason: Secondary | ICD-10-CM | POA: Diagnosis not present

## 2022-07-06 DIAGNOSIS — E1169 Type 2 diabetes mellitus with other specified complication: Secondary | ICD-10-CM | POA: Diagnosis not present

## 2022-07-06 DIAGNOSIS — Z7984 Long term (current) use of oral hypoglycemic drugs: Secondary | ICD-10-CM | POA: Diagnosis not present

## 2022-07-06 DIAGNOSIS — E039 Hypothyroidism, unspecified: Secondary | ICD-10-CM | POA: Diagnosis not present

## 2022-07-06 DIAGNOSIS — I082 Rheumatic disorders of both aortic and tricuspid valves: Secondary | ICD-10-CM | POA: Diagnosis not present

## 2022-07-06 DIAGNOSIS — I701 Atherosclerosis of renal artery: Secondary | ICD-10-CM | POA: Diagnosis not present

## 2022-07-06 DIAGNOSIS — I2510 Atherosclerotic heart disease of native coronary artery without angina pectoris: Secondary | ICD-10-CM | POA: Diagnosis not present

## 2022-07-06 DIAGNOSIS — E1151 Type 2 diabetes mellitus with diabetic peripheral angiopathy without gangrene: Secondary | ICD-10-CM | POA: Diagnosis not present

## 2022-07-06 DIAGNOSIS — J449 Chronic obstructive pulmonary disease, unspecified: Secondary | ICD-10-CM | POA: Diagnosis not present

## 2022-07-06 DIAGNOSIS — E1122 Type 2 diabetes mellitus with diabetic chronic kidney disease: Secondary | ICD-10-CM | POA: Diagnosis not present

## 2022-07-06 DIAGNOSIS — I130 Hypertensive heart and chronic kidney disease with heart failure and stage 1 through stage 4 chronic kidney disease, or unspecified chronic kidney disease: Secondary | ICD-10-CM | POA: Diagnosis not present

## 2022-07-06 DIAGNOSIS — Z9181 History of falling: Secondary | ICD-10-CM | POA: Diagnosis not present

## 2022-07-06 DIAGNOSIS — M519 Unspecified thoracic, thoracolumbar and lumbosacral intervertebral disc disorder: Secondary | ICD-10-CM | POA: Diagnosis not present

## 2022-07-06 DIAGNOSIS — K219 Gastro-esophageal reflux disease without esophagitis: Secondary | ICD-10-CM | POA: Diagnosis not present

## 2022-07-06 DIAGNOSIS — Z7902 Long term (current) use of antithrombotics/antiplatelets: Secondary | ICD-10-CM | POA: Diagnosis not present

## 2022-07-06 DIAGNOSIS — E785 Hyperlipidemia, unspecified: Secondary | ICD-10-CM | POA: Diagnosis not present

## 2022-07-06 DIAGNOSIS — N1832 Chronic kidney disease, stage 3b: Secondary | ICD-10-CM | POA: Diagnosis not present

## 2022-07-08 ENCOUNTER — Telehealth (HOSPITAL_COMMUNITY): Payer: Self-pay | Admitting: Emergency Medicine

## 2022-07-08 ENCOUNTER — Telehealth (HOSPITAL_COMMUNITY): Payer: Self-pay

## 2022-07-08 NOTE — Telephone Encounter (Signed)
Called to confirm/remind patient of their appointment at the [facility] on 07/09/22.   A message was also left for patient to bring all medications and/or complete list.  .

## 2022-07-08 NOTE — Progress Notes (Incomplete)
ADVANCED HF CLINIC CONSULT NOTE Primary Care: Dr. Fara Olden HF Cardiologist: Dr. Aundra Dubin  HPI: Albert Powell  is 81 y.o. male with a history PAF, CAD s/p stenting x2 to RCA in 2003 with residual LAD disease, carotid artery disease s/p carotid artery stenosis s/p LICA endarterectomy 09/9415, peripheral arterial disease, hypertension, hyperlipidemia, T2DM, renal artery stenosis.   2003 required PCI x2 to RCA    Admitted 12/2020 for hypertensive emergency, flash pulmonary edema, A. fib with RVR due to poor medication adherence.  Home meds were restarted, given one dose of IV lasix 40mg  and he returned back to baseline.     Admitted 12/812 with A/C diastolic heart failure and HTN urgency. Diuresed with IV lasix.    Initially seen in [facility] 03/21/21 and was started on Farxiga and Lasix changed to PRN.    Admitted 07/2021 A/C HFpEF and hypertension. Diuresed with IV lasix and transitioned to lasix 20 mg daily. Discharge weight 218 pounds. Referred to back to [facility] clinic. At follow up, he was mildly fluid overloaded and diuretics increased, but ultimately referred back to Dr. Gwenlyn Found for further management.   Admitted 4/81 with a/c diastolic CHF, in the setting of hypertensive urgency and atrial fibrillation w/ RVR. Placed on IV Lasix and nitro gtt.  Echo showed normal LVEF 50-55%, G1DD, RV normal. No significant valvular dysfunction. Transitioned back to PO Lasix. Afib rate controlled w/ beta blocker. Referred back to [facility] clinic. D/c wt 224 lb. At follow up visit, discussion was held regarding referral to [facility] for RHC and possible CardioMems, but unfortunately pt failed to f/u after.    Since then, he has had multiple readmissions and ED visits for a/c CHF and hypertensive urgencies.    Admitted 8/23 w/ a/c CHF. Echo showed drop in EF, down to 25-30%, RV normal. Noted to be back in Afib w/ RVR when echo was done.    Admitted 9/23 for HF. R/LHC was done and showed mild to moderate obstructive coronary  artery disease. CM out of proportion to degree of CAD. RHC showed normal filling pressures and perserved CO, CI 2.7 L/min/m.   Admitted 10/23 with a/c CHF, hypertensive urgency and atrial fibrillation. Started on IV Lasix and NTG gtt. He converted to NSR on oral amiodarone. Renal u/s showed atrophic lt kidney, likely secondary to chronic RAS. Renal artery doppler of the right kidney with mild-moderate stenosis. He underwent Cardiomems implantation 07/02/22. GDMT adjusted and he was discharged home, weight 211 lbs.  Today he returns for post hospital HF follow up. Overall feeling fine. Denies increasing SOB, CP, dizziness, edema, or PND/Orthopnea. Appetite ok. No fever or chills. Weight at home 170 pounds. Taking all medications.    ECG (personally reviewed):  Labs (10/23): K 3.4, creatinine 2.12   Cardiac Studies: - RHC (Cardiomems implant 10/23): Mild pulmonary venous hypertension & mildly elevated PCWP RA mean 3, PA 45/21 (mean 32), PCWP mean 21, CO/CI 6.52/3.1, PVR 1.7 WU  - R/LHC (9/23): mild to moderate obs CAD, compared with the previous study the angiographic appearance looks relatively improved.  Overall the burden of coronary artery disease does not explain the patient's severe cardiomyopathy.   Prox LAD to Mid LAD lesion is 40% stenosed.   Prox Cx to Mid Cx lesion is 30% stenosed.   Prox RCA to Mid RCA lesion is 60% stenosed.   1st Diag lesion is 60% stenosed.   Dist LAD lesion is 40% stenosed.   Non-stenotic Ost RCA to Prox RCA lesion was previously treated.  RA mean 5, PCWP mean 5, CO/CI 5.9/2.7   - Echo (8/23): EF 25-30%, severe LV dysfunction, RV normal.  - Echo (3/23): EF 50-55%, G1DD, RV normal. No significant valvular dysfunction   Review of Systems: [y] = yes, [ ]  = no   General: Weight gain [ ] ; Weight loss [ ] ; Anorexia [ ] ; Fatigue [ ] ; Fever [ ] ; Chills [ ] ; Weakness [ ]   Cardiac: Chest pain/pressure [ ] ; Resting SOB [ ] ; Exertional SOB [ ] ; Orthopnea [ ] ;  Pedal Edema [ ] ; Palpitations [ ] ; Syncope [ ] ; Presyncope [ ] ; Paroxysmal nocturnal dyspnea[ ]   Pulmonary: Cough [ ] ; Wheezing[ ] ; Hemoptysis[ ] ; Sputum [ ] ; Snoring [ ]   GI: Vomiting[ ] ; Dysphagia[ ] ; Melena[ ] ; Hematochezia [ ] ; Heartburn[ ] ; Abdominal pain [ ] ; Constipation [ ] ; Diarrhea [ ] ; BRBPR [ ]   GU: Hematuria[ ] ; Dysuria [ ] ; Nocturia[ ]   Vascular: Pain in legs with walking [ ] ; Pain in feet with lying flat [ ] ; Non-healing sores [ ] ; Stroke [ ] ; TIA [ ] ; Slurred speech [ ] ;  Neuro: Headaches[ ] ; Vertigo[ ] ; Seizures[ ] ; Paresthesias[ ] ;Blurred vision [ ] ; Diplopia [ ] ; Vision changes [ ]   Ortho/Skin: Arthritis [ ] ; Joint pain [ ] ; Muscle pain [ ] ; Joint swelling [ ] ; Back Pain [ ] ; Rash [ ]   Psych: Depression[ ] ; Anxiety[ ]   Heme: Bleeding problems [ ] ; Clotting disorders [ ] ; Anemia [ ]   Endocrine: Diabetes [ ] ; Thyroid dysfunction[ ]    Past Medical History:  Diagnosis Date   Atrial fibrillation ([place]) 03/30/2020   Bradycardia, sinus 02/18/2013   CAD (coronary artery disease)    stent to RCA 2003 also had 40% lesion at that time; NUCLEAR STRESS TEST, 01/16/2010 - normal  now with cath 80-90% stenosis in LAD, will try medical therapy if no improvement PCI   Carotid artery disease ([name])    Cataract    CHF (congestive heart failure) ([place])    Chronic kidney disease (CKD), stage III (moderate) ([name])    Claudication ([place])    LEA DUPLEX, 07/07/2008 - Normal   COPD (chronic obstructive pulmonary disease) ([name])    DDD (degenerative disc disease) 2008   Diabetes mellitus    GERD (gastroesophageal reflux disease)    H/O hiatal hernia    History of colonoscopy 2004   finding of tics and AMV only no polpys   Hyperlipidemia 02/05/2013   Hypertension    Hypothyroidism    Obesity    Onychomycosis    PAD (peripheral artery disease) ([name])    Rosacea    Tobacco abuse 02/05/2013    Current Outpatient Medications  Medication Sig Dispense Refill   amiodarone (PACERONE) 200 MG tablet  Take 1 tablet (200 mg total) by mouth 2 (two) times daily. 60 tablet 0   apixaban (ELIQUIS) 2.5 MG TABS tablet Take 1 tablet (2.5 mg total) by mouth 2 (two) times daily. 60 tablet 0   atorvastatin (LIPITOR) 40 MG tablet Take 1 tablet (40 mg total) by mouth daily. 30 tablet 0   carvedilol (COREG) 25 MG tablet Take 1 tablet (25 mg total) by mouth 2 (two) times daily with a meal. 60 tablet 0   clopidogrel (PLAVIX) 75 MG tablet Take 1 tablet (75 mg total) by mouth daily. 30 tablet 1   dapagliflozin propanediol (FARXIGA) 10 MG TABS tablet Take 1 tablet (10 mg total) by mouth daily before breakfast. 30 tablet 0   glipiZIDE (GLUCOTROL) 5 MG tablet Take 5  mg by mouth daily before breakfast.     hydrALAZINE (APRESOLINE) 100 MG tablet Take 1 tablet (100 mg total) by mouth 3 (three) times daily. 90 tablet 0   isosorbide mononitrate (IMDUR) 120 MG 24 hr tablet Take 1 tablet (120 mg total) by mouth daily. 30 tablet 0   levothyroxine (SYNTHROID) 25 MCG tablet TAKE ONE TABLET BY MOUTH DAILY BEFORE BREAKFAST (Patient taking differently: Take 25 mcg by mouth daily before breakfast.) 90 tablet 3   pantoprazole (PROTONIX) 40 MG tablet Take 40 mg by mouth every other day.     spironolactone (ALDACTONE) 25 MG tablet Take 1 tablet (25 mg total) by mouth daily. 30 tablet 0   torsemide (DEMADEX) 20 MG tablet Take 2 tablets (40 mg total) by mouth daily. 120 tablet 0   No current facility-administered medications for this visit.    Allergies  Allergen Reactions   Fentanyl Other (See Comments)    Behavioral changes   Gabapentin Swelling   Lisinopril Other (See Comments)    unknown   Lyrica [Pregabalin] Other (See Comments)    unknown   Metformin Diarrhea   Propofol Other (See Comments)    Heart rate dropped      Social History   Socioeconomic History   Marital status: Married    Spouse name: Eduard Pasqualone   Number of children: 1   Years of education: Not on file   Highest education level: High school  graduate  Occupational History   Occupation: retired  Tobacco Use   Smoking status: Former    Packs/day: 1.50    Years: 57.00    Total pack years: 85.50    Types: Cigarettes, E-cigarettes    Quit date: 2020    Years since quitting: 3.8   Smokeless tobacco: Never   Tobacco comments:    pt states that he is using the vapor cigs--12/2021  Vaping Use   Vaping Use: Former   Start date: 09/15/2016   Substances: Nicotine  Substance and Sexual Activity   Alcohol use: Yes    Alcohol/week: 1.0 standard drink of alcohol    Types: 1 Glasses of wine per week    Comment: "very little"   Drug use: No   Sexual activity: Not on file  Other Topics Concern   Not on file  Social History Narrative   Not on file   Social Determinants of Health   Financial Resource Strain: Low Risk  (06/03/2022)   Overall Financial Resource Strain (CARDIA)    Difficulty of Paying Living Expenses: Not hard at all  Food Insecurity: No Food Insecurity (07/02/2022)   Hunger Vital Sign    Worried About Running Out of Food in the Last Year: Never true    Ran Out of Food in the Last Year: Never true  Transportation Needs: No Transportation Needs (07/02/2022)   PRAPARE - Hydrologist (Medical): No    Lack of Transportation (Non-Medical): No  Physical Activity: Not on file  Stress: Not on file  Social Connections: Not on file  Intimate Partner Violence: Not At Risk (07/02/2022)   Humiliation, Afraid, Rape, and Kick questionnaire    Fear of Current or Ex-Partner: No    Emotionally Abused: No    Physically Abused: No    Sexually Abused: No      Family History  Problem Relation Age of Onset   Heart disease Father        heart attack at 67   Heart attack Father  Hyperlipidemia Father    Colonic polyp Mother        that bled out   COPD Mother    Diabetes Mother    Heart disease Sister    Colonic polyp Sister    Stroke Maternal Grandfather    Heart attack Paternal Grandfather     Other Neg Hx        hypogonadism    There were no vitals filed for this visit.  PHYSICAL EXAM: General:  Well appearing. No respiratory difficulty HEENT: normal Neck: supple. no JVD. Carotids 2+ bilat; no bruits. No lymphadenopathy or thryomegaly appreciated. Cor: PMI nondisplaced. Regular rate & rhythm. No rubs, gallops or murmurs. Lungs: clear Abdomen: soft, nontender, nondistended. No hepatosplenomegaly. No bruits or masses. Good bowel sounds. Extremities: no cyanosis, clubbing, rash, edema Neuro: alert & oriented x 3, cranial nerves grossly intact. moves all 4 extremities w/o difficulty. Affect pleasant.  ECG:   ASSESSMENT & PLAN: 1. Acute on chronic systolic CHF: In setting of atrial fibrillation with mild RVR and initial uncontrolled HTN.  Echo in 8/23 showed EF 25-30% with normal RV, trivial Albert, dilated IVC.  This was significantly lower than in the past (EF 50-55% in 3/23).  LHC in 9/23 showed nonobstructive CAD, unable to explain fall in EF. He was in atrial fibrillation in 8/23, ?tachy-mediated CMP. Diuresed w/ IV Lasix on admit, then developed AKI, likely due from development of hypotension after antihypertensive titration + over-diuresis. Lasix held 10/12. On 10/13 developed flash pulmonary edema. S/P Cardiomems  . CVP 9-10. Overall diuresed 9 pounds. Continue torsemide 40 mg daily.  -Continue Coreg 25 mg bid.  - Continue amio to 200 mg twice a day.   - Off entresto w/ AKI and RAS. - Continue Farxiga 10 mg  - Continue po hydralazine 100 TID + Imdur 120 daily.  - Continue  spironolactone 25 mg daily  - He is agreeable to paramedicine in the community - Reinforced daily Cardiomems readings.  2. Atrial fibrillation: He appears to have been in atrial fibrillation (with RVR at times) since 8/23. In SR today.  - Continue amio 200 mg twice a day.  - apixaban 2.5 mg bid held for Cardiomems - Need to maintain NSR, will consider ablation down the road.  3. HTN/renal artery  stenosis: Patient was admitted with hypertensive emergency/CHF.  Patient has occluded left renal artery with atrophic left kidney.  By renal artery dopplers, right renal artery with >60% stenosis.  Reviewed this with Dr Gwenlyn Found, however, and he thinks that the degree of stenosis is actually less, more in the 40-50% range.  Will need to follow closely as it is unclear how much renal artery stenosis is contributing to HTN/flash pulmonary edema episodes.   - Will see Dr. Gwenlyn Found as outpatient, there will be consideration for CO2 angiography.  - Continue current regimen.   4. CAD: H/o PCI x 2 to RCA.  Cath in 8/23 after EF found to be low on echo did not explain his cardiomyopathy.   - Continue statin.  - He is on apixaban so do not think he needs to continue Plavix with no recent PCI.  5. PAD: He has severe right SFA and right EIA stenosis by 12/21 peripheral angiography.  Follows with Dr. Gwenlyn Found. No rest pain or pedal ulcerations.  6. AKI on CKD stage 3: Creatinine up to 2.4 from 1.57 on admit.  Scr 1.92 => 2 => 2.12 today. This may be due to significant renal artery stenosis +/- transient hypotension  after aggressively lowering BP   Ok for d/c  HF meds Torsemide 40 mg daily  Spironolactone 25 mg daily  Imdur 120 mg daily Hydralazine 100 mg tid Farxiga 10 mg daily Carvedilol 25 mg twice a day Amiodarone 200 mg twice a day x 1 week then once daily after that.  Eliquis 2.5 mg twice a day  Atorvastatin 40 mg daily

## 2022-07-08 NOTE — Telephone Encounter (Signed)
Called and spoke to Brynden Thune (wife).  She advised that Mr. Pouliot was in the bed asleep.  I introduced myself to her and scheduled a new pt visit for tomorrow @ 9:00.  He does not have a pill box or scale and I told her I would bring those items for him in the morning.    [name], [place] 07/08/2022

## 2022-07-09 ENCOUNTER — Other Ambulatory Visit (HOSPITAL_COMMUNITY): Payer: Self-pay | Admitting: Emergency Medicine

## 2022-07-09 ENCOUNTER — Encounter (HOSPITAL_COMMUNITY): Payer: Medicare Other

## 2022-07-09 ENCOUNTER — Telehealth (HOSPITAL_COMMUNITY): Payer: Self-pay | Admitting: *Deleted

## 2022-07-09 DIAGNOSIS — I082 Rheumatic disorders of both aortic and tricuspid valves: Secondary | ICD-10-CM | POA: Diagnosis not present

## 2022-07-09 DIAGNOSIS — E1122 Type 2 diabetes mellitus with diabetic chronic kidney disease: Secondary | ICD-10-CM | POA: Diagnosis not present

## 2022-07-09 DIAGNOSIS — I130 Hypertensive heart and chronic kidney disease with heart failure and stage 1 through stage 4 chronic kidney disease, or unspecified chronic kidney disease: Secondary | ICD-10-CM | POA: Diagnosis not present

## 2022-07-09 DIAGNOSIS — I5043 Acute on chronic combined systolic (congestive) and diastolic (congestive) heart failure: Secondary | ICD-10-CM | POA: Diagnosis not present

## 2022-07-09 DIAGNOSIS — N1832 Chronic kidney disease, stage 3b: Secondary | ICD-10-CM | POA: Diagnosis not present

## 2022-07-09 DIAGNOSIS — I4819 Other persistent atrial fibrillation: Secondary | ICD-10-CM | POA: Diagnosis not present

## 2022-07-09 NOTE — Progress Notes (Signed)
Did a quick visit home visit for BP check this afternoon due to the elevated pressure at the morning visit.  Sent this information to the clinic for any desired changes. Provided pt with a larger pill box so that he could include a noon time dose of his Hydralazine.  Meds moved into new box without incident.    [name], [place] 07/09/2022

## 2022-07-09 NOTE — Telephone Encounter (Signed)
Dede with paramedicine called to report pts bp before meds was 240/108 left arm and 180/1000 in right arm. Pts wrist cuff showed 186/77. Pt was asymptomatic. I called pt an hour after taking meds and pt said his bp on his wrist cuff was 121/62. Dede went back to the patients home and reported his actual bp was 180/100.   Routed to [facility] for advice

## 2022-07-09 NOTE — Progress Notes (Addendum)
Paramedicine Encounter    Patient ID: Albert Powell, male    DOB: [birth year], 81 y.o.   MRN: [id number]   There were no vitals taken for this visit. Weight yesterday-not taken Last visit weight-211.6lbs CBG 132  Initial visit with Albert Powell today.  He was still in bed when I arrived and got up without difficulty.  He was A&O x 4, skin warm and dry with good color.  Pt denies chest pain.  He does have exertional SOB that he says if baseline from him.  Lung sounds clear and equal bilat.  No edema noted to his lower extremities.  His hands did look rather puffy.  He was found to be extremely hypertensive today and has not taken his morning meds.  Due to the elevated BP sent message to HF Clinic Triage for any advised changes.  Med box reconciled but the doors on the box were broken and only held AM/PM meds.  I advised pt's wife that I would get a pill box from the clinic and return with it later today as well as do a blood pressure re-check. Did a cardiomems reading today with his machine.  CHP Spencer assisted me with this by phone. His reading today was 7.  He was instructed to do this every day.  I will reach out to him tomorrow to remind him to do so. Mr Powell was supposed to have an appointment at the HF Clinic this morning but cancelled due to not feeling well.  I advised him during this visit that he needed to reschedule his follow up visit and he said he would do so.  Home visit complete.    Albert Powell, [place] 07/09/2022   Patient Care Team: Patient, No Pcp Per as PCP - General ([place]) Lorretta Harp, MD as PCP - Cardiology (Cardiology) Lorretta Harp, MD as Consulting Physician (Cardiology)  Patient Active Problem List   Diagnosis Date Noted   Hypokalemia 06/23/2022   Elevated troponin 06/23/2022   Type 2 diabetes mellitus with hyperlipidemia ([place]) 05/19/2022   Acute combined systolic and diastolic heart failure ([place])    Acute kidney  injury superimposed on chronic kidney disease ([place]) 05/13/2022   GERD (gastroesophageal reflux disease) 05/13/2022   Acquired hypothyroidism 05/13/2022   Acute on chronic systolic CHF (congestive heart failure) ([place]) 11/23/2021   PAF (paroxysmal atrial fibrillation) ([place]) 10/08/2021   Acute on chronic diastolic heart failure ([place]) 70/35/0093   Acute metabolic encephalopathy    CHF (congestive heart failure) ([place]) 03/10/2021   Uncontrolled hypertension 03/10/2021   Hypertensive crisis 01/01/2021   ACS (acute coronary syndrome) ([place])    AKI (acute kidney injury) ([place])    Atrial fibrillation ([place]) 03/30/2020   Contusion of right hand 11/12/2018   Primary osteoarthritis of both first carpometacarpal joints 11/12/2018   Pain in joint of right shoulder 10/05/2018   Pain in right hand 10/05/2018   Hematoma 03/30/2018   Degeneration of lumbar intervertebral disc 12/21/2017   Lumbar radiculopathy 12/21/2017   Vertigo 04/09/2017   Hypogonadism in male 12/07/2014   Screening for prostate cancer 12/07/2014   Claudication of right lower extremity ([place]) 10/20/2013   Claudication in peripheral vascular disease- Rt leg 09/20/2013   Obesity (BMI 30-39.9)- negative sleep study in the past 09/20/2013   Occlusion and stenosis of carotid artery without mention of cerebral infarction 05/23/2013   PAD (peripheral artery disease) ([place]) 05/18/2013   Carotid artery disease ([place]) 04/14/2013   Bradycardia, sinus 02/18/2013  Tobacco abuse 02/05/2013   Chest pain, unstable angina, negative MI 02/04/2013   Type 2 diabetes mellitus with stage 3b chronic kidney disease ([place]) 02/04/2013   Coronary artery disease involving native coronary artery of native heart without angina pectoris 02/04/2013   Benign essential hypertension 07/10/2011   Incisional hernia 05/22/2011    Current Outpatient Medications:    amiodarone (PACERONE) 200 MG tablet, Take 1 tablet (200 mg total) by mouth 2 (two) times daily., Disp: 60  tablet, Rfl: 0   apixaban (ELIQUIS) 2.5 MG TABS tablet, Take 1 tablet (2.5 mg total) by mouth 2 (two) times daily., Disp: 60 tablet, Rfl: 0   atorvastatin (LIPITOR) 40 MG tablet, Take 1 tablet (40 mg total) by mouth daily., Disp: 30 tablet, Rfl: 0   carvedilol (COREG) 25 MG tablet, Take 1 tablet (25 mg total) by mouth 2 (two) times daily with a meal., Disp: 60 tablet, Rfl: 0   clopidogrel (PLAVIX) 75 MG tablet, Take 1 tablet (75 mg total) by mouth daily., Disp: 30 tablet, Rfl: 1   dapagliflozin propanediol (FARXIGA) 10 MG TABS tablet, Take 1 tablet (10 mg total) by mouth daily before breakfast., Disp: 30 tablet, Rfl: 0   glipiZIDE (GLUCOTROL) 5 MG tablet, Take 5 mg by mouth daily before breakfast., Disp: , Rfl:    hydrALAZINE (APRESOLINE) 100 MG tablet, Take 1 tablet (100 mg total) by mouth 3 (three) times daily., Disp: 90 tablet, Rfl: 0   isosorbide mononitrate (IMDUR) 120 MG 24 hr tablet, Take 1 tablet (120 mg total) by mouth daily., Disp: 30 tablet, Rfl: 0   levothyroxine (SYNTHROID) 25 MCG tablet, TAKE ONE TABLET BY MOUTH DAILY BEFORE BREAKFAST (Patient taking differently: Take 25 mcg by mouth daily before breakfast.), Disp: 90 tablet, Rfl: 3   pantoprazole (PROTONIX) 40 MG tablet, Take 40 mg by mouth every other day., Disp: , Rfl:    spironolactone (ALDACTONE) 25 MG tablet, Take 1 tablet (25 mg total) by mouth daily., Disp: 30 tablet, Rfl: 0   torsemide (DEMADEX) 20 MG tablet, Take 2 tablets (40 mg total) by mouth daily., Disp: 120 tablet, Rfl: 0 Allergies  Allergen Reactions   Fentanyl Other (See Comments)    Behavioral changes   Gabapentin Swelling   Lisinopril Other (See Comments)    unknown   Lyrica [Pregabalin] Other (See Comments)    unknown   Metformin Diarrhea   Propofol Other (See Comments)    Heart rate dropped      Social History   Socioeconomic History   Marital status: Married    Spouse name: Albert Powell   Number of children: 1   Years of education: Not on  file   Highest education level: High school graduate  Occupational History   Occupation: retired  Tobacco Use   Smoking status: Former    Packs/day: 1.50    Years: 57.00    Total pack years: 85.50    Types: Cigarettes, E-cigarettes    Quit date: 2020    Years since quitting: 3.8   Smokeless tobacco: Never   Tobacco comments:    pt states that he is using the vapor cigs--12/2021  Vaping Use   Vaping Use: Former   Start date: 09/15/2016   Substances: Nicotine  Substance and Sexual Activity   Alcohol use: Yes    Alcohol/week: 1.0 standard drink of alcohol    Types: 1 Glasses of wine per week    Comment: "very little"   Drug use: No   Sexual activity: Not on  file  Other Topics Concern   Not on file  Social History Narrative   Not on file   Social Determinants of Health   Financial Resource Strain: Low Risk  (06/03/2022)   Overall Financial Resource Strain (CARDIA)    Difficulty of Paying Living Expenses: Not hard at all  Food Insecurity: No Food Insecurity (07/02/2022)   Hunger Vital Sign    Worried About Running Out of Food in the Last Year: Never true    Ran Out of Food in the Last Year: Never true  Transportation Needs: No Transportation Needs (07/02/2022)   PRAPARE - Hydrologist (Medical): No    Lack of Transportation (Non-Medical): No  Physical Activity: Not on file  Stress: Not on file  Social Connections: Not on file  Intimate Partner Violence: Not At Risk (07/02/2022)   Humiliation, Afraid, Rape, and Kick questionnaire    Fear of Current or Ex-Partner: No    Emotionally Abused: No    Physically Abused: No    Sexually Abused: No    Physical Exam      No future appointments.     Albert Powell, [place] Paramedic  07/09/22   SOCIAL/MEDICAL BARRIERS:  PHARMACY USED upstream  PCP none   INSURANCE BCBS Supplement Medicare part A&B  MED ISSUES:  AFFORDABILITY none  PT ASSIST APPS  NEEDED NONE  SOURCE OF INCOME SSI  TRANSPORTATION yest  FOOD INSECURITIES/NEEDSone FOOD STAMPS NONE  REVIEWED DIET/FLUID/SALT RESTRICTIONS: AWARE AND REVIEWED  RENT/OWN HOME ISSUES RENT/NO ISSUES  SOCIAL SUPPORT NONE  SAFETY/DOMESTIC ISSUES NONE   SUBSTANCE ABUSE NONE  DAILY WEIGHTS WILL START  EDUCATE ON DISEASE PROCESS/SYMPTOMS/PURPOSES OF MEDS YES

## 2022-07-11 ENCOUNTER — Other Ambulatory Visit (HOSPITAL_COMMUNITY): Payer: Self-pay | Admitting: Emergency Medicine

## 2022-07-11 MED ORDER — AMLODIPINE BESYLATE 10 MG PO TABS: 10.0000 mg | ORAL_TABLET | Freq: Every day | ORAL | 11 refills | Status: DC

## 2022-07-11 NOTE — Progress Notes (Addendum)
Paramedicine Encounter    Patient ID: Albert Powell, male    DOB: May 23, 1941, 81 y.o.   MRN: [id number]   BP (!) 210/98 (BP Location: Left Arm, Patient Position: Sitting, Cuff Size: Normal)   Resp (!) 26   Wt 210 lb 3.2 oz (95.3 kg)   BMI 31.04 kg/m  Weight yesterday- Last visit weight-208.8  Quick home visit today for BP check and cardiomems.  He is still hypertensive today.  He just took his morning meds on my arrival as he was still in the bed.  He denies SOB but looks SOB w/ accessory muscle use on exertion.  He is up almost 3lbs from yesterday.  Reached out to HF triage regarding pt vitals.  Received text advising prescription to be called in for Amlodopine 10mg  daily per Keturah Shavers, NP.  Also assisted making pt a follow-up appt w/ HF clinic for 11/20 @ 10:30 as he cancelled his appointment for 10/25. Suggested pt an alarm in the mornings to get up and take his meds and he advised he will do same.  I will followup w/ [facility] to ensure the prescription gets delivered today. Home visit complete.    [name], [place] 07/11/2022   Patient Care Team: Patient, No Pcp Per as PCP - General ([place]) Lorretta Harp, MD as PCP - Cardiology (Cardiology) Lorretta Harp, MD as Consulting Physician (Cardiology)  Patient Active Problem List   Diagnosis Date Noted   Hypokalemia 06/23/2022   Elevated troponin 06/23/2022   Type 2 diabetes mellitus with hyperlipidemia ([place]) 05/19/2022   Acute combined systolic and diastolic heart failure ([name])    Acute kidney injury superimposed on chronic kidney disease ([place]) 05/13/2022   GERD (gastroesophageal reflux disease) 05/13/2022   Acquired hypothyroidism 05/13/2022   Acute on chronic systolic CHF (congestive heart failure) ([place]) 11/23/2021   PAF (paroxysmal atrial fibrillation) ([place]) 10/08/2021   Acute on chronic diastolic heart failure ([place]) 93/71/6967   Acute metabolic encephalopathy    CHF  (congestive heart failure) ([place]) 03/10/2021   Uncontrolled hypertension 03/10/2021   Hypertensive crisis 01/01/2021   ACS (acute coronary syndrome) ([place])    AKI (acute kidney injury) ([place])    Atrial fibrillation ([place]) 03/30/2020   Contusion of right hand 11/12/2018   Primary osteoarthritis of both first carpometacarpal joints 11/12/2018   Pain in joint of right shoulder 10/05/2018   Pain in right hand 10/05/2018   Hematoma 03/30/2018   Degeneration of lumbar intervertebral disc 12/21/2017   Lumbar radiculopathy 12/21/2017   Vertigo 04/09/2017   Hypogonadism in male 12/07/2014   Screening for prostate cancer 12/07/2014   Claudication of right lower extremity ([place]) 10/20/2013   Claudication in peripheral vascular disease- Rt leg 09/20/2013   Obesity (BMI 30-39.9)- negative sleep study in the past 09/20/2013   Occlusion and stenosis of carotid artery without mention of cerebral infarction 05/23/2013   PAD (peripheral artery disease) ([place]) 05/18/2013   Carotid artery disease ([place]) 04/14/2013   Bradycardia, sinus 02/18/2013   Tobacco abuse 02/05/2013   Chest pain, unstable angina, negative MI 02/04/2013   Type 2 diabetes mellitus with stage 3b chronic kidney disease ([place]) 02/04/2013   Coronary artery disease involving native coronary artery of native heart without angina pectoris 02/04/2013   Benign essential hypertension 07/10/2011   Incisional hernia 05/22/2011    Current Outpatient Medications:    amiodarone (PACERONE) 200 MG tablet, Take 1 tablet (200 mg total) by mouth 2 (two) times daily., Disp: 60 tablet, Rfl:  0   amLODipine (NORVASC) 10 MG tablet, Take 1 tablet (10 mg total) by mouth daily., Disp: 30 tablet, Rfl: 11   apixaban (ELIQUIS) 2.5 MG TABS tablet, Take 1 tablet (2.5 mg total) by mouth 2 (two) times daily., Disp: 60 tablet, Rfl: 0   atorvastatin (LIPITOR) 40 MG tablet, Take 1 tablet (40 mg total) by mouth daily., Disp: 30 tablet, Rfl: 0   carvedilol (COREG) 25 MG  tablet, Take 1 tablet (25 mg total) by mouth 2 (two) times daily with a meal., Disp: 60 tablet, Rfl: 0   clopidogrel (PLAVIX) 75 MG tablet, Take 1 tablet (75 mg total) by mouth daily., Disp: 30 tablet, Rfl: 1   dapagliflozin propanediol (FARXIGA) 10 MG TABS tablet, Take 1 tablet (10 mg total) by mouth daily before breakfast., Disp: 30 tablet, Rfl: 0   glipiZIDE (GLUCOTROL) 5 MG tablet, Take 5 mg by mouth daily before breakfast., Disp: , Rfl:    hydrALAZINE (APRESOLINE) 100 MG tablet, Take 1 tablet (100 mg total) by mouth 3 (three) times daily., Disp: 90 tablet, Rfl: 0   isosorbide mononitrate (IMDUR) 120 MG 24 hr tablet, Take 1 tablet (120 mg total) by mouth daily., Disp: 30 tablet, Rfl: 0   levothyroxine (SYNTHROID) 25 MCG tablet, TAKE ONE TABLET BY MOUTH DAILY BEFORE BREAKFAST (Patient taking differently: Take 25 mcg by mouth daily before breakfast.), Disp: 90 tablet, Rfl: 3   pantoprazole (PROTONIX) 40 MG tablet, Take 40 mg by mouth every other day., Disp: , Rfl:    spironolactone (ALDACTONE) 25 MG tablet, Take 1 tablet (25 mg total) by mouth daily., Disp: 30 tablet, Rfl: 0   torsemide (DEMADEX) 20 MG tablet, Take 2 tablets (40 mg total) by mouth daily., Disp: 120 tablet, Rfl: 0 Allergies  Allergen Reactions   Fentanyl Other (See Comments)    Behavioral changes   Gabapentin Swelling   Lisinopril Other (See Comments)    unknown   Lyrica [Pregabalin] Other (See Comments)    unknown   Metformin Diarrhea   Propofol Other (See Comments)    Heart rate dropped      Social History   Socioeconomic History   Marital status: Married    Spouse name: Kinan Safley   Number of children: 1   Years of education: Not on file   Highest education level: High school graduate  Occupational History   Occupation: retired  Tobacco Use   Smoking status: Former    Packs/day: 1.50    Years: 57.00    Total pack years: 85.50    Types: Cigarettes, E-cigarettes    Quit date: 2020    Years since  quitting: 3.8   Smokeless tobacco: Never   Tobacco comments:    pt states that he is using the vapor cigs--12/2021  Vaping Use   Vaping Use: Former   Start date: 09/15/2016   Substances: Nicotine  Substance and Sexual Activity   Alcohol use: Yes    Alcohol/week: 1.0 standard drink of alcohol    Types: 1 Glasses of wine per week    Comment: "very little"   Drug use: No   Sexual activity: Not on file  Other Topics Concern   Not on file  Social History Narrative   Not on file   Social Determinants of Health   Financial Resource Strain: Low Risk  (06/03/2022)   Overall Financial Resource Strain (CARDIA)    Difficulty of Paying Living Expenses: Not hard at all  Food Insecurity: No Food Insecurity (07/02/2022)  Hunger Vital Sign    Worried About Running Out of Food in the Last Year: Never true    Ran Out of Food in the Last Year: Never true  Transportation Needs: No Transportation Needs (07/02/2022)   PRAPARE - Administrator, Civil Service (Medical): No    Lack of Transportation (Non-Medical): No  Physical Activity: Not on file  Stress: Not on file  Social Connections: Not on file  Intimate Partner Violence: Not At Risk (07/02/2022)   Humiliation, Afraid, Rape, and Kick questionnaire    Fear of Current or Ex-Partner: No    Emotionally Abused: No    Physically Abused: No    Sexually Abused: No    Physical Exam      Future Appointments  Date Time Provider Department Center  08/04/2022 10:30 AM [facility] PA/NP [facility] None       Beatrix Shipper, EMT-Paramedic [phone] [facility] Paramedic  07/11/22

## 2022-07-11 NOTE — Telephone Encounter (Signed)
Pts bp 210/98 per Janett Billow [place],FNP add amlodipine 10mg  daily. Dede aware.

## 2022-07-16 ENCOUNTER — Other Ambulatory Visit (HOSPITAL_COMMUNITY): Payer: Self-pay | Admitting: Emergency Medicine

## 2022-07-16 DIAGNOSIS — I130 Hypertensive heart and chronic kidney disease with heart failure and stage 1 through stage 4 chronic kidney disease, or unspecified chronic kidney disease: Secondary | ICD-10-CM | POA: Diagnosis not present

## 2022-07-16 DIAGNOSIS — I082 Rheumatic disorders of both aortic and tricuspid valves: Secondary | ICD-10-CM | POA: Diagnosis not present

## 2022-07-16 DIAGNOSIS — I5043 Acute on chronic combined systolic (congestive) and diastolic (congestive) heart failure: Secondary | ICD-10-CM | POA: Diagnosis not present

## 2022-07-16 DIAGNOSIS — I4819 Other persistent atrial fibrillation: Secondary | ICD-10-CM | POA: Diagnosis not present

## 2022-07-16 DIAGNOSIS — N1832 Chronic kidney disease, stage 3b: Secondary | ICD-10-CM | POA: Diagnosis not present

## 2022-07-16 DIAGNOSIS — E1122 Type 2 diabetes mellitus with diabetic chronic kidney disease: Secondary | ICD-10-CM | POA: Diagnosis not present

## 2022-07-16 NOTE — Progress Notes (Signed)
Paramedicine Encounter    Patient ID: Albert Powell, male    DOB: Dec 19, 1940, 81 y.o.   MRN: [id number]   BP (!) 220/110 (BP Location: Left Arm, Patient Position: Sitting, Cuff Size: Normal) Comment (BP Location): left arm  Pulse 64   Resp 16   SpO2 98%  Weight yesterday-not taken Last visit weight-210lb  [name] was still in bed on my arrival.  I reconciled his medications before he got up. [facility] delivered Eliquis 5mg . Pt. Box reconciled with 2.5mg  BID.  Looks like this dosage was discontinued and the pharmacy had it on automatic refill.  Willl reach out to clinic to get prescription refill on 2.5mg  dosage.   When [name] woke up he reports to feeling ok.  No complaints of chest pains or SOB  Lung sounds clear and equal bilat,  No edema noted.  Reviewed for a second time how to set up Cardiomems pillow for reading.  Pt's grand daughter was present during the visit and she also observed the use of the Cardiomems pillow.  Reading completed and successfully transmitted.  Advised him that he needs to do a reading every day.  Home visit complete.    Renee Albert Powell, [place] 07/16/2022   Patient Care Team: Patient, No Pcp Per as PCP - General ([place]) Albert Harp, MD as PCP - Cardiology (Cardiology) Albert Harp, MD as Consulting Physician (Cardiology)  Patient Active Problem List   Diagnosis Date Noted   Hypokalemia 06/23/2022   Elevated troponin 06/23/2022   Type 2 diabetes mellitus with hyperlipidemia ([place]) 05/19/2022   Acute combined systolic and diastolic heart failure ([place])    Acute kidney injury superimposed on chronic kidney disease ([place]) 05/13/2022   GERD (gastroesophageal reflux disease) 05/13/2022   Acquired hypothyroidism 05/13/2022   Acute on chronic systolic CHF (congestive heart failure) ([place]) 11/23/2021   PAF (paroxysmal atrial fibrillation) ([place]) 10/08/2021   Acute on chronic diastolic heart failure ([place])  [id number]   Acute metabolic encephalopathy    CHF (congestive heart failure) ([place]) 03/10/2021   Uncontrolled hypertension 03/10/2021   Hypertensive crisis 01/01/2021   ACS (acute coronary syndrome) ([place])    AKI (acute kidney injury) ([place])    Atrial fibrillation ([place]) 03/30/2020   Contusion of right hand 11/12/2018   Primary osteoarthritis of both first carpometacarpal joints 11/12/2018   Pain in joint of right shoulder 10/05/2018   Pain in right hand 10/05/2018   Hematoma 03/30/2018   Degeneration of lumbar intervertebral disc 12/21/2017   Lumbar radiculopathy 12/21/2017   Vertigo 04/09/2017   Hypogonadism in male 12/07/2014   Screening for prostate cancer 12/07/2014   Claudication of right lower extremity ([place]) 10/20/2013   Claudication in peripheral vascular disease- Rt leg 09/20/2013   Obesity (BMI 30-39.9)- negative sleep study in the past 09/20/2013   Occlusion and stenosis of carotid artery without mention of cerebral infarction 05/23/2013   PAD (peripheral artery disease) ([place]) 05/18/2013   Carotid artery disease ([place]) 04/14/2013   Bradycardia, sinus 02/18/2013   Tobacco abuse 02/05/2013   Chest pain, unstable angina, negative MI 02/04/2013   Type 2 diabetes mellitus with stage 3b chronic kidney disease ([place]) 02/04/2013   Coronary artery disease involving native coronary artery of native heart without angina pectoris 02/04/2013   Benign essential hypertension 07/10/2011   Incisional hernia 05/22/2011    Current Outpatient Medications:    amiodarone (PACERONE) 200 MG tablet, Take 1 tablet (200 mg total) by mouth 2 (two) times daily., Disp: 60  tablet, Rfl: 0   amLODipine (NORVASC) 10 MG tablet, Take 1 tablet (10 mg total) by mouth daily., Disp: 30 tablet, Rfl: 11   carvedilol (COREG) 25 MG tablet, Take 1 tablet (25 mg total) by mouth 2 (two) times daily with a meal., Disp: 60 tablet, Rfl: 0   clopidogrel (PLAVIX) 75 MG tablet, Take 1 tablet (75 mg total) by mouth daily.,  Disp: 30 tablet, Rfl: 1   dapagliflozin propanediol (FARXIGA) 10 MG TABS tablet, Take 1 tablet (10 mg total) by mouth daily before breakfast., Disp: 30 tablet, Rfl: 0   glipiZIDE (GLUCOTROL) 5 MG tablet, Take 5 mg by mouth daily before breakfast., Disp: , Rfl:    hydrALAZINE (APRESOLINE) 100 MG tablet, Take 1 tablet (100 mg total) by mouth 3 (three) times daily., Disp: 90 tablet, Rfl: 0   isosorbide mononitrate (IMDUR) 120 MG 24 hr tablet, Take 1 tablet (120 mg total) by mouth daily., Disp: 30 tablet, Rfl: 0   levothyroxine (SYNTHROID) 25 MCG tablet, TAKE ONE TABLET BY MOUTH DAILY BEFORE BREAKFAST (Patient taking differently: Take 25 mcg by mouth daily before breakfast.), Disp: 90 tablet, Rfl: 3   pantoprazole (PROTONIX) 40 MG tablet, Take 40 mg by mouth every other day., Disp: , Rfl:    spironolactone (ALDACTONE) 25 MG tablet, Take 1 tablet (25 mg total) by mouth daily., Disp: 30 tablet, Rfl: 0   torsemide (DEMADEX) 20 MG tablet, Take 2 tablets (40 mg total) by mouth daily., Disp: 120 tablet, Rfl: 0   apixaban (ELIQUIS) 2.5 MG TABS tablet, Take 1 tablet (2.5 mg total) by mouth 2 (two) times daily., Disp: 60 tablet, Rfl: 0   atorvastatin (LIPITOR) 40 MG tablet, Take 1 tablet (40 mg total) by mouth daily., Disp: 30 tablet, Rfl: 0 Allergies  Allergen Reactions   Fentanyl Other (See Comments)    Behavioral changes   Gabapentin Swelling   Lisinopril Other (See Comments)    unknown   Lyrica [Pregabalin] Other (See Comments)    unknown   Metformin Diarrhea   Propofol Other (See Comments)    Heart rate dropped      Social History   Socioeconomic History   Marital status: Married    Spouse name: Albert Powell   Number of children: 1   Years of education: Not on file   Highest education level: High school graduate  Occupational History   Occupation: retired  Tobacco Use   Smoking status: Former    Packs/day: 1.50    Years: 57.00    Total pack years: 85.50    Types: Cigarettes,  E-cigarettes    Quit date: 2020    Years since quitting: 3.8   Smokeless tobacco: Never   Tobacco comments:    pt states that he is using the vapor cigs--12/2021  Vaping Use   Vaping Use: Former   Start date: 09/15/2016   Substances: Nicotine  Substance and Sexual Activity   Alcohol use: Yes    Alcohol/week: 1.0 standard drink of alcohol    Types: 1 Glasses of wine per week    Comment: "very little"   Drug use: No   Sexual activity: Not on file  Other Topics Concern   Not on file  Social History Narrative   Not on file   Social Determinants of Health   Financial Resource Strain: Low Risk  (06/03/2022)   Overall Financial Resource Strain (CARDIA)    Difficulty of Paying Living Expenses: Not hard at all  Food Insecurity: No Food Insecurity (07/02/2022)  Hunger Vital Sign    Worried About Running Out of Food in the Last Year: Never true    Ran Out of Food in the Last Year: Never true  Transportation Needs: No Transportation Needs (07/02/2022)   PRAPARE - Hydrologist (Medical): No    Lack of Transportation (Non-Medical): No  Physical Activity: Not on file  Stress: Not on file  Social Connections: Not on file  Intimate Partner Violence: Not At Risk (07/02/2022)   Humiliation, Afraid, Rape, and Kick questionnaire    Fear of Current or Ex-Partner: No    Emotionally Abused: No    Physically Abused: No    Sexually Abused: No    Physical Exam      Future Appointments  Date Time Provider [place]  08/04/2022 10:30 AM [facility] PA/NP [facility] None       Renee Albert Powell, [place] Paramedic  07/16/22

## 2022-07-17 ENCOUNTER — Telehealth (HOSPITAL_COMMUNITY): Payer: Self-pay | Admitting: Emergency Medicine

## 2022-07-17 NOTE — Telephone Encounter (Signed)
Ms. Albert Powell, Mr. [name] called me to discuss her dads condition and especially her mother's mental status suspecting possibly Alzheimer's and/or Dementia.   This was a 50 minute conversation w/ an outcome of possibly having Jenna reach out to her to help give further direction on options for her parent's care.    [name], [place] 07/17/2022

## 2022-07-22 ENCOUNTER — Other Ambulatory Visit (HOSPITAL_COMMUNITY): Payer: Self-pay

## 2022-07-22 ENCOUNTER — Other Ambulatory Visit (HOSPITAL_COMMUNITY): Payer: Self-pay | Admitting: Emergency Medicine

## 2022-07-22 ENCOUNTER — Telehealth (HOSPITAL_COMMUNITY): Payer: Self-pay

## 2022-07-22 MED ORDER — TORSEMIDE 20 MG PO TABS: 40.0000 mg | ORAL_TABLET | Freq: Every day | ORAL | 4 refills | Status: DC

## 2022-07-22 NOTE — Progress Notes (Unsigned)
Paramedicine Encounter    Patient ID: Albert Powell, male    DOB: February 18, 1941, 81 y.o.   MRN: [id number]   BP (!) 150/98 (BP Location: Left Arm, Patient Position: Sitting, Cuff Size: Normal)   Pulse 65   Wt 201 lb 6.4 oz (91.4 kg)   SpO2 97%   BMI 29.74 kg/m  Weight yesterday-not taken Last visit weight-210.3lb  ATF Mr. Weidner still in bed. Had to get him up to do visit.  He said he got up earlier this morning and took his morning dose of meds and went back to bed.  He says he feels sleepy all the time.  He denies chest pain or SOB.  He is down 9lbs this week.  Reviewed his pill box to discover he has only taken his morning meds for the past week. His wife is trying to help him but I am very concerned about her ability to help him and be consistent.  Mrs. Dunlevy seems to have issues with remembering things.  She asks questions repeatedly.  I gave Mr. Haub his noon medication which she witnessed me doing and asked me 3 more times before I left if she needed to give him his noon dose.   Mr Holroyd's blood pressure is running high today as it has been for the last two visits.  Sent message for any med changes.  Also requested refill on his Torsemide that was last filled from [place].   Home visit complete.    [name], [place] 07/22/2022   Patient Care Team: Patient, No Pcp Per as PCP - General ([place]) Lorretta Harp, MD as PCP - Cardiology (Cardiology) Lorretta Harp, MD as Consulting Physician (Cardiology)  Patient Active Problem List   Diagnosis Date Noted   Hypokalemia 06/23/2022   Elevated troponin 06/23/2022   Type 2 diabetes mellitus with hyperlipidemia ([place]) 05/19/2022   Acute combined systolic and diastolic heart failure ([place])    Acute kidney injury superimposed on chronic kidney disease ([place]) 05/13/2022   GERD (gastroesophageal reflux disease) 05/13/2022   Acquired hypothyroidism 05/13/2022   Acute on chronic systolic CHF  (congestive heart failure) ([place]) 11/23/2021   PAF (paroxysmal atrial fibrillation) ([place]) 10/08/2021   Acute on chronic diastolic heart failure ([place]) 31/51/7616   Acute metabolic encephalopathy    CHF (congestive heart failure) ([place]) 03/10/2021   Uncontrolled hypertension 03/10/2021   Hypertensive crisis 01/01/2021   ACS (acute coronary syndrome) ([place])    AKI (acute kidney injury) ([place])    Atrial fibrillation ([place]) 03/30/2020   Contusion of right hand 11/12/2018   Primary osteoarthritis of both first carpometacarpal joints 11/12/2018   Pain in joint of right shoulder 10/05/2018   Pain in right hand 10/05/2018   Hematoma 03/30/2018   Degeneration of lumbar intervertebral disc 12/21/2017   Lumbar radiculopathy 12/21/2017   Vertigo 04/09/2017   Hypogonadism in male 12/07/2014   Screening for prostate cancer 12/07/2014   Claudication of right lower extremity ([place]) 10/20/2013   Claudication in peripheral vascular disease- Rt leg 09/20/2013   Obesity (BMI 30-39.9)- negative sleep study in the past 09/20/2013   Occlusion and stenosis of carotid artery without mention of cerebral infarction 05/23/2013   PAD (peripheral artery disease) ([place]) 05/18/2013   Carotid artery disease ([place]) 04/14/2013   Bradycardia, sinus 02/18/2013   Tobacco abuse 02/05/2013   Chest pain, unstable angina, negative MI 02/04/2013   Type 2 diabetes mellitus with stage 3b chronic kidney disease ([place]) 02/04/2013   Coronary artery  disease involving native coronary artery of native heart without angina pectoris 02/04/2013   Benign essential hypertension 07/10/2011   Incisional hernia 05/22/2011    Current Outpatient Medications:    amiodarone (PACERONE) 200 MG tablet, Take 1 tablet (200 mg total) by mouth 2 (two) times daily., Disp: 60 tablet, Rfl: 0   amLODipine (NORVASC) 10 MG tablet, Take 1 tablet (10 mg total) by mouth daily., Disp: 30 tablet, Rfl: 11   apixaban (ELIQUIS) 2.5 MG TABS tablet, Take 1 tablet (2.5 mg  total) by mouth 2 (two) times daily., Disp: 60 tablet, Rfl: 0   carvedilol (COREG) 25 MG tablet, Take 1 tablet (25 mg total) by mouth 2 (two) times daily with a meal., Disp: 60 tablet, Rfl: 0   clopidogrel (PLAVIX) 75 MG tablet, Take 1 tablet (75 mg total) by mouth daily., Disp: 30 tablet, Rfl: 1   dapagliflozin propanediol (FARXIGA) 10 MG TABS tablet, Take 1 tablet (10 mg total) by mouth daily before breakfast., Disp: 30 tablet, Rfl: 0   glipiZIDE (GLUCOTROL) 5 MG tablet, Take 5 mg by mouth daily before breakfast., Disp: , Rfl:    hydrALAZINE (APRESOLINE) 100 MG tablet, Take 1 tablet (100 mg total) by mouth 3 (three) times daily., Disp: 90 tablet, Rfl: 0   isosorbide mononitrate (IMDUR) 120 MG 24 hr tablet, Take 1 tablet (120 mg total) by mouth daily., Disp: 30 tablet, Rfl: 0   levothyroxine (SYNTHROID) 25 MCG tablet, TAKE ONE TABLET BY MOUTH DAILY BEFORE BREAKFAST (Patient taking differently: Take 25 mcg by mouth daily before breakfast.), Disp: 90 tablet, Rfl: 3   pantoprazole (PROTONIX) 40 MG tablet, Take 40 mg by mouth every other day., Disp: , Rfl:    spironolactone (ALDACTONE) 25 MG tablet, Take 1 tablet (25 mg total) by mouth daily., Disp: 30 tablet, Rfl: 0   atorvastatin (LIPITOR) 40 MG tablet, Take 1 tablet (40 mg total) by mouth daily., Disp: 30 tablet, Rfl: 0   torsemide (DEMADEX) 20 MG tablet, Take 2 tablets (40 mg total) by mouth daily., Disp: 120 tablet, Rfl: 4 Allergies  Allergen Reactions   Fentanyl Other (See Comments)    Behavioral changes   Gabapentin Swelling   Lisinopril Other (See Comments)    unknown   Lyrica [Pregabalin] Other (See Comments)    unknown   Metformin Diarrhea   Propofol Other (See Comments)    Heart rate dropped      Social History   Socioeconomic History   Marital status: Married    Spouse name: Masahiro Iglesia   Number of children: 1   Years of education: Not on file   Highest education level: High school graduate  Occupational History    Occupation: retired  Tobacco Use   Smoking status: Former    Packs/day: 1.50    Years: 57.00    Total pack years: 85.50    Types: Cigarettes, E-cigarettes    Quit date: 2020    Years since quitting: 3.8   Smokeless tobacco: Never   Tobacco comments:    pt states that he is using the vapor cigs--12/2021  Vaping Use   Vaping Use: Former   Start date: 09/15/2016   Substances: Nicotine  Substance and Sexual Activity   Alcohol use: Yes    Alcohol/week: 1.0 standard drink of alcohol    Types: 1 Glasses of wine per week    Comment: "very little"   Drug use: No   Sexual activity: Not on file  Other Topics Concern   Not on  file  Social History Narrative   Not on file   Social Determinants of Health   Financial Resource Strain: Low Risk  (06/03/2022)   Overall Financial Resource Strain (CARDIA)    Difficulty of Paying Living Expenses: Not hard at all  Food Insecurity: No Food Insecurity (07/02/2022)   Hunger Vital Sign    Worried About Running Out of Food in the Last Year: Never true    Ran Out of Food in the Last Year: Never true  Transportation Needs: No Transportation Needs (07/02/2022)   PRAPARE - Administrator, Civil Service (Medical): No    Lack of Transportation (Non-Medical): No  Physical Activity: Not on file  Stress: Not on file  Social Connections: Not on file  Intimate Partner Violence: Not At Risk (07/02/2022)   Humiliation, Afraid, Rape, and Kick questionnaire    Fear of Current or Ex-Partner: No    Emotionally Abused: No    Physically Abused: No    Sexually Abused: No    Physical Exam      Future Appointments  Date Time Provider Department Center  08/04/2022 10:30 AM [facility] PA/NP [facility] None

## 2022-07-22 NOTE — Telephone Encounter (Signed)
Spoke with Albert Powell from paramedicine. She is raising concerns about Albert Powell receiving his medications.  She stated that his wife is meant to be assisting with administration of medication, but she is unaware that he has afternoon and evening medications to take although they are in the pill box that Albert Powell fills every week for him. Albert Powell feels its becoming a safety issue as Albert Powell is coming across as forgetful and asking the same question over again. Advised Albert Powell to raise issue with their family as they may need further assistance going forward.

## 2022-07-24 DIAGNOSIS — I130 Hypertensive heart and chronic kidney disease with heart failure and stage 1 through stage 4 chronic kidney disease, or unspecified chronic kidney disease: Secondary | ICD-10-CM | POA: Diagnosis not present

## 2022-07-24 DIAGNOSIS — N1832 Chronic kidney disease, stage 3b: Secondary | ICD-10-CM | POA: Diagnosis not present

## 2022-07-24 DIAGNOSIS — E1122 Type 2 diabetes mellitus with diabetic chronic kidney disease: Secondary | ICD-10-CM | POA: Diagnosis not present

## 2022-07-24 DIAGNOSIS — I082 Rheumatic disorders of both aortic and tricuspid valves: Secondary | ICD-10-CM | POA: Diagnosis not present

## 2022-07-24 DIAGNOSIS — I4819 Other persistent atrial fibrillation: Secondary | ICD-10-CM | POA: Diagnosis not present

## 2022-07-24 DIAGNOSIS — I5043 Acute on chronic combined systolic (congestive) and diastolic (congestive) heart failure: Secondary | ICD-10-CM | POA: Diagnosis not present

## 2022-07-28 DIAGNOSIS — N1832 Chronic kidney disease, stage 3b: Secondary | ICD-10-CM | POA: Diagnosis not present

## 2022-07-28 DIAGNOSIS — E1122 Type 2 diabetes mellitus with diabetic chronic kidney disease: Secondary | ICD-10-CM | POA: Diagnosis not present

## 2022-07-28 DIAGNOSIS — I082 Rheumatic disorders of both aortic and tricuspid valves: Secondary | ICD-10-CM | POA: Diagnosis not present

## 2022-07-28 DIAGNOSIS — I4819 Other persistent atrial fibrillation: Secondary | ICD-10-CM | POA: Diagnosis not present

## 2022-07-28 DIAGNOSIS — I130 Hypertensive heart and chronic kidney disease with heart failure and stage 1 through stage 4 chronic kidney disease, or unspecified chronic kidney disease: Secondary | ICD-10-CM | POA: Diagnosis not present

## 2022-07-28 DIAGNOSIS — I5043 Acute on chronic combined systolic (congestive) and diastolic (congestive) heart failure: Secondary | ICD-10-CM | POA: Diagnosis not present

## 2022-07-29 DIAGNOSIS — I130 Hypertensive heart and chronic kidney disease with heart failure and stage 1 through stage 4 chronic kidney disease, or unspecified chronic kidney disease: Secondary | ICD-10-CM | POA: Diagnosis not present

## 2022-07-29 DIAGNOSIS — I5043 Acute on chronic combined systolic (congestive) and diastolic (congestive) heart failure: Secondary | ICD-10-CM | POA: Diagnosis not present

## 2022-07-29 DIAGNOSIS — N1832 Chronic kidney disease, stage 3b: Secondary | ICD-10-CM | POA: Diagnosis not present

## 2022-07-29 DIAGNOSIS — I4819 Other persistent atrial fibrillation: Secondary | ICD-10-CM | POA: Diagnosis not present

## 2022-07-29 DIAGNOSIS — E1122 Type 2 diabetes mellitus with diabetic chronic kidney disease: Secondary | ICD-10-CM | POA: Diagnosis not present

## 2022-07-29 DIAGNOSIS — I082 Rheumatic disorders of both aortic and tricuspid valves: Secondary | ICD-10-CM | POA: Diagnosis not present

## 2022-07-30 ENCOUNTER — Other Ambulatory Visit (HOSPITAL_COMMUNITY): Payer: Self-pay | Admitting: Emergency Medicine

## 2022-07-30 ENCOUNTER — Telehealth (HOSPITAL_COMMUNITY): Payer: Self-pay | Admitting: Emergency Medicine

## 2022-07-30 ENCOUNTER — Other Ambulatory Visit (HOSPITAL_COMMUNITY): Payer: Self-pay | Admitting: *Deleted

## 2022-07-30 MED ORDER — TORSEMIDE 20 MG PO TABS: 40.0000 mg | ORAL_TABLET | Freq: Every day | ORAL | 4 refills | Status: DC

## 2022-07-30 MED ORDER — AMIODARONE HCL 200 MG PO TABS: 200.0000 mg | ORAL_TABLET | Freq: Two times a day (BID) | ORAL | 3 refills | Status: DC

## 2022-07-30 MED ORDER — PANTOPRAZOLE SODIUM 40 MG PO TBEC: 40.0000 mg | DELAYED_RELEASE_TABLET | ORAL | 3 refills | Status: DC

## 2022-07-30 MED ORDER — ISOSORBIDE MONONITRATE ER 120 MG PO TB24: 120.0000 mg | ORAL_TABLET | Freq: Every day | ORAL | 3 refills | Status: DC

## 2022-07-30 MED ORDER — CARVEDILOL 25 MG PO TABS: 25.0000 mg | ORAL_TABLET | Freq: Two times a day (BID) | ORAL | 3 refills | Status: DC

## 2022-07-30 MED ORDER — SPIRONOLACTONE 25 MG PO TABS: 25.0000 mg | ORAL_TABLET | Freq: Every day | ORAL | 3 refills | Status: DC

## 2022-07-30 MED ORDER — APIXABAN 2.5 MG PO TABS: 2.5000 mg | ORAL_TABLET | Freq: Two times a day (BID) | ORAL | 3 refills | Status: DC

## 2022-07-30 NOTE — Progress Notes (Signed)
Paramedicine Encounter    Patient ID: Albert Powell, male    DOB: Jan 03, 1941, 81 y.o.   MRN: [id number]   BP (!) 220/120 (BP Location: Left Arm, Patient Position: Sitting, Cuff Size: Normal)   Pulse 70   SpO2 98%  Weight yesterday-not taken Last visit weight-201lb  ATF Albert Powell A&O x 4, skin W&D w/ good color.  He has done well to with his med compliance missing only 2 noon doses for the week.  Pt denies chest pain or SOB.  Lung sounds clear and equal bilat.  No edema noted.  Meds reconciled x 1 week.  Box missing Cleda Daub & Torsemide for Mon/Tues.   Multiple meds from TOC needed refills.  Sent message to clinic triage to refill: Spironolactone, Torsemide, Pantoprazole, Eliquis, Amiodarone, Amlodopine Atorvastatin and Isosorbide.   Cardiomems reading taken and transmitted.  Reached out to to HF Triage regarding blood pressure and advised no med changes at this time and to have pt his take his morning meds.  Reminded Albert Powell of his appointment at the [facility] Clinic 11/20 @ 10:30 and he advised he would be there.   Home visit complete.    Beatrix Shipper, EMT-Paramedic [phone] 07/30/2022  Patient Care Team: Patient, No Pcp Per as PCP - General (General Practice) [name], MD as PCP - Cardiology (Cardiology) [name], MD as Consulting Physician (Cardiology)  Patient Active Problem List   Diagnosis Date Noted   Hypokalemia 06/23/2022   Elevated troponin 06/23/2022   Type 2 diabetes mellitus with hyperlipidemia ([name]) 05/19/2022   Acute combined systolic and diastolic heart failure ([name])    Acute kidney injury superimposed on chronic kidney disease ([name]) 05/13/2022   GERD (gastroesophageal reflux disease) 05/13/2022   Acquired hypothyroidism 05/13/2022   Acute on chronic systolic CHF (congestive heart failure) ([name]) 11/23/2021   PAF (paroxysmal atrial fibrillation) ([name]) 10/08/2021   Acute on chronic diastolic heart failure ([name]) 06/07/2021   Acute metabolic  encephalopathy    CHF (congestive heart failure) ([name]) 03/10/2021   Uncontrolled hypertension 03/10/2021   Hypertensive crisis 01/01/2021   ACS (acute coronary syndrome) ([name])    AKI (acute kidney injury) ([name])    Atrial fibrillation ([name]) 03/30/2020   Contusion of right hand 11/12/2018   Primary osteoarthritis of both first carpometacarpal joints 11/12/2018   Pain in joint of right shoulder 10/05/2018   Pain in right hand 10/05/2018   Hematoma 03/30/2018   Degeneration of lumbar intervertebral disc 12/21/2017   Lumbar radiculopathy 12/21/2017   Vertigo 04/09/2017   Hypogonadism in male 12/07/2014   Screening for prostate cancer 12/07/2014   Claudication of right lower extremity ([name]) 10/20/2013   Claudication in peripheral vascular disease- Rt leg 09/20/2013   Obesity (BMI 30-39.9)- negative sleep study in the past 09/20/2013   Occlusion and stenosis of carotid artery without mention of cerebral infarction 05/23/2013   PAD (peripheral artery disease) ([name]) 05/18/2013   Carotid artery disease ([name]) 04/14/2013   Bradycardia, sinus 02/18/2013   Tobacco abuse 02/05/2013   Chest pain, unstable angina, negative MI 02/04/2013   Type 2 diabetes mellitus with stage 3b chronic kidney disease ([name]) 02/04/2013   Coronary artery disease involving native coronary artery of native heart without angina pectoris 02/04/2013   Benign essential hypertension 07/10/2011   Incisional hernia 05/22/2011    Current Outpatient Medications:    amiodarone (PACERONE) 200 MG tablet, Take 1 tablet (200 mg total) by mouth 2 (two) times daily., Disp: 60 tablet, Rfl: 0   amLODipine (  NORVASC) 10 MG tablet, Take 1 tablet (10 mg total) by mouth daily., Disp: 30 tablet, Rfl: 11   apixaban (ELIQUIS) 2.5 MG TABS tablet, Take 1 tablet (2.5 mg total) by mouth 2 (two) times daily., Disp: 60 tablet, Rfl: 0   carvedilol (COREG) 25 MG tablet, Take 1 tablet (25 mg total) by mouth 2 (two) times daily with a meal., Disp: 60  tablet, Rfl: 0   clopidogrel (PLAVIX) 75 MG tablet, Take 1 tablet (75 mg total) by mouth daily., Disp: 30 tablet, Rfl: 1   dapagliflozin propanediol (FARXIGA) 10 MG TABS tablet, Take 1 tablet (10 mg total) by mouth daily before breakfast., Disp: 30 tablet, Rfl: 0   glipiZIDE (GLUCOTROL) 5 MG tablet, Take 5 mg by mouth daily before breakfast., Disp: , Rfl:    hydrALAZINE (APRESOLINE) 100 MG tablet, Take 1 tablet (100 mg total) by mouth 3 (three) times daily., Disp: 90 tablet, Rfl: 0   isosorbide mononitrate (IMDUR) 120 MG 24 hr tablet, Take 1 tablet (120 mg total) by mouth daily., Disp: 30 tablet, Rfl: 0   levothyroxine (SYNTHROID) 25 MCG tablet, TAKE ONE TABLET BY MOUTH DAILY BEFORE BREAKFAST (Patient taking differently: Take 25 mcg by mouth daily before breakfast.), Disp: 90 tablet, Rfl: 3   spironolactone (ALDACTONE) 25 MG tablet, Take 1 tablet (25 mg total) by mouth daily., Disp: 30 tablet, Rfl: 0   torsemide (DEMADEX) 20 MG tablet, Take 2 tablets (40 mg total) by mouth daily., Disp: 120 tablet, Rfl: 4   atorvastatin (LIPITOR) 40 MG tablet, Take 1 tablet (40 mg total) by mouth daily., Disp: 30 tablet, Rfl: 0   pantoprazole (PROTONIX) 40 MG tablet, Take 40 mg by mouth every other day., Disp: , Rfl:  Allergies  Allergen Reactions   Fentanyl Other (See Comments)    Behavioral changes   Gabapentin Swelling   Lisinopril Other (See Comments)    unknown   Lyrica [Pregabalin] Other (See Comments)    unknown   Metformin Diarrhea   Propofol Other (See Comments)    Heart rate dropped      Social History   Socioeconomic History   Marital status: Married    Spouse name: Mechel Posen   Number of children: 1   Years of education: Not on file   Highest education level: High school graduate  Occupational History   Occupation: retired  Tobacco Use   Smoking status: Former    Packs/day: 1.50    Years: 57.00    Total pack years: 85.50    Types: Cigarettes, E-cigarettes    Quit date:  2020    Years since quitting: 3.8   Smokeless tobacco: Never   Tobacco comments:    pt states that he is using the vapor cigs--12/2021  Vaping Use   Vaping Use: Former   Start date: 09/15/2016   Substances: Nicotine  Substance and Sexual Activity   Alcohol use: Yes    Alcohol/week: 1.0 standard drink of alcohol    Types: 1 Glasses of wine per week    Comment: "very little"   Drug use: No   Sexual activity: Not on file  Other Topics Concern   Not on file  Social History Narrative   Not on file   Social Determinants of Health   Financial Resource Strain: Low Risk  (06/03/2022)   Overall Financial Resource Strain (CARDIA)    Difficulty of Paying Living Expenses: Not hard at all  Food Insecurity: No Food Insecurity (07/02/2022)   Hunger Vital Sign  Worried About Charity fundraiser in the Last Year: Never true    [place] in the Last Year: Never true  Transportation Needs: No Transportation Needs (07/02/2022)   PRAPARE - Hydrologist (Medical): No    Lack of Transportation (Non-Medical): No  Physical Activity: Not on file  Stress: Not on file  Social Connections: Not on file  Intimate Partner Violence: Not At Risk (07/02/2022)   Humiliation, Afraid, Rape, and Kick questionnaire    Fear of Current or Ex-Partner: No    Emotionally Abused: No    Physically Abused: No    Sexually Abused: No    Physical Exam      Future Appointments  Date Time Provider [place]  08/04/2022 10:30 AM [facility] PA/NP [facility] None       [name], [place] Paramedic  07/30/22

## 2022-07-30 NOTE — Telephone Encounter (Signed)
Called home and mobile numbers to contact Mr and/or Mrs Waterhouse.  No answer on either number.  Left voicemail for them to call back and at least let me know they're ok.    Beatrix Shipper, EMT-Paramedic [phone] 07/30/2022

## 2022-07-30 NOTE — Telephone Encounter (Signed)
Called Thomasene Lot to try and see if she knew if her parents should be home.   LVM for her to call me back.    Beatrix Shipper, EMT-Paramedic [phone] 07/30/2022

## 2022-07-30 NOTE — Progress Notes (Signed)
Visited residence this morning for scheduled home visit.  No answer at the door and no answer on home or mobile number.   No pt. Contact.    Beatrix Shipper, EMT-Paramedic [phone] 07/30/2022

## 2022-07-31 ENCOUNTER — Other Ambulatory Visit (HOSPITAL_COMMUNITY): Payer: Self-pay

## 2022-07-31 MED ORDER — ATORVASTATIN CALCIUM 40 MG PO TABS: 40.0000 mg | ORAL_TABLET | Freq: Every day | ORAL | 0 refills | Status: DC

## 2022-08-01 ENCOUNTER — Telehealth (HOSPITAL_COMMUNITY): Payer: Self-pay

## 2022-08-01 NOTE — Telephone Encounter (Signed)
Called to confirm/remind patient of their appointment at the [facility] on 08/04/22.   Patient reminded to bring all medications and/or complete list.  Confirmed patient has transportation. Gave directions, instructed to utilize valet parking.  Confirmed appointment prior to ending call.

## 2022-08-04 ENCOUNTER — Inpatient Hospital Stay (HOSPITAL_COMMUNITY)
Admission: RE | Admit: 2022-08-04 | Discharge: 2022-08-04 | Disposition: A | Discharge status: Home / Self Care | Payer: Medicare Other | Source: Ambulatory Visit

## 2022-08-04 NOTE — Progress Notes (Incomplete)
ADVANCED HF CLINIC CONSULT NOTE  Referring Physician: Primary Care: Primary Cardiologist:  HPI:  Mr Germond  is 81 y.o. male with a history PAF, CAD s/p stenting x2 to RCA in 2003 with residual LAD disease, carotid artery disease s/p carotid artery stenosis s/p LICA endarterectomy 04/2013, peripheral arterial disease, hypertension, hyperlipidemia, T2DM, renal artery stenosis.   2003 required PCI x2 to RCA    Admitted 12/2020 for hypertensive emergency, flash pulmonary edema, A. fib with RVR due to poor medication adherence.  Home meds were restarted, given one dose of IV lasix 40mg  and he returned back to baseline.     Admitted 02/2021 with A/C diastolic heart failure and HTN urgency. Diuresed with IV lasix.    Initially seen in [facility] 03/21/21 and was started on farxiga and lasix was switched to as needed.    Admitted 07/2021 A/C HFpEF and hypertension. BNP >4500. Diuresed with IV lasix and transitioned to lasix 20 mg po daily. Discharge weigth 218 pounds. Referred to back to [facility] clinic. At [facility] clinic f/us he was noted to be mildly fluid overloaded and diuretics increased but ultimately referred back to [facility]. [facility] for further management.   Unfortunately, he was readmitted 3/11-3/15 for a/c diastolic CHF, in the setting of hypertensive urgency and atrial fibrillation w/ RVR. Placed on IV Lasix and nitro gtt.  Echo showed normal LVEF 50-55%, G1DD, RV normal. No significant valvular dysfunction. Transitioned back to PO Lasix. Afib rate controlled w/ beta blocker. Referred back to [facility] clinic. D/c wt 224 lb. At visit, discussion was held regarding referral to [facility] for RHC and possible CardioMems, but unfortunately pt failed to f/u after.    Since then, he has had multiple readmissions and ED visits for a/c CHF and hypertensive urgencies.    Admitted 8/23 w/ a/c CHF. Echo showed drop in EF, down to 25-30%, RV normal. Noted to be back in Afib w/ RVR when echo was done.    Readmitted again 9/23 for  HF. R/LHC was done and showed mild to moderate obstructive coronary artery disease. CM out of proportion to degree of CAD. RHC showed normal filling pressures and perserved CO, CI 2.7 L/min/m.   Presented back to the ED yesterday w/ complaints of increased SOB x 2 days. BP markedly elevated 200s/130s. BNP 3,165. CXR w/ small b/l pleural effusions. HS trop 64>>66. EKG showed Afib 98 bpm. Scr 1.57 c/w baseline. K 3.0. O2 sats in 80s, improved w/ 2L [place]. Started on IV Lasix and NTG gtt.  Now off NTG after getting hypotensive, SBPs dropped into 70s. Given 250 cc bolus IVF per primary team. BP improved, 110s systolic.        2D Echo 04/2022 left ventricular ejection fraction, by estimation, is 25 to 30%. The left ventricle has severely decreased function. The left ventricle demonstrates regional wall motion abnormalities (see scoring diagram/findings for description). No LVH  1. Right ventricular systolic function is normal. The right ventricular size is normal. Tricuspid regurgitation signal is inadequate for assessing PA pressure. 2. 3. Trivial mitral valve regurgitation. No evidence of mitral stenosis. The aortic valve is tricuspid. There is mild calcification of the aortic valve. There is mild thickening of the aortic valve. Aortic valve sclerosis/calcification is present, without any evidence of aortic stenosis. 4. The inferior vena cava is dilated in size with <50% respiratory variability, suggesting right atrial pressure of 15 mmHg.   [facility] 9/23   Prox LAD to Mid LAD lesion is 40% stenosed.   Prox Cx  to Mid Cx lesion is 30% stenosed.   Prox RCA to Mid RCA lesion is 60% stenosed.   1st Diag lesion is 60% stenosed.   Dist LAD lesion is 40% stenosed.   Non-stenotic Ost RCA to Prox RCA lesion was previously treated.   1.  Mild to moderate obstructive coronary artery disease; compared with the previous study the angiographic appearance looks relatively improved.  Overall the burden of  coronary artery disease does not explain the patient's severe cardiomyopathy.   2.  Mean RA pressure of 5 mmHg and mean wedge pressure of 5 mmHg with normal cardiac output of 5.9 L/min and cardiac index of 2.7 L/min/m.   Recommendation: Goal-directed medical therapy        Review of Systems: [y] = yes,  = no   General: Weight gain ; Weight loss ; Anorexia ; Fatigue ; Fever ; Chills ; Weakness   Cardiac: Chest pain/pressure ; Resting SOB ; Exertional SOB ; Orthopnea ; Pedal Edema ; Palpitations ; Syncope ; Presyncope ; Paroxysmal nocturnal dyspnea[ ]   Pulmonary: Cough ; Wheezing[ ] ; Hemoptysis[ ] ; Sputum ; Snoring   GI: Vomiting[ ] ; Dysphagia[ ] ; Melena[ ] ; Hematochezia ; Heartburn[ ] ; Abdominal pain ; Constipation ; Diarrhea ; BRBPR   GU: Hematuria[ ] ; Dysuria ; Nocturia[ ]   Vascular: Pain in legs with walking ; Pain in feet with lying flat ; Non-healing sores ; Stroke ; TIA ; Slurred speech ;  Neuro: Headaches[ ] ; Vertigo[ ] ; Seizures[ ] ; Paresthesias[ ] ;Blurred vision ; Diplopia ; Vision changes   Ortho/Skin: Arthritis ; Joint pain ; Muscle pain ; Joint swelling ; Back Pain ; Rash   Psych: Depression[ ] ; Anxiety[ ]   Heme: Bleeding problems ; Clotting disorders ; Anemia   Endocrine: Diabetes ; Thyroid dysfunction[ ]    Past Medical History:  Diagnosis Date   Atrial fibrillation ([name]) 03/30/2020   Bradycardia, sinus 02/18/2013   CAD (coronary artery disease)    stent to RCA 2003 also had 40% lesion at that time; NUCLEAR STRESS TEST, 01/16/2010 - normal  now with cath 80-90% stenosis in LAD, will try medical therapy if no improvement PCI   Carotid artery disease ([name])    Cataract    CHF (congestive heart failure) ([name])    Chronic kidney disease (CKD), stage III (moderate) ([name])    Claudication ([name])    LEA DUPLEX, 07/07/2008 - Normal   COPD (chronic  obstructive pulmonary disease) ([name])    DDD (degenerative disc disease) 2008   Diabetes mellitus    GERD (gastroesophageal reflux disease)    H/O hiatal hernia    History of colonoscopy 2004   finding of tics and AMV only no polpys   Hyperlipidemia 02/05/2013   Hypertension    Hypothyroidism    Obesity    Onychomycosis    PAD (peripheral artery disease) ([name])    Rosacea    Tobacco abuse 02/05/2013    Current Outpatient Medications  Medication Sig Dispense Refill   amiodarone (PACERONE) 200 MG tablet Take 1 tablet (200 mg total) by mouth 2 (two) times daily. 60 tablet 3   amLODipine (NORVASC) 10 MG tablet Take 1 tablet (10 mg total) by mouth daily. 30 tablet 11   apixaban (ELIQUIS) 2.5 MG TABS tablet  Take 1 tablet (2.5 mg total) by mouth 2 (two) times daily. 60 tablet 3   atorvastatin (LIPITOR) 40 MG tablet Take 1 tablet (40 mg total) by mouth daily. 30 tablet 0   carvedilol (COREG) 25 MG tablet Take 1 tablet (25 mg total) by mouth 2 (two) times daily with a meal. 60 tablet 3   clopidogrel (PLAVIX) 75 MG tablet Take 1 tablet (75 mg total) by mouth daily. 30 tablet 1   glipiZIDE (GLUCOTROL) 5 MG tablet Take 5 mg by mouth daily before breakfast.     hydrALAZINE (APRESOLINE) 100 MG tablet Take 1 tablet (100 mg total) by mouth 3 (three) times daily. 90 tablet 0   isosorbide mononitrate (IMDUR) 120 MG 24 hr tablet Take 1 tablet (120 mg total) by mouth daily. 30 tablet 3   levothyroxine (SYNTHROID) 25 MCG tablet TAKE ONE TABLET BY MOUTH DAILY BEFORE BREAKFAST (Patient taking differently: Take 25 mcg by mouth daily before breakfast.) 90 tablet 3   pantoprazole (PROTONIX) 40 MG tablet Take 1 tablet (40 mg total) by mouth every other day. 30 tablet 3   spironolactone (ALDACTONE) 25 MG tablet Take 1 tablet (25 mg total) by mouth daily. 30 tablet 3   torsemide (DEMADEX) 20 MG tablet Take 2 tablets (40 mg total) by mouth daily. 120 tablet 4   No current facility-administered medications for this  visit.    Allergies  Allergen Reactions   Fentanyl Other (See Comments)    Behavioral changes   Gabapentin Swelling   Lisinopril Other (See Comments)    unknown   Lyrica [Pregabalin] Other (See Comments)    unknown   Metformin Diarrhea   Propofol Other (See Comments)    Heart rate dropped      Social History   Socioeconomic History   Marital status: Married    Spouse name: Trayquan Kolakowski   Number of children: 1   Years of education: Not on file   Highest education level: High school graduate  Occupational History   Occupation: retired  Tobacco Use   Smoking status: Former    Packs/day: 1.50    Years: 57.00    Total pack years: 85.50    Types: Cigarettes, E-cigarettes    Quit date: 2020    Years since quitting: 3.8   Smokeless tobacco: Never   Tobacco comments:    pt states that he is using the vapor cigs--12/2021  Vaping Use   Vaping Use: Former   Start date: 09/15/2016   Substances: Nicotine  Substance and Sexual Activity   Alcohol use: Yes    Alcohol/week: 1.0 standard drink of alcohol    Types: 1 Glasses of wine per week    Comment: "very little"   Drug use: No   Sexual activity: Not on file  Other Topics Concern   Not on file  Social History Narrative   Not on file   Social Determinants of Health   Financial Resource Strain: Low Risk  (06/03/2022)   Overall Financial Resource Strain (CARDIA)    Difficulty of Paying Living Expenses: Not hard at all  Food Insecurity: No Food Insecurity (07/02/2022)   Hunger Vital Sign    Worried About Running Out of Food in the Last Year: Never true    Ran Out of Food in the Last Year: Never true  Transportation Needs: No Transportation Needs (07/02/2022)   PRAPARE - Administrator, Civil Service (Medical): No    Lack of Transportation (Non-Medical): No  Physical Activity:  Not on file  Stress: Not on file  Social Connections: Not on file  Intimate Partner Violence: Not At Risk (07/02/2022)    Humiliation, Afraid, Rape, and Kick questionnaire    Fear of Current or Ex-Partner: No    Emotionally Abused: No    Physically Abused: No    Sexually Abused: No      Family History  Problem Relation Age of Onset   Heart disease Father        heart attack at 72   Heart attack Father    Hyperlipidemia Father    Colonic polyp Mother        that bled out   COPD Mother    Diabetes Mother    Heart disease Sister    Colonic polyp Sister    Stroke Maternal Grandfather    Heart attack Paternal Grandfather    Other Neg Hx        hypogonadism    There were no vitals filed for this visit.  PHYSICAL EXAM: General:  Well appearing. No respiratory difficulty HEENT: normal Neck: supple. no JVD. Carotids 2+ bilat; no bruits. No lymphadenopathy or thryomegaly appreciated. Cor: PMI nondisplaced. Regular rate & rhythm. No rubs, gallops or murmurs. Lungs: clear Abdomen: soft, nontender, nondistended. No hepatosplenomegaly. No bruits or masses. Good bowel sounds. Extremities: no cyanosis, clubbing, rash, edema Neuro: alert & oriented x 3, cranial nerves grossly intact. moves all 4 extremities w/o difficulty. Affect pleasant.  ECG:   ASSESSMENT & PLAN: 1. Acute on chronic systolic CHF: In setting of atrial fibrillation with mild RVR and initial uncontrolled HTN.  Echo in 8/23 showed EF 25-30% with normal RV, trivial MR, dilated IVC.  This was significantly lower than in the past (EF 50-55% in 3/23).  LHC in 9/23 showed nonobstructive CAD, unable to explain fall in EF. He was in atrial fibrillation in 8/23, ?tachy-mediated CMP. Diuresed w/ IV Lasix on admit, then developed AKI, likely due from development of hypotension after antihypertensive titration + over-diuresis. Lasix held 10/12. On 10/13 developed flash pulmonary edema. S/P Cardiomems  . CVP 9-10. Overall diuresed 9 pounds. Continue torsemide 40 mg daily.  -Continue Coreg 25 mg bid.  - Continue amio to 200 mg twice a day.   - Off  entresto w/ AKI and RAS. - Continue Farxiga 10 mg  - Continue po hydralazine 100 TID + Imdur 120 daily.  - Continue  spironolactone 25 mg daily  - He is agreeable to paramedicine in the community - Reinforced daily Cardiomems readings.  2. Atrial fibrillation: He appears to have been in atrial fibrillation (with RVR at times) since 8/23. In SR today.  - Continue amio 200 mg twice a day.  - apixaban 2.5 mg bid held for Cardiomems - Need to maintain NSR, will consider ablation down the road.  3. HTN/renal artery stenosis: Patient was admitted with hypertensive emergency/CHF.  Patient has occluded left renal artery with atrophic left kidney.  By renal artery dopplers, right renal artery with >60% stenosis.  Reviewed this with Dr Allyson Sabal, however, and he thinks that the degree of stenosis is actually less, more in the 40-50% range.  Will need to follow closely as it is unclear how much renal artery stenosis is contributing to HTN/flash pulmonary edema episodes.   - Will see Dr. Allyson Sabal as outpatient, there will be consideration for CO2 angiography.  - Continue current regimen.   4. CAD: H/o PCI x 2 to RCA.  Cath in 8/23 after EF found to be  low on echo did not explain his cardiomyopathy.   - Continue statin.  - He is on apixaban so do not think he needs to continue Plavix with no recent PCI.  5. PAD: He has severe right SFA and right EIA stenosis by 12/21 peripheral angiography.  Follows with Dr. Allyson Sabal. No rest pain or pedal ulcerations.  6. AKI on CKD stage 3: Creatinine up to 2.4 from 1.57 on admit.  Scr 1.92 => 2 => 2.12 today. This may be due to significant renal artery stenosis +/- transient hypotension after aggressively lowering BP   Ok for d/c  HF meds Torsemide 40 mg daily  Spironolactone 25 mg daily  Imdur 120 mg daily Hydralazine 100 mg tid Farxiga 10 mg daily Carvedilol 25 mg twice a day Amiodarone 200 mg twice a day x 1 week then once daily after that.  Eliquis 2.5 mg twice a day   Atorvastatin 40 mg daily

## 2022-08-05 ENCOUNTER — Telehealth (HOSPITAL_COMMUNITY): Payer: Self-pay | Admitting: Cardiology

## 2022-08-05 ENCOUNTER — Other Ambulatory Visit (HOSPITAL_COMMUNITY): Payer: Self-pay | Admitting: Emergency Medicine

## 2022-08-05 DIAGNOSIS — I2510 Atherosclerotic heart disease of native coronary artery without angina pectoris: Secondary | ICD-10-CM | POA: Diagnosis not present

## 2022-08-05 DIAGNOSIS — J449 Chronic obstructive pulmonary disease, unspecified: Secondary | ICD-10-CM | POA: Diagnosis not present

## 2022-08-05 DIAGNOSIS — I130 Hypertensive heart and chronic kidney disease with heart failure and stage 1 through stage 4 chronic kidney disease, or unspecified chronic kidney disease: Secondary | ICD-10-CM | POA: Diagnosis not present

## 2022-08-05 DIAGNOSIS — I701 Atherosclerosis of renal artery: Secondary | ICD-10-CM | POA: Diagnosis not present

## 2022-08-05 DIAGNOSIS — Z7901 Long term (current) use of anticoagulants: Secondary | ICD-10-CM | POA: Diagnosis not present

## 2022-08-05 DIAGNOSIS — Z7984 Long term (current) use of oral hypoglycemic drugs: Secondary | ICD-10-CM | POA: Diagnosis not present

## 2022-08-05 DIAGNOSIS — Z7902 Long term (current) use of antithrombotics/antiplatelets: Secondary | ICD-10-CM | POA: Diagnosis not present

## 2022-08-05 DIAGNOSIS — M519 Unspecified thoracic, thoracolumbar and lumbosacral intervertebral disc disorder: Secondary | ICD-10-CM | POA: Diagnosis not present

## 2022-08-05 DIAGNOSIS — E1136 Type 2 diabetes mellitus with diabetic cataract: Secondary | ICD-10-CM | POA: Diagnosis not present

## 2022-08-05 DIAGNOSIS — I5043 Acute on chronic combined systolic (congestive) and diastolic (congestive) heart failure: Secondary | ICD-10-CM | POA: Diagnosis not present

## 2022-08-05 DIAGNOSIS — E039 Hypothyroidism, unspecified: Secondary | ICD-10-CM | POA: Diagnosis not present

## 2022-08-05 DIAGNOSIS — I4819 Other persistent atrial fibrillation: Secondary | ICD-10-CM | POA: Diagnosis not present

## 2022-08-05 DIAGNOSIS — E785 Hyperlipidemia, unspecified: Secondary | ICD-10-CM | POA: Diagnosis not present

## 2022-08-05 DIAGNOSIS — I082 Rheumatic disorders of both aortic and tricuspid valves: Secondary | ICD-10-CM | POA: Diagnosis not present

## 2022-08-05 DIAGNOSIS — E1122 Type 2 diabetes mellitus with diabetic chronic kidney disease: Secondary | ICD-10-CM | POA: Diagnosis not present

## 2022-08-05 DIAGNOSIS — Z87891 Personal history of nicotine dependence: Secondary | ICD-10-CM | POA: Diagnosis not present

## 2022-08-05 DIAGNOSIS — E1151 Type 2 diabetes mellitus with diabetic peripheral angiopathy without gangrene: Secondary | ICD-10-CM | POA: Diagnosis not present

## 2022-08-05 DIAGNOSIS — N1832 Chronic kidney disease, stage 3b: Secondary | ICD-10-CM | POA: Diagnosis not present

## 2022-08-05 DIAGNOSIS — K219 Gastro-esophageal reflux disease without esophagitis: Secondary | ICD-10-CM | POA: Diagnosis not present

## 2022-08-05 DIAGNOSIS — E1169 Type 2 diabetes mellitus with other specified complication: Secondary | ICD-10-CM | POA: Diagnosis not present

## 2022-08-05 DIAGNOSIS — Z91148 Patient's other noncompliance with medication regimen for other reason: Secondary | ICD-10-CM | POA: Diagnosis not present

## 2022-08-05 DIAGNOSIS — Z9181 History of falling: Secondary | ICD-10-CM | POA: Diagnosis not present

## 2022-08-05 MED ORDER — CARVEDILOL 25 MG PO TABS: 25.0000 mg | ORAL_TABLET | Freq: Two times a day (BID) | ORAL | 3 refills | Status: DC

## 2022-08-05 MED ORDER — ATORVASTATIN CALCIUM 40 MG PO TABS: 40.0000 mg | ORAL_TABLET | Freq: Every day | ORAL | 0 refills | Status: DC

## 2022-08-05 MED ORDER — HYDRALAZINE HCL 100 MG PO TABS: 100.0000 mg | ORAL_TABLET | Freq: Three times a day (TID) | ORAL | 0 refills | Status: DC

## 2022-08-05 MED ORDER — CLOPIDOGREL BISULFATE 75 MG PO TABS: 75.0000 mg | ORAL_TABLET | Freq: Every day | ORAL | 1 refills | Status: DC

## 2022-08-05 MED ORDER — SPIRONOLACTONE 25 MG PO TABS: 25.0000 mg | ORAL_TABLET | Freq: Every day | ORAL | 3 refills | Status: DC

## 2022-08-05 MED ORDER — ISOSORBIDE MONONITRATE ER 120 MG PO TB24: 120.0000 mg | ORAL_TABLET | Freq: Every day | ORAL | 3 refills | Status: DC

## 2022-08-05 MED ORDER — AMLODIPINE BESYLATE 10 MG PO TABS: 10.0000 mg | ORAL_TABLET | Freq: Every day | ORAL | 11 refills | Status: DC

## 2022-08-05 MED ORDER — APIXABAN 2.5 MG PO TABS: 2.5000 mg | ORAL_TABLET | Freq: Two times a day (BID) | ORAL | 3 refills | Status: DC

## 2022-08-05 MED ORDER — AMIODARONE HCL 200 MG PO TABS: 200.0000 mg | ORAL_TABLET | Freq: Two times a day (BID) | ORAL | 3 refills | Status: DC

## 2022-08-05 MED ORDER — TORSEMIDE 20 MG PO TABS: 40.0000 mg | ORAL_TABLET | Freq: Every day | ORAL | 4 refills | Status: DC
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
## 2022-08-05 NOTE — Progress Notes (Addendum)
Paramedicine Encounter    Patient ID: Albert Powell, male    DOB: 1941/03/11, 81 y.o.   MRN: [id number]   BP (!) 210/100 (BP Location: Right Arm, Patient Position: Sitting, Cuff Size: Normal)   Pulse 76   Resp 16   SpO2 98%  Weight yesterday-not taken Last visit weight-203lb  Today's visit was not scheduled.  I made visit after I was reached by pt's daughter Presley Raddle.  Albert Powell states that she was concerned about her mom and dad as she has been unable to reach them by phone.  She was telling me that her mother stated that her dad left yesterday to drive himself to his clinic appointment which was scheduled @ 10:30.  Albert Powell told her daughter that Albert Powell wasn't home but due to Mrs. Gallaway's mental state Albert Powell didn't believe her mother when she told her he was not home.  I initiated a Welfare check with [employer] and [employer].  While enroute to the residence, I was advised that another family member was able to reach Mr and Mrs. Orihuela by phone so I cancelled [employer] and [employer] response.  On my arrival I found Albert Powell A&O x 4, skin W&D w/ good color.  He was fully clothed lying on the bed on top of the covers in LLR position.  He proceeds to tell me he had the worst day of his life.  With several holes in his story, it appears that he did attempt to drive himself to his clinic appointment.  He does not have a valid drivers license.  He states he got lost and "the police brought me home about 2:00 this morning."  He says he is not sure where he left his car.  He thinks it was left at a [employer] somewhere in [place].   Upon further questioning Albert Powell advised he fell at some point and hit his head.  He had a small abrasion above his right eye.  No active bleeding.  No obvious signs of deformity or trama noted.  He denies LOC.  I encouraged him to seek further medical evaluation at the hospital and he adamantly refused.  I initiated an EMS ticket to document my evaluation,  recommendation and that he refused transport AMA.    I did clear c-spine and he presented with no other deficits.  He was hypertensive and has not taken his medications.  He actually left his medicines in his car but now does not know where his car is.  I delegated Albert Powell to continue search to locate the missing vehicle and as of this evening at around 5:30 she had no new information.  I have the ball rolling with [employer] again in attempt to locate the missing car.  In the meantime will reach out to [facility] so see if we can get an emergency refill on his medications since they are missing.  Cardio mems reading taken today. Home visit complete.  To be continued tomorrow.    [name], [place] 08/11/2022  Patient Care Team: Patient, No Pcp Per as PCP - General ([place]) Lorretta Harp, MD as PCP - Cardiology (Cardiology) Lorretta Harp, MD as Consulting Physician (Cardiology)  Patient Active Problem List   Diagnosis Date Noted   Hypokalemia 06/23/2022   Elevated troponin 06/23/2022   Type 2 diabetes mellitus with hyperlipidemia ([place]) 05/19/2022   Acute combined systolic and diastolic heart failure ([place])    Acute kidney injury  superimposed on chronic kidney disease ([place]) 05/13/2022   GERD (gastroesophageal reflux disease) 05/13/2022   Acquired hypothyroidism 05/13/2022   Acute on chronic systolic CHF (congestive heart failure) ([place]) 11/23/2021   PAF (paroxysmal atrial fibrillation) ([place]) 10/08/2021   Acute on chronic diastolic heart failure ([place]) [id number]   Acute metabolic encephalopathy    CHF (congestive heart failure) ([place]) 03/10/2021   Uncontrolled hypertension 03/10/2021   Hypertensive crisis 01/01/2021   ACS (acute coronary syndrome) ([name])    AKI (acute kidney injury) ([place])    Atrial fibrillation ([place]) 03/30/2020   Contusion of right hand 11/12/2018   Primary osteoarthritis of both first carpometacarpal joints 11/12/2018   Pain in joint  of right shoulder 10/05/2018   Pain in right hand 10/05/2018   Hematoma 03/30/2018   Degeneration of lumbar intervertebral disc 12/21/2017   Lumbar radiculopathy 12/21/2017   Vertigo 04/09/2017   Hypogonadism in male 12/07/2014   Screening for prostate cancer 12/07/2014   Claudication of right lower extremity ([place]) 10/20/2013   Claudication in peripheral vascular disease- Rt leg 09/20/2013   Obesity (BMI 30-39.9)- negative sleep study in the past 09/20/2013   Occlusion and stenosis of carotid artery without mention of cerebral infarction 05/23/2013   PAD (peripheral artery disease) ([place]) 05/18/2013   Carotid artery disease ([place]) 04/14/2013   Bradycardia, sinus 02/18/2013   Tobacco abuse 02/05/2013   Chest pain, unstable angina, negative MI 02/04/2013   Type 2 diabetes mellitus with stage 3b chronic kidney disease ([place]) 02/04/2013   Coronary artery disease involving native coronary artery of native heart without angina pectoris 02/04/2013   Benign essential hypertension 07/10/2011   Incisional hernia 05/22/2011    Current Outpatient Medications:    amiodarone (PACERONE) 200 MG tablet, Take 1 tablet (200 mg total) by mouth 2 (two) times daily., Disp: 60 tablet, Rfl: 3   amLODipine (NORVASC) 10 MG tablet, Take 1 tablet (10 mg total) by mouth daily., Disp: 30 tablet, Rfl: 11   apixaban (ELIQUIS) 2.5 MG TABS tablet, Take 1 tablet (2.5 mg total) by mouth 2 (two) times daily., Disp: 60 tablet, Rfl: 3   atorvastatin (LIPITOR) 40 MG tablet, Take 1 tablet (40 mg total) by mouth daily. (Patient not taking: Reported on 08/06/2022), Disp: 30 tablet, Rfl: 0   carvedilol (COREG) 25 MG tablet, Take 1 tablet (25 mg total) by mouth 2 (two) times daily with a meal., Disp: 60 tablet, Rfl: 3   clopidogrel (PLAVIX) 75 MG tablet, Take 1 tablet (75 mg total) by mouth daily., Disp: 30 tablet, Rfl: 1   glipiZIDE (GLUCOTROL) 5 MG tablet, Take 5 mg by mouth daily before breakfast. (Patient not taking: Reported on  08/06/2022), Disp: , Rfl:    hydrALAZINE (APRESOLINE) 100 MG tablet, Take 1 tablet (100 mg total) by mouth 3 (three) times daily., Disp: 90 tablet, Rfl: 0   isosorbide mononitrate (IMDUR) 120 MG 24 hr tablet, Take 1 tablet (120 mg total) by mouth daily., Disp: 30 tablet, Rfl: 3   levothyroxine (SYNTHROID) 25 MCG tablet, TAKE ONE TABLET BY MOUTH DAILY BEFORE BREAKFAST (Patient taking differently: Take 25 mcg by mouth daily before breakfast.), Disp: 90 tablet, Rfl: 3   pantoprazole (PROTONIX) 40 MG tablet, Take 1 tablet (40 mg total) by mouth every other day., Disp: 30 tablet, Rfl: 3   spironolactone (ALDACTONE) 25 MG tablet, Take 1 tablet (25 mg total) by mouth daily., Disp: 30 tablet, Rfl: 3   torsemide (DEMADEX) 20 MG tablet, Take 2 tablets (40 mg total) by mouth daily.,  Disp: 120 tablet, Rfl: 4 Allergies  Allergen Reactions   Fentanyl Other (See Comments)    Behavioral changes   Gabapentin Swelling   Lisinopril Other (See Comments)    unknown   Lyrica [Pregabalin] Other (See Comments)    unknown   Metformin Diarrhea   Propofol Other (See Comments)    Heart rate dropped      Social History   Socioeconomic History   Marital status: Married    Spouse name: Albert Powell   Number of children: 1   Years of education: Not on file   Highest education level: High school graduate  Occupational History   Occupation: retired  Tobacco Use   Smoking status: Former    Packs/day: 1.50    Years: 57.00    Total pack years: 85.50    Types: Cigarettes, E-cigarettes    Quit date: 2020    Years since quitting: 3.9   Smokeless tobacco: Never   Tobacco comments:    pt states that he is using the vapor cigs--12/2021  Vaping Use   Vaping Use: Former   Start date: 09/15/2016   Substances: Nicotine  Substance and Sexual Activity   Alcohol use: Yes    Alcohol/week: 1.0 standard drink of alcohol    Types: 1 Glasses of wine per week    Comment: "very little"   Drug use: No   Sexual  activity: Not on file  Other Topics Concern   Not on file  Social History Narrative   Not on file   Social Determinants of Health   Financial Resource Strain: Low Risk  (06/03/2022)   Overall Financial Resource Strain (CARDIA)    Difficulty of Paying Living Expenses: Not hard at all  Food Insecurity: No Food Insecurity (07/02/2022)   Hunger Vital Sign    Worried About Running Out of Food in the Last Year: Never true    Ran Out of Food in the Last Year: Never true  Transportation Needs: No Transportation Needs (07/02/2022)   PRAPARE - Administrator, Civil Service (Medical): No    Lack of Transportation (Non-Medical): No  Physical Activity: Not on file  Stress: Not on file  Social Connections: Not on file  Intimate Partner Violence: Not At Risk (07/02/2022)   Humiliation, Afraid, Rape, and Kick questionnaire    Fear of Current or Ex-Partner: No    Emotionally Abused: No    Physically Abused: No    Sexually Abused: No    Physical Exam      No future appointments.     Beatrix Shipper, EMT-P-Paramedic [phone] [facility] Paramedic  08/11/22 2

## 2022-08-06 ENCOUNTER — Telehealth (HOSPITAL_COMMUNITY): Payer: Self-pay | Admitting: Emergency Medicine

## 2022-08-06 ENCOUNTER — Other Ambulatory Visit (HOSPITAL_COMMUNITY): Payer: Self-pay | Admitting: Emergency Medicine

## 2022-08-06 ENCOUNTER — Telehealth (HOSPITAL_COMMUNITY): Payer: Self-pay | Admitting: Licensed Clinical Social Worker

## 2022-08-06 ENCOUNTER — Other Ambulatory Visit (HOSPITAL_COMMUNITY): Payer: Self-pay | Admitting: Cardiology

## 2022-08-06 DIAGNOSIS — I5032 Chronic diastolic (congestive) heart failure: Secondary | ICD-10-CM

## 2022-08-06 NOTE — Telephone Encounter (Signed)
Called [facility] this morning and spoke with Olegario Messier.  Advised her of situation with Mr. Klug not having any medicines due to the recent events of getting lost and losing his car and medications.  She advised she would get back with me later this morning with what the plans are for med replacement.    Beatrix Shipper, EMT-Paramedic [phone] 08/06/2022

## 2022-08-06 NOTE — Telephone Encounter (Signed)
Paramedic expressed concerns with home safety for pt due to getting lost on way to appt and being lost for 16hours requiring to be brought home by police.  Pt lives with wife who appears to have advanced dementia. Paramedic with concerns about home safety and ability for pt and wife to safely care for themselves at home given memory deficits.  They have a dtr but when paramedic had discussed with them there is not a safe care plan in place there is no action steps being taken.  CSW placed consult to [facility] care management for home concerns so they can evaluate further and assist as able.  Burna Sis, LCSW Clinical Social Worker [facility] Desk#: [phone] Cell#: [phone]

## 2022-08-08 ENCOUNTER — Other Ambulatory Visit (HOSPITAL_COMMUNITY): Payer: Self-pay | Admitting: Cardiology

## 2022-08-11 ENCOUNTER — Telehealth: Payer: Self-pay | Admitting: *Deleted

## 2022-08-11 NOTE — Progress Notes (Signed)
Albert Powell lost his car sometime Monday night after getting lost when he attempted to drive himself to his HF clinic appointment Monday morning.  His meds were in his car.  I was able to reach out to [facility] and get emergency refill on his meds.  I reconciled his pill box for 1 week.  I picked these up from [facility] and delivered them to him this evening.       Beatrix Shipper, EMT-Paramedic [phone] 08/06/2022

## 2022-08-11 NOTE — Progress Notes (Unsigned)
  Care Coordination  Outreach Note  08/11/2022 Name: ROGAN WIGLEY MRN: [id number] DOB: 12/07/1940   Care Coordination Outreach Attempts: An unsuccessful telephone outreach was attempted today to offer the patient information about available care coordination services as a benefit of their health plan.   Follow Up Plan:  Additional outreach attempts will be made to offer the patient care coordination information and services.   Encounter Outcome:  No Answer  Christie Nottingham  Care Coordination Care Guide  Direct Dial: [phone]

## 2022-08-12 NOTE — Progress Notes (Unsigned)
  Care Coordination  Outreach Note  08/12/2022 Name: MUSCAB BRENNEMAN MRN: [id number] DOB: Jul 27, 1941   Care Coordination Outreach Attempts: A second unsuccessful outreach was attempted today to offer the patient with information about available care coordination services as a benefit of their health plan.     Follow Up Plan:  Additional outreach attempts will be made to offer the patient care coordination information and services.   Encounter Outcome:  No Answer  Christie Nottingham  Care Coordination Care Guide  Direct Dial: [phone]

## 2022-08-13 ENCOUNTER — Telehealth: Payer: Self-pay | Admitting: *Deleted

## 2022-08-13 NOTE — Patient Outreach (Signed)
Called team member DeDe Katrinka Blazing, paramedicine, to request collaboration to see Mr. Witham with her on her next home visit. She agreed and we scheduled the appt for Thursday am.  Requested NP supervisor, Elias Else, MD, assistance for insight into why pt was discharged from [facility]. Pt does not have a PCP now. Dr. Nicholos Johns was not able to provide any recent activity with Deboraha Sprang MD.   NP to interview pt tomorrow and hope to discover circumstances of PCP discharge.    Zara Council. Burgess Estelle, MSN, [facility] Gerontological Nurse Practitioner [facility] Care Management [phone]

## 2022-08-13 NOTE — Progress Notes (Signed)
RNCM will attend paramedicine visit with DeDe 08/14/22  Closing out call.

## 2022-08-14 ENCOUNTER — Telehealth: Payer: Self-pay | Admitting: *Deleted

## 2022-08-14 ENCOUNTER — Encounter: Payer: Self-pay | Admitting: *Deleted

## 2022-08-14 ENCOUNTER — Other Ambulatory Visit (HOSPITAL_COMMUNITY): Payer: Self-pay | Admitting: Emergency Medicine

## 2022-08-14 ENCOUNTER — Other Ambulatory Visit: Payer: Self-pay | Admitting: *Deleted

## 2022-08-14 ENCOUNTER — Telehealth (HOSPITAL_COMMUNITY): Payer: Self-pay | Admitting: Emergency Medicine

## 2022-08-14 DIAGNOSIS — N1832 Chronic kidney disease, stage 3b: Secondary | ICD-10-CM | POA: Diagnosis not present

## 2022-08-14 DIAGNOSIS — I130 Hypertensive heart and chronic kidney disease with heart failure and stage 1 through stage 4 chronic kidney disease, or unspecified chronic kidney disease: Secondary | ICD-10-CM | POA: Diagnosis not present

## 2022-08-14 DIAGNOSIS — I4819 Other persistent atrial fibrillation: Secondary | ICD-10-CM | POA: Diagnosis not present

## 2022-08-14 DIAGNOSIS — I5043 Acute on chronic combined systolic (congestive) and diastolic (congestive) heart failure: Secondary | ICD-10-CM | POA: Diagnosis not present

## 2022-08-14 DIAGNOSIS — E1122 Type 2 diabetes mellitus with diabetic chronic kidney disease: Secondary | ICD-10-CM | POA: Diagnosis not present

## 2022-08-14 DIAGNOSIS — I082 Rheumatic disorders of both aortic and tricuspid valves: Secondary | ICD-10-CM | POA: Diagnosis not present

## 2022-08-14 NOTE — Patient Outreach (Signed)
  Care Coordination   Home Visit Note   08/14/2022 Name: Albert Powell MRN: [id number] DOB: 11-28-1940  Albert Powell is a 81 y.o. year old male who sees Patient, No Pcp Per for primary care. I visited  Albert Powell in their home today.  What matters to the patients health and wellness today?  To be safe. Albert Powell left his home on 08/04/22 with the intention of going to the [facility] for an appt. He never arrived. He got lost and some how was brought home about 12 hours later by some concerned citizen that assisted him. He arrived at home early in the am, like 2:00 am. His family was frantic. A police report was initiated by his daughter. There was an APS report and visitation by a LCSW.   Paramedicine has been visiting pt since his last hospitalization in October and he has not required ED since then. Pt also has home health now by [facility].    Goals Addressed             This Visit's Progress    To be safe.       Care Coordination Interventions: Evaluation of current treatment plan related to GENERAL HEALTH/CARDIAC STATUS and patient's adherence to plan as established by provider Advised patient to TAKE MEDS AS ORDERED. NO DRIVING (HE HAS NO CAR AT THIS TIME) Provided education to patient re: SAFETY AND LIKELIHOOD THAT LTC PLACEMENT WILL BE NEEDED Reviewed medications with patient and discussed RESULT OF DEPRESSION ASSESSMENT - MODERATELY DEPRESSED WHICH CAN CONTRIBUTE TO MEMORY PROBLEMS. NEEDS TREATMENT,  NP TO ADVISE NEW PCP. Collaborated with PARAMEDICINE, [facility] AND [facility] regarding PT NEEDS Discussed plans with patient for ongoing care management follow up and provided patient with direct contact information for care management team Screening for signs and symptoms of depression related to chronic disease state  Assessed social determinant of health barriers Depression screen reviewed  PHQ2/PHQ9 completed Solution-Focused Strategies  employed:  Active listening / Reflection utilized  Emotional Support Provided Problem Solving /Task Center strategies reviewed Caregiver stress acknowledged   [place] COGNITIVE ASSESSMENT SCORE 16/30 PHQ9 SCORE 12  TALKED WITH PT REGARDING POSSIBLE NEED FOR LTC PLACMENT. HE IS OPEN MINDED ABOUT THIS.  TALKED WITH PT DAUGHTER SUZY DUBEL FOR 55 MINUTES PROVIDING RESULTS OF NP ASSESSMENT AND ROLE IN CURRENT PLAN OF CARE. ENCOURAGED HER TO BEGIN TALKING WITH FACILITIES AND VISITING. EDUCATED ON PROCESS OF BEING ADMITTED TO LTC.  RECOMMEND ENGAGEMENT WITH [facility] FOR PRIMARY CARE  FOR IMMEDIATE NEEDS.  RECOMMEND APPLICATION FOR MEDICAID FOR LTC   RECOMMEND MOM'S MEALS (LOW SODIUM) TO ASSIST WITH PT HF MGMT.           SDOH assessments and interventions completed:  Yes  SDOH Interventions Today    Flowsheet Row Most Recent Value  SDOH Interventions   Food Insecurity Interventions Intervention Not Indicated  Housing Interventions Intervention Not Indicated  Transportation Interventions Intervention Not Indicated  Utilities Interventions Intervention Not Indicated  Depression Interventions/Treatment  --  [Referring to new PCP for treatment]        Care Coordination Interventions:  Yes, provided   Follow up plan: Follow up call scheduled for 1 week.    Encounter Outcome:  Pt. Visit Completed   Noralyn Pick C. Burgess Estelle, MSN, [facility]enterology Gerontological Nurse Practitioner [facility] Care Management [phone]

## 2022-08-14 NOTE — Telephone Encounter (Signed)
Long discussion w/ Janie Morning regarding visit to day with Almetta Lovely w/ [facility] and Sarah LPN w/ [facility].   Primary reason for call was to advise her that I had picked up Mr. Derwin medications from [facility] and advised her that the credit card information on file was not valid.  I understand this is due to the recent events of losing his wallet and changes in bank info.  I gave her the number to [facility] and advised her of the balance due of $242.45.  Thomasene Lot advised she would take care of same.    Beatrix Shipper, EMT-Paramedic [phone] 08/14/2022

## 2022-08-14 NOTE — Progress Notes (Signed)
Paramedicine Encounter    Patient ID: Albert Powell, male    DOB: 03/08/1941, 81 y.o.   MRN: [id number]   BP (!) 160/70   Wt 198 lb 12.8 oz (90.2 kg)   BMI 29.36 kg/m  Weight yesterday-not taken Last visit weight-203lb  ATF Albert Powell A&O x 4, skin W&D w/ good color.  Pt has no complaints of chest pain or SOB.  Lung sounds clear and equal bilat.  He has done well with is meds.  [facility] did not deliver the refills as requested so I will have to go and pick them up and come back this afternoon to complete med box reconciliation.   Today Albert Powell w/ Albert Powell and Albert Roch, Albert Powell w/ Albert Powell shadowed my visit.  Albert Powell w/ Albert Powell did a cognitive assessment which continued after I cleared my portion of the visit.  We did discuss getting Albert Powell involved w/ Albert Powell care and the goal is to get him scheduled for a home visit ASAP.  This will be spear headed by  Albert Powell w/ [place].   Home visit complete.    Albert Powell, [place] 08/16/2022  Patient Care Team: Patient, No Pcp Per as PCP - General ([place]) Lorretta Harp, MD as PCP - Cardiology (Cardiology) Lorretta Harp, MD as Consulting Physician (Cardiology)  Patient Active Problem List   Diagnosis Date Noted   Hypokalemia 06/23/2022   Elevated troponin 06/23/2022   Type 2 diabetes mellitus with hyperlipidemia ([place]) 05/19/2022   Acute combined systolic and diastolic heart failure ([place])    Acute kidney injury superimposed on chronic kidney disease ([place]) 05/13/2022   GERD (gastroesophageal reflux disease) 05/13/2022   Acquired hypothyroidism 05/13/2022   Acute on chronic systolic CHF (congestive heart failure) ([place]) 11/23/2021   PAF (paroxysmal atrial fibrillation) ([place]) 10/08/2021   Acute on chronic diastolic heart failure ([place]) [id number]   Acute metabolic encephalopathy    CHF (congestive heart failure) ([place]) 03/10/2021   Uncontrolled hypertension 03/10/2021   Hypertensive  crisis 01/01/2021   ACS (acute coronary syndrome) ([place])    AKI (acute kidney injury) ([place])    Atrial fibrillation ([place]) 03/30/2020   Contusion of right hand 11/12/2018   Primary osteoarthritis of both first carpometacarpal joints 11/12/2018   Pain in joint of right shoulder 10/05/2018   Pain in right hand 10/05/2018   Hematoma 03/30/2018   Degeneration of lumbar intervertebral disc 12/21/2017   Lumbar radiculopathy 12/21/2017   Vertigo 04/09/2017   Hypogonadism in male 12/07/2014   Screening for prostate cancer 12/07/2014   Claudication of right lower extremity ([place]) 10/20/2013   Claudication in peripheral vascular disease- Rt leg 09/20/2013   Obesity (BMI 30-39.9)- negative sleep study in the past 09/20/2013   Occlusion and stenosis of carotid artery without mention of cerebral infarction 05/23/2013   PAD (peripheral artery disease) ([place]) 05/18/2013   Carotid artery disease ([place]) 04/14/2013   Bradycardia, sinus 02/18/2013   Tobacco abuse 02/05/2013   Chest pain, unstable angina, negative MI 02/04/2013   Type 2 diabetes mellitus with stage 3b chronic kidney disease ([place]) 02/04/2013   Coronary artery disease involving native coronary artery of native heart without angina pectoris 02/04/2013   Benign essential hypertension 07/10/2011   Incisional hernia 05/22/2011    Current Outpatient Medications:    amiodarone (PACERONE) 200 MG tablet, Take 1 tablet (200 mg total) by mouth 2 (two) times daily., Disp: 60 tablet, Rfl: 3   amLODipine (NORVASC) 10 MG tablet, Take 1 tablet (10  mg total) by mouth daily., Disp: 30 tablet, Rfl: 11   apixaban (ELIQUIS) 2.5 MG TABS tablet, Take 1 tablet (2.5 mg total) by mouth 2 (two) times daily., Disp: 60 tablet, Rfl: 3   atorvastatin (LIPITOR) 40 MG tablet, Take 1 tablet (40 mg total) by mouth daily., Disp: 30 tablet, Rfl: 0   carvedilol (COREG) 25 MG tablet, Take 1 tablet (25 mg total) by mouth 2 (two) times daily with a meal., Disp: 60 tablet, Rfl: 3    clopidogrel (PLAVIX) 75 MG tablet, Take 1 tablet (75 mg total) by mouth daily., Disp: 30 tablet, Rfl: 1   hydrALAZINE (APRESOLINE) 100 MG tablet, Take 1 tablet (100 mg total) by mouth 3 (three) times daily., Disp: 90 tablet, Rfl: 0   isosorbide mononitrate (IMDUR) 120 MG 24 hr tablet, Take 1 tablet (120 mg total) by mouth daily., Disp: 30 tablet, Rfl: 3   levothyroxine (SYNTHROID) 25 MCG tablet, TAKE ONE TABLET BY MOUTH DAILY BEFORE BREAKFAST (Patient taking differently: Take 25 mcg by mouth daily before breakfast.), Disp: 90 tablet, Rfl: 3   pantoprazole (PROTONIX) 40 MG tablet, Take 1 tablet (40 mg total) by mouth every other day., Disp: 30 tablet, Rfl: 3   spironolactone (ALDACTONE) 25 MG tablet, Take 1 tablet (25 mg total) by mouth daily., Disp: 30 tablet, Rfl: 3   torsemide (DEMADEX) 20 MG tablet, Take 2 tablets (40 mg total) by mouth daily., Disp: 120 tablet, Rfl: 4   glipiZIDE (GLUCOTROL) 5 MG tablet, Take 5 mg by mouth daily before breakfast. (Patient not taking: Reported on 08/06/2022), Disp: , Rfl:  Allergies  Allergen Reactions   Fentanyl Other (See Comments)    Behavioral changes   Gabapentin Swelling   Lisinopril Other (See Comments)    unknown   Lyrica [Pregabalin] Other (See Comments)    unknown   Metformin Diarrhea   Propofol Other (See Comments)    Heart rate dropped      Social History   Socioeconomic History   Marital status: Married    Spouse name: Freeman Zesati   Number of children: 1   Years of education: Not on file   Highest education level: High school graduate  Occupational History   Occupation: retired  Tobacco Use   Smoking status: Former    Packs/day: 1.50    Years: 57.00    Total pack years: 85.50    Types: Cigarettes, E-cigarettes    Quit date: 2020    Years since quitting: 3.9   Smokeless tobacco: Never   Tobacco comments:    pt states that he is using the vapor cigs--12/2021  Vaping Use   Vaping Use: Former   Start date: 09/15/2016    Substances: Nicotine  Substance and Sexual Activity   Alcohol use: Yes    Alcohol/week: 1.0 standard drink of alcohol    Types: 1 Glasses of wine per week    Comment: "very little"   Drug use: No   Sexual activity: Not on file  Other Topics Concern   Not on file  Social History Narrative   Not on file   Social Determinants of Powell   Financial Resource Strain: Low Risk  (06/03/2022)   Overall Financial Resource Strain (CARDIA)    Difficulty of Paying Living Expenses: Not hard at all  Food Insecurity: No Food Insecurity (07/02/2022)   Hunger Vital Sign    Worried About Running Out of Food in the Last Year: Never true    Ran Out of Food in the  Last Year: Never true  Transportation Needs: No Transportation Needs (07/02/2022)   PRAPARE - Administrator, Civil Service (Medical): No    Lack of Transportation (Non-Medical): No  Physical Activity: Not on file  Stress: Not on file  Social Connections: Not on file  Intimate Partner Violence: Not At Risk (07/02/2022)   Humiliation, Afraid, Rape, and Kick questionnaire    Fear of Current or Ex-Partner: No    Emotionally Abused: No    Physically Abused: No    Sexually Abused: No    Physical Exam      No future appointments.     Beatrix Shipper, EMT-P-Paramedic [phone] [facility] Paramedic  08/14/22

## 2022-08-14 NOTE — Progress Notes (Signed)
Paramedicine Encounter    Patient ID: Albert Powell, male    DOB: July 13, 1941, 81 y.o.   MRN: [id number]   There were no vitals taken for this visit. Weight yesterday-*** Last visit weight-***  Patient Care Team: Patient, No Pcp Per as PCP - General (General Practice) [name], MD as PCP - Cardiology (Cardiology) [name], MD as Consulting Physician (Cardiology)  Patient Active Problem List   Diagnosis Date Noted   Hypokalemia 06/23/2022   Elevated troponin 06/23/2022   Type 2 diabetes mellitus with hyperlipidemia ([name]) 05/19/2022   Acute combined systolic and diastolic heart failure ([name])    Acute kidney injury superimposed on chronic kidney disease ([name]) 05/13/2022   GERD (gastroesophageal reflux disease) 05/13/2022   Acquired hypothyroidism 05/13/2022   Acute on chronic systolic CHF (congestive heart failure) ([name]) 11/23/2021   PAF (paroxysmal atrial fibrillation) ([name]) 10/08/2021   Acute on chronic diastolic heart failure ([name]) 06/07/2021   Acute metabolic encephalopathy    CHF (congestive heart failure) ([name]) 03/10/2021   Uncontrolled hypertension 03/10/2021   Hypertensive crisis 01/01/2021   ACS (acute coronary syndrome) ([name])    AKI (acute kidney injury) ([name])    Atrial fibrillation ([name]) 03/30/2020   Contusion of right hand 11/12/2018   Primary osteoarthritis of both first carpometacarpal joints 11/12/2018   Pain in joint of right shoulder 10/05/2018   Pain in right hand 10/05/2018   Hematoma 03/30/2018   Degeneration of lumbar intervertebral disc 12/21/2017   Lumbar radiculopathy 12/21/2017   Vertigo 04/09/2017   Hypogonadism in male 12/07/2014   Screening for prostate cancer 12/07/2014   Claudication of right lower extremity ([name]) 10/20/2013   Claudication in peripheral vascular disease- Rt leg 09/20/2013   Obesity (BMI 30-39.9)- negative sleep study in the past 09/20/2013   Occlusion and stenosis of carotid artery without mention of cerebral  infarction 05/23/2013   PAD (peripheral artery disease) ([name]) 05/18/2013   Carotid artery disease ([name]) 04/14/2013   Bradycardia, sinus 02/18/2013   Tobacco abuse 02/05/2013   Chest pain, unstable angina, negative MI 02/04/2013   Type 2 diabetes mellitus with stage 3b chronic kidney disease ([name]) 02/04/2013   Coronary artery disease involving native coronary artery of native heart without angina pectoris 02/04/2013   Benign essential hypertension 07/10/2011   Incisional hernia 05/22/2011    Current Outpatient Medications:    amiodarone (PACERONE) 200 MG tablet, Take 1 tablet (200 mg total) by mouth 2 (two) times daily., Disp: 60 tablet, Rfl: 3   amLODipine (NORVASC) 10 MG tablet, Take 1 tablet (10 mg total) by mouth daily., Disp: 30 tablet, Rfl: 11   apixaban (ELIQUIS) 2.5 MG TABS tablet, Take 1 tablet (2.5 mg total) by mouth 2 (two) times daily., Disp: 60 tablet, Rfl: 3   atorvastatin (LIPITOR) 40 MG tablet, Take 1 tablet (40 mg total) by mouth daily., Disp: 30 tablet, Rfl: 11   carvedilol (COREG) 25 MG tablet, Take 1 tablet (25 mg total) by mouth 2 (two) times daily with a meal., Disp: 60 tablet, Rfl: 3   clopidogrel (PLAVIX) 75 MG tablet, Take 1 tablet (75 mg total) by mouth daily., Disp: 30 tablet, Rfl: 11   hydrALAZINE (APRESOLINE) 100 MG tablet, Take 1 tablet (100 mg total) by mouth 3 (three) times daily., Disp: 90 tablet, Rfl: 0   isosorbide mononitrate (IMDUR) 120 MG 24 hr tablet, Take 1 tablet (120 mg total) by mouth daily., Disp: 30 tablet, Rfl: 11   pantoprazole (PROTONIX) 40 MG tablet, Take 1 tablet (  40 mg total) by mouth every other day., Disp: 30 tablet, Rfl: 3   spironolactone (ALDACTONE) 25 MG tablet, Take 1 tablet (25 mg total) by mouth daily., Disp: 30 tablet, Rfl: 3   torsemide (DEMADEX) 20 MG tablet, Take 2 tablets (40 mg total) by mouth daily., Disp: 120 tablet, Rfl: 4   glipiZIDE (GLUCOTROL) 5 MG tablet, Take 5 mg by mouth daily before breakfast. (Patient not taking:  Reported on 08/06/2022), Disp: , Rfl:    levothyroxine (SYNTHROID) 25 MCG tablet, TAKE ONE TABLET BY MOUTH DAILY BEFORE BREAKFAST (Patient not taking: Reported on 08/14/2022), Disp: 90 tablet, Rfl: 3 Allergies  Allergen Reactions   Fentanyl Other (See Comments)    Behavioral changes   Gabapentin Swelling   Lisinopril Other (See Comments)    unknown   Lyrica [Pregabalin] Other (See Comments)    unknown   Metformin Diarrhea   Propofol Other (See Comments)    Heart rate dropped      Social History   Socioeconomic History   Marital status: Married    Spouse name: Tavious Griesinger   Number of children: 1   Years of education: Not on file   Highest education level: High school graduate  Occupational History   Occupation: retired  Tobacco Use   Smoking status: Former    Packs/day: 1.50    Years: 57.00    Total pack years: 85.50    Types: Cigarettes, E-cigarettes    Quit date: 2020    Years since quitting: 3.9   Smokeless tobacco: Never   Tobacco comments:    pt states that he is using the vapor cigs--12/2021  Vaping Use   Vaping Use: Former   Start date: 09/15/2016   Substances: Nicotine  Substance and Sexual Activity   Alcohol use: Yes    Alcohol/week: 1.0 standard drink of alcohol    Types: 1 Glasses of wine per week    Comment: "very little"   Drug use: No   Sexual activity: Not on file  Other Topics Concern   Not on file  Social History Narrative   Not on file   Social Determinants of Health   Financial Resource Strain: Low Risk  (06/03/2022)   Overall Financial Resource Strain (CARDIA)    Difficulty of Paying Living Expenses: Not hard at all  Food Insecurity: No Food Insecurity (08/14/2022)   Hunger Vital Sign    Worried About Running Out of Food in the Last Year: Never true    Ran Out of Food in the Last Year: Never true  Transportation Needs: No Transportation Needs (08/14/2022)   PRAPARE - Administrator, Civil Service (Medical): No    Lack  of Transportation (Non-Medical): No  Physical Activity: Not on file  Stress: Not on file  Social Connections: Not on file  Intimate Partner Violence: Not At Risk (08/14/2022)   Humiliation, Afraid, Rape, and Kick questionnaire    Fear of Current or Ex-Partner: No    Emotionally Abused: No    Physically Abused: No    Sexually Abused: No    Physical Exam      Future Appointments  Date Time Provider Department Center  09/05/2022 12:00 PM [facility] PA/NP [facility] None       Beatrix Shipper, EMT-P-Paramedic [phone] [facility] Paramedic  08/15/22

## 2022-08-15 ENCOUNTER — Other Ambulatory Visit (HOSPITAL_COMMUNITY): Payer: Self-pay | Admitting: *Deleted

## 2022-08-15 DIAGNOSIS — E1122 Type 2 diabetes mellitus with diabetic chronic kidney disease: Secondary | ICD-10-CM | POA: Diagnosis not present

## 2022-08-15 DIAGNOSIS — I5043 Acute on chronic combined systolic (congestive) and diastolic (congestive) heart failure: Secondary | ICD-10-CM | POA: Diagnosis not present

## 2022-08-15 DIAGNOSIS — I4819 Other persistent atrial fibrillation: Secondary | ICD-10-CM | POA: Diagnosis not present

## 2022-08-15 DIAGNOSIS — N1832 Chronic kidney disease, stage 3b: Secondary | ICD-10-CM | POA: Diagnosis not present

## 2022-08-15 DIAGNOSIS — I130 Hypertensive heart and chronic kidney disease with heart failure and stage 1 through stage 4 chronic kidney disease, or unspecified chronic kidney disease: Secondary | ICD-10-CM | POA: Diagnosis not present

## 2022-08-15 DIAGNOSIS — I082 Rheumatic disorders of both aortic and tricuspid valves: Secondary | ICD-10-CM | POA: Diagnosis not present

## 2022-08-15 MED ORDER — CLOPIDOGREL BISULFATE 75 MG PO TABS: 75.0000 mg | ORAL_TABLET | Freq: Every day | ORAL | 11 refills | Status: DC

## 2022-08-15 MED ORDER — ISOSORBIDE MONONITRATE ER 120 MG PO TB24: 120.0000 mg | ORAL_TABLET | Freq: Every day | ORAL | 11 refills | Status: DC

## 2022-08-15 MED ORDER — ATORVASTATIN CALCIUM 40 MG PO TABS: 40.0000 mg | ORAL_TABLET | Freq: Every day | ORAL | 11 refills | Status: DC

## 2022-08-21 ENCOUNTER — Telehealth: Payer: Self-pay | Admitting: *Deleted

## 2022-08-21 ENCOUNTER — Telehealth (HOSPITAL_COMMUNITY): Payer: Self-pay | Admitting: Licensed Clinical Social Worker

## 2022-08-21 ENCOUNTER — Other Ambulatory Visit (HOSPITAL_COMMUNITY): Payer: Self-pay | Admitting: Emergency Medicine

## 2022-08-21 DIAGNOSIS — I130 Hypertensive heart and chronic kidney disease with heart failure and stage 1 through stage 4 chronic kidney disease, or unspecified chronic kidney disease: Secondary | ICD-10-CM | POA: Diagnosis not present

## 2022-08-21 DIAGNOSIS — I082 Rheumatic disorders of both aortic and tricuspid valves: Secondary | ICD-10-CM | POA: Diagnosis not present

## 2022-08-21 DIAGNOSIS — I5043 Acute on chronic combined systolic (congestive) and diastolic (congestive) heart failure: Secondary | ICD-10-CM | POA: Diagnosis not present

## 2022-08-21 DIAGNOSIS — N1832 Chronic kidney disease, stage 3b: Secondary | ICD-10-CM | POA: Diagnosis not present

## 2022-08-21 DIAGNOSIS — I4819 Other persistent atrial fibrillation: Secondary | ICD-10-CM | POA: Diagnosis not present

## 2022-08-21 DIAGNOSIS — I5032 Chronic diastolic (congestive) heart failure: Secondary | ICD-10-CM

## 2022-08-21 DIAGNOSIS — E1122 Type 2 diabetes mellitus with diabetic chronic kidney disease: Secondary | ICD-10-CM | POA: Diagnosis not present

## 2022-08-21 NOTE — Progress Notes (Signed)
Paramedicine Encounter    Patient ID: Albert Powell, male    DOB: 1940-11-07, 81 y.o.   MRN: [id number]   BP (!) 170/90 (BP Location: Left Arm, Patient Position: Sitting, Cuff Size: Normal)   Pulse 65   Resp 16   SpO2 97%  Weight yesterday-not taken Last visit weight-198lb  ATF Albert Powell still in bed but awake A&O x 4.  He denies chest pain or SOB.  Lung sounds are clear and equal bilat and no edema noted.  He continues to miss his noon dose of Hydralazine and has missed 2 doses out of the week of his PM doses.  Med box reconciled x 1 week.  Reinforced the importance of med compliance.  His wife tries to help him but she has some obvious signs of dementia and has trouble remembering things herself.  Cardiomems reading taken.  This is only getting done when I do his visit and is often times very difficult to get a good position/reading.  No refills needed at this time.   He [facility].  Message sent to [facility] to get transportation set up for him.  Home visit complete.    Albert Powell, EMT-Paramedic [phone] 08/22/2022    Patient Care Team: Patient, No Pcp Per as PCP - General (General Practice) [name], MD as PCP - Cardiology (Cardiology) [name], MD as Consulting Physician (Cardiology)  Patient Active Problem List   Diagnosis Date Noted   Hypokalemia 06/23/2022   Elevated troponin 06/23/2022   Type 2 diabetes mellitus with hyperlipidemia ([name]) 05/19/2022   Acute combined systolic and diastolic heart failure ([name])    Acute kidney injury superimposed on chronic kidney disease ([name]) 05/13/2022   GERD (gastroesophageal reflux disease) 05/13/2022   Acquired hypothyroidism 05/13/2022   Acute on chronic systolic CHF (congestive heart failure) ([name]) 11/23/2021   PAF (paroxysmal atrial fibrillation) ([name]) 10/08/2021   Acute on chronic diastolic heart failure ([name]) 06/07/2021   Acute metabolic encephalopathy    CHF (congestive  heart failure) ([name]) 03/10/2021   Uncontrolled hypertension 03/10/2021   Hypertensive crisis 01/01/2021   ACS (acute coronary syndrome) ([name])    AKI (acute kidney injury) ([name])    Atrial fibrillation ([name]) 03/30/2020   Contusion of right hand 11/12/2018   Primary osteoarthritis of both first carpometacarpal joints 11/12/2018   Pain in joint of right shoulder 10/05/2018   Pain in right hand 10/05/2018   Hematoma 03/30/2018   Degeneration of lumbar intervertebral disc 12/21/2017   Lumbar radiculopathy 12/21/2017   Vertigo 04/09/2017   Hypogonadism in male 12/07/2014   Screening for prostate cancer 12/07/2014   Claudication of right lower extremity ([name]) 10/20/2013   Claudication in peripheral vascular disease- Rt leg 09/20/2013   Obesity (BMI 30-39.9)- negative sleep study in the past 09/20/2013   Occlusion and stenosis of carotid artery without mention of cerebral infarction 05/23/2013   PAD (peripheral artery disease) ([name]) 05/18/2013   Carotid artery disease ([name]) 04/14/2013   Bradycardia, sinus 02/18/2013   Tobacco abuse 02/05/2013   Chest pain, unstable angina, negative MI 02/04/2013   Type 2 diabetes mellitus with stage 3b chronic kidney disease ([name]) 02/04/2013   Coronary artery disease involving native coronary artery of native heart without angina pectoris 02/04/2013   Benign essential hypertension 07/10/2011   Incisional hernia 05/22/2011    Current Outpatient Medications:    amiodarone (PACERONE) 200 MG tablet, Take 1 tablet (200 mg total) by mouth 2 (two) times  daily., Disp: 60 tablet, Rfl: 3   amLODipine (NORVASC) 10 MG tablet, Take 1 tablet (10 mg total) by mouth daily., Disp: 30 tablet, Rfl: 11   apixaban (ELIQUIS) 2.5 MG TABS tablet, Take 1 tablet (2.5 mg total) by mouth 2 (two) times daily., Disp: 60 tablet, Rfl: 3   atorvastatin (LIPITOR) 40 MG tablet, Take 1 tablet (40 mg total) by mouth daily., Disp: 30 tablet, Rfl: 11   carvedilol (COREG) 25 MG tablet, Take 1  tablet (25 mg total) by mouth 2 (two) times daily with a meal., Disp: 60 tablet, Rfl: 3   clopidogrel (PLAVIX) 75 MG tablet, Take 1 tablet (75 mg total) by mouth daily., Disp: 30 tablet, Rfl: 11   hydrALAZINE (APRESOLINE) 100 MG tablet, Take 1 tablet (100 mg total) by mouth 3 (three) times daily., Disp: 90 tablet, Rfl: 0   isosorbide mononitrate (IMDUR) 120 MG 24 hr tablet, Take 1 tablet (120 mg total) by mouth daily., Disp: 30 tablet, Rfl: 11   pantoprazole (PROTONIX) 40 MG tablet, Take 1 tablet (40 mg total) by mouth every other day., Disp: 30 tablet, Rfl: 3   spironolactone (ALDACTONE) 25 MG tablet, Take 1 tablet (25 mg total) by mouth daily., Disp: 30 tablet, Rfl: 3   torsemide (DEMADEX) 20 MG tablet, Take 2 tablets (40 mg total) by mouth daily., Disp: 120 tablet, Rfl: 4   glipiZIDE (GLUCOTROL) 5 MG tablet, Take 5 mg by mouth daily before breakfast. (Patient not taking: Reported on 08/06/2022), Disp: , Rfl:    levothyroxine (SYNTHROID) 25 MCG tablet, TAKE ONE TABLET BY MOUTH DAILY BEFORE BREAKFAST (Patient not taking: Reported on 08/14/2022), Disp: 90 tablet, Rfl: 3 Allergies  Allergen Reactions   Fentanyl Other (See Comments)    Behavioral changes   Gabapentin Swelling   Lisinopril Other (See Comments)    unknown   Lyrica [Pregabalin] Other (See Comments)    unknown   Metformin Diarrhea   Propofol Other (See Comments)    Heart rate dropped      Social History   Socioeconomic History   Marital status: Married    Spouse name: Keyonta Barradas   Number of children: 1   Years of education: Not on file   Highest education level: High school graduate  Occupational History   Occupation: retired  Tobacco Use   Smoking status: Former    Packs/day: 1.50    Years: 57.00    Total pack years: 85.50    Types: Cigarettes, E-cigarettes    Quit date: 2020    Years since quitting: 3.9   Smokeless tobacco: Never   Tobacco comments:    pt states that he is using the vapor cigs--12/2021   Vaping Use   Vaping Use: Former   Start date: 09/15/2016   Substances: Nicotine  Substance and Sexual Activity   Alcohol use: Yes    Alcohol/week: 1.0 standard drink of alcohol    Types: 1 Glasses of wine per week    Comment: "very little"   Drug use: No   Sexual activity: Not on file  Other Topics Concern   Not on file  Social History Narrative   Not on file   Social Determinants of Health   Financial Resource Strain: Low Risk  (06/03/2022)   Overall Financial Resource Strain (CARDIA)    Difficulty of Paying Living Expenses: Not hard at all  Food Insecurity: No Food Insecurity (08/14/2022)   Hunger Vital Sign    Worried About Running Out of Food in the Last  Year: Never true    Ran Out of Food in the Last Year: Never true  Transportation Needs: No Transportation Needs (08/14/2022)   PRAPARE - Administrator, Civil Service (Medical): No    Lack of Transportation (Non-Medical): No  Physical Activity: Not on file  Stress: Not on file  Social Connections: Not on file  Intimate Partner Violence: Not At Risk (08/14/2022)   Humiliation, Afraid, Rape, and Kick questionnaire    Fear of Current or Ex-Partner: No    Emotionally Abused: No    Physically Abused: No    Sexually Abused: No    Physical Exam      Future Appointments  Date Time Provider Department Center  09/05/2022 12:00 PM [facility] PA/NP [facility] None       Albert Powell, EMT-P-Paramedic [phone] [facility] Paramedic  08/21/22

## 2022-08-21 NOTE — Patient Outreach (Signed)
INCOMING CALL FROM DEDE Katrinka Blazing, PARAMEDICINE. SHE PROVIDED UPDATES INCLUDING PT CAR WAS LOCATED AND DAUGHTER AND MRS. Ferrie WENT TO RETRIEVE THE CAR AND BRING IT HOME.  WE DISCUSSED THAT EDUCATION NEEDS TO BE REINFORCED TO PT DAUGHTER SUZY WHO ALLOWED MRS. Climer TO DRIVE THE CAR HOME.  DEDE TO CONTACT ASSIGNED ADORATION LPN TO FOLLOW UP ON [facility]  FOR PRIMARY CARE REFERRAL.  THIS NP SENT BRIEF NOTE TO AND MADE TELEPHONE CALL TO AUGMENT THE URGENCY OF THE REFERRAL. MR. Vermeer HAS MODERATE COGNITIVE IMPAIRMENT 16/30 ON [place] COGNITIVE ASSESSMENT AND A PHQ9 SCORE OF 12 WHICH INDICATES MODERATE DEPRESSION WHICH BOTH NEED TO BE ADDRESSED BY A PRIMARY CARE PROVIDER.  DEDE TO KEEP NP APPRAISED OF DEVELOPMENTS.  Zara Council. Burgess Estelle, MSN, [facility] Gerontological Nurse Practitioner [facility] Care Management [phone]

## 2022-08-21 NOTE — Telephone Encounter (Signed)
H&V Care Navigation CSW Progress Note  Clinical Social Worker informed by [employer] paramedic that pt is in need of transportation to his upcoming clinic appt as last time he tried to drive himself and became lost of 16 hours so no longer advisable for him to drive.  CSW consulted [facility] to assist with transportation.   SDOH Screenings   Food Insecurity: No Food Insecurity (08/14/2022)  Housing: Low Risk  (08/14/2022)  Transportation Needs: Unmet Transportation Needs (08/21/2022)  Utilities: Not At Risk (08/14/2022)  Alcohol Screen: Low Risk  (06/03/2022)  Depression (PHQ2-9): High Risk (08/14/2022)  Financial Resource Strain: Low Risk  (06/03/2022)  Tobacco Use: Medium Risk (08/14/2022)    Burna Sis, LCSW Clinical Social Worker [facility] Desk#: [phone] Cell#: [phone]

## 2022-08-25 DIAGNOSIS — N1832 Chronic kidney disease, stage 3b: Secondary | ICD-10-CM | POA: Diagnosis not present

## 2022-08-25 DIAGNOSIS — E1122 Type 2 diabetes mellitus with diabetic chronic kidney disease: Secondary | ICD-10-CM | POA: Diagnosis not present

## 2022-08-25 DIAGNOSIS — I130 Hypertensive heart and chronic kidney disease with heart failure and stage 1 through stage 4 chronic kidney disease, or unspecified chronic kidney disease: Secondary | ICD-10-CM | POA: Diagnosis not present

## 2022-08-25 DIAGNOSIS — I082 Rheumatic disorders of both aortic and tricuspid valves: Secondary | ICD-10-CM | POA: Diagnosis not present

## 2022-08-25 DIAGNOSIS — I4819 Other persistent atrial fibrillation: Secondary | ICD-10-CM | POA: Diagnosis not present

## 2022-08-25 DIAGNOSIS — I5043 Acute on chronic combined systolic (congestive) and diastolic (congestive) heart failure: Secondary | ICD-10-CM | POA: Diagnosis not present

## 2022-08-26 DIAGNOSIS — I082 Rheumatic disorders of both aortic and tricuspid valves: Secondary | ICD-10-CM | POA: Diagnosis not present

## 2022-08-26 DIAGNOSIS — I5043 Acute on chronic combined systolic (congestive) and diastolic (congestive) heart failure: Secondary | ICD-10-CM | POA: Diagnosis not present

## 2022-08-26 DIAGNOSIS — I130 Hypertensive heart and chronic kidney disease with heart failure and stage 1 through stage 4 chronic kidney disease, or unspecified chronic kidney disease: Secondary | ICD-10-CM | POA: Diagnosis not present

## 2022-08-26 DIAGNOSIS — I4819 Other persistent atrial fibrillation: Secondary | ICD-10-CM | POA: Diagnosis not present

## 2022-08-26 DIAGNOSIS — N1832 Chronic kidney disease, stage 3b: Secondary | ICD-10-CM | POA: Diagnosis not present

## 2022-08-26 DIAGNOSIS — E1122 Type 2 diabetes mellitus with diabetic chronic kidney disease: Secondary | ICD-10-CM | POA: Diagnosis not present

## 2022-08-27 ENCOUNTER — Telehealth: Payer: Self-pay | Admitting: *Deleted

## 2022-08-27 NOTE — Telephone Encounter (Signed)
   Telephone encounter was:  Unsuccessful.  08/27/2022 Name: Albert Powell MRN: [id number] DOB: 11-Jul-1941  Unsuccessful outbound call made today to assist with:  Transportation Needs   Outreach Attempt:  1st Attempt  A HIPAA compliant voice message was left requesting a return call.  Instructed patient to call back at [phone]. Yehuda Mao Greenauer -[facility] [place], Population Health [phone] [address] [place] , [place] [address] Email : [email or web address]

## 2022-08-28 ENCOUNTER — Other Ambulatory Visit: Payer: Self-pay

## 2022-08-28 ENCOUNTER — Encounter (HOSPITAL_COMMUNITY): Payer: Self-pay | Admitting: Emergency Medicine

## 2022-08-28 ENCOUNTER — Telehealth (HOSPITAL_COMMUNITY): Payer: Self-pay | Admitting: Emergency Medicine

## 2022-08-28 ENCOUNTER — Emergency Department (HOSPITAL_COMMUNITY)
Admission: EM | Admit: 2022-08-28 | Discharge: 2022-08-29 | Disposition: A | Discharge status: Home / Self Care | Payer: Medicare Other | Attending: Emergency Medicine | Admitting: Emergency Medicine

## 2022-08-28 ENCOUNTER — Other Ambulatory Visit (HOSPITAL_COMMUNITY): Payer: Self-pay | Admitting: Emergency Medicine

## 2022-08-28 ENCOUNTER — Telehealth: Payer: Self-pay | Admitting: *Deleted

## 2022-08-28 DIAGNOSIS — E876 Hypokalemia: Secondary | ICD-10-CM | POA: Insufficient documentation

## 2022-08-28 DIAGNOSIS — R110 Nausea: Secondary | ICD-10-CM | POA: Diagnosis not present

## 2022-08-28 DIAGNOSIS — Z7902 Long term (current) use of antithrombotics/antiplatelets: Secondary | ICD-10-CM | POA: Insufficient documentation

## 2022-08-28 DIAGNOSIS — R42 Dizziness and giddiness: Secondary | ICD-10-CM | POA: Diagnosis not present

## 2022-08-28 DIAGNOSIS — N189 Chronic kidney disease, unspecified: Secondary | ICD-10-CM | POA: Diagnosis not present

## 2022-08-28 DIAGNOSIS — R001 Bradycardia, unspecified: Secondary | ICD-10-CM | POA: Diagnosis not present

## 2022-08-28 DIAGNOSIS — Z1152 Encounter for screening for COVID-19: Secondary | ICD-10-CM | POA: Insufficient documentation

## 2022-08-28 DIAGNOSIS — I4430 Unspecified atrioventricular block: Secondary | ICD-10-CM | POA: Diagnosis not present

## 2022-08-28 DIAGNOSIS — R531 Weakness: Secondary | ICD-10-CM | POA: Diagnosis not present

## 2022-08-28 DIAGNOSIS — Z7901 Long term (current) use of anticoagulants: Secondary | ICD-10-CM | POA: Insufficient documentation

## 2022-08-28 DIAGNOSIS — I440 Atrioventricular block, first degree: Secondary | ICD-10-CM | POA: Diagnosis not present

## 2022-08-28 LAB — URINALYSIS, ROUTINE W REFLEX MICROSCOPIC
Bacteria, UA: NONE SEEN
Bilirubin Urine: NEGATIVE
Glucose, UA: NEGATIVE mg/dL
Hgb urine dipstick: NEGATIVE
Ketones, ur: NEGATIVE mg/dL
Leukocytes,Ua: NEGATIVE
Nitrite: NEGATIVE
Protein, ur: 100 mg/dL — AB
Specific Gravity, Urine: 1.008 (ref 1.005–1.030)
pH: 5 (ref 5.0–8.0)

## 2022-08-28 LAB — CBC WITH DIFFERENTIAL/PLATELET
Abs Immature Granulocytes: 0.12 10*3/uL — ABNORMAL HIGH (ref 0.00–0.07)
Basophils Absolute: 0 10*3/uL (ref 0.0–0.1)
Basophils Relative: 0 %
Eosinophils Absolute: 0 10*3/uL (ref 0.0–0.5)
Eosinophils Relative: 0 %
HCT: 41.4 % (ref 39.0–52.0)
Hemoglobin: 14.8 g/dL (ref 13.0–17.0)
Immature Granulocytes: 1 %
Lymphocytes Relative: 10 %
Lymphs Abs: 0.9 10*3/uL (ref 0.7–4.0)
MCH: 30.2 pg (ref 26.0–34.0)
MCHC: 35.7 g/dL (ref 30.0–36.0)
MCV: 84.5 fL (ref 80.0–100.0)
Monocytes Absolute: 0.6 10*3/uL (ref 0.1–1.0)
Monocytes Relative: 6 %
Neutro Abs: 7.5 10*3/uL (ref 1.7–7.7)
Neutrophils Relative %: 83 %
Platelets: 256 10*3/uL (ref 150–400)
RBC: 4.9 MIL/uL (ref 4.22–5.81)
RDW: 13.7 % (ref 11.5–15.5)
WBC: 9.1 10*3/uL (ref 4.0–10.5)
nRBC: 0 % (ref 0.0–0.2)

## 2022-08-28 LAB — COMPREHENSIVE METABOLIC PANEL
ALT: 22 U/L (ref 0–44)
AST: 17 U/L (ref 15–41)
Albumin: 3.4 g/dL — ABNORMAL LOW (ref 3.5–5.0)
Alkaline Phosphatase: 73 U/L (ref 38–126)
Anion gap: 14 (ref 5–15)
BUN: 28 mg/dL — ABNORMAL HIGH (ref 8–23)
CO2: 26 mmol/L (ref 22–32)
Calcium: 9.1 mg/dL (ref 8.9–10.3)
Chloride: 93 mmol/L — ABNORMAL LOW (ref 98–111)
Creatinine, Ser: 2.29 mg/dL — ABNORMAL HIGH (ref 0.61–1.24)
GFR, Estimated: 28 mL/min — ABNORMAL LOW (ref >60)
Glucose, Bld: 159 mg/dL — ABNORMAL HIGH (ref 70–99)
Potassium: 3 mmol/L — ABNORMAL LOW (ref 3.5–5.1)
Sodium: 133 mmol/L — ABNORMAL LOW (ref 135–145)
Total Bilirubin: 0.8 mg/dL (ref 0.3–1.2)
Total Protein: 6.5 g/dL (ref 6.5–8.1)

## 2022-08-28 LAB — TROPONIN I (HIGH SENSITIVITY)
Troponin I (High Sensitivity): 24 ng/L — ABNORMAL HIGH (ref <18)
Troponin I (High Sensitivity): 20 ng/L — ABNORMAL HIGH (ref <18)

## 2022-08-28 LAB — LIPASE, BLOOD: Lipase: 34 U/L (ref 11–51)

## 2022-08-28 LAB — RESP PANEL BY RT-PCR (RSV, FLU A&B, COVID)  RVPGX2
Influenza A by PCR: NEGATIVE
Influenza B by PCR: NEGATIVE
Resp Syncytial Virus by PCR: NEGATIVE
SARS Coronavirus 2 by RT PCR: NEGATIVE

## 2022-08-28 MED ORDER — ONDANSETRON 4 MG PO TBDP: 4.0000 mg | ORAL_TABLET | Freq: Three times a day (TID) | ORAL | 0 refills | Status: DC | PRN

## 2022-08-28 MED ORDER — SODIUM CHLORIDE 0.9 % IV BOLUS
1000.0000 mL | Freq: Once | INTRAVENOUS | Status: AC
  Administered 2022-08-28: 1000 mL via INTRAVENOUS

## 2022-08-28 MED ORDER — POTASSIUM CHLORIDE ER 10 MEQ PO TBCR: 10.0000 meq | EXTENDED_RELEASE_TABLET | Freq: Every day | ORAL | 0 refills | Status: DC

## 2022-08-28 NOTE — ED Triage Notes (Signed)
Pt BIB [facility] from home due to generalized weakness that has gotten worse over the day.  Hx CHF.  VS BP sitting 116/82, standing 94/50 HR 40's CBG 176.  4mg  of Zofran given en route.  20g left AC.

## 2022-08-28 NOTE — Telephone Encounter (Signed)
Reached out to Mrs. Frisbee to inquire as to whether she wishes to accompany Mr. Berisha to his 12/22 clinic appointment she she advised that she does wish to go.  While speaking with her she stated that Mr. Ishmael was complaining of being nauseated and had vomited and had dry heaves.  I contacted 911 and had them send a unit out to evaluate him as I could not get there.  It was determined that he has some positive orthostatic changes w/ his BP and he was transported to [facility] for further evaluation.    Beatrix Shipper, EMT-Paramedic [phone] 08/28/2022

## 2022-08-28 NOTE — Progress Notes (Signed)
Paramedicine Encounter    Patient ID: Albert Powell, male    DOB: September 05, 1941, 81 y.o.   MRN: [id number]   BP 138/68 (BP Location: Right Arm, Patient Position: Sitting, Cuff Size: Normal)   Pulse 80   Resp 16   Wt 202 lb 3.2 oz (91.7 kg)   SpO2 95%   BMI 29.86 kg/m  Weight yesterday-not taken Last visit weight-198lb  ATF Albert Powell still in the bed on my arrival today.  He got up shortly after I arrived.  Pt has not done well with his med compliance missing about 1/4 of his doses over the past week.  Med box reconciled x 1 week.  He denies chest pain or SOB.  His weight is up 4lbs from last visit but he shows no signs of edema to his lower extremities.  Lung sounds clear and equal bilat.  He at some toast and jelly while I was completing his visit and took his morning meds. Attempted Cardi[facility] reading and could not get signal.  It is frequently difficult to get signal and I will reach out to try and determine what might be going wrong.   I reached out to Albert Powell and spoke with Albert Powell about transportation for [facility] visit 12/22 @12 :00  She e-mailed me a TAMS app and I filled it out and hand delivered it to [address] and got his transportation set up.  Visit complete.    [address], EMT-Paramedic [phone] 08/28/2022  Patient Care Team: Patient, No Pcp Per as PCP - General (General Practice) 08/30/2022, MD as PCP - Cardiology (Cardiology) [name], MD as Consulting Physician (Cardiology) [name], NP as [facility] Care Management (Geriatric Medicine)  Patient Active Problem List   Diagnosis Date Noted   Hypokalemia 06/23/2022   Elevated troponin 06/23/2022   Type 2 diabetes mellitus with hyperlipidemia ([name]) 05/19/2022   Acute combined systolic and diastolic heart failure ([name])    Acute kidney injury superimposed on chronic kidney disease ([name]) 05/13/2022   GERD (gastroesophageal reflux disease)  05/13/2022   Acquired hypothyroidism 05/13/2022   Acute on chronic systolic CHF (congestive heart failure) ([name]) 11/23/2021   PAF (paroxysmal atrial fibrillation) ([name]) 10/08/2021   Acute on chronic diastolic heart failure ([name]) 06/07/2021   Acute metabolic encephalopathy    CHF (congestive heart failure) ([name]) 03/10/2021   Uncontrolled hypertension 03/10/2021   Hypertensive crisis 01/01/2021   ACS (acute coronary syndrome) ([name])    AKI (acute kidney injury) ([name])    Atrial fibrillation ([name]) 03/30/2020   Contusion of right hand 11/12/2018   Primary osteoarthritis of both first carpometacarpal joints 11/12/2018   Pain in joint of right shoulder 10/05/2018   Pain in right hand 10/05/2018   Hematoma 03/30/2018   Degeneration of lumbar intervertebral disc 12/21/2017   Lumbar radiculopathy 12/21/2017   Vertigo 04/09/2017   Hypogonadism in male 12/07/2014   Screening for prostate cancer 12/07/2014   Claudication of right lower extremity ([name]) 10/20/2013   Claudication in peripheral vascular disease- Rt leg 09/20/2013   Obesity (BMI 30-39.9)- negative sleep study in the past 09/20/2013   Occlusion and stenosis of carotid artery without mention of cerebral infarction 05/23/2013   PAD (peripheral artery disease) ([name]) 05/18/2013   Carotid artery disease ([name]) 04/14/2013   Bradycardia, sinus 02/18/2013   Tobacco abuse 02/05/2013   Chest pain, unstable angina, negative MI 02/04/2013   Type 2 diabetes mellitus with stage 3b chronic kidney disease ([name]) 02/04/2013  Coronary artery disease involving native coronary artery of native heart without angina pectoris 02/04/2013   Benign essential hypertension 07/10/2011   Incisional hernia 05/22/2011    Current Outpatient Medications:    amiodarone (PACERONE) 200 MG tablet, Take 1 tablet (200 mg total) by mouth 2 (two) times daily., Disp: 60 tablet, Rfl: 3   amLODipine (NORVASC) 10 MG tablet, Take 1 tablet (10 mg total) by mouth daily., Disp: 30  tablet, Rfl: 11   apixaban (ELIQUIS) 2.5 MG TABS tablet, Take 1 tablet (2.5 mg total) by mouth 2 (two) times daily., Disp: 60 tablet, Rfl: 3   atorvastatin (LIPITOR) 40 MG tablet, Take 1 tablet (40 mg total) by mouth daily., Disp: 30 tablet, Rfl: 11   carvedilol (COREG) 25 MG tablet, Take 1 tablet (25 mg total) by mouth 2 (two) times daily with a meal., Disp: 60 tablet, Rfl: 3   clopidogrel (PLAVIX) 75 MG tablet, Take 1 tablet (75 mg total) by mouth daily., Disp: 30 tablet, Rfl: 11   hydrALAZINE (APRESOLINE) 100 MG tablet, Take 1 tablet (100 mg total) by mouth 3 (three) times daily., Disp: 90 tablet, Rfl: 0   isosorbide mononitrate (IMDUR) 120 MG 24 hr tablet, Take 1 tablet (120 mg total) by mouth daily., Disp: 30 tablet, Rfl: 11   pantoprazole (PROTONIX) 40 MG tablet, Take 1 tablet (40 mg total) by mouth every other day., Disp: 30 tablet, Rfl: 3   spironolactone (ALDACTONE) 25 MG tablet, Take 1 tablet (25 mg total) by mouth daily., Disp: 30 tablet, Rfl: 3   glipiZIDE (GLUCOTROL) 5 MG tablet, Take 5 mg by mouth daily before breakfast. (Patient not taking: Reported on 08/06/2022), Disp: , Rfl:    levothyroxine (SYNTHROID) 25 MCG tablet, TAKE ONE TABLET BY MOUTH DAILY BEFORE BREAKFAST (Patient not taking: Reported on 08/14/2022), Disp: 90 tablet, Rfl: 3   torsemide (DEMADEX) 20 MG tablet, Take 2 tablets (40 mg total) by mouth daily., Disp: 120 tablet, Rfl: 4 Allergies  Allergen Reactions   Fentanyl Other (See Comments)    Behavioral changes   Gabapentin Swelling   Lisinopril Other (See Comments)    unknown   Lyrica [Pregabalin] Other (See Comments)    unknown   Metformin Diarrhea   Propofol Other (See Comments)    Heart rate dropped      Social History   Socioeconomic History   Marital status: Married    Spouse name: Adith Tejada   Number of children: 1   Years of education: Not on file   Highest education level: High school graduate  Occupational History   Occupation: retired   Tobacco Use   Smoking status: Former    Packs/day: 1.50    Years: 57.00    Total pack years: 85.50    Types: Cigarettes, E-cigarettes    Quit date: 2020    Years since quitting: 3.9   Smokeless tobacco: Never   Tobacco comments:    pt states that he is using the vapor cigs--12/2021  Vaping Use   Vaping Use: Former   Start date: 09/15/2016   Substances: Nicotine  Substance and Sexual Activity   Alcohol use: Yes    Alcohol/week: 1.0 standard drink of alcohol    Types: 1 Glasses of wine per week    Comment: "very little"   Drug use: No   Sexual activity: Not on file  Other Topics Concern   Not on file  Social History Narrative   Not on file   Social Determinants of Health  Financial Resource Strain: Low Risk  (06/03/2022)   Overall Financial Resource Strain (CARDIA)    Difficulty of Paying Living Expenses: Not hard at all  Food Insecurity: No Food Insecurity (08/14/2022)   Hunger Vital Sign    Worried About Running Out of Food in the Last Year: Never true    Ran Out of Food in the Last Year: Never true  Transportation Needs: Unmet Transportation Needs (08/21/2022)   PRAPARE - Administrator, Civil Service (Medical): Yes    Lack of Transportation (Non-Medical): No  Physical Activity: Not on file  Stress: Not on file  Social Connections: Not on file  Intimate Partner Violence: Not At Risk (08/14/2022)   Humiliation, Afraid, Rape, and Kick questionnaire    Fear of Current or Ex-Partner: No    Emotionally Abused: No    Physically Abused: No    Sexually Abused: No    Physical Exam      Future Appointments  Date Time Provider Department Center  09/05/2022 12:00 PM [facility] PA/NP [facility] None       Beatrix Shipper, EMT-P-Paramedic [phone] [facility] Paramedic  08/28/22

## 2022-08-28 NOTE — ED Provider Notes (Signed)
[facility] EMERGENCY DEPARTMENT Provider Note   CSN: [id number] Arrival date & time: 08/28/22  1732     History  Chief Complaint  Patient presents with   Weakness    Albert Powell is a 81 y.o. male presenting to the ED with generalized weakness.  He comes from home.  He reports he has had weakness but predominantly is concerned about nausea.  This did have some mild orthostatic hypotension for EMS, heart rate noted to be in the 40s.  He was given Zofran prior to arrival.  Patient denies any chest pain, fevers, chills, cough, congestion, constipation or diarrhea.  Denies history of abdominal surgery  HPI     Home Medications Prior to Admission medications   Medication Sig Start Date End Date Taking? Authorizing Provider  ondansetron (ZOFRAN-ODT) 4 MG disintegrating tablet Take 1 tablet (4 mg total) by mouth every 8 (eight) hours as needed for up to 15 doses for nausea or vomiting. 08/28/22  Yes Anatole Apollo, Kermit Balo, MD  potassium chloride (KLOR-CON) 10 MEQ tablet Take 1 tablet (10 mEq total) by mouth daily for 20 days. 08/28/22 09/17/22 Yes Aimie Wagman, Kermit Balo, MD  amiodarone (PACERONE) 200 MG tablet Take 1 tablet (200 mg total) by mouth 2 (two) times daily. 08/05/22   Laurey Morale, MD  amLODipine (NORVASC) 10 MG tablet Take 1 tablet (10 mg total) by mouth daily. 08/05/22 08/05/23  Laurey Morale, MD  apixaban (ELIQUIS) 2.5 MG TABS tablet Take 1 tablet (2.5 mg total) by mouth 2 (two) times daily. 08/05/22   Laurey Morale, MD  atorvastatin (LIPITOR) 40 MG tablet Take 1 tablet (40 mg total) by mouth daily. 08/15/22   Laurey Morale, MD  carvedilol (COREG) 25 MG tablet Take 1 tablet (25 mg total) by mouth 2 (two) times daily with a meal. 08/05/22   Laurey Morale, MD  clopidogrel (PLAVIX) 75 MG tablet Take 1 tablet (75 mg total) by mouth daily. 08/15/22   Laurey Morale, MD  glipiZIDE (GLUCOTROL) 5 MG tablet Take 5 mg by mouth daily before breakfast. Patient  not taking: Reported on 08/06/2022    [provider]  hydrALAZINE (APRESOLINE) 100 MG tablet Take 1 tablet (100 mg total) by mouth 3 (three) times daily. 08/05/22 09/04/22  Laurey Morale, MD  isosorbide mononitrate (IMDUR) 120 MG 24 hr tablet Take 1 tablet (120 mg total) by mouth daily. 08/15/22   Laurey Morale, MD  levothyroxine (SYNTHROID) 25 MCG tablet TAKE ONE TABLET BY MOUTH DAILY BEFORE BREAKFAST Patient not taking: Reported on 08/14/2022 03/13/22   [name], MD  pantoprazole (PROTONIX) 40 MG tablet Take 1 tablet (40 mg total) by mouth every other day. 07/30/22   Allayne Butcher, PA-C  spironolactone (ALDACTONE) 25 MG tablet Take 1 tablet (25 mg total) by mouth daily. 08/05/22   Laurey Morale, MD  torsemide (DEMADEX) 20 MG tablet Take 2 tablets (40 mg total) by mouth daily. 08/05/22   Laurey Morale, MD      Allergies    Fentanyl, Gabapentin, Lisinopril, Lyrica [pregabalin], Metformin, and Propofol    Review of Systems   Review of Systems  Physical Exam Updated Vital Signs BP (!) 167/83   Pulse 68   Temp 98.5 F (36.9 C) (Oral)   Resp 18   Ht 5\' 9"  (1.753 m)   Wt 99.8 kg   SpO2 100%   BMI 32.49 kg/m  Physical Exam Constitutional:  General: He is not in acute distress. HENT:     Head: Normocephalic and atraumatic.  Eyes:     Conjunctiva/sclera: Conjunctivae normal.     Pupils: Pupils are equal, round, and reactive to light.  Cardiovascular:     Rate and Rhythm: Regular rhythm. Bradycardia present.  Pulmonary:     Effort: Pulmonary effort is normal. No respiratory distress.  Abdominal:     General: There is no distension.     Tenderness: There is no abdominal tenderness.  Skin:    General: Skin is warm and dry.  Neurological:     General: No focal deficit present.     Mental Status: He is alert. Mental status is at baseline.  Psychiatric:        Mood and Affect: Mood normal.        Behavior: Behavior normal.     ED Results  / Procedures / Treatments   Labs (all labs ordered are listed, but only abnormal results are displayed) Labs Reviewed  COMPREHENSIVE METABOLIC PANEL - Abnormal; Notable for the following components:      Result Value   Sodium 133 (*)    Potassium 3.0 (*)    Chloride 93 (*)    Glucose, Bld 159 (*)    BUN 28 (*)    Creatinine, Ser 2.29 (*)    Albumin 3.4 (*)    GFR, Estimated 28 (*)    All other components within normal limits  CBC WITH DIFFERENTIAL/PLATELET - Abnormal; Notable for the following components:   Abs Immature Granulocytes 0.12 (*)    All other components within normal limits  URINALYSIS, ROUTINE W REFLEX MICROSCOPIC - Abnormal; Notable for the following components:   Protein, ur 100 (*)    All other components within normal limits  TROPONIN I (HIGH SENSITIVITY) - Abnormal; Notable for the following components:   Troponin I (High Sensitivity) 24 (*)    All other components within normal limits  TROPONIN I (HIGH SENSITIVITY) - Abnormal; Notable for the following components:   Troponin I (High Sensitivity) 20 (*)    All other components within normal limits  RESP PANEL BY RT-PCR (RSV, FLU A&B, COVID)  RVPGX2  LIPASE, BLOOD    EKG None  Radiology No results found.  Procedures Procedures    Medications Ordered in ED Medications  sodium chloride 0.9 % bolus 1,000 mL (0 mLs Intravenous Stopped 08/28/22 2057)    ED Course/ Medical Decision Making/ A&P Clinical Course as of 08/28/22 2315  Thu Aug 28, 2022  2234 Patient's labs overall are at baseline levels.  He has some mild hypokalemia and will be started on oral potassium at home.  Otherwise do not see evidence of COVID, sepsis, anemia, UTI, or AKI.  His troponin is always mildly elevated but I doubt this is ACS.  He is wanting to go home and I think this is reasonable. [MT]    Clinical Course User Index [MT] Diva Lemberger, Kermit Balo, MD                           Medical Decision Making Amount and/or Complexity of  Data Reviewed Labs: ordered. ECG/medicine tests: ordered.  Risk Prescription drug management.   This patient presents to the ED with concern for nausea. This involves an extensive number of treatment options, and is a complaint that carries with it a high risk of complications and morbidity.  The differential diagnosis includes viral illness versus atypical ACS versus  gastritis versus other  Additional history obtained from EMS  I ordered and personally interpreted labs.  The pertinent results include: UA without sign of infection.  Mild hypokalemia.  Chronic kidney disease creatinine 2.2.  No leukocytosis or anemia.  Troponin at baseline level 20.  Lipase within normal limits.  No transaminitis.  The patient had no abdominal tenderness or pain to suggest an acute intra-abdominal infection or inflammatory process and warrant emergent CT imaging of the abdomen at this time.  The patient was maintained on a cardiac monitor.  I personally viewed and interpreted the cardiac monitored which showed an underlying rhythm of: Sinus bradycardia and sinus rhythm  Per my interpretation the patient's ECG shows no acute ischemia  I ordered medication including IV fluids for hydration.  He had received Zofran by EMS prior to arrival.  I have reviewed the patients home medicines and have made adjustments as needed  After the interventions noted above, I reevaluated the patient and found that they have: improved   Dispostion:  After consideration of the diagnostic results and the patients response to treatment, I feel that the patent would benefit from outpatient PCP f/u.  Overall I have not identified any emergent medical concern, including ACS, bowel obstruction, PE, sepsis, or other life-threatening condition, as a cause of the patient's nausea.  This been ongoing for several days.  I do recommend he follows up with a PCP and I prescribed him Zofran as well as potassium to take at home.  The patient  verbalized understanding is very much wanting to go home.  We will try to reach out to his family to see if we can get him a ride home safely.         Final Clinical Impression(s) / ED Diagnoses Final diagnoses:  Nausea  Hypokalemia    Rx / DC Orders ED Discharge Orders          Ordered    ondansetron (ZOFRAN-ODT) 4 MG disintegrating tablet  Every 8 hours PRN        08/28/22 2238    potassium chloride (KLOR-CON) 10 MEQ tablet  Daily        08/28/22 2238              Terald Sleeper, MD 08/28/22 2315

## 2022-08-28 NOTE — Telephone Encounter (Signed)
   Telephone encounter was:  Successful.  08/28/2022 Name: JACARIUS HANDEL MRN: [id number] DOB: 1941-01-08  MARC LEICHTER is a 81 y.o. year old male who is a primary care patient of Patient, No Pcp Per . The community resource team was consulted for assistance with Transportation Needs  Contacted by DeDe Katrinka Blazing from [facility] to arrange transportation , will book with thn transportation for 09/05/2022 appt at the clinic , emailed Ms. Smith the Tams application for future appts .  Care guide performed the following interventions: Patient provided with information about care guide support team and interviewed to confirm resource needs.  Follow Up Plan:  Client will call back to set up appt  Savien Mamula Greenauer -[facility], Population Health [phone] [address] [place] , [place] [address] Email : [email or web address]

## 2022-08-28 NOTE — Discharge Instructions (Addendum)
I prescribed nausea medicine called Zofran as well as potassium pills because your potassium level is low.  Please make an effort to drink water.  Call your primary care doctor's office tomorrow to ask her follow-up appointment for your nausea.  Your doctor should recheck your potassium levels this week and see how you are feeling.

## 2022-08-29 ENCOUNTER — Telehealth: Payer: Self-pay | Admitting: *Deleted

## 2022-08-29 DIAGNOSIS — R531 Weakness: Secondary | ICD-10-CM | POA: Diagnosis not present

## 2022-08-29 DIAGNOSIS — Z7401 Bed confinement status: Secondary | ICD-10-CM | POA: Diagnosis not present

## 2022-08-29 NOTE — Telephone Encounter (Signed)
   Telephone encounter was:  Successful.  08/29/2022 Name: Albert Powell MRN: [id number] DOB: 1941/02/24  ROC STREETT is a 81 y.o. year old male who is a primary care patient of Patient, No Pcp Per . The community resource team was consulted for assistance with Transportation Needs   Care guide performed the following interventions: Patient provided with information about care guide support team and interviewed to confirm resource needs.  Assisted by the EMT D.Smith assigned to him through the practice,patient now has community transportation   Follow Up Plan:  He now has transportation scheduled 12/22 for his 12:00 appointment.  He is to be ready between 10:10 -10:50 with [facility] and his appointments will needd to be booked for him   Alois Cliche -[facility] [place], Population Health [phone] [address] [place] , [place] [address] Email : [email or web address]

## 2022-08-29 NOTE — ED Notes (Signed)
AVS reviewed with pt prior to discharge. Pt verbalizes understanding. Belongings with pt upon depart. Pt taken home via PTAR.

## 2022-09-01 DIAGNOSIS — I130 Hypertensive heart and chronic kidney disease with heart failure and stage 1 through stage 4 chronic kidney disease, or unspecified chronic kidney disease: Secondary | ICD-10-CM | POA: Diagnosis not present

## 2022-09-01 DIAGNOSIS — I082 Rheumatic disorders of both aortic and tricuspid valves: Secondary | ICD-10-CM | POA: Diagnosis not present

## 2022-09-01 DIAGNOSIS — I4819 Other persistent atrial fibrillation: Secondary | ICD-10-CM | POA: Diagnosis not present

## 2022-09-01 DIAGNOSIS — I5043 Acute on chronic combined systolic (congestive) and diastolic (congestive) heart failure: Secondary | ICD-10-CM | POA: Diagnosis not present

## 2022-09-01 DIAGNOSIS — N1832 Chronic kidney disease, stage 3b: Secondary | ICD-10-CM | POA: Diagnosis not present

## 2022-09-01 DIAGNOSIS — E1122 Type 2 diabetes mellitus with diabetic chronic kidney disease: Secondary | ICD-10-CM | POA: Diagnosis not present

## 2022-09-04 ENCOUNTER — Inpatient Hospital Stay (HOSPITAL_COMMUNITY)
Admission: EM | Admit: 2022-09-04 | Discharge: 2022-09-07 | DRG: 683 | Disposition: A | Discharge status: Home / Self Care | Payer: Medicare Other | Attending: Internal Medicine | Admitting: Internal Medicine

## 2022-09-04 ENCOUNTER — Emergency Department (HOSPITAL_COMMUNITY): Payer: Medicare Other

## 2022-09-04 ENCOUNTER — Other Ambulatory Visit: Payer: Self-pay

## 2022-09-04 ENCOUNTER — Other Ambulatory Visit (HOSPITAL_COMMUNITY): Payer: Self-pay | Admitting: Emergency Medicine

## 2022-09-04 DIAGNOSIS — F0390 Unspecified dementia without behavioral disturbance: Secondary | ICD-10-CM | POA: Diagnosis present

## 2022-09-04 DIAGNOSIS — J449 Chronic obstructive pulmonary disease, unspecified: Secondary | ICD-10-CM | POA: Diagnosis present

## 2022-09-04 DIAGNOSIS — I480 Paroxysmal atrial fibrillation: Secondary | ICD-10-CM | POA: Diagnosis present

## 2022-09-04 DIAGNOSIS — N179 Acute kidney failure, unspecified: Principal | ICD-10-CM | POA: Diagnosis present

## 2022-09-04 DIAGNOSIS — E669 Obesity, unspecified: Secondary | ICD-10-CM | POA: Diagnosis present

## 2022-09-04 DIAGNOSIS — E785 Hyperlipidemia, unspecified: Secondary | ICD-10-CM | POA: Diagnosis present

## 2022-09-04 DIAGNOSIS — E872 Acidosis, unspecified: Secondary | ICD-10-CM | POA: Diagnosis present

## 2022-09-04 DIAGNOSIS — Z83438 Family history of other disorder of lipoprotein metabolism and other lipidemia: Secondary | ICD-10-CM

## 2022-09-04 DIAGNOSIS — E1169 Type 2 diabetes mellitus with other specified complication: Secondary | ICD-10-CM | POA: Diagnosis not present

## 2022-09-04 DIAGNOSIS — Z7902 Long term (current) use of antithrombotics/antiplatelets: Secondary | ICD-10-CM

## 2022-09-04 DIAGNOSIS — I152 Hypertension secondary to endocrine disorders: Secondary | ICD-10-CM | POA: Diagnosis not present

## 2022-09-04 DIAGNOSIS — K219 Gastro-esophageal reflux disease without esophagitis: Secondary | ICD-10-CM | POA: Diagnosis present

## 2022-09-04 DIAGNOSIS — I130 Hypertensive heart and chronic kidney disease with heart failure and stage 1 through stage 4 chronic kidney disease, or unspecified chronic kidney disease: Secondary | ICD-10-CM | POA: Diagnosis present

## 2022-09-04 DIAGNOSIS — R001 Bradycardia, unspecified: Secondary | ICD-10-CM | POA: Diagnosis present

## 2022-09-04 DIAGNOSIS — G9389 Other specified disorders of brain: Secondary | ICD-10-CM | POA: Diagnosis not present

## 2022-09-04 DIAGNOSIS — E1159 Type 2 diabetes mellitus with other circulatory complications: Secondary | ICD-10-CM | POA: Diagnosis not present

## 2022-09-04 DIAGNOSIS — Z825 Family history of asthma and other chronic lower respiratory diseases: Secondary | ICD-10-CM

## 2022-09-04 DIAGNOSIS — Z7984 Long term (current) use of oral hypoglycemic drugs: Secondary | ICD-10-CM

## 2022-09-04 DIAGNOSIS — I5043 Acute on chronic combined systolic (congestive) and diastolic (congestive) heart failure: Secondary | ICD-10-CM

## 2022-09-04 DIAGNOSIS — I10 Essential (primary) hypertension: Secondary | ICD-10-CM | POA: Diagnosis not present

## 2022-09-04 DIAGNOSIS — E860 Dehydration: Secondary | ICD-10-CM | POA: Diagnosis present

## 2022-09-04 DIAGNOSIS — Z79890 Hormone replacement therapy (postmenopausal): Secondary | ICD-10-CM

## 2022-09-04 DIAGNOSIS — R0789 Other chest pain: Secondary | ICD-10-CM | POA: Diagnosis not present

## 2022-09-04 DIAGNOSIS — R55 Syncope and collapse: Secondary | ICD-10-CM | POA: Diagnosis present

## 2022-09-04 DIAGNOSIS — I2510 Atherosclerotic heart disease of native coronary artery without angina pectoris: Secondary | ICD-10-CM | POA: Diagnosis present

## 2022-09-04 DIAGNOSIS — E871 Hypo-osmolality and hyponatremia: Secondary | ICD-10-CM | POA: Diagnosis present

## 2022-09-04 DIAGNOSIS — K439 Ventral hernia without obstruction or gangrene: Secondary | ICD-10-CM | POA: Diagnosis not present

## 2022-09-04 DIAGNOSIS — Z79899 Other long term (current) drug therapy: Secondary | ICD-10-CM | POA: Diagnosis not present

## 2022-09-04 DIAGNOSIS — E1122 Type 2 diabetes mellitus with diabetic chronic kidney disease: Secondary | ICD-10-CM | POA: Diagnosis not present

## 2022-09-04 DIAGNOSIS — I701 Atherosclerosis of renal artery: Secondary | ICD-10-CM | POA: Diagnosis present

## 2022-09-04 DIAGNOSIS — R079 Chest pain, unspecified: Principal | ICD-10-CM

## 2022-09-04 DIAGNOSIS — Z7901 Long term (current) use of anticoagulants: Secondary | ICD-10-CM

## 2022-09-04 DIAGNOSIS — E1151 Type 2 diabetes mellitus with diabetic peripheral angiopathy without gangrene: Secondary | ICD-10-CM | POA: Diagnosis present

## 2022-09-04 DIAGNOSIS — E861 Hypovolemia: Secondary | ICD-10-CM | POA: Diagnosis present

## 2022-09-04 DIAGNOSIS — Z87891 Personal history of nicotine dependence: Secondary | ICD-10-CM

## 2022-09-04 DIAGNOSIS — I5042 Chronic combined systolic (congestive) and diastolic (congestive) heart failure: Secondary | ICD-10-CM | POA: Diagnosis present

## 2022-09-04 DIAGNOSIS — E876 Hypokalemia: Secondary | ICD-10-CM | POA: Diagnosis not present

## 2022-09-04 DIAGNOSIS — Z888 Allergy status to other drugs, medicaments and biological substances status: Secondary | ICD-10-CM

## 2022-09-04 DIAGNOSIS — N1832 Chronic kidney disease, stage 3b: Secondary | ICD-10-CM | POA: Diagnosis present

## 2022-09-04 DIAGNOSIS — N2889 Other specified disorders of kidney and ureter: Secondary | ICD-10-CM | POA: Diagnosis not present

## 2022-09-04 DIAGNOSIS — Z83719 Family history of colon polyps, unspecified: Secondary | ICD-10-CM

## 2022-09-04 DIAGNOSIS — Z6830 Body mass index (BMI) 30.0-30.9, adult: Secondary | ICD-10-CM

## 2022-09-04 DIAGNOSIS — Z8249 Family history of ischemic heart disease and other diseases of the circulatory system: Secondary | ICD-10-CM

## 2022-09-04 DIAGNOSIS — I6523 Occlusion and stenosis of bilateral carotid arteries: Secondary | ICD-10-CM | POA: Diagnosis not present

## 2022-09-04 DIAGNOSIS — K449 Diaphragmatic hernia without obstruction or gangrene: Secondary | ICD-10-CM | POA: Diagnosis not present

## 2022-09-04 DIAGNOSIS — N189 Chronic kidney disease, unspecified: Secondary | ICD-10-CM

## 2022-09-04 DIAGNOSIS — E039 Hypothyroidism, unspecified: Secondary | ICD-10-CM | POA: Diagnosis not present

## 2022-09-04 DIAGNOSIS — I440 Atrioventricular block, first degree: Secondary | ICD-10-CM | POA: Diagnosis present

## 2022-09-04 DIAGNOSIS — N289 Disorder of kidney and ureter, unspecified: Secondary | ICD-10-CM | POA: Diagnosis not present

## 2022-09-04 DIAGNOSIS — Z833 Family history of diabetes mellitus: Secondary | ICD-10-CM

## 2022-09-04 DIAGNOSIS — S0990XA Unspecified injury of head, initial encounter: Secondary | ICD-10-CM | POA: Diagnosis not present

## 2022-09-04 DIAGNOSIS — R531 Weakness: Secondary | ICD-10-CM | POA: Diagnosis not present

## 2022-09-04 DIAGNOSIS — G319 Degenerative disease of nervous system, unspecified: Secondary | ICD-10-CM | POA: Diagnosis not present

## 2022-09-04 DIAGNOSIS — R42 Dizziness and giddiness: Secondary | ICD-10-CM | POA: Diagnosis not present

## 2022-09-04 DIAGNOSIS — N261 Atrophy of kidney (terminal): Secondary | ICD-10-CM | POA: Diagnosis not present

## 2022-09-04 DIAGNOSIS — Z823 Family history of stroke: Secondary | ICD-10-CM

## 2022-09-04 LAB — URINALYSIS, ROUTINE W REFLEX MICROSCOPIC
Bacteria, UA: NONE SEEN
Bilirubin Urine: NEGATIVE
Glucose, UA: NEGATIVE mg/dL
Hgb urine dipstick: NEGATIVE
Ketones, ur: NEGATIVE mg/dL
Leukocytes,Ua: NEGATIVE
Nitrite: NEGATIVE
Protein, ur: 100 mg/dL — AB
Specific Gravity, Urine: 1.005 (ref 1.005–1.030)
pH: 6 (ref 5.0–8.0)

## 2022-09-04 LAB — BASIC METABOLIC PANEL WITH GFR
Anion gap: 14 (ref 5–15)
BUN: 25 mg/dL — ABNORMAL HIGH (ref 8–23)
CO2: 24 mmol/L (ref 22–32)
Calcium: 9 mg/dL (ref 8.9–10.3)
Chloride: 92 mmol/L — ABNORMAL LOW (ref 98–111)
Creatinine, Ser: 2.51 mg/dL — ABNORMAL HIGH (ref 0.61–1.24)
GFR, Estimated: 25 mL/min — ABNORMAL LOW
Glucose, Bld: 158 mg/dL — ABNORMAL HIGH (ref 70–99)
Potassium: 3.7 mmol/L (ref 3.5–5.1)
Sodium: 130 mmol/L — ABNORMAL LOW (ref 135–145)

## 2022-09-04 LAB — LACTIC ACID, PLASMA
Lactic Acid, Venous: 4.1 mmol/L (ref 0.5–1.9)
Lactic Acid, Venous: 1.6 mmol/L (ref 0.5–1.9)

## 2022-09-04 LAB — HEPATIC FUNCTION PANEL
ALT: 23 U/L (ref 0–44)
AST: 23 U/L (ref 15–41)
Albumin: 3.5 g/dL (ref 3.5–5.0)
Alkaline Phosphatase: 76 U/L (ref 38–126)
Bilirubin, Direct: 0.2 mg/dL (ref 0.0–0.2)
Indirect Bilirubin: 0.9 mg/dL (ref 0.3–0.9)
Total Bilirubin: 1.1 mg/dL (ref 0.3–1.2)
Total Protein: 6.2 g/dL — ABNORMAL LOW (ref 6.5–8.1)

## 2022-09-04 LAB — CBC
HCT: 41.7 % (ref 39.0–52.0)
Hemoglobin: 13.9 g/dL (ref 13.0–17.0)
MCH: 29.1 pg (ref 26.0–34.0)
MCHC: 33.3 g/dL (ref 30.0–36.0)
MCV: 87.2 fL (ref 80.0–100.0)
Platelets: 302 10*3/uL (ref 150–400)
RBC: 4.78 MIL/uL (ref 4.22–5.81)
RDW: 14.2 % (ref 11.5–15.5)
WBC: 12.3 10*3/uL — ABNORMAL HIGH (ref 4.0–10.5)
nRBC: 0 % (ref 0.0–0.2)

## 2022-09-04 LAB — BRAIN NATRIURETIC PEPTIDE: B Natriuretic Peptide: 226.5 pg/mL — ABNORMAL HIGH (ref 0.0–100.0)

## 2022-09-04 LAB — TROPONIN I (HIGH SENSITIVITY)
Troponin I (High Sensitivity): 26 ng/L — ABNORMAL HIGH (ref <18)
Troponin I (High Sensitivity): 21 ng/L — ABNORMAL HIGH (ref <18)

## 2022-09-04 LAB — CBG MONITORING, ED: Glucose-Capillary: 151 mg/dL — ABNORMAL HIGH (ref 70–99)

## 2022-09-04 MED ORDER — APIXABAN 2.5 MG PO TABS
2.5000 mg | ORAL_TABLET | Freq: Two times a day (BID) | ORAL | Status: DC
  Administered 2022-09-04 – 2022-09-07 (×6): 2.5 mg via ORAL
  Filled 2022-09-04 (×7): qty 1

## 2022-09-04 MED ORDER — SENNOSIDES-DOCUSATE SODIUM 8.6-50 MG PO TABS: 1.0000 | ORAL_TABLET | Freq: Every evening | ORAL | Status: DC | PRN

## 2022-09-04 MED ORDER — AMIODARONE HCL 200 MG PO TABS: 200.0000 mg | ORAL_TABLET | Freq: Two times a day (BID) | ORAL | Status: DC

## 2022-09-04 MED ORDER — HYDRALAZINE HCL 100 MG PO TABS: 100.0000 mg | ORAL_TABLET | Freq: Three times a day (TID) | ORAL | Status: DC

## 2022-09-04 MED ORDER — SODIUM CHLORIDE 0.9 % IV BOLUS
250.0000 mL | Freq: Once | INTRAVENOUS | Status: AC
  Administered 2022-09-04: 250 mL via INTRAVENOUS

## 2022-09-04 MED ORDER — CARVEDILOL 12.5 MG PO TABS: 25.0000 mg | ORAL_TABLET | Freq: Two times a day (BID) | ORAL | Status: DC

## 2022-09-04 MED ORDER — ONDANSETRON HCL 4 MG/2ML IJ SOLN
4.0000 mg | Freq: Four times a day (QID) | INTRAMUSCULAR | Status: DC | PRN
  Administered 2022-09-06: 4 mg via INTRAVENOUS
  Filled 2022-09-04 (×2): qty 2

## 2022-09-04 MED ORDER — SODIUM CHLORIDE 0.9% FLUSH
3.0000 mL | Freq: Two times a day (BID) | INTRAVENOUS | Status: DC
  Administered 2022-09-04 – 2022-09-07 (×6): 3 mL via INTRAVENOUS

## 2022-09-04 MED ORDER — AMLODIPINE BESYLATE 10 MG PO TABS
10.0000 mg | ORAL_TABLET | Freq: Every day | ORAL | Status: DC
  Administered 2022-09-05 – 2022-09-07 (×3): 10 mg via ORAL
  Filled 2022-09-04: qty 2
  Filled 2022-09-04 (×2): qty 1

## 2022-09-04 MED ORDER — INSULIN ASPART 100 UNIT/ML IJ SOLN
0.0000 [IU] | Freq: Three times a day (TID) | INTRAMUSCULAR | Status: DC
  Administered 2022-09-05 (×3): 1 [IU] via SUBCUTANEOUS
  Administered 2022-09-06: 2 [IU] via SUBCUTANEOUS
  Administered 2022-09-07: 1 [IU] via SUBCUTANEOUS

## 2022-09-04 MED ORDER — HYDRALAZINE HCL 50 MG PO TABS
100.0000 mg | ORAL_TABLET | Freq: Three times a day (TID) | ORAL | Status: DC
  Administered 2022-09-04 – 2022-09-07 (×9): 100 mg via ORAL
  Filled 2022-09-04: qty 2
  Filled 2022-09-04: qty 4
  Filled 2022-09-04 (×3): qty 2
  Filled 2022-09-04: qty 4
  Filled 2022-09-04: qty 2
  Filled 2022-09-04: qty 4
  Filled 2022-09-04: qty 2

## 2022-09-04 MED ORDER — CARVEDILOL 25 MG PO TABS
25.0000 mg | ORAL_TABLET | Freq: Two times a day (BID) | ORAL | Status: DC
  Administered 2022-09-04 – 2022-09-05 (×2): 25 mg via ORAL
  Filled 2022-09-04 (×2): qty 2

## 2022-09-04 MED ORDER — HYDRALAZINE HCL 25 MG PO TABS: 100.0000 mg | ORAL_TABLET | Freq: Three times a day (TID) | ORAL | Status: DC

## 2022-09-04 MED ORDER — ACETAMINOPHEN 325 MG PO TABS: 650.0000 mg | ORAL_TABLET | Freq: Four times a day (QID) | ORAL | Status: DC | PRN

## 2022-09-04 MED ORDER — SODIUM CHLORIDE 0.9 % IV SOLN: INTRAVENOUS | Status: AC

## 2022-09-04 MED ORDER — HYDRALAZINE HCL 20 MG/ML IJ SOLN: 10.0000 mg | INTRAMUSCULAR | Status: DC | PRN

## 2022-09-04 MED ORDER — ATORVASTATIN CALCIUM 40 MG PO TABS
40.0000 mg | ORAL_TABLET | Freq: Every day | ORAL | Status: DC
  Administered 2022-09-05 – 2022-09-07 (×3): 40 mg via ORAL
  Filled 2022-09-04 (×3): qty 1

## 2022-09-04 MED ORDER — ONDANSETRON HCL 4 MG PO TABS: 4.0000 mg | ORAL_TABLET | Freq: Four times a day (QID) | ORAL | Status: DC | PRN

## 2022-09-04 MED ORDER — ISOSORBIDE MONONITRATE ER 60 MG PO TB24
120.0000 mg | ORAL_TABLET | Freq: Every day | ORAL | Status: DC
  Administered 2022-09-05 – 2022-09-07 (×3): 120 mg via ORAL
  Filled 2022-09-04: qty 4
  Filled 2022-09-04 (×2): qty 2

## 2022-09-04 MED ORDER — ACETAMINOPHEN 650 MG RE SUPP: 650.0000 mg | Freq: Four times a day (QID) | RECTAL | Status: DC | PRN

## 2022-09-04 NOTE — Assessment & Plan Note (Addendum)
Syncope at home. Multifactorial, hypovolemic, vasovagal, bradycardia.  He has tolerated well IV fluids.   Patient was placed on bedside telemetry, he had sinus bradycardia with 1st degree AV block and occasional PAC. Carvedilol was held with improvement in heart rate, at the time of his discharge is at 60 bpm range.   Plan to hold on carvedilol and to continue with amiodarone.

## 2022-09-04 NOTE — ED Provider Triage Note (Signed)
Emergency Medicine Provider Triage Evaluation Note  Albert Powell , a 81 y.o. male  was evaluated in triage.  Pt complains of diffuse chest heaviness with associated shortness of breath that started today.  He denies any previous symptoms in the past.  He denies any cough, fever, chills.  Review of Systems  Positive:  Negative: See above   Physical Exam  BP (!) 149/62 (BP Location: Right Arm)   Pulse (!) 50   Temp 98.4 F (36.9 C)   Resp 18   SpO2 100%  Gen:   Awake, no distress   Resp:  Normal effort  MSK:   Moves extremities without difficulty  Other:    Medical Decision Making  Medically screening exam initiated at 1:05 PM.  Appropriate orders placed.  Albert Powell was informed that the remainder of the evaluation will be completed by another provider, this initial triage assessment does not replace that evaluation, and the importance of remaining in the ED until their evaluation is complete.     Honor Loh [place], [place] 09/04/22 1309

## 2022-09-04 NOTE — Assessment & Plan Note (Addendum)
His glucose remained stable during his hospitalization, at the time of his discharge his fasting glucose is 122 mg/dl.   Continue atorvastatin.

## 2022-09-04 NOTE — Progress Notes (Signed)
Paramedicine Encounter    Patient ID: Albert Powell, male    DOB: 1940-12-24, 81 y.o.   MRN: [id number]   BP (!) 160/90 (BP Location: Left Arm, Patient Position: Sitting, Cuff Size: Normal)   Pulse 60   Resp 16   SpO2 98%  Weight yesterday-not taken Last visit weight-202lb  ATF Mr. Tabora A&O x 4, skin W&D w/ good color.  Pt. Denies chest pain or SOB.  Lung sounds clear and equal bilat  He has poor medication compliance - he had taken all AM meds and only taken 3 of his noon and PM doses.   Potassium was added to his regimen at discharge on 12/15 and was just added to pill box at today's visit.  He has some mild edema in both feet but not ankles.   Having difficulty getting a reading w/ his cardiomems, not sure what the problem is but will reach out to troubleshoot possible issues. Reminded pt of his clinic appointment tomorrow at 12:00 at HF clinic.  I advised him and Albert Powell to be ready between 10:00 and 10:50 and transportation should come to the door for pick up. Reinforced the importance of making his appointment as he has 2 hospitalizations w/o follow up.  He advised he would be ready.  Home visit complete.    Beatrix Shipper, EMT-Paramedic [phone] 09/04/2022    Patient Care Team: Patient, No Pcp Per as PCP - General (General Practice) [name], MD as PCP - Cardiology (Cardiology) [name], MD as Consulting Physician (Cardiology) Almetta Lovely, NP as [facility] Care Management (Geriatric Medicine)  Patient Active Problem List   Diagnosis Date Noted   Hypokalemia 06/23/2022   Elevated troponin 06/23/2022   Type 2 diabetes mellitus with hyperlipidemia ([name]) 05/19/2022   Acute combined systolic and diastolic heart failure ([name])    Acute kidney injury superimposed on chronic kidney disease ([name]) 05/13/2022   GERD (gastroesophageal reflux disease) 05/13/2022   Acquired hypothyroidism 05/13/2022   Acute on chronic systolic CHF  (congestive heart failure) ([name]) 11/23/2021   PAF (paroxysmal atrial fibrillation) ([name]) 10/08/2021   Acute on chronic diastolic heart failure ([name]) 06/07/2021   Acute metabolic encephalopathy    CHF (congestive heart failure) ([name]) 03/10/2021   Uncontrolled hypertension 03/10/2021   Hypertensive crisis 01/01/2021   ACS (acute coronary syndrome) ([name])    AKI (acute kidney injury) ([name])    Atrial fibrillation ([name]) 03/30/2020   Contusion of right hand 11/12/2018   Primary osteoarthritis of both first carpometacarpal joints 11/12/2018   Pain in joint of right shoulder 10/05/2018   Pain in right hand 10/05/2018   Hematoma 03/30/2018   Degeneration of lumbar intervertebral disc 12/21/2017   Lumbar radiculopathy 12/21/2017   Vertigo 04/09/2017   Hypogonadism in male 12/07/2014   Screening for prostate cancer 12/07/2014   Claudication of right lower extremity ([name]) 10/20/2013   Claudication in peripheral vascular disease- Rt leg 09/20/2013   Obesity (BMI 30-39.9)- negative sleep study in the past 09/20/2013   Occlusion and stenosis of carotid artery without mention of cerebral infarction 05/23/2013   PAD (peripheral artery disease) ([name]) 05/18/2013   Carotid artery disease ([name]) 04/14/2013   Bradycardia, sinus 02/18/2013   Tobacco abuse 02/05/2013   Chest pain, unstable angina, negative MI 02/04/2013   Type 2 diabetes mellitus with stage 3b chronic kidney disease ([name]) 02/04/2013   Coronary artery disease involving native coronary artery of native heart without angina pectoris 02/04/2013   Benign essential hypertension 07/10/2011  Incisional hernia 05/22/2011    Current Outpatient Medications:    amiodarone (PACERONE) 200 MG tablet, Take 1 tablet (200 mg total) by mouth 2 (two) times daily., Disp: 60 tablet, Rfl: 3   amLODipine (NORVASC) 10 MG tablet, Take 1 tablet (10 mg total) by mouth daily., Disp: 30 tablet, Rfl: 11   apixaban (ELIQUIS) 2.5 MG TABS tablet, Take 1 tablet (2.5 mg  total) by mouth 2 (two) times daily., Disp: 60 tablet, Rfl: 3   atorvastatin (LIPITOR) 40 MG tablet, Take 1 tablet (40 mg total) by mouth daily., Disp: 30 tablet, Rfl: 11   carvedilol (COREG) 25 MG tablet, Take 1 tablet (25 mg total) by mouth 2 (two) times daily with a meal., Disp: 60 tablet, Rfl: 3   clopidogrel (PLAVIX) 75 MG tablet, Take 1 tablet (75 mg total) by mouth daily., Disp: 30 tablet, Rfl: 11   glipiZIDE (GLUCOTROL) 5 MG tablet, Take 5 mg by mouth daily before breakfast., Disp: , Rfl:    hydrALAZINE (APRESOLINE) 100 MG tablet, Take 1 tablet (100 mg total) by mouth 3 (three) times daily., Disp: 90 tablet, Rfl: 0   isosorbide mononitrate (IMDUR) 120 MG 24 hr tablet, Take 1 tablet (120 mg total) by mouth daily., Disp: 30 tablet, Rfl: 11   levothyroxine (SYNTHROID) 25 MCG tablet, TAKE ONE TABLET BY MOUTH DAILY BEFORE BREAKFAST, Disp: 90 tablet, Rfl: 3   pantoprazole (PROTONIX) 40 MG tablet, Take 1 tablet (40 mg total) by mouth every other day., Disp: 30 tablet, Rfl: 3   potassium chloride (KLOR-CON) 10 MEQ tablet, Take 1 tablet (10 mEq total) by mouth daily for 20 days., Disp: 20 tablet, Rfl: 0   spironolactone (ALDACTONE) 25 MG tablet, Take 1 tablet (25 mg total) by mouth daily., Disp: 30 tablet, Rfl: 3   torsemide (DEMADEX) 20 MG tablet, Take 2 tablets (40 mg total) by mouth daily., Disp: 120 tablet, Rfl: 4   ondansetron (ZOFRAN-ODT) 4 MG disintegrating tablet, Take 1 tablet (4 mg total) by mouth every 8 (eight) hours as needed for up to 15 doses for nausea or vomiting., Disp: 15 tablet, Rfl: 0 Allergies  Allergen Reactions   Fentanyl Other (See Comments)    Behavioral changes   Gabapentin Swelling   Lisinopril Other (See Comments)    unknown   Lyrica [Pregabalin] Other (See Comments)    unknown   Metformin Diarrhea   Propofol Other (See Comments)    Heart rate dropped      Social History   Socioeconomic History   Marital status: Married    Spouse name: Albert Powell    Number of children: 1   Years of education: Not on file   Highest education level: High school graduate  Occupational History   Occupation: retired  Tobacco Use   Smoking status: Former    Packs/day: 1.50    Years: 57.00    Total pack years: 85.50    Types: Cigarettes, E-cigarettes    Quit date: 2020    Years since quitting: 3.9   Smokeless tobacco: Never   Tobacco comments:    pt states that he is using the vapor cigs--12/2021  Vaping Use   Vaping Use: Former   Start date: 09/15/2016   Substances: Nicotine  Substance and Sexual Activity   Alcohol use: Yes    Alcohol/week: 1.0 standard drink of alcohol    Types: 1 Glasses of wine per week    Comment: "very little"   Drug use: No   Sexual activity: Not on  file  Other Topics Concern   Not on file  Social History Narrative   Not on file   Social Determinants of Health   Financial Resource Strain: Low Risk  (06/03/2022)   Overall Financial Resource Strain (CARDIA)    Difficulty of Paying Living Expenses: Not hard at all  Food Insecurity: No Food Insecurity (08/14/2022)   Hunger Vital Sign    Worried About Running Out of Food in the Last Year: Never true    Ran Out of Food in the Last Year: Never true  Transportation Needs: Unmet Transportation Needs (08/21/2022)   PRAPARE - Administrator, Civil Service (Medical): Yes    Lack of Transportation (Non-Medical): No  Physical Activity: Not on file  Stress: Not on file  Social Connections: Not on file  Intimate Partner Violence: Not At Risk (08/14/2022)   Humiliation, Afraid, Rape, and Kick questionnaire    Fear of Current or Ex-Partner: No    Emotionally Abused: No    Physically Abused: No    Sexually Abused: No    Physical Exam      Future Appointments  Date Time Provider Department Center  09/05/2022 12:00 PM [facility] PA/NP [facility] None       Beatrix Shipper, EMT-P-Paramedic [phone] [facility] Paramedic  09/04/22

## 2022-09-04 NOTE — ED Notes (Signed)
[phone] Albert Powell pt daughter would like a update.

## 2022-09-04 NOTE — Assessment & Plan Note (Addendum)
Last EF 25-30% on 05/14/2022.  RHC 07/02/2022 showed mild pulmonary venous hypertension, CardioMEMS placed at that time.  Appears overall volume depleted.  Seen by advanced heart failure team.  Continue to hold on diuretic therapy. Telemetry monitoring

## 2022-09-04 NOTE — ED Provider Notes (Signed)
[facility] EMERGENCY DEPARTMENT Provider Note   CSN: [id number] Arrival date & time: 09/04/22  1231     History  Chief Complaint  Patient presents with   Chest Pain    Albert Powell is a 81 y.o. male.  HPI     81yo male with history of chronic diastolic heart failure with recent drop in left ventricular nuclear ejection fraction of 2025%, catheterization 05/2022 with mild to moderate coronary artery disease, RHC 06/2022 with CardioMEMS placement, atrial fibrillation on Eliquis, hypertension, hyperlipidemia, hypothyroidism who presents to concern for chest pain and near syncope.  Last catheterization was October 18  Chest pressure started today Felt lightheaded No shortness of breath now, did have it initially. Just doesn't remember.  Didn't have syncope. Just went blank.  Didn't fall but felt weak.  Legs just quit working, both legs. Pressure on chest was uncomfortable, not sure how to rate it No leg swelling No fever, cough, congestion Low back with pain  Per wife, got into the tub ok, was sitting on the bench he felt ok, then he started leaning back like he thought there was a back to the bench, then he seemed more disoriented, confused/ Had happened before.  He did not have syncope, just seemed very weak, near syncope.  His wife reports he has a girlfriend who she doesn't know.   She reports he shouldn't be driving and not capable. Wife reports beautiful marriage for 50 years but now it is different, thinks he is irrational and easily agitated.  Has not been acting himself.  She denies him falling and hitting his head today.    Past Medical History:  Diagnosis Date   Atrial fibrillation ([name]) 03/30/2020   Bradycardia, sinus 02/18/2013   CAD (coronary artery disease)    stent to RCA 2003 also had 40% lesion at that time; NUCLEAR STRESS TEST, 01/16/2010 - normal  now with cath 80-90% stenosis in LAD, will try medical therapy if no improvement PCI    Carotid artery disease ([name])    Cataract    CHF (congestive heart failure) ([name])    Chronic kidney disease (CKD), stage III (moderate) ([name])    Claudication ([name])    LEA DUPLEX, 07/07/2008 - Normal   COPD (chronic obstructive pulmonary disease) ([name])    DDD (degenerative disc disease) 2008   Diabetes mellitus    GERD (gastroesophageal reflux disease)    H/O hiatal hernia    History of colonoscopy 2004   finding of tics and AMV only no polpys   Hyperlipidemia 02/05/2013   Hypertension    Hypothyroidism    Obesity    Onychomycosis    PAD (peripheral artery disease) ([name])    Rosacea    Tobacco abuse 02/05/2013     Home Medications Prior to Admission medications   Medication Sig Start Date End Date Taking? Authorizing Provider  amiodarone (PACERONE) 200 MG tablet Take 1 tablet (200 mg total) by mouth 2 (two) times daily. 08/05/22   Laurey Morale, MD  amLODipine (NORVASC) 10 MG tablet Take 1 tablet (10 mg total) by mouth daily. 08/05/22 08/05/23  Laurey Morale, MD  apixaban (ELIQUIS) 2.5 MG TABS tablet Take 1 tablet (2.5 mg total) by mouth 2 (two) times daily. 08/05/22   Laurey Morale, MD  atorvastatin (LIPITOR) 40 MG tablet Take 1 tablet (40 mg total) by mouth daily. 08/15/22   Laurey Morale, MD  carvedilol (COREG) 25 MG tablet Take 1 tablet (25 mg total)  by mouth 2 (two) times daily with a meal. 08/05/22   Laurey Morale, MD  clopidogrel (PLAVIX) 75 MG tablet Take 1 tablet (75 mg total) by mouth daily. 08/15/22   Laurey Morale, MD  glipiZIDE (GLUCOTROL) 5 MG tablet Take 5 mg by mouth daily before breakfast.    [provider]  hydrALAZINE (APRESOLINE) 100 MG tablet Take 1 tablet (100 mg total) by mouth 3 (three) times daily. 08/05/22 09/04/22  Laurey Morale, MD  isosorbide mononitrate (IMDUR) 120 MG 24 hr tablet Take 1 tablet (120 mg total) by mouth daily. 08/15/22   Laurey Morale, MD  levothyroxine (SYNTHROID) 25 MCG tablet TAKE ONE TABLET BY MOUTH DAILY  BEFORE BREAKFAST 03/13/22   [name], MD  ondansetron (ZOFRAN-ODT) 4 MG disintegrating tablet Take 1 tablet (4 mg total) by mouth every 8 (eight) hours as needed for up to 15 doses for nausea or vomiting. 08/28/22   Terald Sleeper, MD  pantoprazole (PROTONIX) 40 MG tablet Take 1 tablet (40 mg total) by mouth every other day. 07/30/22   Robbie Lis M, PA-C  potassium chloride (KLOR-CON) 10 MEQ tablet Take 1 tablet (10 mEq total) by mouth daily for 20 days. 08/28/22 09/17/22  Terald Sleeper, MD  spironolactone (ALDACTONE) 25 MG tablet Take 1 tablet (25 mg total) by mouth daily. 08/05/22   Laurey Morale, MD  torsemide (DEMADEX) 20 MG tablet Take 2 tablets (40 mg total) by mouth daily. 08/05/22   Laurey Morale, MD      Allergies    Fentanyl, Gabapentin, Lisinopril, Lyrica [pregabalin], Metformin, and Propofol    Review of Systems   Review of Systems  Physical Exam Updated Vital Signs BP (!) 156/86   Pulse 62   Temp 98.4 F (36.9 C)   Resp 16   SpO2 94%  Physical Exam  ED Results / Procedures / Treatments   Labs (all labs ordered are listed, but only abnormal results are displayed) Labs Reviewed  BASIC METABOLIC PANEL - Abnormal; Notable for the following components:      Result Value   Sodium 130 (*)    Chloride 92 (*)    Glucose, Bld 158 (*)    BUN 25 (*)    Creatinine, Ser 2.51 (*)    GFR, Estimated 25 (*)    All other components within normal limits  CBC - Abnormal; Notable for the following components:   WBC 12.3 (*)    All other components within normal limits  LACTIC ACID, PLASMA - Abnormal; Notable for the following components:   Lactic Acid, Venous 4.1 (*)    All other components within normal limits  HEPATIC FUNCTION PANEL - Abnormal; Notable for the following components:   Total Protein 6.2 (*)    All other components within normal limits  BRAIN NATRIURETIC PEPTIDE - Abnormal; Notable for the following components:   B Natriuretic Peptide  226.5 (*)    All other components within normal limits  TROPONIN I (HIGH SENSITIVITY) - Abnormal; Notable for the following components:   Troponin I (High Sensitivity) 26 (*)    All other components within normal limits  TROPONIN I (HIGH SENSITIVITY) - Abnormal; Notable for the following components:   Troponin I (High Sensitivity) 21 (*)    All other components within normal limits  URINE CULTURE  LACTIC ACID, PLASMA  URINALYSIS, ROUTINE W REFLEX MICROSCOPIC    EKG EKG Interpretation  Date/Time:  Thursday September 04 2022 12:43:21 EST Ventricular Rate:  51 PR Interval:    QRS Duration: 70 QT Interval:  550 QTC Calculation: 506 R Axis:   -37 Text Interpretation: Suspect atrial fibrillation with slow ventricular response Left axis deviation ST & T wave abnormality, consider lateral ischemia Abnormal ECG When compared with ECG of 28-Aug-2022 17:39, not significantly changed, slight increase in ST abnormalities laterally Confirmed by Alvira Monday ([id number]) on 09/04/2022 1:09:50 PM  Radiology DG Chest 2 View  Result Date: 09/04/2022 CLINICAL DATA:  Chest pain and weakness EXAM: CHEST - 2 VIEW COMPARISON:  Chest 06/23/2022 FINDINGS: Heart size within normal limits. Negative for heart failure. Lungs clear without infiltrate or effusion Spinal cord stimulator midthoracic spine unchanged Foreign body in the left lower lobe likely in the pulmonary artery. This was not present previously. Foreign body measures approximately 3 x 12 mm. IMPRESSION: 1. No active cardiopulmonary disease. 2. Foreign body in the left lower lobe likely in the pulmonary artery. Electronically Signed   By: Marlan Palau M.D.   On: 09/04/2022 13:11    Procedures Procedures    Medications Ordered in ED Medications  sodium chloride 0.9 % bolus 250 mL (has no administration in time range)    ED Course/ Medical Decision Making/ A&P                            81yo male with history of chronic diastolic heart  failure with recent drop in left ventricular nuclear ejection fraction of 2025%, catheterization 05/2022 with mild to moderate coronary artery disease, RHC 06/2022 with CardioMEMS placement, atrial fibrillation on Eliquis, hypertension, hyperlipidemia, hypothyroidism who presents to concern for chest pain and near syncope.  EKG evaluated by me no evidence of acute ST changes, similar atrial fibrillation with a slow rate.  Labs are completed and personally abided interpreted by me show mildly increasing creatinine over the last several months, which may be due to diuresis/dehydration.  Troponin is mildly elevated, but less than priors.  BNP is significantly less than prior.  Consulted cardiology given his heart failure history, near syncope, for evaluation of his CardioMEMS to help with fluid status. Clinically, suspect low volume.    Differential diagnosis for chest pain includes pulmonary embolus, dissection, pneumothorax, pneumonia, ACS, myocarditis, pericarditis.  Chest x-ray was done and evaluated by me and radiology and showed no sign of pneumonia or pneumothorax.  Doubt PE given on anticoagulation, no dyspnea.  Hx, exam not consistent with dissection.    Lactic acid elevated to 4.1. Do not see infectious source, although UA pending and mild elevation in WBC.  Possible this is related to dehydration, or for transient hypotension (?arrhythmia) in setting of chest pain and lightheadedness/generalized weakness.  Wife reports concern he is confused.  (Reportedly per notes she has hx of dementia but she was able to give details of near-syncope today that he was not able to give)--will order Head CT to evaluate for ICH, UA to evaluate for infection. These are pending at time of transfer of care.  Anticipate admission given elevated lactic acid, near syncope in CHF pt, elevated Cr possibly secondary to dehydration> Heart failure team to see.          Final Clinical Impression(s) / ED  Diagnoses Final diagnoses:  Chest pain, unspecified type  Near syncope  Lactic acidosis    Rx / DC Orders ED Discharge Orders     None         Alvira Monday, MD 09/04/22 1651

## 2022-09-04 NOTE — ED Triage Notes (Signed)
Patient BIB [facility] from home for home for evaluation of chest pain and near-syncope. Patient had a near-syncopal episode once while in a bathtub and then again while standing after being assisted out of the bath tub. Per EMS, patient's primary care giver in his apartment is his wife who has dementia. Patient received 324 mg ASA, 4mg  zofran, 2x SL NTG with no reduction in chest pain, and 500 mL. 18g saline lock in right AC. Patient is alert and in no apparent distress at this time.  HR 40

## 2022-09-04 NOTE — Assessment & Plan Note (Signed)
Sodium 130, in the setting of hypovolemia.  Continue gentle IV NS overnight as above and repeat labs in AM.

## 2022-09-04 NOTE — Assessment & Plan Note (Addendum)
Continue with levothyroxine  

## 2022-09-04 NOTE — Consult Note (Signed)
Advanced Heart Failure Team Consult Note   Primary Physician: Patient, No Pcp Per PCP-Cardiologist:  Nanetta Batty, MD [facility] [place]: Dr. Shirlee Latch   Reason for Consultation: chest pain and management of HF   HPI:    Albert Powell is seen today for evaluation of chest pain and management of heart failure at the request of Dr. Silverio Lay, Emergency Medicine.   81 y/o male white male w/ h/o hypertension, chronic combined systolic and diastolic heart failure, CAD s/p stenting x2 to RCA in 2003 with residual LAD disease, carotid artery disease s/p LICA endarterectomy 04/2013, LE peripheral arterial disease, hyperlipidemia, T2DM, renal artery stenosis, CKD IIIB and dementia.   He has had multiple readmissions and ED visits over the last year for a/c CHF and hypertensive urgencies.   Echo during admit 3/23 showed normal LVEF 50-55%, G1DD and normal RV.    Admitted 8/23 w/ a/c CHF. Echo showed drop in EF, down to 25-30%, RV normal. Noted to be back in Afib w/ RVR when echo was done.    Readmitted again 9/23 for HF. R/LHC was done and showed mild to moderate obstructive coronary artery disease. CM out of proportion to degree of CAD. RHC showed normal filling pressures and perserved CO, CI 2.7 L/min/m.  Had repeat RHC 10/23 for CardioMEMs placement. Hemodynamics: RA 3, PA 45/21, PWC 21, CO 6.25, CI 3.08.   Unfortunately he has not followed up since last admission. Canceled and no showed 2 appts.   Now presents to ED w/ complaint of chest pain and ? "Syncope" reported in ED notes, however pt reports his legs got weak and he fell.   Hs trop 24>>20>>26>>21. EKG junctional bradycardia, 51 bpm, no acute ST abnormalities.   BNP mildly elevated, only 226 (3,698 previous admit). CXR no active cardiopulmonary disease.   Labs show elevated LA, 4.1 and dehydration, SCr elevated 2.5 (b/l ~2). Bedside US also done and shows IVC is small and undefiled, also c/w dehydration. He denies d/n/v. No fever or chills. WBC  12K. Hgb 13.9.   Denies dyspnea. Currently CP free. States that CP he had earlier was not exertional.   He has had some positional dizziness. Head CT pending.   Reviewed w/ clinic staff who assessed his CardioMEMs record. He has not been compliant w/ sending daily transmissions. Last sent 12/7.   Review of Systems: [y] = yes,  = no   General: Weight gain ; Weight loss ; Anorexia ; Fatigue [ Y]; Fever ; Chills ; Weakness [ Y]  Cardiac: Chest pain/pressure [ Y]; Resting SOB ; Exertional SOB ; Orthopnea ; Pedal Edema ; Palpitations ; Syncope ; Presyncope ; Paroxysmal nocturnal dyspnea[ ]   Pulmonary: Cough ; Wheezing[ ] ; Hemoptysis[ ] ; Sputum ; Snoring   GI: Vomiting[ ] ; Dysphagia[ ] ; Melena[ ] ; Hematochezia ; Heartburn[ ] ; Abdominal pain ; Constipation ; Diarrhea ; BRBPR   GU: Hematuria[ ] ; Dysuria ; Nocturia[ ]   Vascular: Pain in legs with walking ; Pain in feet with lying flat ; Non-healing sores ; Stroke ; TIA ; Slurred speech ;  Neuro: Headaches[ ] ; Vertigo[ ] ; Seizures[ ] ; Paresthesias[ ] ;Blurred vision ; Diplopia ; Vision changes   Ortho/Skin: Arthritis ; Joint pain ; Muscle pain ; Joint swelling ; Back Pain ; Rash   Psych: Depression[ ] ;  Anxiety[ ]   Heme: Bleeding problems [ ] ; Clotting disorders [ ] ; Anemia [ ]   Endocrine: Diabetes [ ] ; Thyroid dysfunction[ ]   Home Medications Prior to Admission medications   Medication Sig Start Date End Date Taking? Authorizing Provider  amiodarone (PACERONE) 200 MG tablet Take 1 tablet (200 mg total) by mouth 2 (two) times daily. 08/05/22   , MD  amLODipine (NORVASC) 10 MG tablet Take 1 tablet (10 mg total) by mouth daily. 08/05/22 08/05/23  08/07/22, MD  apixaban (ELIQUIS) 2.5 MG TABS tablet Take 1 tablet (2.5 mg total) by mouth 2 (two) times daily. 08/05/22   08/07/22, MD  atorvastatin (LIPITOR) 40 MG  tablet Take 1 tablet (40 mg total) by mouth daily. 08/15/22   Laurey Morale, MD  carvedilol (COREG) 25 MG tablet Take 1 tablet (25 mg total) by mouth 2 (two) times daily with a meal. 08/05/22   Laurey Morale, MD  clopidogrel (PLAVIX) 75 MG tablet Take 1 tablet (75 mg total) by mouth daily. 08/15/22   Laurey Morale, MD  glipiZIDE (GLUCOTROL) 5 MG tablet Take 5 mg by mouth daily before breakfast.    [provider]  hydrALAZINE (APRESOLINE) 100 MG tablet Take 1 tablet (100 mg total) by mouth 3 (three) times daily. 08/05/22 09/04/22  14/1/23, MD  isosorbide mononitrate (IMDUR) 120 MG 24 hr tablet Take 1 tablet (120 mg total) by mouth daily. 08/15/22   08/07/22, MD  levothyroxine (SYNTHROID) 25 MCG tablet TAKE ONE TABLET BY MOUTH DAILY BEFORE BREAKFAST 03/13/22   Laurey Morale, MD  ondansetron (ZOFRAN-ODT) 4 MG disintegrating tablet Take 1 tablet (4 mg total) by mouth every 8 (eight) hours as needed for up to 15 doses for nausea or vomiting. 08/28/22   Laurey Morale, MD  pantoprazole (PROTONIX) 40 MG tablet Take 1 tablet (40 mg total) by mouth every other day. 07/30/22   [name] M, PA-C  potassium chloride (KLOR-CON) 10 MEQ tablet Take 1 tablet (10 mEq total) by mouth daily for 20 days. 08/28/22 09/17/22  08/01/22, MD  spironolactone (ALDACTONE) 25 MG tablet Take 1 tablet (25 mg total) by mouth daily. 08/05/22   08/30/22, MD  torsemide (DEMADEX) 20 MG tablet Take 2 tablets (40 mg total) by mouth daily. 08/05/22   Terald Sleeper, MD    Past Medical History: Past Medical History:  Diagnosis Date   Atrial fibrillation ([name]) 03/30/2020   Bradycardia, sinus 02/18/2013   CAD (coronary artery disease)    stent to RCA 2003 also had 40% lesion at that time; NUCLEAR STRESS TEST, 01/16/2010 - normal  now with cath 80-90% stenosis in LAD, will try medical therapy if no improvement PCI   Carotid artery disease ([name])    Cataract    CHF (congestive  heart failure) ([name])    Chronic kidney disease (CKD), stage III (moderate) ([name])    Claudication ([name])    LEA DUPLEX, 07/07/2008 - Normal   COPD (chronic obstructive pulmonary disease) ([name])    DDD (degenerative disc disease) 2008   Diabetes mellitus    GERD (gastroesophageal reflux disease)    H/O hiatal hernia    History of colonoscopy 2004   finding of tics and AMV only no polpys   Hyperlipidemia 02/05/2013   Hypertension    Hypothyroidism    Obesity    Onychomycosis    PAD (peripheral artery disease) ([name])    Rosacea  Tobacco abuse 02/05/2013    Past Surgical History: Past Surgical History:  Procedure Laterality Date   ABDOMINAL AORTOGRAM W/LOWER EXTREMITY N/A 03/30/2018   Procedure: ABDOMINAL AORTOGRAM W/LOWER EXTREMITY;  Surgeon: Nada Libman, MD;  Location: [facility];  Service: Cardiovascular;  Laterality: N/A;   ABDOMINAL AORTOGRAM W/LOWER EXTREMITY Bilateral 08/27/2020   Procedure: ABDOMINAL AORTOGRAM W/LOWER EXTREMITY;  Surgeon: [name], MD;  Location: [facility];  Service: Cardiovascular;  Laterality: Bilateral;   APPENDECTOMY     CARDIAC CATHETERIZATION  08/29/2002   2-vessel CAD with high-grade stenoses in RCA; stenting to RCA   CARDIAC CATHETERIZATION  2014   80-90% lesion will try medical therapy   CARDIAC SURGERY     stents  2003   CARDIOVERSION N/A 11/01/2013   Procedure: Dimple Nanas COMPRESSION;  Surgeon: [name], MD;  Location: [facility] C[facility] LAB;  Service: Cardiovascular;  Laterality: N/A;   CAROTID ANGIOGRAM N/A 04/25/2013   Procedure: CAROTID ANGIOGRAM;  Surgeon: [name], MD;  Location: [facility] CATH LAB;  Service: Cardiovascular;  Laterality: N/A;   CAROTID ENDARTERECTOMY     COLON RESECTION  3/04, 6/04    1 bleeding diverticulitis, 2 complete colectomy   COLON SURGERY  2005   renal pouch rectal anastimosis   CORONARY ANGIOPLASTY WITH STENT PLACEMENT  09/2002   2 stents to RCA   ENDARTERECTOMY Left 06/02/2013    Procedure: ENDARTERECTOMY CAROTID-LEFT;  Surgeon: Nada Libman, MD;  Location: [facility] OR;  Service: Vascular;  Laterality: Left;   INTRAVASCULAR PRESSURE WIRE/FFR STUDY N/A 04/02/2020   Procedure: INTRAVASCULAR PRESSURE WIRE/FFR STUDY;  Surgeon: [name], MD;  Location: [facility];  Service: Cardiovascular;  Laterality: N/A;  DFR - RCA   LEFT HEART CATH AND CORONARY ANGIOGRAPHY N/A 04/02/2020   Procedure: LEFT HEART CATH AND CORONARY ANGIOGRAPHY;  Surgeon: [name], MD;  Location: [facility];  Service: Cardiovascular;  Laterality: N/A;   LEFT HEART CATHETERIZATION WITH CORONARY ANGIOGRAM N/A 02/08/2013   Procedure: LEFT HEART CATHETERIZATION WITH CORONARY ANGIOGRAM;  Surgeon: [name], MD;  Location: [facility] CATH LAB;  Service: Cardiovascular;  Laterality: N/A;   LOWER EXTREMITY ANGIOGRAM N/A 10/20/2013   Procedure: LOWER EXTREMITY ANGIOGRAM;  Surgeon: [name], MD;  Location: [facility] CATH LAB;  Service: Cardiovascular;  Laterality: N/A;   PATCH ANGIOPLASTY Left 06/02/2013   Procedure: PATCH ANGIOPLASTY;  Surgeon: Nada Libman, MD;  Location: [facility] OR;  Service: Vascular;  Laterality: Left;   PERIPHERAL VASCULAR INTERVENTION Bilateral 03/30/2018   Procedure: PERIPHERAL VASCULAR INTERVENTION;  Surgeon: Nada Libman, MD;  Location: [facility];  Service: Cardiovascular;  Laterality: Bilateral;  common iliacs   PRESSURE SENSOR/CARDIOMEMS N/A 07/02/2022   Procedure: PRESSURE SENSOR/CARDIOMEMS;  Surgeon: Laurey Morale, MD;  Location: [facility] INVASIVE CV LAB;  Service: Cardiovascular;  Laterality: N/A;   RIGHT HEART CATH N/A 07/02/2022   Procedure: RIGHT HEART CATH;  Surgeon: Laurey Morale, MD;  Location: [facility] INVASIVE CV LAB;  Service: Cardiovascular;  Laterality: N/A;   RIGHT/LEFT HEART CATH AND CORONARY ANGIOGRAPHY N/A 05/20/2022   Procedure: RIGHT/LEFT HEART CATH AND CORONARY ANGIOGRAPHY;  Surgeon: Orbie Pyo, MD;  Location: [facility];   Service: Cardiovascular;  Laterality: N/A;   TONSILLECTOMY      Family History: Family History  Problem Relation Age of Onset   Heart disease Father        heart attack at 47   Heart attack Father  Hyperlipidemia Father    Colonic polyp Mother        that bled out   COPD Mother    Diabetes Mother    Heart disease Sister    Colonic polyp Sister    Stroke Maternal Grandfather    Heart attack Paternal Grandfather    Other Neg Hx        hypogonadism    Social History: Social History   Socioeconomic History   Marital status: Married    Spouse name: Rakiem Factor   Number of children: 1   Years of education: Not on file   Highest education level: High school graduate  Occupational History   Occupation: retired  Tobacco Use   Smoking status: Former    Packs/day: 1.50    Years: 57.00    Total pack years: 85.50    Types: Cigarettes, E-cigarettes    Quit date: 2020    Years since quitting: 3.9   Smokeless tobacco: Never   Tobacco comments:    pt states that he is using the vapor cigs--12/2021  Vaping Use   Vaping Use: Former   Start date: 09/15/2016   Substances: Nicotine  Substance and Sexual Activity   Alcohol use: Yes    Alcohol/week: 1.0 standard drink of alcohol    Types: 1 Glasses of wine per week    Comment: "very little"   Drug use: No   Sexual activity: Not on file  Other Topics Concern   Not on file  Social History Narrative   Not on file   Social Determinants of Health   Financial Resource Strain: Low Risk  (06/03/2022)   Overall Financial Resource Strain (CARDIA)    Difficulty of Paying Living Expenses: Not hard at all  Food Insecurity: No Food Insecurity (08/14/2022)   Hunger Vital Sign    Worried About Running Out of Food in the Last Year: Never true    Ran Out of Food in the Last Year: Never true  Transportation Needs: Unmet Transportation Needs (08/21/2022)   PRAPARE - Administrator, Civil Service (Medical): Yes    Lack of  Transportation (Non-Medical): No  Physical Activity: Not on file  Stress: Not on file  Social Connections: Not on file    Allergies:  Allergies  Allergen Reactions   Fentanyl Other (See Comments)    Behavioral changes   Gabapentin Swelling   Lisinopril Other (See Comments)    unknown   Lyrica [Pregabalin] Other (See Comments)    unknown   Metformin Diarrhea   Propofol Other (See Comments)    Heart rate dropped    Objective:    Vital Signs:   Temp:  [98.4 F (36.9 C)] 98.4 F (36.9 C) (12/21 1240) Pulse Rate:  [50-60] 55 (12/21 1430) Resp:  [16-22] 18 (12/21 1430) BP: (149-160)/(62-90) 152/65 (12/21 1430) SpO2:  [97 %-100 %] 97 % (12/21 1430) Weight:  [89.3 kg] 89.3 kg (12/21 0938)    Weight change: There were no vitals filed for this visit.  Intake/Output:  No intake or output data in the 24 hours ending 09/04/22 1514    Physical Exam    General:  elderly WM. No resp difficulty HEENT: normal Neck: supple. JVP not elevated . Carotids 2+ bilat; no bruits. No lymphadenopathy or thyromegaly appreciated. Cor: PMI nondisplaced. Regular rate & rhythm. No rubs, gallops or murmurs. Lungs: clear Abdomen: soft, nontender, nondistended. No hepatosplenomegaly. No bruits or masses. Good bowel sounds. Extremities: no cyanosis, clubbing, rash, edema Neuro: alert &  orientedx3, cranial nerves grossly intact. moves all 4 extremities w/o difficulty. Affect pleasant   Telemetry   SB upper 50s   EKG    Junctional bradycardia 51 bpm   Labs   Basic Metabolic Panel: Recent Labs  Lab 08/28/22 1915 09/04/22 1252  NA 133* 130*  K 3.0* 3.7  CL 93* 92*  CO2 26 24  GLUCOSE 159* 158*  BUN 28* 25*  CREATININE 2.29* 2.51*  CALCIUM 9.1 9.0    Liver Function Tests: Recent Labs  Lab 08/28/22 1915 09/04/22 1252  AST 17 23  ALT 22 23  ALKPHOS 73 76  BILITOT 0.8 1.1  PROT 6.5 6.2*  ALBUMIN 3.4* 3.5   Recent Labs  Lab 08/28/22 1915  LIPASE 34   No results for  input(s): "AMMONIA" in the last 168 hours.  CBC: Recent Labs  Lab 08/28/22 1915 09/04/22 1252  WBC 9.1 12.3*  NEUTROABS 7.5  --   HGB 14.8 13.9  HCT 41.4 41.7  MCV 84.5 87.2  PLT 256 302    Cardiac Enzymes: No results for input(s): "CKTOTAL", "CKMB", "CKMBINDEX", "TROPONINI" in the last 168 hours.  BNP: BNP (last 3 results) Recent Labs    06/23/22 1752 06/24/22 0358 09/04/22 1252  BNP 3,165.5* 3,698.8* 226.5*    ProBNP (last 3 results) No results for input(s): "PROBNP" in the last 8760 hours.   CBG: No results for input(s): "GLUCAP" in the last 168 hours.  Coagulation Studies: No results for input(s): "LABPROT", "INR" in the last 72 hours.   Imaging   DG Chest 2 View  Result Date: 09/04/2022 CLINICAL DATA:  Chest pain and weakness EXAM: CHEST - 2 VIEW COMPARISON:  Chest 06/23/2022 FINDINGS: Heart size within normal limits. Negative for heart failure. Lungs clear without infiltrate or effusion Spinal cord stimulator midthoracic spine unchanged Foreign body in the left lower lobe likely in the pulmonary artery. This was not present previously. Foreign body measures approximately 3 x 12 mm. IMPRESSION: 1. No active cardiopulmonary disease. 2. Foreign body in the left lower lobe likely in the pulmonary artery. Electronically Signed   By: Marlan Palau M.D.   On: 09/04/2022 13:11     Medications:     Current Medications:   Infusions:     Patient Profile   81 y/o male white male w/ h/o hypertension, chronic combined systolic and diastolic heart failure, CAD s/p stenting x2 to RCA in 2003 with residual LAD disease, carotid artery disease s/p LICA endarterectomy 04/2013, LE peripheral arterial disease, hyperlipidemia, T2DM, renal artery stenosis,CKD IIIb dementia and poor compliance, presenting w/ atypical CP and found to be dehydrated w/ AKI. HS trop not c/w ACS.   Assessment/Plan   1. Chest Pain  - known CAD s/p prior RCA PCI + residual LAD disease - recent  CP atypical - HS trop elevation low level and trend not c/w ACS. EKG nonischemic - suspect demand ischemia in setting of dehydration. No plans for further w/u. Not candidate for LHC given CKD - continue medical management  2. Chronic Combined Systolic and Diastolic Heart Failure - Echo 1/61: normal LVEF 50-55%, G1DD and normal RV - Echo 8/23 drop in EF, down to 25-30%, RV normal. Suspect tachymediated from Afib  - RHC 10/23, normal filling pressures and output. CardioMEMs placed but pt not compliant w/ daily transmissions  - Chronically NYHA Class II-III. Dehydrated on exam. POCU w/ small underfilled IVC also suggestive of hypovolemia - hold diuretics and spiro  - hydrate w/ IVFs, start w/ 250  cc bolus - GDMT has been limited by CKD - BP ok. SBP 140s. Continue home hydral and Imdur   3. AKI on CKD IIIb - b/1 SCr ~2.0. Elevated 2.6 on admit - suspect prerenal from dehydration  - hydrate w/ IVFs - hold home torsemide and spiro for now  - follow BMP   4. Elevated Lactate - 4.1 on admit + mild leukocytosis but no infectious symptoms. CXR no PNA  - repeat lactate to ensure clearance - further w/u per primary team  - recommend checking UA  5. Dizziness/ ? Syncope - history unclear from patient, (has dementia) - suspect symptoms 2/2 volume depletion  - agree w/ head CT (ordered)   6. Dementia - per primary team   Recommend admission for management of dehydration by internal medicine team. AHF team will continue to follow as consultants    Length of Stay: 0  Knute Neu  09/04/2022, 3:14 PM  Advanced Heart Failure Team Pager [phone] (M-F; 7a - 5p)  Please contact [facility] for night-coverage after hours (4p -7a ) and weekends on [email or web address]

## 2022-09-04 NOTE — Hospital Course (Addendum)
Albert Powell was admitted to the hospital with the working diagnosis of AKI on CKD    81 y.o. male with medical history significant for chronic combined systolic and diastolic CHF (EF 45-85%) (has cardiomems implanted on 06/2022), CAD s/p PCI, PAF on Eliquis, carotid artery disease, PAD, CKD stage IIIb, T2DM, HTN, HLD, hypothyroidism, and dementia. Apparently patient had a syncope episode at home, EMS was called and patient was transported to the ED. Patient did not recall further details. On his initial physical examination his blood pressure was 149/62, HR 50, RR 18 and 02 saturation 100% on room air. Moist mucous membranes, heart with S1 and S2 present irregularly irregular with no gallops, respiratory with no rales or wheezing, abdomen with no distention, no lower extremity edema.   Na 130, K 3,7 Cl 92 bicarbonate 24 glucose 158 bun 25 cr 2,51  BNP 226  High sensitive troponin 26 and 21  Lactic acid 4,1 and 1.6  Wbc 12.3 hgb 13,9 plt 302   Chest radiograph with mild cardiomegaly with no infiltrates or effusions.   EKG 51 bpm, left axis deviation, normal intervals, sinus rhythm, with no significant ST segment or T wave changes.   Patient was placed on gently IV fluids for volume resuscitation.   12/23 renal function has been improving, patient continue to be bradycardic.

## 2022-09-04 NOTE — Assessment & Plan Note (Addendum)
Hyponatremia, hypokalemia.  CKD stage 3b.   Patient with improvement in his volume status, renal function with serum cr at 1,92 with K at 4,2 and serum bicarbonate at 26 Na 132 Cl 98  Likely resume diuretic therapy tomorrow.

## 2022-09-04 NOTE — Assessment & Plan Note (Signed)
Indeterminate lesion of the posterior right kidney measuring 1.4 cm noted on CT renal stone study.  Nonemergent renal protocol MRI recommended for further evaluation.

## 2022-09-04 NOTE — Assessment & Plan Note (Addendum)
Continue glucose cover and monitoring with insulin sliding scale.  Fasting glucose this am is 131

## 2022-09-04 NOTE — Assessment & Plan Note (Addendum)
Continue blood pressure control with amlodipine, and after load reduction with hydralazine and isosorbide.

## 2022-09-04 NOTE — Assessment & Plan Note (Addendum)
Patient has remained in sinus rhythm with 1st degree AV block. Heart rate has improved to 60 bpm.  Continue amiodarone and anticoagulation with apixaban.  Discontinue carvedilol.

## 2022-09-04 NOTE — ED Notes (Signed)
Patient is complaining of lower back pain

## 2022-09-04 NOTE — Assessment & Plan Note (Addendum)
Patient currently with no chest pain.  Acute coronary syndrome has been ruled out.  Continue blood pressure control and atorovastatin.  On clopidogrel.

## 2022-09-04 NOTE — H&P (Signed)
History and Physical    Albert Powell TDH:[id number] DOB: 01-Jun-1941 DOA: 09/04/2022  PCP: Patient, No Pcp Per  Patient coming from: Home  I have personally briefly reviewed patient's old medical records in [facility]  Chief Complaint: Chest pain  HPI: Albert Powell is a 81 y.o. male with medical history significant for chronic combined systolic and diastolic CHF (EF 64-68%), CAD s/p PCI, PAF on Eliquis, carotid artery disease, PAD, CKD stage IIIb, T2DM, HTN, HLD, hypothyroidism, dementia who presented to the ED for evaluation of chest pain and reported near syncope.  Patient does not really recall what happened.  He states he lives at home with his wife who has dementia.  He just remembers that he blacked out and when he awoke EMS were putting him in on a stretcher.  He cannot tell me what happened prior to this event.  He does not think he injured himself and is not having any musculoskeletal pain.  He was having some left-sided chest pressure when he came to the ED which has since resolved.  He says he has been urinating a lot and is feeling thirsty.  He denies any dyspnea, nausea, vomiting, diarrhea, abdominal pain, lower extremity swelling.  ED Course  Labs/Imaging on admission: I have personally reviewed following labs and imaging studies.  Initial vitals showed BP 149/62, pulse 50, RR 18, temp 98.4 F, SpO2 100% on room air.  Labs show WBC 12.3, hemoglobin 13.9, platelets 302,000, sodium 130, potassium 3.7, bicarb 24, BUN 25, creatinine 2.51 (recent baseline 1.9-2.0), serum glucose 158, lactic acid 4.1 > 1.6, troponin 26 > 21.  Urinalysis negative for UTI.  2 view chest x-ray negative for focal consolidation, edema, effusion.  Foreign body in the left lower lobe likely in the pulmonary artery noted, likely CardioMEMS device.  CT head without contrast negative for acute intracranial process.  CT renal stone study negative for acute findings in the abdomen or pelvis, no  evidence of obstructive uropathy.  Complex ventral abdominal wall hernia which contains nondilated loop of small bowel noted.  Indeterminate lesion of the posterior right kidney measuring 1.4 cm also seen.  Heart failure team consulted and recommended medical admission for hydration.  Patient was given 250 ccs normal saline.  The hospitalist service was consulted to admit for further evaluation and management.  Review of Systems: All systems reviewed and are negative except as documented in history of present illness above.   Past Medical History:  Diagnosis Date   Atrial fibrillation ([name]) 03/30/2020   Bradycardia, sinus 02/18/2013   CAD (coronary artery disease)    stent to RCA 2003 also had 40% lesion at that time; NUCLEAR STRESS TEST, 01/16/2010 - normal  now with cath 80-90% stenosis in LAD, will try medical therapy if no improvement PCI   Carotid artery disease ([name])    Cataract    CHF (congestive heart failure) ([name])    Chronic kidney disease (CKD), stage III (moderate) ([name])    Claudication ([name])    LEA DUPLEX, 07/07/2008 - Normal   COPD (chronic obstructive pulmonary disease) ([name])    DDD (degenerative disc disease) 2008   Diabetes mellitus    GERD (gastroesophageal reflux disease)    H/O hiatal hernia    History of colonoscopy 2004   finding of tics and AMV only no polpys   Hyperlipidemia 02/05/2013   Hypertension    Hypothyroidism    Obesity    Onychomycosis    PAD (peripheral artery disease) ([name])  Rosacea    Tobacco abuse 02/05/2013    Past Surgical History:  Procedure Laterality Date   ABDOMINAL AORTOGRAM W/LOWER EXTREMITY N/A 03/30/2018   Procedure: ABDOMINAL AORTOGRAM W/LOWER EXTREMITY;  Surgeon: Nada Libman, MD;  Location: [facility];  Service: Cardiovascular;  Laterality: N/A;   ABDOMINAL AORTOGRAM W/LOWER EXTREMITY Bilateral 08/27/2020   Procedure: ABDOMINAL AORTOGRAM W/LOWER EXTREMITY;  Surgeon: [name], MD;  Location: [facility];  Service: Cardiovascular;  Laterality: Bilateral;   APPENDECTOMY     CARDIAC CATHETERIZATION  08/29/2002   2-vessel CAD with high-grade stenoses in RCA; stenting to RCA   CARDIAC CATHETERIZATION  2014   80-90% lesion will try medical therapy   CARDIAC SURGERY     stents  2003   CARDIOVERSION N/A 11/01/2013   Procedure: Dimple Nanas COMPRESSION;  Surgeon: [name], MD;  Location: [facility] CATH LAB;  Service: Cardiovascular;  Laterality: N/A;   CAROTID ANGIOGRAM N/A 04/25/2013   Procedure: CAROTID ANGIOGRAM;  Surgeon: [name], MD;  Location: [facility] CATH LAB;  Service: Cardiovascular;  Laterality: N/A;   CAROTID ENDARTERECTOMY     COLON RESECTION  3/04, 6/04    1 bleeding diverticulitis, 2 complete colectomy   COLON SURGERY  2005   renal pouch rectal anastimosis   CORONARY ANGIOPLASTY WITH STENT PLACEMENT  09/2002   2 stents to RCA   ENDARTERECTOMY Left 06/02/2013   Procedure: ENDARTERECTOMY CAROTID-LEFT;  Surgeon: Nada Libman, MD;  Location: [facility] OR;  Service: Vascular;  Laterality: Left;   INTRAVASCULAR PRESSURE WIRE/FFR STUDY N/A 04/02/2020   Procedure: INTRAVASCULAR PRESSURE WIRE/FFR STUDY;  Surgeon: [name], MD;  Location: [facility];  Service: Cardiovascular;  Laterality: N/A;  DFR - RCA   LEFT HEART CATH AND CORONARY ANGIOGRAPHY N/A 04/02/2020   Procedure: LEFT HEART CATH AND CORONARY ANGIOGRAPHY;  Surgeon: [name], MD;  Location: [facility];  Service: Cardiovascular;  Laterality: N/A;   LEFT HEART CATHETERIZATION WITH CORONARY ANGIOGRAM N/A 02/08/2013   Procedure: LEFT HEART CATHETERIZATION WITH CORONARY ANGIOGRAM;  Surgeon: [name], MD;  Location: [facility] CATH LAB;  Service: Cardiovascular;  Laterality: N/A;   LOWER EXTREMITY ANGIOGRAM N/A 10/20/2013   Procedure: LOWER EXTREMITY ANGIOGRAM;  Surgeon: [name], MD;  [facility] C[facility] LAB;  Service: Cardiovascular;  Laterality: N/A;   PATCH ANGIOPLASTY Left 06/02/2013    Procedure: PATCH ANGIOPLASTY;  Surgeon: Nada Libman, MD;  Location: [facility] OR;  Service: Vascular;  Laterality: Left;   PERIPHERAL VASCULAR INTERVENTION Bilateral 03/30/2018   Procedure: PERIPHERAL VASCULAR INTERVENTION;  Surgeon: Nada Libman, MD;  Location: [facility];  Service: Cardiovascular;  Laterality: Bilateral;  common iliacs   PRESSURE SENSOR/CARDIOMEMS N/A 07/02/2022   Procedure: PRESSURE SENSOR/CARDIOMEMS;  Surgeon: Laurey Morale, MD;  Location: [facility] INVASIVE CV LAB;  Service: Cardiovascular;  Laterality: N/A;   RIGHT HEART CATH N/A 07/02/2022   Procedure: RIGHT HEART CATH;  Surgeon: Laurey Morale, MD;  Location: [facility] INVASIVE CV LAB;  Service: Cardiovascular;  Laterality: N/A;   RIGHT/LEFT HEART CATH AND CORONARY ANGIOGRAPHY N/A 05/20/2022   Procedure: RIGHT/LEFT HEART CATH AND CORONARY ANGIOGRAPHY;  Surgeon: Orbie Pyo, MD;  Location: [facility];  Service: Cardiovascular;  Laterality: N/A;   TONSILLECTOMY      Social History:  reports that he quit smoking about 3 years ago. His smoking use included cigarettes and e-cigarettes. He has a 85.50 pack-year smoking history. He has never used  smokeless tobacco. He reports current alcohol use of about 1.0 standard drink of alcohol per week. He reports that he does not use drugs.  Allergies  Allergen Reactions   Fentanyl Other (See Comments)    Behavioral changes   Gabapentin Swelling   Lisinopril Other (See Comments)    unknown   Lyrica [Pregabalin] Other (See Comments)    unknown   Metformin Diarrhea   Propofol Other (See Comments)    Heart rate dropped    Family History  Problem Relation Age of Onset   Heart disease Father        heart attack at 63   Heart attack Father    Hyperlipidemia Father    Colonic polyp Mother        that bled out   COPD Mother    Diabetes Mother    Heart disease Sister    Colonic polyp Sister    Stroke Maternal Grandfather    Heart attack Paternal Grandfather     Other Neg Hx        hypogonadism     Prior to Admission medications   Medication Sig Start Date End Date Taking? Authorizing Provider  amiodarone (PACERONE) 200 MG tablet Take 1 tablet (200 mg total) by mouth 2 (two) times daily. 08/05/22   Laurey Morale, MD  amLODipine (NORVASC) 10 MG tablet Take 1 tablet (10 mg total) by mouth daily. 08/05/22 08/05/23  Laurey Morale, MD  apixaban (ELIQUIS) 2.5 MG TABS tablet Take 1 tablet (2.5 mg total) by mouth 2 (two) times daily. 08/05/22   Laurey Morale, MD  atorvastatin (LIPITOR) 40 MG tablet Take 1 tablet (40 mg total) by mouth daily. 08/15/22   Laurey Morale, MD  carvedilol (COREG) 25 MG tablet Take 1 tablet (25 mg total) by mouth 2 (two) times daily with a meal. 08/05/22   Laurey Morale, MD  clopidogrel (PLAVIX) 75 MG tablet Take 1 tablet (75 mg total) by mouth daily. 08/15/22   Laurey Morale, MD  glipiZIDE (GLUCOTROL) 5 MG tablet Take 5 mg by mouth daily before breakfast.    [provider]  hydrALAZINE (APRESOLINE) 100 MG tablet Take 1 tablet (100 mg total) by mouth 3 (three) times daily. 08/05/22 09/04/22  Laurey Morale, MD  isosorbide mononitrate (IMDUR) 120 MG 24 hr tablet Take 1 tablet (120 mg total) by mouth daily. 08/15/22   Laurey Morale, MD  levothyroxine (SYNTHROID) 25 MCG tablet TAKE ONE TABLET BY MOUTH DAILY BEFORE BREAKFAST 03/13/22   [name], MD  ondansetron (ZOFRAN-ODT) 4 MG disintegrating tablet Take 1 tablet (4 mg total) by mouth every 8 (eight) hours as needed for up to 15 doses for nausea or vomiting. 08/28/22   Terald Sleeper, MD  pantoprazole (PROTONIX) 40 MG tablet Take 1 tablet (40 mg total) by mouth every other day. 07/30/22   Robbie Lis M, PA-C  potassium chloride (KLOR-CON) 10 MEQ tablet Take 1 tablet (10 mEq total) by mouth daily for 20 days. 08/28/22 09/17/22  Terald Sleeper, MD  spironolactone (ALDACTONE) 25 MG tablet Take 1 tablet (25 mg total) by mouth daily. 08/05/22    Laurey Morale, MD  torsemide (DEMADEX) 20 MG tablet Take 2 tablets (40 mg total) by mouth daily. 08/05/22   Laurey Morale, MD    Physical Exam: Vitals:   09/04/22 1630 09/04/22 1645 09/04/22 1732 09/04/22 1735  BP: (!) 156/86 100/80  (!) 159/69  Pulse: 62 60  (!)  59  Resp: Temp:   97.9 F (36.6 C)   TempSrc:   Oral   SpO2: 94% 95%  97%   Constitutional: Resting in bed, NAD, calm, comfortable Eyes: EOMI, lids and conjunctivae normal ENMT: Mucous membranes are moist. Posterior pharynx clear of any exudate or lesions.Normal dentition.  Neck: normal, supple, no masses. Respiratory: clear to auscultation bilaterally, no wheezing, no crackles. Normal respiratory effort. No accessory muscle use.  Cardiovascular: Irregularly irregular, no murmurs / rubs / gallops. No extremity edema. 2+ pedal pulses. Abdomen: no tenderness, no masses palpated.  Musculoskeletal: no clubbing / cyanosis. No joint deformity upper and lower extremities. Good ROM, no contractures. Normal muscle tone.  Skin: no rashes, lesions, ulcers. No induration Neurologic:  Sensation intact. Strength 5/5 in all 4.  Psychiatric: Alert and oriented x 3. Normal mood.   EKG: Personally reviewed. Junctional bradycardia, rate 51.  Similar to prior.  Assessment/Plan Principal Problem:   Acute kidney injury superimposed on chronic kidney disease stage IIIa ([name]) Active Problems:   Type 2 diabetes mellitus with stage 3b chronic kidney disease ([name])   Near syncope   Chronic combined systolic and diastolic CHF (congestive heart failure) ([name])   Coronary artery disease involving native coronary artery of native heart without angina pectoris   Paroxysmal atrial fibrillation ([name])   Hyponatremia   Acquired hypothyroidism   Hyperlipidemia associated with type 2 diabetes mellitus ([name])   Lesion of right native kidney   Hypertension associated with diabetes ([name])   Albert Powell is a 81 y.o. male with medical  history significant for chronic combined systolic and diastolic CHF (EF 16-10%), CAD s/p PCI, PAF on Eliquis, carotid artery disease, PAD, CKD stage IIIb, T2DM, HTN, HLD, hypothyroidism, dementia who is admitted with AKI on CKD stage IIIb and syncopal event.  Assessment and Plan: * Acute kidney injury superimposed on chronic kidney disease stage IIIa ([name]) Creatinine 2.51 on admission compared to baseline 1.9-2.0.  Appears volume depleted. -S/p 250 cc NS bolus -Continue IV NS@75  mL/hour over 10 hours -Strict I/O's -Hold torsemide and spironolactone -Repeat labs in a.m.  Near syncope Syncope versus near syncope, unclear situation as patient does not recall history surrounding the event.  Most likely due to hypovolemia.  Had mild bradycardia on arrival. -Started on gentle IV fluid hydration -Check orthostatic vitals -Keep on telemetry -CT head negative for acute cranial process  Type 2 diabetes mellitus with stage 3b chronic kidney disease ([name]) Hold glipizide, placed on SSI.  Chronic combined systolic and diastolic CHF (congestive heart failure) ([name]) Last EF 25-30% on 05/14/2022.  RHC 07/02/2022 showed mild pulmonary venous hypertension, CardioMEMS placed at that time.  Appears overall volume depleted.  Seen by advanced heart failure team. -Holding torsemide and spironolactone -Continue Coreg -Strict I/O's and daily weights  Hyponatremia Sodium 130, in the setting of hypovolemia.  Continue gentle IV NS overnight as above and repeat labs in AM.  Paroxysmal atrial fibrillation ([name]) Borderline bradycardia on arrival, rate now improved. -Continue Coreg, amiodarone, and Eliquis  Coronary artery disease involving native coronary artery of native heart without angina pectoris Reported atypical chest pressure on arrival, now resolved.  Only minimal troponin elevation.  Evaluated by advanced heart failure team, felt unlikely to be ACS. -Continue Coreg, atorvastatin, Imdur, Eliquis -Of  note, Plavix listed on home meds however it was recommended he did not need to continue this per Dr. Alford Highland previous note since he is on Eliquis  Acquired hypothyroidism Awaiting medication reconciliation  before restarting Synthroid as it is unclear if he is taking 25 mcg or 75 mcg.  Hyperlipidemia associated with type 2 diabetes mellitus ([name]) Continue atorvastatin.  Hypertension associated with diabetes ([name]) BP stable.  Continue hydralazine, Coreg, Imdur.  Holding torsemide and spironolactone.  Lesion of right native kidney Indeterminate lesion of the posterior right kidney measuring 1.4 cm noted on CT renal stone study.  Nonemergent renal protocol MRI recommended for further evaluation.  DVT prophylaxis: apixaban (ELIQUIS) tablet 2.5 mg Start: 09/04/22 2200 apixaban (ELIQUIS) tablet 2.5 mg   Code Status: Full code, confirmed with patient on admission Family Communication: Discussed with patient, he has discussed with family Disposition Plan: From home, dispo pending clinical progress Consults called: Advanced heart failure team Severity of Illness: The appropriate patient status for this patient is OBSERVATION. Observation status is judged to be reasonable and necessary in order to provide the required intensity of service to ensure the patient's safety. The patient's presenting symptoms, physical exam findings, and initial radiographic and laboratory data in the context of their medical condition is felt to place them at decreased risk for further clinical deterioration. Furthermore, it is anticipated that the patient will be medically stable for discharge from the hospital within 2 midnights of admission.   Darreld Mclean MD [facility]  If 7PM-7AM, please contact night-coverage [email or web address]  09/04/2022, 9:14 PM

## 2022-09-05 ENCOUNTER — Observation Stay (HOSPITAL_BASED_OUTPATIENT_CLINIC_OR_DEPARTMENT_OTHER): Payer: Medicare Other

## 2022-09-05 ENCOUNTER — Encounter (HOSPITAL_COMMUNITY): Payer: Medicare Other

## 2022-09-05 DIAGNOSIS — R55 Syncope and collapse: Secondary | ICD-10-CM | POA: Diagnosis not present

## 2022-09-05 DIAGNOSIS — E1159 Type 2 diabetes mellitus with other circulatory complications: Secondary | ICD-10-CM | POA: Diagnosis not present

## 2022-09-05 DIAGNOSIS — N179 Acute kidney failure, unspecified: Secondary | ICD-10-CM | POA: Diagnosis not present

## 2022-09-05 DIAGNOSIS — R0789 Other chest pain: Secondary | ICD-10-CM

## 2022-09-05 DIAGNOSIS — N1832 Chronic kidney disease, stage 3b: Secondary | ICD-10-CM

## 2022-09-05 DIAGNOSIS — N189 Chronic kidney disease, unspecified: Secondary | ICD-10-CM | POA: Diagnosis not present

## 2022-09-05 DIAGNOSIS — E785 Hyperlipidemia, unspecified: Secondary | ICD-10-CM | POA: Diagnosis not present

## 2022-09-05 DIAGNOSIS — I152 Hypertension secondary to endocrine disorders: Secondary | ICD-10-CM | POA: Diagnosis not present

## 2022-09-05 DIAGNOSIS — E1169 Type 2 diabetes mellitus with other specified complication: Secondary | ICD-10-CM

## 2022-09-05 DIAGNOSIS — N289 Disorder of kidney and ureter, unspecified: Secondary | ICD-10-CM | POA: Diagnosis not present

## 2022-09-05 DIAGNOSIS — E1122 Type 2 diabetes mellitus with diabetic chronic kidney disease: Secondary | ICD-10-CM

## 2022-09-05 DIAGNOSIS — I480 Paroxysmal atrial fibrillation: Secondary | ICD-10-CM | POA: Diagnosis not present

## 2022-09-05 DIAGNOSIS — E039 Hypothyroidism, unspecified: Secondary | ICD-10-CM | POA: Diagnosis not present

## 2022-09-05 LAB — BASIC METABOLIC PANEL
Anion gap: 14 (ref 5–15)
BUN: 22 mg/dL (ref 8–23)
CO2: 24 mmol/L (ref 22–32)
Calcium: 8.5 mg/dL — ABNORMAL LOW (ref 8.9–10.3)
Chloride: 96 mmol/L — ABNORMAL LOW (ref 98–111)
Creatinine, Ser: 2.13 mg/dL — ABNORMAL HIGH (ref 0.61–1.24)
GFR, Estimated: 31 mL/min — ABNORMAL LOW (ref >60)
Glucose, Bld: 148 mg/dL — ABNORMAL HIGH (ref 70–99)
Potassium: 2.8 mmol/L — ABNORMAL LOW (ref 3.5–5.1)
Sodium: 134 mmol/L — ABNORMAL LOW (ref 135–145)

## 2022-09-05 LAB — CBC
HCT: 39 % (ref 39.0–52.0)
Hemoglobin: 13.3 g/dL (ref 13.0–17.0)
MCH: 29.8 pg (ref 26.0–34.0)
MCHC: 34.1 g/dL (ref 30.0–36.0)
MCV: 87.2 fL (ref 80.0–100.0)
Platelets: 246 10*3/uL (ref 150–400)
RBC: 4.47 MIL/uL (ref 4.22–5.81)
RDW: 14 % (ref 11.5–15.5)
WBC: 8.7 10*3/uL (ref 4.0–10.5)
nRBC: 0 % (ref 0.0–0.2)

## 2022-09-05 LAB — URINE CULTURE: Culture: NO GROWTH

## 2022-09-05 LAB — ECHOCARDIOGRAM COMPLETE
Area-P 1/2: 2.77 cm2
Weight: 3148.8 oz

## 2022-09-05 LAB — GLUCOSE, CAPILLARY
Glucose-Capillary: 126 mg/dL — ABNORMAL HIGH (ref 70–99)
Glucose-Capillary: 198 mg/dL — ABNORMAL HIGH (ref 70–99)

## 2022-09-05 LAB — CBG MONITORING, ED
Glucose-Capillary: 126 mg/dL — ABNORMAL HIGH (ref 70–99)
Glucose-Capillary: 129 mg/dL — ABNORMAL HIGH (ref 70–99)

## 2022-09-05 MED ORDER — CARVEDILOL 12.5 MG PO TABS
12.5000 mg | ORAL_TABLET | Freq: Two times a day (BID) | ORAL | Status: DC
  Administered 2022-09-05: 12.5 mg via ORAL
  Filled 2022-09-05: qty 1

## 2022-09-05 MED ORDER — POTASSIUM CHLORIDE CRYS ER 20 MEQ PO TBCR
40.0000 meq | EXTENDED_RELEASE_TABLET | Freq: Once | ORAL | Status: AC
  Administered 2022-09-05: 40 meq via ORAL
  Filled 2022-09-05: qty 2

## 2022-09-05 MED ORDER — AMIODARONE HCL 200 MG PO TABS
200.0000 mg | ORAL_TABLET | Freq: Every day | ORAL | Status: DC
  Administered 2022-09-05 – 2022-09-07 (×3): 200 mg via ORAL
  Filled 2022-09-05 (×3): qty 1

## 2022-09-05 NOTE — Care Management Obs Status (Signed)
MEDICARE OBSERVATION STATUS NOTIFICATION   Patient Details  Name: Albert Powell MRN: [id number] Date of Birth: June 03, 1941   Medicare Observation Status Notification Given:   yes    Michel Bickers, RN 09/05/2022, 9:26 PM

## 2022-09-05 NOTE — ED Notes (Signed)
ED TO INPATIENT HANDOFF REPORT   S Name/Age/Gender Albert Powell 81 y.o. male Room/Bed: 007C/007C  Code Status   Code Status: Full Code  Home/SNF/Other Home Patient oriented to: self, place, time, and situation Is this baseline? Yes   Triage Complete: Triage complete  Chief Complaint Acute kidney injury superimposed on chronic kidney disease ([place]) [N17.9, N18.9]  Triage Note Patient BIB [facility] from home for home for evaluation of chest pain and near-syncope. Patient had a near-syncopal episode once while in a bathtub and then again while standing after being assisted out of the bath tub. Per EMS, patient's primary care giver in his apartment is his wife who has dementia. Patient received 324 mg ASA, 4mg  zofran, 2x SL NTG with no reduction in chest pain, and 500 mL. 18g saline lock in right AC. Patient is alert and in no apparent distress at this time.  HR 40    Allergies Allergies  Allergen Reactions   Fentanyl Other (See Comments)    Behavioral changes   Gabapentin Swelling   Lisinopril Other (See Comments)    Unknown reaction   Lyrica [Pregabalin] Other (See Comments)    Unknown reaction   Metformin Diarrhea   Propofol Other (See Comments)    Heart rate dropped    Level of Care/Admitting Diagnosis ED Disposition     ED Disposition  Admit   Condition  --   Comment  Hospital Area: [place] [id number]  Level of Care: Telemetry Cardiac [[id number]]  May place patient in observation at [facility] or [place] if equivalent level of care is available:: No  Covid Evaluation: Asymptomatic - no recent exposure (last 10 days) testing not required  Diagnosis: Acute kidney injury superimposed on chronic kidney disease [id number]  Admitting Physician: Lenore Cordia [id number]  Attending Physician: Lenore Cordia [id number]          B Medical/Surgery History Past Medical History:  Diagnosis Date   Atrial fibrillation ([place]) 03/30/2020    Bradycardia, sinus 02/18/2013   CAD (coronary artery disease)    stent to RCA 2003 also had 40% lesion at that time; NUCLEAR STRESS TEST, 01/16/2010 - normal  now with cath 80-90% stenosis in LAD, will try medical therapy if no improvement PCI   Carotid artery disease ([place])    Cataract    CHF (congestive heart failure) ([place])    Chronic kidney disease (CKD), stage III (moderate) ([place])    Claudication ([place])    LEA DUPLEX, 07/07/2008 - Normal   COPD (chronic obstructive pulmonary disease) ([place])    DDD (degenerative disc disease) 2008   Diabetes mellitus    GERD (gastroesophageal reflux disease)    H/O hiatal hernia    History of colonoscopy 2004   finding of tics and AMV only no polpys   Hyperlipidemia 02/05/2013   Hypertension    Hypothyroidism    Obesity    Onychomycosis    PAD (peripheral artery disease) ([place])    Rosacea    Tobacco abuse 02/05/2013   Past Surgical History:  Procedure Laterality Date   ABDOMINAL AORTOGRAM W/LOWER EXTREMITY N/A 03/30/2018   Procedure: ABDOMINAL AORTOGRAM W/LOWER EXTREMITY;  Surgeon: Serafina Mitchell, MD;  Location: [place] CV LAB;  Service: Cardiovascular;  Laterality: N/A;   ABDOMINAL AORTOGRAM W/LOWER EXTREMITY Bilateral 08/27/2020   Procedure: ABDOMINAL AORTOGRAM W/LOWER EXTREMITY;  Surgeon: Lorretta Harp, MD;  Location: [place] CV LAB;  Service: Cardiovascular;  Laterality: Bilateral;   APPENDECTOMY  CARDIAC CATHETERIZATION  08/29/2002   2-vessel CAD with high-grade stenoses in RCA; stenting to RCA   CARDIAC CATHETERIZATION  2014   80-90% lesion will try medical therapy   CARDIAC SURGERY     stents  2003   CARDIOVERSION N/A 11/01/2013   Procedure: [place] COMPRESSION;  Surgeon: [name], MD;  Location: [facility] CATH LAB;  Service: Cardiovascular;  Laterality: N/A;   CAROTID ANGIOGRAM N/A 04/25/2013   Procedure: CAROTID ANGIOGRAM;  Surgeon: [name], MD;  Location: [facility] CATH LAB;  Service: Cardiovascular;   Laterality: N/A;   CAROTID ENDARTERECTOMY     COLON RESECTION  3/04, 6/04    1 bleeding diverticulitis, 2 complete colectomy   COLON SURGERY  2005   renal pouch rectal anastimosis   CORONARY ANGIOPLASTY WITH STENT PLACEMENT  09/2002   2 stents to RCA   ENDARTERECTOMY Left 06/02/2013   Procedure: ENDARTERECTOMY CAROTID-LEFT;  Surgeon: Nada Libman, MD;  Location: [facility] OR;  Service: Vascular;  Laterality: Left;   INTRAVASCULAR PRESSURE WIRE/FFR STUDY N/A 04/02/2020   Procedure: INTRAVASCULAR PRESSURE WIRE/FFR STUDY;  Surgeon: [name], MD;  Location: [facility] INVASIVE CV LAB;  Service: Cardiovascular;  Laterality: N/A;  DFR - RCA   LEFT HEART CATH AND CORONARY ANGIOGRAPHY N/A 04/02/2020   Procedure: LEFT HEART CATH AND CORONARY ANGIOGRAPHY;  Surgeon: [name], MD;  Location: [facility] INVASIVE CV LAB;  Service: Cardiovascular;  Laterality: N/A;   LEFT HEART CATHETERIZATION WITH CORONARY ANGIOGRAM N/A 02/08/2013   Procedure: LEFT HEART CATHETERIZATION WITH CORONARY ANGIOGRAM;  Surgeon: [name], MD;  Location: [facility] CATH LAB;  Service: Cardiovascular;  Laterality: N/A;   LOWER EXTREMITY ANGIOGRAM N/A 10/20/2013   Procedure: LOWER EXTREMITY ANGIOGRAM;  Surgeon: [name], MD;  Location: [facility] CATH LAB;  Service: Cardiovascular;  Laterality: N/A;   PATCH ANGIOPLASTY Left 06/02/2013   Procedure: PATCH ANGIOPLASTY;  Surgeon: Nada Libman, MD;  Location: [facility] OR;  Service: Vascular;  Laterality: Left;   PERIPHERAL VASCULAR INTERVENTION Bilateral 03/30/2018   Procedure: PERIPHERAL VASCULAR INTERVENTION;  Surgeon: Nada Libman, MD;  [facility]: [facility] LAB;  Service: Cardiovascular;  Laterality: Bilateral;  common iliacs   PRESSURE SENSOR/CARDIOMEMS N/A 07/02/2022   Procedure: PRESSURE SENSOR/CARDIOMEMS;  Surgeon: Laurey Morale, MD;  Location: [facility] INVASIVE CV LAB;  Service: Cardiovascular;  Laterality: N/A;   RIGHT HEART CATH N/A 07/02/2022   Procedure: RIGHT HEART CATH;  Surgeon:  Laurey Morale, MD;  Location: [facility] INVASIVE CV LAB;  Service: Cardiovascular;  Laterality: N/A;   RIGHT/LEFT HEART CATH AND CORONARY ANGIOGRAPHY N/A 05/20/2022   Procedure: RIGHT/LEFT HEART CATH AND CORONARY ANGIOGRAPHY;  Surgeon: Orbie Pyo, MD;  Location: [facility] INVASIVE CV LAB;  Service: Cardiovascular;  Laterality: N/A;   TONSILLECTOMY       A IV Location/Drains/Wounds Patient Lines/Drains/Airways Status     Active Line/Drains/Airways     Name Placement date Placement time Site Days   Peripheral IV 09/04/22 20 G Anterior;Distal;Right;Upper Arm 09/04/22  1734  Arm  1            Intake/Output Last 24 hours  Intake/Output Summary (Last 24 hours) at 09/05/2022 1206 Last data filed at 09/04/2022 2225 Gross per 24 hour  Intake 250 ml  Output 550 ml  Net -300 ml    Labs/Imaging Results for orders placed or performed during the hospital encounter of 09/04/22 (from the past 48 hour(s))  Basic metabolic panel     Status: Abnormal   Collection  Time: 09/04/22 12:52 PM  Result Value Ref Range   Sodium 130 (L) 135 - 145 mmol/L   Potassium 3.7 3.5 - 5.1 mmol/L   Chloride 92 (L) 98 - 111 mmol/L   CO2 24 22 - 32 mmol/L   Glucose, Bld 158 (H) 70 - 99 mg/dL    Comment: Glucose reference range applies only to samples taken after fasting for at least 8 hours.   BUN 25 (H) 8 - 23 mg/dL   Creatinine, Ser 3.71 (H) 0.61 - 1.24 mg/dL   Calcium 9.0 8.9 - 06.2 mg/dL   GFR, Estimated 25 (L) >60 mL/min    Comment: (NOTE) Calculated using the CKD-EPI Creatinine Equation (2021)    Anion gap 14 5 - 15    Comment: Performed at [facility] Lab, [address] [place], [place] [address]  CBC     Status: Abnormal   Collection Time: 09/04/22 12:52 PM  Result Value Ref Range   WBC 12.3 (H) 4.0 - 10.5 K/uL   RBC 4.78 4.22 - 5.81 MIL/uL   Hemoglobin 13.9 13.0 - 17.0 g/dL   HCT 46.2 70.3 - 50.0 %   MCV 87.2 80.0 - 100.0 fL   MCH 29.1 26.0 - 34.0 pg   MCHC 33.3 30.0 - 36.0 g/dL   RDW 93.8  18.2 - 99.3 %   Platelets 302 150 - 400 K/uL   nRBC 0.0 0.0 - 0.2 %    Comment: Performed at [facility] Lab, [address], [place], [place] [address]  Troponin I (High Sensitivity)     Status: Abnormal   Collection Time: 09/04/22 12:52 PM  Result Value Ref Range   Troponin I (High Sensitivity) 26 (H) <18 ng/L    Comment: (NOTE) Elevated high sensitivity troponin I (hsTnI) values and significant  changes across serial measurements may suggest ACS but many other  chronic and acute conditions are known to elevate hsTnI results.  Refer to the "Links" section for chest pain algorithms and additional  guidance. Performed at [facility] Lab, [address], [place], [place] [address]   Lactic acid, plasma     Status: Abnormal   Collection Time: 09/04/22 12:52 PM  Result Value Ref Range   Lactic Acid, Venous 4.1 (HH) 0.5 - 1.9 mmol/L    Comment: CRITICAL RESULT CALLED TO, READ BACK BY AND VERIFIED WITH A.ROSS,RN 1411 09/04/22 CLARK,S Performed at [facility] Lab, [address] [place], [place] [address]   Hepatic function panel     Status: Abnormal   Collection Time: 09/04/22 12:52 PM  Result Value Ref Range   Total Protein 6.2 (L) 6.5 - 8.1 g/dL   Albumin 3.5 3.5 - 5.0 g/dL   AST 23 15 - 41 U/L   ALT 23 0 - 44 U/L   Alkaline Phosphatase 76 38 - 126 U/L   Total Bilirubin 1.1 0.3 - 1.2 mg/dL   Bilirubin, Direct 0.2 0.0 - 0.2 mg/dL   Indirect Bilirubin 0.9 0.3 - 0.9 mg/dL    Comment: Performed at [facility] Lab, [address] [place], [place] [address]  Brain natriuretic peptide     Status: Abnormal   Collection Time: 09/04/22 12:52 PM  Result Value Ref Range   B Natriuretic Peptide 226.5 (H) 0.0 - 100.0 pg/mL    Comment: Performed at [facility], [address], [place], [place] [address]  Troponin I (High Sensitivity)     Status: Abnormal   Collection Time:  09/04/22  2:06 PM  Result Value Ref Range   Troponin I (High Sensitivity) 21 (H) <18 ng/L     Comment: (NOTE) Elevated high sensitivity troponin I (hsTnI) values and significant  changes across serial measurements may suggest ACS but many other  chronic and acute conditions are known to elevate hsTnI results.  Refer to the "Links" section for chest pain algorithms and additional  guidance. Performed at [facility], [address] [place], [place] [address]   Lactic acid, plasma     Status: None   Collection Time: 09/04/22  3:34 PM  Result Value Ref Range   Lactic Acid, Venous 1.6 0.5 - 1.9 mmol/L    Comment: Performed at [facility] Lab, [address] [place], [place] [address]  Urinalysis, Routine w reflex microscopic     Status: Abnormal   Collection Time: 09/04/22  5:07 PM  Result Value Ref Range   Color, Urine YELLOW YELLOW   APPearance CLEAR CLEAR   Specific Gravity, Urine 1.005 1.005 - 1.030   pH 6.0 5.0 - 8.0   Glucose, UA NEGATIVE NEGATIVE mg/dL   Hgb urine dipstick NEGATIVE NEGATIVE   Bilirubin Urine NEGATIVE NEGATIVE   Ketones, ur NEGATIVE NEGATIVE mg/dL   Protein, ur 220 (A) NEGATIVE mg/dL   Nitrite NEGATIVE NEGATIVE   Leukocytes,Ua NEGATIVE NEGATIVE   RBC / HPF 0-5 0 - 5 RBC/hpf   WBC, UA 0-5 0 - 5 WBC/hpf   Bacteria, UA NONE SEEN NONE SEEN   Squamous Epithelial / LPF 0-5 0 - 5   Mucus PRESENT    Hyaline Casts, UA PRESENT     Comment: Performed at [facility] Lab, [address], [place], [place] [address]  CBG monitoring, ED     Status: Abnormal   Collection Time: 09/04/22 10:30 PM  Result Value Ref Range   Glucose-Capillary 151 (H) 70 - 99 mg/dL    Comment: Glucose reference range applies only to samples taken after fasting for at least 8 hours.  CBC     Status: None   Collection Time: 09/05/22 12:54 AM  Result Value Ref Range   WBC 8.7 4.0 - 10.5 K/uL   RBC 4.47 4.22 - 5.81 MIL/uL   Hemoglobin 13.3 13.0 - 17.0 g/dL   HCT 06.2 37.6 - 28.3 %   MCV 87.2 80.0 - 100.0 fL   MCH 29.8 26.0 - 34.0 pg   MCHC 34.1 30.0 - 36.0 g/dL   RDW  15.1 76.1 - 60.7 %   Platelets 246 150 - 400 K/uL   nRBC 0.0 0.0 - 0.2 %    Comment: Performed at [facility] Lab, [address], [place], [place] [address]  Basic metabolic panel     Status: Abnormal   Collection Time: 09/05/22 12:54 AM  Result Value Ref Range   Sodium 134 (L) 135 - 145 mmol/L   Potassium 2.8 (L) 3.5 - 5.1 mmol/L   Chloride 96 (L) 98 - 111 mmol/L   CO2 24 22 - 32 mmol/L   Glucose, Bld 148 (H) 70 - 99 mg/dL    Comment: Glucose reference range applies only to samples taken after fasting for at least 8 hours.   BUN 22 8 - 23 mg/dL   Creatinine, Ser 2.69 (H) 0.61 - 1.24 mg/dL   Calcium 8.5 (L) 8.9 - 10.3 mg/dL   GFR, Estimated 31 (L) >60 mL/min    Comment: (NOTE) Calculated using the CKD-EPI Creatinine Equation (2021)  Anion gap 14 5 - 15    Comment: Performed at [facility] Lab, [address], [place], [place] [address]  CBG monitoring, ED     Status: Abnormal   Collection Time: 09/05/22  7:55 AM  Result Value Ref Range   Glucose-Capillary 126 (H) 70 - 99 mg/dL    Comment: Glucose reference range applies only to samples taken after fasting for at least 8 hours.   CT Renal Stone Study  Result Date: 09/04/2022 CLINICAL DATA:  Flank pain EXAM: CT ABDOMEN AND PELVIS WITHOUT CONTRAST TECHNIQUE: Multidetector CT imaging of the abdomen and pelvis was performed following the standard protocol without IV contrast. RADIATION DOSE REDUCTION: This exam was performed according to the departmental dose-optimization program which includes automated exposure control, adjustment of the mA and/or kV according to patient size and/or use of iterative reconstruction technique. COMPARISON:  CT abdomen and pelvis dated May 28, 2011 FINDINGS: Lower chest: Coronary artery calcifications. Small to moderate hiatal hernia. No acute abnormality. Hepatobiliary: No focal liver abnormality is seen. Intraluminal hyperdensity seen within the gallbladder, likely due to sludge. No  gallstones, gallbladder wall thickening, or biliary dilatation. Pancreas: Unremarkable. No pancreatic ductal dilatation or surrounding inflammatory changes. Spleen: Normal in size without focal abnormality. Adrenals/Urinary Tract: Low-attenuation nodule of the left adrenal gland measuring 1.4 cm on series 3, image 24, unchanged when compared with prior and compatible with benign adenoma, no further follow-up imaging is recommended. Right adrenal gland is unremarkable. No hydronephrosis. Atrophic left kidney, new when compared with the prior. Renal vascular calcifications with no definite nephrolithiasis. Bilateral renal lesions of varying complexity, most are compatible with simple or proteinaceous cysts, although there is an indeterminate lesion of the posterior right kidney measuring 1.4 cm on series 3, image 28, not definitely present on prior exam. Bladder is unremarkable. Stomach/Bowel: Stomach is within normal limits. Prior partial colectomy. No evidence of bowel wall thickening, distention, or inflammatory changes. Vascular/Lymphatic: Severe aortic atherosclerosis. Bilateral iliac artery stents. No enlarged abdominal or pelvic lymph nodes. Reproductive: Unchanged mild prostatomegaly Other: Complex ventral abdominal wall hernia which contains fat and a nondilated loop of small bowel a component located right of midline. No abdominopelvic ascites. Musculoskeletal: No acute or significant osseous findings. IMPRESSION: 1. No acute findings in the abdomen or pelvis, including no evidence of obstructive uropathy. 2. Complex ventral abdominal wall hernia which contains a nondilated loop of small bowel. 3. Indeterminate lesion of the posterior right kidney measuring 1.4 cm. Recommend further evaluation with renal protocol MRI, this can be performed non emergently. 4. Aortic Atherosclerosis (ICD10-I70.0). Electronically Signed   By: Allegra Lai M.D.   On: 09/04/2022 17:59   CT Head Wo Contrast  Result Date:  09/04/2022 CLINICAL DATA:  Head trauma EXAM: CT HEAD WITHOUT CONTRAST TECHNIQUE: Contiguous axial images were obtained from the base of the skull through the vertex without intravenous contrast. RADIATION DOSE REDUCTION: This exam was performed according to the departmental dose-optimization program which includes automated exposure control, adjustment of the mA and/or kV according to patient size and/or use of iterative reconstruction technique. COMPARISON:  CT head 06/08/2021 FINDINGS: Brain: No evidence of acute infarction, hemorrhage, hydrocephalus, extra-axial collection or mass lesion/mass effect. Again seen is mild diffuse atrophy. There is mild periventricular white matter hypodensity, likely chronic small vessel ischemic change. There is a punctate cortical calcification in the right temporal region which likely represents dystrophic calcification, new from prior. Vascular: Atherosclerotic calcifications are present within the cavernous internal carotid arteries. Skull: Normal. Negative  for fracture or focal lesion. Sinuses/Orbits: No acute finding. Other: None. IMPRESSION: 1. No acute intracranial process. 2. Mild atrophy and chronic small vessel ischemic change. Electronically Signed   By: Ronney Asters M.D.   On: 09/04/2022 17:50   DG Chest 2 View  Result Date: 09/04/2022 CLINICAL DATA:  Chest pain and weakness EXAM: CHEST - 2 VIEW COMPARISON:  Chest 06/23/2022 FINDINGS: Heart size within normal limits. Negative for heart failure. Lungs clear without infiltrate or effusion Spinal cord stimulator midthoracic spine unchanged Foreign body in the left lower lobe likely in the pulmonary artery. This was not present previously. Foreign body measures approximately 3 x 12 mm. IMPRESSION: 1. No active cardiopulmonary disease. 2. Foreign body in the left lower lobe likely in the pulmonary artery. Electronically Signed   By: Franchot Gallo M.D.   On: 09/04/2022 13:11    Pending Labs Unresulted Labs (From  admission, onward)     Start     Ordered   09/04/22 1506  Urine Culture  (Urine Culture)  Once,   URGENT       Question:  Indication  Answer:  Sepsis   09/04/22 1505            Vitals/Pain Today's Vitals   09/05/22 0730 09/05/22 0800 09/05/22 1017 09/05/22 1100  BP: (!) 177/72 (!) 174/70  (!) 158/61  Pulse: 62 62  62  Resp: (!) 26 14  15   Temp:   99.1 F (37.3 C)   TempSrc:   Oral   SpO2: 95% 97%  96%  PainSc:        Isolation Precautions No active isolations  Medications Medications  sodium chloride flush (NS) 0.9 % injection 3 mL (3 mLs Intravenous Given 09/05/22 0935)  0.9 %  sodium chloride infusion (0 mLs Intravenous Stopped 09/05/22 0720)  acetaminophen (TYLENOL) tablet 650 mg (has no administration in time range)    Or  acetaminophen (TYLENOL) suppository 650 mg (has no administration in time range)  ondansetron (ZOFRAN) tablet 4 mg (has no administration in time range)    Or  ondansetron (ZOFRAN) injection 4 mg (has no administration in time range)  senna-docusate (Senokot-S) tablet 1 tablet (has no administration in time range)  insulin aspart (novoLOG) injection 0-9 Units (1 Units Subcutaneous Given 09/05/22 0934)  amLODipine (NORVASC) tablet 10 mg (10 mg Oral Given 09/05/22 0930)  apixaban (ELIQUIS) tablet 2.5 mg (2.5 mg Oral Given 09/05/22 0932)  atorvastatin (LIPITOR) tablet 40 mg (40 mg Oral Given 09/05/22 0932)  isosorbide mononitrate (IMDUR) 24 hr tablet 120 mg (120 mg Oral Given 09/05/22 0931)  hydrALAZINE (APRESOLINE) tablet 100 mg (100 mg Oral Given 09/05/22 0614)  carvedilol (COREG) tablet 25 mg (25 mg Oral Given 09/05/22 0931)  hydrALAZINE (APRESOLINE) injection 10 mg (has no administration in time range)  amiodarone (PACERONE) tablet 200 mg (200 mg Oral Given 09/05/22 0932)  potassium chloride SA (KLOR-CON M) CR tablet 40 mEq (has no administration in time range)  sodium chloride 0.9 % bolus 250 mL (0 mLs Intravenous Stopped 09/04/22 1927)   potassium chloride SA (KLOR-CON M) CR tablet 40 mEq (40 mEq Oral Given 09/05/22 0932)    Mobility walks with device Low fall risk   Focused Assessments Cardiac Assessment Handoff:  Cardiac Rhythm: Atrial fibrillation Lab Results  Component Value Date   TROPONINI <0.30 02/05/2013   Lab Results  Component Value Date   DDIMER 0.86 (H) 06/24/2022   Does the Patient currently have chest pain? No    R  Recommendations: See Admitting Provider Note  Report given to:

## 2022-09-05 NOTE — ED Notes (Signed)
ED TO INPATIENT HANDOFF REPORT  ED Nurse Name and Phone #: [phone]  S Name/Age/Gender Albert Powell 81 y.o. male Room/Bed: 007C/007C  Code Status   Code Status: Full Code  Home/SNF/Other Home Patient oriented to: self, place, time, and situation Is this baseline? Yes   Triage Complete: Triage complete  Chief Complaint Acute kidney injury superimposed on chronic kidney disease ([name]) [N17.9, N18.9]  Triage Note Patient BIB [facility] from home for home for evaluation of chest pain and near-syncope. Patient had a near-syncopal episode once while in a bathtub and then again while standing after being assisted out of the bath tub. Per EMS, patient's primary care giver in his apartment is his wife who has dementia. Patient received 324 mg ASA, 4mg  zofran, 2x SL NTG with no reduction in chest pain, and 500 mL. 18g saline lock in right AC. Patient is alert and in no apparent distress at this time.  HR 40    Allergies Allergies  Allergen Reactions   Fentanyl Other (See Comments)    Behavioral changes   Gabapentin Swelling   Lisinopril Other (See Comments)    Unknown reaction   Lyrica [Pregabalin] Other (See Comments)    Unknown reaction   Metformin Diarrhea   Propofol Other (See Comments)    Heart rate dropped    Level of Care/Admitting Diagnosis ED Disposition     ED Disposition  Admit   Condition  --   Comment  Hospital Area: [name] [[id number]]  Level of Care: Telemetry Cardiac [[id number]]  May place patient in observation at [facility] or [facility] if equivalent level of care is available:: No  Covid Evaluation: Asymptomatic - no recent exposure (last 10 days) testing not required  Diagnosis: Acute kidney injury superimposed on chronic kidney disease [facility]  Admitting Physician: ) [[id number]] Charlsie Quest  Attending Physician: [[id number]] Charlsie Quest          B Medical/Surgery History Past Medical History:  Diagnosis Date   Atrial  fibrillation ([name]) 03/30/2020   Bradycardia, sinus 02/18/2013   CAD (coronary artery disease)    stent to RCA 2003 also had 40% lesion at that time; NUCLEAR STRESS TEST, 01/16/2010 - normal  now with cath 80-90% stenosis in LAD, will try medical therapy if no improvement PCI   Carotid artery disease ([name])    Cataract    CHF (congestive heart failure) ([name])    Chronic kidney disease (CKD), stage III (moderate) ([name])    Claudication ([name])    LEA DUPLEX, 07/07/2008 - Normal   COPD (chronic obstructive pulmonary disease) ([name])    DDD (degenerative disc disease) 2008   Diabetes mellitus    GERD (gastroesophageal reflux disease)    H/O hiatal hernia    History of colonoscopy 2004   finding of tics and AMV only no polpys   Hyperlipidemia 02/05/2013   Hypertension    Hypothyroidism    Obesity    Onychomycosis    PAD (peripheral artery disease) ([name])    Rosacea    Tobacco abuse 02/05/2013   Past Surgical History:  Procedure Laterality Date   ABDOMINAL AORTOGRAM W/LOWER EXTREMITY N/A 03/30/2018   Procedure: ABDOMINAL AORTOGRAM W/LOWER EXTREMITY;  Surgeon: 04/01/2018, MD;  Location: [facility] CV LAB;  Service: Cardiovascular;  Laterality: N/A;   ABDOMINAL AORTOGRAM W/LOWER EXTREMITY Bilateral 08/27/2020   Procedure: ABDOMINAL AORTOGRAM W/LOWER EXTREMITY;  Surgeon: 08/29/2020, MD;  Location: [facility]VAS[facility] CV LAB;  Service: Cardiovascular;  Laterality: Bilateral;  APPENDECTOMY     CARDIAC CATHETERIZATION  08/29/2002   2-vessel CAD with high-grade stenoses in RCA; stenting to RCA   CARDIAC CATHETERIZATION  2014   80-90% lesion will try medical therapy   CARDIAC SURGERY     stents  2003   CARDIOVERSION N/A 11/01/2013   Procedure: [place] COMPRESSION;  Surgeon: Lorretta Harp, MD;  Location[facility] CATH LAB;  Service: Cardiovascular;  Laterality: N/A;   CAROTID ANGIOGRAM N/A 04/25/2013   Procedure: CAROTID ANGIOGRAM;  Surgeon: Lorretta Harp, MD;  Location: [facility] CATH LAB;   Service: Cardiovascular;  Laterality: N/A;   CAROTID ENDARTERECTOMY     COLON RESECTION  3/04, 6/04    1 bleeding diverticulitis, 2 complete colectomy   COLON SURGERY  2005   renal pouch rectal anastimosis   CORONARY ANGIOPLASTY WITH STENT PLACEMENT  09/2002   2 stents to RCA   ENDARTERECTOMY Left 06/02/2013   Procedure: ENDARTERECTOMY CAROTID-LEFT;  Surgeon: Serafina Mitchell, MD;  Location: [place];  Service: Vascular;  Laterality: Left;   INTRAVASCULAR PRESSURE WIRE/FFR STUDY N/A 04/02/2020   Procedure: INTRAVASCULAR PRESSURE WIRE/FFR STUDY;  Surgeon: Lorretta Harp, MD;  Location: [place] CV LAB;  Service: Cardiovascular;  Laterality: N/A;  DFR - RCA   LEFT HEART CATH AND CORONARY ANGIOGRAPHY N/A 04/02/2020   Procedure: LEFT HEART CATH AND CORONARY ANGIOGRAPHY;  Surgeon: Lorretta Harp, MD;  Location: [place] CV LAB;  Service: Cardiovascular;  Laterality: N/A;   LEFT HEART CATHETERIZATION WITH CORONARY ANGIOGRAM N/A 02/08/2013   Procedure: LEFT HEART CATHETERIZATION WITH CORONARY ANGIOGRAM;  Surgeon: Lorretta Harp, MD;  Location: [facility] CATH LAB;  Service: Cardiovascular;  Laterality: N/A;   LOWER EXTREMITY ANGIOGRAM N/A 10/20/2013   Procedure: LOWER EXTREMITY ANGIOGRAM;  Surgeon: Lorretta Harp, MD;  Location: [facility] CATH LAB;  Service: Cardiovascular;  Laterality: N/A;   PATCH ANGIOPLASTY Left 06/02/2013   Procedure: PATCH ANGIOPLASTY;  Surgeon: Serafina Mitchell, MD;  Location: [facility] OR;  Service: Vascular;  Laterality: Left;   PERIPHERAL VASCULAR INTERVENTION Bilateral 03/30/2018   Procedure: PERIPHERAL VASCULAR INTERVENTION;  Surgeon: Serafina Mitchell, MD;  Location: [place] CV LAB;  Service: Cardiovascular;  Laterality: Bilateral;  common iliacs   PRESSURE SENSOR/CARDIOMEMS N/A 07/02/2022   Procedure: PRESSURE SENSOR/CARDIOMEMS;  Surgeon: Larey Dresser, MD;  Location: [place] CV LAB;  Service: Cardiovascular;  Laterality: N/A;   RIGHT HEART CATH N/A 07/02/2022   Procedure:  RIGHT HEART CATH;  Surgeon: Larey Dresser, MD;  Location: [place] CV LAB;  Service: Cardiovascular;  Laterality: N/A;   RIGHT/LEFT HEART CATH AND CORONARY ANGIOGRAPHY N/A 05/20/2022   Procedure: RIGHT/LEFT HEART CATH AND CORONARY ANGIOGRAPHY;  Surgeon: Early Osmond, MD;  Location: [place] CV LAB;  Service: Cardiovascular;  Laterality: N/A;   TONSILLECTOMY       A IV Location/Drains/Wounds Patient Lines/Drains/Airways Status     Active Line/Drains/Airways     Name Placement date Placement time Site Days   Peripheral IV 09/04/22 20 G Anterior;Distal;Right;Upper Arm 09/04/22  1734  Arm  1            Intake/Output Last 24 hours  Intake/Output Summary (Last 24 hours) at 09/05/2022 1311 Last data filed at 09/04/2022 2225 Gross per 24 hour  Intake 250 ml  Output 550 ml  Net -300 ml    Labs/Imaging Results for orders placed or performed during the hospital encounter of 09/04/22 (from the past 48 hour(s))  Basic metabolic panel  Status: Abnormal   Collection Time: 09/04/22 12:52 PM  Result Value Ref Range   Sodium 130 (L) 135 - 145 mmol/L   Potassium 3.7 3.5 - 5.1 mmol/L   Chloride 92 (L) 98 - 111 mmol/L   CO2 24 22 - 32 mmol/L   Glucose, Bld 158 (H) 70 - 99 mg/dL    Comment: Glucose reference range applies only to samples taken after fasting for at least 8 hours.   BUN 25 (H) 8 - 23 mg/dL   Creatinine, Ser 5.36 (H) 0.61 - 1.24 mg/dL   Calcium 9.0 8.9 - 14.4 mg/dL   GFR, Estimated 25 (L) >60 mL/min    Comment: (NOTE) Calculated using the CKD-EPI Creatinine Equation (2021)    Anion gap 14 5 - 15    Comment: Performed at [facility], [address], [place], [place] [address]  CBC     Status: Abnormal   Collection Time: 09/04/22 12:52 PM  Result Value Ref Range   WBC 12.3 (H) 4.0 - 10.5 K/uL   RBC 4.78 4.22 - 5.81 MIL/uL   Hemoglobin 13.9 13.0 - 17.0 g/dL   HCT 08.6 76.1 - 95.0 %   MCV 87.2 80.0 - 100.0 fL   MCH 29.1 26.0 - 34.0 pg   MCHC 33.3  30.0 - 36.0 g/dL   RDW 93.2 67.1 - 24.5 %   Platelets 302 150 - 400 K/uL   nRBC 0.0 0.0 - 0.2 %    Comment: Performed at [facility], [address] [place], [place] [address]  Troponin I (High Sensitivity)     Status: Abnormal   Collection Time: 09/04/22 12:52 PM  Result Value Ref Range   Troponin I (High Sensitivity) 26 (H) <18 ng/L    Comment: (NOTE) Elevated high sensitivity troponin I (hsTnI) values and significant  changes across serial measurements may suggest ACS but many other  chronic and acute conditions are known to elevate hsTnI results.  Refer to the "Links" section for chest pain algorithms and additional  guidance. Performed at [facility], [address] [place], [place] [address]   Lactic acid, plasma     Status: Abnormal   Collection Time: 09/04/22 12:52 PM  Result Value Ref Range   Lactic Acid, Venous 4.1 (HH) 0.5 - 1.9 mmol/L    Comment: CRITICAL RESULT CALLED TO, READ BACK BY AND VERIFIED WITH A.ROSS,RN 1411 09/04/22 CLARK,S Performed at [facility], [address], [place], [place] [address]   Hepatic function panel     Status: Abnormal   Collection Time: 09/04/22 12:52 PM  Result Value Ref Range   Total Protein 6.2 (L) 6.5 - 8.1 g/dL   Albumin 3.5 3.5 - 5.0 g/dL   AST 23 15 - 41 U/L   ALT 23 0 - 44 U/L   Alkaline Phosphatase 76 38 - 126 U/L   Total Bilirubin 1.1 0.3 - 1.2 mg/dL   Bilirubin, Direct 0.2 0.0 - 0.2 mg/dL   Indirect Bilirubin 0.9 0.3 - 0.9 mg/dL    Comment: Performed at [facility], [address], [place], [place] [address]  Brain natriuretic peptide     Status: Abnormal   Collection Time: 09/04/22 12:52 PM  Result Value Ref Range   B Natriuretic Peptide 226.5 (H) 0.0 - 100.0 pg/mL    Comment: Performed at [facility], [address] [place], [place] [address]  Troponin I (High Sensitivity)     Status:  Abnormal   Collection Time: 09/04/22  2:06 PM  Result Value Ref Range   Troponin I (High  Sensitivity) 21 (H) <18 ng/L    Comment: (NOTE) Elevated high sensitivity troponin I (hsTnI) values and significant  changes across serial measurements may suggest ACS but many other  chronic and acute conditions are known to elevate hsTnI results.  Refer to the "Links" section for chest pain algorithms and additional  guidance. Performed at [facility], [address] [place], [place] [address]   Lactic acid, plasma     Status: None   Collection Time: 09/04/22  3:34 PM  Result Value Ref Range   Lactic Acid, Venous 1.6 0.5 - 1.9 mmol/L    Comment: Performed at [facility], [address] [place], [place] [address]  Urinalysis, Routine w reflex microscopic     Status: Abnormal   Collection Time: 09/04/22  5:07 PM  Result Value Ref Range   Color, Urine YELLOW YELLOW   APPearance CLEAR CLEAR   Specific Gravity, Urine 1.005 1.005 - 1.030   pH 6.0 5.0 - 8.0   Glucose, UA NEGATIVE NEGATIVE mg/dL   Hgb urine dipstick NEGATIVE NEGATIVE   Bilirubin Urine NEGATIVE NEGATIVE   Ketones, ur NEGATIVE NEGATIVE mg/dL   Protein, ur 875 (A) NEGATIVE mg/dL   Nitrite NEGATIVE NEGATIVE   Leukocytes,Ua NEGATIVE NEGATIVE   RBC / HPF 0-5 0 - 5 RBC/hpf   WBC, UA 0-5 0 - 5 WBC/hpf   Bacteria, UA NONE SEEN NONE SEEN   Squamous Epithelial / LPF 0-5 0 - 5   Mucus PRESENT    Hyaline Casts, UA PRESENT     Comment: Performed at [facility], [address] [place], [place] [address]  Urine Culture     Status: None   Collection Time: 09/04/22  5:09 PM   Specimen: Urine, Clean Catch  Result Value Ref Range   Specimen Description URINE, CLEAN CATCH    Special Requests NONE    Culture      NO GROWTH Performed at [facility], [address] [place], [place] [address]    Report Status 09/05/2022 FINAL   CBG monitoring, ED     Status: Abnormal   Collection Time: 09/04/22 10:30 PM  Result Value Ref Range   Glucose-Capillary 151 (H) 70 - 99 mg/dL    Comment: Glucose reference  range applies only to samples taken after fasting for at least 8 hours.  CBC     Status: None   Collection Time: 09/05/22 12:54 AM  Result Value Ref Range   WBC 8.7 4.0 - 10.5 K/uL   RBC 4.47 4.22 - 5.81 MIL/uL   Hemoglobin 13.3 13.0 - 17.0 g/dL   HCT 41.6 60.6 - 30.1 %   MCV 87.2 80.0 - 100.0 fL   MCH 29.8 26.0 - 34.0 pg   MCHC 34.1 30.0 - 36.0 g/dL   RDW 60.1 09.3 - 23.5 %   Platelets 246 150 - 400 K/uL   nRBC 0.0 0.0 - 0.2 %    Comment: Performed at [facility], [address], [place], [place] [address]  Basic metabolic panel     Status: Abnormal   Collection Time: 09/05/22 12:54 AM  Result Value Ref Range   Sodium 134 (L) 135 - 145 mmol/L   Potassium 2.8 (L) 3.5 - 5.1 mmol/L   Chloride 96 (L) 98 - 111 mmol/L   CO2 24 22 - 32 mmol/L  Glucose, Bld 148 (H) 70 - 99 mg/dL    Comment: Glucose reference range applies only to samples taken after fasting for at least 8 hours.   BUN 22 8 - 23 mg/dL   Creatinine, Ser 2.13 (H) 0.61 - 1.24 mg/dL   Calcium 8.5 (L) 8.9 - 10.3 mg/dL   GFR, Estimated 31 (L) >60 mL/min    Comment: (NOTE) Calculated using the CKD-EPI Creatinine Equation (2021)    Anion gap 14 5 - 15    Comment: Performed at [facility] [address], [place], [place] [address]  CBG monitoring, ED     Status: Abnormal   Collection Time: 09/05/22  7:55 AM  Result Value Ref Range   Glucose-Capillary 126 (H) 70 - 99 mg/dL    Comment: Glucose reference range applies only to samples taken after fasting for at least 8 hours.  CBG monitoring, ED     Status: Abnormal   Collection Time: 09/05/22 12:39 PM  Result Value Ref Range   Glucose-Capillary 129 (H) 70 - 99 mg/dL    Comment: Glucose reference range applies only to samples taken after fasting for at least 8 hours.   CT Renal Stone Study  Result Date: 09/04/2022 CLINICAL DATA:  Flank pain EXAM: CT ABDOMEN AND PELVIS WITHOUT CONTRAST TECHNIQUE: Multidetector CT imaging of the abdomen and pelvis was  performed following the standard protocol without IV contrast. RADIATION DOSE REDUCTION: This exam was performed according to the departmental dose-optimization program which includes automated exposure control, adjustment of the mA and/or kV according to patient size and/or use of iterative reconstruction technique. COMPARISON:  CT abdomen and pelvis dated May 28, 2011 FINDINGS: Lower chest: Coronary artery calcifications. Small to moderate hiatal hernia. No acute abnormality. Hepatobiliary: No focal liver abnormality is seen. Intraluminal hyperdensity seen within the gallbladder, likely due to sludge. No gallstones, gallbladder wall thickening, or biliary dilatation. Pancreas: Unremarkable. No pancreatic ductal dilatation or surrounding inflammatory changes. Spleen: Normal in size without focal abnormality. Adrenals/Urinary Tract: Low-attenuation nodule of the left adrenal gland measuring 1.4 cm on series 3, image 24, unchanged when compared with prior and compatible with benign adenoma, no further follow-up imaging is recommended. Right adrenal gland is unremarkable. No hydronephrosis. Atrophic left kidney, new when compared with the prior. Renal vascular calcifications with no definite nephrolithiasis. Bilateral renal lesions of varying complexity, most are compatible with simple or proteinaceous cysts, although there is an indeterminate lesion of the posterior right kidney measuring 1.4 cm on series 3, image 28, not definitely present on prior exam. Bladder is unremarkable. Stomach/Bowel: Stomach is within normal limits. Prior partial colectomy. No evidence of bowel wall thickening, distention, or inflammatory changes. Vascular/Lymphatic: Severe aortic atherosclerosis. Bilateral iliac artery stents. No enlarged abdominal or pelvic lymph nodes. Reproductive: Unchanged mild prostatomegaly Other: Complex ventral abdominal wall hernia which contains fat and a nondilated loop of small bowel a component located  right of midline. No abdominopelvic ascites. Musculoskeletal: No acute or significant osseous findings. IMPRESSION: 1. No acute findings in the abdomen or pelvis, including no evidence of obstructive uropathy. 2. Complex ventral abdominal wall hernia which contains a nondilated loop of small bowel. 3. Indeterminate lesion of the posterior right kidney measuring 1.4 cm. Recommend further evaluation with renal protocol MRI, this can be performed non emergently. 4. Aortic Atherosclerosis (ICD10-I70.0). Electronically Signed   By: Yetta Glassman M.D.   On: 09/04/2022 17:59   CT Head Wo Contrast  Result Date: 09/04/2022 CLINICAL DATA:  Head trauma EXAM: CT  HEAD WITHOUT CONTRAST TECHNIQUE: Contiguous axial images were obtained from the base of the skull through the vertex without intravenous contrast. RADIATION DOSE REDUCTION: This exam was performed according to the departmental dose-optimization program which includes automated exposure control, adjustment of the mA and/or kV according to patient size and/or use of iterative reconstruction technique. COMPARISON:  CT head 06/08/2021 FINDINGS: Brain: No evidence of acute infarction, hemorrhage, hydrocephalus, extra-axial collection or mass lesion/mass effect. Again seen is mild diffuse atrophy. There is mild periventricular white matter hypodensity, likely chronic small vessel ischemic change. There is a punctate cortical calcification in the right temporal region which likely represents dystrophic calcification, new from prior. Vascular: Atherosclerotic calcifications are present within the cavernous internal carotid arteries. Skull: Normal. Negative for fracture or focal lesion. Sinuses/Orbits: No acute finding. Other: None. IMPRESSION: 1. No acute intracranial process. 2. Mild atrophy and chronic small vessel ischemic change. Electronically Signed   By: Ronney Asters M.D.   On: 09/04/2022 17:50   DG Chest 2 View  Result Date: 09/04/2022 CLINICAL DATA:   Chest pain and weakness EXAM: CHEST - 2 VIEW COMPARISON:  Chest 06/23/2022 FINDINGS: Heart size within normal limits. Negative for heart failure. Lungs clear without infiltrate or effusion Spinal cord stimulator midthoracic spine unchanged Foreign body in the left lower lobe likely in the pulmonary artery. This was not present previously. Foreign body measures approximately 3 x 12 mm. IMPRESSION: 1. No active cardiopulmonary disease. 2. Foreign body in the left lower lobe likely in the pulmonary artery. Electronically Signed   By: Franchot Gallo M.D.   On: 09/04/2022 13:11    Pending Labs Unresulted Labs (From admission, onward)     Start     Ordered   09/06/22 [id number]  Basic metabolic panel  Daily,   R      09/05/22 1249            Vitals/Pain Today's Vitals   09/05/22 0730 09/05/22 0800 09/05/22 1017 09/05/22 1100  BP: (!) 177/72 (!) 174/70  (!) 158/61  Pulse: 62 62  62  Resp: (!) 26 14  15   Temp:   99.1 F (37.3 C)   TempSrc:   Oral   SpO2: 95% 97%  96%  PainSc:        Isolation Precautions No active isolations  Medications Medications  sodium chloride flush (NS) 0.9 % injection 3 mL (3 mLs Intravenous Given 09/05/22 0935)  0.9 %  sodium chloride infusion (0 mLs Intravenous Stopped 09/05/22 0720)  acetaminophen (TYLENOL) tablet 650 mg (has no administration in time range)    Or  acetaminophen (TYLENOL) suppository 650 mg (has no administration in time range)  ondansetron (ZOFRAN) tablet 4 mg (has no administration in time range)    Or  ondansetron (ZOFRAN) injection 4 mg (has no administration in time range)  senna-docusate (Senokot-S) tablet 1 tablet (has no administration in time range)  insulin aspart (novoLOG) injection 0-9 Units (1 Units Subcutaneous Given 09/05/22 1309)  amLODipine (NORVASC) tablet 10 mg (10 mg Oral Given 09/05/22 0930)  apixaban (ELIQUIS) tablet 2.5 mg (2.5 mg Oral Given 09/05/22 0932)  atorvastatin (LIPITOR) tablet 40 mg (40 mg Oral Given 09/05/22  0932)  isosorbide mononitrate (IMDUR) 24 hr tablet 120 mg (120 mg Oral Given 09/05/22 0931)  hydrALAZINE (APRESOLINE) tablet 100 mg (100 mg Oral Given 09/05/22 0614)  carvedilol (COREG) tablet 25 mg (25 mg Oral Given 09/05/22 0931)  hydrALAZINE (APRESOLINE) injection 10 mg (has no administration in time range)  amiodarone (PACERONE) tablet  200 mg (200 mg Oral Given 09/05/22 0932)  potassium chloride SA (KLOR-CON M) CR tablet 40 mEq (has no administration in time range)  sodium chloride 0.9 % bolus 250 mL (0 mLs Intravenous Stopped 09/04/22 1927)  potassium chloride SA (KLOR-CON M) CR tablet 40 mEq (40 mEq Oral Given 09/05/22 0932)    Mobility walks Low fall risk   Focused Assessments Cardiac Assessment Handoff:  Cardiac Rhythm: Atrial fibrillation Lab Results  Component Value Date   TROPONINI <0.30 02/05/2013   Lab Results  Component Value Date   DDIMER 0.86 (H) 06/24/2022   Does the Patient currently have chest pain? No    R Recommendations: See Admitting Provider Note  Report given to:   Additional Notes:

## 2022-09-05 NOTE — Progress Notes (Signed)
  Echocardiogram 2D Echocardiogram has been performed.  Albert Powell 09/05/2022, 2:40 PM

## 2022-09-05 NOTE — Progress Notes (Signed)
Patient ID: Albert Powell, male   DOB: Jan 24, 1941, 81 y.o.   MRN: [id number]     Advanced Heart Failure Rounding Note  PCP-Cardiologist: Nanetta Batty, MD   Subjective:    Patient had IV fluid overnight, now stopped.  SBP 160s this morning.  Feels much better, ate breakfast.  HR 60s, NSR.     Objective:   Weight Range:   There is no height or weight on file to calculate BMI.   Vital Signs:   Temp:  [97.6 F (36.4 C)-98.7 F (37.1 C)] 98.5 F (36.9 C) (12/22 0616) Pulse Rate:  [50-120] 62 (12/22 0800) Resp:  [10-26] 14 (12/22 0800) BP: (100-183)/(40-103) 174/70 (12/22 0800) SpO2:  [89 %-100 %] 97 % (12/22 0800) Weight:  [89.3 kg] 89.3 kg (12/21 0938)    Weight change: There were no vitals filed for this visit.  Intake/Output:   Intake/Output Summary (Last 24 hours) at 09/05/2022 0857 Last data filed at 09/04/2022 2225 Gross per 24 hour  Intake 250 ml  Output 550 ml  Net -300 ml      Physical Exam    General:  Well appearing. No resp difficulty HEENT: Normal Neck: Supple. JVP not elevated. Carotids 2+ bilat; no bruits. No lymphadenopathy or thyromegaly appreciated. Cor: PMI nondisplaced. Regular rate & rhythm. No rubs, gallops or murmurs. Lungs: Clear Abdomen: Soft, nontender, nondistended. No hepatosplenomegaly. No bruits or masses. Good bowel sounds. Extremities: No cyanosis, clubbing, rash, edema. Unable to palpate pedal pulses.  Neuro: Alert & orientedx3, cranial nerves grossly intact. moves all 4 extremities w/o difficulty. Affect pleasant   Telemetry   NSR 60s (personally reviewed)  EKG    NSR 51 (low voltage P's), personally reviewed  Labs    CBC Recent Labs    09/04/22 1252 09/05/22 0054  WBC 12.3* 8.7  HGB 13.9 13.3  HCT 41.7 39.0  MCV 87.2 87.2  PLT 302 246   Basic Metabolic Panel Recent Labs    85/88/50 1252 09/05/22 0054  NA 130* 134*  K 3.7 2.8*  CL 92* 96*  CO2 24 24  GLUCOSE 158* 148*  BUN 25* 22  CREATININE 2.51*  2.13*  CALCIUM 9.0 8.5*   Liver Function Tests Recent Labs    09/04/22 1252  AST 23  ALT 23  ALKPHOS 76  BILITOT 1.1  PROT 6.2*  ALBUMIN 3.5   No results for input(s): "LIPASE", "AMYLASE" in the last 72 hours. Cardiac Enzymes No results for input(s): "CKTOTAL", "CKMB", "CKMBINDEX", "TROPONINI" in the last 72 hours.  BNP: BNP (last 3 results) Recent Labs    06/23/22 1752 06/24/22 0358 09/04/22 1252  BNP 3,165.5* 3,698.8* 226.5*    ProBNP (last 3 results) No results for input(s): "PROBNP" in the last 8760 hours.   D-Dimer No results for input(s): "DDIMER" in the last 72 hours. Hemoglobin A1C No results for input(s): "HGBA1C" in the last 72 hours. Fasting Lipid Panel No results for input(s): "CHOL", "HDL", "LDLCALC", "TRIG", "CHOLHDL", "LDLDIRECT" in the last 72 hours. Thyroid Function Tests No results for input(s): "TSH", "T4TOTAL", "T3FREE", "THYROIDAB" in the last 72 hours.  Invalid input(s): "FREET3"  Other results:   Imaging    CT Renal Stone Study  Result Date: 09/04/2022 CLINICAL DATA:  Flank pain EXAM: CT ABDOMEN AND PELVIS WITHOUT CONTRAST TECHNIQUE: Multidetector CT imaging of the abdomen and pelvis was performed following the standard protocol without IV contrast. RADIATION DOSE REDUCTION: This exam was performed according to the departmental dose-optimization program which includes automated exposure control,  adjustment of the mA and/or kV according to patient size and/or use of iterative reconstruction technique. COMPARISON:  CT abdomen and pelvis dated May 28, 2011 FINDINGS: Lower chest: Coronary artery calcifications. Small to moderate hiatal hernia. No acute abnormality. Hepatobiliary: No focal liver abnormality is seen. Intraluminal hyperdensity seen within the gallbladder, likely due to sludge. No gallstones, gallbladder wall thickening, or biliary dilatation. Pancreas: Unremarkable. No pancreatic ductal dilatation or surrounding inflammatory  changes. Spleen: Normal in size without focal abnormality. Adrenals/Urinary Tract: Low-attenuation nodule of the left adrenal gland measuring 1.4 cm on series 3, image 24, unchanged when compared with prior and compatible with benign adenoma, no further follow-up imaging is recommended. Right adrenal gland is unremarkable. No hydronephrosis. Atrophic left kidney, new when compared with the prior. Renal vascular calcifications with no definite nephrolithiasis. Bilateral renal lesions of varying complexity, most are compatible with simple or proteinaceous cysts, although there is an indeterminate lesion of the posterior right kidney measuring 1.4 cm on series 3, image 28, not definitely present on prior exam. Bladder is unremarkable. Stomach/Bowel: Stomach is within normal limits. Prior partial colectomy. No evidence of bowel wall thickening, distention, or inflammatory changes. Vascular/Lymphatic: Severe aortic atherosclerosis. Bilateral iliac artery stents. No enlarged abdominal or pelvic lymph nodes. Reproductive: Unchanged mild prostatomegaly Other: Complex ventral abdominal wall hernia which contains fat and a nondilated loop of small bowel a component located right of midline. No abdominopelvic ascites. Musculoskeletal: No acute or significant osseous findings. IMPRESSION: 1. No acute findings in the abdomen or pelvis, including no evidence of obstructive uropathy. 2. Complex ventral abdominal wall hernia which contains a nondilated loop of small bowel. 3. Indeterminate lesion of the posterior right kidney measuring 1.4 cm. Recommend further evaluation with renal protocol MRI, this can be performed non emergently. 4. Aortic Atherosclerosis (ICD10-I70.0). Electronically Signed   By: Allegra Lai M.D.   On: 09/04/2022 17:59   CT Head Wo Contrast  Result Date: 09/04/2022 CLINICAL DATA:  Head trauma EXAM: CT HEAD WITHOUT CONTRAST TECHNIQUE: Contiguous axial images were obtained from the base of the skull  through the vertex without intravenous contrast. RADIATION DOSE REDUCTION: This exam was performed according to the departmental dose-optimization program which includes automated exposure control, adjustment of the mA and/or kV according to patient size and/or use of iterative reconstruction technique. COMPARISON:  CT head 06/08/2021 FINDINGS: Brain: No evidence of acute infarction, hemorrhage, hydrocephalus, extra-axial collection or mass lesion/mass effect. Again seen is mild diffuse atrophy. There is mild periventricular white matter hypodensity, likely chronic small vessel ischemic change. There is a punctate cortical calcification in the right temporal region which likely represents dystrophic calcification, new from prior. Vascular: Atherosclerotic calcifications are present within the cavernous internal carotid arteries. Skull: Normal. Negative for fracture or focal lesion. Sinuses/Orbits: No acute finding. Other: None. IMPRESSION: 1. No acute intracranial process. 2. Mild atrophy and chronic small vessel ischemic change. Electronically Signed   By: Darliss Cheney M.D.   On: 09/04/2022 17:50   DG Chest 2 View  Result Date: 09/04/2022 CLINICAL DATA:  Chest pain and weakness EXAM: CHEST - 2 VIEW COMPARISON:  Chest 06/23/2022 FINDINGS: Heart size within normal limits. Negative for heart failure. Lungs clear without infiltrate or effusion Spinal cord stimulator midthoracic spine unchanged Foreign body in the left lower lobe likely in the pulmonary artery. This was not present previously. Foreign body measures approximately 3 x 12 mm. IMPRESSION: 1. No active cardiopulmonary disease. 2. Foreign body in the left lower lobe likely in the pulmonary artery.  Electronically Signed   By: Marlan Palau M.D.   On: 09/04/2022 13:11     Medications:     Scheduled Medications:  amiodarone  200 mg Oral Daily   amLODipine  10 mg Oral Daily   apixaban  2.5 mg Oral BID   atorvastatin  40 mg Oral Daily    carvedilol  25 mg Oral BID WC   hydrALAZINE  100 mg Oral Q8H   insulin aspart  0-9 Units Subcutaneous TID WC   isosorbide mononitrate  120 mg Oral Daily   potassium chloride  40 mEq Oral Once   potassium chloride  40 mEq Oral Once   sodium chloride flush  3 mL Intravenous Q12H    Infusions:   PRN Medications: acetaminophen **OR** acetaminophen, hydrALAZINE, ondansetron **OR** ondansetron (ZOFRAN) IV, senna-docusate    Assessment/Plan   1. ?Syncope/presyncope: Patient does not remember events prior to admission well.  He fell to the floor and is not sure if he passed out or not.  I reviewed initial ECG, it was NSR in 50s with low voltage P's (not junctional rhythm).  He was thought to be dehydrated at admission, IVC small and collapsed on POCUS with AKI/creatinine 2.51 and elevated lactate.  Lactate quickly cleared, was 1.6 last night.  Event may have been due to dehydration/hypovolemia with poor po intake and diuretic usage.  Creatinine now back to baseline 2.13 with IVF overnight.  - Looks euvolemic and eating, does not need further IVF.  - Continue to hold spironolactone and torsemide for today.  - Follow on telemetry (NSR in 60s so far), would arrange 14 day Zio monitor at discharge to assess for significant bradyarrhythmias.   - Needs PT/OT evaluation.  He appears frail, may need rehab stay.   2. Chronic systolic CHF: Suspect primarily nonischemic CMP, ?due to long-standing HTN vs tachy-mediated with prior atrial fibrillation.  Has h/o CAD but this does not seem to explain CMP.  Echo in 8/23 showed EF 25-30% with normal RV, trivial MR, dilated IVC.  This was significantly lower than in the past (EF 50-55% in 3/23).  LHC in 9/23 showed nonobstructive CAD, unable to explain fall in EF. He was in atrial fibrillation in 8/23, ?tachy-mediated CMP.  Patient was admitted in 10/23 with CHF and Cardiomems was placed but he did not followup after discharge. This admission, he appeared hypovolemic.   Now s/p IV fluid overnight and euvolemic today with creatinine 2.13 (baseline 1.8-2).  BP elevated.  - Hold torsemide and spironolactone today.  May be able to restart spironolactone 12.5 mg daily tomorrow.  Would keep off torsemide for now.  - Continue Coreg 25 mg bid but follow on telemetry and then outpatient Zio monitor for bradyarrhythmias.  - Off entresto w/history of AKI and bilateral renal artery stenosis. - He was on Farxiga at last admission but not at home.  Will not start yet with recent hypovolemia.  - Continue po hydralazine 100 TID + Imdur 120 daily.  - Recommended more regular Cardiomems use (has not used since 12/7).  - Needs echocardiogram this admission.  3. Atrial fibrillation: Paroxysmal.  In NSR this admission.  Concern for possible component of tachy-mediated cardiomyopathy.  - Can decrease amiodarone to 200 mg once daily.  - Continue apixaban 2.5 mg bid.  4. HTN/renal artery stenosis: Patient was admitted with hypertensive emergency/CHF.  Patient has occluded left renal artery with atrophic left kidney.  By renal artery dopplers, right renal artery with >60% stenosis.  Reviewed in the  past with Dr Allyson Sabal, and he thinks that the degree of stenosis is actually less, more in the 40-50% range.  Will need to follow closely as it is unclear how much renal artery stenosis is contributing to HTN/flash pulmonary edema episodes.   - Was supposed to see Dr. Allyson Sabal as outpatient for consideration for CO2 angiography.    5. CAD: H/o PCI x 2 to RCA.  Cath in 8/23 after EF found to be low on echo did not explain his cardiomyopathy (nonobstructive CAD).  Today, he tells me that he has not had any recent chest pain.  HS-TnI mildly elevated with no trend at admission, suspect demand ischemia with hypovolemia and possible syncope.   - No indication for coronary angiography.  - Continue statin.  - On Eliquis so no ASA or Plavix.  6. PAD: He has severe right SFA and right EIA stenosis by 12/21  peripheral angiography.  Follows with Dr. Allyson Sabal. No rest pain or pedal ulcerations.  He has significant leg weakness with ambulation, this may be due to PAD.  7. AKI on CKD stage 3: Creatinine up to 2.5 at admission, suspect hypovolemia.  Baseline CKD may be due to significant renal artery stenosis.  Baseline creatinine appears to be 1.8-2, creatinine down to 2.1 today.  - Continue to hold torsemide, not sure he will need at home. Could consider Marcelline Deist instead depending on clinical picture over the next couple days.   Length of Stay: 0  Marca Ancona, MD  09/05/2022, 8:57 AM  Advanced Heart Failure Team Pager [phone] (M-F; 7a - 5p)  Please contact [facility] for night-coverage after hours (5p -7a ) and weekends on [email or web address]

## 2022-09-05 NOTE — ED Notes (Signed)
Pt is eating breakfast at the moment.

## 2022-09-05 NOTE — Progress Notes (Signed)
Progress Note   Patient: Albert Powell EXN:[id number] DOB: 07-25-1941 DOA: 09/04/2022     0 DOS: the patient was seen and examined on 09/05/2022   Brief hospital course: Albert Powell was admitted to the hospital with the working diagnosis of AKI on CKD    81 y.o. male with medical history significant for chronic combined systolic and diastolic CHF (EF 49-67%) (has cardiomems implanted on 06/2022), CAD s/p PCI, PAF on Eliquis, carotid artery disease, PAD, CKD stage IIIb, T2DM, HTN, HLD, hypothyroidism, and dementia. Apparently patient had a syncope episode at home, EMS was called and patient was transported to the ED. Patient did not recall further details. On his initial physical examination his blood pressure was 149/62, HR 50, RR 18 and 02 saturation 100% on room air. Moist mucous membranes, heart with S1 and S2 present irregularly irregular with no gallops, respiratory with no rales or wheezing, abdomen with no distention, no lower extremity edema.   Na 130, K 3,7 Cl 92 bicarbonate 24 glucose 158 bun 25 cr 2,51  BNP 226  High sensitive troponin 26 and 21  Lactic acid 4,1 and 1.6  Wbc 12.3 hgb 13,9 plt 302   Chest radiograph with mild cardiomegaly with no infiltrates or effusions.   EKG 51 bpm, left axis deviation, normal intervals, sinus rhythm, with no significant ST segment or T wave changes.   Patient was placed on gently IV fluids for volume resuscitation.    Assessment and Plan: * Acute kidney injury superimposed on chronic kidney disease ([name]) Hyponatremia, hypokalemia.  CKD stage 3b.   Follow up renal function with serum cr at 2.13, K is 2,8 and serum bicarbonate at 24.  Na 131 and Cl 96.   Plan to continue Kcl 40 meq x2 to correct hypokalemia. Continue to hold on diuretic therapy. Follow up renal function in am.   Near syncope Possible syncope at home. Patient with sinus bradycardia and hypovolemic on admission.  He has tolerated well IV fluids.   Plan to  continue telemetry monitoring.  His heart rate has drooped to the 40's, will decreased carvedilol to 12.5 mg bid with holding parameters.  Follow up with Pt and Ot.   Type 2 diabetes mellitus with stage 3b chronic kidney disease ([name]) Continue glucose cover and monitoring with insulin sliding scale.   Chronic combined systolic and diastolic CHF (congestive heart failure) ([name]) Last EF 25-30% on 05/14/2022.  RHC 07/02/2022 showed mild pulmonary venous hypertension, CardioMEMS placed at that time.  Appears overall volume depleted.  Seen by advanced heart failure team.  Continue to hold on diuretic therapy. Telemetry monitoring  Paroxysmal atrial fibrillation ([name]) Continue rate control with amiodarone and carvedilol (reduced dose of carvedilol due to bradycardia).  Telemetry personally reviewed with sinus bradycardia  45 bpm, 1st degree AV block.   Coronary artery disease involving native coronary artery of native heart without angina pectoris Reported atypical chest pressure on arrival, now resolved.  Only minimal troponin elevation.  Evaluated by advanced heart failure team, felt unlikely to be ACS. -Continue Coreg, atorvastatin, Imdur, Eliquis -Of note, Plavix listed on home meds however it was recommended he did not need to continue this per Dr. Alford Highland previous note since he is on Eliquis  Acquired hypothyroidism Continue with levothyroxine.   Hyperlipidemia associated with type 2 diabetes mellitus ([name]) Continue atorvastatin.  Hypertension associated with diabetes ([name]) BP stable.  Continue hydralazine, Coreg, Imdur.  Holding torsemide and spironolactone.  Lesion of right native kidney Indeterminate lesion of  the posterior right kidney measuring 1.4 cm noted on CT renal stone study.  Nonemergent renal protocol MRI recommended for further evaluation.        Subjective: Patient with improvement in his symptoms, still not sure if he had a syncope episode at home, he has not  dyspnea or chest pain   Physical Exam: Vitals:   09/05/22 1100 09/05/22 1400 09/05/22 1430 09/05/22 1535  BP: (!) 158/61 (!) 147/63  (!) 141/55  Pulse: 62 (!) 52 (!) 47 (!) 47  Resp: 15 16 15 18   Temp:  99 F (37.2 C)  97.6 F (36.4 C)  TempSrc:  Oral  Oral  SpO2: 96% 97% 97% 98%   Neurology awake and alert ENT with mild pallor Cardiovascular with S1 and S2 present and rhythmic with no gallops or rubs No JVD No lower extremity edema Respiratory with no rales or wheezing Abdomen with no distention  Data Reviewed:    Family Communication: no family at the bedside   Disposition: Status is: Observation The patient remains OBS appropriate and will d/c before 2 midnights.  Planned Discharge Destination: Home    Author: , MD 09/05/2022 5:32 PM  For on call review [email or web address].

## 2022-09-06 DIAGNOSIS — F0390 Unspecified dementia without behavioral disturbance: Secondary | ICD-10-CM | POA: Diagnosis present

## 2022-09-06 DIAGNOSIS — E872 Acidosis, unspecified: Secondary | ICD-10-CM | POA: Diagnosis present

## 2022-09-06 DIAGNOSIS — I152 Hypertension secondary to endocrine disorders: Secondary | ICD-10-CM | POA: Diagnosis present

## 2022-09-06 DIAGNOSIS — E785 Hyperlipidemia, unspecified: Secondary | ICD-10-CM | POA: Diagnosis present

## 2022-09-06 DIAGNOSIS — E876 Hypokalemia: Secondary | ICD-10-CM | POA: Diagnosis not present

## 2022-09-06 DIAGNOSIS — N189 Chronic kidney disease, unspecified: Secondary | ICD-10-CM | POA: Diagnosis not present

## 2022-09-06 DIAGNOSIS — E1122 Type 2 diabetes mellitus with diabetic chronic kidney disease: Secondary | ICD-10-CM | POA: Diagnosis present

## 2022-09-06 DIAGNOSIS — I5042 Chronic combined systolic (congestive) and diastolic (congestive) heart failure: Secondary | ICD-10-CM | POA: Diagnosis present

## 2022-09-06 DIAGNOSIS — R55 Syncope and collapse: Secondary | ICD-10-CM | POA: Diagnosis present

## 2022-09-06 DIAGNOSIS — I10 Essential (primary) hypertension: Secondary | ICD-10-CM | POA: Diagnosis not present

## 2022-09-06 DIAGNOSIS — E871 Hypo-osmolality and hyponatremia: Secondary | ICD-10-CM | POA: Diagnosis present

## 2022-09-06 DIAGNOSIS — N179 Acute kidney failure, unspecified: Secondary | ICD-10-CM | POA: Diagnosis present

## 2022-09-06 DIAGNOSIS — N289 Disorder of kidney and ureter, unspecified: Secondary | ICD-10-CM | POA: Diagnosis not present

## 2022-09-06 DIAGNOSIS — J449 Chronic obstructive pulmonary disease, unspecified: Secondary | ICD-10-CM | POA: Diagnosis present

## 2022-09-06 DIAGNOSIS — E669 Obesity, unspecified: Secondary | ICD-10-CM | POA: Diagnosis present

## 2022-09-06 DIAGNOSIS — E861 Hypovolemia: Secondary | ICD-10-CM | POA: Diagnosis present

## 2022-09-06 DIAGNOSIS — E1151 Type 2 diabetes mellitus with diabetic peripheral angiopathy without gangrene: Secondary | ICD-10-CM | POA: Diagnosis present

## 2022-09-06 DIAGNOSIS — I130 Hypertensive heart and chronic kidney disease with heart failure and stage 1 through stage 4 chronic kidney disease, or unspecified chronic kidney disease: Secondary | ICD-10-CM | POA: Diagnosis present

## 2022-09-06 DIAGNOSIS — I2510 Atherosclerotic heart disease of native coronary artery without angina pectoris: Secondary | ICD-10-CM | POA: Diagnosis present

## 2022-09-06 DIAGNOSIS — Z79899 Other long term (current) drug therapy: Secondary | ICD-10-CM | POA: Diagnosis not present

## 2022-09-06 DIAGNOSIS — E1169 Type 2 diabetes mellitus with other specified complication: Secondary | ICD-10-CM | POA: Diagnosis present

## 2022-09-06 DIAGNOSIS — Z87891 Personal history of nicotine dependence: Secondary | ICD-10-CM | POA: Diagnosis not present

## 2022-09-06 DIAGNOSIS — I480 Paroxysmal atrial fibrillation: Secondary | ICD-10-CM | POA: Diagnosis present

## 2022-09-06 DIAGNOSIS — N1832 Chronic kidney disease, stage 3b: Secondary | ICD-10-CM | POA: Diagnosis present

## 2022-09-06 DIAGNOSIS — E860 Dehydration: Secondary | ICD-10-CM | POA: Diagnosis present

## 2022-09-06 DIAGNOSIS — E039 Hypothyroidism, unspecified: Secondary | ICD-10-CM | POA: Diagnosis present

## 2022-09-06 DIAGNOSIS — R0789 Other chest pain: Secondary | ICD-10-CM

## 2022-09-06 DIAGNOSIS — I701 Atherosclerosis of renal artery: Secondary | ICD-10-CM | POA: Diagnosis present

## 2022-09-06 LAB — GLUCOSE, CAPILLARY
Glucose-Capillary: 136 mg/dL — ABNORMAL HIGH (ref 70–99)
Glucose-Capillary: 165 mg/dL — ABNORMAL HIGH (ref 70–99)
Glucose-Capillary: 134 mg/dL — ABNORMAL HIGH (ref 70–99)
Glucose-Capillary: 141 mg/dL — ABNORMAL HIGH (ref 70–99)
Glucose-Capillary: 131 mg/dL — ABNORMAL HIGH (ref 70–99)

## 2022-09-06 LAB — BASIC METABOLIC PANEL
Anion gap: 8 (ref 5–15)
BUN: 24 mg/dL — ABNORMAL HIGH (ref 8–23)
CO2: 26 mmol/L (ref 22–32)
Calcium: 8.9 mg/dL (ref 8.9–10.3)
Chloride: 98 mmol/L (ref 98–111)
Creatinine, Ser: 1.92 mg/dL — ABNORMAL HIGH (ref 0.61–1.24)
GFR, Estimated: 35 mL/min — ABNORMAL LOW (ref >60)
Glucose, Bld: 131 mg/dL — ABNORMAL HIGH (ref 70–99)
Potassium: 4.2 mmol/L (ref 3.5–5.1)
Sodium: 132 mmol/L — ABNORMAL LOW (ref 135–145)

## 2022-09-06 MED ORDER — LEVOTHYROXINE SODIUM 75 MCG PO TABS
75.0000 ug | ORAL_TABLET | Freq: Every day | ORAL | Status: DC
  Administered 2022-09-07: 75 ug via ORAL
  Filled 2022-09-06: qty 1

## 2022-09-06 MED ORDER — CARVEDILOL 6.25 MG PO TABS
6.2500 mg | ORAL_TABLET | Freq: Two times a day (BID) | ORAL | Status: DC
  Filled 2022-09-06 (×2): qty 1

## 2022-09-06 MED ORDER — PROCHLORPERAZINE EDISYLATE 10 MG/2ML IJ SOLN
5.0000 mg | Freq: Once | INTRAMUSCULAR | Status: AC | PRN
  Administered 2022-09-06: 5 mg via INTRAVENOUS
  Filled 2022-09-06: qty 1

## 2022-09-06 NOTE — Progress Notes (Signed)
Rounding Note    Patient Name: Albert Powell Date of Encounter: 09/06/2022  [place] Cardiologist: Quay Burow, MD   Subjective   No complaints  Inpatient Medications    Scheduled Meds:  amiodarone  200 mg Oral Daily   amLODipine  10 mg Oral Daily   apixaban  2.5 mg Oral BID   atorvastatin  40 mg Oral Daily   carvedilol  12.5 mg Oral BID WC   hydrALAZINE  100 mg Oral Q8H   insulin aspart  0-9 Units Subcutaneous TID WC   isosorbide mononitrate  120 mg Oral Daily   sodium chloride flush  3 mL Intravenous Q12H   Continuous Infusions:  PRN Meds: acetaminophen **OR** acetaminophen, hydrALAZINE, ondansetron **OR** ondansetron (ZOFRAN) IV, senna-docusate   Vital Signs    Vitals:   09/05/22 1954 09/06/22 0039 09/06/22 0336 09/06/22 0823  BP: (!) 141/47 (!) 151/55 (!) 148/62 (!) 135/55  Pulse: (!) 54 (!) 56 (!) 56 (!) 50  Resp: 18 17 19 16   Temp: 97.6 F (36.4 C) 98.3 F (36.8 C) 98.3 F (36.8 C) 98.9 F (37.2 C)  TempSrc: Oral Oral Oral Oral  SpO2: 91% 96% 97% 97%  Weight:  91.3 kg      Intake/Output Summary (Last 24 hours) at 09/06/2022 0923 Last data filed at 09/06/2022 [id number] Gross per 24 hour  Intake --  Output 750 ml  Net -750 ml      09/06/2022   12:39 AM 09/04/2022    9:38 AM 08/28/2022    5:40 PM  Last 3 Weights  Weight (lbs) 201 lb 4.5 oz 196 lb 12.8 oz 220 lb  Weight (kg) 91.3 kg 89.268 kg 99.791 kg      Telemetry    Brady 30s- 40s at times - Personally Reviewed  ECG    N/a - Personally Reviewed  Physical Exam   GEN: No acute distress.   Neck: No JVD Cardiac: irreg  Respiratory: Clear to auscultation bilaterally. GI: Soft, nontender, non-distended  MS: No edema; No deformity. Neuro:  Nonfocal  Psych: Normal affect   Labs    High Sensitivity Troponin:   Recent Labs  Lab 08/28/22 1915 08/28/22 2045 09/04/22 1252 09/04/22 1406  TROPONINIHS 24* 20* 26* 21*     Chemistry Recent Labs  Lab 09/04/22 1252  09/05/22 0054 09/06/22 0043  NA 130* 134* 132*  K 3.7 2.8* 4.2  CL 92* 96* 98  CO2 24 24 26   GLUCOSE 158* 148* 131*  BUN 25* 22 24*  CREATININE 2.51* 2.13* 1.92*  CALCIUM 9.0 8.5* 8.9  PROT 6.2*  --   --   ALBUMIN 3.5  --   --   AST 23  --   --   ALT 23  --   --   ALKPHOS 76  --   --   BILITOT 1.1  --   --   GFRNONAA 25* 31* 35*  ANIONGAP 14 14 8     Lipids No results for input(s): "CHOL", "TRIG", "HDL", "LABVLDL", "LDLCALC", "CHOLHDL" in the last 168 hours.  Hematology Recent Labs  Lab 09/04/22 1252 09/05/22 0054  WBC 12.3* 8.7  RBC 4.78 4.47  HGB 13.9 13.3  HCT 41.7 39.0  MCV 87.2 87.2  MCH 29.1 29.8  MCHC 33.3 34.1  RDW 14.2 14.0  PLT 302 246   Thyroid No results for input(s): "TSH", "FREET4" in the last 168 hours.  BNP Recent Labs  Lab 09/04/22 1252  BNP 226.5*  DDimer No results for input(s): "DDIMER" in the last 168 hours.   Radiology    ECHOCARDIOGRAM COMPLETE  Result Date: 09/05/2022    ECHOCARDIOGRAM REPORT   Patient Name:   Albert Powell Date of Exam: 09/05/2022 Medical Rec #:  [id number]        Height:       69.0 in Accession #:    [id number]       Weight:       196.8 lb Date of Birth:  26-Jun-1941        BSA:          2.052 m Patient Age:    81 years         BP:           158/61 mmHg Patient Gender: M                HR:           47 bpm. Exam Location:  Inpatient Procedure: 2D Echo Indications:    acute systolic chf  History:        Patient has prior history of Echocardiogram examinations, most                 recent 05/14/2022. CHF, CAD, PAD, Arrythmias:Atrial Fibrillation;                 Risk Factors:Former Smoker, Diabetes, Hypertension and                 Dyslipidemia.  Sonographer:    Johny Chess RDCS Referring Phys: [place] Comments: Image acquisition challenging due to patient body habitus. IMPRESSIONS  1. LV wall assessment is challenging with windows; grossly no WMA. Left ventricular ejection fraction, by  estimation, is 55 to 60%. The left ventricle has normal function. Left ventricular diastolic parameters are indeterminate.  2. Right ventricular systolic function is normal. The right ventricular size is normal. Tricuspid regurgitation signal is inadequate for assessing PA pressure.  3. No evidence of mitral valve regurgitation.  4. Aortic valve regurgitation is not visualized. Aortic valve sclerosis/calcification is present, without any evidence of aortic stenosis.  5. The inferior vena cava is normal in size with greater than 50% respiratory variability, suggesting right atrial pressure of 3 mmHg. Conclusion(s)/Recommendation(s): Compared to prior study 05/14/2022, LVEF has improved. FINDINGS  Left Ventricle: LV wall assessment is challenging with windows; grossly no WMA. Left ventricular ejection fraction, by estimation, is 55 to 60%. The left ventricle has normal function. The left ventricular internal cavity size was normal in size. There is no left ventricular hypertrophy. Left ventricular diastolic parameters are indeterminate. Right Ventricle: The right ventricular size is normal. Right ventricular systolic function is normal. Tricuspid regurgitation signal is inadequate for assessing PA pressure. Left Atrium: Left atrial size was normal in size. Right Atrium: Right atrial size was normal in size. Pericardium: There is no evidence of pericardial effusion. Mitral Valve: Mild to moderate mitral annular calcification. No evidence of mitral valve regurgitation. Tricuspid Valve: Tricuspid valve regurgitation is not demonstrated. Aortic Valve: Aortic valve regurgitation is not visualized. Aortic valve sclerosis/calcification is present, without any evidence of aortic stenosis. Pulmonic Valve: Pulmonic valve regurgitation is not visualized. Aorta: The aortic root and ascending aorta are structurally normal, with no evidence of dilitation. Venous: The inferior vena cava is normal in size with greater than 50%  respiratory variability, suggesting right atrial pressure of 3 mmHg. IAS/Shunts: No atrial level shunt detected by color flow Doppler.  Diastology LV e' medial:    3.92 cm/s LV E/e' medial:  20.7 LV e' lateral:   5.00 cm/s LV E/e' lateral: 16.2  RIGHT VENTRICLE             IVC RV Basal diam:  2.70 cm     IVC diam: 1.50 cm RV S prime:     10.00 cm/s TAPSE (M-mode): 1.9 cm LEFT ATRIUM             Index        RIGHT ATRIUM           Index LA Vol (A2C):   49.7 ml 24.22 ml/m  RA Area:     13.60 cm LA Vol (A4C):   47.4 ml 23.10 ml/m  RA Volume:   29.40 ml  14.33 ml/m LA Biplane Vol: 51.7 ml 25.20 ml/m  AORTIC VALVE LVOT Vmax:   87.70 cm/s LVOT Vmean:  52.200 cm/s LVOT VTI:    0.213 m  AORTA Ao Asc diam: 3.10 cm MITRAL VALVE MV Area (PHT): 2.77 cm    SHUNTS MV Decel Time: 274 msec    Systemic VTI: 0.21 m MV E velocity: 81.10 cm/s MV A velocity: 86.20 cm/s MV E/A ratio:  0.94 Photographer signed by Carolan Clines Signature Date/Time: 09/05/2022/2:55:14 PM    Final    CT Renal Stone Study  Result Date: 09/04/2022 CLINICAL DATA:  Flank pain EXAM: CT ABDOMEN AND PELVIS WITHOUT CONTRAST TECHNIQUE: Multidetector CT imaging of the abdomen and pelvis was performed following the standard protocol without IV contrast. RADIATION DOSE REDUCTION: This exam was performed according to the departmental dose-optimization program which includes automated exposure control, adjustment of the mA and/or kV according to patient size and/or use of iterative reconstruction technique. COMPARISON:  CT abdomen and pelvis dated May 28, 2011 FINDINGS: Lower chest: Coronary artery calcifications. Small to moderate hiatal hernia. No acute abnormality. Hepatobiliary: No focal liver abnormality is seen. Intraluminal hyperdensity seen within the gallbladder, likely due to sludge. No gallstones, gallbladder wall thickening, or biliary dilatation. Pancreas: Unremarkable. No pancreatic ductal dilatation or surrounding inflammatory  changes. Spleen: Normal in size without focal abnormality. Adrenals/Urinary Tract: Low-attenuation nodule of the left adrenal gland measuring 1.4 cm on series 3, image 24, unchanged when compared with prior and compatible with benign adenoma, no further follow-up imaging is recommended. Right adrenal gland is unremarkable. No hydronephrosis. Atrophic left kidney, new when compared with the prior. Renal vascular calcifications with no definite nephrolithiasis. Bilateral renal lesions of varying complexity, most are compatible with simple or proteinaceous cysts, although there is an indeterminate lesion of the posterior right kidney measuring 1.4 cm on series 3, image 28, not definitely present on prior exam. Bladder is unremarkable. Stomach/Bowel: Stomach is within normal limits. Prior partial colectomy. No evidence of bowel wall thickening, distention, or inflammatory changes. Vascular/Lymphatic: Severe aortic atherosclerosis. Bilateral iliac artery stents. No enlarged abdominal or pelvic lymph nodes. Reproductive: Unchanged mild prostatomegaly Other: Complex ventral abdominal wall hernia which contains fat and a nondilated loop of small bowel a component located right of midline. No abdominopelvic ascites. Musculoskeletal: No acute or significant osseous findings. IMPRESSION: 1. No acute findings in the abdomen or pelvis, including no evidence of obstructive uropathy. 2. Complex ventral abdominal wall hernia which contains a nondilated loop of small bowel. 3. Indeterminate lesion of the posterior right kidney measuring 1.4 cm. Recommend further evaluation with renal protocol MRI, this can be performed non emergently. 4. Aortic Atherosclerosis (ICD10-I70.0). Electronically Signed  By: Yetta Glassman M.D.   On: 09/04/2022 17:59   CT Head Wo Contrast  Result Date: 09/04/2022 CLINICAL DATA:  Head trauma EXAM: CT HEAD WITHOUT CONTRAST TECHNIQUE: Contiguous axial images were obtained from the base of the skull  through the vertex without intravenous contrast. RADIATION DOSE REDUCTION: This exam was performed according to the departmental dose-optimization program which includes automated exposure control, adjustment of the mA and/or kV according to patient size and/or use of iterative reconstruction technique. COMPARISON:  CT head 06/08/2021 FINDINGS: Brain: No evidence of acute infarction, hemorrhage, hydrocephalus, extra-axial collection or mass lesion/mass effect. Again seen is mild diffuse atrophy. There is mild periventricular white matter hypodensity, likely chronic small vessel ischemic change. There is a punctate cortical calcification in the right temporal region which likely represents dystrophic calcification, new from prior. Vascular: Atherosclerotic calcifications are present within the cavernous internal carotid arteries. Skull: Normal. Negative for fracture or focal lesion. Sinuses/Orbits: No acute finding. Other: None. IMPRESSION: 1. No acute intracranial process. 2. Mild atrophy and chronic small vessel ischemic change. Electronically Signed   By: Ronney Asters M.D.   On: 09/04/2022 17:50   DG Chest 2 View  Result Date: 09/04/2022 CLINICAL DATA:  Chest pain and weakness EXAM: CHEST - 2 VIEW COMPARISON:  Chest 06/23/2022 FINDINGS: Heart size within normal limits. Negative for heart failure. Lungs clear without infiltrate or effusion Spinal cord stimulator midthoracic spine unchanged Foreign body in the left lower lobe likely in the pulmonary artery. This was not present previously. Foreign body measures approximately 3 x 12 mm. IMPRESSION: 1. No active cardiopulmonary disease. 2. Foreign body in the left lower lobe likely in the pulmonary artery. Electronically Signed   By: Franchot Gallo M.D.   On: 09/04/2022 13:11    Cardiac Studies     Patient Profile     81 y/o male white male w/ h/o hypertension, chronic combined systolic and diastolic heart failure, CAD s/p stenting x2 to RCA in 2003 with  residual LAD disease, carotid artery disease s/p LICA endarterectomy [id number], LE peripheral arterial disease, hyperlipidemia, T2DM, renal artery stenosis,CKD IIIb dementia and poor compliance, presenting w/ atypical CP and found to be dehydrated w/ AKI. HS trop not c/w ACS.   Assessment & Plan    1.Chest pain/CAD - history of CAD with stenting to RCA x 2 in 2003,residual LAD disease at the time - presented with atypical chest pain - mild flat chronically elevated troponin not consistent with ACS. EKG no acute ischemic changes - CKD and dementia would in general affect considerations for ischemic testing.   - no plans for ischemic testing at this time.   2. Chronic combined sysotlic/diastolic HF - - Echo 123XX123: normal LVEF 50-55%, G1DD and normal RV - Echo 8/23 drop in EF, down to 25-30%, RV normal. Suspect tachymediated from Afib  - RHC 10/23, normal filling pressures and output. CardioMEMs placed but pt not compliant w/ daily transmissions   - presented hypovolemic with AKI - diuretics held, gentle IVFs. Cr is improving. Had been on torsemide 40mg  daily at home, would lower to 20mg  daily.Hold diuretics again today, likely start oral tomorrow - in general long term HF meds limited by CKD. Currently on coreg, hydral/imdur  3. Bradycardia - coreg lowered to 12.5mg  bid, AM dose held due to low HRs. Change PM dose to 6.25mg  with hold parameters  4. PAF - on amio, coreg, eliquis 2.5mg  bid  5. AKI on CKD - improving with holding diuretics, IVFs  For questions or updates, please contact [facility] Please consult [email or web address] for contact info under        Signed, Dina Rich, MD  09/06/2022, 9:23 AM

## 2022-09-06 NOTE — Evaluation (Signed)
Occupational Therapy Evaluation Patient Details Name: Albert Powell MRN: [id number] DOB: 1941-05-04 Today's Date: 09/06/2022   History of Present Illness Pt is an 81 y.o. male admitted 09/04/22 after possible syncopal episode at home. Workup for AKI on CKD. PMH includes CAD, chronic diastolic CHF, HTN, HLD, A-fib, COPD, DM2, dementia.   Clinical Impression   Pt admitted for concerns listed above. PTA pt reported that he was relatively independent with all ADL's and IADL's, however his wife would assist him when needed. He uses a rollator at baseline, however has had multiple falls, which he states are due to his legs giving out on him. This session pt completed a stand step pivot transfer, along with toileting and hygiene, with a gown change. He required mod assist for toileting and min guard for all other tasks. Pt has poor balance and activity tolerance, he will benefit from [facility] [place] to maximize independence and safety at home. OT will follow acutely.       Recommendations for follow up therapy are one component of a multi-disciplinary discharge planning process, led by the attending physician.  Recommendations may be updated based on patient status, additional functional criteria and insurance authorization.   Follow Up Recommendations  Home health OT     Assistance Recommended at Discharge Intermittent Supervision/Assistance  Patient can return home with the following A little help with walking and/or transfers;A lot of help with bathing/dressing/bathroom;Assistance with cooking/housework    Functional Status Assessment  Patient has had a recent decline in their functional status and demonstrates the ability to make significant improvements in function in a reasonable and predictable amount of time.  Equipment Recommendations  None recommended by OT    Recommendations for Other Services       Precautions / Restrictions Precautions Precautions: Fall Restrictions Weight Bearing  Restrictions: No      Mobility Bed Mobility Overal bed mobility: Independent             General bed mobility comments: no concerns    Transfers Overall transfer level: Needs assistance Equipment used: Rolling walker (2 wheels) Transfers: Sit to/from Stand, Bed to chair/wheelchair/BSC Sit to Stand: Min assist Stand pivot transfers: Min guard         General transfer comment: Overall min A from lower surface with no arms to push up from, min guard from [facility] and transfer was min guard with RW      Balance Overall balance assessment: Needs assistance Sitting-balance support: No upper extremity supported, Feet supported Sitting balance-Leahy Scale: Good     Standing balance support: Bilateral upper extremity supported, Reliant on assistive device for balance Standing balance-Leahy Scale: Poor Standing balance comment: heavily reliant on RW                           ADL either performed or assessed with clinical judgement   ADL Overall ADL's : Needs assistance/impaired Eating/Feeding: Independent;Sitting   Grooming: Min guard;Minimal assistance;Standing   Upper Body Bathing: Min guard;Sitting   Lower Body Bathing: Minimal assistance;Sitting/lateral leans;Sit to/from stand   Upper Body Dressing : Set up;Sitting   Lower Body Dressing: Min guard;Minimal assistance;Sitting/lateral leans;Sit to/from stand   Toilet Transfer: Minimal assistance;Stand-pivot   Toileting- Clothing Manipulation and Hygiene: Moderate assistance;Sitting/lateral lean;Sit to/from stand       Functional mobility during ADLs: Minimal assistance;Min guard;Rolling walker (2 wheels) General ADL Comments: Limited due to weakness and balance deficits     Vision Baseline Vision/History: 0 No visual  deficits Ability to See in Adequate Light: 0 Adequate Patient Visual Report: No change from baseline Vision Assessment?: No apparent visual deficits     Perception Perception Perception  Tested?: No   Praxis Praxis Praxis tested?: Not tested    Pertinent Vitals/Pain Pain Assessment Pain Assessment: No/denies pain     Hand Dominance Right   Extremity/Trunk Assessment Upper Extremity Assessment Upper Extremity Assessment: Generalized weakness   Lower Extremity Assessment Lower Extremity Assessment: Defer to PT evaluation   Cervical / Trunk Assessment Cervical / Trunk Assessment: Kyphotic   Communication Communication Communication: No difficulties   Cognition Arousal/Alertness: Awake/alert Behavior During Therapy: Flat affect Overall Cognitive Status: No family/caregiver present to determine baseline cognitive functioning                                 General Comments: Overall appears [facility], however unaware of incontinence.     General Comments  VSS on RA    Exercises     Shoulder Instructions      Home Living Family/patient expects to be discharged to:: Private residence Living Arrangements: Spouse/significant other Available Help at Discharge: Family;Available 24 hours/day Type of Home: Apartment Home Access: Level entry     Home Layout: One level     Bathroom Shower/Tub: Tub/shower unit;Curtain   Firefighter: Standard Bathroom Accessibility: Yes How Accessible: Accessible via walker Home Equipment: Rollator (4 wheels);Shower seat;Cane - single point   Additional Comments: reports wife has dementia, but she does the driving and is fairly indep at home      Prior Functioning/Environment Prior Level of Function : Independent/Modified Independent             Mobility Comments: reports mod indep ambulating with rollator; limited distances due to "lower legs give out" without warning, has been happening randomly the past year, typically able to get up from fall, has chairs set around house to keep distance walked short ADLs Comments: reports indep with ADL/iADLs; does not drive; wife does majority of cooking         OT Problem List: Decreased strength;Decreased activity tolerance      OT Treatment/Interventions: Self-care/ADL training;Therapeutic exercise;Energy conservation;DME and/or AE instruction;Therapeutic activities;Patient/family education;Balance training    OT Goals(Current goals can be found in the care plan section) Acute Rehab OT Goals Patient Stated Goal: To go home OT Goal Formulation: With patient Time For Goal Achievement: 09/20/22 Potential to Achieve Goals: Good ADL Goals Pt Will Perform Grooming: with modified independence;standing Pt Will Perform Lower Body Bathing: with supervision;with adaptive equipment;sitting/lateral leans;sit to/from stand Pt Will Perform Lower Body Dressing: with supervision;sitting/lateral leans;sit to/from stand;with adaptive equipment Pt Will Transfer to Toilet: with modified independence;ambulating Pt Will Perform Toileting - Clothing Manipulation and hygiene: with supervision;with adaptive equipment;sitting/lateral leans;sit to/from stand  OT Frequency: Min 2X/week    Co-evaluation              AM-PAC OT "6 Clicks" Daily Activity     Outcome Measure Help from another person eating meals?: None Help from another person taking care of personal grooming?: A Little Help from another person toileting, which includes using toliet, bedpan, or urinal?: A Lot Help from another person bathing (including washing, rinsing, drying)?: A Lot Help from another person to put on and taking off regular upper body clothing?: A Little Help from another person to put on and taking off regular lower body clothing?: A Lot 6 Click Score: 16  End of Session Equipment Utilized During Treatment: Rolling walker (2 wheels) Nurse Communication: Mobility status  Activity Tolerance: Patient tolerated treatment well Patient left: in bed;with call bell/phone within reach;with nursing/sitter in room  OT Visit Diagnosis: Unsteadiness on feet (R26.81);Other abnormalities  of gait and mobility (R26.89);Muscle weakness (generalized) (M62.81)                Time: 1043-1100 OT Time Calculation (min): 17 min Charges:  OT General Charges $OT Visit: 1 Visit OT Evaluation $OT Eval Moderate Complexity: 1 Mod  Brysan Mcevoy Bing [place]ume, OTR/L [facility] Acute Rehab  Fotini Lemus Elane Bing Plume 09/06/2022, 11:22 AM

## 2022-09-06 NOTE — Evaluation (Signed)
Physical Therapy Evaluation Patient Details Name: Albert Powell MRN: [id number] DOB: Jan 01, 1941 Today's Date: 09/06/2022  History of Present Illness  Pt is an 81 y.o. male admitted 09/04/22 after possible syncopal episode at home. Workup for AKI on CKD. PMH includes CAD, chronic diastolic CHF, HTN, HLD, A-fib, COPD, DM2, dementia.   Clinical Impression  Pt presents with an overall decrease in functional mobility secondary to above. PTA, pt reports mod indep limited ambulator with rollator due to frequent falls from "lower legs giving out" without warning; pt lives with wife who has dementia, though reports she is the driver and indep with ADL/iADLs. Today, pt indep with bed mobility and sitting EOB activity, declines standing activity and is frustrated during PT Evaluation. BLE strength and sensation testing WFL, though apparent bilateral foot neuropathy. Increased time discussing falls and DME needs, pt interested in w/c for community mobility. Pt reports "syncopal" episode was actually legs giving out. Pt would benefit from continued acute PT services to maximize functional mobility and independence prior to d/c with HHPT services.      Recommendations for follow up therapy are one component of a multi-disciplinary discharge planning process, led by the attending physician.  Recommendations may be updated based on patient status, additional functional criteria and insurance authorization.  Follow Up Recommendations Home health PT      Assistance Recommended at Discharge Intermittent Supervision/Assistance  Patient can return home with the following  A little help with walking and/or transfers;A little help with bathing/dressing/bathroom;Assistance with cooking/housework;Assist for transportation;Help with stairs or ramp for entrance    Equipment Recommendations Wheelchair (measurements PT);Wheelchair cushion (measurements PT)  Recommendations for Other Services       Functional Status  Assessment Patient has had a recent decline in their functional status and demonstrates the ability to make significant improvements in function in a reasonable and predictable amount of time.     Precautions / Restrictions Precautions Precautions: Fall Restrictions Weight Bearing Restrictions: No      Mobility  Bed Mobility Overal bed mobility: Independent                  Transfers                   General transfer comment: pt declines, wanting to eat breakfast    Ambulation/Gait                  Stairs            Wheelchair Mobility    Modified Rankin (Stroke Patients Only)       Balance Overall balance assessment: Needs assistance   Sitting balance-Leahy Scale: Good                                       Pertinent Vitals/Pain Pain Assessment Pain Assessment: Faces Faces Pain Scale: No hurt Pain Intervention(s): Monitored during session    Home Living Family/patient expects to be discharged to:: Private residence Living Arrangements: Spouse/significant other Available Help at Discharge: Family;Available 24 hours/day Type of Home: Apartment Home Access: Level entry       Home Layout: One level Home Equipment: Rollator (4 wheels);Shower seat;Cane - single point Additional Comments: reports wife has dementia, but she does the driving and is fairly indep at home    Prior Function Prior Level of Function : Independent/Modified Independent  Mobility Comments: reports mod indep ambulating with rollator; limited distances due to "lower legs give out" without warning, has been happening randomly the past year, typically able to get up from fall, has chairs set around house to keep distance walked short ADLs Comments: reports indep with ADL/iADLs; does not drive; wife does majority of cooking     Hand Dominance        Extremity/Trunk Assessment   Upper Extremity Assessment Upper Extremity  Assessment: Overall WFL for tasks assessed    Lower Extremity Assessment Lower Extremity Assessment: Overall WFL for tasks assessed (gross strength testing 4-5/5 bilateral, light tough in tact though slightly diminished in bilateral feet)       Communication   Communication: No difficulties  Cognition Arousal/Alertness: Awake/alert Behavior During Therapy: Flat affect Overall Cognitive Status: No family/caregiver present to determine baseline cognitive functioning                                 General Comments: WFL for simple tasks, not formally assessed. pt clearly frustrated by questions/evaluation though answering appropriately        General Comments General comments (skin integrity, edema, etc.): HR 50s while seated EOB. increased time discussing falls at home, including inability to walk long distances, pt interested in recommendation for manual w/c as well as HHPT services; reports HHPT has been helpful in the past    Exercises     Assessment/Plan    PT Assessment Patient needs continued PT services  PT Problem List Decreased activity tolerance;Decreased balance;Decreased mobility;Decreased knowledge of use of DME       PT Treatment Interventions DME instruction;Gait training;Functional mobility training;Therapeutic activities;Therapeutic exercise;Balance training;Patient/family education;Wheelchair mobility training    PT Goals (Current goals can be found in the Care Plan section)  Acute Rehab PT Goals Patient Stated Goal: return home PT Goal Formulation: With patient Time For Goal Achievement: 09/20/22 Potential to Achieve Goals: Good    Frequency Min 3X/week     Co-evaluation               AM-PAC PT "6 Clicks" Mobility  Outcome Measure Help needed turning from your back to your side while in a flat bed without using bedrails?: None Help needed moving from lying on your back to sitting on the side of a flat bed without using bedrails?:  None Help needed moving to and from a bed to a chair (including a wheelchair)?: A Little Help needed standing up from a chair using your arms (e.g., wheelchair or bedside chair)?: A Little Help needed to walk in hospital room?: A Little Help needed climbing 3-5 steps with a railing? : A Lot 6 Click Score: 19    End of Session   Activity Tolerance: Other (comment) (limited by declining standing activity) Patient left: in bed;with call bell/phone within reach Nurse Communication: Mobility status PT Visit Diagnosis: Other abnormalities of gait and mobility (R26.89);Repeated falls (R29.6)    Time: 7124-5809 PT Time Calculation (min) (ACUTE ONLY): 17 min   Charges:   PT Evaluation $PT Eval Moderate Complexity: 1 Mod        Ina Homes, PT, DPT Acute Rehabilitation Services  Personal: Secure Chat Rehab Office: [phone]  Malachy Chamber 09/06/2022, 8:14 AM

## 2022-09-06 NOTE — Progress Notes (Signed)
Progress Note   Patient: Albert Powell TFT:[id number] DOB: 1940-12-13 DOA: 09/04/2022     0 DOS: the patient was seen and examined on 09/06/2022   Brief hospital course: Albert Powell was admitted to the hospital with the working diagnosis of AKI on CKD    81 y.o. male with medical history significant for chronic combined systolic and diastolic CHF (EF 70-62%) (has cardiomems implanted on 06/2022), CAD s/p PCI, PAF on Eliquis, carotid artery disease, PAD, CKD stage IIIb, T2DM, HTN, HLD, hypothyroidism, and dementia. Apparently patient had a syncope episode at home, EMS was called and patient was transported to the ED. Patient did not recall further details. On his initial physical examination his blood pressure was 149/62, HR 50, RR 18 and 02 saturation 100% on room air. Moist mucous membranes, heart with S1 and S2 present irregularly irregular with no gallops, respiratory with no rales or wheezing, abdomen with no distention, no lower extremity edema.   Na 130, K 3,7 Cl 92 bicarbonate 24 glucose 158 bun 25 cr 2,51  BNP 226  High sensitive troponin 26 and 21  Lactic acid 4,1 and 1.6  Wbc 12.3 hgb 13,9 plt 302   Chest radiograph with mild cardiomegaly with no infiltrates or effusions.   EKG 51 bpm, left axis deviation, normal intervals, sinus rhythm, with no significant ST segment or T wave changes.   Patient was placed on gently IV fluids for volume resuscitation.   12/23 renal function has been improving, patient continue to be bradycardic.   Assessment and Plan: * Acute kidney injury superimposed on chronic kidney disease ([name]) Hyponatremia, hypokalemia.  CKD stage 3b.   Patient with improvement in his volume status, renal function with serum cr at 1,92 with K at 4,2 and serum bicarbonate at 26 Na 132 Cl 98  Likely resume diuretic therapy tomorrow.   Near syncope Possible syncope at home. Patient with sinus bradycardia and hypovolemic on admission.  He has tolerated well  IV fluids.   Telemetry continue sinus bradycardia 45 to 55, with first degree AV block.  Plan to continue carvedilol with holding parameters.  Continue telemetry monitoring.  Continue with amiodarone.    Chronic combined systolic and diastolic CHF (congestive heart failure) ([name]) Echocardiogram with preserved LV systolic function with EF 55 to 60%. RV systolic function preserved,   Patient clinically euvolemic.  Continue amiodarone for atrial fibrillation rate control.  Carvedilol reduced to 6.25 mg bid with holding parameters. Continue with hydralazine and isosorbide for after load reduction.  No RAS inhibition due to reduced GFR.   Paroxysmal atrial fibrillation ([name]) Patient continue sinus bradycardia, with first degree AV block, with rate 45 to 55 bpm. Plan to continue amiodarone and anticoagulation with apixaban. Carvedilol with reduced dose and holding parameters.   Essential hypertension Continue blood pressure control with amlodipine, and after load reduction with hydralazine and isosorbide.  Type 2 diabetes mellitus with hyperlipidemia ([name]) Continue glucose cover and monitoring with insulin sliding scale.   Continue atorvastatin.  Coronary artery disease involving native coronary artery of native heart without angina pectoris Patient currently with no chest pain.  Acute coronary syndrome has been ruled out.  Continue blood pressure control and atorovastatin.   Acquired hypothyroidism Continue with levothyroxine.   Lesion of right native kidney Indeterminate lesion of the posterior right kidney measuring 1.4 cm noted on CT renal stone study.  Nonemergent renal protocol MRI recommended for further evaluation.        Subjective: Patient is feeling better,  no dyspnea or chest pain. Tolerating po well.   Physical Exam: Vitals:   09/05/22 1954 09/06/22 0039 09/06/22 0336 09/06/22 0823  BP: (!) 141/47 (!) 151/55 (!) 148/62 (!) 135/55  Pulse: (!) 54 (!) 56 (!) 56  (!) 50  Resp: 18 17 19 16   Temp: 97.6 F (36.4 C) 98.3 F (36.8 C) 98.3 F (36.8 C) 98.9 F (37.2 C)  TempSrc: Oral Oral Oral Oral  SpO2: 91% 96% 97% 97%  Weight:  91.3 kg     Neurology awake and alert. ENT with no pallor Cardiovascular with S1 and S2 present and rhythmic, bradycardic with no rubs or murmurs Respiratory with no rales or wheezing Abdomen with no distention  No lower extremity edema  Data Reviewed:    Family Communication: no family at the bedside   Disposition: Status is: Observation The patient remains OBS appropriate and will d/c before 2 midnights.  Planned Discharge Destination: Home     Author: , MD 09/06/2022 10:52 AM  For on call review [email or web address].

## 2022-09-07 DIAGNOSIS — N189 Chronic kidney disease, unspecified: Secondary | ICD-10-CM | POA: Diagnosis not present

## 2022-09-07 DIAGNOSIS — R55 Syncope and collapse: Secondary | ICD-10-CM | POA: Diagnosis not present

## 2022-09-07 DIAGNOSIS — I5042 Chronic combined systolic (congestive) and diastolic (congestive) heart failure: Secondary | ICD-10-CM | POA: Diagnosis not present

## 2022-09-07 DIAGNOSIS — I480 Paroxysmal atrial fibrillation: Secondary | ICD-10-CM | POA: Diagnosis not present

## 2022-09-07 DIAGNOSIS — N179 Acute kidney failure, unspecified: Secondary | ICD-10-CM | POA: Diagnosis not present

## 2022-09-07 LAB — BASIC METABOLIC PANEL
Anion gap: 10 (ref 5–15)
BUN: 21 mg/dL (ref 8–23)
CO2: 24 mmol/L (ref 22–32)
Calcium: 8.8 mg/dL — ABNORMAL LOW (ref 8.9–10.3)
Chloride: 98 mmol/L (ref 98–111)
Creatinine, Ser: 1.81 mg/dL — ABNORMAL HIGH (ref 0.61–1.24)
GFR, Estimated: 37 mL/min — ABNORMAL LOW (ref >60)
Glucose, Bld: 122 mg/dL — ABNORMAL HIGH (ref 70–99)
Potassium: 3.7 mmol/L (ref 3.5–5.1)
Sodium: 132 mmol/L — ABNORMAL LOW (ref 135–145)

## 2022-09-07 LAB — GLUCOSE, CAPILLARY
Glucose-Capillary: 109 mg/dL — ABNORMAL HIGH (ref 70–99)
Glucose-Capillary: 127 mg/dL — ABNORMAL HIGH (ref 70–99)

## 2022-09-07 MED ORDER — TORSEMIDE 20 MG PO TABS: 20.0000 mg | ORAL_TABLET | Freq: Every day | ORAL | 4 refills | Status: DC

## 2022-09-07 MED ORDER — TORSEMIDE 20 MG PO TABS
20.0000 mg | ORAL_TABLET | Freq: Every day | ORAL | Status: DC
  Administered 2022-09-07: 20 mg via ORAL
  Filled 2022-09-07: qty 1

## 2022-09-07 NOTE — Progress Notes (Signed)
Rounding Note    Patient Name: Albert Powell Date of Encounter: 09/07/2022  [place] Cardiologist: Quay Burow, MD   Subjective   No complaints  Inpatient Medications    Scheduled Meds:  amiodarone  200 mg Oral Daily   amLODipine  10 mg Oral Daily   apixaban  2.5 mg Oral BID   atorvastatin  40 mg Oral Daily   carvedilol  6.25 mg Oral BID WC   hydrALAZINE  100 mg Oral Q8H   insulin aspart  0-9 Units Subcutaneous TID WC   isosorbide mononitrate  120 mg Oral Daily   levothyroxine  75 mcg Oral Q0600   sodium chloride flush  3 mL Intravenous Q12H   Continuous Infusions:  PRN Meds: acetaminophen **OR** acetaminophen, ondansetron **OR** ondansetron (ZOFRAN) IV, senna-docusate   Vital Signs    Vitals:   09/06/22 1944 09/07/22 0002 09/07/22 0536 09/07/22 0802  BP: 124/68 (!) 155/46 (!) 154/49 (!) 153/46  Pulse: (!) 54 (!) 59 (!) 53 (!) 59  Resp: _0 Temp: 97.9 F (36.6 C) 98.2 F (36.8 C) 98.6 F (37 C) 98 F (36.7 C)  TempSrc: Oral Oral Oral Oral  SpO2: 97% 97% 94% 94%  Weight:  92.3 kg      Intake/Output Summary (Last 24 hours) at 09/07/2022 0900 Last data filed at 09/07/2022 0539 Gross per 24 hour  Intake --  Output 1550 ml  Net -1550 ml      09/07/2022   12:02 AM 09/06/2022   12:39 AM 09/04/2022    9:38 AM  Last 3 Weights  Weight (lbs) 203 lb 7.8 oz 201 lb 4.5 oz 196 lb 12.8 oz  Weight (kg) 92.3 kg 91.3 kg 89.268 kg      Telemetry    Sinus brads 50s - Personally Reviewed  ECG    N/a - Personally Reviewed  Physical Exam   GEN: No acute distress.   Neck: No JVD Cardiac: RRR, no murmurs, rubs, or gallops.  Respiratory: Clear to auscultation bilaterally. GI: Soft, nontender, non-distended  MS: No edema; No deformity. Neuro:  Nonfocal  Psych: Normal affect   Labs    High Sensitivity Troponin:   Recent Labs  Lab 08/28/22 1915 08/28/22 2045 09/04/22 1252 09/04/22 1406  TROPONINIHS 24* 20* 26* 21*      Chemistry Recent Labs  Lab 09/04/22 1252 09/05/22 0054 09/06/22 0043 09/07/22 0034  NA 130* 134* 132* 132*  K 3.7 2.8* 4.2 3.7  CL 92* 96* 98 98  CO2 _1 GLUCOSE 158* 148* 131* 122*  BUN 25* 22 24* 21  CREATININE 2.51* 2.13* 1.92* 1.81*  CALCIUM 9.0 8.5* 8.9 8.8*  PROT 6.2*  --   --   --   ALBUMIN 3.5  --   --   --   AST 23  --   --   --   ALT 23  --   --   --   ALKPHOS 76  --   --   --   BILITOT 1.1  --   --   --   GFRNONAA 25* 31* 35* 37*  ANIONGAP _2 Lipids No results for input(s): "CHOL", "TRIG", "HDL", "LABVLDL", "LDLCALC", "CHOLHDL" in the last 168 hours.  Hematology Recent Labs  Lab 09/04/22 1252 09/05/22 0054  WBC 12.3* 8.7  RBC 4.78 4.47  HGB 13.9 13.3  HCT 41.7 39.0  MCV 87.2 87.2  MCH 29.1 29.8  MCHC 33.3 34.1  RDW 14.2 14.0  PLT 302 246   Thyroid No results for input(s): "TSH", "FREET4" in the last 168 hours.  BNP Recent Labs  Lab 09/04/22 1252  BNP 226.5*    DDimer No results for input(s): "DDIMER" in the last 168 hours.   Radiology    ECHOCARDIOGRAM COMPLETE  Result Date: 09/05/2022    ECHOCARDIOGRAM REPORT   Patient Name:   JEFFRIE LOFSTROM Date of Exam: 09/05/2022 Medical Rec #:  [id number]        Height:       69.0 in Accession #:    [id number]       Weight:       196.8 lb Date of Birth:  26-Jan-1941        BSA:          2.052 m Patient Age:    42 years         BP:           158/61 mmHg Patient Gender: M                HR:           47 bpm. Exam Location:  Inpatient Procedure: 2D Echo Indications:    acute systolic chf  History:        Patient has prior history of Echocardiogram examinations, most                 recent 05/14/2022. CHF, CAD, PAD, Arrythmias:Atrial Fibrillation;                 Risk Factors:Former Smoker, Diabetes, Hypertension and                 Dyslipidemia.  Sonographer:    Johny Chess RDCS Referring Phys: [place] Comments: Image acquisition challenging due to patient body  habitus. IMPRESSIONS  1. LV wall assessment is challenging with windows; grossly no WMA. Left ventricular ejection fraction, by estimation, is 55 to 60%. The left ventricle has normal function. Left ventricular diastolic parameters are indeterminate.  2. Right ventricular systolic function is normal. The right ventricular size is normal. Tricuspid regurgitation signal is inadequate for assessing PA pressure.  3. No evidence of mitral valve regurgitation.  4. Aortic valve regurgitation is not visualized. Aortic valve sclerosis/calcification is present, without any evidence of aortic stenosis.  5. The inferior vena cava is normal in size with greater than 50% respiratory variability, suggesting right atrial pressure of 3 mmHg. Conclusion(s)/Recommendation(s): Compared to prior study 05/14/2022, LVEF has improved. FINDINGS  Left Ventricle: LV wall assessment is challenging with windows; grossly no WMA. Left ventricular ejection fraction, by estimation, is 55 to 60%. The left ventricle has normal function. The left ventricular internal cavity size was normal in size. There is no left ventricular hypertrophy. Left ventricular diastolic parameters are indeterminate. Right Ventricle: The right ventricular size is normal. Right ventricular systolic function is normal. Tricuspid regurgitation signal is inadequate for assessing PA pressure. Left Atrium: Left atrial size was normal in size. Right Atrium: Right atrial size was normal in size. Pericardium: There is no evidence of pericardial effusion. Mitral Valve: Mild to moderate mitral annular calcification. No evidence of mitral valve regurgitation. Tricuspid Valve: Tricuspid valve regurgitation is not demonstrated. Aortic Valve: Aortic valve regurgitation is not visualized. Aortic valve sclerosis/calcification is present, without any evidence of aortic stenosis. Pulmonic Valve: Pulmonic valve regurgitation is not visualized. Aorta: The aortic root and ascending aorta are  structurally normal, with no evidence of dilitation. Venous: The inferior vena cava is normal in size with greater than 50% respiratory variability, suggesting right atrial pressure of 3 mmHg. IAS/Shunts: No atrial level shunt detected by color flow Doppler.   Diastology LV e' medial:    3.92 cm/s LV E/e' medial:  20.7 LV e' lateral:   5.00 cm/s LV E/e' lateral: 16.2  RIGHT VENTRICLE             IVC RV Basal diam:  2.70 cm     IVC diam: 1.50 cm RV S prime:     10.00 cm/s TAPSE (M-mode): 1.9 cm LEFT ATRIUM             Index        RIGHT ATRIUM           Index LA Vol (A2C):   49.7 ml 24.22 ml/m  RA Area:     13.60 cm LA Vol (A4C):   47.4 ml 23.10 ml/m  RA Volume:   29.40 ml  14.33 ml/m LA Biplane Vol: 51.7 ml 25.20 ml/m  AORTIC VALVE LVOT Vmax:   87.70 cm/s LVOT Vmean:  52.200 cm/s LVOT VTI:    0.213 m  AORTA Ao Asc diam: 3.10 cm MITRAL VALVE MV Area (PHT): 2.77 cm    SHUNTS MV Decel Time: 274 msec    Systemic VTI: 0.21 m MV E velocity: 81.10 cm/s MV A velocity: 86.20 cm/s MV E/A ratio:  0.94 Landscape architect signed by Phineas Inches Signature Date/Time: 09/05/2022/2:55:14 PM    Final     Cardiac Studies     Patient Profile     81 y/o male white male w/ h/o hypertension, chronic combined systolic and diastolic heart failure, CAD s/p stenting x2 to RCA in 2003 with residual LAD disease, carotid artery disease s/p LICA endarterectomy 12/8014, LE peripheral arterial disease, hyperlipidemia, T2DM, renal artery stenosis,CKD IIIb dementia and poor compliance, presenting w/ atypical CP and found to be dehydrated w/ AKI. HS trop not c/w ACS.    Assessment & Plan    1.Chest pain/CAD - history of CAD with stenting to RCA x 2 in 2003,residual LAD disease at the time - presented with atypical chest pain - mild flat chronically elevated troponin not consistent with ACS. EKG no acute ischemic changes - CKD and dementia would in general affect considerations for ischemic testing.    - no plans for  ischemic testing at this time.    2. Chronic combined sysotlic/diastolic HF - - Echo 5/53: normal LVEF 50-55%, G1DD and normal RV - Echo 8/23 drop in EF, down to 25-30%, RV normal. Suspect tachymediated from Afib  - RHC 10/23, normal filling pressures and output. CardioMEMs placed but pt not compliant w/ daily transmissions    - presented hypovolemic with AKI - diuretics held, gentle IVFs. Cr is improving. Had been on torsemide 26m daily at home, would lower to 280mdaily. Start torsemide 2019maily.  - in general long term HF meds limited by CKD. Currently on coreg, hydral/imdur   3. Bradycardia - coreg lowered to 12.5mg22md, AM dose held due to low HRs. Change PM dose to 6.25mg32mh hold parameters - HRs improved to s50s to 60s however has not gotten any coreg doses as hold parameters were met. Would remain off coreg at this time, could consider retrying low dose as outpatient.     4. PAF - on amio, coreg, eliquis 2.5mg b22m  5. AKI on CKD -  improving with holding diuretics, IVFs - baseline Cr variable over the last few months 1.6 to 2.   No additional cardiology recs, we will sign off inpatient care and arrange outpatient f/u  For questions or updates, please contact [place] Please consult [email or web address] for contact info under        Signed, Carlyle Dolly, MD  09/07/2022, 9:00 AM

## 2022-09-07 NOTE — TOC Transition Note (Signed)
Transition of Care [facility]) - CM/SW Discharge Note   Patient Details  Name: Albert Powell MRN: [id number] Date of Birth: June 23, 1941  [facility] CM/SW Contact:  Lawerance Sabal, RN Phone Number: 09/07/2022, 10:58 AM   Clinical Narrative:     WC will be delivered through [employer] to the room so patient can take it home w him [facility] will resume through [facility].  Patient states that his wife will provide transportation home        Patient Goals and CMS Choice      Discharge Placement                         Discharge Plan and Services Additional resources added to the After Visit Summary for                                       Social Determinants of Health (SDOH) Interventions SDOH Screenings   Food Insecurity: No Food Insecurity (08/14/2022)  Housing: Low Risk  (08/14/2022)  Transportation Needs: Unmet Transportation Needs (08/21/2022)  Utilities: Not At Risk (08/14/2022)  Alcohol Screen: Low Risk  (06/03/2022)  Depression (PHQ2-9): High Risk (08/14/2022)  Financial Resource Strain: Low Risk  (06/03/2022)  Tobacco Use: Medium Risk (08/28/2022)     Readmission Risk Interventions     No data to display

## 2022-09-07 NOTE — Care Management (Cosign Needed)
    Durable Medical Equipment  (From admission, onward)           Start     Ordered   09/07/22 1058  For home use only DME lightweight manual wheelchair with seat cushion  Once       Comments: Patient suffers from weakness which impairs their ability to perform daily activities like bathing in the home.  A cane will not resolve  issue with performing activities of daily living. A wheelchair will allow patient to safely perform daily activities. Patient is not able to propel themselves in the home using a standard weight wheelchair due to general weakness. Patient can self propel in the lightweight wheelchair. Length of need Lifetime. Accessories: elevating leg rests (ELRs), wheel locks, extensions and anti-tippers.  Height 5'9' Weight approx 200 pounds, 92.3 KG   09/07/22 1057

## 2022-09-07 NOTE — Discharge Summary (Addendum)
Physician Discharge Summary   Patient: Albert Powell MRN: [id number] DOB: [birth year]  Admit date:     09/04/2022  Discharge date: 09/07/22  Discharge Physician: York Ram Keola Heninger   PCP: Patient, No Pcp Per   Recommendations at discharge:    Carvedilol has been discontinued due to bradycardia. Torsemide decreased to 20 mg daily to prevent hypovolemic. Holding spironolactone until renal function more stable.  His LV systolic function has improved.  Follow up renal function in 7 days. Follow up with primary care in 7 to 10 days.  Follow up with Cardiology as scheduled.   Discharge Diagnoses: Principal Problem:   Acute kidney injury superimposed on chronic kidney disease ([name]) Active Problems:   Near syncope   Chronic combined systolic and diastolic CHF (congestive heart failure) ([name])   Paroxysmal atrial fibrillation ([name])   Essential hypertension   Type 2 diabetes mellitus with hyperlipidemia ([name])   Coronary artery disease involving native coronary artery of native heart without angina pectoris   Acquired hypothyroidism   Lesion of right native kidney  Resolved Problems:   * No resolved hospital problems. *  Hospital Course: Albert Powell was admitted to the hospital with the working diagnosis of AKI on CKD    81 y.o. male with medical history significant for chronic combined systolic and diastolic CHF (EF 40-98%) (has cardiomems implanted on 06/2022), CAD s/p PCI, PAF on Eliquis, carotid artery disease, PAD, CKD stage IIIb, T2DM, HTN, HLD, hypothyroidism, and dementia.  Apparently patient had a syncope episode at home. He was found down in the bathroom by his wife. EMS was called and patient was transported to the ED. Patient did not recall further details. On his initial physical examination his blood pressure was 149/62, HR 50, RR 18 and 02 saturation 100% on room air. Moist mucous membranes, heart with S1 and S2 present irregularly irregular with no gallops,  respiratory with no rales or wheezing, abdomen with no distention, no lower extremity edema.   Na 130, K 3,7 Cl 92 bicarbonate 24 glucose 158 bun 25 cr 2,51  BNP 226  High sensitive troponin 26 and 21  Lactic acid 4,1 and 1.6  Wbc 12.3 hgb 13,9 plt 302   Chest radiograph with mild cardiomegaly with no infiltrates or effusions.   EKG 51 bpm, left axis deviation, normal intervals, sinus rhythm, with no significant ST segment or T wave changes.   Patient was placed on gently IV fluids for volume resuscitation.   12/23 renal function has been improving, patient continue to be bradycardic.  Follow up echocardiogram with improvement in LV systolic function.  12/24 B blocker was held with improvement in his heart rate. His renal function continue to improve and he is clinically euvolemic. Plan to discharge home with reduced dose of furosemide and continue to hold on carvedilol.  Follow up as outpatient.   Assessment and Plan: * Acute kidney injury superimposed on chronic kidney disease ([name]) Hyponatremia, hypokalemia.  CKD stage 3b.   Patient had diuretic therapy held and received IV fluids with good toleration.  At the time of his discharge his renal function has a serum cr of 1.8 with K at 3,7 and serum bicarbonate at 24. Na 132 and Cl 98.   Patient will continue diuretic therapy at home with reduced dose of torsemide 20 mg po daily.  Follow up renal function as outpatient in 7 days.  Continue to  hold on spironolactone for now.    Near syncope Syncope at home. Multifactorial,  hypovolemic, vasovagal, bradycardia.  He has tolerated well IV fluids.   Patient was placed on bedside telemetry, he had sinus bradycardia with 1st degree AV block and occasional PAC. Carvedilol was held with improvement in heart rate, at the time of his discharge is at 60 bpm range.   Plan to hold on carvedilol and to continue with amiodarone.    Chronic combined systolic and diastolic CHF (congestive  heart failure) ([name]) Echocardiogram with preserved LV systolic function with EF 55 to 60%. RV systolic function preserved. Improved LV systolic function from 04/2022  Patient clinically euvolemic.  Continue amiodarone for atrial fibrillation rate control.   Hydralazine and isosorbide for after load reduction.  Continue to hold on RAS inhibition due to reduced GFR.   Paroxysmal atrial fibrillation ([name]) Patient has remained in sinus rhythm with 1st degree AV block. Heart rate has improved to 60 bpm.  Continue amiodarone and anticoagulation with apixaban.  Discontinue carvedilol.   Essential hypertension Continue blood pressure control with amlodipine, and after load reduction with hydralazine and isosorbide.  Type 2 diabetes mellitus with hyperlipidemia ([name]) His glucose remained stable during his hospitalization, at the time of his discharge his fasting glucose is 122 mg/dl.   Continue atorvastatin.  Coronary artery disease involving native coronary artery of native heart without angina pectoris Patient currently with no chest pain.  Acute coronary syndrome has been ruled out.  Continue blood pressure control and atorovastatin.  On clopidogrel.  Acquired hypothyroidism Continue with levothyroxine.   Lesion of right native kidney Indeterminate lesion of the posterior right kidney measuring 1.4 cm noted on CT renal stone study.  Nonemergent renal protocol MRI recommended for further evaluation.         Consultants: cardiology  Procedures performed: none   Disposition: Home Diet recommendation:  Cardiac and Carb modified diet DISCHARGE MEDICATION: Allergies as of 09/07/2022       Reactions   Fentanyl Other (See Comments)   Behavioral changes   Gabapentin Swelling   Lisinopril Other (See Comments)   Unknown reaction   Lyrica [pregabalin] Other (See Comments)   Unknown reaction   Metformin Diarrhea   Propofol Other (See Comments)   Heart rate dropped         Medication List     STOP taking these medications    carvedilol 25 MG tablet Commonly known as: COREG   potassium chloride 10 MEQ tablet Commonly known as: KLOR-CON   spironolactone 25 MG tablet Commonly known as: ALDACTONE       TAKE these medications    amiodarone 200 MG tablet Commonly known as: PACERONE Take 1 tablet (200 mg total) by mouth 2 (two) times daily.   amLODipine 10 MG tablet Commonly known as: NORVASC Take 1 tablet (10 mg total) by mouth daily.   apixaban 2.5 MG Tabs tablet Commonly known as: ELIQUIS Take 1 tablet (2.5 mg total) by mouth 2 (two) times daily.   atorvastatin 40 MG tablet Commonly known as: LIPITOR Take 1 tablet (40 mg total) by mouth daily.   clopidogrel 75 MG tablet Commonly known as: PLAVIX Take 1 tablet (75 mg total) by mouth daily.   glipiZIDE 5 MG tablet Commonly known as: GLUCOTROL Take 5 mg by mouth daily before breakfast.   hydrALAZINE 100 MG tablet Commonly known as: APRESOLINE Take 1 tablet (100 mg total) by mouth 3 (three) times daily.   isosorbide mononitrate 120 MG 24 hr tablet Commonly known as: IMDUR Take 1 tablet (120 mg total) by mouth daily.  levothyroxine 25 MCG tablet Commonly known as: SYNTHROID TAKE ONE TABLET BY MOUTH DAILY BEFORE BREAKFAST What changed: See the new instructions.   ondansetron 4 MG disintegrating tablet Commonly known as: ZOFRAN-ODT Take 1 tablet (4 mg total) by mouth every 8 (eight) hours as needed for up to 15 doses for nausea or vomiting.   pantoprazole 40 MG tablet Commonly known as: PROTONIX Take 1 tablet (40 mg total) by mouth every other day.   torsemide 20 MG tablet Commonly known as: DEMADEX Take 1 tablet (20 mg total) by mouth daily. What changed: how much to take        Discharge Exam: Filed Weights   09/06/22 0039 09/07/22 0002  Weight: 91.3 kg 92.3 kg   BP (!) 153/46 (BP Location: Right Arm)   Pulse (!) 59   Temp 98 F (36.7 C) (Oral)   Resp 18   Wt  92.3 kg   SpO2 94%   BMI 30.05 kg/m   Patient with improvement in his symptoms, no dizziness or orthostatic symptoms. No chest pain, no dyspnea.  Neurology awake and alert ENT with no pallor Cardiovascular with S1 and S2 present and rhythmic with no gallops, rubs or murmurs Respiratory with no rales or wheezing, no rhonchi Abdomen with no distention  No lower extremity edema   Condition at discharge: stable  The results of significant diagnostics from this hospitalization (including imaging, microbiology, ancillary and laboratory) are listed below for reference.   Imaging Studies: ECHOCARDIOGRAM COMPLETE  Result Date: 09/05/2022    ECHOCARDIOGRAM REPORT   Patient Name:   Albert Powell Date of Exam: 09/05/2022 Medical Rec #:  [id number]        Height:       69.0 in Accession #:    [id number]       Weight:       196.8 lb Date of Birth:  14-Jun-1941        BSA:          2.052 m Patient Age:    81 years         BP:           158/61 mmHg Patient Gender: M                HR:           47 bpm. Exam Location:  Inpatient Procedure: 2D Echo Indications:    acute systolic chf  History:        Patient has prior history of Echocardiogram examinations, most                 recent 05/14/2022. CHF, CAD, PAD, Arrythmias:Atrial Fibrillation;                 Risk Factors:Former Smoker, Diabetes, Hypertension and                 Dyslipidemia.  Sonographer:    Delcie Roch RDCS Referring Phys: [phone] Eliot Ford [facility]  Sonographer Comments: Image acquisition challenging due to patient body habitus. IMPRESSIONS  1. LV wall assessment is challenging with windows; grossly no WMA. Left ventricular ejection fraction, by estimation, is 55 to 60%. The left ventricle has normal function. Left ventricular diastolic parameters are indeterminate.  2. Right ventricular systolic function is normal. The right ventricular size is normal. Tricuspid regurgitation signal is inadequate for assessing PA pressure.  3. No evidence of  mitral valve regurgitation.  4. Aortic valve regurgitation is not visualized. Aortic valve sclerosis/calcification is  present, without any evidence of aortic stenosis.  5. The inferior vena cava is normal in size with greater than 50% respiratory variability, suggesting right atrial pressure of 3 mmHg. Conclusion(s)/Recommendation(s): Compared to prior study 05/14/2022, LVEF has improved. FINDINGS  Left Ventricle: LV wall assessment is challenging with windows; grossly no WMA. Left ventricular ejection fraction, by estimation, is 55 to 60%. The left ventricle has normal function. The left ventricular internal cavity size was normal in size. There is no left ventricular hypertrophy. Left ventricular diastolic parameters are indeterminate. Right Ventricle: The right ventricular size is normal. Right ventricular systolic function is normal. Tricuspid regurgitation signal is inadequate for assessing PA pressure. Left Atrium: Left atrial size was normal in size. Right Atrium: Right atrial size was normal in size. Pericardium: There is no evidence of pericardial effusion. Mitral Valve: Mild to moderate mitral annular calcification. No evidence of mitral valve regurgitation. Tricuspid Valve: Tricuspid valve regurgitation is not demonstrated. Aortic Valve: Aortic valve regurgitation is not visualized. Aortic valve sclerosis/calcification is present, without any evidence of aortic stenosis. Pulmonic Valve: Pulmonic valve regurgitation is not visualized. Aorta: The aortic root and ascending aorta are structurally normal, with no evidence of dilitation. Venous: The inferior vena cava is normal in size with greater than 50% respiratory variability, suggesting right atrial pressure of 3 mmHg. IAS/Shunts: No atrial level shunt detected by color flow Doppler.   Diastology LV e' medial:    3.92 cm/s LV E/e' medial:  20.7 LV e' lateral:   5.00 cm/s LV E/e' lateral: 16.2  RIGHT VENTRICLE             IVC RV Basal diam:  2.70 cm     IVC  diam: 1.50 cm RV S prime:     10.00 cm/s TAPSE (M-mode): 1.9 cm LEFT ATRIUM             Index        RIGHT ATRIUM           Index LA Vol (A2C):   49.7 ml 24.22 ml/m  RA Area:     13.60 cm LA Vol (A4C):   47.4 ml 23.10 ml/m  RA Volume:   29.40 ml  14.33 ml/m LA Biplane Vol: 51.7 ml 25.20 ml/m  AORTIC VALVE LVOT Vmax:   87.70 cm/s LVOT Vmean:  52.200 cm/s LVOT VTI:    0.213 m  AORTA Ao Asc diam: 3.10 cm MITRAL VALVE MV Area (PHT): 2.77 cm    SHUNTS MV Decel Time: 274 msec    Systemic VTI: 0.21 m MV E velocity: 81.10 cm/s MV A velocity: 86.20 cm/s MV E/A ratio:  0.94 Photographer signed by Carolan Clines Signature Date/Time: 09/05/2022/2:55:14 PM    Final    CT Renal Stone Study  Result Date: 09/04/2022 CLINICAL DATA:  Flank pain EXAM: CT ABDOMEN AND PELVIS WITHOUT CONTRAST TECHNIQUE: Multidetector CT imaging of the abdomen and pelvis was performed following the standard protocol without IV contrast. RADIATION DOSE REDUCTION: This exam was performed according to the departmental dose-optimization program which includes automated exposure control, adjustment of the mA and/or kV according to patient size and/or use of iterative reconstruction technique. COMPARISON:  CT abdomen and pelvis dated May 28, 2011 FINDINGS: Lower chest: Coronary artery calcifications. Small to moderate hiatal hernia. No acute abnormality. Hepatobiliary: No focal liver abnormality is seen. Intraluminal hyperdensity seen within the gallbladder, likely due to sludge. No gallstones, gallbladder wall thickening, or biliary dilatation. Pancreas: Unremarkable. No pancreatic ductal dilatation or surrounding inflammatory  changes. Spleen: Normal in size without focal abnormality. Adrenals/Urinary Tract: Low-attenuation nodule of the left adrenal gland measuring 1.4 cm on series 3, image 24, unchanged when compared with prior and compatible with benign adenoma, no further follow-up imaging is recommended. Right adrenal gland is  unremarkable. No hydronephrosis. Atrophic left kidney, new when compared with the prior. Renal vascular calcifications with no definite nephrolithiasis. Bilateral renal lesions of varying complexity, most are compatible with simple or proteinaceous cysts, although there is an indeterminate lesion of the posterior right kidney measuring 1.4 cm on series 3, image 28, not definitely present on prior exam. Bladder is unremarkable. Stomach/Bowel: Stomach is within normal limits. Prior partial colectomy. No evidence of bowel wall thickening, distention, or inflammatory changes. Vascular/Lymphatic: Severe aortic atherosclerosis. Bilateral iliac artery stents. No enlarged abdominal or pelvic lymph nodes. Reproductive: Unchanged mild prostatomegaly Other: Complex ventral abdominal wall hernia which contains fat and a nondilated loop of small bowel a component located right of midline. No abdominopelvic ascites. Musculoskeletal: No acute or significant osseous findings. IMPRESSION: 1. No acute findings in the abdomen or pelvis, including no evidence of obstructive uropathy. 2. Complex ventral abdominal wall hernia which contains a nondilated loop of small bowel. 3. Indeterminate lesion of the posterior right kidney measuring 1.4 cm. Recommend further evaluation with renal protocol MRI, this can be performed non emergently. 4. Aortic Atherosclerosis (ICD10-I70.0). Electronically Signed   By: Allegra Lai M.D.   On: 09/04/2022 17:59   CT Head Wo Contrast  Result Date: 09/04/2022 CLINICAL DATA:  Head trauma EXAM: CT HEAD WITHOUT CONTRAST TECHNIQUE: Contiguous axial images were obtained from the base of the skull through the vertex without intravenous contrast. RADIATION DOSE REDUCTION: This exam was performed according to the departmental dose-optimization program which includes automated exposure control, adjustment of the mA and/or kV according to patient size and/or use of iterative reconstruction technique.  COMPARISON:  CT head 06/08/2021 FINDINGS: Brain: No evidence of acute infarction, hemorrhage, hydrocephalus, extra-axial collection or mass lesion/mass effect. Again seen is mild diffuse atrophy. There is mild periventricular white matter hypodensity, likely chronic small vessel ischemic change. There is a punctate cortical calcification in the right temporal region which likely represents dystrophic calcification, new from prior. Vascular: Atherosclerotic calcifications are present within the cavernous internal carotid arteries. Skull: Normal. Negative for fracture or focal lesion. Sinuses/Orbits: No acute finding. Other: None. IMPRESSION: 1. No acute intracranial process. 2. Mild atrophy and chronic small vessel ischemic change. Electronically Signed   By: Darliss Cheney M.D.   On: 09/04/2022 17:50   DG Chest 2 View  Result Date: 09/04/2022 CLINICAL DATA:  Chest pain and weakness EXAM: CHEST - 2 VIEW COMPARISON:  Chest 06/23/2022 FINDINGS: Heart size within normal limits. Negative for heart failure. Lungs clear without infiltrate or effusion Spinal cord stimulator midthoracic spine unchanged Foreign body in the left lower lobe likely in the pulmonary artery. This was not present previously. Foreign body measures approximately 3 x 12 mm. IMPRESSION: 1. No active cardiopulmonary disease. 2. Foreign body in the left lower lobe likely in the pulmonary artery. Electronically Signed   By: Marlan Palau M.D.   On: 09/04/2022 13:11    Microbiology: Results for orders placed or performed during the hospital encounter of 09/04/22  Urine Culture     Status: None   Collection Time: 09/04/22  5:09 PM   Specimen: Urine, Clean Catch  Result Value Ref Range Status   Specimen Description URINE, CLEAN CATCH  Final   Special Requests NONE  Final   Culture   Final    NO GROWTH Performed at [facility], [address], [place], [place] [address]    Report Status 09/05/2022 FINAL  Final     Labs: CBC: Recent Labs  Lab 09/04/22 1252 09/05/22 0054  WBC 12.3* 8.7  HGB 13.9 13.3  HCT 41.7 39.0  MCV 87.2 87.2  PLT 302 246   Basic Metabolic Panel: Recent Labs  Lab 09/04/22 1252 09/05/22 0054 09/06/22 0043 09/07/22 0034  NA 130* 134* 132* 132*  K 3.7 2.8* 4.2 3.7  CL 92* 96* 98 98  CO2 24 24 26 24   GLUCOSE 158* 148* 131* 122*  BUN 25* 22 24* 21  CREATININE 2.51* 2.13* 1.92* 1.81*  CALCIUM 9.0 8.5* 8.9 8.8*   Liver Function Tests: Recent Labs  Lab 09/04/22 1252  AST 23  ALT 23  ALKPHOS 76  BILITOT 1.1  PROT 6.2*  ALBUMIN 3.5   CBG: Recent Labs  Lab 09/06/22 0829 09/06/22 1150 09/06/22 1635 09/06/22 2108 09/07/22 0638  GLUCAP 165* 134* 141* 131* 109*    Discharge time spent: greater than 30 minutes.  Signed: 09/09/22, MD [facility] 09/07/2022

## 2022-09-07 NOTE — Plan of Care (Signed)
  Problem: Education: Goal: Knowledge of condition and prescribed therapy will improve Outcome: Progressing   Problem: Cardiac: Goal: Will achieve and/or maintain adequate cardiac output Outcome: Progressing   Problem: Coping: Goal: Ability to adjust to condition or change in health will improve Outcome: Progressing   Problem: Fluid Volume: Goal: Ability to maintain a balanced intake and output will improve Outcome: Progressing   Problem: Health Behavior/Discharge Planning: Goal: Ability to identify and utilize available resources and services will improve Outcome: Progressing Goal: Ability to manage health-related needs will improve Outcome: Progressing   Problem: Metabolic: Goal: Ability to maintain appropriate glucose levels will improve Outcome: Progressing   Problem: Nutritional: Goal: Maintenance of adequate nutrition will improve Outcome: Progressing   Problem: Skin Integrity: Goal: Risk for impaired skin integrity will decrease Outcome: Progressing   Problem: Education: Goal: Knowledge of General Education information will improve Description: Including pain rating scale, medication(s)/side effects and non-pharmacologic comfort measures Outcome: Progressing   Problem: Health Behavior/Discharge Planning: Goal: Ability to manage health-related needs will improve Outcome: Progressing   Problem: Clinical Measurements: Goal: Ability to maintain clinical measurements within normal limits will improve Outcome: Progressing Goal: Will remain free from infection Outcome: Progressing Goal: Diagnostic test results will improve Outcome: Progressing Goal: Respiratory complications will improve Outcome: Progressing Goal: Cardiovascular complication will be avoided Outcome: Progressing   Problem: Nutrition: Goal: Adequate nutrition will be maintained Outcome: Progressing   Problem: Coping: Goal: Level of anxiety will decrease Outcome: Progressing

## 2022-09-10 ENCOUNTER — Other Ambulatory Visit (HOSPITAL_COMMUNITY): Payer: Self-pay | Admitting: Emergency Medicine

## 2022-09-10 NOTE — Progress Notes (Signed)
Paramedicine Encounter    Patient ID: Albert Powell, male    DOB: 08-17-1941, 81 y.o.   MRN: [id number]   BP (!) 180/100 (BP Location: Left Arm, Patient Position: Sitting, Cuff Size: Normal)   Pulse 64   Resp 16   Wt 199 lb 12.8 oz (90.6 kg)   SpO2 98%   BMI 29.51 kg/m  Weight yesterday-not taken Last visit weight-196.12lb  ATF Albert Powell A&O x 4, skin W&D w/ good color.  Pt denies chest pain or SOB.  Lung sounds clear and equal bilat.  No edema to his lower extremities.  Med box reconciled x 1 week to reflect changes from 12/24 discharge.  Discontinued Carvedilol, Spironolactone and Potassium and decreased Torsemide to 20mg . Daily.  Call in refill for Levothyroxine and he will need to make an appointment with Dr. before refills can be prescribed per [facility].   Discussed w/ pt f/u appointment @ [facility] on 09/25/22.  Discussed with Albert Powell to importance of being compliant with all meds in order to get an accurate measure of the efficacy of his current med regimen.  Home visit complete. Pt. Needs transportation scheduled for his upcoming follow-up apointment @ the [facility] 09/25/22 @10 :[address] 09/10/2022    Patient Care Team: Patient, No Pcp Per as PCP - General (General Practice) [phone], MD as PCP - Cardiology (Cardiology) 09/12/2022, MD as Consulting Physician (Cardiology) [name], NP as [facility] (Geriatric Medicine)  Patient Active Problem List   Diagnosis Date Noted   AKI (acute kidney injury) ([name]) 09/06/2022   Chronic combined systolic and diastolic CHF (congestive heart failure) ([name]) 09/04/2022   Near syncope 09/04/2022   Hypokalemia 06/23/2022   Elevated troponin 06/23/2022   Type 2 diabetes mellitus with hyperlipidemia ([name]) 05/19/2022   Acute combined systolic and diastolic heart failure ([name])    Acute kidney injury superimposed on chronic kidney  disease ([name]) 05/13/2022   GERD (gastroesophageal reflux disease) 05/13/2022   Acquired hypothyroidism 05/13/2022   Paroxysmal atrial fibrillation ([name]) 10/08/2021   Acute metabolic encephalopathy    CHF (congestive heart failure) ([name]) 03/10/2021   Essential hypertension 03/10/2021   Hypertensive crisis 01/01/2021   ACS (acute coronary syndrome) ([name])    Lesion of right native kidney    Atrial fibrillation ([name]) 03/30/2020   Contusion of right hand 11/12/2018   Primary osteoarthritis of both first carpometacarpal joints 11/12/2018   Pain in joint of right shoulder 10/05/2018   Pain in right hand 10/05/2018   Hematoma 03/30/2018   Degeneration of lumbar intervertebral disc 12/21/2017   Lumbar radiculopathy 12/21/2017   Vertigo 04/09/2017   Hypogonadism in male 12/07/2014   Screening for prostate cancer 12/07/2014   Claudication of right lower extremity ([name]) 10/20/2013   Claudication in peripheral vascular disease- Rt leg 09/20/2013   Obesity (BMI 30-39.9)- negative sleep study in the past 09/20/2013   Occlusion and stenosis of carotid artery without mention of cerebral infarction 05/23/2013   PAD (peripheral artery disease) ([name]) 05/18/2013   Carotid artery disease ([name]) 04/14/2013   Bradycardia, sinus 02/18/2013   Tobacco abuse 02/05/2013   Chest pain, unstable angina, negative MI 02/04/2013   Coronary artery disease involving native coronary artery of native heart without angina pectoris 02/04/2013   Benign essential hypertension 07/10/2011   Incisional hernia 05/22/2011    Current Outpatient Medications:    amiodarone (PACERONE) 200 MG tablet, Take 1 tablet (200 mg  total) by mouth 2 (two) times daily., Disp: 60 tablet, Rfl: 3   amLODipine (NORVASC) 10 MG tablet, Take 1 tablet (10 mg total) by mouth daily., Disp: 30 tablet, Rfl: 11   apixaban (ELIQUIS) 2.5 MG TABS tablet, Take 1 tablet (2.5 mg total) by mouth 2 (two) times daily., Disp: 60 tablet, Rfl: 3   atorvastatin  (LIPITOR) 40 MG tablet, Take 1 tablet (40 mg total) by mouth daily., Disp: 30 tablet, Rfl: 11   clopidogrel (PLAVIX) 75 MG tablet, Take 1 tablet (75 mg total) by mouth daily., Disp: 30 tablet, Rfl: 11   glipiZIDE (GLUCOTROL) 5 MG tablet, Take 5 mg by mouth daily before breakfast., Disp: , Rfl:    isosorbide mononitrate (IMDUR) 120 MG 24 hr tablet, Take 1 tablet (120 mg total) by mouth daily., Disp: 30 tablet, Rfl: 11   levothyroxine (SYNTHROID) 25 MCG tablet, TAKE ONE TABLET BY MOUTH DAILY BEFORE BREAKFAST (Patient taking differently: Take 75 mcg by mouth daily before breakfast.), Disp: 90 tablet, Rfl: 3   ondansetron (ZOFRAN-ODT) 4 MG disintegrating tablet, Take 1 tablet (4 mg total) by mouth every 8 (eight) hours as needed for up to 15 doses for nausea or vomiting., Disp: 15 tablet, Rfl: 0   pantoprazole (PROTONIX) 40 MG tablet, Take 1 tablet (40 mg total) by mouth every other day., Disp: 30 tablet, Rfl: 3   torsemide (DEMADEX) 20 MG tablet, Take 1 tablet (20 mg total) by mouth daily., Disp: 120 tablet, Rfl: 4   hydrALAZINE (APRESOLINE) 100 MG tablet, Take 1 tablet (100 mg total) by mouth 3 (three) times daily., Disp: 90 tablet, Rfl: 0 Allergies  Allergen Reactions   Fentanyl Other (See Comments)    Behavioral changes   Gabapentin Swelling   Lisinopril Other (See Comments)    Unknown reaction   Lyrica [Pregabalin] Other (See Comments)    Unknown reaction   Metformin Diarrhea   Propofol Other (See Comments)    Heart rate dropped      Social History   Socioeconomic History   Marital status: Married    Spouse name: Kaizer Thome   Number of children: 1   Years of education: Not on file   Highest education level: High school graduate  Occupational History   Occupation: retired  Tobacco Use   Smoking status: Former    Packs/day: 1.50    Years: 57.00    Total pack years: 85.50    Types: Cigarettes, E-cigarettes    Quit date: 2020    Years since quitting: 3.9   Smokeless  tobacco: Never   Tobacco comments:    pt states that he is using the vapor cigs--12/2021  Vaping Use   Vaping Use: Former   Start date: 09/15/2016   Substances: Nicotine  Substance and Sexual Activity   Alcohol use: Yes    Alcohol/week: 1.0 standard drink of alcohol    Types: 1 Glasses of wine per week    Comment: "very little"   Drug use: No   Sexual activity: Not on file  Other Topics Concern   Not on file  Social History Narrative   Not on file   Social Determinants of Health   Financial Resource Strain: Low Risk  (06/03/2022)   Overall Financial Resource Strain (CARDIA)    Difficulty of Paying Living Expenses: Not hard at all  Food Insecurity: No Food Insecurity (08/14/2022)   Hunger Vital Sign    Worried About Running Out of Food in the Last Year: Never true  Ran Out of Food in the Last Year: Never true  Transportation Needs: Unmet Transportation Needs (08/21/2022)   PRAPARE - Hydrologist (Medical): Yes    Lack of Transportation (Non-Medical): No  Physical Activity: Not on file  Stress: Not on file  Social Connections: Not on file  Intimate Partner Violence: Not At Risk (08/14/2022)   Humiliation, Afraid, Rape, and Kick questionnaire    Fear of Current or Ex-Partner: No    Emotionally Abused: No    Physically Abused: No    Sexually Abused: No    Physical Exam      Future Appointments  Date Time Provider [place]  09/25/2022 10:30 AM [place], [place] [facility] Paramedic  09/10/22

## 2022-09-11 ENCOUNTER — Telehealth: Payer: Self-pay | Admitting: *Deleted

## 2022-09-12 DIAGNOSIS — K219 Gastro-esophageal reflux disease without esophagitis: Secondary | ICD-10-CM | POA: Diagnosis not present

## 2022-09-12 DIAGNOSIS — N289 Disorder of kidney and ureter, unspecified: Secondary | ICD-10-CM | POA: Diagnosis not present

## 2022-09-12 DIAGNOSIS — I2510 Atherosclerotic heart disease of native coronary artery without angina pectoris: Secondary | ICD-10-CM | POA: Diagnosis not present

## 2022-09-12 DIAGNOSIS — I440 Atrioventricular block, first degree: Secondary | ICD-10-CM | POA: Diagnosis not present

## 2022-09-12 DIAGNOSIS — J449 Chronic obstructive pulmonary disease, unspecified: Secondary | ICD-10-CM | POA: Diagnosis not present

## 2022-09-12 DIAGNOSIS — E785 Hyperlipidemia, unspecified: Secondary | ICD-10-CM | POA: Diagnosis not present

## 2022-09-12 DIAGNOSIS — I130 Hypertensive heart and chronic kidney disease with heart failure and stage 1 through stage 4 chronic kidney disease, or unspecified chronic kidney disease: Secondary | ICD-10-CM | POA: Diagnosis not present

## 2022-09-12 DIAGNOSIS — N179 Acute kidney failure, unspecified: Secondary | ICD-10-CM | POA: Diagnosis not present

## 2022-09-12 DIAGNOSIS — Z87891 Personal history of nicotine dependence: Secondary | ICD-10-CM | POA: Diagnosis not present

## 2022-09-12 DIAGNOSIS — M519 Unspecified thoracic, thoracolumbar and lumbosacral intervertebral disc disorder: Secondary | ICD-10-CM | POA: Diagnosis not present

## 2022-09-12 DIAGNOSIS — E039 Hypothyroidism, unspecified: Secondary | ICD-10-CM | POA: Diagnosis not present

## 2022-09-12 DIAGNOSIS — E1151 Type 2 diabetes mellitus with diabetic peripheral angiopathy without gangrene: Secondary | ICD-10-CM | POA: Diagnosis not present

## 2022-09-12 DIAGNOSIS — I5042 Chronic combined systolic (congestive) and diastolic (congestive) heart failure: Secondary | ICD-10-CM | POA: Diagnosis not present

## 2022-09-12 DIAGNOSIS — Z7984 Long term (current) use of oral hypoglycemic drugs: Secondary | ICD-10-CM | POA: Diagnosis not present

## 2022-09-12 DIAGNOSIS — E1169 Type 2 diabetes mellitus with other specified complication: Secondary | ICD-10-CM | POA: Diagnosis not present

## 2022-09-12 DIAGNOSIS — F0280 Dementia in other diseases classified elsewhere without behavioral disturbance: Secondary | ICD-10-CM | POA: Diagnosis not present

## 2022-09-12 DIAGNOSIS — Z7901 Long term (current) use of anticoagulants: Secondary | ICD-10-CM | POA: Diagnosis not present

## 2022-09-12 DIAGNOSIS — Z7902 Long term (current) use of antithrombotics/antiplatelets: Secondary | ICD-10-CM | POA: Diagnosis not present

## 2022-09-12 DIAGNOSIS — I480 Paroxysmal atrial fibrillation: Secondary | ICD-10-CM | POA: Diagnosis not present

## 2022-09-12 DIAGNOSIS — N1832 Chronic kidney disease, stage 3b: Secondary | ICD-10-CM | POA: Diagnosis not present

## 2022-09-12 DIAGNOSIS — E1122 Type 2 diabetes mellitus with diabetic chronic kidney disease: Secondary | ICD-10-CM | POA: Diagnosis not present

## 2022-09-12 DIAGNOSIS — E1136 Type 2 diabetes mellitus with diabetic cataract: Secondary | ICD-10-CM | POA: Diagnosis not present

## 2022-09-12 DIAGNOSIS — Z9181 History of falling: Secondary | ICD-10-CM | POA: Diagnosis not present

## 2022-09-12 NOTE — Telephone Encounter (Signed)
   Telephone encounter was:  Successful.  09/12/2022 Name: Albert Powell MRN: [id number] DOB: 01/04/1941  Albert Powell is a 81 y.o. year old male who is a primary care patient of Patient, No Pcp Per . The community resource team was consulted for assistance with Transportation Needs   Care guide performed the following interventions: Patient provided with information about care guide support team and interviewed to confirm resource needs. Patient booked with [facility] for 09/25/2022 appt at heart clinic he is scheduled for picked up at 855 am provided wife with information about the appt and pickup and provided the phone number for upcoming rides .[name]  Follow Up Plan:  No further follow up planned at this time. The patient has been provided with needed resources.  Yehuda Mao Greenauer -[facility] , Population Health [phone] [address] [place] , [place] [address] Email : [email or web address]

## 2022-09-17 ENCOUNTER — Inpatient Hospital Stay (HOSPITAL_COMMUNITY)
Admission: EM | Admit: 2022-09-17 | Discharge: 2022-09-25 | DRG: 682 | Disposition: A | Discharge status: Home Health | Payer: Medicare Other | Attending: Internal Medicine | Admitting: Internal Medicine

## 2022-09-17 ENCOUNTER — Other Ambulatory Visit (HOSPITAL_COMMUNITY): Payer: Self-pay | Admitting: Emergency Medicine

## 2022-09-17 ENCOUNTER — Emergency Department (HOSPITAL_COMMUNITY): Payer: Medicare Other

## 2022-09-17 DIAGNOSIS — E1151 Type 2 diabetes mellitus with diabetic peripheral angiopathy without gangrene: Secondary | ICD-10-CM | POA: Diagnosis present

## 2022-09-17 DIAGNOSIS — I10 Essential (primary) hypertension: Secondary | ICD-10-CM | POA: Diagnosis present

## 2022-09-17 DIAGNOSIS — A09 Infectious gastroenteritis and colitis, unspecified: Secondary | ICD-10-CM | POA: Diagnosis present

## 2022-09-17 DIAGNOSIS — I5032 Chronic diastolic (congestive) heart failure: Secondary | ICD-10-CM | POA: Diagnosis not present

## 2022-09-17 DIAGNOSIS — Y92239 Unspecified place in hospital as the place of occurrence of the external cause: Secondary | ICD-10-CM | POA: Diagnosis not present

## 2022-09-17 DIAGNOSIS — E871 Hypo-osmolality and hyponatremia: Secondary | ICD-10-CM | POA: Diagnosis present

## 2022-09-17 DIAGNOSIS — I444 Left anterior fascicular block: Secondary | ICD-10-CM | POA: Diagnosis present

## 2022-09-17 DIAGNOSIS — I5030 Unspecified diastolic (congestive) heart failure: Secondary | ICD-10-CM | POA: Insufficient documentation

## 2022-09-17 DIAGNOSIS — R42 Dizziness and giddiness: Secondary | ICD-10-CM | POA: Diagnosis not present

## 2022-09-17 DIAGNOSIS — Z7984 Long term (current) use of oral hypoglycemic drugs: Secondary | ICD-10-CM

## 2022-09-17 DIAGNOSIS — E1122 Type 2 diabetes mellitus with diabetic chronic kidney disease: Secondary | ICD-10-CM | POA: Diagnosis present

## 2022-09-17 DIAGNOSIS — Z1152 Encounter for screening for COVID-19: Secondary | ICD-10-CM | POA: Diagnosis not present

## 2022-09-17 DIAGNOSIS — R112 Nausea with vomiting, unspecified: Secondary | ICD-10-CM | POA: Diagnosis not present

## 2022-09-17 DIAGNOSIS — I480 Paroxysmal atrial fibrillation: Secondary | ICD-10-CM | POA: Diagnosis not present

## 2022-09-17 DIAGNOSIS — J449 Chronic obstructive pulmonary disease, unspecified: Secondary | ICD-10-CM | POA: Diagnosis present

## 2022-09-17 DIAGNOSIS — E860 Dehydration: Secondary | ICD-10-CM | POA: Diagnosis present

## 2022-09-17 DIAGNOSIS — Z83438 Family history of other disorder of lipoprotein metabolism and other lipidemia: Secondary | ICD-10-CM

## 2022-09-17 DIAGNOSIS — N289 Disorder of kidney and ureter, unspecified: Secondary | ICD-10-CM | POA: Diagnosis not present

## 2022-09-17 DIAGNOSIS — Z8249 Family history of ischemic heart disease and other diseases of the circulatory system: Secondary | ICD-10-CM

## 2022-09-17 DIAGNOSIS — Z7901 Long term (current) use of anticoagulants: Secondary | ICD-10-CM

## 2022-09-17 DIAGNOSIS — Z955 Presence of coronary angioplasty implant and graft: Secondary | ICD-10-CM

## 2022-09-17 DIAGNOSIS — T502X5A Adverse effect of carbonic-anhydrase inhibitors, benzothiadiazides and other diuretics, initial encounter: Secondary | ICD-10-CM | POA: Diagnosis not present

## 2022-09-17 DIAGNOSIS — I2510 Atherosclerotic heart disease of native coronary artery without angina pectoris: Secondary | ICD-10-CM | POA: Diagnosis present

## 2022-09-17 DIAGNOSIS — Z7902 Long term (current) use of antithrombotics/antiplatelets: Secondary | ICD-10-CM

## 2022-09-17 DIAGNOSIS — N189 Chronic kidney disease, unspecified: Secondary | ICD-10-CM | POA: Diagnosis not present

## 2022-09-17 DIAGNOSIS — E785 Hyperlipidemia, unspecified: Secondary | ICD-10-CM | POA: Diagnosis not present

## 2022-09-17 DIAGNOSIS — N1832 Chronic kidney disease, stage 3b: Secondary | ICD-10-CM | POA: Diagnosis present

## 2022-09-17 DIAGNOSIS — R000 Tachycardia, unspecified: Secondary | ICD-10-CM | POA: Diagnosis not present

## 2022-09-17 DIAGNOSIS — Z87891 Personal history of nicotine dependence: Secondary | ICD-10-CM

## 2022-09-17 DIAGNOSIS — I5A Non-ischemic myocardial injury (non-traumatic): Secondary | ICD-10-CM | POA: Diagnosis present

## 2022-09-17 DIAGNOSIS — K219 Gastro-esophageal reflux disease without esophagitis: Secondary | ICD-10-CM | POA: Diagnosis present

## 2022-09-17 DIAGNOSIS — N179 Acute kidney failure, unspecified: Secondary | ICD-10-CM | POA: Diagnosis not present

## 2022-09-17 DIAGNOSIS — D72829 Elevated white blood cell count, unspecified: Secondary | ICD-10-CM | POA: Diagnosis present

## 2022-09-17 DIAGNOSIS — N281 Cyst of kidney, acquired: Secondary | ICD-10-CM | POA: Diagnosis not present

## 2022-09-17 DIAGNOSIS — Z823 Family history of stroke: Secondary | ICD-10-CM

## 2022-09-17 DIAGNOSIS — D751 Secondary polycythemia: Secondary | ICD-10-CM | POA: Diagnosis present

## 2022-09-17 DIAGNOSIS — R0902 Hypoxemia: Secondary | ICD-10-CM | POA: Diagnosis not present

## 2022-09-17 DIAGNOSIS — Z885 Allergy status to narcotic agent status: Secondary | ICD-10-CM

## 2022-09-17 DIAGNOSIS — I130 Hypertensive heart and chronic kidney disease with heart failure and stage 1 through stage 4 chronic kidney disease, or unspecified chronic kidney disease: Secondary | ICD-10-CM | POA: Diagnosis present

## 2022-09-17 DIAGNOSIS — R197 Diarrhea, unspecified: Secondary | ICD-10-CM | POA: Diagnosis present

## 2022-09-17 DIAGNOSIS — Z79899 Other long term (current) drug therapy: Secondary | ICD-10-CM

## 2022-09-17 DIAGNOSIS — Z7401 Bed confinement status: Secondary | ICD-10-CM | POA: Diagnosis not present

## 2022-09-17 DIAGNOSIS — Z6827 Body mass index (BMI) 27.0-27.9, adult: Secondary | ICD-10-CM

## 2022-09-17 DIAGNOSIS — G9341 Metabolic encephalopathy: Secondary | ICD-10-CM | POA: Diagnosis present

## 2022-09-17 DIAGNOSIS — Z888 Allergy status to other drugs, medicaments and biological substances status: Secondary | ICD-10-CM

## 2022-09-17 DIAGNOSIS — E1169 Type 2 diabetes mellitus with other specified complication: Secondary | ICD-10-CM | POA: Diagnosis not present

## 2022-09-17 DIAGNOSIS — Z79890 Hormone replacement therapy (postmenopausal): Secondary | ICD-10-CM

## 2022-09-17 DIAGNOSIS — E876 Hypokalemia: Principal | ICD-10-CM | POA: Diagnosis present

## 2022-09-17 DIAGNOSIS — F03A0 Unspecified dementia, mild, without behavioral disturbance, psychotic disturbance, mood disturbance, and anxiety: Secondary | ICD-10-CM | POA: Diagnosis present

## 2022-09-17 DIAGNOSIS — R531 Weakness: Secondary | ICD-10-CM | POA: Diagnosis not present

## 2022-09-17 DIAGNOSIS — Z825 Family history of asthma and other chronic lower respiratory diseases: Secondary | ICD-10-CM

## 2022-09-17 DIAGNOSIS — Z83719 Family history of colon polyps, unspecified: Secondary | ICD-10-CM

## 2022-09-17 DIAGNOSIS — I4891 Unspecified atrial fibrillation: Secondary | ICD-10-CM | POA: Diagnosis present

## 2022-09-17 DIAGNOSIS — E663 Overweight: Secondary | ICD-10-CM | POA: Diagnosis present

## 2022-09-17 DIAGNOSIS — R079 Chest pain, unspecified: Secondary | ICD-10-CM | POA: Diagnosis not present

## 2022-09-17 DIAGNOSIS — E039 Hypothyroidism, unspecified: Secondary | ICD-10-CM | POA: Diagnosis not present

## 2022-09-17 DIAGNOSIS — R001 Bradycardia, unspecified: Secondary | ICD-10-CM | POA: Diagnosis not present

## 2022-09-17 DIAGNOSIS — Z833 Family history of diabetes mellitus: Secondary | ICD-10-CM

## 2022-09-17 NOTE — Progress Notes (Signed)
Paramedicine Encounter    Patient ID: Albert Powell, male    DOB: [birth year], 82 y.o.   MRN: [id number]   BP (!) 140/100 (BP Location: Right Leg, Patient Position: Sitting)   Pulse 90   Resp 18   SpO2 100%  Weight yesterday-not taken Last visit weight-199lb  Arrived for weekly visit and med rec.  Found Albert Powell to be actively vomiting which he said started yesterday.  He has been trying to take ODT Zofran but just keeps throwing up.  Pt skin cool to touch.  He denies chest pain or SOB.  Lung sounds clear and no edema noted.  Pt still having poor med compliance.  I recommended he go to the hospital for further evaluation and he agreed to be transported by EMS to [facility].  I stayed and completed med box and advised I would follow his status in the hospital so that I can accommodate any med changes when he is discharged.  Home visit complete.    Beatrix Shipper, EMT-Paramedic [phone] 09/19/2022  Patient Care Team: Patient, No Pcp Per as PCP - General (General Practice) [name], MD as PCP - Cardiology (Cardiology) [name], MD as Consulting Physician (Cardiology) Almetta Lovely, NP as [facility] (Geriatric Medicine)  Patient Active Problem List   Diagnosis Date Noted   AKI (acute kidney injury) ([name]) 09/06/2022   Chronic combined systolic and diastolic CHF (congestive heart failure) ([name]) 09/04/2022   Near syncope 09/04/2022   Hypokalemia 06/23/2022   Elevated troponin 06/23/2022   Type 2 diabetes mellitus with hyperlipidemia ([name]) 05/19/2022   Acute combined systolic and diastolic heart failure ([name])    Acute kidney injury superimposed on chronic kidney disease ([name]) 05/13/2022   GERD (gastroesophageal reflux disease) 05/13/2022   Acquired hypothyroidism 05/13/2022   Paroxysmal atrial fibrillation ([name]) 10/08/2021   Acute metabolic encephalopathy    CHF (congestive heart failure) ([name]) 03/10/2021   Essential hypertension  03/10/2021   Hypertensive crisis 01/01/2021   ACS (acute coronary syndrome) ([name])    Lesion of right native kidney    Atrial fibrillation ([name]) 03/30/2020   Contusion of right hand 11/12/2018   Primary osteoarthritis of both first carpometacarpal joints 11/12/2018   Pain in joint of right shoulder 10/05/2018   Pain in right hand 10/05/2018   Hematoma 03/30/2018   Degeneration of lumbar intervertebral disc 12/21/2017   Lumbar radiculopathy 12/21/2017   Vertigo 04/09/2017   Hypogonadism in male 12/07/2014   Screening for prostate cancer 12/07/2014   Claudication of right lower extremity ([name]) 10/20/2013   Claudication in peripheral vascular disease- Rt leg 09/20/2013   Obesity (BMI 30-39.9)- negative sleep study in the past 09/20/2013   Occlusion and stenosis of carotid artery without mention of cerebral infarction 05/23/2013   PAD (peripheral artery disease) ([name]) 05/18/2013   Carotid artery disease ([name]) 04/14/2013   Bradycardia, sinus 02/18/2013   Tobacco abuse 02/05/2013   Chest pain, unstable angina, negative MI 02/04/2013   Coronary artery disease involving native coronary artery of native heart without angina pectoris 02/04/2013   Benign essential hypertension 07/10/2011   Incisional hernia 05/22/2011    Current Outpatient Medications:    amiodarone (PACERONE) 200 MG tablet, Take 1 tablet (200 mg total) by mouth 2 (two) times daily., Disp: 60 tablet, Rfl: 3   amLODipine (NORVASC) 10 MG tablet, Take 1 tablet (10 mg total) by mouth daily., Disp: 30 tablet, Rfl: 11   apixaban (ELIQUIS) 2.5 MG TABS tablet, Take 1  tablet (2.5 mg total) by mouth 2 (two) times daily., Disp: 60 tablet, Rfl: 3   atorvastatin (LIPITOR) 40 MG tablet, Take 1 tablet (40 mg total) by mouth daily., Disp: 30 tablet, Rfl: 11   clopidogrel (PLAVIX) 75 MG tablet, Take 1 tablet (75 mg total) by mouth daily., Disp: 30 tablet, Rfl: 11   glipiZIDE (GLUCOTROL) 5 MG tablet, Take 5 mg by mouth daily before breakfast.,  Disp: , Rfl:    hydrALAZINE (APRESOLINE) 100 MG tablet, Take 1 tablet (100 mg total) by mouth 3 (three) times daily., Disp: 90 tablet, Rfl: 0   isosorbide mononitrate (IMDUR) 120 MG 24 hr tablet, Take 1 tablet (120 mg total) by mouth daily., Disp: 30 tablet, Rfl: 11   levothyroxine (SYNTHROID) 25 MCG tablet, TAKE ONE TABLET BY MOUTH DAILY BEFORE BREAKFAST (Patient taking differently: Take 75 mcg by mouth daily before breakfast.), Disp: 90 tablet, Rfl: 3   ondansetron (ZOFRAN-ODT) 4 MG disintegrating tablet, Take 1 tablet (4 mg total) by mouth every 8 (eight) hours as needed for up to 15 doses for nausea or vomiting., Disp: 15 tablet, Rfl: 0   pantoprazole (PROTONIX) 40 MG tablet, Take 1 tablet (40 mg total) by mouth every other day., Disp: 30 tablet, Rfl: 3   torsemide (DEMADEX) 20 MG tablet, Take 1 tablet (20 mg total) by mouth daily., Disp: 120 tablet, Rfl: 4 Allergies  Allergen Reactions   Fentanyl Other (See Comments)    Behavioral changes   Gabapentin Swelling   Lisinopril Other (See Comments)    Unknown reaction   Lyrica [Pregabalin] Other (See Comments)    Unknown reaction   Metformin Diarrhea   Propofol Other (See Comments)    Heart rate dropped      Social History   Socioeconomic History   Marital status: Married    Spouse name: Albert Powell   Number of children: 1   Years of education: Not on file   Highest education level: High school graduate  Occupational History   Occupation: retired  Tobacco Use   Smoking status: Former    Packs/day: 1.50    Years: 57.00    Total pack years: 85.50    Types: Cigarettes, E-cigarettes    Quit date: 2020    Years since quitting: 4.0   Smokeless tobacco: Never   Tobacco comments:    pt states that he is using the vapor cigs--12/2021  Vaping Use   Vaping Use: Former   Start date: 09/15/2016   Substances: Nicotine  Substance and Sexual Activity   Alcohol use: Yes    Alcohol/week: 1.0 standard drink of alcohol    Types: 1  Glasses of wine per week    Comment: "very little"   Drug use: No   Sexual activity: Not on file  Other Topics Concern   Not on file  Social History Narrative   Not on file   Social Determinants of Health   Financial Resource Strain: Low Risk  (06/03/2022)   Overall Financial Resource Strain (CARDIA)    Difficulty of Paying Living Expenses: Not hard at all  Food Insecurity: No Food Insecurity (08/14/2022)   Hunger Vital Sign    Worried About Running Out of Food in the Last Year: Never true    Ran Out of Food in the Last Year: Never true  Transportation Needs: Unmet Transportation Needs (08/21/2022)   PRAPARE - Hydrologist (Medical): Yes    Lack of Transportation (Non-Medical): No  Physical Activity: Not  on file  Stress: Not on file  Social Connections: Not on file  Intimate Partner Violence: Not At Risk (08/14/2022)   Humiliation, Afraid, Rape, and Kick questionnaire    Fear of Current or Ex-Partner: No    Emotionally Abused: No    Physically Abused: No    Sexually Abused: No    Physical Exam      Future Appointments  Date Time Provider [place]  09/25/2022 10:30 AM [place], [facility] Paramedic  09/17/22

## 2022-09-17 NOTE — ED Notes (Signed)
X2 failed attempt to collect labs and RN was not successful

## 2022-09-17 NOTE — ED Triage Notes (Signed)
Patient BIB [facility] from home for evaluation of nausea, vomiting, and dizziness that started three days ago. History of vertigo. Patient is alert and oriented at this time.

## 2022-09-17 NOTE — ED Provider Triage Note (Signed)
Emergency Medicine Provider Triage Evaluation Note  Albert Powell , a 82 y.o. male  was evaluated in triage.  Pt complains of emesis for couple days.  Patient denies any cough, congestion, headaches, chest pain, abdominal pain, diarrhea.  No known sick contacts.  History of type II diabetes and reports that he is taking his medications as prescribed.  Tried Zofran at home without much improvement.  Review of Systems  Positive: As above Negative: As above  Physical Exam  BP (!) 155/95 (BP Location: Left Arm)   Pulse 86   Temp (!) 97.5 F (36.4 C)   Resp 18   SpO2 96%  Gen:   Awake, no distress   Resp:  Normal effort  MSK:   Moves extremities without difficulty  Other:  Normal bowel sounds  Medical Decision Making  Medically screening exam initiated at 2:17 PM.  Appropriate orders placed.  Albert Powell was informed that the remainder of the evaluation will be completed by another provider, this initial triage assessment does not replace that evaluation, and the importance of remaining in the ED until their evaluation is complete.     Luvenia Heller, PA-C 09/17/22 1418

## 2022-09-18 ENCOUNTER — Other Ambulatory Visit: Payer: Self-pay

## 2022-09-18 ENCOUNTER — Encounter (HOSPITAL_COMMUNITY): Payer: Self-pay | Admitting: Internal Medicine

## 2022-09-18 ENCOUNTER — Emergency Department (HOSPITAL_COMMUNITY): Payer: Medicare Other

## 2022-09-18 DIAGNOSIS — E785 Hyperlipidemia, unspecified: Secondary | ICD-10-CM

## 2022-09-18 DIAGNOSIS — Z1152 Encounter for screening for COVID-19: Secondary | ICD-10-CM | POA: Diagnosis not present

## 2022-09-18 DIAGNOSIS — N289 Disorder of kidney and ureter, unspecified: Secondary | ICD-10-CM | POA: Diagnosis not present

## 2022-09-18 DIAGNOSIS — E1151 Type 2 diabetes mellitus with diabetic peripheral angiopathy without gangrene: Secondary | ICD-10-CM | POA: Diagnosis present

## 2022-09-18 DIAGNOSIS — I5032 Chronic diastolic (congestive) heart failure: Secondary | ICD-10-CM | POA: Diagnosis present

## 2022-09-18 DIAGNOSIS — I130 Hypertensive heart and chronic kidney disease with heart failure and stage 1 through stage 4 chronic kidney disease, or unspecified chronic kidney disease: Secondary | ICD-10-CM | POA: Diagnosis present

## 2022-09-18 DIAGNOSIS — G9341 Metabolic encephalopathy: Secondary | ICD-10-CM | POA: Diagnosis present

## 2022-09-18 DIAGNOSIS — N189 Chronic kidney disease, unspecified: Secondary | ICD-10-CM | POA: Diagnosis not present

## 2022-09-18 DIAGNOSIS — E871 Hypo-osmolality and hyponatremia: Secondary | ICD-10-CM | POA: Diagnosis present

## 2022-09-18 DIAGNOSIS — N281 Cyst of kidney, acquired: Secondary | ICD-10-CM | POA: Diagnosis not present

## 2022-09-18 DIAGNOSIS — I10 Essential (primary) hypertension: Secondary | ICD-10-CM

## 2022-09-18 DIAGNOSIS — E039 Hypothyroidism, unspecified: Secondary | ICD-10-CM

## 2022-09-18 DIAGNOSIS — I480 Paroxysmal atrial fibrillation: Secondary | ICD-10-CM

## 2022-09-18 DIAGNOSIS — D751 Secondary polycythemia: Secondary | ICD-10-CM | POA: Diagnosis present

## 2022-09-18 DIAGNOSIS — E1169 Type 2 diabetes mellitus with other specified complication: Secondary | ICD-10-CM | POA: Diagnosis present

## 2022-09-18 DIAGNOSIS — F03A0 Unspecified dementia, mild, without behavioral disturbance, psychotic disturbance, mood disturbance, and anxiety: Secondary | ICD-10-CM | POA: Diagnosis present

## 2022-09-18 DIAGNOSIS — N179 Acute kidney failure, unspecified: Principal | ICD-10-CM

## 2022-09-18 DIAGNOSIS — R112 Nausea with vomiting, unspecified: Secondary | ICD-10-CM

## 2022-09-18 DIAGNOSIS — E876 Hypokalemia: Secondary | ICD-10-CM | POA: Diagnosis present

## 2022-09-18 DIAGNOSIS — I5030 Unspecified diastolic (congestive) heart failure: Secondary | ICD-10-CM | POA: Insufficient documentation

## 2022-09-18 DIAGNOSIS — R531 Weakness: Secondary | ICD-10-CM | POA: Diagnosis not present

## 2022-09-18 DIAGNOSIS — N1832 Chronic kidney disease, stage 3b: Secondary | ICD-10-CM | POA: Diagnosis present

## 2022-09-18 DIAGNOSIS — E860 Dehydration: Secondary | ICD-10-CM | POA: Diagnosis present

## 2022-09-18 DIAGNOSIS — I5A Non-ischemic myocardial injury (non-traumatic): Secondary | ICD-10-CM | POA: Diagnosis present

## 2022-09-18 DIAGNOSIS — J449 Chronic obstructive pulmonary disease, unspecified: Secondary | ICD-10-CM | POA: Diagnosis present

## 2022-09-18 DIAGNOSIS — Y92239 Unspecified place in hospital as the place of occurrence of the external cause: Secondary | ICD-10-CM | POA: Diagnosis not present

## 2022-09-18 DIAGNOSIS — E1122 Type 2 diabetes mellitus with diabetic chronic kidney disease: Secondary | ICD-10-CM | POA: Diagnosis present

## 2022-09-18 DIAGNOSIS — R197 Diarrhea, unspecified: Secondary | ICD-10-CM | POA: Diagnosis present

## 2022-09-18 DIAGNOSIS — A09 Infectious gastroenteritis and colitis, unspecified: Secondary | ICD-10-CM | POA: Diagnosis present

## 2022-09-18 DIAGNOSIS — Z7401 Bed confinement status: Secondary | ICD-10-CM | POA: Diagnosis not present

## 2022-09-18 DIAGNOSIS — D72829 Elevated white blood cell count, unspecified: Secondary | ICD-10-CM | POA: Diagnosis present

## 2022-09-18 DIAGNOSIS — Z87891 Personal history of nicotine dependence: Secondary | ICD-10-CM | POA: Diagnosis not present

## 2022-09-18 LAB — URINALYSIS, ROUTINE W REFLEX MICROSCOPIC
Bacteria, UA: NONE SEEN
Bilirubin Urine: NEGATIVE
Glucose, UA: 150 mg/dL — AB
Ketones, ur: NEGATIVE mg/dL
Leukocytes,Ua: NEGATIVE
Nitrite: NEGATIVE
Protein, ur: >=300 mg/dL — AB
Specific Gravity, Urine: 1.007 (ref 1.005–1.030)
pH: 6 (ref 5.0–8.0)

## 2022-09-18 LAB — GLUCOSE, CAPILLARY
Glucose-Capillary: 200 mg/dL — ABNORMAL HIGH (ref 70–99)
Glucose-Capillary: 189 mg/dL — ABNORMAL HIGH (ref 70–99)

## 2022-09-18 LAB — BASIC METABOLIC PANEL
Anion gap: 26 — ABNORMAL HIGH (ref 5–15)
BUN: 22 mg/dL (ref 8–23)
CO2: 18 mmol/L — ABNORMAL LOW (ref 22–32)
Calcium: 9.8 mg/dL (ref 8.9–10.3)
Chloride: 85 mmol/L — ABNORMAL LOW (ref 98–111)
Creatinine, Ser: 3.18 mg/dL — ABNORMAL HIGH (ref 0.61–1.24)
GFR, Estimated: 19 mL/min — ABNORMAL LOW (ref >60)
Glucose, Bld: 235 mg/dL — ABNORMAL HIGH (ref 70–99)
Potassium: 2.5 mmol/L — CL (ref 3.5–5.1)
Sodium: 129 mmol/L — ABNORMAL LOW (ref 135–145)

## 2022-09-18 LAB — RESP PANEL BY RT-PCR (RSV, FLU A&B, COVID)  RVPGX2
Influenza A by PCR: NEGATIVE
Influenza B by PCR: NEGATIVE
Resp Syncytial Virus by PCR: NEGATIVE
SARS Coronavirus 2 by RT PCR: NEGATIVE

## 2022-09-18 LAB — CBC
HCT: 53 % — ABNORMAL HIGH (ref 39.0–52.0)
Hemoglobin: 18.9 g/dL — ABNORMAL HIGH (ref 13.0–17.0)
MCH: 30.5 pg (ref 26.0–34.0)
MCHC: 35.7 g/dL (ref 30.0–36.0)
MCV: 85.6 fL (ref 80.0–100.0)
Platelets: 364 10*3/uL (ref 150–400)
RBC: 6.19 MIL/uL — ABNORMAL HIGH (ref 4.22–5.81)
RDW: 14.5 % (ref 11.5–15.5)
WBC: 16.1 10*3/uL — ABNORMAL HIGH (ref 4.0–10.5)
nRBC: 0 % (ref 0.0–0.2)

## 2022-09-18 LAB — TROPONIN I (HIGH SENSITIVITY): Troponin I (High Sensitivity): 71 ng/L — ABNORMAL HIGH (ref <18)

## 2022-09-18 LAB — CBG MONITORING, ED: Glucose-Capillary: 253 mg/dL — ABNORMAL HIGH (ref 70–99)

## 2022-09-18 MED ORDER — ONDANSETRON HCL 4 MG/2ML IJ SOLN
4.0000 mg | Freq: Once | INTRAMUSCULAR | Status: AC
  Administered 2022-09-18: 4 mg via INTRAVENOUS
  Filled 2022-09-18: qty 2

## 2022-09-18 MED ORDER — HYDRALAZINE HCL 100 MG PO TABS: 100.0000 mg | ORAL_TABLET | Freq: Three times a day (TID) | ORAL | Status: DC

## 2022-09-18 MED ORDER — TORSEMIDE 20 MG PO TABS
20.0000 mg | ORAL_TABLET | Freq: Every day | ORAL | Status: DC
  Administered 2022-09-18 – 2022-09-20 (×3): 20 mg via ORAL
  Filled 2022-09-18 (×3): qty 1

## 2022-09-18 MED ORDER — HYDRALAZINE HCL 50 MG PO TABS
100.0000 mg | ORAL_TABLET | Freq: Three times a day (TID) | ORAL | Status: DC
  Administered 2022-09-18 – 2022-09-25 (×21): 100 mg via ORAL
  Filled 2022-09-18 (×3): qty 2
  Filled 2022-09-18: qty 4
  Filled 2022-09-18 (×19): qty 2

## 2022-09-18 MED ORDER — POTASSIUM CHLORIDE CRYS ER 20 MEQ PO TBCR
40.0000 meq | EXTENDED_RELEASE_TABLET | ORAL | Status: AC
  Administered 2022-09-18: 40 meq via ORAL
  Filled 2022-09-18: qty 2

## 2022-09-18 MED ORDER — LEVOTHYROXINE SODIUM 75 MCG PO TABS
75.0000 ug | ORAL_TABLET | Freq: Every day | ORAL | Status: DC
  Administered 2022-09-19 – 2022-09-25 (×7): 75 ug via ORAL
  Filled 2022-09-18 (×7): qty 1

## 2022-09-18 MED ORDER — AMLODIPINE BESYLATE 10 MG PO TABS
10.0000 mg | ORAL_TABLET | Freq: Every day | ORAL | Status: DC
  Administered 2022-09-18 – 2022-09-25 (×8): 10 mg via ORAL
  Filled 2022-09-18: qty 2
  Filled 2022-09-18 (×7): qty 1

## 2022-09-18 MED ORDER — GLIPIZIDE 5 MG PO TABS: 5.0000 mg | ORAL_TABLET | Freq: Every day | ORAL | Status: DC

## 2022-09-18 MED ORDER — INSULIN ASPART 100 UNIT/ML IJ SOLN
0.0000 [IU] | Freq: Three times a day (TID) | INTRAMUSCULAR | Status: DC
  Administered 2022-09-19 – 2022-09-20 (×4): 2 [IU] via SUBCUTANEOUS
  Administered 2022-09-20: 3 [IU] via SUBCUTANEOUS
  Administered 2022-09-20: 5 [IU] via SUBCUTANEOUS
  Administered 2022-09-21: 2 [IU] via SUBCUTANEOUS
  Administered 2022-09-21: 1 [IU] via SUBCUTANEOUS
  Administered 2022-09-21 – 2022-09-22 (×2): 2 [IU] via SUBCUTANEOUS
  Administered 2022-09-22: 1 [IU] via SUBCUTANEOUS
  Administered 2022-09-22: 3 [IU] via SUBCUTANEOUS
  Administered 2022-09-23: 2 [IU] via SUBCUTANEOUS
  Administered 2022-09-23: 1 [IU] via SUBCUTANEOUS
  Administered 2022-09-23 – 2022-09-24 (×3): 2 [IU] via SUBCUTANEOUS
  Administered 2022-09-24 – 2022-09-25 (×2): 1 [IU] via SUBCUTANEOUS
  Administered 2022-09-25: 3 [IU] via SUBCUTANEOUS

## 2022-09-18 MED ORDER — POTASSIUM CHLORIDE 10 MEQ/100ML IV SOLN
10.0000 meq | INTRAVENOUS | Status: DC
  Administered 2022-09-18: 10 meq via INTRAVENOUS
  Filled 2022-09-18: qty 100

## 2022-09-18 MED ORDER — AMIODARONE HCL 200 MG PO TABS
200.0000 mg | ORAL_TABLET | Freq: Two times a day (BID) | ORAL | Status: DC
  Administered 2022-09-18 – 2022-09-25 (×15): 200 mg via ORAL
  Filled 2022-09-18 (×15): qty 1

## 2022-09-18 MED ORDER — CLOPIDOGREL BISULFATE 75 MG PO TABS
75.0000 mg | ORAL_TABLET | Freq: Every day | ORAL | Status: DC
  Administered 2022-09-19 – 2022-09-25 (×7): 75 mg via ORAL
  Filled 2022-09-18 (×7): qty 1

## 2022-09-18 MED ORDER — ISOSORBIDE MONONITRATE ER 60 MG PO TB24
120.0000 mg | ORAL_TABLET | Freq: Every day | ORAL | Status: DC
  Administered 2022-09-18 – 2022-09-25 (×8): 120 mg via ORAL
  Filled 2022-09-18: qty 2
  Filled 2022-09-18: qty 4
  Filled 2022-09-18 (×6): qty 2

## 2022-09-18 MED ORDER — TRIMETHOBENZAMIDE HCL 100 MG/ML IM SOLN
200.0000 mg | Freq: Four times a day (QID) | INTRAMUSCULAR | Status: DC | PRN
  Administered 2022-09-20: 200 mg via INTRAMUSCULAR
  Filled 2022-09-18 (×2): qty 2

## 2022-09-18 MED ORDER — SODIUM CHLORIDE 0.9 % IV SOLN: INTRAVENOUS | Status: DC

## 2022-09-18 MED ORDER — ATORVASTATIN CALCIUM 40 MG PO TABS
40.0000 mg | ORAL_TABLET | Freq: Every day | ORAL | Status: DC
  Administered 2022-09-18 – 2022-09-25 (×8): 40 mg via ORAL
  Filled 2022-09-18 (×8): qty 1

## 2022-09-18 MED ORDER — POTASSIUM CHLORIDE 10 MEQ/100ML IV SOLN
10.0000 meq | Freq: Once | INTRAVENOUS | Status: AC
  Administered 2022-09-18: 10 meq via INTRAVENOUS
  Filled 2022-09-18: qty 100

## 2022-09-18 MED ORDER — LEVOTHYROXINE SODIUM 75 MCG PO TABS: 75.0000 ug | ORAL_TABLET | Freq: Every day | ORAL | Status: DC

## 2022-09-18 MED ORDER — SODIUM CHLORIDE 0.9 % IV BOLUS
1000.0000 mL | Freq: Once | INTRAVENOUS | Status: AC
  Administered 2022-09-18: 1000 mL via INTRAVENOUS

## 2022-09-18 MED ORDER — INSULIN ASPART 100 UNIT/ML IJ SOLN
0.0000 [IU] | Freq: Every day | INTRAMUSCULAR | Status: DC
  Administered 2022-09-24: 2 [IU] via SUBCUTANEOUS

## 2022-09-18 MED ORDER — APIXABAN 2.5 MG PO TABS
2.5000 mg | ORAL_TABLET | Freq: Two times a day (BID) | ORAL | Status: DC
  Administered 2022-09-18 – 2022-09-25 (×15): 2.5 mg via ORAL
  Filled 2022-09-18 (×15): qty 1

## 2022-09-18 NOTE — Progress Notes (Signed)
New Admission Note:   Arrival Method: Stretcher  Mental Orientation: Alert and oriented  Telemetry: Assessment: Completed Skin: intact  has a pain stimulator right buttocks  QI:HKVQ ac  Pain: 0/10  Tubes: none  Safety Measures: Safety Fall Prevention Plan has been given, discussed and signed Admission: Completed [facility] Orientation: Patient has been orientated to the room, unit and staff.  Family: none   Orders have been reviewed and implemented. Will continue to monitor the patient. Call light has been placed within reach and bed alarm has been activated.   Esaul Dorwart RN [facility] Phone: [phone]

## 2022-09-18 NOTE — ED Provider Notes (Signed)
[name] EMERGENCY DEPARTMENT Provider Note   CSN: [id number] Arrival date & time: 09/17/22  1308     History  Chief Complaint  Patient presents with   Emesis    Albert Powell is a 82 y.o. male with a history of paroxysmal A-fib on Eliquis, type 2 diabetes, coronary disease, chronic kidney disease, hyperlipidemia, congestive heart failure with an EF of 25 to 30%, presenting to the emergency department with concern for nausea and vomiting and feeling unwell for several days.  The patient denies diarrhea or constipation problems but cannot recall his last bowel movement.  He says he feels dehydrated.  He presented initially to the ED yesterday afternoon and due to prolonged waiting time was not seen by myself in a bed until this morning.  He has not had his evening medications.  HPI     Home Medications Prior to Admission medications   Medication Sig Start Date End Date Taking? Authorizing Provider  amiodarone (PACERONE) 200 MG tablet Take 1 tablet (200 mg total) by mouth 2 (two) times daily. 08/05/22  Yes Laurey Morale, MD  amLODipine (NORVASC) 10 MG tablet Take 1 tablet (10 mg total) by mouth daily. 08/05/22 08/05/23 Yes Laurey Morale, MD  apixaban (ELIQUIS) 2.5 MG TABS tablet Take 1 tablet (2.5 mg total) by mouth 2 (two) times daily. 08/05/22  Yes Laurey Morale, MD  atorvastatin (LIPITOR) 40 MG tablet Take 1 tablet (40 mg total) by mouth daily. 08/15/22  Yes Laurey Morale, MD  clopidogrel (PLAVIX) 75 MG tablet Take 1 tablet (75 mg total) by mouth daily. 08/15/22  Yes Laurey Morale, MD  glipiZIDE (GLUCOTROL) 5 MG tablet Take 5 mg by mouth daily before breakfast.   Yes [provider]  isosorbide mononitrate (IMDUR) 120 MG 24 hr tablet Take 1 tablet (120 mg total) by mouth daily. 08/15/22  Yes Laurey Morale, MD  levothyroxine (SYNTHROID) 25 MCG tablet TAKE ONE TABLET BY MOUTH DAILY BEFORE BREAKFAST Patient taking differently: Take 75 mcg  by mouth daily before breakfast. 03/13/22  Yes [name], MD  ondansetron (ZOFRAN-ODT) 4 MG disintegrating tablet Take 1 tablet (4 mg total) by mouth every 8 (eight) hours as needed for up to 15 doses for nausea or vomiting. 08/28/22  Yes Maanya Hippert, Kermit Balo, MD  pantoprazole (PROTONIX) 40 MG tablet Take 1 tablet (40 mg total) by mouth every other day. 07/30/22  Yes Sharol Harness, Brittainy M, PA-C  torsemide (DEMADEX) 20 MG tablet Take 1 tablet (20 mg total) by mouth daily. 09/07/22  Yes Arrien, York Ram, MD  hydrALAZINE (APRESOLINE) 100 MG tablet Take 1 tablet (100 mg total) by mouth 3 (three) times daily. Patient not taking: Reported on 09/18/2022 08/05/22 09/05/22  Laurey Morale, MD      Allergies    Fentanyl, Gabapentin, Lisinopril, Lyrica [pregabalin], Metformin, and Propofol    Review of Systems   Review of Systems  Physical Exam Updated Vital Signs BP (!) 163/86   Pulse (!) 49   Temp 98.2 F (36.8 C)   Resp (!) 27   SpO2 99%  Physical Exam Constitutional:      General: He is not in acute distress. HENT:     Head: Normocephalic and atraumatic.  Eyes:     Conjunctiva/sclera: Conjunctivae normal.     Pupils: Pupils are equal, round, and reactive to light.  Cardiovascular:     Rate and Rhythm: Normal rate. Rhythm irregular.  Pulmonary:  Effort: Pulmonary effort is normal. No respiratory distress.  Abdominal:     General: There is no distension.     Tenderness: There is no abdominal tenderness.  Skin:    General: Skin is warm and dry.  Neurological:     General: No focal deficit present.     Mental Status: He is alert. Mental status is at baseline.  Psychiatric:        Mood and Affect: Mood normal.        Behavior: Behavior normal.     ED Results / Procedures / Treatments   Labs (all labs ordered are listed, but only abnormal results are displayed) Labs Reviewed  BASIC METABOLIC PANEL - Abnormal; Notable for the following components:      Result Value    Sodium 129 (*)    Potassium 2.5 (*)    Chloride 85 (*)    CO2 18 (*)    Glucose, Bld 235 (*)    Creatinine, Ser 3.18 (*)    GFR, Estimated 19 (*)    Anion gap 26 (*)    All other components within normal limits  CBC - Abnormal; Notable for the following components:   WBC 16.1 (*)    RBC 6.19 (*)    Hemoglobin 18.9 (*)    HCT 53.0 (*)    All other components within normal limits  URINALYSIS, ROUTINE W REFLEX MICROSCOPIC - Abnormal; Notable for the following components:   Glucose, UA 150 (*)    Hgb urine dipstick MODERATE (*)    Protein, ur >=300 (*)    All other components within normal limits  CBG MONITORING, ED - Abnormal; Notable for the following components:   Glucose-Capillary 253 (*)    All other components within normal limits  TROPONIN I (HIGH SENSITIVITY) - Abnormal; Notable for the following components:   Troponin I (High Sensitivity) 71 (*)    All other components within normal limits  RESP PANEL BY RT-PCR (RSV, FLU A&B, COVID)  [id number]    EKG EKG Interpretation  Date/Time:  Wednesday September 17 2022 14:09:17 EST Ventricular Rate:  98 PR Interval:  248 QRS Duration: 90 QT Interval:  426 QTC Calculation: 543 R Axis:   -86 Text Interpretation: Sinus rhythm with marked sinus arrhythmia with 1st degree A-V block Left anterior fascicular block Inferior infarct , age undetermined Anteroseptal infarct , age undetermined Prolonged QT Abnormal ECG When compared with ECG of 17-Sep-2022 13:49, PREVIOUS ECG IS PRESENT No significant changes Confirmed by Alvester Chou [phone]) on 09/18/2022 7:41:37 AM  Radiology CT ABDOMEN PELVIS WO CONTRAST  Result Date: 09/18/2022 CLINICAL DATA:  Nausea vomiting. Acute kidney injury. Bowel obstruction suspected. EXAM: CT ABDOMEN AND PELVIS WITHOUT CONTRAST TECHNIQUE: Multidetector CT imaging of the abdomen and pelvis was performed following the standard protocol without IV contrast. RADIATION DOSE REDUCTION: This exam was performed  according to the departmental dose-optimization program which includes automated exposure control, adjustment of the mA and/or kV according to patient size and/or use of iterative reconstruction technique. COMPARISON:  CT renal stone protocol 09/04/2022 FINDINGS: Lower chest: 2 adjacent elongated densities in the right lower lobe on image 9/5 that have minimally changed since the recent comparison examination. Largest density measures up to 4 mm and likely an incidental finding. No pleural effusions. Hepatobiliary: Normal appearance of the liver. High-density material in the gallbladder is suggestive for sludge. No significant gallbladder distension. No evidence for gallbladder inflammatory changes. Pancreas: Unremarkable. No pancreatic ductal dilatation or surrounding inflammatory changes. Spleen: Normal  in size without focal abnormality. Adrenals/Urinary Tract: Again noted is a low-density nodule in left adrenal gland that is suggestive for an adenoma based on the Hounsfield units measuring less than 0. Normal appearance of the right adrenal gland. Left kidney is atrophic with low-density cysts. Again noted is an exophytic hyperdense structure along the anterior aspect of the right kidney. This structure measures up to 1.2 cm on image 31/3 and minimally changed since the recent comparison examination. Hounsfield units measure approximately 51. Suspect this is a hyperdense or proteinaceous cyst. Additional low-density cysts in the right kidney. Negative for hydronephrosis. Normal appearance of the urinary bladder. No ureter stones. Calcifications involving both kidneys are most likely vascular. No definite urinary calculi. Stomach/Bowel: Small hiatal hernia. Normal appearance of the stomach. Again noted are loops of small bowel involving a right paracentral upper abdominal ventral hernia. There is no evidence for bowel dilatation or obstruction. No focal bowel inflammation. Evidence for colectomy with ileal-rectal  anastomosis. Moderate amount of stool in the rectum. No acute bowel inflammation. Vascular/Lymphatic: Abdominal aorta and iliac arteries are heavily calcified. No evidence for an aortic aneurysm. Stents involving the common iliac arteries bilaterally. Significant calcifications involving the right common femoral artery. No significant lymph node enlargement in the abdomen or pelvis. Reproductive: Prostate is stable and unremarkable. Other: Negative for free fluid. Evidence for postoperative changes along the anterior abdominal wall. Upper abdominal right paracentral ventral hernia containing small bowel. There is small left paracentral ventral hernia in the upper abdomen containing fat. Laxity along the right paracentral lower abdominal wall. Negative for free air. Musculoskeletal: Spinal stimulator with the generator in the right buttock. Spinal stimulator is incompletely imaged. Disc space narrowing at L4-L5 and L5-S1. No acute bone abnormality. IMPRESSION: 1. No acute abnormality in the abdomen or pelvis. 2. Right upper abdominal ventral hernia containing small bowel but no evidence for obstruction or acute inflammation. 3. Status post colectomy. 4. Atrophic left kidney. 5. Multiple renal cysts including a probable proteinaceous or hemorrhagic right renal cyst. These are likely benign renal cysts and not require dedicated follow-up. 6.  Aortic Atherosclerosis (ICD10-I70.0). Electronically Signed   By: Markus Daft M.D.   On: 09/18/2022 09:48   DG Chest 1 View  Result Date: 09/17/2022 CLINICAL DATA:  Chest pain. EXAM: CHEST  1 VIEW COMPARISON:  09/04/2022 FINDINGS: Heart size and mediastinal contours are unremarkable. Implanted device along the left heart border is unchanged from the previous exam. Dorsal column stimulator is noted with lead terminating in the midthoracic spine. No pleural effusion or edema. No airspace opacities identified. IMPRESSION: No active disease. Electronically Signed   By: Kerby Moors M.D.   On: 09/17/2022 14:53    Procedures Procedures    Medications Ordered in ED Medications  apixaban (ELIQUIS) tablet 2.5 mg (has no administration in time range)  amLODipine (NORVASC) tablet 10 mg (10 mg Oral Given 09/18/22 1200)  amiodarone (PACERONE) tablet 200 mg (200 mg Oral Given 09/18/22 1200)  atorvastatin (LIPITOR) tablet 40 mg (40 mg Oral Given 09/18/22 1200)  torsemide (DEMADEX) tablet 20 mg (20 mg Oral Given 09/18/22 1200)  isosorbide mononitrate (IMDUR) 24 hr tablet 120 mg (120 mg Oral Given 09/18/22 1200)  hydrALAZINE (APRESOLINE) tablet 100 mg (has no administration in time range)  levothyroxine (SYNTHROID) tablet 75 mcg (has no administration in time range)  trimethobenzamide (TIGAN) injection 200 mg (has no administration in time range)  potassium chloride SA (KLOR-CON M) CR tablet 40 mEq (has no administration in time range)  sodium chloride 0.9 % bolus 1,000 mL (0 mLs Intravenous Stopped 09/18/22 0919)  potassium chloride 10 mEq in 100 mL IVPB (0 mEq Intravenous Stopped 09/18/22 0920)  ondansetron (ZOFRAN) injection 4 mg (4 mg Intravenous Given 09/18/22 1191)    ED Course/ Medical Decision Making/ A&P Clinical Course as of 09/18/22 1326  Thu Sep 18, 2022  1107 Admitted to Dr Tamala Julian hospitalist [MT]    Clinical Course User Index [MT] Wyvonnia Dusky, MD                           Medical Decision Making Amount and/or Complexity of Data Reviewed Labs: ordered. Radiology: ordered.  Risk Prescription drug management. Decision regarding hospitalization.   This patient presents to the ED with concern for nausea, vomiting. This involves an extensive number of treatment options, and is a complaint that carries with it a high risk of complications and morbidity.  The differential diagnosis includes bowel obstruction versus viral illness versus other infection versus atypical ACS versus other  Co-morbidities that complicate the patient evaluation: CAD/ cardiac risk  factors, HLD, DM2, CAD   External records from outside source obtained and reviewed including hospital discharge summary Dec 2023 after patient found down at home, treated for AKI with IV fluids, concern for near syncope  I ordered and personally interpreted labs.  The pertinent results include: COVID and flu are negative.  BMP shows elevated anion gap, hypokalemia, AKI with creatinine 3.1.  White blood cell count also elevated at 16.1.  UA without sign of infection  I ordered imaging studies including dg chest, ct abdomen I independently visualized and interpreted imaging which showed no emergent findings or infectious findings explain the patient's nausea and vomiting I agree with the radiologist interpretation  The patient was maintained on a cardiac monitor.  I personally viewed and interpreted the cardiac monitored which showed an underlying rhythm of: A fib rate controlled  Per my interpretation the patient's ECG shows A fib rate controlled, prolonged Qtc, no acute ischemic findings  I ordered medication including IV fluids, IV zofran  I have reviewed the patients home medicines and have made adjustments as needed  Test Considered: No evidence of CVA/stroke/SAH - I did not feel emergent neuroimaging was indicated   After the interventions noted above, I reevaluated the patient and found that they have: stayed the same   Dispostion:  After consideration of the diagnostic results and the patients response to treatment, I feel that the patent would benefit from medical admission.  Although the patient does have a leukocytosis there is no clear source of infection identified on his workup to warrant antibiotics.  Low suspicion for sepsis at this time.         Final Clinical Impression(s) / ED Diagnoses Final diagnoses:  Hypokalemia  AKI (acute kidney injury) ([place])  Nausea and vomiting, unspecified vomiting type    Rx / DC Orders ED Discharge Orders     None          Wyvonnia Dusky, MD 09/18/22 1326

## 2022-09-18 NOTE — H&P (Signed)
History and Physical    Patient: Albert Powell:[id number] DOB: June 25, 1941 DOA: 09/17/2022 DOS: the patient was seen and examined on 09/18/2022 PCP: Patient, No Pcp Per  Patient coming from: Home( lives with wife) via  Chief Complaint:  Chief Complaint  Patient presents with   Emesis   HPI: Albert Powell is a 82 y.o. male with medical history significant of HTN, HLD, PAF on Eliquis, HFpEF, CAD s/p PCI, diabetes mellitus type 2, CKD stage 3b, hypothyroidism, and mild dementia who presents after having episode of vomiting.  The patient denies having any recent fever, chills, abdominal pain, dysuria, diarrhea, but has not been eating well.  Patient had just recently been hospitalized from 12/21-12/24/2023 after having a syncopal episode at home and was found down  by his wife.  He was found to have acute kidney injury and hydrated with improvement in symptoms.  Prior to the hospitalization he had been his normal self.  Over the phone his wife adds additional history and states that yesterday he had complained of nausea before having a single episode of vomiting up brownish material.  She reports that normally when patient is in the hospital he will call and tell her how he is doing, but this time he has not called which is unusual.  When the nurse tried to give him the phone he acted like he did not know how to use it and she stated that he did not sound like his self.  In the emergency department patient was noted be afebrile with tachycardia and tachypnea and blood pressure elevated up to 222/96, and O2 saturations.  Labs were significant for WBC 16.1, hemoglobin 18.9, sodium 129, potassium 2.5, chloride 85, CO2 18, glucose 235, BUN 22, creatinine 3.18, glucose 235, and troponin 71.  CT scan of the abdomen pelvis had noted no acute abnormality with a right upper abdominal ventral hernia containing small bowel without obstruction.  Influenza and COVID-19 screening were negative.  Given a total of  60 mEq of potassium chloride 1 L of normal saline IV fluids, and resumed on home medication regimen.  Review of Systems: As mentioned in the history of present illness. All other systems reviewed and are negative. Past Medical History:  Diagnosis Date   Atrial fibrillation ([place]) 03/30/2020   Bradycardia, sinus 02/18/2013   CAD (coronary artery disease)    stent to RCA 2003 also had 40% lesion at that time; NUCLEAR STRESS TEST, 01/16/2010 - normal  now with cath 80-90% stenosis in LAD, will try medical therapy if no improvement PCI   Carotid artery disease ([name])    Cataract    CHF (congestive heart failure) ([place])    Chronic kidney disease (CKD), stage III (moderate) ([place])    Claudication ([place])    LEA DUPLEX, 07/07/2008 - Normal   COPD (chronic obstructive pulmonary disease) ([name])    DDD (degenerative disc disease) 2008   Diabetes mellitus    GERD (gastroesophageal reflux disease)    H/O hiatal hernia    History of colonoscopy 2004   finding of tics and AMV only no polpys   Hyperlipidemia 02/05/2013   Hypertension    Hypothyroidism    Obesity    Onychomycosis    PAD (peripheral artery disease) ([name])    Rosacea    Tobacco abuse 02/05/2013   Past Surgical History:  Procedure Laterality Date   ABDOMINAL AORTOGRAM W/LOWER EXTREMITY N/A 03/30/2018   Procedure: ABDOMINAL AORTOGRAM W/LOWER EXTREMITY;  Surgeon: Serafina Mitchell, MD;  Location:  [facility];  Service: Cardiovascular;  Laterality: N/A;   ABDOMINAL AORTOGRAM W/LOWER EXTREMITY Bilateral 08/27/2020   Procedure: ABDOMINAL AORTOGRAM W/LOWER EXTREMITY;  Surgeon: [name], MD;  Location: [facility];  Service: Cardiovascular;  Laterality: Bilateral;   APPENDECTOMY     CARDIAC CATHETERIZATION  08/29/2002   2-vessel CAD with high-grade stenoses in RCA; stenting to RCA   CARDIAC CATHETERIZATION  2014   80-90% lesion will try medical therapy   CARDIAC SURGERY     stents  2003   CARDIOVERSION N/A 11/01/2013    Procedure: [place] COMPRESSION;  Surgeon: [name], MD;  Location: [facility] CATH LAB;  Service: Cardiovascular;  Laterality: N/A;   CAROTID ANGIOGRAM N/A 04/25/2013   Procedure: CAROTID ANGIOGRAM;  Surgeon: [name], MD;  Location: [facility] CATH LAB;  Service: Cardiovascular;  Laterality: N/A;   CAROTID ENDARTERECTOMY     COLON RESECTION  3/04, 6/04    1 bleeding diverticulitis, 2 complete colectomy   COLON SURGERY  2005   renal pouch rectal anastimosis   CORONARY ANGIOPLASTY WITH STENT PLACEMENT  09/2002   2 stents to RCA   ENDARTERECTOMY Left 06/02/2013   Procedure: ENDARTERECTOMY CAROTID-LEFT;  Surgeon: Nada Libman, MD;  Location: [facility] OR;  Service: Vascular;  Laterality: Left;   INTRAVASCULAR PRESSURE WIRE/FFR STUDY N/A 04/02/2020   Procedure: INTRAVASCULAR PRESSURE WIRE/FFR STUDY;  Surgeon: [name], MD;  Location: [facility];  Service: Cardiovascular;  Laterality: N/A;  DFR - RCA   LEFT HEART CATH AND CORONARY ANGIOGRAPHY N/A 04/02/2020   Procedure: LEFT HEART CATH AND CORONARY ANGIOGRAPHY;  Surgeon: [name], MD;  Location: [facility];  Service: Cardiovascular;  Laterality: N/A;   LEFT HEART CATHETERIZATION WITH CORONARY ANGIOGRAM N/A 02/08/2013   Procedure: LEFT HEART CATHETERIZATION WITH CORONARY ANGIOGRAM;  Surgeon: [name], MD;  Location: [facility] CATH LAB;  Service: Cardiovascular;  Laterality: N/A;   LOWER EXTREMITY ANGIOGRAM N/A 10/20/2013   Procedure: LOWER EXTREMITY ANGIOGRAM;  Surgeon: [name], MD;  Location: [facility] CATH LAB;  Service: Cardiovascular;  Laterality: N/A;   PATCH ANGIOPLASTY Left 06/02/2013   Procedure: PATCH ANGIOPLASTY;  Surgeon: Nada Libman, MD;  Location: [facility] OR;  Service: Vascular;  Laterality: Left;   PERIPHERAL VASCULAR INTERVENTION Bilateral 03/30/2018   Procedure: PERIPHERAL VASCULAR INTERVENTION;  Surgeon: Nada Libman, MD;  Location: [facility];  Service: Cardiovascular;  Laterality:  Bilateral;  common iliacs   PRESSURE SENSOR/CARDIOMEMS N/A 07/02/2022   Procedure: PRESSURE SENSOR/CARDIOMEMS;  Surgeon: Laurey Morale, MD;  Location: [facility] INVASIVE CV LAB;  Service: Cardiovascular;  Laterality: N/A;   RIGHT HEART CATH N/A 07/02/2022   Procedure: RIGHT HEART CATH;  Surgeon: Laurey Morale, MD;  Location: [facility] INVASIVE CV LAB;  Service: Cardiovascular;  Laterality: N/A;   RIGHT/LEFT HEART CATH AND CORONARY ANGIOGRAPHY N/A 05/20/2022   Procedure: RIGHT/LEFT HEART CATH AND CORONARY ANGIOGRAPHY;  Surgeon: Orbie Pyo, MD;  Location: [facility];  Service: Cardiovascular;  Laterality: N/A;   TONSILLECTOMY     Social History:  reports that he quit smoking about 4 years ago. His smoking use included cigarettes and e-cigarettes. He has a 85.50 pack-year smoking history. He has never used smokeless tobacco. He reports current alcohol use of about 1.0 standard drink of alcohol per week. He reports that he does not use drugs.  Allergies  Allergen Reactions   Fentanyl Other (See Comments)    Behavioral changes  Gabapentin Swelling   Lisinopril Other (See Comments)    Unknown reaction   Lyrica [Pregabalin] Other (See Comments)    Unknown reaction   Metformin Diarrhea   Propofol Other (See Comments)    Heart rate dropped    Family History  Problem Relation Age of Onset   Heart disease Father        heart attack at 52   Heart attack Father    Hyperlipidemia Father    Colonic polyp Mother        that bled out   COPD Mother    Diabetes Mother    Heart disease Sister    Colonic polyp Sister    Stroke Maternal Grandfather    Heart attack Paternal Grandfather    Other Neg Hx        hypogonadism    Prior to Admission medications   Medication Sig Start Date End Date Taking? Authorizing Provider  amiodarone (PACERONE) 200 MG tablet Take 1 tablet (200 mg total) by mouth 2 (two) times daily. 08/05/22   Laurey Morale, MD  amLODipine (NORVASC) 10 MG tablet Take 1  tablet (10 mg total) by mouth daily. 08/05/22 08/05/23  Laurey Morale, MD  apixaban (ELIQUIS) 2.5 MG TABS tablet Take 1 tablet (2.5 mg total) by mouth 2 (two) times daily. 08/05/22   Laurey Morale, MD  atorvastatin (LIPITOR) 40 MG tablet Take 1 tablet (40 mg total) by mouth daily. 08/15/22   Laurey Morale, MD  clopidogrel (PLAVIX) 75 MG tablet Take 1 tablet (75 mg total) by mouth daily. 08/15/22   Laurey Morale, MD  glipiZIDE (GLUCOTROL) 5 MG tablet Take 5 mg by mouth daily before breakfast.    [provider]  hydrALAZINE (APRESOLINE) 100 MG tablet Take 1 tablet (100 mg total) by mouth 3 (three) times daily. 08/05/22 09/05/22  Laurey Morale, MD  isosorbide mononitrate (IMDUR) 120 MG 24 hr tablet Take 1 tablet (120 mg total) by mouth daily. 08/15/22   Laurey Morale, MD  levothyroxine (SYNTHROID) 25 MCG tablet TAKE ONE TABLET BY MOUTH DAILY BEFORE BREAKFAST Patient taking differently: Take 75 mcg by mouth daily before breakfast. 03/13/22   [name], MD  ondansetron (ZOFRAN-ODT) 4 MG disintegrating tablet Take 1 tablet (4 mg total) by mouth every 8 (eight) hours as needed for up to 15 doses for nausea or vomiting. 08/28/22   Terald Sleeper, MD  pantoprazole (PROTONIX) 40 MG tablet Take 1 tablet (40 mg total) by mouth every other day. 07/30/22   Robbie Lis M, PA-C  torsemide (DEMADEX) 20 MG tablet Take 1 tablet (20 mg total) by mouth daily. 09/07/22   Coralie Keens, MD    Physical Exam: Vitals:   09/18/22 0930 09/18/22 0945 09/18/22 1015 09/18/22 1030  BP: (!) 213/127 (!) 213/99 (!) 222/96 (!) 212/102  Pulse: (!) 104 94 98   Resp: 18 (!) 22 (!) 29 (!) 25  Temp:      SpO2: 100% 98% 100%    Exam  Constitutional: Elderly male who appears to be in no acute distress at this time. Eyes: PERRL, lids and conjunctivae normal ENMT: Mucous membranes are dry. Neck: normal, supple, no significant JVD appreciated Respiratory: clear to auscultation  bilaterally, no wheezing, no crackles. Normal respiratory effort. No accessory muscle use.  Cardiovascular: Regular rate and rhythm, no murmurs / rubs / gallops.  Trace lower extremity edema.   Abdomen: No abdominal tenderness with ventral hernia appreciated in the right  part of the abdomen.  Bowel sounds appreciated in all 4 quadrants. Musculoskeletal: no clubbing / cyanosis. No joint deformity upper and lower extremities. Skin: no rashes, lesions, ulcers. No induration Neurologic: CN 2-12 grossly intact.  Strength 4/5 in all 4 Psychiatric: Patient is confused, but at least alert and oriented to self and place.  With repeated questioning was able to get time correct and states that he is here because he got sick and vomited.  Data Reviewed:  EKG reveals sinus rhythm with marked sinus arrhythmia at 98 bpm with first-degree heart block and QTc l.  Reviewed labs, imaging and pertinent records as noted above in HPI  Assessment and Plan: Acute kidney injury superimposed on chronic kidney disease Patient presents with creatinine of 3.18 with BUN 22 baseline creatinine previously had been around 1.8 prior to discharge on 12/24.  Patient has been given 1 L IV fluids.  Urinalysis did not show significant signs of infection but did have a significant amount of protein present -Admit to medical telemetry bed -Normal saline IV fluids at 50 mL/h -Check kidney function daily  Nausea and vomiting Patient had 1 episode of vomiting brownish material per his wife with complaints of nausea.  Patient denies having any abdominal pain at this time.  CT scan of the abdomen pelvis noted multiple renal cysts without clear cause for patient's symptoms.  Patient has been given 1 L   IV fluids. -Aspiration precautions with elevation head of the bed -Monitor intake and output -Antiemetics as needed -Advance diet as tolerated  Acute metabolic encephalopathy Patient's wife noted that he was not acting like his normal  self.  On physical exam patient noted to be alert and oriented to person and place, but not time.  Question if symptoms  CAD Elevated troponin  Chronic.  Patient denies any complaints of chest pain high-sensitivity troponin elevated at 71. -Continue Plavix and statin  Leukocytosis Acute.  WBC manage the 16.1.  Urinalysis did not show sign of infection and chest x-ray was otherwise clear.  Possibly reactive to patient having episode of vomiting. -Continue to monitor  Hypokalemia Acute.  Patient had been given potassium chloride 10 mEq IV and had been ordered additional 50 meq, but complained of burning from the site. -Give 40 mEq of potassium chloride p.o. -Continue to monitor and replace as needed  Paroxysmal atrial fibrillation on chronic anticoagulation Patient appears to be in a sinus rhythm at this time. -Continue amiodarone and Eliquis  Heart failure with preserved EF Chronic.  Patient appears to be compensated at this time.  Last echocardiogram on 09/05/2022 noted EF to be 55-60% with normal RV function and improved LV systolic function from 04/2022 echo. -Strict I&O's and daily weights  Hyponatremia Acute.  Sodium 129 after patient reported to have recent episode of vomiting as a likely cause. -Continue to monitor sodium levels with rehydration  Polycythemia Acute.  Hemoglobin 18.9, but baseline had previously been around 13.3 on 12/22. -Continue to monitor with IV fluid rehydration   Diabetes mellitus type 2, with hyperlipidemia Last hemoglobin A1c was 7.2 back in 04/2022.  Home medication regimen includes glipizide. -Hypoglycemic protocols -Hold glipizide -Continue atorvastatin -CBGs before every meal with sensitive SSI -NovoLog 0 to 5 units nightly -Adjust insulin regimen as needed  Hypothyroidism -Continue levothyroxine  Lesion of right native kidney Indeterminate lesion of the posterior right kidney measuring 1.4 cm noted on CT renal stone study.  Nonemergent  renal protocol MRI recommended for further evaluation.  DVT prophylaxis: Eliquis  Advance Care Planning:   Code Status: Full Code confirmed by the patient's wife over the phone  Consults: None  Family Communication: Wife updated over the phone  Severity of Illness: The appropriate patient status for this patient is INPATIENT. Inpatient status is judged to be reasonable and necessary in order to provide the required intensity of service to ensure the patient's safety. The patient's presenting symptoms, physical exam findings, and initial radiographic and laboratory data in the context of their chronic comorbidities is felt to place them at high risk for further clinical deterioration. Furthermore, it is not anticipated that the patient will be medically stable for discharge from the hospital within 2 midnights of admission.   * I certify that at the point of admission it is my clinical judgment that the patient will require inpatient hospital care spanning beyond 2 midnights from the point of admission due to high intensity of service, high risk for further deterioration and high frequency of surveillance required.*  Author: Norval Morton, MD 09/18/2022 11:16 AM  For on call review [email or web address].

## 2022-09-19 DIAGNOSIS — N179 Acute kidney failure, unspecified: Secondary | ICD-10-CM | POA: Diagnosis not present

## 2022-09-19 DIAGNOSIS — N189 Chronic kidney disease, unspecified: Secondary | ICD-10-CM | POA: Diagnosis not present

## 2022-09-19 LAB — CBC
HCT: 46 % (ref 39.0–52.0)
Hemoglobin: 16.2 g/dL (ref 13.0–17.0)
MCH: 29.6 pg (ref 26.0–34.0)
MCHC: 35.2 g/dL (ref 30.0–36.0)
MCV: 84.1 fL (ref 80.0–100.0)
Platelets: 292 10*3/uL (ref 150–400)
RBC: 5.47 MIL/uL (ref 4.22–5.81)
RDW: 14.6 % (ref 11.5–15.5)
WBC: 11.9 10*3/uL — ABNORMAL HIGH (ref 4.0–10.5)
nRBC: 0 % (ref 0.0–0.2)

## 2022-09-19 LAB — BASIC METABOLIC PANEL
Anion gap: 21 — ABNORMAL HIGH (ref 5–15)
BUN: 26 mg/dL — ABNORMAL HIGH (ref 8–23)
CO2: 18 mmol/L — ABNORMAL LOW (ref 22–32)
Calcium: 8.7 mg/dL — ABNORMAL LOW (ref 8.9–10.3)
Chloride: 91 mmol/L — ABNORMAL LOW (ref 98–111)
Creatinine, Ser: 2.61 mg/dL — ABNORMAL HIGH (ref 0.61–1.24)
GFR, Estimated: 24 mL/min — ABNORMAL LOW (ref >60)
Glucose, Bld: 163 mg/dL — ABNORMAL HIGH (ref 70–99)
Potassium: 3.7 mmol/L (ref 3.5–5.1)
Sodium: 130 mmol/L — ABNORMAL LOW (ref 135–145)

## 2022-09-19 LAB — GLUCOSE, CAPILLARY
Glucose-Capillary: 175 mg/dL — ABNORMAL HIGH (ref 70–99)
Glucose-Capillary: 180 mg/dL — ABNORMAL HIGH (ref 70–99)
Glucose-Capillary: 159 mg/dL — ABNORMAL HIGH (ref 70–99)
Glucose-Capillary: 174 mg/dL — ABNORMAL HIGH (ref 70–99)

## 2022-09-19 LAB — MAGNESIUM: Magnesium: 2 mg/dL (ref 1.7–2.4)

## 2022-09-19 LAB — TROPONIN I (HIGH SENSITIVITY): Troponin I (High Sensitivity): 60 ng/L — ABNORMAL HIGH (ref <18)

## 2022-09-19 MED ORDER — ONDANSETRON HCL 4 MG/2ML IJ SOLN
4.0000 mg | Freq: Four times a day (QID) | INTRAMUSCULAR | Status: DC | PRN
  Administered 2022-09-19 – 2022-09-20 (×6): 4 mg via INTRAVENOUS
  Filled 2022-09-19 (×7): qty 2

## 2022-09-19 NOTE — Progress Notes (Addendum)
PROGRESS NOTE    Albert Powell  ZCH:[id number] DOB: Mar 27, 1941 DOA: 09/17/2022 PCP: Patient, No Pcp Per    Brief Narrative:  Albert Powell is a 82 y.o. male with medical history significant of HTN, HLD, PAF on Eliquis, HFpEF, CAD s/p PCI, diabetes mellitus type 2, CKD stage 3b, hypothyroidism, and mild dementia who presents after having episode of vomiting.  The patient denies having any recent fever, chills, abdominal pain, dysuria, diarrhea, but has not been eating well.  Patient had just recently been hospitalized from 12/21-12/24/2023 after having a syncopal episode at home and was found down  by his wife.  He was found to have acute kidney injury and hydrated with improvement in symptoms.    Assessment and Plan: Acute kidney injury superimposed on chronic kidney disease Patient presents with creatinine of 3.18 with BUN 22 baseline creatinine previously had been around 1.8 prior to discharge on 12/24.  Patient has been given 1 L IV fluids.  Urinalysis did not show significant signs of infection but did have a significant amount of protein present -Normal saline IV fluids at 50 mL/h -lab still pending this AM   Nausea and vomiting/diarrhea Patient had 1 episode of vomiting brownish material per his wife with complaints of nausea.  Patient denies having any abdominal pain at this time.  CT scan of the abdomen pelvis noted multiple renal cysts without clear cause for patient's symptoms.  Patient has been given 1 L   IV fluids. -Aspiration precautions with elevation head of the bed -Monitor intake and output -Antiemetics as needed -Advance diet as tolerated -stool studies ordered to r/o c diff and other GI pathogens   Acute metabolic encephalopathy Patient's wife noted that he was not acting like his normal self.  On physical exam patient noted to be alert and oriented to person and place, but not time.     CAD Elevated troponin  Chronic.  Patient denies any complaints of chest  pain high-sensitivity troponin elevated at 71- suspect due to AKI -Continue Plavix and statin   Leukocytosis Acute.  WBC manage the 16.1.  Urinalysis did not show sign of infection and chest x-ray was otherwise clear.  Possibly reactive to patient having episode of vomiting. -await labs this AM   Hypokalemia Acute.  Patient had been given potassium chloride 10 mEq IV and had been ordered additional 50 meq, but complained of burning from the site. -Give 40 mEq of potassium chloride p.o. -check Mg-- await labs this AM   Paroxysmal atrial fibrillation on chronic anticoagulation Patient appears to be in a sinus rhythm at this time. -Continue amiodarone and Eliquis   Heart failure with preserved EF Chronic.  Patient appears to be compensated at this time.  Last echocardiogram on 09/05/2022 noted EF to be 55-60% with normal RV function and improved LV systolic function from 04/2022 echo. -Strict I&O's and daily weights   Hyponatremia Acute.  Sodium 129 after patient reported to have recent episode of vomiting as a likely cause. -await labs   Polycythemia Acute.  Hemoglobin 18.9, but baseline had previously been around 13.3 on 12/22. -suspect due to dehydration   Diabetes mellitus type 2, with hyperlipidemia Last hemoglobin A1c was 7.2 back in 04/2022.  Home medication regimen includes glipizide. -Hypoglycemic protocols -Hold glipizide -Continue atorvastatin -CBGs before every meal with sensitive SSI -NovoLog 0 to 5 units nightly -Adjust insulin regimen as needed   Hypothyroidism -Continue levothyroxine   Lesion of right native kidney Indeterminate lesion of the posterior  right kidney measuring 1.4 cm noted on CT renal stone study.  Nonemergent renal protocol MRI recommended for further evaluation as an outpatient     DVT prophylaxis: apixaban (ELIQUIS) tablet 2.5 mg Start: 09/18/22 1100 apixaban (ELIQUIS) tablet 2.5 mg    Code Status: Full Code   Disposition Plan:  Level of  care: Telemetry Medical Status is: Inpatient Remains inpatient appropriate because: needs labs    Consultants:  none   Subjective: No labs drawn this AM No abdominal pain  Objective: Vitals:   09/18/22 1600 09/18/22 2008 09/18/22 2341 09/19/22 0355  BP:  117/72 (!) 136/104 120/76  Pulse:  82 93 97  Resp:  18 18 17   Temp:  97.6 F (36.4 C) (!) 97.5 F (36.4 C) (!) 97.5 F (36.4 C)  TempSrc:  Oral Oral Oral  SpO2:  97% 99% 99%  Weight: 84.6 kg 89.5 kg    Height: 5\' 9"  (1.753 m) 5\' 9"  (1.753 m)      Intake/Output Summary (Last 24 hours) at 09/19/2022 4401 Last data filed at 09/19/2022 0272 Gross per 24 hour  Intake 3055.95 ml  Output 1600 ml  Net 1455.95 ml   Filed Weights   09/18/22 1535 09/18/22 1600 09/18/22 2008  Weight: 83.7 kg 84.6 kg 89.5 kg    Examination:   General: Appearance:     Overweight male in no acute distress     Lungs:     respirations unlabored  Heart:    Normal heart rate.    MS:   All extremities are intact.    Neurologic:   Awake, alert       Data Reviewed: I have personally reviewed following labs and imaging studies  CBC: Recent Labs  Lab 09/18/22 0129  WBC 16.1*  HGB 18.9*  HCT 53.0*  MCV 85.6  PLT 536   Basic Metabolic Panel: Recent Labs  Lab 09/18/22 0129  NA 129*  K 2.5*  CL 85*  CO2 18*  GLUCOSE 235*  BUN 22  CREATININE 3.18*  CALCIUM 9.8   GFR: Estimated Creatinine Clearance: 20.5 mL/min (A) (by C-G formula based on SCr of 3.18 mg/dL (H)). Liver Function Tests: No results for input(s): "AST", "ALT", "ALKPHOS", "BILITOT", "PROT", "ALBUMIN" in the last 168 hours. No results for input(s): "LIPASE", "AMYLASE" in the last 168 hours. No results for input(s): "AMMONIA" in the last 168 hours. Coagulation Profile: No results for input(s): "INR", "PROTIME" in the last 168 hours. Cardiac Enzymes: No results for input(s): "CKTOTAL", "CKMB", "CKMBINDEX", "TROPONINI" in the last 168 hours. BNP (last 3 results) No  results for input(s): "PROBNP" in the last 8760 hours. HbA1C: No results for input(s): "HGBA1C" in the last 72 hours. CBG: Recent Labs  Lab 09/18/22 0129 09/18/22 1604 09/18/22 2009 09/19/22 0735  GLUCAP 253* 200* 189* 175*   Lipid Profile: No results for input(s): "CHOL", "HDL", "LDLCALC", "TRIG", "CHOLHDL", "LDLDIRECT" in the last 72 hours. Thyroid Function Tests: No results for input(s): "TSH", "T4TOTAL", "FREET4", "T3FREE", "THYROIDAB" in the last 72 hours. Anemia Panel: No results for input(s): "VITAMINB12", "FOLATE", "FERRITIN", "TIBC", "IRON", "RETICCTPCT" in the last 72 hours. Sepsis Labs: No results for input(s): "PROCALCITON", "LATICACIDVEN" in the last 168 hours.  Recent Results (from the past 240 hour(s))  Resp panel by RT-PCR (RSV, Flu A&B, Covid) Anterior Nasal Swab     Status: None   Collection Time: 09/18/22  7:47 AM   Specimen: Anterior Nasal Swab  Result Value Ref Range Status   SARS Coronavirus 2 by RT  PCR NEGATIVE NEGATIVE Final    Comment: (NOTE) SARS-CoV-2 target nucleic acids are NOT DETECTED.  The SARS-CoV-2 RNA is generally detectable in upper respiratory specimens during the acute phase of infection. The lowest concentration of SARS-CoV-2 viral copies this assay can detect is 138 copies/mL. A negative result does not preclude SARS-Cov-2 infection and should not be used as the sole basis for treatment or other patient management decisions. A negative result may occur with  improper specimen collection/handling, submission of specimen other than nasopharyngeal swab, presence of viral mutation(s) within the areas targeted by this assay, and inadequate number of viral copies(<138 copies/mL). A negative result must be combined with clinical observations, patient history, and epidemiological information. The expected result is Negative.  Fact Sheet for Patients:  EntrepreneurPulse.com.au  Fact Sheet for Healthcare Providers:   IncredibleEmployment.be  This test is no t yet approved or cleared by the [place] FDA and  has been authorized for detection and/or diagnosis of SARS-CoV-2 by FDA under an Emergency Use Authorization (EUA). This EUA will remain  in effect (meaning this test can be used) for the duration of the COVID-19 declaration under Section 564(b)(1) of the Act, 21 U.S.C.section 360bbb-3(b)(1), unless the authorization is terminated  or revoked sooner.       Influenza A by PCR NEGATIVE NEGATIVE Final   Influenza B by PCR NEGATIVE NEGATIVE Final    Comment: (NOTE) The Xpert Xpress SARS-CoV-2/FLU/RSV plus assay is intended as an aid in the diagnosis of influenza from Nasopharyngeal swab specimens and should not be used as a sole basis for treatment. Nasal washings and aspirates are unacceptable for Xpert Xpress SARS-CoV-2/FLU/RSV testing.  Fact Sheet for Patients: EntrepreneurPulse.com.au  Fact Sheet for Healthcare Providers: IncredibleEmployment.be  This test is not yet approved or cleared by the [place] FDA and has been authorized for detection and/or diagnosis of SARS-CoV-2 by FDA under an Emergency Use Authorization (EUA). This EUA will remain in effect (meaning this test can be used) for the duration of the COVID-19 declaration under Section 564(b)(1) of the Act, 21 U.S.C. section 360bbb-3(b)(1), unless the authorization is terminated or revoked.     Resp Syncytial Virus by PCR NEGATIVE NEGATIVE Final    Comment: (NOTE) Fact Sheet for Patients: EntrepreneurPulse.com.au  Fact Sheet for Healthcare Providers: IncredibleEmployment.be  This test is not yet approved or cleared by the [place] FDA and has been authorized for detection and/or diagnosis of SARS-CoV-2 by FDA under an Emergency Use Authorization (EUA). This EUA will remain in effect (meaning this test can be used) for  the duration of the COVID-19 declaration under Section 564(b)(1) of the Act, 21 U.S.C. section 360bbb-3(b)(1), unless the authorization is terminated or revoked.  Performed at [facility], [place] [address] [place], [place] [address]          Radiology Studies: CT ABDOMEN PELVIS WO CONTRAST  Result Date: 09/18/2022 CLINICAL DATA:  Nausea vomiting. Acute kidney injury. Bowel obstruction suspected. EXAM: CT ABDOMEN AND PELVIS WITHOUT CONTRAST TECHNIQUE: Multidetector CT imaging of the abdomen and pelvis was performed following the standard protocol without IV contrast. RADIATION DOSE REDUCTION: This exam was performed according to the departmental dose-optimization program which includes automated exposure control, adjustment of the mA and/or kV according to patient size and/or use of iterative reconstruction technique. COMPARISON:  CT renal stone protocol 09/04/2022 FINDINGS: Lower chest: 2 adjacent elongated densities in the right lower lobe on image 9/5 that have minimally changed since the recent comparison examination. Largest density measures up  to 4 mm and likely an incidental finding. No pleural effusions. Hepatobiliary: Normal appearance of the liver. High-density material in the gallbladder is suggestive for sludge. No significant gallbladder distension. No evidence for gallbladder inflammatory changes. Pancreas: Unremarkable. No pancreatic ductal dilatation or surrounding inflammatory changes. Spleen: Normal in size without focal abnormality. Adrenals/Urinary Tract: Again noted is a low-density nodule in left adrenal gland that is suggestive for an adenoma based on the Hounsfield units measuring less than 0. Normal appearance of the right adrenal gland. Left kidney is atrophic with low-density cysts. Again noted is an exophytic hyperdense structure along the anterior aspect of the right kidney. This structure measures up to 1.2 cm on image 31/3 and minimally changed since the recent  comparison examination. Hounsfield units measure approximately 51. Suspect this is a hyperdense or proteinaceous cyst. Additional low-density cysts in the right kidney. Negative for hydronephrosis. Normal appearance of the urinary bladder. No ureter stones. Calcifications involving both kidneys are most likely vascular. No definite urinary calculi. Stomach/Bowel: Small hiatal hernia. Normal appearance of the stomach. Again noted are loops of small bowel involving a right paracentral upper abdominal ventral hernia. There is no evidence for bowel dilatation or obstruction. No focal bowel inflammation. Evidence for colectomy with ileal-rectal anastomosis. Moderate amount of stool in the rectum. No acute bowel inflammation. Vascular/Lymphatic: Abdominal aorta and iliac arteries are heavily calcified. No evidence for an aortic aneurysm. Stents involving the common iliac arteries bilaterally. Significant calcifications involving the right common femoral artery. No significant lymph node enlargement in the abdomen or pelvis. Reproductive: Prostate is stable and unremarkable. Other: Negative for free fluid. Evidence for postoperative changes along the anterior abdominal wall. Upper abdominal right paracentral ventral hernia containing small bowel. There is small left paracentral ventral hernia in the upper abdomen containing fat. Laxity along the right paracentral lower abdominal wall. Negative for free air. Musculoskeletal: Spinal stimulator with the generator in the right buttock. Spinal stimulator is incompletely imaged. Disc space narrowing at L4-L5 and L5-S1. No acute bone abnormality. IMPRESSION: 1. No acute abnormality in the abdomen or pelvis. 2. Right upper abdominal ventral hernia containing small bowel but no evidence for obstruction or acute inflammation. 3. Status post colectomy. 4. Atrophic left kidney. 5. Multiple renal cysts including a probable proteinaceous or hemorrhagic right renal cyst. These are likely  benign renal cysts and not require dedicated follow-up. 6.  Aortic Atherosclerosis (ICD10-I70.0). Electronically Signed   By: Richarda Overlie M.D.   On: 09/18/2022 09:48   DG Chest 1 View  Result Date: 09/17/2022 CLINICAL DATA:  Chest pain. EXAM: CHEST  1 VIEW COMPARISON:  09/04/2022 FINDINGS: Heart size and mediastinal contours are unremarkable. Implanted device along the left heart border is unchanged from the previous exam. Dorsal column stimulator is noted with lead terminating in the midthoracic spine. No pleural effusion or edema. No airspace opacities identified. IMPRESSION: No active disease. Electronically Signed   By: Signa Kell M.D.   On: 09/17/2022 14:53        Scheduled Meds:  amiodarone  200 mg Oral BID   amLODipine  10 mg Oral Daily   apixaban  2.5 mg Oral BID   atorvastatin  40 mg Oral Daily   clopidogrel  75 mg Oral Daily   hydrALAZINE  100 mg Oral TID   insulin aspart  0-5 Units Subcutaneous QHS   insulin aspart  0-9 Units Subcutaneous TID WC   isosorbide mononitrate  120 mg Oral Daily   levothyroxine  75 mcg Oral Q0600  torsemide  20 mg Oral Daily   Continuous Infusions:  sodium chloride 50 mL/hr at 09/19/22 0627     LOS: 1 day    Time spent: 45 minutes spent on chart review, discussion with nursing staff, consultants, updating family and interview/physical exam; more than 50% of that time was spent in counseling and/or coordination of care.    Joseph Art, DO [facility] Available via Epic secure chat 7am-7pm After these hours, please refer to coverage provider listed on [email or web address] 09/19/2022, 8:07 AM

## 2022-09-19 NOTE — TOC Progression Note (Signed)
Transition of Care [facility]) - Initial/Assessment Note    Patient Details  Name: Albert Powell MRN: [id number] Date of Birth: [birth year]  Transition of Care [facility]) CM/SW Contact:    Milinda Antis, LCSWA Phone Number: 09/19/2022, 2:43 PM  Clinical Narrative:                 Transition of Care Department [facility]) has reviewed patient.  Patient admitted for AKI. We will continue to monitor patient advancement through interdisciplinary progression rounds. If new patient transition needs arise, please place a TOC consult.          Patient Goals and CMS Choice            Expected Discharge Plan and Services                                              Prior Living Arrangements/Services                       Activities of Daily Living Home Assistive Devices/Equipment: Shower chair with back, Grab bars in shower, Hand-held shower hose, Walker (specify type) (4 wheel walker) ADL Screening (condition at time of admission) Patient's cognitive ability adequate to safely complete daily activities?: Yes Is the patient deaf or have difficulty hearing?: No Does the patient have difficulty seeing, even when wearing glasses/contacts?: No Does the patient have difficulty concentrating, remembering, or making decisions?: No Patient able to express need for assistance with ADLs?: Yes Does the patient have difficulty dressing or bathing?: Yes Independently performs ADLs?: No Communication: Independent Dressing (OT): Needs assistance Is this a change from baseline?: Pre-admission baseline Grooming: Independent Feeding: Independent Bathing: Needs assistance Is this a change from baseline?: Pre-admission baseline Toileting: Independent In/Out Bed: Independent Walks in Home: Independent with device (comment) (4 wheel walker) Does the patient have difficulty walking or climbing stairs?: Yes Weakness of Legs: Both Weakness of Arms/Hands: None  Permission Sought/Granted                   Emotional Assessment              Admission diagnosis:  Hypokalemia [E87.6] AKI (acute kidney injury) ([place]) [N17.9] Acute kidney injury superimposed on chronic kidney disease ([place]) [N17.9, N18.9] Nausea and vomiting, unspecified vomiting type [R11.2] Patient Active Problem List   Diagnosis Date Noted   Nausea and vomiting 09/18/2022   Heart failure with preserved ejection fraction ([place]) 09/18/2022   AKI (acute kidney injury) ([place]) 09/06/2022   Chronic combined systolic and diastolic CHF (congestive heart failure) ([place]) 09/04/2022   Near syncope 09/04/2022   Hypokalemia 06/23/2022   Elevated troponin 06/23/2022   Type 2 diabetes mellitus with hyperlipidemia ([place]) 05/19/2022   Acute combined systolic and diastolic heart failure ([place])    Acute kidney injury superimposed on chronic kidney disease ([place]) 05/13/2022   GERD (gastroesophageal reflux disease) 05/13/2022   Acquired hypothyroidism 05/13/2022   Paroxysmal atrial fibrillation ([place]) 24/40/1027   Acute metabolic encephalopathy    CHF (congestive heart failure) ([place]) 03/10/2021   Essential hypertension 03/10/2021   Hypertensive crisis 01/01/2021   ACS (acute coronary syndrome) ([place])    Lesion of right native kidney    Atrial fibrillation ([place]) 03/30/2020   Contusion of right hand 11/12/2018   Primary osteoarthritis of both first carpometacarpal joints 11/12/2018   Pain in joint of right  shoulder 10/05/2018   Pain in right hand 10/05/2018   Hematoma 03/30/2018   Degeneration of lumbar intervertebral disc 12/21/2017   Lumbar radiculopathy 12/21/2017   Vertigo 04/09/2017   Hypogonadism in male 12/07/2014   Screening for prostate cancer 12/07/2014   Claudication of right lower extremity ([place]) 10/20/2013   Claudication in peripheral vascular disease- Rt leg 09/20/2013   Obesity (BMI 30-39.9)- negative sleep study in the past 09/20/2013   Occlusion and stenosis of carotid artery without mention of  cerebral infarction 05/23/2013   PAD (peripheral artery disease) ([place]) 05/18/2013   Carotid artery disease ([place]) 04/14/2013   Bradycardia, sinus 02/18/2013   Tobacco abuse 02/05/2013   Chest pain, unstable angina, negative MI 02/04/2013   Coronary artery disease involving native coronary artery of native heart without angina pectoris 02/04/2013   Benign essential hypertension 07/10/2011   Incisional hernia 05/22/2011   PCP:  Patient, No Pcp Per Pharmacy:   [facility] - [place], [place] - [address] [place] [address] Phone: [phone] Fax: [phone]     Social Determinants of Health (SDOH) Social History: [place]: No Food Insecurity (09/18/2022)  Housing: Low Risk  (09/18/2022)  Transportation Needs: No Transportation Needs (09/18/2022)  Recent Concern: Transportation Needs - Unmet Transportation Needs (08/21/2022)  Utilities: Not At Risk (09/18/2022)  Alcohol Screen: Low Risk  (06/03/2022)  Depression (PHQ2-9): High Risk (08/14/2022)  Financial Resource Strain: Low Risk  (06/03/2022)  Tobacco Use: Medium Risk (09/18/2022)   SDOH Interventions:     Readmission Risk Interventions     No data to display

## 2022-09-20 DIAGNOSIS — I480 Paroxysmal atrial fibrillation: Secondary | ICD-10-CM | POA: Diagnosis not present

## 2022-09-20 DIAGNOSIS — N179 Acute kidney failure, unspecified: Secondary | ICD-10-CM | POA: Diagnosis not present

## 2022-09-20 DIAGNOSIS — E039 Hypothyroidism, unspecified: Secondary | ICD-10-CM | POA: Diagnosis not present

## 2022-09-20 DIAGNOSIS — E876 Hypokalemia: Secondary | ICD-10-CM | POA: Diagnosis not present

## 2022-09-20 LAB — BASIC METABOLIC PANEL
Anion gap: 15 (ref 5–15)
BUN: 18 mg/dL (ref 8–23)
CO2: 24 mmol/L (ref 22–32)
Calcium: 8.3 mg/dL — ABNORMAL LOW (ref 8.9–10.3)
Chloride: 89 mmol/L — ABNORMAL LOW (ref 98–111)
Creatinine, Ser: 2.14 mg/dL — ABNORMAL HIGH (ref 0.61–1.24)
GFR, Estimated: 31 mL/min — ABNORMAL LOW (ref >60)
Glucose, Bld: 147 mg/dL — ABNORMAL HIGH (ref 70–99)
Potassium: 2.2 mmol/L — CL (ref 3.5–5.1)
Sodium: 128 mmol/L — ABNORMAL LOW (ref 135–145)

## 2022-09-20 LAB — CBC
HCT: 41.9 % (ref 39.0–52.0)
Hemoglobin: 14.9 g/dL (ref 13.0–17.0)
MCH: 29.5 pg (ref 26.0–34.0)
MCHC: 35.6 g/dL (ref 30.0–36.0)
MCV: 83 fL (ref 80.0–100.0)
Platelets: 281 10*3/uL (ref 150–400)
RBC: 5.05 MIL/uL (ref 4.22–5.81)
RDW: 14.6 % (ref 11.5–15.5)
WBC: 10.8 10*3/uL — ABNORMAL HIGH (ref 4.0–10.5)
nRBC: 0 % (ref 0.0–0.2)

## 2022-09-20 LAB — GLUCOSE, CAPILLARY
Glucose-Capillary: 296 mg/dL — ABNORMAL HIGH (ref 70–99)
Glucose-Capillary: 175 mg/dL — ABNORMAL HIGH (ref 70–99)
Glucose-Capillary: 181 mg/dL — ABNORMAL HIGH (ref 70–99)
Glucose-Capillary: 158 mg/dL — ABNORMAL HIGH (ref 70–99)

## 2022-09-20 LAB — POTASSIUM: Potassium: 2.3 mmol/L — CL (ref 3.5–5.1)

## 2022-09-20 MED ORDER — POTASSIUM CHLORIDE 10 MEQ/100ML IV SOLN
10.0000 meq | INTRAVENOUS | Status: AC
  Administered 2022-09-20 – 2022-09-21 (×4): 10 meq via INTRAVENOUS
  Filled 2022-09-20 (×4): qty 100

## 2022-09-20 MED ORDER — SODIUM CHLORIDE 0.9 % IV SOLN
12.5000 mg | Freq: Four times a day (QID) | INTRAVENOUS | Status: DC | PRN
  Administered 2022-09-20 – 2022-09-21 (×2): 12.5 mg via INTRAVENOUS
  Filled 2022-09-20: qty 12.5
  Filled 2022-09-20: qty 0.5
  Filled 2022-09-20: qty 12.5

## 2022-09-20 MED ORDER — POTASSIUM CHLORIDE CRYS ER 20 MEQ PO TBCR
40.0000 meq | EXTENDED_RELEASE_TABLET | ORAL | Status: AC
  Administered 2022-09-20 (×2): 40 meq via ORAL
  Filled 2022-09-20 (×2): qty 2

## 2022-09-20 NOTE — Hospital Course (Signed)
Albert Powell is a 82 y.o. male with a history of hypertension, hyperlipidemia, paroxysmal atrial fibrillation, HFpER, CAD s/p PCI, diabetes mellitus type 2, CKD stage IIIb, hypothyroidism, dementia. Patient presented secondary to vomiting and syncopal episode, found to have evidence of AKI with possible infectious gastroenteritis. Supportive care with IV fluids.

## 2022-09-20 NOTE — Progress Notes (Signed)
PROGRESS NOTE    Albert Powell  PYP:[id number] DOB: 06/28/1941 DOA: 09/17/2022 PCP: Patient, No Pcp Per   Brief Narrative: Albert Powell is a 82 y.o. male with a history of hypertension, hyperlipidemia, paroxysmal atrial fibrillation, HFpER, CAD s/p PCI, diabetes mellitus type 2, CKD stage IIIb, hypothyroidism, dementia. Patient presented secondary to vomiting and syncopal episode, found to have evidence of AKI with possible infectious gastroenteritis. Supportive care with IV fluids.   Assessment and Plan:  AKI on CKD stage IIIb Baseline creatinine is about 1.8-2. Creatinine of 3.18 on admission. Patient started on IV fluids. Creatinine with steady improvement. -Continue IV fluids -BMP in AM  Nausea/vomiting/diarrhea Concern for possible infectious etiology. Diarrhea improved. Continued dry heaves. GI pathogen panel and C. Difficile testing pending. -Continue Zofran  and trimethobenzamide PRN  Acute metabolic encephalopathy Likely secondary to dehydration. Patient appears to be fully oriented fully at this time.  CAD Noted. No chest pain. -Continue Plavix and Lipitor  Myocardial injury Mild troponin elevation of 71 > 60, not consistent with ACS. No chest pain.  Leukocytosis WBC of 16,100 on admission. Secondary to acute illness. Trending down.  Hypokalemia Worsened secondary to diuresis, however, patient has a history of hypokalemia. -Potassium supplementation -Repeat potassium this evening and further replete as necessary  Paroxysmal atrial fibrillation Noted. Currently in sinus rhythm. -Continue amiodarone and Eliquis  Chronic heart failure with recovered EF Transthoracic Echocardiogram from 08/2022 significant for LVEF of 55-60% with indeterminate diastolic parameters.  Hyponatremia Chronic issues since one month prior. Mild. Stable.  Polycythemia Transient, likely secondary to dehydration on admission. Resolved.  Diabetes mellitus type 2 Most recent  hemoglobin A1C which, for age, indicates well controlled diabetes..  Hypothyroidism -Continue Synthroid  Multiple renal cysts Noted on CT abdomen/pelvis and read as likely benign renal cysts, not requiring dedicated follow-up.   DVT prophylaxis: Eliquis Code Status:   Code Status: Full Code Family Communication: None at bedside Disposition Plan: Discharge pending improvement of nausea/vomiting, stability of electrolytes and PT/OT recommendations   Consultants:  None  Procedures:  None  Antimicrobials: None    Subjective: Patient reports continued emesis, but mostly dry heaving. No diarrhea.  Objective: BP (!) 117/59 (BP Location: Right Arm)   Pulse (!) 101   Temp (!) 97.5 F (36.4 C) (Oral)   Resp 18   Ht 5\' 9"  (1.753 m)   Wt 89.5 kg   SpO2 97%   BMI 29.14 kg/m   Examination:  General exam: Appears calm and not well appearing Respiratory system: Clear to auscultation. Respiratory effort normal. Cardiovascular system: S1 & S2 heard, RRR. Gastrointestinal system: Abdomen is nondistended, soft and nontender. Normal bowel sounds heard. Central nervous system: Alert and oriented to person, place and time. Musculoskeletal: No calf tenderness Skin: No cyanosis. No rashes Psychiatry: Judgement and insight appear normal. Mood & affect appropriate.    Data Reviewed: I have personally reviewed following labs and imaging studies  CBC Lab Results  Component Value Date   WBC 10.8 (H) 09/20/2022   RBC 5.05 09/20/2022   HGB 14.9 09/20/2022   HCT 41.9 09/20/2022   MCV 83.0 09/20/2022   MCH 29.5 09/20/2022   PLT 281 09/20/2022   MCHC 35.6 09/20/2022   RDW 14.6 09/20/2022   LYMPHSABS 0.9 08/28/2022   MONOABS 0.6 08/28/2022   EOSABS 0.0 08/28/2022   BASOSABS 0.0 24/58/0998     Last metabolic panel Lab Results  Component Value Date   NA 130 (L) 09/19/2022   K 3.7 09/19/2022  CL 91 (L) 09/19/2022   CO2 18 (L) 09/19/2022   BUN 26 (H) 09/19/2022   CREATININE  2.61 (H) 09/19/2022   GLUCOSE 163 (H) 09/19/2022   GFRNONAA 24 (L) 09/19/2022   GFRAA 52 (L) 08/17/2020   CALCIUM 8.7 (L) 09/19/2022   PHOS 2.7 05/14/2022   PROT 6.2 (L) 09/04/2022   ALBUMIN 3.5 09/04/2022   BILITOT 1.1 09/04/2022   ALKPHOS 76 09/04/2022   AST 23 09/04/2022   ALT 23 09/04/2022   ANIONGAP 21 (H) 09/19/2022    GFR: Estimated Creatinine Clearance: 25 mL/min (A) (by C-G formula based on SCr of 2.61 mg/dL (H)).  Recent Results (from the past 240 hour(s))  Resp panel by RT-PCR (RSV, Flu A&B, Covid) Anterior Nasal Swab     Status: None   Collection Time: 09/18/22  7:47 AM   Specimen: Anterior Nasal Swab  Result Value Ref Range Status   SARS Coronavirus 2 by RT PCR NEGATIVE NEGATIVE Final    Comment: (NOTE) SARS-CoV-2 target nucleic acids are NOT DETECTED.  The SARS-CoV-2 RNA is generally detectable in upper respiratory specimens during the acute phase of infection. The lowest concentration of SARS-CoV-2 viral copies this assay can detect is 138 copies/mL. A negative result does not preclude SARS-Cov-2 infection and should not be used as the sole basis for treatment or other patient management decisions. A negative result may occur with  improper specimen collection/handling, submission of specimen other than nasopharyngeal swab, presence of viral mutation(s) within the areas targeted by this assay, and inadequate number of viral copies(<138 copies/mL). A negative result must be combined with clinical observations, patient history, and epidemiological information. The expected result is Negative.  Fact Sheet for Patients:  BloggerCourse.com  Fact Sheet for Healthcare Providers:  SeriousBroker.it  This test is no t yet approved or cleared by the [place] FDA and  has been authorized for detection and/or diagnosis of SARS-CoV-2 by FDA under an Emergency Use Authorization (EUA). This EUA will remain  in  effect (meaning this test can be used) for the duration of the COVID-19 declaration under Section 564(b)(1) of the Act, 21 U.S.C.section 360bbb-3(b)(1), unless the authorization is terminated  or revoked sooner.       Influenza A by PCR NEGATIVE NEGATIVE Final   Influenza B by PCR NEGATIVE NEGATIVE Final    Comment: (NOTE) The Xpert Xpress SARS-CoV-2/FLU/RSV plus assay is intended as an aid in the diagnosis of influenza from Nasopharyngeal swab specimens and should not be used as a sole basis for treatment. Nasal washings and aspirates are unacceptable for Xpert Xpress SARS-CoV-2/FLU/RSV testing.  Fact Sheet for Patients: BloggerCourse.com  Fact Sheet for Healthcare Providers: SeriousBroker.it  This test is not yet approved or cleared by the [place] FDA and has been authorized for detection and/or diagnosis of SARS-CoV-2 by FDA under an Emergency Use Authorization (EUA). This EUA will remain in effect (meaning this test can be used) for the duration of the COVID-19 declaration under Section 564(b)(1) of the Act, 21 U.S.C. section 360bbb-3(b)(1), unless the authorization is terminated or revoked.     Resp Syncytial Virus by PCR NEGATIVE NEGATIVE Final    Comment: (NOTE) Fact Sheet for Patients: BloggerCourse.com  Fact Sheet for Healthcare Providers: SeriousBroker.it  This test is not yet approved or cleared by the [place] FDA and has been authorized for detection and/or diagnosis of SARS-CoV-2 by FDA under an Emergency Use Authorization (EUA). This EUA will remain in effect (meaning this test can be used) for the  duration of the COVID-19 declaration under Section 564(b)(1) of the Act, 21 U.S.C. section 360bbb-3(b)(1), unless the authorization is terminated or revoked.  Performed at [facility], [address], [place], [place] [address]        Radiology Studies: No results found.    LOS: 2 days    Jacquelin Hawking, MD [facility] 09/20/2022, 12:09 PM   If 7PM-7AM, please contact night-coverage [email or web address]

## 2022-09-20 NOTE — Progress Notes (Signed)
CRITICAL VALUE STICKER  CRITICAL VALUE: Potassium 2.3  DATE & TIME NOTIFIED: 09/20/22 @ 1908  MESSENGER (representative from lab): Alyse  MD NOTIFIED: Dr. Bridgett Larsson   TIME OF NOTIFICATION: 1910  RESPONSE:  Awaiting response

## 2022-09-20 NOTE — Progress Notes (Signed)
CRITICAL VALUE STICKER  CRITICAL VALUE: K+ 2.2  RECEIVER (on-site recipient of call): Jeneen Rinks RN   [place] NOTIFIED: 09/20/22 @ 1313  MESSENGER (representative from lab): unknown  MD NOTIFIED: Nettey. MD  TIME OF NOTIFICATION: 1318  RESPONSE:  Awaiting response

## 2022-09-21 DIAGNOSIS — I480 Paroxysmal atrial fibrillation: Secondary | ICD-10-CM | POA: Diagnosis not present

## 2022-09-21 DIAGNOSIS — E039 Hypothyroidism, unspecified: Secondary | ICD-10-CM | POA: Diagnosis not present

## 2022-09-21 DIAGNOSIS — E876 Hypokalemia: Secondary | ICD-10-CM | POA: Diagnosis not present

## 2022-09-21 DIAGNOSIS — N179 Acute kidney failure, unspecified: Secondary | ICD-10-CM | POA: Diagnosis not present

## 2022-09-21 LAB — GLUCOSE, CAPILLARY
Glucose-Capillary: 170 mg/dL — ABNORMAL HIGH (ref 70–99)
Glucose-Capillary: 135 mg/dL — ABNORMAL HIGH (ref 70–99)
Glucose-Capillary: 183 mg/dL — ABNORMAL HIGH (ref 70–99)
Glucose-Capillary: 133 mg/dL — ABNORMAL HIGH (ref 70–99)

## 2022-09-21 LAB — BASIC METABOLIC PANEL
Anion gap: 13 (ref 5–15)
Anion gap: 12 (ref 5–15)
BUN: 13 mg/dL (ref 8–23)
BUN: 11 mg/dL (ref 8–23)
CO2: 23 mmol/L (ref 22–32)
CO2: 23 mmol/L (ref 22–32)
Calcium: 7.9 mg/dL — ABNORMAL LOW (ref 8.9–10.3)
Calcium: 8.1 mg/dL — ABNORMAL LOW (ref 8.9–10.3)
Chloride: 91 mmol/L — ABNORMAL LOW (ref 98–111)
Chloride: 93 mmol/L — ABNORMAL LOW (ref 98–111)
Creatinine, Ser: 1.81 mg/dL — ABNORMAL HIGH (ref 0.61–1.24)
Creatinine, Ser: 1.8 mg/dL — ABNORMAL HIGH (ref 0.61–1.24)
GFR, Estimated: 37 mL/min — ABNORMAL LOW (ref >60)
GFR, Estimated: 38 mL/min — ABNORMAL LOW (ref >60)
Glucose, Bld: 134 mg/dL — ABNORMAL HIGH (ref 70–99)
Glucose, Bld: 184 mg/dL — ABNORMAL HIGH (ref 70–99)
Potassium: 2.5 mmol/L — CL (ref 3.5–5.1)
Sodium: 127 mmol/L — ABNORMAL LOW (ref 135–145)
Sodium: 128 mmol/L — ABNORMAL LOW (ref 135–145)

## 2022-09-21 LAB — POTASSIUM
Potassium: 2.6 mmol/L — CL (ref 3.5–5.1)
Potassium: 2.3 mmol/L — CL (ref 3.5–5.1)

## 2022-09-21 LAB — MAGNESIUM: Magnesium: 1.8 mg/dL (ref 1.7–2.4)

## 2022-09-21 MED ORDER — POTASSIUM CHLORIDE CRYS ER 20 MEQ PO TBCR
40.0000 meq | EXTENDED_RELEASE_TABLET | ORAL | Status: AC
  Administered 2022-09-21 (×2): 40 meq via ORAL
  Filled 2022-09-21 (×2): qty 2

## 2022-09-21 MED ORDER — POTASSIUM CHLORIDE CRYS ER 20 MEQ PO TBCR
40.0000 meq | EXTENDED_RELEASE_TABLET | Freq: Once | ORAL | Status: AC
  Administered 2022-09-21: 40 meq via ORAL
  Filled 2022-09-21: qty 2

## 2022-09-21 MED ORDER — POTASSIUM CHLORIDE 10 MEQ/100ML IV SOLN
10.0000 meq | INTRAVENOUS | Status: AC
  Administered 2022-09-21 – 2022-09-22 (×3): 10 meq via INTRAVENOUS
  Filled 2022-09-21 (×3): qty 100

## 2022-09-21 NOTE — Evaluation (Signed)
Occupational Therapy Evaluation Patient Details Name: Albert Powell MRN: [id number] DOB: 1941/05/06 Today's Date: 09/21/2022   History of Present Illness Albert Powell is a 82 y.o. male who presented secondary to vomiting and syncopal episode, found to have evidence of AKI with possible infectious gastroenteritis. with a history of hypertension, hyperlipidemia, paroxysmal atrial fibrillation, HFpER, CAD s/p PCI, diabetes mellitus type 2, CKD stage IIIb, hypothyroidism, dementia.   Clinical Impression   Pt presenting with problem above with deficits listed below. Upon eval, pt presents with generalized weakness, decreased activity tolerance, decreased safety, awareness, and insight. Pt performing STS transfers from low bed surface with light mod A and min guard A from elevated surface. Pt performing LB ADL with up to min A and UB ADL with set-up. Pending progress and consistent transfers without physical A, recommending discharge home with HHOT to optimize safety and independence in ADL and IADL.      Recommendations for follow up therapy are one component of a multi-disciplinary discharge planning process, led by the attending physician.  Recommendations may be updated based on patient status, additional functional criteria and insurance authorization.   Follow Up Recommendations  Home health OT     Assistance Recommended at Discharge Intermittent Supervision/Assistance  Patient can return home with the following A little help with walking and/or transfers;A lot of help with bathing/dressing/bathroom;Assistance with cooking/housework    Functional Status Assessment  Patient has had a recent decline in their functional status and demonstrates the ability to make significant improvements in function in a reasonable and predictable amount of time.  Equipment Recommendations  None recommended by OT    Recommendations for Other Services       Precautions / Restrictions  Precautions Precautions: Fall Restrictions Weight Bearing Restrictions: No      Mobility Bed Mobility Overal bed mobility: Needs Assistance Bed Mobility: Supine to Sit     Supine to sit: Min assist          Transfers Overall transfer level: Needs assistance Equipment used: Rolling walker (2 wheels) Transfers: Sit to/from Stand Sit to Stand: Mod assist           General transfer comment: Mod assist to power up from low bed      Balance Overall balance assessment: Needs assistance Sitting-balance support: No upper extremity supported, Feet supported Sitting balance-Leahy Scale: Good     Standing balance support: Bilateral upper extremity supported, Reliant on assistive device for balance Standing balance-Leahy Scale: Poor Standing balance comment: heavily reliant on RW                           ADL either performed or assessed with clinical judgement   ADL Overall ADL's : Needs assistance/impaired Eating/Feeding: Independent;Sitting   Grooming: Set up;Sitting   Upper Body Bathing: Set up;Supervision/ safety;Sitting   Lower Body Bathing: Minimal assistance;Sitting/lateral leans;Sit to/from stand   Upper Body Dressing : Set up;Sitting   Lower Body Dressing: Min guard;Minimal assistance;Sitting/lateral leans;Sit to/from stand   Toilet Transfer: Moderate assistance;Ambulation;Comfort height toilet;Rolling walker (2 wheels) Toilet Transfer Details (indicate cue type and reason): Mod A to boost up from low surface         Functional mobility during ADLs: Min guard;Rolling walker (2 wheels)       Vision Baseline Vision/History: 0 No visual deficits Ability to See in Adequate Light: 0 Adequate Patient Visual Report: No change from baseline Vision Assessment?: No apparent visual deficits  Perception     Praxis      Pertinent Vitals/Pain Pain Assessment Pain Assessment: No/denies pain Pain Intervention(s): Monitored during session      Hand Dominance Right   Extremity/Trunk Assessment Upper Extremity Assessment Upper Extremity Assessment: Generalized weakness   Lower Extremity Assessment Lower Extremity Assessment: Defer to PT evaluation   Cervical / Trunk Assessment Cervical / Trunk Assessment: Kyphotic   Communication Communication Communication: No difficulties   Cognition Arousal/Alertness: Awake/alert Behavior During Therapy: Flat affect Overall Cognitive Status: No family/caregiver present to determine baseline cognitive functioning                                 General Comments: Pt with difficulty answering PLOF questions, reporting he has not walked in two years, then saying he uses rollator for short distances. Pt following commands throughout session. Will continue to assess.     General Comments  VSS    Exercises     Shoulder Instructions      Home Living Family/patient expects to be discharged to:: Private residence Living Arrangements: Spouse/significant other Available Help at Discharge: Family;Available 24 hours/day Type of Home: Apartment Home Access: Level entry     Home Layout: One level     Bathroom Shower/Tub: Tub/shower unit;Curtain   Bathroom Toilet: Standard Bathroom Accessibility: Yes   Home Equipment: Rollator (4 wheels);Shower seat;Cane - single point   Additional Comments: reports wife has dementia, but she does the driving and is fairly indep at home      Prior Functioning/Environment Prior Level of Function : Independent/Modified Independent             Mobility Comments: reports mod indep ambulating with rollator; limited distances due to "lower legs give out" without warning, has been happening randomly the past year, typically able to get up from fall, has chairs set around house to keep distance walked short; reports "haven't been able to walk for two years" ADLs Comments: reports indep with ADL/iADLs; does not drive; wife does  majority of cooking        OT Problem List: Decreased strength;Impaired balance (sitting and/or standing);Decreased activity tolerance;Decreased knowledge of use of DME or AE;Decreased cognition;Decreased safety awareness      OT Treatment/Interventions: Self-care/ADL training;Therapeutic exercise;Energy conservation;DME and/or AE instruction;Therapeutic activities;Patient/family education;Balance training    OT Goals(Current goals can be found in the care plan section) Acute Rehab OT Goals Patient Stated Goal: go home OT Goal Formulation: With patient Time For Goal Achievement: 10/05/22 Potential to Achieve Goals: Good  OT Frequency: Min 2X/week    Co-evaluation              AM-PAC OT "6 Clicks" Daily Activity     Outcome Measure Help from another person eating meals?: None Help from another person taking care of personal grooming?: A Little Help from another person toileting, which includes using toliet, bedpan, or urinal?: A Lot Help from another person bathing (including washing, rinsing, drying)?: A Lot Help from another person to put on and taking off regular upper body clothing?: A Little Help from another person to put on and taking off regular lower body clothing?: A Lot 6 Click Score: 16   End of Session Equipment Utilized During Treatment: Gait belt;Rolling walker (2 wheels) Nurse Communication: Mobility status  Activity Tolerance: Patient tolerated treatment well Patient left: with call bell/phone within reach;with chair alarm set;in chair  OT Visit Diagnosis: Unsteadiness on feet (R26.81);Other abnormalities of  gait and mobility (R26.89);Muscle weakness (generalized) (M62.81)                Time: 8003-4917 OT Time Calculation (min): 31 min Charges:  OT General Charges $OT Visit: 1 Visit OT Evaluation $OT Eval Moderate Complexity: 1 Mod  Elder Cyphers, OTR/L [facility] Acute Rehabilitation Office: [phone]   Magnus Ivan 09/21/2022, 5:27 PM

## 2022-09-21 NOTE — Progress Notes (Signed)
PROGRESS NOTE    Albert Powell  HAL:[id number] DOB: 1941/01/30 DOA: 09/17/2022 PCP: Patient, No Pcp Per   Brief Narrative: Albert Powell is a 82 y.o. male with a history of hypertension, hyperlipidemia, paroxysmal atrial fibrillation, HFpER, CAD s/p PCI, diabetes mellitus type 2, CKD stage IIIb, hypothyroidism, dementia. Patient presented secondary to vomiting and syncopal episode, found to have evidence of AKI with possible infectious gastroenteritis. Supportive care with IV fluids.   Assessment and Plan:  AKI on CKD stage IIIb Baseline creatinine is about 1.8-2. Creatinine of 3.18 on admission. Patient started on IV fluids. Creatinine with continued steady improvement. -Continue IV fluids -BMP daily  Nausea/vomiting/diarrhea Concern for possible infectious etiology. Diarrhea improved. Continued dry heaves. GI pathogen panel pending. -Continue Zofran  and phenergan PRN  Acute metabolic encephalopathy Likely secondary to dehydration. Patient appears to be fully oriented fully at this time.  CAD Noted. No chest pain. -Continue Plavix and Lipitor  Myocardial injury Mild troponin elevation of 71 > 60, not consistent with ACS. No chest pain.  Leukocytosis WBC of 16,100 on admission. Secondary to acute illness. Trending down.  Hypokalemia Worsened secondary to diuresis, however, patient has a history of hypokalemia. Potassium of 2.3 this morning -Potassium supplementation -Repeat potassium this afternoon -Hold torsemide  Paroxysmal atrial fibrillation Noted. Currently in sinus rhythm. -Continue amiodarone and Eliquis  Chronic heart failure with recovered EF Transthoracic Echocardiogram from 08/2022 significant for LVEF of 55-60% with indeterminate diastolic parameters.  Hyponatremia Chronic issues since one month prior. Mild. Stable.  Polycythemia Transient, likely secondary to dehydration on admission. Resolved.  Diabetes mellitus type 2 Most recent  hemoglobin A1C of 7.2% which, for age, indicates well controlled diabetes..  Hypothyroidism -Continue Synthroid  Multiple renal cysts Noted on CT abdomen/pelvis and read as likely benign renal cysts, not requiring dedicated follow-up.   DVT prophylaxis: Eliquis Code Status:   Code Status: Full Code Family Communication: None at bedside Disposition Plan: Discharge pending improvement of nausea/vomiting, stability of electrolytes and PT/OT recommendations   Consultants:  None  Procedures:  None  Antimicrobials: None    Subjective: Patient reports improvement in emesis. No abdominal pain. No diarrhea.  Objective: BP (!) 160/72 (BP Location: Right Arm)   Pulse 93   Temp 97.6 F (36.4 C) (Oral)   Resp 18   Ht 5\' 9"  (1.753 m)   Wt 85.7 kg   SpO2 98%   BMI 27.90 kg/m   Examination:  General exam: Appears calm and comfortable. Laying in bed. Respiratory system: Clear to auscultation. Respiratory effort normal. Cardiovascular system: S1 & S2 heard, RRR. Gastrointestinal system: Abdomen is nondistended, soft and nontender. No organomegaly or masses felt. Normal bowel sounds heard. Central nervous system: Alert and oriented. No focal neurological deficits. Musculoskeletal: No edema. No calf tenderness Skin: No cyanosis. No rashes Psychiatry: Judgement and insight appear normal. Mood & affect appropriate.    Data Reviewed: I have personally reviewed following labs and imaging studies  CBC Lab Results  Component Value Date   WBC 10.8 (H) 09/20/2022   RBC 5.05 09/20/2022   HGB 14.9 09/20/2022   HCT 41.9 09/20/2022   MCV 83.0 09/20/2022   MCH 29.5 09/20/2022   PLT 281 09/20/2022   MCHC 35.6 09/20/2022   RDW 14.6 09/20/2022   LYMPHSABS 0.9 08/28/2022   MONOABS 0.6 08/28/2022   EOSABS 0.0 08/28/2022   BASOSABS 0.0 08/28/2022     Last metabolic panel Lab Results  Component Value Date   NA 127 (L) 09/21/2022  K  09/21/2022    SPECIMEN HEMOLYZED. HEMOLYSIS  MAY AFFECT INTEGRITY OF RESULTS.   CL 91 (L) 09/21/2022   CO2 23 09/21/2022   BUN 13 09/21/2022   CREATININE 1.81 (H) 09/21/2022   GLUCOSE 134 (H) 09/21/2022   GFRNONAA 37 (L) 09/21/2022   GFRAA 52 (L) 08/17/2020   CALCIUM 7.9 (L) 09/21/2022   PHOS 2.7 05/14/2022   PROT 6.2 (L) 09/04/2022   ALBUMIN 3.5 09/04/2022   BILITOT 1.1 09/04/2022   ALKPHOS 76 09/04/2022   AST 23 09/04/2022   ALT 23 09/04/2022   ANIONGAP 13 09/21/2022    GFR: Estimated Creatinine Clearance: 35.3 mL/min (A) (by C-G formula based on SCr of 1.81 mg/dL (H)).  Recent Results (from the past 240 hour(s))  Resp panel by RT-PCR (RSV, Flu A&B, Covid) Anterior Nasal Swab     Status: None   Collection Time: 09/18/22  7:47 AM   Specimen: Anterior Nasal Swab  Result Value Ref Range Status   SARS Coronavirus 2 by RT PCR NEGATIVE NEGATIVE Final    Comment: (NOTE) SARS-CoV-2 target nucleic acids are NOT DETECTED.  The SARS-CoV-2 RNA is generally detectable in upper respiratory specimens during the acute phase of infection. The lowest concentration of SARS-CoV-2 viral copies this assay can detect is 138 copies/mL. A negative result does not preclude SARS-Cov-2 infection and should not be used as the sole basis for treatment or other patient management decisions. A negative result may occur with  improper specimen collection/handling, submission of specimen other than nasopharyngeal swab, presence of viral mutation(s) within the areas targeted by this assay, and inadequate number of viral copies(<138 copies/mL). A negative result must be combined with clinical observations, patient history, and epidemiological information. The expected result is Negative.  Fact Sheet for Patients:  EntrepreneurPulse.com.au  Fact Sheet for Healthcare Providers:  IncredibleEmployment.be  This test is no t yet approved or cleared by the [place] FDA and  has been authorized for detection  and/or diagnosis of SARS-CoV-2 by FDA under an Emergency Use Authorization (EUA). This EUA will remain  in effect (meaning this test can be used) for the duration of the COVID-19 declaration under Section 564(b)(1) of the Act, 21 U.S.C.section 360bbb-3(b)(1), unless the authorization is terminated  or revoked sooner.       Influenza A by PCR NEGATIVE NEGATIVE Final   Influenza B by PCR NEGATIVE NEGATIVE Final    Comment: (NOTE) The Xpert Xpress SARS-CoV-2/FLU/RSV plus assay is intended as an aid in the diagnosis of influenza from Nasopharyngeal swab specimens and should not be used as a sole basis for treatment. Nasal washings and aspirates are unacceptable for Xpert Xpress SARS-CoV-2/FLU/RSV testing.  Fact Sheet for Patients: EntrepreneurPulse.com.au  Fact Sheet for Healthcare Providers: IncredibleEmployment.be  This test is not yet approved or cleared by the [place] FDA and has been authorized for detection and/or diagnosis of SARS-CoV-2 by FDA under an Emergency Use Authorization (EUA). This EUA will remain in effect (meaning this test can be used) for the duration of the COVID-19 declaration under Section 564(b)(1) of the Act, 21 U.S.C. section 360bbb-3(b)(1), unless the authorization is terminated or revoked.     Resp Syncytial Virus by PCR NEGATIVE NEGATIVE Final    Comment: (NOTE) Fact Sheet for Patients: EntrepreneurPulse.com.au  Fact Sheet for Healthcare Providers: IncredibleEmployment.be  This test is not yet approved or cleared by the [place] FDA and has been authorized for detection and/or diagnosis of SARS-CoV-2 by FDA under an Emergency Use Authorization (EUA). This  EUA will remain in effect (meaning this test can be used) for the duration of the COVID-19 declaration under Section 564(b)(1) of the Act, 21 U.S.C. section 360bbb-3(b)(1), unless the authorization is terminated  or revoked.  Performed at [facility], [address], [place], [place] [address]       Radiology Studies: No results found.    LOS: 3 days    Jacquelin Hawking, MD [facility] 09/21/2022, 7:52 AM   If 7PM-7AM, please contact night-coverage [email or web address]

## 2022-09-21 NOTE — Evaluation (Signed)
Physical Therapy Evaluation Patient Details Name: Albert Powell MRN: [id number] DOB: 04/14/1941 Today's Date: 09/21/2022  History of Present Illness  Albert Powell is a 82 y.o. male who presented secondary to vomiting and syncopal episode, found to have evidence of AKI with possible infectious gastroenteritis. with a history of hypertension, hyperlipidemia, paroxysmal atrial fibrillation, HFpER, CAD s/p PCI, diabetes mellitus type 2, CKD stage IIIb, hypothyroidism, dementia.  Clinical Impression   Pt admitted with above diagnosis. Lives at home with his wife, in a single-level home with a level entry; Prior to admission, pt was able to walk short distances with rollator RW; Presents to PT with generalized weakness, decr activity tolerance, decr standing tolerance;  Currently needs min assist to sit up to EOB, mod assist to stand, and min assist to walk very short, less than 10 ft, distance; Pt currently with functional limitations due to the deficits listed below (see PT Problem List). Pt will benefit from skilled PT to increase their independence and safety with mobility to allow discharge to the venue listed below.          Recommendations for follow up therapy are one component of a multi-disciplinary discharge planning process, led by the attending physician.  Recommendations may be updated based on patient status, additional functional criteria and insurance authorization.  Follow Up Recommendations Home health PT      Assistance Recommended at Discharge Intermittent Supervision/Assistance  Patient can return home with the following  A little help with walking and/or transfers;A little help with bathing/dressing/bathroom;Assistance with cooking/housework;Assist for transportation;Help with stairs or ramp for entrance    Equipment Recommendations Wheelchair (measurements PT);Wheelchair cushion (measurements PT)  Recommendations for Other Services       Functional Status Assessment  Patient has had a recent decline in their functional status and demonstrates the ability to make significant improvements in function in a reasonable and predictable amount of time.     Precautions / Restrictions Precautions Precautions: Fall Restrictions Weight Bearing Restrictions: No      Mobility  Bed Mobility Overal bed mobility: Needs Assistance Bed Mobility: Supine to Sit     Supine to sit: Min assist          Transfers Overall transfer level: Needs assistance Equipment used: Rolling walker (2 wheels) Transfers: Sit to/from Stand Sit to Stand: Mod assist           General transfer comment: Mod assist to power up from low bed    Ambulation/Gait Ambulation/Gait assistance: Min assist, +2 safety/equipment (second person helpful for safety in small cramped room) Gait Distance (Feet): 12 Feet (around bed) Assistive device: Rolling walker (2 wheels) Gait Pattern/deviations: Decreased step length - right, Decreased step length - left, Trunk flexed Gait velocity: varied     General Gait Details: close gurad for safety; pt impulsive maneuvering around objects in his room; as he got closer to the recliner, RW drifted too far in front of pt; fatigued at end of short bout of amb, and decr cotrol of descent to sit  Stairs            Wheelchair Mobility    Modified Rankin (Stroke Patients Only)       Balance               Standing balance comment: heavily reliant on RW; tolerated static standing less than 30 seconds  Pertinent Vitals/Pain Pain Assessment Pain Assessment: No/denies pain    Home Living Family/patient expects to be discharged to:: Private residence Living Arrangements: Spouse/significant other Available Help at Discharge: Family;Available 24 hours/day Type of Home: Apartment Home Access: Level entry       Home Layout: One level Home Equipment: Rollator (4 wheels);Shower seat;Cane - single  point Additional Comments: reports wife has dementia, but she does the driving and is fairly indep at home    Prior Function Prior Level of Function : Independent/Modified Independent             Mobility Comments: reports mod indep ambulating with rollator; limited distances due to "lower legs give out" without warning, has been happening randomly the past year, typically able to get up from fall, has chairs set around house to keep distance walked short; reports "haven't been able to walk for two years" ADLs Comments: reports indep with ADL/iADLs; does not drive; wife does majority of cooking     Hand Dominance   Dominant Hand: Right    Extremity/Trunk Assessment   Upper Extremity Assessment Upper Extremity Assessment: Generalized weakness    Lower Extremity Assessment Lower Extremity Assessment: Generalized weakness (Notably decreased muscle endurance, light touch diminished bil feet)    Cervical / Trunk Assessment Cervical / Trunk Assessment: Kyphotic  Communication   Communication: No difficulties  Cognition Arousal/Alertness: Awake/alert Behavior During Therapy: Flat affect Overall Cognitive Status: No family/caregiver present to determine baseline cognitive functioning                                          General Comments General comments (skin integrity, edema, etc.): VSS    Exercises     Assessment/Plan    PT Assessment Patient needs continued PT services  PT Problem List Decreased activity tolerance;Decreased balance;Decreased mobility;Decreased knowledge of use of DME       PT Treatment Interventions DME instruction;Gait training;Functional mobility training;Therapeutic activities;Therapeutic exercise;Balance training;Patient/family education;Wheelchair mobility training    PT Goals (Current goals can be found in the Care Plan section)  Acute Rehab PT Goals Patient Stated Goal: return home PT Goal Formulation: With patient Time  For Goal Achievement: 10/05/22 Potential to Achieve Goals: Fair    Frequency Min 3X/week     Co-evaluation               AM-PAC PT "6 Clicks" Mobility  Outcome Measure Help needed turning from your back to your side while in a flat bed without using bedrails?: None Help needed moving from lying on your back to sitting on the side of a flat bed without using bedrails?: A Little Help needed moving to and from a bed to a chair (including a wheelchair)?: A Little Help needed standing up from a chair using your arms (e.g., wheelchair or bedside chair)?: A Lot Help needed to walk in hospital room?: A Lot Help needed climbing 3-5 steps with a railing? : A Lot 6 Click Score: 16    End of Session Equipment Utilized During Treatment: Gait belt Activity Tolerance: Other (comment) (Limited by decr standing tolerance) Patient left: in chair;with call bell/phone within reach;with chair alarm set Nurse Communication: Mobility status PT Visit Diagnosis: Other abnormalities of gait and mobility (R26.89);Repeated falls (R29.6)    Time: 1610-9604 PT Time Calculation (min) (ACUTE ONLY): 35 min   Charges:   PT Evaluation $PT Eval Moderate Complexity: 1 Mod  Van Clines, PT  Acute Rehabilitation Services Office [phone]   Levi Aland 09/21/2022, 6:43 PM

## 2022-09-22 DIAGNOSIS — E039 Hypothyroidism, unspecified: Secondary | ICD-10-CM | POA: Diagnosis not present

## 2022-09-22 DIAGNOSIS — I480 Paroxysmal atrial fibrillation: Secondary | ICD-10-CM | POA: Diagnosis not present

## 2022-09-22 DIAGNOSIS — N179 Acute kidney failure, unspecified: Secondary | ICD-10-CM | POA: Diagnosis not present

## 2022-09-22 DIAGNOSIS — E876 Hypokalemia: Secondary | ICD-10-CM | POA: Diagnosis not present

## 2022-09-22 LAB — SODIUM: Sodium: 131 mmol/L — ABNORMAL LOW (ref 135–145)

## 2022-09-22 LAB — BASIC METABOLIC PANEL
Anion gap: 11 (ref 5–15)
BUN: 9 mg/dL (ref 8–23)
CO2: 24 mmol/L (ref 22–32)
Calcium: 8.1 mg/dL — ABNORMAL LOW (ref 8.9–10.3)
Chloride: 93 mmol/L — ABNORMAL LOW (ref 98–111)
Creatinine, Ser: 1.8 mg/dL — ABNORMAL HIGH (ref 0.61–1.24)
GFR, Estimated: 38 mL/min — ABNORMAL LOW (ref >60)
Glucose, Bld: 146 mg/dL — ABNORMAL HIGH (ref 70–99)
Potassium: 3.5 mmol/L (ref 3.5–5.1)
Sodium: 128 mmol/L — ABNORMAL LOW (ref 135–145)

## 2022-09-22 LAB — GLUCOSE, CAPILLARY
Glucose-Capillary: 142 mg/dL — ABNORMAL HIGH (ref 70–99)
Glucose-Capillary: 219 mg/dL — ABNORMAL HIGH (ref 70–99)
Glucose-Capillary: 155 mg/dL — ABNORMAL HIGH (ref 70–99)
Glucose-Capillary: 152 mg/dL — ABNORMAL HIGH (ref 70–99)

## 2022-09-22 LAB — NA AND K (SODIUM & POTASSIUM), RAND UR
Potassium Urine: 14 mmol/L
Sodium, Ur: 18 mmol/L

## 2022-09-22 LAB — CREATININE, URINE, RANDOM: Creatinine, Urine: 19 mg/dL

## 2022-09-22 LAB — OSMOLALITY, URINE: Osmolality, Ur: 126 mOsm/kg — ABNORMAL LOW (ref 300–900)

## 2022-09-22 LAB — POTASSIUM: Potassium: 3.2 mmol/L — ABNORMAL LOW (ref 3.5–5.1)

## 2022-09-22 IMAGING — DX DG CHEST 1V PORT
1 series · 1 of 1 positions shown · non-contrast
Comparison: 03/30/2020

CLINICAL DATA: Short of breath and dizziness

EXAM:
PORTABLE CHEST 1 VIEW

[chest]
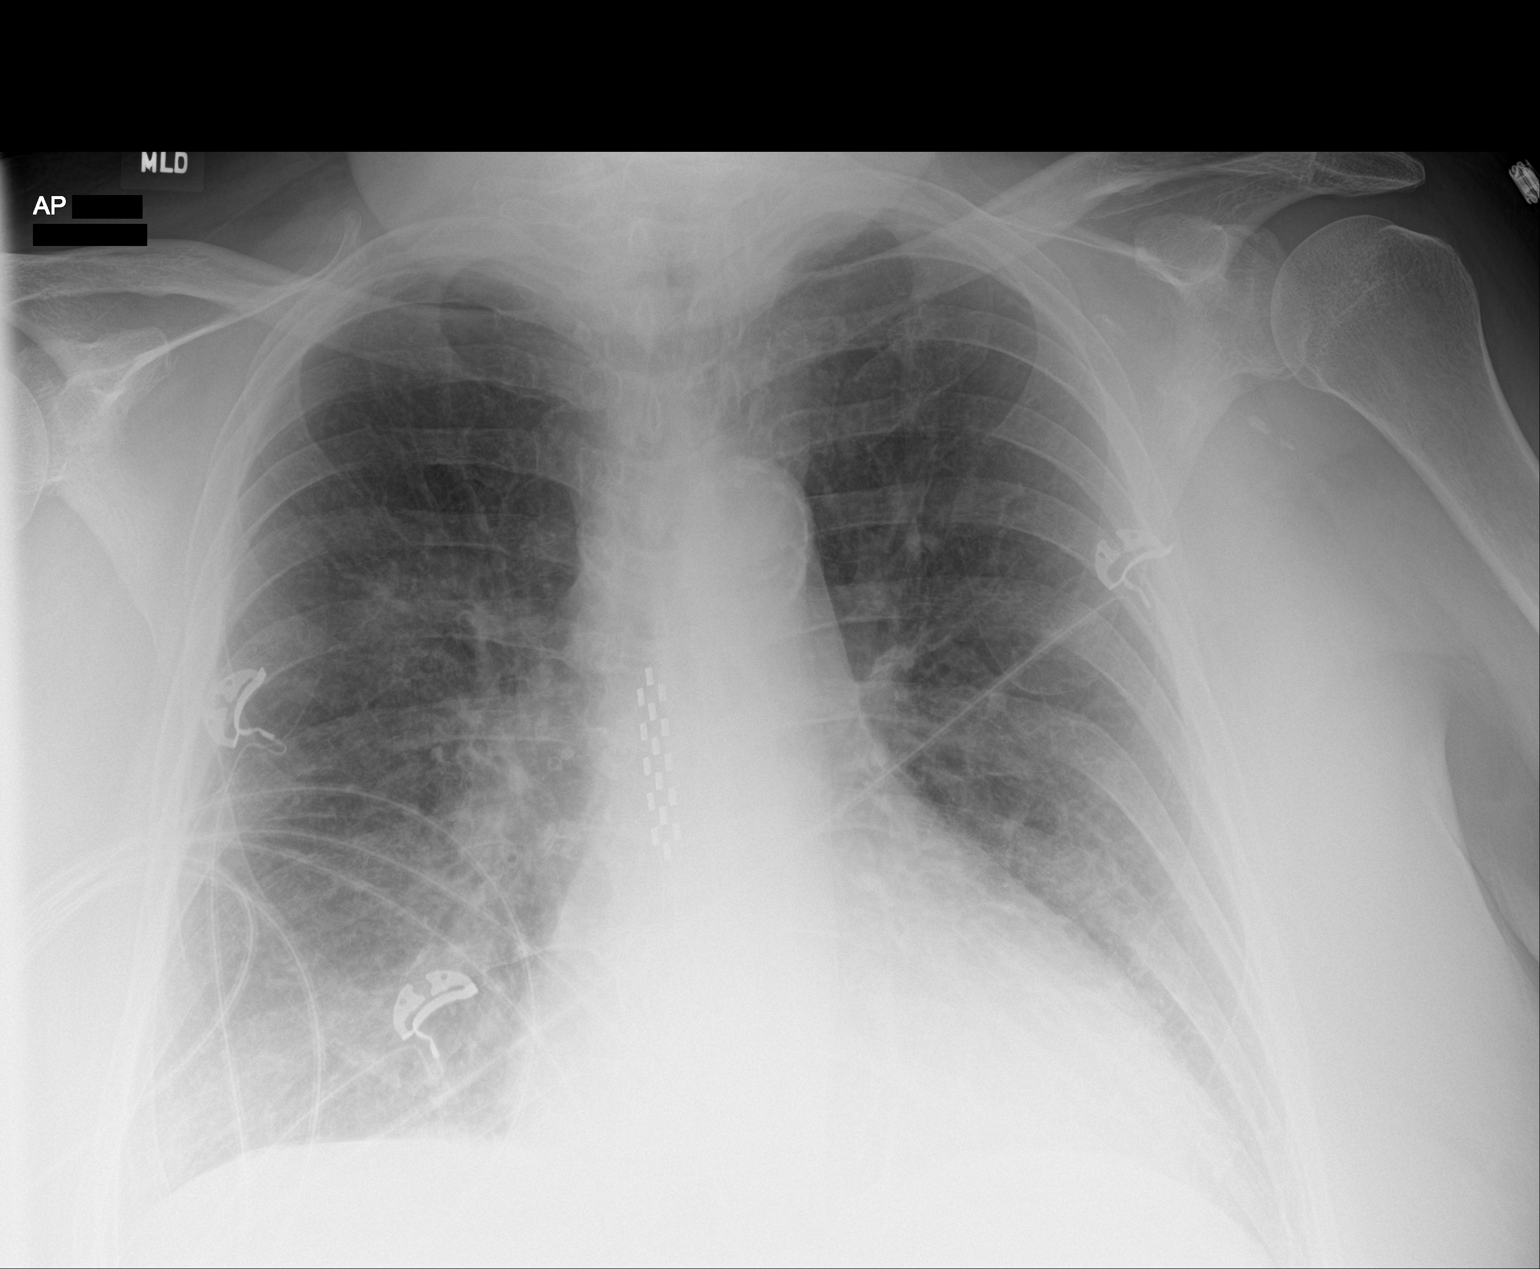

[1 of 1 positions shown; findings below may reference images not displayed]

FINDINGS: Cardiac enlargement with mild vascular congestion. Perihilar and
bibasilar airspace disease has developed since the prior study. No
effusion.

Atherosclerotic calcification aortic arch. Spinal cord stimulator
midthoracic spine unchanged.
IMPRESSION: Cardiac enlargement with perihilar and bilateral airspace disease
suggestive of interstitial edema. Pneumonia also in the
differential.

## 2022-09-22 MED ORDER — GLUCERNA SHAKE PO LIQD
237.0000 mL | Freq: Two times a day (BID) | ORAL | Status: DC
  Administered 2022-09-23 – 2022-09-25 (×6): 237 mL via ORAL

## 2022-09-22 MED ORDER — ADULT MULTIVITAMIN W/MINERALS CH
1.0000 | ORAL_TABLET | Freq: Every day | ORAL | Status: DC
  Administered 2022-09-22 – 2022-09-25 (×4): 1 via ORAL
  Filled 2022-09-22 (×4): qty 1

## 2022-09-22 NOTE — TOC Initial Note (Addendum)
Transition of Care [facility]) - Initial/Assessment Note    Patient Details  Name: Albert Powell MRN: [id number] Date of Birth: July 23, 1941  [facility] CM/SW Contact:    Tom-Johnson, Renea Ee, RN Phone Number: 09/22/2022, 4:11 PM  Clinical Narrative:                  CM spoke with patient at bedside about needs for post hospital transition. Admitted for AKI on CKD. Creatinine today at 1.80. Patient from home with spouse. Has one supportive daughter. States he does not drive, wife drives him to and from his appointments. Has a walker and shower seat at home.  Does not have a PCP, requests to schedule hospital f/u and new patient establishment with Internal Medicine, info on AVS.  Home health PT/OT recommended, patient is currently active with [facility] and patient would like to continue with their services. Resumption of care order placed. Caryl Pina notified and acceptance voiced, info on AVS.  CM will continue to follow as patient progresses with care towards discharge.       Expected Discharge Plan: [place] Barriers to Discharge: Continued Medical Work up   Patient Goals and CMS Choice Patient states their goals for this hospitalization and ongoing recovery are:: To return home CMS Medicare.gov Compare Post Acute Care list provided to:: Patient Choice offered to / list presented to : Patient      Expected Discharge Plan and Services   Discharge Planning Services: CM Consult Post Acute Care Choice: [place] arrangements for the past 2 months: Apartment                           HH Arranged: PT, OT [place] Agency: [place] ([facility]) Date HH Agency Contacted: 09/22/22 Time [place]: 13 Representative spoke with at [place]: [place] Arrangements/Services Living arrangements for the past 2 months: [place] with:: Spouse Patient language and need for interpreter reviewed:: Yes Do  you feel safe going back to the place where you live?: Yes      Need for Family Participation in Patient Care: Yes (Comment) Care giver support system in place?: Yes (comment)   Criminal Activity/Legal Involvement Pertinent to Current Situation/Hospitalization: No - Comment as needed  Activities of Daily Living Home Assistive Devices/Equipment: Shower chair with back, Grab bars in shower, Hand-held shower hose, Walker (specify type) (4 wheel walker) ADL Screening (condition at time of admission) Patient's cognitive ability adequate to safely complete daily activities?: Yes Is the patient deaf or have difficulty hearing?: No Does the patient have difficulty seeing, even when wearing glasses/contacts?: No Does the patient have difficulty concentrating, remembering, or making decisions?: No Patient able to express need for assistance with ADLs?: Yes Does the patient have difficulty dressing or bathing?: Yes Independently performs ADLs?: No Communication: Independent Dressing (OT): Needs assistance Is this a change from baseline?: Pre-admission baseline Grooming: Independent Feeding: Independent Bathing: Needs assistance Is this a change from baseline?: Pre-admission baseline Toileting: Independent In/Out Bed: Independent Walks in Home: Independent with device (comment) (4 wheel walker) Does the patient have difficulty walking or climbing stairs?: Yes Weakness of Legs: Both Weakness of Arms/Hands: None  Permission Sought/Granted Permission sought to share information with : Case Manager, Customer service manager, Family Supports Permission granted to share information with : Yes, Verbal Permission Granted              Emotional Assessment Appearance::  Appears stated age Attitude/Demeanor/Rapport: Gracious, Engaged Affect (typically observed): Accepting, Appropriate, Calm, Hopeful, Pleasant Orientation: : Oriented to Self, Oriented to Place, Oriented to  Time, Oriented to  Situation Alcohol / Substance Use: Not Applicable Psych Involvement: No (comment)  Admission diagnosis:  Hypokalemia [E87.6] AKI (acute kidney injury) ([name]) [N17.9] Acute kidney injury superimposed on chronic kidney disease ([name]) [N17.9, N18.9] Nausea and vomiting, unspecified vomiting type [R11.2] Patient Active Problem List   Diagnosis Date Noted   Nausea and vomiting 09/18/2022   Heart failure with preserved ejection fraction ([name]) 09/18/2022   AKI (acute kidney injury) ([name]) 09/06/2022   Chronic combined systolic and diastolic CHF (congestive heart failure) ([name]) 09/04/2022   Near syncope 09/04/2022   Hypokalemia 06/23/2022   Elevated troponin 06/23/2022   Type 2 diabetes mellitus with hyperlipidemia ([name]) 05/19/2022   Acute combined systolic and diastolic heart failure ([name])    Acute kidney injury superimposed on chronic kidney disease ([name]) 05/13/2022   GERD (gastroesophageal reflux disease) 05/13/2022   Acquired hypothyroidism 05/13/2022   Paroxysmal atrial fibrillation ([name]) 10/08/2021   Acute metabolic encephalopathy    CHF (congestive heart failure) ([name]) 03/10/2021   Essential hypertension 03/10/2021   Hypertensive crisis 01/01/2021   ACS (acute coronary syndrome) ([name])    Lesion of right native kidney    Atrial fibrillation ([name]) 03/30/2020   Contusion of right hand 11/12/2018   Primary osteoarthritis of both first carpometacarpal joints 11/12/2018   Pain in joint of right shoulder 10/05/2018   Pain in right hand 10/05/2018   Hematoma 03/30/2018   Degeneration of lumbar intervertebral disc 12/21/2017   Lumbar radiculopathy 12/21/2017   Vertigo 04/09/2017   Hypogonadism in male 12/07/2014   Screening for prostate cancer 12/07/2014   Claudication of right lower extremity ([name]) 10/20/2013   Claudication in peripheral vascular disease- Rt leg 09/20/2013   Obesity (BMI 30-39.9)- negative sleep study in the past 09/20/2013   Occlusion and stenosis of carotid artery  without mention of cerebral infarction 05/23/2013   PAD (peripheral artery disease) ([name]) 05/18/2013   Carotid artery disease ([name]) 04/14/2013   Bradycardia, sinus 02/18/2013   Tobacco abuse 02/05/2013   Chest pain, unstable angina, negative MI 02/04/2013   Coronary artery disease involving native coronary artery of native heart without angina pectoris 02/04/2013   Benign essential hypertension 07/10/2011   Incisional hernia 05/22/2011   PCP:  Patient, No Pcp Per Pharmacy:   [facility] - [place], [place] - [address] [place] [address] Phone: [phone] Fax: [phone]     Social Determinants of Health (SDOH) Social History: SDOH Screenings   Food Insecurity: No Food Insecurity (09/18/2022)  Housing: Low Risk  (09/18/2022)  Transportation Needs: No Transportation Needs (09/18/2022)  Recent Concern: Transportation Needs - Unmet Transportation Needs (08/21/2022)  Utilities: Not At Risk (09/18/2022)  Alcohol Screen: Low Risk  (06/03/2022)  Depression (PHQ2-9): High Risk (08/14/2022)  Financial Resource Strain: Low Risk  (06/03/2022)  Tobacco Use: Medium Risk (09/18/2022)   SDOH Interventions:     Readmission Risk Interventions     No data to display

## 2022-09-22 NOTE — Progress Notes (Signed)
Physical Therapy Treatment Patient Details Name: Albert Powell MRN: [id number] DOB: 11/17/1940 Today's Date: 09/22/2022   History of Present Illness AWS SHERE is a 82 y.o. male who presented secondary to vomiting and syncopal episode, found to have evidence of AKI with possible infectious gastroenteritis. with a history of hypertension, hyperlipidemia, paroxysmal atrial fibrillation, HFpER, CAD s/p PCI, diabetes mellitus type 2, CKD stage IIIb, hypothyroidism, dementia.    PT Comments    Pt is slowly progressing. Demonstrates slight improvement from initial evaluation but continues reporting weakness in the legs though demonstrates less weakness in the bil LE than pt is reporting. Pt is impulsive and has decreased safety awareness. 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 Pt is performing functional mobility at a level appropriate for a referral to HHPT but there are concerns for current cognitive status and pt safety at home due to requires cueing for safe hand placement for sit to stand and encouragement to continue with walk when pt states that his legs are weak and "giving out". No signs of tremor or buckling when walking when pt reports this phenomena. Currently recommending skilled physical therapy services in HHPT setting on discharge from acute care hospital setting in order to decrease risk for falls, injury and re-hospitalization.    Recommendations for follow up therapy are one component of a multi-disciplinary discharge planning process, led by the attending physician.  Recommendations may be updated based on patient status, additional functional criteria and insurance authorization.  Follow Up Recommendations  Home health PT     Assistance Recommended at Discharge Intermittent Supervision/Assistance  Patient can return home with the following A little help with walking and/or transfers;Assistance with  cooking/housework;Assist for transportation;Help with stairs or ramp for entrance   Equipment Recommendations  None recommended by PT    Recommendations for Other Services       Precautions / Restrictions Precautions Precautions: Fall Restrictions Weight Bearing Restrictions: No     Mobility  Bed Mobility Overal bed mobility: Modified Independent Bed Mobility: Supine to Sit, Sit to Supine     Supine to sit: Modified independent (Device/Increase time) Sit to supine: Modified independent (Device/Increase time)   General bed mobility comments: no difficulty with bed mobility Patient Response: Flat affect  Transfers Overall transfer level: Needs assistance Equipment used: Rolling walker (2 wheels) Transfers: Sit to/from Stand Sit to Stand: Supervision           General transfer comment: pt requires verbal cues for safe hand placement and sequencing with sit to stand.    Ambulation/Gait Ambulation/Gait assistance: Supervision Gait Distance (Feet): 15 Feet Assistive device: Rolling walker (2 wheels) Gait Pattern/deviations: Decreased step length - right, Decreased step length - left, Trunk flexed Gait velocity: Decreased cadence. Gait velocity interpretation: <1.31 ft/sec, indicative of household ambulator   General Gait Details: Pt fatigues quickly. Initially did 4 ft fwd/back then sat to rest. Then performed 15 ft with encouragement.       Balance Overall balance assessment: Mild deficits observed, not formally tested Sitting-balance support: No upper extremity supported, Feet supported Sitting balance-Leahy Scale: Good     Standing balance support: Bilateral upper extremity supported Standing balance-Leahy Scale: Fair Standing balance comment: pt is able to stand without heavy reliance on AD this session.          Cognition Arousal/Alertness: Awake/alert Behavior During Therapy: Flat affect Overall Cognitive Status: No family/caregiver present to  determine baseline cognitive functioning  General Comments: Pt states that his spouse has dementia and he cares for her. He reports he uses a rollator at home. Pt following commands throughout session. Will continue to assess.           General Comments General comments (skin integrity, edema, etc.): Pt was questioned on his plan for caring for his spouse with dementia at home and how far he has to ambulate to get to various rooms in his house. Pt does not seem to have a plan. Functionally he is moving at a level for HHPT but concerns for cognitive decline and safety.      Pertinent Vitals/Pain Pain Assessment Pain Assessment: No/denies pain     PT Goals (current goals can now be found in the care plan section) Acute Rehab PT Goals Patient Stated Goal: return home PT Goal Formulation: With patient Time For Goal Achievement: 10/05/22 Potential to Achieve Goals: Fair Progress towards PT goals: Progressing toward goals    Frequency    Min 3X/week      PT Plan Current plan remains appropriate       AM-PAC PT "6 Clicks" Mobility   Outcome Measure  Help needed turning from your back to your side while in a flat bed without using bedrails?: None Help needed moving from lying on your back to sitting on the side of a flat bed without using bedrails?: None Help needed moving to and from a bed to a chair (including a wheelchair)?: A Little Help needed standing up from a chair using your arms (e.g., wheelchair or bedside chair)?: A Little Help needed to walk in hospital room?: A Little Help needed climbing 3-5 steps with a railing? : A Lot 6 Click Score: 19    End of Session Equipment Utilized During Treatment: Gait belt Activity Tolerance: Other (comment);Patient limited by fatigue (Pt will state his legs are getting weak and he needs to sit right away, no tremors or buckling noted. Pt is possibly demonstrating a fear of falling.) Patient left: in bed;with call  bell/phone within reach;with bed alarm set Nurse Communication: Mobility status PT Visit Diagnosis: Other abnormalities of gait and mobility (R26.89);Repeated falls (R29.6)     Time: 1025-8527 PT Time Calculation (min) (ACUTE ONLY): 12 min  Charges:  $Therapeutic Activity: 8-22 mins                     Tomma Rakers, DPT, CLT  Acute Rehabilitation Services Office: [phone] (Secure chat preferred)    Ander Purpura 09/22/2022, 4:12 PM

## 2022-09-22 NOTE — Progress Notes (Signed)
Initial Nutrition Assessment  DOCUMENTATION CODES:   Not applicable  INTERVENTION:  - Add Glucerna Shake po BID, each supplement provides 220 kcal and 10 grams of protein  - Add MVI q day  NUTRITION DIAGNOSIS:   Inadequate oral intake related to poor appetite as evidenced by meal completion < 25%.  GOAL:   Patient will meet greater than or equal to 90% of their needs  MONITOR:   PO intake  REASON FOR ASSESSMENT:   Malnutrition Screening Tool    ASSESSMENT:   82 y.o. male admits related to emesis. PMH includes: HTN, HLD, PAF, HFpEF, CAD, T2DM, CKD stage 3, hypothyroidism, mild dementia. Pt is currently receiving medical management for AKI superimposed on CKD.  Meds reviewed:  lipitor, sliding scale insulin. Labs reviewed: Na low. Blood sugars fairly controlled.   The pt reports that he has been eating "so-so." Pt also reports that he was eating normal PTA. He denies any wt changes. Per record, the pt has eaten 100% of meals today and yesterday. However, several intakes since admission have been documented at 0%. RD will add Glucerna BID for now. Will continue to monitor PO intakes.   NUTRITION - FOCUSED PHYSICAL EXAM:  RD unable to complete assessment at this time. Pt kept falling asleep during assessment. Will attempt at f/u.   Diet Order:   Diet Order             DIET SOFT Room service appropriate? Yes; Fluid consistency: Thin  Diet effective now                   EDUCATION NEEDS:   Not appropriate for education at this time  Skin:  Skin Assessment: Reviewed RN Assessment  Last BM:  09/19/22  Height:   Ht Readings from Last 1 Encounters:  09/18/22 5\' 9"  (1.753 m)    Weight:   Wt Readings from Last 1 Encounters:  09/22/22 85.9 kg    Ideal Body Weight:     BMI:  Body mass index is 27.97 kg/m.  Estimated Nutritional Needs:   Kcal:  2505-3976 kcals  Protein:  105-125 gm  Fluid:  >/= 2.1 L  Thalia Bloodgood, RD, LDN, CNSC.

## 2022-09-22 NOTE — Progress Notes (Signed)
PROGRESS NOTE    Albert Powell  [id number] DOB: 09/01/1941 DOA: 09/17/2022 PCP: Patient, No Pcp Per   Brief Narrative: Albert Powell is a 82 y.o. male with a history of hypertension, hyperlipidemia, paroxysmal atrial fibrillation, HFpER, CAD s/p PCI, diabetes mellitus type 2, CKD stage IIIb, hypothyroidism, dementia. Patient presented secondary to vomiting and syncopal episode, found to have evidence of AKI with possible infectious gastroenteritis. Supportive care with IV fluids.   Assessment and Plan:  AKI on CKD stage IIIb Baseline creatinine is about 1.8-2. Creatinine of 3.18 on admission. Patient started on IV fluids. Creatinine back to baseline. AKI resolved. -Discontinue IV fluids  Nausea/vomiting/diarrhea Concern for possible infectious etiology. Diarrhea improved. Nausea and vomiting improved -Continue Zofran  and phenergan PRN -Advance to soft diet  Acute metabolic encephalopathy Likely secondary to dehydration. Patient appears to be fully oriented fully at this time.  CAD Noted. No chest pain. -Continue Plavix and Lipitor  Myocardial injury Mild troponin elevation of 71 > 60, not consistent with ACS. No chest pain.  Leukocytosis WBC of 16,100 on admission. Secondary to acute illness. Trending down.  Hypokalemia Worsened secondary to diuresis, however, patient has a history of hypokalemia. Potassium of 3.5 this morning -Repeat potassium this afternoon -Hold torsemide  Paroxysmal atrial fibrillation Noted. Currently in sinus rhythm. -Continue amiodarone and Eliquis  Chronic heart failure with recovered EF Transthoracic Echocardiogram from 08/2022 significant for LVEF of 55-60% with indeterminate diastolic parameters.  Hyponatremia Chronic issues since one month prior. Worsened slightly. But stable. -Repeat sodium this afternoon  Polycythemia Transient, likely secondary to dehydration on admission. Resolved.  Diabetes mellitus type 2 Most  recent hemoglobin A1C of 7.2% which, for age, indicates well controlled diabetes..  Hypothyroidism -Continue Synthroid  Multiple renal cysts Noted on CT abdomen/pelvis and read as likely benign renal cysts, not requiring dedicated follow-up.   DVT prophylaxis: Eliquis Code Status:   Code Status: Full Code Family Communication: None at bedside Disposition Plan: Discharge home with home health likely in 1 day if sodium improved and patient is tolerating a soft diet   Consultants:  None  Procedures:  None  Antimicrobials: None    Subjective: Nausea and vomiting improved. No concerns today.  Objective: BP (!) 161/76   Pulse 78   Temp 97.6 F (36.4 C) (Oral)   Resp 16   Ht 5\' 9"  (1.753 m)   Wt 85.9 kg   SpO2 99%   BMI 27.97 kg/m   Examination:  General exam: Appears calm and comfortable Respiratory system: Clear to auscultation. Respiratory effort normal. Cardiovascular system: S1 & S2 heard, RRR. Gastrointestinal system: Abdomen is nondistended, soft and nontender. Normal bowel sounds heard. Central nervous system: Alert and oriented. Musculoskeletal: No edema. No calf tenderness Skin: No cyanosis. No rashes Psychiatry: Judgement and insight appear normal. Mood & affect appropriate.    Data Reviewed: I have personally reviewed following labs and imaging studies  CBC Lab Results  Component Value Date   WBC 10.8 (H) 09/20/2022   RBC 5.05 09/20/2022   HGB 14.9 09/20/2022   HCT 41.9 09/20/2022   MCV 83.0 09/20/2022   MCH 29.5 09/20/2022   PLT 281 09/20/2022   MCHC 35.6 09/20/2022   RDW 14.6 09/20/2022   LYMPHSABS 0.9 08/28/2022   MONOABS 0.6 08/28/2022   EOSABS 0.0 08/28/2022   BASOSABS 0.0 [id number]     Last metabolic panel Lab Results  Component Value Date   NA 128 (L) 09/22/2022   K 3.5 09/22/2022  CL 93 (L) 09/22/2022   CO2 24 09/22/2022   BUN 9 09/22/2022   CREATININE 1.80 (H) 09/22/2022   GLUCOSE 146 (H) 09/22/2022   GFRNONAA 38 (L)  09/22/2022   GFRAA 52 (L) 08/17/2020   CALCIUM 8.1 (L) 09/22/2022   PHOS 2.7 05/14/2022   PROT 6.2 (L) 09/04/2022   ALBUMIN 3.5 09/04/2022   BILITOT 1.1 09/04/2022   ALKPHOS 76 09/04/2022   AST 23 09/04/2022   ALT 23 09/04/2022   ANIONGAP 11 09/22/2022    GFR: Estimated Creatinine Clearance: 35.6 mL/min (A) (by C-G formula based on SCr of 1.8 mg/dL (H)).  Recent Results (from the past 240 hour(s))  Resp panel by RT-PCR (RSV, Flu A&B, Covid) Anterior Nasal Swab     Status: None   Collection Time: 09/18/22  7:47 AM   Specimen: Anterior Nasal Swab  Result Value Ref Range Status   SARS Coronavirus 2 by RT PCR NEGATIVE NEGATIVE Final    Comment: (NOTE) SARS-CoV-2 target nucleic acids are NOT DETECTED.  The SARS-CoV-2 RNA is generally detectable in upper respiratory specimens during the acute phase of infection. The lowest concentration of SARS-CoV-2 viral copies this assay can detect is 138 copies/mL. A negative result does not preclude SARS-Cov-2 infection and should not be used as the sole basis for treatment or other patient management decisions. A negative result may occur with  improper specimen collection/handling, submission of specimen other than nasopharyngeal swab, presence of viral mutation(s) within the areas targeted by this assay, and inadequate number of viral copies(<138 copies/mL). A negative result must be combined with clinical observations, patient history, and epidemiological information. The expected result is Negative.  Fact Sheet for Patients:  BloggerCourse.com  Fact Sheet for Healthcare Providers:  SeriousBroker.it  This test is no t yet approved or cleared by the [place] FDA and  has been authorized for detection and/or diagnosis of SARS-CoV-2 by FDA under an Emergency Use Authorization (EUA). This EUA will remain  in effect (meaning this test can be used) for the duration of the COVID-19  declaration under Section 564(b)(1) of the Act, 21 U.S.C.section 360bbb-3(b)(1), unless the authorization is terminated  or revoked sooner.       Influenza A by PCR NEGATIVE NEGATIVE Final   Influenza B by PCR NEGATIVE NEGATIVE Final    Comment: (NOTE) The Xpert Xpress SARS-CoV-2/FLU/RSV plus assay is intended as an aid in the diagnosis of influenza from Nasopharyngeal swab specimens and should not be used as a sole basis for treatment. Nasal washings and aspirates are unacceptable for Xpert Xpress SARS-CoV-2/FLU/RSV testing.  Fact Sheet for Patients: BloggerCourse.com  Fact Sheet for Healthcare Providers: SeriousBroker.it  This test is not yet approved or cleared by the [place] FDA and has been authorized for detection and/or diagnosis of SARS-CoV-2 by FDA under an Emergency Use Authorization (EUA). This EUA will remain in effect (meaning this test can be used) for the duration of the COVID-19 declaration under Section 564(b)(1) of the Act, 21 U.S.C. section 360bbb-3(b)(1), unless the authorization is terminated or revoked.     Resp Syncytial Virus by PCR NEGATIVE NEGATIVE Final    Comment: (NOTE) Fact Sheet for Patients: BloggerCourse.com  Fact Sheet for Healthcare Providers: SeriousBroker.it  This test is not yet approved or cleared by the [place] FDA and has been authorized for detection and/or diagnosis of SARS-CoV-2 by FDA under an Emergency Use Authorization (EUA). This EUA will remain in effect (meaning this test can be used) for the duration of the  COVID-19 declaration under Section 564(b)(1) of the Act, 21 U.S.C. section 360bbb-3(b)(1), unless the authorization is terminated or revoked.  Performed at [facility], [place] [address], [place], [place] [address]       Radiology Studies: No results found.    LOS: 4 days    Cordelia Poche,  MD [facility] 09/22/2022, 12:06 PM   If 7PM-7AM, please contact night-coverage [email or web address]

## 2022-09-23 DIAGNOSIS — E039 Hypothyroidism, unspecified: Secondary | ICD-10-CM | POA: Diagnosis not present

## 2022-09-23 DIAGNOSIS — E876 Hypokalemia: Secondary | ICD-10-CM | POA: Diagnosis not present

## 2022-09-23 DIAGNOSIS — N179 Acute kidney failure, unspecified: Secondary | ICD-10-CM | POA: Diagnosis not present

## 2022-09-23 DIAGNOSIS — I480 Paroxysmal atrial fibrillation: Secondary | ICD-10-CM | POA: Diagnosis not present

## 2022-09-23 LAB — MAGNESIUM: Magnesium: 1.8 mg/dL (ref 1.7–2.4)

## 2022-09-23 LAB — GLUCOSE, CAPILLARY
Glucose-Capillary: 143 mg/dL — ABNORMAL HIGH (ref 70–99)
Glucose-Capillary: 175 mg/dL — ABNORMAL HIGH (ref 70–99)
Glucose-Capillary: 213 mg/dL — ABNORMAL HIGH (ref 70–99)
Glucose-Capillary: 197 mg/dL — ABNORMAL HIGH (ref 70–99)

## 2022-09-23 LAB — POTASSIUM: Potassium: 2.9 mmol/L — ABNORMAL LOW (ref 3.5–5.1)

## 2022-09-23 MED ORDER — MAGNESIUM SULFATE 2 GM/50ML IV SOLN
2.0000 g | Freq: Once | INTRAVENOUS | Status: AC
  Administered 2022-09-23: 2 g via INTRAVENOUS
  Filled 2022-09-23: qty 50

## 2022-09-23 MED ORDER — POTASSIUM CHLORIDE CRYS ER 20 MEQ PO TBCR
40.0000 meq | EXTENDED_RELEASE_TABLET | ORAL | Status: AC
  Administered 2022-09-23 (×2): 40 meq via ORAL
  Filled 2022-09-23 (×2): qty 2

## 2022-09-23 NOTE — Progress Notes (Signed)
Occupational Therapy Treatment Patient Details Name: Albert Powell MRN: [id number] DOB: 29-Dec-1940 Today's Date: 09/23/2022   History of present illness AMADEO COKE is a 82 y.o. male who presented secondary to vomiting and syncopal episode, found to have evidence of AKI with possible infectious gastroenteritis. with a history of hypertension, hyperlipidemia, paroxysmal atrial fibrillation, HFpER, CAD s/p PCI, diabetes mellitus type 2, CKD stage IIIb, hypothyroidism, dementia.   OT comments  Patient seated on EOB upon entry completing breakfast. Patient required min assist to power up on this date from EOB and chair with cues for hand placement. Patient ambulated to sink for grooming but was unable to tolerate standing to perform and completed tasks with increased time seated. Patient originally agreed to sit up in recliner but at end of session asked to return to supine. Patient would benefit from further OT services to increase activity tolerance and independence with self care tasks.    Recommendations for follow up therapy are one component of a multi-disciplinary discharge planning process, led by the attending physician.  Recommendations may be updated based on patient status, additional functional criteria and insurance authorization.    Follow Up Recommendations  Home health OT     Assistance Recommended at Discharge Intermittent Supervision/Assistance  Patient can return home with the following  A little help with walking and/or transfers;A lot of help with bathing/dressing/bathroom;Assistance with cooking/housework   Equipment Recommendations  None recommended by OT    Recommendations for Other Services      Precautions / Restrictions Precautions Precautions: Fall Restrictions Weight Bearing Restrictions: No       Mobility Bed Mobility Overal bed mobility: Modified Independent Bed Mobility: Sit to Supine       Sit to supine: Modified independent  (Device/Increase time)   General bed mobility comments: seated on EOB upon entry and able to return to supine without assistance    Transfers Overall transfer level: Needs assistance Equipment used: Rolling walker (2 wheels) Transfers: Sit to/from Stand Sit to Stand: Min assist           General transfer comment: cues for hand placement and min assist to power up     Balance Overall balance assessment: Mild deficits observed, not formally tested Sitting-balance support: No upper extremity supported, Feet supported Sitting balance-Leahy Scale: Good     Standing balance support: Bilateral upper extremity supported Standing balance-Leahy Scale: Fair Standing balance comment: unable to tolerate long standing                           ADL either performed or assessed with clinical judgement   ADL Overall ADL's : Needs assistance/impaired Eating/Feeding: Independent;Sitting   Grooming: Set up;Sitting Grooming Details (indicate cue type and reason): unable to tolerate performing in standing Upper Body Bathing: Set up;Supervision/ safety;Sitting                             General ADL Comments: limited by weakness and activity tolerance    Extremity/Trunk Assessment              Vision       Perception     Praxis      Cognition Arousal/Alertness: Awake/alert Behavior During Therapy: Flat affect Overall Cognitive Status: No family/caregiver present to determine baseline cognitive functioning  Exercises      Shoulder Instructions       General Comments      Pertinent Vitals/ Pain       Pain Assessment Pain Assessment: No/denies pain Pain Intervention(s): Monitored during session  Home Living                                          Prior Functioning/Environment              Frequency  Min 2X/week        Progress Toward Goals  OT  Goals(current goals can now be found in the care plan section)  Progress towards OT goals: Progressing toward goals  Acute Rehab OT Goals Patient Stated Goal: go home OT Goal Formulation: With patient Time For Goal Achievement: 10/05/22 Potential to Achieve Goals: Good ADL Goals Pt Will Perform Grooming: with modified independence;standing Pt Will Perform Lower Body Bathing: with supervision;with adaptive equipment;sitting/lateral leans;sit to/from stand Pt Will Perform Upper Body Dressing: with modified independence;sitting Pt Will Perform Lower Body Dressing: sit to/from stand;with modified independence Pt Will Transfer to Toilet: with modified independence;ambulating Pt Will Perform Toileting - Clothing Manipulation and hygiene: with supervision;with adaptive equipment;sitting/lateral leans;sit to/from stand Additional ADL Goal #1: Pt will increase activity tolerance to perform 2+ consecutive ADLs in standing  Plan Discharge plan remains appropriate    Co-evaluation                 AM-PAC OT "6 Clicks" Daily Activity     Outcome Measure   Help from another person eating meals?: None Help from another person taking care of personal grooming?: A Little Help from another person toileting, which includes using toliet, bedpan, or urinal?: A Lot Help from another person bathing (including washing, rinsing, drying)?: A Lot Help from another person to put on and taking off regular upper body clothing?: A Little Help from another person to put on and taking off regular lower body clothing?: A Lot 6 Click Score: 16    End of Session Equipment Utilized During Treatment: Gait belt;Rolling walker (2 wheels)  OT Visit Diagnosis: Unsteadiness on feet (R26.81);Other abnormalities of gait and mobility (R26.89);Muscle weakness (generalized) (M62.81)   Activity Tolerance Patient limited by fatigue   Patient Left in bed;with call bell/phone within reach;with bed alarm set   Nurse  Communication Mobility status        Time: 1443-1540 OT Time Calculation (min): 24 min  Charges: OT General Charges $OT Visit: 1 Visit OT Treatments $Self Care/Home Management : 23-37 mins  Alfonse Flavors, OTA Acute Rehabilitation Services  Office [phone]   Dewain Penning 09/23/2022, 10:12 AM

## 2022-09-23 NOTE — Progress Notes (Signed)
PROGRESS NOTE    Albert Powell  HWE:[id number] DOB: 01/30/1941 DOA: 09/17/2022 PCP: Patient, No Pcp Per   Brief Narrative: Albert Powell is a 82 y.o. male with a history of hypertension, hyperlipidemia, paroxysmal atrial fibrillation, HFpER, CAD s/p PCI, diabetes mellitus type 2, CKD stage IIIb, hypothyroidism, dementia. Patient presented secondary to vomiting and syncopal episode, found to have evidence of AKI with possible infectious gastroenteritis. Supportive care with IV fluids.   Assessment and Plan:  AKI on CKD stage IIIb Baseline creatinine is about 1.8-2. Creatinine of 3.18 on admission. Patient started on IV fluids. Creatinine back to baseline. AKI resolved.  Nausea/vomiting/diarrhea Concern for possible infectious etiology. Diarrhea improved. Nausea and vomiting resolved -Continue Zofran  and phenergan PRN -Continue soft diet -If recurrent issues in the future, patient may benefit from outpatient GI follow-up  Acute metabolic encephalopathy Likely secondary to dehydration. Patient appears to be fully oriented fully at this time.  CAD Noted. No chest pain. -Continue Plavix and Lipitor  Myocardial injury Mild troponin elevation of 71 > 60, not consistent with ACS. No chest pain.  Leukocytosis WBC of 16,100 on admission. Secondary to acute illness. Trended down.  Hypokalemia Worsened secondary to diuresis, however, patient has a history of hypokalemia. Potassium of 2.9 this morning -Potassium supplementation -Hold torsemide  Paroxysmal atrial fibrillation Noted. Currently in sinus rhythm. -Continue amiodarone and Eliquis  Chronic heart failure with recovered EF Transthoracic Echocardiogram from 08/2022 significant for LVEF of 55-60% with indeterminate diastolic parameters.  Hyponatremia Chronic issues since one month prior. Worsened to a low of 128 with improvement after holding IV fluids and torsemide -BMP in AM  Polycythemia Transient, likely  secondary to dehydration on admission. Resolved.  Diabetes mellitus type 2 Most recent hemoglobin A1C of 7.2% which, for age, indicates well controlled diabetes..  Hypothyroidism -Continue Synthroid  Multiple renal cysts Noted on CT abdomen/pelvis and read as likely benign renal cysts, not requiring dedicated follow-up.   DVT prophylaxis: Eliquis Code Status:   Code Status: Full Code Family Communication: None at bedside Disposition Plan: Discharge home with home health tomorrow if potasium is stable   Consultants:  None  Procedures:  None  Antimicrobials: None    Subjective: Tolerating diet. No issues overnight.  Objective: BP (!) 160/72 (BP Location: Right Arm)   Pulse 76   Temp 98 F (36.7 C)   Resp 18   Ht 5\' 9"  (1.753 m)   Wt 85.9 kg   SpO2 95%   BMI 27.97 kg/m   Examination:  General exam: Appears calm and comfortable Respiratory system: Clear to auscultation. Respiratory effort normal. Cardiovascular system: S1 & S2 heard, Normal rate with regular rhythm. Gastrointestinal system: Abdomen is nondistended, soft and nontender. Normal bowel sounds heard. Central nervous system: Alert and oriented. No focal neurological deficits. Musculoskeletal: No calf tenderness Skin: No cyanosis. No rashes Psychiatry: Judgement and insight appear normal. Mood & affect appropriate.    Data Reviewed: I have personally reviewed following labs and imaging studies  CBC Lab Results  Component Value Date   WBC 10.8 (H) 09/20/2022   RBC 5.05 09/20/2022   HGB 14.9 09/20/2022   HCT 41.9 09/20/2022   MCV 83.0 09/20/2022   MCH 29.5 09/20/2022   PLT 281 09/20/2022   MCHC 35.6 09/20/2022   RDW 14.6 09/20/2022   LYMPHSABS 0.9 08/28/2022   MONOABS 0.6 08/28/2022   EOSABS 0.0 08/28/2022   BASOSABS 0.0 89/38/1017     Last metabolic panel Lab Results  Component Value  Date   NA 131 (L) 09/22/2022   K 2.9 (L) 09/23/2022   CL 93 (L) 09/22/2022   CO2 24 09/22/2022    BUN 9 09/22/2022   CREATININE 1.80 (H) 09/22/2022   GLUCOSE 146 (H) 09/22/2022   GFRNONAA 38 (L) 09/22/2022   GFRAA 52 (L) 08/17/2020   CALCIUM 8.1 (L) 09/22/2022   PHOS 2.7 05/14/2022   PROT 6.2 (L) 09/04/2022   ALBUMIN 3.5 09/04/2022   BILITOT 1.1 09/04/2022   ALKPHOS 76 09/04/2022   AST 23 09/04/2022   ALT 23 09/04/2022   ANIONGAP 11 09/22/2022    GFR: Estimated Creatinine Clearance: 35.6 mL/min (A) (by C-G formula based on SCr of 1.8 mg/dL (H)).  Recent Results (from the past 240 hour(s))  Resp panel by RT-PCR (RSV, Flu A&B, Covid) Anterior Nasal Swab     Status: None   Collection Time: 09/18/22  7:47 AM   Specimen: Anterior Nasal Swab  Result Value Ref Range Status   SARS Coronavirus 2 by RT PCR NEGATIVE NEGATIVE Final    Comment: (NOTE) SARS-CoV-2 target nucleic acids are NOT DETECTED.  The SARS-CoV-2 RNA is generally detectable in upper respiratory specimens during the acute phase of infection. The lowest concentration of SARS-CoV-2 viral copies this assay can detect is 138 copies/mL. A negative result does not preclude SARS-Cov-2 infection and should not be used as the sole basis for treatment or other patient management decisions. A negative result may occur with  improper specimen collection/handling, submission of specimen other than nasopharyngeal swab, presence of viral mutation(s) within the areas targeted by this assay, and inadequate number of viral copies(<138 copies/mL). A negative result must be combined with clinical observations, patient history, and epidemiological information. The expected result is Negative.  Fact Sheet for Patients:  BloggerCourse.com  Fact Sheet for Healthcare Providers:  SeriousBroker.it  This test is no t yet approved or cleared by the [place] FDA and  has been authorized for detection and/or diagnosis of SARS-CoV-2 by FDA under an Emergency Use Authorization (EUA).  This EUA will remain  in effect (meaning this test can be used) for the duration of the COVID-19 declaration under Section 564(b)(1) of the Act, 21 U.S.C.section 360bbb-3(b)(1), unless the authorization is terminated  or revoked sooner.       Influenza A by PCR NEGATIVE NEGATIVE Final   Influenza B by PCR NEGATIVE NEGATIVE Final    Comment: (NOTE) The Xpert Xpress SARS-CoV-2/FLU/RSV plus assay is intended as an aid in the diagnosis of influenza from Nasopharyngeal swab specimens and should not be used as a sole basis for treatment. Nasal washings and aspirates are unacceptable for Xpert Xpress SARS-CoV-2/FLU/RSV testing.  Fact Sheet for Patients: BloggerCourse.com  Fact Sheet for Healthcare Providers: SeriousBroker.it  This test is not yet approved or cleared by the [place] FDA and has been authorized for detection and/or diagnosis of SARS-CoV-2 by FDA under an Emergency Use Authorization (EUA). This EUA will remain in effect (meaning this test can be used) for the duration of the COVID-19 declaration under Section 564(b)(1) of the Act, 21 U.S.C. section 360bbb-3(b)(1), unless the authorization is terminated or revoked.     Resp Syncytial Virus by PCR NEGATIVE NEGATIVE Final    Comment: (NOTE) Fact Sheet for Patients: BloggerCourse.com  Fact Sheet for Healthcare Providers: SeriousBroker.it  This test is not yet approved or cleared by the [place] FDA and has been authorized for detection and/or diagnosis of SARS-CoV-2 by FDA under an Emergency Use Authorization (EUA). This EUA  will remain in effect (meaning this test can be used) for the duration of the COVID-19 declaration under Section 564(b)(1) of the Act, 21 U.S.C. section 360bbb-3(b)(1), unless the authorization is terminated or revoked.  Performed at [facility], [address], [place],  [place] [address]       Radiology Studies: No results found.    LOS: 5 days    Jacquelin Hawking, MD [facility] 09/23/2022, 3:08 PM   If 7PM-7AM, please contact night-coverage [email or web address]

## 2022-09-23 NOTE — Progress Notes (Signed)
Mobility Specialist Progress Note:    09/23/22 1033  Mobility  Activity Ambulated with assistance in room  Level of Assistance Contact guard assist, steadying assist  Assistive Device Front wheel walker  Distance Ambulated (ft) 20 ft  Activity Response Tolerated well  Mobility Referral Yes  $Mobility charge 1 Mobility   Pt was agreeable for mobility session. Pt c/o leg weakness. Ambulated around bedside and returned to bed on the other side. Left pt in bed with all needs met.   Royetta Crochet Mobility Specialist Please contact via Solicitor or  Rehab office at [phone]

## 2022-09-23 NOTE — Progress Notes (Signed)
PT Cancellation Note  Patient Details Name: Albert Powell MRN: [id number] DOB: [birth year]   Cancelled Treatment:    Reason Eval/Treat Not Completed: Other (comment)  Pt reports "wiped out" , he worked with OT and mobility earlier.  Will f/u later date. Albert Powell, PT Acute Rehab [facility] [phone]  Karlton Lemon 09/23/2022, 2:10 PM

## 2022-09-24 ENCOUNTER — Telehealth (HOSPITAL_COMMUNITY): Payer: Self-pay | Admitting: Licensed Clinical Social Worker

## 2022-09-24 DIAGNOSIS — I5032 Chronic diastolic (congestive) heart failure: Secondary | ICD-10-CM

## 2022-09-24 DIAGNOSIS — N189 Chronic kidney disease, unspecified: Secondary | ICD-10-CM | POA: Diagnosis not present

## 2022-09-24 DIAGNOSIS — N179 Acute kidney failure, unspecified: Secondary | ICD-10-CM | POA: Diagnosis not present

## 2022-09-24 LAB — BASIC METABOLIC PANEL
Anion gap: 8 (ref 5–15)
Anion gap: 9 (ref 5–15)
BUN: 9 mg/dL (ref 8–23)
BUN: 9 mg/dL (ref 8–23)
CO2: 25 mmol/L (ref 22–32)
CO2: 25 mmol/L (ref 22–32)
Calcium: 8.2 mg/dL — ABNORMAL LOW (ref 8.9–10.3)
Calcium: 7.9 mg/dL — ABNORMAL LOW (ref 8.9–10.3)
Chloride: 95 mmol/L — ABNORMAL LOW (ref 98–111)
Chloride: 95 mmol/L — ABNORMAL LOW (ref 98–111)
Creatinine, Ser: 1.48 mg/dL — ABNORMAL HIGH (ref 0.61–1.24)
Creatinine, Ser: 1.48 mg/dL — ABNORMAL HIGH (ref 0.61–1.24)
GFR, Estimated: 48 mL/min — ABNORMAL LOW (ref >60)
GFR, Estimated: 48 mL/min — ABNORMAL LOW (ref >60)
Glucose, Bld: 145 mg/dL — ABNORMAL HIGH (ref 70–99)
Glucose, Bld: 188 mg/dL — ABNORMAL HIGH (ref 70–99)
Potassium: 3.6 mmol/L (ref 3.5–5.1)
Potassium: 3.5 mmol/L (ref 3.5–5.1)
Sodium: 128 mmol/L — ABNORMAL LOW (ref 135–145)
Sodium: 129 mmol/L — ABNORMAL LOW (ref 135–145)

## 2022-09-24 LAB — GLUCOSE, CAPILLARY
Glucose-Capillary: 174 mg/dL — ABNORMAL HIGH (ref 70–99)
Glucose-Capillary: 159 mg/dL — ABNORMAL HIGH (ref 70–99)
Glucose-Capillary: 143 mg/dL — ABNORMAL HIGH (ref 70–99)
Glucose-Capillary: 212 mg/dL — ABNORMAL HIGH (ref 70–99)

## 2022-09-24 MED ORDER — SODIUM CHLORIDE 0.9 % IV SOLN
INTRAVENOUS | Status: DC
  Administered 2022-09-24: 100 mL/h via INTRAVENOUS

## 2022-09-24 MED ORDER — NYSTATIN 100000 UNIT/GM EX POWD
Freq: Two times a day (BID) | CUTANEOUS | Status: DC
  Filled 2022-09-24: qty 15

## 2022-09-24 MED ORDER — POTASSIUM CHLORIDE CRYS ER 20 MEQ PO TBCR
40.0000 meq | EXTENDED_RELEASE_TABLET | Freq: Once | ORAL | Status: AC
  Administered 2022-09-24: 40 meq via ORAL
  Filled 2022-09-24: qty 2

## 2022-09-24 NOTE — Care Management Important Message (Signed)
Important Message  Patient Details  Name: Albert Powell MRN: [id number] Date of Birth: [birth year]   Medicare Important Message Given:  Yes     [name] 09/24/2022, 1:35 PM

## 2022-09-24 NOTE — Telephone Encounter (Signed)
Patient daughter informed Clinical biochemist that she is interested in talking with someone about potentially pursing placement for the patient and his wife due to home safety concerns.  Pt is enrolled with [facility] so CSW placed order for case management services to reach out and discuss placement process and assist with this as needed.  [name], LCSW Clinical Social Worker [facility] Desk#: [phone] Cell#: [phone]

## 2022-09-24 NOTE — Progress Notes (Signed)
PT Cancellation Note  Patient Details Name: Albert Powell MRN: [id number] DOB: 1941-03-26   Cancelled Treatment:    Reason Eval/Treat Not Completed: Fatigue/lethargy limiting ability to participate, "I'm too tired." Will follow up for PT Treatment as schedule permits.  Mabeline Caras, PT, DPT Acute Rehabilitation Services  Personal: [place] [facility] Office: [place] 09/24/2022, 10:43 AM

## 2022-09-24 NOTE — Progress Notes (Signed)
PROGRESS NOTE    Albert Powell  KKX:[id number] DOB: [birth year] DOA: 09/17/2022 PCP: Patient, No Pcp Per   Brief Narrative: Albert Powell is a 82 y.o. male with a history of hypertension, hyperlipidemia, paroxysmal atrial fibrillation, HFpER, CAD s/p PCI, diabetes mellitus type 2, CKD stage IIIb, hypothyroidism, dementia. Patient presented secondary to vomiting and syncopal episode, found to have evidence of AKI with possible infectious gastroenteritis. Supportive care with IV fluids.   Assessment and Plan:  AKI on CKD stage IIIb Baseline creatinine is about 1.8-2. Creatinine of 3.18 on admission. Patient started on IV fluids. Creatinine back to baseline. AKI resolved.  Nausea/vomiting/diarrhea Concern for possible infectious etiology. Diarrhea improved. Nausea and vomiting resolved -Continue Zofran  and phenergan PRN -Continue soft diet -resolved, tolerating diet.   Acute metabolic encephalopathy Likely secondary to dehydration. Patient appears to be fully oriented fully at this time.  CAD Noted. No chest pain. -Continue Plavix and Lipitor  Myocardial injury Mild troponin elevation of 71 > 60, not consistent with ACS. No chest pain.  Leukocytosis WBC of 16,100 on admission. Secondary to acute illness. Trended down.  Hypokalemia Worsened secondary to diuresis -K 3.5. will give 40 meq times one.  -Hold torsemide  Paroxysmal atrial fibrillation Noted. Currently in sinus rhythm. -Continue amiodarone and Eliquis  Chronic heart failure with recovered EF Transthoracic Echocardiogram from 08/2022 significant for LVEF of 55-60% with indeterminate diastolic parameters.  Hyponatremia Chronic issues since one month prior.  -prior Sodium level 130--131.  Sodium down to 128.  Plan to resume IV fluids.   Polycythemia Transient, likely secondary to dehydration on admission. Resolved.  Diabetes mellitus type 2 Most recent hemoglobin A1C of 7.2% which, for age, indicates  well controlled diabetes..  Hypothyroidism -Continue Synthroid  Multiple renal cysts Noted on CT abdomen/pelvis and read as likely benign renal cysts, not requiring dedicated follow-up.   DVT prophylaxis: Eliquis Code Status:   Code Status: Full Code Family Communication: None at bedside Disposition Plan: Discharge home with home health tomorrow if sodium is better.    Consultants:  None  Procedures:  None  Antimicrobials: None    Subjective: He denies nausea, and or diarrhea.    Objective: BP (!) 180/70 (BP Location: Right Arm)   Pulse 70   Temp 97.9 F (36.6 C) (Oral)   Resp 18   Ht 5\' 9"  (1.753 m)   Wt 85.9 kg   SpO2 100%   BMI 27.97 kg/m   Examination:  General exam: NAD Respiratory system: CTA Cardiovascular system: S 1, S 2 RRR Gastrointestinal system: BS present, soft, nt Central nervous system: alert   Data Reviewed: I have personally reviewed following labs and imaging studies  CBC Lab Results  Component Value Date   WBC 10.8 (H) 09/20/2022   RBC 5.05 09/20/2022   HGB 14.9 09/20/2022   HCT 41.9 09/20/2022   MCV 83.0 09/20/2022   MCH 29.5 09/20/2022   PLT 281 09/20/2022   MCHC 35.6 09/20/2022   RDW 14.6 09/20/2022   LYMPHSABS 0.9 08/28/2022   MONOABS 0.6 08/28/2022   EOSABS 0.0 08/28/2022   BASOSABS 0.0 16/96/7893     Last metabolic panel Lab Results  Component Value Date   NA 129 (L) 09/24/2022   K 3.5 09/24/2022   CL 95 (L) 09/24/2022   CO2 25 09/24/2022   BUN 9 09/24/2022   CREATININE 1.48 (H) 09/24/2022   GLUCOSE 188 (H) 09/24/2022   GFRNONAA 48 (L) 09/24/2022   GFRAA 52 (L) 08/17/2020  CALCIUM 7.9 (L) 09/24/2022   PHOS 2.7 05/14/2022   PROT 6.2 (L) 09/04/2022   ALBUMIN 3.5 09/04/2022   BILITOT 1.1 09/04/2022   ALKPHOS 76 09/04/2022   AST 23 09/04/2022   ALT 23 09/04/2022   ANIONGAP 9 09/24/2022    GFR: Estimated Creatinine Clearance: 43.2 mL/min (A) (by C-G formula based on SCr of 1.48 mg/dL (H)).  Recent  Results (from the past 240 hour(s))  Resp panel by RT-PCR (RSV, Flu A&B, Covid) Anterior Nasal Swab     Status: None   Collection Time: 09/18/22  7:47 AM   Specimen: Anterior Nasal Swab  Result Value Ref Range Status   SARS Coronavirus 2 by RT PCR NEGATIVE NEGATIVE Final    Comment: (NOTE) SARS-CoV-2 target nucleic acids are NOT DETECTED.  The SARS-CoV-2 RNA is generally detectable in upper respiratory specimens during the acute phase of infection. The lowest concentration of SARS-CoV-2 viral copies this assay can detect is 138 copies/mL. A negative result does not preclude SARS-Cov-2 infection and should not be used as the sole basis for treatment or other patient management decisions. A negative result may occur with  improper specimen collection/handling, submission of specimen other than nasopharyngeal swab, presence of viral mutation(s) within the areas targeted by this assay, and inadequate number of viral copies(<138 copies/mL). A negative result must be combined with clinical observations, patient history, and epidemiological information. The expected result is Negative.  Fact Sheet for Patients:  EntrepreneurPulse.com.au  Fact Sheet for Healthcare Providers:  IncredibleEmployment.be  This test is no t yet approved or cleared by the [place] FDA and  has been authorized for detection and/or diagnosis of SARS-CoV-2 by FDA under an Emergency Use Authorization (EUA). This EUA will remain  in effect (meaning this test can be used) for the duration of the COVID-19 declaration under Section 564(b)(1) of the Act, 21 U.S.C.section 360bbb-3(b)(1), unless the authorization is terminated  or revoked sooner.       Influenza A by PCR NEGATIVE NEGATIVE Final   Influenza B by PCR NEGATIVE NEGATIVE Final    Comment: (NOTE) The Xpert Xpress SARS-CoV-2/FLU/RSV plus assay is intended as an aid in the diagnosis of influenza from Nasopharyngeal  swab specimens and should not be used as a sole basis for treatment. Nasal washings and aspirates are unacceptable for Xpert Xpress SARS-CoV-2/FLU/RSV testing.  Fact Sheet for Patients: EntrepreneurPulse.com.au  Fact Sheet for Healthcare Providers: IncredibleEmployment.be  This test is not yet approved or cleared by the [place] FDA and has been authorized for detection and/or diagnosis of SARS-CoV-2 by FDA under an Emergency Use Authorization (EUA). This EUA will remain in effect (meaning this test can be used) for the duration of the COVID-19 declaration under Section 564(b)(1) of the Act, 21 U.S.C. section 360bbb-3(b)(1), unless the authorization is terminated or revoked.     Resp Syncytial Virus by PCR NEGATIVE NEGATIVE Final    Comment: (NOTE) Fact Sheet for Patients: EntrepreneurPulse.com.au  Fact Sheet for Healthcare Providers: IncredibleEmployment.be  This test is not yet approved or cleared by the [place] FDA and has been authorized for detection and/or diagnosis of SARS-CoV-2 by FDA under an Emergency Use Authorization (EUA). This EUA will remain in effect (meaning this test can be used) for the duration of the COVID-19 declaration under Section 564(b)(1) of the Act, 21 U.S.C. section 360bbb-3(b)(1), unless the authorization is terminated or revoked.  Performed at [facility], [place] [address], [place],  [address]       Radiology  Studies: No results found.    LOS: 6 days    Hartley Barefoot, MD [facility] 09/24/2022, 5:25 PM   If 7PM-7AM, please contact night-coverage [email or web address]

## 2022-09-25 ENCOUNTER — Ambulatory Visit: Payer: Self-pay | Admitting: Licensed Clinical Social Worker

## 2022-09-25 ENCOUNTER — Telehealth: Payer: Self-pay | Admitting: *Deleted

## 2022-09-25 ENCOUNTER — Telehealth (HOSPITAL_COMMUNITY): Payer: Self-pay | Admitting: Licensed Clinical Social Worker

## 2022-09-25 ENCOUNTER — Encounter (HOSPITAL_COMMUNITY): Payer: Medicare Other

## 2022-09-25 DIAGNOSIS — N179 Acute kidney failure, unspecified: Secondary | ICD-10-CM | POA: Diagnosis not present

## 2022-09-25 DIAGNOSIS — N189 Chronic kidney disease, unspecified: Secondary | ICD-10-CM | POA: Diagnosis not present

## 2022-09-25 LAB — BASIC METABOLIC PANEL
Anion gap: 10 (ref 5–15)
BUN: 5 mg/dL — ABNORMAL LOW (ref 8–23)
CO2: 23 mmol/L (ref 22–32)
Calcium: 8 mg/dL — ABNORMAL LOW (ref 8.9–10.3)
Chloride: 98 mmol/L (ref 98–111)
Creatinine, Ser: 1.29 mg/dL — ABNORMAL HIGH (ref 0.61–1.24)
GFR, Estimated: 56 mL/min — ABNORMAL LOW (ref >60)
Glucose, Bld: 206 mg/dL — ABNORMAL HIGH (ref 70–99)
Potassium: 3.4 mmol/L — ABNORMAL LOW (ref 3.5–5.1)
Sodium: 131 mmol/L — ABNORMAL LOW (ref 135–145)

## 2022-09-25 LAB — GLUCOSE, CAPILLARY
Glucose-Capillary: 140 mg/dL — ABNORMAL HIGH (ref 70–99)
Glucose-Capillary: 209 mg/dL — ABNORMAL HIGH (ref 70–99)

## 2022-09-25 MED ORDER — HYDRALAZINE HCL 100 MG PO TABS: 100.0000 mg | ORAL_TABLET | Freq: Three times a day (TID) | ORAL | 0 refills | Status: DC

## 2022-09-25 MED ORDER — ADULT MULTIVITAMIN W/MINERALS CH: 1.0000 | ORAL_TABLET | Freq: Every day | ORAL | 0 refills | Status: DC

## 2022-09-25 MED ORDER — POTASSIUM CHLORIDE CRYS ER 20 MEQ PO TBCR
40.0000 meq | EXTENDED_RELEASE_TABLET | Freq: Once | ORAL | Status: AC
  Administered 2022-09-25: 40 meq via ORAL
  Filled 2022-09-25: qty 2

## 2022-09-25 MED ORDER — NYSTATIN 100000 UNIT/GM EX POWD: Freq: Two times a day (BID) | CUTANEOUS | 0 refills | Status: DC

## 2022-09-25 NOTE — Progress Notes (Signed)
  Care Coordination   Note   09/25/2022 Name: DONIVIN WIRT MRN: [id number] DOB: 1940/09/27  WILLOUGHBY DOELL is a 82 y.o. year old male who sees Patient, No Pcp Per for primary care. I reached out to [name] by phone today to offer care coordination services.  Mr. Nocera was given information about Care Coordination services today including:   The Care Coordination services include support from the care team which includes your Nurse Coordinator, Clinical Social Worker, or Pharmacist.  The Care Coordination team is here to help remove barriers to the health concerns and goals most important to you. Care Coordination services are voluntary, and the patient may decline or stop services at any time by request to their care team member.   Care Coordination Consent Status: Patient agreed to services and verbal consent obtained.   Follow up plan:  Telephone appointment with care coordination team member scheduled for:  09/25/2022  Encounter Outcome:  Pt. Scheduled from referral   Julian Hy, [place] Direct Dial: [phone]

## 2022-09-25 NOTE — TOC Transition Note (Signed)
Transition of Care [facility]) - CM/SW Discharge Note   Patient Details  Name: Albert Powell MRN: [id number] Date of Birth: [birth year]  [facility] CM/SW Contact:  Tom-Johnson, Renea Ee, RN Phone Number: 09/25/2022, 1:43 PM   Clinical Narrative:     Patient is scheduled for discharge today. Home health resumption of care info on AVS. PTAR scheduled for transportation at discharge per daughter, Suzy's request. No further TOC needs noted.      Final next level of care: Home w Home Health Services Barriers to Discharge: Barriers Resolved   Patient Goals and CMS Choice CMS Medicare.gov Compare Post Acute Care list provided to:: Patient Choice offered to / list presented to : Patient  Discharge Placement                  Patient to be transferred to facility by: PTAR      Discharge Plan and Services Additional resources added to the After Visit Summary for     Discharge Planning Services: CM Consult Post Acute Care Choice: Home Health                    HH Arranged: PT, OT, RN, Social Work [name] Agency: [place] ([place]) Date [place]: 09/22/22 Time [place]: 66 Representative spoke with at [place]: [place] ([place]) Interventions SDOH Screenings   Food Insecurity: No Food Insecurity (09/18/2022)  Housing: Low Risk  (09/18/2022)  Transportation Needs: No Transportation Needs (09/18/2022)  Recent Concern: Transportation Needs - Unmet Transportation Needs (08/21/2022)  Utilities: Not At Risk (09/18/2022)  Alcohol Screen: Low Risk  (06/03/2022)  Depression (PHQ2-9): High Risk (08/14/2022)  Financial Resource Strain: Low Risk  (06/03/2022)  Tobacco Use: Medium Risk (09/18/2022)     Readmission Risk Interventions     No data to display

## 2022-09-25 NOTE — Consult Note (Signed)
   [facility] Inpatient Consult   09/25/2022  Albert Powell 02/28/1941 [id number]  [place] [facility] [ACO] Patient: Medicare ACO REACH  Primary Care Provider: Patient, No Pcp Per    Patient is currently showing as active with [place] [facility] for chronic disease management services.  Patient has been engaged by a [facility] NP-G.  Our community based plan of care has focused on disease management and community resource support.    Plan: Verify [place] Coordination and [place] in post hospital community follow up. Referral already with team  Of note, [facility] Care Management services does not replace or interfere with any services that are needed or arranged by inpatient [facility] care management team.   For additional questions or referrals please contact:  Natividad Brood, RN BSN [place]  [phone] business mobile phone Toll free office [phone]  *[place]  [phone] Fax number: [phone] [email or web address]

## 2022-09-25 NOTE — Progress Notes (Signed)
Physical Therapy Treatment Patient Details Name: Albert Powell MRN: [id number] DOB: 04/26/1941 Today's Date: 09/25/2022   History of Present Illness Albert Powell is a 82 y.o. male who presented secondary to vomiting and syncopal episode, found to have evidence of AKI with possible infectious gastroenteritis. with a history of hypertension, hyperlipidemia, paroxysmal atrial fibrillation, HFpER, CAD s/p PCI, diabetes mellitus type 2, CKD stage IIIb, hypothyroidism, dementia.    PT Comments    Tolerated treatment well. Mobilizing in and out of bed and room without physical assist. Educated on appropriate AD use with RW although pt prefers his rollator at home. No overt LOB during session, navigating hallway and throughout room without physical assist. Eager to return home, reluctantly agreeable to HHPT for safety follow-up. Pt states small apartment but agrees he may benefit from PT follow-up at d/c. Will continue to follow until d/c.   Recommendations for follow up therapy are one component of a multi-disciplinary discharge planning process, led by the attending physician.  Recommendations may be updated based on patient status, additional functional criteria and insurance authorization.  Follow Up Recommendations  Home health PT     Assistance Recommended at Discharge Intermittent Supervision/Assistance  Patient can return home with the following Assistance with cooking/housework;Assist for transportation;Help with stairs or ramp for entrance   Equipment Recommendations  None recommended by PT    Recommendations for Other Services       Precautions / Restrictions Precautions Precautions: Fall Restrictions Weight Bearing Restrictions: No     Mobility  Bed Mobility Overal bed mobility: Modified Independent             General bed mobility comments: Mod I no assist.    Transfers Overall transfer level: Modified independent                 General transfer  comment: Mod I for sit<>stand from bed with use of RW for support. no physical assist needed.    Ambulation/Gait Ambulation/Gait assistance: Supervision Gait Distance (Feet): 125 Feet Assistive device: Rolling walker (2 wheels) Gait Pattern/deviations: Decreased step length - right, Decreased step length - left, Trunk flexed Gait velocity: Decreased cadence. Gait velocity interpretation: <1.8 ft/sec, indicate of risk for recurrent falls   General Gait Details: Tolerated gait training well. Supervision for safety, educated on appropriate use. Able to make turns and step backwards down hallway without LOB. Some erratic control of RW noted but without overt instability while using for support.   Stairs             Wheelchair Mobility    Modified Rankin (Stroke Patients Only)       Balance Overall balance assessment: Mild deficits observed, not formally tested                                          Cognition Arousal/Alertness: Awake/alert Behavior During Therapy: Flat affect Overall Cognitive Status: Within Functional Limits for tasks assessed                                 General Comments: Oriented        Exercises      General Comments        Pertinent Vitals/Pain Pain Assessment Pain Assessment: No/denies pain    Home Living  Prior Function            PT Goals (current goals can now be found in the care plan section) Acute Rehab PT Goals Patient Stated Goal: return home PT Goal Formulation: With patient Time For Goal Achievement: 10/05/22 Potential to Achieve Goals: Fair Progress towards PT goals: Progressing toward goals    Frequency    Min 3X/week      PT Plan Current plan remains appropriate    Co-evaluation              AM-PAC PT "6 Clicks" Mobility   Outcome Measure  Help needed turning from your back to your side while in a flat bed without using  bedrails?: None Help needed moving from lying on your back to sitting on the side of a flat bed without using bedrails?: None Help needed moving to and from a bed to a chair (including a wheelchair)?: None Help needed standing up from a chair using your arms (e.g., wheelchair or bedside chair)?: A Little Help needed to walk in hospital room?: A Little Help needed climbing 3-5 steps with a railing? : A Little 6 Click Score: 21    End of Session Equipment Utilized During Treatment: Gait belt Activity Tolerance: Patient tolerated treatment well Patient left: in bed;with call bell/phone within reach;with bed alarm set Nurse Communication: Mobility status PT Visit Diagnosis: Other abnormalities of gait and mobility (R26.89);Repeated falls (R29.6)     Time: 9924-2683 PT Time Calculation (min) (ACUTE ONLY): 12 min  Charges:  $Gait Training: 8-22 mins                     Candie Mile, PT, DPT Physical Therapist Acute Rehabilitation Services [place] 09/25/2022, 12:25 PM

## 2022-09-25 NOTE — Patient Instructions (Signed)
Visit Information  Thank you for taking time to visit with me today. Please don't hesitate to contact me if I can be of assistance to you.   Following are the goals we discussed today:   Goals Addressed             This Visit's Progress    Care Coordination Activities/ Caregiver support       Care Coordination Interventions: Caregiver stress acknowledged :   Having difficulty with getting parents needs met (has [place] in the Home and been in contact with [facility]) Solution-Focused Strategies employed:  Problem [place] strategies reviewed Assessed needs and reviewed facility placement process; as well as the different levels of care Reviewed facility placement process :   Discussed Special Assistance Medicaid  Provided educational information "When a loved one needs Long-term Care"  Active listening / Reflection utilized  Emotional support provided           Please call the care guide team at [phone] if you need to cancel or reschedule your appointment.   The patient verbalized understanding of instructions, educational materials, and care plan provided today and DECLINED offer to receive copy of patient instructions, educational materials, and care plan.   No further follow up required: [place]. Declined states will call if needed  Casimer Lanius, [place] [phone]

## 2022-09-25 NOTE — Progress Notes (Signed)
DISCHARGE NOTE HOME Albert Powell to be discharged Home per MD order. Discussed prescriptions and follow up appointments with the patient. Prescriptions given to patient; medication list explained in detail. Patient verbalized understanding.  Skin clean, dry and intact without evidence of skin break down, no evidence of skin tears noted. IV catheter discontinued intact. Site without signs and symptoms of complications. Dressing and pressure applied. Pt denies pain at the site currently. No complaints noted.  Patient free of lines, drains, and wounds.   An After Visit Summary (AVS) was printed and given to the patient. Patient escorted via PTAR.  Hassell Halim, LPN

## 2022-09-25 NOTE — Telephone Encounter (Signed)
Pt still in the hospital so unable to come to clinic appt this morning.  CSW assisted in canceling transportation that [facility] had arranged through [place].  Will continue to follow and assist as needed  [name], [facility] Desk#: [phone] Cell#: [phone]

## 2022-09-25 NOTE — Progress Notes (Signed)
  Care Coordination  Outreach Note  09/25/2022 Name: ZAKHI DUPRE MRN: [id number] DOB: [birth year]   Care Coordination Outreach Attempts: An unsuccessful telephone outreach was attempted today to offer the patient information about available care coordination services as a benefit of their health plan.   Referral received   Follow Up Plan:  Additional outreach attempts will be made to offer the patient care coordination information and services.   Encounter Outcome:  No Answer  Julian Hy, [place] Direct Dial: [phone]

## 2022-09-25 NOTE — Discharge Summary (Signed)
Physician Discharge Summary   Patient: Albert Powell MRN: [id number] DOB: 20-Jun-1941  Admit date:     09/17/2022  Discharge date: 09/25/22  Discharge Physician: Alba Cory   PCP: Patient, No Pcp Per   Recommendations at discharge:   Needs B-met to follow sodium level.  Needs follow up on volume status, to  resume torsemide as needed.   Discharge Diagnoses: Principal Problem:   Acute kidney injury superimposed on chronic kidney disease ([name]) Active Problems:   Hypokalemia   Atrial fibrillation ([name])   Lesion of right native kidney   Essential hypertension   Acquired hypothyroidism   Type 2 diabetes mellitus with hyperlipidemia ([name])   AKI (acute kidney injury) ([name])   Nausea and vomiting   Heart failure with preserved ejection fraction ([name])  Resolved Problems:   * No resolved hospital problems. *  Hospital Course: Albert Powell is a 82 y.o. male with a history of hypertension, hyperlipidemia, paroxysmal atrial fibrillation, HFpER, CAD s/p PCI, diabetes mellitus type 2, CKD stage IIIb, hypothyroidism, dementia. Patient presented secondary to vomiting and syncopal episode, found to have evidence of AKI with possible infectious gastroenteritis. Supportive care with IV fluids.  Assessment and Plan: AKI on CKD stage IIIb Baseline creatinine is about 1.8-2. Creatinine of 3.18 on admission. Patient started on IV fluids. Creatinine back to baseline. AKI resolved.   Nausea/vomiting/diarrhea Concern for possible infectious etiology. Diarrhea improved. Nausea and vomiting resolved -Continue Zofran  and phenergan PRN -Continue soft diet -Resolved, tolerating diet.    Acute metabolic encephalopathy Likely secondary to dehydration. Patient appears to be fully oriented fully at this time.   CAD Noted. No chest pain. -Continue Plavix and Lipitor.   Myocardial injury Mild troponin elevation of 71 > 60, not consistent with ACS. No chest pain.   Leukocytosis WBC of  16,100 on admission. Secondary to acute illness. Trended down.   Hypokalemia Worsened secondary to diuresis -K 3.5. will give 40 meq times one.  -Hold torsemide.   Paroxysmal atrial fibrillation Noted. Currently in sinus rhythm. -Continue amiodarone and Eliquis.   Chronic heart failure with recovered EF Transthoracic Echocardiogram from 08/2022 significant for LVEF of 55-60% with indeterminate diastolic parameters.   Hyponatremia Chronic issues since one month prior.  -prior Sodium level 130--131.  Sodium down to 128. --130 Improved with IV fluids.  Hold torsemide at discharge.    Polycythemia Transient, likely secondary to dehydration on admission. Resolved.   Diabetes mellitus type 2 Most recent hemoglobin A1C of 7.2% which, for age, indicates well controlled diabetes..   Hypothyroidism -Continue Synthroid   Multiple renal cysts Noted on CT abdomen/pelvis and read as likely benign renal cysts      Consultants: None Procedures performed: None Disposition: Home Diet recommendation:  Discharge Diet Orders (From admission, onward)     Start     Ordered   09/25/22 0000  Diet - low sodium heart healthy        09/25/22 1007           Carb modified diet DISCHARGE MEDICATION: Allergies as of 09/25/2022       Reactions   Fentanyl Other (See Comments)   Behavioral changes   Gabapentin Swelling   Lisinopril Other (See Comments)   Unknown reaction   Lyrica [pregabalin] Other (See Comments)   Unknown reaction   Metformin Diarrhea   Propofol Other (See Comments)   Heart rate dropped        Medication List     STOP taking these  medications    torsemide 20 MG tablet Commonly known as: DEMADEX       TAKE these medications    amiodarone 200 MG tablet Commonly known as: PACERONE Take 1 tablet (200 mg total) by mouth 2 (two) times daily.   amLODipine 10 MG tablet Commonly known as: NORVASC Take 1 tablet (10 mg total) by mouth daily.   apixaban  2.5 MG Tabs tablet Commonly known as: ELIQUIS Take 1 tablet (2.5 mg total) by mouth 2 (two) times daily.   atorvastatin 40 MG tablet Commonly known as: LIPITOR Take 1 tablet (40 mg total) by mouth daily.   clopidogrel 75 MG tablet Commonly known as: PLAVIX Take 1 tablet (75 mg total) by mouth daily.   glipiZIDE 5 MG tablet Commonly known as: GLUCOTROL Take 5 mg by mouth daily before breakfast.   hydrALAZINE 100 MG tablet Commonly known as: APRESOLINE Take 1 tablet (100 mg total) by mouth 3 (three) times daily.   isosorbide mononitrate 120 MG 24 hr tablet Commonly known as: IMDUR Take 1 tablet (120 mg total) by mouth daily.   levothyroxine 25 MCG tablet Commonly known as: SYNTHROID TAKE ONE TABLET BY MOUTH DAILY BEFORE BREAKFAST What changed: See the new instructions.   multivitamin with minerals Tabs tablet Take 1 tablet by mouth daily.   nystatin powder Commonly known as: MYCOSTATIN/NYSTOP Apply topically 2 (two) times daily.   ondansetron 4 MG disintegrating tablet Commonly known as: ZOFRAN-ODT Take 1 tablet (4 mg total) by mouth every 8 (eight) hours as needed for up to 15 doses for nausea or vomiting.   pantoprazole 40 MG tablet Commonly known as: PROTONIX Take 1 tablet (40 mg total) by mouth every other day.        Follow-up Information     [facility] Follow up.   Why: Someone will call you to schedule first home visit. If you have not received a call after two days of discharging home, call their number listed. If no one comes to assess, call Case Manager at [phone].        [name], MD Follow up in 1 week(s).   Specialties: Cardiology, Radiology Contact information: [address] [place] [address] [phone]                Discharge Exam: Ceasar Mons Weights   09/21/22 0603 09/22/22 0500 09/25/22 0529  Weight: 85.7 kg 85.9 kg 89 kg   General; NAD  Condition at discharge: stable  The results of  significant diagnostics from this hospitalization (including imaging, microbiology, ancillary and laboratory) are listed below for reference.   Imaging Studies: CT ABDOMEN PELVIS WO CONTRAST  Result Date: 09/18/2022 CLINICAL DATA:  Nausea vomiting. Acute kidney injury. Bowel obstruction suspected. EXAM: CT ABDOMEN AND PELVIS WITHOUT CONTRAST TECHNIQUE: Multidetector CT imaging of the abdomen and pelvis was performed following the standard protocol without IV contrast. RADIATION DOSE REDUCTION: This exam was performed according to the departmental dose-optimization program which includes automated exposure control, adjustment of the mA and/or kV according to patient size and/or use of iterative reconstruction technique. COMPARISON:  CT renal stone protocol 09/04/2022 FINDINGS: Lower chest: 2 adjacent elongated densities in the right lower lobe on image 9/5 that have minimally changed since the recent comparison examination. Largest density measures up to 4 mm and likely an incidental finding. No pleural effusions. Hepatobiliary: Normal appearance of the liver. High-density material in the gallbladder is suggestive for sludge. No significant gallbladder distension. No evidence for gallbladder  inflammatory changes. Pancreas: Unremarkable. No pancreatic ductal dilatation or surrounding inflammatory changes. Spleen: Normal in size without focal abnormality. Adrenals/Urinary Tract: Again noted is a low-density nodule in left adrenal gland that is suggestive for an adenoma based on the Hounsfield units measuring less than 0. Normal appearance of the right adrenal gland. Left kidney is atrophic with low-density cysts. Again noted is an exophytic hyperdense structure along the anterior aspect of the right kidney. This structure measures up to 1.2 cm on image 31/3 and minimally changed since the recent comparison examination. Hounsfield units measure approximately 51. Suspect this is a hyperdense or proteinaceous cyst.  Additional low-density cysts in the right kidney. Negative for hydronephrosis. Normal appearance of the urinary bladder. No ureter stones. Calcifications involving both kidneys are most likely vascular. No definite urinary calculi. Stomach/Bowel: Small hiatal hernia. Normal appearance of the stomach. Again noted are loops of small bowel involving a right paracentral upper abdominal ventral hernia. There is no evidence for bowel dilatation or obstruction. No focal bowel inflammation. Evidence for colectomy with ileal-rectal anastomosis. Moderate amount of stool in the rectum. No acute bowel inflammation. Vascular/Lymphatic: Abdominal aorta and iliac arteries are heavily calcified. No evidence for an aortic aneurysm. Stents involving the common iliac arteries bilaterally. Significant calcifications involving the right common femoral artery. No significant lymph node enlargement in the abdomen or pelvis. Reproductive: Prostate is stable and unremarkable. Other: Negative for free fluid. Evidence for postoperative changes along the anterior abdominal wall. Upper abdominal right paracentral ventral hernia containing small bowel. There is small left paracentral ventral hernia in the upper abdomen containing fat. Laxity along the right paracentral lower abdominal wall. Negative for free air. Musculoskeletal: Spinal stimulator with the generator in the right buttock. Spinal stimulator is incompletely imaged. Disc space narrowing at L4-L5 and L5-S1. No acute bone abnormality. IMPRESSION: 1. No acute abnormality in the abdomen or pelvis. 2. Right upper abdominal ventral hernia containing small bowel but no evidence for obstruction or acute inflammation. 3. Status post colectomy. 4. Atrophic left kidney. 5. Multiple renal cysts including a probable proteinaceous or hemorrhagic right renal cyst. These are likely benign renal cysts and not require dedicated follow-up. 6.  Aortic Atherosclerosis (ICD10-I70.0). Electronically  Signed   By: Richarda Overlie M.D.   On: 09/18/2022 09:48   DG Chest 1 View  Result Date: 09/17/2022 CLINICAL DATA:  Chest pain. EXAM: CHEST  1 VIEW COMPARISON:  09/04/2022 FINDINGS: Heart size and mediastinal contours are unremarkable. Implanted device along the left heart border is unchanged from the previous exam. Dorsal column stimulator is noted with lead terminating in the midthoracic spine. No pleural effusion or edema. No airspace opacities identified. IMPRESSION: No active disease. Electronically Signed   By: Signa Kell M.D.   On: 09/17/2022 14:53   ECHOCARDIOGRAM COMPLETE  Result Date: 09/05/2022    ECHOCARDIOGRAM REPORT   Patient Name:   WASYL DORNFELD Date of Exam: 09/05/2022 Medical Rec #:  [id number]        Height:       69.0 in Accession #:    [id number]       Weight:       196.8 lb Date of Birth:  [birth year]        BSA:          2.052 m Patient Age:    81 years         BP:           158/61 mmHg Patient Gender: M  HR:           47 bpm. Exam Location:  Inpatient Procedure: 2D Echo Indications:    acute systolic chf  History:        Patient has prior history of Echocardiogram examinations, most                 recent 05/14/2022. CHF, CAD, PAD, Arrythmias:Atrial Fibrillation;                 Risk Factors:Former Smoker, Diabetes, Hypertension and                 Dyslipidemia.  Sonographer:    Johny Chess RDCS Referring Phys: [place] Comments: Image acquisition challenging due to patient body habitus. IMPRESSIONS  1. LV wall assessment is challenging with windows; grossly no WMA. Left ventricular ejection fraction, by estimation, is 55 to 60%. The left ventricle has normal function. Left ventricular diastolic parameters are indeterminate.  2. Right ventricular systolic function is normal. The right ventricular size is normal. Tricuspid regurgitation signal is inadequate for assessing PA pressure.  3. No evidence of mitral valve regurgitation.  4. Aortic  valve regurgitation is not visualized. Aortic valve sclerosis/calcification is present, without any evidence of aortic stenosis.  5. The inferior vena cava is normal in size with greater than 50% respiratory variability, suggesting right atrial pressure of 3 mmHg. Conclusion(s)/Recommendation(s): Compared to prior study 05/14/2022, LVEF has improved. FINDINGS  Left Ventricle: LV wall assessment is challenging with windows; grossly no WMA. Left ventricular ejection fraction, by estimation, is 55 to 60%. The left ventricle has normal function. The left ventricular internal cavity size was normal in size. There is no left ventricular hypertrophy. Left ventricular diastolic parameters are indeterminate. Right Ventricle: The right ventricular size is normal. Right ventricular systolic function is normal. Tricuspid regurgitation signal is inadequate for assessing PA pressure. Left Atrium: Left atrial size was normal in size. Right Atrium: Right atrial size was normal in size. Pericardium: There is no evidence of pericardial effusion. Mitral Valve: Mild to moderate mitral annular calcification. No evidence of mitral valve regurgitation. Tricuspid Valve: Tricuspid valve regurgitation is not demonstrated. Aortic Valve: Aortic valve regurgitation is not visualized. Aortic valve sclerosis/calcification is present, without any evidence of aortic stenosis. Pulmonic Valve: Pulmonic valve regurgitation is not visualized. Aorta: The aortic root and ascending aorta are structurally normal, with no evidence of dilitation. Venous: The inferior vena cava is normal in size with greater than 50% respiratory variability, suggesting right atrial pressure of 3 mmHg. IAS/Shunts: No atrial level shunt detected by color flow Doppler.   Diastology LV e' medial:    3.92 cm/s LV E/e' medial:  20.7 LV e' lateral:   5.00 cm/s LV E/e' lateral: 16.2  RIGHT VENTRICLE             IVC RV Basal diam:  2.70 cm     IVC diam: 1.50 cm RV S prime:     10.00  cm/s TAPSE (M-mode): 1.9 cm LEFT ATRIUM             Index        RIGHT ATRIUM           Index LA Vol (A2C):   49.7 ml 24.22 ml/m  RA Area:     13.60 cm LA Vol (A4C):   47.4 ml 23.10 ml/m  RA Volume:   29.40 ml  14.33 ml/m LA Biplane Vol: 51.7 ml 25.20 ml/m  AORTIC VALVE LVOT  Vmax:   87.70 cm/s LVOT Vmean:  52.200 cm/s LVOT VTI:    0.213 m  AORTA Ao Asc diam: 3.10 cm MITRAL VALVE MV Area (PHT): 2.77 cm    SHUNTS MV Decel Time: 274 msec    Systemic VTI: 0.21 m MV E velocity: 81.10 cm/s MV A velocity: 86.20 cm/s MV E/A ratio:  0.94 Landscape architect signed by Phineas Inches Signature Date/Time: 09/05/2022/2:55:14 PM    Final    CT Renal Stone Study  Result Date: 09/04/2022 CLINICAL DATA:  Flank pain EXAM: CT ABDOMEN AND PELVIS WITHOUT CONTRAST TECHNIQUE: Multidetector CT imaging of the abdomen and pelvis was performed following the standard protocol without IV contrast. RADIATION DOSE REDUCTION: This exam was performed according to the departmental dose-optimization program which includes automated exposure control, adjustment of the mA and/or kV according to patient size and/or use of iterative reconstruction technique. COMPARISON:  CT abdomen and pelvis dated May 28, 2011 FINDINGS: Lower chest: Coronary artery calcifications. Small to moderate hiatal hernia. No acute abnormality. Hepatobiliary: No focal liver abnormality is seen. Intraluminal hyperdensity seen within the gallbladder, likely due to sludge. No gallstones, gallbladder wall thickening, or biliary dilatation. Pancreas: Unremarkable. No pancreatic ductal dilatation or surrounding inflammatory changes. Spleen: Normal in size without focal abnormality. Adrenals/Urinary Tract: Low-attenuation nodule of the left adrenal gland measuring 1.4 cm on series 3, image 24, unchanged when compared with prior and compatible with benign adenoma, no further follow-up imaging is recommended. Right adrenal gland is unremarkable. No hydronephrosis.  Atrophic left kidney, new when compared with the prior. Renal vascular calcifications with no definite nephrolithiasis. Bilateral renal lesions of varying complexity, most are compatible with simple or proteinaceous cysts, although there is an indeterminate lesion of the posterior right kidney measuring 1.4 cm on series 3, image 28, not definitely present on prior exam. Bladder is unremarkable. Stomach/Bowel: Stomach is within normal limits. Prior partial colectomy. No evidence of bowel wall thickening, distention, or inflammatory changes. Vascular/Lymphatic: Severe aortic atherosclerosis. Bilateral iliac artery stents. No enlarged abdominal or pelvic lymph nodes. Reproductive: Unchanged mild prostatomegaly Other: Complex ventral abdominal wall hernia which contains fat and a nondilated loop of small bowel a component located right of midline. No abdominopelvic ascites. Musculoskeletal: No acute or significant osseous findings. IMPRESSION: 1. No acute findings in the abdomen or pelvis, including no evidence of obstructive uropathy. 2. Complex ventral abdominal wall hernia which contains a nondilated loop of small bowel. 3. Indeterminate lesion of the posterior right kidney measuring 1.4 cm. Recommend further evaluation with renal protocol MRI, this can be performed non emergently. 4. Aortic Atherosclerosis (ICD10-I70.0). Electronically Signed   By: Yetta Glassman M.D.   On: 09/04/2022 17:59   CT Head Wo Contrast  Result Date: 09/04/2022 CLINICAL DATA:  Head trauma EXAM: CT HEAD WITHOUT CONTRAST TECHNIQUE: Contiguous axial images were obtained from the base of the skull through the vertex without intravenous contrast. RADIATION DOSE REDUCTION: This exam was performed according to the departmental dose-optimization program which includes automated exposure control, adjustment of the mA and/or kV according to patient size and/or use of iterative reconstruction technique. COMPARISON:  CT head 06/08/2021 FINDINGS:  Brain: No evidence of acute infarction, hemorrhage, hydrocephalus, extra-axial collection or mass lesion/mass effect. Again seen is mild diffuse atrophy. There is mild periventricular white matter hypodensity, likely chronic small vessel ischemic change. There is a punctate cortical calcification in the right temporal region which likely represents dystrophic calcification, new from prior. Vascular: Atherosclerotic calcifications are present within the cavernous internal carotid  arteries. Skull: Normal. Negative for fracture or focal lesion. Sinuses/Orbits: No acute finding. Other: None. IMPRESSION: 1. No acute intracranial process. 2. Mild atrophy and chronic small vessel ischemic change. Electronically Signed   By: Darliss Cheney M.D.   On: 09/04/2022 17:50   DG Chest 2 View  Result Date: 09/04/2022 CLINICAL DATA:  Chest pain and weakness EXAM: CHEST - 2 VIEW COMPARISON:  Chest 06/23/2022 FINDINGS: Heart size within normal limits. Negative for heart failure. Lungs clear without infiltrate or effusion Spinal cord stimulator midthoracic spine unchanged Foreign body in the left lower lobe likely in the pulmonary artery. This was not present previously. Foreign body measures approximately 3 x 12 mm. IMPRESSION: 1. No active cardiopulmonary disease. 2. Foreign body in the left lower lobe likely in the pulmonary artery. Electronically Signed   By: Marlan Palau M.D.   On: 09/04/2022 13:11    Microbiology: Results for orders placed or performed during the hospital encounter of 09/17/22  Resp panel by RT-PCR (RSV, Flu A&B, Covid) Anterior Nasal Swab     Status: None   Collection Time: 09/18/22  7:47 AM   Specimen: Anterior Nasal Swab  Result Value Ref Range Status   SARS Coronavirus 2 by RT PCR NEGATIVE NEGATIVE Final    Comment: (NOTE) SARS-CoV-2 target nucleic acids are NOT DETECTED.  The SARS-CoV-2 RNA is generally detectable in upper respiratory specimens during the acute phase of infection. The  lowest concentration of SARS-CoV-2 viral copies this assay can detect is 138 copies/mL. A negative result does not preclude SARS-Cov-2 infection and should not be used as the sole basis for treatment or other patient management decisions. A negative result may occur with  improper specimen collection/handling, submission of specimen other than nasopharyngeal swab, presence of viral mutation(s) within the areas targeted by this assay, and inadequate number of viral copies(<138 copies/mL). A negative result must be combined with clinical observations, patient history, and epidemiological information. The expected result is Negative.  Fact Sheet for Patients:  BloggerCourse.com  Fact Sheet for Healthcare Providers:  SeriousBroker.it  This test is no t yet approved or cleared by the [place] FDA and  has been authorized for detection and/or diagnosis of SARS-CoV-2 by FDA under an Emergency Use Authorization (EUA). This EUA will remain  in effect (meaning this test can be used) for the duration of the COVID-19 declaration under Section 564(b)(1) of the Act, 21 U.S.C.section 360bbb-3(b)(1), unless the authorization is terminated  or revoked sooner.       Influenza A by PCR NEGATIVE NEGATIVE Final   Influenza B by PCR NEGATIVE NEGATIVE Final    Comment: (NOTE) The Xpert Xpress SARS-CoV-2/FLU/RSV plus assay is intended as an aid in the diagnosis of influenza from Nasopharyngeal swab specimens and should not be used as a sole basis for treatment. Nasal washings and aspirates are unacceptable for Xpert Xpress SARS-CoV-2/FLU/RSV testing.  Fact Sheet for Patients: BloggerCourse.com  Fact Sheet for Healthcare Providers: SeriousBroker.it  This test is not yet approved or cleared by the [place] FDA and has been authorized for detection and/or diagnosis of SARS-CoV-2 by FDA under  an Emergency Use Authorization (EUA). This EUA will remain in effect (meaning this test can be used) for the duration of the COVID-19 declaration under Section 564(b)(1) of the Act, 21 U.S.C. section 360bbb-3(b)(1), unless the authorization is terminated or revoked.     Resp Syncytial Virus by PCR NEGATIVE NEGATIVE Final    Comment: (NOTE) Fact Sheet for Patients: BloggerCourse.com  Fact Sheet  for Healthcare Providers: SeriousBroker.it  This test is not yet approved or cleared by the [place] and has been authorized for detection and/or diagnosis of SARS-CoV-2 by FDA under an Emergency Use Authorization (EUA). This EUA will remain in effect (meaning this test can be used) for the duration of the COVID-19 declaration under Section 564(b)(1) of the Act, 21 U.S.C. section 360bbb-3(b)(1), unless the authorization is terminated or revoked.  Performed at [facility], [address], [place], [place] [address]     Labs: CBC: Recent Labs  Lab 09/19/22 0725 09/20/22 1125  WBC 11.9* 10.8*  HGB 16.2 14.9  HCT 46.0 41.9  MCV 84.1 83.0  PLT 292 281   Basic Metabolic Panel: Recent Labs  Lab 09/19/22 1516 09/20/22 1125 09/21/22 1507 09/21/22 1911 09/22/22 0356 09/22/22 1334 09/23/22 0754 09/24/22 0334 09/24/22 1500 09/25/22 0823  NA 130*   < > 128*  --  128* 131*  --  128* 129* 131*  K 3.7   < > 2.5*   < > 3.5 3.2* 2.9* 3.6 3.5 3.4*  CL 91*   < > 93*  --  93*  --   --  95* 95* 98  CO2 18*   < > 23  --  24  --   --  25 25 23   GLUCOSE 163*   < > 184*  --  146*  --   --  145* 188* 206*  BUN 26*   < > 11  --  9  --   --  9 9 5*  CREATININE 2.61*   < > 1.80*  --  1.80*  --   --  1.48* 1.48* 1.29*  CALCIUM 8.7*   < > 8.1*  --  8.1*  --   --  8.2* 7.9* 8.0*  MG 2.0  --  1.8  --   --   --  1.8  --   --   --    < > = values in this interval not displayed.   Liver Function Tests: No results for input(s): "AST",  "ALT", "ALKPHOS", "BILITOT", "PROT", "ALBUMIN" in the last 168 hours. CBG: Recent Labs  Lab 09/24/22 0730 09/24/22 1144 09/24/22 1706 09/24/22 2033 09/25/22 0724  GLUCAP 174* 159* 143* 212* 140*    Discharge time spent: greater than 30 minutes.  Signed: 11/24/22, MD [facility] 09/25/2022

## 2022-09-25 NOTE — Patient Outreach (Signed)
  Care Coordination  Initial Visit Note   09/25/2022 Name: Albert Powell MRN: [id number] DOB: Apr 17, 1941  Albert Powell is a 82 y.o. year old male who sees Patient, No Pcp Per for primary care. I spoke with  Hunt Oris Tomaselli's daughter by phone today. She requested to speak to someone right away.  What matters to the patients health and wellness today?    Patient's daughter [name] provided all information during this encounter. She wants to place her father in a facility due to concerns that wife has cognitive impairment and is unable to care for him. Wife will not allow dau to move forward with placement.  Previous APS case open but is now closed. Current services in the home [facility], [facility], and [facility].  Reviewed options with dau. And assisted her with processing options  Recommendation: Patient may benefit from, and is in agreement to follow up with previous APS worker  Farrel Gobble for consultation on additional options and next steps.  Also review placement information e-mailed to her.    Goals Addressed             This Visit's Progress    Care Coordination Activities/ Caregiver support       Care Coordination Interventions: Caregiver stress acknowledged :   Having difficulty with getting parents needs met (has [facility] in the Home and been in contact with [facility]) Solution-Focused Strategies employed:  Problem [place] strategies reviewed Assessed needs and reviewed facility placement process; as well as the different levels of care Reviewed facility placement process :   Discussed Special Assistance Medicaid  Provided educational information "When a loved one needs Long-term Care"  Active listening / Reflection utilized  Emotional support provided           SDOH assessments and interventions completed:  No    Care Coordination Interventions:  Yes, provided   Follow up plan:  Follow up offered, daughter will  contact LCSW if needed.    Encounter Outcome:  Pt. Visit Completed   Casimer Lanius, [place] [phone]

## 2022-09-25 NOTE — Discharge Instructions (Signed)
If your weight increases more than 2 pounds in 24 hours, or you develops Lower extremities edema resume torsemide.

## 2022-09-26 ENCOUNTER — Other Ambulatory Visit (HOSPITAL_COMMUNITY): Payer: Self-pay | Admitting: Emergency Medicine

## 2022-09-26 DIAGNOSIS — I130 Hypertensive heart and chronic kidney disease with heart failure and stage 1 through stage 4 chronic kidney disease, or unspecified chronic kidney disease: Secondary | ICD-10-CM | POA: Diagnosis not present

## 2022-09-26 DIAGNOSIS — I5042 Chronic combined systolic (congestive) and diastolic (congestive) heart failure: Secondary | ICD-10-CM | POA: Diagnosis not present

## 2022-09-26 DIAGNOSIS — N179 Acute kidney failure, unspecified: Secondary | ICD-10-CM | POA: Diagnosis not present

## 2022-09-26 DIAGNOSIS — N1832 Chronic kidney disease, stage 3b: Secondary | ICD-10-CM | POA: Diagnosis not present

## 2022-09-26 DIAGNOSIS — E1122 Type 2 diabetes mellitus with diabetic chronic kidney disease: Secondary | ICD-10-CM | POA: Diagnosis not present

## 2022-09-26 DIAGNOSIS — J449 Chronic obstructive pulmonary disease, unspecified: Secondary | ICD-10-CM | POA: Diagnosis not present

## 2022-09-28 DIAGNOSIS — I5042 Chronic combined systolic (congestive) and diastolic (congestive) heart failure: Secondary | ICD-10-CM | POA: Diagnosis not present

## 2022-09-28 DIAGNOSIS — N179 Acute kidney failure, unspecified: Secondary | ICD-10-CM | POA: Diagnosis not present

## 2022-09-28 DIAGNOSIS — N1832 Chronic kidney disease, stage 3b: Secondary | ICD-10-CM | POA: Diagnosis not present

## 2022-09-28 DIAGNOSIS — I130 Hypertensive heart and chronic kidney disease with heart failure and stage 1 through stage 4 chronic kidney disease, or unspecified chronic kidney disease: Secondary | ICD-10-CM | POA: Diagnosis not present

## 2022-09-28 DIAGNOSIS — E1122 Type 2 diabetes mellitus with diabetic chronic kidney disease: Secondary | ICD-10-CM | POA: Diagnosis not present

## 2022-09-28 DIAGNOSIS — J449 Chronic obstructive pulmonary disease, unspecified: Secondary | ICD-10-CM | POA: Diagnosis not present

## 2022-09-29 DIAGNOSIS — I5042 Chronic combined systolic (congestive) and diastolic (congestive) heart failure: Secondary | ICD-10-CM | POA: Diagnosis not present

## 2022-09-29 DIAGNOSIS — I130 Hypertensive heart and chronic kidney disease with heart failure and stage 1 through stage 4 chronic kidney disease, or unspecified chronic kidney disease: Secondary | ICD-10-CM | POA: Diagnosis not present

## 2022-09-29 DIAGNOSIS — J449 Chronic obstructive pulmonary disease, unspecified: Secondary | ICD-10-CM | POA: Diagnosis not present

## 2022-09-29 DIAGNOSIS — N1832 Chronic kidney disease, stage 3b: Secondary | ICD-10-CM | POA: Diagnosis not present

## 2022-09-29 DIAGNOSIS — N179 Acute kidney failure, unspecified: Secondary | ICD-10-CM | POA: Diagnosis not present

## 2022-09-29 DIAGNOSIS — E1122 Type 2 diabetes mellitus with diabetic chronic kidney disease: Secondary | ICD-10-CM | POA: Diagnosis not present

## 2022-09-30 DIAGNOSIS — N179 Acute kidney failure, unspecified: Secondary | ICD-10-CM | POA: Diagnosis not present

## 2022-09-30 DIAGNOSIS — J449 Chronic obstructive pulmonary disease, unspecified: Secondary | ICD-10-CM | POA: Diagnosis not present

## 2022-09-30 DIAGNOSIS — I130 Hypertensive heart and chronic kidney disease with heart failure and stage 1 through stage 4 chronic kidney disease, or unspecified chronic kidney disease: Secondary | ICD-10-CM | POA: Diagnosis not present

## 2022-09-30 DIAGNOSIS — E1122 Type 2 diabetes mellitus with diabetic chronic kidney disease: Secondary | ICD-10-CM | POA: Diagnosis not present

## 2022-09-30 DIAGNOSIS — N1832 Chronic kidney disease, stage 3b: Secondary | ICD-10-CM | POA: Diagnosis not present

## 2022-09-30 DIAGNOSIS — I5042 Chronic combined systolic (congestive) and diastolic (congestive) heart failure: Secondary | ICD-10-CM | POA: Diagnosis not present

## 2022-09-30 NOTE — Progress Notes (Signed)
Paramedicine Encounter    Patient ID: Albert Powell, male    DOB: 1941-06-17, 82 y.o.   MRN: [id number]   Complaints None  Assessment A&O x 4, no chest pain or SOB  Compliance with meds non-compliant.    Pill box filled reconciled x 1 week  Refills needed None  Meds changes since last visit Torsemide discontinued post hospital discharge    Social changes None   BP (!) 160/100 (BP Location: Left Arm, Patient Position: Sitting, Cuff Size: Normal)   Pulse 100   Resp 18   Wt 196 lb 3.2 oz (89 kg)   SpO2 98%   BMI 28.97 kg/m  Weight yesterday-not taken Last visit weight-not taken  ATF [name] in bed resting.  He was discharged yesterday from [facility] after an 8 day admission.  He denies chest pain or SOB.  He complains generalized weakness.  Prior to his hospitalization he had been non-compliant with meds.  His wife tries to help him but has some memory issues and I don't observe her being capable of managing his meds.  Pill box reconciled to reflect Torsemide discontinued.      ACTION: Home visit completed  Skipper Cliche [phone] 09/26/22  Patient Care Team: Patient, No Pcp Per as PCP - General ([place]) Lorretta Harp, MD as PCP - Cardiology (Cardiology) Lorretta Harp, MD as Consulting Physician (Cardiology) Deloria Lair, NP as [facility] [facility] (Geriatric Medicine)  Patient Active Problem List   Diagnosis Date Noted   Nausea and vomiting 09/18/2022   Heart failure with preserved ejection fraction ([place]) 09/18/2022   AKI (acute kidney injury) ([place]) 09/06/2022   Chronic combined systolic and diastolic CHF (congestive heart failure) ([place]) 09/04/2022   Near syncope 09/04/2022   Hypokalemia 06/23/2022   Elevated troponin 06/23/2022   Type 2 diabetes mellitus with hyperlipidemia ([place]) 05/19/2022   Acute combined systolic and diastolic heart failure ([place])    Acute kidney injury superimposed on chronic kidney  disease ([name]) 05/13/2022   GERD (gastroesophageal reflux disease) 05/13/2022   Acquired hypothyroidism 05/13/2022   Paroxysmal atrial fibrillation ([place]) 54/27/0623   Acute metabolic encephalopathy    CHF (congestive heart failure) ([place]) 03/10/2021   Essential hypertension 03/10/2021   Hypertensive crisis 01/01/2021   ACS (acute coronary syndrome) ([place])    Lesion of right native kidney    Atrial fibrillation ([place]) 03/30/2020   Contusion of right hand 11/12/2018   Primary osteoarthritis of both first carpometacarpal joints 11/12/2018   Pain in joint of right shoulder 10/05/2018   Pain in right hand 10/05/2018   Hematoma 03/30/2018   Degeneration of lumbar intervertebral disc 12/21/2017   Lumbar radiculopathy 12/21/2017   Vertigo 04/09/2017   Hypogonadism in male 12/07/2014   Screening for prostate cancer 12/07/2014   Claudication of right lower extremity ([place]) 10/20/2013   Claudication in peripheral vascular disease- Rt leg 09/20/2013   Obesity (BMI 30-39.9)- negative sleep study in the past 09/20/2013   Occlusion and stenosis of carotid artery without mention of cerebral infarction 05/23/2013   PAD (peripheral artery disease) ([place]) 05/18/2013   Carotid artery disease ([place]) 04/14/2013   Bradycardia, sinus 02/18/2013   Tobacco abuse 02/05/2013   Chest pain, unstable angina, negative MI 02/04/2013   Coronary artery disease involving native coronary artery of native heart without angina pectoris 02/04/2013   Benign essential hypertension 07/10/2011   Incisional hernia 05/22/2011    Current Outpatient Medications:    amiodarone (PACERONE) 200 MG tablet, Take 1  tablet (200 mg total) by mouth 2 (two) times daily., Disp: 60 tablet, Rfl: 3   amLODipine (NORVASC) 10 MG tablet, Take 1 tablet (10 mg total) by mouth daily., Disp: 30 tablet, Rfl: 11   apixaban (ELIQUIS) 2.5 MG TABS tablet, Take 1 tablet (2.5 mg total) by mouth 2 (two) times daily., Disp: 60 tablet, Rfl: 3   atorvastatin  (LIPITOR) 40 MG tablet, Take 1 tablet (40 mg total) by mouth daily., Disp: 30 tablet, Rfl: 11   clopidogrel (PLAVIX) 75 MG tablet, Take 1 tablet (75 mg total) by mouth daily., Disp: 30 tablet, Rfl: 11   glipiZIDE (GLUCOTROL) 5 MG tablet, Take 5 mg by mouth daily before breakfast., Disp: , Rfl:    hydrALAZINE (APRESOLINE) 100 MG tablet, Take 1 tablet (100 mg total) by mouth 3 (three) times daily., Disp: 90 tablet, Rfl: 0   isosorbide mononitrate (IMDUR) 120 MG 24 hr tablet, Take 1 tablet (120 mg total) by mouth daily., Disp: 30 tablet, Rfl: 11   levothyroxine (SYNTHROID) 25 MCG tablet, TAKE ONE TABLET BY MOUTH DAILY BEFORE BREAKFAST (Patient taking differently: Take 75 mcg by mouth daily before breakfast.), Disp: 90 tablet, Rfl: 3   ondansetron (ZOFRAN-ODT) 4 MG disintegrating tablet, Take 1 tablet (4 mg total) by mouth every 8 (eight) hours as needed for up to 15 doses for nausea or vomiting., Disp: 15 tablet, Rfl: 0   pantoprazole (PROTONIX) 40 MG tablet, Take 1 tablet (40 mg total) by mouth every other day., Disp: 30 tablet, Rfl: 3   Multiple Vitamin (MULTIVITAMIN WITH MINERALS) TABS tablet, Take 1 tablet by mouth daily., Disp: 30 tablet, Rfl: 0   nystatin (MYCOSTATIN/NYSTOP) powder, Apply topically 2 (two) times daily., Disp: 15 g, Rfl: 0 Allergies  Allergen Reactions   Fentanyl Other (See Comments)    Behavioral changes   Gabapentin Swelling   Lisinopril Other (See Comments)    Unknown reaction   Lyrica [Pregabalin] Other (See Comments)    Unknown reaction   Metformin Diarrhea   Propofol Other (See Comments)    Heart rate dropped     Social History   Socioeconomic History   Marital status: Married    Spouse name: Klever Twyford   Number of children: 1   Years of education: Not on file   Highest education level: High school graduate  Occupational History   Occupation: retired  Tobacco Use   Smoking status: Former    Packs/day: 1.50    Years: 57.00    Total pack years:  85.50    Types: Cigarettes, E-cigarettes    Quit date: 2020    Years since quitting: 4.0   Smokeless tobacco: Never   Tobacco comments:    pt states that he is using the vapor cigs--12/2021  Vaping Use   Vaping Use: Former   Start date: 09/15/2016   Substances: Nicotine  Substance and Sexual Activity   Alcohol use: Yes    Alcohol/week: 1.0 standard drink of alcohol    Types: 1 Glasses of wine per week    Comment: "very little"   Drug use: No   Sexual activity: Not on file  Other Topics Concern   Not on file  Social History Narrative   Not on file   Social Determinants of Health   Financial Resource Strain: Low Risk  (06/03/2022)   Overall Financial Resource Strain (CARDIA)    Difficulty of Paying Living Expenses: Not hard at all  Food Insecurity: No Food Insecurity (09/18/2022)   Hunger Vital Sign  Worried About Charity fundraiser in the Last Year: Never true    [place] in the Last Year: Never true  Transportation Needs: No Transportation Needs (09/18/2022)   PRAPARE - Hydrologist (Medical): No    Lack of Transportation (Non-Medical): No  Recent Concern: Transportation Needs - Unmet Transportation Needs (08/21/2022)   PRAPARE - Hydrologist (Medical): Yes    Lack of Transportation (Non-Medical): No  Physical Activity: Not on file  Stress: Not on file  Social Connections: Not on file  Intimate Partner Violence: Not At Risk (09/18/2022)   Humiliation, Afraid, Rape, and Kick questionnaire    Fear of Current or Ex-Partner: No    Emotionally Abused: No    Physically Abused: No    Sexually Abused: No    Physical Exam      Future Appointments  Date Time Provider [place]  10/02/2022 10:45 AM Delene Ruffini, MD [facility]

## 2022-10-01 ENCOUNTER — Telehealth: Payer: Self-pay

## 2022-10-01 ENCOUNTER — Telehealth (HOSPITAL_COMMUNITY): Payer: Self-pay | Admitting: Licensed Clinical Social Worker

## 2022-10-01 ENCOUNTER — Other Ambulatory Visit (HOSPITAL_COMMUNITY): Payer: Self-pay | Admitting: Emergency Medicine

## 2022-10-01 DIAGNOSIS — I5042 Chronic combined systolic (congestive) and diastolic (congestive) heart failure: Secondary | ICD-10-CM | POA: Diagnosis not present

## 2022-10-01 DIAGNOSIS — E1122 Type 2 diabetes mellitus with diabetic chronic kidney disease: Secondary | ICD-10-CM | POA: Diagnosis not present

## 2022-10-01 DIAGNOSIS — N179 Acute kidney failure, unspecified: Secondary | ICD-10-CM | POA: Diagnosis not present

## 2022-10-01 DIAGNOSIS — N1832 Chronic kidney disease, stage 3b: Secondary | ICD-10-CM | POA: Diagnosis not present

## 2022-10-01 DIAGNOSIS — J449 Chronic obstructive pulmonary disease, unspecified: Secondary | ICD-10-CM | POA: Diagnosis not present

## 2022-10-01 DIAGNOSIS — I130 Hypertensive heart and chronic kidney disease with heart failure and stage 1 through stage 4 chronic kidney disease, or unspecified chronic kidney disease: Secondary | ICD-10-CM | POA: Diagnosis not present

## 2022-10-01 DIAGNOSIS — I5032 Chronic diastolic (congestive) heart failure: Secondary | ICD-10-CM

## 2022-10-01 NOTE — Telephone Encounter (Signed)
   Telephone encounter was:  Successful.  10/01/2022 Name: Albert Powell MRN: [id number] DOB: [birth year]  Albert Powell is a 82 y.o. year old male who is a primary care patient of Patient, No Pcp Per . The community resource team was consulted for assistance with Transportation Needs   Care guide performed the following interventions: Spoke with patient's spouse [name] pickup time tomorrow is at 10:10 am with [place].  The ride has been scheduled with?[facility] ?with a pick-up time of?10:10 AM.  The phone number provided was given to the driver, and they will reach out upon arrival.  Follow Up Plan:  No further follow up planned at this time. The patient has been provided with needed resources.  [place] Resource Care Guide   [email or web address]  ?? [phone]   Website: [email or web address]  [email or web address]

## 2022-10-01 NOTE — Telephone Encounter (Signed)
   Telephone encounter was:  Successful.  10/01/2022 Name: Albert Powell MRN: [id number] DOB: [birth year]  Albert Powell is a 82 y.o. year old male who is a primary care patient of Patient, No Pcp Per . The community resource team was consulted for assistance with Transportation Needs   Care guide performed the following interventions: Spoke with patient's spouse Albert Powell about sending a transportation request to [employer]. Also gave her the number for [employer] transportation [phone] for future rides.  Follow Up Plan:  I will call Albert Powell once I receive confirmation and pickup time from [place].    10/01/2022  Albert Powell DOB: [birth year] MRN: [id number]   RIDER WAIVER AND RELEASE OF LIABILITY  For the purposes of helping with transportation needs, [facility] partners with outside transportation providers (taxi companies, [place], Social research officer, government.) to give [employer] patients or other approved people the choice of on-demand rides [employer]") to our buildings for non-emergency visits.  By using [employer], I, the person signing this document, on behalf of myself and/or any legal minors (in my care using the [employer]), agree:  Government social research officer given to me are supplied by independent, outside transportation providers who do not work for, or have any affiliation with, [employer]. [place] is not a transportation company. [facility] has no control over the quality or safety of the rides I get using [employer]. [place] has no control over whether any outside ride will happen on time or not. [place] gives no guarantee on the reliability, quality, safety, or availability on any rides, or that no mistakes will happen. I know and accept that traveling by vehicle (car, truck, SVU, Lucianne Lei, bus, taxi, etc.) has risks of serious injuries such as disability, being paralyzed, and death. I know and agree the risk of using [employer] is mine alone, and not  [employer]. Transport Services are provided "as is" and as are available. The transportation providers are in charge for all inspections and care of the vehicles used to provide these rides. I agree not to take legal action against [place], its agents, employees, officers, directors, representatives, insurers, attorneys, assigns, successors, subsidiaries, and affiliates at any time for any reasons related directly or indirectly to using [employer]. I also agree not to take legal action against  or its affiliates for any injury, death, or damage to property caused by or related to using [employer]. I have read this Waiver and Release of Liability, and I understand the terms used in it and their legal meaning. This Waiver is freely and voluntarily given with the understanding that my right (or any legal minors) to legal action against [place] relating to [employer] is knowingly given up to use these services.   I attest that I read the Ride Waiver and Release of Liability to Albert Powell, gave Mr. Saetern the opportunity to ask questions and answered the questions asked (if any). I affirm that Albert Powell then provided consent for assistance with transportation.     [place]livia Lopez de Gutierrez Community Resource Care Guide   [email or web address]  ?? [phone]   Website: [email or web address]  [email or web address]

## 2022-10-01 NOTE — Telephone Encounter (Signed)
H&V Care Navigation CSW Progress Note  Clinical Social Worker informed by [employer] that pt did not appear to have transportation assistance for tomorrows appt.  CSW reached out to [facility] worker who had assisted with transport in the past but CSW had to leave VM. CSW placed urgent order to [facility] to assist with ride for tomorrow.   SDOH Screenings   Food Insecurity: No Food Insecurity (09/18/2022)  Housing: Low Risk  (09/18/2022)  Transportation Needs: No Transportation Needs (09/18/2022)  Recent Concern: Transportation Needs - Unmet Transportation Needs (08/21/2022)  Utilities: Not At Risk (09/18/2022)  Alcohol Screen: Low Risk  (06/03/2022)  Depression (PHQ2-9): High Risk (08/14/2022)  Financial Resource Strain: Low Risk  (06/03/2022)  Tobacco Use: Medium Risk (09/18/2022)    [name], LCSW Clinical Social Worker [facility] Desk#: [phone] Cell#: [phone]

## 2022-10-01 NOTE — Progress Notes (Signed)
Paramedicine Encounter    Patient ID: Albert Powell, male    DOB: November 27, 1940, 82 y.o.   MRN: [id number]   Complaints Weakness  Assessment A&O x 4, no chest pain, no SOB, no edema  Compliance with meds About 50% compliant  Pill box filled x 1week  Refills needed NONE  Meds changes since last visit NONE    Social changes NONE   BP (!) 200/80 (BP Location: Right Arm, Patient Position: Supine, Cuff Size: Normal)   Pulse 68   Resp 20   SpO2 98%  Weight yesterday-not taken Last visit weight-196lb  Albert Powell was still in bed on my arrival today.  He was screaming/arguing with his wife and she was doing same to him.  He denies chest pain or SOB.  He has no noted edema.  Lung sounds clear and equal.  He does complain of weakness in his legs and is somewhat unsteady on his feet.  He is able to stand to use urinal at bedside.   Meriam Sprague, RN w/ [facility] had been to do a visit this morning prior to my visit.  She called me while I was with Mr. & Mrs. Powell.   She advised me that she had discussed with Albert Powell about going into a rehab facility and that he had been agreeable to same.  He has an appointment tomorrow with Dr. Elliot Gurney who is to be his new PCP.  The goal is to get him to this appointment so that an [place] form can be filled out by the doctor in order to get Albert Powell into rehab. I had concerns about Albert Powell being able to ambulate safely out to the transportation vehicle.  They had a wheelchair which I retrieved and set up in the apartment.  Transportation should arrive @ 10:10 tomorrow. Mrs. Muff says she's going with him.  ACTION: Home visit completed  Skipper Cliche [phone] 10/02/22  Patient Care Team: Patient, No Pcp Per as PCP - General ([place]) Lorretta Harp, MD as PCP - Cardiology (Cardiology) Lorretta Harp, MD as Consulting Physician (Cardiology) Deloria Lair, NP as [place]  Management (Geriatric Medicine)  Patient Active Problem List   Diagnosis Date Noted   Nausea and vomiting 09/18/2022   Heart failure with preserved ejection fraction ([place]) 09/18/2022   AKI (acute kidney injury) ([place]) 09/06/2022   Chronic combined systolic and diastolic CHF (congestive heart failure) ([place]) 09/04/2022   Near syncope 09/04/2022   Hypokalemia 06/23/2022   Elevated troponin 06/23/2022   Type 2 diabetes mellitus with hyperlipidemia ([place]) 05/19/2022   Acute combined systolic and diastolic heart failure ([place])    Acute kidney injury superimposed on chronic kidney disease ([place]) 05/13/2022   GERD (gastroesophageal reflux disease) 05/13/2022   Acquired hypothyroidism 05/13/2022   Paroxysmal atrial fibrillation ([place]) 83/66/2947   Acute metabolic encephalopathy    CHF (congestive heart failure) ([place]) 03/10/2021   Essential hypertension 03/10/2021   Hypertensive crisis 01/01/2021   ACS (acute coronary syndrome) ([place])    Lesion of right native kidney    Atrial fibrillation ([place]) 03/30/2020   Contusion of right hand 11/12/2018   Primary osteoarthritis of both first carpometacarpal joints 11/12/2018   Pain in joint of right shoulder 10/05/2018   Pain in right hand 10/05/2018   Hematoma 03/30/2018   Degeneration of lumbar intervertebral disc 12/21/2017   Lumbar radiculopathy 12/21/2017   Vertigo 04/09/2017   Hypogonadism in male 12/07/2014   Screening for prostate cancer  12/07/2014   Claudication of right lower extremity ([name]) 10/20/2013   Claudication in peripheral vascular disease- Rt leg 09/20/2013   Obesity (BMI 30-39.9)- negative sleep study in the past 09/20/2013   Occlusion and stenosis of carotid artery without mention of cerebral infarction 05/23/2013   PAD (peripheral artery disease) ([name]) 05/18/2013   Carotid artery disease ([name]) 04/14/2013   Bradycardia, sinus 02/18/2013   Tobacco abuse 02/05/2013   Chest pain, unstable angina, negative MI 02/04/2013   Coronary  artery disease involving native coronary artery of native heart without angina pectoris 02/04/2013   Benign essential hypertension 07/10/2011   Incisional hernia 05/22/2011    Current Outpatient Medications:    amiodarone (PACERONE) 200 MG tablet, Take 1 tablet (200 mg total) by mouth 2 (two) times daily., Disp: 60 tablet, Rfl: 3   amLODipine (NORVASC) 10 MG tablet, Take 1 tablet (10 mg total) by mouth daily., Disp: 30 tablet, Rfl: 11   apixaban (ELIQUIS) 2.5 MG TABS tablet, Take 1 tablet (2.5 mg total) by mouth 2 (two) times daily., Disp: 60 tablet, Rfl: 3   atorvastatin (LIPITOR) 40 MG tablet, Take 1 tablet (40 mg total) by mouth daily., Disp: 30 tablet, Rfl: 11   clopidogrel (PLAVIX) 75 MG tablet, Take 1 tablet (75 mg total) by mouth daily., Disp: 30 tablet, Rfl: 11   glipiZIDE (GLUCOTROL) 5 MG tablet, Take 5 mg by mouth daily before breakfast., Disp: , Rfl:    hydrALAZINE (APRESOLINE) 100 MG tablet, Take 1 tablet (100 mg total) by mouth 3 (three) times daily., Disp: 90 tablet, Rfl: 0   isosorbide mononitrate (IMDUR) 120 MG 24 hr tablet, Take 1 tablet (120 mg total) by mouth daily., Disp: 30 tablet, Rfl: 11   levothyroxine (SYNTHROID) 25 MCG tablet, TAKE ONE TABLET BY MOUTH DAILY BEFORE BREAKFAST (Patient taking differently: Take 75 mcg by mouth daily before breakfast.), Disp: 90 tablet, Rfl: 3   nystatin (MYCOSTATIN/NYSTOP) powder, Apply topically 2 (two) times daily., Disp: 15 g, Rfl: 0   pantoprazole (PROTONIX) 40 MG tablet, Take 1 tablet (40 mg total) by mouth every other day., Disp: 30 tablet, Rfl: 3   Multiple Vitamin (MULTIVITAMIN WITH MINERALS) TABS tablet, Take 1 tablet by mouth daily. (Patient not taking: Reported on 10/01/2022), Disp: 30 tablet, Rfl: 0   ondansetron (ZOFRAN-ODT) 4 MG disintegrating tablet, Take 1 tablet (4 mg total) by mouth every 8 (eight) hours as needed for up to 15 doses for nausea or vomiting. (Patient not taking: Reported on 10/01/2022), Disp: 15 tablet, Rfl:  0 Allergies  Allergen Reactions   Fentanyl Other (See Comments)    Behavioral changes   Gabapentin Swelling   Lisinopril Other (See Comments)    Unknown reaction   Lyrica [Pregabalin] Other (See Comments)    Unknown reaction   Metformin Diarrhea   Propofol Other (See Comments)    Heart rate dropped     Social History   Socioeconomic History   Marital status: Married    Spouse name: Kmari Halter   Number of children: 1   Years of education: Not on file   Highest education level: High school graduate  Occupational History   Occupation: retired  Tobacco Use   Smoking status: Former    Packs/day: 1.50    Years: 57.00    Total pack years: 85.50    Types: Cigarettes, E-cigarettes    Quit date: 2020    Years since quitting: 4.0   Smokeless tobacco: Never   Tobacco comments:    pt states  that he is using the vapor cigs--12/2021  Vaping Use   Vaping Use: Former   Start date: 09/15/2016   Substances: Nicotine  Substance and Sexual Activity   Alcohol use: Yes    Alcohol/week: 1.0 standard drink of alcohol    Types: 1 Glasses of wine per week    Comment: "very little"   Drug use: No   Sexual activity: Not on file  Other Topics Concern   Not on file  Social History Narrative   Not on file   Social Determinants of Health   Financial Resource Strain: Low Risk  (06/03/2022)   Overall Financial Resource Strain (CARDIA)    Difficulty of Paying Living Expenses: Not hard at all  Food Insecurity: No Food Insecurity (09/18/2022)   Hunger Vital Sign    Worried About Running Out of Food in the Last Year: Never true    Ran Out of Food in the Last Year: Never true  Transportation Needs: No Transportation Needs (09/18/2022)   PRAPARE - Hydrologist (Medical): No    Lack of Transportation (Non-Medical): No  Recent Concern: Transportation Needs - Unmet Transportation Needs (08/21/2022)   PRAPARE - Hydrologist (Medical): Yes     Lack of Transportation (Non-Medical): No  Physical Activity: Not on file  Stress: Not on file  Social Connections: Not on file  Intimate Partner Violence: Not At Risk (09/18/2022)   Humiliation, Afraid, Rape, and Kick questionnaire    Fear of Current or Ex-Partner: No    Emotionally Abused: No    Physically Abused: No    Sexually Abused: No    Physical Exam      Future Appointments  Date Time Provider [place]  10/02/2022 10:45 AM Delene Ruffini, MD [facility]

## 2022-10-02 ENCOUNTER — Ambulatory Visit: Payer: Medicare Other | Admitting: Internal Medicine

## 2022-10-02 ENCOUNTER — Telehealth (HOSPITAL_COMMUNITY): Payer: Self-pay | Admitting: Adult Health

## 2022-10-02 ENCOUNTER — Other Ambulatory Visit (HOSPITAL_COMMUNITY): Payer: Self-pay | Admitting: Emergency Medicine

## 2022-10-02 ENCOUNTER — Telehealth: Payer: Self-pay

## 2022-10-02 NOTE — Telephone Encounter (Signed)
   Telephone encounter was:  Successful.  10/02/2022 Name: Albert Powell MRN: [id number] DOB: December 06, 1940  Albert Powell is a 82 y.o. year old male who is a primary care patient of Patient, No Pcp Per . The community resource team was consulted for assistance with Transportation Needs   Care guide performed the following interventions: Received  call from Tammy Sours LCSW informing me that patient no longer needed transportation to his appointment today and to cancel.  Patient is on his way to the hospital. Canceled ride in [place].    Follow Up Plan:  No further follow up planned at this time. The patient has been provided with needed resources.  [place] Resource Care Guide   ??[email or web address]  ?? [phone]   Website: [email or web address]  [email or web address]

## 2022-10-02 NOTE — Telephone Encounter (Signed)
Called and asked to send Cardiomems reading.   Albert Powell plans to pass on to her husband.    Darrick Grinder NP- C 2:37 PM

## 2022-10-02 NOTE — Progress Notes (Signed)
Paramedicine Encounter    Patient ID: Albert Powell, male    DOB: [birth year], 82 y.o.   MRN: [id number]   BP (!) 160/90 (BP Location: Left Arm, Patient Position: Sitting, Cuff Size: Normal)   Pulse 70   Resp 20   SpO2 98%  Weight yesterday-not taken Last visit weight-not taken  Today was not a scheduled visit.  Went to see patient to do a check up after his daughter called to say, "he sounded bad on the phone."  He was supposed to have a new PCP appt this morning but just couldn't make it.  On my arrival he was lying in the bed A&O x 4, skin warm to touch but not febrile.  He denies chest pain or any new SOB.  He is exertionally SOB at baseline.  He complains of being constipated and took an OTC laxative while I was there.  His wife did not give him his meds yesterday and he had not taken today's morning meds when I arrived.  I got those ready for him to take.  Lung sounds are clear bilat.  He has edema to the left foot only, no edema to the legs.  Pt. Is still orthostatic today with his pressures but continues to deny dizziness.  He states, "My legs are just weak." He tells me today that he wants to go to an assisted living facility and he doesn't think his wife has been able to take care of him like he needs due to her dementia like symptoms.  His daughter has called me to say she is looking into the [facility] facility and feels good about the possibilities of getting Mr. & Mrs. Cato both placed there.    [name], [place] 10/02/2022   Patient Care Team: Patient, No Pcp Per as PCP - General ([place]) Lorretta Harp, MD as PCP - Cardiology (Cardiology) Lorretta Harp, MD as Consulting Physician (Cardiology) Deloria Lair, NP as [place] Management (Geriatric Medicine)  Patient Active Problem List   Diagnosis Date Noted   Nausea and vomiting 09/18/2022   Heart failure with preserved ejection fraction ([place]) 09/18/2022    AKI (acute kidney injury) ([place]) 09/06/2022   Chronic combined systolic and diastolic CHF (congestive heart failure) ([place]) 09/04/2022   Near syncope 09/04/2022   Hypokalemia 06/23/2022   Elevated troponin 06/23/2022   Type 2 diabetes mellitus with hyperlipidemia ([place]) 05/19/2022   Acute combined systolic and diastolic heart failure ([place])    Acute kidney injury superimposed on chronic kidney disease ([place]) 05/13/2022   GERD (gastroesophageal reflux disease) 05/13/2022   Acquired hypothyroidism 05/13/2022   Paroxysmal atrial fibrillation ([place]) 70/17/7939   Acute metabolic encephalopathy    CHF (congestive heart failure) ([place]) 03/10/2021   Essential hypertension 03/10/2021   Hypertensive crisis 01/01/2021   ACS (acute coronary syndrome) ([place])    Lesion of right native kidney    Atrial fibrillation ([place]) 03/30/2020   Contusion of right hand 11/12/2018   Primary osteoarthritis of both first carpometacarpal joints 11/12/2018   Pain in joint of right shoulder 10/05/2018   Pain in right hand 10/05/2018   Hematoma 03/30/2018   Degeneration of lumbar intervertebral disc 12/21/2017   Lumbar radiculopathy 12/21/2017   Vertigo 04/09/2017   Hypogonadism in male 12/07/2014   Screening for prostate cancer 12/07/2014   Claudication of right lower extremity ([place]) 10/20/2013   Claudication in peripheral vascular disease- Rt leg 09/20/2013   Obesity (BMI 30-39.9)- negative sleep study in  the past 09/20/2013   Occlusion and stenosis of carotid artery without mention of cerebral infarction 05/23/2013   PAD (peripheral artery disease) ([place]) 05/18/2013   Carotid artery disease ([place]) 04/14/2013   Bradycardia, sinus 02/18/2013   Tobacco abuse 02/05/2013   Chest pain, unstable angina, negative MI 02/04/2013   Coronary artery disease involving native coronary artery of native heart without angina pectoris 02/04/2013   Benign essential hypertension 07/10/2011   Incisional hernia 05/22/2011    Current  Outpatient Medications:    amiodarone (PACERONE) 200 MG tablet, Take 1 tablet (200 mg total) by mouth 2 (two) times daily., Disp: 60 tablet, Rfl: 3   amLODipine (NORVASC) 10 MG tablet, Take 1 tablet (10 mg total) by mouth daily., Disp: 30 tablet, Rfl: 11   apixaban (ELIQUIS) 2.5 MG TABS tablet, Take 1 tablet (2.5 mg total) by mouth 2 (two) times daily., Disp: 60 tablet, Rfl: 3   atorvastatin (LIPITOR) 40 MG tablet, Take 1 tablet (40 mg total) by mouth daily., Disp: 30 tablet, Rfl: 11   clopidogrel (PLAVIX) 75 MG tablet, Take 1 tablet (75 mg total) by mouth daily., Disp: 30 tablet, Rfl: 11   glipiZIDE (GLUCOTROL) 5 MG tablet, Take 5 mg by mouth daily before breakfast., Disp: , Rfl:    hydrALAZINE (APRESOLINE) 100 MG tablet, Take 1 tablet (100 mg total) by mouth 3 (three) times daily., Disp: 90 tablet, Rfl: 0   isosorbide mononitrate (IMDUR) 120 MG 24 hr tablet, Take 1 tablet (120 mg total) by mouth daily., Disp: 30 tablet, Rfl: 11   levothyroxine (SYNTHROID) 25 MCG tablet, TAKE ONE TABLET BY MOUTH DAILY BEFORE BREAKFAST (Patient taking differently: Take 75 mcg by mouth daily before breakfast.), Disp: 90 tablet, Rfl: 3   Multiple Vitamin (MULTIVITAMIN WITH MINERALS) TABS tablet, Take 1 tablet by mouth daily. (Patient not taking: Reported on 10/01/2022), Disp: 30 tablet, Rfl: 0   nystatin (MYCOSTATIN/NYSTOP) powder, Apply topically 2 (two) times daily., Disp: 15 g, Rfl: 0   ondansetron (ZOFRAN-ODT) 4 MG disintegrating tablet, Take 1 tablet (4 mg total) by mouth every 8 (eight) hours as needed for up to 15 doses for nausea or vomiting. (Patient not taking: Reported on 10/01/2022), Disp: 15 tablet, Rfl: 0   pantoprazole (PROTONIX) 40 MG tablet, Take 1 tablet (40 mg total) by mouth every other day., Disp: 30 tablet, Rfl: 3 Allergies  Allergen Reactions   Fentanyl Other (See Comments)    Behavioral changes   Gabapentin Swelling   Lisinopril Other (See Comments)    Unknown reaction   Lyrica [Pregabalin]  Other (See Comments)    Unknown reaction   Metformin Diarrhea   Propofol Other (See Comments)    Heart rate dropped      Social History   Socioeconomic History   Marital status: Married    Spouse name: Meet Weathington   Number of children: 1   Years of education: Not on file   Highest education level: High school graduate  Occupational History   Occupation: retired  Tobacco Use   Smoking status: Former    Packs/day: 1.50    Years: 57.00    Total pack years: 85.50    Types: Cigarettes, E-cigarettes    Quit date: 2020    Years since quitting: 4.0   Smokeless tobacco: Never   Tobacco comments:    pt states that he is using the vapor cigs--12/2021  Vaping Use   Vaping Use: Former   Start date: 09/15/2016   Substances: Nicotine  Substance and Sexual  Activity   Alcohol use: Yes    Alcohol/week: 1.0 standard drink of alcohol    Types: 1 Glasses of wine per week    Comment: "very little"   Drug use: No   Sexual activity: Not on file  Other Topics Concern   Not on file  Social History Narrative   Not on file   Social Determinants of Health   Financial Resource Strain: Low Risk  (06/03/2022)   Overall Financial Resource Strain (CARDIA)    Difficulty of Paying Living Expenses: Not hard at all  Food Insecurity: No Food Insecurity (09/18/2022)   Hunger Vital Sign    Worried About Running Out of Food in the Last Year: Never true    Ran Out of Food in the Last Year: Never true  Transportation Needs: No Transportation Needs (09/18/2022)   PRAPARE - Administrator, Civil Service (Medical): No    Lack of Transportation (Non-Medical): No  Recent Concern: Transportation Needs - Unmet Transportation Needs (08/21/2022)   PRAPARE - Administrator, Civil Service (Medical): Yes    Lack of Transportation (Non-Medical): No  Physical Activity: Not on file  Stress: Not on file  Social Connections: Not on file  Intimate Partner Violence: Not At Risk (09/18/2022)    Humiliation, Afraid, Rape, and Kick questionnaire    Fear of Current or Ex-Partner: No    Emotionally Abused: No    Physically Abused: No    Sexually Abused: No    Physical Exam      No future appointments.     Beatrix Shipper, Paramedic-Paramedic [phone] [facility] Paramedic  10/02/22

## 2022-10-03 ENCOUNTER — Inpatient Hospital Stay (HOSPITAL_COMMUNITY)
Admission: EM | Admit: 2022-10-03 | Discharge: 2022-10-06 | DRG: 392 | Disposition: A | Discharge status: Home Health | Payer: Medicare Other | Attending: Family Medicine | Admitting: Family Medicine

## 2022-10-03 ENCOUNTER — Telehealth (HOSPITAL_COMMUNITY): Payer: Self-pay | Admitting: Emergency Medicine

## 2022-10-03 ENCOUNTER — Other Ambulatory Visit: Payer: Self-pay

## 2022-10-03 ENCOUNTER — Emergency Department (HOSPITAL_COMMUNITY): Payer: Medicare Other

## 2022-10-03 ENCOUNTER — Encounter (HOSPITAL_COMMUNITY): Payer: Self-pay

## 2022-10-03 DIAGNOSIS — I10 Essential (primary) hypertension: Secondary | ICD-10-CM | POA: Diagnosis present

## 2022-10-03 DIAGNOSIS — Z79890 Hormone replacement therapy (postmenopausal): Secondary | ICD-10-CM

## 2022-10-03 DIAGNOSIS — I2510 Atherosclerotic heart disease of native coronary artery without angina pectoris: Secondary | ICD-10-CM | POA: Diagnosis present

## 2022-10-03 DIAGNOSIS — J449 Chronic obstructive pulmonary disease, unspecified: Secondary | ICD-10-CM | POA: Diagnosis present

## 2022-10-03 DIAGNOSIS — Z955 Presence of coronary angioplasty implant and graft: Secondary | ICD-10-CM

## 2022-10-03 DIAGNOSIS — R197 Diarrhea, unspecified: Secondary | ICD-10-CM | POA: Diagnosis not present

## 2022-10-03 DIAGNOSIS — M5416 Radiculopathy, lumbar region: Secondary | ICD-10-CM | POA: Diagnosis present

## 2022-10-03 DIAGNOSIS — E1169 Type 2 diabetes mellitus with other specified complication: Secondary | ICD-10-CM | POA: Diagnosis present

## 2022-10-03 DIAGNOSIS — Z83438 Family history of other disorder of lipoprotein metabolism and other lipidemia: Secondary | ICD-10-CM

## 2022-10-03 DIAGNOSIS — K439 Ventral hernia without obstruction or gangrene: Secondary | ICD-10-CM | POA: Diagnosis present

## 2022-10-03 DIAGNOSIS — F0390 Unspecified dementia without behavioral disturbance: Secondary | ICD-10-CM | POA: Diagnosis present

## 2022-10-03 DIAGNOSIS — I4821 Permanent atrial fibrillation: Secondary | ICD-10-CM | POA: Diagnosis present

## 2022-10-03 DIAGNOSIS — K219 Gastro-esophageal reflux disease without esophagitis: Secondary | ICD-10-CM | POA: Diagnosis present

## 2022-10-03 DIAGNOSIS — K5641 Fecal impaction: Secondary | ICD-10-CM | POA: Diagnosis not present

## 2022-10-03 DIAGNOSIS — Z823 Family history of stroke: Secondary | ICD-10-CM

## 2022-10-03 DIAGNOSIS — R0902 Hypoxemia: Secondary | ICD-10-CM | POA: Diagnosis not present

## 2022-10-03 DIAGNOSIS — Z833 Family history of diabetes mellitus: Secondary | ICD-10-CM

## 2022-10-03 DIAGNOSIS — N281 Cyst of kidney, acquired: Secondary | ICD-10-CM | POA: Diagnosis present

## 2022-10-03 DIAGNOSIS — E871 Hypo-osmolality and hyponatremia: Secondary | ICD-10-CM | POA: Diagnosis present

## 2022-10-03 DIAGNOSIS — R911 Solitary pulmonary nodule: Secondary | ICD-10-CM | POA: Diagnosis present

## 2022-10-03 DIAGNOSIS — N179 Acute kidney failure, unspecified: Secondary | ICD-10-CM

## 2022-10-03 DIAGNOSIS — Z87891 Personal history of nicotine dependence: Secondary | ICD-10-CM

## 2022-10-03 DIAGNOSIS — K922 Gastrointestinal hemorrhage, unspecified: Secondary | ICD-10-CM | POA: Diagnosis not present

## 2022-10-03 DIAGNOSIS — E785 Hyperlipidemia, unspecified: Secondary | ICD-10-CM | POA: Insufficient documentation

## 2022-10-03 DIAGNOSIS — E039 Hypothyroidism, unspecified: Secondary | ICD-10-CM | POA: Diagnosis present

## 2022-10-03 DIAGNOSIS — I130 Hypertensive heart and chronic kidney disease with heart failure and stage 1 through stage 4 chronic kidney disease, or unspecified chronic kidney disease: Secondary | ICD-10-CM | POA: Diagnosis present

## 2022-10-03 DIAGNOSIS — Z7984 Long term (current) use of oral hypoglycemic drugs: Secondary | ICD-10-CM

## 2022-10-03 DIAGNOSIS — I5030 Unspecified diastolic (congestive) heart failure: Secondary | ICD-10-CM | POA: Diagnosis present

## 2022-10-03 DIAGNOSIS — K5289 Other specified noninfective gastroenteritis and colitis: Secondary | ICD-10-CM | POA: Diagnosis present

## 2022-10-03 DIAGNOSIS — E876 Hypokalemia: Secondary | ICD-10-CM | POA: Diagnosis present

## 2022-10-03 DIAGNOSIS — K625 Hemorrhage of anus and rectum: Principal | ICD-10-CM

## 2022-10-03 DIAGNOSIS — E1151 Type 2 diabetes mellitus with diabetic peripheral angiopathy without gangrene: Secondary | ICD-10-CM | POA: Diagnosis present

## 2022-10-03 DIAGNOSIS — Z7901 Long term (current) use of anticoagulants: Secondary | ICD-10-CM

## 2022-10-03 DIAGNOSIS — R109 Unspecified abdominal pain: Secondary | ICD-10-CM | POA: Diagnosis not present

## 2022-10-03 DIAGNOSIS — K6289 Other specified diseases of anus and rectum: Secondary | ICD-10-CM

## 2022-10-03 DIAGNOSIS — E1122 Type 2 diabetes mellitus with diabetic chronic kidney disease: Secondary | ICD-10-CM | POA: Diagnosis present

## 2022-10-03 DIAGNOSIS — R404 Transient alteration of awareness: Secondary | ICD-10-CM | POA: Diagnosis not present

## 2022-10-03 DIAGNOSIS — R935 Abnormal findings on diagnostic imaging of other abdominal regions, including retroperitoneum: Secondary | ICD-10-CM | POA: Diagnosis not present

## 2022-10-03 DIAGNOSIS — Z885 Allergy status to narcotic agent status: Secondary | ICD-10-CM

## 2022-10-03 DIAGNOSIS — I5032 Chronic diastolic (congestive) heart failure: Secondary | ICD-10-CM | POA: Diagnosis present

## 2022-10-03 DIAGNOSIS — Z884 Allergy status to anesthetic agent status: Secondary | ICD-10-CM

## 2022-10-03 DIAGNOSIS — Z888 Allergy status to other drugs, medicaments and biological substances status: Secondary | ICD-10-CM

## 2022-10-03 DIAGNOSIS — Z8679 Personal history of other diseases of the circulatory system: Secondary | ICD-10-CM

## 2022-10-03 DIAGNOSIS — N1832 Chronic kidney disease, stage 3b: Secondary | ICD-10-CM | POA: Insufficient documentation

## 2022-10-03 DIAGNOSIS — I480 Paroxysmal atrial fibrillation: Secondary | ICD-10-CM | POA: Diagnosis present

## 2022-10-03 DIAGNOSIS — Z79899 Other long term (current) drug therapy: Secondary | ICD-10-CM

## 2022-10-03 DIAGNOSIS — K5900 Constipation, unspecified: Secondary | ICD-10-CM | POA: Insufficient documentation

## 2022-10-03 DIAGNOSIS — R110 Nausea: Secondary | ICD-10-CM | POA: Diagnosis not present

## 2022-10-03 DIAGNOSIS — Z9049 Acquired absence of other specified parts of digestive tract: Secondary | ICD-10-CM

## 2022-10-03 DIAGNOSIS — Z825 Family history of asthma and other chronic lower respiratory diseases: Secondary | ICD-10-CM

## 2022-10-03 DIAGNOSIS — Z83719 Family history of colon polyps, unspecified: Secondary | ICD-10-CM

## 2022-10-03 DIAGNOSIS — Z8249 Family history of ischemic heart disease and other diseases of the circulatory system: Secondary | ICD-10-CM

## 2022-10-03 DIAGNOSIS — Z7902 Long term (current) use of antithrombotics/antiplatelets: Secondary | ICD-10-CM

## 2022-10-03 LAB — RENAL FUNCTION PANEL
Albumin: 3.5 g/dL (ref 3.5–5.0)
Anion gap: 11 (ref 5–15)
BUN: 12 mg/dL (ref 8–23)
CO2: 23 mmol/L (ref 22–32)
Calcium: 8.4 mg/dL — ABNORMAL LOW (ref 8.9–10.3)
Chloride: 98 mmol/L (ref 98–111)
Creatinine, Ser: 1.4 mg/dL — ABNORMAL HIGH (ref 0.61–1.24)
GFR, Estimated: 51 mL/min — ABNORMAL LOW (ref >60)
Glucose, Bld: 93 mg/dL (ref 70–99)
Phosphorus: 2.7 mg/dL (ref 2.5–4.6)
Potassium: 2.5 mmol/L — CL (ref 3.5–5.1)
Sodium: 132 mmol/L — ABNORMAL LOW (ref 135–145)

## 2022-10-03 LAB — BASIC METABOLIC PANEL
Anion gap: 16 — ABNORMAL HIGH (ref 5–15)
BUN: 14 mg/dL (ref 8–23)
CO2: 20 mmol/L — ABNORMAL LOW (ref 22–32)
Calcium: 9 mg/dL (ref 8.9–10.3)
Chloride: 94 mmol/L — ABNORMAL LOW (ref 98–111)
Creatinine, Ser: 1.68 mg/dL — ABNORMAL HIGH (ref 0.61–1.24)
GFR, Estimated: 41 mL/min — ABNORMAL LOW (ref >60)
Glucose, Bld: 105 mg/dL — ABNORMAL HIGH (ref 70–99)
Potassium: 2.4 mmol/L — CL (ref 3.5–5.1)
Sodium: 130 mmol/L — ABNORMAL LOW (ref 135–145)

## 2022-10-03 LAB — CBG MONITORING, ED: Glucose-Capillary: 89 mg/dL (ref 70–99)

## 2022-10-03 LAB — CBC
HCT: 41.4 % (ref 39.0–52.0)
Hemoglobin: 14.6 g/dL (ref 13.0–17.0)
MCH: 29.7 pg (ref 26.0–34.0)
MCHC: 35.3 g/dL (ref 30.0–36.0)
MCV: 84.3 fL (ref 80.0–100.0)
Platelets: 270 10*3/uL (ref 150–400)
RBC: 4.91 MIL/uL (ref 4.22–5.81)
RDW: 16 % — ABNORMAL HIGH (ref 11.5–15.5)
WBC: 11 10*3/uL — ABNORMAL HIGH (ref 4.0–10.5)
nRBC: 0 % (ref 0.0–0.2)

## 2022-10-03 LAB — TYPE AND SCREEN
ABO/RH(D): O POS
Antibody Screen: NEGATIVE

## 2022-10-03 LAB — GLUCOSE, CAPILLARY
Glucose-Capillary: 118 mg/dL — ABNORMAL HIGH (ref 70–99)
Glucose-Capillary: 100 mg/dL — ABNORMAL HIGH (ref 70–99)
Glucose-Capillary: 137 mg/dL — ABNORMAL HIGH (ref 70–99)

## 2022-10-03 LAB — HEMOGLOBIN AND HEMATOCRIT, BLOOD
HCT: 39.6 % (ref 39.0–52.0)
HCT: 41.3 % (ref 39.0–52.0)
Hemoglobin: 13.7 g/dL (ref 13.0–17.0)
Hemoglobin: 14.4 g/dL (ref 13.0–17.0)

## 2022-10-03 LAB — MAGNESIUM: Magnesium: 2 mg/dL (ref 1.7–2.4)

## 2022-10-03 LAB — POC OCCULT BLOOD, ED: Fecal Occult Bld: POSITIVE — AB

## 2022-10-03 MED ORDER — PROCHLORPERAZINE EDISYLATE 10 MG/2ML IJ SOLN: 10.0000 mg | Freq: Four times a day (QID) | INTRAMUSCULAR | Status: DC | PRN

## 2022-10-03 MED ORDER — ACETAMINOPHEN 650 MG RE SUPP: 650.0000 mg | Freq: Four times a day (QID) | RECTAL | Status: DC | PRN

## 2022-10-03 MED ORDER — POTASSIUM CHLORIDE CRYS ER 20 MEQ PO TBCR
40.0000 meq | EXTENDED_RELEASE_TABLET | Freq: Once | ORAL | Status: AC
  Administered 2022-10-03: 40 meq via ORAL
  Filled 2022-10-03: qty 2

## 2022-10-03 MED ORDER — POTASSIUM CHLORIDE 10 MEQ/100ML IV SOLN
10.0000 meq | INTRAVENOUS | Status: AC
  Administered 2022-10-03 (×2): 10 meq via INTRAVENOUS
  Filled 2022-10-03 (×2): qty 100

## 2022-10-03 MED ORDER — POTASSIUM CHLORIDE CRYS ER 20 MEQ PO TBCR
40.0000 meq | EXTENDED_RELEASE_TABLET | Freq: Two times a day (BID) | ORAL | Status: DC
  Filled 2022-10-03: qty 2

## 2022-10-03 MED ORDER — OXYCODONE HCL 5 MG PO TABS: 5.0000 mg | ORAL_TABLET | ORAL | Status: DC | PRN

## 2022-10-03 MED ORDER — AMIODARONE HCL 200 MG PO TABS
200.0000 mg | ORAL_TABLET | Freq: Two times a day (BID) | ORAL | Status: DC
  Administered 2022-10-03 – 2022-10-06 (×6): 200 mg via ORAL
  Filled 2022-10-03 (×6): qty 1

## 2022-10-03 MED ORDER — POTASSIUM CHLORIDE 10 MEQ/100ML IV SOLN
10.0000 meq | INTRAVENOUS | Status: AC
  Administered 2022-10-03 – 2022-10-04 (×3): 10 meq via INTRAVENOUS
  Filled 2022-10-03 (×3): qty 100

## 2022-10-03 MED ORDER — AMLODIPINE BESYLATE 10 MG PO TABS
10.0000 mg | ORAL_TABLET | Freq: Every day | ORAL | Status: DC
  Administered 2022-10-03 – 2022-10-06 (×4): 10 mg via ORAL
  Filled 2022-10-03 (×2): qty 1
  Filled 2022-10-03: qty 2
  Filled 2022-10-03: qty 1

## 2022-10-03 MED ORDER — LEVOTHYROXINE SODIUM 75 MCG PO TABS
75.0000 ug | ORAL_TABLET | Freq: Every day | ORAL | Status: DC
  Administered 2022-10-04 – 2022-10-06 (×3): 75 ug via ORAL
  Filled 2022-10-03 (×3): qty 1

## 2022-10-03 MED ORDER — METOPROLOL TARTRATE 5 MG/5ML IV SOLN
5.0000 mg | Freq: Four times a day (QID) | INTRAVENOUS | Status: DC | PRN
  Administered 2022-10-03 – 2022-10-06 (×3): 5 mg via INTRAVENOUS
  Filled 2022-10-03 (×3): qty 5

## 2022-10-03 MED ORDER — HYDRALAZINE HCL 50 MG PO TABS
100.0000 mg | ORAL_TABLET | Freq: Three times a day (TID) | ORAL | Status: DC
  Administered 2022-10-03 – 2022-10-06 (×10): 100 mg via ORAL
  Filled 2022-10-03 (×10): qty 2

## 2022-10-03 MED ORDER — ACETAMINOPHEN 325 MG PO TABS: 650.0000 mg | ORAL_TABLET | Freq: Four times a day (QID) | ORAL | Status: DC | PRN

## 2022-10-03 MED ORDER — ATORVASTATIN CALCIUM 40 MG PO TABS
40.0000 mg | ORAL_TABLET | Freq: Every day | ORAL | Status: DC
  Administered 2022-10-04 – 2022-10-06 (×3): 40 mg via ORAL
  Filled 2022-10-03 (×3): qty 1

## 2022-10-03 MED ORDER — FLEET ENEMA 7-19 GM/118ML RE ENEM
1.0000 | ENEMA | Freq: Once | RECTAL | Status: DC
  Filled 2022-10-03: qty 1

## 2022-10-03 MED ORDER — INSULIN ASPART 100 UNIT/ML IJ SOLN
0.0000 [IU] | Freq: Three times a day (TID) | INTRAMUSCULAR | Status: DC
  Administered 2022-10-04: 2 [IU] via SUBCUTANEOUS
  Administered 2022-10-04 (×2): 1 [IU] via SUBCUTANEOUS
  Administered 2022-10-05: 2 [IU] via SUBCUTANEOUS
  Administered 2022-10-05 – 2022-10-06 (×3): 1 [IU] via SUBCUTANEOUS
  Administered 2022-10-06: 2 [IU] via SUBCUTANEOUS
  Filled 2022-10-03: qty 0.09

## 2022-10-03 MED ORDER — DOCUSATE SODIUM 100 MG PO CAPS
100.0000 mg | ORAL_CAPSULE | Freq: Two times a day (BID) | ORAL | Status: DC
  Administered 2022-10-04 – 2022-10-06 (×5): 100 mg via ORAL
  Filled 2022-10-03 (×6): qty 1

## 2022-10-03 MED ORDER — ISOSORBIDE MONONITRATE ER 60 MG PO TB24
120.0000 mg | ORAL_TABLET | Freq: Every day | ORAL | Status: DC
  Administered 2022-10-03 – 2022-10-06 (×4): 120 mg via ORAL
  Filled 2022-10-03 (×4): qty 2

## 2022-10-03 MED ORDER — SODIUM CHLORIDE 0.9 % IV SOLN
2.0000 g | INTRAVENOUS | Status: DC
  Administered 2022-10-03: 2 g via INTRAVENOUS
  Filled 2022-10-03: qty 20

## 2022-10-03 MED ORDER — SODIUM CHLORIDE 0.9 % IV SOLN: Freq: Once | INTRAVENOUS | Status: AC

## 2022-10-03 MED ORDER — POTASSIUM CHLORIDE CRYS ER 20 MEQ PO TBCR
40.0000 meq | EXTENDED_RELEASE_TABLET | Freq: Once | ORAL | Status: AC
  Administered 2022-10-03: 40 meq via ORAL

## 2022-10-03 MED ORDER — METRONIDAZOLE 500 MG/100ML IV SOLN
500.0000 mg | Freq: Two times a day (BID) | INTRAVENOUS | Status: DC
  Administered 2022-10-03 – 2022-10-04 (×2): 500 mg via INTRAVENOUS
  Filled 2022-10-03 (×2): qty 100

## 2022-10-03 MED ORDER — POLYETHYLENE GLYCOL 3350 17 G PO PACK: 17.0000 g | PACK | Freq: Every day | ORAL | Status: DC | PRN

## 2022-10-03 NOTE — Plan of Care (Signed)
  Problem: Coping: Goal: Ability to adjust to condition or change in health will improve Outcome: Progressing   Problem: Safety: Goal: Ability to remain free from injury will improve Outcome: Progressing   

## 2022-10-03 NOTE — Consult Note (Signed)
Referring Provider: [facility] Primary Care Physician:  Patient, No Pcp Per Primary Gastroenterologist: Unassigned  Reason for Consultation: GI bleed  HPI: Albert Powell is a 82 y.o. male with past medical history of coronary artery disease currently on Plavix, history of paroxysmal atrial fibrillation on Eliquis and amiodarone, dementia, history of CHF history of chronic kidney disease and diabetes presented to the hospital with nausea and blood in the stool.  Was recently discharged from hospital few days ago after being treated for AKI and possible infectious gastroenteritis.  Blood work today showed normal hemoglobin at 14.6, mildly elevated white count at 11 which are stable compared to last admission, potassium 2.4, creatinine 1.68.  Occult blood was positive.  GI is consulted for further evaluation.  Patient seen and examined at bedside.  According to patient, he usually have loose stool since colectomy.  He came in with some blood in the stool and weakness.    CT abdomen pelvis without contrast showed prior colectomy with ileorectal anastomosis and mild rectal wall thickening concerning for proctitis.  Also showed ventral abdominal wall hernia containing loops of small bowel.  CT scan on September 18, 2022 showed moderate amount of stool in the rectum and evidence of colectomy with ileorectal anastomosis.  Past Medical History:  Diagnosis Date   Atrial fibrillation ([place]) 03/30/2020   Bradycardia, sinus 02/18/2013   CAD (coronary artery disease)    stent to RCA 2003 also had 40% lesion at that time; NUCLEAR STRESS TEST, 01/16/2010 - normal  now with cath 80-90% stenosis in LAD, will try medical therapy if no improvement PCI   Carotid artery disease ([name])    Cataract    CHF (congestive heart failure) ([place])    Chronic kidney disease (CKD), stage III (moderate) ([place])    Claudication ([place])    LEA DUPLEX, 07/07/2008 - Normal   COPD (chronic obstructive pulmonary disease) ([name])    DDD  (degenerative disc disease) 2008   Diabetes mellitus    GERD (gastroesophageal reflux disease)    H/O hiatal hernia    History of colonoscopy 2004   finding of tics and AMV only no polpys   Hyperlipidemia 02/05/2013   Hypertension    Hypothyroidism    Obesity    Onychomycosis    PAD (peripheral artery disease) ([name])    Rosacea    Tobacco abuse 02/05/2013    Past Surgical History:  Procedure Laterality Date   ABDOMINAL AORTOGRAM W/LOWER EXTREMITY N/A 03/30/2018   Procedure: ABDOMINAL AORTOGRAM W/LOWER EXTREMITY;  Surgeon: Serafina Mitchell, MD;  Location: [place] [facility];  Service: Cardiovascular;  Laterality: N/A;   ABDOMINAL AORTOGRAM W/LOWER EXTREMITY Bilateral 08/27/2020   Procedure: ABDOMINAL AORTOGRAM W/LOWER EXTREMITY;  Surgeon: Lorretta Harp, MD;  Location: [place] [facility];  Service: Cardiovascular;  Laterality: Bilateral;   APPENDECTOMY     CARDIAC CATHETERIZATION  08/29/2002   2-vessel CAD with high-grade stenoses in RCA; stenting to RCA   CARDIAC CATHETERIZATION  2014   80-90% lesion will try medical therapy   CARDIAC SURGERY     stents  2003   CARDIOVERSION N/A 11/01/2013   Procedure: [place] COMPRESSION;  Surgeon: Lorretta Harp, MD;  Location: [facility] CATH LAB;  Service: Cardiovascular;  Laterality: N/A;   CAROTID ANGIOGRAM N/A 04/25/2013   Procedure: CAROTID ANGIOGRAM;  Surgeon: Lorretta Harp, MD;  Location: [facility] CATH LAB;  Service: Cardiovascular;  Laterality: N/A;   CAROTID ENDARTERECTOMY     COLON RESECTION  3/04, 6/04  1 bleeding diverticulitis, 2 complete colectomy   COLON SURGERY  2005   renal pouch rectal anastimosis   CORONARY ANGIOPLASTY WITH STENT PLACEMENT  09/2002   2 stents to RCA   ENDARTERECTOMY Left 06/02/2013   Procedure: ENDARTERECTOMY CAROTID-LEFT;  Surgeon: Nada Libman, MD;  Location: [facility] OR;  Service: Vascular;  Laterality: Left;   INTRAVASCULAR PRESSURE WIRE/FFR STUDY N/A 04/02/2020   Procedure: INTRAVASCULAR PRESSURE  WIRE/FFR STUDY;  Surgeon: [name], MD;  Location: [facility] INVASIVE CV LAB;  Service: Cardiovascular;  Laterality: N/A;  DFR - RCA   LEFT HEART CATH AND CORONARY ANGIOGRAPHY N/A 04/02/2020   Procedure: LEFT HEART CATH AND CORONARY ANGIOGRAPHY;  Surgeon: [name], MD;  Location: [facility] INVAS[facility] CV [facility];  Service: Cardiovascular;  Laterality: N/A;   LEFT HEART CATHETERIZATION WITH CORONARY ANGIOGRAM N/A 02/08/2013   Procedure: LEFT HEART CATHETERIZATION WITH CORONARY ANGIOGRAM;  Surgeon: [name], MD;  Location: [facility] CATH LAB;  Service: Cardiovascular;  Laterality: N/A;   LOWER EXTREMITY ANGIOGRAM N/A 10/20/2013   Procedure: LOWER EXTREMITY ANGIOGRAM;  Surgeon: [name], MD;  Location: [facility] CATH LAB;  Service: Cardiovascular;  Laterality: N/A;   PATCH ANGIOPLASTY Left 06/02/2013   Procedure: PATCH ANGIOPLASTY;  Surgeon: Nada Libman, MD;  Location: [facility] OR;  Service: Vascular;  Laterality: Left;   PERIPHERAL VASCULAR INTERVENTION Bilateral 03/30/2018   Procedure: PERIPHERAL VASCULAR INTERVENTION;  Surgeon: Nada Libman, MD;  Location: [facility] INVAS[facility] LAB;  Service: Cardiovascular;  Laterality: Bilateral;  common iliacs   PRESSURE SENSOR/CARDIOMEMS N/A 07/02/2022   Procedure: PRESSURE SENSOR/CARDIOMEMS;  Surgeon: Laurey Morale, MD;  Location: [facility] INVASIVE CV LAB;  Service: Cardiovascular;  Laterality: N/A;   RIGHT HEART CATH N/A 07/02/2022   Procedure: RIGHT HEART CATH;  Surgeon: Laurey Morale, MD;  Location: [facility] INVAS[facility] CV LAB;  Service: Cardiovascular;  Laterality: N/A;   RIGHT/LEFT HEART CATH AND CORONARY ANGIOGRAPHY N/A 05/20/2022   Procedure: RIGHT/LEFT HEART CATH AND CORONARY ANGIOGRAPHY;  Surgeon: Orbie Pyo, MD;  Location: [facility] INVASIVE CV LAB;  Service: Cardiovascular;  Laterality: N/A;   TONSILLECTOMY      Prior to Admission medications   Medication Sig Start Date End Date Taking? Authorizing Provider  amiodarone (PACERONE) 200 MG tablet Take 1 tablet (200 mg  total) by mouth 2 (two) times daily. 08/05/22   Laurey Morale, MD  amLODipine (NORVASC) 10 MG tablet Take 1 tablet (10 mg total) by mouth daily. 08/05/22 08/05/23  Laurey Morale, MD  apixaban (ELIQUIS) 2.5 MG TABS tablet Take 1 tablet (2.5 mg total) by mouth 2 (two) times daily. 08/05/22   Laurey Morale, MD  atorvastatin (LIPITOR) 40 MG tablet Take 1 tablet (40 mg total) by mouth daily. 08/15/22   Laurey Morale, MD  clopidogrel (PLAVIX) 75 MG tablet Take 1 tablet (75 mg total) by mouth daily. 08/15/22   Laurey Morale, MD  glipiZIDE (GLUCOTROL) 5 MG tablet Take 5 mg by mouth daily before breakfast.    [provider]  hydrALAZINE (APRESOLINE) 100 MG tablet Take 1 tablet (100 mg total) by mouth 3 (three) times daily. 09/25/22 10/25/22  Regalado, Jon Billings A, MD  isosorbide mononitrate (IMDUR) 120 MG 24 hr tablet Take 1 tablet (120 mg total) by mouth daily. 08/15/22   Laurey Morale, MD  levothyroxine (SYNTHROID) 25 MCG tablet TAKE ONE TABLET BY MOUTH DAILY BEFORE BREAKFAST Patient taking differently: Take 75 mcg by mouth daily before breakfast. 03/13/22   [name],  MD  Multiple Vitamin (MULTIVITAMIN WITH MINERALS) TABS tablet Take 1 tablet by mouth daily. Patient not taking: Reported on 10/01/2022 09/25/22   Regalado, Jerald Kief A, MD  nystatin (MYCOSTATIN/NYSTOP) powder Apply topically 2 (two) times daily. 09/25/22   Regalado, Belkys A, MD  ondansetron (ZOFRAN-ODT) 4 MG disintegrating tablet Take 1 tablet (4 mg total) by mouth every 8 (eight) hours as needed for up to 15 doses for nausea or vomiting. Patient not taking: Reported on 10/01/2022 08/28/22   Wyvonnia Dusky, MD  pantoprazole (PROTONIX) 40 MG tablet Take 1 tablet (40 mg total) by mouth every other day. 07/30/22   Lyda Jester M, PA-C    Scheduled Meds:  amiodarone  200 mg Oral BID   amLODipine  10 mg Oral Daily   docusate sodium  100 mg Oral BID   hydrALAZINE  100 mg Oral TID   insulin aspart  0-9 Units  Subcutaneous TID WC   isosorbide mononitrate  120 mg Oral Daily   potassium chloride  40 mEq Oral BID   Continuous Infusions:  sodium chloride     cefTRIAXone (ROCEPHIN)  IV 2 g (10/03/22 1236)   metronidazole 500 mg (10/03/22 1239)   PRN Meds:.metoprolol tartrate, prochlorperazine  Allergies as of 10/03/2022 - Review Complete 10/03/2022  Allergen Reaction Noted   Fentanyl Other (See Comments) 05/07/2011   Gabapentin Swelling 05/07/2011   Lisinopril Other (See Comments) 05/07/2011   Lyrica [pregabalin] Other (See Comments) 05/07/2011   Metformin Diarrhea 04/09/2017   Propofol Other (See Comments) 01/15/2012    Family History  Problem Relation Age of Onset   Heart disease Father        heart attack at 104   Heart attack Father    Hyperlipidemia Father    Colonic polyp Mother        that bled out   COPD Mother    Diabetes Mother    Heart disease Sister    Colonic polyp Sister    Stroke Maternal Grandfather    Heart attack Paternal Grandfather    Other Neg Hx        hypogonadism    Social History   Socioeconomic History   Marital status: Married    Spouse name: Thaison Kolodziejski   Number of children: 1   Years of education: Not on file   Highest education level: High school graduate  Occupational History   Occupation: retired  Tobacco Use   Smoking status: Former    Packs/day: 1.50    Years: 57.00    Total pack years: 85.50    Types: Cigarettes, E-cigarettes    Quit date: 2020    Years since quitting: 4.0   Smokeless tobacco: Never   Tobacco comments:    pt states that he is using the vapor cigs--12/2021  Vaping Use   Vaping Use: Former   Start date: 09/15/2016   Substances: Nicotine  Substance and Sexual Activity   Alcohol use: Yes    Alcohol/week: 1.0 standard drink of alcohol    Types: 1 Glasses of wine per week    Comment: "very little"   Drug use: No   Sexual activity: Not on file  Other Topics Concern   Not on file  Social History Narrative    Not on file   Social Determinants of Health   Financial Resource Strain: Low Risk  (06/03/2022)   Overall Financial Resource Strain (CARDIA)    Difficulty of Paying Living Expenses: Not hard at all  Food Insecurity: No Food  Insecurity (09/18/2022)   Hunger Vital Sign    Worried About Running Out of Food in the Last Year: Never true    Ran Out of Food in the Last Year: Never true  Transportation Needs: No Transportation Needs (09/18/2022)   PRAPARE - Administrator, Civil Service (Medical): No    Lack of Transportation (Non-Medical): No  Recent Concern: Transportation Needs - Unmet Transportation Needs (08/21/2022)   PRAPARE - Administrator, Civil Service (Medical): Yes    Lack of Transportation (Non-Medical): No  Physical Activity: Not on file  Stress: Not on file  Social Connections: Not on file  Intimate Partner Violence: Not At Risk (09/18/2022)   Humiliation, Afraid, Rape, and Kick questionnaire    Fear of Current or Ex-Partner: No    Emotionally Abused: No    Physically Abused: No    Sexually Abused: No    Review of Systems: All negative except as stated above in HPI.  Physical Exam: Vital signs: Vitals:   10/03/22 1005 10/03/22 1135  BP: (!) 154/86   Pulse: 89 73  Resp: 20 18  Temp: 98 F (36.7 C) 98.2 F (36.8 C)  SpO2: 100% 97%     General:   Alert,  Well-developed, well-nourished, pleasant and cooperative in NAD Lungs: Mild shortness of breath noted on inspection. Heart:  Regular rate and rhythm; no murmurs, clicks, rubs,  or gallops. Abdomen: Mild abdominal distention and discomfort on palpation particularly bilateral lower quadrant, abdomen is otherwise nontender, no peritoneal signs.  Bowel sounds present. Rectal:  Deferred  GI:  Lab Results: Recent Labs    10/03/22 0814  WBC 11.0*  HGB 14.6  HCT 41.4  PLT 270   BMET Recent Labs    10/03/22 0814  NA 130*  K 2.4*  CL 94*  CO2 20*  GLUCOSE 105*  BUN 14  CREATININE 1.68*   CALCIUM 9.0   LFT No results for input(s): "PROT", "ALBUMIN", "AST", "ALT", "ALKPHOS", "BILITOT", "BILIDIR", "IBILI" in the last 72 hours. PT/INR No results for input(s): "LABPROT", "INR" in the last 72 hours.   Studies/Results: CT ABDOMEN PELVIS WO CONTRAST  Result Date: 10/03/2022 CLINICAL DATA:  Abdominal pain EXAM: CT ABDOMEN AND PELVIS WITHOUT CONTRAST TECHNIQUE: Multidetector CT imaging of the abdomen and pelvis was performed following the standard protocol without IV contrast. RADIATION DOSE REDUCTION: This exam was performed according to the departmental dose-optimization program which includes automated exposure control, adjustment of the mA and/or kV according to patient size and/or use of iterative reconstruction technique. COMPARISON:  CT abdomen and pelvis dated September 18, 2022 FINDINGS: Lower chest: Solid pulmonary nodule of the right lower lobe measuring 5 mm on series 4, image 20, unchanged when compared with recent prior. Small hiatal hernia. Hepatobiliary: No focal liver abnormality. Hyperdense material seen in the gallbladder, likely due to sludge. No biliary ductal dilation. Pancreas: Unremarkable. No pancreatic ductal dilatation or surrounding inflammatory changes. Spleen: Normal in size without focal abnormality. Adrenals/Urinary Tract: Stable low-attenuation left adrenal gland nodule likely benign adenoma with no further follow-up imaging recommended. No hydronephrosis or nephrolithiasis. Atrophic left kidney. Unchanged bilateral renal cysts, anterior right renal cyst is hyperdense and likely proteinaceous, no specific follow-up imaging is recommended. Bladder is unremarkable. Stomach/Bowel: Stomach is within normal limits. Prior colectomy with ileorectal anastomosis. Mild rectal wall thickening. No evidence of obstruction. Vascular/Lymphatic: Aortic atherosclerosis. No enlarged abdominal or pelvic lymph nodes. Reproductive: Prostate is unremarkable. Other: Ventral abdominal wall  hernia containing a nondilated loop  of small bowel. No abdominopelvic ascites. Musculoskeletal: No acute or significant osseous findings. IMPRESSION: 1. No acute findings in the abdomen or pelvis. 2. Mild rectal wall thickening, concerning for proctitis. 3. Ventral abdominal wall hernia containing a nondilated loop of small bowel. 4. Irregular solid pulmonary nodule of the right lower lobe measuring 5 mm. Although likely benign, if the patient is high-risk, given the morphology a non-contrast chest CT can be considered in 12 months.This recommendation follows the consensus statement: Guidelines for Management of Incidental Pulmonary Nodules Detected on CT Images: From the Fleischner Society 2017; Radiology 2017; 284:228-243. 5. Aortic Atherosclerosis (ICD10-I70.0). Electronically Signed   By: Allegra Lai M.D.   On: 10/03/2022 10:34    Impression/Plan: -Rectal bleeding in a patient in setting of use of Eliquis and Plavix.  CT scan showed evidence of prior colectomy and ileal rectal anastomosis with rectal wall thickening.  Rectal exam by ED physician showed large stool ball concerning for fecal impaction. -History of atrial fibrillation.  Was on Eliquis. -Coronary artery disease.  Was on Plavix. -Dyspnea on exertion  Recommendations -------------------------- -Recommend conservative management for now.  Patient declined rectal exam to me because it was done by ED physician and he was recently cleaned by staff. -If persistent bleeding, may benefit from flexible sigmoidoscopy but will need to wait at least for 48 hours because of his anticoagulation/antiplatelet use. -Meanwhile continue fleets enema once a day for possible fecal impaction. -Okay to have full liquid diet from GI standpoint.  GI will follow    LOS: 0 days   Kathi Der  MD, FACP 10/03/2022, 1:14 PM  Contact #  [phone]

## 2022-10-03 NOTE — ED Provider Notes (Signed)
[facility] DEPT Provider Note   CSN: [id number] Arrival date & time: 10/03/22  3016     History  Chief Complaint  Patient presents with   Rectal Bleeding    Albert Powell is a 82 y.o. male.   Rectal Bleeding    Patient has a history of chest pain coronary artery disease, peripheral arterial disease, lumbar radiculopathy, atrial fibrillation, hypertension, CHF who presents to the ED for evaluation of bloody stool and nausea.  Patient tells me that the symptoms started this morning.  EMS report indicates that started last night.  Patient states he is having pain and rectal pressure.  He feels like he needs to have a bowel movement.  Tried to have 1 this morning and noticed blood in his stool.  He denies any hematemesis.  He is not having abdominal pain.  Home Medications Prior to Admission medications   Medication Sig Start Date End Date Taking? Authorizing Provider  amiodarone (PACERONE) 200 MG tablet Take 1 tablet (200 mg total) by mouth 2 (two) times daily. 08/05/22   Larey Dresser, MD  amLODipine (NORVASC) 10 MG tablet Take 1 tablet (10 mg total) by mouth daily. 08/05/22 08/05/23  Larey Dresser, MD  apixaban (ELIQUIS) 2.5 MG TABS tablet Take 1 tablet (2.5 mg total) by mouth 2 (two) times daily. 08/05/22   Larey Dresser, MD  atorvastatin (LIPITOR) 40 MG tablet Take 1 tablet (40 mg total) by mouth daily. 08/15/22   Larey Dresser, MD  clopidogrel (PLAVIX) 75 MG tablet Take 1 tablet (75 mg total) by mouth daily. 08/15/22   Larey Dresser, MD  glipiZIDE (GLUCOTROL) 5 MG tablet Take 5 mg by mouth daily before breakfast.    [provider]  hydrALAZINE (APRESOLINE) 100 MG tablet Take 1 tablet (100 mg total) by mouth 3 (three) times daily. 09/25/22 10/25/22  Regalado, Jerald Kief A, MD  isosorbide mononitrate (IMDUR) 120 MG 24 hr tablet Take 1 tablet (120 mg total) by mouth daily. 08/15/22   Larey Dresser, MD  levothyroxine (SYNTHROID) 25  MCG tablet TAKE ONE TABLET BY MOUTH DAILY BEFORE BREAKFAST Patient taking differently: Take 75 mcg by mouth daily before breakfast. 03/13/22   Lorretta Harp, MD  Multiple Vitamin (MULTIVITAMIN WITH MINERALS) TABS tablet Take 1 tablet by mouth daily. Patient not taking: Reported on 10/01/2022 09/25/22   Regalado, Jerald Kief A, MD  nystatin (MYCOSTATIN/NYSTOP) powder Apply topically 2 (two) times daily. 09/25/22   Regalado, Belkys A, MD  ondansetron (ZOFRAN-ODT) 4 MG disintegrating tablet Take 1 tablet (4 mg total) by mouth every 8 (eight) hours as needed for up to 15 doses for nausea or vomiting. Patient not taking: Reported on 10/01/2022 08/28/22   Wyvonnia Dusky, MD  pantoprazole (PROTONIX) 40 MG tablet Take 1 tablet (40 mg total) by mouth every other day. 07/30/22   Consuelo Pandy, PA-C      Allergies    Fentanyl, Gabapentin, Lisinopril, Lyrica [pregabalin], Metformin, and Propofol    Review of Systems   Review of Systems  Gastrointestinal:  Positive for hematochezia.    Physical Exam Updated Vital Signs BP (!) 154/86 (BP Location: Left Arm)   Pulse 73   Temp 98.2 F (36.8 C)   Resp 18   SpO2 97%  Physical Exam Vitals and nursing note reviewed.  Constitutional:      Appearance: He is well-developed.     Comments: Appears in pain  HENT:     Head: Normocephalic and  atraumatic.     Right Ear: External ear normal.     Left Ear: External ear normal.  Eyes:     General: No scleral icterus.       Right eye: No discharge.        Left eye: No discharge.     Conjunctiva/sclera: Conjunctivae normal.  Neck:     Trachea: No tracheal deviation.  Cardiovascular:     Rate and Rhythm: Normal rate and regular rhythm.  Pulmonary:     Effort: Pulmonary effort is normal. No respiratory distress.     Breath sounds: Normal breath sounds. No stridor. No wheezing or rales.  Abdominal:     General: Bowel sounds are normal. There is no distension.     Palpations: Abdomen is soft.      Tenderness: There is no abdominal tenderness. There is no guarding or rebound.  Genitourinary:    Comments: No blood noted on rectal exam, large soft stool ball palpated on rectal exam Musculoskeletal:        General: No tenderness or deformity.     Cervical back: Neck supple.  Skin:    General: Skin is warm and dry.     Findings: No rash.  Neurological:     General: No focal deficit present.     Mental Status: He is alert.     Cranial Nerves: No cranial nerve deficit, dysarthria or facial asymmetry.     Sensory: No sensory deficit.     Motor: No abnormal muscle tone or seizure activity.     Coordination: Coordination normal.     Comments: , Tremulous  Psychiatric:        Mood and Affect: Mood normal.     ED Results / Procedures / Treatments   Labs (all labs ordered are listed, but only abnormal results are displayed) Labs Reviewed  CBC - Abnormal; Notable for the following components:      Result Value   WBC 11.0 (*)    RDW 16.0 (*)    All other components within normal limits  BASIC METABOLIC PANEL - Abnormal; Notable for the following components:   Sodium 130 (*)    Potassium 2.4 (*)    Chloride 94 (*)    CO2 20 (*)    Glucose, Bld 105 (*)    Creatinine, Ser 1.68 (*)    GFR, Estimated 41 (*)    Anion gap 16 (*)    All other components within normal limits  POC OCCULT BLOOD, ED - Abnormal; Notable for the following components:   Fecal Occult Bld POSITIVE (*)    All other components within normal limits  MAGNESIUM  HEMOGLOBIN AND HEMATOCRIT, BLOOD  HEMOGLOBIN AND HEMATOCRIT, BLOOD  HEMOGLOBIN AND HEMATOCRIT, BLOOD  CBG MONITORING, ED  TYPE AND SCREEN    EKG EKG Interpretation  Date/Time:  Friday October 03 2022 10:03:27 EST Ventricular Rate:  75 PR Interval:    QRS Duration: 116 QT Interval:  576 QTC Calculation: 635 R Axis:   234 Text Interpretation: Atrial fibrillation Nonspecific intraventricular conduction delay Borderline low voltage, extremity  leads Nonspecific repol abnormality, lateral leads a fib new since last tracing Confirmed by Dorie Rank [phone]) on 10/03/2022 10:49:49 AM  Radiology CT ABDOMEN PELVIS WO CONTRAST  Result Date: 10/03/2022 CLINICAL DATA:  Abdominal pain EXAM: CT ABDOMEN AND PELVIS WITHOUT CONTRAST TECHNIQUE: Multidetector CT imaging of the abdomen and pelvis was performed following the standard protocol without IV contrast. RADIATION DOSE REDUCTION: This exam was performed according to  the departmental dose-optimization program which includes automated exposure control, adjustment of the mA and/or kV according to patient size and/or use of iterative reconstruction technique. COMPARISON:  CT abdomen and pelvis dated September 18, 2022 FINDINGS: Lower chest: Solid pulmonary nodule of the right lower lobe measuring 5 mm on series 4, image 20, unchanged when compared with recent prior. Small hiatal hernia. Hepatobiliary: No focal liver abnormality. Hyperdense material seen in the gallbladder, likely due to sludge. No biliary ductal dilation. Pancreas: Unremarkable. No pancreatic ductal dilatation or surrounding inflammatory changes. Spleen: Normal in size without focal abnormality. Adrenals/Urinary Tract: Stable low-attenuation left adrenal gland nodule likely benign adenoma with no further follow-up imaging recommended. No hydronephrosis or nephrolithiasis. Atrophic left kidney. Unchanged bilateral renal cysts, anterior right renal cyst is hyperdense and likely proteinaceous, no specific follow-up imaging is recommended. Bladder is unremarkable. Stomach/Bowel: Stomach is within normal limits. Prior colectomy with ileorectal anastomosis. Mild rectal wall thickening. No evidence of obstruction. Vascular/Lymphatic: Aortic atherosclerosis. No enlarged abdominal or pelvic lymph nodes. Reproductive: Prostate is unremarkable. Other: Ventral abdominal wall hernia containing a nondilated loop of small bowel. No abdominopelvic ascites.  Musculoskeletal: No acute or significant osseous findings. IMPRESSION: 1. No acute findings in the abdomen or pelvis. 2. Mild rectal wall thickening, concerning for proctitis. 3. Ventral abdominal wall hernia containing a nondilated loop of small bowel. 4. Irregular solid pulmonary nodule of the right lower lobe measuring 5 mm. Although likely benign, if the patient is high-risk, given the morphology a non-contrast chest CT can be considered in 12 months.This recommendation follows the consensus statement: Guidelines for Management of Incidental Pulmonary Nodules Detected on CT Images: From the Fleischner Society 2017; Radiology 2017; 284:228-243. 5. Aortic Atherosclerosis (ICD10-I70.0). Electronically Signed   By: Allegra Lai M.D.   On: 10/03/2022 10:34    Procedures Procedures    Medications Ordered in ED Medications  cefTRIAXone (ROCEPHIN) 2 g in sodium chloride 0.9 % 100 mL IVPB (has no administration in time range)  metroNIDAZOLE (FLAGYL) IVPB 500 mg (has no administration in time range)  0.9 %  sodium chloride infusion (has no administration in time range)  potassium chloride SA (KLOR-CON M) CR tablet 40 mEq (has no administration in time range)  docusate sodium (COLACE) capsule 100 mg (has no administration in time range)  insulin aspart (novoLOG) injection 0-9 Units (has no administration in time range)  prochlorperazine (COMPAZINE) injection 10 mg (has no administration in time range)  amLODipine (NORVASC) tablet 10 mg (has no administration in time range)  hydrALAZINE (APRESOLINE) tablet 100 mg (has no administration in time range)  isosorbide mononitrate (IMDUR) 24 hr tablet 120 mg (has no administration in time range)  amiodarone (PACERONE) tablet 200 mg (has no administration in time range)  metoprolol tartrate (LOPRESSOR) injection 5 mg (has no administration in time range)  potassium chloride SA (KLOR-CON M) CR tablet 40 mEq (40 mEq Oral Given 10/03/22 0932)  potassium  chloride 10 mEq in 100 mL IVPB (10 mEq Intravenous New Bag/Given 10/03/22 1044)    ED Course/ Medical Decision Making/ A&P Clinical Course as of 10/03/22 1214  Fri Oct 03, 2022  0915 Labs notable for hypokalemia, creatinine increased.  Will order potassium replacement.  EKG ordered [JK]  0916 CBC(!) normal [JK]  0953 Patient does have guaiac positive stools and leukocytosis.  Will CT to evaluate for possible diverticulitis. [JK]  1043 Patient states he is feeling somewhat better after enema treatment [JK]  1044 CT scan shows mild rectal wall thickening concerning for  proctitis.  Patient also has noted abdominal wall hernia..  Incidental pulmonary nodule noted. [JK]  1056 Discussed with Dr. Ronaldo Miyamoto regarding admission [JK]  1214 D/w Dr Levora Angel [JK]    Clinical Course User Index [JK] Linwood Dibbles, MD                             Medical Decision Making Patient is on anticoagulation.  Concern for possible GI bleeding such as colitis diverticulitis, AVM.  However patient also has signs of fecal impaction based on his symptoms and exam.  Will try an enema and proceed with laboratory testing.  Problems Addressed: Fecal impaction [facility]): acute illness or injury that poses a threat to life or bodily functions Hypokalemia: acute illness or injury that poses a threat to life or bodily functions Proctitis: acute illness or injury Pulmonary nodule: undiagnosed new problem with uncertain prognosis Rectal bleeding: acute illness or injury  Amount and/or Complexity of Data Reviewed Labs: ordered. Decision-making details documented in ED Course. Radiology: ordered and independent interpretation performed.  Risk Prescription drug management. Decision regarding hospitalization.   Patient presents ED with concerns of GI bleeding.  Patient was also complaining of rectal discomfort and there did seem to be a component of fecal impaction on my initial exam.  Patient was treated with an enema in the ED  and has noted some symptomatic improvement of his discomfort.  With his complaints of pain in his rectal bleeding was concerned about the possibility of diverticulitis or colitis.  CT scan was performed.  It does show some findings of mild proctitis but otherwise no diverticulitis or other emergent etiologies.  Patient's hemoglobin is stable however he does have guaiac positive stools.  Patient is also notably hypokalemic with a potassium of 2.4.  He is at risk for severe GI bleeding with his anticoagulation.  It is possible that the mild proctitis could have been related to his fecal impaction.  Will plan on consulting with the medical service for admission to continue to treat his hyperkalemia and monitor his hemoglobin to make sure he does not have any recurrent bleeding.        Final Clinical Impression(s) / ED Diagnoses Final diagnoses:  Rectal bleeding  Hypokalemia  Pulmonary nodule  Proctitis  Fecal impaction ([name])  AKI (acute kidney injury) [facility])    Rx / DC Orders ED Discharge Orders     None         Linwood Dibbles, MD 10/03/22 1214

## 2022-10-03 NOTE — Telephone Encounter (Signed)
Ms. Albert Powell called me this morning @ 7:45 to let me know that her dad Mr. Valone was transported to the hospital this morning by EMS with bleeding from rectum.      [name], [place] 10/03/2022

## 2022-10-03 NOTE — ED Notes (Signed)
ED Provider at bedside. 

## 2022-10-03 NOTE — ED Triage Notes (Addendum)
Patient BIB [facility] from home with diarrhea, bloody stools and nausea that began last night. Patient took 8mg  zofran before arrival.

## 2022-10-03 NOTE — H&P (Signed)
History and Physical    Patient: Albert Powell YSH:[id number] DOB: 09/08/1941 DOA: 10/03/2022 DOS: the patient was seen and examined on 10/03/2022 PCP: Patient, No Pcp Per  Patient coming from: Home  Chief Complaint:  Chief Complaint  Patient presents with   Rectal Bleeding   HPI: Albert Powell is a 82 y.o. male with medical history significant of HTN, HLD, PAF on eliquis, HFpEF, CAD, DM2, CKD3b, hypothyroidism. Presenting with rectal bleeding. He reports that he was in his normal state of health until yesterday. He notes that he had a bowel movement and saw frank blood in the toilet. He had rectal pain but no abdominal pain at the time. He has nausea but no vomiting. He tried some zofran to help. He did not have any diarrhea or fever. He did not have any sick contacts.  When his symptoms did not improve this morning, he became concerned and came to the ED for help. HE denies any other aggravating or alleviating factors.     Review of Systems: As mentioned in the history of present illness. All other systems reviewed and are negative. Past Medical History:  Diagnosis Date   Atrial fibrillation ([place]) 03/30/2020   Bradycardia, sinus 02/18/2013   CAD (coronary artery disease)    stent to RCA 2003 also had 40% lesion at that time; NUCLEAR STRESS TEST, 01/16/2010 - normal  now with cath 80-90% stenosis in LAD, will try medical therapy if no improvement PCI   Carotid artery disease ([name])    Cataract    CHF (congestive heart failure) ([place])    Chronic kidney disease (CKD), stage III (moderate) ([place])    Claudication ([place])    LEA DUPLEX, 07/07/2008 - Normal   COPD (chronic obstructive pulmonary disease) ([name])    DDD (degenerative disc disease) 2008   Diabetes mellitus    GERD (gastroesophageal reflux disease)    H/O hiatal hernia    History of colonoscopy 2004   finding of tics and AMV only no polpys   Hyperlipidemia 02/05/2013   Hypertension    Hypothyroidism    Obesity     Onychomycosis    PAD (peripheral artery disease) ([name])    Rosacea    Tobacco abuse 02/05/2013   Past Surgical History:  Procedure Laterality Date   ABDOMINAL AORTOGRAM W/LOWER EXTREMITY N/A 03/30/2018   Procedure: ABDOMINAL AORTOGRAM W/LOWER EXTREMITY;  Surgeon: Serafina Mitchell, MD;  Location: [place] [facility];  Service: Cardiovascular;  Laterality: N/A;   ABDOMINAL AORTOGRAM W/LOWER EXTREMITY Bilateral 08/27/2020   Procedure: ABDOMINAL AORTOGRAM W/LOWER EXTREMITY;  Surgeon: Lorretta Harp, MD;  Location: [place] [facility];  Service: Cardiovascular;  Laterality: Bilateral;   APPENDECTOMY     CARDIAC CATHETERIZATION  08/29/2002   2-vessel CAD with high-grade stenoses in RCA; stenting to RCA   CARDIAC CATHETERIZATION  2014   80-90% lesion will try medical therapy   CARDIAC SURGERY     stents  2003   CARDIOVERSION N/A 11/01/2013   Procedure: [place] COMPRESSION;  Surgeon: Lorretta Harp, MD;  Location: [facility] CATH LAB;  Service: Cardiovascular;  Laterality: N/A;   CAROTID ANGIOGRAM N/A 04/25/2013   Procedure: CAROTID ANGIOGRAM;  Surgeon: Lorretta Harp, MD;  Location: [facility] CATH LAB;  Service: Cardiovascular;  Laterality: N/A;   CAROTID ENDARTERECTOMY     COLON RESECTION  3/04, 6/04    1 bleeding diverticulitis, 2 complete colectomy   COLON SURGERY  2005   renal pouch rectal anastimosis   CORONARY ANGIOPLASTY WITH  STENT PLACEMENT  09/2002   2 stents to RCA   ENDARTERECTOMY Left 06/02/2013   Procedure: ENDARTERECTOMY CAROTID-LEFT;  Surgeon: Nada Libman, MD;  Location: [facility] OR;  Service: Vascular;  Laterality: Left;   INTRAVASCULAR PRESSURE WIRE/FFR STUDY N/A 04/02/2020   Procedure: INTRAVASCULAR PRESSURE WIRE/FFR STUDY;  Surgeon: [name], MD;  Location: [facility] INVAS[facility] LAB;  Service: Cardiovascular;  Laterality: N/A;  DFR - RCA   LEFT HEART CATH AND CORONARY ANGIOGRAPHY N/A 04/02/2020   Procedure: LEFT HEART CATH AND CORONARY ANGIOGRAPHY;  Surgeon: [name],  MD;  Location: [facility] INVAS[facility] LAB;  Service: Cardiovascular;  Laterality: N/A;   LEFT HEART CATHETERIZATION WITH CORONARY ANGIOGRAM N/A 02/08/2013   Procedure: LEFT HEART CATHETERIZATION WITH CORONARY ANGIOGRAM;  Surgeon: [name], MD;  Location: [facility] CATH LAB;  Service: Cardiovascular;  Laterality: N/A;   LOWER EXTREMITY ANGIOGRAM N/A 10/20/2013   Procedure: LOWER EXTREMITY ANGIOGRAM;  Surgeon: [name], MD;  Location: [facility] CATH LAB;  Service: Cardiovascular;  Laterality: N/A;   PATCH ANGIOPLASTY Left 06/02/2013   Procedure: PATCH ANGIOPLASTY;  Surgeon: Nada Libman, MD;  Location: [facility] OR;  Service: Vascular;  Laterality: Left;   PERIPHERAL VASCULAR INTERVENTION Bilateral 03/30/2018   Procedure: PERIPHERAL VASCULAR INTERVENTION;  Surgeon: Nada Libman, MD;  Location: [facility];  Service: Cardiovascular;  Laterality: Bilateral;  common iliacs   PRESSURE SENSOR/CARDIOMEMS N/A 07/02/2022   Procedure: PRESSURE SENSOR/CARDIOMEMS;  Surgeon: Laurey Morale, MD;  Location: [facility] INVASIVE CV LAB;  Service: Cardiovascular;  Laterality: N/A;   RIGHT HEART CATH N/A 07/02/2022   Procedure: RIGHT HEART CATH;  Surgeon: Laurey Morale, MD;  Location: [facility] INVASIVE CV LAB;  Service: Cardiovascular;  Laterality: N/A;   RIGHT/LEFT HEART CATH AND CORONARY ANGIOGRAPHY N/A 05/20/2022   Procedure: RIGHT/LEFT HEART CATH AND CORONARY ANGIOGRAPHY;  Surgeon: Orbie Pyo, MD;  Location: [facility];  Service: Cardiovascular;  Laterality: N/A;   TONSILLECTOMY     Social History:  reports that he quit smoking about 4 years ago. His smoking use included cigarettes and e-cigarettes. He has a 85.50 pack-year smoking history. He has never used smokeless tobacco. He reports current alcohol use of about 1.0 standard drink of alcohol per week. He reports that he does not use drugs.  Allergies  Allergen Reactions   Fentanyl Other (See Comments)    Behavioral changes   Gabapentin Swelling    Lisinopril Other (See Comments)    Unknown reaction   Lyrica [Pregabalin] Other (See Comments)    Unknown reaction   Metformin Diarrhea   Propofol Other (See Comments)    Heart rate dropped    Family History  Problem Relation Age of Onset   Heart disease Father        heart attack at 72   Heart attack Father    Hyperlipidemia Father    Colonic polyp Mother        that bled out   COPD Mother    Diabetes Mother    Heart disease Sister    Colonic polyp Sister    Stroke Maternal Grandfather    Heart attack Paternal Grandfather    Other Neg Hx        hypogonadism    Prior to Admission medications   Medication Sig Start Date End Date Taking? Authorizing Provider  amiodarone (PACERONE) 200 MG tablet Take 1 tablet (200 mg total) by mouth 2 (two) times daily. 08/05/22   Laurey Morale, MD  amLODipine (NORVASC) 10 MG tablet Take 1 tablet (10 mg total) by mouth daily. 08/05/22 08/05/23  Larey Dresser, MD  apixaban (ELIQUIS) 2.5 MG TABS tablet Take 1 tablet (2.5 mg total) by mouth 2 (two) times daily. 08/05/22   Larey Dresser, MD  atorvastatin (LIPITOR) 40 MG tablet Take 1 tablet (40 mg total) by mouth daily. 08/15/22   Larey Dresser, MD  clopidogrel (PLAVIX) 75 MG tablet Take 1 tablet (75 mg total) by mouth daily. 08/15/22   Larey Dresser, MD  glipiZIDE (GLUCOTROL) 5 MG tablet Take 5 mg by mouth daily before breakfast.    [provider]  hydrALAZINE (APRESOLINE) 100 MG tablet Take 1 tablet (100 mg total) by mouth 3 (three) times daily. 09/25/22 10/25/22  Regalado, Jerald Kief A, MD  isosorbide mononitrate (IMDUR) 120 MG 24 hr tablet Take 1 tablet (120 mg total) by mouth daily. 08/15/22   Larey Dresser, MD  levothyroxine (SYNTHROID) 25 MCG tablet TAKE ONE TABLET BY MOUTH DAILY BEFORE BREAKFAST Patient taking differently: Take 75 mcg by mouth daily before breakfast. 03/13/22   Lorretta Harp, MD  Multiple Vitamin (MULTIVITAMIN WITH MINERALS) TABS tablet Take 1 tablet by  mouth daily. Patient not taking: Reported on 10/01/2022 09/25/22   Regalado, Jerald Kief A, MD  nystatin (MYCOSTATIN/NYSTOP) powder Apply topically 2 (two) times daily. 09/25/22   Regalado, Belkys A, MD  ondansetron (ZOFRAN-ODT) 4 MG disintegrating tablet Take 1 tablet (4 mg total) by mouth every 8 (eight) hours as needed for up to 15 doses for nausea or vomiting. Patient not taking: Reported on 10/01/2022 08/28/22   Wyvonnia Dusky, MD  pantoprazole (PROTONIX) 40 MG tablet Take 1 tablet (40 mg total) by mouth every other day. 07/30/22   Consuelo Pandy, PA-C    Physical Exam: Vitals:   10/03/22 0734 10/03/22 0737 10/03/22 0830 10/03/22 1005  BP:  (!) 182/75 (!) 180/111 (!) 154/86  Pulse: 87  77 89  Resp: 19  18 20   Temp: 97.9 F (36.6 C)   98 F (36.7 C)  TempSrc: Oral     SpO2: 98%  100% 100%   General: 82 y.o. male resting in bed in NAD Eyes: PERRL, normal sclera ENMT: Nares patent w/o discharge, orophaynx clear, dentition normal, ears w/o discharge/lesions/ulcers Neck: Supple, trachea midline Cardiovascular: RRR, +S1, S2, no m/g/r, equal pulses throughout Respiratory: CTABL, no w/r/r, normal WOB GI: BS+, NDNT, no masses noted, no organomegaly noted MSK: No e/c/c Neuro: A&O x 3, no focal deficits Psyc: Appropriate interaction and affect, calm/cooperative  Data Reviewed:  Results for orders placed or performed during the hospital encounter of 10/03/22 (from the past 24 hour(s))  CBC     Status: Abnormal   Collection Time: 10/03/22  8:14 AM  Result Value Ref Range   WBC 11.0 (H) 4.0 - 10.5 K/uL   RBC 4.91 4.22 - 5.81 MIL/uL   Hemoglobin 14.6 13.0 - 17.0 g/dL   HCT 41.4 39.0 - 52.0 %   MCV 84.3 80.0 - 100.0 fL   MCH 29.7 26.0 - 34.0 pg   MCHC 35.3 30.0 - 36.0 g/dL   RDW 16.0 (H) 11.5 - 15.5 %   Platelets 270 150 - 400 K/uL   nRBC 0.0 0.0 - 0.2 %  Basic metabolic panel     Status: Abnormal   Collection Time: 10/03/22  8:14 AM  Result Value Ref Range   Sodium 130 (L) 135  - 145 mmol/L   Potassium 2.4 (LL) 3.5 -  5.1 mmol/L   Chloride 94 (L) 98 - 111 mmol/L   CO2 20 (L) 22 - 32 mmol/L   Glucose, Bld 105 (H) 70 - 99 mg/dL   BUN 14 8 - 23 mg/dL   Creatinine, Ser 3.71 (H) 0.61 - 1.24 mg/dL   Calcium 9.0 8.9 - 06.2 mg/dL   GFR, Estimated 41 (L) >60 mL/min   Anion gap 16 (H) 5 - 15  Type and screen [facility]     Status: None   Collection Time: 10/03/22  8:14 AM  Result Value Ref Range   ABO/RH(D) O POS    Antibody Screen NEG    Sample Expiration      10/06/2022,2359 Performed at [facility], [address], [place], [place] [address]   POC occult blood, ED Provider will collect     Status: Abnormal   Collection Time: 10/03/22  8:20 AM  Result Value Ref Range   Fecal Occult Bld POSITIVE (A) NEGATIVE   CT ab/pelvis: 1. No acute findings in the abdomen or pelvis. 2. Mild rectal wall thickening, concerning for proctitis. 3. Ventral abdominal wall hernia containing a nondilated loop of small bowel. 4. Irregular solid pulmonary nodule of the right lower lobe measuring 5 mm. Although likely benign, if the patient is high-risk, given the morphology a non-contrast chest CT can be considered in 12 months.This recommendation follows the consensus statement: Guidelines for Management of Incidental Pulmonary Nodules Detected on CT Images: From the Fleischner Society 2017; Radiology 2017; 284:228-243. 5. Aortic Atherosclerosis (ICD10-I70.0).  Assessment and Plan: GIB     - place obs, tele     - trend Hgb; transfuse of Hgb < 7     - hold anticoagulation/anti-platelets     - [facility] consulted, appreciate assistance  Proctitis Constipation     - CT imaging as above     - ? Secondary to constipation?; had enema in ED     - No UC/Crohn's hx, no recent sex     - will cover w/ rocephin, flagyl for now     - pain control     - colace, PRN miralax  Hypokalemia     - replace K+; check Mg2+     - rpt renal function panel at  1800 hrs to check progress  CKD3b     - at baseline, follow  Chronic hyponatremia     - mild     - getting fluids, follow  DM2     - SSI, glucose checks     - CLD until seen by GI  Chronic HFpEF     - continue home regimen when confirmed     - daily wts, I&O  HTN     - continue home regimen when confirmed  HLD     - continue home regimen when confirmed  PAF on eliquis CAD     - hold anti-coagulation     - continue home regimen when confirmed otherwise  Hypothyroidism     - continue home regimen when confirmed  Pulmonary nodule     - as noted on CT     - outpt follow up  Advance Care Planning:   Code Status: FULL  Consults: [facility]  Family Communication: None at bedside  Severity of Illness: The appropriate patient status for this patient is OBSERVATION. Observation status is judged to be reasonable and necessary in order to provide the required intensity of service to ensure the patient's safety.  The patient's presenting symptoms, physical exam findings, and initial radiographic and laboratory data in the context of their medical condition is felt to place them at decreased risk for further clinical deterioration. Furthermore, it is anticipated that the patient will be medically stable for discharge from the hospital within 2 midnights of admission.   Author: Teddy Spike, DO 10/03/2022 10:56 AM  For on call review [email or web address].

## 2022-10-04 DIAGNOSIS — Z955 Presence of coronary angioplasty implant and graft: Secondary | ICD-10-CM | POA: Diagnosis not present

## 2022-10-04 DIAGNOSIS — E785 Hyperlipidemia, unspecified: Secondary | ICD-10-CM | POA: Diagnosis present

## 2022-10-04 DIAGNOSIS — N281 Cyst of kidney, acquired: Secondary | ICD-10-CM | POA: Diagnosis present

## 2022-10-04 DIAGNOSIS — Z884 Allergy status to anesthetic agent status: Secondary | ICD-10-CM | POA: Diagnosis not present

## 2022-10-04 DIAGNOSIS — N1832 Chronic kidney disease, stage 3b: Secondary | ICD-10-CM | POA: Diagnosis present

## 2022-10-04 DIAGNOSIS — E039 Hypothyroidism, unspecified: Secondary | ICD-10-CM | POA: Diagnosis present

## 2022-10-04 DIAGNOSIS — K625 Hemorrhage of anus and rectum: Secondary | ICD-10-CM | POA: Diagnosis present

## 2022-10-04 DIAGNOSIS — K439 Ventral hernia without obstruction or gangrene: Secondary | ICD-10-CM | POA: Diagnosis present

## 2022-10-04 DIAGNOSIS — E1151 Type 2 diabetes mellitus with diabetic peripheral angiopathy without gangrene: Secondary | ICD-10-CM | POA: Diagnosis present

## 2022-10-04 DIAGNOSIS — K219 Gastro-esophageal reflux disease without esophagitis: Secondary | ICD-10-CM | POA: Diagnosis present

## 2022-10-04 DIAGNOSIS — K922 Gastrointestinal hemorrhage, unspecified: Secondary | ICD-10-CM | POA: Diagnosis present

## 2022-10-04 DIAGNOSIS — K5289 Other specified noninfective gastroenteritis and colitis: Secondary | ICD-10-CM | POA: Diagnosis present

## 2022-10-04 DIAGNOSIS — N179 Acute kidney failure, unspecified: Secondary | ICD-10-CM | POA: Diagnosis present

## 2022-10-04 DIAGNOSIS — E1122 Type 2 diabetes mellitus with diabetic chronic kidney disease: Secondary | ICD-10-CM | POA: Diagnosis present

## 2022-10-04 DIAGNOSIS — I130 Hypertensive heart and chronic kidney disease with heart failure and stage 1 through stage 4 chronic kidney disease, or unspecified chronic kidney disease: Secondary | ICD-10-CM | POA: Diagnosis present

## 2022-10-04 DIAGNOSIS — K5641 Fecal impaction: Secondary | ICD-10-CM | POA: Diagnosis present

## 2022-10-04 DIAGNOSIS — R935 Abnormal findings on diagnostic imaging of other abdominal regions, including retroperitoneum: Secondary | ICD-10-CM | POA: Diagnosis not present

## 2022-10-04 DIAGNOSIS — I5032 Chronic diastolic (congestive) heart failure: Secondary | ICD-10-CM | POA: Diagnosis present

## 2022-10-04 DIAGNOSIS — J449 Chronic obstructive pulmonary disease, unspecified: Secondary | ICD-10-CM | POA: Diagnosis present

## 2022-10-04 DIAGNOSIS — E876 Hypokalemia: Secondary | ICD-10-CM | POA: Diagnosis present

## 2022-10-04 DIAGNOSIS — M5416 Radiculopathy, lumbar region: Secondary | ICD-10-CM | POA: Diagnosis present

## 2022-10-04 DIAGNOSIS — R911 Solitary pulmonary nodule: Secondary | ICD-10-CM | POA: Diagnosis present

## 2022-10-04 DIAGNOSIS — E871 Hypo-osmolality and hyponatremia: Secondary | ICD-10-CM | POA: Diagnosis present

## 2022-10-04 DIAGNOSIS — F0390 Unspecified dementia without behavioral disturbance: Secondary | ICD-10-CM | POA: Diagnosis present

## 2022-10-04 DIAGNOSIS — I4821 Permanent atrial fibrillation: Secondary | ICD-10-CM | POA: Diagnosis present

## 2022-10-04 DIAGNOSIS — I2510 Atherosclerotic heart disease of native coronary artery without angina pectoris: Secondary | ICD-10-CM | POA: Diagnosis present

## 2022-10-04 LAB — CBC
HCT: 36 % — ABNORMAL LOW (ref 39.0–52.0)
Hemoglobin: 12.5 g/dL — ABNORMAL LOW (ref 13.0–17.0)
MCH: 30.3 pg (ref 26.0–34.0)
MCHC: 34.7 g/dL (ref 30.0–36.0)
MCV: 87.2 fL (ref 80.0–100.0)
Platelets: 236 10*3/uL (ref 150–400)
RBC: 4.13 MIL/uL — ABNORMAL LOW (ref 4.22–5.81)
RDW: 16.4 % — ABNORMAL HIGH (ref 11.5–15.5)
WBC: 8.4 10*3/uL (ref 4.0–10.5)
nRBC: 0 % (ref 0.0–0.2)

## 2022-10-04 LAB — COMPREHENSIVE METABOLIC PANEL
ALT: 22 U/L (ref 0–44)
AST: 35 U/L (ref 15–41)
Albumin: 2.8 g/dL — ABNORMAL LOW (ref 3.5–5.0)
Alkaline Phosphatase: 69 U/L (ref 38–126)
Anion gap: 9 (ref 5–15)
BUN: 10 mg/dL (ref 8–23)
CO2: 21 mmol/L — ABNORMAL LOW (ref 22–32)
Calcium: 7.8 mg/dL — ABNORMAL LOW (ref 8.9–10.3)
Chloride: 99 mmol/L (ref 98–111)
Creatinine, Ser: 1.4 mg/dL — ABNORMAL HIGH (ref 0.61–1.24)
GFR, Estimated: 51 mL/min — ABNORMAL LOW (ref >60)
Glucose, Bld: 205 mg/dL — ABNORMAL HIGH (ref 70–99)
Potassium: 3.4 mmol/L — ABNORMAL LOW (ref 3.5–5.1)
Sodium: 129 mmol/L — ABNORMAL LOW (ref 135–145)
Total Bilirubin: 0.9 mg/dL (ref 0.3–1.2)
Total Protein: 5.5 g/dL — ABNORMAL LOW (ref 6.5–8.1)

## 2022-10-04 LAB — GLUCOSE, CAPILLARY
Glucose-Capillary: 140 mg/dL — ABNORMAL HIGH (ref 70–99)
Glucose-Capillary: 131 mg/dL — ABNORMAL HIGH (ref 70–99)
Glucose-Capillary: 157 mg/dL — ABNORMAL HIGH (ref 70–99)
Glucose-Capillary: 159 mg/dL — ABNORMAL HIGH (ref 70–99)

## 2022-10-04 LAB — HEMOGLOBIN AND HEMATOCRIT, BLOOD
HCT: 37.6 % — ABNORMAL LOW (ref 39.0–52.0)
HCT: 36.5 % — ABNORMAL LOW (ref 39.0–52.0)
Hemoglobin: 12.9 g/dL — ABNORMAL LOW (ref 13.0–17.0)
Hemoglobin: 12.5 g/dL — ABNORMAL LOW (ref 13.0–17.0)

## 2022-10-04 NOTE — Progress Notes (Signed)
[facility] Gastroenterology Progress Note  Albert Powell 82 y.o. 1941-03-11  CC: Rectal bleeding, fecal impaction, possible stercoral proctitis   Subjective: Patient seen and examined at bedside.   feeling much better after getting enemas and fecal disimpaction.  Denies any further bleeding.  ROS : afebrile, negative for nausea and vomiting   Objective: Vital signs in last 24 hours: Vitals:   10/04/22 0442 10/04/22 0836  BP: (!) 168/62 (!) 152/62  Pulse: 63 65  Resp: 18   Temp: 97.6 F (36.4 C)   SpO2: 96%     Physical Exam:  General, resting comfortably, not in acute distress. Abdomen is soft, minimally distended, bowel sounds present, no peritoneal signs. Alert and oriented x 3 Mood and affect normal Lab Results: Recent Labs    10/03/22 1135 10/03/22 1758 10/04/22 0059  NA  --  132* 129*  K  --  2.5* 3.4*  CL  --  98 99  CO2  --  23 21*  GLUCOSE  --  93 205*  BUN  --  12 10  CREATININE  --  1.40* 1.40*  CALCIUM  --  8.4* 7.8*  MG 2.0  --   --   PHOS  --  2.7  --    Recent Labs    10/03/22 1758 10/04/22 0059  AST  --  35  ALT  --  22  ALKPHOS  --  69  BILITOT  --  0.9  PROT  --  5.5*  ALBUMIN 3.5 2.8*   Recent Labs    10/03/22 0814 10/03/22 1650 10/04/22 0542 10/04/22 0543  WBC 11.0*  --  8.4  --   HGB 14.6   < > 12.5* 12.5*  HCT 41.4   < > 36.0* 36.5*  MCV 84.3  --  87.2  --   PLT 270  --  236  --    < > = values in this interval not displayed.   No results for input(s): "LABPROT", "INR" in the last 72 hours.    Assessment/Plan: -Rectal bleeding in a patient in setting of use of Eliquis and Plavix.  CT scan showed evidence of prior colectomy and ileal rectal anastomosis with rectal wall thickening.  Rectal exam by ED physician showed large stool ball concerning for fecal impaction. -History of atrial fibrillation.  Was on Eliquis. -Coronary artery disease.  Was on Plavix. -Dyspnea on exertion    Recommendations -------------------------- -No further bleeding.  Hemoglobin stable.  Recent CT scan showed rectal wall thickening but to previous CT scan did not showed any rectal wall thickening which argues against any chronic process. - offered flex sig but patient declined at this time because he is feeling better. -Okay to DC antibiotics from GI standpoint. -No further inpatient GI workup planned.  Okay to discharge from GI standpoint.   -Okay to resume anticoagulation and antiplatelet after 48 hours. GI will sign off.  Discussed with hospitalist.  Call us back if needed.  Otis Brace MD, [place] 10/04/2022, 10:42 AM  Contact #  [phone]

## 2022-10-04 NOTE — Progress Notes (Addendum)
PROGRESS NOTE   Albert Powell  HQP:[id number] DOB: 04/17/1941 DOA: 10/03/2022 PCP: Patient, No Pcp Per  Brief Narrative:  82 year old white male known HTN HLD HF R EF EF 25 to 30% CardioMEMS implanted 06/2022 -CAD status post PCI 2003 Carotid disease status post LICA endarterectomy "14  permanent A-fib CHADVASC >3 on Eliquis CKD 3B Prior colectomy with ileorectal anastomosis Dementia  Has been admitted several times in December and most recently 1/3 through 09/25/2022 vomiting syncopal episode and possible gastroenteritis creatinine was 3.1 he was hydrated torsemide was resumed in the outpatient setting Presented to emergency room [place] [facility] with Diarrhea bloody stools X1 and nausea and took Zofran-no hematemesis ED felt that there was some component of rectal impaction patient was given an enema and had improvement CT scan did show proctitis but no diverticulitis he was also found to be hypokalemic with hemoglobin of 2.4  GI consulted recommending conservative management kept on liquid diet  Hospital-Problem based course  Fecal impaction?  Stercoral colitis -Patient disimpacted with good result in the emergency room - Eliquis on hold Plavix on hold - Defer to GI for further plans-would hold on any scope at this time for now -Given lack of constitutional symptoms I think we can probably discontinue antibiotics after 1 to 2 days as I do not think this is infectious  ?  Acute GI bleed - Hemoglobin is dropped 2 points but this may be dilutional as he received a liter of fluid in the ED -Continue to trend -Transfusion threshold not met  CKD 3 B -Baseline creatinine variable-monitor trends  Severe hypokalemia hyponatremia -Potassium was 2.5 sodium has dropped a little bit - Has been replaced we will hold replacement and monitor trends in the morning - He has not been resumed on diuretics  CAD with PCI in 2003 HFrEF - Cannot use Plavix at this time, continue Imdur 120  hydralazine 100 3 times daily atorvastatin -Last admission torsemide was held because of AKI - Monitor trends  Permanent A-fib CHADVASC >4 previously on Eliquis 2.5 twice daily - Holding Eliquis given unsurety of source of bleeding - Monitor trends, continue amiodarone 200 twice daily for now  DM TY 2 last A1c 7.2 - Glipizide held on admission-continue sliding scale  Multiple renal cysts - Outpatient follow-up   DVT prophylaxis: SCD Code Status: Full Family Communication: spoke with wife [phone] and updated Disposition:  Status is: Observation The patient will require care spanning > 2 midnights and should be moved to inpatient because: Needs monitoring     Subjective: Coherent awake alert but does not recall details of last hospital stay Has not had a dark stool since admission No cough no cold no fever no chills  Objective: Vitals:   10/03/22 2123 10/04/22 0009 10/04/22 0442 10/04/22 0836  BP: (!) 171/71 (!) 166/65 (!) 168/62 (!) 152/62  Pulse: 65 61 63 65  Resp: 20 16 18    Temp: 97.7 F (36.5 C) 97.9 F (36.6 C) 97.6 F (36.4 C)   TempSrc: Oral Oral Oral   SpO2: 100% 99% 96%   Weight:   89.6 kg   Height:        Intake/Output Summary (Last 24 hours) at 10/04/2022 0924 Last data filed at 10/04/2022 0600 Gross per 24 hour  Intake 1458.29 ml  Output 400 ml  Net 1058.29 ml   Filed Weights   10/03/22 1723 10/04/22 0442  Weight: 88 kg 89.6 kg    Examination:  EOMI NCAT no focal deficit no icterus  no pallor no rales no rhonchi Abdomen is soft no rebound no guarding S1-S2 no murmur seems to be in sinus, sinus bradycardia Neurologically intact moving 4 limbs equally without deficit Power is 5/5  Data Reviewed: personally reviewed   CBC    Component Value Date/Time   WBC 8.4 10/04/2022 0542   RBC 4.13 (L) 10/04/2022 0542   HGB 12.5 (L) 10/04/2022 0543   HGB 14.5 08/17/2020 1243   HCT 36.5 (L) 10/04/2022 0543   HCT 43.5 08/17/2020 1243   PLT 236  10/04/2022 0542   PLT 283 08/17/2020 1243   MCV 87.2 10/04/2022 0542   MCV 88 08/17/2020 1243   MCH 30.3 10/04/2022 0542   MCHC 34.7 10/04/2022 0542   RDW 16.4 (H) 10/04/2022 0542   RDW 13.2 08/17/2020 1243   LYMPHSABS 0.9 08/28/2022 1915   MONOABS 0.6 08/28/2022 1915   EOSABS 0.0 08/28/2022 1915   BASOSABS 0.0 08/28/2022 1915      Latest Ref Rng & Units 10/04/2022   12:59 AM 10/03/2022    5:58 PM 10/03/2022    8:14 AM  CMP  Glucose 70 - 99 mg/dL 205  93  105   BUN 8 - 23 mg/dL 10  12  14    Creatinine 0.61 - 1.24 mg/dL 1.40  1.40  1.68   Sodium 135 - 145 mmol/L 129  132  130   Potassium 3.5 - 5.1 mmol/L 3.4  2.5  2.4   Chloride 98 - 111 mmol/L 99  98  94   CO2 22 - 32 mmol/L 21  23  20    Calcium 8.9 - 10.3 mg/dL 7.8  8.4  9.0   Total Protein 6.5 - 8.1 g/dL 5.5     Total Bilirubin 0.3 - 1.2 mg/dL 0.9     Alkaline Phos 38 - 126 U/L 69     AST 15 - 41 U/L 35     ALT 0 - 44 U/L 22        Radiology Studies: CT ABDOMEN PELVIS WO CONTRAST  Result Date: 10/03/2022 CLINICAL DATA:  Abdominal pain EXAM: CT ABDOMEN AND PELVIS WITHOUT CONTRAST TECHNIQUE: Multidetector CT imaging of the abdomen and pelvis was performed following the standard protocol without IV contrast. RADIATION DOSE REDUCTION: This exam was performed according to the departmental dose-optimization program which includes automated exposure control, adjustment of the mA and/or kV according to patient size and/or use of iterative reconstruction technique. COMPARISON:  CT abdomen and pelvis dated September 18, 2022 FINDINGS: Lower chest: Solid pulmonary nodule of the right lower lobe measuring 5 mm on series 4, image 20, unchanged when compared with recent prior. Small hiatal hernia. Hepatobiliary: No focal liver abnormality. Hyperdense material seen in the gallbladder, likely due to sludge. No biliary ductal dilation. Pancreas: Unremarkable. No pancreatic ductal dilatation or surrounding inflammatory changes. Spleen: Normal in size  without focal abnormality. Adrenals/Urinary Tract: Stable low-attenuation left adrenal gland nodule likely benign adenoma with no further follow-up imaging recommended. No hydronephrosis or nephrolithiasis. Atrophic left kidney. Unchanged bilateral renal cysts, anterior right renal cyst is hyperdense and likely proteinaceous, no specific follow-up imaging is recommended. Bladder is unremarkable. Stomach/Bowel: Stomach is within normal limits. Prior colectomy with ileorectal anastomosis. Mild rectal wall thickening. No evidence of obstruction. Vascular/Lymphatic: Aortic atherosclerosis. No enlarged abdominal or pelvic lymph nodes. Reproductive: Prostate is unremarkable. Other: Ventral abdominal wall hernia containing a nondilated loop of small bowel. No abdominopelvic ascites. Musculoskeletal: No acute or significant osseous findings. IMPRESSION: 1. No acute findings in  the abdomen or pelvis. 2. Mild rectal wall thickening, concerning for proctitis. 3. Ventral abdominal wall hernia containing a nondilated loop of small bowel. 4. Irregular solid pulmonary nodule of the right lower lobe measuring 5 mm. Although likely benign, if the patient is high-risk, given the morphology a non-contrast chest CT can be considered in 12 months.This recommendation follows the consensus statement: Guidelines for Management of Incidental Pulmonary Nodules Detected on CT Images: From the Fleischner Society 2017; Radiology 2017; 284:228-243. 5. Aortic Atherosclerosis (ICD10-I70.0). Electronically Signed   By: Allegra Lai M.D.   On: 10/03/2022 10:34     Scheduled Meds:  amiodarone  200 mg Oral BID   amLODipine  10 mg Oral Daily   atorvastatin  40 mg Oral Daily   docusate sodium  100 mg Oral BID   hydrALAZINE  100 mg Oral TID   insulin aspart  0-9 Units Subcutaneous TID WC   isosorbide mononitrate  120 mg Oral Daily   levothyroxine  75 mcg Oral QAC breakfast   sodium phosphate  1 enema Rectal Once   Continuous  Infusions:  cefTRIAXone (ROCEPHIN)  IV Stopped (10/03/22 1340)   metronidazole 500 mg (10/04/22 0043)     LOS: 0 days   Time spent: 46  Rhetta Mura, MD [facility] To contact the attending provider between 7A-7P or the covering provider during after hours 7P-7A, please log into the web site [email or web address] and access using universal [place] password for that web site. If you do not have the password, please call the hospital operator.  10/04/2022, 9:24 AM

## 2022-10-04 NOTE — Progress Notes (Signed)
Mobility Specialist - Progress Note   10/04/22 1409  Mobility  Activity Transferred from bed to chair  Level of Assistance Contact guard assist, steadying assist  Assistive Device Front wheel walker  Activity Response Tolerated well  Mobility Referral Yes  $Mobility charge 1 Mobility   Pt was found in bed and wanting to sit up. Agreed to transfer to chair but declined ambulating stating his "legs don't work". At EOS was left on recliner chair with necessities in reach and chair alarm on. RN notified.  Ferd Hibbs Mobility Specialist

## 2022-10-04 NOTE — Plan of Care (Signed)
  Problem: Education: Goal: Knowledge of condition and prescribed therapy will improve Outcome: Progressing   Problem: Coping: Goal: Ability to adjust to condition or change in health will improve Outcome: Progressing

## 2022-10-04 NOTE — TOC CM/SW Note (Deleted)
Transition of Care [facility]) Screening Note  Patient Details  Name: FARUQ ROSENBERGER Date of Birth: 1941/01/14  Transition of Care [facility]) CM/SW Contact:    Sherie Don, LCSW Phone Number: 10/04/2022, 9:58 AM  Transition of Care Department [facility]) has reviewed patient and no TOC needs have been identified at this time. We will continue to monitor patient advancement through interdisciplinary progression rounds. If new patient transition needs arise, please place a TOC consult.

## 2022-10-05 DIAGNOSIS — K922 Gastrointestinal hemorrhage, unspecified: Secondary | ICD-10-CM | POA: Diagnosis not present

## 2022-10-05 LAB — GLUCOSE, CAPILLARY
Glucose-Capillary: 123 mg/dL — ABNORMAL HIGH (ref 70–99)
Glucose-Capillary: 170 mg/dL — ABNORMAL HIGH (ref 70–99)
Glucose-Capillary: 127 mg/dL — ABNORMAL HIGH (ref 70–99)
Glucose-Capillary: 158 mg/dL — ABNORMAL HIGH (ref 70–99)

## 2022-10-05 LAB — CBC
HCT: 40 % (ref 39.0–52.0)
Hemoglobin: 13.7 g/dL (ref 13.0–17.0)
MCH: 29.8 pg (ref 26.0–34.0)
MCHC: 34.3 g/dL (ref 30.0–36.0)
MCV: 87 fL (ref 80.0–100.0)
Platelets: 222 10*3/uL (ref 150–400)
RBC: 4.6 MIL/uL (ref 4.22–5.81)
RDW: 16.3 % — ABNORMAL HIGH (ref 11.5–15.5)
WBC: 7.1 10*3/uL (ref 4.0–10.5)
nRBC: 0 % (ref 0.0–0.2)

## 2022-10-05 LAB — COMPREHENSIVE METABOLIC PANEL
ALT: 23 U/L (ref 0–44)
AST: 21 U/L (ref 15–41)
Albumin: 2.8 g/dL — ABNORMAL LOW (ref 3.5–5.0)
Alkaline Phosphatase: 70 U/L (ref 38–126)
Anion gap: 11 (ref 5–15)
BUN: 7 mg/dL — ABNORMAL LOW (ref 8–23)
CO2: 19 mmol/L — ABNORMAL LOW (ref 22–32)
Calcium: 8.1 mg/dL — ABNORMAL LOW (ref 8.9–10.3)
Chloride: 103 mmol/L (ref 98–111)
Creatinine, Ser: 1.28 mg/dL — ABNORMAL HIGH (ref 0.61–1.24)
GFR, Estimated: 57 mL/min — ABNORMAL LOW (ref >60)
Glucose, Bld: 148 mg/dL — ABNORMAL HIGH (ref 70–99)
Potassium: 2.7 mmol/L — CL (ref 3.5–5.1)
Sodium: 133 mmol/L — ABNORMAL LOW (ref 135–145)
Total Bilirubin: 0.7 mg/dL (ref 0.3–1.2)
Total Protein: 5.5 g/dL — ABNORMAL LOW (ref 6.5–8.1)

## 2022-10-05 MED ORDER — POTASSIUM CHLORIDE CRYS ER 20 MEQ PO TBCR
40.0000 meq | EXTENDED_RELEASE_TABLET | Freq: Two times a day (BID) | ORAL | Status: DC
  Administered 2022-10-05 – 2022-10-06 (×3): 40 meq via ORAL
  Filled 2022-10-05 (×3): qty 2

## 2022-10-05 MED ORDER — ONDANSETRON 4 MG PO TBDP
4.0000 mg | ORAL_TABLET | Freq: Three times a day (TID) | ORAL | Status: DC | PRN
  Administered 2022-10-05: 4 mg via ORAL
  Filled 2022-10-05: qty 1

## 2022-10-05 MED ORDER — POTASSIUM CHLORIDE IN NACL 40-0.9 MEQ/L-% IV SOLN
INTRAVENOUS | Status: DC
  Filled 2022-10-05 (×3): qty 1000

## 2022-10-05 NOTE — Progress Notes (Signed)
PROGRESS NOTE   Albert Powell  URK:[id number] DOB: 1941-07-11 DOA: 10/03/2022 PCP: Patient, No Pcp Per  Brief Narrative:  82 year old white male known HTN HLD HF R EF EF 25 to 30% CardioMEMS implanted 06/2022 -CAD status post PCI 2003 Carotid disease status post LICA endarterectomy "14  permanent A-fib CHADVASC >3 on Eliquis CKD 3B Prior colectomy with ileorectal anastomosis Dementia  Has been admitted several times in December and most recently 1/3 through 09/25/2022 vomiting syncopal episode and possible gastroenteritis creatinine was 3.1 he was hydrated torsemide was resumed in the outpatient setting Presented to emergency room [place] [facility] with Diarrhea bloody stools X1 and nausea and took Zofran-no hematemesis ED felt that there was some component of rectal impaction patient was given an enema and had improvement CT scan did show proctitis but no diverticulitis he was also found to be hypokalemic with hemoglobin of 2.4  GI consulted recommending conservative management kept on liquid diet-he was graduated on diet He is severely hypokalemic and needs to have this replaced prior to discharge  Hospital-Problem based course  Fecal impaction?  Stercoral colitis - Patient disimpacted with good result in the emergency room - Anticoagulation antiplatelet on hold - GI signed off and would hold on any scope at this time for now as no further source of bleeding and hemoglobin remained stable - antibiotics transiently started but discontinued subsequent to evaluation   ?  Acute GI bleed - Hemoglobin is dropped 2 points but this may be dilutional as he received a liter of fluid in the ED - Not having any dark or tarry stool - Not having any constitutional symptoms and no further workup  CKD 3 B -Baseline creatinine variable-monitor trends  Severe hypokalemia hyponatremia -Potassium was 2.7 sodium has dropped a little bit - Give IV fluid with 40 of K and K. Dur 40 twice daily - Add on  magnesium and replace if needed - He has not been resumed on diuretics  CAD with PCI in 2003 HFrEF -  continue Imdur 120 hydralazine 100 3 times daily atorvastatin -Last admission torsemide was held because of AKI - Plavix to resume in 48 hours  Permanent A-fib CHADVASC >4 previously on Eliquis 2.5 twice daily - Can resume anticoagulation in 48 hours after discharge resume Plavix 48 hours after discharge - Monitor trends, continue amiodarone 200 twice daily for now  DM TY 2 last A1c 7.2 - Glipizide held on admission-continue sliding scale  Multiple renal cysts - Outpatient follow-up   DVT prophylaxis: SCD Code Status: Full Family Communication: spoke with wife [phone] and updated on 1/20 Disposition:  Status is: Observation The patient will require care spanning > 2 midnights and should be moved to inpatient because: Needs monitoring     Subjective:  Doing fair coherent no distress no pain no fever Tells me he "cannot walk" and does not typically mobilize   Objective: Vitals:   10/05/22 0908 10/05/22 0911 10/05/22 0956 10/05/22 1040  BP: (!) 185/82 (!) 185/82 (!) 172/79 (!) 169/71  Pulse:  78 76 64  Resp:  18 15   Temp:      TempSrc:      SpO2:  98%    Weight:      Height:        Intake/Output Summary (Last 24 hours) at 10/05/2022 1059 Last data filed at 10/05/2022 1009 Gross per 24 hour  Intake 480 ml  Output 3800 ml  Net -3320 ml    Filed Weights   10/03/22 1723  10/04/22 0442 10/05/22 0442  Weight: 88 kg 89.6 kg 88.6 kg    Examination:  No icterus no pallor Right arm and left leg are swollen Chest is clear no added sound Abdomen soft no rebound No nausea neuro is intact he is coherent pleasant  Data Reviewed: personally reviewed   CBC    Component Value Date/Time   WBC 7.1 10/05/2022 0934   RBC 4.60 10/05/2022 0934   HGB 13.7 10/05/2022 0934   HGB 14.5 08/17/2020 1243   HCT 40.0 10/05/2022 0934   HCT 43.5 08/17/2020 1243   PLT 222  10/05/2022 0934   PLT 283 08/17/2020 1243   MCV 87.0 10/05/2022 0934   MCV 88 08/17/2020 1243   MCH 29.8 10/05/2022 0934   MCHC 34.3 10/05/2022 0934   RDW 16.3 (H) 10/05/2022 0934   RDW 13.2 08/17/2020 1243   LYMPHSABS 0.9 08/28/2022 1915   MONOABS 0.6 08/28/2022 1915   EOSABS 0.0 08/28/2022 1915   BASOSABS 0.0 08/28/2022 1915      Latest Ref Rng & Units 10/05/2022    9:34 AM 10/04/2022   12:59 AM 10/03/2022    5:58 PM  CMP  Glucose 70 - 99 mg/dL 148  205  93   BUN 8 - 23 mg/dL 7  10  12    Creatinine 0.61 - 1.24 mg/dL 1.28  1.40  1.40   Sodium 135 - 145 mmol/L 133  129  132   Potassium 3.5 - 5.1 mmol/L 2.7  3.4  2.5   Chloride 98 - 111 mmol/L 103  99  98   CO2 22 - 32 mmol/L 19  21  23    Calcium 8.9 - 10.3 mg/dL 8.1  7.8  8.4   Total Protein 6.5 - 8.1 g/dL 5.5  5.5    Total Bilirubin 0.3 - 1.2 mg/dL 0.7  0.9    Alkaline Phos 38 - 126 U/L 70  69    AST 15 - 41 U/L 21  35    ALT 0 - 44 U/L 23  22       Radiology Studies: No results found.   Scheduled Meds:  amiodarone  200 mg Oral BID   amLODipine  10 mg Oral Daily   atorvastatin  40 mg Oral Daily   docusate sodium  100 mg Oral BID   hydrALAZINE  100 mg Oral TID   insulin aspart  0-9 Units Subcutaneous TID WC   isosorbide mononitrate  120 mg Oral Daily   levothyroxine  75 mcg Oral QAC breakfast   potassium chloride  40 mEq Oral BID   sodium phosphate  1 enema Rectal Once   Continuous Infusions:  0.9 % NaCl with KCl 40 mEq / L       LOS: 1 day   Time spent: [place], MD [facility] To contact the attending provider between 7A-7P or the covering provider during after hours 7P-7A, please log into the web site [email or web address] and access using universal [place] password for that web site. If you do not have the password, please call the hospital operator.  10/05/2022, 10:59 AM

## 2022-10-06 DIAGNOSIS — K922 Gastrointestinal hemorrhage, unspecified: Secondary | ICD-10-CM | POA: Diagnosis not present

## 2022-10-06 LAB — COMPREHENSIVE METABOLIC PANEL
ALT: 20 U/L (ref 0–44)
AST: 18 U/L (ref 15–41)
Albumin: 2.6 g/dL — ABNORMAL LOW (ref 3.5–5.0)
Alkaline Phosphatase: 64 U/L (ref 38–126)
Anion gap: 5 (ref 5–15)
BUN: 7 mg/dL — ABNORMAL LOW (ref 8–23)
CO2: 22 mmol/L (ref 22–32)
Calcium: 7.9 mg/dL — ABNORMAL LOW (ref 8.9–10.3)
Chloride: 106 mmol/L (ref 98–111)
Creatinine, Ser: 1.24 mg/dL (ref 0.61–1.24)
GFR, Estimated: 59 mL/min — ABNORMAL LOW (ref >60)
Glucose, Bld: 117 mg/dL — ABNORMAL HIGH (ref 70–99)
Potassium: 3.9 mmol/L (ref 3.5–5.1)
Sodium: 133 mmol/L — ABNORMAL LOW (ref 135–145)
Total Bilirubin: 0.6 mg/dL (ref 0.3–1.2)
Total Protein: 5.1 g/dL — ABNORMAL LOW (ref 6.5–8.1)

## 2022-10-06 LAB — GLUCOSE, CAPILLARY
Glucose-Capillary: 150 mg/dL — ABNORMAL HIGH (ref 70–99)
Glucose-Capillary: 184 mg/dL — ABNORMAL HIGH (ref 70–99)

## 2022-10-06 LAB — CBC
HCT: 36.6 % — ABNORMAL LOW (ref 39.0–52.0)
Hemoglobin: 12.5 g/dL — ABNORMAL LOW (ref 13.0–17.0)
MCH: 30 pg (ref 26.0–34.0)
MCHC: 34.2 g/dL (ref 30.0–36.0)
MCV: 88 fL (ref 80.0–100.0)
Platelets: 216 10*3/uL (ref 150–400)
RBC: 4.16 MIL/uL — ABNORMAL LOW (ref 4.22–5.81)
RDW: 16.8 % — ABNORMAL HIGH (ref 11.5–15.5)
WBC: 8 10*3/uL (ref 4.0–10.5)
nRBC: 0 % (ref 0.0–0.2)

## 2022-10-06 LAB — MAGNESIUM: Magnesium: 1.9 mg/dL (ref 1.7–2.4)

## 2022-10-06 MED ORDER — POTASSIUM CHLORIDE CRYS ER 20 MEQ PO TBCR: 40.0000 meq | EXTENDED_RELEASE_TABLET | Freq: Two times a day (BID) | ORAL | 0 refills | Status: DC

## 2022-10-06 NOTE — TOC Progression Note (Signed)
Transition of Care [facility]) - Progression Note    Patient Details  Name: Albert Powell MRN: [id number] Date of Birth: 20-Apr-1941  Transition of Care [facility]) CM/SW Contact  Purcell Mouton, RN Phone Number: 10/06/2022, 2:36 PM  Clinical Narrative:    Pt discharging home with [facility] Referral given to in house rep.      Barriers to Discharge: Continued Medical Work up  Expected Discharge Plan and Services         Expected Discharge Date: 10/06/22                                     Social Determinants of Health (SDOH) Interventions SDOH Screenings   Food Insecurity: No Food Insecurity (10/03/2022)  Housing: Low Risk  (10/03/2022)  Transportation Needs: No Transportation Needs (10/03/2022)  Recent Concern: Transportation Needs - Unmet Transportation Needs (08/21/2022)  Utilities: Not At Risk (10/03/2022)  Alcohol Screen: Low Risk  (06/03/2022)  Depression (PHQ2-9): High Risk (08/14/2022)  Financial Resource Strain: Low Risk  (06/03/2022)  Tobacco Use: Medium Risk (10/03/2022)    Readmission Risk Interventions     No data to display

## 2022-10-06 NOTE — Discharge Summary (Signed)
Physician Discharge Summary  Albert Powell GQQ:[id number] DOB: [birth year] DOA: 10/03/2022  PCP: Patient, No Pcp Per  Admit date: 10/03/2022 Discharge date: 10/06/2022  Time spent: 36 minutes  Recommendations for Outpatient Follow-up:  Resume Plavix and Xarelto but ensure that taking Protonix as well Get labs Chem-7, CBC in about 1 week as well as hypokalemic Recommend outpatient follow-up with primary physician Note that we discontinued torsemide this admission very 7 meds look at [facility] please Consider outpatient evaluation of multiple renal cysts  Discharge Diagnoses:  MAIN problem for hospitalization   Probable stercoral colitis with fecal impaction Undifferentiated bleeding source other than this Hypokalemia on admission  Please see below for itemized issues addressed in HOpsital- refer to other progress notes for clarity if needed  Discharge Condition: Improved  Diet recommendation: Heart healthy  Filed Weights   10/04/22 0442 10/05/22 0442 10/06/22 0500  Weight: 89.6 kg 88.6 kg 93.4 kg    History of present illness:  82 year old white male known HTN HLD HF R EF EF 25 to 30% CardioMEMS implanted 06/2022 -CAD status post PCI 2003 Carotid disease status post LICA endarterectomy "14  permanent A-fib CHADVASC >3 on Eliquis CKD 3B Prior colectomy with ileorectal anastomosis Dementia   Has been admitted several times in December and most recently 1/3 through 09/25/2022 vomiting syncopal episode and possible gastroenteritis creatinine was 3.1 he was hydrated torsemide was resumed in the outpatient setting Presented to emergency room [place] [facility] with Diarrhea bloody stools X1 and nausea and took Zofran-no hematemesis ED felt that there was some component of rectal impaction patient was given an enema and had improvement CT scan did show proctitis but no diverticulitis he was also found to be hypokalemic with hemoglobin of 2.4   GI consulted recommending conservative  management kept on liquid diet-he was graduated on diet He is severely hypokalemic and needs to have this replaced prior to discharge  Hospital Course:  Fecal impaction?  Stercoral colitis - Patient disimpacted with good result in the emergency room - Anticoagulation antiplatelet  held for several days during hospital stay but resumed - GI signed off and would hold on any scope at this time for now as no further source of bleeding and hemoglobin remained stable - antibiotics transiently started but discontinued subsequent to evaluation    ?  Acute GI bleed - Hemoglobin is dropped 2 points but this may be dilutional as he received a liter of fluid in the ED - Not having any dark or tarry stool - Not having any constitutional symptoms and no further workup   CKD 3 B -Baseline creatinine variable-monitor trends   Severe hypokalemia hyponatremia -Potassium was low in the 2.7 range and replaced with both IV and oral - Placing on K. Dur on discharge and will need to continue this and get labs in a week  CAD with PCI in 2003 HFrEF -  continue Imdur 120 hydralazine 100 3 times daily atorvastatin -Last admission torsemide was held because of AKI and will continue to be held - Plavix t resumed at discharge   Permanent A-fib CHADVASC >4 previously on Eliquis 2.5 twice daily - Eliquis resumed at discharge - Monitor trends, continue amiodarone 200 twice daily for now   DM TY 2 last A1c 7.2 - Glipizide held on admission-but resumed at discharge needs outpatient follow-up   Multiple renal cysts - Outpatient follow-up   Discharge Exam: Vitals:   10/06/22 0522 10/06/22 0845  BP: (!) 174/65 (!) 174/65  Pulse: 65 (!) 59  Resp: 20   Temp: 98.4 F (36.9 C)   SpO2: 95% 100%    Subj on day of d/c   Awake coherent no distress eating drinking Ambulated with therapy with walker seems relatively stable not needing home health  General Exam on discharge  EOMI NCAT no focal deficit no  icterus no pallor no rales Chest is clear Abdomen is soft No lower extremity edema  Discharge Instructions   Discharge Instructions     Diet - low sodium heart healthy   Complete by: As directed    Discharge instructions   Complete by: As directed    Please take your potassium as indicated and you will need labs to check it to see if that is improved versus not-if you have any bleeding then you need to report this to your regular physician or come straight back to the emergency room I would recommend that you follow-up with home health recommendation  you can resume both your Plavix as well as Xarelto but we would recommend that you take your Protonix daily as this will protect your stomach in case there is any ulcers or any other issue   Increase activity slowly   Complete by: As directed       Allergies as of 10/06/2022       Reactions   Fentanyl Other (See Comments)   Behavioral changes   Gabapentin Swelling   Lisinopril Other (See Comments)   Unknown reaction   Lyrica [pregabalin] Other (See Comments)   Unknown reaction   Metformin Diarrhea   Propofol Other (See Comments)   Heart rate dropped        Medication List     TAKE these medications    amiodarone 200 MG tablet Commonly known as: PACERONE Take 1 tablet (200 mg total) by mouth 2 (two) times daily.   amLODipine 10 MG tablet Commonly known as: NORVASC Take 1 tablet (10 mg total) by mouth daily.   apixaban 2.5 MG Tabs tablet Commonly known as: ELIQUIS Take 1 tablet (2.5 mg total) by mouth 2 (two) times daily.   atorvastatin 40 MG tablet Commonly known as: LIPITOR Take 1 tablet (40 mg total) by mouth daily.   clopidogrel 75 MG tablet Commonly known as: PLAVIX Take 1 tablet (75 mg total) by mouth daily.   glipiZIDE 5 MG tablet Commonly known as: GLUCOTROL Take 5 mg by mouth daily before breakfast.   hydrALAZINE 100 MG tablet Commonly known as: APRESOLINE Take 1 tablet (100 mg total) by mouth  3 (three) times daily.   isosorbide mononitrate 120 MG 24 hr tablet Commonly known as: IMDUR Take 1 tablet (120 mg total) by mouth daily.   levothyroxine 25 MCG tablet Commonly known as: SYNTHROID TAKE ONE TABLET BY MOUTH DAILY BEFORE BREAKFAST What changed: See the new instructions.   multivitamin with minerals Tabs tablet Take 1 tablet by mouth daily.   nystatin powder Commonly known as: MYCOSTATIN/NYSTOP Apply topically 2 (two) times daily.   ondansetron 4 MG disintegrating tablet Commonly known as: ZOFRAN-ODT Take 1 tablet (4 mg total) by mouth every 8 (eight) hours as needed for up to 15 doses for nausea or vomiting.   pantoprazole 40 MG tablet Commonly known as: PROTONIX Take 1 tablet (40 mg total) by mouth every other day.   potassium chloride SA 20 MEQ tablet Commonly known as: KLOR-CON M Take 2 tablets (40 mEq total) by mouth 2 (two) times daily.       Allergies  Allergen Reactions  Fentanyl Other (See Comments)    Behavioral changes   Gabapentin Swelling   Lisinopril Other (See Comments)    Unknown reaction   Lyrica [Pregabalin] Other (See Comments)    Unknown reaction   Metformin Diarrhea   Propofol Other (See Comments)    Heart rate dropped      The results of significant diagnostics from this hospitalization (including imaging, microbiology, ancillary and laboratory) are listed below for reference.    Significant Diagnostic Studies: CT ABDOMEN PELVIS WO CONTRAST  Result Date: 10/03/2022 CLINICAL DATA:  Abdominal pain EXAM: CT ABDOMEN AND PELVIS WITHOUT CONTRAST TECHNIQUE: Multidetector CT imaging of the abdomen and pelvis was performed following the standard protocol without IV contrast. RADIATION DOSE REDUCTION: This exam was performed according to the departmental dose-optimization program which includes automated exposure control, adjustment of the mA and/or kV according to patient size and/or use of iterative reconstruction technique.  COMPARISON:  CT abdomen and pelvis dated September 18, 2022 FINDINGS: Lower chest: Solid pulmonary nodule of the right lower lobe measuring 5 mm on series 4, image 20, unchanged when compared with recent prior. Small hiatal hernia. Hepatobiliary: No focal liver abnormality. Hyperdense material seen in the gallbladder, likely due to sludge. No biliary ductal dilation. Pancreas: Unremarkable. No pancreatic ductal dilatation or surrounding inflammatory changes. Spleen: Normal in size without focal abnormality. Adrenals/Urinary Tract: Stable low-attenuation left adrenal gland nodule likely benign adenoma with no further follow-up imaging recommended. No hydronephrosis or nephrolithiasis. Atrophic left kidney. Unchanged bilateral renal cysts, anterior right renal cyst is hyperdense and likely proteinaceous, no specific follow-up imaging is recommended. Bladder is unremarkable. Stomach/Bowel: Stomach is within normal limits. Prior colectomy with ileorectal anastomosis. Mild rectal wall thickening. No evidence of obstruction. Vascular/Lymphatic: Aortic atherosclerosis. No enlarged abdominal or pelvic lymph nodes. Reproductive: Prostate is unremarkable. Other: Ventral abdominal wall hernia containing a nondilated loop of small bowel. No abdominopelvic ascites. Musculoskeletal: No acute or significant osseous findings. IMPRESSION: 1. No acute findings in the abdomen or pelvis. 2. Mild rectal wall thickening, concerning for proctitis. 3. Ventral abdominal wall hernia containing a nondilated loop of small bowel. 4. Irregular solid pulmonary nodule of the right lower lobe measuring 5 mm. Although likely benign, if the patient is high-risk, given the morphology a non-contrast chest CT can be considered in 12 months.This recommendation follows the consensus statement: Guidelines for Management of Incidental Pulmonary Nodules Detected on CT Images: From the Fleischner Society 2017; Radiology 2017; 284:228-243. 5. Aortic  Atherosclerosis (ICD10-I70.0). Electronically Signed   By: Yetta Glassman M.D.   On: 10/03/2022 10:34   CT ABDOMEN PELVIS WO CONTRAST  Result Date: 09/18/2022 CLINICAL DATA:  Nausea vomiting. Acute kidney injury. Bowel obstruction suspected. EXAM: CT ABDOMEN AND PELVIS WITHOUT CONTRAST TECHNIQUE: Multidetector CT imaging of the abdomen and pelvis was performed following the standard protocol without IV contrast. RADIATION DOSE REDUCTION: This exam was performed according to the departmental dose-optimization program which includes automated exposure control, adjustment of the mA and/or kV according to patient size and/or use of iterative reconstruction technique. COMPARISON:  CT renal stone protocol 09/04/2022 FINDINGS: Lower chest: 2 adjacent elongated densities in the right lower lobe on image 9/5 that have minimally changed since the recent comparison examination. Largest density measures up to 4 mm and likely an incidental finding. No pleural effusions. Hepatobiliary: Normal appearance of the liver. High-density material in the gallbladder is suggestive for sludge. No significant gallbladder distension. No evidence for gallbladder inflammatory changes. Pancreas: Unremarkable. No pancreatic ductal dilatation or surrounding inflammatory changes.  Spleen: Normal in size without focal abnormality. Adrenals/Urinary Tract: Again noted is a low-density nodule in left adrenal gland that is suggestive for an adenoma based on the Hounsfield units measuring less than 0. Normal appearance of the right adrenal gland. Left kidney is atrophic with low-density cysts. Again noted is an exophytic hyperdense structure along the anterior aspect of the right kidney. This structure measures up to 1.2 cm on image 31/3 and minimally changed since the recent comparison examination. Hounsfield units measure approximately 51. Suspect this is a hyperdense or proteinaceous cyst. Additional low-density cysts in the right kidney. Negative  for hydronephrosis. Normal appearance of the urinary bladder. No ureter stones. Calcifications involving both kidneys are most likely vascular. No definite urinary calculi. Stomach/Bowel: Small hiatal hernia. Normal appearance of the stomach. Again noted are loops of small bowel involving a right paracentral upper abdominal ventral hernia. There is no evidence for bowel dilatation or obstruction. No focal bowel inflammation. Evidence for colectomy with ileal-rectal anastomosis. Moderate amount of stool in the rectum. No acute bowel inflammation. Vascular/Lymphatic: Abdominal aorta and iliac arteries are heavily calcified. No evidence for an aortic aneurysm. Stents involving the common iliac arteries bilaterally. Significant calcifications involving the right common femoral artery. No significant lymph node enlargement in the abdomen or pelvis. Reproductive: Prostate is stable and unremarkable. Other: Negative for free fluid. Evidence for postoperative changes along the anterior abdominal wall. Upper abdominal right paracentral ventral hernia containing small bowel. There is small left paracentral ventral hernia in the upper abdomen containing fat. Laxity along the right paracentral lower abdominal wall. Negative for free air. Musculoskeletal: Spinal stimulator with the generator in the right buttock. Spinal stimulator is incompletely imaged. Disc space narrowing at L4-L5 and L5-S1. No acute bone abnormality. IMPRESSION: 1. No acute abnormality in the abdomen or pelvis. 2. Right upper abdominal ventral hernia containing small bowel but no evidence for obstruction or acute inflammation. 3. Status post colectomy. 4. Atrophic left kidney. 5. Multiple renal cysts including a probable proteinaceous or hemorrhagic right renal cyst. These are likely benign renal cysts and not require dedicated follow-up. 6.  Aortic Atherosclerosis (ICD10-I70.0). Electronically Signed   By: Markus Daft M.D.   On: 09/18/2022 09:48   DG Chest  1 View  Result Date: 09/17/2022 CLINICAL DATA:  Chest pain. EXAM: CHEST  1 VIEW COMPARISON:  09/04/2022 FINDINGS: Heart size and mediastinal contours are unremarkable. Implanted device along the left heart border is unchanged from the previous exam. Dorsal column stimulator is noted with lead terminating in the midthoracic spine. No pleural effusion or edema. No airspace opacities identified. IMPRESSION: No active disease. Electronically Signed   By: Kerby Moors M.D.   On: 09/17/2022 14:53    Microbiology: No results found for this or any previous visit (from the past 240 hour(s)).   Labs: Basic Metabolic Panel: Recent Labs  Lab 10/03/22 0814 10/03/22 1135 10/03/22 1758 10/04/22 0059 10/05/22 0934 10/06/22 0528  NA 130*  --  132* 129* 133* 133*  K 2.4*  --  2.5* 3.4* 2.7* 3.9  CL 94*  --  98 99 103 106  CO2 20*  --  23 21* 19* 22  GLUCOSE 105*  --  93 205* 148* 117*  BUN 14  --  12 10 7* 7*  CREATININE 1.68*  --  1.40* 1.40* 1.28* 1.24  CALCIUM 9.0  --  8.4* 7.8* 8.1* 7.9*  MG  --  2.0  --   --   --  1.9  PHOS  --   --  2.7  --   --   --    Liver Function Tests: Recent Labs  Lab 10/03/22 1758 10/04/22 0059 10/05/22 0934 10/06/22 0528  AST  --  35 21 18  ALT  --  22 23 20   ALKPHOS  --  69 70 64  BILITOT  --  0.9 0.7 0.6  PROT  --  5.5* 5.5* 5.1*  ALBUMIN 3.5 2.8* 2.8* 2.6*   No results for input(s): "LIPASE", "AMYLASE" in the last 168 hours. No results for input(s): "AMMONIA" in the last 168 hours. CBC: Recent Labs  Lab 10/03/22 0814 10/03/22 1650 10/04/22 0059 10/04/22 0542 10/04/22 0543 10/05/22 0934 10/06/22 0528  WBC 11.0*  --   --  8.4  --  7.1 8.0  HGB 14.6   < > 12.9* 12.5* 12.5* 13.7 12.5*  HCT 41.4   < > 37.6* 36.0* 36.5* 40.0 36.6*  MCV 84.3  --   --  87.2  --  87.0 88.0  PLT 270  --   --  236  --  222 216   < > = values in this interval not displayed.   Cardiac Enzymes: No results for input(s): "CKTOTAL", "CKMB", "CKMBINDEX", "TROPONINI" in  the last 168 hours. BNP: BNP (last 3 results) Recent Labs    06/23/22 1752 06/24/22 0358 09/04/22 1252  BNP 3,165.5* 3,698.8* 226.5*    ProBNP (last 3 results) No results for input(s): "PROBNP" in the last 8760 hours.  CBG: Recent Labs  Lab 10/05/22 0726 10/05/22 1132 10/05/22 1641 10/05/22 2053 10/06/22 0728  GLUCAP 123* 170* 127* 158* 150*       Signed:  10/08/22 MD   [facility] 10/06/2022, 10:39 AM

## 2022-10-06 NOTE — Progress Notes (Signed)
  Albert Powell,Albert Powell  Called wife who stated she would come to pick up pt. On arrival, wife [name], appeared confused to staff, & pt stated she had dementia & could not drive. Discussed with Agricultural consultant, who also spoke with Hydrologist. Pt seemed very concerned about riding home.  Called pt's daughter, Albert Powell again, & questions safety of wife driving. Stated wife does not take medications as she is supposed to, per psychiatry, & she has attempted to have her driver's licensed revoked, but no clinician has deemed her inappropriate to drive.  Provided discharge instructions. Pt verbalized understanding of information.

## 2022-10-06 NOTE — Progress Notes (Signed)
Called daughter to inform pt is discharged. No answer, left message with RN's return call.  [id number], Called & spoke with wife, who stated she would come to pick pt up for transport

## 2022-10-06 NOTE — Evaluation (Signed)
Physical Therapy Evaluation Patient Details Name: Albert Powell MRN: [id number] DOB: 07/05/1941 Today's Date: 10/06/2022  History of Present Illness  Albert Powell is a 82 y.o. male who with recent admission for AKI with possible infectious gastroenteritis.  Pt admitted 10/03/22 for Probable stercoral colitis with fecal impaction.  PMHx: hypertension, hyperlipidemia, paroxysmal atrial fibrillation, HFpER, CAD s/p PCI, diabetes mellitus type 2, CKD stage IIIb, hypothyroidism, dementia, PAD, claudication.  Clinical Impression  Patient evaluated by Physical Therapy with no further acute PT needs identified. All education has been completed and the patient has no further questions.   Pt reluctant to mobilize stating he can't walk however when questioned how he moves around at home, he stated with a walker.  Pt pending d/c today and encouraged to ambulate in preparation for return home.  Pt did require min assist upon returning to room for LOB however states his foot caught his sock.  Pt denies falls at home however previous admission notes from therapy reports previous history of falls.  Pt feels able to return home today and states he was supposed to have HHPT after his recent admission so recommend f/u with HHPT.  See below for any follow-up Physical Therapy or equipment needs. PT is signing off. Thank you for this referral.        Recommendations for follow up therapy are one component of a multi-disciplinary discharge planning process, led by the attending physician.  Recommendations may be updated based on patient status, additional functional criteria and insurance authorization.  Follow Up Recommendations Home health PT      Assistance Recommended at Discharge Intermittent Supervision/Assistance  Patient can return home with the following  Assistance with cooking/housework;Assist for transportation;Help with stairs or ramp for entrance    Equipment Recommendations None recommended by PT   Recommendations for Other Services       Functional Status Assessment Patient has had a recent decline in their functional status and demonstrates the ability to make significant improvements in function in a reasonable and predictable amount of time.     Precautions / Restrictions Precautions Precautions: Fall      Mobility  Bed Mobility Overal bed mobility: Modified Independent                  Transfers Overall transfer level: Needs assistance Equipment used: Rolling walker (2 wheels) Transfers: Sit to/from Stand Sit to Stand: Min guard           General transfer comment: min/guard for safety    Ambulation/Gait Ambulation/Gait assistance: Min guard, Min assist Gait Distance (Feet): 160 Feet Assistive device: Rolling walker (2 wheels) Gait Pattern/deviations: Trunk flexed, Decreased stride length       General Gait Details: pt reports weakness and fatigue upon returning to room, "my legs feel like they may give out" and required min assist for stability once back in room due to LOB - "I got my foot caught on the sock" (socks were a little too large for pt)  Stairs            Wheelchair Mobility    Modified Rankin (Stroke Patients Only)       Balance Overall balance assessment: Mild deficits observed, not formally tested                                           Pertinent Vitals/Pain Pain Assessment Pain Assessment:  No/denies pain    Home Living Family/patient expects to be discharged to:: Private residence Living Arrangements: Spouse/significant other Available Help at Discharge: Family;Available 24 hours/day Type of Home: Apartment Home Access: Level entry       Home Layout: One level Home Equipment: Rollator (4 wheels);Shower seat;Cane - single point Additional Comments: reports wife has dementia    Prior Function Prior Level of Function : Independent/Modified Independent             Mobility Comments:  reports mod indep ambulating with rollator; limited distances due to "lower legs give out" without warning, has been happening randomly the past year, typically able to get up from fall, has chairs set around house to keep distance walked short; appears household distances only ADLs Comments: reports indep with ADL/iADLs; does not drive; wife does majority of cooking     Hand Dominance        Extremity/Trunk Assessment        Lower Extremity Assessment Lower Extremity Assessment: Overall WFL for tasks assessed    Cervical / Trunk Assessment Cervical / Trunk Assessment: Kyphotic  Communication   Communication: HOH  Cognition Arousal/Alertness: Awake/alert Behavior During Therapy: Flat affect Overall Cognitive Status: Within Functional Limits for tasks assessed                                          General Comments      Exercises     Assessment/Plan    PT Assessment All further PT needs can be met in the next venue of care  PT Problem List Decreased activity tolerance;Decreased balance;Decreased mobility;Decreased knowledge of use of DME;Decreased strength       PT Treatment Interventions      PT Goals (Current goals can be found in the Care Plan section)  Acute Rehab PT Goals PT Goal Formulation: All assessment and education complete, DC therapy    Frequency       Co-evaluation               AM-PAC PT "6 Clicks" Mobility  Outcome Measure Help needed turning from your back to your side while in a flat bed without using bedrails?: None Help needed moving from lying on your back to sitting on the side of a flat bed without using bedrails?: None Help needed moving to and from a bed to a chair (including a wheelchair)?: A Little Help needed standing up from a chair using your arms (e.g., wheelchair or bedside chair)?: A Little Help needed to walk in hospital room?: A Little Help needed climbing 3-5 steps with a railing? : A Lot 6 Click  Score: 19    End of Session Equipment Utilized During Treatment: Gait belt Activity Tolerance: Patient limited by fatigue Patient left: in bed;with call bell/phone within reach;with bed alarm set Nurse Communication: Mobility status PT Visit Diagnosis: Other abnormalities of gait and mobility (R26.89)    Time: 4332-9518 PT Time Calculation (min) (ACUTE ONLY): 12 min   Charges:   PT Evaluation $PT Eval Low Complexity: 1 Low         Kati PT, DPT Physical Therapist Acute Rehabilitation Services Preferred contact method: Secure Chat Weekend Pager Only: [phone] Office: [phone]  Myrtis Hopping [place] 10/06/2022, 12:06 PM

## 2022-10-07 ENCOUNTER — Other Ambulatory Visit (HOSPITAL_COMMUNITY): Payer: Self-pay | Admitting: Emergency Medicine

## 2022-10-07 NOTE — Progress Notes (Signed)
Paramedicine Encounter    Patient ID: Albert Powell, male    DOB: 08/05/1941, 82 y.o.   MRN: [id number]   Complaints Weakness in legs   Assessment A&O x 4, Skin W&D good color.  No CP or SOB.  Edema noted to left foot  Compliance with meds No  Pill box filled x 1 week  Refills needed: Levothyroxine  Meds changes since last visit Pot Cl 40mg . BID    Social changes None   BP (!) 190/90 (BP Location: Left Arm, Patient Position: Sitting, Cuff Size: Normal)   Pulse 73   Resp 16   SpO2 96%  Weight yesterday-not taken Last visit weight-not  taken  ATF Albert Powell A&O x 4, skin W&D w/ good color.  He denies chest pain or SOB.  Lung sounds clear and equal bilat.  Pitting edema noted to his left foot.  Difficult to palpate a pedal pulse but his foot is warm to touch with good color.  Pt was just discharged yesterday 10/07/22 after a 3 day hospital stay for GI Bleed.  Albert Powell looks much better than he has in quite a while.  He was in no obvious respiratory distress.  He still complains of weakness in his legs and can't stand for long periods of time due to same.  He has not been compliant with his medications.  Med box reconciled x 1 week.  Pot. Cl has been added to his med list 40mg . BID.  This med has not been delivered as of this visit. I called  [facility] and they advised it will be delivered sometime after 3:00 today.  Will follow up to make sure it has been delivered and added to pill box. Also today I reached out to [employer] for technical support on his Cardiomems pillow.  After quite a bit of troubleshooting it was determined a metal lamp on the end table beside his chair was causing interference with the device reading.  Moved device into the bedroom and placed pt supine on the pillow and was able to get a successful reading/transmission. Scheduled pt a follow up visit at [facility] for Feb 2nd @ 10:30.  Will need to have transportation scheduled for same.  Pt. Given a post-it  note with visit information.   Home visit complete.  ACTION: Home visit completed  Feb 4 10/07/22  Patient Care Team: Patient, No Pcp Per as PCP - General (General Practice) [phone], MD as PCP - Cardiology (Cardiology) 10/09/22, MD as Consulting Physician (Cardiology) [name], NP as [facility] Care Management (Geriatric Medicine)  Patient Active Problem List   Diagnosis Date Noted   Hyponatremia 10/03/2022   Constipation 10/03/2022   Proctitis 10/03/2022   HLD (hyperlipidemia) 10/03/2022   Stage 3b chronic kidney disease (CKD) ([name]) 10/03/2022   GIB (gastrointestinal bleeding) 10/03/2022   Nausea and vomiting 09/18/2022   (HFpEF) heart failure with preserved ejection fraction ([name]) 09/18/2022   AKI (acute kidney injury) ([name]) 09/06/2022   Chronic combined systolic and diastolic CHF (congestive heart failure) ([name]) 09/04/2022   Near syncope 09/04/2022   Hypokalemia 06/23/2022   Elevated troponin 06/23/2022   Type 2 diabetes mellitus with hyperlipidemia ([name]) 05/19/2022   Acute combined systolic and diastolic heart failure ([name])    Acute kidney injury superimposed on chronic kidney disease ([name]) 05/13/2022   GERD (gastroesophageal reflux disease) 05/13/2022   Acquired hypothyroidism 05/13/2022   Paroxysmal atrial fibrillation ([name]) 10/08/2021   Acute metabolic  encephalopathy    CHF (congestive heart failure) ([place]) 03/10/2021   Essential hypertension 03/10/2021   Hypertensive crisis 01/01/2021   ACS (acute coronary syndrome) ([name])    Lesion of right native kidney    Atrial fibrillation ([place]) 03/30/2020   Contusion of right hand 11/12/2018   Primary osteoarthritis of both first carpometacarpal joints 11/12/2018   Pain in joint of right shoulder 10/05/2018   Pain in right hand 10/05/2018   Hematoma 03/30/2018   Degeneration of lumbar intervertebral disc 12/21/2017   Lumbar radiculopathy 12/21/2017    Vertigo 04/09/2017   Hypogonadism in male 12/07/2014   Screening for prostate cancer 12/07/2014   Claudication of right lower extremity ([place]) 10/20/2013   Claudication in peripheral vascular disease- Rt leg 09/20/2013   Obesity (BMI 30-39.9)- negative sleep study in the past 09/20/2013   Occlusion and stenosis of carotid artery without mention of cerebral infarction 05/23/2013   PAD (peripheral artery disease) ([place]) 05/18/2013   Carotid artery disease ([place]) 04/14/2013   Bradycardia, sinus 02/18/2013   Tobacco abuse 02/05/2013   Chest pain, unstable angina, negative MI 02/04/2013   Coronary artery disease involving native coronary artery of native heart without angina pectoris 02/04/2013   Benign essential hypertension 07/10/2011   Incisional hernia 05/22/2011    Current Outpatient Medications:    amiodarone (PACERONE) 200 MG tablet, Take 1 tablet (200 mg total) by mouth 2 (two) times daily., Disp: 60 tablet, Rfl: 3   amLODipine (NORVASC) 10 MG tablet, Take 1 tablet (10 mg total) by mouth daily., Disp: 30 tablet, Rfl: 11   apixaban (ELIQUIS) 2.5 MG TABS tablet, Take 1 tablet (2.5 mg total) by mouth 2 (two) times daily., Disp: 60 tablet, Rfl: 3   atorvastatin (LIPITOR) 40 MG tablet, Take 1 tablet (40 mg total) by mouth daily., Disp: 30 tablet, Rfl: 11   clopidogrel (PLAVIX) 75 MG tablet, Take 1 tablet (75 mg total) by mouth daily., Disp: 30 tablet, Rfl: 11   glipiZIDE (GLUCOTROL) 5 MG tablet, Take 5 mg by mouth daily before breakfast., Disp: , Rfl:    hydrALAZINE (APRESOLINE) 100 MG tablet, Take 1 tablet (100 mg total) by mouth 3 (three) times daily., Disp: 90 tablet, Rfl: 0   isosorbide mononitrate (IMDUR) 120 MG 24 hr tablet, Take 1 tablet (120 mg total) by mouth daily., Disp: 30 tablet, Rfl: 11   levothyroxine (SYNTHROID) 25 MCG tablet, TAKE ONE TABLET BY MOUTH DAILY BEFORE BREAKFAST (Patient taking differently: Take 75 mcg by mouth daily before breakfast.), Disp: 90 tablet, Rfl: 3    nystatin (MYCOSTATIN/NYSTOP) powder, Apply topically 2 (two) times daily., Disp: 15 g, Rfl: 0   ondansetron (ZOFRAN-ODT) 4 MG disintegrating tablet, Take 1 tablet (4 mg total) by mouth every 8 (eight) hours as needed for up to 15 doses for nausea or vomiting., Disp: 15 tablet, Rfl: 0   pantoprazole (PROTONIX) 40 MG tablet, Take 1 tablet (40 mg total) by mouth every other day., Disp: 30 tablet, Rfl: 3   Multiple Vitamin (MULTIVITAMIN WITH MINERALS) TABS tablet, Take 1 tablet by mouth daily. (Patient not taking: Reported on 10/01/2022), Disp: 30 tablet, Rfl: 0   potassium chloride SA (KLOR-CON M) 20 MEQ tablet, Take 2 tablets (40 mEq total) by mouth 2 (two) times daily., Disp: 18 tablet, Rfl: 0 Allergies  Allergen Reactions   Fentanyl Other (See Comments)    Behavioral changes   Gabapentin Swelling   Lisinopril Other (See Comments)    Unknown reaction   Lyrica [Pregabalin] Other (See Comments)  Unknown reaction   Metformin Diarrhea   Propofol Other (See Comments)    Heart rate dropped     Social History   Socioeconomic History   Marital status: Married    Spouse name: Ghassan Coggeshall   Number of children: 1   Years of education: Not on file   Highest education level: High school graduate  Occupational History   Occupation: retired  Tobacco Use   Smoking status: Former    Packs/day: 1.50    Years: 57.00    Total pack years: 85.50    Types: Cigarettes, E-cigarettes    Quit date: 2020    Years since quitting: 4.0   Smokeless tobacco: Never   Tobacco comments:    pt states that he is using the vapor cigs--12/2021  Vaping Use   Vaping Use: Former   Start date: 09/15/2016   Substances: Nicotine  Substance and Sexual Activity   Alcohol use: Yes    Alcohol/week: 1.0 standard drink of alcohol    Types: 1 Glasses of wine per week    Comment: "very little"   Drug use: No   Sexual activity: Not on file  Other Topics Concern   Not on file  Social History Narrative   Not on  file   Social Determinants of Health   Financial Resource Strain: Low Risk  (06/03/2022)   Overall Financial Resource Strain (CARDIA)    Difficulty of Paying Living Expenses: Not hard at all  Food Insecurity: No Food Insecurity (10/03/2022)   Hunger Vital Sign    Worried About Running Out of Food in the Last Year: Never true    Ran Out of Food in the Last Year: Never true  Transportation Needs: No Transportation Needs (10/03/2022)   PRAPARE - Hydrologist (Medical): No    Lack of Transportation (Non-Medical): No  Recent Concern: Transportation Needs - Unmet Transportation Needs (08/21/2022)   PRAPARE - Hydrologist (Medical): Yes    Lack of Transportation (Non-Medical): No  Physical Activity: Not on file  Stress: Not on file  Social Connections: Not on file  Intimate Partner Violence: Not At Risk (10/03/2022)   Humiliation, Afraid, Rape, and Kick questionnaire    Fear of Current or Ex-Partner: No    Emotionally Abused: No    Physically Abused: No    Sexually Abused: No    Physical Exam      Future Appointments  Date Time Provider [place]  10/20/2022 10:30 AM [facility] PA/NP [facility] None

## 2022-10-08 ENCOUNTER — Telehealth (HOSPITAL_COMMUNITY): Payer: Self-pay | Admitting: Emergency Medicine

## 2022-10-08 ENCOUNTER — Telehealth (HOSPITAL_COMMUNITY): Payer: Self-pay | Admitting: Licensed Clinical Social Worker

## 2022-10-08 ENCOUNTER — Other Ambulatory Visit (HOSPITAL_COMMUNITY): Payer: Self-pay | Admitting: Emergency Medicine

## 2022-10-08 NOTE — Telephone Encounter (Signed)
H&V Care Navigation CSW Progress Note  Clinical Social Worker contacted by paramedic to assist with getting pt back established with PCP after he had to miss initial appt due to medical concerns.  CSW had appt rescheduled to 1/31 at 10:15am  Transport set up through [place] with [employer] [place] up from home 8:45am-9:15am Pick up from appt 11:45am from Entrance A of hospital   [place]: No Food Insecurity (10/03/2022)  Housing: Low Risk  (10/03/2022)  Transportation Needs: No Transportation Needs (10/03/2022)  Recent Concern: Transportation Needs - Unmet Transportation Needs (08/21/2022)  Utilities: Not At Risk (10/03/2022)  Alcohol Screen: Low Risk  (06/03/2022)  Depression (PHQ2-9): High Risk (08/14/2022)  Financial Resource Strain: Low Risk  (06/03/2022)  Tobacco Use: Medium Risk (10/03/2022)    [name], [place] [facility] Desk#: [phone] Cell#: [phone]

## 2022-10-08 NOTE — Telephone Encounter (Signed)
Albert Powell called this morning regarding Albert Powell meds.  Potassium Cl was supposed to be delivered last evening from [facility].  She referenced "Losartan" which he is not taking and then started talking about trying to find his Potassium.  I advised her I would come out immediately to assist her with meds to insure the right med is added correctly.    [name], [place] 10/08/2022

## 2022-10-08 NOTE — Telephone Encounter (Signed)
Reached out to [employer] w/ [facility] regarding [name] and attempting to get him scheduled with PCP.  He missed his last appointment due to hospitalization.  This appointment needs to be rescheduled. LVM for her to call me back.    [name], [place] 10/08/2022

## 2022-10-08 NOTE — Progress Notes (Signed)
Came by to assist Mrs. Quain in adding Potassium Cl 40mg . BID.  Qty. 18 added to pill box last dose ending Monday 10/13/22 a.m.  Mrs. Marti had gotten into an old bottle of Losartan and started to put them in his pill box.  I instructed her that if it's not already in the pill box not to give it to him.  She verbalized she understands  Cardiomems  reading obtained and transmitted successfully.  [name], [place] 10/08/2022

## 2022-10-09 ENCOUNTER — Other Ambulatory Visit (HOSPITAL_COMMUNITY): Payer: Self-pay | Admitting: Emergency Medicine

## 2022-10-09 NOTE — Telephone Encounter (Signed)
CREATED IN ERROR

## 2022-10-09 NOTE — Progress Notes (Signed)
Paramedicine Encounter    Patient ID: Albert Powell, male    DOB: 1940/11/25, 82 y.o.   MRN: [id number]   Complaints Nausea, dry heaves  Assessment A&O x 4, skin warm to touch, no chest pain, SOB.  No peripheral edema, no abdominal distention  Compliance with meds Took a.m. and p.m. regimen of meds yesterday and took this a.m. while I was there  Pill box filled not reconciled  Refills needed none  Meds changes since last visit none    Social changes none   BP (!) 210/90 (BP Location: Left Arm, Patient Position: Sitting, Cuff Size: Normal)   Pulse 80   Wt 193 lb 3.2 oz (87.6 kg)   BMI 28.53 kg/m  Weight yesterday-193.2lb  Last visit weight-193.3lb [id number]   Did another visit with Albert Powell today to follow up from yesterday's visit.  He is still weak.  He complains of nausea and has dry heaves.  He took a prescribed Zofran 4mg . ODT tablet while I was there.  He has excessive thirst and is drinking copious amounts of water.  His abdomen in non-tender or rigid.  Last bowel movement was yestereday "in between loose and solid w/ no obvious signs of blood".  Overall he looks better than yesterday but is still easily winded w/ any exertion.  Still has some edema noted to his left foot but legs look good.  His wife states that he is not eating much.  Encouraged a bland diet at very least.  Advised him that taking his meds on an empty stomach could contribute to the nausea.  Also suggested he go from the bedroom to the living room w/ his walker at least once or twice a day.   Able to assist him with another Cardiomems reading today and transmitted same successfully.  He received a call from the [facility] today letting him know they are receiving his readings and currently everything lookds good.  I will continue to assist him with these readings when I do visits.  I explained to him again the purpose/benefits of the Cardiomems device.  Current reading are obtained in a supine position in  his bed at home.   ACTION: Home visit completed  Bethanie Dicker 10/09/22  Patient Care Team: Patient, No Pcp Per as PCP - General (General Practice) 10/11/22, MD as PCP - Cardiology (Cardiology) [name], MD as Consulting Physician (Cardiology) [name], NP as [facility] Care Management (Geriatric Medicine)  Patient Active Problem List   Diagnosis Date Noted   Hyponatremia 10/03/2022   Constipation 10/03/2022   Proctitis 10/03/2022   HLD (hyperlipidemia) 10/03/2022   Stage 3b chronic kidney disease (CKD) ([name]) 10/03/2022   GIB (gastrointestinal bleeding) 10/03/2022   Nausea and vomiting 09/18/2022   (HFpEF) heart failure with preserved ejection fraction ([name]) 09/18/2022   AKI (acute kidney injury) ([name]) 09/06/2022   Chronic combined systolic and diastolic CHF (congestive heart failure) ([name]) 09/04/2022   Near syncope 09/04/2022   Hypokalemia 06/23/2022   Elevated troponin 06/23/2022   Type 2 diabetes mellitus with hyperlipidemia ([name]) 05/19/2022   Acute combined systolic and diastolic heart failure ([name])    Acute kidney injury superimposed on chronic kidney disease ([name]) 05/13/2022   GERD (gastroesophageal reflux disease) 05/13/2022   Acquired hypothyroidism 05/13/2022   Paroxysmal atrial fibrillation ([name]) 10/08/2021   Acute metabolic encephalopathy    CHF (congestive heart failure) ([name]) 03/10/2021   Essential hypertension 03/10/2021   Hypertensive crisis 01/01/2021  ACS (acute coronary syndrome) ([name])    Lesion of right native kidney    Atrial fibrillation ([place]) 03/30/2020   Contusion of right hand 11/12/2018   Primary osteoarthritis of both first carpometacarpal joints 11/12/2018   Pain in joint of right shoulder 10/05/2018   Pain in right hand 10/05/2018   Hematoma 03/30/2018   Degeneration of lumbar intervertebral disc 12/21/2017   Lumbar radiculopathy 12/21/2017   Vertigo 04/09/2017    Hypogonadism in male 12/07/2014   Screening for prostate cancer 12/07/2014   Claudication of right lower extremity ([place]) 10/20/2013   Claudication in peripheral vascular disease- Rt leg 09/20/2013   Obesity (BMI 30-39.9)- negative sleep study in the past 09/20/2013   Occlusion and stenosis of carotid artery without mention of cerebral infarction 05/23/2013   PAD (peripheral artery disease) ([place]) 05/18/2013   Carotid artery disease ([place]) 04/14/2013   Bradycardia, sinus 02/18/2013   Tobacco abuse 02/05/2013   Chest pain, unstable angina, negative MI 02/04/2013   Coronary artery disease involving native coronary artery of native heart without angina pectoris 02/04/2013   Benign essential hypertension 07/10/2011   Incisional hernia 05/22/2011    Current Outpatient Medications:    amiodarone (PACERONE) 200 MG tablet, Take 1 tablet (200 mg total) by mouth 2 (two) times daily., Disp: 60 tablet, Rfl: 3   amLODipine (NORVASC) 10 MG tablet, Take 1 tablet (10 mg total) by mouth daily., Disp: 30 tablet, Rfl: 11   apixaban (ELIQUIS) 2.5 MG TABS tablet, Take 1 tablet (2.5 mg total) by mouth 2 (two) times daily., Disp: 60 tablet, Rfl: 3   atorvastatin (LIPITOR) 40 MG tablet, Take 1 tablet (40 mg total) by mouth daily., Disp: 30 tablet, Rfl: 11   clopidogrel (PLAVIX) 75 MG tablet, Take 1 tablet (75 mg total) by mouth daily., Disp: 30 tablet, Rfl: 11   glipiZIDE (GLUCOTROL) 5 MG tablet, Take 5 mg by mouth daily before breakfast., Disp: , Rfl:    hydrALAZINE (APRESOLINE) 100 MG tablet, Take 1 tablet (100 mg total) by mouth 3 (three) times daily., Disp: 90 tablet, Rfl: 0   isosorbide mononitrate (IMDUR) 120 MG 24 hr tablet, Take 1 tablet (120 mg total) by mouth daily., Disp: 30 tablet, Rfl: 11   levothyroxine (SYNTHROID) 25 MCG tablet, TAKE ONE TABLET BY MOUTH DAILY BEFORE BREAKFAST (Patient taking differently: Take 75 mcg by mouth daily before breakfast.), Disp: 90 tablet, Rfl: 3   Multiple Vitamin  (MULTIVITAMIN WITH MINERALS) TABS tablet, Take 1 tablet by mouth daily. (Patient not taking: Reported on 10/01/2022), Disp: 30 tablet, Rfl: 0   nystatin (MYCOSTATIN/NYSTOP) powder, Apply topically 2 (two) times daily., Disp: 15 g, Rfl: 0   ondansetron (ZOFRAN-ODT) 4 MG disintegrating tablet, Take 1 tablet (4 mg total) by mouth every 8 (eight) hours as needed for up to 15 doses for nausea or vomiting., Disp: 15 tablet, Rfl: 0   pantoprazole (PROTONIX) 40 MG tablet, Take 1 tablet (40 mg total) by mouth every other day., Disp: 30 tablet, Rfl: 3   potassium chloride SA (KLOR-CON M) 20 MEQ tablet, Take 2 tablets (40 mEq total) by mouth 2 (two) times daily., Disp: 18 tablet, Rfl: 0 Allergies  Allergen Reactions   Fentanyl Other (See Comments)    Behavioral changes   Gabapentin Swelling   Lisinopril Other (See Comments)    Unknown reaction   Lyrica [Pregabalin] Other (See Comments)    Unknown reaction   Metformin Diarrhea   Propofol Other (See Comments)    Heart rate dropped  Social History   Socioeconomic History   Marital status: Married    Spouse name: Sher Hellinger   Number of children: 1   Years of education: Not on file   Highest education level: High school graduate  Occupational History   Occupation: retired  Tobacco Use   Smoking status: Former    Packs/day: 1.50    Years: 57.00    Total pack years: 85.50    Types: Cigarettes, E-cigarettes    Quit date: 2020    Years since quitting: 4.0   Smokeless tobacco: Never   Tobacco comments:    pt states that he is using the vapor cigs--12/2021  Vaping Use   Vaping Use: Former   Start date: 09/15/2016   Substances: Nicotine  Substance and Sexual Activity   Alcohol use: Yes    Alcohol/week: 1.0 standard drink of alcohol    Types: 1 Glasses of wine per week    Comment: "very little"   Drug use: No   Sexual activity: Not on file  Other Topics Concern   Not on file  Social History Narrative   Not on file   Social  Determinants of Health   Financial Resource Strain: Low Risk  (06/03/2022)   Overall Financial Resource Strain (CARDIA)    Difficulty of Paying Living Expenses: Not hard at all  Food Insecurity: No Food Insecurity (10/03/2022)   Hunger Vital Sign    Worried About Running Out of Food in the Last Year: Never true    Ran Out of Food in the Last Year: Never true  Transportation Needs: Unmet Transportation Needs (10/08/2022)   PRAPARE - Hydrologist (Medical): Yes    Lack of Transportation (Non-Medical): No  Physical Activity: Not on file  Stress: Not on file  Social Connections: Not on file  Intimate Partner Violence: Not At Risk (10/03/2022)   Humiliation, Afraid, Rape, and Kick questionnaire    Fear of Current or Ex-Partner: No    Emotionally Abused: No    Physically Abused: No    Sexually Abused: No    Physical Exam      Future Appointments  Date Time Provider [place]  10/15/2022 10:15 AM Rick Duff, MD [facility]  10/20/2022 10:30 AM [facility] PA/NP [facility] None

## 2022-10-14 ENCOUNTER — Telehealth (HOSPITAL_COMMUNITY): Payer: Self-pay | Admitting: Emergency Medicine

## 2022-10-14 NOTE — Telephone Encounter (Signed)
Called and LVM regarding appointment tomorrow with PCP.  Transportation will p/u between 8:45- 9:15.      [name], [place] 10/14/2022

## 2022-10-14 NOTE — Telephone Encounter (Signed)
After multiple unsuccessful calls to the [facility] I reached out to their daughter Presley Raddle to give her the information for his appointment  and transportation.  She advised she will try and reach out to them again with this information.      [name], [place] 10/14/2022

## 2022-10-15 ENCOUNTER — Telehealth (HOSPITAL_COMMUNITY): Payer: Self-pay | Admitting: Emergency Medicine

## 2022-10-15 ENCOUNTER — Ambulatory Visit: Payer: Medicare Other | Admitting: Student

## 2022-10-15 NOTE — Telephone Encounter (Signed)
At least 5 phone calls made at various times today in attempts to reach Mr. Agostino regarding his appointment today.  Calls went straight to VM.  LVM x 2.  Also spoke with daughter Presley Raddle who also says she has attempted to call.    [name], [place] 10/15/2022

## 2022-10-16 ENCOUNTER — Other Ambulatory Visit (HOSPITAL_COMMUNITY): Payer: Self-pay | Admitting: Emergency Medicine

## 2022-10-16 ENCOUNTER — Emergency Department (HOSPITAL_COMMUNITY): Payer: Medicare Other

## 2022-10-16 ENCOUNTER — Inpatient Hospital Stay (HOSPITAL_COMMUNITY)
Admission: EM | Admit: 2022-10-16 | Discharge: 2022-10-21 | DRG: 641 | Disposition: A | Discharge status: Skilled Nursing Facility | Payer: Medicare Other | Attending: Internal Medicine | Admitting: Internal Medicine

## 2022-10-16 DIAGNOSIS — E785 Hyperlipidemia, unspecified: Secondary | ICD-10-CM | POA: Diagnosis present

## 2022-10-16 DIAGNOSIS — Z955 Presence of coronary angioplasty implant and graft: Secondary | ICD-10-CM | POA: Diagnosis not present

## 2022-10-16 DIAGNOSIS — Z885 Allergy status to narcotic agent status: Secondary | ICD-10-CM

## 2022-10-16 DIAGNOSIS — I482 Chronic atrial fibrillation, unspecified: Secondary | ICD-10-CM | POA: Diagnosis present

## 2022-10-16 DIAGNOSIS — N1832 Chronic kidney disease, stage 3b: Secondary | ICD-10-CM | POA: Diagnosis present

## 2022-10-16 DIAGNOSIS — I480 Paroxysmal atrial fibrillation: Secondary | ICD-10-CM | POA: Diagnosis not present

## 2022-10-16 DIAGNOSIS — M6281 Muscle weakness (generalized): Secondary | ICD-10-CM | POA: Diagnosis not present

## 2022-10-16 DIAGNOSIS — E1151 Type 2 diabetes mellitus with diabetic peripheral angiopathy without gangrene: Secondary | ICD-10-CM | POA: Diagnosis not present

## 2022-10-16 DIAGNOSIS — Z7901 Long term (current) use of anticoagulants: Secondary | ICD-10-CM

## 2022-10-16 DIAGNOSIS — J449 Chronic obstructive pulmonary disease, unspecified: Secondary | ICD-10-CM | POA: Diagnosis present

## 2022-10-16 DIAGNOSIS — I5042 Chronic combined systolic (congestive) and diastolic (congestive) heart failure: Secondary | ICD-10-CM | POA: Diagnosis present

## 2022-10-16 DIAGNOSIS — E1122 Type 2 diabetes mellitus with diabetic chronic kidney disease: Secondary | ICD-10-CM | POA: Insufficient documentation

## 2022-10-16 DIAGNOSIS — M6259 Muscle wasting and atrophy, not elsewhere classified, multiple sites: Secondary | ICD-10-CM | POA: Diagnosis not present

## 2022-10-16 DIAGNOSIS — E871 Hypo-osmolality and hyponatremia: Secondary | ICD-10-CM | POA: Diagnosis not present

## 2022-10-16 DIAGNOSIS — R1311 Dysphagia, oral phase: Secondary | ICD-10-CM | POA: Diagnosis not present

## 2022-10-16 DIAGNOSIS — E876 Hypokalemia: Secondary | ICD-10-CM | POA: Diagnosis not present

## 2022-10-16 DIAGNOSIS — I130 Hypertensive heart and chronic kidney disease with heart failure and stage 1 through stage 4 chronic kidney disease, or unspecified chronic kidney disease: Secondary | ICD-10-CM | POA: Diagnosis present

## 2022-10-16 DIAGNOSIS — Z8249 Family history of ischemic heart disease and other diseases of the circulatory system: Secondary | ICD-10-CM

## 2022-10-16 DIAGNOSIS — E039 Hypothyroidism, unspecified: Secondary | ICD-10-CM | POA: Insufficient documentation

## 2022-10-16 DIAGNOSIS — N179 Acute kidney failure, unspecified: Secondary | ICD-10-CM | POA: Diagnosis present

## 2022-10-16 DIAGNOSIS — I2510 Atherosclerotic heart disease of native coronary artery without angina pectoris: Secondary | ICD-10-CM | POA: Diagnosis present

## 2022-10-16 DIAGNOSIS — R110 Nausea: Secondary | ICD-10-CM | POA: Diagnosis not present

## 2022-10-16 DIAGNOSIS — R112 Nausea with vomiting, unspecified: Secondary | ICD-10-CM | POA: Diagnosis not present

## 2022-10-16 DIAGNOSIS — E861 Hypovolemia: Secondary | ICD-10-CM | POA: Diagnosis present

## 2022-10-16 DIAGNOSIS — E860 Dehydration: Secondary | ICD-10-CM | POA: Diagnosis present

## 2022-10-16 DIAGNOSIS — F0150 Vascular dementia without behavioral disturbance: Secondary | ICD-10-CM | POA: Diagnosis not present

## 2022-10-16 DIAGNOSIS — K219 Gastro-esophageal reflux disease without esophagitis: Secondary | ICD-10-CM | POA: Diagnosis not present

## 2022-10-16 DIAGNOSIS — Z7902 Long term (current) use of antithrombotics/antiplatelets: Secondary | ICD-10-CM

## 2022-10-16 DIAGNOSIS — Z79899 Other long term (current) drug therapy: Secondary | ICD-10-CM

## 2022-10-16 DIAGNOSIS — Z87891 Personal history of nicotine dependence: Secondary | ICD-10-CM | POA: Diagnosis not present

## 2022-10-16 DIAGNOSIS — R531 Weakness: Secondary | ICD-10-CM | POA: Diagnosis not present

## 2022-10-16 DIAGNOSIS — N189 Chronic kidney disease, unspecified: Secondary | ICD-10-CM | POA: Diagnosis not present

## 2022-10-16 DIAGNOSIS — R2681 Unsteadiness on feet: Secondary | ICD-10-CM | POA: Diagnosis not present

## 2022-10-16 DIAGNOSIS — R42 Dizziness and giddiness: Secondary | ICD-10-CM | POA: Diagnosis not present

## 2022-10-16 DIAGNOSIS — R9082 White matter disease, unspecified: Secondary | ICD-10-CM | POA: Diagnosis not present

## 2022-10-16 DIAGNOSIS — Z825 Family history of asthma and other chronic lower respiratory diseases: Secondary | ICD-10-CM

## 2022-10-16 DIAGNOSIS — Z83438 Family history of other disorder of lipoprotein metabolism and other lipidemia: Secondary | ICD-10-CM

## 2022-10-16 DIAGNOSIS — R1111 Vomiting without nausea: Secondary | ICD-10-CM | POA: Diagnosis not present

## 2022-10-16 DIAGNOSIS — Z888 Allergy status to other drugs, medicaments and biological substances status: Secondary | ICD-10-CM | POA: Diagnosis not present

## 2022-10-16 DIAGNOSIS — I10 Essential (primary) hypertension: Secondary | ICD-10-CM | POA: Diagnosis not present

## 2022-10-16 DIAGNOSIS — M25551 Pain in right hip: Secondary | ICD-10-CM | POA: Diagnosis not present

## 2022-10-16 DIAGNOSIS — Z741 Need for assistance with personal care: Secondary | ICD-10-CM | POA: Diagnosis not present

## 2022-10-16 DIAGNOSIS — Z79890 Hormone replacement therapy (postmenopausal): Secondary | ICD-10-CM

## 2022-10-16 DIAGNOSIS — Z823 Family history of stroke: Secondary | ICD-10-CM

## 2022-10-16 DIAGNOSIS — R41841 Cognitive communication deficit: Secondary | ICD-10-CM | POA: Diagnosis not present

## 2022-10-16 DIAGNOSIS — Z833 Family history of diabetes mellitus: Secondary | ICD-10-CM

## 2022-10-16 DIAGNOSIS — E119 Type 2 diabetes mellitus without complications: Secondary | ICD-10-CM | POA: Diagnosis not present

## 2022-10-16 LAB — COMPREHENSIVE METABOLIC PANEL
ALT: 24 U/L (ref 0–44)
AST: 22 U/L (ref 15–41)
Albumin: 2.4 g/dL — ABNORMAL LOW (ref 3.5–5.0)
Alkaline Phosphatase: 87 U/L (ref 38–126)
Anion gap: 11 (ref 5–15)
BUN: 11 mg/dL (ref 8–23)
CO2: 22 mmol/L (ref 22–32)
Calcium: 7.9 mg/dL — ABNORMAL LOW (ref 8.9–10.3)
Chloride: 99 mmol/L (ref 98–111)
Creatinine, Ser: 1.66 mg/dL — ABNORMAL HIGH (ref 0.61–1.24)
GFR, Estimated: 41 mL/min — ABNORMAL LOW (ref >60)
Glucose, Bld: 102 mg/dL — ABNORMAL HIGH (ref 70–99)
Potassium: 2.2 mmol/L — CL (ref 3.5–5.1)
Sodium: 132 mmol/L — ABNORMAL LOW (ref 135–145)
Total Bilirubin: 0.8 mg/dL (ref 0.3–1.2)
Total Protein: 5.3 g/dL — ABNORMAL LOW (ref 6.5–8.1)

## 2022-10-16 LAB — URINALYSIS, ROUTINE W REFLEX MICROSCOPIC
Bilirubin Urine: NEGATIVE
Glucose, UA: 50 mg/dL — AB
Ketones, ur: NEGATIVE mg/dL
Leukocytes,Ua: NEGATIVE
Nitrite: NEGATIVE
Protein, ur: >=300 mg/dL — AB
Specific Gravity, Urine: 1.004 — ABNORMAL LOW (ref 1.005–1.030)
pH: 7 (ref 5.0–8.0)

## 2022-10-16 LAB — CBG MONITORING, ED: Glucose-Capillary: 125 mg/dL — ABNORMAL HIGH (ref 70–99)

## 2022-10-16 LAB — CBC WITH DIFFERENTIAL/PLATELET
Abs Immature Granulocytes: 0.15 10*3/uL — ABNORMAL HIGH (ref 0.00–0.07)
Basophils Absolute: 0 10*3/uL (ref 0.0–0.1)
Basophils Relative: 1 %
Eosinophils Absolute: 0 10*3/uL (ref 0.0–0.5)
Eosinophils Relative: 1 %
HCT: 41.4 % (ref 39.0–52.0)
Hemoglobin: 14.6 g/dL (ref 13.0–17.0)
Immature Granulocytes: 2 %
Lymphocytes Relative: 12 %
Lymphs Abs: 1 10*3/uL (ref 0.7–4.0)
MCH: 30.2 pg (ref 26.0–34.0)
MCHC: 35.3 g/dL (ref 30.0–36.0)
MCV: 85.7 fL (ref 80.0–100.0)
Monocytes Absolute: 0.7 10*3/uL (ref 0.1–1.0)
Monocytes Relative: 8 %
Neutro Abs: 6.4 10*3/uL (ref 1.7–7.7)
Neutrophils Relative %: 76 %
Platelets: 294 10*3/uL (ref 150–400)
RBC: 4.83 MIL/uL (ref 4.22–5.81)
RDW: 16.3 % — ABNORMAL HIGH (ref 11.5–15.5)
WBC: 8.3 10*3/uL (ref 4.0–10.5)
nRBC: 0 % (ref 0.0–0.2)

## 2022-10-16 LAB — TROPONIN I (HIGH SENSITIVITY): Troponin I (High Sensitivity): 32 ng/L — ABNORMAL HIGH (ref <18)

## 2022-10-16 MED ORDER — ACETAMINOPHEN 650 MG RE SUPP: 650.0000 mg | Freq: Four times a day (QID) | RECTAL | Status: DC | PRN

## 2022-10-16 MED ORDER — APIXABAN 2.5 MG PO TABS
2.5000 mg | ORAL_TABLET | Freq: Two times a day (BID) | ORAL | Status: DC
  Administered 2022-10-17 – 2022-10-21 (×10): 2.5 mg via ORAL
  Filled 2022-10-16 (×10): qty 1

## 2022-10-16 MED ORDER — SODIUM CHLORIDE 0.9 % IV BOLUS
1000.0000 mL | Freq: Once | INTRAVENOUS | Status: AC
  Administered 2022-10-16: 1000 mL via INTRAVENOUS

## 2022-10-16 MED ORDER — GLIPIZIDE 5 MG PO TABS
5.0000 mg | ORAL_TABLET | Freq: Every day | ORAL | Status: DC
  Administered 2022-10-17 – 2022-10-21 (×4): 5 mg via ORAL
  Filled 2022-10-16 (×6): qty 1

## 2022-10-16 MED ORDER — POTASSIUM CHLORIDE IN NACL 20-0.9 MEQ/L-% IV SOLN
INTRAVENOUS | Status: DC
  Filled 2022-10-16 (×2): qty 1000

## 2022-10-16 MED ORDER — ONDANSETRON 4 MG PO TBDP: 4.0000 mg | ORAL_TABLET | Freq: Three times a day (TID) | ORAL | Status: DC | PRN

## 2022-10-16 MED ORDER — POTASSIUM CHLORIDE 10 MEQ/100ML IV SOLN
10.0000 meq | INTRAVENOUS | Status: AC
  Administered 2022-10-16 – 2022-10-17 (×6): 10 meq via INTRAVENOUS
  Filled 2022-10-16 (×6): qty 100

## 2022-10-16 MED ORDER — ATORVASTATIN CALCIUM 40 MG PO TABS
40.0000 mg | ORAL_TABLET | Freq: Every day | ORAL | Status: DC
  Administered 2022-10-17 – 2022-10-21 (×5): 40 mg via ORAL
  Filled 2022-10-16 (×5): qty 1

## 2022-10-16 MED ORDER — ISOSORBIDE MONONITRATE ER 60 MG PO TB24
120.0000 mg | ORAL_TABLET | Freq: Every day | ORAL | Status: DC
  Administered 2022-10-17 – 2022-10-21 (×5): 120 mg via ORAL
  Filled 2022-10-16 (×5): qty 2

## 2022-10-16 MED ORDER — ENOXAPARIN SODIUM 40 MG/0.4ML IJ SOSY: 40.0000 mg | PREFILLED_SYRINGE | INTRAMUSCULAR | Status: DC

## 2022-10-16 MED ORDER — ONDANSETRON HCL 4 MG/2ML IJ SOLN
4.0000 mg | Freq: Once | INTRAMUSCULAR | Status: AC
  Administered 2022-10-16: 4 mg via INTRAVENOUS
  Filled 2022-10-16: qty 2

## 2022-10-16 MED ORDER — MAGNESIUM HYDROXIDE 400 MG/5ML PO SUSP: 30.0000 mL | Freq: Every day | ORAL | Status: DC | PRN

## 2022-10-16 MED ORDER — HYDRALAZINE HCL 50 MG PO TABS
100.0000 mg | ORAL_TABLET | Freq: Three times a day (TID) | ORAL | Status: DC
  Administered 2022-10-17 – 2022-10-21 (×14): 100 mg via ORAL
  Filled 2022-10-16 (×14): qty 2

## 2022-10-16 MED ORDER — POTASSIUM CHLORIDE CRYS ER 20 MEQ PO TBCR
40.0000 meq | EXTENDED_RELEASE_TABLET | Freq: Two times a day (BID) | ORAL | Status: DC
  Administered 2022-10-17: 40 meq via ORAL
  Filled 2022-10-16: qty 2

## 2022-10-16 MED ORDER — PANTOPRAZOLE SODIUM 40 MG PO TBEC
40.0000 mg | DELAYED_RELEASE_TABLET | ORAL | Status: DC
  Administered 2022-10-17 – 2022-10-21 (×3): 40 mg via ORAL
  Filled 2022-10-16 (×5): qty 1

## 2022-10-16 MED ORDER — AMLODIPINE BESYLATE 10 MG PO TABS
10.0000 mg | ORAL_TABLET | Freq: Every day | ORAL | Status: DC
  Administered 2022-10-17 – 2022-10-21 (×5): 10 mg via ORAL
  Filled 2022-10-16 (×5): qty 1

## 2022-10-16 MED ORDER — ONDANSETRON HCL 4 MG PO TABS: 4.0000 mg | ORAL_TABLET | Freq: Four times a day (QID) | ORAL | Status: DC | PRN

## 2022-10-16 MED ORDER — ONDANSETRON HCL 4 MG/2ML IJ SOLN
4.0000 mg | Freq: Four times a day (QID) | INTRAMUSCULAR | Status: DC | PRN
  Administered 2022-10-19: 4 mg via INTRAVENOUS
  Filled 2022-10-16: qty 2

## 2022-10-16 MED ORDER — NYSTATIN 100000 UNIT/GM EX POWD
Freq: Two times a day (BID) | CUTANEOUS | Status: DC
  Administered 2022-10-20: 1 via TOPICAL
  Filled 2022-10-16: qty 15

## 2022-10-16 MED ORDER — TRAZODONE HCL 50 MG PO TABS
25.0000 mg | ORAL_TABLET | Freq: Every evening | ORAL | Status: DC | PRN
  Administered 2022-10-17 – 2022-10-20 (×2): 25 mg via ORAL
  Filled 2022-10-16 (×3): qty 1

## 2022-10-16 MED ORDER — POTASSIUM CHLORIDE 20 MEQ PO PACK
40.0000 meq | PACK | Freq: Once | ORAL | Status: AC
  Administered 2022-10-17: 40 meq via ORAL
  Filled 2022-10-16: qty 2

## 2022-10-16 MED ORDER — LEVOTHYROXINE SODIUM 75 MCG PO TABS
75.0000 ug | ORAL_TABLET | Freq: Every day | ORAL | Status: DC
  Administered 2022-10-17 – 2022-10-21 (×5): 75 ug via ORAL
  Filled 2022-10-16 (×5): qty 1

## 2022-10-16 MED ORDER — AMIODARONE HCL 200 MG PO TABS
200.0000 mg | ORAL_TABLET | Freq: Two times a day (BID) | ORAL | Status: DC
  Administered 2022-10-17 – 2022-10-21 (×10): 200 mg via ORAL
  Filled 2022-10-16 (×10): qty 1

## 2022-10-16 MED ORDER — CLOPIDOGREL BISULFATE 75 MG PO TABS
75.0000 mg | ORAL_TABLET | Freq: Every day | ORAL | Status: DC
  Administered 2022-10-17 – 2022-10-21 (×5): 75 mg via ORAL
  Filled 2022-10-16 (×5): qty 1

## 2022-10-16 MED ORDER — ACETAMINOPHEN 325 MG PO TABS: 650.0000 mg | ORAL_TABLET | Freq: Four times a day (QID) | ORAL | Status: DC | PRN

## 2022-10-16 NOTE — Progress Notes (Signed)
Paramedicine Encounter    Patient ID: Albert Powell, male    DOB: 01/19/1941, 82 y.o.   MRN: [id number]   Complaints Nausea w/ dry heaves, dizziness when standing, weakness  Assessment + orthostatic changes, no chest pain, no edema, weakness  Compliance with meds non-compliant- missed more than he's taken  Pill box filled reconciled x 1 week  Refills needed none  Meds changes since last visit one   Social changes none   SpO2 99%  Weight yesterday-not taken Last visit weight-193lb  ATF Albert Powell lying in the bed which is where he stays most lately.  He says that last night he fell out of bed and spend the night in the floor.  He said his wife would not try to help him.  EMS came this morning to evaluate him and help him back into bed.  He refused transport at that time.  I overheard Mrs. Fahrner was screaming profanities at Albert Powell.  I've witnessed several of these instances but they seem to be getting more intense.   Albert Powell had positive orthostatic changes with and can only stand for about 1 minute before he says he gets dizzy and feels like he's going to fall.  He states he has no appetite and hasn't had a meal in a couple of days.  He has excessive thirst.  Normal bowel movement.  No blood noted in stool.   He initially denied transport with me and then stated he thinks he needs to go to the hospital for help "before something bad happens."   Discussed with EMS to relay a message to the hospital that he desires to go to an assisted living facility.  I spoke with Albert Powell daughter by phone and she agrees with same.   ACTION: Home visit completed  Skipper Cliche [phone] 10/16/22  Patient Care Team: Patient, No Pcp Per as PCP - General ([place]) Lorretta Harp, MD as PCP - Cardiology (Cardiology) Lorretta Harp, MD as Consulting Physician (Cardiology) Deloria Lair, NP as [facility] [facility]  (Geriatric Medicine)  Patient Active Problem List   Diagnosis Date Noted   Hyponatremia 10/03/2022   Constipation 10/03/2022   Proctitis 10/03/2022   HLD (hyperlipidemia) 10/03/2022   Stage 3b chronic kidney disease (CKD) ([place]) 10/03/2022   GIB (gastrointestinal bleeding) 10/03/2022   Nausea and vomiting 09/18/2022   (HFpEF) heart failure with preserved ejection fraction ([place]) 09/18/2022   AKI (acute kidney injury) ([place]) 09/06/2022   Chronic combined systolic and diastolic CHF (congestive heart failure) ([place]) 09/04/2022   Near syncope 09/04/2022   Hypokalemia 06/23/2022   Elevated troponin 06/23/2022   Type 2 diabetes mellitus with hyperlipidemia ([place]) 05/19/2022   Acute combined systolic and diastolic heart failure ([name])    Acute kidney injury superimposed on chronic kidney disease ([place]) 05/13/2022   GERD (gastroesophageal reflux disease) 05/13/2022   Acquired hypothyroidism 05/13/2022   Paroxysmal atrial fibrillation ([place]) 40/06/2724   Acute metabolic encephalopathy    CHF (congestive heart failure) ([place]) 03/10/2021   Essential hypertension 03/10/2021   Hypertensive crisis 01/01/2021   ACS (acute coronary syndrome) ([place])    Lesion of right native kidney    Atrial fibrillation ([place]) 03/30/2020   Contusion of right hand 11/12/2018   Primary osteoarthritis of both first carpometacarpal joints 11/12/2018   Pain in joint of right shoulder 10/05/2018   Pain in right hand 10/05/2018   Hematoma 03/30/2018   Degeneration of lumbar intervertebral disc 12/21/2017   Lumbar  radiculopathy 12/21/2017   Vertigo 04/09/2017   Hypogonadism in male 12/07/2014   Screening for prostate cancer 12/07/2014   Claudication of right lower extremity ([place]) 10/20/2013   Claudication in peripheral vascular disease- Rt leg 09/20/2013   Obesity (BMI 30-39.9)- negative sleep study in the past 09/20/2013   Occlusion and stenosis of carotid artery without mention of cerebral infarction 05/23/2013   PAD  (peripheral artery disease) ([place]) 05/18/2013   Carotid artery disease ([place]) 04/14/2013   Bradycardia, sinus 02/18/2013   Tobacco abuse 02/05/2013   Chest pain, unstable angina, negative MI 02/04/2013   Coronary artery disease involving native coronary artery of native heart without angina pectoris 02/04/2013   Benign essential hypertension 07/10/2011   Incisional hernia 05/22/2011    Current Outpatient Medications:    amiodarone (PACERONE) 200 MG tablet, Take 1 tablet (200 mg total) by mouth 2 (two) times daily., Disp: 60 tablet, Rfl: 3   amLODipine (NORVASC) 10 MG tablet, Take 1 tablet (10 mg total) by mouth daily., Disp: 30 tablet, Rfl: 11   apixaban (ELIQUIS) 2.5 MG TABS tablet, Take 1 tablet (2.5 mg total) by mouth 2 (two) times daily., Disp: 60 tablet, Rfl: 3   atorvastatin (LIPITOR) 40 MG tablet, Take 1 tablet (40 mg total) by mouth daily., Disp: 30 tablet, Rfl: 11   clopidogrel (PLAVIX) 75 MG tablet, Take 1 tablet (75 mg total) by mouth daily., Disp: 30 tablet, Rfl: 11   glipiZIDE (GLUCOTROL) 5 MG tablet, Take 5 mg by mouth daily before breakfast., Disp: , Rfl:    hydrALAZINE (APRESOLINE) 100 MG tablet, Take 1 tablet (100 mg total) by mouth 3 (three) times daily., Disp: 90 tablet, Rfl: 0   isosorbide mononitrate (IMDUR) 120 MG 24 hr tablet, Take 1 tablet (120 mg total) by mouth daily., Disp: 30 tablet, Rfl: 11   levothyroxine (SYNTHROID) 25 MCG tablet, TAKE ONE TABLET BY MOUTH DAILY BEFORE BREAKFAST (Patient taking differently: Take 75 mcg by mouth daily before breakfast.), Disp: 90 tablet, Rfl: 3   nystatin (MYCOSTATIN/NYSTOP) powder, Apply topically 2 (two) times daily., Disp: 15 g, Rfl: 0   pantoprazole (PROTONIX) 40 MG tablet, Take 1 tablet (40 mg total) by mouth every other day., Disp: 30 tablet, Rfl: 3   potassium chloride SA (KLOR-CON M) 20 MEQ tablet, Take 2 tablets (40 mEq total) by mouth 2 (two) times daily., Disp: 18 tablet, Rfl: 0   Multiple Vitamin (MULTIVITAMIN WITH  MINERALS) TABS tablet, Take 1 tablet by mouth daily. (Patient not taking: Reported on 10/01/2022), Disp: 30 tablet, Rfl: 0   ondansetron (ZOFRAN-ODT) 4 MG disintegrating tablet, Take 1 tablet (4 mg total) by mouth every 8 (eight) hours as needed for up to 15 doses for nausea or vomiting., Disp: 15 tablet, Rfl: 0 Allergies  Allergen Reactions   Fentanyl Other (See Comments)    Behavioral changes   Gabapentin Swelling   Lisinopril Other (See Comments)    Unknown reaction   Lyrica [Pregabalin] Other (See Comments)    Unknown reaction   Metformin Diarrhea   Propofol Other (See Comments)    Heart rate dropped     Social History   Socioeconomic History   Marital status: Married    Spouse name: Albert Powell   Number of children: 1   Years of education: Not on file   Highest education level: High school graduate  Occupational History   Occupation: retired  Tobacco Use   Smoking status: Former    Packs/day: 1.50    Years: 57.00  Total pack years: 85.50    Types: Cigarettes, E-cigarettes    Quit date: 2020    Years since quitting: 4.0   Smokeless tobacco: Never   Tobacco comments:    pt states that he is using the vapor cigs--12/2021  Vaping Use   Vaping Use: Former   Start date: 09/15/2016   Substances: Nicotine  Substance and Sexual Activity   Alcohol use: Yes    Alcohol/week: 1.0 standard drink of alcohol    Types: 1 Glasses of wine per week    Comment: "very little"   Drug use: No   Sexual activity: Not on file  Other Topics Concern   Not on file  Social History Narrative   Not on file   Social Determinants of Health   Financial Resource Strain: Low Risk  (06/03/2022)   Overall Financial Resource Strain (CARDIA)    Difficulty of Paying Living Expenses: Not hard at all  Food Insecurity: No Food Insecurity (10/03/2022)   Hunger Vital Sign    Worried About Running Out of Food in the Last Year: Never true    Ran Out of Food in the Last Year: Never true   Transportation Needs: Unmet Transportation Needs (10/08/2022)   PRAPARE - Hydrologist (Medical): Yes    Lack of Transportation (Non-Medical): No  Physical Activity: Not on file  Stress: Not on file  Social Connections: Not on file  Intimate Partner Violence: Not At Risk (10/03/2022)   Humiliation, Afraid, Rape, and Kick questionnaire    Fear of Current or Ex-Partner: No    Emotionally Abused: No    Physically Abused: No    Sexually Abused: No    Physical Exam      Future Appointments  Date Time Provider [place]  10/20/2022 10:30 AM [facility] PA/NP [facility] None

## 2022-10-16 NOTE — ED Notes (Signed)
Patient transported to X-ray 

## 2022-10-16 NOTE — ED Provider Notes (Signed)
[place] EMERGENCY DEPARTMENT AT [facility] Provider Note   CSN: [id number] Arrival date & time: 10/16/22  1725     History  Chief Complaint  Patient presents with   Weakness    Albert Powell is a 82 y.o. male.  Patient presents the emergency department via EMS with multiple complaints.  Patient states that for the past 3 days he has been feeling increasingly weak.  He states that yesterday he fell, injuring his right hip.  He states he fell due to weakness and due to the fact that his legs would not support him.  He denies hitting his head during this fall.  He does take Eliquis.  The patient also endorses nausea and vomiting over the past 2 to 3 days.  He states he is unable to keep any food or fluids down.  He states he is post to take potassium but is been unable to take them due to the nausea and vomiting.  He lives at home with his wife who has dementia.  He states that his wife is unable to assist him in any way at home due to the dementia.  He is requesting assistance being placed into a skilled nursing facility.  He denies chest pain, shortness of breath, abdominal pain, constipation, diarrhea, urinary symptoms, lower extremity swelling, headache.  Past medical history including coronary artery disease, COPD, carotid artery disease, atrial fibrillation on Eliquis, peripheral artery disease, hypothyroidism, GERD, degenerative disc disease, type II DM, hypertension, sinus bradycardia, congestive heart failure, chronic kidney disease stage IIIb  HPI     Home Medications Prior to Admission medications   Medication Sig Start Date End Date Taking? Authorizing Provider  amiodarone (PACERONE) 200 MG tablet Take 1 tablet (200 mg total) by mouth 2 (two) times daily. 08/05/22  Yes Laurey Morale, MD  amLODipine (NORVASC) 10 MG tablet Take 1 tablet (10 mg total) by mouth daily. 08/05/22 08/05/23 Yes Laurey Morale, MD  apixaban (ELIQUIS) 2.5 MG TABS tablet Take 1 tablet (2.5  mg total) by mouth 2 (two) times daily. 08/05/22  Yes Laurey Morale, MD  atorvastatin (LIPITOR) 40 MG tablet Take 1 tablet (40 mg total) by mouth daily. 08/15/22   Laurey Morale, MD  clopidogrel (PLAVIX) 75 MG tablet Take 1 tablet (75 mg total) by mouth daily. 08/15/22   Laurey Morale, MD  glipiZIDE (GLUCOTROL) 5 MG tablet Take 5 mg by mouth daily before breakfast.    [provider]  hydrALAZINE (APRESOLINE) 100 MG tablet Take 1 tablet (100 mg total) by mouth 3 (three) times daily. 09/25/22 10/25/22  Regalado, Jon Billings A, MD  isosorbide mononitrate (IMDUR) 120 MG 24 hr tablet Take 1 tablet (120 mg total) by mouth daily. 08/15/22   Laurey Morale, MD  levothyroxine (SYNTHROID) 25 MCG tablet TAKE ONE TABLET BY MOUTH DAILY BEFORE BREAKFAST Patient taking differently: Take 75 mcg by mouth daily before breakfast. 03/13/22   [name], MD  Multiple Vitamin (MULTIVITAMIN WITH MINERALS) TABS tablet Take 1 tablet by mouth daily. Patient not taking: Reported on 10/01/2022 09/25/22   Regalado, Jon Billings A, MD  nystatin (MYCOSTATIN/NYSTOP) powder Apply topically 2 (two) times daily. 09/25/22   Regalado, Belkys A, MD  ondansetron (ZOFRAN-ODT) 4 MG disintegrating tablet Take 1 tablet (4 mg total) by mouth every 8 (eight) hours as needed for up to 15 doses for nausea or vomiting. 08/28/22   Terald Sleeper, MD  pantoprazole (PROTONIX) 40 MG tablet Take 1 tablet (  40 mg total) by mouth every other day. 07/30/22   Lyda Jester M, PA-C  potassium chloride SA (KLOR-CON M) 20 MEQ tablet Take 2 tablets (40 mEq total) by mouth 2 (two) times daily. 10/06/22   Nita Sells, MD      Allergies    Fentanyl, Gabapentin, Lisinopril, Lyrica [pregabalin], Metformin, and Propofol    Review of Systems   Review of Systems  Constitutional:  Negative for fever.  Respiratory:  Negative for shortness of breath.   Cardiovascular:  Negative for chest pain.  Gastrointestinal:  Positive for nausea and  vomiting. Negative for abdominal pain, constipation and diarrhea.  Genitourinary:  Negative for dysuria.  Neurological:  Positive for weakness.    Physical Exam Updated Vital Signs BP (!) 189/88   Pulse 69   Temp (!) 97.5 F (36.4 C) (Oral)   Resp 10   Ht 5\' 9"  (1.753 m)   Wt 90.7 kg   SpO2 97%   BMI 29.53 kg/m  Physical Exam Vitals and nursing note reviewed.  Constitutional:      General: He is not in acute distress.    Appearance: He is well-developed.  HENT:     Head: Normocephalic and atraumatic.  Eyes:     Conjunctiva/sclera: Conjunctivae normal.  Cardiovascular:     Rate and Rhythm: Normal rate and regular rhythm.     Heart sounds: No murmur heard. Pulmonary:     Effort: Pulmonary effort is normal. No respiratory distress.     Breath sounds: Normal breath sounds.  Abdominal:     Palpations: Abdomen is soft.     Tenderness: There is no abdominal tenderness.  Musculoskeletal:        General: Tenderness (Lateral right hip) present. No swelling or deformity.     Cervical back: Neck supple.  Skin:    General: Skin is warm and dry.     Capillary Refill: Capillary refill takes less than 2 seconds.  Neurological:     General: No focal deficit present.     Mental Status: He is alert.  Psychiatric:        Mood and Affect: Mood normal.     ED Results / Procedures / Treatments   Labs (all labs ordered are listed, but only abnormal results are displayed) Labs Reviewed  URINALYSIS, ROUTINE W REFLEX MICROSCOPIC - Abnormal; Notable for the following components:      Result Value   Specific Gravity, Urine 1.004 (*)    Glucose, UA 50 (*)    Hgb urine dipstick SMALL (*)    Protein, ur >=300 (*)    Bacteria, UA RARE (*)    All other components within normal limits  CBC WITH DIFFERENTIAL/PLATELET - Abnormal; Notable for the following components:   RDW 16.3 (*)    Abs Immature Granulocytes 0.15 (*)    All other components within normal limits  COMPREHENSIVE METABOLIC  PANEL - Abnormal; Notable for the following components:   Sodium 132 (*)    Potassium 2.2 (*)    Glucose, Bld 102 (*)    Creatinine, Ser 1.66 (*)    Calcium 7.9 (*)    Total Protein 5.3 (*)    Albumin 2.4 (*)    GFR, Estimated 41 (*)    All other components within normal limits  CBG MONITORING, ED - Abnormal; Notable for the following components:   Glucose-Capillary 125 (*)    All other components within normal limits  TROPONIN I (HIGH SENSITIVITY) - Abnormal; Notable for the following components:  Troponin I (High Sensitivity) 32 (*)    All other components within normal limits  BRAIN NATRIURETIC PEPTIDE  BASIC METABOLIC PANEL  CBC  TROPONIN I (HIGH SENSITIVITY)    EKG None  Radiology CT Head Wo Contrast  Result Date: 10/16/2022 CLINICAL DATA:  Fall yesterday EXAM: CT HEAD WITHOUT CONTRAST TECHNIQUE: Contiguous axial images were obtained from the base of the skull through the vertex without intravenous contrast. RADIATION DOSE REDUCTION: This exam was performed according to the departmental dose-optimization program which includes automated exposure control, adjustment of the mA and/or kV according to patient size and/or use of iterative reconstruction technique. COMPARISON:  09/04/2022 FINDINGS: Brain: No evidence of acute infarction, hemorrhage, hydrocephalus, extra-axial collection or mass lesion/mass effect. Periventricular and deep white matter hypodensity. Vascular: No hyperdense vessel or unexpected calcification. Skull: Normal. Negative for fracture or focal lesion. Sinuses/Orbits: Partial opacification of the ethmoid air cells. Other: None. IMPRESSION: No acute intracranial pathology. Small-vessel white matter disease. Electronically Signed   By: Delanna Ahmadi M.D.   On: 10/16/2022 19:06   DG Hip Unilat With Pelvis 2-3 Views Right  Result Date: 10/16/2022 CLINICAL DATA:  Right hip pain post fall EXAM: DG HIP (WITH OR WITHOUT PELVIS) 3V RIGHT COMPARISON:  None Available.  FINDINGS: Patient rotation somewhat limits evaluation. No evidence of hip fracture or dislocation. Generator pack overlying the right iliac bone with lead extending superiorly. Mild degenerative changes of the bilateral hips. Moderate degenerative changes of the partially visualized lower lumbar spine. Vascular calcifications and vascular stents. IMPRESSION: No radiographic evidence of hip fracture or dislocation. Electronically Signed   By: Yetta Glassman M.D.   On: 10/16/2022 18:54   DG Chest 1 View  Result Date: 10/16/2022 CLINICAL DATA:  Weakness, fall EXAM: CHEST  1 VIEW COMPARISON:  09/17/2022 FINDINGS: The heart size and mediastinal contours are within normal limits. Both lungs are clear. The visualized skeletal structures are unremarkable. IMPRESSION: No acute abnormality of the lungs in AP portable projection. Electronically Signed   By: Delanna Ahmadi M.D.   On: 10/16/2022 18:53    Procedures Procedures    Medications Ordered in ED Medications  potassium chloride 10 mEq in 100 mL IVPB (10 mEq Intravenous New Bag/Given 10/16/22 2222)  amiodarone (PACERONE) tablet 200 mg (has no administration in time range)  amLODipine (NORVASC) tablet 10 mg (has no administration in time range)  atorvastatin (LIPITOR) tablet 40 mg (has no administration in time range)  hydrALAZINE (APRESOLINE) tablet 100 mg (has no administration in time range)  isosorbide mononitrate (IMDUR) 24 hr tablet 120 mg (has no administration in time range)  glipiZIDE (GLUCOTROL) tablet 5 mg (has no administration in time range)  levothyroxine (SYNTHROID) tablet 75 mcg (has no administration in time range)  pantoprazole (PROTONIX) EC tablet 40 mg (has no administration in time range)  apixaban (ELIQUIS) tablet 2.5 mg (has no administration in time range)  clopidogrel (PLAVIX) tablet 75 mg (has no administration in time range)  potassium chloride SA (KLOR-CON M) CR tablet 40 mEq (has no administration in time range)  nystatin  (MYCOSTATIN/NYSTOP) topical powder (has no administration in time range)  0.9 % NaCl with KCl 20 mEq/ L  infusion (has no administration in time range)  acetaminophen (TYLENOL) tablet 650 mg (has no administration in time range)    Or  acetaminophen (TYLENOL) suppository 650 mg (has no administration in time range)  traZODone (DESYREL) tablet 25 mg (has no administration in time range)  magnesium hydroxide (MILK OF MAGNESIA) suspension 30 mL (  has no administration in time range)  ondansetron (ZOFRAN) tablet 4 mg (has no administration in time range)    Or  ondansetron (ZOFRAN) injection 4 mg (has no administration in time range)  potassium chloride (KLOR-CON) packet 40 mEq (has no administration in time range)  sodium chloride 0.9 % bolus 1,000 mL (0 mLs Intravenous Stopped 10/16/22 2106)  ondansetron (ZOFRAN) injection 4 mg (4 mg Intravenous Given 10/16/22 1854)    ED Course/ Medical Decision Making/ A&P                             Medical Decision Making Amount and/or Complexity of Data Reviewed Labs: ordered. Radiology: ordered.  Risk Prescription drug management. Decision regarding hospitalization.   This patient presents to the ED for concern of weakness, nausea and vomiting, this involves an extensive number of treatment options, and is a complaint that carries with it a high risk of complications and morbidity.  The differential diagnosis includes ACS, viral illness, dysrhythmia, hypertensive urgency, GERD, others   Co morbidities that complicate the patient evaluation  GERD, coronary artery disease, atrial fibrillation, congestive heart failure   Additional history obtained:  Additional history obtained from EMS External records from outside source obtained and reviewed including notes from January 19.  Patient was admitted at that time due to a GI bleed. Discharged on January 22. Patient also admitted on 1/3, discharged 1/11 due to AKI. Patient admitted on 12/21,  discharged 12/24 also for acute on chronic kidney disease   Lab Tests:  I Ordered, and personally interpreted labs.  The pertinent results include: Potassium 2.2, sodium 132, creatinine 1.66, grossly unremarkable CBC, initial troponin 32 (troponins consistently elevated over the past month)   Imaging Studies ordered:  I ordered imaging studies including CT head, plain films of the right hip, chest x-ray I independently visualized and interpreted imaging which showed no acute abnormality on the head CT.  No fracture on the hip x-ray.  No acute abnormality on the chest x-ray. I agree with the radiologist interpretation   Cardiac Monitoring: / EKG:  The patient was maintained on a cardiac monitor.  I personally viewed and interpreted the cardiac monitored which showed an underlying rhythm of: Atrial fibrillation   Consultations Obtained:  I requested consultation with the hospitalist, Dr.Mansy and discussed lab and imaging findings as well as pertinent plan - they recommend: admission   Problem List / ED Course / Critical interventions / Medication management   I ordered medication including saline bolus for fluid resuscitation, Zofran for nausea, potassium for hypokalemia Reevaluation of the patient after these medicines showed that the patient improved I have reviewed the patients home medicines and have made adjustments as needed   Social Determinants of Health:  Patient lives at home with his wife who has dementia and is unable to care for him   Test / Admission - Considered:  Patient needs admission for potassium repletion and likely placement in a skilled nursing facility as his family is currently unable to care for him.        Final Clinical Impression(s) / ED Diagnoses Final diagnoses:  Weakness  Hypokalemia  Nausea and vomiting, unspecified vomiting type    Rx / DC Orders ED Discharge Orders     None         Ronny Bacon 10/16/22  2257    Cristie Hem, MD 10/17/22 1535

## 2022-10-16 NOTE — ED Notes (Signed)
Provider notified of critical lab result.  

## 2022-10-16 NOTE — ED Notes (Signed)
Daughter Daine Floras [phone] wants an update immediately

## 2022-10-16 NOTE — H&P (Addendum)
[place]   PATIENT NAME: Albert Powell    MR#:  [id number]  DATE OF BIRTH:  May 12, 1941  DATE OF ADMISSION:  10/16/2022  PRIMARY CARE PHYSICIAN: Patient, No Pcp Per   Patient is coming from: Home  REQUESTING/REFERRING PHYSICIAN: Ronny Bacon   CHIEF COMPLAINT:   Chief Complaint  Patient presents with   Weakness    HISTORY OF PRESENT ILLNESS:  Albert Powell is a 82 y.o. male with medical history significant for coronary artery disease, atrial fibrillation, carotid artery disease, CHF, stage III CKD, COPD, GERD, type 2 diabetes mellitus, hypertension and dyslipidemia, who presented to the ER with acute onset of generalized weakness for the last 3 days with associated recurrent nausea and vomiting.  At home his wife takes care of him and she apparently has recent diagnosis of dementia and cannot take care of him anymore per his report.  He denies any headache or dizziness or blurred vision.  He had mild diarrhea initially that subsided without melena or bright red bleeding per rectum.  Denies any bilious vomitus or hematemesis.  No dysuria, oliguria or hematuria or flank pain.  ED Course: When he came to the ER, BP was 175/105 with a temperature of 97.4 and otherwise normal vital signs.  Labs revealed hypokalemia of 2.2 and hyponatremia 132 with a creatinine of 1.66 and calcium 7.9, albumin 2.4 and total protein 5.3 with high sensitive troponin I 32.  CBC was unremarkable UA showed more than 300 protein and low specific gravity of 1004. EKG as reviewed by me : EKG showed atrial fibrillation with controlled ventricular sponsor of 79 with left axis deviation and left anterior fascicular block Q waves anteriorly and prolonged QT interval with QTc of 505 MS Imaging: Portable chest x-ray showed no acute cardiopulmonary disease.  Noncontrasted head CT scan revealed no acute intracranial abnormalities.  It showed small vessel white matter disease.  The patient was given 1 L  bolus of IV normal saline and 10 mill equivalent IV potassium chloride with 4 mg of IV Zofran.  He will be admitted to a medical telemetry bed for further evaluation and management. PAST MEDICAL HISTORY:   Past Medical History:  Diagnosis Date   Atrial fibrillation ([place]) 03/30/2020   Bradycardia, sinus 02/18/2013   CAD (coronary artery disease)    stent to RCA 2003 also had 40% lesion at that time; NUCLEAR STRESS TEST, 01/16/2010 - normal  now with cath 80-90% stenosis in LAD, will try medical therapy if no improvement PCI   Carotid artery disease ([name])    Cataract    CHF (congestive heart failure) ([place])    Chronic kidney disease (CKD), stage III (moderate) ([place])    Claudication ([place])    LEA DUPLEX, 07/07/2008 - Normal   COPD (chronic obstructive pulmonary disease) ([name])    DDD (degenerative disc disease) 2008   Diabetes mellitus    GERD (gastroesophageal reflux disease)    H/O hiatal hernia    History of colonoscopy 2004   finding of tics and AMV only no polpys   Hyperlipidemia 02/05/2013   Hypertension    Hypothyroidism    Obesity    Onychomycosis    PAD (peripheral artery disease) ([place])    Rosacea    Tobacco abuse 02/05/2013    PAST SURGICAL HISTORY:   Past Surgical History:  Procedure Laterality Date   ABDOMINAL AORTOGRAM W/LOWER EXTREMITY N/A 03/30/2018   Procedure: ABDOMINAL AORTOGRAM W/LOWER EXTREMITY;  Surgeon: Serafina Mitchell,  MD;  Location: [facility];  Service: Cardiovascular;  Laterality: N/A;   ABDOMINAL AORTOGRAM W/LOWER EXTREMITY Bilateral 08/27/2020   Procedure: ABDOMINAL AORTOGRAM W/LOWER EXTREMITY;  Surgeon: Lorretta Harp, MD;  Location: [facility];  Service: Cardiovascular;  Laterality: Bilateral;   APPENDECTOMY     CARDIAC CATHETERIZATION  08/29/2002   2-vessel CAD with high-grade stenoses in RCA; stenting to RCA   CARDIAC CATHETERIZATION  2014   80-90% lesion will try medical therapy   CARDIAC SURGERY     stents  2003    CARDIOVERSION N/A 11/01/2013   Procedure: [place] COMPRESSION;  Surgeon: Lorretta Harp, MD;  Location: [facility] CATH [facility];  Service: Cardiovascular;  Laterality: N/A;   CAROTID ANGIOGRAM N/A 04/25/2013   Procedure: CAROTID ANGIOGRAM;  Surgeon: Lorretta Harp, MD;  Location: [facility] CATH LAB;  Service: Cardiovascular;  Laterality: N/A;   CAROTID ENDARTERECTOMY     COLON RESECTION  3/04, 6/04    1 bleeding diverticulitis, 2 complete colectomy   COLON SURGERY  2005   renal pouch rectal anastimosis   CORONARY ANGIOPLASTY WITH STENT PLACEMENT  09/2002   2 stents to RCA   ENDARTERECTOMY Left 06/02/2013   Procedure: ENDARTERECTOMY CAROTID-LEFT;  Surgeon: Serafina Mitchell, MD;  Location: [facility];  Service: Vascular;  Laterality: Left;   INTRAVASCULAR PRESSURE WIRE/FFR STUDY N/A 04/02/2020   Procedure: INTRAVASCULAR PRESSURE WIRE/FFR STUDY;  Surgeon: Lorretta Harp, MD;  Location: [facility];  Service: Cardiovascular;  Laterality: N/A;  DFR - RCA   LEFT HEART CATH AND CORONARY ANGIOGRAPHY N/A 04/02/2020   Procedure: LEFT HEART CATH AND CORONARY ANGIOGRAPHY;  Surgeon: Lorretta Harp, MD;  Location: [facility];  Service: Cardiovascular;  Laterality: N/A;   LEFT HEART CATHETERIZATION WITH CORONARY ANGIOGRAM N/A 02/08/2013   Procedure: LEFT HEART CATHETERIZATION WITH CORONARY ANGIOGRAM;  Surgeon: Lorretta Harp, MD;  Location: [facility] CATH LAB;  Service: Cardiovascular;  Laterality: N/A;   LOWER EXTREMITY ANGIOGRAM N/A 10/20/2013   Procedure: LOWER EXTREMITY ANGIOGRAM;  Surgeon: Lorretta Harp, MD;  Location: [facility] CATH LAB;  Service: Cardiovascular;  Laterality: N/A;   PATCH ANGIOPLASTY Left 06/02/2013   Procedure: PATCH ANGIOPLASTY;  Surgeon: Serafina Mitchell, MD;  Location: [facility] OR;  Service: Vascular;  Laterality: Left;   PERIPHERAL VASCULAR INTERVENTION Bilateral 03/30/2018   Procedure: PERIPHERAL VASCULAR INTERVENTION;  Surgeon: Serafina Mitchell, MD;  Location: [facility];  Service:  Cardiovascular;  Laterality: Bilateral;  common iliacs   PRESSURE SENSOR/CARDIOMEMS N/A 07/02/2022   Procedure: PRESSURE SENSOR/CARDIOMEMS;  Surgeon: Larey Dresser, MD;  Location: [facility];  Service: Cardiovascular;  Laterality: N/A;   RIGHT HEART CATH N/A 07/02/2022   Procedure: RIGHT HEART CATH;  Surgeon: Larey Dresser, MD;  Location: [facility];  Service: Cardiovascular;  Laterality: N/A;   RIGHT/LEFT HEART CATH AND CORONARY ANGIOGRAPHY N/A 05/20/2022   Procedure: RIGHT/LEFT HEART CATH AND CORONARY ANGIOGRAPHY;  Surgeon: Early Osmond, MD;  Location: [facility];  Service: Cardiovascular;  Laterality: N/A;   TONSILLECTOMY      SOCIAL HISTORY:   Social History   Tobacco Use   Smoking status: Former    Packs/day: 1.50    Years: 57.00    Total pack years: 85.50    Types: Cigarettes, E-cigarettes    Quit date: 2020    Years since quitting: 4.0   Smokeless tobacco: Never   Tobacco comments:    pt states that he  is using the vapor cigs--12/2021  Substance Use Topics   Alcohol use: Yes    Alcohol/week: 1.0 standard drink of alcohol    Types: 1 Glasses of wine per week    Comment: "very little"    FAMILY HISTORY:   Family History  Problem Relation Age of Onset   Heart disease Father        heart attack at 92   Heart attack Father    Hyperlipidemia Father    Colonic polyp Mother        that bled out   COPD Mother    Diabetes Mother    Heart disease Sister    Colonic polyp Sister    Stroke Maternal Grandfather    Heart attack Paternal Grandfather    Other Neg Hx        hypogonadism    DRUG ALLERGIES:   Allergies  Allergen Reactions   Fentanyl Other (See Comments)    Behavioral changes   Gabapentin Swelling   Lisinopril Other (See Comments)    Unknown reaction   Lyrica [Pregabalin] Other (See Comments)    Unknown reaction   Metformin Diarrhea   Propofol Other (See Comments)    Heart rate dropped    REVIEW OF SYSTEMS:    ROS As per history of present illness. All pertinent systems were reviewed above. Constitutional, HEENT, cardiovascular, respiratory, GI, GU, musculoskeletal, neuro, psychiatric, endocrine, integumentary and hematologic systems were reviewed and are otherwise negative/unremarkable except for positive findings mentioned above in the HPI.   MEDICATIONS AT HOME:   Prior to Admission medications   Medication Sig Start Date End Date Taking? Authorizing Provider  amiodarone (PACERONE) 200 MG tablet Take 1 tablet (200 mg total) by mouth 2 (two) times daily. 08/05/22   Larey Dresser, MD  amLODipine (NORVASC) 10 MG tablet Take 1 tablet (10 mg total) by mouth daily. 08/05/22 08/05/23  Larey Dresser, MD  apixaban (ELIQUIS) 2.5 MG TABS tablet Take 1 tablet (2.5 mg total) by mouth 2 (two) times daily. 08/05/22   Larey Dresser, MD  atorvastatin (LIPITOR) 40 MG tablet Take 1 tablet (40 mg total) by mouth daily. 08/15/22   Larey Dresser, MD  clopidogrel (PLAVIX) 75 MG tablet Take 1 tablet (75 mg total) by mouth daily. 08/15/22   Larey Dresser, MD  glipiZIDE (GLUCOTROL) 5 MG tablet Take 5 mg by mouth daily before breakfast.    [provider]  hydrALAZINE (APRESOLINE) 100 MG tablet Take 1 tablet (100 mg total) by mouth 3 (three) times daily. 09/25/22 10/25/22  Regalado, Jerald Kief A, MD  isosorbide mononitrate (IMDUR) 120 MG 24 hr tablet Take 1 tablet (120 mg total) by mouth daily. 08/15/22   Larey Dresser, MD  levothyroxine (SYNTHROID) 25 MCG tablet TAKE ONE TABLET BY MOUTH DAILY BEFORE BREAKFAST Patient taking differently: Take 75 mcg by mouth daily before breakfast. 03/13/22   Lorretta Harp, MD  Multiple Vitamin (MULTIVITAMIN WITH MINERALS) TABS tablet Take 1 tablet by mouth daily. Patient not taking: Reported on 10/01/2022 09/25/22   Regalado, Jerald Kief A, MD  nystatin (MYCOSTATIN/NYSTOP) powder Apply topically 2 (two) times daily. 09/25/22   Regalado, Belkys A, MD  ondansetron  (ZOFRAN-ODT) 4 MG disintegrating tablet Take 1 tablet (4 mg total) by mouth every 8 (eight) hours as needed for up to 15 doses for nausea or vomiting. 08/28/22   Wyvonnia Dusky, MD  pantoprazole (PROTONIX) 40 MG tablet Take 1 tablet (40 mg total) by mouth every other day.  07/30/22   Robbie Lis M, PA-C  potassium chloride SA (KLOR-CON M) 20 MEQ tablet Take 2 tablets (40 mEq total) by mouth 2 (two) times daily. 10/06/22   Rhetta Mura, MD      VITAL SIGNS:  Blood pressure (!) 177/81, pulse 73, temperature 98.3 F (36.8 C), temperature source Oral, resp. rate 18, height 5\' 9"  (1.753 m), weight 90.7 kg, SpO2 100 %.  PHYSICAL EXAMINATION:  Physical Exam  GENERAL:  82 y.o.-year-old Caucasian male patient lying in the bed with no acute distress.  EYES: Pupils equal, round, reactive to light and accommodation. No scleral icterus. Extraocular muscles intact.  HEENT: Head atraumatic, normocephalic. Oropharynx with slightly dry mucous membrane and tongue and nasopharynx clear.  NECK:  Supple, no jugular venous distention. No thyroid enlargement, no tenderness.  LUNGS: Normal breath sounds bilaterally, no wheezing, rales,rhonchi or crepitation. No use of accessory muscles of respiration.  CARDIOVASCULAR: Regular rate and rhythm, S1, S2 normal. No murmurs, rubs, or gallops.  ABDOMEN: Soft, nondistended, nontender. Bowel sounds present. No organomegaly or mass.  EXTREMITIES: No pedal edema, cyanosis, or clubbing.  NEUROLOGIC: Cranial nerves II through XII are intact. Muscle strength 5/5 in all extremities. Sensation intact. Gait not checked.  PSYCHIATRIC: The patient is alert and oriented x 3.  Normal affect and good eye contact. SKIN: No obvious rash, lesion, or ulcer.   LABORATORY PANEL:   CBC Recent Labs  Lab 10/16/22 2114  WBC 8.3  HGB 14.6  HCT 41.4  PLT 294    ------------------------------------------------------------------------------------------------------------------  Chemistries  Recent Labs  Lab 10/16/22 2056  NA 132*  K 2.2*  CL 99  CO2 22  GLUCOSE 102*  BUN 11  CREATININE 1.66*  CALCIUM 7.9*  AST 22  ALT 24  ALKPHOS 87  BILITOT 0.8   ------------------------------------------------------------------------------------------------------------------  Cardiac Enzymes No results for input(s): "TROPONINI" in the last 168 hours. ------------------------------------------------------------------------------------------------------------------  RADIOLOGY:  CT Head Wo Contrast  Result Date: 10/16/2022 CLINICAL DATA:  Fall yesterday EXAM: CT HEAD WITHOUT CONTRAST TECHNIQUE: Contiguous axial images were obtained from the base of the skull through the vertex without intravenous contrast. RADIATION DOSE REDUCTION: This exam was performed according to the departmental dose-optimization program which includes automated exposure control, adjustment of the mA and/or kV according to patient size and/or use of iterative reconstruction technique. COMPARISON:  09/04/2022 FINDINGS: Brain: No evidence of acute infarction, hemorrhage, hydrocephalus, extra-axial collection or mass lesion/mass effect. Periventricular and deep white matter hypodensity. Vascular: No hyperdense vessel or unexpected calcification. Skull: Normal. Negative for fracture or focal lesion. Sinuses/Orbits: Partial opacification of the ethmoid air cells. Other: None. IMPRESSION: No acute intracranial pathology. Small-vessel white matter disease. Electronically Signed   By: 09/06/2022 M.D.   On: 10/16/2022 19:06   DG Hip Unilat With Pelvis 2-3 Views Right  Result Date: 10/16/2022 CLINICAL DATA:  Right hip pain post fall EXAM: DG HIP (WITH OR WITHOUT PELVIS) 3V RIGHT COMPARISON:  None Available. FINDINGS: Patient rotation somewhat limits evaluation. No evidence of hip fracture or  dislocation. Generator pack overlying the right iliac bone with lead extending superiorly. Mild degenerative changes of the bilateral hips. Moderate degenerative changes of the partially visualized lower lumbar spine. Vascular calcifications and vascular stents. IMPRESSION: No radiographic evidence of hip fracture or dislocation. Electronically Signed   By: 12/15/2022 M.D.   On: 10/16/2022 18:54   DG Chest 1 View  Result Date: 10/16/2022 CLINICAL DATA:  Weakness, fall EXAM: CHEST  1 VIEW COMPARISON:  09/17/2022 FINDINGS: The heart  size and mediastinal contours are within normal limits. Both lungs are clear. The visualized skeletal structures are unremarkable. IMPRESSION: No acute abnormality of the lungs in AP portable projection. Electronically Signed   By: Delanna Ahmadi M.D.   On: 10/16/2022 18:53      IMPRESSION AND PLAN:  Assessment and Plan: * Hypokalemia - The patient will be admitted to a medical telemetry bed. - Potassium will be aggressively replaced and magnesium level will be checked. - This could be the main culprit for his generalized weakness.  Generalized weakness - This associated with deconditioning. - PT consult will be obtained to assess further need for rehabilitation and a SNF. - Potassium will be replaced as mentioned above.  Acute kidney injury superimposed on chronic kidney disease ([place]) - This is superimposed on stage IIIa chronic kidney disease. - At this most likely prerenal due to dehydration from recurrent nausea and vomiting. - The patient will be hydrated with IV normal saline and will follow BMP. - We will avoid nephrotoxins.  Hyponatremia - This is clearly hypovolemic. - He will be hydrated with IV normal saline with added potassium chloride and will follow BMP.  Coronary artery disease involving native coronary artery of native heart without angina pectoris - We will continue Imdur, Plavix and statin therapy  GERD without esophagitis - We will  continue PPI therapy.  Hypothyroidism - We will continue Synthroid.  Chronic atrial fibrillation with RVR ([name]) - We will continue amiodarone and Eliquis.  Type 2 diabetes mellitus with chronic kidney disease, without long-term current use of insulin ([place]) - The patient will be on supplemental coverage with NovoLog. - We will continue glipizide.  Dyslipidemia - We will continue statin therapy.  Essential hypertension - We will continue antihypertensives while holding off nephrotoxins.   DVT prophylaxis: Eliquis. Advanced Care Planning:  Code Status: full code.  Family Communication:  The plan of care was discussed in details with the patient (and family). I answered all questions. The patient agreed to proceed with the above mentioned plan. Further management will depend upon hospital course. Disposition Plan: Back to previous home environment Consults called: none.  All the records are reviewed and case discussed with ED provider.  Status is: Inpatient    At the time of the admission, it appears that the appropriate admission status for this patient is inpatient.  This is judged to be reasonable and necessary in order to provide the required intensity of service to ensure the patient's safety given the presenting symptoms, physical exam findings and initial radiographic and laboratory data in the context of comorbid conditions.  The patient requires inpatient status due to high intensity of service, high risk of further deterioration and high frequency of surveillance required.  I certify that at the time of admission, it is my clinical judgment that the patient will require inpatient hospital care extending more than 2 midnights.                            Dispo: The patient is from: Home              Anticipated d/c is to: Home              Patient currently is not medically stable to d/c.              Difficult to place patient: No  Christel Mormon M.D on 10/17/2022 at 3:27  AM  [facility]  From 7 PM-7 AM, contact night-coverage [email or web address]  CC: Primary care physician; Patient, No Pcp Per

## 2022-10-16 NOTE — ED Triage Notes (Signed)
Patient arrived to the ED via EMS from home for generalized weakness and right hip pain. EMS states patient fell yesterday, did not hit head, patient is on eliquis. Patient has no complaints of head pain. Patient states he has not taken home meds for a week. Patient states wife has dementia and has been unable to help. Patient requests going to a skilled nursing facility after discharge. Patient complains of dizziness, N/V, and weakness for 2-3 days. Patient states he is unable to keep food or fluid down. Patient is supposed to take potassium pills and has not been taking them. Patient is A&Ox4.

## 2022-10-17 DIAGNOSIS — E039 Hypothyroidism, unspecified: Secondary | ICD-10-CM | POA: Insufficient documentation

## 2022-10-17 DIAGNOSIS — R531 Weakness: Secondary | ICD-10-CM

## 2022-10-17 DIAGNOSIS — E1122 Type 2 diabetes mellitus with diabetic chronic kidney disease: Secondary | ICD-10-CM | POA: Insufficient documentation

## 2022-10-17 DIAGNOSIS — I482 Chronic atrial fibrillation, unspecified: Secondary | ICD-10-CM | POA: Insufficient documentation

## 2022-10-17 DIAGNOSIS — K219 Gastro-esophageal reflux disease without esophagitis: Secondary | ICD-10-CM | POA: Insufficient documentation

## 2022-10-17 DIAGNOSIS — E785 Hyperlipidemia, unspecified: Secondary | ICD-10-CM | POA: Insufficient documentation

## 2022-10-17 DIAGNOSIS — E876 Hypokalemia: Secondary | ICD-10-CM | POA: Diagnosis not present

## 2022-10-17 LAB — BRAIN NATRIURETIC PEPTIDE: B Natriuretic Peptide: 572.2 pg/mL — ABNORMAL HIGH (ref 0.0–100.0)

## 2022-10-17 LAB — CBC
HCT: 38.4 % — ABNORMAL LOW (ref 39.0–52.0)
Hemoglobin: 13.7 g/dL (ref 13.0–17.0)
MCH: 30.2 pg (ref 26.0–34.0)
MCHC: 35.7 g/dL (ref 30.0–36.0)
MCV: 84.8 fL (ref 80.0–100.0)
Platelets: 262 10*3/uL (ref 150–400)
RBC: 4.53 MIL/uL (ref 4.22–5.81)
RDW: 16.2 % — ABNORMAL HIGH (ref 11.5–15.5)
WBC: 7.8 10*3/uL (ref 4.0–10.5)
nRBC: 0 % (ref 0.0–0.2)

## 2022-10-17 LAB — BASIC METABOLIC PANEL
Anion gap: 10 (ref 5–15)
BUN: 9 mg/dL (ref 8–23)
CO2: 21 mmol/L — ABNORMAL LOW (ref 22–32)
Calcium: 7.8 mg/dL — ABNORMAL LOW (ref 8.9–10.3)
Chloride: 100 mmol/L (ref 98–111)
Creatinine, Ser: 1.62 mg/dL — ABNORMAL HIGH (ref 0.61–1.24)
GFR, Estimated: 43 mL/min — ABNORMAL LOW (ref >60)
Glucose, Bld: 117 mg/dL — ABNORMAL HIGH (ref 70–99)
Potassium: 3.1 mmol/L — ABNORMAL LOW (ref 3.5–5.1)
Sodium: 131 mmol/L — ABNORMAL LOW (ref 135–145)

## 2022-10-17 LAB — MAGNESIUM: Magnesium: 1.8 mg/dL (ref 1.7–2.4)

## 2022-10-17 LAB — TROPONIN I (HIGH SENSITIVITY): Troponin I (High Sensitivity): 35 ng/L — ABNORMAL HIGH (ref <18)

## 2022-10-17 MED ORDER — POTASSIUM CHLORIDE CRYS ER 20 MEQ PO TBCR
40.0000 meq | EXTENDED_RELEASE_TABLET | ORAL | Status: AC
  Administered 2022-10-17 (×2): 40 meq via ORAL
  Filled 2022-10-17 (×2): qty 2

## 2022-10-17 NOTE — Assessment & Plan Note (Signed)
-   The patient will be admitted to a medical telemetry bed. - Potassium will be aggressively replaced and magnesium level will be checked. - This could be the main culprit for his generalized weakness.

## 2022-10-17 NOTE — Assessment & Plan Note (Signed)
-   This is clearly hypovolemic. - He will be hydrated with IV normal saline with added potassium chloride and will follow BMP.

## 2022-10-17 NOTE — Assessment & Plan Note (Signed)
-   The patient will be on supplemental coverage with NovoLog. - We will continue glipizide.

## 2022-10-17 NOTE — Assessment & Plan Note (Addendum)
-   We will continue PPI therapy. 

## 2022-10-17 NOTE — Progress Notes (Signed)
PT Cancellation Note  Patient Details Name: Albert Powell MRN: [id number] DOB: July 26, 1941   Cancelled Treatment:    Reason Eval/Treat Not Completed: Patient declined, no reason specified.  Pt is refusing therapy, does not feel up to moving.  Retry at another time.   Ramond Dial 10/17/2022, 11:50 AM  Mee Hives, PT PhD Acute Rehab Dept. Number: [place] and [place]

## 2022-10-17 NOTE — ED Notes (Signed)
ED TO INPATIENT HANDOFF REPORT  ED Nurse Name and Phone #: Albina Billet [phone]  M Name/Age/Gender Albert Powell 82 y.o. male Room/Bed: 026C/026C  Code Status   Code Status: Full Code  Home/SNF/Other Home Patient oriented to: self, place, time, and situation Is this baseline? Yes   Triage Complete: Triage complete  Chief Complaint Hypokalemia [E87.6]  Triage Note Patient arrived to the ED via EMS from home for generalized weakness and right hip pain. EMS states patient fell yesterday, did not hit head, patient is on eliquis. Patient has no complaints of head pain. Patient states he has not taken home meds for a week. Patient states wife has dementia and has been unable to help. Patient requests going to a skilled nursing facility after discharge. Patient complains of dizziness, N/V, and weakness for 2-3 days. Patient states he is unable to keep food or fluid down. Patient is supposed to take potassium pills and has not been taking them. Patient is A&Ox4.   Allergies Allergies  Allergen Reactions   Fentanyl Other (See Comments)    Behavioral changes   Gabapentin Swelling   Lisinopril Other (See Comments)    Unknown reaction   Lyrica [Pregabalin] Other (See Comments)    Unknown reaction   Metformin Diarrhea   Propofol Other (See Comments)    Heart rate dropped    Level of Care/Admitting Diagnosis ED Disposition   ED Disposition: Admit Condition: None Comment: Hospital Area: [facility] [[id number]]  Level of Care: Telemetry Medical [104]  May admit patient to [facility] or [facility] if equivalent level of care is available:: No  Covid Evaluation: Asymptomatic - no recent exposure (last 10 days) testing not required  Diagnosis: Hypokalemia [172180]  Admitting Physician: Christel Mormon [[id number]]  Attending Physician: Christel Mormon [[id number]]  Certification:: I certify this patient will need inpatient services for at least 2 midnights  Estimated Length of Stay:  2      B Medical/Surgery History Past Medical History:  Diagnosis Date   Atrial fibrillation ([place]) 03/30/2020   Bradycardia, sinus 02/18/2013   CAD (coronary artery disease)    stent to RCA 2003 also had 40% lesion at that time; NUCLEAR STRESS TEST, 01/16/2010 - normal  now with cath 80-90% stenosis in LAD, will try medical therapy if no improvement PCI   Carotid artery disease ([place])    Cataract    CHF (congestive heart failure) ([place])    Chronic kidney disease (CKD), stage III (moderate) ([place])    Claudication ([place])    LEA DUPLEX, 07/07/2008 - Normal   COPD (chronic obstructive pulmonary disease) ([place])    DDD (degenerative disc disease) 2008   Diabetes mellitus    GERD (gastroesophageal reflux disease)    H/O hiatal hernia    History of colonoscopy 2004   finding of tics and AMV only no polpys   Hyperlipidemia 02/05/2013   Hypertension    Hypothyroidism    Obesity    Onychomycosis    PAD (peripheral artery disease) ([place])    Rosacea    Tobacco abuse 02/05/2013   Past Surgical History:  Procedure Laterality Date   ABDOMINAL AORTOGRAM W/LOWER EXTREMITY N/A 03/30/2018   Procedure: ABDOMINAL AORTOGRAM W/LOWER EXTREMITY;  Surgeon: Serafina Mitchell, MD;  Location: [facility];  Service: Cardiovascular;  Laterality: N/A;   ABDOMINAL AORTOGRAM W/LOWER EXTREMITY Bilateral 08/27/2020   Procedure: ABDOMINAL AORTOGRAM W/LOWER EXTREMITY;  Surgeon: Lorretta Harp, MD;  Location: [facility];  Service: Cardiovascular;  Laterality: Bilateral;   APPENDECTOMY     CARDIAC CATHETERIZATION  08/29/2002   2-vessel CAD with high-grade stenoses in RCA; stenting to RCA   CARDIAC CATHETERIZATION  2014   80-90% lesion will try medical therapy   CARDIAC SURGERY     stents  2003   CARDIOVERSION N/A 11/01/2013   Procedure: [place] COMPRESSION;  Surgeon: Lorretta Harp, MD;  Location: [facility] CATH LAB;  Service: Cardiovascular;  Laterality: N/A;   CAROTID ANGIOGRAM N/A 04/25/2013    Procedure: CAROTID ANGIOGRAM;  Surgeon: Lorretta Harp, MD;  Location: [facility] CATH LAB;  Service: Cardiovascular;  Laterality: N/A;   CAROTID ENDARTERECTOMY     COLON RESECTION  3/04, 6/04    1 bleeding diverticulitis, 2 complete colectomy   COLON SURGERY  2005   renal pouch rectal anastimosis   CORONARY ANGIOPLASTY WITH STENT PLACEMENT  09/2002   2 stents to RCA   ENDARTERECTOMY Left 06/02/2013   Procedure: ENDARTERECTOMY CAROTID-LEFT;  Surgeon: Serafina Mitchell, MD;  Location: [facility];  Service: Vascular;  Laterality: Left;   INTRAVASCULAR PRESSURE WIRE/FFR STUDY N/A 04/02/2020   Procedure: INTRAVASCULAR PRESSURE WIRE/FFR STUDY;  Surgeon: Lorretta Harp, MD;  Location: [facility];  Service: Cardiovascular;  Laterality: N/A;  DFR - RCA   LEFT HEART CATH AND CORONARY ANGIOGRAPHY N/A 04/02/2020   Procedure: LEFT HEART CATH AND CORONARY ANGIOGRAPHY;  Surgeon: Lorretta Harp, MD;  Location: [facility];  Service: Cardiovascular;  Laterality: N/A;   LEFT HEART CATHETERIZATION WITH CORONARY ANGIOGRAM N/A 02/08/2013   Procedure: LEFT HEART CATHETERIZATION WITH CORONARY ANGIOGRAM;  Surgeon: Lorretta Harp, MD;  [facility] C[facility] LAB;  Service: Cardiovascular;  Laterality: N/A;   LOWER EXTREMITY ANGIOGRAM N/A 10/20/2013   Procedure: LOWER EXTREMITY ANGIOGRAM;  Surgeon: Lorretta Harp, MD;  Location: [facility] CATH LAB;  Service: Cardiovascular;  Laterality: N/A;   PATCH ANGIOPLASTY Left 06/02/2013   Procedure: PATCH ANGIOPLASTY;  Surgeon: Serafina Mitchell, MD;  Location[facility] OR;  Service: Vascular;  Laterality: Left;   PERIPHERAL VASCULAR INTERVENTION Bilateral 03/30/2018   Procedure: PERIPHERAL VASCULAR INTERVENTION;  Surgeon: Serafina Mitchell, MD;  Location: [facility];  Service: Cardiovascular;  Laterality: Bilateral;  common iliacs   PRESSURE SENSOR/CARDIOMEMS N/A 07/02/2022   Procedure: PRESSURE SENSOR/CARDIOMEMS;  Surgeon: Larey Dresser, MD;  Location: [place] [facility];  Service:  Cardiovascular;  Laterality: N/A;   RIGHT HEART CATH N/A 07/02/2022   Procedure: RIGHT HEART CATH;  Surgeon: Larey Dresser, MD;  Location: [facility] [facility];  Service: Cardiovascular;  Laterality: N/A;   RIGHT/LEFT HEART CATH AND CORONARY ANGIOGRAPHY N/A 05/20/2022   Procedure: RIGHT/LEFT HEART CATH AND CORONARY ANGIOGRAPHY;  Surgeon: Early Osmond, MD;  Location: [place] [facility];  Service: Cardiovascular;  Laterality: N/A;   TONSILLECTOMY       A IV Location/Drains/Wounds Patient Lines/Drains/Airways Status     Active Line/Drains/Airways     Name Placement date Placement time Site Days   Peripheral IV 20 G Left Antecubital no documentation  no documentation  Antecubital  no documentation            Intake/Output Last 24 hours  Intake/Output Summary (Last 24 hours) at 10/17/2022 0114 Last data filed at 10/17/2022 0051 Gross per 24 hour  Intake 1100 ml  Output 250 ml  Net 850 ml    Labs/Imaging Results for orders placed or performed during the hospital encounter of 10/16/22 (from the past 48 hour(s))  CBG  monitoring, ED     Status: Abnormal   Collection Time: 10/16/22  6:50 PM  Result Value Ref Range   Glucose-Capillary 125 (H) 70 - 99 mg/dL    Comment: Glucose reference range applies only to samples taken after fasting for at least 8 hours.  Urinalysis, Routine w reflex microscopic -Urine, Clean Catch     Status: Abnormal   Collection Time: 10/16/22  6:58 PM  Result Value Ref Range   Color, Urine YELLOW YELLOW   APPearance CLEAR CLEAR   Specific Gravity, Urine 1.004 (L) 1.005 - 1.030   pH 7.0 5.0 - 8.0   Glucose, UA 50 (A) NEGATIVE mg/dL   Hgb urine dipstick SMALL (A) NEGATIVE   Bilirubin Urine NEGATIVE NEGATIVE   Ketones, ur NEGATIVE NEGATIVE mg/dL   Protein, ur >=474 (A) NEGATIVE mg/dL   Nitrite NEGATIVE NEGATIVE   Leukocytes,Ua NEGATIVE NEGATIVE   RBC / HPF 0-5 0 - 5 RBC/hpf   WBC, UA 0-5 0 - 5 WBC/hpf   Bacteria, UA RARE (A) NONE SEEN   Squamous  Epithelial / HPF 0-5 0 - 5 /HPF    Comment: Performed at [facility], [address], [place], [place] [address]  Troponin I (High Sensitivity)     Status: Abnormal   Collection Time: 10/16/22  8:56 PM  Result Value Ref Range   Troponin I (High Sensitivity) 32 (H) <18 ng/L    Comment: (NOTE) Elevated high sensitivity troponin I (hsTnI) values and significant  changes across serial measurements may suggest ACS but many other  chronic and acute conditions are known to elevate hsTnI results.  Refer to the "Links" section for chest pain algorithms and additional  guidance. Performed at [facility], [address], [place], [place] [address]   Comprehensive metabolic panel     Status: Abnormal   Collection Time: 10/16/22  8:56 PM  Result Value Ref Range   Sodium 132 (L) 135 - 145 mmol/L   Potassium 2.2 (LL) 3.5 - 5.1 mmol/L    Comment: CRITICAL RESULT CALLED TO, READ BACK BY AND VERIFIED WITH ISAAC LANE RN 10/16/22 2[address]   Chloride 99 98 - 111 mmol/L   CO2 22 22 - 32 mmol/L   Glucose, Bld 102 (H) 70 - 99 mg/dL    Comment: Glucose reference range applies only to samples taken after fasting for at least 8 hours.   BUN 11 8 - 23 mg/dL   Creatinine, Ser 4.33 (H) 0.61 - 1.24 mg/dL   Calcium 7.9 (L) 8.9 - 10.3 mg/dL   Total Protein 5.3 (L) 6.5 - 8.1 g/dL   Albumin 2.4 (L) 3.5 - 5.0 g/dL   AST 22 15 - 41 U/L   ALT 24 0 - 44 U/L   Alkaline Phosphatase 87 38 - 126 U/L   Total Bilirubin 0.8 0.3 - 1.2 mg/dL   GFR, Estimated 41 (L) >60 mL/min    Comment: (NOTE) Calculated using the CKD-EPI Creatinine Equation (2021)    Anion gap 11 5 - 15    Comment: Performed at [facility], [address], [place], [place] [address]  CBC with Differential     Status: Abnormal   Collection Time: 10/16/22  9:14 PM  Result Value Ref Range   WBC 8.3 4.0 - 10.5 K/uL   RBC 4.83 4.22 - 5.81 MIL/uL   Hemoglobin 14.6 13.0 - 17.0 g/dL   HCT 84.1 66.0 - 63.0 %   MCV 85.7 80.0 -  100.0 fL   MCH 30.2 26.0 - 34.0 pg   MCHC 35.3 30.0 - 36.0 g/dL   RDW 16.3 (H) 11.5 - 15.5 %   Platelets 294 150 - 400 K/uL   nRBC 0.0 0.0 - 0.2 %   Neutrophils Relative % 76 %   Neutro Abs 6.4 1.7 - 7.7 K/uL   Lymphocytes Relative 12 %   Lymphs Abs 1.0 0.7 - 4.0 K/uL   Monocytes Relative 8 %   Monocytes Absolute 0.7 0.1 - 1.0 K/uL   Eosinophils Relative 1 %   Eosinophils Absolute 0.0 0.0 - 0.5 K/uL   Basophils Relative 1 %   Basophils Absolute 0.0 0.0 - 0.1 K/uL   Immature Granulocytes 2 %   Abs Immature Granulocytes 0.15 (H) 0.00 - 0.07 K/uL    Comment: Performed at [facility] [address], [place], [place] [address]   CT Head Wo Contrast  Result Date: 10/16/2022 CLINICAL DATA:  Fall yesterday EXAM: CT HEAD WITHOUT CONTRAST TECHNIQUE: Contiguous axial images were obtained from the base of the skull through the vertex without intravenous contrast. RADIATION DOSE REDUCTION: This exam was performed according to the departmental dose-optimization program which includes automated exposure control, adjustment of the mA and/or kV according to patient size and/or use of iterative reconstruction technique. COMPARISON:  09/04/2022 FINDINGS: Brain: No evidence of acute infarction, hemorrhage, hydrocephalus, extra-axial collection or mass lesion/mass effect. Periventricular and deep white matter hypodensity. Vascular: No hyperdense vessel or unexpected calcification. Skull: Normal. Negative for fracture or focal lesion. Sinuses/Orbits: Partial opacification of the ethmoid air cells. Other: None. IMPRESSION: No acute intracranial pathology. Small-vessel white matter disease. Electronically Signed   By: Delanna Ahmadi M.D.   On: 10/16/2022 19:06   DG Hip Unilat With Pelvis 2-3 Views Right  Result Date: 10/16/2022 CLINICAL DATA:  Right hip pain post fall EXAM: DG HIP (WITH OR WITHOUT PELVIS) 3V RIGHT COMPARISON:  None Available. FINDINGS: Patient rotation somewhat limits evaluation. No evidence  of hip fracture or dislocation. Generator pack overlying the right iliac bone with lead extending superiorly. Mild degenerative changes of the bilateral hips. Moderate degenerative changes of the partially visualized lower lumbar spine. Vascular calcifications and vascular stents. IMPRESSION: No radiographic evidence of hip fracture or dislocation. Electronically Signed   By: Yetta Glassman M.D.   On: 10/16/2022 18:54   DG Chest 1 View  Result Date: 10/16/2022 CLINICAL DATA:  Weakness, fall EXAM: CHEST  1 VIEW COMPARISON:  09/17/2022 FINDINGS: The heart size and mediastinal contours are within normal limits. Both lungs are clear. The visualized skeletal structures are unremarkable. IMPRESSION: No acute abnormality of the lungs in AP portable projection. Electronically Signed   By: Delanna Ahmadi M.D.   On: 10/16/2022 18:53    Pending Labs Unresulted Labs (From admission, onward)     Start     Ordered   10/17/22 [id number]  Basic metabolic panel  Tomorrow morning,   R        10/16/22 2245   10/17/22 0500  CBC  Tomorrow morning,   R        10/16/22 2245   10/16/22 1803  Brain natriuretic peptide  Once,   URGENT        10/16/22 1802            Vitals/Pain Today's Vitals   10/16/22 1800 10/16/22 2140 10/16/22 2225 10/17/22 0112  BP: (Abnormal) 153/101  (Abnormal) 189/88 (Abnormal) 184/87  Pulse: 99  69 69  Resp: (Abnormal) 21  10 14  Temp:  (Abnormal) 97.5 F (36.4 C)    TempSrc:  Oral    SpO2: 100%  97% 98%  Weight:      Height:      PainSc:        Isolation Precautions No active isolations  Medications Medications  potassium chloride 10 mEq in 100 mL IVPB (10 mEq Intravenous New Bag/Given 10/17/22 0052)  amiodarone (PACERONE) tablet 200 mg (200 mg Oral Given 10/17/22 0107)  amLODipine (NORVASC) tablet 10 mg (has no administration in time range)  atorvastatin (LIPITOR) tablet 40 mg (has no administration in time range)  hydrALAZINE (APRESOLINE) tablet 100 mg (has no administration  in time range)  isosorbide mononitrate (IMDUR) 24 hr tablet 120 mg (has no administration in time range)  glipiZIDE (GLUCOTROL) tablet 5 mg (has no administration in time range)  levothyroxine (SYNTHROID) tablet 75 mcg (has no administration in time range)  pantoprazole (PROTONIX) EC tablet 40 mg (has no administration in time range)  apixaban (ELIQUIS) tablet 2.5 mg (has no administration in time range)  clopidogrel (PLAVIX) tablet 75 mg (has no administration in time range)  potassium chloride SA (KLOR-CON M) CR tablet 40 mEq (has no administration in time range)  nystatin (MYCOSTATIN/NYSTOP) topical powder ( Topical Not Given 10/17/22 0112)  0.9 % NaCl with KCl 20 mEq/ L  infusion (has no administration in time range)  acetaminophen (TYLENOL) tablet 650 mg (has no administration in time range)    Or  acetaminophen (TYLENOL) suppository 650 mg (has no administration in time range)  traZODone (DESYREL) tablet 25 mg (has no administration in time range)  magnesium hydroxide (MILK OF MAGNESIA) suspension 30 mL (has no administration in time range)  ondansetron (ZOFRAN) tablet 4 mg (has no administration in time range)    Or  ondansetron (ZOFRAN) injection 4 mg (has no administration in time range)  sodium chloride 0.9 % bolus 1,000 mL (0 mLs Intravenous Stopped 10/16/22 2106)  ondansetron (ZOFRAN) injection 4 mg (4 mg Intravenous Given 10/16/22 1854)  potassium chloride (KLOR-CON) packet 40 mEq (40 mEq Oral Given 10/17/22 0106)    Mobility walks with device     Focused Assessments Cardiac Assessment Handoff:  Cardiac Rhythm: Normal sinus rhythm Lab Results  Component Value Date   TROPONINI <0.30 02/05/2013   Lab Results  Component Value Date   DDIMER 0.86 (H) 06/24/2022   Does the Patient currently have chest pain? No    R Recommendations: See Admitting Provider Note  Report given to:   Additional Notes: IV team consulted. IV he has now will not work.

## 2022-10-17 NOTE — Assessment & Plan Note (Signed)
-   We will continue antihypertensives while holding off nephrotoxins.

## 2022-10-17 NOTE — Assessment & Plan Note (Addendum)
-   We will continue amiodarone and Eliquis. 

## 2022-10-17 NOTE — Assessment & Plan Note (Signed)
-   This associated with deconditioning. - PT consult will be obtained to assess further need for rehabilitation and a SNF. - Potassium will be replaced as mentioned above.

## 2022-10-17 NOTE — Assessment & Plan Note (Signed)
-   We will continue Synthroid. 

## 2022-10-17 NOTE — Progress Notes (Signed)
PROGRESS NOTE  Albert Powell PPI:[id number] DOB: Mar 08, 1941 DOA: 10/16/2022 PCP: Patient, No Pcp Per   LOS: 1 day   Brief Narrative / Interim history: This is an 82 year old male with history of chronic systolic and diastolic CHF, CAD, PAF, CKD 3b, comes into the hospital with progressive generalized weakness especially for the last 3 days, associated with poor p.o. intake.  He reports occasional nausea, but no vomiting, no diarrhea.  His wife has been caring for him, and apparently has been diagnosed with dementia recently.  Denies any chest pain, denies any shortness of breath.  Subjective / 24h Interval events: -Continues to complain of significant weakness, mainly in his legs.  No chest pain, no shortness of breath  Assesement and Plan: Principal Problem:   Hypokalemia Active Problems:   Generalized weakness   Acute kidney injury superimposed on chronic kidney disease ([name])   Hyponatremia   Coronary artery disease involving native coronary artery of native heart without angina pectoris   Dyslipidemia   Type 2 diabetes mellitus with chronic kidney disease, without long-term current use of insulin ([name])   Chronic atrial fibrillation with RVR ([name])   Hypothyroidism   GERD without esophagitis   Principal problem Profound hypokalemia -patient was admitted to the hospital with a potassium of 2.2 and significant generalized weakness.  Replete aggressively potassium, improved to 3.1 this morning but still low.  Continue potassium supplementation.  Check magnesium level -Patient denies any nausea, vomiting, diarrhea but does state that he has poor p.o. intake.  He had a hospitalization earlier last month for nausea, vomiting, diarrhea but it has resolved.  Active problems CKD 3b -baseline creatinine varies between 1.2-1.48 in the past few months but as high as 1.8, 1.6 on admission and stable.  Continue to monitor, avoid nephrotoxins  Hyponatremia -with normal chloride level, was  dehydrated initially but has received fluids.  Currently appears euvolemic, discontinue fluids, will be mindful of his CHF history  Hypothyroidism-continue home Synthroid  Chronic combined CHF - Echo in August 2023 showed an EF of 25 to 30%, and repeat echo in December 2023 showed recovered EF of 55-60%.  Currently appears relatively euvolemic.  Patient was on chronic, but this was discontinued during his hospitalization in December when he developed sinus bradycardia with first-degree AV block.  Appears that over the last several months his spironolactone was held sometimes in December, and torsemide about a month ago when he was admitted with nausea, vomiting, diarrhea, acute kidney injury.  Closely monitor volume status  PAF -continue amiodarone, Eliquis  CAD - no chest pain.  Has mild troponin elevation, which is flat, not in a pattern consistent with ACS suggesting demand ischemia.  Continue statin, Imdur.  History of nausea, vomiting, diarrhea - this was recent, about a month ago.  He was admitted with acute kidney injury having to receive IV fluids.  His chronic torsemide was discontinued upon discharge.  Essential hypertension-continue amiodarone, hydralazine, Imdur.  Hyperlipidemia - continue statin  Type 2 diabetes mellitus -A1c acceptable in his age group.  Continue glipizide  Lab Results  Component Value Date   HGBA1C 7.2 (H) 05/14/2022   CBG (last 3)  Recent Labs    10/16/22 1850  GLUCAP 125*      Scheduled Meds:  amiodarone  200 mg Oral BID   amLODipine  10 mg Oral Daily   apixaban  2.5 mg Oral BID   atorvastatin  40 mg Oral Daily   clopidogrel  75 mg Oral Daily  glipiZIDE  5 mg Oral QAC breakfast   hydrALAZINE  100 mg Oral TID   isosorbide mononitrate  120 mg Oral Daily   levothyroxine  75 mcg Oral QAC breakfast   nystatin   Topical BID   pantoprazole  40 mg Oral QODAY   potassium chloride  40 mEq Oral Q3H   Continuous Infusions: PRN Meds:.acetaminophen  **OR** acetaminophen, magnesium hydroxide, ondansetron **OR** ondansetron (ZOFRAN) IV, traZODone  Current Outpatient Medications  Medication Instructions   amiodarone (PACERONE) 200 mg, Oral, 2 times daily   amLODipine (NORVASC) 10 mg, Oral, Daily   apixaban (ELIQUIS) 2.5 mg, Oral, 2 times daily   atorvastatin (LIPITOR) 40 mg, Oral, Daily   clopidogrel (PLAVIX) 75 mg, Oral, Daily   glipiZIDE (GLUCOTROL) 5 mg, Oral, Daily before breakfast   hydrALAZINE (APRESOLINE) 100 mg, Oral, 3 times daily   isosorbide mononitrate (IMDUR) 120 mg, Oral, Daily   levothyroxine (SYNTHROID) 25 MCG tablet TAKE ONE TABLET BY MOUTH DAILY BEFORE BREAKFAST   Multiple Vitamin (MULTIVITAMIN WITH MINERALS) TABS tablet 1 tablet, Oral, Daily   nystatin (MYCOSTATIN/NYSTOP) powder Topical, 2 times daily   ondansetron (ZOFRAN-ODT) 4 mg, Oral, Every 8 hours PRN   pantoprazole (PROTONIX) 40 mg, Oral, Every other day   potassium chloride SA (KLOR-CON M) 20 MEQ tablet 40 mEq, Oral, 2 times daily    Diet Orders (From admission, onward)     Start     Ordered   10/16/22 2235  Diet clear liquid Room service appropriate? Yes; Fluid consistency: Thin  Diet effective now       Question Answer Comment  Room service appropriate? Yes   Fluid consistency: Thin      10/16/22 2245            DVT prophylaxis: apixaban (ELIQUIS) tablet 2.5 mg Start: 10/16/22 2300 apixaban (ELIQUIS) tablet 2.5 mg   Lab Results  Component Value Date   PLT 262 10/17/2022      Code Status: Full Code  Family Communication: no family at bedside   Status is: Inpatient  Remains inpatient appropriate because: severity of illness  Level of care: Telemetry Medical  Consultants:  none  Objective: Vitals:   10/17/22 0232 10/17/22 0317 10/17/22 0821 10/17/22 0931  BP: 102/72 (!) 177/81 (!) 175/87 (!) 161/87  Pulse: 73 73 65 70  Resp: 16 18  17   Temp: (!) 97.5 F (36.4 C) 98.3 F (36.8 C)  98.6 F (37 C)  TempSrc:  Oral  Oral   SpO2: 99% 100%  97%  Weight:  83.2 kg    Height:  5\' 9"  (1.753 m)      Intake/Output Summary (Last 24 hours) at 10/17/2022 7564 Last data filed at 10/17/2022 0800 Gross per 24 hour  Intake 1500 ml  Output 750 ml  Net 750 ml   Wt Readings from Last 3 Encounters:  10/17/22 83.2 kg  10/09/22 87.6 kg  10/07/22 87.7 kg    Examination:  Constitutional: NAD Eyes: no scleral icterus ENMT: Mucous membranes are moist.  Neck: normal, supple Respiratory: clear to auscultation bilaterally, no wheezing, no crackles. Normal respiratory effort. No accessory muscle use.  Cardiovascular: Regular rate and rhythm, no murmurs / rubs / gallops. No LE edema.  Abdomen: non distended, no tenderness. Bowel sounds positive.  Musculoskeletal: no clubbing / cyanosis.   Data Reviewed: I have independently reviewed following labs and imaging studies   CBC Recent Labs  Lab 10/16/22 2114 10/17/22 0736  WBC 8.3 7.8  HGB 14.6 13.7  HCT 41.4 38.4*  PLT 294 262  MCV 85.7 84.8  MCH 30.2 30.2  MCHC 35.3 35.7  RDW 16.3* 16.2*  LYMPHSABS 1.0  --   MONOABS 0.7  --   EOSABS 0.0  --   BASOSABS 0.0  --     Recent Labs  Lab 10/16/22 2056 10/17/22 0736  NA 132* 131*  K 2.2* 3.1*  CL 99 100  CO2 22 21*  GLUCOSE 102* 117*  BUN 11 9  CREATININE 1.66* 1.62*  CALCIUM 7.9* 7.8*  AST 22  --   ALT 24  --   ALKPHOS 87  --   BILITOT 0.8  --   ALBUMIN 2.4*  --   BNP  --  572.2*    ------------------------------------------------------------------------------------------------------------------ No results for input(s): "CHOL", "HDL", "LDLCALC", "TRIG", "CHOLHDL", "LDLDIRECT" in the last 72 hours.  Lab Results  Component Value Date   HGBA1C 7.2 (H) 05/14/2022   ------------------------------------------------------------------------------------------------------------------ No results for input(s): "TSH", "T4TOTAL", "T3FREE", "THYROIDAB" in the last 72 hours.  Invalid input(s): "FREET3"  Cardiac  Enzymes No results for input(s): "CKMB", "TROPONINI", "MYOGLOBIN" in the last 168 hours.  Invalid input(s): "CK" ------------------------------------------------------------------------------------------------------------------    Component Value Date/Time   BNP 572.2 (H) 10/17/2022 0736    CBG: Recent Labs  Lab 10/16/22 1850  GLUCAP 125*    No results found for this or any previous visit (from the past 240 hour(s)).   Radiology Studies: CT Head Wo Contrast  Result Date: 10/16/2022 CLINICAL DATA:  Fall yesterday EXAM: CT HEAD WITHOUT CONTRAST TECHNIQUE: Contiguous axial images were obtained from the base of the skull through the vertex without intravenous contrast. RADIATION DOSE REDUCTION: This exam was performed according to the departmental dose-optimization program which includes automated exposure control, adjustment of the mA and/or kV according to patient size and/or use of iterative reconstruction technique. COMPARISON:  09/04/2022 FINDINGS: Brain: No evidence of acute infarction, hemorrhage, hydrocephalus, extra-axial collection or mass lesion/mass effect. Periventricular and deep white matter hypodensity. Vascular: No hyperdense vessel or unexpected calcification. Skull: Normal. Negative for fracture or focal lesion. Sinuses/Orbits: Partial opacification of the ethmoid air cells. Other: None. IMPRESSION: No acute intracranial pathology. Small-vessel white matter disease. Electronically Signed   By: Delanna Ahmadi M.D.   On: 10/16/2022 19:06   DG Hip Unilat With Pelvis 2-3 Views Right  Result Date: 10/16/2022 CLINICAL DATA:  Right hip pain post fall EXAM: DG HIP (WITH OR WITHOUT PELVIS) 3V RIGHT COMPARISON:  None Available. FINDINGS: Patient rotation somewhat limits evaluation. No evidence of hip fracture or dislocation. Generator pack overlying the right iliac bone with lead extending superiorly. Mild degenerative changes of the bilateral hips. Moderate degenerative changes of the  partially visualized lower lumbar spine. Vascular calcifications and vascular stents. IMPRESSION: No radiographic evidence of hip fracture or dislocation. Electronically Signed   By: Yetta Glassman M.D.   On: 10/16/2022 18:54   DG Chest 1 View  Result Date: 10/16/2022 CLINICAL DATA:  Weakness, fall EXAM: CHEST  1 VIEW COMPARISON:  09/17/2022 FINDINGS: The heart size and mediastinal contours are within normal limits. Both lungs are clear. The visualized skeletal structures are unremarkable. IMPRESSION: No acute abnormality of the lungs in AP portable projection. Electronically Signed   By: Delanna Ahmadi M.D.   On: 10/16/2022 18:53     Marzetta Board, MD, PhD [facility]  Between 7 am - 7 pm I am available, please contact me via Amion (for emergencies) or Securechat (non urgent messages)  Between 7 pm - 7  am I am not available, please contact night coverage MD/APP via Amion

## 2022-10-17 NOTE — Assessment & Plan Note (Signed)
-   We will continue statin therapy. 

## 2022-10-17 NOTE — Assessment & Plan Note (Signed)
-   We will continue Imdur, Plavix and statin therapy

## 2022-10-17 NOTE — Assessment & Plan Note (Addendum)
-   This is superimposed on stage IIIa chronic kidney disease. - At this most likely prerenal due to dehydration from recurrent nausea and vomiting. - The patient will be hydrated with IV normal saline and will follow BMP. - We will avoid nephrotoxins.

## 2022-10-18 DIAGNOSIS — E876 Hypokalemia: Secondary | ICD-10-CM | POA: Diagnosis not present

## 2022-10-18 LAB — COMPREHENSIVE METABOLIC PANEL
ALT: 21 U/L (ref 0–44)
AST: 19 U/L (ref 15–41)
Albumin: 2.3 g/dL — ABNORMAL LOW (ref 3.5–5.0)
Alkaline Phosphatase: 76 U/L (ref 38–126)
Anion gap: 7 (ref 5–15)
BUN: 10 mg/dL (ref 8–23)
CO2: 22 mmol/L (ref 22–32)
Calcium: 8.1 mg/dL — ABNORMAL LOW (ref 8.9–10.3)
Chloride: 104 mmol/L (ref 98–111)
Creatinine, Ser: 1.82 mg/dL — ABNORMAL HIGH (ref 0.61–1.24)
GFR, Estimated: 37 mL/min — ABNORMAL LOW (ref >60)
Glucose, Bld: 101 mg/dL — ABNORMAL HIGH (ref 70–99)
Potassium: 3.8 mmol/L (ref 3.5–5.1)
Sodium: 133 mmol/L — ABNORMAL LOW (ref 135–145)
Total Bilirubin: 0.7 mg/dL (ref 0.3–1.2)
Total Protein: 4.8 g/dL — ABNORMAL LOW (ref 6.5–8.1)

## 2022-10-18 LAB — CBC
HCT: 35.9 % — ABNORMAL LOW (ref 39.0–52.0)
Hemoglobin: 12.7 g/dL — ABNORMAL LOW (ref 13.0–17.0)
MCH: 30.5 pg (ref 26.0–34.0)
MCHC: 35.4 g/dL (ref 30.0–36.0)
MCV: 86.3 fL (ref 80.0–100.0)
Platelets: 247 10*3/uL (ref 150–400)
RBC: 4.16 MIL/uL — ABNORMAL LOW (ref 4.22–5.81)
RDW: 16.4 % — ABNORMAL HIGH (ref 11.5–15.5)
WBC: 6.9 10*3/uL (ref 4.0–10.5)
nRBC: 0 % (ref 0.0–0.2)

## 2022-10-18 LAB — MAGNESIUM: Magnesium: 1.6 mg/dL — ABNORMAL LOW (ref 1.7–2.4)

## 2022-10-18 MED ORDER — MAGNESIUM SULFATE 2 GM/50ML IV SOLN
2.0000 g | Freq: Once | INTRAVENOUS | Status: AC
  Administered 2022-10-18: 2 g via INTRAVENOUS
  Filled 2022-10-18: qty 50

## 2022-10-18 NOTE — Progress Notes (Signed)
Patients wife called nursing floor and said "she knew her husband had two whores in the room with him." I advised her that that was no true. She stated was coming by the hospital to prove it. Shortly after she showed up and was in the patients room. The patient was calm and stated he did not wish for her to be there.Security notified. She yelled at the patient as she left that she was not coming back again. Security walked patient out.

## 2022-10-18 NOTE — Evaluation (Signed)
Physical Therapy Evaluation Patient Details Name: Albert Powell MRN: [id number] DOB: 1941/08/21 Today's Date: 10/18/2022  History of Present Illness  82 year old male admitted 2/1 with hypokalemia, generalized weakness, and poor p.o. intake. PMHx of CHF, CAD, PAF, CKD 3b.  Clinical Impression  Pt admitted with above diagnosis. Familiar to our service from recent admission where he was d/c home with HHPT recommendations. Now returns with demonstrated reduction in ability to mobilize safely. Required min assist for bed mobility, and transfers. Able to take several pivot steps to [facility]. LEs fatigue quickly and pt anxious that LEs with buckle. States several falls in the past due to spontaneous buckling. Agreeable to SNF. Educated on acute PT role and need for participation in order to maximize functional outcomes. Unsafe living situation at home due to his wife's dementia and his lack of ability to care for himself completely.  Pt currently with functional limitations due to the deficits listed below (see PT Problem List). Pt will benefit from skilled PT to increase their independence and safety with mobility to allow discharge to the venue listed below.          Recommendations for follow up therapy are one component of a multi-disciplinary discharge planning process, led by the attending physician.  Recommendations may be updated based on patient status, additional functional criteria and insurance authorization.  Follow Up Recommendations Skilled nursing-short term rehab (<3 hours/day) (If pt does not qualify would maximize [facility] services - does not sound like a safe living situation with wife who has dementia.) Can patient physically be transported by private vehicle: Yes    Assistance Recommended at Discharge Intermittent Supervision/Assistance  Patient can return home with the following  Assistance with cooking/housework;Assist for transportation;Help with stairs or ramp for entrance;A little help  with walking and/or transfers;A little help with bathing/dressing/bathroom    Equipment Recommendations None recommended by PT  Recommendations for Other Services       Functional Status Assessment Patient has had a recent decline in their functional status and demonstrates the ability to make significant improvements in function in a reasonable and predictable amount of time.     Precautions / Restrictions Precautions Precautions: Fall Restrictions Weight Bearing Restrictions: No      Mobility  Bed Mobility Overal bed mobility: Needs Assistance Bed Mobility: Sit to Supine, Supine to Sit     Supine to sit: Min assist Sit to supine: Modified independent (Device/Increase time)   General bed mobility comments: Min assist for trunk support to rise to EOB. Cues for technique, able to pull through therapist hand to rise to EOB.    Transfers Overall transfer level: Needs assistance Equipment used: Rolling walker (2 wheels) Transfers: Sit to/from Stand, Bed to chair/wheelchair/BSC Sit to Stand: Min assist   Step pivot transfers: Min assist       General transfer comment: Min assist for boost to stand from bed and BSC. Cues for technique. Min assist for stability with step pivot transfer to and from BSC/Bed. Does not tolerate standing long and rushes to sit stating his legs are giving out however no buckling noted for duration pt stood while assisting with pericare after BM into bedside commode.    Ambulation/Gait               General Gait Details: declines, anxious due to weakness  Stairs            Wheelchair Mobility    Modified Rankin (Stroke Patients Only)       Balance  Overall balance assessment: Needs assistance Sitting-balance support: No upper extremity supported, Feet supported Sitting balance-Leahy Scale: Good Sitting balance - Comments: Sits without support. Trunk flexed and leaning on BSC   Standing balance support: Bilateral upper  extremity supported, During functional activity, Reliant on assistive device for balance Standing balance-Leahy Scale: Poor Standing balance comment: During peri-care, unable to tolerate long period                             Pertinent Vitals/Pain Pain Assessment Pain Assessment: No/denies pain Pain Intervention(s): Monitored during session    Home Living Family/patient expects to be discharged to:: Skilled nursing facility Living Arrangements:  (Unsure where his spouse is being cared for - daughter manages wife due to dementia.) Available Help at Discharge: Family (Daughter available intermittently - lives in [place]) Type of Home: Apartment Home Access: Level entry       Home Layout: One level Home Equipment: Rollator (4 wheels);Shower seat;Cane - single point Additional Comments: Reports wife has dementia, does not live with him any longer, unsure where she is currently.    Prior Function Prior Level of Function : Independent/Modified Independent             Mobility Comments: Rollator to ambulate. LEs buckle with longer distance. Has been occurring for 1 year. multilpe falls. ADLs Comments: Pt reports ind  prior to weakness     Hand Dominance   Dominant Hand: Right    Extremity/Trunk Assessment   Upper Extremity Assessment Upper Extremity Assessment: Defer to OT evaluation    Lower Extremity Assessment Lower Extremity Assessment: Generalized weakness    Cervical / Trunk Assessment Cervical / Trunk Assessment: Kyphotic  Communication   Communication: HOH  Cognition Arousal/Alertness: Awake/alert Behavior During Therapy: Flat affect Overall Cognitive Status: Within Functional Limits for tasks assessed                                          General Comments General comments (skin integrity, edema, etc.): Requires great motivation to participate    Exercises General Exercises - Lower Extremity Ankle Circles/Pumps:  AROM, Both, 10 reps, Supine Quad Sets: Strengthening, Both, 10 reps, Supine Gluteal Sets: Strengthening, Both, 10 reps, Supine   Assessment/Plan    PT Assessment Patient needs continued PT services  PT Problem List Decreased activity tolerance;Decreased balance;Decreased mobility;Decreased knowledge of use of DME;Decreased strength       PT Treatment Interventions DME instruction;Gait training;Functional mobility training;Therapeutic activities;Therapeutic exercise;Balance training;Patient/family education;Neuromuscular re-education    PT Goals (Current goals can be found in the Care Plan section)  Acute Rehab PT Goals Patient Stated Goal: Go to rehab PT Goal Formulation: With patient Time For Goal Achievement: 11/01/22 Potential to Achieve Goals: Fair    Frequency Min 3X/week     Co-evaluation               AM-PAC PT "6 Clicks" Mobility  Outcome Measure Help needed turning from your back to your side while in a flat bed without using bedrails?: None Help needed moving from lying on your back to sitting on the side of a flat bed without using bedrails?: A Little Help needed moving to and from a bed to a chair (including a wheelchair)?: A Little Help needed standing up from a chair using your arms (e.g., wheelchair or bedside chair)?: A Little Help  needed to walk in hospital room?: A Little Help needed climbing 3-5 steps with a railing? : A Lot 6 Click Score: 18    End of Session Equipment Utilized During Treatment: Gait belt Activity Tolerance: Patient limited by fatigue Patient left: in bed;with call bell/phone within reach;with bed alarm set Nurse Communication: Mobility status PT Visit Diagnosis: Other abnormalities of gait and mobility (R26.89);Unsteadiness on feet (R26.81);Muscle weakness (generalized) (M62.81);History of falling (Z91.81);Difficulty in walking, not elsewhere classified (R26.2)    Time: 4540-9811 PT Time Calculation (min) (ACUTE ONLY): 27  min   Charges:   PT Evaluation $PT Eval Low Complexity: 1 Low PT Treatments $Therapeutic Activity: 8-22 mins        Candie Mile, PT, DPT Physical Therapist Acute Rehabilitation Services [place]   Ellouise Newer 10/18/2022, 12:11 PM

## 2022-10-18 NOTE — Progress Notes (Signed)
PROGRESS NOTE  Albert Powell WNU:[id number] DOB: 09-23-1940 DOA: 10/16/2022 PCP: Patient, No Pcp Per   LOS: 2 days   Brief Narrative / Interim history: This is an 82 year old male with history of chronic systolic and diastolic CHF, CAD, PAF, CKD 3b, comes into the hospital with progressive generalized weakness especially for the last 3 days, associated with poor p.o. intake.  He reports occasional nausea, but no vomiting, no diarrhea.  His wife has been caring for him, and apparently has been diagnosed with dementia recently.  Denies any chest pain, denies any shortness of breath.  Subjective / 24h Interval events: -Complains of significant weakness.  No chest pain, no shortness of breath.  Has not worked with PT yet since he felt too weak yesterday  Assesement and Plan: Principal Problem:   Hypokalemia Active Problems:   Generalized weakness   Acute kidney injury superimposed on chronic kidney disease ([place])   Hyponatremia   Coronary artery disease involving native coronary artery of native heart without angina pectoris   Dyslipidemia   Type 2 diabetes mellitus with chronic kidney disease, without long-term current use of insulin ([name])   Chronic atrial fibrillation with RVR ([name])   Hypothyroidism   GERD without esophagitis   Principal problem Profound hypokalemia -patient was admitted to the hospital with a potassium of 2.2 and significant generalized weakness.  Replete aggressively potassium, improved to 3.1 this morning but still low.  Continue potassium supplementation.  Check magnesium level -Patient denies any nausea, vomiting, diarrhea but does state that he has poor p.o. intake.  He had a hospitalization earlier last month for nausea, vomiting, diarrhea but it has resolved.  Active problems CKD 3b -baseline creatinine varies between 1.2-1.48 in the past few months but as high as 1.8, 1.6 on admission and slowly creeping up.  Closely monitor.  Hyponatremia -with normal  chloride level, was dehydrated initially but has received fluids.  Sodium stable  Hypothyroidism-continue home Synthroid  Chronic combined CHF - Echo in August 2023 showed an EF of 25 to 30%, and repeat echo in December 2023 showed recovered EF of 55-60%.  Currently appears relatively euvolemic.  Patient was on chronic, but this was discontinued during his hospitalization in December when he developed sinus bradycardia with first-degree AV block.  Appears that over the last several months his spironolactone was held sometimes in December, and torsemide about a month ago when he was admitted with nausea, vomiting, diarrhea, acute kidney injury.  Closely monitor volume status  PAF -continue amiodarone, Eliquis  CAD - no chest pain.  Has mild troponin elevation, which is flat, not in a pattern consistent with ACS suggesting demand ischemia.  Continue statin, Imdur.  History of nausea, vomiting, diarrhea - this was recent, about a month ago.  He was admitted with acute kidney injury having to receive IV fluids.  His chronic torsemide was discontinued upon discharge.  Essential hypertension-continue amiodarone, hydralazine, Imdur.  Hyperlipidemia - continue statin  Type 2 diabetes mellitus -A1c acceptable in his age group.  Continue glipizide.  CBG acceptable  Lab Results  Component Value Date   HGBA1C 7.2 (H) 05/14/2022   CBG (last 3)  Recent Labs    10/16/22 1850  GLUCAP 125*       Scheduled Meds:  amiodarone  200 mg Oral BID   amLODipine  10 mg Oral Daily   apixaban  2.5 mg Oral BID   atorvastatin  40 mg Oral Daily   clopidogrel  75 mg Oral Daily  glipiZIDE  5 mg Oral QAC breakfast   hydrALAZINE  100 mg Oral TID   isosorbide mononitrate  120 mg Oral Daily   levothyroxine  75 mcg Oral QAC breakfast   nystatin   Topical BID   pantoprazole  40 mg Oral QODAY   Continuous Infusions: PRN Meds:.acetaminophen **OR** acetaminophen, magnesium hydroxide, ondansetron **OR**  ondansetron (ZOFRAN) IV, traZODone  Current Outpatient Medications  Medication Instructions   amiodarone (PACERONE) 200 mg, Oral, 2 times daily   amLODipine (NORVASC) 10 mg, Oral, Daily   apixaban (ELIQUIS) 2.5 mg, Oral, 2 times daily   atorvastatin (LIPITOR) 40 mg, Oral, Daily   clopidogrel (PLAVIX) 75 mg, Oral, Daily   glipiZIDE (GLUCOTROL) 5 mg, Oral, Daily before breakfast   hydrALAZINE (APRESOLINE) 100 mg, Oral, 3 times daily   isosorbide mononitrate (IMDUR) 120 mg, Oral, Daily   levothyroxine (SYNTHROID) 25 MCG tablet TAKE ONE TABLET BY MOUTH DAILY BEFORE BREAKFAST   Multiple Vitamin (MULTIVITAMIN WITH MINERALS) TABS tablet 1 tablet, Oral, Daily   nystatin (MYCOSTATIN/NYSTOP) powder Topical, 2 times daily   ondansetron (ZOFRAN-ODT) 4 mg, Oral, Every 8 hours PRN   pantoprazole (PROTONIX) 40 mg, Oral, Every other day   potassium chloride SA (KLOR-CON M) 20 MEQ tablet 40 mEq, Oral, 2 times daily    Diet Orders (From admission, onward)     Start     Ordered   10/16/22 2235  Diet clear liquid Room service appropriate? Yes; Fluid consistency: Thin  Diet effective now       Question Answer Comment  Room service appropriate? Yes   Fluid consistency: Thin      10/16/22 2245            DVT prophylaxis: apixaban (ELIQUIS) tablet 2.5 mg Start: 10/16/22 2300 apixaban (ELIQUIS) tablet 2.5 mg   Lab Results  Component Value Date   PLT 247 10/18/2022      Code Status: Full Code  Family Communication: no family at bedside, discussed with daughter over the phone  Status is: Inpatient  Remains inpatient appropriate because: severity of illness  Level of care: Telemetry Medical  Consultants:  none  Objective: Vitals:   10/17/22 1628 10/17/22 2023 10/18/22 0704 10/18/22 0900  BP: (!) 158/63 (!) 151/70 (!) 174/79 (!) 159/66  Pulse: (!) 58 61 71 64  Resp:  18 19 16   Temp: 97.9 F (36.6 C) 98.3 F (36.8 C) 97.9 F (36.6 C) 98 F (36.7 C)  TempSrc: Oral Oral Oral Oral   SpO2: 95% 97% 98%   Weight:      Height:        Intake/Output Summary (Last 24 hours) at 10/18/2022 1123 Last data filed at 10/18/2022 0900 Gross per 24 hour  Intake 1840 ml  Output 2200 ml  Net -360 ml    Wt Readings from Last 3 Encounters:  10/17/22 83.2 kg  10/09/22 87.6 kg  10/07/22 87.7 kg    Examination:  Constitutional: NAD Eyes: lids and conjunctivae normal, no scleral icterus ENMT: mmm Neck: normal, supple Respiratory: clear to auscultation bilaterally, no wheezing, no crackles. Normal respiratory effort.  Cardiovascular: Regular rate and rhythm, no murmurs / rubs / gallops. No LE edema. Abdomen: soft, no distention, no tenderness. Bowel sounds positive.  Skin: no rashes  Data Reviewed: I have independently reviewed following labs and imaging studies   CBC Recent Labs  Lab 10/16/22 2114 10/17/22 0736 10/18/22 0446  WBC 8.3 7.8 6.9  HGB 14.6 13.7 12.7*  HCT 41.4 38.4* 35.9*  PLT  294 262 247  MCV 85.7 84.8 86.3  MCH 30.2 30.2 30.5  MCHC 35.3 35.7 35.4  RDW 16.3* 16.2* 16.4*  LYMPHSABS 1.0  --   --   MONOABS 0.7  --   --   EOSABS 0.0  --   --   BASOSABS 0.0  --   --      Recent Labs  Lab 10/16/22 2056 10/17/22 0736 10/18/22 0446  NA 132* 131* 133*  K 2.2* 3.1* 3.8  CL 99 100 104  CO2 22 21* 22  GLUCOSE 102* 117* 101*  BUN 11 9 10   CREATININE 1.66* 1.62* 1.82*  CALCIUM 7.9* 7.8* 8.1*  AST 22  --  19  ALT 24  --  21  ALKPHOS 87  --  76  BILITOT 0.8  --  0.7  ALBUMIN 2.4*  --  2.3*  MG  --  1.8 1.6*  BNP  --  572.2*  --      ------------------------------------------------------------------------------------------------------------------ No results for input(s): "CHOL", "HDL", "LDLCALC", "TRIG", "CHOLHDL", "LDLDIRECT" in the last 72 hours.  Lab Results  Component Value Date   HGBA1C 7.2 (H) 05/14/2022   ------------------------------------------------------------------------------------------------------------------ No results for  input(s): "TSH", "T4TOTAL", "T3FREE", "THYROIDAB" in the last 72 hours.  Invalid input(s): "FREET3"  Cardiac Enzymes No results for input(s): "CKMB", "TROPONINI", "MYOGLOBIN" in the last 168 hours.  Invalid input(s): "CK" ------------------------------------------------------------------------------------------------------------------    Component Value Date/Time   BNP 572.2 (H) 10/17/2022 0736    CBG: Recent Labs  Lab 10/16/22 1850  GLUCAP 125*     No results found for this or any previous visit (from the past 240 hour(s)).   Radiology Studies: No results found.   Marzetta Board, MD, PhD [facility]  Between 7 am - 7 pm I am available, please contact me via Amion (for emergencies) or Securechat (non urgent messages)  Between 7 pm - 7 am I am not available, please contact night coverage MD/APP via Amion

## 2022-10-19 DIAGNOSIS — E876 Hypokalemia: Secondary | ICD-10-CM | POA: Diagnosis not present

## 2022-10-19 LAB — BASIC METABOLIC PANEL
Anion gap: 9 (ref 5–15)
BUN: 9 mg/dL (ref 8–23)
CO2: 21 mmol/L — ABNORMAL LOW (ref 22–32)
Calcium: 7.9 mg/dL — ABNORMAL LOW (ref 8.9–10.3)
Chloride: 100 mmol/L (ref 98–111)
Creatinine, Ser: 1.75 mg/dL — ABNORMAL HIGH (ref 0.61–1.24)
GFR, Estimated: 39 mL/min — ABNORMAL LOW (ref >60)
Glucose, Bld: 63 mg/dL — ABNORMAL LOW (ref 70–99)
Potassium: 3.5 mmol/L (ref 3.5–5.1)
Sodium: 130 mmol/L — ABNORMAL LOW (ref 135–145)

## 2022-10-19 LAB — CBC
HCT: 36 % — ABNORMAL LOW (ref 39.0–52.0)
Hemoglobin: 12.3 g/dL — ABNORMAL LOW (ref 13.0–17.0)
MCH: 29.9 pg (ref 26.0–34.0)
MCHC: 34.2 g/dL (ref 30.0–36.0)
MCV: 87.4 fL (ref 80.0–100.0)
Platelets: 245 10*3/uL (ref 150–400)
RBC: 4.12 MIL/uL — ABNORMAL LOW (ref 4.22–5.81)
RDW: 16.3 % — ABNORMAL HIGH (ref 11.5–15.5)
WBC: 8.1 10*3/uL (ref 4.0–10.5)
nRBC: 0 % (ref 0.0–0.2)

## 2022-10-19 LAB — MAGNESIUM: Magnesium: 2 mg/dL (ref 1.7–2.4)

## 2022-10-19 MED ORDER — POTASSIUM CHLORIDE CRYS ER 20 MEQ PO TBCR
40.0000 meq | EXTENDED_RELEASE_TABLET | Freq: Once | ORAL | Status: AC
  Administered 2022-10-19: 40 meq via ORAL
  Filled 2022-10-19: qty 2

## 2022-10-19 NOTE — TOC Initial Note (Signed)
Transition of Care [facility]) - Initial/Assessment Note    Patient Details  Name: Albert Powell MRN: [id number] Date of Birth: 02/13/1941  [facility] CM/SW Contact:    Amador Cunas, [place] Phone Number: 10/19/2022, 9:29 AM  Clinical Narrative:  Pt from home with wife who reportedly has dementia and is unable to provide pt with adequate care. Pt aware of recommendation for SNF and reports agreeable. Voicemail left for pt's dtr requesting return call in order to update her with dc plan. Will begin SNF search and f/u with offers as available.   Wandra Feinstein, MSW, LCSW [phone] (coverage)                   Expected Discharge Plan: [place] Barriers to Discharge: Continued Medical Work up, SNF Pending bed offer   Patient Goals and CMS Choice   CMS Medicare.gov Compare Post Acute Care list provided to:: Patient Choice offered to / list presented to : Patient, Adult Children      Expected Discharge Plan and Services     Post Acute Care Choice: [place] Living arrangements for the past 2 months: Apartment                                      Prior Living Arrangements/Services Living arrangements for the past 2 months: Apartment Lives with:: Spouse          Need for Family Participation in Patient Care: Yes (Comment) Care giver support system in place?: No (comment)   Criminal Activity/Legal Involvement Pertinent to Current Situation/Hospitalization: No - Comment as needed  Activities of Daily Living Home Assistive Devices/Equipment: Gilford Rile (specify type) ADL Screening (condition at time of admission) Patient's cognitive ability adequate to safely complete daily activities?: Yes Is the patient deaf or have difficulty hearing?: No Does the patient have difficulty seeing, even when wearing glasses/contacts?: No Does the patient have difficulty concentrating, remembering, or making decisions?: No Patient able  to express need for assistance with ADLs?: Yes Does the patient have difficulty dressing or bathing?: Yes Independently performs ADLs?: No Communication: Independent Dressing (OT): Needs assistance Is this a change from baseline?: Pre-admission baseline Grooming: Independent Feeding: Independent  Permission Sought/Granted Permission sought to share information with : Facility Art therapist granted to share information with : Yes, Verbal Permission Granted              Emotional Assessment       Orientation: : Oriented to Self, Oriented to Place, Oriented to  Time, Oriented to Situation Alcohol / Substance Use: Not Applicable Psych Involvement: No (comment)  Admission diagnosis:  Hypokalemia [E87.6] Weakness [R53.1] Nausea and vomiting, unspecified vomiting type [R11.2] Patient Active Problem List   Diagnosis Date Noted   Generalized weakness 10/17/2022   Dyslipidemia 10/17/2022   Type 2 diabetes mellitus with chronic kidney disease, without long-term current use of insulin ([place]) 10/17/2022   Chronic atrial fibrillation with RVR ([place]) 10/17/2022   Hypothyroidism 10/17/2022   GERD without esophagitis 10/17/2022   Hyponatremia 10/03/2022   Constipation 10/03/2022   Proctitis 10/03/2022   HLD (hyperlipidemia) 10/03/2022   Stage 3b chronic kidney disease (CKD) ([place]) 10/03/2022   GIB (gastrointestinal bleeding) 10/03/2022   Nausea and vomiting 09/18/2022   (HFpEF) heart failure with preserved ejection fraction ([place]) 09/18/2022   AKI (acute kidney injury) ([place]) 09/06/2022   Chronic combined systolic and diastolic CHF (congestive  heart failure) ([place]) 09/04/2022   Near syncope 09/04/2022   Hypokalemia 06/23/2022   Elevated troponin 06/23/2022   Type 2 diabetes mellitus with hyperlipidemia ([place]) 05/19/2022   Acute combined systolic and diastolic heart failure ([name])    Acute kidney injury superimposed on chronic kidney disease ([place]) 05/13/2022   GERD  (gastroesophageal reflux disease) 05/13/2022   Acquired hypothyroidism 05/13/2022   Paroxysmal atrial fibrillation ([place]) 81/09/7508   Acute metabolic encephalopathy    CHF (congestive heart failure) ([place]) 03/10/2021   Essential hypertension 03/10/2021   Hypertensive crisis 01/01/2021   ACS (acute coronary syndrome) ([place])    Lesion of right native kidney    Atrial fibrillation ([place]) 03/30/2020   Contusion of right hand 11/12/2018   Primary osteoarthritis of both first carpometacarpal joints 11/12/2018   Pain in joint of right shoulder 10/05/2018   Pain in right hand 10/05/2018   Hematoma 03/30/2018   Degeneration of lumbar intervertebral disc 12/21/2017   Lumbar radiculopathy 12/21/2017   Vertigo 04/09/2017   Hypogonadism in male 12/07/2014   Screening for prostate cancer 12/07/2014   Claudication of right lower extremity ([place]) 10/20/2013   Claudication in peripheral vascular disease- Rt leg 09/20/2013   Obesity (BMI 30-39.9)- negative sleep study in the past 09/20/2013   Occlusion and stenosis of carotid artery without mention of cerebral infarction 05/23/2013   PAD (peripheral artery disease) ([place]) 05/18/2013   Carotid artery disease ([place]) 04/14/2013   Bradycardia, sinus 02/18/2013   Tobacco abuse 02/05/2013   Chest pain, unstable angina, negative MI 02/04/2013   Coronary artery disease involving native coronary artery of native heart without angina pectoris 02/04/2013   Benign essential hypertension 07/10/2011   Incisional hernia 05/22/2011   PCP:  Patient, No Pcp Per Pharmacy:   [facility] - [place], [place] - [address] [place] [address] Phone: [phone] Fax: [phone]     Social Determinants of Health (SDOH) Social History: [place]: No Food Insecurity (10/03/2022)  Housing: Low Risk  (10/03/2022)  Transportation Needs: Unmet Transportation Needs (10/08/2022)  Utilities:  Not At Risk (10/03/2022)  Alcohol Screen: Low Risk  (06/03/2022)  Depression (PHQ2-9): High Risk (08/14/2022)  Financial Resource Strain: Low Risk  (06/03/2022)  Tobacco Use: Medium Risk (10/03/2022)   SDOH Interventions:     Readmission Risk Interventions     No data to display

## 2022-10-19 NOTE — Progress Notes (Signed)
PROGRESS NOTE  Albert Powell:[id number] DOB: November 02, 1940 DOA: 10/16/2022 PCP: Albert Powell, No Pcp Per   LOS: 3 days   Brief Narrative / Interim history: This is an 82 year old male with history of chronic systolic and diastolic CHF, CAD, PAF, CKD 3b, comes into the hospital with progressive generalized weakness especially for the last 3 days, associated with poor p.o. intake.  He reports occasional nausea, but no vomiting, no diarrhea.  His wife has been caring for him, and apparently has been diagnosed with dementia recently.  Denies any chest pain, denies any shortness of breath.  Subjective / 24h Interval events: -Feels a bit stronger today.  No chest pain, no shortness of breath.  Assesement and Plan: Principal Problem:   Hypokalemia Active Problems:   Generalized weakness   Acute kidney injury superimposed on chronic kidney disease ([name])   Hyponatremia   Coronary artery disease involving native coronary artery of native heart without angina pectoris   Dyslipidemia   Type 2 diabetes mellitus with chronic kidney disease, without long-term current use of insulin ([name])   Chronic atrial fibrillation with RVR ([name])   Hypothyroidism   GERD without esophagitis   Principal problem Profound hypokalemia, hypomagnesemia-Albert Powell was admitted to the hospital with a potassium of 2.2 and significant generalized weakness.  Replete aggressively potassium, improved to 3.1 this morning but still low.  Continue potassium supplementation today.  Magnesium normal at 2.0 today after repletion yesterday -Albert Powell denies any nausea, vomiting, diarrhea but does state that he has poor p.o. intake.  He had a hospitalization earlier last month for nausea, vomiting, diarrhea but it has resolved.  Active problems CKD 3b -baseline creatinine varies between 1.2-1.48 in the past few months but as high as 1.8, stabilized at 1.7 today.  Hyponatremia -with normal chloride level, was dehydrated initially but has  received fluids.  Sodium stable, does not need any more IV fluids  Hypothyroidism-continue home Synthroid  Chronic combined CHF - Echo in August 2023 showed an EF of 25 to 30%, and repeat echo in December 2023 showed recovered EF of 55-60%.  Currently appears relatively euvolemic.  Albert Powell was on chronic, but this was discontinued during his hospitalization in December when he developed sinus bradycardia with first-degree AV block.  Appears that over the last several months his spironolactone was held sometimes in December, and torsemide about a month ago when he was admitted with nausea, vomiting, diarrhea, acute kidney injury.  Closely monitor volume status  PAF -continue amiodarone, Eliquis  CAD - no chest pain.  Has mild troponin elevation, which is flat, not in a pattern consistent with ACS suggesting demand ischemia.  Continue statin, Imdur.  History of nausea, vomiting, diarrhea - this was recent, about a month ago.  He was admitted with acute kidney injury having to receive IV fluids.  His chronic torsemide was discontinued upon discharge.  No issues now  Essential hypertension-continue amiodarone, hydralazine, Imdur.  Hyperlipidemia - continue statin  Type 2 diabetes mellitus -A1c acceptable in his age group.  Continue glipizide.  CBG acceptable  Lab Results  Component Value Date   HGBA1C 7.2 (H) 05/14/2022   CBG (last 3)  Recent Labs    10/16/22 1850  GLUCAP 125*       Scheduled Meds:  amiodarone  200 mg Oral BID   amLODipine  10 mg Oral Daily   apixaban  2.5 mg Oral BID   atorvastatin  40 mg Oral Daily   clopidogrel  75 mg Oral Daily   glipiZIDE  5 mg Oral QAC breakfast   hydrALAZINE  100 mg Oral TID   isosorbide mononitrate  120 mg Oral Daily   levothyroxine  75 mcg Oral QAC breakfast   nystatin   Topical BID   pantoprazole  40 mg Oral QODAY   Continuous Infusions: PRN Meds:.acetaminophen **OR** acetaminophen, magnesium hydroxide, ondansetron **OR**  ondansetron (ZOFRAN) IV, traZODone  Current Outpatient Medications  Medication Instructions   amiodarone (PACERONE) 200 mg, Oral, 2 times daily   amLODipine (NORVASC) 10 mg, Oral, Daily   apixaban (ELIQUIS) 2.5 mg, Oral, 2 times daily   atorvastatin (LIPITOR) 40 mg, Oral, Daily   clopidogrel (PLAVIX) 75 mg, Oral, Daily   glipiZIDE (GLUCOTROL) 5 mg, Oral, Daily before breakfast   hydrALAZINE (APRESOLINE) 100 mg, Oral, 3 times daily   isosorbide mononitrate (IMDUR) 120 mg, Oral, Daily   levothyroxine (SYNTHROID) 25 MCG tablet TAKE ONE TABLET BY MOUTH DAILY BEFORE BREAKFAST   Multiple Vitamin (MULTIVITAMIN WITH MINERALS) TABS tablet 1 tablet, Oral, Daily   nystatin (MYCOSTATIN/NYSTOP) powder Topical, 2 times daily   ondansetron (ZOFRAN-ODT) 4 mg, Oral, Every 8 hours PRN   pantoprazole (PROTONIX) 40 mg, Oral, Every other day   potassium chloride SA (KLOR-CON M) 20 MEQ tablet 40 mEq, Oral, 2 times daily    Diet Orders (From admission, onward)     Start     Ordered   10/16/22 2235  Diet clear liquid Room service appropriate? Yes; Fluid consistency: Thin  Diet effective now       Question Answer Comment  Room service appropriate? Yes   Fluid consistency: Thin      10/16/22 2245            DVT prophylaxis: apixaban (ELIQUIS) tablet 2.5 mg Start: 10/16/22 2300 apixaban (ELIQUIS) tablet 2.5 mg   Lab Results  Component Value Date   PLT 245 10/19/2022      Code Status: Full Code  Family Communication: no family at bedside, discussed with daughter over the phone  Status is: Inpatient  Remains inpatient appropriate because: severity of illness  Level of care: Telemetry Medical  Consultants:  none  Objective: Vitals:   10/18/22 1637 10/18/22 2134 10/19/22 0604 10/19/22 0854  BP: (!) 156/73 (!) 140/51 (!) 165/81 (!) 158/77  Pulse: 65 (!) 59 71 76  Resp: 18 18 18 18   Temp: 98.2 F (36.8 C) 97.7 F (36.5 C) 98 F (36.7 C) 98.2 F (36.8 C)  TempSrc: Oral Oral Oral  Oral  SpO2: 97% 96% 93% 94%  Weight:      Height:        Intake/Output Summary (Last 24 hours) at 10/19/2022 0925 Last data filed at 10/19/2022 0900 Gross per 24 hour  Intake 1078 ml  Output 1775 ml  Net -697 ml    Wt Readings from Last 3 Encounters:  10/17/22 83.2 kg  10/09/22 87.6 kg  10/07/22 87.7 kg    Examination:  Constitutional: NAD Eyes: lids and conjunctivae normal, no scleral icterus ENMT: mmm Neck: normal, supple Respiratory: clear to auscultation bilaterally, no wheezing, no crackles. Normal respiratory effort.  Cardiovascular: Regular rate and rhythm, no murmurs / rubs / gallops. No LE edema. Abdomen: soft, no distention, no tenderness. Bowel sounds positive.  Skin: no rashes Neurologic: no focal deficits, equal strength  Data Reviewed: I have independently reviewed following labs and imaging studies   CBC Recent Labs  Lab 10/16/22 2114 10/17/22 0736 10/18/22 0446 10/19/22 0407  WBC 8.3 7.8 6.9 8.1  HGB 14.6 13.7 12.7*  12.3*  HCT 41.4 38.4* 35.9* 36.0*  PLT 294 262 247 245  MCV 85.7 84.8 86.3 87.4  MCH 30.2 30.2 30.5 29.9  MCHC 35.3 35.7 35.4 34.2  RDW 16.3* 16.2* 16.4* 16.3*  LYMPHSABS 1.0  --   --   --   MONOABS 0.7  --   --   --   EOSABS 0.0  --   --   --   BASOSABS 0.0  --   --   --      Recent Labs  Lab 10/16/22 2056 10/17/22 0736 10/18/22 0446 10/19/22 0407  NA 132* 131* 133* 130*  K 2.2* 3.1* 3.8 3.5  CL 99 100 104 100  CO2 22 21* 22 21*  GLUCOSE 102* 117* 101* 63*  BUN 11 9 10 9   CREATININE 1.66* 1.62* 1.82* 1.75*  CALCIUM 7.9* 7.8* 8.1* 7.9*  AST 22  --  19  --   ALT 24  --  21  --   ALKPHOS 87  --  76  --   BILITOT 0.8  --  0.7  --   ALBUMIN 2.4*  --  2.3*  --   MG  --  1.8 1.6* 2.0  BNP  --  572.2*  --   --      ------------------------------------------------------------------------------------------------------------------ No results for input(s): "CHOL", "HDL", "LDLCALC", "TRIG", "CHOLHDL", "LDLDIRECT" in the last  72 hours.  Lab Results  Component Value Date   HGBA1C 7.2 (H) 05/14/2022   ------------------------------------------------------------------------------------------------------------------ No results for input(s): "TSH", "T4TOTAL", "T3FREE", "THYROIDAB" in the last 72 hours.  Invalid input(s): "FREET3"  Cardiac Enzymes No results for input(s): "CKMB", "TROPONINI", "MYOGLOBIN" in the last 168 hours.  Invalid input(s): "CK" ------------------------------------------------------------------------------------------------------------------    Component Value Date/Time   BNP 572.2 (H) 10/17/2022 0736    CBG: Recent Labs  Lab 10/16/22 1850  GLUCAP 125*     No results found for this or any previous visit (from the past 240 hour(s)).   Radiology Studies: No results found.   Marzetta Board, MD, PhD [facility]  Between 7 am - 7 pm I am available, please contact me via Amion (for emergencies) or Securechat (non urgent messages)  Between 7 pm - 7 am I am not available, please contact night coverage MD/APP via Amion

## 2022-10-19 NOTE — NC FL2 (Signed)
[place] LEVEL OF CARE FORM     IDENTIFICATION  Patient Name: Albert Powell Birthdate: [birth year] Sex: male Admission Date (Current Location): 10/16/2022  [facility] and [place] Number:  Herbalist and Address:  [facility] [place], [place] [address] [place], [place] [address]      Provider Number: [id number]  Attending Physician Name and Address:  Caren Griffins, MD  Relative Name and Phone Number:       Current Level of Care: Hospital Recommended Level of Care: [place] Prior Approval Number:    Date Approved/Denied:   PASRR Number: [id number]  Discharge Plan: SNF    Current Diagnoses: Patient Active Problem List   Diagnosis Date Noted   Generalized weakness 10/17/2022   Dyslipidemia 10/17/2022   Type 2 diabetes mellitus with chronic kidney disease, without long-term current use of insulin ([place]) 10/17/2022   Chronic atrial fibrillation with RVR ([place]) 10/17/2022   Hypothyroidism 10/17/2022   GERD without esophagitis 10/17/2022   Hyponatremia 10/03/2022   Constipation 10/03/2022   Proctitis 10/03/2022   HLD (hyperlipidemia) 10/03/2022   Stage 3b chronic kidney disease (CKD) ([place]) 10/03/2022   GIB (gastrointestinal bleeding) 10/03/2022   Nausea and vomiting 09/18/2022   (HFpEF) heart failure with preserved ejection fraction ([place]) 09/18/2022   AKI (acute kidney injury) ([place]) 09/06/2022   Chronic combined systolic and diastolic CHF (congestive heart failure) ([place]) 09/04/2022   Near syncope 09/04/2022   Hypokalemia 06/23/2022   Elevated troponin 06/23/2022   Type 2 diabetes mellitus with hyperlipidemia ([place]) 05/19/2022   Acute combined systolic and diastolic heart failure ([name])    Acute kidney injury superimposed on chronic kidney disease ([place]) 05/13/2022   GERD (gastroesophageal reflux disease) 05/13/2022   Acquired hypothyroidism 05/13/2022   Paroxysmal atrial fibrillation ([place]) 41/96/2229   Acute  metabolic encephalopathy    CHF (congestive heart failure) ([place]) 03/10/2021   Essential hypertension 03/10/2021   Hypertensive crisis 01/01/2021   ACS (acute coronary syndrome) ([place])    Lesion of right native kidney    Atrial fibrillation ([place]) 03/30/2020   Contusion of right hand 11/12/2018   Primary osteoarthritis of both first carpometacarpal joints 11/12/2018   Pain in joint of right shoulder 10/05/2018   Pain in right hand 10/05/2018   Hematoma 03/30/2018   Degeneration of lumbar intervertebral disc 12/21/2017   Lumbar radiculopathy 12/21/2017   Vertigo 04/09/2017   Hypogonadism in male 12/07/2014   Screening for prostate cancer 12/07/2014   Claudication of right lower extremity ([place]) 10/20/2013   Claudication in peripheral vascular disease- Rt leg 09/20/2013   Obesity (BMI 30-39.9)- negative sleep study in the past 09/20/2013   Occlusion and stenosis of carotid artery without mention of cerebral infarction 05/23/2013   PAD (peripheral artery disease) ([place]) 05/18/2013   Carotid artery disease ([place]) 04/14/2013   Bradycardia, sinus 02/18/2013   Tobacco abuse 02/05/2013   Chest pain, unstable angina, negative MI 02/04/2013   Coronary artery disease involving native coronary artery of native heart without angina pectoris 02/04/2013   Benign essential hypertension 07/10/2011   Incisional hernia 05/22/2011    Orientation RESPIRATION BLADDER Height & Weight     Time, Self, Situation, Place  Normal Continent Weight: 183 lb 6.8 oz (83.2 kg) Height:  5\' 9"  (175.3 cm)  BEHAVIORAL SYMPTOMS/MOOD NEUROLOGICAL BOWEL NUTRITION STATUS      Continent    AMBULATORY STATUS COMMUNICATION OF NEEDS Skin   Extensive Assist Verbally Normal  Personal Care Assistance Level of Assistance  Bathing, Feeding, Dressing Bathing Assistance: Limited assistance Feeding assistance: Limited assistance Dressing Assistance: Maximum assistance     Functional Limitations Info   Sight, Hearing, Speech Sight Info: Adequate Hearing Info: Adequate Speech Info: Adequate    SPECIAL CARE FACTORS FREQUENCY  PT (By licensed PT), OT (By licensed OT)                    Contractures Contractures Info: Not present    Additional Factors Info  Code Status Code Status Info: FULL CODE             Current Medications (10/19/2022):  This is the current hospital active medication list Current Facility-Administered Medications  Medication Dose Route Frequency Provider Last Rate Last Admin   acetaminophen (TYLENOL) tablet 650 mg  650 mg Oral Q6H PRN Mansy, Jan A, MD       Or   acetaminophen (TYLENOL) suppository 650 mg  650 mg Rectal Q6H PRN Mansy, Jan A, MD       amiodarone (PACERONE) tablet 200 mg  200 mg Oral BID Mansy, Jan A, MD   200 mg at 10/19/22 0901   amLODipine (NORVASC) tablet 10 mg  10 mg Oral Daily Mansy, Jan A, MD   10 mg at 10/19/22 0901   apixaban (ELIQUIS) tablet 2.5 mg  2.5 mg Oral BID Mansy, Jan A, MD   2.5 mg at 10/19/22 0901   atorvastatin (LIPITOR) tablet 40 mg  40 mg Oral Daily Mansy, Jan A, MD   40 mg at 10/19/22 0900   clopidogrel (PLAVIX) tablet 75 mg  75 mg Oral Daily Mansy, Jan A, MD   75 mg at 10/19/22 0901   glipiZIDE (GLUCOTROL) tablet 5 mg  5 mg Oral QAC breakfast Mansy, Jan A, MD   5 mg at 10/18/22 2993   hydrALAZINE (APRESOLINE) tablet 100 mg  100 mg Oral TID Mansy, Jan A, MD   100 mg at 10/19/22 0901   isosorbide mononitrate (IMDUR) 24 hr tablet 120 mg  120 mg Oral Daily Mansy, Jan A, MD   120 mg at 10/19/22 0901   levothyroxine (SYNTHROID) tablet 75 mcg  75 mcg Oral QAC breakfast Mansy, Jan A, MD   75 mcg at 10/19/22 0901   magnesium hydroxide (MILK OF MAGNESIA) suspension 30 mL  30 mL Oral Daily PRN Mansy, Jan A, MD       nystatin (MYCOSTATIN/NYSTOP) topical powder   Topical BID Mansy, Jan A, MD   Given at 10/19/22 0901   ondansetron [facility]) tablet 4 mg  4 mg Oral Q6H PRN Mansy, Jan A, MD       Or   ondansetron [facility])  injection 4 mg  4 mg Intravenous Q6H PRN Mansy, Jan A, MD       pantoprazole (PROTONIX) EC tablet 40 mg  40 mg Oral QODAY Mansy, Jan A, MD   40 mg at 10/19/22 0901   traZODone (DESYREL) tablet 25 mg  25 mg Oral QHS PRN Mansy, Jan A, MD   25 mg at 10/17/22 2106     Discharge Medications: Please see discharge summary for a list of discharge medications.  Relevant Imaging Results:  Relevant Lab Results:   Additional Information SS# [id number]  Amador Cunas, [place]

## 2022-10-20 ENCOUNTER — Encounter (HOSPITAL_COMMUNITY): Payer: Medicare Other

## 2022-10-20 DIAGNOSIS — E876 Hypokalemia: Secondary | ICD-10-CM | POA: Diagnosis not present

## 2022-10-20 NOTE — Progress Notes (Signed)
Heart Failure Navigator Progress Note  Assessed for Heart & Vascular TOC clinic readiness.  Patient does not meet criteria due to Advanced Heart Failure Team patient.   Navigator will sign off at this time.    Juanjose Mojica, BSN, RN Heart Failure Nurse Navigator Secure Chat Only   

## 2022-10-20 NOTE — Care Management Important Message (Signed)
Important Message  Patient Details  Name: Albert Powell MRN: [id number] Date of Birth: 1941/07/31   Medicare Important Message Given:  Yes     Orbie Pyo 10/20/2022, 2:55 PM

## 2022-10-20 NOTE — TOC Progression Note (Addendum)
Transition of Care [facility]) - Initial/Assessment Note    Patient Details  Name: Albert Powell MRN: [id number] Date of Birth: 12/17/1940  Transition of [facility]) CM/SW Contact:    Milinda Antis, LCSWA Phone Number: 10/20/2022, 12:00 PM  Clinical Narrative:                 LCSW met with patient at bedside to present bed offers.  The patient choose [facility] (SNF).  LCSW contacted the facility and is awaiting a response.  1400-  LCSW received a returned call from [facility].  The facility can accept the patient tomorrow.  TOC following.  TOC following.   Expected Discharge Plan: Skilled Nursing Facility Barriers to Discharge: Continued Medical Work up, SNF Pending bed offer   Patient Goals and CMS Choice   CMS Medicare.gov Compare Post Acute Care list provided to:: Patient Choice offered to / list presented to : Patient, Adult Children      Expected Discharge Plan and Services     Post Acute Care Choice: [place] Living arrangements for the past 2 months: Apartment                                      Prior Living Arrangements/Services Living arrangements for the past 2 months: Apartment Lives with:: Spouse          Need for Family Participation in Patient Care: Yes (Comment) Care giver support system in place?: No (comment)   Criminal Activity/Legal Involvement Pertinent to Current Situation/Hospitalization: No - Comment as needed  Activities of Daily Living Home Assistive Devices/Equipment: Gilford Rile (specify type) ADL Screening (condition at time of admission) Patient's cognitive ability adequate to safely complete daily activities?: Yes Is the patient deaf or have difficulty hearing?: No Does the patient have difficulty seeing, even when wearing glasses/contacts?: No Does the patient have difficulty concentrating, remembering, or making decisions?: No Patient able to express need for assistance with ADLs?: Yes Does the patient have  difficulty dressing or bathing?: Yes Independently performs ADLs?: No Communication: Independent Dressing (OT): Needs assistance Is this a change from baseline?: Pre-admission baseline Grooming: Independent Feeding: Independent  Permission Sought/Granted Permission sought to share information with : Facility Art therapist granted to share information with : Yes, Verbal Permission Granted              Emotional Assessment       Orientation: : Oriented to Self, Oriented to Place, Oriented to  Time, Oriented to Situation Alcohol / Substance Use: Not Applicable Psych Involvement: No (comment)  Admission diagnosis:  Hypokalemia [E87.6] Weakness [R53.1] Nausea and vomiting, unspecified vomiting type [R11.2] Patient Active Problem List   Diagnosis Date Noted   Generalized weakness 10/17/2022   Dyslipidemia 10/17/2022   Type 2 diabetes mellitus with chronic kidney disease, without long-term current use of insulin ([place]) 10/17/2022   Chronic atrial fibrillation with RVR ([place]) 10/17/2022   Hypothyroidism 10/17/2022   GERD without esophagitis 10/17/2022   Hyponatremia 10/03/2022   Constipation 10/03/2022   Proctitis 10/03/2022   HLD (hyperlipidemia) 10/03/2022   Stage 3b chronic kidney disease (CKD) ([place]) 10/03/2022   GIB (gastrointestinal bleeding) 10/03/2022   Nausea and vomiting 09/18/2022   (HFpEF) heart failure with preserved ejection fraction ([place]) 09/18/2022   AKI (acute kidney injury) ([place]) 09/06/2022   Chronic combined systolic and diastolic CHF (congestive heart failure) ([place]) 09/04/2022   Near syncope 09/04/2022   Hypokalemia  06/23/2022   Elevated troponin 06/23/2022   Type 2 diabetes mellitus with hyperlipidemia ([place]) 05/19/2022   Acute combined systolic and diastolic heart failure ([name])    Acute kidney injury superimposed on chronic kidney disease ([place]) 05/13/2022   GERD (gastroesophageal reflux disease) 05/13/2022   Acquired hypothyroidism  05/13/2022   Paroxysmal atrial fibrillation ([place]) 85/63/1497   Acute metabolic encephalopathy    CHF (congestive heart failure) ([place]) 03/10/2021   Essential hypertension 03/10/2021   Hypertensive crisis 01/01/2021   ACS (acute coronary syndrome) ([place])    Lesion of right native kidney    Atrial fibrillation ([place]) 03/30/2020   Contusion of right hand 11/12/2018   Primary osteoarthritis of both first carpometacarpal joints 11/12/2018   Pain in joint of right shoulder 10/05/2018   Pain in right hand 10/05/2018   Hematoma 03/30/2018   Degeneration of lumbar intervertebral disc 12/21/2017   Lumbar radiculopathy 12/21/2017   Vertigo 04/09/2017   Hypogonadism in male 12/07/2014   Screening for prostate cancer 12/07/2014   Claudication of right lower extremity ([place]) 10/20/2013   Claudication in peripheral vascular disease- Rt leg 09/20/2013   Obesity (BMI 30-39.9)- negative sleep study in the past 09/20/2013   Occlusion and stenosis of carotid artery without mention of cerebral infarction 05/23/2013   PAD (peripheral artery disease) ([place]) 05/18/2013   Carotid artery disease ([place]) 04/14/2013   Bradycardia, sinus 02/18/2013   Tobacco abuse 02/05/2013   Chest pain, unstable angina, negative MI 02/04/2013   Coronary artery disease involving native coronary artery of native heart without angina pectoris 02/04/2013   Benign essential hypertension 07/10/2011   Incisional hernia 05/22/2011   PCP:  Patient, No Pcp Per Pharmacy:   [facility] - [place], [place] - [address] [place] [address] Phone: [phone] Fax: [phone]     Social Determinants of Health (SDOH) Social History: [place]: No Food Insecurity (10/03/2022)  Housing: Low Risk  (10/03/2022)  Transportation Needs: Unmet Transportation Needs (10/08/2022)  Utilities: Not At Risk (10/03/2022)  Alcohol Screen: Low Risk  (06/03/2022)   Depression (PHQ2-9): High Risk (08/14/2022)  Financial Resource Strain: Low Risk  (06/03/2022)  Tobacco Use: Medium Risk (10/03/2022)   SDOH Interventions:     Readmission Risk Interventions     No data to display

## 2022-10-20 NOTE — Discharge Summary (Signed)
Physician Discharge Summary  Albert Powell FYB:[id number] DOB: 12/26/1940 DOA: 10/16/2022  PCP: Patient, No Pcp Per  Admit date: 10/16/2022 Discharge date: 10/21/2022  Admitted From: home Disposition:  SNF  Recommendations for Outpatient Follow-up:  Follow up with PCP in 1-2 weeks Please obtain BMP/CBC in one week  Home Health: none Equipment/Devices: none  Discharge Condition: stable CODE STATUS: Full code Diet Orders (From admission, onward)     Start     Ordered   10/20/22 0804  Diet regular Fluid consistency: Thin  Diet effective now       Question:  Fluid consistency:  Answer:  Thin   10/20/22 0803            HPI: Per admitting MD, Albert Powell is a 82 y.o. male with medical history significant for coronary artery disease, atrial fibrillation, carotid artery disease, CHF, stage III CKD, COPD, GERD, type 2 diabetes mellitus, hypertension and dyslipidemia, who presented to the ER with acute onset of generalized weakness for the last 3 days with associated recurrent nausea and vomiting.  At home his wife takes care of him and she apparently has recent diagnosis of dementia and cannot take care of him anymore per his report.  He denies any headache or dizziness or blurred vision.  He had mild diarrhea initially that subsided without melena or bright red bleeding per rectum.  Denies any bilious vomitus or hematemesis.  No dysuria, oliguria or hematuria or flank pain.   Hospital Course / Discharge diagnoses: Principal Problem:   Hypokalemia Active Problems:   Generalized weakness   Acute kidney injury superimposed on chronic kidney disease ([name])   Hyponatremia   Coronary artery disease involving native coronary artery of native heart without angina pectoris   Dyslipidemia   Type 2 diabetes mellitus with chronic kidney disease, without long-term current use of insulin ([name])   Chronic atrial fibrillation with RVR ([name])   Hypothyroidism   GERD without  esophagitis   Principal problem Profound hypokalemia, hypomagnesemia - patient was admitted to the hospital with a potassium of 2.2 and significant generalized weakness.  Potassium and magnesium were replaced, now significantly better, and overall he is feeling stronger. Patient denies any nausea, vomiting, diarrhea but does state that he has poor p.o. intake.  He had a hospitalization earlier last month for nausea, vomiting, diarrhea but it has resolved. PT recommends SNF   Active problems CKD 3b -baseline creatinine varies between 1.2-1.48 in the past few months but as high as 1.8, now stabilized at 1.7.  Continue to monitor as an outpatient  Hyponatremia -overall stable Hypothyroidism-continue home Synthroid Chronic combined CHF - Echo in August 2023 showed an EF of 25 to 30%, and repeat echo in December 2023 showed recovered EF of 55-60%.  Currently appears euvolemic.  Patient was on a beta-blocker, but this was discontinued during his hospitalization in December when he developed sinus bradycardia with first-degree AV block.  Appears that over the last several months his spironolactone was held sometimes in December, and torsemide about a month ago when he was admitted with nausea, vomiting, diarrhea, acute kidney injury. Closely monitor volume status as an outpatient and initiate diuretics if clinically indicated PAF -continue amiodarone, Eliquis CAD - no chest pain.  Has mild troponin elevation, which is flat, not in a pattern consistent with ACS suggesting demand ischemia.  Continue statin, Imdur. History of nausea, vomiting, diarrhea - this was recent, about a month ago.  He was admitted with acute kidney injury having  to receive IV fluids.  His chronic torsemide was discontinued upon discharge.  No issues now Essential hypertension-continue amiodarone, hydralazine, Imdur.  Hyperlipidemia - continue statin Type 2 diabetes mellitus -A1c acceptable in his age group.  Continue home  regimen  Sepsis ruled out   Discharge Instructions   Allergies as of 10/21/2022       Reactions   Fentanyl Other (See Comments)   Behavioral changes   Gabapentin Swelling   Lisinopril Other (See Comments)   Unknown reaction   Lyrica [pregabalin] Other (See Comments)   Unknown reaction   Metformin Diarrhea   Propofol Other (See Comments)   Heart rate dropped        Medication List     TAKE these medications    amiodarone 200 MG tablet Commonly known as: PACERONE Take 1 tablet (200 mg total) by mouth 2 (two) times daily.   amLODipine 10 MG tablet Commonly known as: NORVASC Take 1 tablet (10 mg total) by mouth daily.   apixaban 2.5 MG Tabs tablet Commonly known as: ELIQUIS Take 1 tablet (2.5 mg total) by mouth 2 (two) times daily.   atorvastatin 40 MG tablet Commonly known as: LIPITOR Take 1 tablet (40 mg total) by mouth daily.   clopidogrel 75 MG tablet Commonly known as: PLAVIX Take 1 tablet (75 mg total) by mouth daily.   glipiZIDE 5 MG tablet Commonly known as: GLUCOTROL Take 5 mg by mouth daily before breakfast.   hydrALAZINE 100 MG tablet Commonly known as: APRESOLINE Take 1 tablet (100 mg total) by mouth 3 (three) times daily.   isosorbide mononitrate 120 MG 24 hr tablet Commonly known as: IMDUR Take 1 tablet (120 mg total) by mouth daily.   levothyroxine 25 MCG tablet Commonly known as: SYNTHROID TAKE ONE TABLET BY MOUTH DAILY BEFORE BREAKFAST What changed: See the new instructions.   multivitamin with minerals Tabs tablet Take 1 tablet by mouth daily.   nystatin powder Commonly known as: MYCOSTATIN/NYSTOP Apply topically 2 (two) times daily.   ondansetron 4 MG disintegrating tablet Commonly known as: ZOFRAN-ODT Take 1 tablet (4 mg total) by mouth every 8 (eight) hours as needed for up to 15 doses for nausea or vomiting.   pantoprazole 40 MG tablet Commonly known as: PROTONIX Take 1 tablet (40 mg total) by mouth every other day.    potassium chloride SA 20 MEQ tablet Commonly known as: KLOR-CON M Take 2 tablets (40 mEq total) by mouth 2 (two) times daily.       Consultations: none  Procedures/Studies:  CT Head Wo Contrast  Result Date: 10/16/2022 CLINICAL DATA:  Fall yesterday EXAM: CT HEAD WITHOUT CONTRAST TECHNIQUE: Contiguous axial images were obtained from the base of the skull through the vertex without intravenous contrast. RADIATION DOSE REDUCTION: This exam was performed according to the departmental dose-optimization program which includes automated exposure control, adjustment of the mA and/or kV according to patient size and/or use of iterative reconstruction technique. COMPARISON:  09/04/2022 FINDINGS: Brain: No evidence of acute infarction, hemorrhage, hydrocephalus, extra-axial collection or mass lesion/mass effect. Periventricular and deep white matter hypodensity. Vascular: No hyperdense vessel or unexpected calcification. Skull: Normal. Negative for fracture or focal lesion. Sinuses/Orbits: Partial opacification of the ethmoid air cells. Other: None. IMPRESSION: No acute intracranial pathology. Small-vessel white matter disease. Electronically Signed   By: Jearld Lesch M.D.   On: 10/16/2022 19:06   DG Hip Unilat With Pelvis 2-3 Views Right  Result Date: 10/16/2022 CLINICAL DATA:  Right hip pain post fall  EXAM: DG HIP (WITH OR WITHOUT PELVIS) 3V RIGHT COMPARISON:  None Available. FINDINGS: Patient rotation somewhat limits evaluation. No evidence of hip fracture or dislocation. Generator pack overlying the right iliac bone with lead extending superiorly. Mild degenerative changes of the bilateral hips. Moderate degenerative changes of the partially visualized lower lumbar spine. Vascular calcifications and vascular stents. IMPRESSION: No radiographic evidence of hip fracture or dislocation. Electronically Signed   By: Yetta Glassman M.D.   On: 10/16/2022 18:54   DG Chest 1 View  Result Date:  10/16/2022 CLINICAL DATA:  Weakness, fall EXAM: CHEST  1 VIEW COMPARISON:  09/17/2022 FINDINGS: The heart size and mediastinal contours are within normal limits. Both lungs are clear. The visualized skeletal structures are unremarkable. IMPRESSION: No acute abnormality of the lungs in AP portable projection. Electronically Signed   By: Delanna Ahmadi M.D.   On: 10/16/2022 18:53   CT ABDOMEN PELVIS WO CONTRAST  Result Date: 10/03/2022 CLINICAL DATA:  Abdominal pain EXAM: CT ABDOMEN AND PELVIS WITHOUT CONTRAST TECHNIQUE: Multidetector CT imaging of the abdomen and pelvis was performed following the standard protocol without IV contrast. RADIATION DOSE REDUCTION: This exam was performed according to the departmental dose-optimization program which includes automated exposure control, adjustment of the mA and/or kV according to patient size and/or use of iterative reconstruction technique. COMPARISON:  CT abdomen and pelvis dated September 18, 2022 FINDINGS: Lower chest: Solid pulmonary nodule of the right lower lobe measuring 5 mm on series 4, image 20, unchanged when compared with recent prior. Small hiatal hernia. Hepatobiliary: No focal liver abnormality. Hyperdense material seen in the gallbladder, likely due to sludge. No biliary ductal dilation. Pancreas: Unremarkable. No pancreatic ductal dilatation or surrounding inflammatory changes. Spleen: Normal in size without focal abnormality. Adrenals/Urinary Tract: Stable low-attenuation left adrenal gland nodule likely benign adenoma with no further follow-up imaging recommended. No hydronephrosis or nephrolithiasis. Atrophic left kidney. Unchanged bilateral renal cysts, anterior right renal cyst is hyperdense and likely proteinaceous, no specific follow-up imaging is recommended. Bladder is unremarkable. Stomach/Bowel: Stomach is within normal limits. Prior colectomy with ileorectal anastomosis. Mild rectal wall thickening. No evidence of obstruction.  Vascular/Lymphatic: Aortic atherosclerosis. No enlarged abdominal or pelvic lymph nodes. Reproductive: Prostate is unremarkable. Other: Ventral abdominal wall hernia containing a nondilated loop of small bowel. No abdominopelvic ascites. Musculoskeletal: No acute or significant osseous findings. IMPRESSION: 1. No acute findings in the abdomen or pelvis. 2. Mild rectal wall thickening, concerning for proctitis. 3. Ventral abdominal wall hernia containing a nondilated loop of small bowel. 4. Irregular solid pulmonary nodule of the right lower lobe measuring 5 mm. Although likely benign, if the patient is high-risk, given the morphology a non-contrast chest CT can be considered in 12 months.This recommendation follows the consensus statement: Guidelines for Management of Incidental Pulmonary Nodules Detected on CT Images: From the Fleischner Society 2017; Radiology 2017; 284:228-243. 5. Aortic Atherosclerosis (ICD10-I70.0). Electronically Signed   By: Yetta Glassman M.D.   On: 10/03/2022 10:34     Subjective: - no chest pain, shortness of breath, no abdominal pain, nausea or vomiting.   Discharge Exam: General: Pt is alert, awake, not in acute distress Cardiovascular: RRR, S1/S2 +, no rubs, no gallops Respiratory: CTA bilaterally, no wheezing, no rhonchi Abdominal: Soft, NT, ND, bowel sounds + Extremities: no edema, no cyanosis   The results of significant diagnostics from this hospitalization (including imaging, microbiology, ancillary and laboratory) are listed below for reference.     Microbiology: No results found for this or any  previous visit (from the past 240 hour(s)).   Labs: Basic Metabolic Panel: Recent Labs  Lab 10/16/22 2056 10/17/22 0736 10/18/22 0446 10/19/22 0407  NA 132* 131* 133* 130*  K 2.2* 3.1* 3.8 3.5  CL 99 100 104 100  CO2 22 21* 22 21*  GLUCOSE 102* 117* 101* 63*  BUN 11 9 10 9   CREATININE 1.66* 1.62* 1.82* 1.75*  CALCIUM 7.9* 7.8* 8.1* 7.9*  MG  --  1.8  1.6* 2.0   Liver Function Tests: Recent Labs  Lab 10/16/22 2056 10/18/22 0446  AST 22 19  ALT 24 21  ALKPHOS 87 76  BILITOT 0.8 0.7  PROT 5.3* 4.8*  ALBUMIN 2.4* 2.3*   CBC: Recent Labs  Lab 10/16/22 2114 10/17/22 0736 10/18/22 0446 10/19/22 0407  WBC 8.3 7.8 6.9 8.1  NEUTROABS 6.4  --   --   --   HGB 14.6 13.7 12.7* 12.3*  HCT 41.4 38.4* 35.9* 36.0*  MCV 85.7 84.8 86.3 87.4  PLT 294 262 247 245   CBG: Recent Labs  Lab 10/16/22 1850  GLUCAP 125*   Hgb A1c No results for input(s): "HGBA1C" in the last 72 hours. Lipid Profile No results for input(s): "CHOL", "HDL", "LDLCALC", "TRIG", "CHOLHDL", "LDLDIRECT" in the last 72 hours. Thyroid function studies No results for input(s): "TSH", "T4TOTAL", "T3FREE", "THYROIDAB" in the last 72 hours.  Invalid input(s): "FREET3" Urinalysis    Component Value Date/Time   COLORURINE YELLOW 10/16/2022 [place] 10/16/2022 1858   LABSPEC 1.004 (L) 10/16/2022 1858   PHURINE 7.0 10/16/2022 1858   GLUCOSEU 50 (A) 10/16/2022 1858   HGBUR SMALL (A) 10/16/2022 1858   BILIRUBINUR NEGATIVE 10/16/2022 1858   KETONESUR NEGATIVE 10/16/2022 1858   PROTEINUR >=300 (A) 10/16/2022 1858   UROBILINOGEN 1.0 05/31/2013 1432   NITRITE NEGATIVE 10/16/2022 1858   LEUKOCYTESUR NEGATIVE 10/16/2022 1858    FURTHER DISCHARGE INSTRUCTIONS:   Get Medicines reviewed and adjusted: Please take all your medications with you for your next visit with your Primary MD   Laboratory/radiological data: Please request your Primary MD to go over all hospital tests and procedure/radiological results at the follow up, please ask your Primary MD to get all Hospital records sent to his/her office.   In some cases, they will be blood work, cultures and biopsy results pending at the time of your discharge. Please request that your primary care M.D. goes through all the records of your hospital data and follows up on these results.   Also Note the  following: If you experience worsening of your admission symptoms, develop shortness of breath, life threatening emergency, suicidal or homicidal thoughts you must seek medical attention immediately by calling 911 or calling your MD immediately  if symptoms less severe.   You must read complete instructions/literature along with all the possible adverse reactions/side effects for all the Medicines you take and that have been prescribed to you. Take any new Medicines after you have completely understood and accpet all the possible adverse reactions/side effects.    Do not drive when taking Pain medications or sleeping medications (Benzodaizepines)   Do not take more than prescribed Pain, Sleep and Anxiety Medications. It is not advisable to combine anxiety,sleep and pain medications without talking with your primary care practitioner   Special Instructions: If you have smoked or chewed Tobacco  in the last 2 yrs please stop smoking, stop any regular Alcohol  and or any Recreational drug use.   Wear Seat belts while  driving.   Please note: You were cared for by a hospitalist during your hospital stay. Once you are discharged, your primary care physician will handle any further medical issues. Please note that NO REFILLS for any discharge medications will be authorized once you are discharged, as it is imperative that you return to your primary care physician (or establish a relationship with a primary care physician if you do not have one) for your post hospital discharge needs so that they can reassess your need for medications and monitor your lab values.  Time coordinating discharge: 40 minutes  SIGNED:  Marzetta Board, MD, PhD 10/21/2022, 7:15 AM

## 2022-10-20 NOTE — Progress Notes (Signed)
Physical Therapy Treatment Patient Details Name: Albert Powell MRN: [id number] DOB: [birth year] Today's Date: 10/20/2022   History of Present Illness 82 year old male admitted 2/1 with hypokalemia, generalized weakness, and poor p.o. intake. PMHx of CHF, CAD, PAF, CKD 3b.    PT Comments    Good participation with therapy today. Performed LE exercises seated at EOB. Focused on transfer training, pre-gait, lateral steps, and pivot transfer to recliner. Performed with min assist for sit<>stand and steps to recliner. Very anxious about standing with RW support and close guarding from therapist. Comfortable in recliner, alarm set, will call for staff to assist back to bed with min assist. Patient will continue to benefit from skilled physical therapy services to further improve independence with functional mobility.    Recommendations for follow up therapy are one component of a multi-disciplinary discharge planning process, led by the attending physician.  Recommendations may be updated based on patient status, additional functional criteria and insurance authorization.  Follow Up Recommendations  Skilled nursing-short term rehab (<3 hours/day) Can patient physically be transported by private vehicle: Yes   Assistance Recommended at Discharge Intermittent Supervision/Assistance  Patient can return home with the following Assistance with cooking/housework;Assist for transportation;Help with stairs or ramp for entrance;A little help with walking and/or transfers;A little help with bathing/dressing/bathroom   Equipment Recommendations  None recommended by PT    Recommendations for Other Services       Precautions / Restrictions Precautions Precautions: Fall Restrictions Weight Bearing Restrictions: No     Mobility  Bed Mobility Overal bed mobility: Needs Assistance Bed Mobility: Supine to Sit     Supine to sit: Min assist     General bed mobility comments: Min assist for trunk  support to rise to EOB. Cues for technique, able to pull through therapist hand to rise to EOB.    Transfers Overall transfer level: Needs assistance Equipment used: Rolling walker (2 wheels) Transfers: Sit to/from Stand, Bed to chair/wheelchair/BSC Sit to Stand: Min assist   Step pivot transfers: Min assist       General transfer comment: Min assist for boost to stand with cues for hand placement. A little weak with initial rise but then LEs power through and shows good stability once upright with RW for support. Pt anxious in standing, likely due to repeated falls at home, although no buckling present during tx today. Performed lateral steps along bed before resting and repeating. Finally performed step pivot transfer to recliner assisting with RW control and cues for sequencing.    Ambulation/Gait             Pre-gait activities: Standing balance, progressing with weight shifting lateral and forward/backwards, small stationary steps. General Gait Details: Declines, anxious but willing to perform pre-gait and transfer.   Stairs             Wheelchair Mobility    Modified Rankin (Stroke Patients Only)       Balance Overall balance assessment: Needs assistance Sitting-balance support: No upper extremity supported, Feet supported Sitting balance-Leahy Scale: Good Sitting balance - Comments: Sits without support.   Standing balance support: Bilateral upper extremity supported, During functional activity, Reliant on assistive device for balance Standing balance-Leahy Scale: Poor Standing balance comment: reliant on RW for support                            Cognition Arousal/Alertness: Awake/alert Behavior During Therapy: Flat affect, Anxious Overall Cognitive Status: Within Functional Limits  for tasks assessed                                          Exercises General Exercises - Lower Extremity Ankle Circles/Pumps: AROM, Both, 10  reps, Supine Quad Sets: Strengthening, Both, 10 reps, Seated Gluteal Sets: Strengthening, Both, 10 reps, Seated Long Arc Quad: Strengthening, Both, 10 reps, Seated Hip ABduction/ADduction: Strengthening, Both, 10 reps, Seated (pillow squeeze) Hip Flexion/Marching: Strengthening, Both, 5 reps, Seated Toe Raises: Strengthening, Both, 15 reps, Seated Heel Raises: Strengthening, Both, 10 reps, Seated    General Comments        Pertinent Vitals/Pain Pain Assessment Pain Assessment: No/denies pain    Home Living                          Prior Function            PT Goals (current goals can now be found in the care plan section) Acute Rehab PT Goals Patient Stated Goal: Go to rehab PT Goal Formulation: With patient Time For Goal Achievement: 11/01/22 Potential to Achieve Goals: Fair Progress towards PT goals: Progressing toward goals    Frequency    Min 3X/week      PT Plan Current plan remains appropriate    Co-evaluation              AM-PAC PT "6 Clicks" Mobility   Outcome Measure  Help needed turning from your back to your side while in a flat bed without using bedrails?: None Help needed moving from lying on your back to sitting on the side of a flat bed without using bedrails?: A Little Help needed moving to and from a bed to a chair (including a wheelchair)?: A Little Help needed standing up from a chair using your arms (e.g., wheelchair or bedside chair)?: A Little Help needed to walk in hospital room?: A Little Help needed climbing 3-5 steps with a railing? : A Lot 6 Click Score: 18    End of Session Equipment Utilized During Treatment: Gait belt Activity Tolerance: Patient tolerated treatment well (Gait training limited by anxiety) Patient left: with call bell/phone within reach;in chair;with chair alarm set Nurse Communication: Mobility status PT Visit Diagnosis: Other abnormalities of gait and mobility (R26.89);Unsteadiness on feet  (R26.81);Muscle weakness (generalized) (M62.81);History of falling (Z91.81);Difficulty in walking, not elsewhere classified (R26.2)     Time: 8242-3536 PT Time Calculation (min) (ACUTE ONLY): 17 min  Charges:  $Therapeutic Exercise: 8-22 mins                     Candie Mile, PT, DPT Physical Therapist Acute [facility] [place]    Ellouise Newer 10/20/2022, 3:47 PM

## 2022-10-20 NOTE — Progress Notes (Signed)
PROGRESS NOTE  Albert Powell UVO:[id number] DOB: [birth year] DOA: 10/16/2022 PCP: Patient, No Pcp Per   LOS: 4 days   Brief Narrative / Interim history: This is an 82 year old male with history of chronic systolic and diastolic CHF, CAD, PAF, CKD 3b, comes into the hospital with progressive generalized weakness especially for the last 3 days, associated with poor p.o. intake.  He reports occasional nausea, but no vomiting, no diarrhea.  His wife has been caring for him, and apparently has been diagnosed with dementia recently.  Denies any chest pain, denies any shortness of breath.  Subjective / 24h Interval events: -feels ok. Waiting on SNF  Assesement and Plan: Principal Problem:   Hypokalemia Active Problems:   Generalized weakness   Acute kidney injury superimposed on chronic kidney disease ([name])   Hyponatremia   Coronary artery disease involving native coronary artery of native heart without angina pectoris   Dyslipidemia   Type 2 diabetes mellitus with chronic kidney disease, without long-term current use of insulin ([name])   Chronic atrial fibrillation with RVR ([name])   Hypothyroidism   GERD without esophagitis   Principal problem Profound hypokalemia, hypomagnesemia-patient was admitted to the hospital with a potassium of 2.2 and significant generalized weakness.  Replete aggressively potassium, improved to 3.1 this morning but still low.  Continue to replete K and mag -Patient denies any nausea, vomiting, diarrhea but does state that he has poor p.o. intake.  He had a hospitalization earlier last month for nausea, vomiting, diarrhea but it has resolved.  Active problems CKD 3b -baseline creatinine varies between 1.2-1.48 in the past few months but as high as 1.8, stabilized at 1.7  Hyponatremia -overall stable  Hypothyroidism-continue home Synthroid  Chronic combined CHF - Echo in August 2023 showed an EF of 25 to 30%, and repeat echo in December 2023 showed recovered EF  of 55-60%.  Currently appears relatively euvolemic.  Patient was on chronic, but this was discontinued during his hospitalization in December when he developed sinus bradycardia with first-degree AV block.  Appears that over the last several months his spironolactone was held sometimes in December, and torsemide about a month ago when he was admitted with nausea, vomiting, diarrhea, acute kidney injury.  Appears euvolemic  PAF -continue amiodarone, Eliquis  CAD - no chest pain.  Has mild troponin elevation, which is flat, not in a pattern consistent with ACS suggesting demand ischemia.  Continue statin, Imdur.  History of nausea, vomiting, diarrhea - this was recent, about a month ago.  He was admitted with acute kidney injury having to receive IV fluids.  His chronic torsemide was discontinued upon discharge.  No issues now  Essential hypertension-continue amiodarone, hydralazine, Imdur.  Continue to monitor blood pressure trend  Hyperlipidemia - continue statin  Type 2 diabetes mellitus -A1c acceptable in his age group.  Continue glipizide.  CBG acceptable  Lab Results  Component Value Date   HGBA1C 7.2 (H) 05/14/2022   CBG (last 3)  No results for input(s): "GLUCAP" in the last 72 hours.     Scheduled Meds:  amiodarone  200 mg Oral BID   amLODipine  10 mg Oral Daily   apixaban  2.5 mg Oral BID   atorvastatin  40 mg Oral Daily   clopidogrel  75 mg Oral Daily   glipiZIDE  5 mg Oral QAC breakfast   hydrALAZINE  100 mg Oral TID   isosorbide mononitrate  120 mg Oral Daily   levothyroxine  75 mcg Oral QAC breakfast  nystatin   Topical BID   pantoprazole  40 mg Oral QODAY   Continuous Infusions: PRN Meds:.acetaminophen **OR** acetaminophen, magnesium hydroxide, ondansetron **OR** ondansetron (ZOFRAN) IV, traZODone  Current Outpatient Medications  Medication Instructions   amiodarone (PACERONE) 200 mg, Oral, 2 times daily   amLODipine (NORVASC) 10 mg, Oral, Daily   apixaban  (ELIQUIS) 2.5 mg, Oral, 2 times daily   atorvastatin (LIPITOR) 40 mg, Oral, Daily   clopidogrel (PLAVIX) 75 mg, Oral, Daily   glipiZIDE (GLUCOTROL) 5 mg, Oral, Daily before breakfast   hydrALAZINE (APRESOLINE) 100 mg, Oral, 3 times daily   isosorbide mononitrate (IMDUR) 120 mg, Oral, Daily   levothyroxine (SYNTHROID) 25 MCG tablet TAKE ONE TABLET BY MOUTH DAILY BEFORE BREAKFAST   Multiple Vitamin (MULTIVITAMIN WITH MINERALS) TABS tablet 1 tablet, Oral, Daily   nystatin (MYCOSTATIN/NYSTOP) powder Topical, 2 times daily   ondansetron (ZOFRAN-ODT) 4 mg, Oral, Every 8 hours PRN   pantoprazole (PROTONIX) 40 mg, Oral, Every other day   potassium chloride SA (KLOR-CON M) 20 MEQ tablet 40 mEq, Oral, 2 times daily    Diet Orders (From admission, onward)     Start     Ordered   10/20/22 0804  Diet regular Fluid consistency: Thin  Diet effective now       Question:  Fluid consistency:  Answer:  Thin   10/20/22 0803            DVT prophylaxis: apixaban (ELIQUIS) tablet 2.5 mg Start: 10/16/22 2300 apixaban (ELIQUIS) tablet 2.5 mg   Lab Results  Component Value Date   PLT 245 10/19/2022      Code Status: Full Code  Family Communication: no family at bedside, discussed with daughter over the phone  Status is: Inpatient  Remains inpatient appropriate because: severity of illness  Level of care: Telemetry Medical  Consultants:  none  Objective: Vitals:   10/19/22 2128 10/20/22 0509 10/20/22 0826 10/20/22 0905  BP: (!) 181/78 (!) 163/93 (!) 159/60 (!) 182/71  Pulse: 71 83 70 61  Resp: 16 18 18    Temp: 97.7 F (36.5 C) 98 F (36.7 C) 98.2 F (36.8 C) (!) 97.5 F (36.4 C)  TempSrc: Oral Oral Oral Oral  SpO2: 95% 98% 98% 99%  Weight:      Height:        Intake/Output Summary (Last 24 hours) at 10/20/2022 1444 Last data filed at 10/20/2022 1300 Gross per 24 hour  Intake 490 ml  Output 1650 ml  Net -1160 ml    Wt Readings from Last 3 Encounters:  10/17/22 83.2 kg   10/09/22 87.6 kg  10/07/22 87.7 kg    Examination:  Constitutional: NAD Eyes: lids and conjunctivae normal, no scleral icterus ENMT: mmm Neck: normal, supple Respiratory: clear to auscultation bilaterally, no wheezing, no crackles. Normal respiratory effort.  Cardiovascular: Regular rate and rhythm, no murmurs / rubs / gallops. No LE edema. Abdomen: soft, no distention, no tenderness. Bowel sounds positive.   Data Reviewed: I have independently reviewed following labs and imaging studies   CBC Recent Labs  Lab 10/16/22 2114 10/17/22 0736 10/18/22 0446 10/19/22 0407  WBC 8.3 7.8 6.9 8.1  HGB 14.6 13.7 12.7* 12.3*  HCT 41.4 38.4* 35.9* 36.0*  PLT 294 262 247 245  MCV 85.7 84.8 86.3 87.4  MCH 30.2 30.2 30.5 29.9  MCHC 35.3 35.7 35.4 34.2  RDW 16.3* 16.2* 16.4* 16.3*  LYMPHSABS 1.0  --   --   --   MONOABS 0.7  --   --   --  EOSABS 0.0  --   --   --   BASOSABS 0.0  --   --   --      Recent Labs  Lab 10/16/22 2056 10/17/22 0736 10/18/22 0446 10/19/22 0407  NA 132* 131* 133* 130*  K 2.2* 3.1* 3.8 3.5  CL 99 100 104 100  CO2 22 21* 22 21*  GLUCOSE 102* 117* 101* 63*  BUN 11 9 10 9   CREATININE 1.66* 1.62* 1.82* 1.75*  CALCIUM 7.9* 7.8* 8.1* 7.9*  AST 22  --  19  --   ALT 24  --  21  --   ALKPHOS 87  --  76  --   BILITOT 0.8  --  0.7  --   ALBUMIN 2.4*  --  2.3*  --   MG  --  1.8 1.6* 2.0  BNP  --  572.2*  --   --      ------------------------------------------------------------------------------------------------------------------ No results for input(s): "CHOL", "HDL", "LDLCALC", "TRIG", "CHOLHDL", "LDLDIRECT" in the last 72 hours.  Lab Results  Component Value Date   HGBA1C 7.2 (H) 05/14/2022   ------------------------------------------------------------------------------------------------------------------ No results for input(s): "TSH", "T4TOTAL", "T3FREE", "THYROIDAB" in the last 72 hours.  Invalid input(s): "FREET3"  Cardiac Enzymes No  results for input(s): "CKMB", "TROPONINI", "MYOGLOBIN" in the last 168 hours.  Invalid input(s): "CK" ------------------------------------------------------------------------------------------------------------------    Component Value Date/Time   BNP 572.2 (H) 10/17/2022 0736    CBG: Recent Labs  Lab 10/16/22 1850  GLUCAP 125*     No results found for this or any previous visit (from the past 240 hour(s)).   Radiology Studies: No results found.   Marzetta Board, MD, PhD [facility]  Between 7 am - 7 pm I am available, please contact me via Amion (for emergencies) or Securechat (non urgent messages)  Between 7 pm - 7 am I am not available, please contact night coverage MD/APP via Amion

## 2022-10-21 DIAGNOSIS — Z741 Need for assistance with personal care: Secondary | ICD-10-CM | POA: Diagnosis not present

## 2022-10-21 DIAGNOSIS — I5042 Chronic combined systolic (congestive) and diastolic (congestive) heart failure: Secondary | ICD-10-CM | POA: Diagnosis not present

## 2022-10-21 DIAGNOSIS — Z719 Counseling, unspecified: Secondary | ICD-10-CM | POA: Diagnosis not present

## 2022-10-21 DIAGNOSIS — I10 Essential (primary) hypertension: Secondary | ICD-10-CM | POA: Diagnosis not present

## 2022-10-21 DIAGNOSIS — R1311 Dysphagia, oral phase: Secondary | ICD-10-CM | POA: Diagnosis not present

## 2022-10-21 DIAGNOSIS — R41841 Cognitive communication deficit: Secondary | ICD-10-CM | POA: Diagnosis not present

## 2022-10-21 DIAGNOSIS — M6259 Muscle wasting and atrophy, not elsewhere classified, multiple sites: Secondary | ICD-10-CM | POA: Diagnosis not present

## 2022-10-21 DIAGNOSIS — R2681 Unsteadiness on feet: Secondary | ICD-10-CM | POA: Diagnosis not present

## 2022-10-21 DIAGNOSIS — E876 Hypokalemia: Secondary | ICD-10-CM | POA: Diagnosis not present

## 2022-10-21 DIAGNOSIS — I4891 Unspecified atrial fibrillation: Secondary | ICD-10-CM | POA: Diagnosis not present

## 2022-10-21 DIAGNOSIS — R112 Nausea with vomiting, unspecified: Secondary | ICD-10-CM | POA: Diagnosis not present

## 2022-10-21 DIAGNOSIS — M6281 Muscle weakness (generalized): Secondary | ICD-10-CM | POA: Diagnosis not present

## 2022-10-21 DIAGNOSIS — E785 Hyperlipidemia, unspecified: Secondary | ICD-10-CM | POA: Diagnosis not present

## 2022-10-21 DIAGNOSIS — F0150 Vascular dementia without behavioral disturbance: Secondary | ICD-10-CM | POA: Diagnosis not present

## 2022-10-21 DIAGNOSIS — E119 Type 2 diabetes mellitus without complications: Secondary | ICD-10-CM | POA: Diagnosis not present

## 2022-10-21 DIAGNOSIS — J449 Chronic obstructive pulmonary disease, unspecified: Secondary | ICD-10-CM | POA: Diagnosis not present

## 2022-10-21 NOTE — Progress Notes (Signed)
Patient is discharged to [facility]. Attempted to give report to the nurse receiving the patient x2.  Left my ph# with the receptionist and told transportation is here to pick the patient.  Removed peripheral IV.  Patient escorted in the w/c.  Safe transportation was arranged to pick the patient.  AVS explained and patient verbalized understanding.

## 2022-10-21 NOTE — TOC Transition Note (Signed)
Transition of Care [facility]) - CM/SW Discharge Note   Patient Details  Name: Albert Powell MRN: [id number] Date of Birth: 31-Jan-1941  [facility]) CM/SW Contact:  Milinda Antis, [place] Phone Number: 10/21/2022, 2:33 PM   Clinical Narrative:    Patient will DC to: [facility] (SNF) Anticipated DC date: 10/21/2022 Transport by: [facility]     Per MD patient ready for DC to SNF. RN to call report prior to discharge [phone]). RN, patient, and facility notified of DC. Discharge Summary sent to facility.  Safe transport will provide transportation.   CSW will sign off for now as social work intervention is no longer needed. Please consult [place] again if new needs arise.  Final next level of care: Skilled Nursing Facility Barriers to Discharge: Barriers Resolved   Patient Goals and CMS Choice CMS Medicare.gov Compare Post Acute Care list provided to:: Patient Choice offered to / list presented to : Patient, Adult Children  Discharge Placement                Patient chooses bed at: [facility] Patient to be transferred to facility by: [facility] Name of family member notified: daughter [name] Patient and family notified of of transfer: 10/21/22  Discharge Plan and Services Additional resources added to the After Visit Summary for       Post Acute Care Choice: [place]                               Social Determinants of Health (SDOH) Interventions SDOH Screenings   Food Insecurity: No Food Insecurity (10/03/2022)  Housing: Low Risk  (10/03/2022)  Transportation Needs: Unmet Transportation Needs (10/08/2022)  Utilities: Not At Risk (10/03/2022)  Alcohol Screen: Low Risk  (06/03/2022)  Depression (PHQ2-9): High Risk (08/14/2022)  Financial Resource Strain: Low Risk  (06/03/2022)  Tobacco Use: Medium Risk (10/03/2022)     Readmission Risk Interventions     No data to display

## 2022-10-21 NOTE — Progress Notes (Signed)
PT Cancellation Note  Patient Details Name: Albert Powell MRN: [id number] DOB: 09/13/1941   Cancelled Treatment:    Reason Eval/Treat Not Completed: Patient declined, no reason specified  States he does not feel like participating today. Will follow and progress as tolerated.  Ellouise Newer 10/21/2022, 12:42 PM

## 2022-10-21 NOTE — TOC Progression Note (Signed)
Transition of Care [facility]) - Initial/Assessment Note    Patient Details  Name: Albert Powell MRN: [id number] Date of Birth: 1941-09-08  Transition of Care [facility]) CM/SW Contact:    Milinda Antis, LCSWA Phone Number: 10/21/2022, 8:22 AM  Clinical Narrative:                 LCSW sent discharge summary to admissions at [facility] and is awaiting transfer information.    TOC following.   Expected Discharge Plan: Skilled Nursing Facility Barriers to Discharge: Continued Medical Work up, SNF Pending bed offer   Patient Goals and CMS Choice   CMS Medicare.gov Compare Post Acute Care list provided to:: Patient Choice offered to / list presented to : Patient, Adult Children      Expected Discharge Plan and Services     Post Acute Care Choice: [place] Living arrangements for the past 2 months: Apartment Expected Discharge Date: 10/21/22                                    Prior Living Arrangements/Services Living arrangements for the past 2 months: Apartment Lives with:: Spouse          Need for Family Participation in Patient Care: Yes (Comment) Care giver support system in place?: No (comment)   Criminal Activity/Legal Involvement Pertinent to Current Situation/Hospitalization: No - Comment as needed  Activities of Daily Living Home Assistive Devices/Equipment: [place] Rile (specify type) ADL Screening (condition at time of admission) Patient's cognitive ability adequate to safely complete daily activities?: Yes Is the patient deaf or have difficulty hearing?: No Does the patient have difficulty seeing, even when wearing glasses/contacts?: No Does the patient have difficulty concentrating, remembering, or making decisions?: No Patient able to express need for assistance with ADLs?: Yes Does the patient have difficulty dressing or bathing?: Yes Independently performs ADLs?: No Communication: Independent Dressing (OT): Needs assistance Is this a change  from baseline?: Pre-admission baseline Grooming: Independent Feeding: Independent  Permission Sought/Granted Permission sought to share information with : Facility Art therapist granted to share information with : Yes, Verbal Permission Granted              Emotional Assessment       Orientation: : Oriented to Self, Oriented to Place, Oriented to  Time, Oriented to Situation Alcohol / Substance Use: Not Applicable Psych Involvement: No (comment)  Admission diagnosis:  Hypokalemia [E87.6] Weakness [R53.1] Nausea and vomiting, unspecified vomiting type [R11.2] Patient Active Problem List   Diagnosis Date Noted   Generalized weakness 10/17/2022   Dyslipidemia 10/17/2022   Type 2 diabetes mellitus with chronic kidney disease, without long-term current use of insulin ([place]) 10/17/2022   Chronic atrial fibrillation with RVR ([place]) 10/17/2022   Hypothyroidism 10/17/2022   GERD without esophagitis 10/17/2022   Hyponatremia 10/03/2022   Constipation 10/03/2022   Proctitis 10/03/2022   HLD (hyperlipidemia) 10/03/2022   Stage 3b chronic kidney disease (CKD) ([place]) 10/03/2022   GIB (gastrointestinal bleeding) 10/03/2022   Nausea and vomiting 09/18/2022   (HFpEF) heart failure with preserved ejection fraction ([place]) 09/18/2022   AKI (acute kidney injury) ([place]) 09/06/2022   Chronic combined systolic and diastolic CHF (congestive heart failure) ([place]) 09/04/2022   Near syncope 09/04/2022   Hypokalemia 06/23/2022   Elevated troponin 06/23/2022   Type 2 diabetes mellitus with hyperlipidemia ([place]) 05/19/2022   Acute combined systolic and diastolic heart failure ([name])    Acute  kidney injury superimposed on chronic kidney disease ([place]) 05/13/2022   GERD (gastroesophageal reflux disease) 05/13/2022   Acquired hypothyroidism 05/13/2022   Paroxysmal atrial fibrillation ([place]) 40/04/6760   Acute metabolic encephalopathy    CHF (congestive heart failure) ([place]) 03/10/2021    Essential hypertension 03/10/2021   Hypertensive crisis 01/01/2021   ACS (acute coronary syndrome) ([place])    Lesion of right native kidney    Atrial fibrillation ([place]) 03/30/2020   Contusion of right hand 11/12/2018   Primary osteoarthritis of both first carpometacarpal joints 11/12/2018   Pain in joint of right shoulder 10/05/2018   Pain in right hand 10/05/2018   Hematoma 03/30/2018   Degeneration of lumbar intervertebral disc 12/21/2017   Lumbar radiculopathy 12/21/2017   Vertigo 04/09/2017   Hypogonadism in male 12/07/2014   Screening for prostate cancer 12/07/2014   Claudication of right lower extremity ([place]) 10/20/2013   Claudication in peripheral vascular disease- Rt leg 09/20/2013   Obesity (BMI 30-39.9)- negative sleep study in the past 09/20/2013   Occlusion and stenosis of carotid artery without mention of cerebral infarction 05/23/2013   PAD (peripheral artery disease) ([place]) 05/18/2013   Carotid artery disease ([place]) 04/14/2013   Bradycardia, sinus 02/18/2013   Tobacco abuse 02/05/2013   Chest pain, unstable angina, negative MI 02/04/2013   Coronary artery disease involving native coronary artery of native heart without angina pectoris 02/04/2013   Benign essential hypertension 07/10/2011   Incisional hernia 05/22/2011   PCP:  Patient, No Pcp Per Pharmacy:   [facility] - [place], [place] - [address] [place] [address] Phone: [phone] Fax: [phone]     Social Determinants of Health (SDOH) Social History: [place]: No Food Insecurity (10/03/2022)  Housing: Low Risk  (10/03/2022)  Transportation Needs: Unmet Transportation Needs (10/08/2022)  Utilities: Not At Risk (10/03/2022)  Alcohol Screen: Low Risk  (06/03/2022)  Depression (PHQ2-9): High Risk (08/14/2022)  Financial Resource Strain: Low Risk  (06/03/2022)  Tobacco Use: Medium Risk (10/03/2022)   SDOH  Interventions:     Readmission Risk Interventions     No data to display

## 2022-10-22 DIAGNOSIS — I10 Essential (primary) hypertension: Secondary | ICD-10-CM | POA: Diagnosis not present

## 2022-10-22 DIAGNOSIS — M6281 Muscle weakness (generalized): Secondary | ICD-10-CM | POA: Diagnosis not present

## 2022-10-22 DIAGNOSIS — Z741 Need for assistance with personal care: Secondary | ICD-10-CM | POA: Diagnosis not present

## 2022-10-22 DIAGNOSIS — I4891 Unspecified atrial fibrillation: Secondary | ICD-10-CM | POA: Diagnosis not present

## 2022-10-22 DIAGNOSIS — R2681 Unsteadiness on feet: Secondary | ICD-10-CM | POA: Diagnosis not present

## 2022-10-22 DIAGNOSIS — I5042 Chronic combined systolic (congestive) and diastolic (congestive) heart failure: Secondary | ICD-10-CM | POA: Diagnosis not present

## 2022-10-22 DIAGNOSIS — R1311 Dysphagia, oral phase: Secondary | ICD-10-CM | POA: Diagnosis not present

## 2022-10-22 DIAGNOSIS — R41841 Cognitive communication deficit: Secondary | ICD-10-CM | POA: Diagnosis not present

## 2022-10-23 ENCOUNTER — Other Ambulatory Visit: Payer: Self-pay | Admitting: *Deleted

## 2022-10-23 ENCOUNTER — Encounter (HOSPITAL_COMMUNITY): Payer: Self-pay | Admitting: *Deleted

## 2022-10-23 NOTE — Patient Outreach (Addendum)
Albert Powell resides in [facility] skilled nursing facility. Screening for potential [facility] care coordination services as benefit of health plan and Primary Care Provider.  Facility site visit to [facility] skilled nursing facility. Spoke with Albert Powell at bedside. Albert Powell reports he lives with spouse. States spouse recently diagnosed with dementia. States his daughter Albert Powell is primary contact now.   Albert Powell states he does not have a PCP. States he is unsure as to why he was dropped from the practice. Discussed writer will continue to follow for transition plans and needs.   [facility] Care Management brochure and contact information at bedside.   Will collaborate with Scientist, research (life sciences). Will plan outreach to daughter Albert Powell as appropriate.    Marthenia Rolling, MSN, RN,BSN [place] Acute Care Coordinator [phone] (Direct dial)

## 2022-10-24 DIAGNOSIS — M6281 Muscle weakness (generalized): Secondary | ICD-10-CM | POA: Diagnosis not present

## 2022-10-24 DIAGNOSIS — M6259 Muscle wasting and atrophy, not elsewhere classified, multiple sites: Secondary | ICD-10-CM | POA: Diagnosis not present

## 2022-10-24 DIAGNOSIS — Z741 Need for assistance with personal care: Secondary | ICD-10-CM | POA: Diagnosis not present

## 2022-10-24 DIAGNOSIS — R1311 Dysphagia, oral phase: Secondary | ICD-10-CM | POA: Diagnosis not present

## 2022-10-27 ENCOUNTER — Other Ambulatory Visit (HOSPITAL_COMMUNITY): Payer: Self-pay | Admitting: Cardiology

## 2022-10-27 DIAGNOSIS — I4891 Unspecified atrial fibrillation: Secondary | ICD-10-CM | POA: Diagnosis not present

## 2022-10-27 DIAGNOSIS — I5042 Chronic combined systolic (congestive) and diastolic (congestive) heart failure: Secondary | ICD-10-CM | POA: Diagnosis not present

## 2022-10-27 DIAGNOSIS — Z741 Need for assistance with personal care: Secondary | ICD-10-CM | POA: Diagnosis not present

## 2022-10-27 DIAGNOSIS — E876 Hypokalemia: Secondary | ICD-10-CM | POA: Diagnosis not present

## 2022-10-29 DIAGNOSIS — R2681 Unsteadiness on feet: Secondary | ICD-10-CM | POA: Diagnosis not present

## 2022-10-29 DIAGNOSIS — I5042 Chronic combined systolic (congestive) and diastolic (congestive) heart failure: Secondary | ICD-10-CM | POA: Diagnosis not present

## 2022-10-29 DIAGNOSIS — I4891 Unspecified atrial fibrillation: Secondary | ICD-10-CM | POA: Diagnosis not present

## 2022-10-29 DIAGNOSIS — J449 Chronic obstructive pulmonary disease, unspecified: Secondary | ICD-10-CM | POA: Diagnosis not present

## 2022-10-29 DIAGNOSIS — M6281 Muscle weakness (generalized): Secondary | ICD-10-CM | POA: Diagnosis not present

## 2022-10-30 ENCOUNTER — Other Ambulatory Visit: Payer: Self-pay | Admitting: *Deleted

## 2022-10-30 DIAGNOSIS — E876 Hypokalemia: Secondary | ICD-10-CM | POA: Diagnosis not present

## 2022-10-30 DIAGNOSIS — I5042 Chronic combined systolic (congestive) and diastolic (congestive) heart failure: Secondary | ICD-10-CM | POA: Diagnosis not present

## 2022-10-30 DIAGNOSIS — I4891 Unspecified atrial fibrillation: Secondary | ICD-10-CM | POA: Diagnosis not present

## 2022-10-30 NOTE — Patient Outreach (Signed)
[facility] Post- Acute Care Coordinator follow up. Mr. Beldin resides in [facility] skilled nursing facility. Screening for [facility] care coordination services as benefit of health plan.  Collaboration with Cindie Crumbly social worker. Bubba Hales confirms Mr. Gerrior does not have a PCP. Bubba Hales reports recent care plan meeting involved daughter [name]. Anticipated transition plan is for long term care.   Will continue to follow.   Marthenia Rolling, MSN, RN,BSN [place] Acute Care Coordinator [phone] (Direct dial)

## 2022-11-03 DIAGNOSIS — E876 Hypokalemia: Secondary | ICD-10-CM | POA: Diagnosis not present

## 2022-11-03 DIAGNOSIS — M6281 Muscle weakness (generalized): Secondary | ICD-10-CM | POA: Diagnosis not present

## 2022-11-03 DIAGNOSIS — R2681 Unsteadiness on feet: Secondary | ICD-10-CM | POA: Diagnosis not present

## 2022-11-05 ENCOUNTER — Other Ambulatory Visit: Payer: Self-pay | Admitting: *Deleted

## 2022-11-05 DIAGNOSIS — Z741 Need for assistance with personal care: Secondary | ICD-10-CM | POA: Diagnosis not present

## 2022-11-05 DIAGNOSIS — J449 Chronic obstructive pulmonary disease, unspecified: Secondary | ICD-10-CM | POA: Diagnosis not present

## 2022-11-05 DIAGNOSIS — I4891 Unspecified atrial fibrillation: Secondary | ICD-10-CM | POA: Diagnosis not present

## 2022-11-05 DIAGNOSIS — M6281 Muscle weakness (generalized): Secondary | ICD-10-CM | POA: Diagnosis not present

## 2022-11-05 DIAGNOSIS — I5042 Chronic combined systolic (congestive) and diastolic (congestive) heart failure: Secondary | ICD-10-CM | POA: Diagnosis not present

## 2022-11-05 DIAGNOSIS — R2681 Unsteadiness on feet: Secondary | ICD-10-CM | POA: Diagnosis not present

## 2022-11-05 DIAGNOSIS — E785 Hyperlipidemia, unspecified: Secondary | ICD-10-CM | POA: Diagnosis not present

## 2022-11-05 DIAGNOSIS — R1311 Dysphagia, oral phase: Secondary | ICD-10-CM | POA: Diagnosis not present

## 2022-11-05 DIAGNOSIS — I10 Essential (primary) hypertension: Secondary | ICD-10-CM | POA: Diagnosis not present

## 2022-11-05 NOTE — Patient Outreach (Signed)
Per [facility] Mr. Kulikowski resides in [facility]F. Screening for potential [facility] care coordination services as benefit of health plan.   Secure communication sent to [place] work team to request update on transition plans.   Will continue to follow.   Marthenia Rolling, MSN, RN,BSN [place] Acute Care Coordinator [phone] (Direct dial)

## 2022-11-07 ENCOUNTER — Ambulatory Visit (HOSPITAL_COMMUNITY)
Admission: RE | Admit: 2022-11-07 | Payer: Medicare Other | Source: Ambulatory Visit | Attending: Cardiovascular Disease | Admitting: Cardiovascular Disease

## 2022-11-07 DIAGNOSIS — R1311 Dysphagia, oral phase: Secondary | ICD-10-CM | POA: Diagnosis not present

## 2022-11-07 DIAGNOSIS — Z741 Need for assistance with personal care: Secondary | ICD-10-CM | POA: Diagnosis not present

## 2022-11-07 DIAGNOSIS — I10 Essential (primary) hypertension: Secondary | ICD-10-CM | POA: Diagnosis not present

## 2022-11-07 DIAGNOSIS — E876 Hypokalemia: Secondary | ICD-10-CM | POA: Diagnosis not present

## 2022-11-10 DIAGNOSIS — E876 Hypokalemia: Secondary | ICD-10-CM | POA: Diagnosis not present

## 2022-11-10 DIAGNOSIS — Z741 Need for assistance with personal care: Secondary | ICD-10-CM | POA: Diagnosis not present

## 2022-11-10 DIAGNOSIS — I10 Essential (primary) hypertension: Secondary | ICD-10-CM | POA: Diagnosis not present

## 2022-11-11 ENCOUNTER — Other Ambulatory Visit (HOSPITAL_COMMUNITY): Payer: Self-pay | Admitting: Cardiology

## 2022-11-12 DIAGNOSIS — M6281 Muscle weakness (generalized): Secondary | ICD-10-CM | POA: Diagnosis not present

## 2022-11-12 DIAGNOSIS — R2681 Unsteadiness on feet: Secondary | ICD-10-CM | POA: Diagnosis not present

## 2022-11-12 DIAGNOSIS — I5042 Chronic combined systolic (congestive) and diastolic (congestive) heart failure: Secondary | ICD-10-CM | POA: Diagnosis not present

## 2022-11-12 DIAGNOSIS — J449 Chronic obstructive pulmonary disease, unspecified: Secondary | ICD-10-CM | POA: Diagnosis not present

## 2022-11-12 DIAGNOSIS — I4891 Unspecified atrial fibrillation: Secondary | ICD-10-CM | POA: Diagnosis not present

## 2022-11-12 DIAGNOSIS — R112 Nausea with vomiting, unspecified: Secondary | ICD-10-CM | POA: Diagnosis not present

## 2022-11-13 DIAGNOSIS — Z741 Need for assistance with personal care: Secondary | ICD-10-CM | POA: Diagnosis not present

## 2022-11-13 DIAGNOSIS — R112 Nausea with vomiting, unspecified: Secondary | ICD-10-CM | POA: Diagnosis not present

## 2022-11-13 DIAGNOSIS — E876 Hypokalemia: Secondary | ICD-10-CM | POA: Diagnosis not present

## 2022-11-14 ENCOUNTER — Other Ambulatory Visit: Payer: Self-pay | Admitting: *Deleted

## 2022-11-14 NOTE — Patient Outreach (Signed)
[place] Coordinator follow up. [name] resides in [facility]. Screening for potential [facility] care coordination services as benefit of health plan and PCP.   Update received from [place], [place] Education officer, museum. Mr. [place] will transition in LTC.   No identifiable [facility] care coordination services since [name] will remain for long term care.    Marthenia Rolling, MSN, RN,BSN [place] Acute Care Coordinator [phone] (Direct dial)

## 2022-11-18 ENCOUNTER — Ambulatory Visit (HOSPITAL_COMMUNITY)
Admission: RE | Admit: 2022-11-18 | Payer: Medicare Other | Source: Ambulatory Visit | Attending: Cardiovascular Disease | Admitting: Cardiovascular Disease

## 2022-11-19 ENCOUNTER — Encounter (HOSPITAL_COMMUNITY): Payer: Self-pay

## 2022-11-19 ENCOUNTER — Other Ambulatory Visit: Payer: Self-pay | Admitting: *Deleted

## 2022-11-19 DIAGNOSIS — I10 Essential (primary) hypertension: Secondary | ICD-10-CM | POA: Diagnosis not present

## 2022-11-19 NOTE — Patient Outreach (Signed)
[place] Coordinator follow up. Per [facility] Mr. Bayuk has transitioned to long term care. Medicaid pending.  No identifiable [facility] care coordination needs at this time.   Marthenia Rolling, MSN, RN,BSN [place] Acute Care Coordinator [phone] (Direct dial)

## 2022-11-20 DIAGNOSIS — E871 Hypo-osmolality and hyponatremia: Secondary | ICD-10-CM | POA: Diagnosis not present

## 2022-11-21 DIAGNOSIS — R0989 Other specified symptoms and signs involving the circulatory and respiratory systems: Secondary | ICD-10-CM | POA: Diagnosis not present

## 2022-11-21 DIAGNOSIS — R062 Wheezing: Secondary | ICD-10-CM | POA: Diagnosis not present

## 2022-11-23 ENCOUNTER — Inpatient Hospital Stay (HOSPITAL_COMMUNITY)
Admission: EM | Admit: 2022-11-23 | Discharge: 2022-11-26 | DRG: 637 | Disposition: A | Discharge status: Hospice | Payer: Medicare Other | Attending: Internal Medicine | Admitting: Internal Medicine

## 2022-11-23 ENCOUNTER — Encounter (HOSPITAL_COMMUNITY): Payer: Self-pay | Admitting: *Deleted

## 2022-11-23 ENCOUNTER — Emergency Department (HOSPITAL_COMMUNITY): Payer: Medicare Other

## 2022-11-23 ENCOUNTER — Inpatient Hospital Stay (HOSPITAL_COMMUNITY): Payer: Medicare Other

## 2022-11-23 ENCOUNTER — Other Ambulatory Visit: Payer: Self-pay

## 2022-11-23 DIAGNOSIS — Z823 Family history of stroke: Secondary | ICD-10-CM

## 2022-11-23 DIAGNOSIS — I4891 Unspecified atrial fibrillation: Secondary | ICD-10-CM | POA: Diagnosis not present

## 2022-11-23 DIAGNOSIS — E440 Moderate protein-calorie malnutrition: Secondary | ICD-10-CM | POA: Diagnosis present

## 2022-11-23 DIAGNOSIS — I480 Paroxysmal atrial fibrillation: Secondary | ICD-10-CM | POA: Diagnosis present

## 2022-11-23 DIAGNOSIS — K224 Dyskinesia of esophagus: Secondary | ICD-10-CM | POA: Diagnosis not present

## 2022-11-23 DIAGNOSIS — R638 Other symptoms and signs concerning food and fluid intake: Secondary | ICD-10-CM

## 2022-11-23 DIAGNOSIS — Z1152 Encounter for screening for COVID-19: Secondary | ICD-10-CM

## 2022-11-23 DIAGNOSIS — I5032 Chronic diastolic (congestive) heart failure: Secondary | ICD-10-CM | POA: Diagnosis present

## 2022-11-23 DIAGNOSIS — G9340 Encephalopathy, unspecified: Secondary | ICD-10-CM

## 2022-11-23 DIAGNOSIS — Z8249 Family history of ischemic heart disease and other diseases of the circulatory system: Secondary | ICD-10-CM

## 2022-11-23 DIAGNOSIS — R911 Solitary pulmonary nodule: Secondary | ICD-10-CM | POA: Diagnosis present

## 2022-11-23 DIAGNOSIS — Z79890 Hormone replacement therapy (postmenopausal): Secondary | ICD-10-CM

## 2022-11-23 DIAGNOSIS — E1122 Type 2 diabetes mellitus with diabetic chronic kidney disease: Secondary | ICD-10-CM | POA: Diagnosis not present

## 2022-11-23 DIAGNOSIS — R4182 Altered mental status, unspecified: Secondary | ICD-10-CM | POA: Diagnosis not present

## 2022-11-23 DIAGNOSIS — E11649 Type 2 diabetes mellitus with hypoglycemia without coma: Secondary | ICD-10-CM | POA: Diagnosis not present

## 2022-11-23 DIAGNOSIS — I130 Hypertensive heart and chronic kidney disease with heart failure and stage 1 through stage 4 chronic kidney disease, or unspecified chronic kidney disease: Secondary | ICD-10-CM | POA: Diagnosis present

## 2022-11-23 DIAGNOSIS — E039 Hypothyroidism, unspecified: Secondary | ICD-10-CM | POA: Diagnosis not present

## 2022-11-23 DIAGNOSIS — I2510 Atherosclerotic heart disease of native coronary artery without angina pectoris: Secondary | ICD-10-CM | POA: Diagnosis not present

## 2022-11-23 DIAGNOSIS — R456 Violent behavior: Secondary | ICD-10-CM | POA: Diagnosis not present

## 2022-11-23 DIAGNOSIS — Z66 Do not resuscitate: Secondary | ICD-10-CM | POA: Diagnosis present

## 2022-11-23 DIAGNOSIS — I10 Essential (primary) hypertension: Secondary | ICD-10-CM | POA: Diagnosis not present

## 2022-11-23 DIAGNOSIS — Z955 Presence of coronary angioplasty implant and graft: Secondary | ICD-10-CM

## 2022-11-23 DIAGNOSIS — E871 Hypo-osmolality and hyponatremia: Secondary | ICD-10-CM | POA: Diagnosis present

## 2022-11-23 DIAGNOSIS — Z515 Encounter for palliative care: Secondary | ICD-10-CM

## 2022-11-23 DIAGNOSIS — E161 Other hypoglycemia: Secondary | ICD-10-CM | POA: Diagnosis not present

## 2022-11-23 DIAGNOSIS — Z833 Family history of diabetes mellitus: Secondary | ICD-10-CM

## 2022-11-23 DIAGNOSIS — J449 Chronic obstructive pulmonary disease, unspecified: Secondary | ICD-10-CM | POA: Diagnosis not present

## 2022-11-23 DIAGNOSIS — R109 Unspecified abdominal pain: Secondary | ICD-10-CM | POA: Diagnosis not present

## 2022-11-23 DIAGNOSIS — E785 Hyperlipidemia, unspecified: Secondary | ICD-10-CM | POA: Diagnosis not present

## 2022-11-23 DIAGNOSIS — Z7189 Other specified counseling: Secondary | ICD-10-CM | POA: Diagnosis not present

## 2022-11-23 DIAGNOSIS — Z825 Family history of asthma and other chronic lower respiratory diseases: Secondary | ICD-10-CM

## 2022-11-23 DIAGNOSIS — G9341 Metabolic encephalopathy: Secondary | ICD-10-CM | POA: Diagnosis present

## 2022-11-23 DIAGNOSIS — Z79899 Other long term (current) drug therapy: Secondary | ICD-10-CM | POA: Diagnosis not present

## 2022-11-23 DIAGNOSIS — I779 Disorder of arteries and arterioles, unspecified: Secondary | ICD-10-CM | POA: Diagnosis present

## 2022-11-23 DIAGNOSIS — Z83438 Family history of other disorder of lipoprotein metabolism and other lipidemia: Secondary | ICD-10-CM

## 2022-11-23 DIAGNOSIS — R54 Age-related physical debility: Secondary | ICD-10-CM | POA: Diagnosis present

## 2022-11-23 DIAGNOSIS — E8729 Other acidosis: Secondary | ICD-10-CM | POA: Diagnosis present

## 2022-11-23 DIAGNOSIS — R000 Tachycardia, unspecified: Secondary | ICD-10-CM | POA: Diagnosis not present

## 2022-11-23 DIAGNOSIS — E162 Hypoglycemia, unspecified: Secondary | ICD-10-CM | POA: Diagnosis not present

## 2022-11-23 DIAGNOSIS — Z7902 Long term (current) use of antithrombotics/antiplatelets: Secondary | ICD-10-CM

## 2022-11-23 DIAGNOSIS — K219 Gastro-esophageal reflux disease without esophagitis: Secondary | ICD-10-CM | POA: Diagnosis not present

## 2022-11-23 DIAGNOSIS — E1151 Type 2 diabetes mellitus with diabetic peripheral angiopathy without gangrene: Secondary | ICD-10-CM | POA: Diagnosis present

## 2022-11-23 DIAGNOSIS — Z87891 Personal history of nicotine dependence: Secondary | ICD-10-CM

## 2022-11-23 DIAGNOSIS — R531 Weakness: Secondary | ICD-10-CM | POA: Diagnosis not present

## 2022-11-23 DIAGNOSIS — E278 Other specified disorders of adrenal gland: Secondary | ICD-10-CM | POA: Diagnosis not present

## 2022-11-23 DIAGNOSIS — N1832 Chronic kidney disease, stage 3b: Secondary | ICD-10-CM | POA: Diagnosis present

## 2022-11-23 DIAGNOSIS — Z7984 Long term (current) use of oral hypoglycemic drugs: Secondary | ICD-10-CM

## 2022-11-23 DIAGNOSIS — R1314 Dysphagia, pharyngoesophageal phase: Secondary | ICD-10-CM | POA: Diagnosis not present

## 2022-11-23 DIAGNOSIS — F0150 Vascular dementia without behavioral disturbance: Secondary | ICD-10-CM | POA: Diagnosis present

## 2022-11-23 DIAGNOSIS — I129 Hypertensive chronic kidney disease with stage 1 through stage 4 chronic kidney disease, or unspecified chronic kidney disease: Secondary | ICD-10-CM | POA: Diagnosis not present

## 2022-11-23 DIAGNOSIS — I69818 Other symptoms and signs involving cognitive functions following other cerebrovascular disease: Secondary | ICD-10-CM | POA: Diagnosis not present

## 2022-11-23 DIAGNOSIS — R1310 Dysphagia, unspecified: Secondary | ICD-10-CM | POA: Diagnosis not present

## 2022-11-23 DIAGNOSIS — I739 Peripheral vascular disease, unspecified: Secondary | ICD-10-CM | POA: Diagnosis present

## 2022-11-23 DIAGNOSIS — N289 Disorder of kidney and ureter, unspecified: Secondary | ICD-10-CM | POA: Diagnosis not present

## 2022-11-23 DIAGNOSIS — Z7401 Bed confinement status: Secondary | ICD-10-CM | POA: Diagnosis not present

## 2022-11-23 DIAGNOSIS — Z7901 Long term (current) use of anticoagulants: Secondary | ICD-10-CM

## 2022-11-23 LAB — CBC WITH DIFFERENTIAL/PLATELET
Abs Immature Granulocytes: 0.15 10*3/uL — ABNORMAL HIGH (ref 0.00–0.07)
Basophils Absolute: 0 10*3/uL (ref 0.0–0.1)
Basophils Relative: 0 %
Eosinophils Absolute: 0.1 10*3/uL (ref 0.0–0.5)
Eosinophils Relative: 1 %
HCT: 45 % (ref 39.0–52.0)
Hemoglobin: 15.5 g/dL (ref 13.0–17.0)
Immature Granulocytes: 1 %
Lymphocytes Relative: 7 %
Lymphs Abs: 0.7 10*3/uL (ref 0.7–4.0)
MCH: 31.3 pg (ref 26.0–34.0)
MCHC: 34.4 g/dL (ref 30.0–36.0)
MCV: 90.9 fL (ref 80.0–100.0)
Monocytes Absolute: 0.7 10*3/uL (ref 0.1–1.0)
Monocytes Relative: 7 %
Neutro Abs: 8.8 10*3/uL — ABNORMAL HIGH (ref 1.7–7.7)
Neutrophils Relative %: 84 %
Platelets: 254 10*3/uL (ref 150–400)
RBC: 4.95 MIL/uL (ref 4.22–5.81)
RDW: 16.1 % — ABNORMAL HIGH (ref 11.5–15.5)
WBC: 10.4 10*3/uL (ref 4.0–10.5)
nRBC: 0 % (ref 0.0–0.2)

## 2022-11-23 LAB — I-STAT CHEM 8, ED
BUN: 10 mg/dL (ref 8–23)
Calcium, Ion: 0.8 mmol/L — CL (ref 1.15–1.40)
Chloride: 108 mmol/L (ref 98–111)
Creatinine, Ser: 1 mg/dL (ref 0.61–1.24)
Glucose, Bld: 102 mg/dL — ABNORMAL HIGH (ref 70–99)
HCT: 36 % — ABNORMAL LOW (ref 39.0–52.0)
Hemoglobin: 12.2 g/dL — ABNORMAL LOW (ref 13.0–17.0)
Potassium: 4 mmol/L (ref 3.5–5.1)
Sodium: 133 mmol/L — ABNORMAL LOW (ref 135–145)
TCO2: 16 mmol/L — ABNORMAL LOW (ref 22–32)

## 2022-11-23 LAB — URINALYSIS, ROUTINE W REFLEX MICROSCOPIC
Bacteria, UA: NONE SEEN
Bilirubin Urine: NEGATIVE
Glucose, UA: 150 mg/dL — AB
Hgb urine dipstick: NEGATIVE
Ketones, ur: NEGATIVE mg/dL
Leukocytes,Ua: NEGATIVE
Nitrite: NEGATIVE
Protein, ur: >=300 mg/dL — AB
Specific Gravity, Urine: 1.009 (ref 1.005–1.030)
pH: 7 (ref 5.0–8.0)

## 2022-11-23 LAB — CBG MONITORING, ED
Glucose-Capillary: 39 mg/dL — CL (ref 70–99)
Glucose-Capillary: 99 mg/dL (ref 70–99)
Glucose-Capillary: 55 mg/dL — ABNORMAL LOW (ref 70–99)
Glucose-Capillary: 108 mg/dL — ABNORMAL HIGH (ref 70–99)
Glucose-Capillary: 58 mg/dL — ABNORMAL LOW (ref 70–99)
Glucose-Capillary: 69 mg/dL — ABNORMAL LOW (ref 70–99)

## 2022-11-23 LAB — COMPREHENSIVE METABOLIC PANEL
ALT: 22 U/L (ref 0–44)
AST: 37 U/L (ref 15–41)
Albumin: 2.9 g/dL — ABNORMAL LOW (ref 3.5–5.0)
Alkaline Phosphatase: 112 U/L (ref 38–126)
Anion gap: 15 (ref 5–15)
BUN: 11 mg/dL (ref 8–23)
CO2: 14 mmol/L — ABNORMAL LOW (ref 22–32)
Calcium: 8.8 mg/dL — ABNORMAL LOW (ref 8.9–10.3)
Chloride: 101 mmol/L (ref 98–111)
Creatinine, Ser: 1.52 mg/dL — ABNORMAL HIGH (ref 0.61–1.24)
GFR, Estimated: 46 mL/min — ABNORMAL LOW (ref >60)
Glucose, Bld: 54 mg/dL — ABNORMAL LOW (ref 70–99)
Potassium: 4.8 mmol/L (ref 3.5–5.1)
Sodium: 130 mmol/L — ABNORMAL LOW (ref 135–145)
Total Bilirubin: 0.8 mg/dL (ref 0.3–1.2)
Total Protein: 5.7 g/dL — ABNORMAL LOW (ref 6.5–8.1)

## 2022-11-23 LAB — BASIC METABOLIC PANEL
Anion gap: 17 — ABNORMAL HIGH (ref 5–15)
BUN: 11 mg/dL (ref 8–23)
CO2: 14 mmol/L — ABNORMAL LOW (ref 22–32)
Calcium: 8.5 mg/dL — ABNORMAL LOW (ref 8.9–10.3)
Chloride: 101 mmol/L (ref 98–111)
Creatinine, Ser: 1.45 mg/dL — ABNORMAL HIGH (ref 0.61–1.24)
GFR, Estimated: 49 mL/min — ABNORMAL LOW (ref >60)
Glucose, Bld: 51 mg/dL — ABNORMAL LOW (ref 70–99)
Potassium: 4.3 mmol/L (ref 3.5–5.1)
Sodium: 132 mmol/L — ABNORMAL LOW (ref 135–145)

## 2022-11-23 LAB — RESP PANEL BY RT-PCR (RSV, FLU A&B, COVID)  RVPGX2
Influenza A by PCR: NEGATIVE
Influenza B by PCR: NEGATIVE
Resp Syncytial Virus by PCR: NEGATIVE
SARS Coronavirus 2 by RT PCR: NEGATIVE

## 2022-11-23 LAB — SODIUM, URINE, RANDOM: Sodium, Ur: <10 mmol/L

## 2022-11-23 LAB — TSH: TSH: 7.508 u[IU]/mL — ABNORMAL HIGH (ref 0.350–4.500)

## 2022-11-23 LAB — GLUCOSE, CAPILLARY
Glucose-Capillary: 83 mg/dL (ref 70–99)
Glucose-Capillary: 90 mg/dL (ref 70–99)
Glucose-Capillary: 100 mg/dL — ABNORMAL HIGH (ref 70–99)

## 2022-11-23 LAB — OSMOLALITY, URINE: Osmolality, Ur: 236 mOsm/kg — ABNORMAL LOW (ref 300–900)

## 2022-11-23 LAB — LACTIC ACID, PLASMA: Lactic Acid, Venous: 1.9 mmol/L (ref 0.5–1.9)

## 2022-11-23 LAB — OSMOLALITY: Osmolality: 272 mOsm/kg — ABNORMAL LOW (ref 275–295)

## 2022-11-23 LAB — MAGNESIUM: Magnesium: 1.8 mg/dL (ref 1.7–2.4)

## 2022-11-23 LAB — PHOSPHORUS: Phosphorus: 2 mg/dL — ABNORMAL LOW (ref 2.5–4.6)

## 2022-11-23 MED ORDER — LEVOTHYROXINE SODIUM 75 MCG PO TABS: 75.0000 ug | ORAL_TABLET | Freq: Every day | ORAL | Status: DC

## 2022-11-23 MED ORDER — POTASSIUM CHLORIDE CRYS ER 20 MEQ PO TBCR: 40.0000 meq | EXTENDED_RELEASE_TABLET | Freq: Two times a day (BID) | ORAL | Status: DC

## 2022-11-23 MED ORDER — APIXABAN 2.5 MG PO TABS: 2.5000 mg | ORAL_TABLET | Freq: Two times a day (BID) | ORAL | Status: DC

## 2022-11-23 MED ORDER — PANTOPRAZOLE SODIUM 40 MG PO TBEC: 40.0000 mg | DELAYED_RELEASE_TABLET | ORAL | Status: DC

## 2022-11-23 MED ORDER — SODIUM PHOSPHATES 45 MMOLE/15ML IV SOLN
15.0000 mmol | Freq: Once | INTRAVENOUS | Status: AC
  Administered 2022-11-23: 15 mmol via INTRAVENOUS
  Filled 2022-11-23: qty 5

## 2022-11-23 MED ORDER — HYDRALAZINE HCL 100 MG PO TABS: 100.0000 mg | ORAL_TABLET | Freq: Three times a day (TID) | ORAL | Status: DC

## 2022-11-23 MED ORDER — DEXTROSE 50 % IV SOLN
1.0000 | Freq: Once | INTRAVENOUS | Status: AC
  Administered 2022-11-23: 50 mL via INTRAVENOUS
  Filled 2022-11-23: qty 50

## 2022-11-23 MED ORDER — CLOPIDOGREL BISULFATE 75 MG PO TABS: 75.0000 mg | ORAL_TABLET | Freq: Every day | ORAL | Status: DC

## 2022-11-23 MED ORDER — DEXTROSE-NACL 5-0.45 % IV SOLN: INTRAVENOUS | Status: DC

## 2022-11-23 MED ORDER — ENOXAPARIN SODIUM 80 MG/0.8ML IJ SOSY
80.0000 mg | PREFILLED_SYRINGE | Freq: Two times a day (BID) | INTRAMUSCULAR | Status: DC
  Administered 2022-11-23 – 2022-11-25 (×4): 80 mg via SUBCUTANEOUS
  Filled 2022-11-23 (×5): qty 0.8

## 2022-11-23 MED ORDER — ATORVASTATIN CALCIUM 40 MG PO TABS: 40.0000 mg | ORAL_TABLET | Freq: Every day | ORAL | Status: DC

## 2022-11-23 MED ORDER — PANTOPRAZOLE SODIUM 40 MG IV SOLR
40.0000 mg | Freq: Every day | INTRAVENOUS | Status: DC
  Administered 2022-11-23 – 2022-11-24 (×2): 40 mg via INTRAVENOUS
  Filled 2022-11-23 (×2): qty 10

## 2022-11-23 MED ORDER — AMLODIPINE BESYLATE 5 MG PO TABS: 10.0000 mg | ORAL_TABLET | Freq: Every day | ORAL | Status: DC

## 2022-11-23 MED ORDER — DEXTROSE-NACL 5-0.45 % IV SOLN: INTRAVENOUS | Status: AC

## 2022-11-23 MED ORDER — AMIODARONE HCL 200 MG PO TABS: 200.0000 mg | ORAL_TABLET | Freq: Two times a day (BID) | ORAL | Status: DC

## 2022-11-23 MED ORDER — ISOSORBIDE MONONITRATE ER 30 MG PO TB24: 120.0000 mg | ORAL_TABLET | Freq: Every day | ORAL | Status: DC

## 2022-11-23 NOTE — ED Notes (Signed)
IV team at bedside 

## 2022-11-23 NOTE — ED Provider Notes (Signed)
Albert Powell   CSN: [id number] Arrival date & time: 11/23/22  [id number]     History  Chief Complaint  Patient presents with   Hypoglycemia    Albert Powell is a 82 y.o. male.  82 year old male with prior medical history as detailed below presents for evaluation.  Patient presents from [facility].  Staff there thought that the patient was confused and altered.  EMS reports that initial blood sugar was 37.  1 mg of glucagon given IM during transport.  Patient with persistent hypoglycemia despite glucagon.  With improvement in glucose patient's mental status improved.    The history is provided by the patient and medical records.       Home Medications Prior to Admission medications   Medication Sig Start Date End Date Taking? Authorizing Provider  amiodarone (PACERONE) 200 MG tablet TAKE ONE TABLET BY MOUTH TWICE DAILY 10/27/22   Larey Dresser, MD  amLODipine (NORVASC) 10 MG tablet Take 1 tablet (10 mg total) by mouth daily. 08/05/22 08/05/23  Larey Dresser, MD  atorvastatin (LIPITOR) 40 MG tablet Take 1 tablet (40 mg total) by mouth daily. 08/15/22   Larey Dresser, MD  clopidogrel (PLAVIX) 75 MG tablet Take 1 tablet (75 mg total) by mouth daily. 08/15/22   Larey Dresser, MD  ELIQUIS 2.5 MG TABS tablet TAKE ONE TABLET BY MOUTH TWICE DAILY 10/27/22   Larey Dresser, MD  glipiZIDE (GLUCOTROL) 5 MG tablet Take 5 mg by mouth daily before breakfast.    [provider]  hydrALAZINE (APRESOLINE) 100 MG tablet Take 1 tablet (100 mg total) by mouth 3 (three) times daily. 09/25/22 10/25/22  Regalado, Jerald Kief A, MD  isosorbide mononitrate (IMDUR) 120 MG 24 hr tablet Take 1 tablet (120 mg total) by mouth daily. 08/15/22   Larey Dresser, MD  levothyroxine (SYNTHROID) 25 MCG tablet TAKE ONE TABLET BY MOUTH DAILY BEFORE BREAKFAST Patient taking differently: Take 75 mcg by mouth daily before breakfast. 03/13/22    Lorretta Harp, MD  Multiple Vitamin (MULTIVITAMIN WITH MINERALS) TABS tablet Take 1 tablet by mouth daily. 09/25/22   Regalado, Belkys A, MD  nystatin (MYCOSTATIN/NYSTOP) powder Apply topically 2 (two) times daily. 09/25/22   Regalado, Belkys A, MD  ondansetron (ZOFRAN-ODT) 4 MG disintegrating tablet Take 1 tablet (4 mg total) by mouth every 8 (eight) hours as needed for up to 15 doses for nausea or vomiting. 08/28/22   Wyvonnia Dusky, MD  pantoprazole (PROTONIX) 40 MG tablet Take 1 tablet (40 mg total) by mouth every other day. 07/30/22   Lyda Jester M, PA-C  potassium chloride SA (KLOR-CON M) 20 MEQ tablet Take 2 tablets (40 mEq total) by mouth 2 (two) times daily. 10/06/22   Nita Sells, MD      Allergies    Fentanyl, Gabapentin, Lisinopril, Lyrica [pregabalin], Metformin, and Propofol    Review of Systems   Review of Systems  All other systems reviewed and are negative.   Physical Exam Updated Vital Signs BP (!) 187/109   Pulse (!) 56   Resp 18   Ht '5\' 9"'$  (1.753 m)   Wt 83.2 kg   SpO2 98%   BMI 27.09 kg/m  Physical Exam Vitals and nursing Powell reviewed.  Constitutional:      General: He is not in acute distress.    Appearance: Normal appearance. He is well-developed.  HENT:     Head: Normocephalic and atraumatic.  Eyes:     Conjunctiva/sclera: Conjunctivae normal.     Pupils: Pupils are equal, round, and reactive to light.  Cardiovascular:     Rate and Rhythm: Normal rate and regular rhythm.     Heart sounds: Normal heart sounds.  Pulmonary:     Effort: Pulmonary effort is normal. No respiratory distress.     Breath sounds: Normal breath sounds.  Abdominal:     General: There is no distension.     Palpations: Abdomen is soft.     Tenderness: There is no abdominal tenderness.  Musculoskeletal:        General: No deformity. Normal range of motion.     Cervical back: Normal range of motion and neck supple.  Skin:    General: Skin is warm and  dry.  Neurological:     General: No focal deficit present.     Mental Status: He is alert.     Comments: Oriented to person and place after correction of blood glucose     ED Results / Procedures / Treatments   Labs (all labs ordered are listed, but only abnormal results are displayed) Labs Reviewed  CBC WITH DIFFERENTIAL/PLATELET - Abnormal; Notable for the following components:      Result Value   RDW 16.1 (*)    Neutro Abs 8.8 (*)    Abs Immature Granulocytes 0.15 (*)    All other components within normal limits  COMPREHENSIVE METABOLIC PANEL - Abnormal; Notable for the following components:   Sodium 130 (*)    CO2 14 (*)    Glucose, Bld 54 (*)    Creatinine, Ser 1.52 (*)    Calcium 8.8 (*)    Total Protein 5.7 (*)    Albumin 2.9 (*)    GFR, Estimated 46 (*)    All other components within normal limits  CBG MONITORING, ED - Abnormal; Notable for the following components:   Glucose-Capillary 39 (*)    All other components within normal limits  I-STAT CHEM 8, ED - Abnormal; Notable for the following components:   Sodium 133 (*)    Glucose, Bld 102 (*)    Calcium, Ion 0.80 (*)    TCO2 16 (*)    Hemoglobin 12.2 (*)    HCT 36.0 (*)    All other components within normal limits  CBG MONITORING, ED - Abnormal; Notable for the following components:   Glucose-Capillary 55 (*)    All other components within normal limits  RESP PANEL BY RT-PCR (RSV, FLU A&B, COVID)  [id number]  MAGNESIUM  URINALYSIS, ROUTINE W REFLEX MICROSCOPIC  CBG MONITORING, ED    EKG EKG Interpretation  Date/Time:  Sunday November 23 2022 09:35:34 EDT Ventricular Rate:  111 PR Interval:    QRS Duration: 127 QT Interval:  477 QTC Calculation: 493 R Axis:   -43 Text Interpretation: Atrial fibrillation Nonspecific IVCD with LAD Inferior infarct, old Anterior infarct, old Lateral leads are also involved Confirmed by Dene Gentry [phone]) on 11/23/2022 9:37:06 AM  Radiology CT Head Wo Contrast  Result  Date: 11/23/2022 CLINICAL DATA:  Mental status change, unknown cause EXAM: CT HEAD WITHOUT CONTRAST TECHNIQUE: Contiguous axial images were obtained from the base of the skull through the vertex without intravenous contrast. RADIATION DOSE REDUCTION: This exam was performed according to the departmental dose-optimization program which includes automated exposure control, adjustment of the mA and/or kV according to patient size and/or use of iterative reconstruction technique. COMPARISON:  10/16/2022 FINDINGS: Brain: No evidence of acute infarction,  hemorrhage, hydrocephalus, extra-axial collection or mass lesion/mass effect. Extensive low-density changes within the periventricular and subcortical white matter most compatible with chronic microvascular ischemic change. Mild-moderate diffuse cerebral volume loss. Vascular: Atherosclerotic calcifications involving the large vessels of the skull base. No unexpected hyperdense vessel. Skull: Normal. Negative for fracture or focal lesion. Sinuses/Orbits: New partial bilateral mastoid effusions. Paranasal sinuses are clear. Other: None. IMPRESSION: 1. No acute intracranial findings. 2. Chronic microvascular ischemic change and cerebral volume loss. 3. New partial bilateral mastoid effusions. Electronically Signed   By: Davina Poke D.O.   On: 11/23/2022 11:37   DG Chest Port 1 View  Result Date: 11/23/2022 CLINICAL DATA:  Weakness. EXAM: PORTABLE CHEST 1 VIEW COMPARISON:  10/16/2022. FINDINGS: Low lung volumes accentuate the pulmonary vasculature and cardiomediastinal silhouette. Patchy left retrocardiac opacity is favored to represent subsegmental atelectasis. No pleural effusion or pneumothorax. Dorsal column stimulator electrodes project over the T6-T8 levels. IMPRESSION: Patchy left retrocardiac opacity is favored to represent subsegmental atelectasis given low lung volumes. Electronically Signed   By: Emmit Alexanders M.D.   On: 11/23/2022 09:59     Procedures Procedures    Medications Ordered in ED Medications  dextrose 50 % solution 50 mL (has no administration in time range)  dextrose 5 %-0.45 % sodium chloride infusion (has no administration in time range)  dextrose 50 % solution 50 mL (50 mLs Intravenous Given 11/23/22 [id number])    ED Course/ Medical Decision Making/ A&P                             Medical Decision Making Amount and/or Complexity of Data Reviewed Labs: ordered. Radiology: ordered.  Risk Prescription drug management. Decision regarding hospitalization.    Medical Screen Complete  This patient presented to the ED with complaint of hypoglycemia.  This complaint involves an extensive number of treatment options. The initial differential diagnosis includes, but is not limited to, metabolic abnormality, etc.  This presentation is: Acute, Chronic, Self-Limited, Previously Undiagnosed, Uncertain Prognosis, Complicated, Systemic Symptoms, and Threat to Life/Bodily Function  Patient presents from his facility for evaluation of altered mental status.  EMS reports hypoglycemia.    Transient improvement in blood glucose noted with IV dextrose.  However, patient had recurrent hypoglycemia.  Patient required repeat dextrose administration and dextrose drip.  Patient is noted to be on glipizide per med records.  Given persistent hypoglycemia patient would benefit from admission for further treatment and observation.  Medicine team is aware of case will evaluate for admission.  Additional history obtained:  Additional history obtained from EMS External records from outside sources obtained and reviewed including prior ED visits and prior Inpatient records.    Lab Tests:  I ordered and personally interpreted labs.  The pertinent results include: CBG, CBC, CMP, UA   Imaging Studies ordered:  I ordered imaging studies including CT head, plain films of chest I independently visualized and interpreted  obtained imaging which showed NAD I agree with the radiologist interpretation.   Cardiac Monitoring:  The patient was maintained on a cardiac monitor.  I personally viewed and interpreted the cardiac monitor which showed an underlying rhythm of: NSR   Medicines ordered:  I ordered medication including dextrose for hypoglycemia Reevaluation of the patient after these medicines showed that the patient: improved  Problem List / ED Course:  Hypoglycemia   Reevaluation:  After the interventions noted above, I reevaluated the patient and found that they have: improved  Disposition:  After consideration of the diagnostic results and the patients response to treatment, I feel that the patent would benefit from admission.          Final Clinical Impression(s) / ED Diagnoses Final diagnoses:  Hypoglycemia    Rx / DC Orders ED Discharge Orders     None         Valarie Merino, MD 11/23/22 1559

## 2022-11-23 NOTE — ED Notes (Signed)
ED TO INPATIENT HANDOFF REPORT  ED Nurse Name and Phone #: Albina Billet [phone] Name/Age/Gender Albert Powell 82 y.o. male Room/Bed: 038C/038C  Code Status   Code Status: Full Code  Home/SNF/Other Skilled nursing facility Patient oriented to: self, place, time, and situation Is this baseline? Yes   Triage Complete: Triage complete  Chief Complaint Hypoglycemia [E16.2]  Triage Note Patient pesents to ED from [facility] via [place] per staff patient wasn't acting right , had slid out of bed and upon ems arrival patient was laying sideways on his bed. Glu was 37 EMS gave '1mg'$  Glucagon IM  CBC 55 upon arrival  patient is alert CBG was 39 patient remains alert Dr. Francia Greaves at bedside. CBG here 39 OJ given     Allergies Allergies  Allergen Reactions   Fentanyl Other (See Comments)    Behavioral changes   Gabapentin Swelling   Lisinopril Other (See Comments)    Unknown reaction   Lyrica [Pregabalin] Other (See Comments)    Unknown reaction   Metformin Diarrhea   Propofol Other (See Comments)    Heart rate dropped    Level of Care/Admitting Diagnosis ED Disposition   ED Disposition: Admit Condition: None Comment: Hospital Area: [place] [[id number]]  Level of Care: Progressive [102]  Admit to Progressive based on following criteria: GI, ENDOCRINE disease patients with GI bleeding, acute liver failure or pancreatitis, stable with diabetic ketoacidosis or thyrotoxicosis (hypothyroid) state.  May admit patient to [facility] or [facility] if equivalent level of care is available:: No  Covid Evaluation: Asymptomatic - no recent exposure (last 10 days) testing not required  Diagnosis: Hypoglycemia [id number]  Admitting Physician: Lucious Groves [[id number]]  Attending Physician: Lucious Groves [id number]  Certification:: I certify this patient will need inpatient services for at least 2 midnights      B Medical/Surgery History Past Medical History:  Diagnosis Date    Atrial fibrillation ([place]) 03/30/2020   Bradycardia, sinus 02/18/2013   CAD (coronary artery disease)    stent to RCA 2003 also had 40% lesion at that time; NUCLEAR STRESS TEST, 01/16/2010 - normal  now with cath 80-90% stenosis in LAD, will try medical therapy if no improvement PCI   Carotid artery disease ([place])    Cataract    CHF (congestive heart failure) ([place])    Chronic kidney disease (CKD), stage III (moderate) ([place])    Claudication ([place])    LEA DUPLEX, 07/07/2008 - Normal   COPD (chronic obstructive pulmonary disease) ([place])    DDD (degenerative disc disease) 2008   Diabetes mellitus    GERD (gastroesophageal reflux disease)    H/O hiatal hernia    History of colonoscopy 2004   finding of tics and AMV only no polpys   Hyperlipidemia 02/05/2013   Hypertension    Hypothyroidism    Obesity    Onychomycosis    PAD (peripheral artery disease) ([place])    Rosacea    Tobacco abuse 02/05/2013   Past Surgical History:  Procedure Laterality Date   ABDOMINAL AORTOGRAM W/LOWER EXTREMITY N/A 03/30/2018   Procedure: ABDOMINAL AORTOGRAM W/LOWER EXTREMITY;  Surgeon: Serafina Mitchell, MD;  Location: [facility];  Service: Cardiovascular;  Laterality: N/A;   ABDOMINAL AORTOGRAM W/LOWER EXTREMITY Bilateral 08/27/2020   Procedure: ABDOMINAL AORTOGRAM W/LOWER EXTREMITY;  Surgeon: Lorretta Harp, MD;  Location: [facility];  Service: Cardiovascular;  Laterality: Bilateral;   APPENDECTOMY     CARDIAC CATHETERIZATION  08/29/2002  2-vessel CAD with high-grade stenoses in RCA; stenting to RCA   CARDIAC CATHETERIZATION  2014   80-90% lesion will try medical therapy   CARDIAC SURGERY     stents  2003   CARDIOVERSION N/A 11/01/2013   Procedure: [place] COMPRESSION;  Surgeon: Lorretta Harp, MD;  Location: [facility] CATH LAB;  Service: Cardiovascular;  Laterality: N/A;   CAROTID ANGIOGRAM N/A 04/25/2013   Procedure: CAROTID ANGIOGRAM;  Surgeon: Lorretta Harp, MD;  Location: [facility] CATH  LAB;  Service: Cardiovascular;  Laterality: N/A;   CAROTID ENDARTERECTOMY     COLON RESECTION  3/04, 6/04    1 bleeding diverticulitis, 2 complete colectomy   COLON SURGERY  2005   renal pouch rectal anastimosis   CORONARY ANGIOPLASTY WITH STENT PLACEMENT  09/2002   2 stents to RCA   ENDARTERECTOMY Left 06/02/2013   Procedure: ENDARTERECTOMY CAROTID-LEFT;  Surgeon: Serafina Mitchell, MD;  Location: [facility];  Service: Vascular;  Laterality: Left;   INTRAVASCULAR PRESSURE WIRE/FFR STUDY N/A 04/02/2020   Procedure: INTRAVASCULAR PRESSURE WIRE/FFR STUDY;  Surgeon: Lorretta Harp, MD;  Location: [place] [facility];  Service: Cardiovascular;  Laterality: N/A;  DFR - RCA   LEFT HEART CATH AND CORONARY ANGIOGRAPHY N/A 04/02/2020   Procedure: LEFT HEART CATH AND CORONARY ANGIOGRAPHY;  Surgeon: Lorretta Harp, MD;  Location: [facility];  Service: Cardiovascular;  Laterality: N/A;   LEFT HEART CATHETERIZATION WITH CORONARY ANGIOGRAM N/A 02/08/2013   Procedure: LEFT HEART CATHETERIZATION WITH CORONARY ANGIOGRAM;  Surgeon: Lorretta Harp, MD;  Location: [facility] CATH LAB;  Service: Cardiovascular;  Laterality: N/A;   LOWER EXTREMITY ANGIOGRAM N/A 10/20/2013   Procedure: LOWER EXTREMITY ANGIOGRAM;  Surgeon: Lorretta Harp, MD;  Location: [facility] CATH LAB;  Service: Cardiovascular;  Laterality: N/A;   PATCH ANGIOPLASTY Left 06/02/2013   Procedure: PATCH ANGIOPLASTY;  Surgeon: Serafina Mitchell, MD;  Location: [facility] OR;  Service: Vascular;  Laterality: Left;   PERIPHERAL VASCULAR INTERVENTION Bilateral 03/30/2018   Procedure: PERIPHERAL VASCULAR INTERVENTION;  Surgeon: Serafina Mitchell, MD;  Location: [place];  Service: Cardiovascular;  Laterality: Bilateral;  common iliacs   PRESSURE SENSOR/CARDIOMEMS N/A 07/02/2022   Procedure: PRESSURE SENSOR/CARDIOMEMS;  Surgeon: Larey Dresser, MD;  Location: [place] [facility];  Service: Cardiovascular;  Laterality: N/A;   RIGHT HEART CATH N/A 07/02/2022    Procedure: RIGHT HEART CATH;  Surgeon: Larey Dresser, MD;  Location: [place] [place] [facility];  Service: Cardiovascular;  Laterality: N/A;   RIGHT/LEFT HEART CATH AND CORONARY ANGIOGRAPHY N/A 05/20/2022   Procedure: RIGHT/LEFT HEART CATH AND CORONARY ANGIOGRAPHY;  Surgeon: Early Osmond, MD;  Location: [facility] [facility];  Service: Cardiovascular;  Laterality: N/A;   TONSILLECTOMY       A IV Location/Drains/Wounds Patient Lines/Drains/Airways Status     Active Line/Drains/Airways     Name Placement date Placement time Site Days   Peripheral IV 11/23/22 20 G Left Antecubital 11/23/22  0943  Antecubital  less than 1   Peripheral IV 11/23/22 20 G 1.88" Anterior;Right;Upper Arm 11/23/22  1548  Arm  less than 1            Intake/Output Last 24 hours  Intake/Output Summary (Last 24 hours) at 11/23/2022 1600 Last data filed at 11/23/2022 1526 Gross per 24 hour  Intake 67.5 ml  Output no documentation  Net 67.5 ml    Labs/Imaging Results for orders placed or performed during the hospital encounter of 11/23/22 (from the past  48 hour(s))  CBG monitoring, ED     Status: Abnormal   Collection Time: 11/23/22  9:28 AM  Result Value Ref Range   Glucose-Capillary 39 (LL) 70 - 99 mg/dL    Comment: Glucose reference range applies only to samples taken after fasting for at least 8 hours.   Comment 1 Notify RN   CBC with Differential     Status: Abnormal   Collection Time: 11/23/22  9:29 AM  Result Value Ref Range   WBC 10.4 4.0 - 10.5 K/uL   RBC 4.95 4.22 - 5.81 MIL/uL   Hemoglobin 15.5 13.0 - 17.0 g/dL   HCT 45.0 39.0 - 52.0 %   MCV 90.9 80.0 - 100.0 fL   MCH 31.3 26.0 - 34.0 pg   MCHC 34.4 30.0 - 36.0 g/dL   RDW 16.1 (H) 11.5 - 15.5 %   Platelets 254 150 - 400 K/uL   nRBC 0.0 0.0 - 0.2 %   Neutrophils Relative % 84 %   Neutro Abs 8.8 (H) 1.7 - 7.7 K/uL   Lymphocytes Relative 7 %   Lymphs Abs 0.7 0.7 - 4.0 K/uL   Monocytes Relative 7 %   Monocytes Absolute 0.7 0.1 - 1.0 K/uL    Eosinophils Relative 1 %   Eosinophils Absolute 0.1 0.0 - 0.5 K/uL   Basophils Relative 0 %   Basophils Absolute 0.0 0.0 - 0.1 K/uL   Immature Granulocytes 1 %   Abs Immature Granulocytes 0.15 (H) 0.00 - 0.07 K/uL    Comment: Performed at [facility], [address] [place], [place] [address]  Comprehensive metabolic panel     Status: Abnormal   Collection Time: 11/23/22  9:29 AM  Result Value Ref Range   Sodium 130 (L) 135 - 145 mmol/L   Potassium 4.8 3.5 - 5.1 mmol/L   Chloride 101 98 - 111 mmol/L   CO2 14 (L) 22 - 32 mmol/L   Glucose, Bld 54 (L) 70 - 99 mg/dL    Comment: Glucose reference range applies only to samples taken after fasting for at least 8 hours.   BUN 11 8 - 23 mg/dL   Creatinine, Ser 1.52 (H) 0.61 - 1.24 mg/dL   Calcium 8.8 (L) 8.9 - 10.3 mg/dL   Total Protein 5.7 (L) 6.5 - 8.1 g/dL   Albumin 2.9 (L) 3.5 - 5.0 g/dL   AST 37 15 - 41 U/L   ALT 22 0 - 44 U/L   Alkaline Phosphatase 112 38 - 126 U/L   Total Bilirubin 0.8 0.3 - 1.2 mg/dL   GFR, Estimated 46 (L) >60 mL/min    Comment: (NOTE) Calculated using the CKD-EPI Creatinine Equation (2021)    Anion gap 15 5 - 15    Comment: Performed at [facility], [place] [address] [place], [place] [address]  Urinalysis, Routine w reflex microscopic -Urine, Clean Catch     Status: Abnormal   Collection Time: 11/23/22  9:29 AM  Result Value Ref Range   Color, Urine YELLOW YELLOW   APPearance CLEAR CLEAR   Specific Gravity, Urine 1.009 1.005 - 1.030   pH 7.0 5.0 - 8.0   Glucose, UA 150 (A) NEGATIVE mg/dL   Hgb urine dipstick NEGATIVE NEGATIVE   Bilirubin Urine NEGATIVE NEGATIVE   Ketones, ur NEGATIVE NEGATIVE mg/dL   Protein, ur >=300 (A) NEGATIVE mg/dL   Nitrite NEGATIVE NEGATIVE   Leukocytes,Ua NEGATIVE NEGATIVE   RBC / HPF 0-5 0 - 5 RBC/hpf  WBC, UA 0-5 0 - 5 WBC/hpf   Bacteria, UA NONE SEEN NONE SEEN   Squamous Epithelial / HPF 0-5 0 - 5 /HPF   Mucus PRESENT     Comment: Performed at [facility], [place] [address], [place], [place] [address]  Magnesium     Status: None   Collection Time: 11/23/22  9:29 AM  Result Value Ref Range   Magnesium 1.8 1.7 - 2.4 mg/dL    Comment: Performed at [facility] [address], [place], [place] [address]  Resp panel by RT-PCR (RSV, Flu A&B, Covid) Anterior Nasal Swab     Status: None   Collection Time: 11/23/22 10:17 AM   Specimen: Anterior Nasal Swab  Result Value Ref Range   SARS Coronavirus 2 by RT PCR NEGATIVE NEGATIVE   Influenza A by PCR NEGATIVE NEGATIVE   Influenza B by PCR NEGATIVE NEGATIVE    Comment: (NOTE) The Xpert Xpress SARS-CoV-2/FLU/RSV plus assay is intended as an aid in the diagnosis of influenza from Nasopharyngeal swab specimens and should not be used as a sole basis for treatment. Nasal washings and aspirates are unacceptable for Xpert Xpress SARS-CoV-2/FLU/RSV testing.  Fact Sheet for Patients: EntrepreneurPulse.com.au  Fact Sheet for Healthcare Providers: IncredibleEmployment.be  This test is not yet approved or cleared by the [place] FDA and has been authorized for detection and/or diagnosis of SARS-CoV-2 by FDA under an Emergency Use Authorization (EUA). This EUA will remain in effect (meaning this test can be used) for the duration of the COVID-19 declaration under Section 564(b)(1) of the Act, 21 U.S.C. section 360bbb-3(b)(1), unless the authorization is terminated or revoked.     Resp Syncytial Virus by PCR NEGATIVE NEGATIVE    Comment: (NOTE) Fact Sheet for Patients: EntrepreneurPulse.com.au  Fact Sheet for Healthcare Providers: IncredibleEmployment.be  This test is not yet approved or cleared by the [place] FDA and has been authorized for detection and/or diagnosis of SARS-CoV-2 by FDA under an Emergency Use Authorization (EUA). This EUA will remain in effect (meaning this test can be used) for the  duration of the COVID-19 declaration under Section 564(b)(1) of the Act, 21 U.S.C. section 360bbb-3(b)(1), unless the authorization is terminated or revoked.  Performed at [facility], [place] [address], [place], [place] [address]   CBG monitoring, ED     Status: None   Collection Time: 11/23/22 10:26 AM  Result Value Ref Range   Glucose-Capillary 99 70 - 99 mg/dL    Comment: Glucose reference range applies only to samples taken after fasting for at least 8 hours.  I-stat chem 8, ED     Status: Abnormal   Collection Time: 11/23/22 10:39 AM  Result Value Ref Range   Sodium 133 (L) 135 - 145 mmol/L   Potassium 4.0 3.5 - 5.1 mmol/L   Chloride 108 98 - 111 mmol/L   BUN 10 8 - 23 mg/dL   Creatinine, Ser 1.00 0.61 - 1.24 mg/dL   Glucose, Bld 102 (H) 70 - 99 mg/dL    Comment: Glucose reference range applies only to samples taken after fasting for at least 8 hours.   Calcium, Ion 0.80 (LL) 1.15 - 1.40 mmol/L   TCO2 16 (L) 22 - 32 mmol/L   Hemoglobin 12.2 (L) 13.0 - 17.0 g/dL   HCT 36.0 (L) 39.0 - 52.0 %   Comment NOTIFIED PHYSICIAN   CBG monitoring, ED     Status: Abnormal   Collection Time: 11/23/22 11:50 AM  Result Value Ref Range   Glucose-Capillary 55 (L) 70 - 99 mg/dL    Comment: Glucose reference range applies only to samples taken after fasting for at least 8 hours.  CBG monitoring, ED     Status: Abnormal   Collection Time: 11/23/22  1:02 PM  Result Value Ref Range   Glucose-Capillary 108 (H) 70 - 99 mg/dL    Comment: Glucose reference range applies only to samples taken after fasting for at least 8 hours.   Comment 1 Notify RN    Comment 2 Document in Chart   Sodium, urine, random     Status: None   Collection Time: 11/23/22  1:21 PM  Result Value Ref Range   Sodium, Ur <10 mmol/L    Comment: Performed at [facility], [place] [address] [place], [place] [address]  Osmolality, urine     Status: Abnormal   Collection Time: 11/23/22  1:21 PM  Result Value Ref  Range   Osmolality, Ur 236 (L) 300 - 900 mOsm/kg    Comment: Performed at [facility] [address] [place], [place] [address]  CBG monitoring, ED     Status: Abnormal   Collection Time: 11/23/22  3:30 PM  Result Value Ref Range   Glucose-Capillary 58 (L) 70 - 99 mg/dL    Comment: Glucose reference range applies only to samples taken after fasting for at least 8 hours.   CT ABDOMEN PELVIS WO CONTRAST  Result Date: 11/23/2022 CLINICAL DATA:  Abdominal pain, acute, nonlocalized. EXAM: CT ABDOMEN AND PELVIS WITHOUT CONTRAST TECHNIQUE: Multidetector CT imaging of the abdomen and pelvis was performed following the standard protocol without IV contrast. RADIATION DOSE REDUCTION: This exam was performed according to the departmental dose-optimization program which includes automated exposure control, adjustment of the mA and/or kV according to patient size and/or use of iterative reconstruction technique. COMPARISON:  CT abdomen and pelvis 10/03/2022 FINDINGS: Lower chest: New small right pleural effusion. Mild dependent atelectasis in both lower lobes. Extensive coronary atherosclerosis. Normal heart size. New trace pericardial effusion. Hepatobiliary: Diffusely increased density of the liver parenchyma likely related to MA odor in therapy. No focal liver abnormality. Persistent layering hyperdense material in the gallbladder suggesting sludge. No pericholecystic inflammation or biliary dilatation. Pancreas: Unremarkable. Spleen: Unremarkable. Adrenals/Urinary Tract: Unremarkable right adrenal gland. Unchanged 1.3 cm left adrenal nodule with attenuation of approximately 10 HU, most compatible with a benign adenoma and with no follow-up imaging recommended. Vascular calcifications in the renal hila. No hydronephrosis. Asymmetric, severe left renal atrophy. Unchanged 1.2 cm hyperdense lesion anteriorly in the right kidney likely reflecting a hemorrhagic or proteinaceous cyst and unchanged low-density  lesions in both kidneys measuring up to 3.2 cm on the left most compatible with cysts and for which no follow-up imaging is recommended. Unremarkable bladder. Stomach/Bowel: There is a small sliding hiatal hernia. Sequelae of previous colectomy are again identified with ileorectal anastomosis. A right-sided ventral upper abdominal wall hernia is again noted containing a loop of small bowel. A small rounded soft tissue density is again seen at the inferomedial aspect of the hernia sac without surrounding acute inflammatory changes. There is no evidence of bowel obstruction. Vascular/Lymphatic: Extensive aortoiliac atherosclerosis. Bilateral common iliac artery stents. No enlarged lymph nodes. Reproductive: Unremarkable prostate. Other: No ascites or pneumoperitoneum. Musculoskeletal: No acute osseous abnormality or suspicious osseous lesion. Diffuse lumbar disc and facet degeneration. Partially visualized spinal cord stimulator. IMPRESSION: 1. No acute abnormality identified in the abdomen or pelvis. 2. New small  right pleural effusion and trace pericardial effusion. 3. Unchanged right-sided ventral hernia containing a loop of small bowel. No evidence of bowel obstruction. 4.  Aortic Atherosclerosis (ICD10-I70.0). Electronically Signed   By: Logan Bores M.D.   On: 11/23/2022 15:14   CT Head Wo Contrast  Result Date: 11/23/2022 CLINICAL DATA:  Mental status change, unknown cause EXAM: CT HEAD WITHOUT CONTRAST TECHNIQUE: Contiguous axial images were obtained from the base of the skull through the vertex without intravenous contrast. RADIATION DOSE REDUCTION: This exam was performed according to the departmental dose-optimization program which includes automated exposure control, adjustment of the mA and/or kV according to patient size and/or use of iterative reconstruction technique. COMPARISON:  10/16/2022 FINDINGS: Brain: No evidence of acute infarction, hemorrhage, hydrocephalus, extra-axial collection or mass  lesion/mass effect. Extensive low-density changes within the periventricular and subcortical white matter most compatible with chronic microvascular ischemic change. Mild-moderate diffuse cerebral volume loss. Vascular: Atherosclerotic calcifications involving the large vessels of the skull base. No unexpected hyperdense vessel. Skull: Normal. Negative for fracture or focal lesion. Sinuses/Orbits: New partial bilateral mastoid effusions. Paranasal sinuses are clear. Other: None. IMPRESSION: 1. No acute intracranial findings. 2. Chronic microvascular ischemic change and cerebral volume loss. 3. New partial bilateral mastoid effusions. Electronically Signed   By: Davina Poke D.O.   On: 11/23/2022 11:37   DG Chest Port 1 View  Result Date: 11/23/2022 CLINICAL DATA:  Weakness. EXAM: PORTABLE CHEST 1 VIEW COMPARISON:  10/16/2022. FINDINGS: Low lung volumes accentuate the pulmonary vasculature and cardiomediastinal silhouette. Patchy left retrocardiac opacity is favored to represent subsegmental atelectasis. No pleural effusion or pneumothorax. Dorsal column stimulator electrodes project over the T6-T8 levels. IMPRESSION: Patchy left retrocardiac opacity is favored to represent subsegmental atelectasis given low lung volumes. Electronically Signed   By: Emmit Alexanders M.D.   On: 11/23/2022 09:59    Pending Labs Unresulted Labs (From admission, onward)     Start     Ordered   11/24/22 0700  Cortisol  Once,   R       Comments: Please draw between 6am-9am    11/23/22 1401   11/24/22 0500  CBC  Tomorrow morning,   R        11/23/22 1459   11/23/22 [id number]  Basic metabolic panel  Now then every 8 hours,   R (with TIMED occurrences)      11/23/22 1459   11/23/22 1529  Protein, urine, 24 hour  Once,   R        11/23/22 1528   11/23/22 1327  Lactic acid, plasma  STAT Now then every 3 hours,   R (with STAT occurrences)      11/23/22 1326   11/23/22 1321  Osmolality  Once,   URGENT        11/23/22 1323    11/23/22 1300  Phosphorus  Once,   STAT        11/23/22 1259   11/23/22 1254  TSH  Once,   URGENT        11/23/22 1253            Vitals/Pain Today's Vitals   11/23/22 1255 11/23/22 1345 11/23/22 1400 11/23/22 1455  BP: (Abnormal) 185/80 (Abnormal) 142/129 (Abnormal) 154/81   Pulse: 69 65 66   Resp: (Abnormal) '29 12 12   '$ Temp:    (Abnormal) 97.5 F (36.4 C)  TempSrc:    Axillary  SpO2: 100% 98% 99%   Weight:      Height:  PainSc:        Isolation Precautions No active isolations  Medications Medications  amiodarone (PACERONE) tablet 200 mg (has no administration in time range)  atorvastatin (LIPITOR) tablet 40 mg (has no administration in time range)  hydrALAZINE (APRESOLINE) tablet 100 mg (has no administration in time range)  apixaban (ELIQUIS) tablet 2.5 mg (has no administration in time range)  isosorbide mononitrate (IMDUR) 24 hr tablet 120 mg (has no administration in time range)  clopidogrel (PLAVIX) tablet 75 mg (has no administration in time range)  levothyroxine (SYNTHROID) tablet 75 mcg (has no administration in time range)  amLODipine (NORVASC) tablet 10 mg (has no administration in time range)  pantoprazole (PROTONIX) EC tablet 40 mg (has no administration in time range)  potassium chloride SA (KLOR-CON M) CR tablet 40 mEq (has no administration in time range)  dextrose 5 %-0.45 % sodium chloride infusion ( Intravenous New Bag/Given 11/23/22 1548)  dextrose 50 % solution 50 mL (50 mLs Intravenous Given 11/23/22 0943)  dextrose 50 % solution 50 mL (50 mLs Intravenous Given 11/23/22 1206)    Mobility walks with person assist     Focused Assessments Neuro Assessment Handoff:  Swallow screen pass? N/A speech did a evaluation         Neuro Assessment:   Neuro Checks:      Has TPA been given? No If patient is a Neuro Trauma and patient is going to OR before floor call report to [place] nurse: [phone] or [phone]   R Recommendations:  See Admitting Provider Note  Report given to:   Additional Notes: Speech did eval, suction needs to be set up at bedside.

## 2022-11-23 NOTE — ED Notes (Signed)
Patient transported to CT 

## 2022-11-23 NOTE — ED Notes (Signed)
Speech Therapy at bedside

## 2022-11-23 NOTE — Progress Notes (Signed)
ANTICOAGULATION CONSULT NOTE - Initial Consult  Pharmacy Consult for Enoxaparin  Indication: atrial fibrillation  Allergies  Allergen Reactions   Fentanyl Other (See Comments)    Behavioral changes   Gabapentin Swelling   Lisinopril Other (See Comments)    Unknown reaction   Lyrica [Pregabalin] Other (See Comments)    Unknown reaction   Metformin Diarrhea   Propofol Other (See Comments)    Heart rate dropped    Patient Measurements: Height: '5\' 9"'$  (175.3 cm) Weight: 83.2 kg (183 lb 6.8 oz) IBW/kg (Calculated) : 70.7  Vital Signs: Temp: 97.5 F (36.4 C) (03/10 1455) Temp Source: Axillary (03/10 1455) BP: 154/81 (03/10 1400) Pulse Rate: 66 (03/10 1400)  Labs: Recent Labs    11/23/22 0929 11/23/22 1039  HGB 15.5 12.2*  HCT 45.0 36.0*  PLT 254  --   CREATININE 1.52* 1.00    Estimated Creatinine Clearance: 58.9 mL/min (by C-G formula based on SCr of 1 mg/dL).   Medical History: Past Medical History:  Diagnosis Date   Atrial fibrillation ([place]) 03/30/2020   Bradycardia, sinus 02/18/2013   CAD (coronary artery disease)    stent to RCA 2003 also had 40% lesion at that time; NUCLEAR STRESS TEST, 01/16/2010 - normal  now with cath 80-90% stenosis in LAD, will try medical therapy if no improvement PCI   Carotid artery disease ([name])    Cataract    CHF (congestive heart failure) ([place])    Chronic kidney disease (CKD), stage III (moderate) ([place])    Claudication ([place])    LEA DUPLEX, 07/07/2008 - Normal   COPD (chronic obstructive pulmonary disease) ([name])    DDD (degenerative disc disease) 2008   Diabetes mellitus    GERD (gastroesophageal reflux disease)    H/O hiatal hernia    History of colonoscopy 2004   finding of tics and AMV only no polpys   Hyperlipidemia 02/05/2013   Hypertension    Hypothyroidism    Obesity    Onychomycosis    PAD (peripheral artery disease) ([place])    Rosacea    Tobacco abuse 02/05/2013    Assessment: Patient with PMH of Afib, holding  home Eliquis 2.'5MG'$  BID as patient has persistent N/V. Have called [facility] to attempt to get [facility] however have not received. Based on time of admission, last dose of Eliquis likely this morning.    Goal of Therapy:  Anti-Xa level 0.6-1 units/ml 4hrs after LMWH dose given Monitor platelets by anticoagulation protocol: Yes   Plan:  Lovenox '80mg'$  Q12H.  Monitor for s/sx of bleeding.  Monitor for resolution of N/V to transition back to Eliquis.    Esmeralda Arthur, PharmD, BCCCP  11/23/2022,4:49 PM

## 2022-11-23 NOTE — ED Notes (Signed)
All oral meds not given due to aspiration risk. Patient remains NPO at this time. MD is aware and will place orders for IV medications.

## 2022-11-23 NOTE — ED Notes (Signed)
ED TO INPATIENT HANDOFF REPORT  ED Nurse Name and Phone #: Altha Harm [phone]  S Name/Age/Gender Albert Powell 82 y.o. male Room/Bed: 038C/038C  Code Status   Code Status: Full Code  Home/SNF/Other Skilled nursing facility Patient oriented to: self Is this baseline? Yes   Triage Complete: Triage complete  Chief Complaint Hypoglycemia [E16.2]  Triage Note Patient pesents to ED from [facility] via [place] per staff patient wasn't acting right , had slid out of bed and upon ems arrival patient was laying sideways on his bed. Glu was 37 EMS gave '1mg'$  Glucagon IM  CBC 55 upon arrival  patient is alert CBG was 39 patient remains alert Dr. Francia Greaves at bedside. CBG here 39 OJ given     Allergies Allergies  Allergen Reactions   Fentanyl Other (See Comments)    Behavioral changes   Gabapentin Swelling   Lisinopril Other (See Comments)    Unknown reaction   Lyrica [Pregabalin] Other (See Comments)    Unknown reaction   Metformin Diarrhea   Propofol Other (See Comments)    Heart rate dropped    Level of Care/Admitting Diagnosis ED Disposition     ED Disposition  Admit   Condition  --   Comment  Hospital Area: [place] [[id number]]  Level of Care: Telemetry Medical [[id number]]  May admit patient to [facility] or [facility] if equivalent level of care is available:: No  Covid Evaluation: Asymptomatic - no recent exposure (last 10 days) testing not required  Diagnosis: Hypoglycemia [id number]  Admitting Physician: Lucious Groves [[id number]]  Attending Physician: Lucious Groves [id number]  Certification:: I certify this patient will need inpatient services for at least 2 midnights  Estimated Length of Stay: 5          B Medical/Surgery History Past Medical History:  Diagnosis Date   Atrial fibrillation ([place]) 03/30/2020   Bradycardia, sinus 02/18/2013   CAD (coronary artery disease)    stent to RCA 2003 also had 40% lesion at that time; NUCLEAR STRESS TEST,  01/16/2010 - normal  now with cath 80-90% stenosis in LAD, will try medical therapy if no improvement PCI   Carotid artery disease ([place])    Cataract    CHF (congestive heart failure) ([place])    Chronic kidney disease (CKD), stage III (moderate) ([place])    Claudication ([place])    LEA DUPLEX, 07/07/2008 - Normal   COPD (chronic obstructive pulmonary disease) ([place])    DDD (degenerative disc disease) 2008   Diabetes mellitus    GERD (gastroesophageal reflux disease)    H/O hiatal hernia    History of colonoscopy 2004   finding of tics and AMV only no polpys   Hyperlipidemia 02/05/2013   Hypertension    Hypothyroidism    Obesity    Onychomycosis    PAD (peripheral artery disease) ([place])    Rosacea    Tobacco abuse 02/05/2013   Past Surgical History:  Procedure Laterality Date   ABDOMINAL AORTOGRAM W/LOWER EXTREMITY N/A 03/30/2018   Procedure: ABDOMINAL AORTOGRAM W/LOWER EXTREMITY;  Surgeon: Serafina Mitchell, MD;  Location: [place] [facility];  Service: Cardiovascular;  Laterality: N/A;   ABDOMINAL AORTOGRAM W/LOWER EXTREMITY Bilateral 08/27/2020   Procedure: ABDOMINAL AORTOGRAM W/LOWER EXTREMITY;  Surgeon: Lorretta Harp, MD;  Location: [place] [facility];  Service: Cardiovascular;  Laterality: Bilateral;   APPENDECTOMY     CARDIAC CATHETERIZATION  08/29/2002   2-vessel CAD with high-grade stenoses in RCA; stenting to RCA  CARDIAC CATHETERIZATION  2014   80-90% lesion will try medical therapy   CARDIAC SURGERY     stents  2003   CARDIOVERSION N/A 11/01/2013   Procedure: [place] COMPRESSION;  Surgeon: Lorretta Harp, MD;  Location: [facility] CATH LAB;  Service: Cardiovascular;  Laterality: N/A;   CAROTID ANGIOGRAM N/A 04/25/2013   Procedure: CAROTID ANGIOGRAM;  Surgeon: Lorretta Harp, MD;  Location: [facility] CATH LAB;  Service: Cardiovascular;  Laterality: N/A;   CAROTID ENDARTERECTOMY     COLON RESECTION  3/04, 6/04    1 bleeding diverticulitis, 2 complete colectomy   COLON SURGERY   2005   renal pouch rectal anastimosis   CORONARY ANGIOPLASTY WITH STENT PLACEMENT  09/2002   2 stents to RCA   ENDARTERECTOMY Left 06/02/2013   Procedure: ENDARTERECTOMY CAROTID-LEFT;  Surgeon: Serafina Mitchell, MD;  Location: [place];  Service: Vascular;  Laterality: Left;   INTRAVASCULAR PRESSURE WIRE/FFR STUDY N/A 04/02/2020   Procedure: INTRAVASCULAR PRESSURE WIRE/FFR STUDY;  Surgeon: Lorretta Harp, MD;  Location: [place] [facility];  Service: Cardiovascular;  Laterality: N/A;  DFR - RCA   LEFT HEART CATH AND CORONARY ANGIOGRAPHY N/A 04/02/2020   Procedure: LEFT HEART CATH AND CORONARY ANGIOGRAPHY;  Surgeon: Lorretta Harp, MD;  Location: [place] [facility];  Service: Cardiovascular;  Laterality: N/A;   LEFT HEART CATHETERIZATION WITH CORONARY ANGIOGRAM N/A 02/08/2013   Procedure: LEFT HEART CATHETERIZATION WITH CORONARY ANGIOGRAM;  Surgeon: Lorretta Harp, MD;  Location[facility] C[facility] LAB;  Service: Cardiovascular;  Laterality: N/A;   LOWER EXTREMITY ANGIOGRAM N/A 10/20/2013   Procedure: LOWER EXTREMITY ANGIOGRAM;  Surgeon: Lorretta Harp, MD;  Location: [facility] CATH LAB;  Service: Cardiovascular;  Laterality: N/A;   PATCH ANGIOPLASTY Left 06/02/2013   Procedure: PATCH ANGIOPLASTY;  Surgeon: Serafina Mitchell, MD;  Location: [facility] OR;  Service: Vascular;  Laterality: Left;   PERIPHERAL VASCULAR INTERVENTION Bilateral 03/30/2018   Procedure: PERIPHERAL VASCULAR INTERVENTION;  Surgeon: Serafina Mitchell, MD;  Location: [place] [facility];  Service: Cardiovascular;  Laterality: Bilateral;  common iliacs   PRESSURE SENSOR/CARDIOMEMS N/A 07/02/2022   Procedure: PRESSURE SENSOR/CARDIOMEMS;  Surgeon: Larey Dresser, MD;  Location: [place] [facility];  Service: Cardiovascular;  Laterality: N/A;   RIGHT HEART CATH N/A 07/02/2022   Procedure: RIGHT HEART CATH;  Surgeon: Larey Dresser, MD;  Location: [place] [facility];  Service: Cardiovascular;  Laterality: N/A;   RIGHT/LEFT HEART CATH AND CORONARY ANGIOGRAPHY  N/A 05/20/2022   Procedure: RIGHT/LEFT HEART CATH AND CORONARY ANGIOGRAPHY;  Surgeon: Early Osmond, MD;  Location: [place] [facility];  Service: Cardiovascular;  Laterality: N/A;   TONSILLECTOMY       A IV Location/Drains/Wounds Patient Lines/Drains/Airways Status     Active Line/Drains/Airways     Name Placement date Placement time Site Days   Peripheral IV 11/23/22 20 G Left Antecubital 11/23/22  0943  Antecubital  less than 1            Intake/Output Last 24 hours No intake or output data in the 24 hours ending 11/23/22 1445  Labs/Imaging Results for orders placed or performed during the hospital encounter of 11/23/22 (from the past 48 hour(s))  CBG monitoring, ED     Status: Abnormal   Collection Time: 11/23/22  9:28 AM  Result Value Ref Range   Glucose-Capillary 39 (LL) 70 - 99 mg/dL    Comment: Glucose reference range applies only to samples taken after fasting for at least 8  hours.   Comment 1 Notify RN   CBC with Differential     Status: Abnormal   Collection Time: 11/23/22  9:29 AM  Result Value Ref Range   WBC 10.4 4.0 - 10.5 K/uL   RBC 4.95 4.22 - 5.81 MIL/uL   Hemoglobin 15.5 13.0 - 17.0 g/dL   HCT 45.0 39.0 - 52.0 %   MCV 90.9 80.0 - 100.0 fL   MCH 31.3 26.0 - 34.0 pg   MCHC 34.4 30.0 - 36.0 g/dL   RDW 16.1 (H) 11.5 - 15.5 %   Platelets 254 150 - 400 K/uL   nRBC 0.0 0.0 - 0.2 %   Neutrophils Relative % 84 %   Neutro Abs 8.8 (H) 1.7 - 7.7 K/uL   Lymphocytes Relative 7 %   Lymphs Abs 0.7 0.7 - 4.0 K/uL   Monocytes Relative 7 %   Monocytes Absolute 0.7 0.1 - 1.0 K/uL   Eosinophils Relative 1 %   Eosinophils Absolute 0.1 0.0 - 0.5 K/uL   Basophils Relative 0 %   Basophils Absolute 0.0 0.0 - 0.1 K/uL   Immature Granulocytes 1 %   Abs Immature Granulocytes 0.15 (H) 0.00 - 0.07 K/uL    Comment: Performed at [facility], [address] [place], [place] [address]  Comprehensive metabolic panel     Status: Abnormal   Collection Time: 11/23/22   9:29 AM  Result Value Ref Range   Sodium 130 (L) 135 - 145 mmol/L   Potassium 4.8 3.5 - 5.1 mmol/L   Chloride 101 98 - 111 mmol/L   CO2 14 (L) 22 - 32 mmol/L   Glucose, Bld 54 (L) 70 - 99 mg/dL    Comment: Glucose reference range applies only to samples taken after fasting for at least 8 hours.   BUN 11 8 - 23 mg/dL   Creatinine, Ser 1.52 (H) 0.61 - 1.24 mg/dL   Calcium 8.8 (L) 8.9 - 10.3 mg/dL   Total Protein 5.7 (L) 6.5 - 8.1 g/dL   Albumin 2.9 (L) 3.5 - 5.0 g/dL   AST 37 15 - 41 U/L   ALT 22 0 - 44 U/L   Alkaline Phosphatase 112 38 - 126 U/L   Total Bilirubin 0.8 0.3 - 1.2 mg/dL   GFR, Estimated 46 (L) >60 mL/min    Comment: (NOTE) Calculated using the CKD-EPI Creatinine Equation (2021)    Anion gap 15 5 - 15    Comment: Performed at [facility], [place] [address], [place], [place] [address]  Magnesium     Status: None   Collection Time: 11/23/22  9:29 AM  Result Value Ref Range   Magnesium 1.8 1.7 - 2.4 mg/dL    Comment: Performed at [facility] [address] [place], [place] [address]  Resp panel by RT-PCR (RSV, Flu A&B, Covid) Anterior Nasal Swab     Status: None   Collection Time: 11/23/22 10:17 AM   Specimen: Anterior Nasal Swab  Result Value Ref Range   SARS Coronavirus 2 by RT PCR NEGATIVE NEGATIVE   Influenza A by PCR NEGATIVE NEGATIVE   Influenza B by PCR NEGATIVE NEGATIVE    Comment: (NOTE) The Xpert Xpress SARS-CoV-2/FLU/RSV plus assay is intended as an aid in the diagnosis of influenza from Nasopharyngeal swab specimens and should not be used as a sole basis for treatment. Nasal washings and aspirates are unacceptable for Xpert Xpress SARS-CoV-2/FLU/RSV testing.  Fact Sheet for Patients: EntrepreneurPulse.com.au  Fact Sheet  for Healthcare Providers: IncredibleEmployment.be  This test is not yet approved or cleared by the [place] and has been authorized for detection and/or diagnosis of  SARS-CoV-2 by FDA under an Emergency Use Authorization (EUA). This EUA will remain in effect (meaning this test can be used) for the duration of the COVID-19 declaration under Section 564(b)(1) of the Act, 21 U.S.C. section 360bbb-3(b)(1), unless the authorization is terminated or revoked.     Resp Syncytial Virus by PCR NEGATIVE NEGATIVE    Comment: (NOTE) Fact Sheet for Patients: EntrepreneurPulse.com.au  Fact Sheet for Healthcare Providers: IncredibleEmployment.be  This test is not yet approved or cleared by the [place] FDA and has been authorized for detection and/or diagnosis of SARS-CoV-2 by FDA under an Emergency Use Authorization (EUA). This EUA will remain in effect (meaning this test can be used) for the duration of the COVID-19 declaration under Section 564(b)(1) of the Act, 21 U.S.C. section 360bbb-3(b)(1), unless the authorization is terminated or revoked.  Performed at [facility], [place] [address], [place],  [address]   CBG monitoring, ED     Status: None   Collection Time: 11/23/22 10:26 AM  Result Value Ref Range   Glucose-Capillary 99 70 - 99 mg/dL    Comment: Glucose reference range applies only to samples taken after fasting for at least 8 hours.  I-stat chem 8, ED     Status: Abnormal   Collection Time: 11/23/22 10:39 AM  Result Value Ref Range   Sodium 133 (L) 135 - 145 mmol/L   Potassium 4.0 3.5 - 5.1 mmol/L   Chloride 108 98 - 111 mmol/L   BUN 10 8 - 23 mg/dL   Creatinine, Ser 1.00 0.61 - 1.24 mg/dL   Glucose, Bld 102 (H) 70 - 99 mg/dL    Comment: Glucose reference range applies only to samples taken after fasting for at least 8 hours.   Calcium, Ion 0.80 (LL) 1.15 - 1.40 mmol/L   TCO2 16 (L) 22 - 32 mmol/L   Hemoglobin 12.2 (L) 13.0 - 17.0 g/dL   HCT 36.0 (L) 39.0 - 52.0 %   Comment NOTIFIED PHYSICIAN   CBG monitoring, ED     Status: Abnormal   Collection Time: 11/23/22 11:50 AM  Result  Value Ref Range   Glucose-Capillary 55 (L) 70 - 99 mg/dL    Comment: Glucose reference range applies only to samples taken after fasting for at least 8 hours.  CBG monitoring, ED     Status: Abnormal   Collection Time: 11/23/22  1:02 PM  Result Value Ref Range   Glucose-Capillary 108 (H) 70 - 99 mg/dL    Comment: Glucose reference range applies only to samples taken after fasting for at least 8 hours.   Comment 1 Notify RN    Comment 2 Document in Chart    CT Head Wo Contrast  Result Date: 11/23/2022 CLINICAL DATA:  Mental status change, unknown cause EXAM: CT HEAD WITHOUT CONTRAST TECHNIQUE: Contiguous axial images were obtained from the base of the skull through the vertex without intravenous contrast. RADIATION DOSE REDUCTION: This exam was performed according to the departmental dose-optimization program which includes automated exposure control, adjustment of the mA and/or kV according to patient size and/or use of iterative reconstruction technique. COMPARISON:  10/16/2022 FINDINGS: Brain: No evidence of acute infarction, hemorrhage, hydrocephalus, extra-axial collection or mass lesion/mass effect. Extensive low-density changes within the periventricular and subcortical white matter most compatible with chronic microvascular ischemic change. Mild-moderate diffuse cerebral  volume loss. Vascular: Atherosclerotic calcifications involving the large vessels of the skull base. No unexpected hyperdense vessel. Skull: Normal. Negative for fracture or focal lesion. Sinuses/Orbits: New partial bilateral mastoid effusions. Paranasal sinuses are clear. Other: None. IMPRESSION: 1. No acute intracranial findings. 2. Chronic microvascular ischemic change and cerebral volume loss. 3. New partial bilateral mastoid effusions. Electronically Signed   By: Davina Poke D.O.   On: 11/23/2022 11:37   DG Chest Port 1 View  Result Date: 11/23/2022 CLINICAL DATA:  Weakness. EXAM: PORTABLE CHEST 1 VIEW COMPARISON:   10/16/2022. FINDINGS: Low lung volumes accentuate the pulmonary vasculature and cardiomediastinal silhouette. Patchy left retrocardiac opacity is favored to represent subsegmental atelectasis. No pleural effusion or pneumothorax. Dorsal column stimulator electrodes project over the T6-T8 levels. IMPRESSION: Patchy left retrocardiac opacity is favored to represent subsegmental atelectasis given low lung volumes. Electronically Signed   By: Emmit Alexanders M.D.   On: 11/23/2022 09:59    Pending Labs Unresulted Labs (From admission, onward)     Start     Ordered   11/24/22 0700  Cortisol  Once,   R       Comments: Please draw between 6am-9am    11/23/22 1401   11/23/22 1327  Lactic acid, plasma  STAT Now then every 3 hours,   R (with STAT occurrences)      11/23/22 1326   11/23/22 1324  Osmolality, urine  Once,   URGENT        11/23/22 1323   11/23/22 1321  Sodium, urine, random  Once,   URGENT        11/23/22 1323   11/23/22 1321  Osmolality  Once,   URGENT        11/23/22 1323   11/23/22 1300  Phosphorus  Once,   STAT        11/23/22 1259   11/23/22 1254  TSH  Once,   URGENT        11/23/22 1253   11/23/22 0929  Urinalysis, Routine w reflex microscopic -Urine, Clean Catch  Once,   URGENT       Question:  Specimen Source  Answer:  Urine, Clean Catch   11/23/22 0929            Vitals/Pain Today's Vitals   11/23/22 1100 11/23/22 1255 11/23/22 1345 11/23/22 1400  BP: (!) 187/109 (!) 185/80 (!) 142/129 (!) 154/81  Pulse: (!) 56 69 65 66  Resp: 18 (!) '29 12 12  '$ SpO2: 98% 100% 98% 99%  Weight:      Height:      PainSc:        Isolation Precautions No active isolations  Medications Medications  dextrose 5 %-0.45 % sodium chloride infusion ( Intravenous Rate/Dose Change 11/23/22 1432)  dextrose 50 % solution 50 mL (50 mLs Intravenous Given 11/23/22 0943)  dextrose 50 % solution 50 mL (50 mLs Intravenous Given 11/23/22 1206)    Mobility non-ambulatory     Focused  Assessments Neuro Assessment Handoff:  Swallow screen pass?  Not completed yet         Neuro Assessment:   Neuro Checks:      Has TPA been given? No If patient is a Neuro Trauma and patient is going to OR before floor call report to [place] nurse: [phone] or [phone]   R Recommendations: See Admitting Provider Note  Report given to:   Additional Notes:

## 2022-11-23 NOTE — ED Triage Notes (Signed)
Patient pesents to ED from [facility] via [facility] per staff patient wasn't acting right , had slid out of bed and upon ems arrival patient was laying sideways on his bed. Glu was 37 EMS gave '1mg'$  Glucagon IM  CBC 55 upon arrival  patient is alert CBG was 39 patient remains alert Dr. Francia Greaves at bedside. CBG here 39 OJ given

## 2022-11-23 NOTE — Evaluation (Signed)
Clinical/Bedside Swallow Evaluation Patient Details  Name: Albert Powell MRN: [id number] Date of Birth: 01/13/1941  Today's Date: 11/23/2022 Time: SLP Start Time (ACUTE ONLY): [id number] SLP Stop Time (ACUTE ONLY): [id number] SLP Time Calculation (min) (ACUTE ONLY): 22 min  Past Medical History:  Past Medical History:  Diagnosis Date   Atrial fibrillation ([place]) 03/30/2020   Bradycardia, sinus 02/18/2013   CAD (coronary artery disease)    stent to RCA 2003 also had 40% lesion at that time; NUCLEAR STRESS TEST, 01/16/2010 - normal  now with cath 80-90% stenosis in LAD, will try medical therapy if no improvement PCI   Carotid artery disease ([name])    Cataract    CHF (congestive heart failure) ([place])    Chronic kidney disease (CKD), stage III (moderate) ([place])    Claudication ([place])    LEA DUPLEX, 07/07/2008 - Normal   COPD (chronic obstructive pulmonary disease) ([place])    DDD (degenerative disc disease) 2008   Diabetes mellitus    GERD (gastroesophageal reflux disease)    H/O hiatal hernia    History of colonoscopy 2004   finding of tics and AMV only no polpys   Hyperlipidemia 02/05/2013   Hypertension    Hypothyroidism    Obesity    Onychomycosis    PAD (peripheral artery disease) ([place])    Rosacea    Tobacco abuse 02/05/2013   Past Surgical History:  Past Surgical History:  Procedure Laterality Date   ABDOMINAL AORTOGRAM W/LOWER EXTREMITY N/A 03/30/2018   Procedure: ABDOMINAL AORTOGRAM W/LOWER EXTREMITY;  Surgeon: Albert Mitchell, MD;  Location: [place] [facility];  Service: Cardiovascular;  Laterality: N/A;   ABDOMINAL AORTOGRAM W/LOWER EXTREMITY Bilateral 08/27/2020   Procedure: ABDOMINAL AORTOGRAM W/LOWER EXTREMITY;  Surgeon: Albert Harp, MD;  Location: [place] [facility];  Service: Cardiovascular;  Laterality: Bilateral;   APPENDECTOMY     CARDIAC CATHETERIZATION  08/29/2002   2-vessel CAD with high-grade stenoses in RCA; stenting to RCA   CARDIAC CATHETERIZATION  2014   80-90%  lesion will try medical therapy   CARDIAC SURGERY     stents  2003   CARDIOVERSION N/A 11/01/2013   Procedure: Albert Powell COMPRESSION;  Surgeon: Albert Harp, MD;  Location: [facility] CATH LAB;  Service: Cardiovascular;  Laterality: N/A;   CAROTID ANGIOGRAM N/A 04/25/2013   Procedure: CAROTID ANGIOGRAM;  Surgeon: Albert Harp, MD;  Location: [facility] CATH LAB;  Service: Cardiovascular;  Laterality: N/A;   CAROTID ENDARTERECTOMY     COLON RESECTION  3/04, 6/04    1 bleeding diverticulitis, 2 complete colectomy   COLON SURGERY  2005   renal pouch rectal anastimosis   CORONARY ANGIOPLASTY WITH STENT PLACEMENT  09/2002   2 stents to RCA   ENDARTERECTOMY Left 06/02/2013   Procedure: ENDARTERECTOMY CAROTID-LEFT;  Surgeon: Albert Mitchell, MD;  Location: [place];  Service: Vascular;  Laterality: Left;   INTRAVASCULAR PRESSURE WIRE/FFR STUDY N/A 04/02/2020   Procedure: INTRAVASCULAR PRESSURE WIRE/FFR STUDY;  Surgeon: Albert Harp, MD;  Location: [place] [facility];  Service: Cardiovascular;  Laterality: N/A;  DFR - RCA   LEFT HEART CATH AND CORONARY ANGIOGRAPHY N/A 04/02/2020   Procedure: LEFT HEART CATH AND CORONARY ANGIOGRAPHY;  Surgeon: Albert Harp, MD;  Location: [place] [facility];  Service: Cardiovascular;  Laterality: N/A;   LEFT HEART CATHETERIZATION WITH CORONARY ANGIOGRAM N/A 02/08/2013   Procedure: LEFT HEART CATHETERIZATION WITH CORONARY ANGIOGRAM;  Surgeon: Albert Harp, MD;  Location: [facility] CATH LAB;  Service: Cardiovascular;  Laterality: N/A;   LOWER EXTREMITY ANGIOGRAM N/A 10/20/2013   Procedure: LOWER EXTREMITY ANGIOGRAM;  Surgeon: Albert Harp, MD;  Location: [facility] CATH LAB;  Service: Cardiovascular;  Laterality: N/A;   PATCH ANGIOPLASTY Left 06/02/2013   Procedure: PATCH ANGIOPLASTY;  Surgeon: Albert Mitchell, MD;  Location: [facility] OR;  Service: Vascular;  Laterality: Left;   PERIPHERAL VASCULAR INTERVENTION Bilateral 03/30/2018   Procedure: PERIPHERAL VASCULAR INTERVENTION;   Surgeon: Albert Mitchell, MD;  Location: [place] [facility];  Service: Cardiovascular;  Laterality: Bilateral;  common iliacs   PRESSURE SENSOR/CARDIOMEMS N/A 07/02/2022   Procedure: PRESSURE SENSOR/CARDIOMEMS;  Surgeon: Albert Dresser, MD;  Location: [place] [facility];  Service: Cardiovascular;  Laterality: N/A;   RIGHT HEART CATH N/A 07/02/2022   Procedure: RIGHT HEART CATH;  Surgeon: Albert Dresser, MD;  Location: [place] [facility];  Service: Cardiovascular;  Laterality: N/A;   RIGHT/LEFT HEART CATH AND CORONARY ANGIOGRAPHY N/A 05/20/2022   Procedure: RIGHT/LEFT HEART CATH AND CORONARY ANGIOGRAPHY;  Surgeon: Albert Osmond, MD;  Location: [place] [facility];  Service: Cardiovascular;  Laterality: N/A;   TONSILLECTOMY     HPI:  [name] is an 82 y/o male who presented to ED from [facility] SNF with AMS and hypoglycemia. PMHx type 2 DM, hypothyroidism, GERD, CKD 3b, COPD, HTN, atrial fibrillation, CAD, vascular dementia. Per chart review, pt's daughter reported worsening confusion and trouble swallowing over the last six months. He is unable to keep any food down without immediately regurgitating it back up.    Assessment / Plan / Recommendation  Clinical Impression  [name] was alert, oriented to self, and able to follow simple commands. Oral mechanism exam was normal. He participated in clinical swallowing assessment, demonstrating adequate oral attention and ability to sip from a straw and edge of cup.  There were no s/s of aspiration.  He consumed 2 oz of apple juice then several sips of water, leading to regurgitation of fluids and frothy sputum.  GIven his confusion, he had difficulty managing the emesis bag and needed assistance.  Recommend further esophageal workup.  Keep NPO, except allow occasional ice chips for comfort. D/W RN and medical team by way of [place]. Pt is preparating to transfer to 4E. SLP Visit Diagnosis: Dysphagia, unspecified (R13.10)           Diet  Recommendation   NPO except occasional ice chips       Other  Recommendations Recommended Consults: Consider esophageal assessment Oral Care Recommendations: Oral care QID    Recommendations for follow up therapy are one component of a multi-disciplinary discharge planning process, led by the attending physician.  Recommendations may be updated based on patient status, additional functional criteria and insurance authorization.  Follow up Recommendations No SLP follow up      Assistance Recommended at Discharge  SNF      Frequency and Duration min 1 x/week  1 week               Swallow Study   General HPI: Mr. Oquinn is an 82 y/o male who presented to ED from [facility] SNF with AMS and hypoglycemia. PMHx type 2 DM, hypothyroidism, GERD, CKD 3b, COPD, HTN, atrial fibrillation, CAD, vascular dementia. Per chart review, pt's daughter reported worsening confusion and trouble swallowing over the last six months. He is unable to keep any food down without immediately regurgitating it back up. Type of Study: Bedside Swallow Evaluation Previous Swallow Assessment: no Diet Prior to  this Study: NPO Temperature Spikes Noted: No Respiratory Status: Room air History of Recent Intubation: No Behavior/Cognition: Alert;Confused Oral Cavity Assessment: Within Functional Limits Oral Care Completed by SLP: Recent completion by staff Oral Cavity - Dentition: Adequate natural dentition Self-Feeding Abilities: Needs assist Patient Positioning: Upright in bed Baseline Vocal Quality: Normal Volitional Cough: Strong Volitional Swallow: Able to elicit    Oral/Motor/Sensory Function Overall Oral Motor/Sensory Function: Within functional limits   Ice Chips Ice chips: Within functional limits  - no oropharyngeal s/s  Thin Liquid Thin Liquid: Within functional limits -  no oropharyngeal s/s   Nectar Thick Nectar Thick Liquid: Not tested   Honey Thick Honey Thick Liquid: Not tested   Puree Puree:  Not tested   Solid     Solid: Not tested      Juan Quam Laurice 11/23/2022,4:17 PM  Estill Bamberg L. Tivis Ringer, MA CCC/SLP Clinical Specialist - [place] Office number [phone]

## 2022-11-23 NOTE — H&P (Cosign Needed Addendum)
Date: 11/23/2022               Patient Name:  Albert Powell MRN: [id number]  DOB: 31-Oct-1940 Age / Sex: 82 y.o., male   PCP: Patient, No Pcp Per         Medical Service: Internal Medicine Teaching Service         Attending Physician: Dr. Lucious Groves, DO      First Contact: Dr. Angelique Blonder, DO Pager [phone]    Second Contact: Dr. Delene Ruffini, MD Pager [phone]         After Hours (After 5p/  First Contact Pager: [phone]  weekends / holidays): Second Contact Pager: [phone]   SUBJECTIVE   Chief Complaint: Altered mental status, hypoglycemia  History of Present Illness:   Albert Powell is an 82 y/o person living with a history of type 2 DM, hypothyroidism, GERD, CKD 3b, COPD, HTN, atrial fibrillation, CAD, cognitive impairment, dysphagia who presents from [facility] for altered mental status and hypoglycemia.  Patient confused to situation. Per ED physician and patient's daugher he was not acting at his baseline and found to be hypoglycemia in this 82's. Per his daughter this has been occurring over the past week with continued confusion, hypoglycemia and falling out of bed. I spoke with his RN at [facility] who notes that he has been hypoglycemic since admission there over the last month with frequent episodes of hypoglycemia and poor PO intake. Per the nursing staff he is able to tolerate liquids fine but avoids eating solids. She notes an unwitnessed fall last week where he was found to be hypoglycemic  While speaking to his daughter, Albert Powell has had worsening confusion and trouble swallowing for the last few months. He is unable to keep any food down without immediately regurgitating it back up. He has been at [facility] since his last hospitalization a little over a month ago where he was found to have dehydration, severe electrolyte abnormalities   Today he is somewhat confused, he endorses some abdominal discomfort but denies pain elsewhere.  ED  Course: Persistently hypoglycemia despite amps of dextrose, requiring dextrose IV and admission  Meds:  Medication list from [facility] Eliquis 2.5 BID Potassium chloride 40 meq BID Sodium chloride 1 gm BID (completed 3/10) Zofran PRN Tolvaptan 15 mg (completed on 03/7) Atorvastatin 40 mg daily Glipizide 5 mg daily Hydralazine 100 mg TID Imdur 120 mg daily Plavix 75 mg daily Pantroprazole 40 mg every other day Levothyroxine 25 mcg daily Amlodipien 10 mg daily Amiodarone 200 mg BID  Past Medical History Dysphagia Cognitive impairment Type 2 DM COPD CAD Atrila Fibrillation CKD3b HLD GERD Hypothyroidism PVD Carotid artery stenosis Gastrointestinal hemorrhage HTN  Past Surgical History:  Procedure Laterality Date   ABDOMINAL AORTOGRAM W/LOWER EXTREMITY N/A 03/30/2018   Procedure: ABDOMINAL AORTOGRAM W/LOWER EXTREMITY;  Surgeon: Serafina Mitchell, MD;  Location: [place] [facility];  Service: Cardiovascular;  Laterality: N/A;   ABDOMINAL AORTOGRAM W/LOWER EXTREMITY Bilateral 08/27/2020   Procedure: ABDOMINAL AORTOGRAM W/LOWER EXTREMITY;  Surgeon: Lorretta Harp, MD;  Location: [place] [facility];  Service: Cardiovascular;  Laterality: Bilateral;   APPENDECTOMY     CARDIAC CATHETERIZATION  08/29/2002   2-vessel CAD with high-grade stenoses in RCA; stenting to RCA   CARDIAC CATHETERIZATION  2014   80-90% lesion will try medical therapy   CARDIAC SURGERY     stents  2003   CARDIOVERSION N/A 11/01/2013   Procedure: [place] COMPRESSION;  Surgeon: Lorretta Harp, MD;  Location: [place] CATH LAB;  Service: Cardiovascular;  Laterality: N/A;   CAROTID ANGIOGRAM N/A 04/25/2013   Procedure: CAROTID ANGIOGRAM;  Surgeon: Lorretta Harp, MD;  Location: [facility] CATH LAB;  Service: Cardiovascular;  Laterality: N/A;   CAROTID ENDARTERECTOMY     COLON RESECTION  3/04, 6/04    1 bleeding diverticulitis, 2 complete colectomy   COLON SURGERY  2005   renal pouch rectal anastimosis    CORONARY ANGIOPLASTY WITH STENT PLACEMENT  09/2002   2 stents to RCA   ENDARTERECTOMY Left 06/02/2013   Procedure: ENDARTERECTOMY CAROTID-LEFT;  Surgeon: Serafina Mitchell, MD;  Location: [place];  Service: Vascular;  Laterality: Left;   INTRAVASCULAR PRESSURE WIRE/FFR STUDY N/A 04/02/2020   Procedure: INTRAVASCULAR PRESSURE WIRE/FFR STUDY;  Surgeon: Lorretta Harp, MD;  Location: [place] [facility];  Service: Cardiovascular;  Laterality: N/A;  DFR - RCA   LEFT HEART CATH AND CORONARY ANGIOGRAPHY N/A 04/02/2020   Procedure: LEFT HEART CATH AND CORONARY ANGIOGRAPHY;  Surgeon: Lorretta Harp, MD;  Location: [place] [facility];  Service: Cardiovascular;  Laterality: N/A;   LEFT HEART CATHETERIZATION WITH CORONARY ANGIOGRAM N/A 02/08/2013   Procedure: LEFT HEART CATHETERIZATION WITH CORONARY ANGIOGRAM;  Surgeon: Lorretta Harp, MD;  Location: [facility] CATH LAB;  Service: Cardiovascular;  Laterality: N/A;   LOWER EXTREMITY ANGIOGRAM N/A 10/20/2013   Procedure: LOWER EXTREMITY ANGIOGRAM;  Surgeon: Lorretta Harp, MD;  Location: [facility] CATH LAB;  Service: Cardiovascular;  Laterality: N/A;   PATCH ANGIOPLASTY Left 06/02/2013   Procedure: PATCH ANGIOPLASTY;  Surgeon: Serafina Mitchell, MD;  Location: [facility] OR;  Service: Vascular;  Laterality: Left;   PERIPHERAL VASCULAR INTERVENTION Bilateral 03/30/2018   Procedure: PERIPHERAL VASCULAR INTERVENTION;  Surgeon: Serafina Mitchell, MD;  Location: [place] [facility];  Service: Cardiovascular;  Laterality: Bilateral;  common iliacs   PRESSURE SENSOR/CARDIOMEMS N/A 07/02/2022   Procedure: PRESSURE SENSOR/CARDIOMEMS;  Surgeon: Larey Dresser, MD;  Location: [place] [facility];  Service: Cardiovascular;  Laterality: N/A;   RIGHT HEART CATH N/A 07/02/2022   Procedure: RIGHT HEART CATH;  Surgeon: Larey Dresser, MD;  Location: [place] [facility];  Service: Cardiovascular;  Laterality: N/A;   RIGHT/LEFT HEART CATH AND CORONARY ANGIOGRAPHY N/A 05/20/2022   Procedure: RIGHT/LEFT  HEART CATH AND CORONARY ANGIOGRAPHY;  Surgeon: Early Osmond, MD;  Location: [place] [facility];  Service: Cardiovascular;  Laterality: N/A;   TONSILLECTOMY      Social:  Lives at [place] 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 Fully dependent on ADL/IADL per daughter PCP: [facility] Substances: Endorses prior history of tobacco and alcohol use. Unable to elaborate further  Family History  Problem Relation Age of Onset   Heart disease Father        heart attack at 50   Heart attack Father    Hyperlipidemia Father    Colonic polyp Mother        that bled out   COPD Mother    Diabetes Mother    Heart disease Sister    Colonic polyp Sister    Stroke Maternal Grandfather    Heart attack Paternal Grandfather    Other Neg Hx        hypogonadism    Allergies: Allergies as of 11/23/2022 - Review Complete 10/16/2022  Allergen Reaction Noted   Fentanyl Other (See Comments) 05/07/2011   Gabapentin Swelling 05/07/2011   Lisinopril Other (See Comments) 05/07/2011   Lyrica [  pregabalin] Other (See Comments) 05/07/2011   Metformin Diarrhea 04/09/2017   Propofol Other (See Comments) 01/15/2012   Review of Systems: A complete ROS was negative except as per HPI.   OBJECTIVE:   Physical Exam: Blood pressure (!) 187/109, pulse (!) 56, resp. rate 18, height '5\' 9"'$  (1.753 m), weight 83.2 kg, SpO2 98 %.  Constitutional: chronically ill appearing HENT: normocephalic atraumatic, mucous membranes dry Eyes: conjunctiva non-erythematous Neck: supple Cardiovascular: irregular rhythm, no m/r/g. 1+ lower extremity edema. 1+ bilateral DP pulses.  Pulmonary/Chest: normal work of breathing on room air, lungs clear to auscultation bilaterally Abdominal: soft, non-distended, diffusely tender with deep palpation MSK: normal bulk and tone Neurological: alert & oriented x to self, place and year, not oriented to situation. No focal  neurological deficits. Bilateral upper extremity resting and action tremor Skin: Extremities are warm. Ecchymosis of bilateral forearms and lateral aspect of right lower extremity near his knee Psych: pleasant  Labs: CBC    Component Value Date/Time   WBC 10.4 11/23/2022 0929   RBC 4.95 11/23/2022 0929   HGB 12.2 (L) 11/23/2022 1039   HGB 14.5 08/17/2020 1243   HCT 36.0 (L) 11/23/2022 1039   HCT 43.5 08/17/2020 1243   PLT 254 11/23/2022 0929   PLT 283 08/17/2020 1243   MCV 90.9 11/23/2022 0929   MCV 88 08/17/2020 1243   MCH 31.3 11/23/2022 0929   MCHC 34.4 11/23/2022 0929   RDW 16.1 (H) 11/23/2022 0929   RDW 13.2 08/17/2020 1243   LYMPHSABS 0.7 11/23/2022 0929   MONOABS 0.7 11/23/2022 0929   EOSABS 0.1 11/23/2022 0929   BASOSABS 0.0 11/23/2022 0929     CMP     Component Value Date/Time   NA 133 (L) 11/23/2022 1039   NA 137 08/17/2020 1243   K 4.0 11/23/2022 1039   CL 108 11/23/2022 1039   CO2 14 (L) 11/23/2022 0929   GLUCOSE 102 (H) 11/23/2022 1039   BUN 10 11/23/2022 1039   BUN 21 08/17/2020 1243   CREATININE 1.00 11/23/2022 1039   CREATININE 0.94 10/13/2013 1534   CALCIUM 8.8 (L) 11/23/2022 0929   PROT 5.7 (L) 11/23/2022 0929   ALBUMIN 2.9 (L) 11/23/2022 0929   AST 37 11/23/2022 0929   ALT 22 11/23/2022 0929   ALKPHOS 112 11/23/2022 0929   BILITOT 0.8 11/23/2022 0929   GFRNONAA 46 (L) 11/23/2022 0929   GFRAA 52 (L) 08/17/2020 1243    Imaging:  Chest xray - no focal opacity or effusions noted. No mass.   EKG: personally reviewed my interpretation is atrial fibrillation. Appears unchanged from prior EKG last month   ASSESSMENT & PLAN:   Assessment & Plan by Problem: Principal Problem:   Hypoglycemia  Albert Powell is a 82 y.o. person living with a history of type 2 DM, hypothyroidism, GERD, CKD 3b, COPD, HTN, atrial fibrillation, CAD, cognitive impairment, dysphagia who presented with encephalopathy and hypoglycemia and admitted for further  evaluation on hospital day 0  Hypoglycemia Patient with recurrent episodes of hypoglycemia. He is on glipizide daily and per his daughter/[facility] RN he has had had a poor appetite over the last few weeks. This is likely the cause of his hypoglycemia. His last dose of glipizide was this morning. He is requiring IV dextrose that we will try to discontinue once his glucose levels stabilize. Work up for dysphagia as per below. With recurrent hypoglycemia and incidental adrenal mass found during last hospitalization will check a morning cortisol. - D5 in .45 NS,  decrease fluids to 75cc's per hour with history of HF. DC when able - q2h cbg - discontinue glipizide - morning cortisol pending - dysphagia work up pending  Acute encephalopathy Likely underlying vascular dementia Patient presented with increasing confusion over the last week with a fall out of bed. He was found to be hypoglycemic which I suspect is a largely contributing to his presentation. Likely has underlying vascular dementia, per his daughter has had increasing confusion over the last few months. CT imaging today with chronic microvascular disease, no acute hemorrhage with recent fall on eliquis/clopidogrel -continue to monitor for improvement with correction of electrolyte derangements and hypoglycemia  Nausea/vomiting Abdominal pain Patient's daughter notes he has had frequent nausea and vomiting recently. With his encephalopathy he does not endorse this, but does note diffuse abdominal tenderness. Will assess with ct a/p. Do not suspect acute abdomen. Will continue protonix to assure not GERD symptoms -CT abdomen and pelvis pending -phosphorus pending, mag and K normal -continue protonix with hx of GERD  Dysphagia Poor appetite Per patient's daughter he has had trouble with eating over the last 6 months. She states it has progressively worsened to the point where he immediately starts gagging with initial swallowing of  food/drink. Does not appear as though he has been evaluated by speech therapy before. Will need to determine type of dysphagia and can pursue further work up from there.  -SLP evaluation -NPO until bedside swallow evaluation  Hyponatremia Present for the last few months thought to be hypovolemic with poor PO intake. Last urine sodium of 126. Per [place] documents appears he has been taking salt tabs and tolvaptam that ended 3 days ago. Will repeat studies prior to starting to reassess. Likely hypovolemic vs poor po intake. Checking morning cortisol. Will monitor every few hours with.45 NS given with IV D5 -q8h BMP -osmolality, urine sodium and osmolality pending  Anion gap metabolic acidosis Anion gap of 15 and CO2 of 14. With poor PO intake and frequent nausea and vomiting likely starvation ketosis. Continue IV fluids and assessing oral intake. He is afebrile and without a leukocytosis, extremities are warm do not suspect poor perfusion or sepsis.  -UA pending -repeat bmp   Incidental adrenal mass Found on CT imaging 10/03/22. Does not appear morning cortisol has been checked per chart review. With hypoglycemia and hyponatremia will go ahead and order this -morning cortisol pending  CKD 3b Cr and GFR at baseline and stable -daily bmp  Tremors Bilateral upper extremity tremor with rest and actions. Daughter notes worsening over the last few weeks. Possibly related to his poor PO intake and hypoglycemia.  -continue to replete electrolytes -TSH pending -see if improvement in glucose levels improves tremor  Right lower lobe pulmonary nodule CT a/p with irregular 5 mm pulmonary nodule on 1/19. Will need outpatient follow up with CT chest.   Hx of hypothyroidism -TSH pending -continue home levothyroxine once swallowing evaluated  Hx of CAD s/p PCI Continue home plavix, statin, and imdur  Hx of HFpEF Most recent EF 55-60%. Some lower extremity edema, likely multifactorial. No longer  on diuretics. Do not suspect CHF exacerbation -continue to monitor fluid status with IV dextrose  Atrial fibrillation Continue home eliquis 2.5 mg BID and amiodarone 200 mg BID  HTN Continue home amlodipine 10 mg and hydralazine 100 mg TID  Goals of care planning The patient has had worsening confusion, poor PO intake, and overall decline per his daugther. The patient is no longer able to take care  of himself and is now at [facility] for his care. He requires assistance for his ADL/IADL. As such, the patient's daughter has been trying to help them with this planning. More recently they have both changed their minds from DNR to full code status. I expressed my concerns with her fathers age and frailty that I do not believe he would recover after a significant event like cardiac/respiratory arrest and she agrees. He is unable to make decisions right now with his confusion, will keep full code per his recent wishes. Patient's daughter is quite overwhelmed with managing both the patient and his wife's clinical declines recently. Will see how his swallowing does and if needed can consider palliative consult.  -continue to follow -full code for now -if further work up during hospitalization concerning for non-reversible causes believe it is reasonable to consult palliative care with his steady decline  Diet: NPO until bedside swallow eval VTE: DOAC IVF: D5NS Code: Full  Prior to Admission Living Arrangement:  [facility] Anticipated Discharge Location:  [facility] Barriers to Discharge: Further improvement in hypoglycemia and mentation  Dispo: Admit patient to Observation with expected length of stay less than 2 midnights.  Signed: Riesa Pope, MD Internal Medicine Resident PGY-3  11/23/2022, 1:28 PM

## 2022-11-24 ENCOUNTER — Inpatient Hospital Stay (HOSPITAL_COMMUNITY): Payer: Medicare Other

## 2022-11-24 DIAGNOSIS — E440 Moderate protein-calorie malnutrition: Secondary | ICD-10-CM | POA: Diagnosis present

## 2022-11-24 DIAGNOSIS — R638 Other symptoms and signs concerning food and fluid intake: Secondary | ICD-10-CM

## 2022-11-24 LAB — CBC
HCT: 46.5 % (ref 39.0–52.0)
Hemoglobin: 16.5 g/dL (ref 13.0–17.0)
MCH: 31.3 pg (ref 26.0–34.0)
MCHC: 35.5 g/dL (ref 30.0–36.0)
MCV: 88.2 fL (ref 80.0–100.0)
Platelets: 183 10*3/uL (ref 150–400)
RBC: 5.27 MIL/uL (ref 4.22–5.81)
RDW: 15.9 % — ABNORMAL HIGH (ref 11.5–15.5)
WBC: 9.2 10*3/uL (ref 4.0–10.5)
nRBC: 0 % (ref 0.0–0.2)

## 2022-11-24 LAB — BASIC METABOLIC PANEL
Anion gap: 13 (ref 5–15)
BUN: 9 mg/dL (ref 8–23)
CO2: 17 mmol/L — ABNORMAL LOW (ref 22–32)
Calcium: 8.6 mg/dL — ABNORMAL LOW (ref 8.9–10.3)
Chloride: 101 mmol/L (ref 98–111)
Creatinine, Ser: 1.39 mg/dL — ABNORMAL HIGH (ref 0.61–1.24)
GFR, Estimated: 51 mL/min — ABNORMAL LOW (ref >60)
Glucose, Bld: 104 mg/dL — ABNORMAL HIGH (ref 70–99)
Potassium: 3.6 mmol/L (ref 3.5–5.1)
Sodium: 131 mmol/L — ABNORMAL LOW (ref 135–145)

## 2022-11-24 LAB — GLUCOSE, CAPILLARY
Glucose-Capillary: 103 mg/dL — ABNORMAL HIGH (ref 70–99)
Glucose-Capillary: 105 mg/dL — ABNORMAL HIGH (ref 70–99)
Glucose-Capillary: 106 mg/dL — ABNORMAL HIGH (ref 70–99)
Glucose-Capillary: 108 mg/dL — ABNORMAL HIGH (ref 70–99)
Glucose-Capillary: 109 mg/dL — ABNORMAL HIGH (ref 70–99)
Glucose-Capillary: 113 mg/dL — ABNORMAL HIGH (ref 70–99)
Glucose-Capillary: 117 mg/dL — ABNORMAL HIGH (ref 70–99)
Glucose-Capillary: 117 mg/dL — ABNORMAL HIGH (ref 70–99)
Glucose-Capillary: 121 mg/dL — ABNORMAL HIGH (ref 70–99)
Glucose-Capillary: 84 mg/dL (ref 70–99)
Glucose-Capillary: 96 mg/dL (ref 70–99)
Glucose-Capillary: 97 mg/dL (ref 70–99)
Glucose-Capillary: 97 mg/dL (ref 70–99)

## 2022-11-24 MED ORDER — DEXTROSE-NACL 5-0.45 % IV SOLN: INTRAVENOUS | Status: AC

## 2022-11-24 MED ORDER — DEXTROSE-NACL 5-0.45 % IV SOLN: INTRAVENOUS | Status: DC

## 2022-11-24 NOTE — Evaluation (Signed)
Physical Therapy Evaluation Patient Details Name: Albert Powell MRN: [id number] DOB: 1941-09-02 Today's Date: 11/24/2022  History of Present Illness  82 y.o. male from [facility] SN[facility] via EMS 3/11 where staff report pt was confused and altered. Pt had slid out of bed to the floor. Found to have initial blood sugar 37.  PMH includes CAD, chronic diastolic CHF, HTN, HLD, A-fib, COPD, DM2, dementia.  Clinical Impression  PTA pt resident of [facility] receiving rehabilitation from prior hospitalization. Pt is oriented to person and place, follows commands intermittently and is somewhat lethargic. Pt with increased edema in all extremities and exhibits profound weakness. Pt is maxA for bed mobility and total A for transfer to recliner. PT recommends return to [facility] when medically stable. PT will continue to follow acutely.       Recommendations for follow up therapy are one component of a multi-disciplinary discharge planning process, led by the attending physician.  Recommendations may be updated based on patient status, additional functional criteria and insurance authorization.  Follow Up Recommendations Skilled nursing-short term rehab (<3 hours/day) Can patient physically be transported by private vehicle: No          Equipment Recommendations  (TBD at next venue)  Recommendations for Other Services  OT consult    Functional Status Assessment Patient has had a recent decline in their functional status and demonstrates the ability to make significant improvements in function in a reasonable and predictable amount of time.     Precautions / Restrictions Precautions Precautions: Fall Precaution Comments: slid out of bed in SNF prior to hospitalizaton      Mobility  Bed Mobility Overal bed mobility: Needs Assistance Bed Mobility: Sidelying to Sit   Sidelying to sit: Max assist       General bed mobility comments: pt in L sidelying on entrance, maxA to manage LE off bed,  bring trunk to upright and scoot hips to EoB    Transfers Overall transfer level: Needs assistance   Transfers: Bed to chair/wheelchair/BSC       Squat pivot transfers: Total assist     General transfer comment: unable to come to upright with 2 attempts, requires total A for offweighting hips and pivotiing to recliner       Balance Overall balance assessment: Needs assistance Sitting-balance support: Feet supported, Bilateral upper extremity supported Sitting balance-Leahy Scale: Poor Sitting balance - Comments: able to maintain sitting balance with increased slumped over positioning                                     Pertinent Vitals/Pain Pain Assessment Pain Assessment: Faces Faces Pain Scale: Hurts little more Pain Location: generalized with movement Pain Descriptors / Indicators: Grimacing, Moaning, Discomfort Pain Intervention(s): Limited activity within patient's tolerance, Monitored during session, Repositioned    Home Living Family/patient expects to be discharged to:: Skilled nursing facility                   Additional Comments: [facility] where he has been since last hospitalization    Prior Function Prior Level of Function : Patient poor historian/Family not available             Mobility Comments: pt reports he was working with PT/OT at [place], walking short distance with RW       Hand Dominance   Dominant Hand: Right    Extremity/Trunk Assessment   Upper Extremity Assessment  Upper Extremity Assessment: RUE deficits/detail;LUE deficits/detail RUE Deficits / Details: increased edema, and erythema LUE Deficits / Details: increased edema, and erythema    Lower Extremity Assessment Lower Extremity Assessment: RLE deficits/detail;LLE deficits/detail RLE Deficits / Details: edematous decreasing AROM LLE Deficits / Details: edematous, decreasing AROM    Cervical / Trunk Assessment Cervical / Trunk Assessment:  Kyphotic  Communication   Communication: HOH  Cognition Arousal/Alertness: Lethargic Behavior During Therapy: Flat affect Overall Cognitive Status: History of cognitive impairments - at baseline                                 General Comments: oriented to self and location, follows commands inconsistently, repeatedly asking for cold, water        General Comments General comments (skin integrity, edema, etc.): pt with increased edema and erythema, throughout extremities, VSS on RA        Assessment/Plan    PT Assessment Patient needs continued PT services  PT Problem List Decreased strength;Decreased range of motion;Decreased activity tolerance;Decreased balance;Decreased mobility;Decreased cognition;Decreased coordination;Decreased safety awareness;Decreased skin integrity;Pain       PT Treatment Interventions Gait training;DME instruction;Functional mobility training;Therapeutic activities;Therapeutic exercise;Balance training;Cognitive remediation;Patient/family education    PT Goals (Current goals can be found in the Care Plan section)  Acute Rehab PT Goals Patient Stated Goal: none stated PT Goal Formulation: Patient unable to participate in goal setting Time For Goal Achievement: 12/08/22 Potential to Achieve Goals: Fair    Frequency Min 2X/week        AM-PAC PT "6 Clicks" Mobility  Outcome Measure Help needed turning from your back to your side while in a flat bed without using bedrails?: Total Help needed moving from lying on your back to sitting on the side of a flat bed without using bedrails?: Total Help needed moving to and from a bed to a chair (including a wheelchair)?: Total Help needed standing up from a chair using your arms (e.g., wheelchair or bedside chair)?: Total Help needed to walk in hospital room?: Total Help needed climbing 3-5 steps with a railing? : Total 6 Click Score: 6    End of Session Equipment Utilized During  Treatment: Gait belt Activity Tolerance: Patient limited by fatigue;Patient limited by lethargy Patient left: in chair;with call bell/phone within reach;with chair alarm set Nurse Communication: Mobility status (Mobility specialist to assist with return to bed) PT Visit Diagnosis: Muscle weakness (generalized) (M62.81);History of falling (Z91.81);Difficulty in walking, not elsewhere classified (R26.2)    Time: [id number] PT Time Calculation (min) (ACUTE ONLY): 30 min   Charges:   PT Evaluation $PT Eval Moderate Complexity: 1 Mod PT Treatments $Therapeutic Activity: 8-22 mins        Ariella Voit B. Migdalia Dk PT, DPT Acute Rehabilitation Services Please use secure chat or  Call Office [phone]   [place] 11/24/2022, 12:05 PM

## 2022-11-24 NOTE — Progress Notes (Signed)
HD#1 Subjective:   Summary: Albert Powell is a 82 y.o. male with PMH of  type 2 DM, hypothyroidism, GERD, CKD 3b, COPD, HTN, atrial fibrillation, CAD, cognitive impairment, dysphagia who presented with altered mental status and hypoglycemia and admitted for encephalopathy.   Overnight Events: none  Patient resting in bed covered up. He was alert and oriented to self and place. Denies any pain or discomfort. Would like something to drink but advised patient he has trouble with swallowing still.   Objective:  Vital signs in last 24 hours: Vitals:   11/23/22 1937 11/23/22 1955 11/23/22 2347 11/24/22 0340  BP: (!) 89/69 (!) 140/105 (!) 159/71 (!) 172/71  Pulse: 69 68 71 68  Resp: '17 20 20 17  '$ Temp:   97.8 F (36.6 C) 98.1 F (36.7 C)  TempSrc:   Oral Oral  SpO2: 100% 98% 100% 100%  Weight:      Height:       Supplemental O2: Room Air SpO2: 100 %   Physical Exam:  Constitutional: chronically ill appearing male lying in bed, in no acute distress Neck: supple Cardiovascular: regular rate  Pulmonary/Chest: normal work of breathing on RA MSK: normal bulk and tone Neurological: alert & oriented to self and place Skin: dry skin with multiple ecchymosis noted on bilateral forearms and LE and right hip, erythema and possible skin breakdown of right buttock  Filed Weights   11/23/22 0943  Weight: 83.2 kg     Intake/Output Summary (Last 24 hours) at 11/24/2022 [id number] Last data filed at 11/24/2022 [id number] Gross per 24 hour  Intake 67.5 ml  Output 950 ml  Net -882.5 ml   Net IO Since Admission: -882.5 mL [11/24/22 0627]  Pertinent Labs:    Latest Ref Rng & Units 11/24/2022   12:57 AM 11/23/2022   10:39 AM 11/23/2022    9:29 AM  CBC  WBC 4.0 - 10.5 K/uL 9.2   10.4   Hemoglobin 13.0 - 17.0 g/dL 16.5  12.2  15.5   Hematocrit 39.0 - 52.0 % 46.5  36.0  45.0   Platelets 150 - 400 K/uL 183   254        Latest Ref Rng & Units 11/24/2022   12:57 AM 11/23/2022    6:04 PM  11/23/2022   10:39 AM  CMP  Glucose 70 - 99 mg/dL 104  51  102   BUN 8 - 23 mg/dL '9  11  10   '$ Creatinine 0.61 - 1.24 mg/dL 1.39  1.45  1.00   Sodium 135 - 145 mmol/L 131  132  133   Potassium 3.5 - 5.1 mmol/L 3.6  4.3  4.0   Chloride 98 - 111 mmol/L 101  101  108   CO2 22 - 32 mmol/L 17  14    Calcium 8.9 - 10.3 mg/dL 8.6  8.5      Imaging: CT ABDOMEN PELVIS WO CONTRAST  Result Date: 11/23/2022 CLINICAL DATA:  Abdominal pain, acute, nonlocalized. EXAM: CT ABDOMEN AND PELVIS WITHOUT CONTRAST TECHNIQUE: Multidetector CT imaging of the abdomen and pelvis was performed following the standard protocol without IV contrast. RADIATION DOSE REDUCTION: This exam was performed according to the departmental dose-optimization program which includes automated exposure control, adjustment of the mA and/or kV according to patient size and/or use of iterative reconstruction technique. COMPARISON:  CT abdomen and pelvis 10/03/2022 FINDINGS: Lower chest: New small right pleural effusion. Mild dependent atelectasis in both lower lobes. Extensive coronary atherosclerosis. Normal heart  size. New trace pericardial effusion. Hepatobiliary: Diffusely increased density of the liver parenchyma likely related to MA odor in therapy. No focal liver abnormality. Persistent layering hyperdense material in the gallbladder suggesting sludge. No pericholecystic inflammation or biliary dilatation. Pancreas: Unremarkable. Spleen: Unremarkable. Adrenals/Urinary Tract: Unremarkable right adrenal gland. Unchanged 1.3 cm left adrenal nodule with attenuation of approximately 10 HU, most compatible with a benign adenoma and with no follow-up imaging recommended. Vascular calcifications in the renal hila. No hydronephrosis. Asymmetric, severe left renal atrophy. Unchanged 1.2 cm hyperdense lesion anteriorly in the right kidney likely reflecting a hemorrhagic or proteinaceous cyst and unchanged low-density lesions in both kidneys measuring up to  3.2 cm on the left most compatible with cysts and for which no follow-up imaging is recommended. Unremarkable bladder. Stomach/Bowel: There is a small sliding hiatal hernia. Sequelae of previous colectomy are again identified with ileorectal anastomosis. A right-sided ventral upper abdominal wall hernia is again noted containing a loop of small bowel. A small rounded soft tissue density is again seen at the inferomedial aspect of the hernia sac without surrounding acute inflammatory changes. There is no evidence of bowel obstruction. Vascular/Lymphatic: Extensive aortoiliac atherosclerosis. Bilateral common iliac artery stents. No enlarged lymph nodes. Reproductive: Unremarkable prostate. Other: No ascites or pneumoperitoneum. Musculoskeletal: No acute osseous abnormality or suspicious osseous lesion. Diffuse lumbar disc and facet degeneration. Partially visualized spinal cord stimulator. IMPRESSION: 1. No acute abnormality identified in the abdomen or pelvis. 2. New small right pleural effusion and trace pericardial effusion. 3. Unchanged right-sided ventral hernia containing a loop of small bowel. No evidence of bowel obstruction. 4.  Aortic Atherosclerosis (ICD10-I70.0). Electronically Signed   By: Logan Bores M.D.   On: 11/23/2022 15:14   CT Head Wo Contrast  Result Date: 11/23/2022 CLINICAL DATA:  Mental status change, unknown cause EXAM: CT HEAD WITHOUT CONTRAST TECHNIQUE: Contiguous axial images were obtained from the base of the skull through the vertex without intravenous contrast. RADIATION DOSE REDUCTION: This exam was performed according to the departmental dose-optimization program which includes automated exposure control, adjustment of the mA and/or kV according to patient size and/or use of iterative reconstruction technique. COMPARISON:  10/16/2022 FINDINGS: Brain: No evidence of acute infarction, hemorrhage, hydrocephalus, extra-axial collection or mass lesion/mass effect. Extensive  low-density changes within the periventricular and subcortical white matter most compatible with chronic microvascular ischemic change. Mild-moderate diffuse cerebral volume loss. Vascular: Atherosclerotic calcifications involving the large vessels of the skull base. No unexpected hyperdense vessel. Skull: Normal. Negative for fracture or focal lesion. Sinuses/Orbits: New partial bilateral mastoid effusions. Paranasal sinuses are clear. Other: None. IMPRESSION: 1. No acute intracranial findings. 2. Chronic microvascular ischemic change and cerebral volume loss. 3. New partial bilateral mastoid effusions. Electronically Signed   By: Davina Poke D.O.   On: 11/23/2022 11:37   DG Chest Port 1 View  Result Date: 11/23/2022 CLINICAL DATA:  Weakness. EXAM: PORTABLE CHEST 1 VIEW COMPARISON:  10/16/2022. FINDINGS: Low lung volumes accentuate the pulmonary vasculature and cardiomediastinal silhouette. Patchy left retrocardiac opacity is favored to represent subsegmental atelectasis. No pleural effusion or pneumothorax. Dorsal column stimulator electrodes project over the T6-T8 levels. IMPRESSION: Patchy left retrocardiac opacity is favored to represent subsegmental atelectasis given low lung volumes. Electronically Signed   By: Emmit Alexanders M.D.   On: 11/23/2022 09:59    Assessment/Plan:   Principal Problem:   Hypoglycemia   Patient Summary: Albert Powell is a 82 y.o. with a pertinent PMH of type 2 DM, hypothyroidism, GERD, CKD 3b,  COPD, HTN, atrial fibrillation, CAD, cognitive impairment, dysphagia who presented with altered mental status and hypoglycemia and admitted for encephalopathy.  Hypoglycemia Noted to have recurrent hypoglycemic episodes. Was on glipizide daily and he has been having poor appetite over last few weeks. Likely cause of hypoglycemia in setting of sulfonylurea along with poor po intake. Remains on D5 0.45 NS given NPO status. SLP recommends barium swallow to further evaluate.  Unable to check morning cortisol after multiple attempts by phlebotomy.  -continue D5 in 0.45 NS at 75 ml/hr -CBG q2h -stopped glipizide -consider repeat AM cortisol tomorrow  Acute encephalopathy Likely underlying vascular dementia Presented with worsening confusion over past week with recent fall. Found to be hypoglycemic which is contributing to his current state. Does have dementia. Head imaging showed chronic vascular changes. No acute hemorrhage noted on CT head.  -addressing hypoglycemia with IV fluids -monitor for electrolyte derangements   Dysphagia Poor appetite Per patient's daughter he has had trouble with eating over the last 6 months. She states it has progressively worsened to the point where he immediately starts gagging with initial swallowing of food/drink. SLP evaluated and recommended barium swallow to further assess. NPO for now.  -NPO for now -f/u barium swallow results  Recent nausea/vomiting with abdominal pain Patient denies any abdominal pain. Daughter notes frequent regurgitation with po intake that has been ongoing. CT a/p did not show any acute abnormality. Does note unchanged right sided ventral hernia with small bowel without evidence of obstruction.   Hyponatremia Na 131. Has been hyponatremic for past few months likely in setting of poor po intake. Appears he was given salt tabs and tolvaptam at [facility]. Unable to assess AM cortisol today, can attempt again if needed. Serum osm was low 272, urine osm 236, urine Na <10 so suspect hypovolemia. Will continue IV fluids given NPO status.  -trend BMP   Anion gap metabolic acidosis, improving Anion gap of 15 and CO2 of 14 on admission that has since improved with IV fluids. Remains afebrile and normal WBC count.     CKD 3b Creatinine and GFR appear to be at baseline.  -monitor renal function  Incidental adrenal mass Right lower lobe pulmonary nodule Adrenal nodule on CT imaging 10/03/22. Repeat CT during  this admission showed unchanged nodule and likely benign adenoma without further f/u. Unable to collect AM cortisol this morning. CT a/p with irregular 5 mm pulmonary nodule on 1/19. Will need outpatient follow up with CT chest.    Hx of hypothyroidism TSH 7.508. On levothyroxine but unclear if he has been able to take it daily. Holding levothyroxine given NPO at this time.   Atrial fibrillation Holding home eliquis 2.5 mg BID and amiodarone 200 mg BID given NPO.    Hx of CAD s/p PCI Hx of HFpEF HTN Most recent EF 55-60%. Minimal LE edema noted on exam. Receiving IV fluids so monitor volume status. Holding home Plavix, Imdur, statin, amlodipine and hydralazine.    [place] Patient has been having worsening confusion, poor PO intake and gradual decline per daughter. Patient currently at [facility] since he requires assistance with his ADLs. Discussed with daughter, [name], today about [place] for the patient. She states he has been declining and looked his worst the past week. Daughter states "he is a dying man". She notes he has been regurgitating all po intake for past few weeks. I expressed my concerns about the patient's decline and poor po intake. Daughter understands and agrees to discuss with palliative care. Discussed  code status and how patient is unable to make his own medical decision at this time. Daughter states patient had agreed to be DNR previously but had told Helene Kelp he is full code. I am concerned given his age and frailty that he would have a difficult time recovering from cardiac arrest. Daughter is in agreement of that and believes DNR would be most appropriate.  -palliative consult -code status to DNR/DNI  Diet: NPO IVF:  d5 in 0.45 NS ,75cc/hr VTE: Enoxaparin Code: Full Family Update: discussed with daughter at bedside today  Dispo: Anticipated discharge to  TBD  pending [place] and medical management  Angelique Blonder, DO Internal Medicine Resident PGY-1 Pager: [phone] Please  contact the on call pager after 5 pm and on weekends at [phone].

## 2022-11-24 NOTE — Progress Notes (Signed)
Mobility Specialist Progress Note:   11/24/22 1300  Mobility  Activity Transferred from chair to bed  Level of Assistance Maximum assist, patient does 25-49%  Assistive Device Stedy  Activity Response Tolerated fair  $Mobility charge 1 Mobility   Pt in chair needing to get back to bed. Complaints of pain but could not indicate where. MaxA to stand in stedy. Left in bed with call bell in reach and all needs met.   Gareth Eagle Naseer Hearn Mobility Specialist Please contact via [employer] or  Rehab Office at [phone]

## 2022-11-24 NOTE — Progress Notes (Signed)
SLP Note  Patient Details Name: [name] MRN: [id number] DOB: May 10, 1941   Results of barium swallow reviewed; pt presented with severe dysmotility, which explains the regurgitation reported and observed yesterday. GI referral recommended in the event he would benefit from dilatation.  SLP will sign off at this time.  Eulalia Ellerman L. Tivis Ringer, MA CCC/SLP Clinical Specialist - Acute Care SLP Acute Rehabilitation Services Office number [phone]    Juan Quam Laurice 11/24/2022, 2:48 PM

## 2022-11-24 NOTE — Progress Notes (Addendum)
Initial Nutrition Assessment  DOCUMENTATION CODES:   Non-severe (moderate) malnutrition in context of chronic illness  INTERVENTION:   If within [place], recommend placement of Cortrak and initiate enteral nutrition: Start Osmolite 1.2 at 25 mL/hr and advance by 10 mL every 12 hours to goal rate of 65 mL/hr. (1560 mL per day) 60 mL ProSource TF20 - Daily Free water flush: 155 mL - QID Provides 1952 kcal, 107 gm protein, and 1899 mL total free water daily.  Monitor magnesium, potassium, and phosphorus BID for at least 3 days, MD to replete as needed, as pt is at risk for refeeding syndrome given malnutrition and poor PO intake PTA. Monitor for diet advancement  NUTRITION DIAGNOSIS:   Moderate Malnutrition related to chronic illness as evidenced by severe muscle depletion, mild fat depletion.  GOAL:   Patient will meet greater than or equal to 90% of their needs  MONITOR:   Diet advancement, Labs, I & O's, Weight trends  REASON FOR ASSESSMENT:   Consult Assessment of nutrition requirement/status  ASSESSMENT:   82 y.o. male presented to the ED with AMS and hypoglycemia. PMH includes T2DM, GERD, CKD IIIb, COPD, HTN, A. Fib., CAD, CHF, and PAD. Pt admitted with hypoglycemia and acute encephalopathy.   3/10 - SLP evaluation, recommend NPO 3/11 - MBS, revealed severe dysmotility   Pt sitting up in chair at time of RD visit. Pt lethargic and does not answer any of RD questions. Pt opened eyes to RD voice and would nod head occasionally. Per H&P, pt has had poor PO intake and trouble eating over the past 6 months.   Reached out to team about placement of Cortrak due to NPO status and ongoing confusion. Team would like to hold at this time and wait for MBS results.   Medications reviewed and include: Protonix, D5 Labs reviewed: Sodium 131, Potassium 3.6, Phosphorus 2.0 (3/10), Magnesium (3/10), Hgb A1c 7.2% (05/14/22), 24 hr CBGs 39-117  NUTRITION - FOCUSED PHYSICAL EXAM:  Flowsheet  Row Most Recent Value  Orbital Region Mild depletion  Upper Arm Region No depletion  Thoracic and Lumbar Region No depletion  Buccal Region Mild depletion  Temple Region Mild depletion  Clavicle Bone Region Unable to assess  Clavicle and Acromion Bone Region Unable to assess  Scapular Bone Region Unable to assess  Dorsal Hand Unable to assess  Patellar Region Severe depletion  Anterior Thigh Region Severe depletion  Posterior Calf Region Severe depletion  Edema (RD Assessment) None   Diet Order:   Diet Order             Diet NPO time specified  Diet effective now                  EDUCATION NEEDS:   Not appropriate for education at this time  Skin:  Skin Assessment: Reviewed RN Assessment  Last BM:  Unknown  Height:  Ht Readings from Last 1 Encounters:  11/23/22 '5\' 9"'$  (1.753 m)   Weight:  Wt Readings from Last 1 Encounters:  11/23/22 83.2 kg   Ideal Body Weight:  72.7 kg  BMI:  Body mass index is 27.09 kg/m.  Estimated Nutritional Needs:  Kcal:  1900-2100 Protein:  95-115 grams Fluid:  >/= 1.9 L   Hermina Barters RD, LDN Clinical Dietitian See [facility] for contact information.

## 2022-11-24 NOTE — Hospital Course (Signed)
Albert Powell is a 82 y.o. with a pertinent PMH of type 2 DM, hypothyroidism, GERD, CKD 3b, COPD, HTN, atrial fibrillation, CAD, cognitive impairment, dysphagia who presented with altered mental status and hypoglycemia and admitted for encephalopathy.   Comfort Care Patient has been having worsening confusion, poor PO intake and gradual decline for several months. Discussed with daughter, Albert Powell, and she believes his quality of life is poor given his co-morbidities and multiple hospitalizations. He has not been improving at [facility]. Daughter states "he is a dying man". Discussed with daughter on 3/11 regarding barium swallow results of severe dysmotility. He is unable to swallow food and has been regurgitating po intake which has been ongoing. She is unsure if patient would want GI evaluation given his multiple co-morbidities and quality of life. Palliative team engaged and discussed with daughter. Daughter agreed to transition patient to full comfort care. Currently DNR. Will discharge patient to residential hospice facility.    Acute encephalopathy Likely underlying vascular dementia Presented with worsening confusion over past week with recent fall. Multiple bruises noted on admission. Found to be hypoglycemic which is likely contributing to his current state. Does have history of progressive dementia. Head imaging showed chronic vascular changes. No acute hemorrhage noted on CT head.    Dysphagia Poor appetite Per patient's daughter he has had trouble with eating over the last 6 months. She states it has progressively worsened to the point where he immediately starts gagging with initial swallowing of food/drink. Barium swallow showed severe dysmotility and delayed transit. SLP recommend possible GI consult. Discussed with daughter and she does not think patient would want to pursue it given his gradual decline in health.    Hypoglycemia with Type 2 Diabetes mellitus Noted to have recurrent  hypoglycemic episodes. Was on glipizide daily and he has been having poor appetite. Likely cause of hypoglycemia in setting of sulfonylurea along with poor po intake. No further hypoglycemia after IV fluids.    Hyponatremia Has been hyponatremic for past few months likely in setting of poor po intake. Appears he was given salt tabs and tolvaptam at [facility]. AM cortisol normal. Serum osm was low 272, urine osm 236, urine Na <10 so suspect hypovolemia. Sodium has improved with IV fluids.    Anion gap metabolic acidosis AG 15 and bicarb 14 on admission, received IV fluids, with slight improvement AG 13 and bicarb 15. Likely in setting of poor po intake leading to ketoacidosis.   CKD 3b Creatinine and GFR appear to be at baseline.  Incidental adrenal mass Right lower lobe pulmonary nodule Adrenal nodule on CT imaging 10/03/22. Repeat CT during this admission showed unchanged nodule and likely benign adenoma without further f/u. CT a/p with irregular 5 mm pulmonary nodule on 1/19.    Hx of hypothyroidism TSH 7.508. Medication list includes levothyroxine but unclear if he has been able to take it daily.   Atrial fibrillation Patient on eliquis 2.5 mg BID and amiodarone 200 mg BID. Medications held given NPO status.    Hx of CAD s/p PCI Hx of HFpEF HTN Most recent EF 55-60%. Minimal LE edema noted on exam. Do not suspect CHF exacerbation at this time. Home medications were held given NPO status.

## 2022-11-24 NOTE — Progress Notes (Signed)
Inpatient Diabetes Program Recommendations  AACE/ADA: New Consensus Statement on Inpatient Glycemic Control (2015)  Target Ranges:  Prepandial:   less than 140 mg/dL      Peak postprandial:   less than 180 mg/dL (1-2 hours)      Critically ill patients:  140 - 180 mg/dL   Lab Results  Component Value Date   GLUCAP 103 (H) 11/24/2022   HGBA1C 7.2 (H) 05/14/2022    Review of Glycemic Control  Latest Reference Range & Units 11/24/22 02:09 11/24/22 03:39 11/24/22 06:06 11/24/22 08:39 11/24/22 10:31  Glucose-Capillary 70 - 99 mg/dL 105 (H) 117 (H) 84 96 103 (H)   Diabetes history: DM  Outpatient Diabetes medications:  Glucotrol 5 mg daily Current orders for Inpatient glycemic control:  None  Inpatient Diabetes Program Recommendations:    Note patient admitted with low blood sugars.  Glucotrol held and will not be restarted at d/c per MD's note.  If blood sugars do rise, may need DPP-4 such as Tradjenta 5 mg (does not cause low blood sugars).  Will follow.   Thanks,  Adah Perl, RN, BC-ADM Inpatient Diabetes Coordinator Pager [phone]  (8a-5p)

## 2022-11-25 DIAGNOSIS — R1314 Dysphagia, pharyngoesophageal phase: Secondary | ICD-10-CM

## 2022-11-25 DIAGNOSIS — Z515 Encounter for palliative care: Secondary | ICD-10-CM

## 2022-11-25 DIAGNOSIS — E440 Moderate protein-calorie malnutrition: Secondary | ICD-10-CM | POA: Diagnosis not present

## 2022-11-25 DIAGNOSIS — R638 Other symptoms and signs concerning food and fluid intake: Secondary | ICD-10-CM | POA: Diagnosis not present

## 2022-11-25 DIAGNOSIS — E11649 Type 2 diabetes mellitus with hypoglycemia without coma: Secondary | ICD-10-CM

## 2022-11-25 DIAGNOSIS — E162 Hypoglycemia, unspecified: Secondary | ICD-10-CM | POA: Diagnosis not present

## 2022-11-25 DIAGNOSIS — G9340 Encephalopathy, unspecified: Secondary | ICD-10-CM | POA: Diagnosis not present

## 2022-11-25 DIAGNOSIS — Z7189 Other specified counseling: Secondary | ICD-10-CM

## 2022-11-25 LAB — GLUCOSE, CAPILLARY
Glucose-Capillary: 132 mg/dL — ABNORMAL HIGH (ref 70–99)
Glucose-Capillary: 132 mg/dL — ABNORMAL HIGH (ref 70–99)
Glucose-Capillary: 124 mg/dL — ABNORMAL HIGH (ref 70–99)
Glucose-Capillary: 134 mg/dL — ABNORMAL HIGH (ref 70–99)
Glucose-Capillary: 130 mg/dL — ABNORMAL HIGH (ref 70–99)
Glucose-Capillary: 129 mg/dL — ABNORMAL HIGH (ref 70–99)

## 2022-11-25 LAB — BASIC METABOLIC PANEL
Anion gap: 13 (ref 5–15)
BUN: 7 mg/dL — ABNORMAL LOW (ref 8–23)
CO2: 15 mmol/L — ABNORMAL LOW (ref 22–32)
Calcium: 7.7 mg/dL — ABNORMAL LOW (ref 8.9–10.3)
Chloride: 105 mmol/L (ref 98–111)
Creatinine, Ser: 1.56 mg/dL — ABNORMAL HIGH (ref 0.61–1.24)
GFR, Estimated: 45 mL/min — ABNORMAL LOW (ref >60)
Glucose, Bld: 128 mg/dL — ABNORMAL HIGH (ref 70–99)
Potassium: 3.5 mmol/L (ref 3.5–5.1)
Sodium: 133 mmol/L — ABNORMAL LOW (ref 135–145)

## 2022-11-25 LAB — MAGNESIUM: Magnesium: 1.7 mg/dL (ref 1.7–2.4)

## 2022-11-25 LAB — PHOSPHORUS: Phosphorus: 3.9 mg/dL (ref 2.5–4.6)

## 2022-11-25 LAB — CORTISOL: Cortisol, Plasma: 15.3 ug/dL

## 2022-11-25 MED ORDER — POLYVINYL ALCOHOL 1.4 % OP SOLN: 1.0000 [drp] | Freq: Four times a day (QID) | OPHTHALMIC | Status: DC | PRN

## 2022-11-25 MED ORDER — POTASSIUM CHLORIDE 10 MEQ/100ML IV SOLN
10.0000 meq | INTRAVENOUS | Status: AC
  Administered 2022-11-25 (×4): 10 meq via INTRAVENOUS
  Filled 2022-11-25 (×4): qty 100

## 2022-11-25 MED ORDER — GLYCOPYRROLATE 0.2 MG/ML IJ SOLN: 0.2000 mg | INTRAMUSCULAR | Status: DC | PRN

## 2022-11-25 MED ORDER — HALOPERIDOL LACTATE 5 MG/ML IJ SOLN: 0.5000 mg | INTRAMUSCULAR | Status: DC | PRN

## 2022-11-25 MED ORDER — ONDANSETRON HCL 4 MG/2ML IJ SOLN
4.0000 mg | Freq: Four times a day (QID) | INTRAMUSCULAR | Status: DC | PRN
  Administered 2022-11-26: 4 mg via INTRAVENOUS
  Filled 2022-11-25: qty 2

## 2022-11-25 MED ORDER — LORAZEPAM 2 MG/ML IJ SOLN: 1.0000 mg | INTRAMUSCULAR | Status: DC | PRN

## 2022-11-25 MED ORDER — BIOTENE DRY MOUTH MT LIQD: 15.0000 mL | OROMUCOSAL | Status: DC | PRN

## 2022-11-25 MED ORDER — DEXTROSE-NACL 5-0.45 % IV SOLN: INTRAVENOUS | Status: DC

## 2022-11-25 MED ORDER — MAGNESIUM SULFATE 2 GM/50ML IV SOLN
2.0000 g | Freq: Once | INTRAVENOUS | Status: AC
  Administered 2022-11-25: 2 g via INTRAVENOUS
  Filled 2022-11-25: qty 50

## 2022-11-25 MED ORDER — HYDROMORPHONE HCL 1 MG/ML IJ SOLN
0.5000 mg | INTRAMUSCULAR | Status: DC
  Administered 2022-11-25: 0.5 mg via INTRAVENOUS
  Filled 2022-11-25: qty 0.5

## 2022-11-25 MED ORDER — ONDANSETRON 4 MG PO TBDP: 4.0000 mg | ORAL_TABLET | Freq: Four times a day (QID) | ORAL | Status: DC | PRN

## 2022-11-25 NOTE — Consult Note (Signed)
Palliative Care Consult Note                                  Date: 11/25/2022   Patient Name: Albert Powell  DOB: October 30, 1940  MRN: [id number]  Age / Sex: 82 y.o., male  PCP: Patient, No Pcp Per Referring Physician: Lucious Groves, DO  Reason for Consultation: Establishing goals of care  HPI/Patient Profile: 82 y.o. male  with past medical history of DM type 2, CKD 3B, COPD, atrial fibrillation, CAD, GERD, HTN, hypothyroidism,, cognitive impairment, and dysphagia who presented to the ED on 11/23/2022 with altered mental status and hypoglycemia.  He was admitted to the internal medicine service with encephalopathy.  Palliative Medicine was consulted for goals of care.   Subjective:   I have reviewed medical records including progress notes, labs and imaging, and assessed the patient at bedside. He is awake; oriented to self and place.  He tells me he is in the hospital due to "trouble breathing".  I spoke with his daughter Albert Powell by phone to discuss diagnosis, prognosis, GOC, EOL wishes, disposition, and options.  I introduced Palliative Medicine as specialized medical care for people living with serious illness. It focuses on providing relief from the symptoms and stress of a serious illness.   A brief life review was discussed.  Patient is originally from [place].  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  Albert Powell is his only child.  According to Albert Powell,  his wife Albert Powell has cognitive deficits and is mentally unstable.   We discussed patient's current illness and what it means in the larger context of his ongoing co-morbidities. Current clinical status was reviewed. Natural disease trajectory of chronic illness and frailty was discussed.  Created space and opportunity for Albert Powell to explore thoughts and feelings regarding her father's current medical situation. Values and goals of care were attempted to be elicited.  Albert Powell  shares that her father has "been miserable" and has stated "I wish I was dead".  She shares that it is difficult to see him "live like this".  The difference between full scope medical intervention and comfort care was considered.  I introduced the concept of a comfort path, which means de-escalating full scope medical interventions and allowing a natural course to occur. Discussed that the goal is comfort and dignity rather than cure/prolonging life.   Albert Powell is very clear she wishes care to focus on her father's comfort and dignity. She is agreeable to transition to full comfort care and request a referral to [facility].  Questions and concerns addressed.  Emotional support provided.   Review of Systems  Respiratory:  Positive for shortness of breath.     Objective:   Primary Diagnoses: Present on Admission:  Hypoglycemia   Physical Exam Vitals reviewed.  Constitutional:      General: He is not in acute distress.    Appearance: He is ill-appearing.  Pulmonary:     Effort: No respiratory distress.  Neurological:     Mental Status: He is alert.     Motor: Weakness present.  Psychiatric:        Cognition and Memory: Cognition is impaired.   Oriented to person and place  Vital Signs:  BP (!) 157/81 (BP Location: Left Arm)   Pulse 61   Temp 97.8 F (36.6 C) (Oral)   Resp 11   Ht '5\' 9"'$  (1.753  m)   Wt 83.2 kg   SpO2 99%   BMI 27.09 kg/m   Palliative Assessment/Data: PPS 20%     Assessment & Plan:   SUMMARY OF RECOMMENDATIONS   Transition to full comfort measures SLP please reassess for comfort feeds recommendations Referral to residential hospice facility - Albert Powell will continue to support  Symptom Management:  Scheduled Dilaudid 0.5 mg every 4 hours Lorazepam (ATIVAN) prn for anxiety Haloperidol (HALDOL) prn for agitation  Glycopyrrolate (ROBINUL) for excessive secretions Ondansetron (ZOFRAN) prn for nausea Polyvinyl alcohol (LIQUIFILM TEARS) prn  for dry eyes Antiseptic oral rinse (BIOTENE) prn for dry mouth   Primary Decision Maker: Daughter/Albert Powell  Code Status/Advance Care Planning: DNR  Prognosis:  < 2 weeks   Thank you for allowing [place] to participate in the care of Albert Powell  MDM - [place]  Signed by: Elie Confer, NP Palliative Medicine Team  Team Phone # [phone]  For individual providers, please see AMION

## 2022-11-25 NOTE — Progress Notes (Addendum)
HD#2 Subjective:   Summary: Albert Powell is a 82 y.o. male with PMH of  type 2 DM, hypothyroidism, GERD, CKD 3b, COPD, HTN, atrial fibrillation, CAD, cognitive impairment, dysphagia who presented with altered mental status and hypoglycemia and admitted for encephalopathy.   Overnight Events: none  Patient sitting up in bed this morning. Alert and oriented to self, place and time. He is unclear why he is in the hospital. States his mouth is dry. Denies any pain or other discomfort.   Objective:  Vital signs in last 24 hours: Vitals:   11/24/22 1710 11/24/22 2010 11/24/22 2346 11/25/22 0319  BP: (!) 149/76 (!) 168/72 (!) 158/86 (!) 159/71  Pulse: 71 68 64 66  Resp: '15 19 16 16  '$ Temp: 98.1 F (36.7 C)  97.9 F (36.6 C) 98.4 F (36.9 C)  TempSrc: Oral  Oral Oral  SpO2: 98% 100% 99% 100%  Weight:      Height:       Supplemental O2: Room Air SpO2: 100 % O2 Flow Rate (L/min): 0 L/min   Physical Exam:  Constitutional: chronically ill appearing male lying in bed, in no acute distress Cardiovascular: regular rate  Pulmonary/Chest: normal work of breathing on RA MSK: normal bulk and tone Neurological: alert & oriented to self, place and time Skin: dry skin with multiple ecchymosis noted on bilateral forearms and LE and right hip, erythema and possible skin breakdown of right buttock  Filed Weights   11/23/22 0943  Weight: 83.2 kg     Intake/Output Summary (Last 24 hours) at 11/25/2022 0639 Last data filed at 11/24/2022 2010 Gross per 24 hour  Intake --  Output 800 ml  Net -800 ml    Net IO Since Admission: -1,682.5 mL [11/25/22 0639]  Pertinent Labs:    Latest Ref Rng & Units 11/24/2022   12:57 AM 11/23/2022   10:39 AM 11/23/2022    9:29 AM  CBC  WBC 4.0 - 10.5 K/uL 9.2   10.4   Hemoglobin 13.0 - 17.0 g/dL 16.5  12.2  15.5   Hematocrit 39.0 - 52.0 % 46.5  36.0  45.0   Platelets 150 - 400 K/uL 183   254        Latest Ref Rng & Units 11/25/2022    1:01 AM  11/24/2022   12:57 AM 11/23/2022    6:04 PM  CMP  Glucose 70 - 99 mg/dL 128  104  51   BUN 8 - 23 mg/dL '7  9  11   '$ Creatinine 0.61 - 1.24 mg/dL 1.56  1.39  1.45   Sodium 135 - 145 mmol/L 133  131  132   Potassium 3.5 - 5.1 mmol/L 3.5  3.6  4.3   Chloride 98 - 111 mmol/L 105  101  101   CO2 22 - 32 mmol/L '15  17  14   '$ Calcium 8.9 - 10.3 mg/dL 7.7  8.6  8.5     Imaging: DG ESOPHAGUS W SINGLE CM (SOL OR THIN BA)  Result Date: 11/24/2022 CLINICAL DATA:  Patient with complaints of dysphagia and regurgitation. EXAM: ESOPHAGUS/BARIUM SWALLOW TECHNIQUE: Single contrast examination was performed using thin liquid barium. This exam was performed by Albert Dryer, NP, and was supervised and interpreted by Dr. Lin Powell. FLUOROSCOPY: Radiation Exposure Index (as provided by the fluoroscopic device): 9.70 mGy Kerma COMPARISON:  Chest radiographs 11/23/2022.  Abdominal CT 11/23/2022. FINDINGS: Swallowing: No vestibular penetration or aspiration seen. Pharynx: Unremarkable. Esophagus: Study was performed in the  supine and semi erect positions. Patient was only able to swallow small sips of barium. There is resulting limited assessment of the esophageal mucosa. No focal mucosal abnormalities are identified. Barium did pass into the proximal stomach. Esophageal motility: Severe dysmotility with long delay in transit of barium to the stomach. Hiatal Hernia: Limited study.  Unable to fully assess. Gastroesophageal reflux: None visualized. Ingested 94m barium tablet: Not given. Other: None. IMPRESSION: Very limited study with patient able to only complete one swallow of barium. Severe dysmotility with long delay in transit of barium into the stomach. Consider speech therapy evaluation. Read by: JSoyla Dryer NP Electronically Signed   By: WRichardean SaleM.D.   On: 11/24/2022 14:37    Assessment/Plan:   Principal Problem:   Hypoglycemia Active Problems:   Hypophosphatemia   Decreased oral intake    Malnutrition of moderate degree   Patient Summary: Albert THRONis a 82y.o. with a pertinent PMH of type 2 DM, hypothyroidism, GERD, CKD 3b, COPD, HTN, atrial fibrillation, CAD, cognitive impairment, dysphagia who presented with altered mental status and hypoglycemia and admitted for encephalopathy.  Hypoglycemia with Type 2 Diabetes mellitus Noted to have recurrent hypoglycemic episodes. Was on glipizide daily and he has been having poor appetite over last few weeks. Likely cause of hypoglycemia in setting of sulfonylurea along with poor po intake. Remains on D5 0.45 NS given NPO status. CBG have been in 120-130s. Barium swallow showed severe dysmotility and delayed transit. AM cortisol normal. Monitor CBGs given NPO and remain on IV fluids.  -continue D5 0.45 NS at 75 ml/hr -CBG q6h   Acute encephalopathy Likely underlying vascular dementia Presented with worsening confusion over past week with recent fall. Found to be hypoglycemic which is contributing to his current state. Does have dementia. Head imaging showed chronic vascular changes. No acute hemorrhage noted on CT head. No further hypoglycemia since on IV fluids. Oriented to person, place and time today. Still confused over the recent events. Monitor for electrolyte derangements.    Dysphagia Poor appetite Per patient's daughter he has had trouble with eating over the last 6 months. She states it has progressively worsened to the point where he immediately starts gagging with initial swallowing of food/drink. Barium swallow showed severe dysmotility and delayed transit. SLP recommend possible GI consult. Discussed with daughter and she is uncertain if that will improve his quality of life given he has had steady decline.  -remains NPO -IV fluids for hydration  Hyponatremia Na better 133. Has been hyponatremic for past few months likely in setting of poor po intake. [facility]  cortisol normal. Serum osm was low 272, urine osm 236, urine Na <10 so suspect hypovolemia. Will continue IV fluids given NPO status.  -trend BMP    Anion gap metabolic acidosis AG 15 and bicarb 14 on admission, received IV fluids, today AG 13 and bicarb 15. Likely in  setting of poor po intake leading to ketoacidosis.  -trend BMP    CKD 3b Creatinine and GFR appear to be at baseline. Will monitor renal function.   Incidental adrenal mass Right lower lobe pulmonary nodule Adrenal nodule on CT imaging 10/03/22. Repeat CT during this admission showed unchanged nodule and likely benign adenoma without further f/u. Unable to collect AM cortisol this morning. CT a/p with irregular 5 mm pulmonary nodule on 1/19. Will need outpatient follow up with CT chest.    Hx of hypothyroidism TSH 7.508.  On levothyroxine but unclear if he has been able to take it daily. Holding levothyroxine given NPO at this time.   Atrial fibrillation Holding home eliquis 2.5 mg BID and amiodarone 200 mg BID given NPO. On therapeutic Lovenox for now.    Hx of CAD s/p PCI Hx of HFpEF HTN Most recent EF 55-60%. Minimal LE edema noted on exam. Receiving IV fluids so monitor volume status. Holding home Plavix, Imdur, statin, amlodipine and hydralazine.    [place] Patient has been having worsening confusion, poor PO intake and gradual decline per daughter. Patient currently at [facility] since he requires assistance with his ADLs. Discussed with daughter, [name], about [place] for the patient. She states he has been declining and looked his worst the past week. Daughter states "he is a dying man". She notes he has been regurgitating all po intake for past few weeks. I expressed my concerns about the patient's decline and poor po intake. Daughter understands and agrees to discuss with palliative care. Discussed code status and how patient is unable to make his own medical decision at this time. Daughter states patient had agreed to be DNR  previously but had told Helene Kelp he is full code. I am concerned given his age and frailty that he would have a difficult time recovering from cardiac arrest. Daughter is in agreement of that and believes DNR would be most appropriate. Discussed with daughter on 3/11 regarding barium swallow results of severe dysmotility. She is unsure if patient would want GI evaluation given his multiple co-morbidities and quality of life. She states he is unable to walk, requires assistance with everyday tasks and believes he would not want to continue living this way.  -palliative consult -continue [place] discussions -code status to DNR/DNI  Diet: NPO IVF:  d5 in 0.45 NS ,75cc/hr VTE: Enoxaparin Code: DNR Family Update: discussed with daughter at bedside yesterday  Dispo: Anticipated discharge to  TBD  pending [place] and medical management  Angelique Blonder, DO Internal Medicine Resident PGY-1 Pager: [phone] Please contact the on call pager after 5 pm and on weekends at [phone].

## 2022-11-26 ENCOUNTER — Telehealth (HOSPITAL_COMMUNITY): Payer: Self-pay | Admitting: Licensed Clinical Social Worker

## 2022-11-26 ENCOUNTER — Other Ambulatory Visit (HOSPITAL_COMMUNITY): Payer: Self-pay | Admitting: Emergency Medicine

## 2022-11-26 DIAGNOSIS — R1314 Dysphagia, pharyngoesophageal phase: Secondary | ICD-10-CM | POA: Diagnosis not present

## 2022-11-26 DIAGNOSIS — E11649 Type 2 diabetes mellitus with hypoglycemia without coma: Secondary | ICD-10-CM | POA: Diagnosis not present

## 2022-11-26 DIAGNOSIS — G9340 Encephalopathy, unspecified: Secondary | ICD-10-CM | POA: Diagnosis not present

## 2022-11-26 DIAGNOSIS — E440 Moderate protein-calorie malnutrition: Secondary | ICD-10-CM

## 2022-11-26 MED ORDER — ONDANSETRON 4 MG PO TBDP: 4.0000 mg | ORAL_TABLET | Freq: Four times a day (QID) | ORAL | 0 refills | Status: AC | PRN

## 2022-11-26 MED ORDER — HALOPERIDOL LACTATE 5 MG/ML IJ SOLN: 0.5000 mg | INTRAMUSCULAR | 0 refills | Status: AC | PRN

## 2022-11-26 MED ORDER — POLYVINYL ALCOHOL 1.4 % OP SOLN: 1.0000 [drp] | Freq: Four times a day (QID) | OPHTHALMIC | 0 refills | Status: AC | PRN

## 2022-11-26 MED ORDER — ONDANSETRON HCL 4 MG/2ML IJ SOLN: 4.0000 mg | Freq: Four times a day (QID) | INTRAMUSCULAR | 0 refills | Status: AC | PRN

## 2022-11-26 MED ORDER — HYDROMORPHONE HCL 1 MG/ML IJ SOLN: 0.5000 mg | INTRAMUSCULAR | 0 refills | Status: AC

## 2022-11-26 MED ORDER — BIOTENE DRY MOUTH MT LIQD: 15.0000 mL | OROMUCOSAL | 0 refills | Status: AC | PRN

## 2022-11-26 MED ORDER — LORAZEPAM 2 MG/ML IJ SOLN: 1.0000 mg | INTRAMUSCULAR | 0 refills | Status: AC | PRN

## 2022-11-26 MED ORDER — GLYCOPYRROLATE 0.2 MG/ML IJ SOLN: 0.2000 mg | INTRAMUSCULAR | 0 refills | Status: AC | PRN

## 2022-11-26 NOTE — TOC Transition Note (Signed)
Transition of Care (TOC) - CM/SW Discharge Note Marvetta Gibbons RN, BSN Transitions of Care Unit 4E- RN Case Manager See Treatment Team for direct phone #   Patient Details  Name: TAEGAN BODIFORD MRN: [id number] Date of Birth: 07-09-1941  Transition of Care [facility]) CM/SW Contact:  Dawayne Patricia, RN Phone Number: 11/26/2022, 3:16 PM   Clinical Narrative:    Pt has been approved for admission to [place] [facility] and has bed available today per [facility] with [facility].   Paperwork has been completed by Granddaughter and plan will be to transition pt to [facility] today. MD has completed d/c and signed GOLD DNR.   [facility] called for transport- per dispatch there are 7 patients ahead on list - ETA around 2hrs or more.   Bedside RN has number to call report to [facility]. [phone]   Paperwork placed on chart for [facility] along with GOLD DNR.   No further TOC needs noted.     Final next level of care: [place] Barriers to Discharge: Barriers Resolved   Patient Goals and CMS Choice CMS Medicare.gov Compare Post Acute Care list provided to:: Patient Represenative (must comment) Choice offered to / list presented to : Adult Children  Discharge Placement                 [place]        Discharge Plan and Services Additional resources added to the After Visit Summary for   In-house Referral: Clinical Social Work Discharge Planning Services: CM Consult Post Acute Care Choice: Hospice          DME Arranged: N/A DME Agency: NA       HH Arranged: NA HH Agency: Hospice and [place] Date [place]: 11/26/22   Representative spoke with at [place]: [place] Determinants of Health ([place]) Interventions SDOH Screenings   Food Insecurity: No Food Insecurity (10/03/2022)  Housing: Low Risk  (10/03/2022)  Transportation Needs: Unmet Transportation Needs (10/08/2022)   Utilities: Not At Risk (10/03/2022)  Alcohol Screen: Low Risk  (06/03/2022)  Depression (PHQ2-9): High Risk (08/14/2022)  Financial Resource Strain: Low Risk  (06/03/2022)  Tobacco Use: Medium Risk (11/23/2022)     Readmission Risk Interventions    11/26/2022    3:16 PM  Readmission Risk Prevention Plan  Transportation Screening Complete  Medication Review (RN Care Manager) Complete  PCP or Specialist appointment within 3-5 days of discharge Not Complete  PCP/Specialist Appt Not Complete comments Hospice care at [facility] or [place] Complete  SW Recovery Care/Counseling Consult Complete  Palliative Care Screening Complete  [place] Not Applicable

## 2022-11-26 NOTE — Progress Notes (Signed)
HD#3 Subjective:   Summary: Albert Powell is a 82 y.o. male with PMH of  type 2 DM, hypothyroidism, GERD, CKD 3b, COPD, HTN, atrial fibrillation, CAD, cognitive impairment, dysphagia who presented with altered mental status and hypoglycemia and admitted for encephalopathy.   Overnight Events: none  Patient is awake. Denies any pain or discomfort. Has been able to take a few sips of water.    Objective:  Vital signs in last 24 hours: Vitals:   11/25/22 0754 11/25/22 1144 11/25/22 1618 11/25/22 2021  BP: (!) 157/81   132/87  Pulse: 61   74  Resp: 11   16  Temp: 97.9 F (36.6 C) 97.9 F (36.6 C) 97.8 F (36.6 C) 97.8 F (36.6 C)  TempSrc: Oral Oral Oral Oral  SpO2: 99%   94%  Weight:      Height:       Supplemental O2: Room Air SpO2: 94 % O2 Flow Rate (L/min): 0 L/min   Physical Exam:  Constitutional: chronically ill appearing male lying in bed comfortably, in no acute distress Cardiovascular: regular rate  Pulmonary/Chest: normal work of breathing on RA MSK: normal bulk and tone Neurological: awake and alert Skin: dry skin with multiple ecchymosis noted on bilateral forearms and LE and right hip  Filed Weights   11/23/22 0943  Weight: 83.2 kg     Intake/Output Summary (Last 24 hours) at 11/26/2022 0726 Last data filed at 11/25/2022 1546 Gross per 24 hour  Intake 450 ml  Output 950 ml  Net -500 ml    Net IO Since Admission: -2,182.5 mL [11/26/22 0726]  Pertinent Labs:    Latest Ref Rng & Units 11/24/2022   12:57 AM 11/23/2022   10:39 AM 11/23/2022    9:29 AM  CBC  WBC 4.0 - 10.5 K/uL 9.2   10.4   Hemoglobin 13.0 - 17.0 g/dL 16.5  12.2  15.5   Hematocrit 39.0 - 52.0 % 46.5  36.0  45.0   Platelets 150 - 400 K/uL 183   254        Latest Ref Rng & Units 11/25/2022    1:01 AM 11/24/2022   12:57 AM 11/23/2022    6:04 PM  CMP  Glucose 70 - 99 mg/dL 128  104  51   BUN 8 - 23 mg/dL '7  9  11   '$ Creatinine 0.61 - 1.24 mg/dL 1.56  1.39  1.45   Sodium 135  - 145 mmol/L 133  131  132   Potassium 3.5 - 5.1 mmol/L 3.5  3.6  4.3   Chloride 98 - 111 mmol/L 105  101  101   CO2 22 - 32 mmol/L '15  17  14   '$ Calcium 8.9 - 10.3 mg/dL 7.7  8.6  8.5     Imaging: No results found.  Assessment/Plan:   Principal Problem:   Hypoglycemia Active Problems:   Hypophosphatemia   Decreased oral intake   Malnutrition of moderate degree   Pharyngoesophageal dysphagia   Patient Summary: Albert Powell is a 82 y.o. with a pertinent PMH of type 2 DM, hypothyroidism, GERD, CKD 3b, COPD, HTN, atrial fibrillation, CAD, cognitive impairment, dysphagia who presented with altered mental status and hypoglycemia and admitted for encephalopathy.  Comfort Care Patient has been having worsening confusion, poor PO intake and gradual decline for several months. Discussed with daughter, [name], and she believes his quality of life is poor given his co-morbidities and multiple hospitalizations. He has not been improving  at [facility]. Daughter states "he is a dying man". Discussed with daughter on 3/11 regarding barium swallow results of severe dysmotility. He is unable to swallow food and has been regurgitating po intake which has been ongoing. She is unsure if patient would want GI evaluation given his multiple co-morbidities and quality of life. Palliative team engaged and discussed with daughter. Agreed to transition patient to full comfort care. Currently DNR. We are pending referral to [facility] given his prognosis is <2 weeks.  -appreciate palliative care assistance -full comfort care -check with SLP for comfort feeds -pending referral to [facility]   Acute encephalopathy Likely underlying vascular dementia Presented with worsening confusion over past week with recent fall. Found to be hypoglycemic which is contributing to his current state. Does have dementia. Head imaging showed chronic vascular changes. No acute hemorrhage noted on CT head. No further hypoglycemia  after IV fluids.    Dysphagia Poor appetite Per patient's daughter he has had trouble with eating over the last 6 months. She states it has progressively worsened to the point where he immediately starts gagging with initial swallowing of food/drink. Barium swallow showed severe dysmotility and delayed transit. SLP recommend possible GI consult. Discussed with daughter and she does not think patient would want to pursue it given his steady decline.  Hypoglycemia with Type 2 Diabetes mellitus Noted to have recurrent hypoglycemic episodes. Was on glipizide daily and he has been having poor appetite. Likely cause of hypoglycemia in setting of sulfonylurea along with poor po intake.     Diet:  clear liquid IVF: None VTE: None Code: DNR Family Update: daughter has been updated and talked with palliative care yesterday  Dispo: Anticipated discharge to  residential hospice facility  pending.  Angelique Blonder, DO Internal Medicine Resident PGY-1 Pager: [phone] Please contact the on call pager after 5 pm and on weekends at [phone].

## 2022-11-26 NOTE — Progress Notes (Signed)
Pt discharged to [facility] via [place]. Pt left with all belongings.

## 2022-11-26 NOTE — Progress Notes (Signed)
[facility]) Hospice hospital liaison note   Referral received for [facility]. Met with patient in room and talked with daughter by phone. [facility] is able to accept patient this afternoon once consents are complete.    RN staff, you may call report at any time to [phone], room is assigned when report is called.  Please leave IV intact.    Please send completed DNR with patient.   Updated attending and [facility] manager via [place]. Thank you for the opportunity to participate in this patient's care Jhonnie Garner, BSN, RN Hospice nurse liaison [phone]

## 2022-11-26 NOTE — Progress Notes (Signed)
Albert Powell is being discharged from paramedicine program due to decline in health which has led him to be transferred from [facility] and admitted to [place] at [facility].      [name], [place] 11/26/2022

## 2022-11-26 NOTE — Progress Notes (Signed)
Report given to receiving nurse at [facility]

## 2022-11-26 NOTE — Telephone Encounter (Signed)
H&V Care Navigation CSW Progress Note  Pt DC'd from [facility] as he going to inpatient hospice facility.   SDOH Screenings   Food Insecurity: No Food Insecurity (10/03/2022)  Housing: Low Risk  (10/03/2022)  Transportation Needs: Unmet Transportation Needs (10/08/2022)  Utilities: Not At Risk (10/03/2022)  Alcohol Screen: Low Risk  (06/03/2022)  Depression (PHQ2-9): High Risk (08/14/2022)  Financial Resource Strain: Low Risk  (06/03/2022)  Tobacco Use: Medium Risk (11/23/2022)    [name], LCSW Clinical Social Worker [facility] Desk#: [phone] Cell#: [phone]

## 2022-11-26 NOTE — Discharge Summary (Signed)
Name: Albert Powell MRN: [id number] DOB: 07-19-1941 82 y.o. PCP: Patient, No Pcp Per  Date of Admission: 11/23/2022  9:22 AM Date of Discharge: 11/26/2022 Attending Physician: Dr. Angelia Mould  Discharge Diagnosis: Principal Problem:   Hypoglycemia Active Problems:   Carotid artery disease ([name])   PAD (peripheral artery disease) ([name])   Paroxysmal atrial fibrillation ([name])   Stage 3b chronic kidney disease (CKD) ([place])   Type 2 diabetes mellitus with chronic kidney disease, without long-term current use of insulin ([name])   Hypothyroidism   Hypophosphatemia   Malnutrition of moderate degree   Pharyngoesophageal dysphagia    Discharge Medications: Allergies as of 11/26/2022       Reactions   Fentanyl Other (See Comments)   Behavioral changes   Gabapentin Swelling   Lisinopril Other (See Comments)   Unknown reaction   Lyrica [pregabalin] Other (See Comments)   Unknown reaction   Metformin Diarrhea   Propofol Other (See Comments)   Heart rate dropped        Medication List     STOP taking these medications    amiodarone 200 MG tablet Commonly known as: PACERONE   amLODipine 10 MG tablet Commonly known as: NORVASC   atorvastatin 40 MG tablet Commonly known as: LIPITOR   clopidogrel 75 MG tablet Commonly known as: PLAVIX   Eliquis 2.5 MG Tabs tablet Generic drug: apixaban   Ensure Clear Liqd   glipiZIDE 5 MG tablet Commonly known as: GLUCOTROL   hydrALAZINE 100 MG tablet Commonly known as: APRESOLINE   isosorbide mononitrate 120 MG 24 hr tablet Commonly known as: IMDUR   levothyroxine 25 MCG tablet Commonly known as: SYNTHROID   magnesium hydroxide 400 MG/5ML suspension Commonly known as: MILK OF MAGNESIA   multivitamin with minerals Tabs tablet   pantoprazole 40 MG tablet Commonly known as: PROTONIX   potassium chloride SA 20 MEQ tablet Commonly known as: KLOR-CON M   sodium chloride 1 g tablet       TAKE these medications     antiseptic oral rinse Liqd Apply 15 mLs topically as needed for dry mouth.   glycopyrrolate 0.2 MG/ML injection Commonly known as: ROBINUL Inject 1 mL (0.2 mg total) into the vein every 4 (four) hours as needed (excessive secretions).   haloperidol lactate 5 MG/ML injection Commonly known as: HALDOL Inject 0.1 mLs (0.5 mg total) into the vein every 4 (four) hours as needed (or delirium).   HYDROmorphone 1 MG/ML injection Commonly known as: DILAUDID Inject 0.5 mLs (0.5 mg total) into the vein every 4 (four) hours.   LORazepam 2 MG/ML injection Commonly known as: ATIVAN Inject 0.5 mLs (1 mg total) into the vein every 4 (four) hours as needed for anxiety.   ondansetron 4 MG disintegrating tablet Commonly known as: ZOFRAN-ODT Take 1 tablet (4 mg total) by mouth every 6 (six) hours as needed for nausea.   ondansetron 4 MG/2ML Soln injection Commonly known as: ZOFRAN Inject 2 mLs (4 mg total) into the vein every 6 (six) hours as needed for nausea. What changed:  how to take this reasons to take this   polyvinyl alcohol 1.4 % ophthalmic solution Commonly known as: LIQUIFILM TEARS Place 1 drop into both eyes 4 (four) times daily as needed for dry eyes.        Disposition and follow-up:   Mr.Windsor W Tornow was discharged from [facility] in Stable condition to residential hospice facility.   Hospital Course by problem list: [name]  is a 82 y.o. with a pertinent PMH of type 2 DM, hypothyroidism, GERD, CKD 3b, COPD, HTN, atrial fibrillation, CAD, cognitive impairment, dysphagia who presented with altered mental status and hypoglycemia and admitted for encephalopathy.   Comfort Care Patient has been having worsening confusion, poor PO intake and gradual decline for several months. Discussed with daughter, [name], and she believes his quality of life is poor given his co-morbidities and multiple hospitalizations. He has not been improving at [facility].  Daughter states "he is a dying man". Discussed with daughter on 3/11 regarding barium swallow results of severe dysmotility. He is unable to swallow food and has been regurgitating po intake which has been ongoing. She is unsure if patient would want GI evaluation given his multiple co-morbidities and quality of life. Palliative team engaged and discussed with daughter. Daughter agreed to transition patient to full comfort care. Currently DNR. Will discharge patient to residential hospice facility.    Acute encephalopathy Likely underlying vascular dementia Presented with worsening confusion over past week with recent fall. Multiple bruises noted on admission. Found to be hypoglycemic which is likely contributing to his current state. Does have history of progressive dementia. Head imaging showed chronic vascular changes. No acute hemorrhage noted on CT head.    Dysphagia Poor appetite Per patient's daughter he has had trouble with eating over the last 6 months. She states it has progressively worsened to the point where he immediately starts gagging with initial swallowing of food/drink. Barium swallow showed severe dysmotility and delayed transit. SLP recommend possible GI consult. Discussed with daughter and she does not think patient would want to pursue it given his gradual decline in health.    Hypoglycemia with Type 2 Diabetes mellitus Noted to have recurrent hypoglycemic episodes. Was on glipizide daily and he has been having poor appetite. Likely cause of hypoglycemia in setting of sulfonylurea along with poor po intake. No further hypoglycemia after IV fluids.    Hyponatremia Has been hyponatremic for past few months likely in setting of poor po intake. Appears he was given salt tabs and tolvaptam at [facility]. AM cortisol normal. Serum osm was low 272, urine osm 236, urine Na <10 so suspect hypovolemia. Sodium has improved with IV fluids.    Anion gap metabolic acidosis AG 15 and bicarb  14 on admission, received IV fluids, with slight improvement AG 13 and bicarb 15. Likely in setting of poor po intake leading to ketoacidosis.   CKD 3b Creatinine and GFR appear to be at baseline.  Incidental adrenal mass Right lower lobe pulmonary nodule Adrenal nodule on CT imaging 10/03/22. Repeat CT during this admission showed unchanged nodule and likely benign adenoma without further f/u. CT a/p with irregular 5 mm pulmonary nodule on 1/19.    Hx of hypothyroidism TSH 7.508. Medication list includes levothyroxine but unclear if he has been able to take it daily.   Atrial fibrillation Patient on eliquis 2.5 mg BID and amiodarone 200 mg BID. Medications held given NPO status.    Hx of CAD s/p PCI Hx of HFpEF HTN Most recent EF 55-60%. Minimal LE edema noted on exam. Do not suspect CHF exacerbation at this time. Home medications were held given NPO status.    Discharge Subjective: Patient is awake. Denies any pain or discomfort. Has been able to take a few sips of water.   Discharge Exam:   BP 137/73 (BP Location: Left Arm)   Pulse (!) 57   Temp 97.8 F (36.6 C) (Oral)  Resp 16   Ht '5\' 9"'$  (1.753 m)   Wt 83.2 kg   SpO2 99%   BMI 27.09 kg/m  Constitutional: chronically ill appearing male lying in bed comfortably, in no acute distress Cardiovascular: regular rate  Pulmonary/Chest: normal work of breathing on room air MSK: normal bulk and tone Neurological: awake and alert Skin: dry skin with multiple ecchymosis noted on bilateral forearms and LE and right hip  Pertinent Labs, Studies, and Procedures:     Latest Ref Rng & Units 11/24/2022   12:57 AM 11/23/2022   10:39 AM 11/23/2022    9:29 AM  CBC  WBC 4.0 - 10.5 K/uL 9.2   10.4   Hemoglobin 13.0 - 17.0 g/dL 16.5  12.2  15.5   Hematocrit 39.0 - 52.0 % 46.5  36.0  45.0   Platelets 150 - 400 K/uL 183   254        Latest Ref Rng & Units 11/25/2022    1:01 AM 11/24/2022   12:57 AM 11/23/2022    6:04 PM  CMP  Glucose  70 - 99 mg/dL 128  104  51   BUN 8 - 23 mg/dL '7  9  11   '$ Creatinine 0.61 - 1.24 mg/dL 1.56  1.39  1.45   Sodium 135 - 145 mmol/L 133  131  132   Potassium 3.5 - 5.1 mmol/L 3.5  3.6  4.3   Chloride 98 - 111 mmol/L 105  101  101   CO2 22 - 32 mmol/L '15  17  14   '$ Calcium 8.9 - 10.3 mg/dL 7.7  8.6  8.5     DG ESOPHAGUS W SINGLE CM (SOL OR THIN BA)  Result Date: 11/24/2022 CLINICAL DATA:  Patient with complaints of dysphagia and regurgitation. EXAM: ESOPHAGUS/BARIUM SWALLOW TECHNIQUE: Single contrast examination was performed using thin liquid barium. This exam was performed by Soyla Dryer, NP, and was supervised and interpreted by Dr. Lin Landsman. FLUOROSCOPY: Radiation Exposure Index (as provided by the fluoroscopic device): 9.70 mGy Kerma COMPARISON:  Chest radiographs 11/23/2022.  Abdominal CT 11/23/2022. FINDINGS: Swallowing: No vestibular penetration or aspiration seen. Pharynx: Unremarkable. Esophagus: Study was performed in the supine and semi erect positions. Patient was only able to swallow small sips of barium. There is resulting limited assessment of the esophageal mucosa. No focal mucosal abnormalities are identified. Barium did pass into the proximal stomach. Esophageal motility: Severe dysmotility with long delay in transit of barium to the stomach. Hiatal Hernia: Limited study.  Unable to fully assess. Gastroesophageal reflux: None visualized. Ingested 45m barium tablet: Not given. Other: None. IMPRESSION: Very limited study with patient able to only complete one swallow of barium. Severe dysmotility with long delay in transit of barium into the stomach. Consider speech therapy evaluation. Read by: JSoyla Dryer NP Electronically Signed   By: WRichardean SaleM.D.   On: 11/24/2022 14:37   CT ABDOMEN PELVIS WO CONTRAST  Result Date: 11/23/2022 CLINICAL DATA:  Abdominal pain, acute, nonlocalized. EXAM: CT ABDOMEN AND PELVIS WITHOUT CONTRAST TECHNIQUE: Multidetector CT imaging of the  abdomen and pelvis was performed following the standard protocol without IV contrast. RADIATION DOSE REDUCTION: This exam was performed according to the departmental dose-optimization program which includes automated exposure control, adjustment of the mA and/or kV according to patient size and/or use of iterative reconstruction technique. COMPARISON:  CT abdomen and pelvis 10/03/2022 FINDINGS: Lower chest: New small right pleural effusion. Mild dependent atelectasis in both lower lobes. Extensive coronary atherosclerosis. Normal heart size. New  trace pericardial effusion. Hepatobiliary: Diffusely increased density of the liver parenchyma likely related to MA odor in therapy. No focal liver abnormality. Persistent layering hyperdense material in the gallbladder suggesting sludge. No pericholecystic inflammation or biliary dilatation. Pancreas: Unremarkable. Spleen: Unremarkable. Adrenals/Urinary Tract: Unremarkable right adrenal gland. Unchanged 1.3 cm left adrenal nodule with attenuation of approximately 10 HU, most compatible with a benign adenoma and with no follow-up imaging recommended. Vascular calcifications in the renal hila. No hydronephrosis. Asymmetric, severe left renal atrophy. Unchanged 1.2 cm hyperdense lesion anteriorly in the right kidney likely reflecting a hemorrhagic or proteinaceous cyst and unchanged low-density lesions in both kidneys measuring up to 3.2 cm on the left most compatible with cysts and for which no follow-up imaging is recommended. Unremarkable bladder. Stomach/Bowel: There is a small sliding hiatal hernia. Sequelae of previous colectomy are again identified with ileorectal anastomosis. A right-sided ventral upper abdominal wall hernia is again noted containing a loop of small bowel. A small rounded soft tissue density is again seen at the inferomedial aspect of the hernia sac without surrounding acute inflammatory changes. There is no evidence of bowel obstruction.  Vascular/Lymphatic: Extensive aortoiliac atherosclerosis. Bilateral common iliac artery stents. No enlarged lymph nodes. Reproductive: Unremarkable prostate. Other: No ascites or pneumoperitoneum. Musculoskeletal: No acute osseous abnormality or suspicious osseous lesion. Diffuse lumbar disc and facet degeneration. Partially visualized spinal cord stimulator. IMPRESSION: 1. No acute abnormality identified in the abdomen or pelvis. 2. New small right pleural effusion and trace pericardial effusion. 3. Unchanged right-sided ventral hernia containing a loop of small bowel. No evidence of bowel obstruction. 4.  Aortic Atherosclerosis (ICD10-I70.0). Electronically Signed   By: Logan Bores M.D.   On: 11/23/2022 15:14   CT Head Wo Contrast  Result Date: 11/23/2022 CLINICAL DATA:  Mental status change, unknown cause EXAM: CT HEAD WITHOUT CONTRAST TECHNIQUE: Contiguous axial images were obtained from the base of the skull through the vertex without intravenous contrast. RADIATION DOSE REDUCTION: This exam was performed according to the departmental dose-optimization program which includes automated exposure control, adjustment of the mA and/or kV according to patient size and/or use of iterative reconstruction technique. COMPARISON:  10/16/2022 FINDINGS: Brain: No evidence of acute infarction, hemorrhage, hydrocephalus, extra-axial collection or mass lesion/mass effect. Extensive low-density changes within the periventricular and subcortical white matter most compatible with chronic microvascular ischemic change. Mild-moderate diffuse cerebral volume loss. Vascular: Atherosclerotic calcifications involving the large vessels of the skull base. No unexpected hyperdense vessel. Skull: Normal. Negative for fracture or focal lesion. Sinuses/Orbits: New partial bilateral mastoid effusions. Paranasal sinuses are clear. Other: None. IMPRESSION: 1. No acute intracranial findings. 2. Chronic microvascular ischemic change and  cerebral volume loss. 3. New partial bilateral mastoid effusions. Electronically Signed   By: Davina Poke D.O.   On: 11/23/2022 11:37   DG Chest Port 1 View  Result Date: 11/23/2022 CLINICAL DATA:  Weakness. EXAM: PORTABLE CHEST 1 VIEW COMPARISON:  10/16/2022. FINDINGS: Low lung volumes accentuate the pulmonary vasculature and cardiomediastinal silhouette. Patchy left retrocardiac opacity is favored to represent subsegmental atelectasis. No pleural effusion or pneumothorax. Dorsal column stimulator electrodes project over the T6-T8 levels. IMPRESSION: Patchy left retrocardiac opacity is favored to represent subsegmental atelectasis given low lung volumes. Electronically Signed   By: Emmit Alexanders M.D.   On: 11/23/2022 09:59     Discharge Instructions: Discharge Instructions     No wound care   Complete by: As directed        Signed: Angelique Blonder, DO 11/26/2022, 2:08 PM  Pager: [phone]

## 2022-11-26 NOTE — TOC Initial Note (Signed)
Transition of Care (TOC) - Initial/Assessment Note  Marvetta Gibbons RN, BSN Transitions of Care Unit 4E- RN Case Manager See Treatment Team for direct phone #   Patient Details  Name: Albert Powell MRN: [id number] Date of Birth: 1941/04/16  Transition of Care [facility]) CM/SW Contact:    Dawayne Patricia, RN Phone Number: 11/26/2022, 10:11 AM  Clinical Narrative:                 Noted referral from [facility] for IP Hospice needs with family request for [facility].  Call has been made to [place] liaison this am- spoke w/ [facility] - referral is under review for IP Hospice at [facility].   Expected Discharge Plan: Hospice Medical Facility Barriers to Discharge: Other (must enter comment) (awaiting Hospice review)   Patient Goals and CMS Choice Patient states their goals for this hospitalization and ongoing recovery are:: Comfort care CMS Medicare.gov Compare Post Acute Care list provided to:: Patient Represenative (must comment) Choice offered to / list presented to : Adult Children      Expected Discharge Plan and Services In-house Referral: Clinical Social Work Discharge Planning Services: CM Consult Post Acute Care Choice: Hospice Living arrangements for the past 2 months: [place]                                      Prior Living Arrangements/Services Living arrangements for the past 2 months: [place] Lives with:: Facility Resident Patient language and need for interpreter reviewed:: Yes        Need for Family Participation in Patient Care: Yes (Comment) Care giver support system in place?: Yes (comment) Current home services: DME (rolling walker, cane, shower seat, glucometer, scale) Criminal Activity/Legal Involvement Pertinent to Current Situation/Hospitalization: No - Comment as needed  Activities of Daily Living      Permission Sought/Granted Permission sought to share information with : Facility Event organiser granted to share information with : Yes, Verbal Permission Granted     Permission granted to share info w AGENCY: Hospice        Emotional Assessment Appearance:: Appears stated age     Orientation: : Oriented to Self Alcohol / Substance Use: Not Applicable Psych Involvement: No (comment)  Admission diagnosis:  Hypoglycemia [E16.2] Patient Active Problem List   Diagnosis Date Noted   Pharyngoesophageal dysphagia 11/25/2022   Hypophosphatemia 11/24/2022   Decreased oral intake 11/24/2022   Malnutrition of moderate degree 11/24/2022   Hypoglycemia 11/23/2022   Generalized weakness 10/17/2022   Dyslipidemia 10/17/2022   Type 2 diabetes mellitus with chronic kidney disease, without long-term current use of insulin ([place]) 10/17/2022   Chronic atrial fibrillation with RVR ([place]) 10/17/2022   Hypothyroidism 10/17/2022   GERD without esophagitis 10/17/2022   Hyponatremia 10/03/2022   Constipation 10/03/2022   Proctitis 10/03/2022   HLD (hyperlipidemia) 10/03/2022   Stage 3b chronic kidney disease (CKD) ([place]) 10/03/2022   GIB (gastrointestinal bleeding) 10/03/2022   Nausea and vomiting 09/18/2022   (HFpEF) heart failure with preserved ejection fraction ([place]) 09/18/2022   AKI (acute kidney injury) ([place]) 09/06/2022   Chronic combined systolic and diastolic CHF (congestive heart failure) ([place]) 09/04/2022   Near syncope 09/04/2022   Hypokalemia 06/23/2022   Elevated troponin 06/23/2022   Type 2 diabetes mellitus with hyperlipidemia ([place]) 05/19/2022   Acute combined systolic and diastolic heart failure ([place])    Acute kidney injury superimposed  on chronic kidney disease ([place]) 05/13/2022   GERD (gastroesophageal reflux disease) 05/13/2022   Acquired hypothyroidism 05/13/2022   Paroxysmal atrial fibrillation ([place]) [id number]   Acute metabolic encephalopathy    CHF (congestive heart failure) ([place]) 03/10/2021   Essential hypertension 03/10/2021   Hypertensive  crisis 01/01/2021   ACS (acute coronary syndrome) ([place])    Lesion of right native kidney    Atrial fibrillation ([place]) 03/30/2020   Contusion of right hand 11/12/2018   Primary osteoarthritis of both first carpometacarpal joints 11/12/2018   Pain in joint of right shoulder 10/05/2018   Pain in right hand 10/05/2018   Hematoma 03/30/2018   Degeneration of lumbar intervertebral disc 12/21/2017   Lumbar radiculopathy 12/21/2017   Vertigo 04/09/2017   Hypogonadism in male 12/07/2014   Screening for prostate cancer 12/07/2014   Claudication of right lower extremity ([place]) 10/20/2013   Claudication in peripheral vascular disease- Rt leg 09/20/2013   Obesity (BMI 30-39.9)- negative sleep study in the past 09/20/2013   Occlusion and stenosis of carotid artery without mention of cerebral infarction 05/23/2013   PAD (peripheral artery disease) ([place]) 05/18/2013   Carotid artery disease ([place]) 04/14/2013   Bradycardia, sinus 02/18/2013   Tobacco abuse 02/05/2013   Chest pain, unstable angina, negative MI 02/04/2013   Coronary artery disease involving native coronary artery of native heart without angina pectoris 02/04/2013   Benign essential hypertension 07/10/2011   Incisional hernia 05/22/2011   PCP:  Patient, No Pcp Per Pharmacy:   [facility] - [place], [place] - [address] [place] [address] Phone: [phone] Fax: [phone]     Social Determinants of Health (SDOH) Social History: [place]: No Food Insecurity (10/03/2022)  Housing: Low Risk  (10/03/2022)  Transportation Needs: Unmet Transportation Needs (10/08/2022)  Utilities: Not At Risk (10/03/2022)  Alcohol Screen: Low Risk  (06/03/2022)  Depression (PHQ2-9): High Risk (08/14/2022)  Financial Resource Strain: Low Risk  (06/03/2022)  Tobacco Use: Medium Risk (11/23/2022)   SDOH Interventions:     Readmission Risk Interventions     No  data to display

## 2022-11-26 NOTE — Care Management Important Message (Signed)
Important Message  Patient Details  Name: DREON VACHA MRN: [id number] Date of Birth: 1940-12-18   Medicare Important Message Given:  Yes     Shelda Altes 11/26/2022, 8:39 AM

## 2022-12-05 ENCOUNTER — Ambulatory Visit (HOSPITAL_COMMUNITY): Payer: Medicare Other

## 2022-12-13 DIAGNOSIS — Z515 Encounter for palliative care: Secondary | ICD-10-CM | POA: Diagnosis not present

## 2022-12-13 DIAGNOSIS — R54 Age-related physical debility: Secondary | ICD-10-CM | POA: Diagnosis not present

## 2022-12-13 DIAGNOSIS — R296 Repeated falls: Secondary | ICD-10-CM | POA: Diagnosis not present

## 2022-12-15 DIAGNOSIS — E039 Hypothyroidism, unspecified: Secondary | ICD-10-CM | POA: Diagnosis not present

## 2022-12-15 DIAGNOSIS — K219 Gastro-esophageal reflux disease without esophagitis: Secondary | ICD-10-CM | POA: Diagnosis not present

## 2022-12-15 DIAGNOSIS — F0150 Vascular dementia without behavioral disturbance: Secondary | ICD-10-CM | POA: Diagnosis not present

## 2022-12-15 DIAGNOSIS — I69818 Other symptoms and signs involving cognitive functions following other cerebrovascular disease: Secondary | ICD-10-CM | POA: Diagnosis not present

## 2022-12-15 DIAGNOSIS — N1832 Chronic kidney disease, stage 3b: Secondary | ICD-10-CM | POA: Diagnosis not present

## 2022-12-15 DIAGNOSIS — K224 Dyskinesia of esophagus: Secondary | ICD-10-CM | POA: Diagnosis not present

## 2022-12-15 DIAGNOSIS — R1310 Dysphagia, unspecified: Secondary | ICD-10-CM | POA: Diagnosis not present

## 2022-12-15 DIAGNOSIS — E11649 Type 2 diabetes mellitus with hypoglycemia without coma: Secondary | ICD-10-CM | POA: Diagnosis not present

## 2022-12-15 DIAGNOSIS — E1122 Type 2 diabetes mellitus with diabetic chronic kidney disease: Secondary | ICD-10-CM | POA: Diagnosis not present

## 2022-12-15 DIAGNOSIS — I129 Hypertensive chronic kidney disease with stage 1 through stage 4 chronic kidney disease, or unspecified chronic kidney disease: Secondary | ICD-10-CM | POA: Diagnosis not present

## 2022-12-15 DIAGNOSIS — I4891 Unspecified atrial fibrillation: Secondary | ICD-10-CM | POA: Diagnosis not present

## 2022-12-15 DIAGNOSIS — J449 Chronic obstructive pulmonary disease, unspecified: Secondary | ICD-10-CM | POA: Diagnosis not present

## 2023-01-12 DIAGNOSIS — S41102A Unspecified open wound of left upper arm, initial encounter: Secondary | ICD-10-CM | POA: Diagnosis not present

## 2023-01-14 DIAGNOSIS — K219 Gastro-esophageal reflux disease without esophagitis: Secondary | ICD-10-CM | POA: Diagnosis not present

## 2023-01-14 DIAGNOSIS — R1310 Dysphagia, unspecified: Secondary | ICD-10-CM | POA: Diagnosis not present

## 2023-01-14 DIAGNOSIS — K224 Dyskinesia of esophagus: Secondary | ICD-10-CM | POA: Diagnosis not present

## 2023-01-14 DIAGNOSIS — F0150 Vascular dementia without behavioral disturbance: Secondary | ICD-10-CM | POA: Diagnosis not present

## 2023-01-14 DIAGNOSIS — N1832 Chronic kidney disease, stage 3b: Secondary | ICD-10-CM | POA: Diagnosis not present

## 2023-01-14 DIAGNOSIS — I69818 Other symptoms and signs involving cognitive functions following other cerebrovascular disease: Secondary | ICD-10-CM | POA: Diagnosis not present

## 2023-01-14 DIAGNOSIS — I4891 Unspecified atrial fibrillation: Secondary | ICD-10-CM | POA: Diagnosis not present

## 2023-01-14 DIAGNOSIS — E039 Hypothyroidism, unspecified: Secondary | ICD-10-CM | POA: Diagnosis not present

## 2023-01-14 DIAGNOSIS — J449 Chronic obstructive pulmonary disease, unspecified: Secondary | ICD-10-CM | POA: Diagnosis not present

## 2023-01-14 DIAGNOSIS — E11649 Type 2 diabetes mellitus with hypoglycemia without coma: Secondary | ICD-10-CM | POA: Diagnosis not present

## 2023-01-14 DIAGNOSIS — E1122 Type 2 diabetes mellitus with diabetic chronic kidney disease: Secondary | ICD-10-CM | POA: Diagnosis not present

## 2023-01-14 DIAGNOSIS — I129 Hypertensive chronic kidney disease with stage 1 through stage 4 chronic kidney disease, or unspecified chronic kidney disease: Secondary | ICD-10-CM | POA: Diagnosis not present

## 2023-01-19 DIAGNOSIS — S41102A Unspecified open wound of left upper arm, initial encounter: Secondary | ICD-10-CM | POA: Diagnosis not present

## 2023-01-26 DIAGNOSIS — S41102A Unspecified open wound of left upper arm, initial encounter: Secondary | ICD-10-CM | POA: Diagnosis not present

## 2023-01-30 ENCOUNTER — Other Ambulatory Visit (HOSPITAL_COMMUNITY): Payer: Self-pay | Admitting: Cardiology

## 2023-02-02 DIAGNOSIS — S41102A Unspecified open wound of left upper arm, initial encounter: Secondary | ICD-10-CM | POA: Diagnosis not present

## 2023-02-18 ENCOUNTER — Telehealth (HOSPITAL_COMMUNITY): Payer: Self-pay

## 2023-02-18 NOTE — Telephone Encounter (Signed)
Patient called. Voice message left to give our office a call back about sending in a transmission of his Cardiomems device.

## 2023-04-14 NOTE — Telephone Encounter (Signed)
error
